# Patient Record
Sex: Female | Born: 1965 | Race: White | Hispanic: No | Smoking: Former smoker
Health system: Southern US, Community
[De-identification: ages and names within clinical notes are randomized; demographics above are authoritative.]

## PROBLEM LIST (undated history)

## (undated) DIAGNOSIS — K641 Second degree hemorrhoids: Secondary | ICD-10-CM

## (undated) DIAGNOSIS — E119 Type 2 diabetes mellitus without complications: Secondary | ICD-10-CM

## (undated) DIAGNOSIS — K589 Irritable bowel syndrome without diarrhea: Secondary | ICD-10-CM

## (undated) DIAGNOSIS — F419 Anxiety disorder, unspecified: Secondary | ICD-10-CM

## (undated) DIAGNOSIS — E559 Vitamin D deficiency, unspecified: Secondary | ICD-10-CM

## (undated) DIAGNOSIS — M25471 Effusion, right ankle: Secondary | ICD-10-CM

## (undated) DIAGNOSIS — M25475 Effusion, left foot: Secondary | ICD-10-CM

## (undated) DIAGNOSIS — E785 Hyperlipidemia, unspecified: Secondary | ICD-10-CM

## (undated) DIAGNOSIS — Z98811 Dental restoration status: Secondary | ICD-10-CM

## (undated) DIAGNOSIS — F411 Generalized anxiety disorder: Secondary | ICD-10-CM

## (undated) DIAGNOSIS — Z91018 Allergy to other foods: Secondary | ICD-10-CM

## (undated) DIAGNOSIS — M2550 Pain in unspecified joint: Secondary | ICD-10-CM

## (undated) DIAGNOSIS — M1990 Unspecified osteoarthritis, unspecified site: Secondary | ICD-10-CM

## (undated) DIAGNOSIS — K7469 Other cirrhosis of liver: Secondary | ICD-10-CM

## (undated) DIAGNOSIS — R110 Nausea: Secondary | ICD-10-CM

## (undated) DIAGNOSIS — K2970 Gastritis, unspecified, without bleeding: Secondary | ICD-10-CM

## (undated) DIAGNOSIS — G500 Trigeminal neuralgia: Secondary | ICD-10-CM

## (undated) DIAGNOSIS — E7800 Pure hypercholesterolemia, unspecified: Secondary | ICD-10-CM

## (undated) DIAGNOSIS — K219 Gastro-esophageal reflux disease without esophagitis: Secondary | ICD-10-CM

## (undated) DIAGNOSIS — R0602 Shortness of breath: Secondary | ICD-10-CM

## (undated) DIAGNOSIS — I10 Essential (primary) hypertension: Secondary | ICD-10-CM

## (undated) DIAGNOSIS — D369 Benign neoplasm, unspecified site: Secondary | ICD-10-CM

## (undated) DIAGNOSIS — R197 Diarrhea, unspecified: Secondary | ICD-10-CM

## (undated) DIAGNOSIS — K7581 Nonalcoholic steatohepatitis (NASH): Secondary | ICD-10-CM

## (undated) DIAGNOSIS — E282 Polycystic ovarian syndrome: Secondary | ICD-10-CM

## (undated) DIAGNOSIS — K5730 Diverticulosis of large intestine without perforation or abscess without bleeding: Secondary | ICD-10-CM

## (undated) DIAGNOSIS — E039 Hypothyroidism, unspecified: Secondary | ICD-10-CM

## (undated) DIAGNOSIS — Z87442 Personal history of urinary calculi: Secondary | ICD-10-CM

## (undated) DIAGNOSIS — T7840XA Allergy, unspecified, initial encounter: Secondary | ICD-10-CM

## (undated) DIAGNOSIS — Z8659 Personal history of other mental and behavioral disorders: Secondary | ICD-10-CM

## (undated) DIAGNOSIS — F603 Borderline personality disorder: Secondary | ICD-10-CM

## (undated) DIAGNOSIS — K5900 Constipation, unspecified: Secondary | ICD-10-CM

## (undated) DIAGNOSIS — K7460 Unspecified cirrhosis of liver: Secondary | ICD-10-CM

## (undated) DIAGNOSIS — F332 Major depressive disorder, recurrent severe without psychotic features: Secondary | ICD-10-CM

## (undated) DIAGNOSIS — C801 Malignant (primary) neoplasm, unspecified: Secondary | ICD-10-CM

## (undated) DIAGNOSIS — K0889 Other specified disorders of teeth and supporting structures: Secondary | ICD-10-CM

## (undated) DIAGNOSIS — R112 Nausea with vomiting, unspecified: Secondary | ICD-10-CM

## (undated) DIAGNOSIS — F319 Bipolar disorder, unspecified: Secondary | ICD-10-CM

## (undated) DIAGNOSIS — Z8669 Personal history of other diseases of the nervous system and sense organs: Secondary | ICD-10-CM

## (undated) DIAGNOSIS — N979 Female infertility, unspecified: Secondary | ICD-10-CM

## (undated) DIAGNOSIS — G4730 Sleep apnea, unspecified: Secondary | ICD-10-CM

## (undated) DIAGNOSIS — R002 Palpitations: Secondary | ICD-10-CM

## (undated) DIAGNOSIS — F329 Major depressive disorder, single episode, unspecified: Secondary | ICD-10-CM

## (undated) DIAGNOSIS — L439 Lichen planus, unspecified: Secondary | ICD-10-CM

## (undated) DIAGNOSIS — Z9889 Other specified postprocedural states: Secondary | ICD-10-CM

## (undated) DIAGNOSIS — K760 Fatty (change of) liver, not elsewhere classified: Secondary | ICD-10-CM

## (undated) DIAGNOSIS — M549 Dorsalgia, unspecified: Secondary | ICD-10-CM

## (undated) DIAGNOSIS — R5383 Other fatigue: Secondary | ICD-10-CM

## (undated) DIAGNOSIS — M25474 Effusion, right foot: Secondary | ICD-10-CM

## (undated) DIAGNOSIS — D649 Anemia, unspecified: Secondary | ICD-10-CM

## (undated) DIAGNOSIS — M25472 Effusion, left ankle: Secondary | ICD-10-CM

## (undated) DIAGNOSIS — F32A Depression, unspecified: Secondary | ICD-10-CM

## (undated) HISTORY — DX: Allergy, unspecified, initial encounter: T78.40XA

## (undated) HISTORY — DX: Female infertility, unspecified: N97.9

## (undated) HISTORY — DX: Nonalcoholic steatohepatitis (NASH): K75.81

## (undated) HISTORY — DX: Generalized anxiety disorder: F41.1

## (undated) HISTORY — DX: Diverticulosis of large intestine without perforation or abscess without bleeding: K57.30

## (undated) HISTORY — DX: Shortness of breath: R06.02

## (undated) HISTORY — DX: Gastritis, unspecified, without bleeding: K29.70

## (undated) HISTORY — DX: Lichen planus, unspecified: L43.9

## (undated) HISTORY — DX: Vitamin D deficiency, unspecified: E55.9

## (undated) HISTORY — DX: Unspecified osteoarthritis, unspecified site: M19.90

## (undated) HISTORY — DX: Hypothyroidism, unspecified: E03.9

## (undated) HISTORY — DX: Polycystic ovarian syndrome: E28.2

## (undated) HISTORY — DX: Effusion, left ankle: M25.472

## (undated) HISTORY — DX: Diarrhea, unspecified: R19.7

## (undated) HISTORY — DX: Malignant (primary) neoplasm, unspecified: C80.1

## (undated) HISTORY — DX: Other cirrhosis of liver: K74.69

## (undated) HISTORY — DX: Dorsalgia, unspecified: M54.9

## (undated) HISTORY — DX: Fatty (change of) liver, not elsewhere classified: K76.0

## (undated) HISTORY — DX: Effusion, left foot: M25.475

## (undated) HISTORY — DX: Type 2 diabetes mellitus without complications: E11.9

## (undated) HISTORY — DX: Pain in unspecified joint: M25.50

## (undated) HISTORY — DX: Major depressive disorder, recurrent severe without psychotic features: F33.2

## (undated) HISTORY — DX: Borderline personality disorder: F60.3

## (undated) HISTORY — DX: Other fatigue: R53.83

## (undated) HISTORY — DX: Effusion, right ankle: M25.471

## (undated) HISTORY — PX: APPENDECTOMY: SHX54

## (undated) HISTORY — DX: Constipation, unspecified: K59.00

## (undated) HISTORY — DX: Other specified disorders of teeth and supporting structures: K08.89

## (undated) HISTORY — DX: Gastro-esophageal reflux disease without esophagitis: K21.9

## (undated) HISTORY — DX: Anemia, unspecified: D64.9

## (undated) HISTORY — DX: Nausea: R11.0

## (undated) HISTORY — DX: Second degree hemorrhoids: K64.1

## (undated) HISTORY — DX: Effusion, right foot: M25.474

## (undated) HISTORY — DX: Benign neoplasm, unspecified site: D36.9

## (undated) HISTORY — PX: JOINT REPLACEMENT: SHX530

## (undated) HISTORY — DX: Trigeminal neuralgia: G50.0

## (undated) HISTORY — DX: Irritable bowel syndrome, unspecified: K58.9

## (undated) HISTORY — DX: Allergy to other foods: Z91.018

## (undated) HISTORY — DX: Hyperlipidemia, unspecified: E78.5

## (undated) HISTORY — PX: OTHER SURGICAL HISTORY: SHX169

## (undated) HISTORY — PX: SPINE SURGERY: SHX786

## (undated) HISTORY — PX: RIGHT OOPHORECTOMY: SHX2359

## (undated) HISTORY — PX: LUMBAR LAMINECTOMY: SHX95

## (undated) HISTORY — DX: Palpitations: R00.2

## (undated) HISTORY — DX: Unspecified cirrhosis of liver: K74.60

## (undated) HISTORY — DX: Personal history of other mental and behavioral disorders: Z86.59

---
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
## 1992-08-05 HISTORY — PX: PILONIDAL CYST EXCISION: SHX744

## 1996-08-05 HISTORY — PX: OVARIAN CYST SURGERY: SHX726

## 1997-12-28 ENCOUNTER — Ambulatory Visit (HOSPITAL_COMMUNITY): Admission: RE | Admit: 1997-12-28 | Discharge: 1997-12-28 | Payer: Self-pay | Admitting: Interventional Cardiology

## 1998-04-20 ENCOUNTER — Emergency Department (HOSPITAL_COMMUNITY): Admission: EM | Admit: 1998-04-20 | Discharge: 1998-04-20 | Payer: Self-pay | Admitting: Emergency Medicine

## 1998-04-20 ENCOUNTER — Encounter: Payer: Self-pay | Admitting: Emergency Medicine

## 1999-03-03 ENCOUNTER — Emergency Department (HOSPITAL_COMMUNITY): Admission: EM | Admit: 1999-03-03 | Discharge: 1999-03-03 | Payer: Self-pay | Admitting: Emergency Medicine

## 1999-03-03 ENCOUNTER — Encounter: Payer: Self-pay | Admitting: Emergency Medicine

## 1999-04-19 ENCOUNTER — Other Ambulatory Visit: Admission: RE | Admit: 1999-04-19 | Discharge: 1999-04-19 | Payer: Self-pay | Admitting: Obstetrics and Gynecology

## 1999-07-07 ENCOUNTER — Encounter: Payer: Self-pay | Admitting: Emergency Medicine

## 1999-07-07 ENCOUNTER — Emergency Department (HOSPITAL_COMMUNITY): Admission: EM | Admit: 1999-07-07 | Discharge: 1999-07-07 | Payer: Self-pay | Admitting: Emergency Medicine

## 1999-09-05 ENCOUNTER — Inpatient Hospital Stay (HOSPITAL_COMMUNITY): Admission: RE | Admit: 1999-09-05 | Discharge: 1999-09-07 | Payer: Self-pay | Admitting: Obstetrics and Gynecology

## 1999-09-05 ENCOUNTER — Encounter (INDEPENDENT_AMBULATORY_CARE_PROVIDER_SITE_OTHER): Payer: Self-pay | Admitting: Specialist

## 2000-10-10 ENCOUNTER — Other Ambulatory Visit: Admission: RE | Admit: 2000-10-10 | Discharge: 2000-10-10 | Payer: Self-pay | Admitting: Obstetrics and Gynecology

## 2001-07-31 ENCOUNTER — Encounter: Payer: Self-pay | Admitting: Family Medicine

## 2001-07-31 ENCOUNTER — Ambulatory Visit (HOSPITAL_COMMUNITY): Admission: RE | Admit: 2001-07-31 | Discharge: 2001-07-31 | Payer: Self-pay | Admitting: Family Medicine

## 2001-08-05 HISTORY — PX: TOTAL THYROIDECTOMY: SHX2547

## 2002-02-19 ENCOUNTER — Ambulatory Visit (HOSPITAL_COMMUNITY): Admission: RE | Admit: 2002-02-19 | Discharge: 2002-02-19 | Payer: Self-pay | Admitting: Endocrinology

## 2002-02-19 ENCOUNTER — Encounter: Payer: Self-pay | Admitting: Endocrinology

## 2002-03-03 ENCOUNTER — Encounter (INDEPENDENT_AMBULATORY_CARE_PROVIDER_SITE_OTHER): Payer: Self-pay | Admitting: *Deleted

## 2002-03-03 ENCOUNTER — Ambulatory Visit (HOSPITAL_COMMUNITY): Admission: RE | Admit: 2002-03-03 | Discharge: 2002-03-03 | Payer: Self-pay | Admitting: Endocrinology

## 2002-03-03 ENCOUNTER — Encounter: Payer: Self-pay | Admitting: Endocrinology

## 2002-04-28 ENCOUNTER — Encounter: Payer: Self-pay | Admitting: Emergency Medicine

## 2002-04-29 ENCOUNTER — Encounter: Payer: Self-pay | Admitting: Emergency Medicine

## 2002-04-29 ENCOUNTER — Inpatient Hospital Stay (HOSPITAL_COMMUNITY): Admission: EM | Admit: 2002-04-29 | Discharge: 2002-04-29 | Payer: Self-pay | Admitting: Emergency Medicine

## 2002-07-13 ENCOUNTER — Other Ambulatory Visit (HOSPITAL_COMMUNITY): Admission: RE | Admit: 2002-07-13 | Discharge: 2002-07-14 | Payer: Self-pay | Admitting: Psychiatry

## 2002-09-02 ENCOUNTER — Encounter: Payer: Self-pay | Admitting: *Deleted

## 2002-09-02 ENCOUNTER — Encounter: Admission: RE | Admit: 2002-09-02 | Discharge: 2002-09-02 | Payer: Self-pay | Admitting: *Deleted

## 2003-02-01 ENCOUNTER — Other Ambulatory Visit: Admission: RE | Admit: 2003-02-01 | Discharge: 2003-02-01 | Payer: Self-pay | Admitting: Obstetrics and Gynecology

## 2003-08-03 ENCOUNTER — Ambulatory Visit (HOSPITAL_COMMUNITY): Admission: RE | Admit: 2003-08-03 | Discharge: 2003-08-03 | Payer: Self-pay | Admitting: Obstetrics and Gynecology

## 2003-08-13 ENCOUNTER — Emergency Department (HOSPITAL_COMMUNITY): Admission: EM | Admit: 2003-08-13 | Discharge: 2003-08-13 | Payer: Self-pay | Admitting: Emergency Medicine

## 2004-02-02 ENCOUNTER — Other Ambulatory Visit: Admission: RE | Admit: 2004-02-02 | Discharge: 2004-02-02 | Payer: Self-pay | Admitting: Obstetrics and Gynecology

## 2005-05-21 ENCOUNTER — Encounter: Admission: RE | Admit: 2005-05-21 | Discharge: 2005-05-21 | Payer: Self-pay | Admitting: Obstetrics and Gynecology

## 2005-08-05 HISTORY — PX: ACHILLES TENDON SURGERY: SHX542

## 2006-02-13 ENCOUNTER — Emergency Department (HOSPITAL_COMMUNITY): Admission: EM | Admit: 2006-02-13 | Discharge: 2006-02-13 | Payer: Self-pay | Admitting: Emergency Medicine

## 2006-05-23 ENCOUNTER — Encounter: Admission: RE | Admit: 2006-05-23 | Discharge: 2006-05-23 | Payer: Self-pay | Admitting: *Deleted

## 2006-05-28 ENCOUNTER — Other Ambulatory Visit: Admission: RE | Admit: 2006-05-28 | Discharge: 2006-05-28 | Payer: Self-pay | Admitting: Obstetrics and Gynecology

## 2007-05-25 ENCOUNTER — Encounter: Admission: RE | Admit: 2007-05-25 | Discharge: 2007-05-25 | Payer: Self-pay | Admitting: Obstetrics and Gynecology

## 2007-06-25 ENCOUNTER — Other Ambulatory Visit: Admission: RE | Admit: 2007-06-25 | Discharge: 2007-06-25 | Payer: Self-pay | Admitting: Obstetrics and Gynecology

## 2007-07-25 ENCOUNTER — Emergency Department (HOSPITAL_COMMUNITY): Admission: EM | Admit: 2007-07-25 | Discharge: 2007-07-25 | Payer: Self-pay | Admitting: Family Medicine

## 2007-08-27 ENCOUNTER — Encounter: Payer: Self-pay | Admitting: Endocrinology

## 2007-09-01 ENCOUNTER — Encounter: Payer: Self-pay | Admitting: Endocrinology

## 2007-09-21 ENCOUNTER — Encounter: Admission: RE | Admit: 2007-09-21 | Discharge: 2007-12-08 | Payer: Self-pay | Admitting: Family Medicine

## 2007-10-02 ENCOUNTER — Ambulatory Visit: Payer: Self-pay | Admitting: Endocrinology

## 2007-10-02 DIAGNOSIS — G43909 Migraine, unspecified, not intractable, without status migrainosus: Secondary | ICD-10-CM | POA: Insufficient documentation

## 2007-10-02 DIAGNOSIS — Z87442 Personal history of urinary calculi: Secondary | ICD-10-CM

## 2007-10-02 DIAGNOSIS — I10 Essential (primary) hypertension: Secondary | ICD-10-CM | POA: Insufficient documentation

## 2007-10-02 DIAGNOSIS — E890 Postprocedural hypothyroidism: Secondary | ICD-10-CM | POA: Insufficient documentation

## 2007-10-02 DIAGNOSIS — R7309 Other abnormal glucose: Secondary | ICD-10-CM | POA: Insufficient documentation

## 2007-10-02 DIAGNOSIS — Z87448 Personal history of other diseases of urinary system: Secondary | ICD-10-CM | POA: Insufficient documentation

## 2007-10-02 DIAGNOSIS — E282 Polycystic ovarian syndrome: Secondary | ICD-10-CM

## 2007-10-02 DIAGNOSIS — E7800 Pure hypercholesterolemia, unspecified: Secondary | ICD-10-CM | POA: Insufficient documentation

## 2007-10-02 DIAGNOSIS — F329 Major depressive disorder, single episode, unspecified: Secondary | ICD-10-CM

## 2007-10-02 HISTORY — DX: Essential (primary) hypertension: I10

## 2007-10-02 HISTORY — DX: Personal history of urinary calculi: Z87.442

## 2007-10-02 HISTORY — DX: Postprocedural hypothyroidism: E89.0

## 2007-10-02 HISTORY — DX: Migraine, unspecified, not intractable, without status migrainosus: G43.909

## 2007-10-02 HISTORY — DX: Major depressive disorder, single episode, unspecified: F32.9

## 2007-10-02 HISTORY — DX: Polycystic ovarian syndrome: E28.2

## 2007-10-08 ENCOUNTER — Encounter: Payer: Self-pay | Admitting: Endocrinology

## 2007-10-23 ENCOUNTER — Ambulatory Visit: Payer: Self-pay | Admitting: Endocrinology

## 2007-10-26 LAB — CONVERTED CEMR LAB
Hgb A1c MFr Bld: 5.1 % (ref 4.6–6.0)
TSH: 0.27 microintl units/mL — ABNORMAL LOW (ref 0.35–5.50)

## 2007-12-13 ENCOUNTER — Emergency Department (HOSPITAL_COMMUNITY): Admission: EM | Admit: 2007-12-13 | Discharge: 2007-12-13 | Payer: Self-pay | Admitting: Emergency Medicine

## 2008-01-12 ENCOUNTER — Telehealth: Payer: Self-pay | Admitting: Endocrinology

## 2008-06-27 ENCOUNTER — Encounter: Admission: RE | Admit: 2008-06-27 | Discharge: 2008-06-27 | Payer: Self-pay | Admitting: Obstetrics and Gynecology

## 2008-07-12 ENCOUNTER — Ambulatory Visit: Payer: Self-pay | Admitting: Obstetrics and Gynecology

## 2008-07-12 ENCOUNTER — Encounter: Payer: Self-pay | Admitting: Obstetrics and Gynecology

## 2008-07-12 ENCOUNTER — Other Ambulatory Visit: Admission: RE | Admit: 2008-07-12 | Discharge: 2008-07-12 | Payer: Self-pay | Admitting: Obstetrics and Gynecology

## 2008-11-08 ENCOUNTER — Telehealth: Payer: Self-pay | Admitting: Endocrinology

## 2009-02-01 ENCOUNTER — Telehealth: Payer: Self-pay | Admitting: Endocrinology

## 2009-02-16 ENCOUNTER — Ambulatory Visit: Payer: Self-pay | Admitting: Endocrinology

## 2009-02-16 LAB — CONVERTED CEMR LAB: TSH: 0.38 microintl units/mL (ref 0.35–5.50)

## 2009-02-20 ENCOUNTER — Telehealth: Payer: Self-pay | Admitting: Endocrinology

## 2009-06-12 ENCOUNTER — Telehealth: Payer: Self-pay | Admitting: Endocrinology

## 2010-02-07 ENCOUNTER — Ambulatory Visit: Payer: Self-pay | Admitting: Endocrinology

## 2010-02-08 ENCOUNTER — Ambulatory Visit: Payer: Self-pay | Admitting: Endocrinology

## 2010-02-08 DIAGNOSIS — Z87891 Personal history of nicotine dependence: Secondary | ICD-10-CM

## 2010-02-08 HISTORY — DX: Personal history of nicotine dependence: Z87.891

## 2010-02-20 ENCOUNTER — Observation Stay (HOSPITAL_COMMUNITY): Admission: EM | Admit: 2010-02-20 | Discharge: 2010-02-21 | Payer: Self-pay | Admitting: Emergency Medicine

## 2010-02-20 ENCOUNTER — Ambulatory Visit: Payer: Self-pay | Admitting: Cardiology

## 2010-02-21 ENCOUNTER — Encounter (INDEPENDENT_AMBULATORY_CARE_PROVIDER_SITE_OTHER): Payer: Self-pay | Admitting: Emergency Medicine

## 2010-03-30 ENCOUNTER — Telehealth: Payer: Self-pay | Admitting: Endocrinology

## 2010-06-01 ENCOUNTER — Encounter: Admission: RE | Admit: 2010-06-01 | Discharge: 2010-06-01 | Payer: Self-pay | Admitting: Obstetrics and Gynecology

## 2010-07-02 ENCOUNTER — Other Ambulatory Visit: Admission: RE | Admit: 2010-07-02 | Discharge: 2010-07-02 | Payer: Self-pay | Admitting: Obstetrics and Gynecology

## 2010-07-02 ENCOUNTER — Ambulatory Visit: Payer: Self-pay | Admitting: Obstetrics and Gynecology

## 2010-08-17 ENCOUNTER — Inpatient Hospital Stay: Payer: Self-pay | Admitting: Unknown Physician Specialty

## 2010-08-26 ENCOUNTER — Encounter: Payer: Self-pay | Admitting: Obstetrics and Gynecology

## 2010-08-27 ENCOUNTER — Ambulatory Visit: Payer: Self-pay | Admitting: Unknown Physician Specialty

## 2010-09-05 ENCOUNTER — Ambulatory Visit: Payer: Self-pay | Admitting: Unknown Physician Specialty

## 2010-09-06 NOTE — Progress Notes (Signed)
Summary: metformin  Phone Note Refill Request Message from:  Fax from Pharmacy on March 30, 2010 8:29 AM  Refills Requested: Medication #1:  METFORMIN HCL 750 MG  TB24 take 1 by mouth qd   Dosage confirmed as above?Dosage Confirmed  Method Requested: Electronic Initial call taken by: Brenton Grills MA,  March 30, 2010 8:29 AM    Prescriptions: METFORMIN HCL 750 MG  TB24 (METFORMIN HCL) take 1 by mouth qd  #90 x 1   Entered by:   Brenton Grills MA   Authorized by:   Minus Breeding MD   Signed by:   Brenton Grills MA on 03/30/2010   Method used:   Electronically to        [facility]* (retail)       [address]       [address] Shipping/mailing       [place], [place]  [address]       Ph: [phone]       Fax: [phone]   RxID:   [id number]

## 2010-09-06 NOTE — Assessment & Plan Note (Signed)
Summary: follow up-lb   Vital Signs:  Patient profile:   45 year old female Height:      71 inches (180.34 cm) Weight:      325.75 pounds (148.07 kg) BMI:     45.60 O2 Sat:      97 % on Room air Temp:     98.8 degrees F (37.11 degrees C) oral Pulse rate:   73 / minute BP sitting:   94 / 62  (left arm) Cuff size:   large  Vitals Entered By: Brenton Grills MA (February 08, 2010 8:13 AM)  O2 Flow:  Room air CC: yearly f/u/hypothyroid/pt states she is no longer taking amlodipine/aj   Primary Provider:  Dr Octaviano Glow  CC:  yearly f/u/hypothyroid/pt states she is no longer taking amlodipine/aj.  History of Present Illness: the status of at least 3 ongoing medical problems is addressed today: hypothyroidism:  pt states she feels well in general, and takes the synthroid as rx'ed. htn:  since she stopped norvasc, she denies dizziness. hyperglycemia:  no nausea with metformin.  she has quit smoking  Current Medications (verified): 1)  Amlodipine Besylate 10 Mg  Tabs (Amlodipine Besylate) .... Take 1 By Mouth Qd 2)  Synthroid 137 Mcg  Tabs (Levothyroxine Sodium) .... Take 2 By Mouth Qd 3)  Metformin Hcl 750 Mg  Tb24 (Metformin Hcl) .... Take 1 By Mouth Qd 4)  Alprazolam 0.25 Mg  Tabs (Alprazolam) .... Take 1 By Mouth Three Times A Day Prn 5)  Lisinopril-Hydrochlorothiazide 20-12.5 Mg Tabs (Lisinopril-Hydrochlorothiazide) .Marland Kitchen.. 1 Tab Qd 6)  Pristiq 100 Mg Xr24h-Tab (Desvenlafaxine Succinate) .Marland Kitchen.. 1 Tab Qam 7)  Lithium Carbonate 450 Mg Cr-Tabs (Lithium Carbonate) .Marland Kitchen.. 1 Tablet By Mouth Two Times A Day 8)  Abilify 5 Mg Tabs (Aripiprazole) .Marland Kitchen.. 1 Tablet By Mouth At Bedtime 9)  Zantac 150 Mg Tabs (Ranitidine Hcl) .... Take 1 Tablet By Mouth Every Morning 10)  Aspirin 81 Mg Tabs (Aspirin) .Marland Kitchen.. 1 Tablet By Mouth Every Morning  Allergies (verified): 1)  ! Codeine 2)  ! Wellbutrin 3)  ! 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         she says lithium is driving weight gain.  Physical Exam  General:  morbidly obese.  no distress  Neck:  a healed scar is present.  i do not appreciate a nodule in the thyroid or elsewhere in the neck  Skin:  moderate hirsutism on the face Additional Exam:  FastTSH                   1.10 uIU/mL                 0.35-5.50 Hemoglobin A1C            5.4 %      Impression & Recommendations:  Problem # 1:  HYPOTHYROIDISM, POSTSURGICAL (ICD-244.0) after thyroidectomy at [facility] in 2003--benign well-replaced  Problem # 2:  HYPERTENSION (ICD-401.9) overcontrolled  Problem # 3:  HYPERGLYCEMIA (ICD-790.29) Assessment: Unchanged  Medications Added to Medication List This Visit: 1)  Lisinopril-hydrochlorothiazide 20-12.5 Mg Tabs (Lisinopril-hydrochlorothiazide) .... 1/2 tab once daily 2)  Lithium Carbonate 450 Mg Cr-tabs (Lithium carbonate) .Marland Kitchen.. 1 tablet by mouth two times a day 3)  Abilify 5 Mg Tabs (Aripiprazole) .Marland Kitchen.. 1 tablet by mouth at bedtime 4)  Zantac 150 Mg Tabs (Ranitidine hcl) .... Take 1 tablet by mouth every morning 5)  Aspirin 81 Mg Tabs (Aspirin) .Marland Kitchen.. 1 tablet by mouth every morning  Other Orders: Surgical Referral (Surgery) TLB-TSH (Thyroid Stimulating Hormone) (84443-TSH) TLB-A1C / Hgb A1C (Glycohemoglobin) (83036-A1C) Est. Patient Level IV ([address])  Patient Instructions: 1)  reduce lisinopril-hctz to 1/2 pill per day. 2)  please see dr sun in a few weeks to follow-up your blood pressure. 3)  blood tests are being ordered for you today.  please call [phone] to hear your test results. 4)  Congratulations on quitting smoking!. 5)  Please schedule a follow-up  appointment in 1 year. 6)  you should consider weight-loss surgery.  please let me know if you wish to be referred for an informational meeting. 7)  cc dr Evelene Croon 8)  (update: i left message on phone-tree:  rx as we discussed) Prescriptions: LISINOPRIL-HYDROCHLOROTHIAZIDE 20-12.5 MG TABS (LISINOPRIL-HYDROCHLOROTHIAZIDE) 1/2 tab once daily  #30 x 2   Entered and Authorized by:   Minus Breeding MD   Signed by:   Minus Breeding MD on 02/08/2010   Method used:   Electronically to        [facility]* (retail)       [address]       [address] Shipping/mailing       [place], [place]  [address]       Ph: [phone]       Fax: [phone]   RxID:   [id number]

## 2010-10-20 LAB — POCT I-STAT, CHEM 8
BUN: 11 mg/dL (ref 6–23)
Calcium, Ion: 1.12 mmol/L (ref 1.12–1.32)
Creatinine, Ser: 0.8 mg/dL (ref 0.4–1.2)
Glucose, Bld: 77 mg/dL (ref 70–99)
TCO2: 25 mmol/L (ref 0–100)

## 2010-10-20 LAB — POCT CARDIAC MARKERS
CKMB, poc: 1.8 ng/mL (ref 1.0–8.0)
Myoglobin, poc: 107 ng/mL (ref 12–200)
Myoglobin, poc: 84.9 ng/mL (ref 12–200)
Troponin i, poc: <0.05 ng/mL (ref 0.00–0.09)

## 2010-10-20 LAB — DIFFERENTIAL
Basophils Relative: 0 % (ref 0–1)
Eosinophils Absolute: 0.3 10*3/uL (ref 0.0–0.7)
Eosinophils Relative: 4 % (ref 0–5)
Monocytes Absolute: 0.5 10*3/uL (ref 0.1–1.0)
Monocytes Relative: 6 % (ref 3–12)

## 2010-10-20 LAB — CBC
HCT: 40.7 % (ref 36.0–46.0)
Hemoglobin: 13.9 g/dL (ref 12.0–15.0)
MCH: 30.7 pg (ref 26.0–34.0)
MCHC: 34.1 g/dL (ref 30.0–36.0)
RDW: 15.2 % (ref 11.5–15.5)

## 2010-11-06 ENCOUNTER — Other Ambulatory Visit: Payer: Self-pay | Admitting: Endocrinology

## 2010-11-06 DIAGNOSIS — E890 Postprocedural hypothyroidism: Secondary | ICD-10-CM

## 2010-11-06 MED ORDER — LEVOTHYROXINE SODIUM 137 MCG PO TABS: ORAL_TABLET | ORAL | Status: DC

## 2010-11-06 NOTE — Telephone Encounter (Signed)
R'cd fax from [facility] for refill for pt's Levothyroxine.  Last OV-02/08/2010  Last Filled: 07/25/2010

## 2010-12-11 ENCOUNTER — Other Ambulatory Visit: Payer: Self-pay | Admitting: *Deleted

## 2010-12-11 MED ORDER — LISINOPRIL-HYDROCHLOROTHIAZIDE 20-12.5 MG PO TABS: ORAL_TABLET | ORAL | Status: DC

## 2010-12-11 NOTE — Telephone Encounter (Signed)
R'cd fax from [facility] for refill of pt's Lisinopril-HCTZ rx.  Last Filled-09/28/2010  Last OV-02/08/2010

## 2010-12-21 NOTE — Discharge Summary (Signed)
NAME:  SELENY, Tracy [name]                       ACCOUNT NO.:  [id number]   MEDICAL RECORD NO.:  [id number]                   PATIENT TYPE:  INP   LOCATION:  0348                                 FACILITY:  [facility]   PHYSICIAN:  Sherin Quarry, MD                   DATE OF BIRTH:  [birth year]   DATE OF ADMISSION:  04/28/2002  DATE OF DISCHARGE:  04/29/2002                                 DISCHARGE SUMMARY   HISTORY OF PRESENT ILLNESS:  The patient is a 45 year old lady who presented  to the emergency room on April 28, 2002, with a several day history of  nausea, an episode of emesis and diarrhea as well as left-sided chest  discomfort.  This was not associated with dyspnea.  The patient also  complained of right upper quadrant abdominal pain and of back pain.   PAST MEDICAL HISTORY:  Her past history is remarkable for polycystic ovary  syndrome, depression and thyroidectomy.   PHYSICAL EXAMINATION:  Her physical examination at the time of admission by  Dr. Jonell Cluck revealed:  VITAL SIGNS:  Blood pressure 126/80, pulse 100, respiratory rate 20.  HEENT:  Within normal limits.  CHEST:  Clear.  CARDIOVASCULAR:  Normal S1 and S2 without murmurs, rubs, or gallops.  ABDOMEN:  Benign, normal bowel sounds without masses, tenderness or  organomegaly.  EXTREMITIES:  Normal.  NEUROLOGIC:  Normal.   LABORATORY DATA:  Relevant studies obtained included abdominal ultrasound  which was negative.  The patient also had CT scan of the chest, abdomen and  pelvis which was negative.   EKG which was normal.   Serial troponins which were negative.   HOSPITAL COURSE:  On April 29, 2002, the patient continued to have  complaints of discomfort in the chest and abdomen but said that it would be  her preference to go home.  As she had had a stress test two years ago as  well as and echocardiogram  and as I felt the risk of a cardiac problem was  low, we decided mutually that we would go ahead  and make an appointment for  her to see Dr. Katrinka Blazing back in the office for follow-up rather than having a  cardiology consult in the hospital.  On April 29, 2002, the patient was  discharged.   DISCHARGE DIAGNOSES:  1. Atypical chest pain.  2. Gastroenteritis.  3. Polycystic ovary syndrome.  4. Depression.  5. Status post thyroidectomy.   DISCHARGE MEDICATIONS:  The patient was advised to continue her usual  medications which include trazodone, Glucophage, Effexor, Yasmin , Topamax,  and Synthroid.  She was also given a prescription for Ultram 50 mg to take  p.r.n. for pain.  She was advised to continue Protonix 40 mg b.i.d.   FOLLOW UP:  The patient's will follow up with Dr. Verdis Prime on Friday,  May 07, 2002, and with Dr. Merri Brunette in 10  to 14 days.                                               Sherin Quarry, MD    SY/MEDQ  D:  04/29/2002  T:  04/29/2002  Job:  [id number]   cc:   Lyn Records III, M.D.  [address]  [address]  [place]  [place] [address]  Fax: [phone]   Dario Guardian, M.D.

## 2011-01-10 ENCOUNTER — Other Ambulatory Visit: Payer: Self-pay | Admitting: Endocrinology

## 2011-03-18 ENCOUNTER — Other Ambulatory Visit: Payer: Self-pay | Admitting: Endocrinology

## 2011-04-18 ENCOUNTER — Other Ambulatory Visit: Payer: Self-pay | Admitting: Endocrinology

## 2011-05-20 ENCOUNTER — Other Ambulatory Visit: Payer: Self-pay | Admitting: Endocrinology

## 2011-06-24 ENCOUNTER — Other Ambulatory Visit: Payer: Self-pay | Admitting: Obstetrics and Gynecology

## 2011-06-24 DIAGNOSIS — Z1231 Encounter for screening mammogram for malignant neoplasm of breast: Secondary | ICD-10-CM

## 2011-07-04 ENCOUNTER — Ambulatory Visit (INDEPENDENT_AMBULATORY_CARE_PROVIDER_SITE_OTHER): Payer: Commercial Managed Care - PPO | Admitting: Endocrinology

## 2011-07-04 ENCOUNTER — Encounter: Payer: Self-pay | Admitting: Endocrinology

## 2011-07-04 ENCOUNTER — Other Ambulatory Visit (INDEPENDENT_AMBULATORY_CARE_PROVIDER_SITE_OTHER): Payer: Commercial Managed Care - PPO

## 2011-07-04 DIAGNOSIS — R7309 Other abnormal glucose: Secondary | ICD-10-CM

## 2011-07-04 DIAGNOSIS — I10 Essential (primary) hypertension: Secondary | ICD-10-CM

## 2011-07-04 DIAGNOSIS — E890 Postprocedural hypothyroidism: Secondary | ICD-10-CM

## 2011-07-04 LAB — BASIC METABOLIC PANEL
BUN: 13 mg/dL (ref 6–23)
Chloride: 106 mEq/L (ref 96–112)
GFR: 72.4 mL/min (ref >60.00)
Glucose, Bld: 72 mg/dL (ref 70–99)
Potassium: 3.8 mEq/L (ref 3.5–5.1)
Sodium: 140 mEq/L (ref 135–145)

## 2011-07-04 MED ORDER — LEVOTHYROXINE SODIUM 150 MCG PO TABS: ORAL_TABLET | ORAL | Status: DC

## 2011-07-04 MED ORDER — METFORMIN HCL ER 750 MG PO TB24: 750.0000 mg | ORAL_TABLET | Freq: Every day | ORAL | Status: DC

## 2011-07-04 MED ORDER — LISINOPRIL-HYDROCHLOROTHIAZIDE 20-12.5 MG PO TABS: ORAL_TABLET | ORAL | Status: DC

## 2011-07-04 NOTE — Patient Instructions (Addendum)
blood tests are being requested for you today.  please call [phone] to hear your test results.  You will be prompted to enter the 9-digit "MRN" number that appears at the top left of this page, followed by #.  Then you will hear the message. pending the test results, please continue the same medications for now. Please return in 1 year. Cc dr Evelene Croon (update: i left message on phone-tree:  Increase synthroid to 2x150/d.   come back for tsh in 4-6 weeks).

## 2011-07-04 NOTE — Progress Notes (Signed)
  Subjective:    Patient ID: Tracy [name], female    DOB: Jul 19, 1966, 45 y.o.   MRN: [id number]  HPI Pt had thyroidectomy at [facility] in 2003--pathol was benign.  She takes synthroid as rx'ed.  Denies sob and weight change Past Medical History  Diagnosis Date  . DEPRESSION 10/02/2007  . HYPERGLYCEMIA 10/02/2007  . HYPERLIPIDEMIA, TYPE IV 10/02/2007  . HYPERTENSION 10/02/2007  . HYPOTHYROIDISM, POSTSURGICAL 10/02/2007  . MIGRAINE HEADACHE 10/02/2007  . POLYCYSTIC OVARIAN DISEASE 10/02/2007  . RENAL CALCULUS, HX OF 10/02/2007  . TOBACCO USE, QUIT 02/08/2010    Past Surgical History  Procedure Date  . Appendectomy   . Tubal ligation   . Total thyroidectomy   . Ovarian cyst surgery   . Lumbar laminectomy     History   Social History  . Marital Status: Married    Spouse Name: N/A    Number of Children: N/A  . Years of Education: N/A   Occupational History  . Nurse    Social History Main Topics  . Smoking status: Never Smoker   . Smokeless tobacco: Not on file  . Alcohol Use: Not on file  . Drug Use: Not on file  . Sexually Active: Not on file   Other Topics Concern  . Not on file   Social History Narrative  . No narrative on file    Current Outpatient Prescriptions on File Prior to Visit  Medication Sig Dispense Refill  . ALPRAZolam (XANAX) 0.25 MG tablet Take 0.25 mg by mouth 3 (three) times daily as needed.        . ARIPiprazole (ABILIFY) 5 MG tablet Take 5 mg by mouth at bedtime.        Marland Kitchen aspirin 81 MG tablet Take 81 mg by mouth every morning.        . ranitidine (ZANTAC) 150 MG tablet Take 150 mg by mouth as needed.         Allergies  Allergen Reactions  . Bupropion Hcl   . Codeine   . Sulfamethoxazole W/Trimethoprim     Family History  Problem Relation Age of Onset  . Thyroid disease Neg Hx    BP 118/72  Pulse 73  Temp(Src) 98.2 F (36.8 C) (Oral)  Ht 5\' 11"  (1.803 m)  Wt 334 lb 8 oz (151.728 kg)  BMI 46.65 kg/m2  SpO2 96%  LMP  06/10/2011  Review of Systems Denies cough    Objective:   Physical Exam VITAL SIGNS:  See vs page GENERAL: no distress Face; moderate hirsutism Neck: a healed scar is present.  i do not appreciate a nodule in the thyroid or elsewhere in the neck LUNGS:  Clear to auscultation HEART:  Regular rate and rhythm without murmurs noted. Normal S1,S2.    Lab Results  Component Value Date   TSH 10.25* 07/04/2011   Lab Results  Component Value Date   HGBA1C 5.7 07/04/2011      Assessment & Plan:  Postsurgical hypothyroidism, needs increased rx. Hyperglycemia, stable.

## 2011-08-07 DIAGNOSIS — N979 Female infertility, unspecified: Secondary | ICD-10-CM | POA: Insufficient documentation

## 2011-08-07 DIAGNOSIS — D369 Benign neoplasm, unspecified site: Secondary | ICD-10-CM | POA: Insufficient documentation

## 2011-08-07 DIAGNOSIS — E282 Polycystic ovarian syndrome: Secondary | ICD-10-CM | POA: Insufficient documentation

## 2011-08-14 ENCOUNTER — Ambulatory Visit: Payer: Commercial Managed Care - PPO | Admitting: Obstetrics and Gynecology

## 2011-08-14 ENCOUNTER — Ambulatory Visit (INDEPENDENT_AMBULATORY_CARE_PROVIDER_SITE_OTHER): Payer: Commercial Managed Care - PPO

## 2011-08-14 ENCOUNTER — Other Ambulatory Visit: Payer: Commercial Managed Care - PPO

## 2011-08-14 ENCOUNTER — Ambulatory Visit (INDEPENDENT_AMBULATORY_CARE_PROVIDER_SITE_OTHER): Payer: Commercial Managed Care - PPO | Admitting: Obstetrics and Gynecology

## 2011-08-14 ENCOUNTER — Other Ambulatory Visit: Payer: Self-pay | Admitting: Obstetrics and Gynecology

## 2011-08-14 DIAGNOSIS — D3910 Neoplasm of uncertain behavior of unspecified ovary: Secondary | ICD-10-CM

## 2011-08-14 DIAGNOSIS — D279 Benign neoplasm of unspecified ovary: Secondary | ICD-10-CM

## 2011-08-14 DIAGNOSIS — N831 Corpus luteum cyst of ovary, unspecified side: Secondary | ICD-10-CM

## 2011-08-14 NOTE — Progress Notes (Signed)
Patient came back to see me today for followup of bilateral ovarian masses. On ultrasound she has an anteverted uterus with a homogeneous echo pattern. Her endometrial echo is 2.4 mm. Her right ovary is now normal with the previous mass resolved. Her left ovary still shows a solid mass of 2.2 cm which is unchanged from November of 2011. There is blood flow within the mass. Her cul-de-sac is free of fluid.  Assessment: Solid left ovarian neoplasm  Plan: Once again told the patient that I could not guarantee that this is benign. However its stability mitigates against malignancy. Patient does not want to proceed with surgery and  I think is reasonable. She will make an appointment for annual exam.

## 2011-12-25 ENCOUNTER — Encounter (HOSPITAL_COMMUNITY): Payer: Self-pay | Admitting: Emergency Medicine

## 2011-12-25 ENCOUNTER — Emergency Department (HOSPITAL_COMMUNITY): Payer: PRIVATE HEALTH INSURANCE

## 2011-12-25 ENCOUNTER — Emergency Department (HOSPITAL_COMMUNITY)
Admission: EM | Admit: 2011-12-25 | Discharge: 2011-12-25 | Disposition: A | Discharge status: Home / Self Care | Payer: PRIVATE HEALTH INSURANCE | Attending: Emergency Medicine | Admitting: Emergency Medicine

## 2011-12-25 DIAGNOSIS — Y9210 Unspecified residential institution as the place of occurrence of the external cause: Secondary | ICD-10-CM | POA: Insufficient documentation

## 2011-12-25 DIAGNOSIS — I10 Essential (primary) hypertension: Secondary | ICD-10-CM | POA: Insufficient documentation

## 2011-12-25 DIAGNOSIS — S060XAA Concussion with loss of consciousness status unknown, initial encounter: Secondary | ICD-10-CM | POA: Insufficient documentation

## 2011-12-25 DIAGNOSIS — Z79899 Other long term (current) drug therapy: Secondary | ICD-10-CM | POA: Insufficient documentation

## 2011-12-25 DIAGNOSIS — S060X9A Concussion with loss of consciousness of unspecified duration, initial encounter: Secondary | ICD-10-CM | POA: Insufficient documentation

## 2011-12-25 DIAGNOSIS — E785 Hyperlipidemia, unspecified: Secondary | ICD-10-CM | POA: Insufficient documentation

## 2011-12-25 MED ORDER — HYDROCODONE-ACETAMINOPHEN 5-500 MG PO TABS: 1.0000 | ORAL_TABLET | Freq: Four times a day (QID) | ORAL | Status: AC | PRN

## 2011-12-25 MED ORDER — ACETAMINOPHEN 325 MG PO TABS
650.0000 mg | ORAL_TABLET | Freq: Once | ORAL | Status: AC
  Administered 2011-12-25: 650 mg via ORAL
  Filled 2011-12-25: qty 2

## 2011-12-25 MED ORDER — ONDANSETRON HCL 4 MG PO TABS: 4.0000 mg | ORAL_TABLET | Freq: Four times a day (QID) | ORAL | Status: AC

## 2011-12-25 MED ORDER — ONDANSETRON HCL 4 MG/2ML IJ SOLN
4.0000 mg | Freq: Once | INTRAMUSCULAR | Status: AC
  Administered 2011-12-25: 4 mg via INTRAVENOUS
  Filled 2011-12-25 (×2): qty 2
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
## 2011-12-25 NOTE — ED Notes (Signed)
Patient transported to CT 

## 2011-12-25 NOTE — ED Notes (Signed)
Patient is AOx4 and comfortable with her discharge instructions. 

## 2011-12-25 NOTE — Discharge Instructions (Signed)
Concussion and Brain Injury A blow or jolt to the head can disrupt the normal function of the brain. This type of brain injury is often called a "concussion" or a "closed head injury." Concussions are usually not life-threatening. Even so, the effects of a concussion can be serious.  CAUSES  A concussion is caused by a blunt blow to the head. The blow might be direct or indirect as described below.  Direct blow (running into another player during a soccer game, being hit in a fight, or hitting your head on a hard surface).   Indirect blow (when your head moves rapidly and violently back and forth like in a car crash).  SYMPTOMS  The brain is very complex. Every head injury is different. Some symptoms may appear right away. Other symptoms may not show up for days or weeks after the concussion. The signs of concussion can be hard to notice. Early on, problems may be missed by patients, family members, and caregivers. You may look fine even though you are acting or feeling differently.  These symptoms are usually temporary, but may last for days, weeks, or even longer. Symptoms include:  Mild headaches that will not go away.   Having more trouble than usual with:   Remembering things.   Paying attention or concentrating.   Organizing daily tasks.   Making decisions and solving problems.   Slowness in thinking, acting, speaking, or reading.   Getting lost or easily confused.   Feeling tired all the time or lacking energy (fatigue).   Feeling drowsy.   Sleep disturbances.   Sleeping more than usual.   Sleeping less than usual.   Trouble falling asleep.   Trouble sleeping (insomnia).   Loss of balance or feeling lightheaded or dizzy.   Nausea or vomiting.   Numbness or tingling.   Increased sensitivity to:   Sounds.   Lights.   Distractions.  Other symptoms might include:  Vision problems or eyes that tire easily.   Diminished sense of taste or smell.   Ringing  in the ears.   Mood changes such as feeling sad, anxious, or listless.   Becoming easily irritated or angry for little or no reason.   Lack of motivation.  DIAGNOSIS  Your caregiver can usually diagnose a concussion or mild brain injury based on your description of your injury and your symptoms.  Your evaluation might include:  A brain scan to look for signs of injury to the brain. Even if the test shows no injury, you may still have a concussion.   Blood tests to be sure other problems are not present.  TREATMENT   People with a concussion need to be examined and evaluated. Most people with concussions are treated in an emergency department, urgent care, or clinic. Some people must stay in the hospital overnight for further treatment.   Your caregiver will send you home with important instructions to follow. Be sure to carefully follow them.   Tell your caregiver if you are already taking any medicines (prescription, over-the-counter, or natural remedies), or if you are drinking alcohol or taking illegal drugs. Also, talk with your caregiver if you are taking blood thinners (anticoagulants) or aspirin. These drugs may increase your chances of complications. All of this is important information that may affect treatment.   Only take over-the-counter or prescription medicines for pain, discomfort, or fever as directed by your caregiver.  PROGNOSIS  How fast people recover from brain injury varies from person to person.   Although most people have a good recovery, how quickly they improve depends on many factors. These factors include how severe their concussion was, what part of the brain was injured, their age, and how healthy they were before the concussion.  Because all head injuries are different, so is recovery. Most people with mild injuries recover fully. Recovery can take time. In general, recovery is slower in older persons. Also, persons who have had a concussion in the past or have  other medical problems may find that it takes longer to recover from their current injury. Anxiety and depression may also make it harder to adjust to the symptoms of brain injury. HOME CARE INSTRUCTIONS  Return to your normal activities slowly, not all at once. You must give your body and brain enough time for recovery.  Get plenty of sleep at night, and rest during the day. Rest helps the brain to heal.   Avoid staying up late at night.   Keep the same bedtime hours on weekends and weekdays.   Take daytime naps or rest breaks when you feel tired.   Limit activities that require a lot of thought or concentration (brain or cognitive rest). This includes:   Homework or job-related work.   Watching TV.   Computer work.   Avoid activities that could lead to a second brain injury, such as contact or recreational sports, until your caregiver says it is okay. Even after your brain injury has healed, you should protect yourself from having another concussion.   Ask your caregiver when you can return to your normal activities such as driving, bicycling, or operating heavy equipment. Your ability to react may be slower after a brain injury.   Talk with your caregiver about when you can return to work or school.   Inform your teachers, school nurse, school counselor, coach, Product/process development scientist, or work Freight forwarder about your injury, symptoms, and restrictions. They should be instructed to report:   Increased problems with attention or concentration.   Increased problems remembering or learning new information.   Increased time needed to complete tasks or assignments.   Increased irritability or decreased ability to cope with stress.   Increased symptoms.   Take only those medicines that your caregiver has approved.   Do not drink alcohol until your caregiver says you are well enough to do so. Alcohol and certain other drugs may slow your recovery and can put you at risk of further injury.    If it is harder than usual to remember things, write them down.   If you are easily distracted, try to do one thing at a time. For example, do not try to watch TV while fixing dinner.   Talk with family members or close friends when making important decisions.   Keep all follow-up appointments. Repeated evaluation of your symptoms is recommended for your recovery.  PREVENTION  Protect your head from future injury. It is very important to avoid another head or brain injury before you have recovered. In rare cases, another injury has lead to permanent brain damage, brain swelling, or death. Avoid injuries by using:  Seatbelts when riding in a car.   Alcohol only in moderation.   A helmet when biking, skiing, skateboarding, skating, or doing similar activities.   Safety measures in your home.   Remove clutter and tripping hazards from floors and stairways.   Use grab bars in bathrooms and handrails by stairs.   Place non-slip mats on floors and in bathtubs.  Improve lighting in dim areas.  SEEK MEDICAL CARE IF:  A head injury can cause lingering symptoms. You should seek medical care if you have any of the following symptoms for more than 3 weeks after your injury or are planning to return to sports:  Chronic headaches.   Dizziness or balance problems.   Nausea.   Vision problems.   Increased sensitivity to noise or light.   Depression or mood swings.   Anxiety or irritability.   Memory problems.   Difficulty concentrating or paying attention.   Sleep problems.   Feeling tired all the time.  SEEK IMMEDIATE MEDICAL CARE IF:  You have had a blow or jolt to the head and you (or your family or friends) notice:  Severe or worsening headaches.   Weakness (even if only in one hand or one leg or one part of the face), numbness, or decreased coordination.   Repeated vomiting.   Increased sleepiness or passing out.   One black center of the eye (pupil) is larger  than the other.   Convulsions (seizures).   Slurred speech.   Increasing confusion, restlessness, agitation, or irritability.   Lack of ability to recognize people or places.   Neck pain.   Difficulty being awakened.   Unusual behavior changes.   Loss of consciousness.  Older adults with a brain injury may have a higher risk of serious complications such as a blood clot on the brain. Headaches that get worse or an increase in confusion are signs of this complication. If these signs occur, see a caregiver right away. MAKE SURE YOU:   Understand these instructions.   Will watch your condition.   Will get help right away if you are not doing well or get worse.  FOR MORE INFORMATION  Several groups help people with brain injury and their families. They provide information and put people in touch with local resources. These include support groups, rehabilitation services, and a variety of health care professionals. Among these groups, the Brain Injury Association (BIA, www.biausa.org) has a Secretary/administrator that gathers scientific and educational information and works on a national level to help people with brain injury.   Follow up with your physician regarding CT findings and to schedule outpatient MRI.

## 2012-01-01 ENCOUNTER — Encounter: Payer: Self-pay | Admitting: Endocrinology

## 2012-01-01 ENCOUNTER — Ambulatory Visit (INDEPENDENT_AMBULATORY_CARE_PROVIDER_SITE_OTHER): Payer: 59 | Admitting: Endocrinology

## 2012-01-01 VITALS — BP 102/62 | HR 78 | Temp 97.7°F | Ht 71.0 in | Wt 334.0 lb

## 2012-01-01 DIAGNOSIS — E237 Disorder of pituitary gland, unspecified: Secondary | ICD-10-CM

## 2012-01-01 HISTORY — DX: Disorder of pituitary gland, unspecified: E23.7

## 2012-01-01 MED ORDER — LISINOPRIL-HYDROCHLOROTHIAZIDE 10-12.5 MG PO TABS: ORAL_TABLET | ORAL | Status: DC

## 2012-01-01 MED ORDER — DEXAMETHASONE 1 MG PO TABS: ORAL_TABLET | ORAL | Status: DC
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
## 2012-01-01 NOTE — Patient Instructions (Addendum)
blood tests are being requested for you today.  You will receive a letter with results. you should do a "dexamethasone suppression test."  for this, you would take dexamethasone 1 mg at 10 pm, then come in for a "cortisol" blood test the next morning before 9 am.  you do not need to be fasting for this test. Reduce the lisinopril-hctz to 10/12.5, 1/2 tab daily. Please see dr sun to f/u your blood pressure.   On this blood-pressure medication, you should avoid pregnancy. Please return in 1 year, when we'll plan to do an mri of the pituitary.

## 2012-01-03 ENCOUNTER — Other Ambulatory Visit (INDEPENDENT_AMBULATORY_CARE_PROVIDER_SITE_OTHER): Payer: 59

## 2012-01-03 DIAGNOSIS — E890 Postprocedural hypothyroidism: Secondary | ICD-10-CM

## 2012-01-03 DIAGNOSIS — E237 Disorder of pituitary gland, unspecified: Secondary | ICD-10-CM

## 2012-01-03 LAB — TSH: TSH: 3.47 u[IU]/mL (ref 0.35–5.50)

## 2012-05-21 ENCOUNTER — Telehealth: Payer: Self-pay | Admitting: *Deleted

## 2012-05-21 ENCOUNTER — Inpatient Hospital Stay (HOSPITAL_COMMUNITY): Payer: 59

## 2012-05-21 ENCOUNTER — Inpatient Hospital Stay (HOSPITAL_COMMUNITY)
Admission: AD | Admit: 2012-05-21 | Discharge: 2012-05-21 | Disposition: A | Discharge status: Home / Self Care | Payer: 59 | Source: Ambulatory Visit | Attending: Obstetrics & Gynecology | Admitting: Obstetrics & Gynecology

## 2012-05-21 ENCOUNTER — Encounter (HOSPITAL_COMMUNITY): Payer: Self-pay | Admitting: *Deleted

## 2012-05-21 DIAGNOSIS — R1032 Left lower quadrant pain: Secondary | ICD-10-CM | POA: Insufficient documentation

## 2012-05-21 DIAGNOSIS — N949 Unspecified condition associated with female genital organs and menstrual cycle: Secondary | ICD-10-CM | POA: Insufficient documentation

## 2012-05-21 DIAGNOSIS — N83209 Unspecified ovarian cyst, unspecified side: Secondary | ICD-10-CM | POA: Insufficient documentation

## 2012-05-21 LAB — URINALYSIS, ROUTINE W REFLEX MICROSCOPIC
Bilirubin Urine: NEGATIVE
Glucose, UA: NEGATIVE mg/dL
Hgb urine dipstick: NEGATIVE
Protein, ur: NEGATIVE mg/dL
Urobilinogen, UA: 0.2 mg/dL (ref 0.0–1.0)

## 2012-05-21 LAB — POCT PREGNANCY, URINE: Preg Test, Ur: NEGATIVE

## 2012-05-21 MED ORDER — KETOROLAC TROMETHAMINE 60 MG/2ML IM SOLN
60.0000 mg | Freq: Once | INTRAMUSCULAR | Status: AC
  Administered 2012-05-21: 60 mg via INTRAMUSCULAR
  Filled 2012-05-21: qty 2

## 2012-05-21 NOTE — Telephone Encounter (Signed)
Pt calling c/o sharp left side pelvic pain, pain started about 1 week ago and stopped by returned this am. LMP: august 2013 pt said normal for her to skip periods. Pt has taken tylenol OTC, but very little relief. She had surgery on her leg back in summer and has some percocet that she take for pain if needed, but has not taken yet. She lived about 45-50 minutes away. I told her OV would be best unable to come due to the late phone call. She asked what do you want her to do? Please advise

## 2012-05-21 NOTE — Telephone Encounter (Signed)
If she still having pain tomorrow she should come to the office and  see one of the physicians. I will not be here but I am sure someone  will see her.

## 2012-05-21 NOTE — MAU Note (Signed)
Pt presents with complaints of left pelvic pain that started a few weeks ago and thought she was getting ready to start her cycle and she has never started and states now it is a stabbing pain in her pelvis.

## 2012-05-21 NOTE — MAU Provider Note (Signed)
History     CSN: [id number]  Arrival date and time: 05/21/12 1027   First Provider Initiated Contact with Patient 05/21/12 1916      Chief Complaint  Patient presents with  . Pelvic Pain   HPI Pt is not pregnant and is pt of [facility].  Pt called the office and said she was having pain.  Dr. Hyacinth Meeker called to alert [place] of pt's arrival.  Pt states that she started having left lower quadrant pain on and off for a couple of weeks with onset sharp stabbing  pain this morning associated with some nausea.  The pt went to the bathroom to try to have a bowel movement.  With much straining, pt had a little diarrhea with no relief of pain and probable increase in pain.  Pt has a complicated history of PCOS, cystic teratoma and also history of right ovarian torsion with right salpingectomy in 1984.  This pain feels similar to the pain she experienced with the torsion.  Pt's pain has subsided some at this time.  Pt denies fever, chills, UTI symptoms.  Past Medical History  Diagnosis Date  . DEPRESSION 10/02/2007  . HYPERGLYCEMIA 10/02/2007  . HYPERLIPIDEMIA, TYPE IV 10/02/2007  . HYPOTHYROIDISM, POSTSURGICAL 10/02/2007  . MIGRAINE HEADACHE 10/02/2007  . POLYCYSTIC OVARIAN DISEASE 10/02/2007  . RENAL CALCULUS, HX OF 10/02/2007  . TOBACCO USE, QUIT 02/08/2010  . Hypothyroidism   . HYPERTENSION 10/02/2007  . Cystic teratoma     BENIGN  . PCOS (polycystic ovarian syndrome)   . Infertility, female     Past Surgical History  Procedure Date  . Appendectomy   . Tubal ligation   . Total thyroidectomy 2003  . Ovarian cyst surgery     LEFT  . Lumbar laminectomy     X 3  . Salpingectomy 1984    RIGHT TORSION  . Achilles tendon surgery 2007    Family History  Problem Relation Age of Onset  . Breast cancer Mother   . Hypertension Mother   . Heart disease Father   . Cancer Brother     THROAT    History  Substance Use Topics  . Smoking status: Never Smoker   . Smokeless tobacco: Not on file    . Alcohol Use: No    Allergies:  Allergies  Allergen Reactions  . Bupropion Hcl Hives and Itching    WELLBUTRIN  . Codeine Itching  . Septra (Sulfamethoxazole W/Trimethoprim (Co-Trimoxazole)) Hives and Itching    Prescriptions prior to admission  Medication Sig Dispense Refill  . ARIPiprazole (ABILIFY) 5 MG tablet Take 5 mg by mouth daily.       Marland Kitchen dexamethasone (DECADRON) 1 MG tablet Take at 10 pm, the night before blood test  1 tablet  0  . levothyroxine (SYNTHROID, LEVOTHROID) 150 MCG tablet Take 300 mcg by mouth daily.      Marland Kitchen lisinopril-hydrochlorothiazide (PRINZIDE,ZESTORETIC) 10-12.5 MG per tablet 1/2 tab daily  45 tablet  3  . metFORMIN (GLUCOPHAGE-XR) 750 MG 24 hr tablet Take 1 tablet (750 mg total) by mouth daily with breakfast.  90 tablet  3  . naproxen sodium (ANAPROX) 220 MG tablet Take 220 mg by mouth 2 (two) times daily with a meal.      . ranitidine (ZANTAC) 150 MG tablet Take 150 mg by mouth daily.       . selegiline (EMSAM) 6 MG/24HR Place 1 patch onto the skin daily.          ROS Physical  Exam   Blood pressure 125/69, pulse 90, resp. rate 18, height 5\' 11"  (1.803 m), weight 147.419 kg (325 lb), last menstrual period 02/19/2012.  Physical Exam  USE: Findings:  Uterus: The uterus measures 7.9 x 3.4 x 5.1 cm. No myometrial mass  lesions. Cysts in the cervical region consistent with Nabothian  cysts.  Endometrium: Normal endometrial stripe thickness, measured at 4.5  mm. No abnormal endometrial fluid collections.  Right ovary: The right ovary measures 2.7 x 1.5 x 2.3 cm. Normal  follicular changes are demonstrated. Flow is demonstrated in the  right ovary on color flow Doppler imaging.  Left ovary: The left ovary measures 5.3 x 2.9 x 3.6 cm. There is a  complex septated cystic structure in the left ovary which measures  3.4 x 2 x 2.8 cm. There is a hypoechoic solid appearing nodule in  the left ovary which measures 2.2 x 2 x 1.9 cm. Changes are   nonspecific. This could represent cystic and solid ovarian mass,  endometrioma, or complex and hemorrhagic cysts. Flow is  demonstrated in the left ovary suggesting no evidence of torsion.  Follow-up ultrasound in 8-12 weeks is recommended to assess for any  interval change.  Other findings: Small amount of free fluid in the cul-de-sac.  IMPRESSION:  Normal appearance of the uterus and right ovary. Complex cystic  and solid appearing masses in the left ovary. Follow-up ultrasound  in 8-12 weeks is recommended to assess for interval change.  Original Report Authenticated By: Marlon Pel, M.D.   MAU Course  Procedures Pt given Toradol 60 mg IM with satisfactory response in pain Care turned over to Thressa Sheller, CNM  2215: Dr. Hyacinth Meeker notified of ultrasound results, and no evidence of torsion. Pt is OK to go home, and she will have the office call the schedule a follow up appointment with her.   Assessment and Plan   1. Ovarian cyst   pt has pain medication available at home to take PRN Will FU with Dr. Jeanella Craze to return to MAU if SX worsen or do not improve  LINEBERRY,SUSAN 05/21/2012, 7:17 PM

## 2012-05-22 NOTE — Telephone Encounter (Signed)
Tell patient there was a cyst on  her left ovary. Office visit if pain continues. If pain resolves follow up ultrasound at office in 8 weeks.

## 2012-05-22 NOTE — Telephone Encounter (Addendum)
Spoke with pt husband and pt went to [facility] hospital and they did a ultrasound(in epic). Husband said pt pain level was at a 2 after returning from hospital.

## 2012-05-22 NOTE — Telephone Encounter (Signed)
Spoke with pt husband regarding the below, he will relay to patient.

## 2012-12-08 ENCOUNTER — Other Ambulatory Visit: Payer: Self-pay | Admitting: Endocrinology

## 2012-12-23 ENCOUNTER — Other Ambulatory Visit: Payer: Self-pay | Admitting: *Deleted

## 2012-12-23 ENCOUNTER — Other Ambulatory Visit: Payer: Self-pay | Admitting: Endocrinology

## 2012-12-23 MED ORDER — LISINOPRIL-HYDROCHLOROTHIAZIDE 10-12.5 MG PO TABS: ORAL_TABLET | ORAL | Status: DC

## 2013-02-01 ENCOUNTER — Other Ambulatory Visit (HOSPITAL_COMMUNITY): Payer: Self-pay | Admitting: Orthopedic Surgery

## 2013-02-01 DIAGNOSIS — M25571 Pain in right ankle and joints of right foot: Secondary | ICD-10-CM

## 2013-02-02 ENCOUNTER — Ambulatory Visit (HOSPITAL_COMMUNITY)
Admission: RE | Admit: 2013-02-02 | Discharge: 2013-02-02 | Disposition: A | Discharge status: Home / Self Care | Payer: 59 | Source: Ambulatory Visit | Attending: Orthopedic Surgery | Admitting: Orthopedic Surgery

## 2013-02-02 DIAGNOSIS — X58XXXA Exposure to other specified factors, initial encounter: Secondary | ICD-10-CM | POA: Insufficient documentation

## 2013-02-02 DIAGNOSIS — S93499A Sprain of other ligament of unspecified ankle, initial encounter: Secondary | ICD-10-CM | POA: Insufficient documentation

## 2013-02-02 DIAGNOSIS — M25571 Pain in right ankle and joints of right foot: Secondary | ICD-10-CM

## 2013-02-18 ENCOUNTER — Encounter (HOSPITAL_BASED_OUTPATIENT_CLINIC_OR_DEPARTMENT_OTHER): Payer: Self-pay | Admitting: *Deleted

## 2013-02-18 NOTE — Pre-Procedure Instructions (Signed)
To come for BMET and EKG 

## 2013-02-18 NOTE — Progress Notes (Signed)
02/18/13 1206  OBSTRUCTIVE SLEEP APNEA  Have you ever been diagnosed with sleep apnea through a sleep study? No  Do you snore loudly (loud enough to be heard through closed doors)?  1  Do you often feel tired, fatigued, or sleepy during the daytime? 1  Has anyone observed you stop breathing during your sleep? 1  Do you have, or are you being treated for high blood pressure? 1  BMI more than 35 kg/m2? 1  Age over 45 years old? 0  Gender: 0  Obstructive Sleep Apnea Score 5  Score 4 or greater  Results sent to PCP (Dr. Deatra James)

## 2013-02-19 NOTE — Pre-Procedure Instructions (Signed)
History of emergence delirium, BMI, sleep apnea score discussed with Dr. Michelle Piper; pt. OK to come for surgery.

## 2013-02-24 ENCOUNTER — Other Ambulatory Visit: Payer: Self-pay

## 2013-02-24 ENCOUNTER — Encounter (HOSPITAL_BASED_OUTPATIENT_CLINIC_OR_DEPARTMENT_OTHER)
Admission: RE | Admit: 2013-02-24 | Discharge: 2013-02-24 | Disposition: A | Discharge status: Home / Self Care | Payer: 59 | Source: Ambulatory Visit | Attending: Orthopedic Surgery | Admitting: Orthopedic Surgery

## 2013-02-24 ENCOUNTER — Other Ambulatory Visit: Payer: Self-pay | Admitting: Orthopedic Surgery

## 2013-02-24 LAB — BASIC METABOLIC PANEL
BUN: 11 mg/dL (ref 6–23)
Creatinine, Ser: 0.71 mg/dL (ref 0.50–1.10)
GFR calc non Af Amer: >90 mL/min (ref >90)
Glucose, Bld: 148 mg/dL — ABNORMAL HIGH (ref 70–99)
Potassium: 3.6 mEq/L (ref 3.5–5.1)

## 2013-02-25 ENCOUNTER — Encounter (HOSPITAL_BASED_OUTPATIENT_CLINIC_OR_DEPARTMENT_OTHER)
Admission: RE | Disposition: A | Discharge status: Home / Self Care | Payer: Self-pay | Source: Ambulatory Visit | Attending: Orthopedic Surgery

## 2013-02-25 ENCOUNTER — Ambulatory Visit (HOSPITAL_BASED_OUTPATIENT_CLINIC_OR_DEPARTMENT_OTHER)
Admission: RE | Admit: 2013-02-25 | Discharge: 2013-02-25 | Disposition: A | Discharge status: Home / Self Care | Payer: 59 | Source: Ambulatory Visit | Attending: Orthopedic Surgery | Admitting: Orthopedic Surgery

## 2013-02-25 ENCOUNTER — Encounter (HOSPITAL_BASED_OUTPATIENT_CLINIC_OR_DEPARTMENT_OTHER): Payer: Self-pay | Admitting: Certified Registered Nurse Anesthetist

## 2013-02-25 ENCOUNTER — Ambulatory Visit (HOSPITAL_BASED_OUTPATIENT_CLINIC_OR_DEPARTMENT_OTHER): Payer: 59 | Admitting: Certified Registered Nurse Anesthetist

## 2013-02-25 DIAGNOSIS — M6240 Contracture of muscle, unspecified site: Secondary | ICD-10-CM | POA: Insufficient documentation

## 2013-02-25 DIAGNOSIS — I10 Essential (primary) hypertension: Secondary | ICD-10-CM | POA: Insufficient documentation

## 2013-02-25 DIAGNOSIS — E7800 Pure hypercholesterolemia, unspecified: Secondary | ICD-10-CM | POA: Insufficient documentation

## 2013-02-25 DIAGNOSIS — F411 Generalized anxiety disorder: Secondary | ICD-10-CM | POA: Insufficient documentation

## 2013-02-25 DIAGNOSIS — M129 Arthropathy, unspecified: Secondary | ICD-10-CM | POA: Insufficient documentation

## 2013-02-25 DIAGNOSIS — M6520 Calcific tendinitis, unspecified site: Secondary | ICD-10-CM | POA: Insufficient documentation

## 2013-02-25 DIAGNOSIS — E039 Hypothyroidism, unspecified: Secondary | ICD-10-CM | POA: Insufficient documentation

## 2013-02-25 DIAGNOSIS — M249 Joint derangement, unspecified: Secondary | ICD-10-CM | POA: Insufficient documentation

## 2013-02-25 DIAGNOSIS — M24176 Other articular cartilage disorders, unspecified foot: Secondary | ICD-10-CM | POA: Insufficient documentation

## 2013-02-25 DIAGNOSIS — F17200 Nicotine dependence, unspecified, uncomplicated: Secondary | ICD-10-CM | POA: Insufficient documentation

## 2013-02-25 DIAGNOSIS — E282 Polycystic ovarian syndrome: Secondary | ICD-10-CM | POA: Insufficient documentation

## 2013-02-25 DIAGNOSIS — M7660 Achilles tendinitis, unspecified leg: Secondary | ICD-10-CM | POA: Insufficient documentation

## 2013-02-25 DIAGNOSIS — Z882 Allergy status to sulfonamides status: Secondary | ICD-10-CM | POA: Insufficient documentation

## 2013-02-25 DIAGNOSIS — M7661 Achilles tendinitis, right leg: Secondary | ICD-10-CM

## 2013-02-25 DIAGNOSIS — M216X9 Other acquired deformities of unspecified foot: Secondary | ICD-10-CM | POA: Insufficient documentation

## 2013-02-25 DIAGNOSIS — K219 Gastro-esophageal reflux disease without esophagitis: Secondary | ICD-10-CM | POA: Insufficient documentation

## 2013-02-25 DIAGNOSIS — Z79899 Other long term (current) drug therapy: Secondary | ICD-10-CM | POA: Insufficient documentation

## 2013-02-25 DIAGNOSIS — Z885 Allergy status to narcotic agent status: Secondary | ICD-10-CM | POA: Insufficient documentation

## 2013-02-25 DIAGNOSIS — Z888 Allergy status to other drugs, medicaments and biological substances status: Secondary | ICD-10-CM | POA: Insufficient documentation

## 2013-02-25 HISTORY — DX: Nausea with vomiting, unspecified: R11.2

## 2013-02-25 HISTORY — DX: Personal history of urinary calculi: Z87.442

## 2013-02-25 HISTORY — DX: Dental restoration status: Z98.811

## 2013-02-25 HISTORY — PX: EXCISION HAGLUND'S DEFORMITY WITH ACHILLES TENDON REPAIR: SHX5627

## 2013-02-25 HISTORY — DX: Essential (primary) hypertension: I10

## 2013-02-25 HISTORY — DX: Personal history of other diseases of the nervous system and sense organs: Z86.69

## 2013-02-25 HISTORY — DX: Depression, unspecified: F32.A

## 2013-02-25 HISTORY — DX: Pure hypercholesterolemia, unspecified: E78.00

## 2013-02-25 HISTORY — DX: Nausea with vomiting, unspecified: Z98.890

## 2013-02-25 HISTORY — DX: Unspecified osteoarthritis, unspecified site: M19.90

## 2013-02-25 HISTORY — DX: Anxiety disorder, unspecified: F41.9

## 2013-02-25 HISTORY — DX: Gastro-esophageal reflux disease without esophagitis: K21.9

## 2013-02-25 HISTORY — DX: Major depressive disorder, single episode, unspecified: F32.9

## 2013-02-25 LAB — POCT HEMOGLOBIN-HEMACUE: Hemoglobin: 14.9 g/dL (ref 12.0–15.0)

## 2013-02-25 LAB — GLUCOSE, CAPILLARY: Glucose-Capillary: 134 mg/dL — ABNORMAL HIGH (ref 70–99)

## 2013-02-25 SURGERY — EXCISION HAGLUND'S DEFORMITY WITH ACHILLES TENDON REPAIR
Anesthesia: Regional | Site: Foot | Laterality: Right | Wound class: Clean

## 2013-02-25 MED ORDER — CEFAZOLIN SODIUM-DEXTROSE 2-3 GM-% IV SOLR
2.0000 g | INTRAVENOUS | Status: AC
  Administered 2013-02-25 (×2): 3 g via INTRAVENOUS

## 2013-02-25 MED ORDER — SUCCINYLCHOLINE CHLORIDE 20 MG/ML IJ SOLN
INTRAMUSCULAR | Status: DC | PRN
  Administered 2013-02-25: 100 mg via INTRAVENOUS

## 2013-02-25 MED ORDER — LIDOCAINE HCL (CARDIAC) 20 MG/ML IV SOLN
INTRAVENOUS | Status: DC | PRN
  Administered 2013-02-25: 30 mg via INTRAVENOUS

## 2013-02-25 MED ORDER — ONDANSETRON HCL 4 MG/2ML IJ SOLN
INTRAMUSCULAR | Status: DC | PRN
  Administered 2013-02-25: 4 mg via INTRAVENOUS

## 2013-02-25 MED ORDER — MIDAZOLAM HCL 5 MG/5ML IJ SOLN
INTRAMUSCULAR | Status: DC | PRN
  Administered 2013-02-25: 2 mg via INTRAVENOUS

## 2013-02-25 MED ORDER — SODIUM CHLORIDE 0.9 % IV SOLN: INTRAVENOUS | Status: DC

## 2013-02-25 MED ORDER — 0.9 % SODIUM CHLORIDE (POUR BTL) OPTIME
TOPICAL | Status: DC | PRN
  Administered 2013-02-25: 300 mL

## 2013-02-25 MED ORDER — OXYCODONE HCL 5 MG PO TABS: 5.0000 mg | ORAL_TABLET | Freq: Once | ORAL | Status: DC | PRN

## 2013-02-25 MED ORDER — ASPIRIN EC 325 MG PO TBEC: 325.0000 mg | DELAYED_RELEASE_TABLET | Freq: Every day | ORAL | Status: DC

## 2013-02-25 MED ORDER — LACTATED RINGERS IV SOLN
INTRAVENOUS | Status: DC
  Administered 2013-02-25 (×2): via INTRAVENOUS

## 2013-02-25 MED ORDER — PROPOFOL 10 MG/ML IV BOLUS
INTRAVENOUS | Status: DC | PRN
  Administered 2013-02-25: 260 mg via INTRAVENOUS

## 2013-02-25 MED ORDER — CHLORHEXIDINE GLUCONATE 4 % EX LIQD: 60.0000 mL | Freq: Once | CUTANEOUS | Status: DC

## 2013-02-25 MED ORDER — MIDAZOLAM HCL 2 MG/2ML IJ SOLN
1.0000 mg | INTRAMUSCULAR | Status: DC | PRN
  Administered 2013-02-25: 2 mg via INTRAVENOUS

## 2013-02-25 MED ORDER — OXYCODONE HCL 5 MG PO TABS: 5.0000 mg | ORAL_TABLET | ORAL | Status: DC | PRN

## 2013-02-25 MED ORDER — MIDAZOLAM HCL 2 MG/ML PO SYRP: 12.0000 mg | ORAL_SOLUTION | Freq: Once | ORAL | Status: DC | PRN

## 2013-02-25 MED ORDER — FENTANYL CITRATE 0.05 MG/ML IJ SOLN
INTRAMUSCULAR | Status: DC | PRN
  Administered 2013-02-25 (×2): 50 ug via INTRAVENOUS
  Administered 2013-02-25: 25 ug via INTRAVENOUS

## 2013-02-25 MED ORDER — SCOPOLAMINE 1 MG/3DAYS TD PT72
1.0000 | MEDICATED_PATCH | TRANSDERMAL | Status: DC
  Administered 2013-02-25: 1.5 mg via TRANSDERMAL

## 2013-02-25 MED ORDER — DEXAMETHASONE SODIUM PHOSPHATE 4 MG/ML IJ SOLN
INTRAMUSCULAR | Status: DC | PRN
  Administered 2013-02-25: 4 mg via INTRAVENOUS

## 2013-02-25 MED ORDER — HYDROMORPHONE HCL PF 1 MG/ML IJ SOLN
0.2500 mg | INTRAMUSCULAR | Status: DC | PRN
  Administered 2013-02-25: 0.5 mg via INTRAVENOUS

## 2013-02-25 MED ORDER — BACITRACIN ZINC 500 UNIT/GM EX OINT
TOPICAL_OINTMENT | CUTANEOUS | Status: DC | PRN
  Administered 2013-02-25: 1 via TOPICAL

## 2013-02-25 MED ORDER — OXYCODONE HCL 5 MG/5ML PO SOLN: 5.0000 mg | Freq: Once | ORAL | Status: DC | PRN

## 2013-02-25 MED ORDER — ONDANSETRON HCL 4 MG/2ML IJ SOLN: 4.0000 mg | Freq: Once | INTRAMUSCULAR | Status: DC | PRN

## 2013-02-25 MED ORDER — FENTANYL CITRATE 0.05 MG/ML IJ SOLN
50.0000 ug | INTRAMUSCULAR | Status: DC | PRN
  Administered 2013-02-25: 100 ug via INTRAVENOUS

## 2013-02-25 MED ORDER — BUPIVACAINE-EPINEPHRINE PF 0.5-1:200000 % IJ SOLN
INTRAMUSCULAR | Status: DC | PRN
  Administered 2013-02-25: 30 mL

## 2013-02-25 SURGICAL SUPPLY — 73 items
BANDAGE ESMARK 6X9 LF (GAUZE/BANDAGES/DRESSINGS) ×1 IMPLANT
BLADE AVERAGE 25X9 (BLADE) ×2 IMPLANT
BLADE SURG 15 STRL LF DISP TIS (BLADE) ×2 IMPLANT
BLADE SURG 15 STRL SS (BLADE) ×6
BNDG CMPR 9X6 STRL LF SNTH (GAUZE/BANDAGES/DRESSINGS) ×1
BNDG COHESIVE 4X5 TAN STRL (GAUZE/BANDAGES/DRESSINGS) ×3 IMPLANT
BNDG COHESIVE 6X5 TAN STRL LF (GAUZE/BANDAGES/DRESSINGS) ×2 IMPLANT
BNDG ESMARK 6X9 LF (GAUZE/BANDAGES/DRESSINGS) ×2
CANISTER SUCTION 1200CC (MISCELLANEOUS) ×1 IMPLANT
CHLORAPREP W/TINT 26ML (MISCELLANEOUS) ×3 IMPLANT
CLOTH BEACON ORANGE TIMEOUT ST (SAFETY) ×2 IMPLANT
COVER TABLE BACK 60X90 (DRAPES) ×2 IMPLANT
CUFF TOURNIQUET SINGLE 34IN LL (TOURNIQUET CUFF) ×2 IMPLANT
DRAPE EXTREMITY T 121X128X90 (DRAPE) ×2 IMPLANT
DRAPE OEC MINIVIEW 54X84 (DRAPES) ×1 IMPLANT
DRAPE U-SHAPE 47X51 STRL (DRAPES) ×2 IMPLANT
DRSG EMULSION OIL 3X3 NADH (GAUZE/BANDAGES/DRESSINGS) ×3 IMPLANT
DRSG PAD ABDOMINAL 8X10 ST (GAUZE/BANDAGES/DRESSINGS) ×4 IMPLANT
DURA STEPPER LG (CAST SUPPLIES) IMPLANT
DURA STEPPER MED (CAST SUPPLIES) IMPLANT
ELECT REM PT RETURN 9FT ADLT (ELECTROSURGICAL) ×2
ELECTRODE REM PT RTRN 9FT ADLT (ELECTROSURGICAL) ×1 IMPLANT
GLOVE BIO SURGEON STRL SZ8 (GLOVE) ×2 IMPLANT
GLOVE BIOGEL M STRL SZ7.5 (GLOVE) ×1 IMPLANT
GLOVE BIOGEL PI IND STRL 8 (GLOVE) ×1 IMPLANT
GLOVE BIOGEL PI INDICATOR 8 (GLOVE) ×2
GLOVE EXAM NITRILE MD LF STRL (GLOVE) ×1 IMPLANT
GOWN PREVENTION PLUS XLARGE (GOWN DISPOSABLE) ×2 IMPLANT
GOWN PREVENTION PLUS XXLARGE (GOWN DISPOSABLE) ×3 IMPLANT
KIT BIO-TENODESIS 3X8 DISP (MISCELLANEOUS) ×2
KIT INSRT BABSR STRL DISP BTN (MISCELLANEOUS) IMPLANT
NDL SAFETY ECLIPSE 18X1.5 (NEEDLE) IMPLANT
NEEDLE HYPO 18GX1.5 SHARP (NEEDLE)
NEEDLE HYPO 22GX1.5 SAFETY (NEEDLE) IMPLANT
NS IRRIG 1000ML POUR BTL (IV SOLUTION) ×2 IMPLANT
PACK ACHILLES SUTUREBRIDGE (Anchor) ×1 IMPLANT
PACK BASIN DAY SURGERY FS (CUSTOM PROCEDURE TRAY) ×2 IMPLANT
PAD CAST 4YDX4 CTTN HI CHSV (CAST SUPPLIES) ×2 IMPLANT
PADDING CAST ABS 4INX4YD NS (CAST SUPPLIES)
PADDING CAST ABS COTTON 4X4 ST (CAST SUPPLIES) IMPLANT
PADDING CAST COTTON 4X4 STRL (CAST SUPPLIES) ×4
PADDING CAST COTTON 6X4 STRL (CAST SUPPLIES) ×2 IMPLANT
PENCIL BUTTON HOLSTER BLD 10FT (ELECTRODE) ×2 IMPLANT
SANITIZER HAND PURELL 535ML FO (MISCELLANEOUS) ×2 IMPLANT
SCREW BIO TENODEIS 7MM (Screw) ×1 IMPLANT
SHEET MEDIUM DRAPE 40X70 STRL (DRAPES) ×2 IMPLANT
SLEEVE SCD COMPRESS KNEE MED (MISCELLANEOUS) ×2 IMPLANT
SPLINT FAST PLASTER 5X30 (CAST SUPPLIES) ×20
SPLINT PLASTER CAST FAST 5X30 (CAST SUPPLIES) ×20 IMPLANT
SPONGE GAUZE 4X4 12PLY (GAUZE/BANDAGES/DRESSINGS) ×2 IMPLANT
SPONGE LAP 18X18 X RAY DECT (DISPOSABLE) ×2 IMPLANT
STAPLER VISISTAT 35W (STAPLE) IMPLANT
STOCKINETTE 6  STRL (DRAPES) ×1
STOCKINETTE 6 STRL (DRAPES) ×1 IMPLANT
STRIP CLOSURE SKIN 1/2X4 (GAUZE/BANDAGES/DRESSINGS) IMPLANT
SUCTION FRAZIER TIP 10 FR DISP (SUCTIONS) ×1 IMPLANT
SUT ETHIBOND 2 OS 4 DA (SUTURE) IMPLANT
SUT ETHIBOND 3-0 V-5 (SUTURE) IMPLANT
SUT ETHILON 3 0 PS 1 (SUTURE) ×3 IMPLANT
SUT FIBERWIRE #2 38 T-5 BLUE (SUTURE)
SUT MNCRL AB 3-0 PS2 18 (SUTURE) ×4 IMPLANT
SUT MNCRL AB 4-0 PS2 18 (SUTURE) IMPLANT
SUT VIC AB 0 CT1 27 (SUTURE) ×2
SUT VIC AB 0 CT1 27XBRD ANBCTR (SUTURE) IMPLANT
SUT VIC AB 2-0 SH 27 (SUTURE)
SUT VIC AB 2-0 SH 27XBRD (SUTURE) ×1 IMPLANT
SUT VICRYL 4-0 PS2 18IN ABS (SUTURE) IMPLANT
SUTURE FIBERWR #2 38 T-5 BLUE (SUTURE) IMPLANT
SYR BULB 3OZ (MISCELLANEOUS) ×2 IMPLANT
TOWEL OR 17X24 6PK STRL BLUE (TOWEL DISPOSABLE) ×2 IMPLANT
TOWEL OR NON WOVEN STRL DISP B (DISPOSABLE) ×2 IMPLANT
TUBE CONNECTING 20X1/4 (TUBING) ×1 IMPLANT
UNDERPAD 30X30 INCONTINENT (UNDERPADS AND DIAPERS) ×2 IMPLANT

## 2013-02-25 NOTE — Anesthesia Preprocedure Evaluation (Addendum)
Anesthesia Evaluation  Patient identified by MRN, date of birth, ID band Patient awake    Reviewed: Allergy & Precautions, NPO status   History of Anesthesia Complications (+) PONV  Airway Mallampati: I TM Distance: >3 FB Neck ROM: Full    Dental  (+) Teeth Intact and Dental Advisory Given   Pulmonary  breath sounds clear to auscultation        Cardiovascular hypertension, Pt. on medications Rhythm:Regular Rate:Normal     Neuro/Psych  Headaches, PSYCHIATRIC DISORDERS Anxiety Depression    GI/Hepatic GERD-  Medicated,  Endo/Other  diabetes, Type 2, Oral Hypoglycemic AgentsHypothyroidism   Renal/GU      Musculoskeletal   Abdominal   Peds  Hematology   Anesthesia Other Findings   Reproductive/Obstetrics                          Anesthesia Physical Anesthesia Plan  ASA: III  Anesthesia Plan: General   Post-op Pain Management:    Induction: Intravenous  Airway Management Planned: Oral ETT  Additional Equipment:   Intra-op Plan:   Post-operative Plan: Extubation in OR  Informed Consent: I have reviewed the patients History and Physical, chart, labs and discussed the procedure including the risks, benefits and alternatives for the proposed anesthesia with the patient or authorized representative who has indicated his/her understanding and acceptance.   Dental advisory given  Plan Discussed with: CRNA, Anesthesiologist and Surgeon  Anesthesia Plan Comments:         Anesthesia Quick Evaluation

## 2013-02-25 NOTE — Transfer of Care (Signed)
Immediate Anesthesia Transfer of Care Note  Patient: Tracy [name]  Procedure(s) Performed: Procedure(s): RIGHT ACHILLES DEBRIDEMENT AND RECONSTRUCTION;  HAGLUND'S EXCISION; GASTROC RECESSION AND FLEXOR HALLUCIS LONGUS TRANSFER (Right)  Patient Location: PACU  Anesthesia Type:GA combined with regional for post-op pain  Level of Consciousness: awake and patient cooperative  Airway & Oxygen Therapy: Patient Spontanous Breathing and Patient connected to face mask oxygen  Post-op Assessment: Report given to PACU RN and Post -op Vital signs reviewed and stable  Post vital signs: Reviewed and stable  Complications: No apparent anesthesia complications

## 2013-02-25 NOTE — Progress Notes (Signed)
Assisted Dr. Crews with right, ultrasound guided, popliteal/saphenous block. Side rails up, monitors on throughout procedure. See vital signs in flow sheet. Tolerated Procedure well. 

## 2013-02-25 NOTE — H&P (Signed)
Tracy [name] is an 47 y.o. female.   Chief Complaint: right heel pain HPI: 47 y/o female with right heel pain for years.  She has a haglund deformity, achilles tendonitis and tight heelcord.  She has failed nonoperative treatrment including activity modification, shoewear modification, oral anti-inflammatories and PT.  She presents now for operative treatment.  Past Medical History  Diagnosis Date  . PCOS (polycystic ovarian syndrome)   . History of migraine headaches   . Hypothyroidism   . GERD (gastroesophageal reflux disease)   . History of kidney stones   . Arthritis     back, knees, right elbow  . Anxiety   . High cholesterol     no current med.  . Hypertension     under control with meds., has been on med. x 4 yr.  . Depression   . Achilles tendonitis 02/2013    right  . Acquired Haglund's deformity of right heel 02/2013  . 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 PONV (postoperative nausea and vomiting)     also hx. of emergence delirium 2007  . Insulin resistance     does not check blood sugar at home  . History of electroconvulsive therapy   . Dental crowns present     Past Surgical History  Procedure Laterality Date  . Appendectomy    . Pilonidal cyst excision  1994  . Total thyroidectomy  2003  . Ovarian cyst surgery Left   . Lumbar laminectomy      X 3  . Salpingectomy Right 1984  . Achilles tendon surgery Left 2007    Family History  Problem Relation Age of Onset  . Breast cancer Mother   . Hypertension Mother   . Heart disease Father   . Cancer Brother     THROAT   Social History:  reports that she has been smoking Cigarettes.  She has been smoking about 0.00 packs per day for the past 11 years. She has never used smokeless tobacco. She reports that  drinks alcohol. She reports that she does not use illicit drugs.  Allergies:  Allergies  Allergen Reactions  . Sulfa Antibiotics Itching, Swelling and Rash  . Bupropion Hcl Itching and Rash  .  Codeine Itching    Medications Prior to Admission  Medication Sig Dispense Refill  . ARIPiprazole (ABILIFY) 5 MG tablet Take 5 mg by mouth daily.       . DULoxetine (CYMBALTA) 60 MG capsule Take 60 mg by mouth daily.      Marland Kitchen levothyroxine (SYNTHROID, LEVOTHROID) 150 MCG tablet Take 350 mcg by mouth daily.       Marland Kitchen lisinopril-hydrochlorothiazide (PRINZIDE,ZESTORETIC) 10-12.5 MG per tablet TAKE 1/2 TABLET BY MOUTH DAILY  45 tablet  3  . metFORMIN (GLUCOPHAGE-XR) 750 MG 24 hr tablet Take 1 tablet (750 mg total) by mouth daily with breakfast.  90 tablet  3  . ranitidine (ZANTAC) 150 MG tablet Take 150 mg by mouth daily.         Results for orders placed during the hospital encounter of 02/25/13 (from the past 48 hour(s))  BASIC METABOLIC PANEL     Status: Abnormal   Collection Time    02/24/13  9:00 AM      Result Value Range   Sodium 136  135 - 145 mEq/L   Potassium 3.6  3.5 - 5.1 mEq/L   Chloride 102  96 - 112 mEq/L   CO2 23  19 - 32 mEq/L   Glucose,  Bld 148 (*) 70 - 99 mg/dL   BUN 11  6 - 23 mg/dL   Creatinine, Ser 4.09  0.50 - 1.10 mg/dL   Calcium 9.6  8.4 - 81.1 mg/dL   GFR calc non Af Amer >90  >90 mL/min   GFR calc Af Amer >90  >90 mL/min   Comment:            The eGFR has been calculated     using the CKD EPI equation.     This calculation has not been     validated in all clinical     situations.     eGFR's persistently     <90 mL/min signify     possible Chronic Kidney Disease.  GLUCOSE, CAPILLARY     Status: Abnormal   Collection Time    2013/03/15  7:06 AM      Result Value Range   Glucose-Capillary 134 (*) 70 - 99 mg/dL   No results found.  ROS  No recent f/c/n/v/wt loss  Blood pressure 128/76, pulse 82, temperature 98.2 F (36.8 C), temperature source Oral, resp. rate 20, height 5\' 11"  (1.803 m), weight 153.089 kg (337 lb 8 oz), last menstrual period 2013-03-15, SpO2 97.00%. Physical Exam  wn wd woman in nad.  A and O x 4.  Mood and affect normal.  EOMI.  Resp  unlabored.  R heel TTP.  Heelcord is tight.  Skin healthy and intact.  Sens to LT intact at the right foot.  Palpable pulses at right foot.  Assessment/Plan Right achilles insertional tendonitis, gastroc contracture and haglund deformity - to OR for operative treatment.  The risks and benefits of the alternative treatment options have been discussed in detail.  The patient wishes to proceed with surgery and specifically understands risks of bleeding, infection, nerve damage, blood clots, need for additional surgery, amputation and death.   Toni Arthurs 03-15-13, 7:20 AM

## 2013-02-25 NOTE — Anesthesia Postprocedure Evaluation (Signed)
  Anesthesia Post-op Note  Patient: Tracy [name]  Procedure(s) Performed: Procedure(s): RIGHT ACHILLES DEBRIDEMENT AND RECONSTRUCTION;  HAGLUND'S EXCISION; GASTROC RECESSION AND FLEXOR HALLUCIS LONGUS TRANSFER (Right)  Patient Location: PACU  Anesthesia Type:GA combined with regional for post-op pain  Level of Consciousness: awake, alert  and oriented  Airway and Oxygen Therapy: Patient Spontanous Breathing  Post-op Pain: none  Post-op Assessment: Post-op Vital signs reviewed  Post-op Vital Signs: Reviewed  Complications: No apparent anesthesia complications

## 2013-02-25 NOTE — Brief Op Note (Signed)
02/25/2013  9:56 AM  PATIENT:  Tracy [name]  47 y.o. female  PRE-OPERATIVE DIAGNOSIS:  Right achilles tendonitis; haglund deformity; tight heel cord   POST-OPERATIVE DIAGNOSIS:  Right achilles tendonitis and chronic rupture; haglund deformity; tight heel cord   Procedure(s): 1.  Right achilles tendon reconstruction 2.  Right gastrocnemius recession 3.  Transfer of the FHL tendon to the calcaneus 4.  Excision of right Haglund deformity  SURGEON:  Toni Arthurs, MD  ASSISTANT: n/a  ANESTHESIA:   General, regional  EBL:  minimal   TOURNIQUET:   Total Tourniquet Time Documented: Thigh (Right) - 89 minutes Total: Thigh (Right) - 89 minutes   COMPLICATIONS:  None apparent  DISPOSITION:  Extubated, awake and stable to recovery.  DICTATION ID:  [id number]

## 2013-02-25 NOTE — Anesthesia Procedure Notes (Addendum)
Procedure Name: Intubation Date/Time: 02/25/2013 7:40 AM Performed by: BLOCKER, TIMOTHY D Pre-anesthesia Checklist: Patient identified, Emergency Drugs available, Suction available and Patient being monitored Patient Re-evaluated:Patient Re-evaluated prior to inductionOxygen Delivery Method: Circle System Utilized Preoxygenation: Pre-oxygenation with 100% oxygen Intubation Type: IV induction Ventilation: Mask ventilation without difficulty Grade View: Grade I Tube type: Oral Tube size: 7.0 mm Number of attempts: 1 Airway Equipment and Method: stylet,  oral airway and Video-laryngoscopy Placement Confirmation: ETT inserted through vocal cords under direct vision,  positive ETCO2 and breath sounds checked- equal and bilateral Secured at: 22 cm Tube secured with: Tape Dental Injury: Teeth and Oropharynx as per pre-operative assessment    Anesthesia Regional Block:  Popliteal block  Pre-Anesthetic Checklist: ,, timeout performed, Correct Patient, Correct Site, Correct Laterality, Correct Procedure, Correct Position, site marked, Risks and benefits discussed,  Surgical consent,  Pre-op evaluation,  At surgeon's request and post-op pain management  Laterality: Right and Lower  Prep: chloraprep       Needles:  Injection technique: Single-shot  Needle Type: Echogenic Needle     Needle Length: 9cm  Needle Gauge: 21    Additional Needles:  Procedures: ultrasound guided (picture in chart) Popliteal block Narrative:  Start time: 02/25/2013 7:15 AM End time: 02/25/2013 7:22 AM Injection made incrementally with aspirations every 5 mL.  Performed by: Personally  Anesthesiologist: Sheldon Silvan, MD  Additional Notes: A Right saphenous block was placed after completion of the popliteal block with 10 ml of the total 30 ml of Marcaine.  Popliteal block

## 2013-02-26 ENCOUNTER — Encounter (HOSPITAL_BASED_OUTPATIENT_CLINIC_OR_DEPARTMENT_OTHER): Payer: Self-pay | Admitting: Orthopedic Surgery

## 2013-02-26 NOTE — Op Note (Signed)
Tracy [name], Tracy [name]             ACCOUNT NO.:  [id number]  MEDICAL RECORD NO.:  [id number]  LOCATION:                                 FACILITY:  PHYSICIAN:  Toni Arthurs, MD        DATE OF BIRTH:  [birth year]  DATE OF PROCEDURE:  02/25/2013 DATE OF DISCHARGE:                              OPERATIVE REPORT   PREOPERATIVE DIAGNOSES: 1. Right Achilles insertional tendinopathy. 2. Right Haglund deformity. 3. Tight right heel cord.  POSTOPERATIVE DIAGNOSES: 1. 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 2. Right Haglund deformity. 3. Tight right heel cord.  PROCEDURE: 1. Right gastrocnemius recession. 2. Transfer of the FHL tendon to the calcaneus. 3. Excision of the right Haglund deformity. 4. Right Achilles tendon reconstruction.  SURGEON:  Toni Arthurs, MD  ANESTHESIA:  General.  ESTIMATED BLOOD LOSS:  Minimal.  TOURNIQUET TIME:  One hour and 29 minutes at 250 mmHg.  COMPLICATIONS:  None apparent.  DISPOSITION:  Extubated awake and stable to recovery.  INDICATIONS FOR PROCEDURE:  The patient is a 47 year old woman with a long history of right heel pain.  When I saw her in clinic several months ago, she had signs and symptoms consistent with insertional Achilles tendinopathy with some calcifications within the tendon.  She initially wanted to defer surgical treatment for several months.  She then developed a significant increase in her pain.  She came to surgery earlier than initially anticipated.  She presents now for operative treatment of this heel pain.  She has failed nonoperative treatment thus far with activity modification, shoe wear modification, oral anti- inflammatories, activity modification, and physical therapy.  She elects surgical treatment and specifically understands risks of bleeding, infection, nerve damage, blood clots, need for additional surgery, amputation, and death.  PROCEDURE IN DETAIL:  After preoperative  consent was obtained, the correct operative site was identified.  The patient was brought to the operating room supine on the gurney.  General anesthesia was induced. Preoperative antibiotics were administered.  Surgical time-out was taken.  The patient was then turned into the prone position on the operating table.  The right lower extremity was prepped and draped in standard sterile fashion.  The right lower extremity was exsanguinated and tourniquet was inflated to 250 mmHg.  A longitudinal incision was made at the posterior aspect of the calf.  Sharp dissection was carried down through the skin and subcutaneous tissue, taking care to protect the sural nerve and lesser saphenous vein.  Superficial fascia was incised.  The gastrocnemius tendon was identified.  The tendon was incised and released from medial to lateral in its entirety.  The soleus fascia was incised as well.  The wound was irrigated copiously. Inverted simple sutures of 3-0 Monocryl were used to close the subcutaneous tissue and a running 3-0 nylon was used to close the skin incision.  Attention was then turned to the posterior aspect of the heel.  A midline incision was made and sharp dissection was carried down full thickness through the skin, subcutaneous tissue, and paratenon. Immediately evident was a large calcification within the tendon itself that was completely avulsed from its insertion on the posterior  aspect of the calcaneus.  The tendon was freed up from the paratenon.  This rupture had allowed the tendon to retract approximately 1 cm proximal. A #15 blade was then used to shell out all of the calcifications within the tendon as well as all the diseased portions of the tendon.  This left a gap of several cm.  The patient's Haglund deformity was exposed in its entirety.  Given the retracted nature of the tendon, the decision was made to proceed with FHL transfer to augment the reconstruction of the Achilles.   The superficial fascia over the FHL was incised.  The FHL tendon was identified.  The tibial nerve and posterior tibial artery were protected while the FHL was released approximately 2 cm distal from the end of its muscle belly.  The tendon was measured.  A fiber loop whipstitch was placed within the end of the tendon.  A 7 mm x 23 mm Arthrex Bio-Tenodesis Screw was selected.  Prior to proceeding with the transfer, the oscillating saw was used to resect the Haglund deformity in its entirety.  This left a footprint of healthy medullary bone for repair of the tendon.  At the junction of the cut and the superior aspect of the calcaneus, a Beath pin was driven down through the calcaneus and out the sole of the foot.  The Beath pin was then over- drilled with a 7-1/2 mm drill bit.  The whip stitch from the FHL tendon was pulled down into this socket in the calcaneus.  The FHL was appropriately tensioned and a 7 mm x 23 mm Bio-Tenodesis Screw was inserted securing the FHL in place.  At this point, the Arthrex suture bridge construct was used to repair the Achilles tendon, down to the cut surface of the calcaneus.  Getting the tendon down to bone required plantar flexion of the ankle approximately 15 degrees or so.  The ankle could be dorsiflexed neutral without significant tendon gapping.  The wound was then irrigated copiously.  Inverted simple sutures of 3-0 Monocryl were used to close the paratenon over the tendon.  Subcutaneous tissue was approximated with inverted simple sutures of 3-0 Monocryl and a running 3-0 nylon was used to close the skin incision.  The tourniquet was released at an hour and 29 minutes.  Sterile dressings were applied followed by well-padded short-leg splint with the ankle held in resting plantar flexion.  The patient was then awakened from anesthesia and transported to recovery room in stable condition.  FOLLOWUP PLAN:  The patient will be nonweightbearing in the  splint for the next 2 weeks.  She will follow up with me in the office for suture removal and conversion to a cast.  We will attempt in clinic to dorsiflex her maximally when putting the cast on.     Toni Arthurs, MD     JH/MEDQ  D:  02/25/2013  T:  02/26/2013  Job:  [id number]

## 2013-05-20 ENCOUNTER — Other Ambulatory Visit: Payer: Self-pay

## 2013-05-20 DIAGNOSIS — Z1231 Encounter for screening mammogram for malignant neoplasm of breast: Secondary | ICD-10-CM

## 2013-05-20 DIAGNOSIS — Z803 Family history of malignant neoplasm of breast: Secondary | ICD-10-CM

## 2013-06-01 ENCOUNTER — Encounter (HOSPITAL_COMMUNITY): Payer: Self-pay | Admitting: Emergency Medicine

## 2013-06-01 ENCOUNTER — Emergency Department (HOSPITAL_COMMUNITY)
Admission: EM | Admit: 2013-06-01 | Discharge: 2013-06-01 | Disposition: A | Discharge status: Home / Self Care | Payer: 59 | Attending: Emergency Medicine | Admitting: Emergency Medicine

## 2013-06-01 DIAGNOSIS — Z98811 Dental restoration status: Secondary | ICD-10-CM | POA: Insufficient documentation

## 2013-06-01 DIAGNOSIS — E119 Type 2 diabetes mellitus without complications: Secondary | ICD-10-CM | POA: Insufficient documentation

## 2013-06-01 DIAGNOSIS — F17200 Nicotine dependence, unspecified, uncomplicated: Secondary | ICD-10-CM | POA: Insufficient documentation

## 2013-06-01 DIAGNOSIS — K219 Gastro-esophageal reflux disease without esophagitis: Secondary | ICD-10-CM | POA: Insufficient documentation

## 2013-06-01 DIAGNOSIS — F329 Major depressive disorder, single episode, unspecified: Secondary | ICD-10-CM | POA: Insufficient documentation

## 2013-06-01 DIAGNOSIS — M129 Arthropathy, unspecified: Secondary | ICD-10-CM | POA: Insufficient documentation

## 2013-06-01 DIAGNOSIS — R739 Hyperglycemia, unspecified: Secondary | ICD-10-CM

## 2013-06-01 DIAGNOSIS — E039 Hypothyroidism, unspecified: Secondary | ICD-10-CM | POA: Insufficient documentation

## 2013-06-01 DIAGNOSIS — E282 Polycystic ovarian syndrome: Secondary | ICD-10-CM | POA: Insufficient documentation

## 2013-06-01 DIAGNOSIS — Z87442 Personal history of urinary calculi: Secondary | ICD-10-CM | POA: Insufficient documentation

## 2013-06-01 DIAGNOSIS — E669 Obesity, unspecified: Secondary | ICD-10-CM | POA: Insufficient documentation

## 2013-06-01 DIAGNOSIS — F3289 Other specified depressive episodes: Secondary | ICD-10-CM | POA: Insufficient documentation

## 2013-06-01 DIAGNOSIS — F411 Generalized anxiety disorder: Secondary | ICD-10-CM | POA: Insufficient documentation

## 2013-06-01 DIAGNOSIS — Z9889 Other specified postprocedural states: Secondary | ICD-10-CM | POA: Insufficient documentation

## 2013-06-01 DIAGNOSIS — I10 Essential (primary) hypertension: Secondary | ICD-10-CM | POA: Insufficient documentation

## 2013-06-01 DIAGNOSIS — Z79899 Other long term (current) drug therapy: Secondary | ICD-10-CM | POA: Insufficient documentation

## 2013-06-01 LAB — URINALYSIS, ROUTINE W REFLEX MICROSCOPIC
Glucose, UA: >1000 mg/dL — AB
Ketones, ur: 40 mg/dL — AB
Leukocytes, UA: NEGATIVE
Nitrite: NEGATIVE
Protein, ur: NEGATIVE mg/dL
pH: 5 (ref 5.0–8.0)

## 2013-06-01 LAB — URINE MICROSCOPIC-ADD ON

## 2013-06-01 LAB — CBC WITH DIFFERENTIAL/PLATELET
Basophils Absolute: 0.1 10*3/uL (ref 0.0–0.1)
Basophils Relative: 1 % (ref 0–1)
Eosinophils Absolute: 0.1 10*3/uL (ref 0.0–0.7)
Eosinophils Relative: 2 % (ref 0–5)
Lymphs Abs: 1.8 10*3/uL (ref 0.7–4.0)
MCH: 31.2 pg (ref 26.0–34.0)
MCHC: 35.1 g/dL (ref 30.0–36.0)
MCV: 89 fL (ref 78.0–100.0)
Monocytes Absolute: 0.4 10*3/uL (ref 0.1–1.0)
Platelets: 201 10*3/uL (ref 150–400)
RBC: 5.16 MIL/uL — ABNORMAL HIGH (ref 3.87–5.11)
RDW: 14.9 % (ref 11.5–15.5)

## 2013-06-01 LAB — COMPREHENSIVE METABOLIC PANEL
ALT: 75 U/L — ABNORMAL HIGH (ref 0–35)
AST: 83 U/L — ABNORMAL HIGH (ref 0–37)
Albumin: 4.2 g/dL (ref 3.5–5.2)
Alkaline Phosphatase: 188 U/L — ABNORMAL HIGH (ref 39–117)
Calcium: 10.5 mg/dL (ref 8.4–10.5)
Creatinine, Ser: 0.79 mg/dL (ref 0.50–1.10)
GFR calc Af Amer: >90 mL/min (ref >90)
GFR calc non Af Amer: >90 mL/min (ref >90)
Glucose, Bld: 577 mg/dL (ref 70–99)
Potassium: 4.2 mEq/L (ref 3.5–5.1)
Sodium: 127 mEq/L — ABNORMAL LOW (ref 135–145)
Total Protein: 8.4 g/dL — ABNORMAL HIGH (ref 6.0–8.3)

## 2013-06-01 LAB — GLUCOSE, CAPILLARY
Glucose-Capillary: 487 mg/dL — ABNORMAL HIGH (ref 70–99)
Glucose-Capillary: 373 mg/dL — ABNORMAL HIGH (ref 70–99)

## 2013-06-01 MED ORDER — SODIUM CHLORIDE 0.9 % IV BOLUS (SEPSIS)
2000.0000 mL | Freq: Once | INTRAVENOUS | Status: AC
  Administered 2013-06-01: 2000 mL via INTRAVENOUS

## 2013-06-01 MED ORDER — GLIPIZIDE 10 MG PO TABS: 5.0000 mg | ORAL_TABLET | Freq: Two times a day (BID) | ORAL | Status: DC

## 2013-06-01 MED ORDER — INSULIN ASPART 100 UNIT/ML ~~LOC~~ SOLN
10.0000 [IU] | Freq: Once | SUBCUTANEOUS | Status: AC
  Administered 2013-06-01: 10 [IU] via INTRAVENOUS
  Filled 2013-06-01: qty 1

## 2013-06-01 MED ORDER — GLUCOSE BLOOD VI STRP: ORAL_STRIP | Status: DC

## 2013-06-01 MED ORDER — FREESTYLE SYSTEM KIT: 1.0000 | PACK | Status: DC | PRN

## 2013-06-01 NOTE — ED Notes (Signed)
Pt. reports excessive thirst , blurred vision , urinary frequency and poor appetite for several weeks , hyperglycemic at triage . No pain , denies nausea or vomitting .

## 2013-06-01 NOTE — ED Provider Notes (Signed)
CSN: [id number]     Arrival date & time 06/01/13  1915 History   First MD Initiated Contact with Patient 06/01/13 2113     Chief Complaint  Patient presents with  . Hyperglycemia    The history is provided by the patient. No language interpreter was used.    Patient is a 47 y.o. Caucasian female with past medical history of obesity who comes to the emergency department today with hyperglycemia. Over the past 2-3 weeks she has had increasing thirst, polyuria, polyphagia. 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 Is concerned she may be developing diabetes. Result she took her blood sugar at home. Her blood sugar was elevated to greater than meter would read. So she came to the emergency department. She denies abdominal pain, nausea, or vomiting.  She has had generalized body aches. She does not have a diagnosis of diabetes. However she was started on metformin by her primary care physician a few weeks ago.   Past Medical History  Diagnosis Date  . PCOS (polycystic ovarian syndrome)   . History of migraine headaches   . Hypothyroidism   . GERD (gastroesophageal reflux disease)   . History of kidney stones   . Arthritis     back, knees, right elbow  . Anxiety   . High cholesterol     no current med.  . Hypertension     under control with meds., has been on med. x 4 yr.  . Depression   . Achilles tendonitis 02/2013    right  . Acquired Haglund's deformity of right heel 02/2013  . 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 PONV (postoperative nausea and vomiting)     also hx. of emergence delirium 2007  . Insulin resistance     does not check blood sugar at home  . History of electroconvulsive therapy   . Dental crowns present    Past Surgical History  Procedure Laterality Date  . Appendectomy    . Pilonidal cyst excision  1994  . Total thyroidectomy  2003  . Ovarian cyst surgery Left   . Lumbar laminectomy      X 3  . Salpingectomy Right 1984  . Achilles tendon surgery  Left 2007  . Excision haglund's deformity with achilles tendon repair Right 02/25/2013    Procedure: RIGHT ACHILLES DEBRIDEMENT AND RECONSTRUCTION;  HAGLUND'S EXCISION; GASTROC RECESSION AND FLEXOR HALLUCIS LONGUS TRANSFER;  Surgeon: Toni Arthurs, MD;  Location: [facility];  Service: Orthopedics;  Laterality: Right;   Family History  Problem Relation Age of Onset  . Breast cancer Mother   . Hypertension Mother   . Heart disease Father   . Cancer Brother     THROAT   History  Substance Use Topics  . Smoking status: Current Every Day Smoker -- 11 years    Types: Cigarettes  . Smokeless tobacco: Never Used     Comment: 2-3 cig./day  . Alcohol Use: Yes     Comment: occasionally   OB History   Grav Para Term Preterm Abortions TAB SAB Ect Mult Living   0              Review of Systems  Constitutional: Negative for fever and chills.  Respiratory: Negative for cough and shortness of breath.   Cardiovascular: Negative for chest pain.  Gastrointestinal: Negative for nausea, vomiting, abdominal pain, diarrhea and constipation.  Endocrine: Positive for polydipsia, polyphagia and polyuria.  Genitourinary: Negative for dysuria, urgency and frequency.  Musculoskeletal: Negative for back pain.  All other systems reviewed and are negative.    Allergies  Sulfa antibiotics; Bupropion hcl; and Codeine  Home Medications   Current Outpatient Rx  Name  Route  Sig  Dispense  Refill  . ARIPiprazole (ABILIFY) 5 MG tablet   Oral   Take 5 mg by mouth daily.          . DULoxetine (CYMBALTA) 60 MG capsule   Oral   Take 60 mg by mouth daily.         Marland Kitchen L-Methylfolate 15 MG TABS   Oral   Take 15 mg by mouth daily.         Marland Kitchen levothyroxine (SYNTHROID, LEVOTHROID) 300 MCG tablet   Oral   Take 300 mcg by mouth daily before breakfast. *takes with tablet for dose         . levothyroxine (SYNTHROID, LEVOTHROID) 50 MCG tablet   Oral   Take 50 mcg by mouth daily  before breakfast. *takes with tablet for dose         . lisinopril-hydrochlorothiazide (PRINZIDE,ZESTORETIC) 10-12.5 MG per tablet   Oral   Take 1 tablet by mouth daily.         . metFORMIN (GLUCOPHAGE-XR) 750 MG 24 hr tablet   Oral   Take 750 mg by mouth daily with breakfast.         . naproxen sodium (ANAPROX) 220 MG tablet   Oral   Take 440 mg by mouth 2 (two) times daily as needed (for pain).         . ranitidine (ZANTAC) 150 MG tablet   Oral   Take 150 mg by mouth 3 (three) times daily as needed for heartburn.          Marland Kitchen glipiZIDE (GLUCOTROL) 10 MG tablet   Oral   Take 0.5 tablets (5 mg total) by mouth 2 (two) times daily before a meal.   30 tablet   0   . glucose blood test strip      Use as instructed   100 each   0   . glucose monitoring kit (FREESTYLE) monitoring kit   Does not apply   1 each by Does not apply route as needed for other.   1 each   0    BP 142/84  Pulse 84  Temp(Src) 98.2 F (36.8 C) (Oral)  Resp 18  SpO2 98% Physical Exam  Nursing note and vitals reviewed. Constitutional: She is oriented to person, place, and time. She appears well-developed and well-nourished. No distress.  HENT:  Head: Normocephalic and atraumatic.  Eyes: Pupils are equal, round, and reactive to light.  Neck: Normal range of motion.  Cardiovascular: Normal rate, regular rhythm, normal heart sounds and intact distal pulses.   Pulmonary/Chest: Effort normal. No respiratory distress. She has no wheezes. She exhibits no tenderness.  Abdominal: Soft. Bowel sounds are normal. She exhibits no distension. There is no tenderness. There is no rebound and no guarding.  Neurological: She is alert and oriented to person, place, and time. She has normal strength. No cranial nerve deficit or sensory deficit. She exhibits normal muscle tone. Coordination and gait normal.  Skin: Skin is warm and dry.    ED Course  Procedures  Labs Review Labs Reviewed   GLUCOSE, CAPILLARY - Abnormal; Notable for the following:    Glucose-Capillary 487 (*)    All other components within normal limits  URINALYSIS, ROUTINE W REFLEX MICROSCOPIC -  Abnormal; Notable for the following:    Specific Gravity, Urine 1.044 (*)    Glucose, UA >1000 (*)    Ketones, ur 40 (*)    All other components within normal limits  CBC WITH DIFFERENTIAL - Abnormal; Notable for the following:    RBC 5.16 (*)    Hemoglobin 16.1 (*)    All other components within normal limits  COMPREHENSIVE METABOLIC PANEL - Abnormal; Notable for the following:    Sodium 127 (*)    Chloride 89 (*)    Glucose, Bld 577 (*)    Total Protein 8.4 (*)    AST 83 (*)    ALT 75 (*)    Alkaline Phosphatase 188 (*)    All other components within normal limits  GLUCOSE, CAPILLARY - Abnormal; Notable for the following:    Glucose-Capillary 373 (*)    All other components within normal limits  URINE MICROSCOPIC-ADD ON   Imaging Review No results found.  EKG Interpretation   None       MDM  Ms. Weston Anna is a 47 year old Caucasian female with past medical history of obesity who comes emergency department today with hyperglycemia. Physical exam as above. With no diagnosis of diabetes Ms. Weston Anna was felt to require further evaluation. Initial workup included a UA, CBC, CMP, CBG glucose. Initial glucose was 47. UA demonstrated large glucose small ketones. CBC was unremarkable. CMP had a glucose of 577, sodium of 127, chloride of 89, anion gap of 18. With overall well appearance, no abdominal pain, no nausea, no vomiting, doubt DKA. Patient was treated with 2 L of normal saline and 10 units of insulin IV. Repeat glucose was 373. 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 She has already on metformin. She is felt to require treatment with glipizide. She is provided with a prescription for  glipizide 5 mg twice a day, a glucometer, and glucometer strips. She was instructed to check her blood sugar twice daily and to followup with her primary care physician on Friday with a log of her blood sugars. She was instructed to return to the emergency department if she develops severe hyperglycemia, abdominal pain, nausea, vomiting, or any other concerns. Patient expressed understanding. She was discharged in stable condition. Labs were reviewed by myself and considered and medical decision-making. Care was discussed with my attending Dr. Denton Lank.    1. Hyperglycemia without ketosis   2. Diabetes      Bethann Berkshire, MD 06/01/13 [phone]

## 2013-06-03 NOTE — ED Provider Notes (Signed)
I saw and evaluated the patient, reviewed the resident's note and I agree with the findings and plan.  EKG Interpretation   None       Pt c/o polyuria, high blood sugar. Had been on metformin in past. Alert, content. abd soft nt. No nv. Labs. Ivf.   Suzi Roots, MD 06/03/13 [phone]

## 2013-06-05 ENCOUNTER — Encounter: Payer: 59 | Attending: Family Medicine

## 2013-06-05 VITALS — Ht 71.0 in | Wt 317.5 lb

## 2013-06-05 DIAGNOSIS — Z713 Dietary counseling and surveillance: Secondary | ICD-10-CM | POA: Insufficient documentation

## 2013-06-05 DIAGNOSIS — E119 Type 2 diabetes mellitus without complications: Secondary | ICD-10-CM | POA: Insufficient documentation

## 2013-06-05 NOTE — Progress Notes (Signed)
Patient was seen on 06/05/13 for the complete diabetes self-management series at the [facility].  Current A1c = 13.4% on 06/02/13  Handouts given during class include:  Living Well with Diabetes book  Carb Counting and Meal Planning book  Meal Plan Card  Carbohydrate guide  Meal planning worksheet  Low Sodium Flavoring Tips  The diabetes portion plate  Low Carbohydrate Snack Suggestions  A1c to eAG Conversion Chart  Diabetes Medications  Stress Management  Diabetes Recommended Care Schedule  Diabetes Success Plan  Core Class Satisfaction Survey  The following learning objectives were met by the patient during this course:  Describe diabetes  State some common risk factors for diabetes  Defines the role of glucose and insulin  Identifies type of diabetes and pathophysiology  Describe the relationship between diabetes and cardiovascular risk  State the members of the Healthcare Team  States the rationale for glucose monitoring  State when to test glucose  State their individual Target Range  State the importance of logging glucose readings  Describe how to interpret glucose readings  Identifies A1C target  Explain the correlation between A1c and eAG values  State symptoms and treatment of high blood glucose  State symptoms and treatment of low blood glucose  Explain proper technique for glucose testing  Identifies proper sharps disposal  Describe the role of different macronutrients on glucose  Explain how carbohydrates affect blood glucose  State what foods contain the most carbohydrates  Demonstrate carbohydrate counting  Demonstrate how to read Nutrition Facts food label  Describe effects of various fats on heart health  Describe the importance of good nutrition for health and healthy eating strategies  Describe techniques for managing your shopping, cooking and meal planning  List strategies to follow  meal plan when dining out  Describe the effects of alcohol on glucose and how to use it safely   State the amount of activity recommended for healthy living   Describe activities suitable for individual needs   Identify ways to regularly incorporate activity into daily life   Identify barriers to activity and ways to over come these barriers  Identify diabetes medications being personally used and their primary action for lowering glucose and possible side effects   Describe role of stress on blood glucose and develop strategies to address psychosocial issues   Identify diabetes complications and ways to prevent them  Explain how to manage diabetes during illness   Evaluate success in meeting personal goal   Establish 2-3 goals that they will plan to diligently work on until they return for the  63-month follow-up visit  Your patient has established the following 4 month goals in their individualized success plan:  Count carbohydrates at most meals and snacks  Increase activity at least 3 days a week  Take diabetes medications as scheduled  Your patient has identified these potential barriers to change:  Unsupportive family   comorbidities  Your patient has identified their diabetes self-care support plan as  [facility]

## 2013-06-10 ENCOUNTER — Encounter: Payer: Self-pay | Admitting: Internal Medicine

## 2013-06-10 ENCOUNTER — Other Ambulatory Visit: Payer: Self-pay

## 2013-06-10 ENCOUNTER — Ambulatory Visit (INDEPENDENT_AMBULATORY_CARE_PROVIDER_SITE_OTHER): Payer: 59 | Admitting: Internal Medicine

## 2013-06-10 VITALS — BP 122/82 | HR 96 | Temp 98.5°F | Resp 12 | Ht 71.0 in | Wt 320.7 lb

## 2013-06-10 DIAGNOSIS — E119 Type 2 diabetes mellitus without complications: Secondary | ICD-10-CM | POA: Insufficient documentation

## 2013-06-10 HISTORY — DX: Type 2 diabetes mellitus without complications: E11.9

## 2013-06-10 MED ORDER — METFORMIN HCL ER (MOD) 1000 MG PO TB24: 1000.0000 mg | ORAL_TABLET | Freq: Two times a day (BID) | ORAL | Status: DC

## 2013-06-10 MED ORDER — INSULIN GLARGINE 100 UNIT/ML ~~LOC~~ SOLN: SUBCUTANEOUS | Status: DC

## 2013-06-10 MED ORDER — INSULIN PEN NEEDLE 32G X 4 MM MISC: Status: DC

## 2013-06-10 MED ORDER — SITAGLIPTIN PHOSPHATE 100 MG PO TABS: 100.0000 mg | ORAL_TABLET | Freq: Every day | ORAL | Status: DC

## 2013-06-10 NOTE — Progress Notes (Signed)
Patient ID: Tracy [name], female   DOB: January 10, 1966, 47 y.o.   MRN: [id number]  HPI: Tracy [name] is a 47 y.o.-year-old female, self- referred for management of recently dx-ed DM2, uncontrolled, without complications.  Patient has been diagnosed with diabetes in 06/01/2013; at that time, she had a sugar: HI. She has been feeling poorly with fatigue and thirst x 1 mo before.   Last hemoglobin A1c was (06/04/2013) 11.4%. Lab Results  Component Value Date   HGBA1C 5.7 07/04/2011   HGBA1C 5.4 02/08/2010   HGBA1C 5.1 02/16/2009   Pt is on a regimen of: - Metformin XR 750 bid. She did not try the immediate release - Glipizide 5 mg bid >> increased to 10 mg bid 1 week ago  Pt checks her sugars 3-4x a day, sometimes more than that a day and they are: 200-300, 7 day ave: 289  No lows. Lowest sugar was 175; ? Level of hypoglycemia awareness Highest sugar was HI.  Pt's meals are: - Breakfast: scrambled eggs and cheese, piece of toast, grapes, coffee, V8, water - Lunch: salad with chicken or [place] sandwich, cheese cubes, water - Dinner: [place] meatloaf, or small piece of pizza, salad and water - Snacks: 1-2 a day She went to the diabetes class last week. She also sees [place] in [place] to [employer].  - no CKD, last BUN/creatinine:  Lab Results  Component Value Date   BUN 14 06/01/2013   CREATININE 0.79 06/01/2013  - last eye exam was in Spring 2014. No DR.  - no numbness and tingling in her feet.  I reviewed her chart and she also has a history of PCOS for which she took Metformin. Menstrual cycles irregular: approx. 4x a year.   Pt has FH of DM in older brother.  ROS: Constitutional: + weight loss, + fatigue, + subjective hyperthermia, + hot flashes Eyes:+ blurry vision, no xerophthalmia ENT: no sore throat, no nodules palpated in throat, no dysphagia/odynophagia, no hoarseness Cardiovascular: no CP/SOB/palpitations/leg swelling Respiratory: no  cough/SOB Gastrointestinal: +N/no V/+D/no C/ + heartburn/ hemorrhoids Musculoskeletal: + muscle cramping/no joint aches Skin: no rashes Neurological: no tremors/numbness/tingling/dizziness Psychiatric: + both depression/anxiety + irregular menstrual cycles, low libido  Past Medical History  Diagnosis Date  . PCOS (polycystic ovarian syndrome)   . History of migraine headaches   . Hypothyroidism   . GERD (gastroesophageal reflux disease)   . History of kidney stones   . Arthritis     back, knees, right elbow  . Anxiety   . High cholesterol     no current med.  . Hypertension     under control with meds., has been on med. x 4 yr.  . Depression   . Achilles tendonitis 02/2013    right  . Acquired Haglund's deformity of right heel 02/2013  . 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 PONV (postoperative nausea and vomiting)     also hx. of emergence delirium 2007  . Insulin resistance     does not check blood sugar at home  . History of electroconvulsive therapy   . Dental crowns present   . Diabetes mellitus without complication   Patient also mentioned papillary thyroid cancer, however, per review of the thyroid pathology after thyroidectomy, this was benign  Past Surgical History  Procedure Laterality Date  . Appendectomy    . Pilonidal cyst excision  1994  . Total thyroidectomy  2003  . Ovarian cyst surgery Left   . Lumbar laminectomy  X 3  . Salpingectomy Right 1984  . Achilles tendon surgery Left 2007  . Excision haglund's deformity with achilles tendon repair Right 02/25/2013    Procedure: RIGHT ACHILLES DEBRIDEMENT AND RECONSTRUCTION;  HAGLUND'S EXCISION; GASTROC RECESSION AND FLEXOR HALLUCIS LONGUS TRANSFER;  Surgeon: Toni Arthurs, MD;  Location: [facility];  Service: Orthopedics;  Laterality: Right;   History   Social History  . Marital Status: Married    Spouse Name: N/A    Number of Children: 0   Occupational History  . Nurse    Social  History Main Topics  . Smoking status: Former Smoker -- 11 years    Types: Cigarettes  . Smokeless tobacco: Never Used     Comment: 2-3 cig./day  . Alcohol Use: No  . Drug Use: No  . Sexual Activity: Not Currently    Partners: Male   Social History Narrative   RN - [employer]   Regular exercise: no   Caffeine use: 2 x daily   Current Outpatient Prescriptions on File Prior to Visit  Medication Sig Dispense Refill  . ARIPiprazole (ABILIFY) 5 MG tablet Take 5 mg by mouth daily.       . DULoxetine (CYMBALTA) 60 MG capsule Take 60 mg by mouth daily.      Marland Kitchen glucose blood test strip Use as instructed  100 each  0  . glucose monitoring kit (FREESTYLE) monitoring kit 1 each by Does not apply route as needed for other.  1 each  0  . L-Methylfolate 15 MG TABS Take 15 mg by mouth daily.      Marland Kitchen levothyroxine (SYNTHROID, LEVOTHROID) 300 MCG tablet Take 300 mcg by mouth daily before breakfast. *takes with tablet for dose      . levothyroxine (SYNTHROID, LEVOTHROID) 50 MCG tablet Take 50 mcg by mouth daily before breakfast. *takes with tablet for dose      . lisinopril-hydrochlorothiazide (PRINZIDE,ZESTORETIC) 10-12.5 MG per tablet Take 1 tablet by mouth daily.      . naproxen sodium (ANAPROX) 220 MG tablet Take 440 mg by mouth 2 (two) times daily as needed (for pain).      . ranitidine (ZANTAC) 150 MG tablet Take 150 mg by mouth 3 (three) times daily as needed for heartburn.        No current facility-administered medications on file prior to visit.   Allergies  Allergen Reactions  . Sulfa Antibiotics Itching, Swelling and Rash  . Bupropion Hcl Itching and Rash  . Codeine Itching   Family History  Problem Relation Age of Onset  . Breast cancer Mother   . Hypertension Mother   . Heart disease Father   . Cancer Brother     THROAT   PE: BP 122/82  Pulse 96  Temp(Src) 98.5 F (36.9 C) (Oral)  Resp 12  Ht 5\' 11"  (1.803 m)  Wt 320 lb 11.2 oz (145.469 kg)   BMI 44.75 kg/m2  SpO2 97% Wt Readings from Last 3 Encounters:  06/10/13 320 lb 11.2 oz (145.469 kg)  06/05/13 317 lb 8 oz (144.017 kg)  02/25/13 337 lb 8 oz (153.089 kg)   Constitutional: obese, in NAD Eyes: PERRLA, EOMI, no exophthalmos ENT: moist mucous membranes, no thyromegaly, no cervical lymphadenopathy Cardiovascular: RRR, No MRG Respiratory: CTA B Gastrointestinal: abdomen soft, NT, ND, BS+ Musculoskeletal: no deformities, strength intact in all 4 Skin: moist, warm, no rashes Neurological: no tremor with outstretched hands, DTR normal in all 4  ASSESSMENT: 1.  DM2, newly dx'eduncontrolled, without complications  PLAN:  1. Patient with recently dx'ed DM2, on oral antidiabetic regimen, which is insufficient. She is having both nutrition and diabetes education currently. - We discussed about options for treatment, and I suggested to:  Patient Instructions  Please return in 1 month with your sugar log.  Add Lantus 18 units at bedtime. Add Januvia 100 mg daily in am. Increase Metformin XR to 1000 mg 2x a day Stop Glipizide for now. - discussed proper injection techniques:  Preference for the abdominal sq tissue  Rotation of sites  Change needle for each injection  Keep needle in for 10 sec after last unit of insulin in - Strongly advised her to start checking sugars at different times of the day - check 3 times a day, rotating checks - given sugar log and advised how to fill it and to bring it at next appt  - given foot care handout and explained the principles  - given instructions for hypoglycemia management "15-15 rule"  - advised for yearly eye exams - had flu vaccine this season - sent Rxs for Metformin, Januvia and Lantus + pen needles to her pharmacy - Return to clinic in 1 mo with sugar log

## 2013-06-10 NOTE — Patient Instructions (Signed)
Please return in 1 month with your sugar log.  Add Lantus 18 units at bedtime. Add Januvia 100 mg daily in am. Increase Metformin to 1000 mg 2x a day.  PATIENT INSTRUCTIONS FOR TYPE 2 DIABETES:  DIET AND EXERCISE Diet and exercise is an important part of diabetic treatment.  We recommended aerobic exercise in the form of brisk walking (working between 40-60% of maximal aerobic capacity, similar to brisk walking) for 150 minutes per week (such as 30 minutes five days per week) along with 3 times per week performing 'resistance' training (using various gauge rubber tubes with handles) 5-10 exercises involving the major muscle groups (upper body, lower body and core) performing 10-15 repetitions (or near fatigue) each exercise. Start at half the above goal but build slowly to reach the above goals. If limited by weight, joint pain, or disability, we recommend daily walking in a swimming pool with water up to waist to reduce pressure from joints while allow for adequate exercise.    BLOOD GLUCOSES Monitoring your blood glucoses is important for continued management of your diabetes. Please check your blood glucoses 2-4 times a day: fasting, before meals and at bedtime (you can rotate these measurements - e.g. one day check before the 3 meals, the next day check before 2 of the meals and before bedtime, etc.   HYPOGLYCEMIA (low blood sugar) Hypoglycemia is usually a reaction to not eating, exercising, or taking too much insulin/ other diabetes drugs.  Symptoms include tremors, sweating, hunger, confusion, headache, etc. Treat IMMEDIATELY with 15 grams of Carbs:   4 glucose tablets    cup regular juice/soda   2 tablespoons raisins   4 teaspoons sugar   1 tablespoon honey Recheck blood glucose in 15 mins and repeat above if still symptomatic/blood glucose <100. Please contact our office at [phone] if you have questions about how to next handle your insulin.  RECOMMENDATIONS TO REDUCE YOUR  RISK OF DIABETIC COMPLICATIONS: * Take your prescribed MEDICATION(S). * Follow a DIABETIC diet: Complex carbs, fiber rich foods, heart healthy fish twice weekly, (monounsaturated and polyunsaturated) fats * AVOID saturated/trans fats, high fat foods, >2,300 mg salt per day. * EXERCISE at least 5 times a week for 30 minutes or preferably daily.  * DO NOT SMOKE OR DRINK more than 1 drink a day. * Check your FEET every day. Do not wear tightfitting shoes. Contact us if you develop an ulcer * See your EYE doctor once a year or more if needed * Get a FLU shot once a year * Get a PNEUMONIA vaccine once before and once after age 17 years  GOALS:  * Your Hemoglobin A1c of <7%  * fasting sugars need to be <130 * after meals sugars need to be <180 (2h after you start eating) * Your Systolic BP should be 140 or lower  * Your Diastolic BP should be 80 or lower  * Your HDL (Good Cholesterol) should be 40 or higher  * Your LDL (Bad Cholesterol) should be 100 or lower  * Your Triglycerides should be 150 or lower  * Your Urine microalbumin (kidney function) should be <30 * Your Body Mass Index should be 25 or lower   We will be glad to help you achieve these goals. Our telephone number is: [phone].

## 2013-06-13 ENCOUNTER — Encounter: Payer: Self-pay | Admitting: Internal Medicine

## 2013-06-14 ENCOUNTER — Telehealth: Payer: Self-pay | Admitting: Internal Medicine

## 2013-06-14 ENCOUNTER — Other Ambulatory Visit: Payer: Self-pay | Admitting: Internal Medicine

## 2013-06-14 ENCOUNTER — Telehealth: Payer: Self-pay | Admitting: *Deleted

## 2013-06-14 DIAGNOSIS — E890 Postprocedural hypothyroidism: Secondary | ICD-10-CM

## 2013-06-14 DIAGNOSIS — E237 Disorder of pituitary gland, unspecified: Secondary | ICD-10-CM

## 2013-06-14 MED ORDER — LEVOTHYROXINE SODIUM 50 MCG PO TABS: 50.0000 ug | ORAL_TABLET | Freq: Every day | ORAL | Status: DC

## 2013-06-14 MED ORDER — LEVOTHYROXINE SODIUM 300 MCG PO TABS: 300.0000 ug | ORAL_TABLET | Freq: Every day | ORAL | Status: DC

## 2013-06-14 NOTE — Telephone Encounter (Signed)
Pt called stating that she was here last week to discuss her onset of DM. Pt was so sidetracked about this new diagnosis that she forgot to discuss with Dr Elvera Lennox about her thyroid issues. Pt states she is on meds and has become symptomatic, fatigue, etc. Pt states she probably needs blood work to determine if she needs a change in meds dosage. Pt asked if Dr Elvera Lennox would address this and put in an order for her to come have blood work done. [phone]) Please advise.

## 2013-06-14 NOTE — Telephone Encounter (Signed)
Yes, I wrote her a message - we will check TFTs at next visit, which is <1 mo away.

## 2013-06-15 ENCOUNTER — Encounter: Payer: Self-pay | Admitting: Internal Medicine

## 2013-06-15 ENCOUNTER — Other Ambulatory Visit: Payer: Self-pay | Admitting: *Deleted

## 2013-06-15 ENCOUNTER — Other Ambulatory Visit: Payer: Self-pay | Admitting: Internal Medicine

## 2013-06-15 DIAGNOSIS — E119 Type 2 diabetes mellitus without complications: Secondary | ICD-10-CM

## 2013-06-15 MED ORDER — GLUCOSE BLOOD VI STRP: ORAL_STRIP | Status: DC

## 2013-06-17 ENCOUNTER — Ambulatory Visit: Payer: Self-pay

## 2013-07-12 ENCOUNTER — Ambulatory Visit: Payer: Self-pay | Admitting: Internal Medicine

## 2013-07-12 DIAGNOSIS — Z0289 Encounter for other administrative examinations: Secondary | ICD-10-CM

## 2013-07-15 ENCOUNTER — Encounter: Payer: Self-pay | Admitting: Internal Medicine

## 2013-07-15 NOTE — Progress Notes (Signed)
Received labs from 06/15/2013, from Dr Evelene Croon: - TSH 6.2 (0.4-4.5). - Lipids; 189/204/37/111 - CMP: normal, except Glu 127, A phos 143 (37-121), ALT 95, AST 92. BUN/creatinine 11/0.71.

## 2013-07-21 ENCOUNTER — Ambulatory Visit: Payer: Self-pay

## 2013-08-02 ENCOUNTER — Other Ambulatory Visit: Payer: Self-pay | Admitting: *Deleted

## 2013-09-16 ENCOUNTER — Ambulatory Visit (INDEPENDENT_AMBULATORY_CARE_PROVIDER_SITE_OTHER): Payer: Self-pay | Admitting: Family Medicine

## 2013-09-16 ENCOUNTER — Other Ambulatory Visit: Payer: Self-pay | Admitting: *Deleted

## 2013-09-16 ENCOUNTER — Other Ambulatory Visit: Payer: Self-pay | Admitting: Internal Medicine

## 2013-09-16 DIAGNOSIS — E1310 Other specified diabetes mellitus with ketoacidosis without coma: Secondary | ICD-10-CM

## 2013-09-16 DIAGNOSIS — E1110 Type 2 diabetes mellitus with ketoacidosis without coma: Secondary | ICD-10-CM

## 2013-09-16 MED ORDER — LEVOTHYROXINE SODIUM 300 MCG PO TABS
300.0000 ug | ORAL_TABLET | Freq: Every day | ORAL | Status: DC
Start: 2013-09-16 — End: 2014-01-20

## 2013-09-16 MED ORDER — SITAGLIPTIN PHOSPHATE 100 MG PO TABS: 100.0000 mg | ORAL_TABLET | Freq: Every day | ORAL | Status: DC

## 2013-09-16 MED ORDER — LEVOTHYROXINE SODIUM 50 MCG PO TABS: 50.0000 ug | ORAL_TABLET | Freq: Every day | ORAL | Status: DC

## 2013-09-16 NOTE — Progress Notes (Signed)
Subjective/Objective:  Patient presents today for an initial pharmacy consultation under the employer-sponsored LTW program. Patient is being followed by Peter Garter, RNCM with [facility]. Patient reports "falling off the wagon" over the past few months possibly due to her depression. She has experienced quite a few hypoglycemic episodes since her last visit with Melissa, which she attributes to skipping meals while at work. Patient seems ready to start getting better control of her diabetes now. Patient is currently using metformin ER, Januvia, and Lantus for diabetes medication management.  A1c 8.6% (07/15/13); Ht 5'11"; Wt 310 lbs; Pain 3/10; Did not bring glucometer to appointment but reports fasting blood glucose of 70-100   Assessment:  Patient has been less motivated recently with controlling her diabetes. It is possible that her depression has impacted her motivation to get better control of her diabetes. Patient states that she feels very stressed and overwhelmed when she thinks about all that it takes to control her blood sugars. She also feels a sense of guilt when she actually tries to take care of herself. However, she does have a very good understanding of the impact that uncontrolled diabetes can have on her health. She is currently taking metformin ER, Januvia, and Lantus for diabetes medication management.   Patient reports that she has not been eating regularly and will only have 1-2 meals a day, especially when she works. She admits that she feels guilty taking the time to take care of herself and eat when she needs to eat. States that her job can get very hectic at times, and she is not able to eat at a specific time. Encouraged patient to have healthy snacks available at all times and to start bringing her lunch to work. Patient is very knowledgeable about carb counting and is staying in her carbohydrate limit for meals and snacks. Patient is eating 30-60 carbohydrates per meal currently  and aiming for 15 grams of carbohydrates for snacks. Patient eats a lot of greens, yogurt, nuts, low-fat string cheese, low-fat cottage cheese, ground [place] (no red meat), and fish. Also, reports that she is drinking a lot of water, mainly while she is at work. Patient has not been exercising due to feeling depressed but wants to start swimming again. Patient knows that exercise will make her feel better but states that she has not been taking care of herself lately.   Since having more frequent hypoglycemic episodes, patient decided to decrease her Lantus dose to 15 units at bedtime. However, patient realizes that the hypoglycemia is most likely the result of skipping meals rather than from the Lantus. Patient will discuss this further with Dr. Cruzita Lederer at appointment in March.   Patient had eye exam last March and will make appointment for yearly eye exam for this March. Had dental visit in November. Checks feet daily. Patient takes ACE-inhibitor and daily low-dose ASA.   Plan:  Patient's personal goals are as follows: 1. Work on eating to avoid low blood sugars.  2. Start swimming once a week.  3. Check blood sugar at least twice a day. Post-prandial once a week.  4. Take medication regularly.  5. Call Melissa to schedule next appointment.

## 2013-10-07 ENCOUNTER — Ambulatory Visit: Payer: Self-pay | Admitting: *Deleted

## 2013-10-11 ENCOUNTER — Encounter (HOSPITAL_COMMUNITY): Payer: Self-pay | Admitting: Emergency Medicine

## 2013-10-11 ENCOUNTER — Emergency Department (HOSPITAL_COMMUNITY): Payer: 59

## 2013-10-11 ENCOUNTER — Emergency Department (HOSPITAL_COMMUNITY)
Admission: EM | Admit: 2013-10-11 | Discharge: 2013-10-12 | Disposition: A | Discharge status: Home / Self Care | Payer: 59 | Attending: Emergency Medicine | Admitting: Emergency Medicine

## 2013-10-11 DIAGNOSIS — E119 Type 2 diabetes mellitus without complications: Secondary | ICD-10-CM | POA: Insufficient documentation

## 2013-10-11 DIAGNOSIS — I10 Essential (primary) hypertension: Secondary | ICD-10-CM | POA: Insufficient documentation

## 2013-10-11 DIAGNOSIS — F329 Major depressive disorder, single episode, unspecified: Secondary | ICD-10-CM | POA: Insufficient documentation

## 2013-10-11 DIAGNOSIS — Z7982 Long term (current) use of aspirin: Secondary | ICD-10-CM | POA: Insufficient documentation

## 2013-10-11 DIAGNOSIS — F411 Generalized anxiety disorder: Secondary | ICD-10-CM | POA: Insufficient documentation

## 2013-10-11 DIAGNOSIS — M129 Arthropathy, unspecified: Secondary | ICD-10-CM | POA: Insufficient documentation

## 2013-10-11 DIAGNOSIS — F3289 Other specified depressive episodes: Secondary | ICD-10-CM | POA: Insufficient documentation

## 2013-10-11 DIAGNOSIS — Z79899 Other long term (current) drug therapy: Secondary | ICD-10-CM | POA: Insufficient documentation

## 2013-10-11 DIAGNOSIS — Z87891 Personal history of nicotine dependence: Secondary | ICD-10-CM | POA: Insufficient documentation

## 2013-10-11 DIAGNOSIS — E039 Hypothyroidism, unspecified: Secondary | ICD-10-CM | POA: Insufficient documentation

## 2013-10-11 DIAGNOSIS — Z8719 Personal history of other diseases of the digestive system: Secondary | ICD-10-CM | POA: Insufficient documentation

## 2013-10-11 DIAGNOSIS — Z794 Long term (current) use of insulin: Secondary | ICD-10-CM | POA: Insufficient documentation

## 2013-10-11 DIAGNOSIS — G43109 Migraine with aura, not intractable, without status migrainosus: Secondary | ICD-10-CM | POA: Insufficient documentation

## 2013-10-11 DIAGNOSIS — Z87442 Personal history of urinary calculi: Secondary | ICD-10-CM | POA: Insufficient documentation

## 2013-10-11 LAB — CBC
HCT: 42.7 % (ref 36.0–46.0)
Hemoglobin: 14.6 g/dL (ref 12.0–15.0)
MCH: 30.5 pg (ref 26.0–34.0)
MCHC: 34.2 g/dL (ref 30.0–36.0)
MCV: 89.3 fL (ref 78.0–100.0)
Platelets: 197 K/uL (ref 150–400)
RBC: 4.78 MIL/uL (ref 3.87–5.11)
RDW: 15.7 % — ABNORMAL HIGH (ref 11.5–15.5)
WBC: 8.4 K/uL (ref 4.0–10.5)

## 2013-10-11 LAB — BASIC METABOLIC PANEL
BUN: 11 mg/dL (ref 6–23)
CO2: 25 meq/L (ref 19–32)
Calcium: 9.4 mg/dL (ref 8.4–10.5)
Chloride: 103 mEq/L (ref 96–112)
Creatinine, Ser: 0.7 mg/dL (ref 0.50–1.10)
GFR calc Af Amer: >90 mL/min (ref >90)
GFR calc non Af Amer: >90 mL/min (ref >90)
GLUCOSE: 83 mg/dL (ref 70–99)
POTASSIUM: 3.9 meq/L (ref 3.7–5.3)
SODIUM: 140 meq/L (ref 137–147)

## 2013-10-11 LAB — CBG MONITORING, ED: Glucose-Capillary: 78 mg/dL (ref 70–99)

## 2013-10-11 MED ORDER — DIPHENHYDRAMINE HCL 50 MG/ML IJ SOLN
25.0000 mg | Freq: Once | INTRAMUSCULAR | Status: AC
  Administered 2013-10-11: 25 mg via INTRAVENOUS
  Filled 2013-10-11: qty 1

## 2013-10-11 MED ORDER — METOCLOPRAMIDE HCL 5 MG/ML IJ SOLN
10.0000 mg | Freq: Once | INTRAMUSCULAR | Status: AC
  Administered 2013-10-11: 10 mg via INTRAVENOUS
  Filled 2013-10-11: qty 2

## 2013-10-11 MED ORDER — SODIUM CHLORIDE 0.9 % IV BOLUS (SEPSIS)
1000.0000 mL | Freq: Once | INTRAVENOUS | Status: AC
  Administered 2013-10-11: 1000 mL via INTRAVENOUS

## 2013-10-11 MED ORDER — KETOROLAC TROMETHAMINE 30 MG/ML IJ SOLN
30.0000 mg | Freq: Once | INTRAMUSCULAR | Status: AC
  Administered 2013-10-11: 30 mg via INTRAVENOUS
  Filled 2013-10-11: qty 1

## 2013-10-11 NOTE — ED Notes (Signed)
Pt given PO fluids and crackers. Tolerated well. Pt denies nausea. No vomiting.

## 2013-10-11 NOTE — ED Notes (Signed)
CBG taken = 82

## 2013-10-11 NOTE — ED Provider Notes (Signed)
Medical screening examination/treatment/procedure(s) were performed by non-physician practitioner and as supervising physician I was immediately available for consultation/collaboration.   EKG Interpretation None        Saddie Benders. Dorna Mai, MD 10/11/13 [id number]

## 2013-10-11 NOTE — ED Provider Notes (Signed)
CSN: [id number]     Arrival date & time 10/11/13  1848 History   First MD Initiated Contact with Patient 10/11/13 2124     Chief Complaint  Patient presents with  . Headache     (Consider location/radiation/quality/duration/timing/severity/associated sxs/prior Treatment) Patient is a 48 y.o. female presenting with headaches. The history is provided by the patient and medical records. No language interpreter was used.  Headache Associated symptoms: nausea   Associated symptoms: no abdominal pain, no back pain, no cough, no diarrhea, no fatigue, no fever, no neck stiffness and no vomiting     Josilynn Losh is a 48 y.o. female  with a hx of PCO S., migraine headaches, hypothyroid, GERD, arthritis, anxiety, hypertension, Achilles tendinitis requiring surgery on the right, insulin-dependent diabetes presents to the Emergency Department complaining of gradual, persistent, progressively worsening generalized and throbbing headache onset this morning.  Patient reports she did not take any medications but instead went to bed and slept without relief of her headache. She reports approximately 20 minutes prior to arrival she realized she was having double vision. She also reports intermittent right hand paresthesias for several weeks, but also present today.  She reports that she has attributed her right hand paresthesias to her right shoulder pain. Associated symptoms include nausea without vomiting and decreased appetite.  Light and sound makes the headache worse and nothing makes it better. Patient denies fever, chills, neck pain, neck stiffness, chest pain, shortness of breath, abdominal pain, vomiting, diarrhea, weakness, dizziness, syncope, dysuria, hematuria.  She also reports that several years ago she was treated for chronic migraines by the headache clinic.  She reports that this headache is the same as her previous migraines except for the vision changes.  Past Medical History   Diagnosis Date  . PCOS (polycystic ovarian syndrome)   . History of migraine headaches   . Hypothyroidism   . GERD (gastroesophageal reflux disease)   . History of kidney stones   . Arthritis     back, knees, right elbow  . Anxiety   . High cholesterol     no current med.  . Hypertension     under control with meds., has been on med. x 4 yr.  . Depression   . Achilles tendonitis 02/2013    right  . Acquired Haglund's deformity of right heel 02/2013  . 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 PONV (postoperative nausea and vomiting)     also hx. of emergence delirium 2007  . Insulin resistance     does not check blood sugar at home  . History of electroconvulsive therapy   . Dental crowns present   . Diabetes mellitus without complication    Past Surgical History  Procedure Laterality Date  . Appendectomy    . Pilonidal cyst excision  1994  . Total thyroidectomy  2003  . Ovarian cyst surgery Left   . Lumbar laminectomy      X 3  . Salpingectomy Right 1984  . Achilles tendon surgery Left 2007  . Excision haglund's deformity with achilles tendon repair Right 02/25/2013    Procedure: RIGHT ACHILLES DEBRIDEMENT AND RECONSTRUCTION;  HAGLUND'S EXCISION; GASTROC RECESSION AND FLEXOR HALLUCIS LONGUS TRANSFER;  Surgeon: Wylene Simmer, MD;  Location: [facility];  Service: Orthopedics;  Laterality: Right;   Family History  Problem Relation Age of Onset  . Breast cancer Mother   . Hypertension Mother   . Heart disease Father   . Cancer Brother  THROAT   History  Substance Use Topics  . Smoking status: Former Smoker -- 11 years    Types: Cigarettes  . Smokeless tobacco: Never Used     Comment: 2-3 cig./day  . Alcohol Use: No   OB History   Grav Para Term Preterm Abortions TAB SAB Ect Mult Living   0              Review of Systems  Constitutional: Negative for fever, diaphoresis, appetite change, fatigue and unexpected weight change.  HENT: Negative for  mouth sores.   Eyes: Positive for visual disturbance (diplopia).  Respiratory: Negative for cough, chest tightness, shortness of breath and wheezing.   Cardiovascular: Negative for chest pain.  Gastrointestinal: Positive for nausea. Negative for vomiting, abdominal pain, diarrhea and constipation.  Endocrine: Negative for polydipsia, polyphagia and polyuria.  Genitourinary: Negative for dysuria, urgency, frequency and hematuria.  Musculoskeletal: Negative for back pain and neck stiffness.  Skin: Negative for rash.  Allergic/Immunologic: Negative for immunocompromised state.  Neurological: Positive for headaches. Negative for syncope and light-headedness.  Hematological: Does not bruise/bleed easily.  Psychiatric/Behavioral: Negative for sleep disturbance. The patient is not nervous/anxious.       Allergies  Sulfa antibiotics; Bupropion hcl; and Codeine  Home Medications   Current Outpatient Rx  Name  Route  Sig  Dispense  Refill  . ARIPiprazole (ABILIFY) 5 MG tablet   Oral   Take 5 mg by mouth daily.          Marland Kitchen aspirin 81 MG tablet   Oral   Take 81 mg by mouth daily.         . DULoxetine (CYMBALTA) 60 MG capsule   Oral   Take 60 mg by mouth daily.         Marland Kitchen glucose blood test strip      Test blood glucose 3 times a day - OneTouch Verio   200 each   2     ONE TOUCH VERIO TEST STRIPS   . glucose monitoring kit (FREESTYLE) monitoring kit   Does not apply   1 each by Does not apply route as needed for other.   1 each   0   . insulin glargine (LANTUS) 100 UNIT/ML injection      Inject 18 units daily at bedtime   5 pen   12   . Insulin Pen Needle 32G X 4 MM MISC      Inject once a day   100 each   11   . L-Methylfolate 15 MG TABS   Oral   Take 15 mg by mouth daily.         Marland Kitchen levothyroxine (SYNTHROID, LEVOTHROID) 300 MCG tablet   Oral   Take 1 tablet (300 mcg total) by mouth daily before breakfast. *takes with 41mg tablet for 3533m dose   30  tablet   1   . levothyroxine (SYNTHROID, LEVOTHROID) 50 MCG tablet   Oral   Take 1 tablet (50 mcg total) by mouth daily before breakfast. *takes with 30052mtablet for 350m53mose   30 tablet   1   . lisinopril-hydrochlorothiazide (PRINZIDE,ZESTORETIC) 10-12.5 MG per tablet   Oral   Take 1 tablet by mouth daily.         . metFORMIN (GLUCOPHAGE-XR) 500 MG 24 hr tablet   Oral   Take 1,000 mg by mouth 2 (two) times daily.         . naproxen sodium (ANAPROX) 220 MG tablet  Oral   Take 440 mg by mouth 2 (two) times daily as needed (for pain).         Marland Kitchen sitaGLIPtin (JANUVIA) 100 MG tablet   Oral   Take 1 tablet (100 mg total) by mouth daily.   30 tablet   1    BP 126/70  Pulse 73  Temp(Src) 97.3 F (36.3 C)  Resp 18  SpO2 100%  LMP 10/04/2013 Physical Exam  Nursing note and vitals reviewed. Constitutional: She is oriented to person, place, and time. She appears well-developed and well-nourished. No distress.  Awake, alert, nontoxic appearance  HENT:  Head: Normocephalic and atraumatic.  Mouth/Throat: Oropharynx is clear and moist. No oropharyngeal exudate.  Eyes: Conjunctivae and EOM are normal. Pupils are equal, round, and reactive to light. No scleral icterus.  Visual Acuity (corrected): R - 20/40 L - 20/40 B - 20/40  Neck: Normal range of motion and full passive range of motion without pain. Neck supple. No spinous process tenderness and no muscular tenderness present. No rigidity. Normal range of motion present.  Full range of motion without pain No midline or paraspinal tenderness No nuchal rigidity No meningeal signs  Cardiovascular: Normal rate, regular rhythm, normal heart sounds and intact distal pulses.   No murmur heard. No tachycardia  Pulmonary/Chest: Effort normal and breath sounds normal. No respiratory distress. She has no wheezes. She has no rales.  Clear and equal breath sounds  Abdominal: Soft. Bowel sounds are normal. She exhibits no mass.  There is no tenderness. There is no rebound and no guarding.  Soft and nontender abdomen  Musculoskeletal: Normal range of motion. She exhibits no edema.  Lymphadenopathy:    She has no cervical adenopathy.  Neurological: She is alert and oriented to person, place, and time. She has normal reflexes. No cranial nerve deficit. She exhibits normal muscle tone. Coordination normal.  Speech is clear and goal oriented, follows commands Cranial nerves III - XII without deficit, no facial droop Normal strength in upper and lower extremities bilaterally, strong and equal grip strength Reports altered sensation to the right hand alone and with intact 2 point discrimination and sharp and dull sensation equal bilaterally. Moves extremities without ataxia, coordination intact Normal finger to nose and rapid alternating movements Neg romberg, no pronator drift Normal gait Normal heel-shin and balance   Skin: Skin is warm and dry. No rash noted. She is not diaphoretic. No erythema.  No petechiae or purpura  Psychiatric: She has a normal mood and affect. Her behavior is normal. Judgment and thought content normal.    ED Course  Procedures (including critical care time) Labs Review Labs Reviewed  CBC - Abnormal; Notable for the following:    RDW 15.7 (*)    All other components within normal limits  BASIC METABOLIC PANEL  CBG MONITORING, ED  CBG MONITORING, ED   Imaging Review Ct Head Wo Contrast  10/11/2013   CLINICAL DATA:  Headache.  EXAM: CT HEAD WITHOUT CONTRAST  TECHNIQUE: Contiguous axial images were obtained from the base of the skull through the vertex without intravenous contrast.  COMPARISON:  CT scan of Dec 25, 2011.  FINDINGS: Bony calvarium appears intact. No mass effect or midline shift is noted. Ventricular size is within normal limits. There is no evidence of mass lesion, hemorrhage or acute infarction.  IMPRESSION: No gross intracranial abnormality seen.   Electronically Signed   By:  Sabino Dick M.D.   On: 10/11/2013 23:19     EKG Interpretation  None      MDM   Final diagnoses:  Complicated migraine    Kortney Potvin presents with headache similar to previous migraines but with reports of diplopia.  Patient with long-standing history of migraines but none for several years.  Denies history of complicated migraines. Patient without emesis.  Reports of right hand paresthesias in his right shoulder pain; not reproducible.  No other focal neurologic deficits.  Will give migraine cocktail and obtain head CT.  11:23 PM Patient reports resolved headache, nausea and diplopia.  She reports improving hand paresthesias as well.  She requests a by mouth trial. Head CT pending.  11:50 PM Pt HA treated and improved while in ED.  Pt's right hand paresthesias have resolved.  Presentation similar to typical HA and non concerning for [facility], ICH, Meningitis, or temporal arteritis. Pt is afebrile with no focal neuro deficits, nuchal rigidity, or change in vision. Tolerating by mouth without difficulty. Labs unremarkable. CT head without acute abnormality.   I personally reviewed the imaging tests through PACS system.  I reviewed available ER/hospitalization records through the EMR.  Pt is to follow up with PCP or headache clinic to discuss prophylactic medication. Pt verbalizes understanding and is agreeable with plan to dc.   It has been determined that no acute conditions requiring further emergency intervention are present at this time. The patient/guardian have been advised of the diagnosis and plan. We have discussed signs and symptoms that warrant return to the ED, such as changes or worsening in symptoms.   Vital signs are stable at discharge.   BP 126/70  Pulse 73  Temp(Src) 97.3 F (36.3 C)  Resp 18  SpO2 100%  LMP 10/04/2013  Patient/guardian has voiced understanding and agreed to follow-up with the PCP or specialist.        Abigail Butts,  PA-C 10/11/13 2354

## 2013-10-11 NOTE — Discharge Instructions (Signed)
1. Medications: usual home medications 2. Treatment: rest, drink plenty of fluids,  3. Follow Up: Please followup with your primary doctor for discussion of your diagnoses and further evaluation after today's visit; Please also follow-up with your headache clinic    Migraine Headache A migraine headache is an intense, throbbing pain on one or both sides of your head. A migraine can last for 30 minutes to several hours. CAUSES  The exact cause of a migraine headache is not always known. However, a migraine may be caused when nerves in the brain become irritated and release chemicals that cause inflammation. This causes pain. Certain things may also trigger migraines, such as:  Alcohol.  Smoking.  Stress.  Menstruation.  Aged cheeses.  Foods or drinks that contain nitrates, glutamate, aspartame, or tyramine.  Lack of sleep.  Chocolate.  Caffeine.  Hunger.  Physical exertion.  Fatigue.  Medicines used to treat chest pain (nitroglycerine), birth control pills, estrogen, and some blood pressure medicines. SIGNS AND SYMPTOMS  Pain on one or both sides of your head.  Pulsating or throbbing pain.  Severe pain that prevents daily activities.  Pain that is aggravated by any physical activity.  Nausea, vomiting, or both.  Dizziness.  Pain with exposure to bright lights, loud noises, or activity.  General sensitivity to bright lights, loud noises, or smells. Before you get a migraine, you may get warning signs that a migraine is coming (aura). An aura may include:  Seeing flashing lights.  Seeing bright spots, halos, or zig-zag lines.  Having tunnel vision or blurred vision.  Having feelings of numbness or tingling.  Having trouble talking.  Having muscle weakness. DIAGNOSIS  A migraine headache is often diagnosed based on:  Symptoms.  Physical exam.  A CT scan or MRI of your head. These imaging tests cannot diagnose migraines, but they can help rule out  other causes of headaches. TREATMENT Medicines may be given for pain and nausea. Medicines can also be given to help prevent recurrent migraines.  HOME CARE INSTRUCTIONS  Only take over-the-counter or prescription medicines for pain or discomfort as directed by your health care provider. The use of long-term narcotics is not recommended.  Lie down in a dark, quiet room when you have a migraine.  Keep a journal to find out what may trigger your migraine headaches. For example, write down:  What you eat and drink.  How much sleep you get.  Any change to your diet or medicines.  Limit alcohol consumption.  Quit smoking if you smoke.  Get 7 9 hours of sleep, or as recommended by your health care provider.  Limit stress.  Keep lights dim if bright lights bother you and make your migraines worse. SEEK IMMEDIATE MEDICAL CARE IF:   Your migraine becomes severe.  You have a fever.  You have a stiff neck.  You have vision loss.  You have muscular weakness or loss of muscle control.  You start losing your balance or have trouble walking.  You feel faint or pass out.  You have severe symptoms that are different from your first symptoms. MAKE SURE YOU:   Understand these instructions.  Will watch your condition.  Will get help right away if you are not doing well or get worse. Document Released: 07/22/2005 Document Revised: 05/12/2013 Document Reviewed: 03/29/2013 [facility] Patient Information 2014 [place].

## 2013-10-11 NOTE — ED Notes (Signed)
CBG Taken = 78

## 2013-10-11 NOTE — ED Notes (Signed)
Per pt sts HA that started today associated with nausea. sts also some double vision. sts photophobia

## 2013-10-11 NOTE — ED Notes (Signed)
Patient asked for and received a diet ginger ale.

## 2013-10-12 LAB — CBG MONITORING, ED: GLUCOSE-CAPILLARY: 82 mg/dL (ref 70–99)

## 2013-12-08 ENCOUNTER — Other Ambulatory Visit: Payer: Self-pay | Admitting: *Deleted

## 2013-12-08 MED ORDER — GLUCOSE BLOOD VI STRP: ORAL_STRIP | Status: DC

## 2014-01-18 ENCOUNTER — Emergency Department (HOSPITAL_COMMUNITY)
Admission: EM | Admit: 2014-01-18 | Discharge: 2014-01-18 | Disposition: A | Discharge status: Home / Self Care | Payer: 59 | Attending: Emergency Medicine | Admitting: Emergency Medicine

## 2014-01-18 ENCOUNTER — Encounter (HOSPITAL_COMMUNITY): Payer: Self-pay | Admitting: Emergency Medicine

## 2014-01-18 DIAGNOSIS — E119 Type 2 diabetes mellitus without complications: Secondary | ICD-10-CM | POA: Insufficient documentation

## 2014-01-18 DIAGNOSIS — E039 Hypothyroidism, unspecified: Secondary | ICD-10-CM | POA: Insufficient documentation

## 2014-01-18 DIAGNOSIS — K219 Gastro-esophageal reflux disease without esophagitis: Secondary | ICD-10-CM | POA: Insufficient documentation

## 2014-01-18 DIAGNOSIS — E7800 Pure hypercholesterolemia, unspecified: Secondary | ICD-10-CM | POA: Insufficient documentation

## 2014-01-18 DIAGNOSIS — Z9089 Acquired absence of other organs: Secondary | ICD-10-CM | POA: Insufficient documentation

## 2014-01-18 DIAGNOSIS — Z794 Long term (current) use of insulin: Secondary | ICD-10-CM | POA: Insufficient documentation

## 2014-01-18 DIAGNOSIS — Z79899 Other long term (current) drug therapy: Secondary | ICD-10-CM | POA: Insufficient documentation

## 2014-01-18 DIAGNOSIS — F3289 Other specified depressive episodes: Secondary | ICD-10-CM | POA: Insufficient documentation

## 2014-01-18 DIAGNOSIS — M549 Dorsalgia, unspecified: Secondary | ICD-10-CM | POA: Insufficient documentation

## 2014-01-18 DIAGNOSIS — M129 Arthropathy, unspecified: Secondary | ICD-10-CM | POA: Insufficient documentation

## 2014-01-18 DIAGNOSIS — F329 Major depressive disorder, single episode, unspecified: Secondary | ICD-10-CM | POA: Insufficient documentation

## 2014-01-18 DIAGNOSIS — Z87442 Personal history of urinary calculi: Secondary | ICD-10-CM | POA: Insufficient documentation

## 2014-01-18 DIAGNOSIS — R112 Nausea with vomiting, unspecified: Secondary | ICD-10-CM | POA: Insufficient documentation

## 2014-01-18 DIAGNOSIS — F411 Generalized anxiety disorder: Secondary | ICD-10-CM | POA: Insufficient documentation

## 2014-01-18 DIAGNOSIS — Z7982 Long term (current) use of aspirin: Secondary | ICD-10-CM | POA: Insufficient documentation

## 2014-01-18 DIAGNOSIS — R109 Unspecified abdominal pain: Secondary | ICD-10-CM

## 2014-01-18 DIAGNOSIS — Z87891 Personal history of nicotine dependence: Secondary | ICD-10-CM | POA: Insufficient documentation

## 2014-01-18 DIAGNOSIS — Z9889 Other specified postprocedural states: Secondary | ICD-10-CM | POA: Insufficient documentation

## 2014-01-18 LAB — URINALYSIS, ROUTINE W REFLEX MICROSCOPIC
Bilirubin Urine: NEGATIVE
GLUCOSE, UA: 250 mg/dL — AB
Hgb urine dipstick: NEGATIVE
KETONES UR: NEGATIVE mg/dL
LEUKOCYTES UA: NEGATIVE
NITRITE: NEGATIVE
PROTEIN: NEGATIVE mg/dL
Specific Gravity, Urine: 1.034 — ABNORMAL HIGH (ref 1.005–1.030)
UROBILINOGEN UA: 0.2 mg/dL (ref 0.0–1.0)
pH: 5 (ref 5.0–8.0)

## 2014-01-18 MED ORDER — HYDROMORPHONE HCL PF 1 MG/ML IJ SOLN
1.0000 mg | Freq: Once | INTRAMUSCULAR | Status: AC
  Administered 2014-01-18: 1 mg via INTRAVENOUS

## 2014-01-18 MED ORDER — SODIUM CHLORIDE 0.9 % IV BOLUS (SEPSIS)
1000.0000 mL | Freq: Once | INTRAVENOUS | Status: AC
  Administered 2014-01-18: 1000 mL via INTRAVENOUS

## 2014-01-18 MED ORDER — ONDANSETRON 4 MG PO TBDP: ORAL_TABLET | ORAL | Status: DC

## 2014-01-18 MED ORDER — ONDANSETRON HCL 4 MG/2ML IJ SOLN
4.0000 mg | Freq: Once | INTRAMUSCULAR | Status: AC
  Administered 2014-01-18: 4 mg via INTRAVENOUS
  Filled 2014-01-18: qty 2

## 2014-01-18 MED ORDER — KETOROLAC TROMETHAMINE 30 MG/ML IJ SOLN
30.0000 mg | Freq: Once | INTRAMUSCULAR | Status: AC
  Administered 2014-01-18: 30 mg via INTRAVENOUS
  Filled 2014-01-18: qty 1

## 2014-01-18 MED ORDER — HYDROMORPHONE HCL PF 1 MG/ML IJ SOLN
1.0000 mg | Freq: Once | INTRAMUSCULAR | Status: DC
  Filled 2014-01-18: qty 1

## 2014-01-18 MED ORDER — TRAMADOL HCL 50 MG PO TABS: 50.0000 mg | ORAL_TABLET | Freq: Four times a day (QID) | ORAL | Status: DC | PRN

## 2014-01-18 NOTE — ED Notes (Signed)
Pt states around 12 pm today she started have right sided flank pain. Pt states she thinks she has a kidney stones. Pt vomiting during assessment.

## 2014-01-18 NOTE — ED Provider Notes (Signed)
CSN: [id number]     Arrival date & time 01/18/14  1507 History   First MD Initiated Contact with Patient 01/18/14 1511     Chief Complaint  Patient presents with  . Flank Pain     (Consider location/radiation/quality/duration/timing/severity/associated sxs/prior Treatment) HPI Comments: 48 year old female with a past medical history of kidney stones, PCOS, hypothyroidism, anxiety, hypertension, depression and diabetes presents to the emergency department complaining of sudden onset right-sided back pain radiating around her right flank beginning around 12:00 PM today. Patient is a nurse in the hospital and came directly down to the emergency department when the pain began. States pain is described as "sharp and gnawing", constant and severe. States this feels the same as her prior kidney stone in the past. Admits to associated nausea and vomiting. When the pain began she thought she had to urinate, she went to urinate and the pain got worse. She was able to urinate without any difficulty. Denies increased urinary frequency some urgency, dysuria, hematuria, fever, chills, abdominal pain or diarrhea. No aggravating or alleviating factors. Last kidney stone was 6-7 years ago.  The history is provided by the patient.    Past Medical History  Diagnosis Date  . PCOS (polycystic ovarian syndrome)   . History of migraine headaches   . Hypothyroidism   . GERD (gastroesophageal reflux disease)   . History of kidney stones   . Arthritis     back, knees, right elbow  . Anxiety   . High cholesterol     no current med.  . Hypertension     under control with meds., has been on med. x 4 yr.  . Depression   . Achilles tendonitis 02/2013    right  . Acquired Haglund's deformity of right heel 02/2013  . 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 PONV (postoperative nausea and vomiting)     also hx. of emergence delirium 2007  . Insulin resistance     does not check blood sugar at home  . History of  electroconvulsive therapy   . Dental crowns present   . Diabetes mellitus without complication    Past Surgical History  Procedure Laterality Date  . Appendectomy    . Pilonidal cyst excision  1994  . Total thyroidectomy  2003  . Ovarian cyst surgery Left   . Lumbar laminectomy      X 3  . Salpingectomy Right 1984  . Achilles tendon surgery Left 2007  . Excision haglund's deformity with achilles tendon repair Right 02/25/2013    Procedure: RIGHT ACHILLES DEBRIDEMENT AND RECONSTRUCTION;  HAGLUND'S EXCISION; GASTROC RECESSION AND FLEXOR HALLUCIS LONGUS TRANSFER;  Surgeon: Wylene Simmer, MD;  Location: [place];  Service: Orthopedics;  Laterality: Right;   Family History  Problem Relation Age of Onset  . Breast cancer Mother   . Hypertension Mother   . Heart disease Father   . Cancer Brother     THROAT   History  Substance Use Topics  . Smoking status: Former Smoker -- 11 years    Types: Cigarettes  . Smokeless tobacco: Never Used     Comment: 2-3 cig./day  . Alcohol Use: No   OB History   Grav Para Term Preterm Abortions TAB SAB Ect Mult Living   0              Review of Systems  Gastrointestinal: Positive for nausea and vomiting.  Genitourinary: Positive for flank pain.  Musculoskeletal: Positive for back pain.  All  other systems reviewed and are negative.     Allergies  Sulfa antibiotics; Bupropion hcl; and Codeine  Home Medications   Prior to Admission medications   Medication Sig Start Date End Date Taking? Authorizing Provider  ARIPiprazole (ABILIFY) 5 MG tablet Take 5 mg by mouth daily.    Yes Historical Provider, MD  aspirin 81 MG tablet Take 81 mg by mouth daily.   Yes Historical Provider, MD  DULoxetine (CYMBALTA) 60 MG capsule Take 60 mg by mouth daily.   Yes Historical Provider, MD  insulin glargine (LANTUS) 100 UNIT/ML injection Inject 18 units daily at bedtime 06/10/13  Yes Philemon Kingdom, MD  L-Methylfolate 15 MG TABS Take 15 mg by  mouth daily.   Yes Historical Provider, MD  levothyroxine (SYNTHROID, LEVOTHROID) 300 MCG tablet Take 1 tablet (300 mcg total) by mouth daily before breakfast. *takes with 11mg tablet for 3570m dose 09/16/13  Yes CrPhilemon KingdomMD  levothyroxine (SYNTHROID, LEVOTHROID) 50 MCG tablet Take 1 tablet (50 mcg total) by mouth daily before breakfast. *takes with 30085mtablet for 350m69mose 09/16/13  Yes CrisPhilemon Kingdom  lisinopril-hydrochlorothiazide (PRINZIDE,ZESTORETIC) 10-12.5 MG per tablet Take 1 tablet by mouth daily.   Yes Historical Provider, MD  metFORMIN (GLUCOPHAGE-XR) 500 MG 24 hr tablet Take 1,000 mg by mouth 2 (two) times daily.   Yes Historical Provider, MD  ranitidine (ZANTAC) 150 MG tablet Take 150 mg by mouth 2 (two) times daily as needed for heartburn.   Yes Historical Provider, MD  sitaGLIPtin (JANUVIA) 100 MG tablet Take 1 tablet (100 mg total) by mouth daily. 09/16/13  Yes CrisPhilemon Kingdom  glucose blood (ONETOUCH VERIO) test strip Use to test blood glucose 3 times a day as instructed. 12/08/13   CrisPhilemon Kingdom  glucose blood test strip Test blood glucose 3 times a day - OneTouch Verio 06/15/13   CrisPhilemon Kingdom  glucose monitoring kit (FREESTYLE) monitoring kit 1 each by Does not apply route as needed for other. 06/01/13   AaroKatheren Shams  Insulin Pen Needle 32G X 4 MM MISC Inject once a day 06/10/13   CrisPhilemon Kingdom  ondansetron (ZOFRAN ODT) 4 MG disintegrating tablet 4mg 80m q4 hours prn nausea/vomit 01/18/14   RobynIllene LabradorC  traMADol (ULTRAM) 50 MG tablet Take 1 tablet (50 mg total) by mouth every 6 (six) hours as needed. 01/18/14   RobynIllene LabradorC   BP 126/76  Pulse 98  Temp(Src) 97.6 F (36.4 C) (Oral)  Resp 28  Wt 310 lb (140.615 kg)  SpO2 100% Physical Exam  Nursing note and vitals reviewed. Constitutional: She is oriented to person, place, and time. She appears well-developed and well-nourished.  Morbidly obese. Appears  uncomfortable, actively vomiting.  HENT:  Head: Normocephalic and atraumatic.  Mouth/Throat: Oropharynx is clear and moist.  Eyes: Conjunctivae are normal. No scleral icterus.  Neck: Normal range of motion. Neck supple.  Cardiovascular: Normal rate, regular rhythm and normal heart sounds.   Pulmonary/Chest: Effort normal and breath sounds normal.  Abdominal: Soft. Bowel sounds are normal. There is tenderness. There is CVA tenderness (right). There is no rigidity, no rebound and no guarding.    No peritoneal signs.  Musculoskeletal: Normal range of motion. She exhibits no edema.  Neurological: She is alert and oriented to person, place, and time.  Skin: Skin is warm and dry. She is not diaphoretic.  Psychiatric: She has a normal mood and affect. Her behavior is normal.    ED  Course  Procedures (including critical care time) Labs Review Labs Reviewed  URINALYSIS, ROUTINE W REFLEX MICROSCOPIC - Abnormal; Notable for the following:    Color, Urine AMBER (*)    APPearance CLOUDY (*)    Specific Gravity, Urine 1.034 (*)    Glucose, UA 250 (*)    All other components within normal limits    Imaging Review No results found.   EKG Interpretation None      MDM   Final diagnoses:  Right flank pain   Patient presenting with sudden onset right-sided back pain radiating around her right flank. History of kidney stones and symptoms are similar. Plan to check urine, control pain and nausea. Pt would prefer no CT. 6:03 PM Patient reports her pain has completely subsided. She is no longer nauseated and tolerating PO. On repeat exam, she still has some tenderness around her right flank, however improved from initial exam. UA without infection or blood. I discussed this with pt that pain may not be from kidney stone, however pt states this pain feels the same as her prior. Hx of appendectomy, ovarian cyst surgery and salpingectomy. She still has her ovaries. Possibility this may be ovarian  in nature. She is comfortable going home and f/u with her PCP. Will d/c with ultram and zofran. Pt does not want narcotic medications. Stable for d/c. Return precautions given. Patient states understanding of treatment care plan and is agreeable.  Illene Labrador, PA-C 01/18/14 1806

## 2014-01-18 NOTE — Discharge Instructions (Signed)
Take tramadol as directed as needed for pain. Use zofran as directed as needed for nausea.  Abdominal Pain, Women Abdominal (stomach, pelvic, or belly) pain can be caused by many things. It is important to tell your doctor:  The location of the pain.  Does it come and go or is it present all the time?  Are there things that start the pain (eating certain foods, exercise)?  Are there other symptoms associated with the pain (fever, nausea, vomiting, diarrhea)? All of this is helpful to know when trying to find the cause of the pain. CAUSES   Stomach: virus or bacteria infection, or ulcer.  Intestine: appendicitis (inflamed appendix), regional ileitis (Crohn's disease), ulcerative colitis (inflamed colon), irritable bowel syndrome, diverticulitis (inflamed diverticulum of the colon), or cancer of the stomach or intestine.  Gallbladder disease or stones in the gallbladder.  Kidney disease, kidney stones, or infection.  Pancreas infection or cancer.  Fibromyalgia (pain disorder).  Diseases of the female organs:  Uterus: fibroid (non-cancerous) tumors or infection.  Fallopian tubes: infection or tubal pregnancy.  Ovary: cysts or tumors.  Pelvic adhesions (scar tissue).  Endometriosis (uterus lining tissue growing in the pelvis and on the pelvic organs).  Pelvic congestion syndrome (female organs filling up with blood just before the menstrual period).  Pain with the menstrual period.  Pain with ovulation (producing an egg).  Pain with an IUD (intrauterine device, birth control) in the uterus.  Cancer of the female organs.  Functional pain (pain not caused by a disease, may improve without treatment).  Psychological pain.  Depression. DIAGNOSIS  Your doctor will decide the seriousness of your pain by doing an examination.  Blood tests.  X-rays.  Ultrasound.  CT scan (computed tomography, special type of X-ray).  MRI (magnetic resonance imaging).  Cultures,  for infection.  Barium enema (dye inserted in the large intestine, to better view it with X-rays).  Colonoscopy (looking in intestine with a lighted tube).  Laparoscopy (minor surgery, looking in abdomen with a lighted tube).  Major abdominal exploratory surgery (looking in abdomen with a large incision). TREATMENT  The treatment will depend on the cause of the pain.   Many cases can be observed and treated at home.  Over-the-counter medicines recommended by your caregiver.  Prescription medicine.  Antibiotics, for infection.  Birth control pills, for painful periods or for ovulation pain.  Hormone treatment, for endometriosis.  Nerve blocking injections.  Physical therapy.  Antidepressants.  Counseling with a psychologist or psychiatrist.  Minor or major surgery. HOME CARE INSTRUCTIONS   Do not take laxatives, unless directed by your caregiver.  Take over-the-counter pain medicine only if ordered by your caregiver. Do not take aspirin because it can cause an upset stomach or bleeding.  Try a clear liquid diet (broth or water) as ordered by your caregiver. Slowly move to a bland diet, as tolerated, if the pain is related to the stomach or intestine.  Have a thermometer and take your temperature several times a day, and record it.  Bed rest and sleep, if it helps the pain.  Avoid sexual intercourse, if it causes pain.  Avoid stressful situations.  Keep your follow-up appointments and tests, as your caregiver orders.  If the pain does not go away with medicine or surgery, you may try:  Acupuncture.  Relaxation exercises (yoga, meditation).  Group therapy.  Counseling. SEEK MEDICAL CARE IF:   You notice certain foods cause stomach pain.  Your home care treatment is not helping your pain.  You need stronger pain medicine.  You want your IUD removed.  You feel faint or lightheaded.  You develop nausea and vomiting.  You develop a rash.  You are  having side effects or an allergy to your medicine. SEEK IMMEDIATE MEDICAL CARE IF:   Your pain does not go away or gets worse.  You have a fever.  Your pain is felt only in portions of the abdomen. The right side could possibly be appendicitis. The left lower portion of the abdomen could be colitis or diverticulitis.  You are passing blood in your stools (bright red or black tarry stools, with or without vomiting).  You have blood in your urine.  You develop chills, with or without a fever.  You pass out. MAKE SURE YOU:   Understand these instructions.  Will watch your condition.  Will get help right away if you are not doing well or get worse. Document Released: 05/19/2007 Document Revised: 10/14/2011 Document Reviewed: 06/08/2009 [facility] Patient Information 2014 [place], [place].  Flank Pain Flank pain refers to pain that is located on the side of the body between the upper abdomen and the back. The pain may occur over a short period of time (acute) or may be long-term or reoccurring (chronic). It may be mild or severe. Flank pain can be caused by many things. CAUSES  Some of the more common causes of flank pain include:  Muscle strains.   Muscle spasms.   A disease of your spine (vertebral disk disease).   A lung infection (pneumonia).   Fluid around your lungs (pulmonary edema).   A kidney infection.   Kidney stones.   A very painful skin rash caused by the chickenpox virus (shingles).   Gallbladder disease.  [place] care will depend on the cause of your pain. In general,  Rest as directed by your caregiver.  Drink enough fluids to keep your urine clear or pale yellow.  Only take over-the-counter or prescription medicines as directed by your caregiver. Some medicines may help relieve the pain.  Tell your caregiver about any changes in your pain.  Follow up with your caregiver as directed. SEEK IMMEDIATE MEDICAL CARE IF:     Your pain is not controlled with medicine.   You have new or worsening symptoms.  Your pain increases.   You have abdominal pain.   You have shortness of breath.   You have persistent nausea or vomiting.   You have swelling in your abdomen.   You feel faint or pass out.   You have blood in your urine.  You have a fever or persistent symptoms for more than 2 3 days.  You have a fever and your symptoms suddenly get worse. MAKE SURE YOU:   Understand these instructions.  Will watch your condition.  Will get help right away if you are not doing well or get worse. Document Released: 09/12/2005 Document Revised: 04/15/2012 Document Reviewed: 03/05/2012 [facility] Patient Information 2014 [place].

## 2014-01-19 NOTE — ED Provider Notes (Signed)
Medical screening examination/treatment/procedure(s) were performed by non-physician practitioner and as supervising physician I was immediately available for consultation/collaboration.   EKG Interpretation None       Richarda Blade, MD 01/19/14 1109

## 2014-01-20 ENCOUNTER — Ambulatory Visit (INDEPENDENT_AMBULATORY_CARE_PROVIDER_SITE_OTHER): Payer: 59 | Admitting: Internal Medicine

## 2014-01-20 ENCOUNTER — Encounter: Payer: Self-pay | Admitting: Internal Medicine

## 2014-01-20 VITALS — BP 112/68 | HR 80 | Temp 98.6°F | Resp 12 | Wt 335.0 lb

## 2014-01-20 DIAGNOSIS — IMO0001 Reserved for inherently not codable concepts without codable children: Secondary | ICD-10-CM

## 2014-01-20 DIAGNOSIS — E119 Type 2 diabetes mellitus without complications: Secondary | ICD-10-CM

## 2014-01-20 DIAGNOSIS — E039 Hypothyroidism, unspecified: Secondary | ICD-10-CM

## 2014-01-20 DIAGNOSIS — IMO0002 Reserved for concepts with insufficient information to code with codable children: Secondary | ICD-10-CM

## 2014-01-20 DIAGNOSIS — E1165 Type 2 diabetes mellitus with hyperglycemia: Secondary | ICD-10-CM

## 2014-01-20 DIAGNOSIS — E890 Postprocedural hypothyroidism: Secondary | ICD-10-CM

## 2014-01-20 LAB — TSH: TSH: 3 u[IU]/mL (ref 0.35–4.50)

## 2014-01-20 LAB — HEMOGLOBIN A1C: Hgb A1c MFr Bld: 7.4 % — ABNORMAL HIGH (ref 4.6–6.5)

## 2014-01-20 LAB — T4, FREE: Free T4: 0.91 ng/dL (ref 0.60–1.60)

## 2014-01-20 MED ORDER — LEVOTHYROXINE SODIUM 300 MCG PO TABS: 150.0000 ug | ORAL_TABLET | Freq: Every day | ORAL | Status: DC

## 2014-01-20 MED ORDER — INSULIN GLARGINE 100 UNIT/ML ~~LOC~~ SOLN: SUBCUTANEOUS | Status: DC

## 2014-01-20 NOTE — Patient Instructions (Signed)
Please decrease the Levothyroxine dose to 200 mcg daily (150 mcg + 50 mcg). Please take the Levothyroxine always in am, fasting, EVERY DAY, with water, >30 min before b'fast, separated by >4h from anti acid medication, calcium, iron, multivitamins.  Please continue: - Metformin XR 1000 mg 2x a day - Lantus 16 units at bedtime - Januvia 100 mg daily in am Write sugars down and bring at next visit in 1 month.  Please stop at the lab.

## 2014-01-20 NOTE — Progress Notes (Signed)
Patient ID: Tracy [name], female   DOB: [birth year], 48 y.o.   MRN: [id number]  HPI: Tracy [name] is a 48 y.o.-year-old female, returning for f/u for DM2, dx in 06/01/2013, uncontrolled, without complications. She also wants me to start managing her hypothyroidism. Last visit 8 mo ago!   She was in the ED 2 days ago for R sided back pain radiating down the leg and nausea. Pain is better now. She still feels poorly now. Started Zofran and Ultram.  DM2: Last hemoglobin A1c was (06/04/2013) 11.4%. Lab Results  Component Value Date   HGBA1C 5.7 07/04/2011   HGBA1C 5.4 02/08/2010   HGBA1C 5.1 02/16/2009   Pt is on a regimen of: - Metformin XR 1000 bid. She did not try the immediate release - Lantus 16 units at bedtime. - Januvia 100 mg daily in am. We stopped Glipizide and added Januvia.  Pt checks her sugars 2x a day, not recently though (in last 1 week) as she lost her meter's changer: - am: (65) 80-114 - bedtime: 140-160 No lows. Lowest sugar was 175 >> now 65; ? Level of hypoglycemia awareness Uses a One Touch Verio IQ meter.  Pt's meals are: - Breakfast: scrambled eggs and cheese, piece of toast, grapes, coffee, V8, water - Lunch: salad with chicken or [place] sandwich, cheese cubes, water - Dinner: [place] meatloaf, or small piece of pizza, salad and water - Snacks: 1-2 a day She went to the diabetes class last week. She also sees [place] in [place] to [employer].  - no CKD, last BUN/creatinine:  Lab Results  Component Value Date   BUN 11 10/11/2013   CREATININE 0.70 10/11/2013  - last eye exam was in Spring 2014. No DR.  - no numbness and tingling in her feet.  She also has a history of PCOS for which she took Metformin. Menstrual cycles irregular: approx. 4x a year.   Hypothyroidism: - she is on Levothyroxine 350 mcg in am but sometimes at night - takes Zantac 2x a day - along with Levothyroxine  ROS: Constitutional: + weight gain, + fatigue, + subjective  hyperthermia, + hot flashes Eyes:+ blurry vision, no xerophthalmia ENT: no sore throat, no nodules palpated in throat, no dysphagia/odynophagia, no hoarseness Cardiovascular: no CP/SOB/palpitations/+ leg swelling Respiratory: no cough/SOB Gastrointestinal: no N/no V/+D/no C/ + heartburn Musculoskeletal: + muscle aches/+ joint aches Skin: no rashes Neurological: no tremors/numbness/tingling/dizziness, + HA  Past Medical History  Diagnosis Date  . PCOS (polycystic ovarian syndrome)   . History of migraine headaches   . Hypothyroidism   . GERD (gastroesophageal reflux disease)   . History of kidney stones   . Arthritis     back, knees, right elbow  . Anxiety   . High cholesterol     no current med.  . Hypertension     under control with meds., has been on med. x 4 yr.  . Depression   . Achilles tendonitis 02/2013    right  . Acquired Haglund's deformity of right heel 02/2013  . 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 PONV (postoperative nausea and vomiting)     also hx. of emergence delirium 2007  . Insulin resistance     does not check blood sugar at home  . History of electroconvulsive therapy   . Dental crowns present   . Diabetes mellitus without complication   Patient also mentioned papillary thyroid cancer, however, per review of the thyroid pathology after thyroidectomy, this was benign  I  reviewed pt's medications, allergies, PMH, social hx, family hx and no changes required, except as mentioned above.  PE: BP 112/68  Pulse 80  Temp(Src) 98.6 F (37 C) (Oral)  Resp 12  Wt 335 lb (151.955 kg)  SpO2 96% Wt Readings from Last 3 Encounters:  01/20/14 335 lb (151.955 kg)  01/18/14 310 lb (140.615 kg)  06/10/13 320 lb 11.2 oz (145.469 kg)  ?? If weight correct  Constitutional: obese, in NAD Eyes: PERRLA, EOMI, no exophthalmos ENT: moist mucous membranes, no thyromegaly, no cervical lymphadenopathy Cardiovascular: RRR, No MRG Respiratory: CTA B Gastrointestinal:  abdomen soft, NT, ND, BS+ Musculoskeletal: no deformities, strength intact in all 4 Skin: moist, warm, no rashes Neurological: no tremor with outstretched hands, DTR normal in all 4  ASSESSMENT: 1. DM2, uncontrolled, without complications  2. Hypothyroidism - post-surgical  PLAN:  1. Patient with uncontrolled DM2, on oral antidiabetic regimen, which appears sufficient. Sugars almost all at goal. - We discussed about options for treatment, and I suggested to:  Patient Instructions  Please continue: - Metformin XR 1000 mg 2x a day - Lantus 16 units at bedtime - Januvia 100 mg daily in am Write sugars down and bring at next visit in 1 month. Please stop at the lab. - Strongly advised her to start checking sugars at different times of the day - check 3 times a day, rotating checks - advised for yearly eye exams - Return to clinic in 1 mo with sugar log   2. Hypothyroidism - post-surgical - will separated the Levothyroxine from the Ranitidine and decrease the dose to 200 mcg daily - check TSH and fT4 now - will also recheck them in 1 month when she comes back  Office Visit on 01/20/2014  Component Date Value Ref Range Status  . TSH 01/20/2014 3.00  0.35 - 4.50 uIU/mL Final  . Free T4 01/20/2014 0.91  0.60 - 1.60 ng/dL Final  . Hemoglobin A1C 01/20/2014 7.4* 4.6 - 6.5 % Final   Glycemic Control Guidelines for People with Diabetes:Non Diabetic:  <6%Goal of Therapy: <7%Additional Action Suggested:  >8%    Hba1c much improved from 11.4%!  TFTs normal. See plan above for changing the Synthroid dose.

## 2014-01-21 ENCOUNTER — Ambulatory Visit (HOSPITAL_COMMUNITY)
Admission: RE | Admit: 2014-01-21 | Discharge: 2014-01-21 | Disposition: A | Discharge status: Home / Self Care | Payer: 59 | Source: Ambulatory Visit | Attending: Family Medicine | Admitting: Family Medicine

## 2014-01-21 ENCOUNTER — Encounter (HOSPITAL_COMMUNITY): Payer: Self-pay

## 2014-01-21 ENCOUNTER — Other Ambulatory Visit (HOSPITAL_COMMUNITY): Payer: Self-pay | Admitting: Family Medicine

## 2014-01-21 DIAGNOSIS — R109 Unspecified abdominal pain: Secondary | ICD-10-CM

## 2014-01-21 DIAGNOSIS — N289 Disorder of kidney and ureter, unspecified: Secondary | ICD-10-CM | POA: Insufficient documentation

## 2014-01-21 DIAGNOSIS — Z9089 Acquired absence of other organs: Secondary | ICD-10-CM | POA: Insufficient documentation

## 2014-01-21 DIAGNOSIS — K7689 Other specified diseases of liver: Secondary | ICD-10-CM | POA: Insufficient documentation

## 2014-01-21 DIAGNOSIS — R161 Splenomegaly, not elsewhere classified: Secondary | ICD-10-CM | POA: Insufficient documentation

## 2014-01-21 DIAGNOSIS — M51379 Other intervertebral disc degeneration, lumbosacral region without mention of lumbar back pain or lower extremity pain: Secondary | ICD-10-CM | POA: Insufficient documentation

## 2014-01-21 DIAGNOSIS — R112 Nausea with vomiting, unspecified: Secondary | ICD-10-CM | POA: Insufficient documentation

## 2014-01-21 DIAGNOSIS — M5137 Other intervertebral disc degeneration, lumbosacral region: Secondary | ICD-10-CM | POA: Insufficient documentation

## 2014-01-21 DIAGNOSIS — R319 Hematuria, unspecified: Secondary | ICD-10-CM | POA: Insufficient documentation

## 2014-02-17 ENCOUNTER — Other Ambulatory Visit: Payer: Self-pay | Admitting: Internal Medicine

## 2014-02-17 ENCOUNTER — Ambulatory Visit
Admission: RE | Admit: 2014-02-17 | Discharge: 2014-02-17 | Disposition: A | Discharge status: Home / Self Care | Payer: 59 | Source: Ambulatory Visit

## 2014-02-17 DIAGNOSIS — Z1231 Encounter for screening mammogram for malignant neoplasm of breast: Secondary | ICD-10-CM

## 2014-02-17 DIAGNOSIS — Z803 Family history of malignant neoplasm of breast: Secondary | ICD-10-CM

## 2014-02-17 DIAGNOSIS — E890 Postprocedural hypothyroidism: Secondary | ICD-10-CM

## 2014-02-17 MED ORDER — LEVOTHYROXINE SODIUM 200 MCG PO TABS: 200.0000 ug | ORAL_TABLET | Freq: Every day | ORAL | Status: DC

## 2014-02-18 ENCOUNTER — Ambulatory Visit: Payer: Self-pay | Admitting: Internal Medicine

## 2014-02-21 ENCOUNTER — Other Ambulatory Visit: Payer: Self-pay | Admitting: *Deleted

## 2014-02-21 ENCOUNTER — Ambulatory Visit: Payer: Self-pay | Admitting: Internal Medicine

## 2014-02-21 MED ORDER — LEVOTHYROXINE SODIUM 200 MCG PO TABS: 200.0000 ug | ORAL_TABLET | Freq: Every day | ORAL | Status: DC

## 2014-02-25 ENCOUNTER — Other Ambulatory Visit: Payer: Self-pay | Admitting: *Deleted

## 2014-02-25 ENCOUNTER — Ambulatory Visit (INDEPENDENT_AMBULATORY_CARE_PROVIDER_SITE_OTHER): Payer: 59 | Admitting: Internal Medicine

## 2014-02-25 ENCOUNTER — Encounter: Payer: Self-pay | Admitting: Internal Medicine

## 2014-02-25 VITALS — BP 162/104 | HR 76 | Temp 98.5°F | Resp 12 | Wt 338.0 lb

## 2014-02-25 DIAGNOSIS — E890 Postprocedural hypothyroidism: Secondary | ICD-10-CM

## 2014-02-25 DIAGNOSIS — E119 Type 2 diabetes mellitus without complications: Secondary | ICD-10-CM

## 2014-02-25 LAB — T4, FREE: Free T4: 0.29 ng/dL — ABNORMAL LOW (ref 0.60–1.60)

## 2014-02-25 LAB — TSH: TSH: 17.07 u[IU]/mL — ABNORMAL HIGH (ref 0.35–4.50)

## 2014-02-25 NOTE — Patient Instructions (Signed)
HANG IN THERE!  Please stop at the lab.  Please continue: - Metformin XR 1000 2x a day - Lantus 16 units at bedtime.  - Januvia 100 mg daily in am. Add Glipizide 5 mg 2x a day. Please let me know if you need a refill.  Please return in 1 month with your sugar log.

## 2014-02-25 NOTE — Progress Notes (Signed)
Patient ID: Tracy [name], female   DOB: 12/23/1965, 48 y.o.   MRN: [id number]  HPI: Tracy [name] is a 48 y.o.-year-old female, returning for f/u for DM2, dx in 06/01/2013, uncontrolled, without complications. She also wants me to start managing her hypothyroidism. Last visit 1 mo ago.  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 She is crying in the office telling me about all these.  DM2: Last hemoglobin A1c was Lab Results  Component Value Date   HGBA1C 7.4* 01/20/2014   HGBA1C 5.7 07/04/2011   HGBA1C 5.4 02/08/2010   (06/04/2013) HbA1c 11.4%.  Pt is on a regimen of: - Metformin XR 1000 bid. She did not try the immediate release. - Lantus 16 units at bedtime. - Januvia 100 mg daily in am. We stopped Glipizide and added Januvia.  Pt was checking  her sugars 2x a day, not lately. She feels her sugars are higher as she has nocturia. She checked last night 349 >> 214 >> 192, since last time. Previous sugars:  - am: (65) 80-114 - bedtime: 140-160 No lows. Lowest sugar was 175 >> now 65; ? Level of hypoglycemia awareness Uses a One Touch Verio IQ meter.  Pt's meals are: - Breakfast: scrambled eggs and cheese, piece of toast, grapes, coffee, V8, water - Lunch: salad with chicken or [place] sandwich, cheese cubes, water - Dinner: [place] meatloaf, or small piece of pizza, salad and water - Snacks: 1-2 a day She went to the diabetes class, Link to [employer].  - no CKD, last BUN/creatinine:  Lab Results  Component Value Date   BUN 11 10/11/2013   CREATININE 0.70 10/11/2013  - last eye exam was in Spring 2014. No DR. She has blurry vision on and off. - no numbness and tingling in her feet.  Hypothyroidism: - she is on Levothyroxine 200 mcg in am  - takes Zantac 2x a day - separated from Levothyroxine  ROS: Constitutional: +  weight gain, + fatigue, + subjective hyperthermia, + hot flashes, + nocturia Eyes:no blurry vision, no xerophthalmia ENT: no sore throat, no nodules palpated in throat, no dysphagia/odynophagia, no hoarseness Cardiovascular: no CP/SOB/palpitations/+ leg swelling Respiratory: no cough/SOB Gastrointestinal: no N/no V/+D/no C/heartburn Musculoskeletal: + muscle aches/no joint aches Skin: no rashes Neurological: no tremors/numbness/tingling/dizziness, + HA  Patient mentioned papillary thyroid cancer in her PMH, however, per review of the thyroid pathology after thyroidectomy, this was benign  I reviewed pt's medications, allergies, PMH, social hx, family hx and no changes required, except as mentioned above.  PE: BP 162/104  Pulse 76  Temp(Src) 98.5 F (36.9 C) (Oral)  Resp 12  Wt 338 lb (153.316 kg) Wt Readings from Last 3 Encounters:  02/25/14 338 lb (153.316 kg)  01/20/14 335 lb (151.955 kg)  01/18/14 310 lb (140.615 kg)   Constitutional: obese, crying  Eyes: PERRLA, EOMI, no exophthalmos ENT: moist mucous membranes, no thyromegaly, no cervical lymphadenopathy Cardiovascular: RRR, No MRG Respiratory: CTA B Gastrointestinal: abdomen soft, NT, ND, BS+ Musculoskeletal: no deformities, strength intact in all 4 Skin: moist, warm, no rashes Neurological: no tremor with outstretched hands, DTR normal in all 4  ASSESSMENT: 1. DM2, uncontrolled, without complications  2. Hypothyroidism - post-surgical  PLAN:  1. Patient with uncontrolled DM2, on oral antidiabetic regimen, with much increased sugars due to stress lately.  - We discussed  about options for treatment, and I suggested to:  Patient Instructions  Please continue: - Metformin XR 1000 2x a day - Lantus 16 units at bedtime.  - Januvia 100 mg daily in am. Add Glipizide 5 mg 2x a day. Please let me know if you need a refill.  Please return in 1 month with your sugar log.   - restart checking sugars at different times  of the day - check 3 times a day, rotating checks - advised for yearly eye exams >> she is due - Return to clinic in 1 mo with sugar log   2. Hypothyroidism - post-surgical - we separated the Levothyroxine from the Ranitidine and decrease the dose to 200 mcg daily >> next TFTs were normal  - will recheck TSH and fT4 today  Office Visit on 02/25/2014  Component Date Value Ref Range Status  . TSH 02/25/2014 17.07* 0.35 - 4.50 uIU/mL Final  . Free T4 02/25/2014 0.29* 0.60 - 1.60 ng/dL Final  Labs are abnormal >> Pt likely was not completely compliant with LT4 >> will advise to restart as advised and will recheck in 1 mo when she comes back.

## 2014-02-28 ENCOUNTER — Emergency Department (HOSPITAL_COMMUNITY)
Admission: EM | Admit: 2014-02-28 | Discharge: 2014-02-28 | Disposition: A | Discharge status: Home / Self Care | Payer: 59 | Attending: Emergency Medicine | Admitting: Emergency Medicine

## 2014-02-28 ENCOUNTER — Emergency Department (HOSPITAL_COMMUNITY): Payer: 59

## 2014-02-28 ENCOUNTER — Encounter (HOSPITAL_COMMUNITY): Payer: Self-pay | Admitting: Emergency Medicine

## 2014-02-28 DIAGNOSIS — Z3202 Encounter for pregnancy test, result negative: Secondary | ICD-10-CM | POA: Insufficient documentation

## 2014-02-28 DIAGNOSIS — R748 Abnormal levels of other serum enzymes: Secondary | ICD-10-CM | POA: Insufficient documentation

## 2014-02-28 DIAGNOSIS — F3289 Other specified depressive episodes: Secondary | ICD-10-CM | POA: Insufficient documentation

## 2014-02-28 DIAGNOSIS — E039 Hypothyroidism, unspecified: Secondary | ICD-10-CM | POA: Insufficient documentation

## 2014-02-28 DIAGNOSIS — K219 Gastro-esophageal reflux disease without esophagitis: Secondary | ICD-10-CM | POA: Insufficient documentation

## 2014-02-28 DIAGNOSIS — E8881 Metabolic syndrome: Secondary | ICD-10-CM | POA: Insufficient documentation

## 2014-02-28 DIAGNOSIS — Z98811 Dental restoration status: Secondary | ICD-10-CM | POA: Insufficient documentation

## 2014-02-28 DIAGNOSIS — Z87891 Personal history of nicotine dependence: Secondary | ICD-10-CM | POA: Insufficient documentation

## 2014-02-28 DIAGNOSIS — Z794 Long term (current) use of insulin: Secondary | ICD-10-CM | POA: Insufficient documentation

## 2014-02-28 DIAGNOSIS — Z87442 Personal history of urinary calculi: Secondary | ICD-10-CM | POA: Insufficient documentation

## 2014-02-28 DIAGNOSIS — E119 Type 2 diabetes mellitus without complications: Secondary | ICD-10-CM | POA: Insufficient documentation

## 2014-02-28 DIAGNOSIS — F329 Major depressive disorder, single episode, unspecified: Secondary | ICD-10-CM | POA: Insufficient documentation

## 2014-02-28 DIAGNOSIS — Z9889 Other specified postprocedural states: Secondary | ICD-10-CM | POA: Insufficient documentation

## 2014-02-28 DIAGNOSIS — M129 Arthropathy, unspecified: Secondary | ICD-10-CM | POA: Insufficient documentation

## 2014-02-28 DIAGNOSIS — Z7982 Long term (current) use of aspirin: Secondary | ICD-10-CM | POA: Insufficient documentation

## 2014-02-28 DIAGNOSIS — I10 Essential (primary) hypertension: Secondary | ICD-10-CM | POA: Insufficient documentation

## 2014-02-28 DIAGNOSIS — E282 Polycystic ovarian syndrome: Secondary | ICD-10-CM | POA: Insufficient documentation

## 2014-02-28 DIAGNOSIS — Z9089 Acquired absence of other organs: Secondary | ICD-10-CM | POA: Insufficient documentation

## 2014-02-28 DIAGNOSIS — F411 Generalized anxiety disorder: Secondary | ICD-10-CM | POA: Insufficient documentation

## 2014-02-28 DIAGNOSIS — R109 Unspecified abdominal pain: Secondary | ICD-10-CM | POA: Insufficient documentation

## 2014-02-28 LAB — COMPREHENSIVE METABOLIC PANEL
ALT: 95 U/L — ABNORMAL HIGH (ref 0–35)
AST: 113 U/L — ABNORMAL HIGH (ref 0–37)
Albumin: 4 g/dL (ref 3.5–5.2)
Alkaline Phosphatase: 97 U/L (ref 39–117)
Anion gap: 16 — ABNORMAL HIGH (ref 5–15)
BUN: 13 mg/dL (ref 6–23)
CO2: 19 mEq/L (ref 19–32)
Calcium: 9.5 mg/dL (ref 8.4–10.5)
Chloride: 101 mEq/L (ref 96–112)
Creatinine, Ser: 0.89 mg/dL (ref 0.50–1.10)
GFR calc Af Amer: 89 mL/min — ABNORMAL LOW (ref >90)
GFR calc non Af Amer: 77 mL/min — ABNORMAL LOW (ref >90)
Glucose, Bld: 120 mg/dL — ABNORMAL HIGH (ref 70–99)
Potassium: 3.7 mEq/L (ref 3.7–5.3)
Sodium: 136 mEq/L — ABNORMAL LOW (ref 137–147)
Total Bilirubin: 0.5 mg/dL (ref 0.3–1.2)
Total Protein: 7.9 g/dL (ref 6.0–8.3)

## 2014-02-28 LAB — I-STAT CHEM 8, ED
BUN: 12 mg/dL (ref 6–23)
Calcium, Ion: 1.19 mmol/L (ref 1.12–1.23)
Chloride: 103 mEq/L (ref 96–112)
Creatinine, Ser: 0.9 mg/dL (ref 0.50–1.10)
GLUCOSE: 121 mg/dL — AB (ref 70–99)
HEMATOCRIT: 50 % — AB (ref 36.0–46.0)
Hemoglobin: 17 g/dL — ABNORMAL HIGH (ref 12.0–15.0)
POTASSIUM: 3.6 meq/L — AB (ref 3.7–5.3)
Sodium: 137 mEq/L (ref 137–147)
TCO2: 21 mmol/L (ref 0–100)

## 2014-02-28 LAB — CBC WITH DIFFERENTIAL/PLATELET
Basophils Absolute: 0 10*3/uL (ref 0.0–0.1)
Basophils Relative: 0 % (ref 0–1)
Eosinophils Absolute: 0.1 10*3/uL (ref 0.0–0.7)
Eosinophils Relative: 2 % (ref 0–5)
HCT: 45.6 % (ref 36.0–46.0)
HEMOGLOBIN: 15.6 g/dL — AB (ref 12.0–15.0)
LYMPHS ABS: 2.2 10*3/uL (ref 0.7–4.0)
Lymphocytes Relative: 25 % (ref 12–46)
MCH: 31 pg (ref 26.0–34.0)
MCHC: 34.2 g/dL (ref 30.0–36.0)
MCV: 90.7 fL (ref 78.0–100.0)
MONOS PCT: 6 % (ref 3–12)
Monocytes Absolute: 0.5 10*3/uL (ref 0.1–1.0)
NEUTROS PCT: 67 % (ref 43–77)
Neutro Abs: 6 10*3/uL (ref 1.7–7.7)
Platelets: 198 10*3/uL (ref 150–400)
RBC: 5.03 MIL/uL (ref 3.87–5.11)
RDW: 15.5 % (ref 11.5–15.5)
WBC: 8.9 10*3/uL (ref 4.0–10.5)

## 2014-02-28 LAB — URINALYSIS, ROUTINE W REFLEX MICROSCOPIC
Bilirubin Urine: NEGATIVE
Glucose, UA: NEGATIVE mg/dL
Hgb urine dipstick: NEGATIVE
Ketones, ur: NEGATIVE mg/dL
Leukocytes, UA: NEGATIVE
Nitrite: NEGATIVE
Protein, ur: NEGATIVE mg/dL
Specific Gravity, Urine: 1.019 (ref 1.005–1.030)
Urobilinogen, UA: 0.2 mg/dL (ref 0.0–1.0)
pH: 5.5 (ref 5.0–8.0)

## 2014-02-28 LAB — CBG MONITORING, ED: Glucose-Capillary: 82 mg/dL (ref 70–99)

## 2014-02-28 LAB — POC URINE PREG, ED: Preg Test, Ur: NEGATIVE

## 2014-02-28 MED ORDER — SODIUM CHLORIDE 0.9 % IV BOLUS (SEPSIS)
1000.0000 mL | Freq: Once | INTRAVENOUS | Status: AC
  Administered 2014-02-28: 1000 mL via INTRAVENOUS

## 2014-02-28 MED ORDER — ONDANSETRON HCL 4 MG/2ML IJ SOLN
4.0000 mg | Freq: Once | INTRAMUSCULAR | Status: AC
  Administered 2014-02-28: 4 mg via INTRAVENOUS
  Filled 2014-02-28: qty 2

## 2014-02-28 MED ORDER — HYDROCODONE-ACETAMINOPHEN 5-325 MG PO TABS: 1.0000 | ORAL_TABLET | Freq: Four times a day (QID) | ORAL | Status: DC | PRN

## 2014-02-28 MED ORDER — HYDROMORPHONE HCL PF 1 MG/ML IJ SOLN
1.0000 mg | Freq: Once | INTRAMUSCULAR | Status: AC
  Administered 2014-02-28: 1 mg via INTRAVENOUS
  Filled 2014-02-28: qty 1

## 2014-02-28 NOTE — ED Notes (Signed)
Pt placed in room and on monitor. Janett Billow RN in room

## 2014-02-28 NOTE — ED Provider Notes (Signed)
CSN: [id number]     Arrival date & time 02/28/14  1124 History   First MD Initiated Contact with Patient 02/28/14 1136     Chief Complaint  Patient presents with  . Abdominal Pain     (Consider location/radiation/quality/duration/timing/severity/associated sxs/prior Treatment) HPI Patient presents to the emergency department with abdominal pain, that started an hour ago.  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  She states she was eating crackers and started noticing, abdominal pain in the right mid abdomen.  Patient, states, that had an appendectomy.  The patient denies chest pain, shortness of breath, weakness, dizziness, nausea, vomiting, diarrhea, headache, blurred vision, back pain, neck pain, dysuria, hematuria, rash, fever, swollen lymph nodes, bloody stool, hematemesis, or syncope.  The patient, states she did not take any medication prior to arrival.  Patient, states nothing seems to make her condition, better, other than pressing on her abdomen.  Nothing seems to make her condition worse Past Medical History  Diagnosis Date  . PCOS (polycystic ovarian syndrome)   . History of migraine headaches   . Hypothyroidism   . GERD (gastroesophageal reflux disease)   . History of kidney stones   . Arthritis     back, knees, right elbow  . Anxiety   . High cholesterol     no current med.  . Hypertension     under control with meds., has been on med. x 4 yr.  . Depression   . Achilles tendonitis 02/2013    right  . Acquired Haglund's deformity of right heel 02/2013  . 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 PONV (postoperative nausea and vomiting)     also hx. of emergence delirium 2007  . Insulin resistance     does not check blood sugar at home  . History of electroconvulsive therapy   . Dental crowns present   . Diabetes mellitus without complication    Past Surgical History  Procedure Laterality Date  . Appendectomy    . Pilonidal cyst excision  1994  . Total thyroidectomy   2003  . Ovarian cyst surgery Left   . Lumbar laminectomy      X 3  . Salpingectomy Right 1984  . Achilles tendon surgery Left 2007  . Excision haglund's deformity with achilles tendon repair Right 02/25/2013    Procedure: RIGHT ACHILLES DEBRIDEMENT AND RECONSTRUCTION;  HAGLUND'S EXCISION; GASTROC RECESSION AND FLEXOR HALLUCIS LONGUS TRANSFER;  Surgeon: Wylene Simmer, MD;  Location: [place];  Service: Orthopedics;  Laterality: Right;   Family History  Problem Relation Age of Onset  . Breast cancer Mother   . Hypertension Mother   . Heart disease Father   . Cancer Brother     THROAT   History  Substance Use Topics  . Smoking status: Former Smoker -- 11 years    Types: Cigarettes  . Smokeless tobacco: Never Used     Comment: 2-3 cig./day  . Alcohol Use: No   OB History   Grav Para Term Preterm Abortions TAB SAB Ect Mult Living   0              Review of Systems All other systems negative except as documented in the HPI. All pertinent positives and negatives as reviewed in the HPI.    Allergies  Sulfa antibiotics; Bupropion hcl; and Codeine  Home Medications   Prior to Admission medications   Medication Sig Start Date End Date Taking? Authorizing Provider  ARIPiprazole (ABILIFY) 5 MG tablet  Take 5 mg by mouth daily.    Yes Historical Provider, MD  aspirin 81 MG tablet Take 81 mg by mouth daily.   Yes Historical Provider, MD  DULoxetine (CYMBALTA) 60 MG capsule Take 60 mg by mouth daily.   Yes Historical Provider, MD  glipiZIDE (GLUCOTROL) 10 MG tablet Take 10 mg by mouth 2 (two) times daily.   Yes Historical Provider, MD  insulin glargine (LANTUS) 100 UNIT/ML injection Inject 16 Units into the skin at bedtime.   Yes Historical Provider, MD  L-Methylfolate 15 MG TABS Take 15 mg by mouth daily.   Yes Historical Provider, MD  levothyroxine (SYNTHROID) 200 MCG tablet Take 1 tablet (200 mcg total) by mouth daily before breakfast. 02/21/14  Yes Philemon Kingdom, MD   lisinopril-hydrochlorothiazide (PRINZIDE,ZESTORETIC) 10-12.5 MG per tablet Take 1 tablet by mouth daily.   Yes Historical Provider, MD  metFORMIN (GLUCOPHAGE-XR) 500 MG 24 hr tablet Take 1,000 mg by mouth 2 (two) times daily.   Yes Historical Provider, MD  ondansetron (ZOFRAN-ODT) 4 MG disintegrating tablet Take 4 mg by mouth every 8 (eight) hours as needed for nausea or vomiting.   Yes Historical Provider, MD  ranitidine (ZANTAC) 150 MG tablet Take 150 mg by mouth daily.   Yes Historical Provider, MD  sitaGLIPtin (JANUVIA) 100 MG tablet Take 100 mg by mouth daily.   Yes Historical Provider, MD  glucose blood (ONETOUCH VERIO) test strip Use to test blood glucose 3 times a day as instructed. 12/08/13   Philemon Kingdom, MD  glucose blood test strip Test blood glucose 3 times a day - OneTouch Verio 06/15/13   Philemon Kingdom, MD  glucose monitoring kit (FREESTYLE) monitoring kit 1 each by Does not apply route as needed for other. 06/01/13   Katheren Shams, MD  Insulin Pen Needle 32G X 4 MM MISC Inject once a day 06/10/13   Philemon Kingdom, MD  ondansetron (ZOFRAN-ODT) 4 MG disintegrating tablet 69m ODT q4 hours prn nausea/vomit 01/18/14 02/28/14  Robyn M Albert, PA-C   BP 100/44  Pulse 84  Temp(Src) 98.6 F (37 C) (Oral)  Resp 19  SpO2 94% Physical Exam  Nursing note and vitals reviewed. Constitutional: She is oriented to person, place, and time. She appears well-developed and well-nourished. No distress.  HENT:  Head: Normocephalic and atraumatic.  Mouth/Throat: Oropharynx is clear and moist.  Eyes: Pupils are equal, round, and reactive to light.  Neck: Normal range of motion. Neck supple.  Cardiovascular: Normal rate, regular rhythm and normal heart sounds.  Exam reveals no gallop and no friction rub.   No murmur heard. Pulmonary/Chest: Effort normal and breath sounds normal.  Abdominal: Soft. Normal appearance and bowel sounds are normal. She exhibits no distension. There is no  hepatosplenomegaly. There is tenderness. There is no rebound, no guarding and no CVA tenderness. No hernia.    Musculoskeletal: She exhibits no edema.  Neurological: She is alert and oriented to person, place, and time. She exhibits normal muscle tone. Coordination normal.  Skin: Skin is warm and dry. No rash noted. No erythema.  Psychiatric: She has a normal mood and affect.    ED Course  Procedures (including critical care time) Labs Review Labs Reviewed  CBC WITH DIFFERENTIAL - Abnormal; Notable for the following:    Hemoglobin 15.6 (*)    All other components within normal limits  COMPREHENSIVE METABOLIC PANEL - Abnormal; Notable for the following:    Sodium 136 (*)    Glucose, Bld 120 (*)  AST 113 (*)    ALT 95 (*)    GFR calc non Af Amer 77 (*)    GFR calc Af Amer 89 (*)    Anion gap 16 (*)    All other components within normal limits  I-STAT CHEM 8, ED - Abnormal; Notable for the following:    Potassium 3.6 (*)    Glucose, Bld 121 (*)    Hemoglobin 17.0 (*)    HCT 50.0 (*)    All other components within normal limits  URINALYSIS, ROUTINE W REFLEX MICROSCOPIC  POC URINE PREG, ED  CBG MONITORING, ED    Imaging Review US Abdomen Complete  02/28/2014   CLINICAL DATA:  Generalized abdominal pain. Current history of hypertension and diabetes.  EXAM: ULTRASOUND ABDOMEN COMPLETE  COMPARISON:  Unenhanced CT abdomen and pelvis 01/21/2014.  FINDINGS: Gallbladder:  No shadowing gallstones or echogenic sludge. No gallbladder wall thickening or pericholecystic fluid. Negative sonographic Murphy sign according to the ultrasound technologist.  Common bile duct:  Diameter: Approximately 6 mm.  Liver:  Diffusely increased and coarsened echotexture without focal hepatic parenchymal abnormality. Patent portal vein with hepatopetal flow.  IVC:  Patent.  Pancreas:  While difficult to visualize in its entirety, visualized portions normal in appearance.  Spleen:  Upper normal in size measuring  approximately 11.4 x 4.7 x 13.1 cm yielding a volume approximately 367 ml, not significantly changed since the CT 6 weeks ago. No focal splenic parenchymal abnormality.  Right Kidney:  Length: Approximately 13.7 cm. No hydronephrosis. Well-preserved cortex. No shadowing calculi. Normal parenchymal echotexture without focal abnormalities.  Left Kidney:  Length: Approximately 14.8 cm. No hydronephrosis. Well-preserved cortex. No shadowing calculi. Normal parenchymal echotexture without focal abnormalities.  Abdominal aorta:  Normal in caliber throughout its visualized course in the abdomen without evidence of significant atherosclerosis. Maximum diameter 2.6 cm.  Other findings:  None.  IMPRESSION: 1. Diffuse hepatic steatosis and/or hepatocellular disease. No focal hepatic parenchymal abnormality. 2. Otherwise normal examination.   Electronically Signed   By: Evangeline Dakin M.D.   On: 02/28/2014 14:57   Patient's pain is improved while here in the emergency department.  Her testing has not yet any significant abnormalities.  She does have some fatty liver/liver disease with mild elevation of liver enzymes.  No gallstones.  Patient is advised this could be an evolving process, and she'll need to return here for any worsening in her condition.  At this point its undifferentiated abdominal pain after eating    Brent General, PA-C 03/06/14 [phone]

## 2014-02-28 NOTE — ED Notes (Signed)
Pt alert x4 respirations easy non labored.  

## 2014-02-28 NOTE — ED Notes (Signed)
PT'S PAIN ON THE RIGHT SIDE.  Pt. Had grAVY BISCUIT AND dR. pEPPPER

## 2014-02-28 NOTE — Discharge Instructions (Signed)
Return here as needed.  Followup with your primary care Dr. for recheck °

## 2014-02-28 NOTE — ED Notes (Signed)
Pt. Came from peds  WORKING  20 MINUTES AGO HAD SEVER ABDOMINAL PAIN

## 2014-03-14 NOTE — ED Provider Notes (Signed)
Medical screening examination/treatment/procedure(s) were conducted as a shared visit with non-physician practitioner(s) and myself.  I personally evaluated the patient during the encounter.   EKG Interpretation None        Orpah Greek, MD 03/14/14 [phone]

## 2014-03-24 ENCOUNTER — Encounter: Payer: Self-pay | Admitting: Internal Medicine

## 2014-03-30 ENCOUNTER — Encounter: Payer: Self-pay | Admitting: *Deleted

## 2014-03-31 ENCOUNTER — Encounter (HOSPITAL_COMMUNITY): Payer: Self-pay | Admitting: Pharmacy Technician

## 2014-04-05 ENCOUNTER — Ambulatory Visit: Payer: Self-pay | Admitting: Internal Medicine

## 2014-04-07 ENCOUNTER — Encounter: Payer: Self-pay | Admitting: Family Medicine

## 2014-04-07 NOTE — Progress Notes (Signed)
Patient ID: Tracy [name], female   DOB: [birth year], 48 y.o.   MRN: [id number] Reviewed: Agree with the documentation and management of our [place].

## 2014-04-11 ENCOUNTER — Encounter: Payer: Self-pay | Admitting: Internal Medicine

## 2014-04-12 ENCOUNTER — Encounter (HOSPITAL_COMMUNITY): Payer: Self-pay | Admitting: *Deleted

## 2014-04-12 ENCOUNTER — Encounter (HOSPITAL_COMMUNITY): Payer: Self-pay | Admitting: Pharmacy Technician

## 2014-04-15 ENCOUNTER — Other Ambulatory Visit: Payer: Self-pay | Admitting: Internal Medicine

## 2014-04-15 ENCOUNTER — Other Ambulatory Visit: Payer: Self-pay | Admitting: Gastroenterology

## 2014-04-18 NOTE — Addendum Note (Signed)
Addended by: Arta Silence on: 04/18/2014 05:58 AM   Modules accepted: Orders

## 2014-04-19 ENCOUNTER — Encounter (HOSPITAL_COMMUNITY): Payer: Self-pay | Admitting: Anesthesiology

## 2014-04-19 ENCOUNTER — Ambulatory Visit: Payer: Self-pay | Admitting: Internal Medicine

## 2014-04-19 NOTE — Anesthesia Preprocedure Evaluation (Signed)
Anesthesia Evaluation  Patient identified by MRN, date of birth, ID band Patient awake    Reviewed: Allergy & Precautions, H&P , NPO status , Patient's Chart, lab work & pertinent test results  History of Anesthesia Complications (+) PONV and history of anesthetic complications  Airway Mallampati: II TM Distance: >3 FB Neck ROM: Full    Dental no notable dental hx.    Pulmonary neg pulmonary ROS, former smoker,  breath sounds clear to auscultation  Pulmonary exam normal       Cardiovascular hypertension, Pt. on medications Rhythm:Regular Rate:Normal     Neuro/Psych  Headaches, PSYCHIATRIC DISORDERS Anxiety Depression    GI/Hepatic Neg liver ROS, GERD-  ,  Endo/Other  diabetes, Type 2, Oral Hypoglycemic Agents, Insulin DependentHypothyroidism Morbid obesity  Renal/GU negative Renal ROS  negative genitourinary   Musculoskeletal  (+) Arthritis -,   Abdominal (+) + obese,   Peds negative pediatric ROS (+)  Hematology negative hematology ROS (+)   Anesthesia Other Findings   Reproductive/Obstetrics negative OB ROS                           Anesthesia Physical Anesthesia Plan  ASA: III  Anesthesia Plan: MAC   Post-op Pain Management:    Induction: Intravenous  Airway Management Planned:   Additional Equipment:   Intra-op Plan:   Post-operative Plan:   Informed Consent: I have reviewed the patients History and Physical, chart, labs and discussed the procedure including the risks, benefits and alternatives for the proposed anesthesia with the patient or authorized representative who has indicated his/her understanding and acceptance.   Dental advisory given  Plan Discussed with: CRNA  Anesthesia Plan Comments:         Anesthesia Quick Evaluation

## 2014-04-20 ENCOUNTER — Ambulatory Visit (HOSPITAL_COMMUNITY): Payer: 59 | Admitting: Anesthesiology

## 2014-04-20 ENCOUNTER — Encounter (HOSPITAL_COMMUNITY)
Admission: RE | Disposition: A | Discharge status: Home / Self Care | Payer: Self-pay | Source: Ambulatory Visit | Attending: Gastroenterology

## 2014-04-20 ENCOUNTER — Encounter (HOSPITAL_COMMUNITY): Payer: Self-pay

## 2014-04-20 ENCOUNTER — Encounter (HOSPITAL_COMMUNITY): Payer: 59 | Admitting: Anesthesiology

## 2014-04-20 ENCOUNTER — Ambulatory Visit (HOSPITAL_COMMUNITY)
Admission: RE | Admit: 2014-04-20 | Discharge: 2014-04-20 | Disposition: A | Discharge status: Home / Self Care | Payer: 59 | Source: Ambulatory Visit | Attending: Gastroenterology | Admitting: Gastroenterology

## 2014-04-20 DIAGNOSIS — R109 Unspecified abdominal pain: Secondary | ICD-10-CM | POA: Diagnosis present

## 2014-04-20 DIAGNOSIS — K644 Residual hemorrhoidal skin tags: Secondary | ICD-10-CM | POA: Insufficient documentation

## 2014-04-20 DIAGNOSIS — D126 Benign neoplasm of colon, unspecified: Secondary | ICD-10-CM | POA: Insufficient documentation

## 2014-04-20 DIAGNOSIS — R198 Other specified symptoms and signs involving the digestive system and abdomen: Secondary | ICD-10-CM | POA: Insufficient documentation

## 2014-04-20 HISTORY — PX: COLONOSCOPY WITH PROPOFOL: SHX5780

## 2014-04-20 HISTORY — PX: ESOPHAGOGASTRODUODENOSCOPY (EGD) WITH PROPOFOL: SHX5813

## 2014-04-20 LAB — GLUCOSE, CAPILLARY: Glucose-Capillary: 112 mg/dL — ABNORMAL HIGH (ref 70–99)

## 2014-04-20 SURGERY — ESOPHAGOGASTRODUODENOSCOPY (EGD) WITH PROPOFOL
Anesthesia: Monitor Anesthesia Care

## 2014-04-20 MED ORDER — PROPOFOL 10 MG/ML IV BOLUS
INTRAVENOUS | Status: AC
  Filled 2014-04-20: qty 20

## 2014-04-20 MED ORDER — LACTATED RINGERS IV SOLN
INTRAVENOUS | Status: DC
  Administered 2014-04-20: 08:00:00 via INTRAVENOUS

## 2014-04-20 MED ORDER — LACTATED RINGERS IV SOLN
INTRAVENOUS | Status: DC | PRN
  Administered 2014-04-20: 08:00:00 via INTRAVENOUS

## 2014-04-20 MED ORDER — BUTAMBEN-TETRACAINE-BENZOCAINE 2-2-14 % EX AERO
INHALATION_SPRAY | CUTANEOUS | Status: DC | PRN
  Administered 2014-04-20: 2 via TOPICAL

## 2014-04-20 MED ORDER — PROPOFOL 10 MG/ML IV BOLUS
INTRAVENOUS | Status: DC | PRN
  Administered 2014-04-20 (×5): 50 mg via INTRAVENOUS
  Administered 2014-04-20: 100 mg via INTRAVENOUS
  Administered 2014-04-20: 25 mg via INTRAVENOUS
  Administered 2014-04-20: 100 mg via INTRAVENOUS
  Administered 2014-04-20: 50 mg via INTRAVENOUS
  Administered 2014-04-20 (×2): 100 mg via INTRAVENOUS

## 2014-04-20 SURGICAL SUPPLY — 25 items

## 2014-04-20 NOTE — Discharge Instructions (Addendum)
Gastrointestinal Endoscopy, Care After Refer to this sheet in the next few weeks. These instructions provide you with information on caring for yourself after your procedure. Your caregiver may also give you more specific instructions. Your treatment has been planned according to current medical practices, but problems sometimes occur. Call your caregiver if you have any problems or questions after your procedure. HOME CARE INSTRUCTIONS  If you were given medicine to help you relax (sedative), do not drive, operate machinery, or sign important documents for 24 hours.  Avoid alcohol and hot or warm beverages for the first 24 hours after the procedure.  Only take over-the-counter or prescription medicines for pain, discomfort, or fever as directed by your caregiver. You may resume taking your normal medicines unless your caregiver tells you otherwise. Ask your caregiver when you may resume taking medicines that may cause bleeding, such as aspirin, clopidogrel, or warfarin.  You may return to your normal diet and activities on the day after your procedure, or as directed by your caregiver. Walking may help to reduce any bloated feeling in your abdomen.  Drink enough fluids to keep your urine clear or pale yellow.  You may gargle with salt water if you have a sore throat. SEEK IMMEDIATE MEDICAL CARE IF:  You have severe nausea or vomiting.  You have severe abdominal pain, abdominal cramps that last longer than 6 hours, or abdominal swelling (distention).  You have severe shoulder or back pain.  You have trouble swallowing.  You have shortness of breath, your breathing is shallow, or you are breathing faster than normal.  You have a fever or a rapid heartbeat.  You vomit blood or material that looks like coffee grounds.  You have bloody, black, or tarry stools. MAKE SURE YOU:  Understand these instructions.  Will watch your condition.  Will get help right away if you are not doing  well or get worse. Document Released: 03/05/2004 Document Revised: 12/06/2013 Document Reviewed: 10/22/2011 [facility] Patient Information 2015 [place], [place]. This information is not intended to replace advice given to you by your health care provider. Make sure you discuss any questions you have with your health care provider.  Colonoscopy, Care After These instructions give you information on caring for yourself after your procedure. Your doctor may also give you more specific instructions. Call your doctor if you have any problems or questions after your procedure. HOME CARE  Do not drive for 24 hours.  Do not sign important papers or use machinery for 24 hours.  You may shower.  You may go back to your usual activities, but go slower for the first 24 hours.  Take rest breaks often during the first 24 hours.  Walk around or use warm packs on your belly (abdomen) if you have belly cramping or gas.  Drink enough fluids to keep your pee (urine) clear or pale yellow.  Resume your normal diet. Avoid heavy or fried foods.  Avoid drinking alcohol for 24 hours or as told by your doctor.  Only take medicines as told by your doctor. If a tissue sample (biopsy) was taken during the procedure:   Do not take aspirin or blood thinners for 7 days, or as told by your doctor.  Do not drink alcohol for 7 days, or as told by your doctor.  Eat soft foods for the first 24 hours. GET HELP IF: You still have a small amount of blood in your poop (stool) 2-3 days after the procedure. GET HELP RIGHT AWAY IF:  You have more than a small amount of blood in your poop.  You see clumps of tissue (blood clots) in your poop.  Your belly is puffy (swollen).  You feel sick to your stomach (nauseous) or throw up (vomit).  You have a fever.  You have belly pain that gets worse and medicine does not help. MAKE SURE YOU:  Understand these instructions.  Will watch your condition.  Will get help  right away if you are not doing well or get worse. Document Released: 08/24/2010 Document Revised: 07/27/2013 Document Reviewed: 03/29/2013 [facility] Patient Information 2015 [place], [place]. This information is not intended to replace advice given to you by your health care provider. Make sure you discuss any questions you have with your health care provider.  Esophagogastroduodenoscopy Care After Refer to this sheet in the next few weeks. These instructions provide you with information on caring for yourself after your procedure. Your caregiver may also give you more specific instructions. Your treatment has been planned according to current medical practices, but problems sometimes occur. Call your caregiver if you have any problems or questions after your procedure.  HOME CARE INSTRUCTIONS  Do not eat or drink anything until the numbing medicine (local anesthetic) has worn off and your gag reflex has returned. You will know that the local anesthetic has worn off when you can swallow comfortably.  Do not drive for 12 hours after the procedure or as directed by your caregiver.  Only take medicines as directed by your caregiver. SEEK MEDICAL CARE IF:   You cannot stop coughing.  You are not urinating at all or less than usual. SEEK IMMEDIATE MEDICAL CARE IF:  You have difficulty swallowing.  You cannot eat or drink.  You have worsening throat or chest pain.  You have dizziness, lightheadedness, or you faint.  You have nausea or vomiting.  You have chills.  You have a fever.  You have severe abdominal pain.  You have black, tarry, or bloody stools. Document Released: 07/08/2012 Document Reviewed: 07/08/2012 [facility] Patient Information 2015 [place]. This information is not intended to replace advice given to you by your health care provider. Make sure you discuss any questions you have with your health care provider.  Colonoscopy, Care After Refer to this sheet in the  next few weeks. These instructions provide you with information on caring for yourself after your procedure. Your health care provider may also give you more specific instructions. Your treatment has been planned according to current medical practices, but problems sometimes occur. Call your health care provider if you have any problems or questions after your procedure. WHAT TO EXPECT AFTER THE PROCEDURE  After your procedure, it is typical to have the following:  A small amount of blood in your stool.  Moderate amounts of gas and mild abdominal cramping or bloating. HOME CARE INSTRUCTIONS  Do not drive, operate machinery, or sign important documents for 24 hours.  You may shower and resume your regular physical activities, but move at a slower pace for the first 24 hours.  Take frequent rest periods for the first 24 hours.  Walk around or put a warm pack on your abdomen to help reduce abdominal cramping and bloating.  Drink enough fluids to keep your urine clear or pale yellow.  You may resume your normal diet as instructed by your health care provider. Avoid heavy or fried foods that are hard to digest.  Avoid drinking alcohol for 24 hours or as instructed by your health  care provider.  Only take over-the-counter or prescription medicines as directed by your health care provider.  If a tissue sample (biopsy) was taken during your procedure:  Do not take aspirin or blood thinners for 7 days, or as instructed by your health care provider.  Do not drink alcohol for 7 days, or as instructed by your health care provider.  Eat soft foods for the first 24 hours. SEEK MEDICAL CARE IF: You have persistent spotting of blood in your stool 2-3 days after the procedure. SEEK IMMEDIATE MEDICAL CARE IF:  You have more than a small spotting of blood in your stool.  You pass large blood clots in your stool.  Your abdomen is swollen (distended).  You have nausea or vomiting.  You have a  fever.  You have increasing abdominal pain that is not relieved with medicine. Document Released: 03/05/2004 Document Revised: 05/12/2013 Document Reviewed: 03/29/2013 [facility] Patient Information 2015 [place], [place]. This information is not intended to replace advice given to you by your health care provider. Make sure you discuss any questions you have with your health care provider. Esophagogastroduodenoscopy Care After Refer to this sheet in the next few weeks. These instructions provide you with information on caring for yourself after your procedure. Your caregiver may also give you more specific instructions. Your treatment has been planned according to current medical practices, but problems sometimes occur. Call your caregiver if you have any problems or questions after your procedure.  HOME CARE INSTRUCTIONS  Do not eat or drink anything until the numbing medicine (local anesthetic) has worn off and your gag reflex has returned. You will know that the local anesthetic has worn off when you can swallow comfortably.  Do not drive for 12 hours after the procedure or as directed by your caregiver.  Only take medicines as directed by your caregiver. SEEK MEDICAL CARE IF:   You cannot stop coughing.  You are not urinating at all or less than usual. SEEK IMMEDIATE MEDICAL CARE IF:  You have difficulty swallowing.  You cannot eat or drink.  You have worsening throat or chest pain.  You have dizziness, lightheadedness, or you faint.  You have nausea or vomiting.  You have chills.  You have a fever.  You have severe abdominal pain.  You have black, tarry, or bloody stools. Document Released: 07/08/2012 Document Reviewed: 07/08/2012 [facility] Patient Information 2015 [place]. This information is not intended to replace advice given to you by your health care provider. Make sure you discuss any questions you have with your health care provider.

## 2014-04-20 NOTE — Transfer of Care (Signed)
Immediate Anesthesia Transfer of Care Note  Patient: Tracy [name]  Procedure(s) Performed: Procedure(s): ESOPHAGOGASTRODUODENOSCOPY (EGD) WITH PROPOFOL (N/A) COLONOSCOPY WITH PROPOFOL (N/A)  Patient Location: PACU  Anesthesia Type:MAC  Level of Consciousness: awake, sedated and patient cooperative  Airway & Oxygen Therapy: Patient Spontanous Breathing and Patient connected to face mask oxygen  Post-op Assessment: Report given to PACU RN and Post -op Vital signs reviewed and stable  Post vital signs: Reviewed and stable  Complications: No apparent anesthesia complications

## 2014-04-20 NOTE — Op Note (Signed)
[facility] [place], [address]   ENDOSCOPY PROCEDURE REPORT  PATIENT: Tracy [name], Tracy [name]  MR#: [id number] BIRTHDATE: October 31, 1965 , 48  yrs. old GENDER: Female ENDOSCOPIST: Arta Silence, MD REFERRED BY:  Donald Prose, M.D. PROCEDURE DATE:  04/20/2014 PROCEDURE:  EGD, diagnostic ASA CLASS:     Class III INDICATIONS:  abdominal pain. MEDICATIONS: MAC sedation, administered by CRNA TOPICAL ANESTHETIC:  DESCRIPTION OF PROCEDURE: After the risks benefits and alternatives of the procedure were thoroughly explained, informed consent was obtained.  The Pentax Gastroscope [id number] endoscope was introduced through the mouth and advanced to the second portion of the duodenum. Without limitations.  The instrument was slowly withdrawn as the mucosa was fully examined.     Findings:  Normal esophagus without inflammation, stricture, mass, varices, or Barrett's epithelium.  Mild diffuse gastritis, appearance not atypical for portal gastropathy, otherwise normal stomach and pylorus.  Normal duodenum to the second portion of the duodenum.              The scope was then withdrawn from the patient and the procedure completed.  ENDOSCOPIC IMPRESSION:     As above.  No explanation for patient's abdominal pain was identified.  RECOMMENDATIONS:     1.  Watch for potential complications of procedure. 2.  Consider fibroscan to assess non-invasively for hepatic fibrosis/cirrhosis. 3.  Proceed with colonoscopy.  eSigned:  Arta Silence, MD 04/20/2014 9:27 AM   CC:

## 2014-04-20 NOTE — Op Note (Signed)
[facility] [place], [address]   COLONOSCOPY PROCEDURE REPORT  PATIENT: Tracy, [name]  MR#: [id number] BIRTHDATE: [birth year] , 48  yrs. old GENDER: Female ENDOSCOPIST: Arta Silence, MD REFERRED KG:[id number] Nancy Fetter, M.D. PROCEDURE DATE:  04/20/2014 PROCEDURE:   Colonoscopy with snare polypectomy ASA CLASS:   Class III INDICATIONS:change in bowel habits, abdominal pain, family history colon polyps (brother). MEDICATIONS: MAC sedation, administered by CRNA  DESCRIPTION OF PROCEDURE:   After the risks benefits and alternatives of the procedure were thoroughly explained, informed consent was obtained.  A digital rectal exam revealed external hemorrhoids.   The Pentax Adult Colonscope [id number]  endoscope was introduced through the anus and advanced to the cecum, which was identified by both the appendix and ileocecal valve. No adverse events experienced.   The quality of the prep was adequate.  The instrument was then slowly withdrawn as the colon was fully examined.    Findings:  External hemorrhoids, otherwise normal digital rectal exam.  Prep quality was adequate.  No diverticula evident.  33mm cecal polyp removed with cold snare.  No other polyps, masses, vascular ectasias, or inflammatory changes were seen.  Retroflexed view of rectum normal.  Withdrawal time 11 minutes. Withdrawal time was   .  The scope was withdrawn and the procedure completed.  ENDOSCOPIC IMPRESSION:     As above.  No explanation for patient's abdomnal pain, change in bowel habits identified.  RECOMMENDATIONS:     1.  Watch for potential complications of procedure. 2.  Await polypectomy results. 3.  Proceed with management as needed for adnexal abnormality, up to and including surgery, whatever is deemed necessary by GYN. 4.  Repeat colonoscopy in 5 years. 5.  Follow-up with [facility] in 2-3 months.  eSigned:  Arta Silence, MD 04/20/2014 9:33 AM   cc:

## 2014-04-20 NOTE — H&P (Signed)
Patient interval history reviewed.  Patient examined again.  There has been no change from documented H/P dated 04/19/14 (scanned into chart from our office) except as documented above.  Assessment:  1.  Abdominal pain. 2.  Abnormal bowel habits (alternating diarrhea and constipation).  Plan:  1.  Endoscopy. 2.  Risks (bleeding, infection, bowel perforation that could require surgery, sedation-related changes in cardiopulmonary systems), benefits (identification and possible treatment of source of symptoms, exclusion of certain causes of symptoms), and alternatives (watchful waiting, radiographic imaging studies, empiric medical treatment) of upper endoscopy (EGD) were explained to patient/family in detail and patient wishes to proceed. 3.  Colonoscopy. 4.  Risks (bleeding, infection, bowel perforation that could require surgery, sedation-related changes in cardiopulmonary systems), benefits (identification and possible treatment of source of symptoms, exclusion of certain causes of symptoms), and alternatives (watchful waiting, radiographic imaging studies, empiric medical treatment) of colonoscopy were explained to patient/family in detail and patient wishes to proceed.

## 2014-04-20 NOTE — Anesthesia Postprocedure Evaluation (Signed)
  Anesthesia Post-op Note  Patient: Tracy [name]  Procedure(s) Performed: Procedure(s) (LRB): ESOPHAGOGASTRODUODENOSCOPY (EGD) WITH PROPOFOL (N/A) COLONOSCOPY WITH PROPOFOL (N/A)  Patient Location: PACU  Anesthesia Type: MAC  Level of Consciousness: awake and alert   Airway and Oxygen Therapy: Patient Spontanous Breathing  Post-op Pain: mild  Post-op Assessment: Post-op Vital signs reviewed, Patient's Cardiovascular Status Stable, Respiratory Function Stable, Patent Airway and No signs of Nausea or vomiting  Last Vitals:  Filed Vitals:   04/20/14 0950  BP: 131/59  Pulse: 73  Temp:   Resp: 17    Post-op Vital Signs: stable   Complications: No apparent anesthesia complications

## 2014-04-21 ENCOUNTER — Encounter (HOSPITAL_COMMUNITY): Payer: Self-pay | Admitting: Gastroenterology

## 2014-05-05 ENCOUNTER — Encounter: Payer: Self-pay | Admitting: Internal Medicine

## 2014-05-05 ENCOUNTER — Ambulatory Visit (INDEPENDENT_AMBULATORY_CARE_PROVIDER_SITE_OTHER): Payer: 59 | Admitting: Internal Medicine

## 2014-05-05 ENCOUNTER — Other Ambulatory Visit: Payer: Self-pay | Admitting: *Deleted

## 2014-05-05 VITALS — BP 134/82 | HR 92 | Temp 98.1°F | Resp 12 | Wt 334.0 lb

## 2014-05-05 DIAGNOSIS — E1165 Type 2 diabetes mellitus with hyperglycemia: Secondary | ICD-10-CM

## 2014-05-05 DIAGNOSIS — IMO0002 Reserved for concepts with insufficient information to code with codable children: Secondary | ICD-10-CM

## 2014-05-05 DIAGNOSIS — Z23 Encounter for immunization: Secondary | ICD-10-CM

## 2014-05-05 DIAGNOSIS — E039 Hypothyroidism, unspecified: Secondary | ICD-10-CM

## 2014-05-05 MED ORDER — GLIPIZIDE 10 MG PO TABS
10.0000 mg | ORAL_TABLET | Freq: Two times a day (BID) | ORAL | Status: DC
Start: 2014-05-05 — End: 2014-06-23

## 2014-05-05 NOTE — Patient Instructions (Signed)
Please continue:  - Metformin XR 1000 2x a day  - Lantus 16 units at bedtime.  - Januvia 100 mg daily in am.  - Glipizide 10 mg 2x a day.   Continue Levothyroxine 300 mcg daily for now.  Please stop at the lab.  Please return in 3 months with your sugar log.

## 2014-05-05 NOTE — Progress Notes (Signed)
Patient ID: Tracy [name], female   DOB: [birth year], 48 y.o.   MRN: [id number]  HPI: Tracy [name] is a 48 y.o.-year-old female, returning for f/u for DM2, dx in 06/01/2013, uncontrolled, without complications and  hypothyroidism. Last visit 2.5 mo ago.  She is very depressed. Her psychiatrist took her out of work in September.  DM2: Last hemoglobin A1c was Lab Results  Component Value Date   HGBA1C 7.4* 01/20/2014   HGBA1C 5.7 07/04/2011   HGBA1C 5.4 02/08/2010   (06/04/2013) HbA1c 11.4%.  Pt is on a regimen of: - Metformin XR 1000 bid. She did not try the immediate release. - Lantus 16 units at bedtime. - Januvia 100 mg daily in am. Glipizide 5 mg bid - added 02/2014. We stopped Glipizide and added Januvia.  Pt was checking  her sugars 2x a day, not lately. No recent checks, but they improved after starting the Glipizide. Sugars 100-200: - before meals: 100-110 - after meals: <180 usually No lows. Lowest sugar was 82; ? Level of hypoglycemia awareness Uses a One Touch Verio IQ meter.  She went to the diabetes class, 48 [facility].  - no CKD, last BUN/creatinine:  Lab Results  Component Value Date   BUN 12 02/28/2014   CREATININE 0.90 02/28/2014  - last eye exam was in 04/2014. No DR. She has blurry vision on and off. - no numbness and tingling in her feet.  Hypothyroidism: - she is on Levothyroxine 300 mcg in am (increased from 200 mcg, 1 mo ago) - She was staking Zantac 2x a day - separated from Levothyroxine - now stopped  She c/o: - fatigue >> better - dry skin - constipation  ROS: Constitutional: + weight gain, + fatigue, no subjective hyperthermia, + hot flashes Eyes:no blurry vision, no xerophthalmia ENT: no sore throat, no nodules palpated in throat, no dysphagia/odynophagia, no hoarseness Cardiovascular: no CP/SOB/palpitations/leg swelling Respiratory: no cough/SOB Gastrointestinal: no N/V/D/+C/+ heartburn Musculoskeletal: + muscle  aches/no joint aches Skin: no rashes, + itching Neurological: no tremors/numbness/tingling/dizziness Low libido.  I reviewed pt's medications, allergies, PMH, social hx, family hx and no changes required, except as mentioned above.  Patient mentioned papillary thyroid cancer in her PMH, however, per review of the thyroid pathology after thyroidectomy, this was benign  PE: BP 134/82  Pulse 92  Temp(Src) 98.1 F (36.7 C) (Oral)  Resp 12  Wt 334 lb (151.501 kg)  SpO2 97% Wt Readings from Last 3 Encounters:  05/05/14 334 lb (151.501 kg)  04/20/14 335 lb (151.955 kg)  04/20/14 335 lb (151.955 kg)   Constitutional: obese Eyes: PERRLA, EOMI, no exophthalmos ENT: moist mucous membranes, no thyromegaly, no cervical lymphadenopathy Cardiovascular: RRR, No MRG Respiratory: CTA B Gastrointestinal: abdomen soft, NT, ND, BS+ Musculoskeletal: no deformities, strength intact in all 4 Skin: moist, warm, no rashes Neurological: no tremor with outstretched hands, DTR normal in all 4  ASSESSMENT: 1. DM2, uncontrolled, without complications  2. Hypothyroidism - post-surgical  PLAN:  1. Patient with uncontrolled DM2, on oral antidiabetic regimen, with increased sugars due to stress/depression in the last months. We added Glipizide which apparently helped, but no recent sugar checks.  - I suggested to:  Patient Instructions  Please continue:  - Metformin XR 1000 2x a day  - Lantus 16 units at bedtime.  - Januvia 100 mg daily in am.  - Glipizide 10 mg 2x a day.  Please stop at the lab. Please return in 3 months with your sugar log.  - restart  checking sugars at different times of the day - check 3 times a day, rotating checks - advised for yearly eye exams >> she is up to date - will give her the PNA vaccine; will get the flu vaccine through work - Return to clinic in 1 mo with sugar log   2. Hypothyroidism - post-surgical - at previous visits, we separated the Levothyroxine from the  Ranitidine and decrease the dose to 200 mcg daily >> next TFTs were normal. However, she then restarted the Ranitidine >> TSH raised and she felt poorly >> asked me to allow her to increase the dose of LT4 back to 300 mcg >> we started this dose 1 mo ago. Since then, she stopped the Ranitidine. - will recheck TSH and fT4 today >> we discussed that we may slowly need to decrease the dose   Office Visit on 05/05/2014  Component Date Value Ref Range Status  . Hemoglobin A1C 05/05/2014 8.3* 4.6 - 6.5 % Final   Glycemic Control Guidelines for People with Diabetes:Non Diabetic:  <6%Goal of Therapy: <7%Additional Action Suggested:  >8%   . TSH 05/05/2014 8.52* 0.35 - 4.50 uIU/mL Final  . Free T4 05/05/2014 0.85  0.60 - 1.60 ng/dL Final   Msg sent: Dear Ms Tracy [name], The HbA1c is worse. The TSH is better, not very low, so continue the same dose of Levothyroxine for now (300 mcg daily)- will need another check when you come back. Sincerely, Philemon Kingdom MD

## 2014-05-06 LAB — HEMOGLOBIN A1C: HEMOGLOBIN A1C: 8.3 % — AB (ref 4.6–6.5)

## 2014-05-06 LAB — T4, FREE: Free T4: 0.85 ng/dL (ref 0.60–1.60)

## 2014-05-06 LAB — TSH: TSH: 8.52 u[IU]/mL — ABNORMAL HIGH (ref 0.35–4.50)

## 2014-05-20 ENCOUNTER — Other Ambulatory Visit: Payer: Self-pay

## 2014-05-31 ENCOUNTER — Other Ambulatory Visit (HOSPITAL_COMMUNITY): Payer: 59 | Attending: Psychiatry | Admitting: Psychiatry

## 2014-05-31 ENCOUNTER — Encounter (HOSPITAL_COMMUNITY): Payer: Self-pay

## 2014-05-31 DIAGNOSIS — G4700 Insomnia, unspecified: Secondary | ICD-10-CM | POA: Insufficient documentation

## 2014-05-31 DIAGNOSIS — E119 Type 2 diabetes mellitus without complications: Secondary | ICD-10-CM | POA: Diagnosis not present

## 2014-05-31 DIAGNOSIS — F332 Major depressive disorder, recurrent severe without psychotic features: Secondary | ICD-10-CM | POA: Insufficient documentation

## 2014-05-31 DIAGNOSIS — F4310 Post-traumatic stress disorder, unspecified: Secondary | ICD-10-CM | POA: Insufficient documentation

## 2014-05-31 DIAGNOSIS — Z609 Problem related to social environment, unspecified: Secondary | ICD-10-CM | POA: Diagnosis not present

## 2014-05-31 DIAGNOSIS — F411 Generalized anxiety disorder: Secondary | ICD-10-CM | POA: Insufficient documentation

## 2014-05-31 DIAGNOSIS — I10 Essential (primary) hypertension: Secondary | ICD-10-CM | POA: Insufficient documentation

## 2014-05-31 DIAGNOSIS — K219 Gastro-esophageal reflux disease without esophagitis: Secondary | ICD-10-CM | POA: Insufficient documentation

## 2014-05-31 DIAGNOSIS — E780 Pure hypercholesterolemia: Secondary | ICD-10-CM | POA: Diagnosis not present

## 2014-05-31 DIAGNOSIS — Z62810 Personal history of physical and sexual abuse in childhood: Secondary | ICD-10-CM | POA: Diagnosis not present

## 2014-05-31 DIAGNOSIS — E039 Hypothyroidism, unspecified: Secondary | ICD-10-CM | POA: Diagnosis not present

## 2014-05-31 DIAGNOSIS — F331 Major depressive disorder, recurrent, moderate: Secondary | ICD-10-CM

## 2014-05-31 NOTE — Progress Notes (Signed)
    Daily Group Progress Note  Program: IOP  Group Time: 9:00-10:30  Participation Level: Active  Behavioral Response: Appropriate  Type of Therapy:  Group Therapy  Summary of Progress: Pt. Met with case manager and psychiatrist.     Group Time: 10:30-12:00  Participation Level:  Active  Behavioral Response: Appropriate  Type of Therapy: Psycho-education Group  Summary of Progress: Pt. Met with case manager and psychiatrist.  Brown, Jennifer B, LPC 
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
## 2014-06-01 ENCOUNTER — Other Ambulatory Visit (HOSPITAL_COMMUNITY): Payer: 59 | Admitting: Psychiatry

## 2014-06-01 DIAGNOSIS — F331 Major depressive disorder, recurrent, moderate: Secondary | ICD-10-CM

## 2014-06-01 DIAGNOSIS — F4310 Post-traumatic stress disorder, unspecified: Secondary | ICD-10-CM

## 2014-06-01 DIAGNOSIS — F332 Major depressive disorder, recurrent severe without psychotic features: Secondary | ICD-10-CM | POA: Diagnosis not present
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
## 2014-06-02 ENCOUNTER — Other Ambulatory Visit (HOSPITAL_COMMUNITY): Payer: 59 | Admitting: Psychiatry

## 2014-06-02 DIAGNOSIS — F331 Major depressive disorder, recurrent, moderate: Secondary | ICD-10-CM

## 2014-06-02 DIAGNOSIS — F332 Major depressive disorder, recurrent severe without psychotic features: Secondary | ICD-10-CM | POA: Diagnosis not present

## 2014-06-02 NOTE — Progress Notes (Signed)
    Daily Group Progress Note  Program: IOP  Group Time: 9:00-10:30  Participation Level: Active  Behavioral Response: Appropriate  Type of Therapy:  Group Therapy  Summary of Progress: Pt. Began group anxious and tearful. Pt. Provided feedback to others regarding patterns of excessive caregiving. Pt. Was reluctant to share her personal story or how she was feeling.      Group Time: 10:30-12:00  Participation Level:  Active  Behavioral Response: Appropriate  Type of Therapy: Psycho-education Group  Summary of Progress: Pt. Participated in guided meditation and breathwork instruction.   Nancie Neas, LPC

## 2014-06-03 ENCOUNTER — Other Ambulatory Visit (HOSPITAL_COMMUNITY): Payer: 59 | Admitting: Psychiatry

## 2014-06-03 DIAGNOSIS — F331 Major depressive disorder, recurrent, moderate: Secondary | ICD-10-CM

## 2014-06-03 DIAGNOSIS — F332 Major depressive disorder, recurrent severe without psychotic features: Secondary | ICD-10-CM | POA: Diagnosis not present
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
## 2014-06-06 ENCOUNTER — Other Ambulatory Visit (HOSPITAL_COMMUNITY): Payer: 59 | Attending: Psychiatry | Admitting: Psychiatry

## 2014-06-06 ENCOUNTER — Telehealth: Payer: Self-pay | Admitting: Internal Medicine

## 2014-06-06 ENCOUNTER — Encounter (HOSPITAL_COMMUNITY): Payer: Self-pay | Admitting: Emergency Medicine

## 2014-06-06 ENCOUNTER — Emergency Department (HOSPITAL_COMMUNITY): Payer: 59

## 2014-06-06 ENCOUNTER — Emergency Department (HOSPITAL_COMMUNITY)
Admission: EM | Admit: 2014-06-06 | Discharge: 2014-06-06 | Disposition: A | Discharge status: Home / Self Care | Payer: 59 | Attending: Emergency Medicine | Admitting: Emergency Medicine

## 2014-06-06 DIAGNOSIS — E1165 Type 2 diabetes mellitus with hyperglycemia: Secondary | ICD-10-CM | POA: Diagnosis present

## 2014-06-06 DIAGNOSIS — F4310 Post-traumatic stress disorder, unspecified: Secondary | ICD-10-CM

## 2014-06-06 DIAGNOSIS — Z794 Long term (current) use of insulin: Secondary | ICD-10-CM | POA: Insufficient documentation

## 2014-06-06 DIAGNOSIS — F411 Generalized anxiety disorder: Secondary | ICD-10-CM | POA: Diagnosis not present

## 2014-06-06 DIAGNOSIS — F331 Major depressive disorder, recurrent, moderate: Secondary | ICD-10-CM

## 2014-06-06 DIAGNOSIS — K219 Gastro-esophageal reflux disease without esophagitis: Secondary | ICD-10-CM | POA: Insufficient documentation

## 2014-06-06 DIAGNOSIS — E039 Hypothyroidism, unspecified: Secondary | ICD-10-CM | POA: Insufficient documentation

## 2014-06-06 DIAGNOSIS — I10 Essential (primary) hypertension: Secondary | ICD-10-CM | POA: Insufficient documentation

## 2014-06-06 DIAGNOSIS — Z87891 Personal history of nicotine dependence: Secondary | ICD-10-CM | POA: Diagnosis not present

## 2014-06-06 DIAGNOSIS — R079 Chest pain, unspecified: Secondary | ICD-10-CM | POA: Diagnosis not present

## 2014-06-06 DIAGNOSIS — Z9851 Tubal ligation status: Secondary | ICD-10-CM | POA: Insufficient documentation

## 2014-06-06 DIAGNOSIS — Z609 Problem related to social environment, unspecified: Secondary | ICD-10-CM | POA: Insufficient documentation

## 2014-06-06 DIAGNOSIS — F329 Major depressive disorder, single episode, unspecified: Secondary | ICD-10-CM | POA: Diagnosis not present

## 2014-06-06 DIAGNOSIS — Z79899 Other long term (current) drug therapy: Secondary | ICD-10-CM | POA: Insufficient documentation

## 2014-06-06 DIAGNOSIS — F332 Major depressive disorder, recurrent severe without psychotic features: Secondary | ICD-10-CM | POA: Diagnosis not present

## 2014-06-06 DIAGNOSIS — R0602 Shortness of breath: Secondary | ICD-10-CM | POA: Insufficient documentation

## 2014-06-06 DIAGNOSIS — E119 Type 2 diabetes mellitus without complications: Secondary | ICD-10-CM | POA: Insufficient documentation

## 2014-06-06 DIAGNOSIS — Z9049 Acquired absence of other specified parts of digestive tract: Secondary | ICD-10-CM | POA: Diagnosis not present

## 2014-06-06 DIAGNOSIS — F419 Anxiety disorder, unspecified: Secondary | ICD-10-CM | POA: Insufficient documentation

## 2014-06-06 DIAGNOSIS — Z791 Long term (current) use of non-steroidal anti-inflammatories (NSAID): Secondary | ICD-10-CM | POA: Insufficient documentation

## 2014-06-06 DIAGNOSIS — R350 Frequency of micturition: Secondary | ICD-10-CM | POA: Diagnosis not present

## 2014-06-06 DIAGNOSIS — R739 Hyperglycemia, unspecified: Secondary | ICD-10-CM

## 2014-06-06 DIAGNOSIS — M1990 Unspecified osteoarthritis, unspecified site: Secondary | ICD-10-CM | POA: Diagnosis not present

## 2014-06-06 DIAGNOSIS — Z87442 Personal history of urinary calculi: Secondary | ICD-10-CM | POA: Insufficient documentation

## 2014-06-06 LAB — URINALYSIS, ROUTINE W REFLEX MICROSCOPIC
Bilirubin Urine: NEGATIVE
HGB URINE DIPSTICK: NEGATIVE
Ketones, ur: NEGATIVE mg/dL
LEUKOCYTES UA: NEGATIVE
Nitrite: NEGATIVE
PH: 5 (ref 5.0–8.0)
Protein, ur: NEGATIVE mg/dL
Specific Gravity, Urine: 1.043 — ABNORMAL HIGH (ref 1.005–1.030)
Urobilinogen, UA: 0.2 mg/dL (ref 0.0–1.0)

## 2014-06-06 LAB — I-STAT TROPONIN, ED: TROPONIN I, POC: 0 ng/mL (ref 0.00–0.08)

## 2014-06-06 LAB — CBC
HCT: 40.7 % (ref 36.0–46.0)
Hemoglobin: 13.4 g/dL (ref 12.0–15.0)
MCH: 28.5 pg (ref 26.0–34.0)
MCHC: 32.9 g/dL (ref 30.0–36.0)
MCV: 86.6 fL (ref 78.0–100.0)
PLATELETS: 163 10*3/uL (ref 150–400)
RBC: 4.7 MIL/uL (ref 3.87–5.11)
RDW: 14 % (ref 11.5–15.5)
WBC: 5.1 10*3/uL (ref 4.0–10.5)

## 2014-06-06 LAB — CBG MONITORING, ED
GLUCOSE-CAPILLARY: 466 mg/dL — AB (ref 70–99)
GLUCOSE-CAPILLARY: 331 mg/dL — AB (ref 70–99)
Glucose-Capillary: 435 mg/dL — ABNORMAL HIGH (ref 70–99)
Glucose-Capillary: 342 mg/dL — ABNORMAL HIGH (ref 70–99)

## 2014-06-06 LAB — COMPREHENSIVE METABOLIC PANEL
ALBUMIN: 3.7 g/dL (ref 3.5–5.2)
ALT: 85 U/L — AB (ref 0–35)
AST: 88 U/L — ABNORMAL HIGH (ref 0–37)
Alkaline Phosphatase: 134 U/L — ABNORMAL HIGH (ref 39–117)
Anion gap: 17 — ABNORMAL HIGH (ref 5–15)
BUN: 10 mg/dL (ref 6–23)
CHLORIDE: 95 meq/L — AB (ref 96–112)
CO2: 21 mEq/L (ref 19–32)
CREATININE: 0.73 mg/dL (ref 0.50–1.10)
Calcium: 9.3 mg/dL (ref 8.4–10.5)
GFR calc Af Amer: >90 mL/min (ref >90)
GFR calc non Af Amer: >90 mL/min (ref >90)
Glucose, Bld: 445 mg/dL — ABNORMAL HIGH (ref 70–99)
Potassium: 4.2 mEq/L (ref 3.7–5.3)
SODIUM: 133 meq/L — AB (ref 137–147)
Total Bilirubin: 0.5 mg/dL (ref 0.3–1.2)
Total Protein: 7.8 g/dL (ref 6.0–8.3)

## 2014-06-06 LAB — URINE MICROSCOPIC-ADD ON

## 2014-06-06 MED ORDER — INSULIN ASPART 100 UNIT/ML ~~LOC~~ SOLN
10.0000 [IU] | Freq: Once | SUBCUTANEOUS | Status: AC
  Administered 2014-06-06: 10 [IU] via SUBCUTANEOUS
  Filled 2014-06-06: qty 1

## 2014-06-06 MED ORDER — SODIUM CHLORIDE 0.9 % IV BOLUS (SEPSIS)
1000.0000 mL | Freq: Once | INTRAVENOUS | Status: AC
  Administered 2014-06-06: 1000 mL via INTRAVENOUS

## 2014-06-06 NOTE — Telephone Encounter (Signed)
Returned call to pt. She is concerned and is considering going to the ER. Please read note below and advise.

## 2014-06-06 NOTE — ED Provider Notes (Signed)
CSN: [id number]     Arrival date & time 06/06/14  1432 History   First MD Initiated Contact with Patient 06/06/14 1503     Chief Complaint  Patient presents with  . Hyperglycemia  . Urinary Incontinence  . Suicidal     (Consider location/radiation/quality/duration/timing/severity/associated sxs/prior Treatment) HPI Comments: Patient with DM, HTN, HL, anxiety, depression, presents to the ED with a chief complaint of hyperglycemia.  She states that today her CBG was in the 400s.  She states that she normally takes metformin, lantus, and glipizide.  She has been taking her medications as directed.  She complains of multiple other symptoms as listed below: 1. Urinary frequency:  This has been ongoing for several days.  She states that sometimes she is unable to make it to the bathroom in time.  She denies any dysuria, hematuria, or vaginal discharge. 2. Chest Pain:  This has been ongoing intermittently for several months.  She states that it worsens when she gets very anxious or depressed.  She reports some associated SOB when this happens.  She denies history of heart problems, but does have significant risk factors.  The history is provided by the patient. No language interpreter was used.    Past Medical History  Diagnosis Date  . PCOS (polycystic ovarian syndrome)   . History of migraine headaches   . Hypothyroidism   . GERD (gastroesophageal reflux disease)   . History of kidney stones   . Arthritis     back, knees, right elbow  . Anxiety   . High cholesterol     no current med.  . Hypertension     under control with meds., has been on med. x 4 yr.  . Depression   . Achilles tendonitis 02/2013    right  . Acquired Haglund's deformity of right heel 02/2013  . 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 Insulin resistance     does not check blood sugar at home  . History of electroconvulsive therapy   . Dental crowns present   . Diabetes mellitus without complication   . PONV  (postoperative nausea and vomiting)     also hx. of emergence delirium 2007   Past Surgical History  Procedure Laterality Date  . Appendectomy    . Pilonidal cyst excision  1994  . Total thyroidectomy  2003  . Ovarian cyst surgery Left 1998  . Lumbar laminectomy      X 3  . Achilles tendon surgery Left 2007  . Excision haglund's deformity with achilles tendon repair Right 02/25/2013    Procedure: RIGHT ACHILLES DEBRIDEMENT AND RECONSTRUCTION;  HAGLUND'S EXCISION; GASTROC RECESSION AND FLEXOR HALLUCIS LONGUS TRANSFER;  Surgeon: Wylene Simmer, MD;  Location: [place];  Service: Orthopedics;  Laterality: Right;  . Tubal ligation Right 1984  . Esophagogastroduodenoscopy (egd) with propofol N/A 04/20/2014    Procedure: ESOPHAGOGASTRODUODENOSCOPY (EGD) WITH PROPOFOL;  Surgeon: Arta Silence, MD;  Location: [facility];  Service: Endoscopy;  Laterality: N/A;  . Colonoscopy with propofol N/A 04/20/2014    Procedure: COLONOSCOPY WITH PROPOFOL;  Surgeon: Arta Silence, MD;  Location: [facility];  Service: Endoscopy;  Laterality: N/A;   Family History  Problem Relation Age of Onset  . Breast cancer Mother   . Hypertension Mother   . Heart disease Father   . Depression Father   . Cancer Brother     THROAT  . Depression Brother   . Anxiety disorder Brother   . Depression Sister  History  Substance Use Topics  . Smoking status: Former Smoker -- 0.50 packs/day for 11 years    Types: Cigarettes    Quit date: 08/05/2013  . Smokeless tobacco: Never Used  . Alcohol Use: No   OB History    Gravida Para Term Preterm AB TAB SAB Ectopic Multiple Living   0              Review of Systems  Constitutional: Negative for fever and chills.  Respiratory: Positive for shortness of breath.   Cardiovascular: Positive for chest pain.  Gastrointestinal: Negative for nausea, vomiting, diarrhea and constipation.  Genitourinary: Positive for frequency. Negative for dysuria.  All other  systems reviewed and are negative.     Allergies  Sulfa antibiotics; Bupropion hcl; and Codeine  Home Medications   Prior to Admission medications   Medication Sig Start Date End Date Taking? Authorizing Provider  DULoxetine (CYMBALTA) 60 MG capsule Take 60 mg by mouth every morning.    Yes Historical Provider, MD  glipiZIDE (GLUCOTROL) 10 MG tablet Take 1 tablet (10 mg total) by mouth 2 (two) times daily. 05/05/14  Yes Philemon Kingdom, MD  ibuprofen (ADVIL,MOTRIN) 200 MG tablet Take 800 mg by mouth once as needed for moderate pain (muscle aches.).   Yes Historical Provider, MD  insulin glargine (LANTUS) 100 UNIT/ML injection Inject 16 Units into the skin at bedtime.   Yes Historical Provider, MD  L-Methylfolate 15 MG TABS Take 15 mg by mouth every morning.    Yes Historical Provider, MD  levothyroxine (SYNTHROID, LEVOTHROID) 200 MCG tablet Take 300 mcg by mouth daily before breakfast.    Yes Historical Provider, MD  lisinopril-hydrochlorothiazide (PRINZIDE,ZESTORETIC) 10-12.5 MG per tablet Take 1 tablet by mouth every morning.    Yes Historical Provider, MD  metFORMIN (GLUCOPHAGE-XR) 500 MG 24 hr tablet Take 100 mg by mouth daily with breakfast.   Yes Historical Provider, MD  ranitidine (ZANTAC) 150 MG tablet Take 150 mg by mouth daily as needed for heartburn (heartburn).   Yes Historical Provider, MD  sitaGLIPtin (JANUVIA) 100 MG tablet Take 100 mg by mouth daily.   Yes Historical Provider, MD  Vortioxetine HBr (BRINTELLIX) 10 MG TABS Take 10 mg by mouth daily.   Yes Historical Provider, MD  metFORMIN (GLUCOPHAGE-XR) 500 MG 24 hr tablet TAKE 2 TABLETS BY MOUTH 2 (TWO) TIMES DAILY WITH A MEAL. 04/15/14   Philemon Kingdom, MD  Vortioxetine HBr (BRINTELLIX) 5 MG TABS Take by mouth.    Historical Provider, MD   BP 122/69 mmHg  Pulse 104  Temp(Src) 97.9 F (36.6 C) (Oral)  Resp 16  SpO2 98% Physical Exam  Constitutional: She is oriented to person, place, and time. She appears  well-developed and well-nourished.  HENT:  Head: Normocephalic and atraumatic.  Eyes: Conjunctivae and EOM are normal. Pupils are equal, round, and reactive to light.  Neck: Normal range of motion. Neck supple.  Cardiovascular: Normal rate and regular rhythm.  Exam reveals no gallop and no friction rub.   No murmur heard. Pulmonary/Chest: Effort normal and breath sounds normal. No respiratory distress. She has no wheezes. She has no rales. She exhibits no tenderness.  Abdominal: Soft. Bowel sounds are normal. She exhibits no distension and no mass. There is no tenderness. There is no rebound and no guarding.  No focal abdominal tenderness, no RLQ tenderness or pain at McBurney's point, no RUQ tenderness or Murphy's sign, no left-sided abdominal tenderness, no fluid wave, or signs of peritonitis   Musculoskeletal:  Normal range of motion. She exhibits no edema or tenderness.  Neurological: She is alert and oriented to person, place, and time.  Skin: Skin is warm and dry.  Psychiatric: She has a normal mood and affect. Her behavior is normal. Judgment and thought content normal.  Nursing note and vitals reviewed.   ED Course  Procedures (including critical care time) Results for orders placed or performed during the hospital encounter of 06/06/14  CBC  Result Value Ref Range   WBC 5.1 4.0 - 10.5 K/uL   RBC 4.70 3.87 - 5.11 MIL/uL   Hemoglobin 13.4 12.0 - 15.0 g/dL   HCT 40.7 36.0 - 46.0 %   MCV 86.6 78.0 - 100.0 fL   MCH 28.5 26.0 - 34.0 pg   MCHC 32.9 30.0 - 36.0 g/dL   RDW 14.0 11.5 - 15.5 %   Platelets 163 150 - 400 K/uL  Comprehensive metabolic panel  Result Value Ref Range   Sodium 133 (L) 137 - 147 mEq/L   Potassium 4.2 3.7 - 5.3 mEq/L   Chloride 95 (L) 96 - 112 mEq/L   CO2 21 19 - 32 mEq/L   Glucose, Bld 445 (H) 70 - 99 mg/dL   BUN 10 6 - 23 mg/dL   Creatinine, Ser 0.73 0.50 - 1.10 mg/dL   Calcium 9.3 8.4 - 10.5 mg/dL   Total Protein 7.8 6.0 - 8.3 g/dL   Albumin 3.7 3.5  - 5.2 g/dL   AST 88 (H) 0 - 37 U/L   ALT 85 (H) 0 - 35 U/L   Alkaline Phosphatase 134 (H) 39 - 117 U/L   Total Bilirubin 0.5 0.3 - 1.2 mg/dL   GFR calc non Af Amer >90 >90 mL/min   GFR calc Af Amer >90 >90 mL/min   Anion gap 17 (H) 5 - 15  Urinalysis, Routine w reflex microscopic  Result Value Ref Range   Color, Urine YELLOW YELLOW   APPearance CLEAR CLEAR   Specific Gravity, Urine 1.043 (H) 1.005 - 1.030   pH 5.0 5.0 - 8.0   Glucose, UA >1000 (A) NEGATIVE mg/dL   Hgb urine dipstick NEGATIVE NEGATIVE   Bilirubin Urine NEGATIVE NEGATIVE   Ketones, ur NEGATIVE NEGATIVE mg/dL   Protein, ur NEGATIVE NEGATIVE mg/dL   Urobilinogen, UA 0.2 0.0 - 1.0 mg/dL   Nitrite NEGATIVE NEGATIVE   Leukocytes, UA NEGATIVE NEGATIVE  Urine microscopic-add on  Result Value Ref Range   Squamous Epithelial / LPF RARE RARE   WBC, UA 0-2 <3 WBC/hpf   Bacteria, UA RARE RARE  CBG monitoring, ED  Result Value Ref Range   Glucose-Capillary 435 (H) 70 - 99 mg/dL   Comment 1 Documented in Chart    Comment 2 Notify RN   CBG monitoring, ED  Result Value Ref Range   Glucose-Capillary 466 (H) 70 - 99 mg/dL  I-Stat Troponin, ED (not at [facility])  Result Value Ref Range   Troponin i, poc 0.00 0.00 - 0.08 ng/mL   Comment 3          CBG monitoring, ED  Result Value Ref Range   Glucose-Capillary 331 (H) 70 - 99 mg/dL  CBG monitoring, ED  Result Value Ref Range   Glucose-Capillary 342 (H) 70 - 99 mg/dL   Dg Chest 2 View  06/06/2014   CLINICAL DATA:  Mid chest pain for the past year. Uncontrolled diabetes. Extreme stress. Hypertension.  EXAM: CHEST  2 VIEW  COMPARISON:  02/20/2010.  FINDINGS: Normal sized heart.  Clear lungs with normal vascularity. Mild thoracic spine degenerative changes.  IMPRESSION: No acute abnormality.   Electronically Signed   By: Enrique Sack M.D.   On: 06/06/2014 15:54      EKG Interpretation None      MDM   Final diagnoses:  Chest pain  Hyperglycemia  Anxiety  Urinary frequency     Patient with hyperglycemia, urinary frequency, and intermittent chest pain x months.  Will check labs, give fluids, and reassess.    Patient states that she does have suicidal thoughts from time to time.  She does not have a plan.  She is being treated at [facility] daily.  She states that she will not hurt herself if she goes home.  Patient states that she is feeling much better. Discharged home. Glucoses improved with fluids and insulin.    Montine Circle, PA-C 06/07/14 [phone]

## 2014-06-06 NOTE — Telephone Encounter (Signed)
Called pt and advised her per Dr Arman Filter note. Pt understood and will go to the ED.

## 2014-06-06 NOTE — Discharge Instructions (Signed)

## 2014-06-06 NOTE — ED Notes (Signed)
Pt states that her CBG 400's today and that she has been having urinary incontinence for several days as well as dizzy and generally bad "like im getting the flu or something".

## 2014-06-06 NOTE — Telephone Encounter (Signed)
Yes, going to the ED would probably be best.

## 2014-06-06 NOTE — Telephone Encounter (Signed)
pts bs is out of control today this am before breakfast it was 374 2.5 hrs after eating was 474. She took 16 u lantus and the metformin and glipizide.  She is out of work until the 11/21 due to depression and anxiety, she throws up every morning, incontinent at night and cannot sleep pt really having a lot of issues.

## 2014-06-07 ENCOUNTER — Telehealth: Payer: Self-pay | Admitting: Internal Medicine

## 2014-06-07 ENCOUNTER — Other Ambulatory Visit (HOSPITAL_COMMUNITY): Payer: 59

## 2014-06-07 MED ORDER — INSULIN PEN NEEDLE 32G X 4 MM MISC: Status: DC

## 2014-06-07 MED ORDER — INSULIN GLARGINE 100 UNIT/ML ~~LOC~~ SOLN: 30.0000 [IU] | Freq: Every day | SUBCUTANEOUS | Status: DC

## 2014-06-07 MED ORDER — INSULIN ASPART 100 UNIT/ML FLEXPEN: 8.0000 [IU] | PEN_INJECTOR | Freq: Three times a day (TID) | SUBCUTANEOUS | Status: DC
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
## 2014-06-07 NOTE — Telephone Encounter (Signed)
Please read note below and advise.  

## 2014-06-07 NOTE — Telephone Encounter (Signed)
Patient states she went to ED and blood sugar is still high  345 8:15 am 06/07/14 She would like to know what she can do    Please advise patient   Thank you

## 2014-06-07 NOTE — Telephone Encounter (Signed)
Called pt and advised her per Dr Arman Filter note. Pt understood. Sent rx for Lantus, Novolog and pen needles to pt's pharmacy.

## 2014-06-07 NOTE — Telephone Encounter (Signed)
Tracy [name], please call her to increase Lantus to 30 units. We will also need mealtime insulin. This is injected 15 min before every meal. Let's start by injecting 8 units before each main meal. Please call in NovoLog 8 units tid #5 pens with 2 refills and also 32x4 pen needles. Please have her call back in 2 days with her sugars. Stay hydrated.

## 2014-06-08 ENCOUNTER — Other Ambulatory Visit (HOSPITAL_COMMUNITY): Payer: 59 | Admitting: Psychiatry

## 2014-06-08 ENCOUNTER — Other Ambulatory Visit: Payer: Self-pay | Admitting: *Deleted

## 2014-06-08 DIAGNOSIS — F331 Major depressive disorder, recurrent, moderate: Secondary | ICD-10-CM

## 2014-06-08 DIAGNOSIS — F332 Major depressive disorder, recurrent severe without psychotic features: Secondary | ICD-10-CM | POA: Diagnosis not present

## 2014-06-08 DIAGNOSIS — F4310 Post-traumatic stress disorder, unspecified: Secondary | ICD-10-CM

## 2014-06-08 MED ORDER — "INSULIN SYRINGE 31G X 5/16"" 0.5 ML MISC": Status: DC

## 2014-06-09 ENCOUNTER — Other Ambulatory Visit (HOSPITAL_COMMUNITY): Payer: 59 | Admitting: Psychiatry

## 2014-06-09 DIAGNOSIS — F331 Major depressive disorder, recurrent, moderate: Secondary | ICD-10-CM

## 2014-06-09 DIAGNOSIS — F332 Major depressive disorder, recurrent severe without psychotic features: Secondary | ICD-10-CM | POA: Diagnosis not present
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
## 2014-06-10 ENCOUNTER — Telehealth: Payer: Self-pay | Admitting: Internal Medicine

## 2014-06-10 ENCOUNTER — Other Ambulatory Visit (HOSPITAL_COMMUNITY): Payer: 59 | Admitting: Psychiatry

## 2014-06-10 DIAGNOSIS — F332 Major depressive disorder, recurrent severe without psychotic features: Secondary | ICD-10-CM | POA: Diagnosis not present

## 2014-06-10 DIAGNOSIS — F331 Major depressive disorder, recurrent, moderate: Secondary | ICD-10-CM

## 2014-06-10 DIAGNOSIS — F4310 Post-traumatic stress disorder, unspecified: Secondary | ICD-10-CM

## 2014-06-10 NOTE — Telephone Encounter (Signed)
Called and spoke with pt and pt verified medications.  Pt is aware of the new increase in medications and will call back next week with readings.
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
## 2014-06-10 NOTE — Progress Notes (Signed)
    Daily Group Progress Note  Program: IOP  Group Time: 9:00-10:30  Participation Level: Active  Behavioral Response: Appropriate  Type of Therapy:  Group Therapy  Summary of Progress: Pt. Reported good news about her job, feeling valued by her manager and her husband. Pt. Acknowledged that yesterday was a bad day for her, but that she woke up feeling encouraged and less worried about her future.     Group Time: 10:30-12:00  Participation Level:  Active  Behavioral Response: Appropriate  Type of Therapy: Psycho-education Group  Summary of Progress: Pt. Participated in discussion about developing self-compassion. Pt. Watched and discussed Marilynn Latino video.   [facility]

## 2014-06-10 NOTE — Telephone Encounter (Signed)
Glucose readings  06/08/14-2:53 AM 315; 8:35 AM 290; 12:50 pm 296; 7:05 PM 358: 11:35 pm 367  06/09/14- 8:50 AM 286; 11:05 am 300; 2:09 PM 190; 7:21 PM 287; 10:12 pm 252;  06/10/14 - 2:20 AM 272; 8:30 am 264; 11:42 AM 224;

## 2014-06-10 NOTE — Telephone Encounter (Signed)
Please check with her if she is taking the insulin as prescribed: - Lantus 30 units at bedtime - NovoLog insulin 8 units 3 times a day If she does, then let's increase the doses as follows: - Lantus 40 units at bedtime - NovoLog 12 units 3 times a day She should continue the oral medicines as she is taking them now. Please call at the beginning of next week with her sugars.

## 2014-06-13 ENCOUNTER — Other Ambulatory Visit (HOSPITAL_COMMUNITY): Payer: 59 | Admitting: Psychiatry

## 2014-06-13 ENCOUNTER — Telehealth: Payer: Self-pay | Admitting: Internal Medicine

## 2014-06-13 DIAGNOSIS — F331 Major depressive disorder, recurrent, moderate: Secondary | ICD-10-CM

## 2014-06-13 DIAGNOSIS — F332 Major depressive disorder, recurrent severe without psychotic features: Secondary | ICD-10-CM | POA: Diagnosis not present

## 2014-06-13 DIAGNOSIS — F4310 Post-traumatic stress disorder, unspecified: Secondary | ICD-10-CM

## 2014-06-13 NOTE — Telephone Encounter (Signed)
Patient calling in her blood sugar readings  Mon 11/9 1:40 pm 165 8:57 am 212 2:36 am 287  Sun 11/8 11:27 pm 309 7:16 pm 229 12:25 pm 238 10:37 am 264 3:17 am 334 2:01 am 296  Sat 11/7 11:50 pm 308 8:02 pm 250 12:24 pm 229 did not eat in am  Fri 11/6 4:13 pm 180  Thank you

## 2014-06-13 NOTE — Telephone Encounter (Signed)
Please read note below and advise.  

## 2014-06-13 NOTE — Telephone Encounter (Signed)
Let's increase the doses as follows: - Lantus 40 >> 50 units at bedtime - NovoLog 18 units 3 times a day before meals. Try to ask her to document her sugars as before or after meals (the befores are the most important) and report them this way, rather that by the time of the day. Call back in few days.

## 2014-06-14 ENCOUNTER — Other Ambulatory Visit (HOSPITAL_COMMUNITY): Payer: 59 | Admitting: Psychiatry

## 2014-06-14 DIAGNOSIS — F332 Major depressive disorder, recurrent severe without psychotic features: Secondary | ICD-10-CM | POA: Diagnosis not present

## 2014-06-14 NOTE — Progress Notes (Signed)
    Daily Group Progress Note  Program: IOP  Group Time: 9:00-10:30  Participation Level: Active  Behavioral Response: Appropriate  Type of Therapy:  Group Therapy  Summary of Progress: Pt. Reported that today was the first day she had checked "no" to not having thoughts of suicide or self-harm. Pt. Reported understanding the importance of medication and social/peer support.      Group Time: 10:30-12:00  Participation Level:  Active  Behavioral Response: Appropriate  Type of Therapy: Psycho-education Group  Summary of Progress: Pt. Participated in grief and loss group facilitated by Jeanella Craze.   Nancie Neas, LPC

## 2014-06-14 NOTE — Progress Notes (Signed)
Patient ID: Tracy [name], female   DOB: 1966-02-18, 48 y.o.   MRN: [id number] Psychiatric Specialty Exam: Physical Exam  ROS  Last menstrual period 05/05/2014.There is no weight on file to calculate BMI.  General Appearance: Well Groomed  Engineer, water::  Good  Speech:  Clear and Coherent  Volume:  Normal  Mood:  Anxious  Affect:  Appropriate  Thought Process:  Coherent and Logical  Orientation:  Full (Time, Place, and Person)  Thought Content:  Negative  Suicidal Thoughts:  No  Homicidal Thoughts:  No  Memory:  Immediate;   Good Recent;   Good Remote;   Good  Judgement:  Intact  Insight:  Good  Psychomotor Activity:  Normal  Concentration:  Good  Recall:  Good  Akathisia:  Negative  Handed:  Right  AIMS (if indicated):     Assets:  Communication Skills Desire for Improvement Financial Resources/Insurance Housing Social Support Talents/Skills Transportation Vocational/Educational  Sleep:   adequate  Late entry due to no access to the computer.  Originally I saw this person on June 08, 2014.  She is concerned about her medications and if they are adequate, concerned that she might not get better, concerned that she will not be ready to go back to work.  I was concerned about the combination of her diabetes and thyroid condition contributing to her condition.  When she returned to the group she was very upset that I had told her that she was hopeless.  That was nowhere close to my intent as I saw her as intelligent with good help with a good heart and consequently much hope though it will be hard.  We did talk about CBT as an intervention in the future.

## 2014-06-14 NOTE — Telephone Encounter (Signed)
Called pt and advised her per Dr Bluford Kaufmann note. Pt understood and will call back in a few days to report her sugar levels.

## 2014-06-15 ENCOUNTER — Other Ambulatory Visit (HOSPITAL_COMMUNITY): Payer: 59 | Admitting: Psychiatry

## 2014-06-15 ENCOUNTER — Telehealth (HOSPITAL_COMMUNITY): Payer: Self-pay | Admitting: Psychiatry

## 2014-06-15 NOTE — Progress Notes (Signed)
    Daily Group Progress Note  Program: IOP  Group Time: 9:00-10:30  Participation Level: Active  Behavioral Response: Appropriate  Type of Therapy:  Group Therapy  Summary of Progress: Pt. Presented with brightened mood, talked appropriately, responsive to group feedback. Pt. Reported that she was coping with worsening diabetes, anxiety about job transition, and planning to self-care during the holidays by creating healthier and safer family boundaries.      Group Time: 10:30-12:00  Participation Level:  Active  Behavioral Response: Appropriate  Type of Therapy: Psycho-education Group  Summary of Progress: Pt. Participated in discussion about learning to sit with difficult situations and moving from "stuck" places.  Nancie Neas, LPC

## 2014-06-16 ENCOUNTER — Other Ambulatory Visit (HOSPITAL_COMMUNITY): Payer: 59 | Admitting: Psychiatry

## 2014-06-16 DIAGNOSIS — F332 Major depressive disorder, recurrent severe without psychotic features: Secondary | ICD-10-CM | POA: Diagnosis not present

## 2014-06-16 NOTE — Progress Notes (Signed)
  [facility] Intensive Outpatient Program Discharge Summary  Tracy [name] [id number]  Admission date: 05/31/2014 Discharge date: 06/17/14  Reason for admission: depression and anxiety  Chemical Use History: none  Family of Origin Issues: none   Progress in Program Toward Treatment Goals: Depression is significantly less . Anxiety remains but is not overwhelming. has applied for a new job that she believes will reduce stress  Progress (rationale): believes her medications have helped and says talking in groups has helped considerably.  She has been cooperative in doing what was expected of her and that has helped as well.    Clarene Reamer, MD 06/16/2014

## 2014-06-17 ENCOUNTER — Other Ambulatory Visit (HOSPITAL_COMMUNITY): Payer: 59

## 2014-06-17 NOTE — Patient Instructions (Signed)
Patient completed [facility]-IOP today.  F/U with Drs. Redmond Pulling and [place].  Encouraged support groups.

## 2014-06-17 NOTE — Progress Notes (Signed)
Tracy [name] is a 48 y.o. , married, Caucasian, employed, female, who was referred per Dr. Chucky May; treatment for worsening depressive, anxiety (mild panic attacks), and passive SI. Pt denied a plan or intent. States deterrent is "fear." Discussed safety options with pt. Pt able to contract for safety. Hx of two previous suicide attempts: Age [age] pt overdosed on Excedrin. At age 89, pt overdosed on opiates, with one pint of liquor. Three previous psychiatric hospitalizations: Five yrs ago had ECT at [facility], [place] at [place], and 2003 [facility] for depression with SI. Family Hx: Depression (deceased father) and Depression and Anxiety (Sister and several brothers). Other symptoms included: Poor sleep, increased appetite (gained 15-20 lbs within three months), tearfulness, poor concentration, anhedonia, no motivation, loss of energy, ruminating, irritability, and feelings of hopelessness, helplessness, and worthlessness. Pt reports that she has been struggling with above symptoms since Spring 2015; but worsened September 2015 when she started having thyroid problems. Multiple stressors/triggers: 1) Husband of twelve yrs marriage. States that he is unemployed "again." He was working through a [employer], but lost his job two months ago. "He wants to go into trucking now. One expense after another, but no return. I can't keep paying everything." 2) Unresolved grief/loss issues: -Elderly mother has been residing in an assisted living facility for a couple of years. She has severe dementia. "She's always putting me down." -Pt doesn't have any kids due to infertility issues. -Pt is grieving loss of one sibling that died ~ two yrs ago. 3) Ongoing conflict with siblings. 4) Chronic pain/Medical: Back pain, pain in joints, PCOS. Pt also has HTN and Hypothyroidism.  Pt was scheduled to be discharged today, but husband phoned and left vm that pt was at the ED due  to chest pains.  A:  Will go ahead and discharge pt as scheduled; per pt's request.  F/U with Drs. Toy Care and Redmond Pulling.  Encouraged support groups.  R:  Pt receptive.

## 2014-06-17 NOTE — Progress Notes (Signed)
    Daily Group Progress Note  Program: IOP  Group Time: 9:00-10:30  Participation Level: Active  Behavioral Response: Appropriate  Type of Therapy:  Group Therapy  Summary of Progress: Pt.  Reported that depression had improved. Pt. Reported that she was feeling anxious about interviewing for new job next weeks. Pt. Reported excitement about the new position, but doubt about her worth.     Group Time: 10:30-12:00  Participation Level:  Active  Behavioral Response: Appropriate  Type of Therapy: Psycho-education Group  Summary of Progress: Pt. Participated in beach guided imagery.   Nancie Neas, LPC

## 2014-06-20 ENCOUNTER — Other Ambulatory Visit (HOSPITAL_COMMUNITY): Payer: 59

## 2014-06-20 ENCOUNTER — Telehealth: Payer: Self-pay | Admitting: Internal Medicine

## 2014-06-20 NOTE — Telephone Encounter (Signed)
B/S Readings  [facility] Nov 16 8:29am 155  Sun Nov 15  5:29am  129                     9:20am   153                     12:41am 174   Sat Nov 14  8:47pm   129  Fri Nov 13  9:00am  155                    7:12am  141                    2:00pm  113  Thu Nov 12 10:13pm 133                     6:08pm  144                     1:22pm  183                     9:00am  201

## 2014-06-20 NOTE — Telephone Encounter (Signed)
Excellent! Stay on the same insulin doses!

## 2014-06-20 NOTE — Telephone Encounter (Signed)
Please read below. Pt called in with there blood sugar readings.

## 2014-06-21 ENCOUNTER — Other Ambulatory Visit (HOSPITAL_COMMUNITY): Payer: 59

## 2014-06-21 NOTE — Telephone Encounter (Signed)
Called pt and advised her per Dr Gherghe's note. Pt understood.  

## 2014-06-22 ENCOUNTER — Other Ambulatory Visit (HOSPITAL_COMMUNITY): Payer: 59

## 2014-06-23 ENCOUNTER — Ambulatory Visit (INDEPENDENT_AMBULATORY_CARE_PROVIDER_SITE_OTHER): Payer: 59 | Admitting: Internal Medicine

## 2014-06-23 ENCOUNTER — Other Ambulatory Visit (HOSPITAL_COMMUNITY): Payer: 59

## 2014-06-23 ENCOUNTER — Other Ambulatory Visit: Payer: Self-pay | Admitting: Internal Medicine

## 2014-06-23 ENCOUNTER — Encounter: Payer: Self-pay | Admitting: Internal Medicine

## 2014-06-23 VITALS — BP 120/80 | HR 76 | Temp 98.6°F | Resp 12 | Wt 323.6 lb

## 2014-06-23 DIAGNOSIS — E890 Postprocedural hypothyroidism: Secondary | ICD-10-CM

## 2014-06-23 DIAGNOSIS — E119 Type 2 diabetes mellitus without complications: Secondary | ICD-10-CM

## 2014-06-23 MED ORDER — INSULIN GLARGINE 300 UNIT/ML ~~LOC~~ SOPN: 50.0000 [IU] | PEN_INJECTOR | Freq: Every day | SUBCUTANEOUS | Status: DC

## 2014-06-23 NOTE — Progress Notes (Signed)
Patient ID: Tracy [name], female   DOB: [birth year], 48 y.o.   MRN: [id number]  HPI: Tracy [name] is a 48 y.o.-year-old female, returning for f/u for DM2, dx in 06/01/2013, uncontrolled, without complications and  hypothyroidism. Last visit 2.5 mo ago.  She called multiple times since last visit with high sugars >> we kept adjusting the insulin doses.  She was in the ED 2x since last visit >> once for hyperglycemia 06/06/2014 >> given insulin and hydrated >> sent home.  She woke up on 06/17/2014 with CP radiating to neck and head >> went to the ED >> EKGs, Echo, troponins. She was dx'ed with pericarditis >> colchicine + ibuprofen >> better sugars but still has CPs.   DM2: Last hemoglobin A1c was Lab Results  Component Value Date   HGBA1C 8.3* 05/05/2014   HGBA1C 7.4* 01/20/2014   HGBA1C 5.7 07/04/2011   (06/04/2013) HbA1c 11.4%.  Pt is on a regimen of: - Metformin XR 1000 bid. She did not try the immediate release. - Januvia 100 mg daily in am. - Glipizide 5 mg bid - added 02/2014. - Lantus 50 units at bedtime. - NovoLog 18 units tid  Pt was checking  her sugars 2x a day, not lately. No recent checks, but they improved after starting the Glipizide. Sugars: - before meals: 100-110 >> 160-170s - bedtime: 132-180, 227x1 No lows. Lowest 91 at bedtime (skipped supper).? Level of hypoglycemia awareness Uses a One Touch Verio IQ meter.  She went to the diabetes class, [facility].  - no CKD, last BUN/creatinine:  Lab Results  Component Value Date   BUN 10 06/06/2014   CREATININE 0.73 06/06/2014  - last eye exam was in 04/2014. No DR. She has blurry vision on and off. - no numbness and tingling in her feet.  Hypothyroidism: - she is on Levothyroxine 300 mcg in am  - She was staking Zantac 2x a day - separated from Levothyroxine - now stopped  Reviewed TFTs: Lab Results  Component Value Date   TSH 8.52* 05/05/2014   TSH 17.07* 02/25/2014   TSH  3.00 01/20/2014   TSH 3.47 01/03/2012   TSH 10.25* 07/04/2011   TSH 1.10 02/08/2010   TSH 0.38 02/16/2009   TSH 0.27* 10/23/2007   FREET4 0.85 05/05/2014   FREET4 0.29* 02/25/2014   FREET4 0.91 01/20/2014   She c/o: - still CP - poor sleep - dry skin - diarrhea - nocturia - itching - Low libido  ROS: see HPI+ Constitutional: + weight gain,  no subjective hyperthermia Eyes:+ blurry vision, no xerophthalmia ENT: no sore throat, no nodules palpated in throat, no dysphagia/odynophagia, no hoarseness Cardiovascular: no CP/SOB/palpitations/leg swelling Respiratory: no cough/SOB Gastrointestinal: no N/V/+D/no C/+ heartburn Musculoskeletal: no muscle aches/no joint aches Skin: no rashes, + itching Neurological: no tremors/numbness/tingling/dizziness Low libido.  I reviewed pt's medications, allergies, PMH, social hx, family hx and no changes required, except as mentioned above.  Patient mentioned papillary thyroid cancer in her PMH, however, per review of the thyroid pathology after thyroidectomy, this was benign  PE: BP 120/80 mmHg  Pulse 76  Temp(Src) 98.6 F (37 C) (Oral)  Resp 12  Wt 323 lb 9.6 oz (146.784 kg)  SpO2 96%  LMP 05/05/2014 Wt Readings from Last 3 Encounters:  06/23/14 323 lb 9.6 oz (146.784 kg)  05/05/14 334 lb (151.501 kg)  04/20/14 335 lb (151.955 kg)   Constitutional: obese Eyes: PERRLA, EOMI, no exophthalmos ENT: moist mucous membranes, no thyromegaly, no cervical lymphadenopathy  Cardiovascular: RRR, No MRG Respiratory: CTA B Gastrointestinal: abdomen soft, NT, ND, BS+ Musculoskeletal: no deformities, strength intact in all 4 Skin: moist, warm, no rashes Neurological: no tremor with outstretched hands, DTR normal in all 4  ASSESSMENT: 1. DM2, uncontrolled, without complications  2. Hypothyroidism - post-surgical  PLAN:  1. Patient with uncontrolled DM2, on oral antidiabetic regimen, with greatly increased sugars before her pericarditis  episode >> improved when adding Colchicine! And after adding basal-bolus regimen. We can stop Glipizide now as she takes mealtime insulin. - I suggested to:  Patient Instructions  Please continue: - Metformin XR 1000 2x a day. - Januvia 100 mg daily in am. - NovoLog 18 units 3x a day before meals.  Stop Lantus and start Toujeo 50 units at bedtime.  Stop Glipizide.  Please return in 1 month with your sugar log.   Please stop at [facility] lab downstairs.   - restart checking sugars at different times of the day - check 3 times a day, rotating checks - advised for yearly eye exams >> she is up to date. Has blurry vision. - had PNA and flu vaccine this season - will check a C peptide to check her pancreatic insulin reserve - per her request. - Return to clinic in 1 mo with sugar log   2. Hypothyroidism - post-surgical - at previous visits, we separated the Levothyroxine from the Ranitidine and decrease the dose to 200 mcg daily >> next TFTs were normal. However, she then restarted the Ranitidine >> TSH raised and she felt poorly >> asked me to allow her to increase the dose of LT4 back to 300 mcg >> we started this dose. Since then, she stopped the Ranitidine. - will recheck TSH and fT4 today   Patient Instructions   Continue 300 mcg of Levothyroxine for now.   Please stop at [facility] lab downstairs.   Orders Only on 06/23/2014  Component Date Value Ref Range Status  . TSH 06/23/2014 3.122  0.350 - 4.500 uIU/mL Final  . Free T4 06/23/2014 1.22  0.80 - 1.80 ng/dL Final  . C-Peptide 06/23/2014 8.13* 0.80 - 3.90 ng/mL Final   TSH normal >> continue 300 mcg daily LT4. C peptide high >> good pancreatic reserve.

## 2014-06-23 NOTE — Patient Instructions (Signed)
Please continue: - Metformin XR 1000 2x a day. - Januvia 100 mg daily in am. - NovoLog 18 units 3x a day before meals.  Stop Lantus and start Toujeo 50 units at bedtime.  Stop Glipizide.  Please return in 1 month with your sugar log.   Continue 300 mcg of Levothyroxine for now.  Please stop at [facility] lab downstairs.

## 2014-06-24 ENCOUNTER — Encounter: Payer: Self-pay | Admitting: Internal Medicine

## 2014-06-24 ENCOUNTER — Other Ambulatory Visit (HOSPITAL_COMMUNITY): Payer: 59

## 2014-06-24 ENCOUNTER — Other Ambulatory Visit: Payer: Self-pay | Admitting: *Deleted

## 2014-06-24 LAB — C-PEPTIDE: C-Peptide: 8.13 ng/mL — ABNORMAL HIGH (ref 0.80–3.90)

## 2014-06-24 LAB — T4, FREE: FREE T4: 1.22 ng/dL (ref 0.80–1.80)

## 2014-06-24 LAB — TSH: TSH: 3.122 u[IU]/mL (ref 0.350–4.500)

## 2014-06-24 MED ORDER — GLUCOSE BLOOD VI STRP: ORAL_STRIP | Status: DC

## 2014-06-27 ENCOUNTER — Encounter: Payer: Self-pay | Admitting: Cardiovascular Disease

## 2014-06-27 ENCOUNTER — Other Ambulatory Visit (HOSPITAL_COMMUNITY): Payer: 59

## 2014-06-27 ENCOUNTER — Ambulatory Visit (INDEPENDENT_AMBULATORY_CARE_PROVIDER_SITE_OTHER): Payer: 59 | Admitting: Cardiovascular Disease

## 2014-06-27 VITALS — BP 134/80 | HR 78 | Ht 71.0 in | Wt 325.8 lb

## 2014-06-27 DIAGNOSIS — R079 Chest pain, unspecified: Secondary | ICD-10-CM | POA: Insufficient documentation

## 2014-06-27 DIAGNOSIS — G4733 Obstructive sleep apnea (adult) (pediatric): Secondary | ICD-10-CM

## 2014-06-27 DIAGNOSIS — R072 Precordial pain: Secondary | ICD-10-CM

## 2014-06-27 DIAGNOSIS — E7800 Pure hypercholesterolemia, unspecified: Secondary | ICD-10-CM

## 2014-06-27 DIAGNOSIS — I10 Essential (primary) hypertension: Secondary | ICD-10-CM

## 2014-06-27 DIAGNOSIS — I309 Acute pericarditis, unspecified: Secondary | ICD-10-CM

## 2014-06-27 DIAGNOSIS — G4730 Sleep apnea, unspecified: Secondary | ICD-10-CM

## 2014-06-27 HISTORY — DX: Obstructive sleep apnea (adult) (pediatric): G47.33

## 2014-06-27 NOTE — Progress Notes (Signed)
Patient ID: Tracy [name], female   DOB: 1965/12/10, 48 y.o.   MRN: [id number]    48 yo referred by Dr Nancy Fetter for recurrent chest pain adn diagnosis 11/15 in ER of pericarditis.  She has atypical sharp non exertional right and left sided chest pain Not pleuritic Not always exertional has had a few months.  In ER  CXR normal troponin negative  Rx with colchicine and NSAI's  DM is poorly controlled A1c over 8.  She has significant anxiety and depression.  She has had both inpatient and ongoing outpatient Rx for this.  SSCP atypical for months worse last few weeks Not positional Can be pleuritic Variabel relief with ASA/Tylenol/NSAI and colchicine  Seen in [place] ER because pain intensified 11/15   Admitted Despite negative CRF, ESR, normal ECG and normal echo she was labelled as having pericarditis and Rx with motrin 800 tid and colchicine bid   Husband also concerned that she snores and has sleep apnea    ROS: Denies fever, malais, weight loss, blurry vision, decreased visual acuity, cough, sputum, SOB, hemoptysis, pleuritic pain, palpitaitons, heartburn, abdominal pain, melena, lower extremity edema, claudication, or rash.  All other systems reviewed and negative   General: Affect appropriate PCOS with some hirtsutism  Overweight white female  HEENT: normal Neck supple with no adenopathy JVP normal no bruits no thyromegaly Lungs clear with no wheezing and good diaphragmatic motion Heart:  S1/S2 no murmur,rub, gallop or click PMI normal Abdomen: benighn, BS positve, no tenderness, no AAA no bruit.  No HSM or HJR Distal pulses intact with no bruits No edema Neuro non-focal Skin warm and dry No muscular weakness  Medications Current Outpatient Prescriptions  Medication Sig Dispense Refill  . colchicine 0.6 MG tablet Take 0.6 mg by mouth 2 (two) times daily.    . DULoxetine (CYMBALTA) 60 MG capsule Take 60 mg by mouth every morning.     Marland Kitchen glucose blood (ONETOUCH VERIO)  test strip Use to test blood sugar 3 times daily as instructed. 300 each 3  . ibuprofen (ADVIL,MOTRIN) 200 MG tablet Take 800 mg by mouth once as needed for moderate pain (muscle aches.).    Marland Kitchen insulin aspart (NOVOLOG) 100 UNIT/ML FlexPen Inject 8 Units into the skin 3 (three) times daily with meals. (Patient taking differently: Inject 18 Units into the skin 3 (three) times daily with meals. ) 15 mL 2  . insulin glargine (LANTUS) 100 UNIT/ML injection Inject 16 Units into the skin daily.    . Insulin Pen Needle 32G X 4 MM MISC Use to inject insulin 3 times daily as instructed. 100 each 5  . Insulin Syringe-Needle U-100 (INSULIN SYRINGE .5CC/31GX5/16") 31G X 5/16" 0.5 ML MISC Use to inject insulin 1 time daily as instructed. 100 each 2  . L-Methylfolate-Algae (DEPLIN 15) 15-90.314 MG CAPS Take 1 capsule by mouth daily.    Marland Kitchen levothyroxine (SYNTHROID, LEVOTHROID) 200 MCG tablet Take 300 mcg by mouth daily before breakfast.     . lisinopril-hydrochlorothiazide (PRINZIDE,ZESTORETIC) 10-12.5 MG per tablet Take 1 tablet by mouth every morning.     . metFORMIN (GLUCOPHAGE-XR) 500 MG 24 hr tablet TAKE 2 TABLETS BY MOUTH 2 (TWO) TIMES DAILY WITH A MEAL. 120 tablet 3  . omeprazole (PRILOSEC) 40 MG capsule Take 40 mg by mouth daily.    . pantoprazole (PROTONIX) 20 MG tablet Take 20 mg by mouth daily as needed.    . ranitidine (ZANTAC) 150 MG tablet Take 150 mg by mouth daily as  needed for heartburn (heartburn).    . sitaGLIPtin (JANUVIA) 100 MG tablet Take 100 mg by mouth daily.    . Vortioxetine HBr (BRINTELLIX) 10 MG TABS Take 10 mg by mouth daily.     No current facility-administered medications for this visit.    Allergies Sulfa antibiotics; Bupropion; Bupropion hcl; Codeine; and Sulfamethoxazole-trimethoprim  Family History: Family History  Problem Relation Age of Onset  . Breast cancer Mother   . Hypertension Mother   . Heart disease Father   . Depression Father   . Cancer Brother     THROAT    . Depression Brother   . Anxiety disorder Brother   . Depression Sister     Social History: History   Social History  . Marital Status: Married    Spouse Name: N/A    Number of Children: N/A  . Years of Education: N/A   Occupational History  . Nurse    Social History Main Topics  . Smoking status: Former Smoker -- 0.50 packs/day for 11 years    Types: Cigarettes    Quit date: 08/05/2013  . Smokeless tobacco: Never Used  . Alcohol Use: No  . Drug Use: No  . Sexual Activity: No   Other Topics Concern  . Not on file   Social History Narrative   RN -    Regular exercise: no   Caffeine use: 2 x daily    Past Surgical History  Procedure Laterality Date  . Appendectomy    . Pilonidal cyst excision  1994  . Total thyroidectomy  2003  . Ovarian cyst surgery Left 1998  . Lumbar laminectomy      X 3  . Achilles tendon surgery Left 2007  . Excision haglund's deformity with achilles tendon repair Right 02/25/2013    Procedure: RIGHT ACHILLES DEBRIDEMENT AND RECONSTRUCTION;  HAGLUND'S EXCISION; GASTROC RECESSION AND FLEXOR HALLUCIS LONGUS TRANSFER;  Surgeon: Wylene Simmer, MD;  Location: [place];  Service: Orthopedics;  Laterality: Right;  . Tubal ligation Right 1984  . Esophagogastroduodenoscopy (egd) with propofol N/A 04/20/2014    Procedure: ESOPHAGOGASTRODUODENOSCOPY (EGD) WITH PROPOFOL;  Surgeon: Arta Silence, MD;  Location: [facility];  Service: Endoscopy;  Laterality: N/A;  . Colonoscopy with propofol N/A 04/20/2014    Procedure: COLONOSCOPY WITH PROPOFOL;  Surgeon: Arta Silence, MD;  Location: [facility];  Service: Endoscopy;  Laterality: N/A;    Past Medical History  Diagnosis Date  . PCOS (polycystic ovarian syndrome)   . History of migraine headaches   . Hypothyroidism   . GERD (gastroesophageal reflux disease)   . History of kidney stones   . Arthritis     back, knees, right elbow  . Anxiety   . High cholesterol     no  current med.  . Hypertension     under control with meds., has been on med. x 4 yr.  . Depression   . Achilles tendonitis 02/2013    right  . Acquired Haglund's deformity of right heel 02/2013  . 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 Insulin resistance     does not check blood sugar at home  . History of electroconvulsive therapy   . Dental crowns present   . Diabetes mellitus without complication   . PONV (postoperative nausea and vomiting)     also hx. of emergence delirium 2007    Electrocardiogram:  SR rate 86 no signs of pericarditis   Assessment and Plan

## 2014-06-27 NOTE — Assessment & Plan Note (Signed)
Body habitus and husbands description support diagnosis  Refer to Pulmonary for sleep study and Rx with CPAP

## 2014-06-27 NOTE — Patient Instructions (Addendum)
Your physician recommends that you schedule a follow-up appointment in: AS  NEEDED  You have been referred to [place] Your physician recommends that you continue on your current medications as directed. Please refer to the Current Medication list given to you today.  Your physician has requested that you have a cardiac MRI. Cardiac MRI uses a computer to create images of your heart as its beating, producing both still and moving pictures of your heart and major blood vessels. For further information please visit [email or web address] Please follow the instruction sheet given to you today for more information.   Your physician recommends that you return for lab work in:  TODAY   SED RATE   CRP

## 2014-06-27 NOTE — Assessment & Plan Note (Signed)
Cholesterol is at goal.  Continue current dose of statin and diet Rx.  No myalgias or side effects.  F/U  LFT's in 6 months. No results found for: LDLCALC           

## 2014-06-27 NOTE — Assessment & Plan Note (Signed)
Pain clearly related to anxiety and worse last week.  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  Cardiac MRI to assess pericardium  F/U primary pain clinic and psychiatrist

## 2014-06-27 NOTE — Assessment & Plan Note (Signed)
Well controlled.  Continue current medications and low sodium Dash type diet.    

## 2014-06-28 ENCOUNTER — Ambulatory Visit (INDEPENDENT_AMBULATORY_CARE_PROVIDER_SITE_OTHER): Payer: 59 | Admitting: Pulmonary Disease

## 2014-06-28 ENCOUNTER — Other Ambulatory Visit (HOSPITAL_COMMUNITY): Payer: 59

## 2014-06-28 ENCOUNTER — Encounter: Payer: Self-pay | Admitting: Pulmonary Disease

## 2014-06-28 VITALS — BP 118/80 | HR 93 | Ht 71.0 in | Wt 330.4 lb

## 2014-06-28 DIAGNOSIS — R0683 Snoring: Secondary | ICD-10-CM

## 2014-06-28 LAB — C-REACTIVE PROTEIN: CRP: <0.5 mg/dL (ref 0.5–20.0)

## 2014-06-28 LAB — SEDIMENTATION RATE: Sed Rate: 17 mm/hr (ref 0–22)

## 2014-06-28 NOTE — Progress Notes (Signed)
Chief Complaint  Patient presents with  . SLEEP CONSULT    Referred by Dr Johnsie Cancel. Epworth Score: 13    History of Present Illness: Tracy [name] is a 48 y.o. female for evaluation of sleep problems.  Her husband has been worried about her snoring, and breathing while asleep.  She will stop breathing and wake up with a gasp.  She will also scream and cry while asleep.  Her mouth gets dry at night, and she can't sleep on her back.    She was in hospital recently to assess for chest pain.  During her hospital stay she was noted to have intermittent oxygen desaturation while asleep.  She goes to sleep at 11 pm.  She falls asleep after 30 minutes.  She wakes up 2 to 3 times to use the bathroom.  She gets out of bed at 8 am.  She feels tired, and sleepy in the morning.  She denies morning headache.  She does not use anything to help her fall sleep.  She will drink soda several times per day to help stay awake.  She can fall asleep easily while watching TV or working on the computer.  She denies sleep walking, sleep talking, bruxism, or nightmares.  There is no history of restless legs.  She denies sleep hallucinations, sleep paralysis, or cataplexy.  The Epworth score is 13 out of 24.  Tests:  PMHx >> Migraine headaches, Hypothyroidism, GERD, HTN, HLD, Anxiety, Depression, DM, Neuropathy PCOS  Tracy [name]  has past surgical history that includes Appendectomy; Pilonidal cyst excision (1994); Total thyroidectomy (2003); Ovarian cyst surgery (Left, 1998); Lumbar laminectomy; Achilles tendon surgery (Left, 2007); Excision haglund's deformity with achilles tendon repair (Right, 02/25/2013); Tubal ligation (Right, 1984); Esophagogastroduodenoscopy (egd) with propofol (N/A, 04/20/2014); and Colonoscopy with propofol (N/A, 04/20/2014).  Prior to Admission medications   Medication Sig Start Date End Date Taking? Authorizing Provider  colchicine 0.6 MG tablet Take 0.6 mg by mouth 2  (two) times daily.   Yes Historical Provider, MD  DULoxetine (CYMBALTA) 60 MG capsule Take 60 mg by mouth every morning.    Yes Historical Provider, MD  glucose blood (ONETOUCH VERIO) test strip Use to test blood sugar 3 times daily as instructed. 06/24/14  Yes Philemon Kingdom, MD  ibuprofen (ADVIL,MOTRIN) 200 MG tablet Take 800 mg by mouth once as needed for moderate pain (muscle aches.).   Yes Historical Provider, MD  insulin aspart (NOVOLOG) 100 UNIT/ML FlexPen Inject 8 Units into the skin 3 (three) times daily with meals. Patient taking differently: Inject 18 Units into the skin 3 (three) times daily with meals.  06/07/14  Yes Philemon Kingdom, MD  Insulin Glargine (TOUJEO SOLOSTAR) 300 UNIT/ML SOPN Inject 30 Units into the skin daily at 10 pm.   Yes Historical Provider, MD  Insulin Pen Needle 32G X 4 MM MISC Use to inject insulin 3 times daily as instructed. 06/07/14  Yes Philemon Kingdom, MD  Insulin Syringe-Needle U-100 (INSULIN SYRINGE .5CC/31GX5/16") 31G X 5/16" 0.5 ML MISC Use to inject insulin 1 time daily as instructed. 06/08/14  Yes Philemon Kingdom, MD  L-Methylfolate-Algae (DEPLIN 15) 15-90.314 MG CAPS Take 1 capsule by mouth daily.   Yes Historical Provider, MD  levothyroxine (SYNTHROID, LEVOTHROID) 200 MCG tablet Take 300 mcg by mouth daily before breakfast.    Yes Historical Provider, MD  lisinopril-hydrochlorothiazide (PRINZIDE,ZESTORETIC) 10-12.5 MG per tablet Take 1 tablet by mouth every morning.    Yes Historical Provider, MD  metFORMIN (GLUCOPHAGE-XR) 500 MG  24 hr tablet TAKE 2 TABLETS BY MOUTH 2 (TWO) TIMES DAILY WITH A MEAL. 04/15/14  Yes Philemon Kingdom, MD  omeprazole (PRILOSEC) 40 MG capsule Take 40 mg by mouth daily.   Yes Historical Provider, MD  pantoprazole (PROTONIX) 20 MG tablet Take 20 mg by mouth daily as needed. 06/18/14 06/18/15 Yes Historical Provider, MD  ranitidine (ZANTAC) 150 MG tablet Take 150 mg by mouth daily as needed for heartburn (heartburn).   Yes  Historical Provider, MD  sitaGLIPtin (JANUVIA) 100 MG tablet Take 100 mg by mouth daily.   Yes Historical Provider, MD  Vortioxetine HBr (BRINTELLIX) 10 MG TABS Take 10 mg by mouth daily.   Yes Historical Provider, MD    Allergies  Allergen Reactions  . Sulfa Antibiotics Itching, Swelling and Rash  . Bupropion Rash  . Bupropion Hcl Itching and Rash  . Codeine Itching  . Sulfamethoxazole-Trimethoprim Rash    Her family history includes Anxiety disorder in her brother; Breast cancer in her mother; Cancer in her brother; Depression in her brother, father, and sister; Heart disease in her father; Hypertension in her mother.  She  reports that she quit smoking about 10 months ago. Her smoking use included Cigarettes. She has a 5.5 pack-year smoking history. She has never used smokeless tobacco. She reports that she does not drink alcohol or use illicit drugs.  Review of Systems  Constitutional: Negative for fever and unexpected weight change.  HENT: Negative for congestion, dental problem, ear pain, nosebleeds, postnasal drip, rhinorrhea, sinus pressure, sneezing, sore throat and trouble swallowing.   Eyes: Negative for redness and itching.  Respiratory: Positive for shortness of breath. Negative for cough, chest tightness and wheezing.   Cardiovascular: Positive for chest pain. Negative for palpitations and leg swelling.  Gastrointestinal: Negative for nausea and vomiting.       Heartburn//indigestion  Genitourinary: Negative for dysuria.  Musculoskeletal: Positive for joint swelling and arthralgias.  Skin: Negative for rash.  Neurological: Positive for headaches.  Hematological: Does not bruise/bleed easily.  Psychiatric/Behavioral: Positive for dysphoric mood. The patient is nervous/anxious.    Physical Exam:  General - No distress ENT - No sinus tenderness, no oral exudate, no LAN, no thyromegaly, TM clear, pupils equal/reactive, MP 3 Cardiac - s1s2 regular, no murmur, pulses  symmetric Chest - No wheeze/rales/dullness, good air entry, normal respiratory excursion Back - No focal tenderness Abd - Soft, non-tender, no organomegaly, + bowel sounds Ext - No edema Neuro - Normal strength, cranial nerves intact Skin - No rashes Psych - Normal mood, and behavior  Impression:  Snoring. She has sleep disruption, witnessed apnea, snoring, and daytime sleepiness.  She has hx of HTN, and DM.  Her BMI is > 35.  I am concerned she could have sleep apnea.  We discussed how sleep apnea can affect various health problems including risks for hypertension, cardiovascular disease, and diabetes.  We also discussed how sleep disruption can increase risks for accident, such as while driving.  Weight loss as a means of improving sleep apnea was also reviewed.  Additional treatment options discussed were CPAP therapy, oral appliance, and surgical intervention. Plan: - will arrange for home sleep study pending insurance approval  Chesley Mires, M.D. Pager [phone]

## 2014-06-28 NOTE — Progress Notes (Deleted)
   Subjective:    Patient ID: Tracy [name], female    DOB: Jun 13, 1966, 48 y.o.   MRN: [id number]  HPI    Review of Systems  Constitutional: Negative for fever and unexpected weight change.  HENT: Negative for congestion, dental problem, ear pain, nosebleeds, postnasal drip, rhinorrhea, sinus pressure, sneezing, sore throat and trouble swallowing.   Eyes: Negative for redness and itching.  Respiratory: Positive for shortness of breath. Negative for cough, chest tightness and wheezing.   Cardiovascular: Positive for chest pain. Negative for palpitations and leg swelling.  Gastrointestinal: Negative for nausea and vomiting.       Heartburn//indigestion  Genitourinary: Negative for dysuria.  Musculoskeletal: Positive for joint swelling and arthralgias.  Skin: Negative for rash.  Neurological: Positive for headaches.  Hematological: Does not bruise/bleed easily.  Psychiatric/Behavioral: Positive for dysphoric mood. The patient is nervous/anxious.        Objective:   Physical Exam        Assessment & Plan:

## 2014-06-28 NOTE — Patient Instructions (Signed)
Will arrange for home sleep study Will call to arrange for follow up after sleep study reviewed  

## 2014-06-29 ENCOUNTER — Other Ambulatory Visit (HOSPITAL_COMMUNITY): Payer: 59

## 2014-07-01 ENCOUNTER — Other Ambulatory Visit (HOSPITAL_COMMUNITY): Payer: 59

## 2014-07-04 ENCOUNTER — Other Ambulatory Visit (HOSPITAL_COMMUNITY): Payer: 59

## 2014-07-05 ENCOUNTER — Other Ambulatory Visit (HOSPITAL_COMMUNITY): Payer: 59

## 2014-07-06 ENCOUNTER — Other Ambulatory Visit (HOSPITAL_COMMUNITY): Payer: 59

## 2014-07-07 ENCOUNTER — Other Ambulatory Visit (HOSPITAL_COMMUNITY): Payer: 59

## 2014-07-07 ENCOUNTER — Other Ambulatory Visit: Payer: Self-pay | Admitting: *Deleted

## 2014-07-07 DIAGNOSIS — I319 Disease of pericardium, unspecified: Secondary | ICD-10-CM

## 2014-07-08 ENCOUNTER — Other Ambulatory Visit (HOSPITAL_COMMUNITY): Payer: 59

## 2014-07-11 ENCOUNTER — Other Ambulatory Visit (HOSPITAL_COMMUNITY): Payer: 59

## 2014-07-11 ENCOUNTER — Telehealth: Payer: Self-pay | Admitting: *Deleted

## 2014-07-11 NOTE — Telephone Encounter (Signed)
PT  AWARE  OFFICE WILL CALL  TO  SET UP  ECHO  INSTEAD  OF DOING  MRI  PT  WEIGHS  TO MUCH  TO  FIT  IN MRI EQUIPMENT./CY

## 2014-07-13 ENCOUNTER — Telehealth: Payer: Self-pay | Admitting: Cardiovascular Disease

## 2014-07-13 NOTE — Telephone Encounter (Signed)
MESSAGE  FORWARDED TO [facility]  TO  MAKE  ECHO APPT .Tracy [name]

## 2014-07-13 NOTE — Telephone Encounter (Signed)
New problem   Pt stated she is returning a call to sched procedure, stated no one left a name.

## 2014-07-14 ENCOUNTER — Other Ambulatory Visit: Payer: Self-pay | Admitting: *Deleted

## 2014-07-14 MED ORDER — INSULIN ASPART 100 UNIT/ML FLEXPEN: 18.0000 [IU] | PEN_INJECTOR | Freq: Three times a day (TID) | SUBCUTANEOUS | Status: DC

## 2014-07-19 ENCOUNTER — Ambulatory Visit (HOSPITAL_COMMUNITY): Payer: 59 | Attending: Cardiology | Admitting: Cardiology

## 2014-07-19 DIAGNOSIS — I319 Disease of pericardium, unspecified: Secondary | ICD-10-CM | POA: Insufficient documentation

## 2014-07-19 NOTE — Progress Notes (Signed)
Echo performed. 

## 2014-07-25 ENCOUNTER — Ambulatory Visit: Payer: Self-pay | Admitting: Internal Medicine

## 2014-08-01 ENCOUNTER — Ambulatory Visit: Payer: Self-pay | Admitting: Internal Medicine

## 2014-08-11 ENCOUNTER — Other Ambulatory Visit (HOSPITAL_COMMUNITY): Payer: Self-pay | Admitting: Gastroenterology

## 2014-08-11 DIAGNOSIS — K760 Fatty (change of) liver, not elsewhere classified: Secondary | ICD-10-CM

## 2014-08-18 ENCOUNTER — Emergency Department (HOSPITAL_COMMUNITY)
Admission: EM | Admit: 2014-08-18 | Discharge: 2014-08-18 | Disposition: A | Discharge status: Home / Self Care | Payer: 59 | Attending: Emergency Medicine | Admitting: Emergency Medicine

## 2014-08-18 ENCOUNTER — Encounter (HOSPITAL_COMMUNITY): Payer: Self-pay | Admitting: Emergency Medicine

## 2014-08-18 ENCOUNTER — Emergency Department (HOSPITAL_COMMUNITY): Payer: 59

## 2014-08-18 ENCOUNTER — Ambulatory Visit: Payer: Self-pay | Admitting: Internal Medicine

## 2014-08-18 DIAGNOSIS — E282 Polycystic ovarian syndrome: Secondary | ICD-10-CM | POA: Insufficient documentation

## 2014-08-18 DIAGNOSIS — F329 Major depressive disorder, single episode, unspecified: Secondary | ICD-10-CM | POA: Diagnosis not present

## 2014-08-18 DIAGNOSIS — Z794 Long term (current) use of insulin: Secondary | ICD-10-CM | POA: Insufficient documentation

## 2014-08-18 DIAGNOSIS — Z87442 Personal history of urinary calculi: Secondary | ICD-10-CM | POA: Diagnosis not present

## 2014-08-18 DIAGNOSIS — Z8679 Personal history of other diseases of the circulatory system: Secondary | ICD-10-CM | POA: Insufficient documentation

## 2014-08-18 DIAGNOSIS — R05 Cough: Secondary | ICD-10-CM | POA: Diagnosis present

## 2014-08-18 DIAGNOSIS — E039 Hypothyroidism, unspecified: Secondary | ICD-10-CM | POA: Insufficient documentation

## 2014-08-18 DIAGNOSIS — F419 Anxiety disorder, unspecified: Secondary | ICD-10-CM | POA: Insufficient documentation

## 2014-08-18 DIAGNOSIS — E860 Dehydration: Secondary | ICD-10-CM

## 2014-08-18 DIAGNOSIS — E119 Type 2 diabetes mellitus without complications: Secondary | ICD-10-CM | POA: Diagnosis not present

## 2014-08-18 DIAGNOSIS — K219 Gastro-esophageal reflux disease without esophagitis: Secondary | ICD-10-CM | POA: Diagnosis not present

## 2014-08-18 DIAGNOSIS — M1990 Unspecified osteoarthritis, unspecified site: Secondary | ICD-10-CM | POA: Diagnosis not present

## 2014-08-18 DIAGNOSIS — R059 Cough, unspecified: Secondary | ICD-10-CM

## 2014-08-18 DIAGNOSIS — R112 Nausea with vomiting, unspecified: Secondary | ICD-10-CM

## 2014-08-18 DIAGNOSIS — Z98811 Dental restoration status: Secondary | ICD-10-CM | POA: Insufficient documentation

## 2014-08-18 DIAGNOSIS — J209 Acute bronchitis, unspecified: Secondary | ICD-10-CM

## 2014-08-18 DIAGNOSIS — Z87891 Personal history of nicotine dependence: Secondary | ICD-10-CM | POA: Insufficient documentation

## 2014-08-18 DIAGNOSIS — Z79899 Other long term (current) drug therapy: Secondary | ICD-10-CM | POA: Diagnosis not present

## 2014-08-18 DIAGNOSIS — I10 Essential (primary) hypertension: Secondary | ICD-10-CM | POA: Diagnosis not present

## 2014-08-18 LAB — CBC WITH DIFFERENTIAL/PLATELET
BASOS ABS: 0 10*3/uL (ref 0.0–0.1)
Basophils Relative: 0 % (ref 0–1)
EOS ABS: 0.2 10*3/uL (ref 0.0–0.7)
Eosinophils Relative: 3 % (ref 0–5)
HCT: 40 % (ref 36.0–46.0)
Hemoglobin: 13.7 g/dL (ref 12.0–15.0)
LYMPHS ABS: 1.9 10*3/uL (ref 0.7–4.0)
Lymphocytes Relative: 30 % (ref 12–46)
MCH: 28.9 pg (ref 26.0–34.0)
MCHC: 34.3 g/dL (ref 30.0–36.0)
MCV: 84.4 fL (ref 78.0–100.0)
Monocytes Absolute: 0.5 10*3/uL (ref 0.1–1.0)
Monocytes Relative: 8 % (ref 3–12)
NEUTROS ABS: 3.6 10*3/uL (ref 1.7–7.7)
Neutrophils Relative %: 59 % (ref 43–77)
PLATELETS: 219 10*3/uL (ref 150–400)
RBC: 4.74 MIL/uL (ref 3.87–5.11)
RDW: 17.4 % — ABNORMAL HIGH (ref 11.5–15.5)
WBC: 6.2 10*3/uL (ref 4.0–10.5)

## 2014-08-18 LAB — HEPATIC FUNCTION PANEL
ALBUMIN: 3.5 g/dL (ref 3.5–5.2)
ALK PHOS: 83 U/L (ref 39–117)
ALT: 57 U/L — ABNORMAL HIGH (ref 0–35)
AST: 50 U/L — ABNORMAL HIGH (ref 0–37)
BILIRUBIN DIRECT: 0.1 mg/dL (ref 0.0–0.3)
BILIRUBIN TOTAL: 0.6 mg/dL (ref 0.3–1.2)
Indirect Bilirubin: 0.5 mg/dL (ref 0.3–0.9)
TOTAL PROTEIN: 7.5 g/dL (ref 6.0–8.3)

## 2014-08-18 LAB — BASIC METABOLIC PANEL
Anion gap: 8 (ref 5–15)
BUN: 10 mg/dL (ref 6–23)
CALCIUM: 9.2 mg/dL (ref 8.4–10.5)
CHLORIDE: 105 meq/L (ref 96–112)
CO2: 25 mmol/L (ref 19–32)
CREATININE: 0.77 mg/dL (ref 0.50–1.10)
GLUCOSE: 189 mg/dL — AB (ref 70–99)
Potassium: 3.9 mmol/L (ref 3.5–5.1)
Sodium: 138 mmol/L (ref 135–145)

## 2014-08-18 LAB — I-STAT CG4 LACTIC ACID, ED: LACTIC ACID, VENOUS: 1.53 mmol/L (ref 0.5–2.2)

## 2014-08-18 MED ORDER — PROMETHAZINE HCL 25 MG PO TABS: 25.0000 mg | ORAL_TABLET | Freq: Four times a day (QID) | ORAL | Status: DC | PRN

## 2014-08-18 MED ORDER — ALBUTEROL SULFATE (2.5 MG/3ML) 0.083% IN NEBU
5.0000 mg | INHALATION_SOLUTION | Freq: Once | RESPIRATORY_TRACT | Status: AC
  Administered 2014-08-18: 5 mg via RESPIRATORY_TRACT
  Filled 2014-08-18: qty 6

## 2014-08-18 MED ORDER — MORPHINE SULFATE 4 MG/ML IJ SOLN
4.0000 mg | Freq: Once | INTRAMUSCULAR | Status: AC
  Administered 2014-08-18: 4 mg via INTRAVENOUS
  Filled 2014-08-18: qty 1

## 2014-08-18 MED ORDER — SODIUM CHLORIDE 0.9 % IV BOLUS (SEPSIS)
1000.0000 mL | Freq: Once | INTRAVENOUS | Status: AC
  Administered 2014-08-18: 1000 mL via INTRAVENOUS

## 2014-08-18 MED ORDER — ONDANSETRON HCL 4 MG/2ML IJ SOLN
4.0000 mg | Freq: Once | INTRAMUSCULAR | Status: DC
  Filled 2014-08-18: qty 2

## 2014-08-18 NOTE — Discharge Instructions (Signed)
Continue to take your Levaquin, use your albuterol and Spiriva inhalers.  Norco will help with the pain and also suppress the cough.  Push fluids: take small frequent sips of water or Gatorade, do not drink any soda, juice or caffeinated beverages.    Slowly resume solid diet as desired. Avoid food that are spicy, contain dairy and/or have high fat content.  Aviod NSAIDs (aspirin, motrin, ibuprofen, naproxen, Aleve et Ronney Asters) for pain control because they will irritate your stomach.  Please follow with your primary care doctor in the next 2 days for a check-up. They must obtain records for further management.   Do not hesitate to return to the Emergency Department for any new, worsening or concerning symptoms.   Acute Bronchitis Bronchitis is inflammation of the airways that extend from the windpipe into the lungs (bronchi). The inflammation often causes mucus to develop. This leads to a cough, which is the most common symptom of bronchitis.  In acute bronchitis, the condition usually develops suddenly and goes away over time, usually in a couple weeks. Smoking, allergies, and asthma can make bronchitis worse. Repeated episodes of bronchitis may cause further lung problems.  CAUSES Acute bronchitis is most often caused by the same virus that causes a cold. The virus can spread from person to person (contagious) through coughing, sneezing, and touching contaminated objects. SIGNS AND SYMPTOMS   Cough.   Fever.   Coughing up mucus.   Body aches.   Chest congestion.   Chills.   Shortness of breath.   Sore throat.  DIAGNOSIS  Acute bronchitis is usually diagnosed through a physical exam. Your health care provider will also ask you questions about your medical history. Tests, such as chest X-rays, are sometimes done to rule out other conditions.  TREATMENT  Acute bronchitis usually goes away in a couple weeks. Oftentimes, no medical treatment is necessary. Medicines are  sometimes given for relief of fever or cough. Antibiotic medicines are usually not needed but may be prescribed in certain situations. In some cases, an inhaler may be recommended to help reduce shortness of breath and control the cough. A cool mist vaporizer may also be used to help thin bronchial secretions and make it easier to clear the chest.  HOME CARE INSTRUCTIONS  Get plenty of rest.   Drink enough fluids to keep your urine clear or pale yellow (unless you have a medical condition that requires fluid restriction). Increasing fluids may help thin your respiratory secretions (sputum) and reduce chest congestion, and it will prevent dehydration.   Take medicines only as directed by your health care provider.  If you were prescribed an antibiotic medicine, finish it all even if you start to feel better.  Avoid smoking and secondhand smoke. Exposure to cigarette smoke or irritating chemicals will make bronchitis worse. If you are a smoker, consider using nicotine gum or skin patches to help control withdrawal symptoms. Quitting smoking will help your lungs heal faster.   Reduce the chances of another bout of acute bronchitis by washing your hands frequently, avoiding people with cold symptoms, and trying not to touch your hands to your mouth, nose, or eyes.   Keep all follow-up visits as directed by your health care provider.  SEEK MEDICAL CARE IF: Your symptoms do not improve after 1 week of treatment.  SEEK IMMEDIATE MEDICAL CARE IF:  You develop an increased fever or chills.   You have chest pain.   You have severe shortness of breath.  You have bloody  sputum.   You develop dehydration.  You faint or repeatedly feel like you are going to pass out.  You develop repeated vomiting.  You develop a severe headache. MAKE SURE YOU:   Understand these instructions.  Will watch your condition.  Will get help right away if you are not doing well or get worse. Document  Released: 08/29/2004 Document Revised: 12/06/2013 Document Reviewed: 01/12/2013 [facility] Patient Information 2015 [place], [place]. This information is not intended to replace advice given to you by your health care provider. Make sure you discuss any questions you have with your health care provider.  Dehydration, Adult Dehydration means your body does not have as much fluid as it needs. Your kidneys, brain, and heart will not work properly without the right amount of fluids and salt.  HOME CARE  Ask your doctor how to replace body fluid losses (rehydrate).  Drink enough fluids to keep your pee (urine) clear or pale yellow.  Drink small amounts of fluids often if you feel sick to your stomach (nauseous) or throw up (vomit).  Eat like you normally do.  Avoid:  Foods or drinks high in sugar.  Bubbly (carbonated) drinks.  Juice.  Very hot or cold fluids.  Drinks with caffeine.  Fatty, greasy foods.  Alcohol.  Tobacco.  Eating too much.  Gelatin desserts.  Wash your hands to avoid spreading germs (bacteria, viruses).  Only take medicine as told by your doctor.  Keep all doctor visits as told. GET HELP RIGHT AWAY IF:   You cannot drink something without throwing up.  You get worse even with treatment.  Your vomit has blood in it or looks greenish.  Your poop (stool) has blood in it or looks black and tarry.  You have not peed in 6 to 8 hours.  You pee a small amount of very dark pee.  You have a fever.  You pass out (faint).  You have belly (abdominal) pain that gets worse or stays in one spot (localizes).  You have a rash, stiff neck, or bad headache.  You get easily annoyed, sleepy, or are hard to wake up.  You feel weak, dizzy, or very thirsty. MAKE SURE YOU:   Understand these instructions.  Will watch your condition.  Will get help right away if you are not doing well or get worse. Document Released: 05/18/2009 Document Revised: 10/14/2011  Document Reviewed: 03/11/2011 [facility] Patient Information 2015 [place], [place]. This information is not intended to replace advice given to you by your health care provider. Make sure you discuss any questions you have with your health care provider.

## 2014-08-18 NOTE — ED Notes (Signed)
Pt sent here for eval of cough, fever and body aches x several weeks

## 2014-08-18 NOTE — ED Provider Notes (Signed)
CSN: [id number]     Arrival date & time 08/18/14  1425 History   First MD Initiated Contact with Patient 08/18/14 1716     Chief Complaint  Patient presents with  . URI  . Cough     (Consider location/radiation/quality/duration/timing/severity/associated sxs/prior Treatment) HPI   Tracy [name] is a 49 y.o. female with past medical history significant for insulin-dependent diabetes, hypertension, hypothyroid, high cholesterol complaining of  productive cough starting 2 weeks ago associated with lightheaded sensation, shortness of breath, pleuritic chest pain, several episodes of nausea vomiting and Fever tmax 101, multiple episodes of nonbloody, nonbilious, non-coffee ground appearing emesis vstarting 4 days ago.Patient was seen by her primary care physician and diagnosed with a clinical pneumonia yesterday, she was given a shot of Rocephin started on Levaquin (she's had 2 doses) albuterol, Spiriva, Norco for pain and fever control. Patient has gastric varices are being worked up by Dr. Paulita Fujita. Recommends not more than 2000 mg of acetaminophen daily. Patient has been taking Zofran at home with little relief. Asian had flu and pneumococcal vaccine this year. She denies sick contacts.  PCP is Rachell Cipro  Past Medical History  Diagnosis Date  . PCOS (polycystic ovarian syndrome)   . History of migraine headaches   . Hypothyroidism   . GERD (gastroesophageal reflux disease)   . History of kidney stones   . Arthritis     back, knees, right elbow  . Anxiety   . High cholesterol     no current med.  . Hypertension     under control with meds., has been on med. x 4 yr.  . Depression   . Achilles tendonitis 02/2013    right  . Acquired Haglund's deformity of right heel 02/2013  . 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 Insulin resistance     does not check blood sugar at home  . History of electroconvulsive therapy   . Dental crowns present   . Diabetes mellitus without  complication   . PONV (postoperative nausea and vomiting)     also hx. of emergence delirium 2007   Past Surgical History  Procedure Laterality Date  . Appendectomy    . Pilonidal cyst excision  1994  . Total thyroidectomy  2003  . Ovarian cyst surgery Left 1998  . Lumbar laminectomy      X 3  . Achilles tendon surgery Left 2007  . Excision haglund's deformity with achilles tendon repair Right 02/25/2013    Procedure: RIGHT ACHILLES DEBRIDEMENT AND RECONSTRUCTION;  HAGLUND'S EXCISION; GASTROC RECESSION AND FLEXOR HALLUCIS LONGUS TRANSFER;  Surgeon: Wylene Simmer, MD;  Location: [facility];  Service: Orthopedics;  Laterality: Right;  . Tubal ligation Right 1984  . Esophagogastroduodenoscopy (egd) with propofol N/A 04/20/2014    Procedure: ESOPHAGOGASTRODUODENOSCOPY (EGD) WITH PROPOFOL;  Surgeon: Arta Silence, MD;  Location: [facility];  Service: Endoscopy;  Laterality: N/A;  . Colonoscopy with propofol N/A 04/20/2014    Procedure: COLONOSCOPY WITH PROPOFOL;  Surgeon: Arta Silence, MD;  Location: [facility];  Service: Endoscopy;  Laterality: N/A;   Family History  Problem Relation Age of Onset  . Breast cancer Mother   . Hypertension Mother   . Heart disease Father   . Depression Father   . Cancer Brother     THROAT  . Depression Brother   . Anxiety disorder Brother   . Depression Sister    History  Substance Use Topics  . Smoking status: Former Smoker -- 0.50 packs/day for  11 years    Types: Cigarettes    Quit date: 08/05/2013  . Smokeless tobacco: Never Used  . Alcohol Use: No   OB History    Gravida Para Term Preterm AB TAB SAB Ectopic Multiple Living   0              Review of Systems  10 systems reviewed and found to be negative, except as noted in the HPI.   Allergies  Sulfa antibiotics; Bupropion; Bupropion hcl; Codeine; and Sulfamethoxazole-trimethoprim  Home Medications   Prior to Admission medications   Medication Sig Start Date End  Date Taking? Authorizing Provider  colchicine 0.6 MG tablet Take 0.6 mg by mouth 2 (two) times daily.    Historical Provider, MD  DULoxetine (CYMBALTA) 60 MG capsule Take 60 mg by mouth every morning.     Historical Provider, MD  glucose blood (ONETOUCH VERIO) test strip Use to test blood sugar 3 times daily as instructed. 06/24/14   Philemon Kingdom, MD  ibuprofen (ADVIL,MOTRIN) 200 MG tablet Take 800 mg by mouth once as needed for moderate pain (muscle aches.).    Historical Provider, MD  insulin aspart (NOVOLOG) 100 UNIT/ML FlexPen Inject 18 Units into the skin 3 (three) times daily with meals. 07/14/14   Philemon Kingdom, MD  Insulin Glargine (TOUJEO SOLOSTAR) 300 UNIT/ML SOPN Inject 30 Units into the skin daily at 10 pm.    Historical Provider, MD  Insulin Pen Needle 32G X 4 MM MISC Use to inject insulin 3 times daily as instructed. 06/07/14   Philemon Kingdom, MD  Insulin Syringe-Needle U-100 (INSULIN SYRINGE .5CC/31GX5/16") 31G X 5/16" 0.5 ML MISC Use to inject insulin 1 time daily as instructed. 06/08/14   Philemon Kingdom, MD  L-Methylfolate-Algae (DEPLIN 15) 15-90.314 MG CAPS Take 1 capsule by mouth daily.    Historical Provider, MD  levothyroxine (SYNTHROID, LEVOTHROID) 200 MCG tablet Take 300 mcg by mouth daily before breakfast.     Historical Provider, MD  lisinopril-hydrochlorothiazide (PRINZIDE,ZESTORETIC) 10-12.5 MG per tablet Take 1 tablet by mouth every morning.     Historical Provider, MD  metFORMIN (GLUCOPHAGE-XR) 500 MG 24 hr tablet TAKE 2 TABLETS BY MOUTH 2 (TWO) TIMES DAILY WITH A MEAL. 04/15/14   Philemon Kingdom, MD  omeprazole (PRILOSEC) 40 MG capsule Take 40 mg by mouth daily.    Historical Provider, MD  pantoprazole (PROTONIX) 20 MG tablet Take 20 mg by mouth daily as needed. 06/18/14 06/18/15  Historical Provider, MD  promethazine (PHENERGAN) 25 MG tablet Take 1 tablet (25 mg total) by mouth every 6 (six) hours as needed for nausea or vomiting. 08/18/14   Teodoro Jeffreys,  PA-C  ranitidine (ZANTAC) 150 MG tablet Take 150 mg by mouth daily as needed for heartburn (heartburn).    Historical Provider, MD  sitaGLIPtin (JANUVIA) 100 MG tablet Take 100 mg by mouth daily.    Historical Provider, MD  Vortioxetine HBr (BRINTELLIX) 10 MG TABS Take 10 mg by mouth daily.    Historical Provider, MD   BP 136/57 mmHg  Pulse 91  Temp(Src) 98.5 F (36.9 C) (Oral)  Resp 16  Ht 5\' 11"  (1.803 m)  Wt 315 lb (142.883 kg)  BMI 43.95 kg/m2  SpO2 96%  LMP 08/01/2014 Physical Exam  Constitutional: She is oriented to person, place, and time. She appears well-developed and well-nourished. No distress.  HENT:  Head: Normocephalic.  Eyes: Conjunctivae and EOM are normal. Pupils are equal, round, and reactive to light.  Neck: Normal range of motion. Neck  supple.  FROM to C-spine. Pt can touch chin to chest without discomfort. No TTP of midline cervical spine.   Cardiovascular: Normal rate, regular rhythm and intact distal pulses.   Pulmonary/Chest: Effort normal and breath sounds normal. No stridor. No respiratory distress. She has no wheezes. She has no rales. She exhibits no tenderness.  Abdominal: Soft. Bowel sounds are normal. She exhibits no distension and no mass. There is no tenderness. There is no rebound and no guarding.  Musculoskeletal: Normal range of motion. She exhibits no edema.  Neurological: She is alert and oriented to person, place, and time.  Psychiatric: She has a normal mood and affect.  Nursing note and vitals reviewed.   ED Course  Procedures (including critical care time) Labs Review Labs Reviewed  BASIC METABOLIC PANEL - Abnormal; Notable for the following:    Glucose, Bld 189 (*)    All other components within normal limits  CBC WITH DIFFERENTIAL - Abnormal; Notable for the following:    RDW 17.4 (*)    All other components within normal limits  HEPATIC FUNCTION PANEL - Abnormal; Notable for the following:    AST 50 (*)    ALT 57 (*)    All  other components within normal limits  INFLUENZA PANEL BY PCR (TYPE A & B, H1N1)  I-STAT CG4 LACTIC ACID, ED    Imaging Review Dg Chest 2 View  08/18/2014   CLINICAL DATA:  Cough and fever and body aches  EXAM: CHEST  2 VIEW  COMPARISON:  Radiograph 06/17/2014  FINDINGS: Normal cardiac silhouette. No effusion, infiltrate, or pneumothorax. No acute osseous abnormality. Degenerative osteophytosis of the thoracic spine.  IMPRESSION: No acute cardiopulmonary process.  No evidence pneumonia.   Electronically Signed   By: Suzy Bouchard M.D.   On: 08/18/2014 18:53     EKG Interpretation None      MDM   Final diagnoses:  Acute bronchitis, unspecified organism  Dehydration  Non-intractable vomiting with nausea, vomiting of unspecified type    Filed Vitals:   08/18/14 1734 08/18/14 1800 08/18/14 1917 08/18/14 1940  BP: 111/66 105/66  136/57  Pulse: 88 89  91  Temp:      TempSrc:      Resp: 24   16  Height:      Weight:      SpO2: 99%  99% 96%    Medications  ondansetron (ZOFRAN) injection 4 mg (0 mg Intravenous Hold 08/18/14 1940)  sodium chloride 0.9 % bolus 1,000 mL (1,000 mLs Intravenous New Bag/Given 08/18/14 1940)  sodium chloride 0.9 % bolus 1,000 mL (0 mLs Intravenous Stopped 08/18/14 1937)  morphine 4 MG/ML injection 4 mg (4 mg Intravenous Given 08/18/14 1822)  albuterol (PROVENTIL) (2.5 MG/3ML) 0.083% nebulizer solution 5 mg (5 mg Nebulization Given 08/18/14 1913)    Tracy [name] is a pleasant 49 y.o. female presenting with several weeks of productive cough, fever and generalized fatigue, lightheadedness and several episodes of nausea and vomiting starting 4 days ago. Patient was seen by her primary care physician and started on antibiotics for a presumed pneumonia yesterday. Paper work shows that her saturations were 91% and her PCP felt it would be beneficial to come to the ED for admission and hydration. Patient declined at that time. Since then she's had  several episodes of vomiting and presents for evaluation. On my exam patient lung sounds are clear, she is saturating well on room air, there is no tachycardia. Patient has a normal lactic acid  and also no leukocytosis. Does not meet SIRS criteria also chest x-ray is negative.  Patient tolerated by mouth, maintaining sats of 98% while ambulating. She is amenable for discharge and reports improvement with hydration and morphine. I have encouraged her to continue with the Levaquin, albuterol and Spiriva. I will write her for Phenergan in addition to Zofran. We have had an extensive discussion of return precautions. She will follow closely with her primary care physician.  Discussed case with attending MD who agrees with plan and stability to d/c to home.   Evaluation does not show pathology that would require ongoing emergent intervention or inpatient treatment. Pt is hemodynamically stable and mentating appropriately. Discussed findings and plan with patient/guardian, who agrees with care plan. All questions answered. Return precautions discussed and outpatient follow up given.   New Prescriptions   PROMETHAZINE (PHENERGAN) 25 MG TABLET    Take 1 tablet (25 mg total) by mouth every 6 (six) hours as needed for nausea or vomiting.         Monico Blitz, PA-C 08/18/14 2030  Debby Freiberg, MD 08/19/14 [phone]

## 2014-08-18 NOTE — ED Notes (Signed)
Spo2 remains at 98% with ambulation

## 2014-08-19 LAB — INFLUENZA PANEL BY PCR (TYPE A & B)
H1N1FLUPCR: NOT DETECTED
Influenza A By PCR: NEGATIVE
Influenza B By PCR: NEGATIVE

## 2014-08-24 ENCOUNTER — Ambulatory Visit (HOSPITAL_COMMUNITY)
Admission: RE | Admit: 2014-08-24 | Discharge: 2014-08-24 | Disposition: A | Discharge status: Home / Self Care | Payer: 59 | Source: Ambulatory Visit | Attending: Gastroenterology | Admitting: Gastroenterology

## 2014-08-24 DIAGNOSIS — K760 Fatty (change of) liver, not elsewhere classified: Secondary | ICD-10-CM | POA: Diagnosis present

## 2014-08-25 ENCOUNTER — Ambulatory Visit (INDEPENDENT_AMBULATORY_CARE_PROVIDER_SITE_OTHER): Payer: 59 | Admitting: Internal Medicine

## 2014-08-25 ENCOUNTER — Encounter: Payer: Self-pay | Admitting: Internal Medicine

## 2014-08-25 VITALS — BP 118/62 | HR 89 | Temp 98.7°F | Resp 12 | Wt 316.0 lb

## 2014-08-25 DIAGNOSIS — E890 Postprocedural hypothyroidism: Secondary | ICD-10-CM

## 2014-08-25 DIAGNOSIS — E119 Type 2 diabetes mellitus without complications: Secondary | ICD-10-CM

## 2014-08-25 LAB — HEMOGLOBIN A1C: HEMOGLOBIN A1C: 7.7 % — AB (ref 4.6–6.5)

## 2014-08-25 MED ORDER — LEVOTHYROXINE SODIUM 200 MCG PO TABS: 300.0000 ug | ORAL_TABLET | Freq: Every day | ORAL | Status: DC

## 2014-08-25 NOTE — Progress Notes (Signed)
Patient ID: Tracy [name], female   DOB: 31-Oct-1965, 49 y.o.   MRN: [id number]  HPI: Tracy [name] is a 49 y.o.-year-old female, returning for f/u for DM2, dx in 06/01/2013, uncontrolled, without complications and  hypothyroidism. Last visit 2.5 mo ago.  She was in the ED since last visit, on 08/18/2014 >> acute bronchitis/PNA >> Levaquin. No steroids.  She was also dx with hepatic fibrosis - ? Gastric varices.  She lost 15 lbs in last 2 mo! Eating less.  DM2: Last hemoglobin A1c was Lab Results  Component Value Date   HGBA1C 8.3* 05/05/2014   HGBA1C 7.4* 01/20/2014   HGBA1C 5.7 07/04/2011   (06/04/2013) HbA1c 11.4%.  Pt is on a regimen of: - Metformin XR 1000 bid. She did not try the immediate release. - Januvia 100 mg daily in am. - Toujeo 50 units at bedtime. - NovoLog 18 units tid We stopped Glipizide in 06/2014.  Pt is  checking  her sugars 2x a day - better: - before meals: 100-110 >> 160-170s >> 92x1, 110-130 - bedtime: 132-180, 227x1 >> 110-120 No lows. Lowest 92.? Level of hypoglycemia awareness.  Uses a One Touch Verio IQ meter.  She started to use a fit bit. [facility] was [facility]. She started Weight Watching.  She went to the diabetes class, [facility].  - no CKD, last BUN/creatinine:  Lab Results  Component Value Date   BUN 10 08/18/2014   CREATININE 0.77 08/18/2014  - last eye exam was in 04/2014. No DR. She has blurry vision on and off. - no numbness and tingling in her feet.  Hypothyroidism: - she is on Levothyroxine 300 mcg in am  - She was staking Zantac 2x a day - separated from Levothyroxine - now off  Reviewed TFTs: Lab Results  Component Value Date   TSH 3.122 06/23/2014   TSH 8.52* 05/05/2014   TSH 17.07* 02/25/2014   TSH 3.00 01/20/2014   TSH 3.47 01/03/2012   TSH 10.25* 07/04/2011   TSH 1.10 02/08/2010   TSH 0.38 02/16/2009   TSH 0.27* 10/23/2007   FREET4 1.22 06/23/2014   FREET4 0.85 05/05/2014   FREET4 0.29* 02/25/2014   FREET4 0.91 01/20/2014   She c/o: - still poor sleep - dry skin - diarrhea - nocturia - itching - Low libido  She woke up on 06/17/2014 with CP radiating to neck and head >> went to the ED >> EKGs, Echo, troponins. She was dx'ed with pericarditis >> colchicine + ibuprofen >> now off as 2dECHO >> normal (Dr Johnsie Cancel).  ROS: see HPI+ Constitutional: + weight loss,  + subjective hyperthermia/hypothermia Eyes:+ blurry vision, no xerophthalmia ENT: + sore throat, no nodules palpated in throat, no dysphagia/odynophagia, no hoarseness Cardiovascular: no CP/SOB/palpitations/leg swelling Respiratory:+ cough/+ SOB/+ wheezing Gastrointestinal: no N/V/+D/no C/no heartburn Musculoskeletal: no muscle aches/no joint aches Skin: no rashes, + itching, + excessive hair growth Neurological: no tremors/numbness/tingling/dizziness Low libido.  I reviewed pt's medications, allergies, PMH, social hx, family hx and no changes required, except as mentioned above.  Patient mentioned papillary thyroid cancer in her PMH, however, per review of the thyroid pathology after thyroidectomy, this was benign  PE: BP 118/62 mmHg  Pulse 89  Temp(Src) 98.7 F (37.1 C) (Oral)  Resp 12  Wt 316 lb (143.337 kg)  SpO2 96%  LMP 08/01/2014 Wt Readings from Last 3 Encounters:  08/25/14 316 lb (143.337 kg)  08/18/14 315 lb (142.883 kg)  06/28/14  330 lb 6.4 oz (149.868 kg)   Constitutional: obese Eyes: PERRLA, EOMI, no exophthalmos ENT: moist mucous membranes, no thyromegaly, no cervical lymphadenopathy Cardiovascular: RRR, No MRG Respiratory: CTA B Gastrointestinal: abdomen soft, NT, ND, BS+ Musculoskeletal: no deformities, strength intact in all 4 Skin: moist, warm, no rashes Neurological: no tremor with outstretched hands, DTR normal in all 4  ASSESSMENT: 1. DM2, uncontrolled, without complications  2. Hypothyroidism - post-surgical  PLAN:  1. Patient  with improved DM2 control on oral meds + basal-bolus regimen. We stopped the Glipizide at last visit and we will also attempt to stop Januvia. I congratulated her for the weight loss! - I suggested to:  Patient Instructions  Please continue: - Metformin XR 1000 2x a day. - NovoLog 18 units 3x a day before meals. - continue Toujeo 50 units at bedtime. Stop Januvia x 1 week >> restart only if sugars higher.  Please return in  3 months with your sugar log.   Please stop at the lab.  - continue checking sugars at different times of the day - check 3 times a day, rotating checks - advised for yearly eye exams >> she is up to date.  - had PNA and flu vaccine this season - check HbA1c today - Return to clinic in 3 mo with sugar log   2. Hypothyroidism - post-surgical - labs normalized on LT4 back to 300 mcg  - will recheck TSH and fT4  At next visit  Office Visit on 08/25/2014  Component Date Value Ref Range Status  . Hgb A1c MFr Bld 08/25/2014 7.7* 4.6 - 6.5 % Final   Glycemic Control Guidelines for People with Diabetes:Non Diabetic:  <6%Goal of Therapy: <7%Additional Action Suggested:  >8%    HbA1c improving!

## 2014-08-25 NOTE — Patient Instructions (Signed)
Please continue: - Metformin XR 1000 2x a day. - NovoLog 18 units 3x a day before meals. - continue Toujeo 50 units at bedtime. Stop Januvia x 1 week >> restart only if sugars higher.  Please return in  3 months with your sugar log.   Please stop at the lab.

## 2014-08-31 DIAGNOSIS — G4730 Sleep apnea, unspecified: Secondary | ICD-10-CM

## 2014-09-13 ENCOUNTER — Telehealth: Payer: Self-pay | Admitting: Pulmonary Disease

## 2014-09-13 DIAGNOSIS — G4730 Sleep apnea, unspecified: Secondary | ICD-10-CM

## 2014-09-13 NOTE — Telephone Encounter (Signed)
HST 08/31/14 >> AHI 72.5 and SaO2 low 73%.  Will have my nurse inform pt that sleep study shows severe sleep apnea.  Options are 1) CPAP now, or 2) ROV now.  If she is okay with CPAP, then send order for auto CPAP with range 5 to 15 cm H2O with heated humidity and mask of choice.  Have download sent 1 month after starting CPAP, and ROV 2 months after starting CPAP.

## 2014-09-14 ENCOUNTER — Telehealth: Payer: Self-pay | Admitting: Internal Medicine

## 2014-09-14 NOTE — Telephone Encounter (Signed)
Patient stated that she has been having a lot of low b/s yesterday morning it was 55, 30 min later it was 82, 30 min later it was 93.  Patient stated that her b/s will not go over 100, Please advise

## 2014-09-14 NOTE — Telephone Encounter (Signed)
Please tell her to: Please continue: - Metformin XR 1000 2x a day. Decrease: - NovoLog 18 >> 14 units 3x a day before meals. - Toujeo 50 >> 40 units at bedtime.  Please call again with sugars in few days.

## 2014-09-15 ENCOUNTER — Encounter: Payer: Self-pay | Admitting: Pulmonary Disease

## 2014-09-15 NOTE — Telephone Encounter (Signed)
Results have been explained to patient, pt expressed understanding. Order placed for CPAP. Recall for 2 mo OV entered. Nothing further needed.

## 2014-09-15 NOTE — Telephone Encounter (Signed)
Called pt and lvm advising her per Dr Arman Filter note. Advised pt to call with her bs readings on Monday.

## 2014-09-20 NOTE — Telephone Encounter (Signed)
error 

## 2014-10-28 ENCOUNTER — Encounter: Payer: Self-pay | Admitting: Pulmonary Disease

## 2014-10-31 ENCOUNTER — Other Ambulatory Visit (HOSPITAL_COMMUNITY): Payer: Self-pay

## 2014-10-31 ENCOUNTER — Emergency Department (HOSPITAL_COMMUNITY): Payer: 59

## 2014-10-31 ENCOUNTER — Encounter (HOSPITAL_COMMUNITY): Payer: Self-pay | Admitting: Neurology

## 2014-10-31 ENCOUNTER — Observation Stay (HOSPITAL_COMMUNITY)
Admission: EM | Admit: 2014-10-31 | Discharge: 2014-11-01 | Disposition: A | Discharge status: Home / Self Care | Payer: 59 | Attending: Internal Medicine | Admitting: Internal Medicine

## 2014-10-31 DIAGNOSIS — I10 Essential (primary) hypertension: Secondary | ICD-10-CM | POA: Diagnosis present

## 2014-10-31 DIAGNOSIS — R0789 Other chest pain: Principal | ICD-10-CM | POA: Insufficient documentation

## 2014-10-31 DIAGNOSIS — Z87891 Personal history of nicotine dependence: Secondary | ICD-10-CM | POA: Diagnosis not present

## 2014-10-31 DIAGNOSIS — F419 Anxiety disorder, unspecified: Secondary | ICD-10-CM | POA: Diagnosis not present

## 2014-10-31 DIAGNOSIS — K219 Gastro-esophageal reflux disease without esophagitis: Secondary | ICD-10-CM | POA: Insufficient documentation

## 2014-10-31 DIAGNOSIS — E282 Polycystic ovarian syndrome: Secondary | ICD-10-CM | POA: Diagnosis present

## 2014-10-31 DIAGNOSIS — R079 Chest pain, unspecified: Secondary | ICD-10-CM | POA: Diagnosis present

## 2014-10-31 DIAGNOSIS — Z794 Long term (current) use of insulin: Secondary | ICD-10-CM | POA: Insufficient documentation

## 2014-10-31 DIAGNOSIS — K766 Portal hypertension: Secondary | ICD-10-CM | POA: Insufficient documentation

## 2014-10-31 DIAGNOSIS — F32A Depression, unspecified: Secondary | ICD-10-CM

## 2014-10-31 DIAGNOSIS — F418 Other specified anxiety disorders: Secondary | ICD-10-CM | POA: Diagnosis not present

## 2014-10-31 DIAGNOSIS — E890 Postprocedural hypothyroidism: Secondary | ICD-10-CM | POA: Diagnosis present

## 2014-10-31 DIAGNOSIS — E1165 Type 2 diabetes mellitus with hyperglycemia: Secondary | ICD-10-CM | POA: Diagnosis not present

## 2014-10-31 DIAGNOSIS — G4733 Obstructive sleep apnea (adult) (pediatric): Secondary | ICD-10-CM | POA: Diagnosis not present

## 2014-10-31 DIAGNOSIS — E785 Hyperlipidemia, unspecified: Secondary | ICD-10-CM | POA: Insufficient documentation

## 2014-10-31 DIAGNOSIS — E119 Type 2 diabetes mellitus without complications: Secondary | ICD-10-CM

## 2014-10-31 DIAGNOSIS — I864 Gastric varices: Secondary | ICD-10-CM | POA: Diagnosis not present

## 2014-10-31 DIAGNOSIS — I8500 Esophageal varices without bleeding: Secondary | ICD-10-CM | POA: Diagnosis not present

## 2014-10-31 DIAGNOSIS — F329 Major depressive disorder, single episode, unspecified: Secondary | ICD-10-CM | POA: Diagnosis not present

## 2014-10-31 DIAGNOSIS — E039 Hypothyroidism, unspecified: Secondary | ICD-10-CM | POA: Insufficient documentation

## 2014-10-31 DIAGNOSIS — G43909 Migraine, unspecified, not intractable, without status migrainosus: Secondary | ICD-10-CM | POA: Diagnosis present

## 2014-10-31 HISTORY — DX: Depression, unspecified: F32.A

## 2014-10-31 LAB — CBC
HEMATOCRIT: 43.1 % (ref 36.0–46.0)
HEMOGLOBIN: 14.5 g/dL (ref 12.0–15.0)
MCH: 28.5 pg (ref 26.0–34.0)
MCHC: 33.6 g/dL (ref 30.0–36.0)
MCV: 84.7 fL (ref 78.0–100.0)
Platelets: 237 10*3/uL (ref 150–400)
RBC: 5.09 MIL/uL (ref 3.87–5.11)
RDW: 15.8 % — AB (ref 11.5–15.5)
WBC: 7.4 10*3/uL (ref 4.0–10.5)

## 2014-10-31 LAB — BASIC METABOLIC PANEL
Anion gap: 13 (ref 5–15)
BUN: 13 mg/dL (ref 6–23)
CHLORIDE: 104 mmol/L (ref 96–112)
CO2: 19 mmol/L (ref 19–32)
CREATININE: 0.83 mg/dL (ref 0.50–1.10)
Calcium: 9.7 mg/dL (ref 8.4–10.5)
GFR calc Af Amer: >90 mL/min (ref >90)
GFR calc non Af Amer: 83 mL/min — ABNORMAL LOW (ref >90)
Glucose, Bld: 76 mg/dL (ref 70–99)
Potassium: 4.1 mmol/L (ref 3.5–5.1)
Sodium: 136 mmol/L (ref 135–145)

## 2014-10-31 LAB — GLUCOSE, CAPILLARY: Glucose-Capillary: 87 mg/dL (ref 70–99)

## 2014-10-31 LAB — I-STAT TROPONIN, ED: TROPONIN I, POC: 0.01 ng/mL (ref 0.00–0.08)

## 2014-10-31 LAB — TROPONIN I: Troponin I: <0.03 ng/mL (ref <0.031)

## 2014-10-31 MED ORDER — LISINOPRIL 10 MG PO TABS
10.0000 mg | ORAL_TABLET | Freq: Every day | ORAL | Status: DC
  Administered 2014-11-01: 10 mg via ORAL
  Filled 2014-10-31 (×2): qty 1

## 2014-10-31 MED ORDER — LEVOTHYROXINE SODIUM 150 MCG PO TABS
300.0000 ug | ORAL_TABLET | Freq: Every day | ORAL | Status: DC
  Administered 2014-11-01: 300 ug via ORAL
  Filled 2014-10-31 (×2): qty 2

## 2014-10-31 MED ORDER — HYDROCHLOROTHIAZIDE 12.5 MG PO CAPS
12.5000 mg | ORAL_CAPSULE | Freq: Every day | ORAL | Status: DC
  Administered 2014-11-01: 12.5 mg via ORAL
  Filled 2014-10-31: qty 1

## 2014-10-31 MED ORDER — SODIUM CHLORIDE 0.9 % IV BOLUS (SEPSIS): 1000.0000 mL | Freq: Once | INTRAVENOUS | Status: AC

## 2014-10-31 MED ORDER — INSULIN GLARGINE 300 UNIT/ML ~~LOC~~ SOPN: 50.0000 [IU] | PEN_INJECTOR | Freq: Every day | SUBCUTANEOUS | Status: DC

## 2014-10-31 MED ORDER — NITROGLYCERIN 0.4 MG SL SUBL
0.4000 mg | SUBLINGUAL_TABLET | SUBLINGUAL | Status: DC | PRN
  Administered 2014-10-31 (×2): 0.4 mg via SUBLINGUAL
  Filled 2014-10-31 (×2): qty 1

## 2014-10-31 MED ORDER — INSULIN ASPART 100 UNIT/ML ~~LOC~~ SOLN: 18.0000 [IU] | Freq: Three times a day (TID) | SUBCUTANEOUS | Status: DC

## 2014-10-31 MED ORDER — ASPIRIN EC 325 MG PO TBEC
325.0000 mg | DELAYED_RELEASE_TABLET | Freq: Every day | ORAL | Status: DC
  Administered 2014-11-01: 325 mg via ORAL
  Filled 2014-10-31: qty 1

## 2014-10-31 MED ORDER — PANTOPRAZOLE SODIUM 40 MG PO TBEC
40.0000 mg | DELAYED_RELEASE_TABLET | Freq: Every day | ORAL | Status: DC
  Administered 2014-11-01: 40 mg via ORAL
  Filled 2014-10-31: qty 1

## 2014-10-31 MED ORDER — INSULIN ASPART 100 UNIT/ML ~~LOC~~ SOLN: 0.0000 [IU] | Freq: Every day | SUBCUTANEOUS | Status: DC

## 2014-10-31 MED ORDER — VORTIOXETINE HBR 10 MG PO TABS: 20.0000 mg | ORAL_TABLET | Freq: Every day | ORAL | Status: DC

## 2014-10-31 MED ORDER — DEPLIN 15 15-90.314 MG PO CAPS: 1.0000 | ORAL_CAPSULE | Freq: Every day | ORAL | Status: DC

## 2014-10-31 MED ORDER — METOPROLOL TARTRATE 25 MG PO TABS
25.0000 mg | ORAL_TABLET | Freq: Two times a day (BID) | ORAL | Status: DC
  Filled 2014-10-31 (×3): qty 1

## 2014-10-31 MED ORDER — GI COCKTAIL ~~LOC~~
30.0000 mL | Freq: Once | ORAL | Status: AC
  Administered 2014-10-31: 30 mL via ORAL
  Filled 2014-10-31: qty 30

## 2014-10-31 MED ORDER — ONDANSETRON HCL 4 MG/2ML IJ SOLN
4.0000 mg | Freq: Four times a day (QID) | INTRAMUSCULAR | Status: DC | PRN
  Administered 2014-10-31: 4 mg via INTRAVENOUS
  Filled 2014-10-31: qty 2

## 2014-10-31 MED ORDER — INSULIN GLARGINE 100 UNIT/ML ~~LOC~~ SOLN
50.0000 [IU] | Freq: Every day | SUBCUTANEOUS | Status: DC
  Administered 2014-10-31: 40 [IU] via SUBCUTANEOUS
  Filled 2014-10-31 (×2): qty 0.5

## 2014-10-31 MED ORDER — LINAGLIPTIN 5 MG PO TABS
5.0000 mg | ORAL_TABLET | Freq: Every day | ORAL | Status: DC
  Administered 2014-11-01: 5 mg via ORAL
  Filled 2014-10-31 (×2): qty 1

## 2014-10-31 MED ORDER — COLCHICINE 0.6 MG PO TABS
0.6000 mg | ORAL_TABLET | Freq: Two times a day (BID) | ORAL | Status: DC
  Filled 2014-10-31 (×3): qty 1

## 2014-10-31 MED ORDER — INSULIN ASPART 100 UNIT/ML FLEXPEN: 18.0000 [IU] | PEN_INJECTOR | Freq: Three times a day (TID) | SUBCUTANEOUS | Status: DC

## 2014-10-31 MED ORDER — DULOXETINE HCL 60 MG PO CPEP
60.0000 mg | ORAL_CAPSULE | Freq: Every morning | ORAL | Status: DC
  Filled 2014-10-31: qty 1

## 2014-10-31 MED ORDER — FAMOTIDINE 20 MG PO TABS
20.0000 mg | ORAL_TABLET | Freq: Two times a day (BID) | ORAL | Status: DC
  Administered 2014-10-31 – 2014-11-01 (×2): 20 mg via ORAL
  Filled 2014-10-31 (×3): qty 1

## 2014-10-31 MED ORDER — ASPIRIN 81 MG PO CHEW
243.0000 mg | CHEWABLE_TABLET | Freq: Once | ORAL | Status: AC
  Administered 2014-10-31: 243 mg via ORAL
  Filled 2014-10-31: qty 3

## 2014-10-31 MED ORDER — NITROGLYCERIN 0.4 MG SL SUBL
0.4000 mg | SUBLINGUAL_TABLET | SUBLINGUAL | Status: DC | PRN
  Administered 2014-10-31 (×4): 0.4 mg via SUBLINGUAL
  Filled 2014-10-31 (×2): qty 1

## 2014-10-31 MED ORDER — LISINOPRIL-HYDROCHLOROTHIAZIDE 10-12.5 MG PO TABS: 1.0000 | ORAL_TABLET | Freq: Every morning | ORAL | Status: DC

## 2014-10-31 MED ORDER — ACETAMINOPHEN 325 MG PO TABS
650.0000 mg | ORAL_TABLET | ORAL | Status: DC | PRN
  Administered 2014-10-31: 650 mg via ORAL
  Filled 2014-10-31: qty 2

## 2014-10-31 MED ORDER — ENOXAPARIN SODIUM 40 MG/0.4ML ~~LOC~~ SOLN
40.0000 mg | SUBCUTANEOUS | Status: DC
  Filled 2014-10-31 (×2): qty 0.4

## 2014-10-31 MED ORDER — INSULIN ASPART 100 UNIT/ML ~~LOC~~ SOLN
0.0000 [IU] | Freq: Three times a day (TID) | SUBCUTANEOUS | Status: DC
  Administered 2014-11-01: 3 [IU] via SUBCUTANEOUS

## 2014-10-31 NOTE — ED Provider Notes (Signed)
CSN: [id number]     Arrival date & time 10/31/14  1343 History   First MD Initiated Contact with Patient 10/31/14 1411     Chief Complaint  Patient presents with  . Chest Pain     (Consider location/radiation/quality/duration/timing/severity/associated sxs/prior Treatment) HPI Comments: Patient with past medical history of hypertension, high cholesterol, diabetes, obesity, anxiety and depression, presents to the emergency department with chief complaint of chest pain. Patient states the pain started several days ago. It has been constant to the left side of her chest. She states that today the pain began to radiate down her left arm and up to her left neck. She denies any associated nausea or vomiting, but does have some associated shortness of breath. She has tried taking 81 mg of aspirin and some Aleve this morning with no relief. She reports similar episode back in December, and was seen by cardiology and had a normal echocardiogram. She also states that she has severe anxiety, and thinks that this could be contributing to her symptoms.  The history is provided by the patient. No language interpreter was used.    Past Medical History  Diagnosis Date  . PCOS (polycystic ovarian syndrome)   . History of migraine headaches   . Hypothyroidism   . GERD (gastroesophageal reflux disease)   . History of kidney stones   . Arthritis     back, knees, right elbow  . Anxiety   . High cholesterol     no current med.  . Hypertension     under control with meds., has been on med. x 4 yr.  . Depression   . Achilles tendonitis 02/2013    right  . Acquired Haglund's deformity of right heel 02/2013  . 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 Insulin resistance     does not check blood sugar at home  . History of electroconvulsive therapy   . Dental crowns present   . Diabetes mellitus without complication   . PONV (postoperative nausea and vomiting)     also hx. of emergence delirium 2007   Past  Surgical History  Procedure Laterality Date  . Appendectomy    . Pilonidal cyst excision  1994  . Total thyroidectomy  2003  . Ovarian cyst surgery Left 1998  . Lumbar laminectomy      X 3  . Achilles tendon surgery Left 2007  . Excision haglund's deformity with achilles tendon repair Right 02/25/2013    Procedure: RIGHT ACHILLES DEBRIDEMENT AND RECONSTRUCTION;  HAGLUND'S EXCISION; GASTROC RECESSION AND FLEXOR HALLUCIS LONGUS TRANSFER;  Surgeon: Wylene Simmer, MD;  Location: [place];  Service: Orthopedics;  Laterality: Right;  . Tubal ligation Right 1984  . Esophagogastroduodenoscopy (egd) with propofol N/A 04/20/2014    Procedure: ESOPHAGOGASTRODUODENOSCOPY (EGD) WITH PROPOFOL;  Surgeon: Arta Silence, MD;  Location: [facility];  Service: Endoscopy;  Laterality: N/A;  . Colonoscopy with propofol N/A 04/20/2014    Procedure: COLONOSCOPY WITH PROPOFOL;  Surgeon: Arta Silence, MD;  Location: [facility];  Service: Endoscopy;  Laterality: N/A;   Family History  Problem Relation Age of Onset  . Breast cancer Mother   . Hypertension Mother   . Heart disease Father   . Depression Father   . Cancer Brother     THROAT  . Depression Brother   . Anxiety disorder Brother   . Depression Sister    History  Substance Use Topics  . Smoking status: Former Smoker -- 0.50 packs/day for 11 years  Types: Cigarettes    Quit date: 08/05/2013  . Smokeless tobacco: Never Used  . Alcohol Use: No   OB History    Gravida Para Term Preterm AB TAB SAB Ectopic Multiple Living   0              Review of Systems  Constitutional: Negative for fever and chills.  Respiratory: Positive for shortness of breath.   Cardiovascular: Positive for chest pain.  Gastrointestinal: Negative for nausea, vomiting, diarrhea and constipation.  Genitourinary: Negative for dysuria.  All other systems reviewed and are negative.     Allergies  Sulfa antibiotics; Bupropion; Bupropion hcl; Codeine;  and Sulfamethoxazole-trimethoprim  Home Medications   Prior to Admission medications   Medication Sig Start Date End Date Taking? Authorizing Provider  colchicine 0.6 MG tablet Take 0.6 mg by mouth 2 (two) times daily.    Historical Provider, MD  DULoxetine (CYMBALTA) 60 MG capsule Take 60 mg by mouth every morning.     Historical Provider, MD  glucose blood (ONETOUCH VERIO) test strip Use to test blood sugar 3 times daily as instructed. 06/24/14   Philemon Kingdom, MD  ibuprofen (ADVIL,MOTRIN) 200 MG tablet Take 800 mg by mouth once as needed for moderate pain (muscle aches.).    Historical Provider, MD  insulin aspart (NOVOLOG) 100 UNIT/ML FlexPen Inject 18 Units into the skin 3 (three) times daily with meals. 07/14/14   Philemon Kingdom, MD  Insulin Glargine (TOUJEO SOLOSTAR) 300 UNIT/ML SOPN Inject 50 Units into the skin daily at 10 pm.     Historical Provider, MD  Insulin Pen Needle 32G X 4 MM MISC Use to inject insulin 3 times daily as instructed. 06/07/14   Philemon Kingdom, MD  Insulin Syringe-Needle U-100 (INSULIN SYRINGE .5CC/31GX5/16") 31G X 5/16" 0.5 ML MISC Use to inject insulin 1 time daily as instructed. 06/08/14   Philemon Kingdom, MD  L-Methylfolate-Algae (DEPLIN 15) 15-90.314 MG CAPS Take 1 capsule by mouth daily.    Historical Provider, MD  levothyroxine (SYNTHROID, LEVOTHROID) 200 MCG tablet Take 1.5 tablets (300 mcg total) by mouth daily before breakfast. 08/25/14   Philemon Kingdom, MD  lisinopril-hydrochlorothiazide (PRINZIDE,ZESTORETIC) 10-12.5 MG per tablet Take 1 tablet by mouth every morning.     Historical Provider, MD  metFORMIN (GLUCOPHAGE-XR) 500 MG 24 hr tablet TAKE 2 TABLETS BY MOUTH 2 (TWO) TIMES DAILY WITH A MEAL. 04/15/14   Philemon Kingdom, MD  omeprazole (PRILOSEC) 40 MG capsule Take 40 mg by mouth daily.    Historical Provider, MD  pantoprazole (PROTONIX) 20 MG tablet Take 20 mg by mouth daily as needed. 06/18/14 06/18/15  Historical Provider, MD  promethazine  (PHENERGAN) 25 MG tablet Take 1 tablet (25 mg total) by mouth every 6 (six) hours as needed for nausea or vomiting. Patient not taking: Reported on 08/25/2014 08/18/14   Elmyra Ricks Pisciotta, PA-C  ranitidine (ZANTAC) 150 MG tablet Take 150 mg by mouth daily as needed for heartburn (heartburn).    Historical Provider, MD  sitaGLIPtin (JANUVIA) 100 MG tablet Take 100 mg by mouth daily.    Historical Provider, MD  Vortioxetine HBr (BRINTELLIX) 10 MG TABS Take 20 mg by mouth daily.     Historical Provider, MD   BP 118/64 mmHg  Pulse 91  Temp(Src) 97.7 F (36.5 C)  Resp 17  Ht 5\' 11"  (1.803 m)  Wt 311 lb (141.069 kg)  BMI 43.40 kg/m2  SpO2 99%  LMP 09/18/2014 Physical Exam  Constitutional: She is oriented to person, place, and time.  She appears well-developed and well-nourished.  HENT:  Head: Normocephalic and atraumatic.  Eyes: Conjunctivae and EOM are normal. Pupils are equal, round, and reactive to light.  Neck: Normal range of motion. Neck supple.  Cardiovascular: Normal rate and regular rhythm.  Exam reveals no gallop and no friction rub.   No murmur heard. Pulmonary/Chest: Effort normal and breath sounds normal. No respiratory distress. She has no wheezes. She has no rales. She exhibits no tenderness.  Abdominal: Soft. Bowel sounds are normal. She exhibits no distension and no mass. There is no tenderness. There is no rebound and no guarding.  Musculoskeletal: Normal range of motion. She exhibits no edema or tenderness.  Neurological: She is alert and oriented to person, place, and time.  Skin: Skin is warm and dry.  Psychiatric: She has a normal mood and affect. Her behavior is normal. Judgment and thought content normal.  Nursing note and vitals reviewed.   ED Course  Procedures (including critical care time) ED ECG REPORT  I personally interpreted this EKG   Date: 10/31/2014   Rate: 93  Rhythm: normal sinus rhythm  QRS Axis: normal  Intervals: normal  ST/T Wave abnormalities:  nonspecific ST changes compared to prior  Conduction Disutrbances:none  Narrative Interpretation:   Old EKG Reviewed: changes noted   Results for orders placed or performed during the hospital encounter of 10/31/14  CBC  Result Value Ref Range   WBC 7.4 4.0 - 10.5 K/uL   RBC 5.09 3.87 - 5.11 MIL/uL   Hemoglobin 14.5 12.0 - 15.0 g/dL   HCT 43.1 36.0 - 46.0 %   MCV 84.7 78.0 - 100.0 fL   MCH 28.5 26.0 - 34.0 pg   MCHC 33.6 30.0 - 36.0 g/dL   RDW 15.8 (H) 11.5 - 15.5 %   Platelets 237 150 - 400 K/uL  Basic metabolic panel  Result Value Ref Range   Sodium 136 135 - 145 mmol/L   Potassium 4.1 3.5 - 5.1 mmol/L   Chloride 104 96 - 112 mmol/L   CO2 19 19 - 32 mmol/L   Glucose, Bld 76 70 - 99 mg/dL   BUN 13 6 - 23 mg/dL   Creatinine, Ser 0.83 0.50 - 1.10 mg/dL   Calcium 9.7 8.4 - 10.5 mg/dL   GFR calc non Af Amer 83 (L) >90 mL/min   GFR calc Af Amer >90 >90 mL/min   Anion gap 13 5 - 15  I-stat troponin, ED (not at [facility])  Result Value Ref Range   Troponin i, poc 0.01 0.00 - 0.08 ng/mL   Comment 3           Dg Chest 2 View  10/31/2014   CLINICAL DATA:  Chest pain.  EXAM: CHEST  2 VIEW  COMPARISON:  06/06/2014 02/20/2010  FINDINGS: Mediastinum hilar structures normal. Heart size normal. No focal alveolar infiltrate. No pleural effusion or pneumothorax. No acute bony abnormality.  IMPRESSION: No acute cardiopulmonary disease.  Stable chest.   Electronically Signed   By: Marcello Moores  Register   On: 10/31/2014 14:36     Imaging Review No results found.   EKG Interpretation None      MDM   Final diagnoses:  Chest pain, unspecified chest pain type    Patient with chest pain started a couple days ago. Pain has been constant. New symptoms of left arm radiating pain started today. Will check labs, chest x-ray, and will reassess.  HEART score is 5.    Patient had some improvement  with sublingual nitroglycerin. She declines morphine.  Patient reassessed, states the pain is now waxing  and waning, but never leaves completely. She still reports of some shortness of breath. Given her risk factors and changing symptoms, I believe the patient should be admitted. Patient discussed with Dr. Rogene Houston, who recommends consulting hospitalist for admission.  Appreciated Dr. Clementeen Graham for admitting the patient.      Montine Circle, PA-C 10/31/14 [place], MD 10/31/14 [phone]

## 2014-10-31 NOTE — ED Notes (Signed)
PA Browning at bedside. 

## 2014-10-31 NOTE — ED Notes (Signed)
Attempted to call report

## 2014-10-31 NOTE — ED Notes (Signed)
Patient transported to X-ray 

## 2014-10-31 NOTE — ED Notes (Signed)
X-ray aware that patient ready.

## 2014-10-31 NOTE — ED Notes (Addendum)
Pt reports left sided cp for several days; today pain is constant to left chest radiating down to left arm and neck. Denies, n/v, appears SOB. Denies cardiac hx, repeats similar episode and had echo that was normal. Reports 81 mg aspirin and ibuprofen this morning.

## 2014-10-31 NOTE — ED Notes (Signed)
Admitting doctor at bedside 

## 2014-10-31 NOTE — Progress Notes (Signed)
Placed patient on CPAP with home settings of auto 5-15cmH20.  Patient is tolerating well at this time.

## 2014-10-31 NOTE — H&P (Signed)
[facility] History and Physical  Tracy [name] AYT:[id number] DOB: 05-Apr-1966 DOA: 10/31/2014  Referring physician: ED PCP: Rachell Cipro, MD   Chief Complaint:  Chest pain since one day   HPI:  49 year old morbidly obese female with history of cystic ovarian disease, hypothyroidism, OSA on CPAP, hypertension, hyperlipidemia, uncontrolled type 2 diabetes mellitus, chronic migraine, anxiety and depression, GERD, who presented to the ED with left-sided chest pain for 4 days worsened since this morning. Patient reports having off-and-on left-sided chest pain for the past 4 days. Patient reports having long-standing chest pain of similar nature for past several months. However this morning the pain was worse on the left side, 8/10 in severity and then started radiating to the neck and her left arm.  she reports this type of pain symptom to be new.  She took some Motrin without much relief. Denies any aggravating or relieving factors. Denies any chest trauma or lifting the object. Denies any shortness of breath, outpatient dictations, orthopnea or PND. Patient denies headache, dizziness, fever, chills, nausea , vomiting,  palpitations, SOB, abdominal pain, bowel or urinary symptoms. Denies change in weight or appetite.  Course in the ED Patient was mild hypertensive with systolic blood pressure in the 90s, remaining vitals were stable. Patient was given 3 tablets of 81 mg aspirin and sublingual nitrate following which her chest pain symptoms improved. He was also given 1 L IV normal saline bolus. Blood work done showed normal CBC and chemistry. Initial troponin was negative. EKG done showed sinus rhythm with ST flattening in inferior leads. Hospitalist admission requested to telemetry.  Review of Systems:  Constitutional: Denies fever, chills, diaphoresis, appetite change and fatigue.  HEENT: DenieZoeller hearing symptoms, congestion, sore throat, trouble swallowing, neck pain or  stiffness.   Respiratory: Denies SOB, DOE, cough, chest tightness,  and wheezing.   Cardiovascular:chest pain++, denies palpitations or leg swellings  Gastrointestinal: Denies nausea, vomiting, abdominal pain, diarrhea, constipation, blood in stool and abdominal distention.  Genitourinary: Denies dysuria,  hematuria, flank pain and difficulty urinating.  Endocrine: Denies: hot or cold intolerance, , polyuria, polydipsia. Musculoskeletal: Denies myalgias, back pain,joint pain or swelling  Skin: Denies pallor, rash and wound.  Neurological: Denies dizziness, seizures, syncope, weakness, light-headedness, numbness and headaches.  Hematological: Denies adenopathy.  Psychiatric/Behavioral: Denies confusion   Past Medical History  Diagnosis Date  . PCOS (polycystic ovarian syndrome)   . History of migraine headaches   . Hypothyroidism   . GERD (gastroesophageal reflux disease)   . History of kidney stones   . Arthritis     back, knees, right elbow  . Anxiety   . High cholesterol     no current med.  . Hypertension     under control with meds., has been on med. x 4 yr.  . Depression   . Achilles tendonitis 02/2013    right  . Acquired Haglund's deformity of right heel 02/2013  . 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 Insulin resistance     does not check blood sugar at home  . History of electroconvulsive therapy   . Dental crowns present   . Diabetes mellitus without complication   . PONV (postoperative nausea and vomiting)     also hx. of emergence delirium 2007   Past Surgical History  Procedure Laterality Date  . Appendectomy    . Pilonidal cyst excision  1994  . Total thyroidectomy  2003  . Ovarian cyst surgery Left 1998  . Lumbar laminectomy  X 3  . Achilles tendon surgery Left 2007  . Excision haglund's deformity with achilles tendon repair Right 02/25/2013    Procedure: RIGHT ACHILLES DEBRIDEMENT AND RECONSTRUCTION;  HAGLUND'S EXCISION; GASTROC RECESSION AND FLEXOR  HALLUCIS LONGUS TRANSFER;  Surgeon: Wylene Simmer, MD;  Location: [place];  Service: Orthopedics;  Laterality: Right;  . Tubal ligation Right 1984  . Esophagogastroduodenoscopy (egd) with propofol N/A 04/20/2014    Procedure: ESOPHAGOGASTRODUODENOSCOPY (EGD) WITH PROPOFOL;  Surgeon: Arta Silence, MD;  Location: [facility];  Service: Endoscopy;  Laterality: N/A;  . Colonoscopy with propofol N/A 04/20/2014    Procedure: COLONOSCOPY WITH PROPOFOL;  Surgeon: Arta Silence, MD;  Location: [facility];  Service: Endoscopy;  Laterality: N/A;   Social History:  reports that she quit smoking about 14 months ago. Her smoking use included Cigarettes. She has a 5.5 pack-year smoking history. She has never used smokeless tobacco. She reports that she does not drink alcohol or use illicit drugs.  Allergies  Allergen Reactions  . Sulfa Antibiotics Itching, Swelling and Rash  . Bupropion Rash  . Bupropion Hcl Itching and Rash  . Codeine Itching  . Sulfamethoxazole-Trimethoprim Rash    Family History  Problem Relation Age of Onset  . Breast cancer Mother   . Hypertension Mother   . Heart disease Father   . Depression Father   . Cancer Brother     THROAT  . Depression Brother   . Anxiety disorder Brother   . Depression Sister    Father died of MI at the age of 70   Prior to Admission medications   Medication Sig Start Date End Date Taking? Authorizing Provider  glucose blood (ONETOUCH VERIO) test strip Use to test blood sugar 3 times daily as instructed. 06/24/14  Yes Philemon Kingdom, MD  colchicine 0.6 MG tablet Take 0.6 mg by mouth 2 (two) times daily.    Historical Provider, MD  DULoxetine (CYMBALTA) 60 MG capsule Take 60 mg by mouth every morning.     Historical Provider, MD  ibuprofen (ADVIL,MOTRIN) 200 MG tablet Take 800 mg by mouth once as needed for moderate pain (muscle aches.).    Historical Provider, MD  insulin aspart (NOVOLOG) 100 UNIT/ML FlexPen Inject 18 Units  into the skin 3 (three) times daily with meals. 07/14/14   Philemon Kingdom, MD  Insulin Glargine (TOUJEO SOLOSTAR) 300 UNIT/ML SOPN Inject 50 Units into the skin daily at 10 pm.     Historical Provider, MD  Insulin Pen Needle 32G X 4 MM MISC Use to inject insulin 3 times daily as instructed. 06/07/14   Philemon Kingdom, MD  Insulin Syringe-Needle U-100 (INSULIN SYRINGE .5CC/31GX5/16") 31G X 5/16" 0.5 ML MISC Use to inject insulin 1 time daily as instructed. 06/08/14   Philemon Kingdom, MD  L-Methylfolate-Algae (DEPLIN 15) 15-90.314 MG CAPS Take 1 capsule by mouth daily.    Historical Provider, MD  levothyroxine (SYNTHROID, LEVOTHROID) 200 MCG tablet Take 1.5 tablets (300 mcg total) by mouth daily before breakfast. 08/25/14   Philemon Kingdom, MD  lisinopril-hydrochlorothiazide (PRINZIDE,ZESTORETIC) 10-12.5 MG per tablet Take 1 tablet by mouth every morning.     Historical Provider, MD  metFORMIN (GLUCOPHAGE-XR) 500 MG 24 hr tablet TAKE 2 TABLETS BY MOUTH 2 (TWO) TIMES DAILY WITH A MEAL. 04/15/14   Philemon Kingdom, MD  omeprazole (PRILOSEC) 40 MG capsule Take 40 mg by mouth daily.    Historical Provider, MD  pantoprazole (PROTONIX) 20 MG tablet Take 20 mg by mouth daily as needed. 06/18/14 06/18/15  Historical Provider, MD  promethazine (PHENERGAN) 25 MG tablet Take 1 tablet (25 mg total) by mouth every 6 (six) hours as needed for nausea or vomiting. Patient not taking: Reported on 08/25/2014 08/18/14   Elmyra Ricks Pisciotta, PA-C  ranitidine (ZANTAC) 150 MG tablet Take 150 mg by mouth daily as needed for heartburn (heartburn).    Historical Provider, MD  sitaGLIPtin (JANUVIA) 100 MG tablet Take 100 mg by mouth daily.    Historical Provider, MD  Vortioxetine HBr (BRINTELLIX) 10 MG TABS Take 20 mg by mouth daily.     Historical Provider, MD     Physical Exam:  Filed Vitals:   10/31/14 1407 10/31/14 1447 10/31/14 1454 10/31/14 1500  BP:  108/70 102/60 90/51  Pulse:  77    Temp:      Resp:  24     Height: 5\' 11"  (1.803 m)     Weight: 141.069 kg (311 lb)     SpO2:  95%      Constitutional: Vital signs reviewed. middle aged obese female lying in bed appears anxious NT: no pallor, no icterus, moist oral mucosa, no cervical lymphadenopathy Cardiovascular: RRR, S1 normal, S2 normal, no MRG Chest: CTAB, no wheezes, rales, or crackles  GI: obese, nontender, nondistended, bowel sounds present Ext: warm, no edema Neurological: alert  and oriented, nonfocal  Labs on Admission:  Basic Metabolic Panel:  Recent Labs Lab 10/31/14 1351  NA 136  K 4.1  CL 104  CO2 19  GLUCOSE 76  BUN 13  CREATININE 0.83  CALCIUM 9.7   Liver Function Tests: No results for input(s): AST, ALT, ALKPHOS, BILITOT, PROT, ALBUMIN in the last 168 hours. No results for input(s): LIPASE, AMYLASE in the last 168 hours. No results for input(s): AMMONIA in the last 168 hours. CBC:  Recent Labs Lab 10/31/14 1351  WBC 7.4  HGB 14.5  HCT 43.1  MCV 84.7  PLT 237   Cardiac Enzymes: No results for input(s): CKTOTAL, CKMB, CKMBINDEX, TROPONINI in the last 168 hours. BNP: Invalid input(s): POCBNP CBG: No results for input(s): GLUCAP in the last 168 hours.  Radiological Exams on Admission: Dg Chest 2 View  10/31/2014   CLINICAL DATA:  Chest pain.  EXAM: CHEST  2 VIEW  COMPARISON:  06/06/2014 02/20/2010  FINDINGS: Mediastinum hilar structures normal. Heart size normal. No focal alveolar infiltrate. No pleural effusion or pneumothorax. No acute bony abnormality.  IMPRESSION: No acute cardiopulmonary disease.  Stable chest.   Electronically Signed   By: Marcello Moores  Register   On: 10/31/2014 14:36    EKG:  none sinus rhythm, ST flattening in inferior leads   Assessment/Plan   Principal Problem:   Chest pain at rest Admit under observation to telemetry. Her chest and symptoms are concerning with new pain symptoms and associated comorbidities. Heart score is 5. -Continue full dose aspirin, sublingual nitrate  when necessary for chest pain symptoms. -Cycle serial troponins, EKG a.m. Recent 2-D echo without wall motion abnormality. -Will place her on low-dose metoprolol.  -Cardiology consulted for evaluation. She will likely need inpatient stress test . -Check lipid panel in a.m.   Active Problems:  uncontrolled type 2 diabetes mellitus  on high-dose Lantus and pre-meal aspart which will be continued. Monitor on sliding scale insulin. Hold Metformin. Switch januvia to tradjenta the hospital .   essential hypertension  Blood pressure low normal in the ED improved after IV fluids.  hold HCTZ and lisinopril. Will place on low-dose metoprolol  Obstructive sleep apnea On nighttime  CPAP was really continued. Follows with Dr. showed as outpatient  GERD Continue Pepcid and protonix   Morbid obesity  Anxiety and depression Resume home medications        Diet:cardiac/ diabetic   DVT prophylaxis: sq lovenox Code Status: Full code  Family Communication: None at bedside Disposition Plan: admit under observation to telemetry  Tiandre Teall, [place] [facility] Pager [phone]  Total time spent on admission :45inutes  If 7PM-7AM, please contact night-coverage [email or web address] Password [facility] 10/31/2014, 4:21 PM

## 2014-10-31 NOTE — Consult Note (Signed)
CARDIOLOGY CONSULT NOTE   Patient ID: Tracy [name] MRN: [id number], DOB/AGE: 1965/12/20   Admit date: 10/31/2014 Date of Consult: 10/31/2014   Primary Physician: Rachell Cipro, MD Primary Cardiologist: Previously seen by Dr. Johnsie Cancel  Pt. Profile   morbidly obese 49 year old female with past medical history of severe anxiety/depression, HTN, HLD, DM, recently diagnosed OSA on CPAP, hypothyroidism and chronic chest pain present with persistent nonexertional CP for 4 days  Problem List  Past Medical History  Diagnosis Date  . PCOS (polycystic ovarian syndrome)   . History of migraine headaches   . Hypothyroidism   . GERD (gastroesophageal reflux disease)   . History of kidney stones   . Arthritis     back, knees, right elbow  . Anxiety   . High cholesterol     no current med.  . Hypertension     under control with meds., has been on med. x 4 yr.  . Depression   . Achilles tendonitis 02/2013    right  . Acquired Haglund's deformity of right heel 02/2013  . 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 Insulin resistance     does not check blood sugar at home  . History of electroconvulsive therapy   . Dental crowns present   . Diabetes mellitus without complication   . PONV (postoperative nausea and vomiting)     also hx. of emergence delirium 2007    Past Surgical History  Procedure Laterality Date  . Appendectomy    . Pilonidal cyst excision  1994  . Total thyroidectomy  2003  . Ovarian cyst surgery Left 1998  . Lumbar laminectomy      X 3  . Achilles tendon surgery Left 2007  . Excision haglund's deformity with achilles tendon repair Right 02/25/2013    Procedure: RIGHT ACHILLES DEBRIDEMENT AND RECONSTRUCTION;  HAGLUND'S EXCISION; GASTROC RECESSION AND FLEXOR HALLUCIS LONGUS TRANSFER;  Surgeon: Wylene Simmer, MD;  Location: [facility];  Service: Orthopedics;  Laterality: Right;  . Tubal ligation Right 1984  . Esophagogastroduodenoscopy (egd)  with propofol N/A 04/20/2014    Procedure: ESOPHAGOGASTRODUODENOSCOPY (EGD) WITH PROPOFOL;  Surgeon: Arta Silence, MD;  Location: [facility];  Service: Endoscopy;  Laterality: N/A;  . Colonoscopy with propofol N/A 04/20/2014    Procedure: COLONOSCOPY WITH PROPOFOL;  Surgeon: Arta Silence, MD;  Location: [facility];  Service: Endoscopy;  Laterality: N/A;     Allergies  Allergies  Allergen Reactions  . Sulfa Antibiotics Itching, Swelling and Rash  . Bupropion Rash  . Bupropion Hcl Itching and Rash  . Codeine Itching  . Sulfamethoxazole-Trimethoprim Rash    HPI   The patient is a morbidly obese 49 year old female with past medical history of severe anxiety/depression, HTN, HLD, DM, recently diagnosed OSA on CPAP, hypothyroidism and chronic chest pain. According to the patient, she had a Myoview done by Dr. Tamala Julian roughly 12 years ago and told the result was normal. She has been having intermittent chest pain for the past several years which was felt to be anxiety related. She was recently seen in [facility] ED for atypical chest pain and was diagnosed with pericarditis and placed on a short course of colchicine and NSAID. In Nov 2015, she was referred to Dr. Johnsie Cancel for further evaluation of atypical chest pain. 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 She was felt to have symptom associated with obstructive sleep apnea and was referred to  pulmonology service. She had a sleep study which showed she has significant obstructive sleep apnea and was placed on CPAP. She was also recently diagnosed with non-alcoholic fatty liver disease and chronic liver disease and currently pending further workup by hepatologist in [facility]. As part of her cardiac assessment, outpatient echocardiogram and MRI was obtained. Unfortunately patient's body habitus is too big to be fitted under MRI machine, therefore only echocardiogram was eventually  obtained. Echo on 07/19/2014 showed EF 09-47%, grade 1 diastolic dysfunction, no pericardial effusion.   Patient presented to [facility] on 10/23/2014 with four-day onset of persistent chest pain.  She works as a Marine scientist in [employer]. She denies any exertional type of chest pain, most of her chest pain has been occurring at rest. She also endorsed left-sided substernal pain radiating to the left neck and left arm. The chest discomfort also radiated to the back toward the shoulder blade. She endorses some shortness of breath. She denies any recent fever, chill, lower extremity edema, orthopnea or paroxysmal nocturnal dyspnea. She did have some mild increase in anxiety recently as her husband just found a job in another city. On arrival, her blood pressure is well-controlled at 116/79. O2 saturation 97% on room air. Chest x-ray showed no acute illness. EKG did not reveal any ischemic changes. Cardiology has been consulted for atypical chest pain.    Inpatient Medications  . [START ON 11/01/2014] aspirin EC  325 mg Oral Daily  . colchicine  0.6 mg Oral BID  . DEPLIN 15  1 capsule Oral Daily  . [START ON 11/01/2014] DULoxetine  60 mg Oral q morning - 10a  . enoxaparin (LOVENOX) injection  40 mg Subcutaneous Q24H  . famotidine  20 mg Oral BID  . [START ON 11/01/2014] hydrochlorothiazide  12.5 mg Oral Daily  . [START ON 11/01/2014] insulin aspart  0-20 Units Subcutaneous TID WC  . insulin aspart  0-5 Units Subcutaneous QHS  . insulin glargine  50 Units Subcutaneous QHS  . [START ON 11/01/2014] levothyroxine  300 mcg Oral QAC breakfast  . linagliptin  5 mg Oral Daily  . [START ON 11/01/2014] lisinopril  10 mg Oral Daily  . metoprolol tartrate  25 mg Oral BID  . [START ON 11/01/2014] pantoprazole  40 mg Oral Daily  . Vortioxetine HBr  20 mg Oral Daily    Family History Family History  Problem Relation Age of Onset  . Breast cancer Mother   . Hypertension Mother   . Heart disease Father    . Depression Father   . Cancer Brother     THROAT  . Depression Brother   . Anxiety disorder Brother   . Depression Sister      Social History History   Social History  . Marital Status: Married    Spouse Name: N/A  . Number of Children: N/A  . Years of Education: N/A   Occupational History  . RN- PICU    Social History Main Topics  . Smoking status: Former Smoker -- 0.50 packs/day for 11 years    Types: Cigarettes    Quit date: 08/05/2013  . Smokeless tobacco: Never Used  . Alcohol Use: No  . Drug Use: No  . Sexual Activity: No   Other Topics Concern  . Not on file   Social History Narrative   RN - [employer]   Regular exercise: no   Caffeine use: 2 x daily     Review of Systems  General:  No  chills, fever, night sweats or weight changes.  Cardiovascular:  No dyspnea on exertion, edema, orthopnea, palpitations, paroxysmal nocturnal dyspnea. +chest pain Dermatological: No rash, lesions/masses Respiratory: No cough +dyspnea Urologic: No hematuria, dysuria Abdominal:   No nausea, vomiting, diarrhea, bright red blood per rectum, melena, or hematemesis Neurologic:  No visual changes, wkns, changes in mental status. All other systems reviewed and are otherwise negative except as noted above.  Physical Exam  Blood pressure 110/64, pulse 72, temperature 98.1 F (36.7 C), resp. rate 20, height 5\' 11"  (1.803 m), weight 313 lb 11.4 oz (142.3 kg), last menstrual period 09/18/2014, SpO2 100 %.  General: Pleasant. Appear to be uncomfortable. Psych: Normal affect. Neuro: Alert and oriented X 3. Moves all extremities spontaneously. HEENT: Normal  Neck: Supple without bruits or JVD. Lungs:  Resp regular and unlabored, CTA. Heart: RRR no s3, s4, or murmurs. Abdomen: Soft, non-tender, non-distended, BS + x 4.  Extremities: No clubbing, cyanosis or edema. DP/PT/Radials 2+ and equal bilaterally.  Labs  No results for input(s): CKTOTAL, CKMB, TROPONINI in the last 72  hours. Lab Results  Component Value Date   WBC 7.4 10/31/2014   HGB 14.5 10/31/2014   HCT 43.1 10/31/2014   MCV 84.7 10/31/2014   PLT 237 10/31/2014    Recent Labs Lab 10/31/14 1351  NA 136  K 4.1  CL 104  CO2 19  BUN 13  CREATININE 0.83  CALCIUM 9.7  GLUCOSE 76   No results found for: CHOL, HDL, LDLCALC, TRIG No results found for: DDIMER  Radiology/Studies  Dg Chest 2 View  10/31/2014   CLINICAL DATA:  Chest pain.  EXAM: CHEST  2 VIEW  COMPARISON:  06/06/2014 02/20/2010  FINDINGS: Mediastinum hilar structures normal. Heart size normal. No focal alveolar infiltrate. No pleural effusion or pneumothorax. No acute bony abnormality.  IMPRESSION: No acute cardiopulmonary disease.  Stable chest.   Electronically Signed   By: Marcello Moores  Register   On: 10/31/2014 14:36    ECG  NSR without significant ST-T wave changes  ASSESSMENT AND PLAN  1. Atypical chest pain   - trop negative after persistent CP for 4 days, EKG shows no acute ischemic changes. Pulse equal bilaterally, BP normal, aortic dissection very unlikely. No tachycardia, making PE unlikely as well, will defer to primary team whether to pursue DVT/PE workup  - recent echo with normal EF reassuring. Weight 313 lbs really make myoview less ideal and not accurate  - discussed with MD, agree with Dr. Kyla Balzarine prior assessment, no objective finding of ischemia, would trend troponin, if negative would not pursue further ischemic testing   2. severe anxiety/depression 3. HTN 4. HLD 5. DM 6. recently diagnosed OSA on CPAP 7. Hepatic dx with gastric varices per pt: pending workup at [facility] 8. hypothyroidism    Signed, Almyra Deforest, PA-C 10/31/2014, 5:05 PM  Personally seen and examined. Agree with above. Atypical chest pain, constant with severe anxiety/depression. Exam noteworthy for tearfulness, RRR, CTAB, hirsutism.  Troponin normal in the setting of constant pain.  She notes that she has not been taking PPI - question  possible GI source. Could be pain associated with depression as well.  ECG normal. ECHO normal. If next troponin is normal, no further ischemic evaluation. Non exertional.   OK with DC from cardiac perspective if second troponin is normal.   Candee Furbish, MD

## 2014-11-01 DIAGNOSIS — G4733 Obstructive sleep apnea (adult) (pediatric): Secondary | ICD-10-CM

## 2014-11-01 DIAGNOSIS — E1165 Type 2 diabetes mellitus with hyperglycemia: Secondary | ICD-10-CM | POA: Diagnosis not present

## 2014-11-01 DIAGNOSIS — F418 Other specified anxiety disorders: Secondary | ICD-10-CM | POA: Diagnosis not present

## 2014-11-01 DIAGNOSIS — R079 Chest pain, unspecified: Secondary | ICD-10-CM | POA: Diagnosis not present

## 2014-11-01 DIAGNOSIS — I10 Essential (primary) hypertension: Secondary | ICD-10-CM | POA: Diagnosis not present

## 2014-11-01 LAB — GLUCOSE, CAPILLARY
Glucose-Capillary: 103 mg/dL — ABNORMAL HIGH (ref 70–99)
Glucose-Capillary: 125 mg/dL — ABNORMAL HIGH (ref 70–99)

## 2014-11-01 LAB — TROPONIN I: Troponin I: <0.03 ng/mL (ref <0.031)

## 2014-11-01 MED ORDER — RANITIDINE HCL 150 MG PO TABS: 150.0000 mg | ORAL_TABLET | Freq: Every day | ORAL | Status: DC

## 2014-11-01 MED ORDER — GI COCKTAIL ~~LOC~~: 30.0000 mL | Freq: Two times a day (BID) | ORAL | Status: DC | PRN

## 2014-11-01 NOTE — Progress Notes (Signed)
Patient discharged to home. Discharge instructions given, patient verbalized understanding of instructions given. Patient walked out to private vehicle, refused wheelchair transport. Afleming, RN

## 2014-11-01 NOTE — Progress Notes (Signed)
Pt takes 40 units long-acting insulin at home, [facility] has 50 units lantus scheduled, pt comfortable with taking 40 units of lantus, MD notified, pt given 40 units lantus, pt call bell within reach, bed in lowest position, RN will continue to hourly round on pt.   Fulton Mole, RN 10/31/14

## 2014-11-01 NOTE — Discharge Summary (Signed)
Physician Discharge Summary  Tracy [name] DTO:[id number] DOB: 23-Aug-1965 DOA: 10/31/2014  PCP: Rachell Cipro, MD  Admit date: 10/31/2014 Discharge date: 11/01/2014  Time spent: 35 minutes  Recommendations for Outpatient Follow-up:  Patient will be discharged home. She should follow-up with her primary care physician within one week of discharge. Patient should also follow-up with her gastroenterologist. Patient to continue her medications as prescribed. Patient should resume a heart healthy/carb modified diet. Patient may resume activity as tolerated.   Discharge Diagnoses:  Atypical chest pain  Diabetes mellitus, type II Essential hypertension Obstructive sleep apnea GERD/portal hypertension, esophageal varices Morbid obesity Depression and anxiety Hypothyroidism  Discharge Condition: Stable  Diet recommendation:  heart healthy/carb modified diet  Filed Weights   10/31/14 1407 10/31/14 1703  Weight: 141.069 kg (311 lb) 142.3 kg (313 lb 11.4 oz)    History of present illness:  On 10/31/2014 by Dr. Flonnie Overman Dhungel 49 year old morbidly obese female with history of cystic ovarian disease, hypothyroidism, OSA on CPAP, hypertension, hyperlipidemia, uncontrolled type 2 diabetes mellitus, chronic migraine, anxiety and depression, GERD, who presented to the ED with left-sided chest pain for 4 days worsened since this morning. Patient reports having off-and-on left-sided chest pain for the past 4 days. Patient reports having long-standing chest pain of similar nature for past several months. However this morning the pain was worse on the left side, 8/10 in severity and then started radiating to the neck and her left arm. she reports this type of pain symptom to be new. She took some Motrin without much relief. Denies any aggravating or relieving factors. Denies any chest trauma or lifting the object. Denies any shortness of breath, outpatient dictations, orthopnea or  PND. Patient denies headache, dizziness, fever, chills, nausea , vomiting, palpitations, SOB, abdominal pain, bowel or urinary symptoms. Denies change in weight or appetite. Course in the ED Patient was mild hypertensive with systolic blood pressure in the 90s, remaining vitals were stable. Patient was given 3 tablets of 81 mg aspirin and sublingual nitrate following which her chest pain symptoms improved. He was also given 1 L IV normal saline bolus. Blood work done showed normal CBC and chemistry. Initial troponin was negative. EKG done showed sinus rhythm with ST flattening in inferior leads. Hospitalist admission requested to telemetry.  Hospital Course:  Atypical Chest pain -patient has been experiencing chest pain for approximately 4 days, her troponins were cycled and found to be negative  -EKG showed no acute ischemic changes  -Patient denied any shortness of breath or tachycardia, making pulmonary embolism unlikely  -Recent echocardiogram  07/19/2014: EF 9833%, grade 1 diastolic dysfunction -Cardiology consulted and appreciated, that pain could be related to GI source as well as depression, no further evaluation needed as troponins have been normal   Diabetes mellitus, type II, uncontrolled -Metformin initially held but may resume on discharge. -Continue Lantus and other home medications upon discharge -Last hemoglobin A1c on 08/25/2014 7.7  Essential hypertension -Resume home medications   Obstructive sleep apnea -Continue CPAP  GERD/portal hypertension, esophageal varices -Continue Pepcid -Follow up with hepatologist  Morbid obesity -Discussed lifestyle modifications with primary care physician upon discharge  Anxiety and depression -Resemble medications  Hypothyroidism -Continue Synthroid  Procedures: None  Consultations: Cardiology   Discharge Exam: Filed Vitals:   11/01/14 1016  BP: 113/64  Pulse: 65  Temp:   Resp:      General: Well developed,  well nourished, NAD, appears stated age  HEENT: NCAT, mucous membranes moist. Hirsutism  Cardiovascular: S1 S2 auscultated,  no rubs, murmurs or gallops. Regular rate and rhythm.  Respiratory: Clear to auscultation bilaterally with equal chest rise  Abdomen: Soft, obese, nontender, nondistended, + bowel sounds  Extremities: warm dry without cyanosis clubbing or edema  Neuro: AAOx3, nonfocal  Psych: Normal affect and demeanor   Discharge Instructions      Discharge Instructions    Discharge instructions    Complete by:  As directed   Patient will be discharged home. She should follow-up with her primary care physician within one week of discharge. Patient should also follow-up with her gastroenterologist. Patient to continue her medications as prescribed. Patient should resume a heart healthy/carb modified diet. Patient may resume activity as tolerated.            Medication List    STOP taking these medications        promethazine 25 MG tablet  Commonly known as:  PHENERGAN      TAKE these medications        ALPRAZolam 0.5 MG tablet  Commonly known as:  XANAX  Take 0.5 mg by mouth at bedtime as needed for anxiety.     BRINTELLIX 10 MG Tabs  Generic drug:  Vortioxetine HBr  Take 20 mg by mouth daily.     DEPLIN 15 15-90.314 MG Caps  Take 1 capsule by mouth daily.     glucose blood test strip  Commonly known as:  ONETOUCH VERIO  Use to test blood sugar 3 times daily as instructed.     insulin aspart 100 UNIT/ML FlexPen  Commonly known as:  NOVOLOG  Inject 18 Units into the skin 3 (three) times daily with meals.     Insulin Pen Needle 32G X 4 MM Misc  Use to inject insulin 3 times daily as instructed.     INSULIN SYRINGE .5CC/31GX5/16" 31G X 5/16" 0.5 ML Misc  Use to inject insulin 1 time daily as instructed.     levothyroxine 200 MCG tablet  Commonly known as:  SYNTHROID, LEVOTHROID  Take 1.5 tablets (300 mcg total) by mouth daily before breakfast.      lisinopril-hydrochlorothiazide 10-12.5 MG per tablet  Commonly known as:  PRINZIDE,ZESTORETIC  Take 1 tablet by mouth every morning.     metFORMIN 1000 MG (MOD) 24 hr tablet  Commonly known as:  GLUMETZA  Take 2,000 mg by mouth 2 (two) times daily with a meal.     naproxen sodium 220 MG tablet  Commonly known as:  ANAPROX  Take 440 mg by mouth daily as needed (pain).     ranitidine 150 MG tablet  Commonly known as:  ZANTAC  Take 1 tablet (150 mg total) by mouth at bedtime.     sitaGLIPtin 100 MG tablet  Commonly known as:  JANUVIA  Take 100 mg by mouth daily.     TOUJEO SOLOSTAR 300 UNIT/ML Sopn  Generic drug:  Insulin Glargine  Inject 40 Units into the skin daily at 10 pm.       Allergies  Allergen Reactions  . Sulfa Antibiotics Itching, Swelling and Rash  . Bupropion Rash  . Bupropion Hcl Itching and Rash  . Codeine Itching  . Sulfamethoxazole-Trimethoprim Rash   Follow-up Information    Follow up with [facility], MD. Schedule an appointment as soon as possible for a visit in 1 week.   Specialty:  Family Medicine   Why:  Hospital follow-up   Contact information:   [address] [place] [address] [phone]  The results of significant diagnostics from this hospitalization (including imaging, microbiology, ancillary and laboratory) are listed below for reference.    Significant Diagnostic Studies: Dg Chest 2 View  10/31/2014   CLINICAL DATA:  Chest pain.  EXAM: CHEST  2 VIEW  COMPARISON:  06/06/2014 02/20/2010  FINDINGS: Mediastinum hilar structures normal. Heart size normal. No focal alveolar infiltrate. No pleural effusion or pneumothorax. No acute bony abnormality.  IMPRESSION: No acute cardiopulmonary disease.  Stable chest.   Electronically Signed   By: Marcello Moores  Register   On: 10/31/2014 14:36    Microbiology: No results found for this or any previous visit (from the past 240 hour(s)).   Labs: Basic Metabolic Panel:  Recent  Labs Lab 10/31/14 1351  NA 136  K 4.1  CL 104  CO2 19  GLUCOSE 76  BUN 13  CREATININE 0.83  CALCIUM 9.7   Liver Function Tests: No results for input(s): AST, ALT, ALKPHOS, BILITOT, PROT, ALBUMIN in the last 168 hours. No results for input(s): LIPASE, AMYLASE in the last 168 hours. No results for input(s): AMMONIA in the last 168 hours. CBC:  Recent Labs Lab 10/31/14 1351  WBC 7.4  HGB 14.5  HCT 43.1  MCV 84.7  PLT 237   Cardiac Enzymes:  Recent Labs Lab 10/31/14 1823 10/31/14 2153 11/01/14 0421  TROPONINI <0.03 <0.03 <0.03   BNP: BNP (last 3 results) No results for input(s): BNP in the last 8760 hours.  ProBNP (last 3 results) No results for input(s): PROBNP in the last 8760 hours.  CBG:  Recent Labs Lab 10/31/14 1726 10/31/14 2115 11/01/14 0624  GLUCAP 87 103* 125*       Signed:  Cristal Ford  [facility] 11/01/2014, 10:38 AM

## 2014-11-01 NOTE — Progress Notes (Signed)
UR completed 

## 2014-11-01 NOTE — Progress Notes (Signed)
Pt complains of chest pain, pt given nitroglycerin x2 with no relief, last troponin negative, pt request gi cocktail, MD notified, MD ordered gi cocktail. Gi cocktail effective, pt resting, pt bed in lowest position, call bell within reach.   Fulton Mole, RN 10/31/14

## 2014-11-01 NOTE — Discharge Instructions (Signed)

## 2014-11-09 ENCOUNTER — Telehealth: Payer: Self-pay | Admitting: Internal Medicine

## 2014-11-09 NOTE — Telephone Encounter (Signed)
What time of the day is the blood sugar getting low.  She may need to reduce her insulin depending on this information

## 2014-11-09 NOTE — Telephone Encounter (Signed)
Dont take Januvia or Metformin till am when DR G can look at this

## 2014-11-09 NOTE — Telephone Encounter (Signed)
Hasn't taken insulin in at least a week is still taking Metformin and januvia still but her bs levels are still low into the 40s please advise

## 2014-11-09 NOTE — Telephone Encounter (Signed)
Pt stated her blood sugar is dropping low between lunch and 2pm. I confirmed with pt, she has currently stopped taking her insulin for the last week. Pt is only taking metformin at this time.

## 2014-11-09 NOTE — Telephone Encounter (Signed)
Returned pt's call. Pt said that atleast 3 to 4 times in the last week her blood sugars have dropped below 60. She is doing all that she is suppose to and her blood sugars are still low for her. Pt wants to know what she can reduce her Metformin to, to help combat the lows. Please advise in Dr Arman Filter absence. Please message Megan back.

## 2014-11-10 NOTE — Telephone Encounter (Signed)
See note below from Dr. Dwyane Dee. Pt was advised of instructions yesterday at (530pm)  Please advise on further instructions. Thanks!

## 2014-11-10 NOTE — Telephone Encounter (Signed)
Called pt and was finally able to lvm. Advised pt per Dr Arman Filter message below. Advised pt to call with any sugars that are not controlled, too high or low.

## 2014-11-10 NOTE — Telephone Encounter (Signed)
Agree with stopping Januvia. Try to take 500 mg Metformin 2x a day and check sugars. Let us know about sugars on Monday or sooner if not controlled (either high or low).

## 2014-11-10 NOTE — Telephone Encounter (Signed)
Have tried calling pt multiple times this AM. Will keep trying to advise pt of Dr Arman Filter medication instructions.

## 2014-11-15 ENCOUNTER — Telehealth: Payer: Self-pay | Admitting: *Deleted

## 2014-11-15 NOTE — Telephone Encounter (Signed)
Called pt and advised her per Dr Gherghe's message. Pt voiced understanding.  

## 2014-11-15 NOTE — Telephone Encounter (Signed)
Stop Metformin. Let's have her call back with sugars in few days.

## 2014-11-15 NOTE — Telephone Encounter (Signed)
Pt called stating that her blood sugars are still running really low. Pt stated her blood sugar was 63 yesterday AM. This AM it was 130 fasting. She took her metformin this AM. At 10:55 AM her blood sugar was 54 (she had a good breakfast and drank some soda). Last night her blood sugar was 110, 2 1/2 hrs after dinner. Pt is working and concerned about her blood sugars being so low. Please advise.

## 2014-11-24 ENCOUNTER — Ambulatory Visit: Payer: Self-pay | Admitting: Internal Medicine

## 2014-12-22 ENCOUNTER — Encounter: Payer: Self-pay | Admitting: Pulmonary Disease

## 2014-12-22 ENCOUNTER — Ambulatory Visit (INDEPENDENT_AMBULATORY_CARE_PROVIDER_SITE_OTHER): Payer: 59 | Admitting: Pulmonary Disease

## 2014-12-22 VITALS — BP 118/76 | HR 86 | Ht 71.0 in | Wt 317.4 lb

## 2014-12-22 DIAGNOSIS — G4733 Obstructive sleep apnea (adult) (pediatric): Secondary | ICD-10-CM

## 2014-12-22 NOTE — Progress Notes (Signed)
Chief Complaint  Patient presents with  . Follow-up    Pt states wearing CPAP but not all the time since she sleeps in recliner most nights. Pt states that mask has a tendency of slipping off , but when she wears it she uses it all night.     History of Present Illness: Tracy [name] is a 49 y.o. female with OSA.  Since her last visit she had a home sleep study.  This showed severe sleep apnea.  She has since been started on auto CPAP.  She is sleeping much better.  She feels more alert during the day.  Her only issue with CPAP is that she will fall asleep after working in chair before putting on CPAP.  She was seen at [facility] for evaluation of her liver.  She has NASH and there is concern about cirrhosis >> she had liver bx and is waiting for results.  She has noticed more swelling in her ankles.  She is having a hard time losing weight >> she wants to look into bariatric surgery if she does not have more issues with her liver.  TESTS: HST 08/31/14 >> AHI 72.5 and SaO2 low 73%. Auto CPAP 09/24/14 to 10/23/14 >> used on 12 of 30 nights with average 6 hrs 52 min.  Average AHI 1.9 with median CPAP 9 cm H2O and 95 th percentile CPAP 13 cm H2O.  PMHx >> Migraine headaches, Hypothyroidism, GERD, NASH, HTN, HLD, Anxiety, Depression, DM, Neuropathy PCOS  Past surgical hx, Medications, Allergies, Family hx, Social hx all reviewed.   Physical Exam: Blood pressure 118/76, pulse 86, height 5\' 11"  (1.803 m), weight 317 lb 6.4 oz (143.972 kg), SpO2 96 %. Body mass index is 44.29 kg/(m^2).  General - No distress ENT - No sinus tenderness, no oral exudate, no LAN, MP 3 Cardiac - s1s2 regular, no murmur Chest - No wheeze/rales/dullness Back - No focal tenderness Abd - Soft, non-tender Ext - No edema Neuro - Normal strength Skin - No rashes Psych - normal mood, and behavior   Assessment/Plan:  Obstructive sleep apnea. Plan: - continue with auto CPAP  Obesity. Plan: - discussed  options to assist with weight loss - she will look into bariatric surgery if her liver biopsy is negative for cirrhosis   Chesley Mires, MD [facility] Pager:  [phone]

## 2014-12-22 NOTE — Patient Instructions (Signed)
Follow up in 1 year.

## 2015-01-19 HISTORY — PX: UPPER GI ENDOSCOPY: SHX6162

## 2015-01-30 ENCOUNTER — Other Ambulatory Visit: Payer: Self-pay

## 2015-02-09 ENCOUNTER — Ambulatory Visit (INDEPENDENT_AMBULATORY_CARE_PROVIDER_SITE_OTHER): Payer: 59 | Admitting: "Endocrinology

## 2015-02-09 ENCOUNTER — Encounter: Payer: Self-pay | Admitting: "Endocrinology

## 2015-02-09 DIAGNOSIS — N970 Female infertility associated with anovulation: Secondary | ICD-10-CM

## 2015-02-09 DIAGNOSIS — F418 Other specified anxiety disorders: Secondary | ICD-10-CM

## 2015-02-09 DIAGNOSIS — E8881 Metabolic syndrome: Secondary | ICD-10-CM

## 2015-02-09 DIAGNOSIS — E282 Polycystic ovarian syndrome: Secondary | ICD-10-CM

## 2015-02-09 DIAGNOSIS — K7469 Other cirrhosis of liver: Secondary | ICD-10-CM

## 2015-02-09 DIAGNOSIS — E161 Other hypoglycemia: Secondary | ICD-10-CM

## 2015-02-09 DIAGNOSIS — E890 Postprocedural hypothyroidism: Secondary | ICD-10-CM

## 2015-02-09 DIAGNOSIS — E88819 Insulin resistance, unspecified: Secondary | ICD-10-CM

## 2015-02-09 DIAGNOSIS — F32A Depression, unspecified: Secondary | ICD-10-CM

## 2015-02-09 DIAGNOSIS — L680 Hirsutism: Secondary | ICD-10-CM | POA: Insufficient documentation

## 2015-02-09 DIAGNOSIS — K7460 Unspecified cirrhosis of liver: Secondary | ICD-10-CM | POA: Insufficient documentation

## 2015-02-09 DIAGNOSIS — F419 Anxiety disorder, unspecified: Secondary | ICD-10-CM

## 2015-02-09 DIAGNOSIS — R7309 Other abnormal glucose: Secondary | ICD-10-CM

## 2015-02-09 DIAGNOSIS — L83 Acanthosis nigricans: Secondary | ICD-10-CM

## 2015-02-09 DIAGNOSIS — F329 Major depressive disorder, single episode, unspecified: Secondary | ICD-10-CM

## 2015-02-09 DIAGNOSIS — N915 Oligomenorrhea, unspecified: Secondary | ICD-10-CM

## 2015-02-09 DIAGNOSIS — K760 Fatty (change of) liver, not elsewhere classified: Secondary | ICD-10-CM

## 2015-02-09 DIAGNOSIS — R7303 Prediabetes: Secondary | ICD-10-CM

## 2015-02-09 DIAGNOSIS — I10 Essential (primary) hypertension: Secondary | ICD-10-CM

## 2015-02-09 HISTORY — DX: Hirsutism: L68.0

## 2015-02-09 HISTORY — DX: Other hypoglycemia: E16.1

## 2015-02-09 HISTORY — DX: Acanthosis nigricans: L83

## 2015-02-09 HISTORY — DX: Fatty (change of) liver, not elsewhere classified: K76.0

## 2015-02-09 HISTORY — DX: Metabolic syndrome: E88.81

## 2015-02-09 HISTORY — DX: Female infertility associated with anovulation: N97.0

## 2015-02-09 HISTORY — DX: Insulin resistance, unspecified: E88.819

## 2015-02-09 LAB — POCT GLYCOSYLATED HEMOGLOBIN (HGB A1C): HEMOGLOBIN A1C: 6.2

## 2015-02-09 LAB — GLUCOSE, POCT (MANUAL RESULT ENTRY): POC Glucose: 125 mg/dl — AB (ref 70–99)
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
## 2015-02-09 NOTE — Patient Instructions (Addendum)
Follow up visit in 2 months. Eat Right Diet and [place]. Exercise daily with a goal to gradually exercise  for up to one hour or more per day.

## 2015-03-09 ENCOUNTER — Other Ambulatory Visit: Payer: Self-pay | Admitting: Obstetrics and Gynecology

## 2015-03-09 DIAGNOSIS — N63 Unspecified lump in unspecified breast: Secondary | ICD-10-CM

## 2015-03-16 ENCOUNTER — Ambulatory Visit
Admission: RE | Admit: 2015-03-16 | Discharge: 2015-03-16 | Disposition: A | Discharge status: Home / Self Care | Payer: 59 | Source: Ambulatory Visit | Attending: Obstetrics and Gynecology | Admitting: Obstetrics and Gynecology

## 2015-03-16 DIAGNOSIS — N63 Unspecified lump in unspecified breast: Secondary | ICD-10-CM

## 2015-03-30 ENCOUNTER — Ambulatory Visit (INDEPENDENT_AMBULATORY_CARE_PROVIDER_SITE_OTHER): Payer: 59 | Admitting: Neurology

## 2015-03-30 ENCOUNTER — Encounter: Payer: Self-pay | Admitting: Neurology

## 2015-03-30 VITALS — BP 134/88 | HR 80 | Ht 71.0 in | Wt 310.0 lb

## 2015-03-30 DIAGNOSIS — R202 Paresthesia of skin: Secondary | ICD-10-CM | POA: Diagnosis not present

## 2015-03-30 DIAGNOSIS — R7309 Other abnormal glucose: Secondary | ICD-10-CM | POA: Diagnosis not present

## 2015-03-30 DIAGNOSIS — G500 Trigeminal neuralgia: Secondary | ICD-10-CM

## 2015-03-30 DIAGNOSIS — R7303 Prediabetes: Secondary | ICD-10-CM

## 2015-03-30 DIAGNOSIS — M542 Cervicalgia: Secondary | ICD-10-CM

## 2015-03-30 MED ORDER — GABAPENTIN 100 MG PO CAPS: ORAL_CAPSULE | ORAL | Status: DC

## 2015-03-30 NOTE — Progress Notes (Signed)
PATIENT: Tracy [name] DOB: [birth year]  Chief Complaint  Patient presents with  . Trigeminal Neuralgia    She has been having right sided facial and ear pain. She was initially diagnosed in 2003 and her symptoms lasted 2.5 years.  Her pain started again in April 2016 and has continued to worsen.  . Neck Pain    She has neck pain that radiates into her right shoulder. Also, having bilateral hand numbness/tingling and an increase in headaches.  . Cirrhosis    She has recently been diagnosed with non-alcoholic fatty liver disease.     HISTORICAL  Tracy [name] is a 49 year old right-handed female, seen in refer by her primary care physician Dr. Rachell Cipro for evaluation of right facial pain, right neck pain, radiating pain to right shoulder and arm.  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 She was under the care of Dr. Melton Alar then, she also had a frequent migraine at that time.  Since April 2016, she began to notice recurrent similar right facial pain, transient sharp radiating pain from right temporal region to her right nasal area, triggered by wind blow on her face, touching her face, present 25-50% time of the day, she has tried Ultram, Vicodin without helping, end of July to early August, she got 2 weeks course of prednisone package without helping her pain  She also complains of chronic right-sided neck pain, radiating pain to her right shoulder, right  deltoid region, getting worse over the past few months, difficulty sleeping on her right side, subjective weakness of her right hand, also complains of worsening bilateral hands paresthesia, right worse than left.  She denies bilateral feet paresthesia, no gait difficulty.   REVIEW OF SYSTEMS: Full 14 system review of systems performed and notable only for fatigue, swelling in legs, blurry vision, eye pain, snoring, constipation, easy bruising, feeling hot, flushing, achy muscles, allergy, headaches, numbness, weakness, dizziness, insomnia, snoring, shift work, depression, anxiety, not enough sleep, decreased energy, change in appetite, racing thoughts.  ALLERGIES: Allergies  Allergen Reactions  . Sulfa Antibiotics Itching, Swelling and Rash  . Bupropion Rash  . Bupropion Hcl Itching and Rash  . Codeine Itching  . Sulfamethoxazole-Trimethoprim Rash    HOME MEDICATIONS: Current Outpatient Prescriptions  Medication Sig Dispense Refill  . ALPRAZolam (XANAX) 0.5 MG tablet Take 0.5 mg by mouth at bedtime as needed for anxiety.    . DULoxetine (CYMBALTA) 60 MG capsule Take 120 mg by mouth daily.    Marland Kitchen levothyroxine (SYNTHROID, LEVOTHROID) 200 MCG tablet Take 1.5 tablets (300 mcg total) by mouth daily before breakfast. 135 tablet 1  . lisinopril-hydrochlorothiazide (PRINZIDE,ZESTORETIC) 10-12.5 MG per tablet Take 1 tablet by mouth every morning.     . naproxen sodium (ANAPROX) 220 MG tablet Take 440 mg by mouth daily as needed (pain).    . ranitidine (ZANTAC) 150 MG tablet Take 150 mg by mouth as needed for heartburn.     No current facility-administered medications for this visit.    PAST MEDICAL HISTORY: Past Medical History  Diagnosis Date  .  PCOS (polycystic ovarian syndrome)   . History of migraine headaches   . Hypothyroidism   . GERD (gastroesophageal reflux disease)   . History of kidney stones   . Arthritis     back, knees, right elbow  . Anxiety   . High cholesterol     no  current med.  . Hypertension     under control with meds., has been on med. x 4 yr.  . Depression   . Achilles tendonitis 02/2013    right  . Acquired Haglund's deformity of right heel 02/2013  . 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 Insulin resistance     does not check blood sugar at home  . History of electroconvulsive therapy   . Dental crowns present   . Diabetes mellitus without complication   . PONV (postoperative nausea and vomiting)     also hx. of emergence delirium 2007  . Cirrhosis of liver   . Trigeminal neuralgia   . Neck pain     PAST SURGICAL HISTORY: Past Surgical History  Procedure Laterality Date  . Appendectomy    . Pilonidal cyst excision  1994  . Total thyroidectomy  2003  . Ovarian cyst surgery Left 1998  . Lumbar laminectomy      X 3  . Achilles tendon surgery Left 2007  . Excision haglund's deformity with achilles tendon repair Right 02/25/2013    Procedure: RIGHT ACHILLES DEBRIDEMENT AND RECONSTRUCTION;  HAGLUND'S EXCISION; GASTROC RECESSION AND FLEXOR HALLUCIS LONGUS TRANSFER;  Surgeon: Wylene Simmer, MD;  Location: [facility];  Service: Orthopedics;  Laterality: Right;  . Esophagogastroduodenoscopy (egd) with propofol N/A 04/20/2014    Procedure: ESOPHAGOGASTRODUODENOSCOPY (EGD) WITH PROPOFOL;  Surgeon: Arta Silence, MD;  Location: [facility];  Service: Endoscopy;  Laterality: N/A;  . Colonoscopy with propofol N/A 04/20/2014    Procedure: COLONOSCOPY WITH PROPOFOL;  Surgeon: Arta Silence, MD;  Location: [facility];  Service: Endoscopy;  Laterality: N/A;    FAMILY HISTORY: Family History  Problem Relation Age of Onset  . Breast cancer Mother   . Hypertension Mother   . Heart disease Father   . Depression Father   . Cancer Brother     THROAT  . Depression Brother   . Anxiety disorder Brother   . Depression Sister     SOCIAL HISTORY:  Social History   Social History  . Marital Status: Married    Spouse Name: N/A  .  Number of Children: 0  . Years of Education: 16   Occupational History  . RN- PICU    Social History Main Topics  . Smoking status: Current Some Day Smoker -- 0.50 packs/day for 11 years    Types: Cigarettes    Last Attempt to Quit: 08/05/2013  . Smokeless tobacco: Never Used     Comment: Smokes cigarettes occasionally  . Alcohol Use: No     Comment: Has not had any alcohol since 07/2014 - before this date, she rarely drink.  . Drug Use: No  . Sexual Activity: No   Other Topics Concern  . Not on file   Social History Narrative   RN - [employer]   Regular exercise: no   Caffeine use: 2 x daily   Right-handed.   Lives alone.     PHYSICAL EXAM   Filed Vitals:   03/30/15 1025  BP: 134/88  Pulse: 80  Height: 5\' 11"  (1.803 m)  Weight: 310 lb (140.615 kg)    Not recorded  Body mass index is 43.26 kg/(m^2).  PHYSICAL EXAMNIATION:  Gen: NAD, conversant, well nourised, obese, well groomed                     Cardiovascular: Regular rate rhythm, no peripheral edema, warm, nontender. Eyes: Conjunctivae clear without exudates or hemorrhage Neck: Supple, no carotid bruise. Pulmonary: Clear to auscultation bilaterally   NEUROLOGICAL EXAM:  MENTAL STATUS: Speech:    Speech is normal; fluent and spontaneous with normal comprehension.  Cognition:     Orientation to time, place and person     Normal recent and remote memory     Normal Attention span and concentration     Normal Language, naming, repeating,spontaneous speech     Fund of knowledge   CRANIAL NERVES: CN II: Visual fields are full to confrontation. Fundoscopic exam is normal with sharp discs and no vascular changes. Pupils are round equal and briskly reactive to light. CN III, IV, VI: extraocular movement are normal. No ptosis. CN V: Facial sensation is intact to pinprick in all 3 divisions bilaterally. Corneal responses are intact.  CN VII: Face is symmetric with normal eye closure and smile. CN  VIII: Hearing is normal to rubbing fingers CN IX, X: Palate elevates symmetrically. Phonation is normal. CN XI: Head turning and shoulder shrug are intact CN XII: Tongue is midline with normal movements and no atrophy.  MOTOR: There is no pronator drift of out-stretched arms. Muscle bulk and tone are normal. Muscle strength is normal.  REFLEXES: Reflexes are 2+ and symmetric at the biceps, triceps, knees, and ankles. Plantar responses are flexor.  SENSORY: Intact to light touch, pinprick, position sense, and vibration sense are intact in fingers and toes.  COORDINATION: Rapid alternating movements and fine finger movements are intact. There is no dysmetria on finger-to-nose and heel-knee-shin.    GAIT/STANCE: Need to push up to get up from seated position, mild unsteady, due to knee pain, antalgic gait, also limited by her big body habitus  DIAGNOSTIC DATA (LABS, IMAGING, TESTING) - I reviewed patient records, labs, notes, testing and imaging myself where available.   ASSESSMENT AND PLAN  Tracy [name] is a 49 y.o. female with past medical history of polycystic ovarian disease, obesity, depression anxiety, type 2 diabetes A1c was 6.2 to 6.6, she presented with recurrent right facial pain, involving right V2 distribution since April 2016, also complains of chronic neck pain, radiating pain to right shoulder, right arm, bilateral hands paresthesia  Right trigeminal neuralgia   MRI of the brain with and without contrast  Neurontin 100 mg tablets, titrating to [phone] daily Right neck pain, radiating pain to right shoulder  Suggestive of right cervical radiculopathy Bilateral hands paresthesia  Possible bilateral carpal tunnel syndromes Type 2 diabetes   Tracy [name], M.D. Ph.D.  [facility] [address] [place], [place] [address] Ph: [phone] Fax: [phone]  CC: Dr. Rachell Cipro

## 2015-04-06 ENCOUNTER — Ambulatory Visit (HOSPITAL_COMMUNITY)
Admission: RE | Admit: 2015-04-06 | Discharge: 2015-04-06 | Disposition: A | Discharge status: Home / Self Care | Payer: 59 | Source: Ambulatory Visit | Attending: Neurology | Admitting: Neurology

## 2015-04-06 ENCOUNTER — Other Ambulatory Visit: Payer: Self-pay | Admitting: Neurology

## 2015-04-06 ENCOUNTER — Telehealth: Payer: Self-pay | Admitting: *Deleted

## 2015-04-06 DIAGNOSIS — R202 Paresthesia of skin: Secondary | ICD-10-CM | POA: Insufficient documentation

## 2015-04-06 DIAGNOSIS — G500 Trigeminal neuralgia: Secondary | ICD-10-CM

## 2015-04-06 DIAGNOSIS — M542 Cervicalgia: Secondary | ICD-10-CM | POA: Diagnosis not present

## 2015-04-06 DIAGNOSIS — R7303 Prediabetes: Secondary | ICD-10-CM

## 2015-04-06 DIAGNOSIS — R7309 Other abnormal glucose: Secondary | ICD-10-CM | POA: Diagnosis not present

## 2015-04-06 MED ORDER — GADOBENATE DIMEGLUMINE 529 MG/ML IV SOLN
15.0000 mL | Freq: Once | INTRAVENOUS | Status: AC | PRN
  Administered 2015-04-06: 15 mL via INTRAVENOUS

## 2015-04-06 NOTE — Telephone Encounter (Signed)
-----   Message from Marcial Pacas, MD sent at 04/06/2015  1:48 PM EDT ----- Please call pt for normal MRI brain.

## 2015-04-06 NOTE — Telephone Encounter (Signed)
Spoke Tracy [name] - she is aware of normal results.

## 2015-04-20 ENCOUNTER — Encounter: Payer: Self-pay | Admitting: "Endocrinology

## 2015-04-20 ENCOUNTER — Ambulatory Visit (INDEPENDENT_AMBULATORY_CARE_PROVIDER_SITE_OTHER): Payer: 59 | Admitting: "Endocrinology

## 2015-04-20 DIAGNOSIS — E1310 Other specified diabetes mellitus with ketoacidosis without coma: Secondary | ICD-10-CM

## 2015-04-20 DIAGNOSIS — K760 Fatty (change of) liver, not elsewhere classified: Secondary | ICD-10-CM

## 2015-04-20 DIAGNOSIS — E890 Postprocedural hypothyroidism: Secondary | ICD-10-CM

## 2015-04-20 DIAGNOSIS — E8881 Metabolic syndrome: Secondary | ICD-10-CM

## 2015-04-20 DIAGNOSIS — E1110 Type 2 diabetes mellitus with ketoacidosis without coma: Secondary | ICD-10-CM

## 2015-04-20 DIAGNOSIS — L83 Acanthosis nigricans: Secondary | ICD-10-CM

## 2015-04-20 DIAGNOSIS — K7469 Other cirrhosis of liver: Secondary | ICD-10-CM

## 2015-04-20 DIAGNOSIS — E161 Other hypoglycemia: Secondary | ICD-10-CM

## 2015-04-20 DIAGNOSIS — R1013 Epigastric pain: Secondary | ICD-10-CM

## 2015-04-20 DIAGNOSIS — E88819 Insulin resistance, unspecified: Secondary | ICD-10-CM

## 2015-04-20 DIAGNOSIS — I10 Essential (primary) hypertension: Secondary | ICD-10-CM

## 2015-04-20 DIAGNOSIS — L680 Hirsutism: Secondary | ICD-10-CM

## 2015-04-20 LAB — GLUCOSE, POCT (MANUAL RESULT ENTRY): POC GLUCOSE: 131 mg/dL — AB (ref 70–99)

## 2015-04-20 LAB — POCT GLYCOSYLATED HEMOGLOBIN (HGB A1C): HEMOGLOBIN A1C: 6.5

## 2015-04-20 NOTE — Patient Instructions (Signed)
Follow up visit in 2 months.  
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
## 2015-04-27 ENCOUNTER — Encounter: Payer: Self-pay | Admitting: Neurology

## 2015-04-27 ENCOUNTER — Ambulatory Visit (INDEPENDENT_AMBULATORY_CARE_PROVIDER_SITE_OTHER): Payer: 59 | Admitting: Neurology

## 2015-04-27 VITALS — BP 124/82 | HR 79 | Ht 71.0 in | Wt 309.0 lb

## 2015-04-27 DIAGNOSIS — M542 Cervicalgia: Secondary | ICD-10-CM

## 2015-04-27 DIAGNOSIS — G500 Trigeminal neuralgia: Secondary | ICD-10-CM | POA: Diagnosis not present

## 2015-04-27 DIAGNOSIS — R7303 Prediabetes: Secondary | ICD-10-CM

## 2015-04-27 DIAGNOSIS — R7309 Other abnormal glucose: Secondary | ICD-10-CM

## 2015-04-27 DIAGNOSIS — R202 Paresthesia of skin: Secondary | ICD-10-CM | POA: Diagnosis not present

## 2015-04-27 NOTE — Progress Notes (Signed)
PATIENT: Tracy [name] DOB: 07-13-1966  Chief Complaint  Patient presents with  . Trigeminal Neuralgia    She would like to further discuss her MRI results.  Feels gabapentin has not been helpful for her symptoms.  She feels the medication has caused excessive sleepiness and an unsteady gait.     HISTORICAL  Tracy [name] is a 49 year old right-handed female, seen in refer by her primary care physician Dr. Rachell Cipro for evaluation of right facial pain, right neck pain, radiating pain to right shoulder and arm.  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 She was under the care of Dr. Melton Alar then, she also had a frequent migraine at that time.  Since April 2016, she began to notice recurrent similar right facial pain, transient sharp radiating pain from right temporal region to her right nasal area, triggered by wind blow on her face, touching her face, present 25-50% time of the day, she has tried Ultram, Vicodin without helping, end of July to early August, she got 2 weeks course of prednisone package without helping her pain  She also complains of chronic right-sided neck pain, radiating pain to her right shoulder, right deltoid region, getting worse over the past few months, difficulty sleeping on her right side, subjective weakness of her right hand, also complains of worsening bilateral hands paresthesia, right worse than left.  She denies bilateral feet  paresthesia, no gait difficulty.  UPDATE Sep 22nd 2016: She is taking gabapentin 100mg  up to 1100mg /day, she still has significant right facial pain, radiating from right ear to her right cheek, achiness around her right eye.  If she turns her eye, she noticed sharp shooting, jolt pain to her right eye, She also complains of side effect of fatigue, clumsiness.   She also complains of worsening right hand paresthesia, getting worse while writing, radiating to right arm, right neck pain, right shoulder pain.   REVIEW OF SYSTEMS: Full 14 system review of systems performed and notable only for leg swelling, apnea, snoring, joint pain, achy muscles, neck pain, numbness, depression anxiety  ALLERGIES: Allergies  Allergen Reactions  . Sulfa Antibiotics Itching, Swelling and Rash  . Bupropion Rash  . Bupropion Hcl Itching and Rash  . Codeine Itching  . Sulfamethoxazole-Trimethoprim Rash    HOME MEDICATIONS: Current Outpatient Prescriptions  Medication Sig Dispense Refill  . ALPRAZolam (XANAX) 0.5 MG tablet Take 0.5 mg by mouth at bedtime as needed for anxiety.    . gabapentin (NEURONTIN) 100 MG capsule 3 tablets 3 times a day 270 capsule 3  . levothyroxine (SYNTHROID, LEVOTHROID) 200 MCG tablet Take 1.5 tablets (300 mcg total) by mouth daily before breakfast. 135 tablet 1  . lisinopril-hydrochlorothiazide (PRINZIDE,ZESTORETIC) 10-12.5 MG per tablet Take 1 tablet by mouth every morning.     . naproxen sodium (ANAPROX) 220 MG tablet Take 440 mg by mouth daily as needed (pain).    . ranitidine (ZANTAC) 150 MG tablet Take 150 mg by mouth as needed for heartburn.    . venlafaxine XR (EFFEXOR-XR) 150 MG 24 hr capsule Take  300 mg by mouth daily with breakfast.      No current facility-administered medications for this visit.    PAST MEDICAL HISTORY: Past Medical History  Diagnosis Date  . PCOS (polycystic ovarian syndrome)   . History of migraine headaches   . Hypothyroidism   . GERD  (gastroesophageal reflux disease)   . History of kidney stones   . Arthritis     back, knees, right elbow  . Anxiety   . High cholesterol     no current med.  . Hypertension     under control with meds., has been on med. x 4 yr.  . Depression   . Achilles tendonitis 02/2013    right  . Acquired Haglund's deformity of right heel 02/2013  . 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 Insulin resistance     does not check blood sugar at home  . History of electroconvulsive therapy   . Dental crowns present   . Diabetes mellitus without complication   . PONV (postoperative nausea and vomiting)     also hx. of emergence delirium 2007  . Cirrhosis of liver   . Trigeminal neuralgia   . Neck pain     PAST SURGICAL HISTORY: Past Surgical History  Procedure Laterality Date  . Appendectomy    . Pilonidal cyst excision  1994  . Total thyroidectomy  2003  . Ovarian cyst surgery Left 1998  . Lumbar laminectomy      X 3  . Achilles tendon surgery Left 2007  . Excision haglund's deformity with achilles tendon repair Right 02/25/2013    Procedure: RIGHT ACHILLES DEBRIDEMENT AND RECONSTRUCTION;  HAGLUND'S EXCISION; GASTROC RECESSION AND FLEXOR HALLUCIS LONGUS TRANSFER;  Surgeon: Wylene Simmer, MD;  Location: [facility];  Service: Orthopedics;  Laterality: Right;  . Esophagogastroduodenoscopy (egd) with propofol N/A 04/20/2014    Procedure: ESOPHAGOGASTRODUODENOSCOPY (EGD) WITH PROPOFOL;  Surgeon: Arta Silence, MD;  Location: [facility];  Service: Endoscopy;  Laterality: N/A;  . Colonoscopy with propofol N/A 04/20/2014    Procedure: COLONOSCOPY WITH PROPOFOL;  Surgeon: Arta Silence, MD;  Location: [facility];  Service: Endoscopy;  Laterality: N/A;    FAMILY HISTORY: Family History  Problem Relation Age of Onset  . Breast cancer Mother   . Hypertension Mother   . Heart disease Father   . Depression Father   . Cancer Brother     THROAT  . Depression Brother   . Anxiety  disorder Brother   . Depression Sister     SOCIAL HISTORY:  Social History   Social History  . Marital Status: Married    Spouse Name: N/A  . Number of Children: 0  . Years of Education: 16   Occupational History  . RN- PICU    Social History Main Topics  . Smoking status: Current Some Day Smoker -- 0.50 packs/day for 11 years    Types: Cigarettes    Last Attempt to Quit: 08/05/2013  . Smokeless tobacco: Never Used     Comment: Smokes cigarettes occasionally  . Alcohol Use: No     Comment: Has not had any alcohol since 07/2014 - before this date, she rarely drink.  . Drug Use: No  . Sexual Activity: No   Other Topics Concern  . Not on file   Social History Narrative   RN - [employer]   Regular exercise: no   Caffeine use: 2 x daily   Right-handed.   Lives alone.     PHYSICAL  EXAM   Filed Vitals:   04/27/15 1557  BP: 124/82  Pulse: 79  Height: 5\' 11"  (1.803 m)  Weight: 309 lb (140.161 kg)    Not recorded      Body mass index is 43.12 kg/(m^2).  PHYSICAL EXAMNIATION:  Gen: NAD, conversant, well nourised, obese, well groomed                     Cardiovascular: Regular rate rhythm, no peripheral edema, warm, nontender. Eyes: Conjunctivae clear without exudates or hemorrhage Neck: Supple, no carotid bruise. Pulmonary: Clear to auscultation bilaterally   NEUROLOGICAL EXAM:  MENTAL STATUS: Speech:    Speech is normal; fluent and spontaneous with normal comprehension.  Cognition:     Orientation to time, place and person     Normal recent and remote memory     Normal Attention span and concentration     Normal Language, naming, repeating,spontaneous speech     Fund of knowledge   CRANIAL NERVES: CN II: Visual fields are full to confrontation. Fundoscopic exam is normal with sharp discs and no vascular changes. Pupils are round equal and briskly reactive to light. CN III, IV, VI: extraocular movement are normal. No ptosis. CN V: Facial sensation  is intact to pinprick in all 3 divisions bilaterally. Corneal responses are intact.  CN VII: Face is symmetric with normal eye closure and smile. CN VIII: Hearing is normal to rubbing fingers CN IX, X: Palate elevates symmetrically. Phonation is normal. CN XI: Head turning and shoulder shrug are intact CN XII: Tongue is midline with normal movements and no atrophy.  MOTOR: There is no pronator drift of out-stretched arms. Muscle bulk and tone are normal. Muscle strength is normal.  REFLEXES: Reflexes are 2+ and symmetric at the biceps, triceps, knees, and ankles. Plantar responses are flexor.  SENSORY: Intact to light touch, pinprick, position sense, and vibration sense are intact in fingers and toes.  COORDINATION: Rapid alternating movements and fine finger movements are intact. There is no dysmetria on finger-to-nose and heel-knee-shin.    GAIT/STANCE: Need to push up to get up from seated position, mild unsteady, due to knee pain, antalgic gait, also limited by her big body habitus  DIAGNOSTIC DATA (LABS, IMAGING, TESTING) - I reviewed patient records, labs, notes, testing and imaging myself where available.   ASSESSMENT AND PLAN  Tracy [name] is a 49 y.o. female with past medical history of polycystic ovarian disease, obesity, depression anxiety, type 2 diabetes A1c was 6.2 to 6.6, she presented with recurrent right facial pain, involving right V2 distribution since April 2016, also complains of chronic neck pain, radiating pain to right shoulder, right arm, bilateral hands paresthesia  Right trigeminal neuralgia   MRI of the brain with and without contrast was normal  She failed to respond to Neurontin up to 1100 mg daily   Today I gave her Lyrica sample, we titrating up to 100 mg 3 times a day Right neck pain, radiating pain to right shoulder, bilateral hands paresthesia  Suggestive of right cervical radiculopathy versus bilateral carpal tunnel syndromes  EMG  nerve conduction study  Type 2 diabetes   Marcial Pacas, M.D. Ph.D.  [facility] [address] [place], [place] [address] Ph: [phone] Fax: [phone]  CC: Dr. Rachell Cipro

## 2015-05-12 ENCOUNTER — Other Ambulatory Visit: Payer: Self-pay | Admitting: Internal Medicine

## 2015-05-15 ENCOUNTER — Other Ambulatory Visit: Payer: Self-pay | Admitting: *Deleted

## 2015-05-15 DIAGNOSIS — E034 Atrophy of thyroid (acquired): Secondary | ICD-10-CM

## 2015-05-15 MED ORDER — LEVOTHYROXINE SODIUM 200 MCG PO TABS: 300.0000 ug | ORAL_TABLET | Freq: Every day | ORAL | Status: DC

## 2015-05-25 ENCOUNTER — Encounter: Payer: Self-pay | Admitting: "Endocrinology

## 2015-05-26 ENCOUNTER — Other Ambulatory Visit: Payer: Self-pay | Admitting: "Endocrinology

## 2015-05-26 ENCOUNTER — Other Ambulatory Visit: Payer: Self-pay | Admitting: *Deleted

## 2015-05-26 DIAGNOSIS — E034 Atrophy of thyroid (acquired): Secondary | ICD-10-CM

## 2015-05-26 MED ORDER — LEVOTHYROXINE SODIUM 200 MCG PO TABS: 300.0000 ug | ORAL_TABLET | Freq: Every day | ORAL | Status: DC

## 2015-05-27 LAB — T4, FREE: Free T4: 0.42 ng/dL — ABNORMAL LOW (ref 0.80–1.80)

## 2015-05-27 LAB — TSH: TSH: 26.665 u[IU]/mL — ABNORMAL HIGH (ref 0.350–4.500)

## 2015-05-27 LAB — LIPID PANEL
CHOL/HDL RATIO: 5.5 ratio — AB (ref <=5.0)
CHOLESTEROL: 192 mg/dL (ref 125–200)
HDL: 35 mg/dL — AB (ref >=46)
LDL Cholesterol: 129 mg/dL (ref <130)
TRIGLYCERIDES: 140 mg/dL (ref <150)
VLDL: 28 mg/dL (ref <30)

## 2015-05-27 LAB — MICROALBUMIN / CREATININE URINE RATIO
Creatinine, Urine: 174 mg/dL (ref 20–320)
Microalb Creat Ratio: 5 mcg/mg creat (ref <30)
Microalb, Ur: 0.8 mg/dL

## 2015-05-27 LAB — T3, FREE: T3, Free: 1.9 pg/mL — ABNORMAL LOW (ref 2.3–4.2)

## 2015-05-27 LAB — DHEA-SULFATE: DHEA-SO4: 36 ug/dL (ref 19–231)

## 2015-05-27 LAB — C-PEPTIDE: C-Peptide: 7.61 ng/mL — ABNORMAL HIGH (ref 0.80–3.90)

## 2015-05-29 LAB — TESTOSTERONE, FREE, TOTAL, SHBG
Sex Hormone Binding: 35 nmol/L (ref 17–124)
TESTOSTERONE: 35 ng/dL (ref 10–70)
Testosterone, Free: 6.1 pg/mL (ref 0.6–6.8)
Testosterone-% Free: 1.7 % (ref 0.4–2.4)

## 2015-06-01 ENCOUNTER — Encounter: Payer: 59 | Admitting: Neurology

## 2015-06-01 LAB — ANDROSTENEDIONE: ANDROSTENEDIONE: 77 ng/dL

## 2015-06-02 ENCOUNTER — Telehealth: Payer: Self-pay | Admitting: "Endocrinology

## 2015-06-02 DIAGNOSIS — E890 Postprocedural hypothyroidism: Secondary | ICD-10-CM

## 2015-06-02 MED ORDER — SYNTHROID 200 MCG PO TABS: ORAL_TABLET | ORAL | Status: DC

## 2015-06-02 NOTE — Telephone Encounter (Signed)
1. I called the patient to discuss her recent lab results. 2. Subjective: She has been taking generic levothyroxine, 1.5 of the 200 mg tablets every day, but had run out a week before her recent blood tests. 3. Objective: Her testosterone, DHEAS, and androstenedione were quite normal, surprisingly normal for her level of obesity. Her C-peptide is elevated, c/w the degree of insulin resistance associated with her obesity. Her urine microalbumin/creatinine ratio was normal. Her cholesterol and LDL were mildly elevated. Her TSH of 26.665 was quite high and both her free T4 and free T3 were low.  4. Assessment: Her hypothyroidism is worse, in part due to missing thyroid hormone for a week, but also in part due to taking generic levothyroxine. 5. Plan: I sent in an e-scrip for brand Synthroid, 1.5 of the 200 mcg tablets daily, #135, with 6 refills. Until she can pick up the Synthroid next Tuesday, I asked her to increase the generic LT4 to 400 mg/day for the next 4 days.  I also sent in an order to repeat her TFTs in 6-8 weeks.  [name]

## 2015-06-05 ENCOUNTER — Observation Stay (HOSPITAL_COMMUNITY)
Admission: AD | Admit: 2015-06-05 | Discharge: 2015-06-06 | Disposition: A | Discharge status: Home / Self Care | Payer: 59 | Source: Intra-hospital | Attending: Psychiatry | Admitting: Psychiatry

## 2015-06-05 ENCOUNTER — Emergency Department (HOSPITAL_COMMUNITY)
Admission: EM | Admit: 2015-06-05 | Discharge: 2015-06-05 | Disposition: A | Discharge status: Home / Self Care | Payer: 59 | Attending: Emergency Medicine | Admitting: Emergency Medicine

## 2015-06-05 ENCOUNTER — Encounter (HOSPITAL_COMMUNITY): Payer: Self-pay | Admitting: *Deleted

## 2015-06-05 ENCOUNTER — Encounter (HOSPITAL_COMMUNITY): Payer: Self-pay | Admitting: Emergency Medicine

## 2015-06-05 DIAGNOSIS — F329 Major depressive disorder, single episode, unspecified: Secondary | ICD-10-CM | POA: Insufficient documentation

## 2015-06-05 DIAGNOSIS — E782 Mixed hyperlipidemia: Secondary | ICD-10-CM | POA: Insufficient documentation

## 2015-06-05 DIAGNOSIS — F411 Generalized anxiety disorder: Secondary | ICD-10-CM | POA: Insufficient documentation

## 2015-06-05 DIAGNOSIS — Z79899 Other long term (current) drug therapy: Secondary | ICD-10-CM | POA: Insufficient documentation

## 2015-06-05 DIAGNOSIS — G43909 Migraine, unspecified, not intractable, without status migrainosus: Secondary | ICD-10-CM | POA: Insufficient documentation

## 2015-06-05 DIAGNOSIS — G4733 Obstructive sleep apnea (adult) (pediatric): Secondary | ICD-10-CM | POA: Insufficient documentation

## 2015-06-05 DIAGNOSIS — K760 Fatty (change of) liver, not elsewhere classified: Secondary | ICD-10-CM | POA: Insufficient documentation

## 2015-06-05 DIAGNOSIS — F4310 Post-traumatic stress disorder, unspecified: Secondary | ICD-10-CM | POA: Insufficient documentation

## 2015-06-05 DIAGNOSIS — E119 Type 2 diabetes mellitus without complications: Secondary | ICD-10-CM | POA: Insufficient documentation

## 2015-06-05 DIAGNOSIS — F4010 Social phobia, unspecified: Secondary | ICD-10-CM | POA: Diagnosis not present

## 2015-06-05 DIAGNOSIS — E039 Hypothyroidism, unspecified: Secondary | ICD-10-CM | POA: Diagnosis not present

## 2015-06-05 DIAGNOSIS — R45851 Suicidal ideations: Secondary | ICD-10-CM

## 2015-06-05 DIAGNOSIS — F17210 Nicotine dependence, cigarettes, uncomplicated: Secondary | ICD-10-CM | POA: Insufficient documentation

## 2015-06-05 DIAGNOSIS — I10 Essential (primary) hypertension: Secondary | ICD-10-CM | POA: Diagnosis not present

## 2015-06-05 DIAGNOSIS — Z87442 Personal history of urinary calculi: Secondary | ICD-10-CM | POA: Diagnosis not present

## 2015-06-05 DIAGNOSIS — F332 Major depressive disorder, recurrent severe without psychotic features: Principal | ICD-10-CM | POA: Insufficient documentation

## 2015-06-05 DIAGNOSIS — Z6841 Body Mass Index (BMI) 40.0 and over, adult: Secondary | ICD-10-CM | POA: Insufficient documentation

## 2015-06-05 DIAGNOSIS — E7800 Pure hypercholesterolemia, unspecified: Secondary | ICD-10-CM | POA: Diagnosis not present

## 2015-06-05 DIAGNOSIS — Z8659 Personal history of other mental and behavioral disorders: Secondary | ICD-10-CM | POA: Insufficient documentation

## 2015-06-05 DIAGNOSIS — K219 Gastro-esophageal reflux disease without esophagitis: Secondary | ICD-10-CM | POA: Diagnosis not present

## 2015-06-05 DIAGNOSIS — F32A Depression, unspecified: Secondary | ICD-10-CM

## 2015-06-05 DIAGNOSIS — K7460 Unspecified cirrhosis of liver: Secondary | ICD-10-CM | POA: Diagnosis not present

## 2015-06-05 DIAGNOSIS — M1990 Unspecified osteoarthritis, unspecified site: Secondary | ICD-10-CM | POA: Diagnosis not present

## 2015-06-05 DIAGNOSIS — L83 Acanthosis nigricans: Secondary | ICD-10-CM | POA: Diagnosis not present

## 2015-06-05 DIAGNOSIS — E890 Postprocedural hypothyroidism: Secondary | ICD-10-CM | POA: Insufficient documentation

## 2015-06-05 DIAGNOSIS — N970 Female infertility associated with anovulation: Secondary | ICD-10-CM | POA: Diagnosis not present

## 2015-06-05 DIAGNOSIS — E1165 Type 2 diabetes mellitus with hyperglycemia: Secondary | ICD-10-CM | POA: Diagnosis not present

## 2015-06-05 DIAGNOSIS — Z8619 Personal history of other infectious and parasitic diseases: Secondary | ICD-10-CM | POA: Diagnosis not present

## 2015-06-05 LAB — COMPREHENSIVE METABOLIC PANEL
ALT: 23 U/L (ref 14–54)
AST: 26 U/L (ref 15–41)
Albumin: 3.4 g/dL — ABNORMAL LOW (ref 3.5–5.0)
Alkaline Phosphatase: 86 U/L (ref 38–126)
Anion gap: 13 (ref 5–15)
BUN: 13 mg/dL (ref 6–20)
CHLORIDE: 103 mmol/L (ref 101–111)
CO2: 25 mmol/L (ref 22–32)
CREATININE: 0.8 mg/dL (ref 0.44–1.00)
Calcium: 9.5 mg/dL (ref 8.9–10.3)
GFR calc Af Amer: >60 mL/min (ref >60)
GFR calc non Af Amer: >60 mL/min (ref >60)
Glucose, Bld: 105 mg/dL — ABNORMAL HIGH (ref 65–99)
Potassium: 3.9 mmol/L (ref 3.5–5.1)
SODIUM: 141 mmol/L (ref 135–145)
Total Bilirubin: 0.3 mg/dL (ref 0.3–1.2)
Total Protein: 6.7 g/dL (ref 6.5–8.1)

## 2015-06-05 LAB — CBC WITH DIFFERENTIAL/PLATELET
Basophils Absolute: 0 10*3/uL (ref 0.0–0.1)
Basophils Relative: 0 %
EOS PCT: 3 %
Eosinophils Absolute: 0.2 10*3/uL (ref 0.0–0.7)
HEMATOCRIT: 38.5 % (ref 36.0–46.0)
Hemoglobin: 12.6 g/dL (ref 12.0–15.0)
LYMPHS ABS: 2.1 10*3/uL (ref 0.7–4.0)
LYMPHS PCT: 30 %
MCH: 27.2 pg (ref 26.0–34.0)
MCHC: 32.7 g/dL (ref 30.0–36.0)
MCV: 83.2 fL (ref 78.0–100.0)
Monocytes Absolute: 0.5 10*3/uL (ref 0.1–1.0)
Monocytes Relative: 6 %
Neutro Abs: 4.3 10*3/uL (ref 1.7–7.7)
Neutrophils Relative %: 61 %
Platelets: 241 10*3/uL (ref 150–400)
RBC: 4.63 MIL/uL (ref 3.87–5.11)
RDW: 16 % — AB (ref 11.5–15.5)
WBC: 7.1 10*3/uL (ref 4.0–10.5)

## 2015-06-05 LAB — ETHANOL: Alcohol, Ethyl (B): <5 mg/dL (ref <5)

## 2015-06-05 MED ORDER — HYDROXYZINE HCL 25 MG PO TABS
25.0000 mg | ORAL_TABLET | Freq: Four times a day (QID) | ORAL | Status: DC | PRN
  Administered 2015-06-05: 25 mg via ORAL
  Filled 2015-06-05: qty 1

## 2015-06-05 MED ORDER — ALUM & MAG HYDROXIDE-SIMETH 200-200-20 MG/5ML PO SUSP: 30.0000 mL | ORAL | Status: DC | PRN

## 2015-06-05 MED ORDER — VENLAFAXINE HCL ER 150 MG PO CP24: 300.0000 mg | ORAL_CAPSULE | Freq: Every day | ORAL | Status: DC

## 2015-06-05 MED ORDER — ALPRAZOLAM 0.25 MG PO TABS: 0.2500 mg | ORAL_TABLET | Freq: Three times a day (TID) | ORAL | Status: DC | PRN

## 2015-06-05 MED ORDER — NAPROXEN 500 MG PO TABS
500.0000 mg | ORAL_TABLET | Freq: Every day | ORAL | Status: DC | PRN
  Administered 2015-06-06: 500 mg via ORAL
  Filled 2015-06-05: qty 1

## 2015-06-05 MED ORDER — IBUPROFEN 200 MG PO TABS
600.0000 mg | ORAL_TABLET | Freq: Once | ORAL | Status: AC
  Administered 2015-06-05: 600 mg via ORAL

## 2015-06-05 MED ORDER — IBUPROFEN 400 MG PO TABS
600.0000 mg | ORAL_TABLET | Freq: Once | ORAL | Status: AC
  Filled 2015-06-05 (×2): qty 1

## 2015-06-05 MED ORDER — TRAZODONE HCL 50 MG PO TABS: 50.0000 mg | ORAL_TABLET | Freq: Every evening | ORAL | Status: DC | PRN

## 2015-06-05 MED ORDER — MAGNESIUM HYDROXIDE 400 MG/5ML PO SUSP: 30.0000 mL | Freq: Every day | ORAL | Status: DC | PRN

## 2015-06-05 MED ORDER — LISINOPRIL 10 MG PO TABS
10.0000 mg | ORAL_TABLET | Freq: Every day | ORAL | Status: DC
  Administered 2015-06-05: 10 mg via ORAL
  Filled 2015-06-05: qty 1

## 2015-06-05 MED ORDER — PREGABALIN 50 MG PO CAPS
100.0000 mg | ORAL_CAPSULE | Freq: Three times a day (TID) | ORAL | Status: DC
  Administered 2015-06-05 – 2015-06-06 (×3): 100 mg via ORAL
  Filled 2015-06-05 (×3): qty 2

## 2015-06-05 MED ORDER — TRAZODONE HCL 50 MG PO TABS
50.0000 mg | ORAL_TABLET | Freq: Every evening | ORAL | Status: DC | PRN
  Administered 2015-06-05: 50 mg via ORAL
  Filled 2015-06-05: qty 1

## 2015-06-05 MED ORDER — LEVOTHYROXINE SODIUM 150 MCG PO TABS: 300.0000 ug | ORAL_TABLET | Freq: Every day | ORAL | Status: DC

## 2015-06-05 MED ORDER — PREGABALIN 50 MG PO CAPS
100.0000 mg | ORAL_CAPSULE | Freq: Three times a day (TID) | ORAL | Status: DC
  Administered 2015-06-05: 100 mg via ORAL
  Filled 2015-06-05: qty 2

## 2015-06-05 MED ORDER — VENLAFAXINE HCL ER 150 MG PO CP24
300.0000 mg | ORAL_CAPSULE | Freq: Every day | ORAL | Status: DC
  Administered 2015-06-06: 300 mg via ORAL
  Filled 2015-06-05: qty 2

## 2015-06-05 MED ORDER — LEVOTHYROXINE SODIUM 100 MCG PO TABS
200.0000 ug | ORAL_TABLET | Freq: Every day | ORAL | Status: DC
  Administered 2015-06-05: 200 ug via ORAL
  Filled 2015-06-05: qty 2

## 2015-06-05 MED ORDER — NAPROXEN SODIUM 275 MG PO TABS: 440.0000 mg | ORAL_TABLET | Freq: Every day | ORAL | Status: DC | PRN

## 2015-06-05 MED ORDER — ACETAMINOPHEN 325 MG PO TABS
650.0000 mg | ORAL_TABLET | Freq: Four times a day (QID) | ORAL | Status: DC | PRN
  Administered 2015-06-05: 650 mg via ORAL
  Filled 2015-06-05: qty 2

## 2015-06-05 MED ORDER — LEVOTHYROXINE SODIUM 150 MCG PO TABS
300.0000 ug | ORAL_TABLET | Freq: Every day | ORAL | Status: DC
  Administered 2015-06-06: 300 ug via ORAL
  Filled 2015-06-05: qty 2

## 2015-06-05 MED ORDER — HYDROCHLOROTHIAZIDE 12.5 MG PO CAPS
12.5000 mg | ORAL_CAPSULE | Freq: Every day | ORAL | Status: DC
  Administered 2015-06-05: 12.5 mg via ORAL
  Filled 2015-06-05: qty 1

## 2015-06-05 MED ORDER — LISINOPRIL-HYDROCHLOROTHIAZIDE 10-12.5 MG PO TABS: 1.0000 | ORAL_TABLET | Freq: Every morning | ORAL | Status: DC

## 2015-06-05 NOTE — ED Notes (Signed)
Pt belongings removed from pt and placed in labeled patient belonging bag behind POD E nurses station.

## 2015-06-05 NOTE — BH Assessment (Signed)
[facility] Assessment Progress Note  Patient was admitted to Observation Unit and will be re-evaluated in the a.m. Patient will follow up with her out patient provider upon discharge.

## 2015-06-05 NOTE — ED Notes (Signed)
Pt to ED with complaint of depression with suicidal ideations. Pt has hx of hypothyroidism and reports a phone call from PCP yesterday stating TSH level 26, pt reports feeling very depressed and wanting to "just end it all." pt reports plan to shoot self in the head with gun that she has at home. Pt has extensive hx of depression. Hx of suicide attempt with excedrin overdose. Denies alcohol or drug use at this time. A/o x4. VSS

## 2015-06-05 NOTE — BH Assessment (Addendum)
Tele Assessment Note   Tracy [name] is an 49 y.o. female. 49 year old female presenting this date with reported increased depression currently rating her depression at a 8 with thoughts of harming herself. Patient stated when they arrived to work this date  that they were speaking to their supervisor and got upset stating "she wished she never came to work." Patient stated that she had made a statement earlier this date that she was going home and "shoot herself " patient stated they had access to firearms. Although at the time of this assessment patient stated that after thinking about what she said that she would not harm herself because of her "puppies." Patient has contacted her sister-in-law to go to the patient's residence and remove all the firearms. Patient states she has been seeing a psychiatrist [facility], who has been assisting with medication management but patient stated she felt she may need some medication changes and some individual therapy. Patient states that her husband stays gone for extended periods of time due to his employment as a Administrator. Patient sated that she was seen in 2015 by [facility] for similar issues associated with depression and received outpatient treatment at that time. Patient denies any history of SA use but does have multiple health issues that patient is currently addressing. Patient reports decreased sleep patterns stating she only sleeps approximately 3 hours a night. Patient also reports increased anxiety due to her health issues rating her anxiety at a 8 at the time of this assessment. Patient stated they felt they did not meet the criteria for inpatient treatment and "just needed someone to talk to." Patient did state they continued to have thoughts of self harm but did not intend to act on them. Case was staffed with Dianna Rossetti NP and patient will be admitted to the observational unit for monitoring of behaviors. Patient was contacted by this Probation officer and agreed  to outpatient admission.         Diagnosis: Axis I: 296.23 Depression, 300.02 GAD                   Axis II: Deferred                   Axis III: Hypertension, hypothyroid, PCOS                     Axis IV: Financial issues                   Axis V: 52                         Past Medical History:  Past Medical History  Diagnosis Date  . PCOS (polycystic ovarian syndrome)   . History of migraine headaches   . Hypothyroidism   . GERD (gastroesophageal reflux disease)   . History of kidney stones   . Arthritis     back, knees, right elbow  . Anxiety   . High cholesterol     no current med.  . Hypertension     under control with meds., has been on med. x 4 yr.  . Depression   . Achilles tendonitis 02/2013    right  . Acquired Haglund's deformity of right heel 02/2013  . 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 Insulin resistance     does not check blood sugar at home  . History of electroconvulsive therapy   .  Dental crowns present   . Diabetes mellitus without complication ([place])   . PONV (postoperative nausea and vomiting)     also hx. of emergence delirium 2007  . Cirrhosis of liver ([place])   . Hepatitis A   . Trigeminal neuralgia   . Neck pain     Past Surgical History  Procedure Laterality Date  . Appendectomy    . Pilonidal cyst excision  1994  . Total thyroidectomy  2003  . Ovarian cyst surgery Left 1998  . Lumbar laminectomy      X 3  . Achilles tendon surgery Left 2007  . Excision haglund's deformity with achilles tendon repair Right 02/25/2013    Procedure: RIGHT ACHILLES DEBRIDEMENT AND RECONSTRUCTION;  HAGLUND'S EXCISION; GASTROC RECESSION AND FLEXOR HALLUCIS LONGUS TRANSFER;  Surgeon: Wylene Simmer, MD;  Location: [facility];  Service: Orthopedics;  Laterality: Right;  . Tubal ligation Right 1984  . Esophagogastroduodenoscopy (egd) with propofol N/A 04/20/2014    Procedure: ESOPHAGOGASTRODUODENOSCOPY (EGD) WITH PROPOFOL;  Surgeon: Arta Silence,  MD;  [facility];  Service: Endoscopy;  Laterality: N/A;  . Colonoscopy with propofol N/A 04/20/2014    Procedure: COLONOSCOPY WITH PROPOFOL;  Surgeon: Arta Silence, MD;  [facility];  Service: Endoscopy;  Laterality: N/A;    Family History:  Family History  Problem Relation Age of Onset  . Breast cancer Mother   . Hypertension Mother   . Heart disease Father   . Depression Father   . Cancer Brother     THROAT  . Depression Brother   . Anxiety disorder Brother   . Depression Sister     Social History:  reports that she has been smoking Cigarettes.  She has a 5.5 pack-year smoking history. She has never used smokeless tobacco. She reports that she does not drink alcohol or use illicit drugs.  Additional Social History:  Alcohol / Drug Use Pain Medications: See MAR Prescriptions: See MAR Over the Counter: See MAR History of alcohol / drug use?: No history of alcohol / drug abuse Longest period of sobriety (when/how long): NA  CIWA: CIWA-Ar BP: 132/81 mmHg Pulse Rate: 88 COWS:    PATIENT STRENGTHS: (choose at least two) Ability for insight Average or above average intelligence General fund of knowledge  Allergies:  Allergies  Allergen Reactions  . Sulfa Antibiotics Itching, Swelling and Rash  . Bupropion Rash  . Bupropion Hcl Itching and Rash  . Codeine Itching  . Sulfamethoxazole-Trimethoprim Rash    Home Medications:  (Not in a hospital admission)  OB/GYN Status:  Patient's last menstrual period was 05/29/2015.  General Assessment Data Location of Assessment: [facility] ED TTS Assessment: In system Is this a Tele or Face-to-Face Assessment?: Tele Assessment Is this an Initial Assessment or a Re-assessment for this encounter?: Initial Assessment Marital status: Married [place] name: NA Is patient pregnant?: No Pregnancy Status: No Living Arrangements: Spouse/significant other Can pt return to current living arrangement?: Yes Admission  Status: Voluntary Is patient capable of signing voluntary admission?: Yes Referral Source: Other Insurance type: [employer]  Medical Screening Exam ([place]) Medical Exam completed: Yes  Crisis Care Plan Living Arrangements: Spouse/significant other Name of Psychiatrist: [facility] Name of Therapist: [facility]  Education Status Is patient currently in school?: No Current Grade: NA Highest grade of school patient has completed: BSN Name of school: Facilities manager person: NA  Risk to self with the past 6 months Suicidal Ideation: Yes-Currently Present Has patient been a risk to self within the  past 6 months prior to admission? : Yes Suicidal Intent: Yes-Currently Present Has patient had any suicidal intent within the past 6 months prior to admission? : No Is patient at risk for suicide?: Yes Suicidal Plan?: Yes-Currently Present Has patient had any suicidal plan within the past 6 months prior to admission? : Yes Specify Current Suicidal Plan: Shoot herself Access to Means: Yes Specify Access to Suicidal Means: Pt. has assess to wwapons although sister is removing them.  What has been your use of drugs/alcohol within the last 12 months?: None Previous Attempts/Gestures: No How many times?: 0 Other Self Harm Risks: None Triggers for Past Attempts: None known Intentional Self Injurious Behavior: None Family Suicide History: No Recent stressful life event(s): Recent negative physical changes Persecutory voices/beliefs?: No Depression: Yes Depression Symptoms: Despondent, Insomnia Substance abuse history and/or treatment for substance abuse?: No Suicide prevention information given to non-admitted patients: Not applicable  Risk to Others within the past 6 months Homicidal Ideation: No Does patient have any lifetime risk of violence toward others beyond the six months prior to admission? : No Thoughts of Harm to Others: No Current Homicidal Intent: No Current Homicidal Plan:  No Access to Homicidal Means: No Identified Victim: NA History of harm to others?: No Assessment of Violence: None Noted Violent Behavior Description: NA Does patient have access to weapons?: Yes (Comment) Criminal Charges Pending?: No Does patient have a court date: No Is patient on probation?: No  Psychosis Hallucinations: None noted Delusions: None noted  Mental Status Report Appearance/Hygiene: In scrubs Eye Contact: Good Motor Activity: Unremarkable Speech: Unremarkable Level of Consciousness: Alert Mood: Depressed Affect: Depressed Anxiety Level: Minimal Thought Processes: Relevant Judgement: Unimpaired Orientation: Person, Situation, Time, Place Obsessive Compulsive Thoughts/Behaviors: None  Cognitive Functioning Concentration: Normal Memory: Recent Intact, Remote Intact IQ: Average Insight: Good Impulse Control: Fair Appetite: Fair Weight Loss: 0 Weight Gain: 0 Sleep: Decreased Total Hours of Sleep: 5 Vegetative Symptoms: None  ADLScreening [facility] Assessment Services) Patient's cognitive ability adequate to safely complete daily activities?: Yes Patient able to express need for assistance with ADLs?: Yes Independently performs ADLs?: Yes (appropriate for developmental age)  Prior Inpatient Therapy Prior Inpatient Therapy: Yes Prior Therapy Dates: 2015 Prior Therapy Facilty/Provider(s): [facility] Reason for Treatment: Depression  Prior Outpatient Therapy Prior Outpatient Therapy: Yes Prior Therapy Dates: 2015 Prior Therapy Facilty/Provider(s): [facility] Reason for Treatment: Depression Does patient have an ACCT team?: No Does patient have Intensive In-House Services?  : No Does patient have [facility] services? : No Does patient have P4CC services?: No  ADL Screening (condition at time of admission) Patient's cognitive ability adequate to safely complete daily activities?: Yes Is the patient deaf or have difficulty hearing?: No Does the patient have difficulty  seeing, even when wearing glasses/contacts?: No Does the patient have difficulty concentrating, remembering, or making decisions?: No Patient able to express need for assistance with ADLs?: Yes Does the patient have difficulty dressing or bathing?: No Independently performs ADLs?: Yes (appropriate for developmental age) Does the patient have difficulty walking or climbing stairs?: No Weakness of Legs: None Weakness of Arms/Hands: None     Therapy Consults (therapy consults require a physician order) PT Evaluation Needed: No OT Evalulation Needed: No SLP Evaluation Needed: No Abuse/Neglect Assessment (Assessment to be complete while patient is alone) Physical Abuse: Denies Verbal Abuse: Denies Sexual Abuse: Denies Exploitation of patient/patient's resources: Denies Self-Neglect: Denies Values / Beliefs Cultural Requests During Hospitalization: None Spiritual Requests During Hospitalization: None Consults Spiritual Care Consult Needed: No Social Work  Consult Needed: No Advance Directives (For Healthcare) Does patient have an advance directive?: No Would patient like information on creating an advanced directive?: No - patient declined information    Additional Information 1:1 In Past 12 Months?: No CIRT Risk: No Elopement Risk: No Does patient have medical clearance?: Yes     Disposition: Case was staffed with Dianna Rossetti NP and patient will be admitted to the observational unit for monitoring of behaviors. Patient was contacted by this Probation officer and agreed to outpatient admission.          Disposition Initial Assessment Completed for this Encounter: Yes Disposition of Patient: Outpatient treatment Type of outpatient treatment: Adult  Mamie Nick 06/05/2015 11:54 AM

## 2015-06-05 NOTE — ED Notes (Signed)
PT did not want any medication for pain at this time.

## 2015-06-05 NOTE — ED Provider Notes (Signed)
The patient accepted to [facility] for transfer by Dr. Dwyane Dee. Vital signs and blood work has been reviewed, and otherwise unremarkable. She is well-appearing and has no complaints at this time. Felt medically cleared and appropriate for transfer.  Forde Dandy, MD 06/05/15 [phone]

## 2015-06-05 NOTE — Progress Notes (Signed)
Pt is a 49 y/o female admitted to [facility] (Observation Unit) for stabilization of symptoms. Per report and chart pt has a h/o depression and anxiety d/o. Pt reported increased depression over the the last week related to elevated TSH level 26 and was having SI (I just want to end it all) with plan to shoot herself. Per report pt also attempted suicide in the past by taking lots of Excedrin pills then spat it out. On initial contact pt presented with depressed affect and mood with intermittent crying spells. Pt denied SI, HI and AVH, reported headache 5/10 (h/o Migraine). Pt verbally contracts for safety. Pt reported poor sleep and decreased appetite over the last week at time of assessment. Skin search done, skin is intact, old scar noted on sacral area. Pt had no belongings at time of admission. Scheduled medication administered as ordered including PRN Vistaril 25 mg PO for anxiety and PRN Tylenol 650 mg PO for c/o headache and was tolerated well. Supper and fluids offered. Continued support, availability and encouragement offered to pt. Safety maintained on Q 15 minutes checks as ordered without self injurious behavior to note at this time.

## 2015-06-05 NOTE — ED Notes (Addendum)
PT is very concerned about going to [facility].  She is not worried about staff, but she is worried about other patients acting out.  She states she has had bad experiences in in patient facilities in the past.  Pt feels she was pressured by the TTS interviewer to be voluntarily committed.  I told her that we would not make her go anywhere against her will, but she stated she needed to comply to the "system".

## 2015-06-05 NOTE — ED Provider Notes (Signed)
CSN: [id number]     Arrival date & time 06/05/15  6789 History   First MD Initiated Contact with Patient 06/05/15 1016     Chief Complaint  Patient presents with  . Depression  . Suicidal     (Consider location/radiation/quality/duration/timing/severity/associated sxs/prior Treatment) Patient is a 49 y.o. female presenting with depression. The history is provided by the patient. No language interpreter was used.  Depression This is a new problem. The current episode started today. The problem occurs constantly. The problem has been gradually worsening. Pertinent negatives include no abdominal pain or coughing. Nothing aggravates the symptoms. She has tried nothing for the symptoms. The treatment provided no relief.    Past Medical History  Diagnosis Date  . PCOS (polycystic ovarian syndrome)   . History of migraine headaches   . Hypothyroidism   . GERD (gastroesophageal reflux disease)   . History of kidney stones   . Arthritis     back, knees, right elbow  . Anxiety   . High cholesterol     no current med.  . Hypertension     under control with meds., has been on med. x 4 yr.  . Depression   . Achilles tendonitis 02/2013    right  . Acquired Haglund's deformity of right heel 02/2013  . 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 Insulin resistance     does not check blood sugar at home  . History of electroconvulsive therapy   . Dental crowns present   . Diabetes mellitus without complication ([place])   . PONV (postoperative nausea and vomiting)     also hx. of emergence delirium 2007  . Cirrhosis of liver ([place])   . Hepatitis A   . Trigeminal neuralgia   . Neck pain    Past Surgical History  Procedure Laterality Date  . Appendectomy    . Pilonidal cyst excision  1994  . Total thyroidectomy  2003  . Ovarian cyst surgery Left 1998  . Lumbar laminectomy      X 3  . Achilles tendon surgery Left 2007  . Excision haglund's deformity with achilles tendon repair Right  02/25/2013    Procedure: RIGHT ACHILLES DEBRIDEMENT AND RECONSTRUCTION;  HAGLUND'S EXCISION; GASTROC RECESSION AND FLEXOR HALLUCIS LONGUS TRANSFER;  Surgeon: Wylene Simmer, MD;  Location: [place];  Service: Orthopedics;  Laterality: Right;  . Tubal ligation Right 1984  . Esophagogastroduodenoscopy (egd) with propofol N/A 04/20/2014    Procedure: ESOPHAGOGASTRODUODENOSCOPY (EGD) WITH PROPOFOL;  Surgeon: Arta Silence, MD;  Location: [facility];  Service: Endoscopy;  Laterality: N/A;  . Colonoscopy with propofol N/A 04/20/2014    Procedure: COLONOSCOPY WITH PROPOFOL;  Surgeon: Arta Silence, MD;  Location: [facility];  Service: Endoscopy;  Laterality: N/A;   Family History  Problem Relation Age of Onset  . Breast cancer Mother   . Hypertension Mother   . Heart disease Father   . Depression Father   . Cancer Brother     THROAT  . Depression Brother   . Anxiety disorder Brother   . Depression Sister    Social History  Substance Use Topics  . Smoking status: Current Some Day Smoker -- 0.50 packs/day for 11 years    Types: Cigarettes    Last Attempt to Quit: 08/05/2013  . Smokeless tobacco: Never Used     Comment: Smokes cigarettes occasionally  . Alcohol Use: No     Comment: Has not had any alcohol since 07/2014 - before this date, she rarely  drink.   OB History    Gravida Para Term Preterm AB TAB SAB Ectopic Multiple Living   0              Review of Systems  Respiratory: Negative for cough.   Gastrointestinal: Negative for abdominal pain.  Psychiatric/Behavioral: Positive for depression.  All other systems reviewed and are negative.     Allergies  Sulfa antibiotics; Bupropion; Bupropion hcl; Codeine; and Sulfamethoxazole-trimethoprim  Home Medications   Prior to Admission medications   Medication Sig Start Date End Date Taking? Authorizing Provider  ALPRAZolam Duanne Moron) 0.5 MG tablet Take 0.25 mg by mouth 3 (three) times daily as needed for anxiety.     Yes Historical Provider, MD  levothyroxine (SYNTHROID, LEVOTHROID) 200 MCG tablet Take 400 mcg by mouth daily before breakfast.   Yes Historical Provider, MD  lisinopril-hydrochlorothiazide (PRINZIDE,ZESTORETIC) 10-12.5 MG per tablet Take 1 tablet by mouth every morning.    Yes Historical Provider, MD  naproxen sodium (ANAPROX) 220 MG tablet Take 440 mg by mouth daily as needed (pain).   Yes Historical Provider, MD  pregabalin (LYRICA) 100 MG capsule Take 100 mg by mouth 3 (three) times daily.   Yes Historical Provider, MD  ranitidine (ZANTAC) 150 MG tablet Take 150 mg by mouth as needed for heartburn.   Yes Historical Provider, MD  venlafaxine XR (EFFEXOR-XR) 150 MG 24 hr capsule Take 300 mg by mouth daily with breakfast.    Yes Historical Provider, MD  gabapentin (NEURONTIN) 100 MG capsule 3 tablets 3 times a day Patient not taking: Reported on 06/05/2015 03/30/15   Marcial Pacas, MD  SYNTHROID 200 MCG tablet Take 1.5 pills each morning. Patient taking differently: Take 300 mcg by mouth daily before breakfast. Take 1.5 pills each morning. 06/02/15 06/01/16  [name], MD   BP 132/81 mmHg  Pulse 88  Temp(Src) 97.9 F (36.6 C) (Oral)  Resp 18  SpO2 98%  LMP 05/29/2015 Physical Exam  Constitutional: She is oriented to person, place, and time. She appears well-developed and well-nourished.  HENT:  Head: Normocephalic.  Eyes: EOM are normal.  Neck: Normal range of motion.  Cardiovascular: Normal rate and normal heart sounds.   Pulmonary/Chest: Effort normal.  Abdominal: She exhibits no distension.  Musculoskeletal: Normal range of motion.  Neurological: She is alert and oriented to person, place, and time.  Psychiatric: She has a normal mood and affect.  Nursing note and vitals reviewed. Pt reports she considered shooting herself today.   Pt has a gun.   Pt changed her mind because of her dogs.  Pt reports increasing depression for the past year.   Pt also reports thyroid is  Messed  up ED Course  Procedures (including critical care time) Labs Review Labs Reviewed  CBC WITH DIFFERENTIAL/PLATELET - Abnormal; Notable for the following:    RDW 16.0 (*)    All other components within normal limits  COMPREHENSIVE METABOLIC PANEL  ETHANOL  URINE RAPID DRUG SCREEN, HOSP PERFORMED    Imaging Review No results found. I have personally reviewed and evaluated these images and lab results as part of my medical decision-making.   EKG Interpretation None      MDM  TTS consult.    Final diagnoses:  Suicidal thoughts  Depression    Pt will be observed by Psch.    See [facility] notes   Fransico Meadow, PA-C 06/05/15 1252  Gareth Morgan, MD 06/05/15 [phone]

## 2015-06-05 NOTE — ED Notes (Signed)
Security at the bedside wanding patient.

## 2015-06-05 NOTE — ED Notes (Addendum)
Security has been paged to wand pt. Staffing has been notified for need of sitter. Pt moved to room directly in front of nurses station with door open.

## 2015-06-05 NOTE — ED Notes (Signed)
2nd ED transfer consent written in incorrect chart.

## 2015-06-05 NOTE — BH Assessment (Signed)
[facility] Assessment Progress Note This Probation officer contacted Schoosman M.D. at [facility] to inform of patient's disposition. Patient will be admitted to Observational Unit with behaviors being monitored.Staff was informed that [facility] will contact them to inform them of time that patient can be admitted.

## 2015-06-05 NOTE — ED Notes (Signed)
Pt requested sister in law to take all valuables home including clothing.

## 2015-06-06 DIAGNOSIS — F329 Major depressive disorder, single episode, unspecified: Secondary | ICD-10-CM | POA: Diagnosis not present

## 2015-06-06 DIAGNOSIS — F4310 Post-traumatic stress disorder, unspecified: Secondary | ICD-10-CM | POA: Diagnosis not present

## 2015-06-06 DIAGNOSIS — R45851 Suicidal ideations: Secondary | ICD-10-CM

## 2015-06-06 DIAGNOSIS — Z8659 Personal history of other mental and behavioral disorders: Secondary | ICD-10-CM | POA: Insufficient documentation

## 2015-06-06 DIAGNOSIS — F4010 Social phobia, unspecified: Secondary | ICD-10-CM | POA: Diagnosis not present

## 2015-06-06 DIAGNOSIS — F332 Major depressive disorder, recurrent severe without psychotic features: Secondary | ICD-10-CM | POA: Insufficient documentation

## 2015-06-06 DIAGNOSIS — F411 Generalized anxiety disorder: Secondary | ICD-10-CM | POA: Insufficient documentation

## 2015-06-06 HISTORY — DX: Major depressive disorder, single episode, unspecified: F32.9

## 2015-06-06 MED ORDER — SYNTHROID 200 MCG PO TABS: 300.0000 ug | ORAL_TABLET | Freq: Every day | ORAL | Status: DC

## 2015-06-06 MED ORDER — TRAZODONE HCL 50 MG PO TABS: 50.0000 mg | ORAL_TABLET | Freq: Every evening | ORAL | Status: DC | PRN

## 2015-06-06 MED ORDER — VENLAFAXINE HCL ER 150 MG PO CP24: 300.0000 mg | ORAL_CAPSULE | Freq: Every day | ORAL | Status: DC

## 2015-06-06 MED ORDER — PREGABALIN 100 MG PO CAPS: 100.0000 mg | ORAL_CAPSULE | Freq: Three times a day (TID) | ORAL | Status: DC

## 2015-06-06 MED ORDER — LISINOPRIL-HYDROCHLOROTHIAZIDE 10-12.5 MG PO TABS: 1.0000 | ORAL_TABLET | Freq: Every morning | ORAL | Status: DC

## 2015-06-06 NOTE — Discharge Summary (Signed)
[facility] Discharge Summary Note  Patient:  Tracy [name] is an 49 y.o., female MRN:  [id number] DOB:  [birth year] Patient phone:  [phone] (home)  Patient address:   [address] [place] [address],  Total Time spent with patient: 30 minutes  Date of Admission:  06/05/2015 Date of Discharge: 11/1/12016  Reason for Admission:  Suicidal ideation  Principal Problem: Suicidal ideation Discharge Diagnoses: Patient Active Problem List   Diagnosis Date Noted  . MDD (major depressive disorder) ([place]) [F32.9] 06/06/2015  . Severe episode of recurrent major depressive disorder, without psychotic features ([place]) [F33.2]   . History of posttraumatic stress disorder (PTSD) [Z86.59]   . GAD (generalized anxiety disorder) [F41.1]   . Suicidal ideation [R45.851] 06/05/2015  . Prediabetes [R73.03] 02/09/2015  . Insulin resistance [E88.81] 02/09/2015  . 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 Acanthosis nigricans, acquired [L83] 02/09/2015  . Nonalcoholic fatty liver disease [K76.0] 02/09/2015  . Hepatic cirrhosis ([place]) [K74.60] 02/09/2015  . Female hirsutism [L68.0] 02/09/2015  . Oligomenorrhea [N91.5] 02/09/2015  . Infertility associated with anovulation [N97.0] 02/09/2015  . Chest pain at rest [R07.9] 10/31/2014  . Morbid obesity ([place]) [E66.01] 10/31/2014  . Anxiety and depression [F41.8] 10/31/2014  . Chest pain [R07.9] 06/27/2014  . Obstructive sleep apnea [G47.33] 06/27/2014  . Diabetes mellitus, type 2 ([place]) [E11.9] 06/10/2013  . 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[place]) [E23.7] 01/01/2012  . 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 PCOS (polycystic ovarian syndrome) [E28.2]   . Infertility, female [N97.9]   . TOBACCO USE, QUIT [Z87.891] 02/08/2010  . HYPOTHYROIDISM, POSTSURGICAL [E89.0] 10/02/2007  . POLYCYSTIC OVARIAN DISEASE [E28.2] 10/02/2007  . Elevated cholesterol [E78.00] 10/02/2007  . DEPRESSION [F32.9] 10/02/2007  . Migraine [G43.909] 10/02/2007  . Essential  hypertension [I10] 10/02/2007  . HYPERGLYCEMIA [R73.09] 10/02/2007  . UTI'S, HX OF [Z87.448] 10/02/2007  . RENAL CALCULUS, HX OF [Z87.442] 10/02/2007    Musculoskeletal: Strength & Muscle Tone: within normal limits Gait & Station: normal Patient leans: N/A  Psychiatric Specialty Exam: Physical Exam  Review of Systems  Constitutional: Negative.   HENT: Negative.   Eyes: Negative.   Respiratory: Negative.   Cardiovascular: Negative.   Gastrointestinal: Negative.   Genitourinary: Negative.   Musculoskeletal: Negative.   Skin: Negative.   Neurological: Negative.   Endo/Heme/Allergies: Negative.   Psychiatric/Behavioral: Positive for depression (Stable ). Negative for suicidal ideas, hallucinations, memory loss and substance abuse. The patient is not nervous/anxious and does not have insomnia.     Blood pressure 134/86, pulse 81, temperature 98.8 F (37.1 C), temperature source Oral, resp. rate 81, height 5' 11"  (1.803 m), weight 136.079 kg (300 lb), last menstrual period 05/29/2015, SpO2 99 %.Body mass index is 41.86 kg/(m^2).  General Appearance: Casual  Eye Contact::  Good  Speech:  Clear and Coherent  Volume:  Normal  Mood:  Depressed  Affect:  Full Range  Thought Process:  Coherent, Goal Directed and Intact  Orientation:  Full (Time, Place, and Person)  Thought Content:  Rumination  Suicidal Thoughts:  No  Homicidal Thoughts:  No  Memory:  Immediate;   Good Recent;   Good Remote;   Good  Judgement:  Fair  Insight:  Present  Psychomotor Activity:  Normal  Concentration:  Good  Recall:  Good  Fund of Knowledge:Good  Language: Good  Akathisia:  No  Handed:  Right  AIMS (if indicated):     Assets:  Communication Skills Desire for Improvement Financial Resources/Insurance Leisure Time Resilience Social Support Talents/Skills Vocational/Educational  ADL's:  Intact  Cognition: WNL  Sleep:  Number of Hours: 6   Have you used any form of tobacco in the last 30  days? (Cigarettes, Smokeless Tobacco, Cigars, and/or Pipes): Yes  Has this patient used any form of tobacco in the last 30 days? (Cigarettes, Smokeless Tobacco, Cigars, and/or Pipes) Yes, A prescription for an FDA-approved tobacco cessation medication was offered at discharge and the patient refused  Past Medical History:  Past Medical History  Diagnosis Date  . PCOS (polycystic ovarian syndrome)   . History of migraine headaches   . Hypothyroidism   . GERD (gastroesophageal reflux disease)   . History of kidney stones   . Arthritis     back, knees, right elbow  . Anxiety   . High cholesterol     no current med.  . Hypertension     under control with meds., has been on med. x 4 yr.  . Depression   . Achilles tendonitis 02/2013    right  . Acquired Haglund's deformity of right heel 02/2013  . 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 Insulin resistance     does not check blood sugar at home  . History of electroconvulsive therapy   . Dental crowns present   . Diabetes mellitus without complication ([place])   . PONV (postoperative nausea and vomiting)     also hx. of emergence delirium 2007  . Cirrhosis of liver ([place])   . Hepatitis A   . Trigeminal neuralgia   . Neck pain     Past Surgical History  Procedure Laterality Date  . Appendectomy    . Pilonidal cyst excision  1994  . Total thyroidectomy  2003  . Ovarian cyst surgery Left 1998  . Lumbar laminectomy      X 3  . Achilles tendon surgery Left 2007  . Excision haglund's deformity with achilles tendon repair Right 02/25/2013    Procedure: RIGHT ACHILLES DEBRIDEMENT AND RECONSTRUCTION;  HAGLUND'S EXCISION; GASTROC RECESSION AND FLEXOR HALLUCIS LONGUS TRANSFER;  Surgeon: Wylene Simmer, MD;  Location: [facility];  Service: Orthopedics;  Laterality: Right;  . Tubal ligation Right 1984  . Esophagogastroduodenoscopy (egd) with propofol N/A 04/20/2014    Procedure: ESOPHAGOGASTRODUODENOSCOPY (EGD) WITH PROPOFOL;  Surgeon:  Arta Silence, MD;  Location: [facility];  Service: Endoscopy;  Laterality: N/A;  . Colonoscopy with propofol N/A 04/20/2014    Procedure: COLONOSCOPY WITH PROPOFOL;  Surgeon: Arta Silence, MD;  Location: [facility];  Service: Endoscopy;  Laterality: N/A;   Family History:  Family History  Problem Relation Age of Onset  . Breast cancer Mother   . Hypertension Mother   . Heart disease Father   . Depression Father   . Cancer Brother     THROAT  . Depression Brother   . Anxiety disorder Brother   . Depression Sister    Social History:  History  Alcohol Use No    Comment: Has not had any alcohol since 07/2014 - before this date, she rarely drink.     History  Drug Use No    Social History   Social History  . Marital Status: Married    Spouse Name: N/A  . Number of Children: 0  . Years of Education: 16   Occupational History  . RN- PICU    Social History Main Topics  . Smoking status: Current Some Day Smoker -- 0.50 packs/day for 11 years    Types: Cigarettes    Last Attempt to Quit: 08/05/2013  .  Smokeless tobacco: Never Used     Comment: Smokes cigarettes occasionally  . Alcohol Use: No     Comment: Has not had any alcohol since 07/2014 - before this date, she rarely drink.  . Drug Use: No  . Sexual Activity: No   Other Topics Concern  . None   Social History Narrative   RN - [employer]   Regular exercise: no   Caffeine use: 2 x daily   Right-handed.   Lives alone.   Risk to Self: Is patient at risk for suicide?: No Risk to Others:   Prior Inpatient Therapy:   Prior Outpatient Therapy:    Level of Care:  OP  Hospital Course:    Tracy [name] is a 49 y/o WF with long standing history of depression with remote history of associated AH. 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 The patient is treated on an  out-patient basis per Dr Toy Care and is currently on Effexor XR. The patient has been compliant and tolerating the out-patient management but without compensation of worsening depressive symptoms. She is endorsing continued racing thoughts, ruminating, intrusive thoughts, anhedonia and insomnia, sleeping 3 hours daily. The patient is denying any HI, AVH, paranoia or delusions. The patient has a hx of prior ECT one year ago, but stopped treatment due to memory loss. She is denying any history of illicit drug use. Tracy [name] has a history of past SA by drug O/D.  Patient reports improvement in symptoms after receiving Trazodone for insomnia last night. Today during follow up assessment she is denying any suicidal ideation. Patient states "I feel better. I would rate my depression at a three. I never made any attempt. I did look at the gun box but then my puppy jumped on my leg. I have been depressed for a long time. I have never been happy. There are many issues from my childhood that I have not dealt with. I want to work through those issues. I really want to enjoy my life."  Her sister-in-law was contacted by the counselor who reported that all guns were secure and that she felt safe for the patient to discharge home. Tracy [name] denied any active suicidal ideation today and appeared to feel embarrassed about making suicidal comments yesterday. Patient became tearful stating "I would never harm myself." Her thought processes were very future oriented and patient talked about wanting to become more active socially. Patient hopeful that she will feel better in a few weeks when her thyroid is under better control. She reported that her TSH had recently been 41 and that her Endocrine MD was adjusting her Synthroid dosage. Patient felt that later her outpatient Provider may want to make a medication adjustment if her depression continues to worsen. The patient was found stable for discharge due to her improved range of  affect, her denial of suicidal ideation, and from the collateral information that was obtained. Patient was provided with a prescription for Trazodone as she reported it was effective in improving her quality of sleep stating "Normally I just take xanax but then I feel drugged for a long time after." Patient left [facility] with all belongings returned to her and was in stable condition at time of discharge.   Consults:  None  Significant Diagnostic Studies:  Chemistry panel, CBC, Thyroid panel from 05/26/2015 indicating hypothyroidism  Discharge Vitals:   Blood  pressure 134/86, pulse 81, temperature 98.8 F (37.1 C), temperature source Oral, resp. rate 81, height 5' 11"  (1.803 m), weight 136.079 kg (300 lb), last menstrual period 05/29/2015, SpO2 99 %. Body mass index is 41.86 kg/(m^2). Lab Results:   Results for orders placed or performed during the hospital encounter of 06/05/15 (from the past 72 hour(s))  Comprehensive metabolic panel     Status: Abnormal   Collection Time: 06/05/15 11:58 AM  Result Value Ref Range   Sodium 141 135 - 145 mmol/L   Potassium 3.9 3.5 - 5.1 mmol/L   Chloride 103 101 - 111 mmol/L   CO2 25 22 - 32 mmol/L   Glucose, Bld 105 (H) 65 - 99 mg/dL   BUN 13 6 - 20 mg/dL   Creatinine, Ser 0.80 0.44 - 1.00 mg/dL   Calcium 9.5 8.9 - 10.3 mg/dL   Total Protein 6.7 6.5 - 8.1 g/dL   Albumin 3.4 (L) 3.5 - 5.0 g/dL   AST 26 15 - 41 U/L   ALT 23 14 - 54 U/L   Alkaline Phosphatase 86 38 - 126 U/L   Total Bilirubin 0.3 0.3 - 1.2 mg/dL   GFR calc non Af Amer >60 >60 mL/min   GFR calc Af Amer >60 >60 mL/min    Comment: (NOTE) The eGFR has been calculated using the CKD EPI equation. This calculation has not been validated in all clinical situations. eGFR's persistently <60 mL/min signify possible Chronic Kidney Disease.    Anion gap 13 5 - 15  Ethanol     Status: None   Collection Time: 06/05/15 11:58 AM  Result Value Ref Range   Alcohol, Ethyl (B) <5 <5 mg/dL    Comment:         LOWEST DETECTABLE LIMIT FOR SERUM ALCOHOL IS 5 mg/dL FOR MEDICAL PURPOSES ONLY   CBC with Diff     Status: Abnormal   Collection Time: 06/05/15 11:58 AM  Result Value Ref Range   WBC 7.1 4.0 - 10.5 K/uL   RBC 4.63 3.87 - 5.11 MIL/uL   Hemoglobin 12.6 12.0 - 15.0 g/dL   HCT 38.5 36.0 - 46.0 %   MCV 83.2 78.0 - 100.0 fL   MCH 27.2 26.0 - 34.0 pg   MCHC 32.7 30.0 - 36.0 g/dL   RDW 16.0 (H) 11.5 - 15.5 %   Platelets 241 150 - 400 K/uL   Neutrophils Relative % 61 %   Neutro Abs 4.3 1.7 - 7.7 K/uL   Lymphocytes Relative 30 %   Lymphs Abs 2.1 0.7 - 4.0 K/uL   Monocytes Relative 6 %   Monocytes Absolute 0.5 0.1 - 1.0 K/uL   Eosinophils Relative 3 %   Eosinophils Absolute 0.2 0.0 - 0.7 K/uL   Basophils Relative 0 %   Basophils Absolute 0.0 0.0 - 0.1 K/uL    Physical Findings: AIMS: Facial and Oral Movements Muscles of Facial Expression: None, normal Lips and Perioral Area: None, normal Jaw: None, normal Tongue: None, normal,Extremity Movements Upper (arms, wrists, hands, fingers): None, normal Lower (legs, knees, ankles, toes): None, normal, Trunk Movements Neck, shoulders, hips: None, normal, Overall Severity Severity of abnormal movements (highest score from questions above): None, normal Incapacitation due to abnormal movements: None, normal Patient's awareness of abnormal movements (rate only patient's report): No Awareness, Dental Status Current problems with teeth and/or dentures?: No Does patient usually wear dentures?: No  CIWA:    COWS:      See Psychiatric Specialty Exam and Suicide  Risk Assessment completed by Attending Physician prior to discharge.  Discharge destination:  Home  Is patient on multiple antipsychotic therapies at discharge:  No   Has Patient had three or more failed trials of antipsychotic monotherapy by history:  No    Recommended Plan for Multiple Antipsychotic Therapies: NA     Medication List    STOP taking these medications         gabapentin 100 MG capsule  Commonly known as:  NEURONTIN      TAKE these medications      Indication   ALPRAZolam 0.5 MG tablet  Commonly known as:  XANAX  Take 0.25 mg by mouth 3 (three) times daily as needed for anxiety.      lisinopril-hydrochlorothiazide 10-12.5 MG tablet  Commonly known as:  PRINZIDE,ZESTORETIC  Take 1 tablet by mouth every morning.   Indication:  High Blood Pressure     naproxen sodium 220 MG tablet  Commonly known as:  ANAPROX  Take 440 mg by mouth daily as needed (pain).      pregabalin 100 MG capsule  Commonly known as:  LYRICA  Take 1 capsule (100 mg total) by mouth 3 (three) times daily.   Indication:  Neuropathic Pain     SYNTHROID 200 MCG tablet  Generic drug:  levothyroxine  Take 1.5 tablets (300 mcg total) by mouth daily before breakfast. Take 1.5 pills each morning.   Indication:  Underactive Thyroid     traZODone 50 MG tablet  Commonly known as:  DESYREL  Take 1 tablet (50 mg total) by mouth at bedtime and may repeat dose one time if needed.   Indication:  Trouble Sleeping     venlafaxine XR 150 MG 24 hr capsule  Commonly known as:  EFFEXOR-XR  Take 2 capsules (300 mg total) by mouth daily with breakfast.   Indication:  Generalized Anxiety Disorder, Major Depressive Disorder       Follow-up Information    Follow up with Derrel Nip, MD.   Specialty:  Psychiatry   Why:  FOLLOW UP MEDICATION MANAGEMENT   Contact information:   [place] [address] [place] [address] [phone]       Follow up with Doroteo Glassman, MD On 06/20/2015.   Specialty:  Neurology   Why:  THERAPY APPOINTMENT @ 10AM   Contact information:   [address] [place] [address] [phone]       Follow-up recommendations:   As above   Comments:   Take all your medications as prescribed by your mental healthcare provider.  Report any adverse effects and or reactions from your medicines to your  outpatient provider promptly.  Patient is instructed and cautioned to not engage in alcohol and or illegal drug use while on prescription medicines.  In the event of worsening symptoms, patient is instructed to call the crisis hotline, 911 and or go to the nearest ED for appropriate evaluation and treatment of symptoms.  Follow-up with your primary care provider for your other medical issues, concerns and or health care needs.   Total Discharge Time: Greater than 30 minutes  Signed: Elmarie Shiley, NP-C 06/06/2015, 4:14 PM  I personally assessed the patient and formulated the plan Geralyn Flash A. Sabra Heck, M.D.

## 2015-06-06 NOTE — Progress Notes (Signed)
Nursing Discharge Notes:  Patient continues to be cooperative, pleasant, medication compliant, also continues to deny SI/HI/AVH. Patient has no belongings to return; receiving copy of discharge instructions with f/u appointments listed, and also a prescription.  Staff member escorting patient to [place] to meet her sister-in-law picking her up.

## 2015-06-06 NOTE — BHH Counselor (Signed)
[place] Assessment Progress Note  Pt's sister-in-law, Harmon Pier, called back and verified with counselor that she took all of pt's guns out of the house (2 guns, 1 play gun, and 1 be-be gun). She also indicated that she feels that pt will be able to be d/c safely and she will come to pick pt up.   Kenna Gilbert. Lovena Le, [place], [place], LPCA Counselor

## 2015-06-06 NOTE — Progress Notes (Signed)
Nursing Shift Assessment Note:  Patient is cooperative and appropriate in speech, states that depression and anxiety and greatly reduced since her admission and that she no longer has any thoughts of harming herself. Had her sister-in-law remove her two firearms in her house she keeps for protection though she denies any SI; had admitted to [facility] with plan to shoot self at time of admission. Patient expresses worries mostly about her job.Patient denies any SI/HI/AVH, states improvement in depression and anxiety as as well as pain currently rated at "four to five" with a goal of her pain at "three to four". Patient states at her baseline she is always in some degree of pain. Nurse providing therapeutic conversation and approachable and sincere support for patient; patient opening up and talking more and more about her major issues/causes for anxiety and depression. Nurse ensuring q15 minute checks for safety. Patient remains appropriate in conversation and behavior and remains safe on unit.

## 2015-06-06 NOTE — BHH Counselor (Addendum)
[place] Assessment Progress Note  Counselor discussed d/c planning w/ pt. Pt has been generally upbeat, open, and talkative this morning. Pt indicates that she goes to [facility] and wishes to continue to go there. Pt has a hx of seeing Dr. Doroteo Glassman for therapy and indicated that she would like to make an appt. Pt called Dr. Redmond Pulling [phone]) and secured an appt for 11/15th at 10am. Pt also called Dr. Starleen Arms office about a f/u appt. They are to call her back in [place].   Kenna Gilbert. Lovena Le, [place], [place], LPCA Counselor

## 2015-06-06 NOTE — Progress Notes (Signed)
Pt had been up and spoke of events leading up to hospitalization. Pt was tearful at times and has appeared sad and depressed and has also endorsed anxiety. Pt spoke about her need to be here to help with her on-going issues. Pt was cooperative throughout the evening and did receive all medications without incident.

## 2015-06-06 NOTE — B[facility] Counselor (Signed)
Tracy [name]  Tracy Shiley, NP, evaluated pt and consulted w/ Dr. Sabra Heck and it was determined that pt can be d/c upon verification that the guns at pt's home has been secured and collateral information supports pt being d/c safely. Pt gave counselor her sister-in-law's info Tracy [name] [phone]), as she was the one pt asked to secure her guns. Pt indicated that Tracy [name] was in classes until about 12 or 1pm so counselor was able to leave Tracy [name] a message, requesting a call back.   Kenna Gilbert. Lovena Le, [place], [place], LPCA Counselor

## 2015-06-06 NOTE — H&P (Signed)
Psychiatric Admission Assessment Adult  Patient Identification: Tracy [name] MRN:  [id number] Date of Evaluation:  06/06/2015 Chief Complaint:  DEPRESSION Principal Diagnosis: MDD recurrent severe Diagnosis:  GAD,PTSD,Social phobia Patient Active Problem List   Diagnosis Date Noted  . Suicidal ideation [R45.851] 06/05/2015  . Prediabetes [R73.03] 02/09/2015  . Insulin resistance [E88.81] 02/09/2015  . 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 Acanthosis nigricans, acquired [L83] 02/09/2015  . Nonalcoholic fatty liver disease [K76.0] 02/09/2015  . Hepatic cirrhosis ([place]) [K74.60] 02/09/2015  . Female hirsutism [L68.0] 02/09/2015  . Oligomenorrhea [N91.5] 02/09/2015  . Infertility associated with anovulation [N97.0] 02/09/2015  . Chest pain at rest [R07.9] 10/31/2014  . Morbid obesity ([place]) [E66.01] 10/31/2014  . Anxiety and depression [F41.8] 10/31/2014  . Chest pain [R07.9] 06/27/2014  . Obstructive sleep apnea [G47.33] 06/27/2014  . Diabetes mellitus, type 2 ([place]) [E11.9] 06/10/2013  . 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[place]) [E23.7] 01/01/2012  . 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 PCOS (polycystic ovarian syndrome) [E28.2]   . Infertility, female [N97.9]   . TOBACCO USE, QUIT [Z87.891] 02/08/2010  . HYPOTHYROIDISM, POSTSURGICAL [E89.0] 10/02/2007  . POLYCYSTIC OVARIAN DISEASE [E28.2] 10/02/2007  . Elevated cholesterol [E78.00] 10/02/2007  . DEPRESSION [F32.9] 10/02/2007  . Migraine [G43.909] 10/02/2007  . Essential hypertension [I10] 10/02/2007  . HYPERGLYCEMIA [R73.09] 10/02/2007  . UTI'S, HX OF [Z87.448] 10/02/2007  . RENAL CALCULUS, HX OF [Z87.442] 10/02/2007   History of Present Illness: Patient is a 49 y/o WF with long standing history of depression with remote history of associated AH. 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 The patient is  treated on an out-patient basis per Dr Wylene Simmer and is currently on Effexor. The patient has been compliant and tolerating the out-patient management but without compensation of worsening depressive symptoms. She is endorsing continued racing thoughts, ruminating, intrusive thoughts, anhedonia and insomnia, sleeping 3 hours daily. The patient is denying any HI, AVH, paranoia or delusions. The patient has a hx of prior ECT one year ago, but stopped treatment due to memory loss. She is denying any history of illicit drug use. Mrs. Buford Dresser does have a history of past SA by drug O/D. Associated Signs/Symptoms: Depression Symptoms:  anhedonia, insomnia, hopelessness, suicidal thoughts with specific plan, (Hypo) Manic Symptoms:  denies lack of need for sleep, grandiosity, rapid cycling Anxiety Symptoms:  Excessive Worry, Panic Symptoms, Specific Phobias, Psychotic Symptoms:  denies AVH,paranoia or delusional thoughts PTSD Symptoms: Had a traumatic exposure:  unknown Total Time spent with patient: 30 minutes  Past Psychiatric History: (see HPI)  Risk to Self: Is patient at risk for suicide?: No Risk to Others:   Prior Inpatient Therapy:   Prior Outpatient Therapy:    Alcohol Screening: Patient refused Alcohol Screening Tool: Yes 1. How often do you have a drink containing alcohol?: Never 9. Have you or someone else been injured as a result of your drinking?: No 10. Has a relative or friend or a doctor or another health worker been concerned about your drinking or suggested you cut down?: No Alcohol Use Disorder Identification Test Final Score (AUDIT): 0 Brief Intervention: Patient declined brief intervention Substance Abuse History in the last 12 months:  No. Consequences of Substance Abuse: NA Previous Psychotropic Medications: Yes  Psychological Evaluations: Yes  Past Medical History:  Past Medical History  Diagnosis Date  . PCOS (polycystic ovarian syndrome)   . History of migraine  headaches   .  Hypothyroidism   . GERD (gastroesophageal reflux disease)   . History of kidney stones   . Arthritis     back, knees, right elbow  . Anxiety   . High cholesterol     no current med.  . Hypertension     under control with meds., has been on med. x 4 yr.  . Depression   . Achilles tendonitis 02/2013    right  . Acquired Haglund's deformity of right heel 02/2013  . 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 Insulin resistance     does not check blood sugar at home  . History of electroconvulsive therapy   . Dental crowns present   . Diabetes mellitus without complication ([place])   . PONV (postoperative nausea and vomiting)     also hx. of emergence delirium 2007  . Cirrhosis of liver ([place])   . Hepatitis A   . Trigeminal neuralgia   . Neck pain     Past Surgical History  Procedure Laterality Date  . Appendectomy    . Pilonidal cyst excision  1994  . Total thyroidectomy  2003  . Ovarian cyst surgery Left 1998  . Lumbar laminectomy      X 3  . Achilles tendon surgery Left 2007  . Excision haglund's deformity with achilles tendon repair Right 02/25/2013    Procedure: RIGHT ACHILLES DEBRIDEMENT AND RECONSTRUCTION;  HAGLUND'S EXCISION; GASTROC RECESSION AND FLEXOR HALLUCIS LONGUS TRANSFER;  Surgeon: Wylene Simmer, MD;  Location: [facility];  Service: Orthopedics;  Laterality: Right;  . Tubal ligation Right 1984  . Esophagogastroduodenoscopy (egd) with propofol N/A 04/20/2014    Procedure: ESOPHAGOGASTRODUODENOSCOPY (EGD) WITH PROPOFOL;  Surgeon: Arta Silence, MD;  Location: [facility];  Service: Endoscopy;  Laterality: N/A;  . Colonoscopy with propofol N/A 04/20/2014    Procedure: COLONOSCOPY WITH PROPOFOL;  Surgeon: Arta Silence, MD;  Location: [facility];  Service: Endoscopy;  Laterality: N/A;   Family History:  Family History  Problem Relation Age of Onset  . Breast cancer Mother   . Hypertension Mother   . Heart disease Father   . Depression  Father   . Cancer Brother     THROAT  . Depression Brother   . Anxiety disorder Brother   . Depression Sister    Family Psychiatric  History: Father MDD with psychotic features Social History: married , with no children, husband long distance trucker History  Alcohol Use No    Comment: Has not had any alcohol since 07/2014 - before this date, she rarely drink.     History  Drug Use No    Social History   Social History  . Marital Status: Married    Spouse Name: N/A  . Number of Children: 0  . Years of Education: 16   Occupational History  . RN- PICU    Social History Main Topics  . Smoking status: Current Some Day Smoker -- 0.50 packs/day for 11 years    Types: Cigarettes    Last Attempt to Quit: 08/05/2013  . Smokeless tobacco: Never Used     Comment: Smokes cigarettes occasionally  . Alcohol Use: No     Comment: Has not had any alcohol since 07/2014 - before this date, she rarely drink.  . Drug Use: No  . Sexual Activity: No   Other Topics Concern  . None   Social History Narrative   RN - [employer]   Regular exercise: no   Caffeine use: 2 x daily  Right-handed.   Lives alone.   Additional Social History:                         Allergies:   Allergies  Allergen Reactions  . Sulfa Antibiotics Itching, Swelling and Rash  . Bupropion Rash  . Bupropion Hcl Itching and Rash  . Codeine Itching  . Sulfamethoxazole-Trimethoprim Rash   Lab Results:  Results for orders placed or performed during the hospital encounter of 06/05/15 (from the past 48 hour(s))  Comprehensive metabolic panel     Status: Abnormal   Collection Time: 06/05/15 11:58 AM  Result Value Ref Range   Sodium 141 135 - 145 mmol/L   Potassium 3.9 3.5 - 5.1 mmol/L   Chloride 103 101 - 111 mmol/L   CO2 25 22 - 32 mmol/L   Glucose, Bld 105 (H) 65 - 99 mg/dL   BUN 13 6 - 20 mg/dL   Creatinine, Ser 0.80 0.44 - 1.00 mg/dL   Calcium 9.5 8.9 - 10.3 mg/dL   Total Protein 6.7 6.5 -  8.1 g/dL   Albumin 3.4 (L) 3.5 - 5.0 g/dL   AST 26 15 - 41 U/L   ALT 23 14 - 54 U/L   Alkaline Phosphatase 86 38 - 126 U/L   Total Bilirubin 0.3 0.3 - 1.2 mg/dL   GFR calc non Af Amer >60 >60 mL/min   GFR calc Af Amer >60 >60 mL/min    Comment: (NOTE) The eGFR has been calculated using the CKD EPI equation. This calculation has not been validated in all clinical situations. eGFR's persistently <60 mL/min signify possible Chronic Kidney Disease.    Anion gap 13 5 - 15  Ethanol     Status: None   Collection Time: 06/05/15 11:58 AM  Result Value Ref Range   Alcohol, Ethyl (B) <5 <5 mg/dL    Comment:        LOWEST DETECTABLE LIMIT FOR SERUM ALCOHOL IS 5 mg/dL FOR MEDICAL PURPOSES ONLY   CBC with Diff     Status: Abnormal   Collection Time: 06/05/15 11:58 AM  Result Value Ref Range   WBC 7.1 4.0 - 10.5 K/uL   RBC 4.63 3.87 - 5.11 MIL/uL   Hemoglobin 12.6 12.0 - 15.0 g/dL   HCT 38.5 36.0 - 46.0 %   MCV 83.2 78.0 - 100.0 fL   MCH 27.2 26.0 - 34.0 pg   MCHC 32.7 30.0 - 36.0 g/dL   RDW 16.0 (H) 11.5 - 15.5 %   Platelets 241 150 - 400 K/uL   Neutrophils Relative % 61 %   Neutro Abs 4.3 1.7 - 7.7 K/uL   Lymphocytes Relative 30 %   Lymphs Abs 2.1 0.7 - 4.0 K/uL   Monocytes Relative 6 %   Monocytes Absolute 0.5 0.1 - 1.0 K/uL   Eosinophils Relative 3 %   Eosinophils Absolute 0.2 0.0 - 0.7 K/uL   Basophils Relative 0 %   Basophils Absolute 0.0 0.0 - 0.1 K/uL    Metabolic Disorder Labs:  Lab Results  Component Value Date   HGBA1C 6.5 04/20/2015   Lab Results  Component Value Date   PROLACTIN 6.2 01/03/2012   Lab Results  Component Value Date   CHOL 192 05/26/2015   TRIG 140 05/26/2015   HDL 35* 05/26/2015   CHOLHDL 5.5* 05/26/2015   VLDL 28 05/26/2015   LDLCALC 129 05/26/2015    Current Medications: Current Facility-Administered Medications  Medication Dose Route Frequency  Provider Last Rate Last Dose  . acetaminophen (TYLENOL) tablet 650 mg  650 mg Oral Q6H PRN  Niel Hummer, NP   650 mg at 06/05/15 1816  . ALPRAZolam Duanne Moron) tablet 0.25 mg  0.25 mg Oral TID PRN Laverle Hobby, PA-C      . alum & mag hydroxide-simeth (MAALOX/MYLANTA) 200-200-20 MG/5ML suspension 30 mL  30 mL Oral Q4H PRN Niel Hummer, NP      . hydrOXYzine (ATARAX/VISTARIL) tablet 25 mg  25 mg Oral Q6H PRN Niel Hummer, NP   25 mg at 06/05/15 1816  . levothyroxine (SYNTHROID, LEVOTHROID) tablet 300 mcg  300 mcg Oral QAC breakfast Laverle Hobby, PA-C      . magnesium hydroxide (MILK OF MAGNESIA) suspension 30 mL  30 mL Oral Daily PRN Niel Hummer, NP      . naproxen (NAPROSYN) tablet 500 mg  500 mg Oral Daily PRN [name] Abbot, MD      . pregabalin (LYRICA) capsule 100 mg  100 mg Oral TID Laverle Hobby, PA-C   100 mg at 06/05/15 2121  . traZODone (DESYREL) tablet 50 mg  50 mg Oral QHS,MR X 1 Laverle Hobby, PA-C   50 mg at 06/05/15 2121  . venlafaxine XR (EFFEXOR-XR) 24 hr capsule 300 mg  300 mg Oral Q breakfast Laverle Hobby, PA-C       PTA Medications: Prescriptions prior to admission  Medication Sig Dispense Refill Last Dose  . ALPRAZolam (XANAX) 0.5 MG tablet Take 0.25 mg by mouth 3 (three) times daily as needed for anxiety.    06/04/2015 at Unknown time  . gabapentin (NEURONTIN) 100 MG capsule 3 tablets 3 times a day (Patient not taking: Reported on 06/05/2015) 270 capsule 3 Taking  . lisinopril-hydrochlorothiazide (PRINZIDE,ZESTORETIC) 10-12.5 MG per tablet Take 1 tablet by mouth every morning.    06/04/2015 at Unknown time  . naproxen sodium (ANAPROX) 220 MG tablet Take 440 mg by mouth daily as needed (pain).   06/04/2015 at Unknown time  . pregabalin (LYRICA) 100 MG capsule Take 100 mg by mouth 3 (three) times daily.   06/04/2015 at Unknown time  . SYNTHROID 200 MCG tablet Take 1.5 pills each morning. (Patient taking differently: Take 300 mcg by mouth daily before breakfast. Take 1.5 pills each morning.) 135 tablet 3   . venlafaxine XR (EFFEXOR-XR) 150 MG 24 hr capsule  Take 300 mg by mouth daily with breakfast.    06/04/2015 at Unknown time    Musculoskeletal: Strength & Muscle Tone: within normal limits Gait & Station: normal Patient leans: N/A  Psychiatric Specialty Exam: Physical Exam  Nursing note and vitals reviewed. Constitutional: She is oriented to person, place, and time. She appears well-developed and well-nourished.  HENT:  Head: Normocephalic.  Eyes: Pupils are equal, round, and reactive to light.  Neurological: She is alert and oriented to person, place, and time. No cranial nerve deficit.  Skin: Skin is warm and dry.    Review of Systems  Constitutional: Negative.   Eyes: Negative.   Respiratory: Negative.   Cardiovascular: Negative.   Musculoskeletal: Positive for myalgias and back pain.  Skin: Negative.   Neurological: Positive for headaches. Negative for tremors, focal weakness and seizures.  Psychiatric/Behavioral: Positive for depression and suicidal ideas. The patient is nervous/anxious.     Blood pressure 120/76, pulse 82, temperature 98.6 F (37 C), temperature source Oral, resp. rate 20, height 5' 11"  (1.803 m), weight 136.079 kg (300 lb), last menstrual  period 05/29/2015, SpO2 99 %.Body mass index is 41.86 kg/(m^2).  General Appearance: Casual  Eye Contact::  Good  Speech:  Clear and Coherent  Volume:  Normal  Mood:  Depressed  Affect:  Congruent  Thought Process:  Circumstantial  Orientation:  Full (Time, Place, and Person)  Thought Content:  Negative  Suicidal Thoughts:  Yes.  with intent/plan  Homicidal Thoughts:  No  Memory:  Immediate;   Good  Judgement:  Fair  Insight:  Fair  Psychomotor Activity:  Normal  Concentration:  Good  Recall:  Good  Fund of Knowledge:Good  Language: Good  Akathisia:  Negative  Handed:  Right  AIMS (if indicated):     Assets:  Desire for Improvement  ADL's:  Intact  Cognition: WNL  Sleep:        Treatment Plan Summary: Plan admitted to [facility] observation unit for  continuos observation, preserve safety, crises intervention and stabilization. Further disposition per TTS Staff in am.  Observation Level/Precautions:  Continuous Observation  Laboratory:  N/A  Psychotherapy:    Medications:  Restart home medications  Consultations:    Discharge Concerns:    Estimated LOS: 24 - 48 hours  Other:     I certify that inpatient services furnished can reasonably be expected to improve the patient's condition.   Shireen Rayburn E 11/1/201612:18 AM

## 2015-06-09 ENCOUNTER — Telehealth: Payer: Self-pay | Admitting: "Endocrinology

## 2015-06-09 DIAGNOSIS — E890 Postprocedural hypothyroidism: Secondary | ICD-10-CM

## 2015-06-09 MED ORDER — SYNTHROID 200 MCG PO TABS: ORAL_TABLET | ORAL | Status: DC

## 2015-06-09 NOTE — Telephone Encounter (Signed)
1. Patient called to state that when she tried to have her prescription for Synthroid filled at the hospital pharmacy, they wanted to give her levothyroxine.  2. I checked on the prescription written on 06/06/15. The prescription was written correctly, I re-wrote the prescription to state "brand Synthroid" in the sig. [name]

## 2015-06-20 ENCOUNTER — Telehealth: Payer: Self-pay | Admitting: Neurology

## 2015-06-20 NOTE — Telephone Encounter (Signed)
Called and LVM asking pt to call back to r/s NCV/EMG.-SLB

## 2015-07-21 ENCOUNTER — Telehealth: Payer: Self-pay | Admitting: Neurology

## 2015-07-21 NOTE — Telephone Encounter (Signed)
Patient called to advise she has been having facial twitching both sides of face (Left eye, right side of mouth and right cheek), just wanted to make Dr. Krista Blue aware so that they could discuss at her next visit.

## 2015-07-21 NOTE — Telephone Encounter (Signed)
Reviewed, will see patient had follow-up visit February first 2017

## 2015-07-25 NOTE — Telephone Encounter (Signed)
Called Tracy [name] back - left message letting her know Dr. Krista Blue is aware of her concerns and will address at next appointment.

## 2015-08-06 ENCOUNTER — Emergency Department (HOSPITAL_COMMUNITY)
Admission: EM | Admit: 2015-08-06 | Discharge: 2015-08-07 | Disposition: A | Discharge status: Home / Self Care | Payer: 59 | Attending: Emergency Medicine | Admitting: Emergency Medicine

## 2015-08-06 ENCOUNTER — Emergency Department (HOSPITAL_COMMUNITY): Payer: 59

## 2015-08-06 ENCOUNTER — Encounter (HOSPITAL_COMMUNITY): Payer: Self-pay | Admitting: Emergency Medicine

## 2015-08-06 DIAGNOSIS — M19021 Primary osteoarthritis, right elbow: Secondary | ICD-10-CM | POA: Insufficient documentation

## 2015-08-06 DIAGNOSIS — R112 Nausea with vomiting, unspecified: Secondary | ICD-10-CM | POA: Insufficient documentation

## 2015-08-06 DIAGNOSIS — Z87442 Personal history of urinary calculi: Secondary | ICD-10-CM | POA: Insufficient documentation

## 2015-08-06 DIAGNOSIS — F419 Anxiety disorder, unspecified: Secondary | ICD-10-CM | POA: Diagnosis not present

## 2015-08-06 DIAGNOSIS — Z79899 Other long term (current) drug therapy: Secondary | ICD-10-CM | POA: Insufficient documentation

## 2015-08-06 DIAGNOSIS — R197 Diarrhea, unspecified: Secondary | ICD-10-CM | POA: Insufficient documentation

## 2015-08-06 DIAGNOSIS — K219 Gastro-esophageal reflux disease without esophagitis: Secondary | ICD-10-CM | POA: Insufficient documentation

## 2015-08-06 DIAGNOSIS — G43909 Migraine, unspecified, not intractable, without status migrainosus: Secondary | ICD-10-CM | POA: Insufficient documentation

## 2015-08-06 DIAGNOSIS — M47819 Spondylosis without myelopathy or radiculopathy, site unspecified: Secondary | ICD-10-CM | POA: Insufficient documentation

## 2015-08-06 DIAGNOSIS — R079 Chest pain, unspecified: Secondary | ICD-10-CM | POA: Insufficient documentation

## 2015-08-06 DIAGNOSIS — E119 Type 2 diabetes mellitus without complications: Secondary | ICD-10-CM | POA: Insufficient documentation

## 2015-08-06 DIAGNOSIS — Z8619 Personal history of other infectious and parasitic diseases: Secondary | ICD-10-CM | POA: Diagnosis not present

## 2015-08-06 DIAGNOSIS — M170 Bilateral primary osteoarthritis of knee: Secondary | ICD-10-CM | POA: Insufficient documentation

## 2015-08-06 DIAGNOSIS — F17210 Nicotine dependence, cigarettes, uncomplicated: Secondary | ICD-10-CM | POA: Insufficient documentation

## 2015-08-06 DIAGNOSIS — R1011 Right upper quadrant pain: Secondary | ICD-10-CM | POA: Insufficient documentation

## 2015-08-06 DIAGNOSIS — I10 Essential (primary) hypertension: Secondary | ICD-10-CM | POA: Diagnosis not present

## 2015-08-06 DIAGNOSIS — Z8669 Personal history of other diseases of the nervous system and sense organs: Secondary | ICD-10-CM | POA: Insufficient documentation

## 2015-08-06 DIAGNOSIS — E039 Hypothyroidism, unspecified: Secondary | ICD-10-CM | POA: Insufficient documentation

## 2015-08-06 DIAGNOSIS — R0789 Other chest pain: Secondary | ICD-10-CM | POA: Diagnosis not present

## 2015-08-06 LAB — COMPREHENSIVE METABOLIC PANEL
ALBUMIN: 3.5 g/dL (ref 3.5–5.0)
ALT: 31 U/L (ref 14–54)
AST: 35 U/L (ref 15–41)
Alkaline Phosphatase: 72 U/L (ref 38–126)
Anion gap: 9 (ref 5–15)
BILIRUBIN TOTAL: 0.7 mg/dL (ref 0.3–1.2)
BUN: 14 mg/dL (ref 6–20)
CHLORIDE: 106 mmol/L (ref 101–111)
CO2: 21 mmol/L — ABNORMAL LOW (ref 22–32)
Calcium: 8.8 mg/dL — ABNORMAL LOW (ref 8.9–10.3)
Creatinine, Ser: 0.9 mg/dL (ref 0.44–1.00)
GFR calc Af Amer: >60 mL/min (ref >60)
GFR calc non Af Amer: >60 mL/min (ref >60)
GLUCOSE: 93 mg/dL (ref 65–99)
POTASSIUM: 3.8 mmol/L (ref 3.5–5.1)
Sodium: 136 mmol/L (ref 135–145)
Total Protein: 7 g/dL (ref 6.5–8.1)

## 2015-08-06 LAB — CBC
HEMATOCRIT: 36.8 % (ref 36.0–46.0)
Hemoglobin: 11.8 g/dL — ABNORMAL LOW (ref 12.0–15.0)
MCH: 26 pg (ref 26.0–34.0)
MCHC: 32.1 g/dL (ref 30.0–36.0)
MCV: 81.1 fL (ref 78.0–100.0)
Platelets: 184 10*3/uL (ref 150–400)
RBC: 4.54 MIL/uL (ref 3.87–5.11)
RDW: 17.1 % — AB (ref 11.5–15.5)
WBC: 6 10*3/uL (ref 4.0–10.5)

## 2015-08-06 LAB — URINALYSIS, ROUTINE W REFLEX MICROSCOPIC
GLUCOSE, UA: NEGATIVE mg/dL
Hgb urine dipstick: NEGATIVE
KETONES UR: NEGATIVE mg/dL
Leukocytes, UA: NEGATIVE
Nitrite: NEGATIVE
PH: 5 (ref 5.0–8.0)
Protein, ur: NEGATIVE mg/dL
Specific Gravity, Urine: 1.03 (ref 1.005–1.030)

## 2015-08-06 LAB — I-STAT TROPONIN, ED: Troponin i, poc: 0 ng/mL (ref 0.00–0.08)

## 2015-08-06 LAB — LIPASE, BLOOD: LIPASE: 50 U/L (ref 11–51)

## 2015-08-06 MED ORDER — MORPHINE SULFATE (PF) 4 MG/ML IV SOLN
4.0000 mg | Freq: Once | INTRAVENOUS | Status: AC
  Administered 2015-08-06: 4 mg via INTRAVENOUS
  Filled 2015-08-06: qty 1

## 2015-08-06 MED ORDER — GI COCKTAIL ~~LOC~~
30.0000 mL | Freq: Once | ORAL | Status: AC
  Administered 2015-08-06: 30 mL via ORAL
  Filled 2015-08-06: qty 30

## 2015-08-06 MED ORDER — ONDANSETRON HCL 4 MG/2ML IJ SOLN
4.0000 mg | Freq: Once | INTRAMUSCULAR | Status: AC
  Administered 2015-08-06: 4 mg via INTRAVENOUS
  Filled 2015-08-06: qty 2

## 2015-08-06 MED ORDER — SODIUM CHLORIDE 0.9 % IV BOLUS (SEPSIS)
1000.0000 mL | Freq: Once | INTRAVENOUS | Status: AC
  Administered 2015-08-06: 1000 mL via INTRAVENOUS

## 2015-08-06 NOTE — ED Notes (Signed)
C/o constant dull RUQ pain that radiates to R flank since waking up this morning.  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  Took Zofran with some relief of nausea.

## 2015-08-06 NOTE — ED Provider Notes (Signed)
CSN: [id number]     Arrival date & time 08/06/15  2020 History   By signing my name below, I, Forrestine Him, attest that this documentation has been prepared under the direction and in the presence of Deno Etienne, DO.  Electronically Signed: Forrestine Him, ED Scribe. 08/06/2015. 11:39 PM.   Chief Complaint  Patient presents with  . Abdominal Pain  . Chest Pain   The history is provided by the patient. No language interpreter was used.    HPI Comments: Cecile Macadangdang is a 50 y.o. female with a PMHx of hyperlipidemia, HTN, kidney stones, and DM who presents to the Emergency Department complaining of waxing and waning, ongoing RUQ abdominal pain that radiates to the R flank after waking form sleep this morning; but more so constant in the last few hours. No recent injury or trauma. Pain is described as dull. Abdominal pain is exacerbated with deep palpation without any alleviating factors. She also reports L sided chest discomfort, nausea, vomiting, and diarrhea. Chest pain is intermittent lasting 10 seconds at a time. She is unable to described the chest pain at this time. Prescribed Zofran and OTC Naproxen attempted prior to arrival without any improvement. No recent fever or chills.  PCP: Rachell Cipro, MD    Past Medical History  Diagnosis Date  . PCOS (polycystic ovarian syndrome)   . History of migraine headaches   . Hypothyroidism   . GERD (gastroesophageal reflux disease)   . History of kidney stones   . Arthritis     back, knees, right elbow  . Anxiety   . High cholesterol     no current med.  . Hypertension     under control with meds., has been on med. x 4 yr.  . Depression   . Achilles tendonitis 02/2013    right  . Acquired Haglund's deformity of right heel 02/2013  . 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 Insulin resistance     does not check blood sugar at home  . History of electroconvulsive therapy   . Dental crowns present   . Diabetes mellitus without  complication ([place])   . PONV (postoperative nausea and vomiting)     also hx. of emergence delirium 2007  . Cirrhosis of liver ([place])   . Hepatitis A   . Trigeminal neuralgia   . Neck pain    Past Surgical History  Procedure Laterality Date  . Appendectomy    . Pilonidal cyst excision  1994  . Total thyroidectomy  2003  . Ovarian cyst surgery Left 1998  . Lumbar laminectomy      X 3  . Achilles tendon surgery Left 2007  . Excision haglund's deformity with achilles tendon repair Right 02/25/2013    Procedure: RIGHT ACHILLES DEBRIDEMENT AND RECONSTRUCTION;  HAGLUND'S EXCISION; GASTROC RECESSION AND FLEXOR HALLUCIS LONGUS TRANSFER;  Surgeon: Wylene Simmer, MD;  Location: [facility];  Service: Orthopedics;  Laterality: Right;  . Tubal ligation Right 1984  . Esophagogastroduodenoscopy (egd) with propofol N/A 04/20/2014    Procedure: ESOPHAGOGASTRODUODENOSCOPY (EGD) WITH PROPOFOL;  Surgeon: Arta Silence, MD;  Location: [facility];  Service: Endoscopy;  Laterality: N/A;  . Colonoscopy with propofol N/A 04/20/2014    Procedure: COLONOSCOPY WITH PROPOFOL;  Surgeon: Arta Silence, MD;  Location: [facility];  Service: Endoscopy;  Laterality: N/A;   Family History  Problem Relation Age of Onset  . Breast cancer Mother   . Hypertension Mother   . Heart disease Father   .  Depression Father   . Cancer Brother     THROAT  . Depression Brother   . Anxiety disorder Brother   . Depression Sister    Social History  Substance Use Topics  . Smoking status: Current Some Day Smoker -- 0.50 packs/day for 11 years    Types: Cigarettes    Last Attempt to Quit: 08/05/2013  . Smokeless tobacco: Never Used     Comment: Smokes cigarettes occasionally  . Alcohol Use: No     Comment: Has not had any alcohol since 07/2014 - before this date, she rarely drink.   OB History    Gravida Para Term Preterm AB TAB SAB Ectopic Multiple Living   0              Review of Systems   Constitutional: Negative for fever and chills.  HENT: Negative for congestion and rhinorrhea.   Eyes: Negative for redness and visual disturbance.  Respiratory: Negative for shortness of breath and wheezing.   Cardiovascular: Positive for chest pain. Negative for palpitations.  Gastrointestinal: Positive for nausea, vomiting and abdominal pain.  Genitourinary: Positive for flank pain. Negative for dysuria and urgency.  Musculoskeletal: Negative for myalgias and arthralgias.  Skin: Negative for pallor and wound.  Neurological: Negative for dizziness and headaches.      Allergies  Sulfa antibiotics; Bupropion; Bupropion hcl; Codeine; and Sulfamethoxazole-trimethoprim  Home Medications   Prior to Admission medications   Medication Sig Start Date End Date Taking? Authorizing Provider  lisinopril-hydrochlorothiazide (PRINZIDE,ZESTORETIC) 10-12.5 MG tablet Take 1 tablet by mouth every morning. 06/06/15  Yes Niel Hummer, NP  naproxen sodium (ANAPROX) 220 MG tablet Take 440 mg by mouth 2 (two) times daily as needed (pain).    Yes Historical Provider, MD  ranitidine (ZANTAC) 150 MG tablet Take 150 mg by mouth 2 (two) times daily.   Yes Historical Provider, MD  SYNTHROID 200 MCG tablet Take 1.5 of brand Synthroid, 200 mcg tablets each morning. Patient taking differently: Take 200 mcg by mouth daily before breakfast. Take 1.5 of brand Synthroid, 200 mcg tablets each morning.-Total of 345mcg/day 06/09/15 06/08/16 Yes [name], MD  venlafaxine XR (EFFEXOR-XR) 150 MG 24 hr capsule Take 2 capsules (300 mg total) by mouth daily with breakfast. 06/06/15  Yes Niel Hummer, NP  ondansetron (ZOFRAN ODT) 4 MG disintegrating tablet Take 1 tablet (4 mg total) by mouth every 8 (eight) hours as needed for nausea or vomiting. 08/07/15   Deno Etienne, DO  oxyCODONE (ROXICODONE) 5 MG immediate release tablet Take 0.5 tablets (2.5 mg total) by mouth every 4 (four) hours as needed for severe pain. 08/07/15   Deno Etienne, DO  pregabalin (LYRICA) 100 MG capsule Take 1 capsule (100 mg total) by mouth 3 (three) times daily. Patient not taking: Reported on 08/07/2015 06/06/15   Niel Hummer, NP  traZODone (DESYREL) 50 MG tablet Take 1 tablet (50 mg total) by mouth at bedtime and may repeat dose one time if needed. Patient not taking: Reported on 08/07/2015 06/06/15   Kerrie Buffalo, NP   Triage Vitals: BP 127/82 mmHg  Pulse 78  Temp(Src) 98.6 F (37 C) (Oral)  Resp 16  Ht 5\' 11"  (1.803 m)  Wt 321 lb (145.605 kg)  BMI 44.79 kg/m2  SpO2 98%  LMP 07/31/2015   Physical Exam  Constitutional: She is oriented to person, place, and time. She appears well-developed and well-nourished. No distress.  HENT:  Head: Normocephalic and atraumatic.  Eyes: EOM are  normal. Pupils are equal, round, and reactive to light.  Neck: Normal range of motion. Neck supple.  Cardiovascular: Normal rate and regular rhythm.  Exam reveals no gallop and no friction rub.   No murmur heard. Pulmonary/Chest: Effort normal. She has no wheezes. She has no rales.  Abdominal: Soft. She exhibits no distension. There is tenderness.  Tenderness to palpation; worse in RUQ Difficult to determine Murphy's sign based on pt compliance   After listening this tenderness patient states that it hurts there all the time. When palpated along the lateral aspect of the ribs also had significant tenderness.  Difficult to localize  Musculoskeletal: She exhibits no edema or tenderness.  Neurological: She is alert and oriented to person, place, and time.  Skin: Skin is warm and dry. She is not diaphoretic.  Psychiatric: She has a normal mood and affect. Her behavior is normal.  Nursing note and vitals reviewed.   ED Course  Procedures (including critical care time)  DIAGNOSTIC STUDIES: Oxygen Saturation is 96% on RA, adequate by my interpretation.    COORDINATION OF CARE: 11:25 PM- Will give GI cocktail, Morphine, Zofran, and fluids. Will order  Lipase, CMP, CBC, Urinalysis, I-stat troponin, and EKG. Discussed treatment plan with pt at bedside and pt agreed to plan.     2:54 AM- Updated pt on labs and imaging results. Will recommend pt to follow with general surgeon.  Labs Review Labs Reviewed  COMPREHENSIVE METABOLIC PANEL - Abnormal; Notable for the following:    CO2 21 (*)    Calcium 8.8 (*)    All other components within normal limits  CBC - Abnormal; Notable for the following:    Hemoglobin 11.8 (*)    RDW 17.1 (*)    All other components within normal limits  URINALYSIS, ROUTINE W REFLEX MICROSCOPIC (NOT AT [facility]) - Abnormal; Notable for the following:    Color, Urine AMBER (*)    Bilirubin Urine SMALL (*)    All other components within normal limits  LIPASE, BLOOD  I-STAT TROPOININ, ED    Imaging Review Dg Chest 2 View  08/06/2015  CLINICAL DATA:  Initial evaluation for acute right upper abdominal pain with mid chest pain. EXAM: CHEST  2 VIEW COMPARISON:  Prior study from 10/31/2014. FINDINGS: The cardiac and mediastinal silhouettes are stable in size and contour, and remain within normal limits. The lungs are normally inflated. Mild interstitial prominence, stable. No airspace consolidation, pleural effusion, or pulmonary edema is identified. There is no pneumothorax. No acute osseous abnormality identified. IMPRESSION: No active cardiopulmonary disease. Electronically Signed   By: Jeannine Boga M.D.   On: 08/06/2015 21:31   I have personally reviewed and evaluated these images and lab results as part of my medical decision-making.   EKG Interpretation   Date/Time:  Sunday August 06 2015 20:37:00 EST Ventricular Rate:  93 PR Interval:  156 QRS Duration: 82 QT Interval:  372 QTC Calculation: 462 R Axis:   74 Text Interpretation:  Normal sinus rhythm Cannot rule out Anterior infarct  , age undetermined Abnormal ECG No significant change since last tracing  Confirmed by Naseem Varden MD, DANIEL [phone]) on 08/06/2015  11:23:29 PM      MDM   Final diagnoses:  Right upper quadrant pain    50 yo F with a chief complaints of right upper quadrant pain. This pain radiates to her back. States it feels like a prior kidney stone before. Just is in a different location. Has had some nausea and vomiting  with it. Colicky  pain. Patient also has pain in her chest that is crampy comes and goes last less than 10 seconds at a time. Feel this is significantly atypical of coronary chest. Patient had a initial troponin that was negative as well as an EKG is unchanged. Will obtain a right upper quadrant ultrasound to rule out cholecystitis, will also obtain a full abdominal ultrasound to evaluate for renal dilation. PERC negative.   [place] with distended gallbladder without signs of acute chole.  Patient with no leukocytosis feeling much better on reassessment.  Offered outpatient surgical referral,patient elects to follow up with her GI at [facility] and see who they recommend.   I personally performed the services described in this documentation, which was scribed in my presence. The recorded information has been reviewed and is accurate.   I have discussed the diagnosis/risks/treatment options with the patient and caregiver and believe the pt to be eligible for discharge home to follow-up with GI. We also discussed returning to the ED immediately if new or worsening sx occur. We discussed the sx which are most concerning (e.g., sudden worsening pain, fever, inability to tolerate by mouth) that necessitate immediate return. Medications administered to the patient during their visit and any new prescriptions provided to the patient are listed below.  Medications given during this visit Medications  gi cocktail (Maalox,Lidocaine,Donnatal) (30 mLs Oral Given 08/06/15 2350)  morphine 4 MG/ML injection 4 mg (4 mg Intravenous Given 08/06/15 2350)  ondansetron (ZOFRAN) injection 4 mg (4 mg Intravenous Given 08/06/15 2350)  sodium chloride 0.9 %  bolus 1,000 mL (0 mLs Intravenous Stopped 08/07/15 0051)    Discharge Medication List as of 08/07/2015  3:00 AM    START taking these medications   Details  oxyCODONE (ROXICODONE) 5 MG immediate release tablet Take 0.5 tablets (2.5 mg total) by mouth every 4 (four) hours as needed for severe pain., Starting 08/07/2015, Until Discontinued, Print        The patient appears reasonably screen and/or stabilized for discharge and I doubt any other medical condition or other [facility] requiring further screening, evaluation, or treatment in the ED at this time prior to discharge.       Deno Etienne, DO 08/07/15 1756

## 2015-08-07 ENCOUNTER — Emergency Department (HOSPITAL_COMMUNITY): Payer: 59

## 2015-08-07 DIAGNOSIS — Z8669 Personal history of other diseases of the nervous system and sense organs: Secondary | ICD-10-CM | POA: Diagnosis not present

## 2015-08-07 DIAGNOSIS — I10 Essential (primary) hypertension: Secondary | ICD-10-CM | POA: Diagnosis not present

## 2015-08-07 DIAGNOSIS — R1011 Right upper quadrant pain: Secondary | ICD-10-CM | POA: Diagnosis not present

## 2015-08-07 DIAGNOSIS — Z8619 Personal history of other infectious and parasitic diseases: Secondary | ICD-10-CM | POA: Diagnosis not present

## 2015-08-07 DIAGNOSIS — E119 Type 2 diabetes mellitus without complications: Secondary | ICD-10-CM | POA: Diagnosis not present

## 2015-08-07 DIAGNOSIS — K219 Gastro-esophageal reflux disease without esophagitis: Secondary | ICD-10-CM | POA: Diagnosis not present

## 2015-08-07 DIAGNOSIS — Z79899 Other long term (current) drug therapy: Secondary | ICD-10-CM | POA: Diagnosis not present

## 2015-08-07 DIAGNOSIS — Z87442 Personal history of urinary calculi: Secondary | ICD-10-CM | POA: Diagnosis not present

## 2015-08-07 DIAGNOSIS — F17210 Nicotine dependence, cigarettes, uncomplicated: Secondary | ICD-10-CM | POA: Diagnosis not present

## 2015-08-07 MED ORDER — ONDANSETRON 4 MG PO TBDP: 4.0000 mg | ORAL_TABLET | Freq: Three times a day (TID) | ORAL | Status: DC | PRN

## 2015-08-07 MED ORDER — OXYCODONE HCL 5 MG PO TABS: 2.5000 mg | ORAL_TABLET | ORAL | Status: DC | PRN

## 2015-08-07 NOTE — ED Notes (Signed)
Patient still not back from [place].

## 2015-08-07 NOTE — ED Notes (Signed)
In [place] at this time.

## 2015-08-07 NOTE — Discharge Instructions (Signed)
Take 4 over the counter ibuprofen tablets 3 times a day or 2 over-the-counter naproxen tablets twice a day for pain.  Abdominal Pain, Adult Many things can cause abdominal pain. Usually, abdominal pain is not caused by a disease and will improve without treatment. It can often be observed and treated at home. Your health care provider will do a physical exam and possibly order blood tests and X-rays to help determine the seriousness of your pain. However, in many cases, more time must pass before a clear cause of the pain can be found. Before that point, your health care provider may not know if you need more testing or further treatment. HOME CARE INSTRUCTIONS Monitor your abdominal pain for any changes. The following actions may help to alleviate any discomfort you are experiencing:  Only take over-the-counter or prescription medicines as directed by your health care provider.  Do not take laxatives unless directed to do so by your health care provider.  Try a clear liquid diet (broth, tea, or water) as directed by your health care provider. Slowly move to a bland diet as tolerated. SEEK MEDICAL CARE IF:  You have unexplained abdominal pain.  You have abdominal pain associated with nausea or diarrhea.  You have pain when you urinate or have a bowel movement.  You experience abdominal pain that wakes you in the night.  You have abdominal pain that is worsened or improved by eating food.  You have abdominal pain that is worsened with eating fatty foods.  You have a fever. SEEK IMMEDIATE MEDICAL CARE IF:  Your pain does not go away within 2 hours.  You keep throwing up (vomiting).  Your pain is felt only in portions of the abdomen, such as the right side or the left lower portion of the abdomen.  You pass bloody or black tarry stools. MAKE SURE YOU:  Understand these instructions.  Will watch your condition.  Will get help right away if you are not doing well or get worse.   This information is not intended to replace advice given to you by your health care provider. Make sure you discuss any questions you have with your health care provider.   Document Released: 05/01/2005 Document Revised: 04/12/2015 Document Reviewed: 03/31/2013 Elsevier Interactive Patient Education [employer].

## 2015-08-09 ENCOUNTER — Encounter: Payer: Self-pay | Admitting: "Endocrinology

## 2015-08-09 ENCOUNTER — Ambulatory Visit (INDEPENDENT_AMBULATORY_CARE_PROVIDER_SITE_OTHER): Payer: 59 | Admitting: "Endocrinology

## 2015-08-09 DIAGNOSIS — F418 Other specified anxiety disorders: Secondary | ICD-10-CM | POA: Diagnosis not present

## 2015-08-09 DIAGNOSIS — E1165 Type 2 diabetes mellitus with hyperglycemia: Secondary | ICD-10-CM | POA: Diagnosis not present

## 2015-08-09 DIAGNOSIS — R1013 Epigastric pain: Secondary | ICD-10-CM

## 2015-08-09 DIAGNOSIS — R1011 Right upper quadrant pain: Secondary | ICD-10-CM

## 2015-08-09 DIAGNOSIS — E890 Postprocedural hypothyroidism: Secondary | ICD-10-CM | POA: Diagnosis not present

## 2015-08-09 DIAGNOSIS — I10 Essential (primary) hypertension: Secondary | ICD-10-CM

## 2015-08-09 DIAGNOSIS — Z794 Long term (current) use of insulin: Secondary | ICD-10-CM

## 2015-08-09 DIAGNOSIS — R600 Localized edema: Secondary | ICD-10-CM

## 2015-08-09 DIAGNOSIS — F329 Major depressive disorder, single episode, unspecified: Secondary | ICD-10-CM

## 2015-08-09 DIAGNOSIS — E282 Polycystic ovarian syndrome: Secondary | ICD-10-CM

## 2015-08-09 DIAGNOSIS — IMO0001 Reserved for inherently not codable concepts without codable children: Secondary | ICD-10-CM

## 2015-08-09 DIAGNOSIS — R5383 Other fatigue: Secondary | ICD-10-CM

## 2015-08-09 DIAGNOSIS — F419 Anxiety disorder, unspecified: Secondary | ICD-10-CM

## 2015-08-09 DIAGNOSIS — G4733 Obstructive sleep apnea (adult) (pediatric): Secondary | ICD-10-CM

## 2015-08-09 DIAGNOSIS — R1010 Upper abdominal pain, unspecified: Secondary | ICD-10-CM

## 2015-08-09 DIAGNOSIS — G8929 Other chronic pain: Secondary | ICD-10-CM

## 2015-08-09 LAB — POCT GLYCOSYLATED HEMOGLOBIN (HGB A1C): Hemoglobin A1C: 6.5

## 2015-08-09 LAB — TSH: TSH: 12.351 u[IU]/mL — ABNORMAL HIGH (ref 0.350–4.500)

## 2015-08-09 LAB — T3, FREE: T3, Free: 2.1 pg/mL — ABNORMAL LOW (ref 2.3–4.2)

## 2015-08-09 LAB — GLUCOSE, POCT (MANUAL RESULT ENTRY): POC Glucose: 142 mg/dl — AB (ref 70–99)

## 2015-08-09 LAB — T4, FREE: FREE T4: 0.9 ng/dL (ref 0.80–1.80)

## 2015-08-09 NOTE — Patient Instructions (Signed)
Follow up visit on one month.
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
## 2015-08-11 ENCOUNTER — Ambulatory Visit: Payer: Self-pay | Admitting: Adult Health

## 2015-08-15 ENCOUNTER — Ambulatory Visit: Payer: Self-pay | Admitting: Adult Health

## 2015-08-21 ENCOUNTER — Telehealth: Payer: 59 | Admitting: Family

## 2015-08-21 DIAGNOSIS — R197 Diarrhea, unspecified: Secondary | ICD-10-CM | POA: Diagnosis not present

## 2015-08-21 NOTE — Progress Notes (Signed)
We are sorry that you are not feeling well.  Here is how we plan to help!  Based on what you have shared with me it looks like you have Acute Infectious Diarrhea.  Most cases of acute diarrhea are due to infections with virus and bacteria and are self-limited conditions lasting less than 14 days.  For your symptoms you may take Imodium 2 mg tablets that are over the counter at your local pharmacy. Take two tablet now and then one after each loose stool up to 6 a day.  Antibiotics are not needed for most people with diarrhea.  Please be seen face-to-face for this if the diarrhea continues. Also, regarding the bleeding and the anemia, you really need to see someone face-to-face within the next week.   HOME CARE  We recommend changing your diet to help with your symptoms for the next few days.  Drink plenty of fluids that contain water salt and sugar. Sports drinks such as Gatorade may help.   You may try broths, soups, bananas, applesauce, soft breads, mashed potatoes or crackers.   You are considered infectious for as long as the diarrhea continues. Hand washing or use of alcohol based hand sanitizers is recommend.  It is best to stay out of work or school until your symptoms stop.   GET HELP RIGHT AWAY  If you have dark yellow colored urine or do not pass urine frequently you should drink more fluids.    If your symptoms worsen   If you feel like you are going to pass out (faint)  You have a new problem  MAKE SURE YOU   Understand these instructions.  Will watch your condition.  Will get help right away if you are not doing well or get worse.  Your e-visit answers were reviewed by a board certified advanced clinical practitioner to complete your personal care plan.  Depending on the condition, your plan could have included both over the counter or prescription medications.  If there is a problem please reply  once you have received a response from your provider.  Your  safety is important to [place].  If you have drug allergies check your prescription carefully.    You can use MyChart to ask questions about today's visit, request a non-urgent call back, or ask for a work or school excuse for 24 hours related to this e-Visit. If it has been greater than 24 hours you will need to follow up with your provider, or enter a new e-Visit to address those concerns.   You will get an e-mail in the next two days asking about your experience.  I hope that your e-visit has been valuable and will speed your recovery. Thank you for using e-visits.

## 2015-08-24 DIAGNOSIS — K5900 Constipation, unspecified: Secondary | ICD-10-CM | POA: Diagnosis not present

## 2015-08-24 DIAGNOSIS — K648 Other hemorrhoids: Secondary | ICD-10-CM | POA: Diagnosis not present

## 2015-08-24 DIAGNOSIS — I10 Essential (primary) hypertension: Secondary | ICD-10-CM | POA: Diagnosis not present

## 2015-08-29 ENCOUNTER — Ambulatory Visit: Payer: 59 | Admitting: Adult Health

## 2015-09-06 ENCOUNTER — Ambulatory Visit (INDEPENDENT_AMBULATORY_CARE_PROVIDER_SITE_OTHER): Payer: Self-pay | Admitting: Neurology

## 2015-09-06 ENCOUNTER — Ambulatory Visit (INDEPENDENT_AMBULATORY_CARE_PROVIDER_SITE_OTHER): Payer: 59 | Admitting: Neurology

## 2015-09-06 DIAGNOSIS — G5600 Carpal tunnel syndrome, unspecified upper limb: Secondary | ICD-10-CM | POA: Insufficient documentation

## 2015-09-06 DIAGNOSIS — G5602 Carpal tunnel syndrome, left upper limb: Secondary | ICD-10-CM | POA: Diagnosis not present

## 2015-09-06 DIAGNOSIS — G5601 Carpal tunnel syndrome, right upper limb: Secondary | ICD-10-CM

## 2015-09-06 DIAGNOSIS — M542 Cervicalgia: Secondary | ICD-10-CM

## 2015-09-06 DIAGNOSIS — G5603 Carpal tunnel syndrome, bilateral upper limbs: Secondary | ICD-10-CM

## 2015-09-06 DIAGNOSIS — R7303 Prediabetes: Secondary | ICD-10-CM

## 2015-09-06 DIAGNOSIS — R202 Paresthesia of skin: Secondary | ICD-10-CM

## 2015-09-06 DIAGNOSIS — Z0289 Encounter for other administrative examinations: Secondary | ICD-10-CM

## 2015-09-06 DIAGNOSIS — G500 Trigeminal neuralgia: Secondary | ICD-10-CM

## 2015-09-06 HISTORY — DX: Carpal tunnel syndrome, unspecified upper limb: G56.00

## 2015-09-06 MED ORDER — MELOXICAM 7.5 MG PO TABS: 7.5000 mg | ORAL_TABLET | Freq: Two times a day (BID) | ORAL | Status: DC

## 2015-09-06 NOTE — Progress Notes (Signed)
Electrodiagnostic study confirmed bilateral carpal tunnel syndromes, demyelinating nature, moderate to severe, right worse than left, there was no evidence of bilateral abductor pollicis brevis denervation, there was no evidence of right cervical radiculopathy  After discuss with patient, she has tried and failed Lyrica, gabapentin, I have written Mobic 7.5 as needed, refer her to Dr. Fredna Dow, hand surgeon for potential decompression

## 2015-09-06 NOTE — Procedures (Signed)
   NCS (NERVE CONDUCTION STUDY) WITH EMG (ELECTROMYOGRAPHY) REPORT   STUDY DATE: September 06 2015 PATIENT NAME: Tracy [name] DOB: 1966-02-27 MRN: [id number]    TECHNOLOGIST: Laretta Alstrom ELECTROMYOGRAPHER: Marcial Pacas M.D.  CLINICAL INFORMATION:  50 years old right-handed female, presented with bilateral hands paresthesia for few months, right worse than left, woke her up frequently at night time  FINDINGS: NERVE CONDUCTION STUDY: Bilateral ulnar sensory and motor responses were normal. Bilateral median sensory responses showed mildly prolonged peak latency, with mildly decreased snap amplitude, right worse than left.  Bilateral median motor responses showed prolonged distal latency, right side is moderate, left side is mild, with preserved snap amplitude, conduction velocity.  NEEDLE ELECTROMYOGRAPHY: Selected needle examinations were performed at bilateral abductor pollicis brevis, right cervical paraspinal muscles.  Bilateral abductor pollicis brevis: Normal insertion activity, no spontaneous activity, normal morphology motor unit potential, with mildly decreased recruitment patterns.  Needle examination of right pronator teres, biceps, triceps, extensor digitorum communis was normal  There was no spontaneous activity at right cervical paraspinal muscles, right C5-6 and 7.  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  There is no evidence of right cervical radiculopathy.  INTERPRETING PHYSICIAN:   Marcial Pacas M.D. Ph.D. [facility] [address], [place], [place] [address] [phone]

## 2015-09-07 ENCOUNTER — Telehealth: Payer: Self-pay | Admitting: Neurology

## 2015-09-07 NOTE — Telephone Encounter (Signed)
Spoke to patient and we will send her Dr. Dr. Amedeo Plenty. Patient was fine with this and she understood.

## 2015-09-07 NOTE — Telephone Encounter (Signed)
Patient is calling. She was to be referred to Dr. Fredna Dow but the patient wants to be referred to Dr. Roseanne Kaufman instead.

## 2015-09-08 DIAGNOSIS — K3189 Other diseases of stomach and duodenum: Secondary | ICD-10-CM | POA: Diagnosis not present

## 2015-09-08 DIAGNOSIS — R932 Abnormal findings on diagnostic imaging of liver and biliary tract: Secondary | ICD-10-CM | POA: Diagnosis not present

## 2015-09-08 DIAGNOSIS — K7469 Other cirrhosis of liver: Secondary | ICD-10-CM | POA: Diagnosis not present

## 2015-09-08 DIAGNOSIS — Z87891 Personal history of nicotine dependence: Secondary | ICD-10-CM | POA: Diagnosis not present

## 2015-09-08 DIAGNOSIS — F329 Major depressive disorder, single episode, unspecified: Secondary | ICD-10-CM | POA: Diagnosis not present

## 2015-09-08 DIAGNOSIS — E890 Postprocedural hypothyroidism: Secondary | ICD-10-CM | POA: Diagnosis not present

## 2015-09-08 DIAGNOSIS — K7581 Nonalcoholic steatohepatitis (NASH): Secondary | ICD-10-CM | POA: Diagnosis not present

## 2015-09-08 DIAGNOSIS — K649 Unspecified hemorrhoids: Secondary | ICD-10-CM | POA: Diagnosis not present

## 2015-09-08 DIAGNOSIS — E8881 Metabolic syndrome: Secondary | ICD-10-CM | POA: Diagnosis not present

## 2015-09-08 DIAGNOSIS — K766 Portal hypertension: Secondary | ICD-10-CM | POA: Diagnosis not present

## 2015-09-20 ENCOUNTER — Ambulatory Visit (INDEPENDENT_AMBULATORY_CARE_PROVIDER_SITE_OTHER): Payer: 59 | Admitting: "Endocrinology

## 2015-09-20 ENCOUNTER — Encounter: Payer: Self-pay | Admitting: "Endocrinology

## 2015-09-20 VITALS — BP 118/81 | HR 87 | Wt 318.0 lb

## 2015-09-20 DIAGNOSIS — E890 Postprocedural hypothyroidism: Secondary | ICD-10-CM | POA: Diagnosis not present

## 2015-09-20 DIAGNOSIS — E282 Polycystic ovarian syndrome: Secondary | ICD-10-CM

## 2015-09-20 DIAGNOSIS — L680 Hirsutism: Secondary | ICD-10-CM

## 2015-09-20 DIAGNOSIS — R5382 Chronic fatigue, unspecified: Secondary | ICD-10-CM

## 2015-09-20 DIAGNOSIS — E1165 Type 2 diabetes mellitus with hyperglycemia: Secondary | ICD-10-CM

## 2015-09-20 DIAGNOSIS — IMO0001 Reserved for inherently not codable concepts without codable children: Secondary | ICD-10-CM

## 2015-09-20 DIAGNOSIS — L83 Acanthosis nigricans: Secondary | ICD-10-CM

## 2015-09-20 DIAGNOSIS — R1013 Epigastric pain: Secondary | ICD-10-CM

## 2015-09-20 DIAGNOSIS — I10 Essential (primary) hypertension: Secondary | ICD-10-CM
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
## 2015-09-20 NOTE — Patient Instructions (Signed)
Follow up visit in one month. Please repeat lab tests in late March.

## 2015-09-21 DIAGNOSIS — N390 Urinary tract infection, site not specified: Secondary | ICD-10-CM | POA: Diagnosis not present

## 2015-09-24 DIAGNOSIS — M25561 Pain in right knee: Secondary | ICD-10-CM | POA: Diagnosis not present

## 2015-09-25 DIAGNOSIS — G4733 Obstructive sleep apnea (adult) (pediatric): Secondary | ICD-10-CM | POA: Diagnosis not present

## 2015-10-02 DIAGNOSIS — F331 Major depressive disorder, recurrent, moderate: Secondary | ICD-10-CM | POA: Diagnosis not present

## 2015-10-18 ENCOUNTER — Encounter: Payer: Self-pay | Admitting: "Endocrinology

## 2015-10-18 ENCOUNTER — Ambulatory Visit (INDEPENDENT_AMBULATORY_CARE_PROVIDER_SITE_OTHER): Payer: 59 | Admitting: "Endocrinology

## 2015-10-18 VITALS — BP 115/68 | HR 86 | Wt 317.4 lb

## 2015-10-18 DIAGNOSIS — E161 Other hypoglycemia: Secondary | ICD-10-CM

## 2015-10-18 DIAGNOSIS — E1165 Type 2 diabetes mellitus with hyperglycemia: Secondary | ICD-10-CM

## 2015-10-18 DIAGNOSIS — E669 Obesity, unspecified: Secondary | ICD-10-CM

## 2015-10-18 DIAGNOSIS — R1013 Epigastric pain: Secondary | ICD-10-CM

## 2015-10-18 DIAGNOSIS — F419 Anxiety disorder, unspecified: Secondary | ICD-10-CM

## 2015-10-18 DIAGNOSIS — IMO0001 Reserved for inherently not codable concepts without codable children: Secondary | ICD-10-CM

## 2015-10-18 DIAGNOSIS — R609 Edema, unspecified: Secondary | ICD-10-CM

## 2015-10-18 DIAGNOSIS — L680 Hirsutism: Secondary | ICD-10-CM

## 2015-10-18 DIAGNOSIS — F329 Major depressive disorder, single episode, unspecified: Secondary | ICD-10-CM

## 2015-10-18 DIAGNOSIS — E282 Polycystic ovarian syndrome: Secondary | ICD-10-CM | POA: Diagnosis not present

## 2015-10-18 DIAGNOSIS — E8881 Metabolic syndrome: Secondary | ICD-10-CM

## 2015-10-18 DIAGNOSIS — E890 Postprocedural hypothyroidism: Secondary | ICD-10-CM

## 2015-10-18 DIAGNOSIS — I10 Essential (primary) hypertension: Secondary | ICD-10-CM

## 2015-10-18 DIAGNOSIS — F418 Other specified anxiety disorders: Secondary | ICD-10-CM

## 2015-10-18 DIAGNOSIS — L83 Acanthosis nigricans: Secondary | ICD-10-CM

## 2015-10-18 DIAGNOSIS — R5383 Other fatigue: Secondary | ICD-10-CM

## 2015-10-18 LAB — GLUCOSE, POCT (MANUAL RESULT ENTRY): POC GLUCOSE: 263 mg/dL — AB (ref 70–99)

## 2015-10-18 LAB — POCT GLYCOSYLATED HEMOGLOBIN (HGB A1C): Hemoglobin A1C: 6.6

## 2015-10-18 MED ORDER — ONETOUCH VERIO VI STRP: ORAL_STRIP | Status: DC

## 2015-10-18 MED ORDER — ONETOUCH DELICA LANCETS 33G MISC: Status: AC

## 2015-10-18 MED ORDER — INSULIN PEN NEEDLE 32G X 4 MM MISC: Status: AC

## 2015-10-18 NOTE — Patient Instructions (Signed)
Follow up visit in one month. Please take two Rolaids (with simethicone) at bedtime. Start Victoza at 0.6 mg/day. Increase the dose by one click every two days until you reach a maximum dose of 1.8 mg or you develop significant reflux. If you develop reflux, reduce the dose of Victoza by 1-2 clicks. Wait one week, then try to advance the dose again. If reflux develops again, resume the highest dose that did not cause reflux.
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
## 2015-10-19 LAB — T3, FREE: T3, Free: 3.3 pg/mL (ref 2.3–4.2)

## 2015-10-19 LAB — T4, FREE: FREE T4: 1.3 ng/dL (ref 0.8–1.8)

## 2015-10-19 LAB — TSH: TSH: 0.52 m[IU]/L

## 2015-10-20 DIAGNOSIS — F331 Major depressive disorder, recurrent, moderate: Secondary | ICD-10-CM | POA: Diagnosis not present

## 2015-10-30 DIAGNOSIS — F331 Major depressive disorder, recurrent, moderate: Secondary | ICD-10-CM | POA: Diagnosis not present

## 2015-11-02 DIAGNOSIS — E119 Type 2 diabetes mellitus without complications: Secondary | ICD-10-CM | POA: Diagnosis not present

## 2015-11-02 DIAGNOSIS — I10 Essential (primary) hypertension: Secondary | ICD-10-CM | POA: Diagnosis not present

## 2015-11-02 DIAGNOSIS — F331 Major depressive disorder, recurrent, moderate: Secondary | ICD-10-CM | POA: Diagnosis not present

## 2015-11-02 DIAGNOSIS — G894 Chronic pain syndrome: Secondary | ICD-10-CM | POA: Diagnosis not present

## 2015-11-02 DIAGNOSIS — G4700 Insomnia, unspecified: Secondary | ICD-10-CM | POA: Diagnosis not present

## 2015-11-02 DIAGNOSIS — R45851 Suicidal ideations: Secondary | ICD-10-CM | POA: Diagnosis not present

## 2015-11-02 DIAGNOSIS — F603 Borderline personality disorder: Secondary | ICD-10-CM | POA: Diagnosis not present

## 2015-11-02 DIAGNOSIS — E282 Polycystic ovarian syndrome: Secondary | ICD-10-CM | POA: Diagnosis not present

## 2015-11-02 DIAGNOSIS — F3130 Bipolar disorder, current episode depressed, mild or moderate severity, unspecified: Secondary | ICD-10-CM | POA: Diagnosis not present

## 2015-11-02 DIAGNOSIS — G500 Trigeminal neuralgia: Secondary | ICD-10-CM | POA: Diagnosis not present

## 2015-11-10 DIAGNOSIS — F331 Major depressive disorder, recurrent, moderate: Secondary | ICD-10-CM | POA: Diagnosis not present

## 2015-11-10 DIAGNOSIS — F3130 Bipolar disorder, current episode depressed, mild or moderate severity, unspecified: Secondary | ICD-10-CM | POA: Diagnosis not present

## 2015-11-10 DIAGNOSIS — F603 Borderline personality disorder: Secondary | ICD-10-CM | POA: Diagnosis not present

## 2015-11-13 DIAGNOSIS — F603 Borderline personality disorder: Secondary | ICD-10-CM | POA: Diagnosis not present

## 2015-11-13 DIAGNOSIS — F3130 Bipolar disorder, current episode depressed, mild or moderate severity, unspecified: Secondary | ICD-10-CM | POA: Diagnosis not present

## 2015-11-14 DIAGNOSIS — F3130 Bipolar disorder, current episode depressed, mild or moderate severity, unspecified: Secondary | ICD-10-CM | POA: Diagnosis not present

## 2015-11-14 DIAGNOSIS — F603 Borderline personality disorder: Secondary | ICD-10-CM | POA: Diagnosis not present

## 2015-11-15 ENCOUNTER — Ambulatory Visit: Payer: Self-pay | Admitting: "Endocrinology

## 2015-11-15 DIAGNOSIS — F603 Borderline personality disorder: Secondary | ICD-10-CM | POA: Diagnosis not present

## 2015-11-15 DIAGNOSIS — F3130 Bipolar disorder, current episode depressed, mild or moderate severity, unspecified: Secondary | ICD-10-CM | POA: Diagnosis not present

## 2015-11-16 DIAGNOSIS — F3130 Bipolar disorder, current episode depressed, mild or moderate severity, unspecified: Secondary | ICD-10-CM | POA: Diagnosis not present

## 2015-11-16 DIAGNOSIS — F603 Borderline personality disorder: Secondary | ICD-10-CM | POA: Diagnosis not present

## 2015-11-17 ENCOUNTER — Ambulatory Visit (HOSPITAL_COMMUNITY)
Admission: EM | Admit: 2015-11-17 | Discharge: 2015-11-17 | Disposition: A | Discharge status: Home / Self Care | Payer: 59 | Attending: Family Medicine | Admitting: Family Medicine

## 2015-11-17 ENCOUNTER — Encounter (HOSPITAL_COMMUNITY): Payer: Self-pay | Admitting: Emergency Medicine

## 2015-11-17 DIAGNOSIS — F3130 Bipolar disorder, current episode depressed, mild or moderate severity, unspecified: Secondary | ICD-10-CM | POA: Diagnosis not present

## 2015-11-17 DIAGNOSIS — K7581 Nonalcoholic steatohepatitis (NASH): Secondary | ICD-10-CM

## 2015-11-17 DIAGNOSIS — F603 Borderline personality disorder: Secondary | ICD-10-CM | POA: Diagnosis not present

## 2015-11-17 DIAGNOSIS — K7460 Unspecified cirrhosis of liver: Secondary | ICD-10-CM

## 2015-11-17 DIAGNOSIS — R600 Localized edema: Secondary | ICD-10-CM | POA: Diagnosis not present

## 2015-11-17 LAB — POCT I-STAT, CHEM 8
BUN: 11 mg/dL (ref 6–20)
CALCIUM ION: 1.2 mmol/L (ref 1.12–1.23)
CHLORIDE: 105 mmol/L (ref 101–111)
Creatinine, Ser: 0.7 mg/dL (ref 0.44–1.00)
Glucose, Bld: 138 mg/dL — ABNORMAL HIGH (ref 65–99)
HCT: 39 % (ref 36.0–46.0)
HEMOGLOBIN: 13.3 g/dL (ref 12.0–15.0)
Potassium: 3.7 mmol/L (ref 3.5–5.1)
SODIUM: 139 mmol/L (ref 135–145)
TCO2: 23 mmol/L (ref 0–100)

## 2015-11-17 NOTE — ED Notes (Signed)
Left knee and left shoulder pain started today.  No known injury.   Bilateral lower extremity edema for 7 days.  No history of this before

## 2015-11-17 NOTE — Discharge Instructions (Signed)
Edema Recommend using compression stockings over the next few days. Keep your lower extremities elevated. Call your primary care doctor next Monday for a follow-up appointment. If, in the meantime he develop shortness of breath, chest pain, increased swelling as well as swelling in other areas go to the emergency department. Edema is an abnormal buildup of fluids in your bodytissues. Edema is somewhatdependent on gravity to pull the fluid to the lowest place in your body. That makes the condition more common in the legs and thighs (lower extremities). Painless swelling of the feet and ankles is common and becomes more likely as you get older. It is also common in looser tissues, like around your eyes.  When the affected area is squeezed, the fluid may move out of that spot and leave a dent for a few moments. This dent is called pitting.  CAUSES  There are many possible causes of edema. Eating too much salt and being on your feet or sitting for a long time can cause edema in your legs and ankles. Hot weather may make edema worse. Common medical causes of edema include:  Heart failure.  Liver disease.  Kidney disease.  Weak blood vessels in your legs.  Cancer.  An injury.  Pregnancy.  Some medications.  Obesity. SYMPTOMS  Edema is usually painless.Your skin may look swollen or shiny.  DIAGNOSIS  Your health care provider may be able to diagnose edema by asking about your medical history and doing a physical exam. You may need to have tests such as X-rays, an electrocardiogram, or blood tests to check for medical conditions that may cause edema.  TREATMENT  Edema treatment depends on the cause. If you have heart, liver, or kidney disease, you need the treatment appropriate for these conditions. General treatment may include:  Elevation of the affected body part above the level of your heart.  Compression of the affected body part. Pressure from elastic bandages or support stockings  squeezes the tissues and forces fluid back into the blood vessels. This keeps fluid from entering the tissues.  Restriction of fluid and salt intake.  Use of a water pill (diuretic). These medications are appropriate only for some types of edema. They pull fluid out of your body and make you urinate more often. This gets rid of fluid and reduces swelling, but diuretics can have side effects. Only use diuretics as directed by your health care provider. HOME CARE INSTRUCTIONS   Keep the affected body part above the level of your heart when you are lying down.   Do not sit still or stand for prolonged periods.   Do not put anything directly under your knees when lying down.  Do not wear constricting clothing or garters on your upper legs.   Exercise your legs to work the fluid back into your blood vessels. This may help the swelling go down.   Wear elastic bandages or support stockings to reduce ankle swelling as directed by your health care provider.   Eat a low-salt diet to reduce fluid if your health care provider recommends it.   Only take medicines as directed by your health care provider. SEEK MEDICAL CARE IF:   Your edema is not responding to treatment.  You have heart, liver, or kidney disease and notice symptoms of edema.  You have edema in your legs that does not improve after elevating them.   You have sudden and unexplained weight gain. SEEK IMMEDIATE MEDICAL CARE IF:   You develop shortness of breath  or chest pain.   You cannot breathe when you lie down.  You develop pain, redness, or warmth in the swollen areas.   You have heart, liver, or kidney disease and suddenly get edema.  You have a fever and your symptoms suddenly get worse. MAKE SURE YOU:   Understand these instructions.  Will watch your condition.  Will get help right away if you are not doing well or get worse.   This information is not intended to replace advice given to you by your  health care provider. Make sure you discuss any questions you have with your health care provider.   Document Released: 07/22/2005 Document Revised: 08/12/2014 Document Reviewed: 05/14/2013 Elsevier Interactive Patient Education [employer].

## 2015-11-17 NOTE — ED Provider Notes (Signed)
CSN: [id number]     Arrival date & time 11/17/15  1620 History   First MD Initiated Contact with Patient 11/17/15 1838     Chief Complaint  Patient presents with  . Leg Swelling  . Shoulder Pain   (Consider location/radiation/quality/duration/timing/severity/associated sxs/prior Treatment) HPI Comments: 50 year old morbidly obese female presents to the urgent care today with concern of swelling in the lower extremities for one week. She is also having some minor stinging and burning. This first time this is ever happened to her. Denies any known trauma. Her past medical history and current problem list is extensive and includes cirrhosis of the liver, NASH, PCOS, hypothyroidism, hypertension, depression in which she was recently hospitalized, type 2 diabetes mellitus treated with insulin, try gentle neuralgia and GERD. She currently denies chest pain, shortness of breath, orthopnea, abdominal pain, nausea or vomiting, fever, chills or other systemic symptoms.   Past Medical History  Diagnosis Date  . PCOS (polycystic ovarian syndrome)   . History of migraine headaches   . Hypothyroidism   . GERD (gastroesophageal reflux disease)   . History of kidney stones   . Arthritis     back, knees, right elbow  . Anxiety   . High cholesterol     no current med.  . Hypertension     under control with meds., has been on med. x 4 yr.  . Depression   . Achilles tendonitis 02/2013    right  . Acquired Haglund's deformity of right heel 02/2013  . 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 Insulin resistance     does not check blood sugar at home  . History of electroconvulsive therapy   . Dental crowns present   . Diabetes mellitus without complication ([place])   . PONV (postoperative nausea and vomiting)     also hx. of emergence delirium 2007  . Cirrhosis of liver ([place])   . Hepatitis A   . Trigeminal neuralgia   . Neck pain    Past Surgical History  Procedure Laterality Date  . Appendectomy     . Pilonidal cyst excision  1994  . Total thyroidectomy  2003  . Ovarian cyst surgery Left 1998  . Lumbar laminectomy      X 3  . Achilles tendon surgery Left 2007  . Excision haglund's deformity with achilles tendon repair Right 02/25/2013    Procedure: RIGHT ACHILLES DEBRIDEMENT AND RECONSTRUCTION;  HAGLUND'S EXCISION; GASTROC RECESSION AND FLEXOR HALLUCIS LONGUS TRANSFER;  Surgeon: Wylene Simmer, MD;  Location: [facility];  Service: Orthopedics;  Laterality: Right;  . Tubal ligation Right 1984  . Esophagogastroduodenoscopy (egd) with propofol N/A 04/20/2014    Procedure: ESOPHAGOGASTRODUODENOSCOPY (EGD) WITH PROPOFOL;  Surgeon: Arta Silence, MD;  Location: [facility];  Service: Endoscopy;  Laterality: N/A;  . Colonoscopy with propofol N/A 04/20/2014    Procedure: COLONOSCOPY WITH PROPOFOL;  Surgeon: Arta Silence, MD;  Location: [facility];  Service: Endoscopy;  Laterality: N/A;   Family History  Problem Relation Age of Onset  . Breast cancer Mother   . Hypertension Mother   . Heart disease Father   . Depression Father   . Cancer Brother     THROAT  . Depression Brother   . Anxiety disorder Brother   . Depression Sister    Social History  Substance Use Topics  . Smoking status: Current Some Day Smoker -- 0.50 packs/day for 11 years    Types: Cigarettes    Last Attempt to Quit: 08/05/2013  .  Smokeless tobacco: Never Used     Comment: Smokes cigarettes occasionally  . Alcohol Use: No     Comment: Has not had any alcohol since 07/2014 - before this date, she rarely drink.   OB History    Gravida Para Term Preterm AB TAB SAB Ectopic Multiple Living   0              Review of Systems  Constitutional: Negative.  Negative for fever.  HENT: Negative.   Respiratory: Negative.  Negative for cough, choking and shortness of breath.   Cardiovascular: Positive for leg swelling. Negative for chest pain and palpitations.  Gastrointestinal: Negative.    Genitourinary: Negative.   Skin: Negative.   Neurological: Negative for dizziness, syncope, facial asymmetry and speech difficulty.    Allergies  Sulfa antibiotics; Bupropion; Bupropion hcl; Codeine; and Sulfamethoxazole-trimethoprim  Home Medications   Prior to Admission medications   Medication Sig Start Date End Date Taking? Authorizing Provider  Insulin Pen Needle (INSUPEN PEN NEEDLES) 32G X 4 MM MISC BD Pen Needles- brand specific. Inject insulin via insulin pen 7 x daily 10/18/15 10/17/16  [name], MD  lisinopril-hydrochlorothiazide (PRINZIDE,ZESTORETIC) 10-12.5 MG tablet Take 1 tablet by mouth every morning. 06/06/15   Niel Hummer, NP  meloxicam (MOBIC) 7.5 MG tablet Take 1 tablet (7.5 mg total) by mouth 2 (two) times daily. Patient not taking: Reported on 10/18/2015 09/06/15   Marcial Pacas, MD  naproxen sodium (ANAPROX) 220 MG tablet Take 440 mg by mouth 2 (two) times daily as needed (pain). Reported on 10/18/2015    Historical Provider, MD  ondansetron (ZOFRAN ODT) 4 MG disintegrating tablet Take 1 tablet (4 mg total) by mouth every 8 (eight) hours as needed for nausea or vomiting. Patient not taking: Reported on 08/09/2015 08/07/15   Deno Etienne, DO  [facility] DELICA LANCETS [id number] MISC Use three times daily. 10/18/15 10/17/16  [name], MD  [facility] VERIO test strip Check blood sugar 3 x daily 10/18/15 10/17/16  [name], MD  oxyCODONE (ROXICODONE) 5 MG immediate release tablet Take 0.5 tablets (2.5 mg total) by mouth every 4 (four) hours as needed for severe pain. Patient not taking: Reported on 08/09/2015 08/07/15   Deno Etienne, DO  pregabalin (LYRICA) 100 MG capsule Take 1 capsule (100 mg total) by mouth 3 (three) times daily. Patient not taking: Reported on 08/07/2015 06/06/15   Niel Hummer, NP  ranitidine (ZANTAC) 150 MG tablet Take 150 mg by mouth 2 (two) times daily.    Historical Provider, MD  SYNTHROID 200 MCG tablet Take 1.5 of brand Synthroid, 200 mcg tablets each  morning. Patient taking differently: Take 200 mcg by mouth daily before breakfast. Take 1.5 of brand Synthroid, 200 mcg tablets each morning.-Total of 371mcg/day 06/09/15 06/08/16  [name], MD  traZODone (DESYREL) 50 MG tablet Take 1 tablet (50 mg total) by mouth at bedtime and may repeat dose one time if needed. Patient not taking: Reported on 08/07/2015 06/06/15   Kerrie Buffalo, NP  venlafaxine XR (EFFEXOR-XR) 150 MG 24 hr capsule Take 2 capsules (300 mg total) by mouth daily with breakfast. 06/06/15   Niel Hummer, NP   Meds Ordered and Administered this Visit  Medications - No data to display  BP 126/84 mmHg  Pulse 91  Temp(Src) 98.3 F (36.8 C) (Oral)  SpO2 98%  LMP 09/19/2015 No data found.   Physical Exam  Constitutional: She is oriented to person, place, and time. She appears  well-developed and well-nourished. No distress.  Eyes: Conjunctivae and EOM are normal.  Neck: Normal range of motion. Neck supple.  Cardiovascular: Normal rate, regular rhythm and normal heart sounds.   Pulmonary/Chest: Effort normal and breath sounds normal. No respiratory distress. She has no wheezes. She has no rales.  Musculoskeletal:  Lower extremity with 2+ pitting edema to include the feet. The edema starts approximately one third of the length of the lower leg below the knee. No erythema. No other discoloration, no increased warmth. No calf tenderness. Distal neurovascular and motor sensory is grossly intact.  Lymphadenopathy:    She has no cervical adenopathy.  Neurological: She is alert and oriented to person, place, and time. She exhibits normal muscle tone.  Skin: Skin is warm and dry. No rash noted. No erythema.  Psychiatric: She has a normal mood and affect.  Nursing note and vitals reviewed.   ED Course  Procedures (including critical care time)  Labs Review Labs Reviewed  POCT I-STAT, CHEM 8 - Abnormal; Notable for the following:    Glucose, Bld 138 (*)    All other  components within normal limits    Imaging Review No results found.   Visual Acuity Review  Right Eye Distance:   Left Eye Distance:   Bilateral Distance:    Right Eye Near:   Left Eye Near:    Bilateral Near:         MDM   1. Bilateral lower extremity edema   2. NASH (nonalcoholic steatohepatitis)   3. Liver cirrhosis secondary to NASH   4. Morbid obesity due to excess calories (HCC)    Recommend using compression stockings over the next few days. Keep your lower extremities elevated. Call your primary care doctor next Monday for a follow-up appointment. If, in the meantime he develop shortness of breath, chest pain, increased swelling as well as swelling in other areas go to the emergency department.     Janne Napoleon, NP 11/17/15 1931

## 2015-11-19 ENCOUNTER — Telehealth: Payer: Self-pay | Admitting: "Endocrinology

## 2015-11-19 DIAGNOSIS — IMO0002 Reserved for concepts with insufficient information to code with codable children: Secondary | ICD-10-CM

## 2015-11-19 DIAGNOSIS — E1165 Type 2 diabetes mellitus with hyperglycemia: Secondary | ICD-10-CM

## 2015-11-19 DIAGNOSIS — E118 Type 2 diabetes mellitus with unspecified complications: Principal | ICD-10-CM

## 2015-11-19 MED ORDER — VICTOZA 18 MG/3ML ~~LOC~~ SOPN
PEN_INJECTOR | SUBCUTANEOUS | Status: DC
Start: 2015-11-19 — End: 2016-12-23

## 2015-11-19 NOTE — Telephone Encounter (Signed)
1. [name] sent me a text that she is almost out of Victoza. She has gradually increased her dose to 1.2 mg/day. She has an appointment with me on 11/28/15 for follow up. 2. I texted her in return that I will put in the order for Victoza now, with a potential dose of 1.8 mg/day. [name]

## 2015-11-20 DIAGNOSIS — F603 Borderline personality disorder: Secondary | ICD-10-CM | POA: Diagnosis not present

## 2015-11-20 DIAGNOSIS — M7989 Other specified soft tissue disorders: Secondary | ICD-10-CM | POA: Diagnosis not present

## 2015-11-20 DIAGNOSIS — J309 Allergic rhinitis, unspecified: Secondary | ICD-10-CM | POA: Diagnosis not present

## 2015-11-20 DIAGNOSIS — F3130 Bipolar disorder, current episode depressed, mild or moderate severity, unspecified: Secondary | ICD-10-CM | POA: Diagnosis not present

## 2015-11-20 DIAGNOSIS — F329 Major depressive disorder, single episode, unspecified: Secondary | ICD-10-CM | POA: Diagnosis not present

## 2015-11-21 DIAGNOSIS — F603 Borderline personality disorder: Secondary | ICD-10-CM | POA: Diagnosis not present

## 2015-11-21 DIAGNOSIS — F3130 Bipolar disorder, current episode depressed, mild or moderate severity, unspecified: Secondary | ICD-10-CM | POA: Diagnosis not present

## 2015-11-22 DIAGNOSIS — F3130 Bipolar disorder, current episode depressed, mild or moderate severity, unspecified: Secondary | ICD-10-CM | POA: Diagnosis not present

## 2015-11-22 DIAGNOSIS — F603 Borderline personality disorder: Secondary | ICD-10-CM | POA: Diagnosis not present

## 2015-11-23 DIAGNOSIS — F3130 Bipolar disorder, current episode depressed, mild or moderate severity, unspecified: Secondary | ICD-10-CM | POA: Diagnosis not present

## 2015-11-23 DIAGNOSIS — F603 Borderline personality disorder: Secondary | ICD-10-CM | POA: Diagnosis not present

## 2015-11-28 ENCOUNTER — Ambulatory Visit (INDEPENDENT_AMBULATORY_CARE_PROVIDER_SITE_OTHER): Payer: 59 | Admitting: "Endocrinology

## 2015-11-28 ENCOUNTER — Encounter: Payer: Self-pay | Admitting: "Endocrinology

## 2015-11-28 ENCOUNTER — Telehealth: Payer: Self-pay | Admitting: Neurology

## 2015-11-28 VITALS — BP 120/77 | HR 100 | Wt 323.2 lb

## 2015-11-28 DIAGNOSIS — E890 Postprocedural hypothyroidism: Secondary | ICD-10-CM

## 2015-11-28 DIAGNOSIS — E11649 Type 2 diabetes mellitus with hypoglycemia without coma: Secondary | ICD-10-CM | POA: Diagnosis not present

## 2015-11-28 DIAGNOSIS — R1013 Epigastric pain: Secondary | ICD-10-CM

## 2015-11-28 DIAGNOSIS — F603 Borderline personality disorder: Secondary | ICD-10-CM | POA: Insufficient documentation

## 2015-11-28 DIAGNOSIS — E119 Type 2 diabetes mellitus without complications: Secondary | ICD-10-CM | POA: Diagnosis not present

## 2015-11-28 DIAGNOSIS — F419 Anxiety disorder, unspecified: Secondary | ICD-10-CM

## 2015-11-28 DIAGNOSIS — F3160 Bipolar disorder, current episode mixed, unspecified: Secondary | ICD-10-CM

## 2015-11-28 DIAGNOSIS — F418 Other specified anxiety disorders: Secondary | ICD-10-CM

## 2015-11-28 DIAGNOSIS — F319 Bipolar disorder, unspecified: Secondary | ICD-10-CM | POA: Insufficient documentation

## 2015-11-28 DIAGNOSIS — I10 Essential (primary) hypertension: Secondary | ICD-10-CM

## 2015-11-28 DIAGNOSIS — L83 Acanthosis nigricans: Secondary | ICD-10-CM

## 2015-11-28 DIAGNOSIS — F329 Major depressive disorder, single episode, unspecified: Secondary | ICD-10-CM

## 2015-11-28 NOTE — Patient Instructions (Signed)
Follow up visit in one month.  

## 2015-11-28 NOTE — Telephone Encounter (Signed)
Pt called and requests that her ncs/emg results be sent to Dr. Amedeo Plenty. She has a referral with them and wants to make sure those results were sent when the referral was first sent. Please call pt to update on status. [phone]
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
## 2015-11-29 DIAGNOSIS — G5603 Carpal tunnel syndrome, bilateral upper limbs: Secondary | ICD-10-CM | POA: Diagnosis not present

## 2015-11-29 DIAGNOSIS — G5602 Carpal tunnel syndrome, left upper limb: Secondary | ICD-10-CM | POA: Diagnosis not present

## 2015-11-29 DIAGNOSIS — G5601 Carpal tunnel syndrome, right upper limb: Secondary | ICD-10-CM | POA: Diagnosis not present

## 2015-11-29 NOTE — Telephone Encounter (Signed)
Noted NCV/ EMG was sent with referral. Thanks Hinton Dyer

## 2015-11-30 DIAGNOSIS — F3181 Bipolar II disorder: Secondary | ICD-10-CM | POA: Diagnosis not present

## 2015-11-30 DIAGNOSIS — F603 Borderline personality disorder: Secondary | ICD-10-CM | POA: Diagnosis not present

## 2015-12-14 DIAGNOSIS — F3181 Bipolar II disorder: Secondary | ICD-10-CM | POA: Diagnosis not present

## 2015-12-15 DIAGNOSIS — F603 Borderline personality disorder: Secondary | ICD-10-CM | POA: Diagnosis not present

## 2015-12-15 DIAGNOSIS — F331 Major depressive disorder, recurrent, moderate: Secondary | ICD-10-CM | POA: Diagnosis not present

## 2015-12-15 DIAGNOSIS — Z79899 Other long term (current) drug therapy: Secondary | ICD-10-CM | POA: Diagnosis not present

## 2015-12-15 DIAGNOSIS — F3181 Bipolar II disorder: Secondary | ICD-10-CM | POA: Diagnosis not present

## 2015-12-25 DIAGNOSIS — J069 Acute upper respiratory infection, unspecified: Secondary | ICD-10-CM | POA: Diagnosis not present

## 2015-12-25 DIAGNOSIS — R062 Wheezing: Secondary | ICD-10-CM | POA: Diagnosis not present

## 2015-12-27 DIAGNOSIS — F603 Borderline personality disorder: Secondary | ICD-10-CM | POA: Diagnosis not present

## 2015-12-27 DIAGNOSIS — F3181 Bipolar II disorder: Secondary | ICD-10-CM | POA: Diagnosis not present

## 2016-01-05 ENCOUNTER — Encounter (HOSPITAL_BASED_OUTPATIENT_CLINIC_OR_DEPARTMENT_OTHER): Payer: Self-pay | Admitting: *Deleted

## 2016-01-08 ENCOUNTER — Encounter (HOSPITAL_BASED_OUTPATIENT_CLINIC_OR_DEPARTMENT_OTHER): Payer: Self-pay | Admitting: *Deleted

## 2016-01-08 NOTE — Progress Notes (Signed)
Patient has Hx HTN, IDDM, NASH, OSA-uses CPAP nightly. She will come in for labs and anesthesia consult for BMI 45.1.

## 2016-01-09 ENCOUNTER — Encounter (HOSPITAL_BASED_OUTPATIENT_CLINIC_OR_DEPARTMENT_OTHER)
Admission: RE | Admit: 2016-01-09 | Discharge: 2016-01-09 | Disposition: A | Discharge status: Home / Self Care | Payer: 59 | Source: Ambulatory Visit | Attending: Orthopedic Surgery | Admitting: Orthopedic Surgery

## 2016-01-09 DIAGNOSIS — E039 Hypothyroidism, unspecified: Secondary | ICD-10-CM | POA: Diagnosis not present

## 2016-01-09 DIAGNOSIS — Z79899 Other long term (current) drug therapy: Secondary | ICD-10-CM | POA: Diagnosis not present

## 2016-01-09 DIAGNOSIS — F17210 Nicotine dependence, cigarettes, uncomplicated: Secondary | ICD-10-CM | POA: Diagnosis not present

## 2016-01-09 DIAGNOSIS — E119 Type 2 diabetes mellitus without complications: Secondary | ICD-10-CM | POA: Diagnosis not present

## 2016-01-09 DIAGNOSIS — G5603 Carpal tunnel syndrome, bilateral upper limbs: Secondary | ICD-10-CM | POA: Diagnosis not present

## 2016-01-09 DIAGNOSIS — K219 Gastro-esophageal reflux disease without esophagitis: Secondary | ICD-10-CM | POA: Diagnosis not present

## 2016-01-09 DIAGNOSIS — I10 Essential (primary) hypertension: Secondary | ICD-10-CM | POA: Diagnosis not present

## 2016-01-09 DIAGNOSIS — F319 Bipolar disorder, unspecified: Secondary | ICD-10-CM | POA: Diagnosis not present

## 2016-01-09 DIAGNOSIS — G4730 Sleep apnea, unspecified: Secondary | ICD-10-CM | POA: Diagnosis not present

## 2016-01-09 DIAGNOSIS — Z794 Long term (current) use of insulin: Secondary | ICD-10-CM | POA: Diagnosis not present

## 2016-01-09 DIAGNOSIS — F419 Anxiety disorder, unspecified: Secondary | ICD-10-CM | POA: Diagnosis not present

## 2016-01-09 LAB — COMPREHENSIVE METABOLIC PANEL
ALBUMIN: 3.3 g/dL — AB (ref 3.5–5.0)
ALK PHOS: 76 U/L (ref 38–126)
ALT: 47 U/L (ref 14–54)
AST: 46 U/L — AB (ref 15–41)
Anion gap: 6 (ref 5–15)
BILIRUBIN TOTAL: 0.7 mg/dL (ref 0.3–1.2)
BUN: 12 mg/dL (ref 6–20)
CO2: 25 mmol/L (ref 22–32)
CREATININE: 0.73 mg/dL (ref 0.44–1.00)
Calcium: 9.2 mg/dL (ref 8.9–10.3)
Chloride: 107 mmol/L (ref 101–111)
GFR calc Af Amer: >60 mL/min (ref >60)
GLUCOSE: 122 mg/dL — AB (ref 65–99)
POTASSIUM: 4.2 mmol/L (ref 3.5–5.1)
Sodium: 138 mmol/L (ref 135–145)
TOTAL PROTEIN: 6.5 g/dL (ref 6.5–8.1)

## 2016-01-09 NOTE — Progress Notes (Signed)
Anesthesia consult by Dr Kalman Shan, clear for surgery at surgery center

## 2016-01-10 ENCOUNTER — Other Ambulatory Visit: Payer: Self-pay | Admitting: Orthopedic Surgery

## 2016-01-12 ENCOUNTER — Ambulatory Visit (HOSPITAL_BASED_OUTPATIENT_CLINIC_OR_DEPARTMENT_OTHER): Payer: 59 | Admitting: Anesthesiology

## 2016-01-12 ENCOUNTER — Encounter (HOSPITAL_BASED_OUTPATIENT_CLINIC_OR_DEPARTMENT_OTHER): Payer: Self-pay | Admitting: Anesthesiology

## 2016-01-12 ENCOUNTER — Ambulatory Visit (HOSPITAL_BASED_OUTPATIENT_CLINIC_OR_DEPARTMENT_OTHER)
Admission: RE | Admit: 2016-01-12 | Discharge: 2016-01-12 | Disposition: A | Discharge status: Home / Self Care | Payer: 59 | Source: Ambulatory Visit | Attending: Orthopedic Surgery | Admitting: Orthopedic Surgery

## 2016-01-12 ENCOUNTER — Encounter (HOSPITAL_BASED_OUTPATIENT_CLINIC_OR_DEPARTMENT_OTHER)
Admission: RE | Disposition: A | Discharge status: Home / Self Care | Payer: Self-pay | Source: Ambulatory Visit | Attending: Orthopedic Surgery

## 2016-01-12 DIAGNOSIS — I10 Essential (primary) hypertension: Secondary | ICD-10-CM | POA: Insufficient documentation

## 2016-01-12 DIAGNOSIS — F319 Bipolar disorder, unspecified: Secondary | ICD-10-CM | POA: Diagnosis not present

## 2016-01-12 DIAGNOSIS — F17210 Nicotine dependence, cigarettes, uncomplicated: Secondary | ICD-10-CM | POA: Diagnosis not present

## 2016-01-12 DIAGNOSIS — G4730 Sleep apnea, unspecified: Secondary | ICD-10-CM | POA: Diagnosis not present

## 2016-01-12 DIAGNOSIS — E039 Hypothyroidism, unspecified: Secondary | ICD-10-CM | POA: Diagnosis not present

## 2016-01-12 DIAGNOSIS — G5603 Carpal tunnel syndrome, bilateral upper limbs: Secondary | ICD-10-CM | POA: Insufficient documentation

## 2016-01-12 DIAGNOSIS — K219 Gastro-esophageal reflux disease without esophagitis: Secondary | ICD-10-CM | POA: Diagnosis not present

## 2016-01-12 DIAGNOSIS — Z79899 Other long term (current) drug therapy: Secondary | ICD-10-CM | POA: Insufficient documentation

## 2016-01-12 DIAGNOSIS — G5601 Carpal tunnel syndrome, right upper limb: Secondary | ICD-10-CM | POA: Diagnosis not present

## 2016-01-12 DIAGNOSIS — F419 Anxiety disorder, unspecified: Secondary | ICD-10-CM | POA: Insufficient documentation

## 2016-01-12 DIAGNOSIS — E119 Type 2 diabetes mellitus without complications: Secondary | ICD-10-CM | POA: Diagnosis not present

## 2016-01-12 DIAGNOSIS — Z794 Long term (current) use of insulin: Secondary | ICD-10-CM | POA: Insufficient documentation

## 2016-01-12 DIAGNOSIS — G5602 Carpal tunnel syndrome, left upper limb: Secondary | ICD-10-CM | POA: Diagnosis not present

## 2016-01-12 HISTORY — DX: Sleep apnea, unspecified: G47.30

## 2016-01-12 HISTORY — DX: Bipolar disorder, unspecified: F31.9

## 2016-01-12 HISTORY — PX: CARPAL TUNNEL RELEASE: SHX101

## 2016-01-12 HISTORY — PX: STERIOD INJECTION: SHX5046

## 2016-01-12 HISTORY — DX: Nonalcoholic steatohepatitis (NASH): K75.81

## 2016-01-12 LAB — GLUCOSE, CAPILLARY
GLUCOSE-CAPILLARY: 143 mg/dL — AB (ref 65–99)
Glucose-Capillary: 146 mg/dL — ABNORMAL HIGH (ref 65–99)

## 2016-01-12 SURGERY — CARPAL TUNNEL RELEASE
Anesthesia: Monitor Anesthesia Care | Site: Wrist | Laterality: Right

## 2016-01-12 MED ORDER — CEFAZOLIN SODIUM-DEXTROSE 2-4 GM/100ML-% IV SOLN
INTRAVENOUS | Status: AC
  Filled 2016-01-12: qty 100

## 2016-01-12 MED ORDER — MIDAZOLAM HCL 2 MG/2ML IJ SOLN
1.0000 mg | INTRAMUSCULAR | Status: DC | PRN
  Administered 2016-01-12 (×2): 1 mg via INTRAVENOUS

## 2016-01-12 MED ORDER — BUPIVACAINE HCL (PF) 0.25 % IJ SOLN
INTRAMUSCULAR | Status: AC
  Filled 2016-01-12: qty 60

## 2016-01-12 MED ORDER — LACTATED RINGERS IV SOLN
INTRAVENOUS | Status: DC
  Administered 2016-01-12: 07:00:00 via INTRAVENOUS

## 2016-01-12 MED ORDER — ONDANSETRON HCL 4 MG/2ML IJ SOLN
INTRAMUSCULAR | Status: DC | PRN
  Administered 2016-01-12: 4 mg via INTRAVENOUS

## 2016-01-12 MED ORDER — CEFAZOLIN SODIUM 1-5 GM-% IV SOLN
INTRAVENOUS | Status: AC
  Filled 2016-01-12: qty 50

## 2016-01-12 MED ORDER — HYDROMORPHONE HCL 1 MG/ML IJ SOLN
0.2500 mg | INTRAMUSCULAR | Status: DC | PRN
Start: 2016-01-12 — End: 2016-01-12
  Administered 2016-01-12: 0.5 mg via INTRAVENOUS

## 2016-01-12 MED ORDER — BUPIVACAINE HCL (PF) 0.25 % IJ SOLN
INTRAMUSCULAR | Status: DC | PRN
  Administered 2016-01-12: 1 mL

## 2016-01-12 MED ORDER — LIDOCAINE HCL (PF) 1 % IJ SOLN
INTRAMUSCULAR | Status: AC
  Filled 2016-01-12: qty 30

## 2016-01-12 MED ORDER — BETAMETHASONE SOD PHOS & ACET 6 (3-3) MG/ML IJ SUSP
INTRAMUSCULAR | Status: AC
  Filled 2016-01-12: qty 1

## 2016-01-12 MED ORDER — CHLORHEXIDINE GLUCONATE 4 % EX LIQD: 60.0000 mL | Freq: Once | CUTANEOUS | Status: DC

## 2016-01-12 MED ORDER — HYDROMORPHONE HCL 1 MG/ML IJ SOLN
INTRAMUSCULAR | Status: AC
  Filled 2016-01-12: qty 1

## 2016-01-12 MED ORDER — SCOPOLAMINE 1 MG/3DAYS TD PT72: 1.0000 | MEDICATED_PATCH | Freq: Once | TRANSDERMAL | Status: DC | PRN

## 2016-01-12 MED ORDER — GLYCOPYRROLATE 0.2 MG/ML IJ SOLN: 0.2000 mg | Freq: Once | INTRAMUSCULAR | Status: DC | PRN

## 2016-01-12 MED ORDER — FENTANYL CITRATE (PF) 100 MCG/2ML IJ SOLN
50.0000 ug | INTRAMUSCULAR | Status: DC | PRN
  Administered 2016-01-12 (×2): 50 ug via INTRAVENOUS

## 2016-01-12 MED ORDER — OXYCODONE HCL 5 MG PO TABS: 5.0000 mg | ORAL_TABLET | ORAL | Status: DC | PRN

## 2016-01-12 MED ORDER — ONDANSETRON HCL 4 MG/2ML IJ SOLN: 4.0000 mg | Freq: Once | INTRAMUSCULAR | Status: DC | PRN

## 2016-01-12 MED ORDER — OXYCODONE HCL 5 MG PO TABS
5.0000 mg | ORAL_TABLET | Freq: Once | ORAL | Status: AC
  Administered 2016-01-12: 5 mg via ORAL

## 2016-01-12 MED ORDER — METHYLPREDNISOLONE ACETATE 80 MG/ML IJ SUSP
INTRAMUSCULAR | Status: AC
  Filled 2016-01-12: qty 1

## 2016-01-12 MED ORDER — OXYCODONE HCL 5 MG PO TABS
ORAL_TABLET | ORAL | Status: AC
  Filled 2016-01-12: qty 1

## 2016-01-12 MED ORDER — SODIUM BICARBONATE 4 % IV SOLN
INTRAVENOUS | Status: AC
  Filled 2016-01-12: qty 5

## 2016-01-12 MED ORDER — MEPERIDINE HCL 25 MG/ML IJ SOLN: 6.2500 mg | INTRAMUSCULAR | Status: DC | PRN

## 2016-01-12 MED ORDER — LIDOCAINE HCL 1 % IJ SOLN
INTRAMUSCULAR | Status: DC | PRN
Start: 2016-01-12 — End: 2016-01-12
  Administered 2016-01-12: 1 mL

## 2016-01-12 MED ORDER — SODIUM BICARBONATE 4 % IV SOLN
INTRAVENOUS | Status: DC | PRN
  Administered 2016-01-12: 20 mL via INTRAMUSCULAR

## 2016-01-12 MED ORDER — MIDAZOLAM HCL 2 MG/2ML IJ SOLN
INTRAMUSCULAR | Status: AC
  Filled 2016-01-12: qty 2

## 2016-01-12 MED ORDER — ONDANSETRON HCL 4 MG/2ML IJ SOLN
INTRAMUSCULAR | Status: AC
  Filled 2016-01-12: qty 2

## 2016-01-12 MED ORDER — FENTANYL CITRATE (PF) 100 MCG/2ML IJ SOLN
INTRAMUSCULAR | Status: AC
  Filled 2016-01-12: qty 2

## 2016-01-12 MED ORDER — PROPOFOL 500 MG/50ML IV EMUL
INTRAVENOUS | Status: AC
  Filled 2016-01-12: qty 50

## 2016-01-12 MED ORDER — DEXTROSE 5 % IV SOLN
3.0000 g | INTRAVENOUS | Status: AC
  Administered 2016-01-12: 3 g via INTRAVENOUS

## 2016-01-12 MED ORDER — BETAMETHASONE SOD PHOS & ACET 6 (3-3) MG/ML IJ SUSP
INTRAMUSCULAR | Status: DC | PRN
  Administered 2016-01-12: 1 mL via INTRA_ARTICULAR

## 2016-01-12 SURGICAL SUPPLY — 51 items
BANDAGE ACE 3X5.8 VEL STRL LF (GAUZE/BANDAGES/DRESSINGS) ×3 IMPLANT
BANDAGE ADH SHEER 1  50/CT (GAUZE/BANDAGES/DRESSINGS) IMPLANT
BLADE CARPAL TUNNEL SNGL USE (BLADE) ×3 IMPLANT
BLADE SURG 15 STRL LF DISP TIS (BLADE) ×4 IMPLANT
BLADE SURG 15 STRL SS (BLADE) ×6
BNDG CONFORM 3 STRL LF (GAUZE/BANDAGES/DRESSINGS) ×3 IMPLANT
BRUSH SCRUB EZ PLAIN DRY (MISCELLANEOUS) ×3 IMPLANT
CORDS BIPOLAR (ELECTRODE) ×3 IMPLANT
COVER BACK TABLE 60X90IN (DRAPES) ×3 IMPLANT
CUFF TOURNIQUET SINGLE 18IN (TOURNIQUET CUFF) IMPLANT
CUFF TOURNIQUET SINGLE 24IN (TOURNIQUET CUFF) ×1 IMPLANT
DRAIN PENROSE 1/4X12 LTX STRL (WOUND CARE) IMPLANT
DRAPE EXTREMITY T 121X128X90 (DRAPE) ×3 IMPLANT
DRAPE SURG 17X23 STRL (DRAPES) ×3 IMPLANT
DRSG EMULSION OIL 3X3 NADH (GAUZE/BANDAGES/DRESSINGS) ×3 IMPLANT
GAUZE SPONGE 4X4 12PLY STRL (GAUZE/BANDAGES/DRESSINGS) IMPLANT
GAUZE SPONGE 4X4 16PLY XRAY LF (GAUZE/BANDAGES/DRESSINGS) IMPLANT
GAUZE XEROFORM 1X8 LF (GAUZE/BANDAGES/DRESSINGS) ×3 IMPLANT
GLOVE BIO SURGEON STRL SZ 6.5 (GLOVE) ×1 IMPLANT
GLOVE BIOGEL M STRL SZ7.5 (GLOVE) IMPLANT
GLOVE BIOGEL PI IND STRL 7.0 (GLOVE) IMPLANT
GLOVE BIOGEL PI INDICATOR 7.0 (GLOVE) ×2
GLOVE SS BIOGEL STRL SZ 8 (GLOVE) ×2 IMPLANT
GLOVE SUPERSENSE BIOGEL SZ 8 (GLOVE) ×1
GOWN STRL REUS W/ TWL LRG LVL3 (GOWN DISPOSABLE) ×2 IMPLANT
GOWN STRL REUS W/ TWL XL LVL3 (GOWN DISPOSABLE) ×2 IMPLANT
GOWN STRL REUS W/TWL LRG LVL3 (GOWN DISPOSABLE) ×3
GOWN STRL REUS W/TWL XL LVL3 (GOWN DISPOSABLE) ×3
LOOP VESSEL MAXI BLUE (MISCELLANEOUS) IMPLANT
NDL HYPO 25X1 1.5 SAFETY (NEEDLE) ×4 IMPLANT
NDL SAFETY ECLIPSE 18X1.5 (NEEDLE) ×2 IMPLANT
NEEDLE HYPO 18GX1.5 SHARP (NEEDLE) ×6
NEEDLE HYPO 22GX1.5 SAFETY (NEEDLE) IMPLANT
NEEDLE HYPO 25X1 1.5 SAFETY (NEEDLE) ×9 IMPLANT
NS IRRIG 1000ML POUR BTL (IV SOLUTION) ×3 IMPLANT
PACK BASIN DAY SURGERY FS (CUSTOM PROCEDURE TRAY) ×3 IMPLANT
PAD ALCOHOL SWAB (MISCELLANEOUS) ×24 IMPLANT
PAD CAST 3X4 CTTN HI CHSV (CAST SUPPLIES) ×4 IMPLANT
PADDING CAST ABS 4INX4YD NS (CAST SUPPLIES) ×1
PADDING CAST ABS COTTON 4X4 ST (CAST SUPPLIES) ×2 IMPLANT
PADDING CAST COTTON 3X4 STRL (CAST SUPPLIES) ×6
SHEET MEDIUM DRAPE 40X70 STRL (DRAPES) ×3 IMPLANT
STOCKINETTE 4X48 STRL (DRAPES) ×3 IMPLANT
SUT PROLENE 4 0 PS 2 18 (SUTURE) ×3 IMPLANT
SWABSTICK POVIDONE IODINE SNGL (MISCELLANEOUS) IMPLANT
SYR BULB 3OZ (MISCELLANEOUS) ×3 IMPLANT
SYR CONTROL 10ML LL (SYRINGE) ×7 IMPLANT
TOWEL OR 17X24 6PK STRL BLUE (TOWEL DISPOSABLE) ×3 IMPLANT
TOWEL OR NON WOVEN STRL DISP B (DISPOSABLE) ×3 IMPLANT
TRAY DSU PREP LF (CUSTOM PROCEDURE TRAY) ×3 IMPLANT
UNDERPAD 30X30 (UNDERPADS AND DIAPERS) ×3 IMPLANT

## 2016-01-12 NOTE — H&P (Signed)
Tracy [name] is an 50 y.o. female.   Chief Complaint:Bilateral  R > L CTS HPI: Patient presents for evaluation and treatment of the of their upper extremity predicament. The patient denies neck, back, chest or  abdominal pain. The patient notes that they have no lower extremity problems. The patients primary complaint is noted. We are planning surgical care pathway for the upper extremity.  Past Medical History  Diagnosis Date  . PCOS (polycystic ovarian syndrome)   . History of migraine headaches   . Hypothyroidism   . GERD (gastroesophageal reflux disease)   . History of kidney stones   . Arthritis     back, knees, right elbow  . Anxiety   . High cholesterol     no current med.  . Hypertension     under control with meds., has been on med. x 4 yr.  . Depression   . Dental crowns present   . Diabetes mellitus without complication ([place])   . PONV (postoperative nausea and vomiting)     also hx. of emergence delirium 2007  . Trigeminal neuralgia   . NASH (nonalcoholic steatohepatitis)   . Bipolar disorder ([place])   . Sleep apnea     uses CPAP nightly    Past Surgical History  Procedure Laterality Date  . Appendectomy    . Pilonidal cyst excision  1994  . Total thyroidectomy  2003  . Ovarian cyst surgery Left 1998  . Lumbar laminectomy      X 3  . Achilles tendon surgery Left 2007  . Excision haglund's deformity with achilles tendon repair Right 02/25/2013    Procedure: RIGHT ACHILLES DEBRIDEMENT AND RECONSTRUCTION;  HAGLUND'S EXCISION; GASTROC RECESSION AND FLEXOR HALLUCIS LONGUS TRANSFER;  Surgeon: Wylene Simmer, MD;  Location: [place];  Service: Orthopedics;  Laterality: Right;  . Tubal ligation Right 1984  . Esophagogastroduodenoscopy (egd) with propofol N/A 04/20/2014    Procedure: ESOPHAGOGASTRODUODENOSCOPY (EGD) WITH PROPOFOL;  Surgeon: Arta Silence, MD;  Location: [facility];  Service: Endoscopy;  Laterality: N/A;  . Colonoscopy with  propofol N/A 04/20/2014    Procedure: COLONOSCOPY WITH PROPOFOL;  Surgeon: Arta Silence, MD;  Location: [facility];  Service: Endoscopy;  Laterality: N/A;  . Upper gi endoscopy  01/19/2015    Family History  Problem Relation Age of Onset  . Breast cancer Mother   . Hypertension Mother   . Heart disease Father   . Depression Father   . Cancer Brother     THROAT  . Depression Brother   . Anxiety disorder Brother   . Depression Sister    Social History:  reports that she has been smoking Cigarettes.  She has a 5.5 pack-year smoking history. She has never used smokeless tobacco. She reports that she does not drink alcohol or use illicit drugs.  Allergies:  Allergies  Allergen Reactions  . Sulfa Antibiotics Itching, Swelling and Rash  . Bupropion Rash  . Bupropion Hcl Itching and Rash  . Codeine Itching  . Sulfamethoxazole-Trimethoprim Rash    Medications Prior to Admission  Medication Sig Dispense Refill  . busPIRone (BUSPAR) 15 MG tablet Take 15 mg by mouth 3 (three) times daily.    Marland Kitchen lamoTRIgine (LAMICTAL) 150 MG tablet Take 150 mg by mouth daily.    Marland Kitchen lisinopril-hydrochlorothiazide (PRINZIDE,ZESTORETIC) 10-12.5 MG tablet Take 1 tablet by mouth every morning.    . lurasidone (LATUDA) 80 MG TABS tablet Take 80 mg by mouth daily with breakfast.    . ranitidine (ZANTAC)  150 MG tablet Take 150 mg by mouth 2 (two) times daily.    Marland Kitchen SYNTHROID 200 MCG tablet Take 1.5 of brand Synthroid, 200 mcg tablets each morning. (Patient taking differently: Take 200 mcg by mouth daily before breakfast. Take 1.5 of brand Synthroid, 200 mcg tablets each morning.-Total of 358mcg/day) 135 tablet 3  . traZODone (DESYREL) 50 MG tablet Take 1 tablet (50 mg total) by mouth at bedtime and may repeat dose one time if needed. 30 tablet 0  . VICTOZA 18 MG/3ML SOPN Inject 1.8 mg/day. 9 mL 6  . Insulin Pen Needle (INSUPEN PEN NEEDLES) 32G X 4 MM MISC BD Pen Needles- brand specific. Inject insulin via insulin  pen 7 x daily 100 each 3  . ONETOUCH DELICA LANCETS 99991111 MISC Use three times daily. 100 each 6  . ONETOUCH VERIO test strip Check blood sugar 3 x daily 100 each 6    Results for orders placed or performed during the hospital encounter of 01/12/16 (from the past 48 hour(s))  Glucose, capillary     Status: Abnormal   Collection Time: 01/12/16  7:19 AM  Result Value Ref Range   Glucose-Capillary 146 (H) 65 - 99 mg/dL   No results found.  Review of Systems  Respiratory: Negative.   Gastrointestinal: Negative.   Genitourinary: Negative.   Endo/Heme/Allergies: Negative.     Blood pressure 131/71, pulse 72, temperature 97.8 F (36.6 C), temperature source Oral, resp. rate 20, height 5\' 11"  (1.803 m), weight 146.228 kg (322 lb 6 oz), last menstrual period 11/29/2015, SpO2 98 %. Physical Exam Bilateral CTS The patient is alert and oriented in no acute distress. The patient complains of pain in the affected upper extremity.  The patient is noted to have a normal HEENT exam. Lung fields show equal chest expansion and no shortness of breath. Abdomen exam is nontender without distention. Lower extremity examination does not show any fracture dislocation or blood clot symptoms. Pelvis is stable and the neck and back are stable and nontender.  Assessment/Plan Plan for right CTR and left CT injection  We are planning surgery for your upper extremity. The risk and benefits of surgery to include risk of bleeding, infection, anesthesia,  damage to normal structures and failure of the surgery to accomplish its intended goals of relieving symptoms and restoring function have been discussed in detail. With this in mind we plan to proceed. I have specifically discussed with the patient the pre-and postoperative regime and the dos and don'ts and risk and benefits in great detail. Risk and benefits of surgery also include risk of dystrophy(CRPS), chronic nerve pain, failure of the healing process to go onto  completion and other inherent risks of surgery The relavent the pathophysiology of the disease/injury process, as well as the alternatives for treatment and postoperative course of action has been discussed in great detail with the patient who desires to proceed.  We will do everything in our power to help you (the patient) restore function to the upper extremity. It is a pleasure to see this patient today.     Paulene Floor, MD 01/12/2016, 7:35 AM

## 2016-01-12 NOTE — Discharge Instructions (Signed)
Keep bandage clean and dry.  Call for any problems.  No smoking.  Criteria for driving a car: you should be off your pain medicine for 7-8 hours, able to drive one handed(confident), thinking clearly and feeling able in your judgement to drive. °Continue elevation as it will decrease swelling.  If instructed by MD move your fingers within the confines of the bandage/splint.  Use ice if instructed by your MD. Call immediately for any sudden loss of feeling in your hand/arm or change in functional abilities of the extremity.We recommend that you to take vitamin C 1000 mg a day to promote healing. °We also recommend that if you require  pain medicine that you take a stool softener to prevent constipation as most pain medicines will have constipation side effects. We recommend either Peri-Colace or Senokot and recommend that you also consider adding MiraLAX as well to prevent the constipation affects from pain medicine if you are required to use them. These medicines are over the counter and may be purchased at a local pharmacy. A cup of yogurt and a probiotic can also be helpful during the recovery process as the medicines can disrupt your intestinal environment. ° ° ° °Post Anesthesia Home Care Instructions ° °Activity: °Get plenty of rest for the remainder of the day. A responsible adult should stay with you for 24 hours following the procedure.  °For the next 24 hours, DO NOT: °-Drive a car °-Operate machinery °-Drink alcoholic beverages °-Take any medication unless instructed by your physician °-Make any legal decisions or sign important papers. ° °Meals: °Start with liquid foods such as gelatin or soup. Progress to regular foods as tolerated. Avoid greasy, spicy, heavy foods. If nausea and/or vomiting occur, drink only clear liquids until the nausea and/or vomiting subsides. Call your physician if vomiting continues. ° °Special Instructions/Symptoms: °Your throat may feel dry or sore from the anesthesia or the  breathing tube placed in your throat during surgery. If this causes discomfort, gargle with warm salt water. The discomfort should disappear within 24 hours. ° °If you had a scopolamine patch placed behind your ear for the management of post- operative nausea and/or vomiting: ° °1. The medication in the patch is effective for 72 hours, after which it should be removed.  Wrap patch in a tissue and discard in the trash. Wash hands thoroughly with soap and water. °2. You may remove the patch earlier than 72 hours if you experience unpleasant side effects which may include dry mouth, dizziness or visual disturbances. °3. Avoid touching the patch. Wash your hands with soap and water after contact with the patch. °  ° °

## 2016-01-12 NOTE — Transfer of Care (Signed)
Immediate Anesthesia Transfer of Care Note  Patient: Tracy [name]  Procedure(s) Performed: Procedure(s): RIGHT CARPAL TUNNEL RELEASE (Right) STEROID INJECTION LEFT WRIST (Left)  Patient Location: PACU  Anesthesia Type:MAC  Level of Consciousness: awake, alert  and oriented  Airway & Oxygen Therapy: Patient Spontanous Breathing and Patient connected to face mask oxygen  Post-op Assessment: Report given to RN and Post -op Vital signs reviewed and stable  Post vital signs: Reviewed and stable  Last Vitals:  Filed Vitals:   01/12/16 0656 01/12/16 0828  BP: 131/71   Pulse: 72 72  Temp: 36.6 C   Resp: 20     Last Pain:  Filed Vitals:   01/12/16 0829  PainSc: 3       Patients Stated Pain Goal: 3 ([id number])  Complications: No apparent anesthesia complications

## 2016-01-12 NOTE — Anesthesia Preprocedure Evaluation (Addendum)
Anesthesia Evaluation  Patient identified by MRN, date of birth, ID band Patient awake    Reviewed: Allergy & Precautions, NPO status , Patient's Chart, lab work & pertinent test results  History of Anesthesia Complications (+) PONV  Airway Mallampati: I  TM Distance: >3 FB Neck ROM: Full    Dental   Pulmonary sleep apnea and Continuous Positive Airway Pressure Ventilation , Current Smoker,    Pulmonary exam normal        Cardiovascular hypertension, Pt. on medications Normal cardiovascular exam     Neuro/Psych  Headaches, Anxiety Depression Bipolar Disorder  Neuromuscular disease    GI/Hepatic   Endo/Other  diabetes  Renal/GU      Musculoskeletal   Abdominal   Peds  Hematology   Anesthesia Other Findings   Reproductive/Obstetrics                           Anesthesia Physical Anesthesia Plan  ASA: II  Anesthesia Plan: MAC   Post-op Pain Management:    Induction: Intravenous  Airway Management Planned: Simple Face Mask  Additional Equipment:   Intra-op Plan:   Post-operative Plan:   Informed Consent: I have reviewed the patients History and Physical, chart, labs and discussed the procedure including the risks, benefits and alternatives for the proposed anesthesia with the patient or authorized representative who has indicated his/her understanding and acceptance.     Plan Discussed with: CRNA and Surgeon  Anesthesia Plan Comments:         Anesthesia Quick Evaluation

## 2016-01-12 NOTE — Anesthesia Postprocedure Evaluation (Signed)
Anesthesia Post Note  Patient: Tracy [name]  Procedure(s) Performed: Procedure(s) (LRB): RIGHT CARPAL TUNNEL RELEASE (Right) STEROID INJECTION LEFT WRIST (Left)  Patient location during evaluation: PACU Anesthesia Type: MAC Level of consciousness: awake and alert Pain management: pain level controlled Vital Signs Assessment: post-procedure vital signs reviewed and stable Respiratory status: spontaneous breathing, nonlabored ventilation, respiratory function stable and patient connected to nasal cannula oxygen Cardiovascular status: stable and blood pressure returned to baseline Anesthetic complications: no    Last Vitals:  Filed Vitals:   01/12/16 0900 01/12/16 0930  BP: 128/70 120/70  Pulse: 66 66  Temp:  36.6 C  Resp: 9 16    Last Pain:  Filed Vitals:   01/12/16 0931  PainSc: 2                  Sofie Schendel DAVID

## 2016-01-12 NOTE — Op Note (Signed)
See [id number] Amedeo Plenty MD

## 2016-01-15 ENCOUNTER — Encounter (HOSPITAL_BASED_OUTPATIENT_CLINIC_OR_DEPARTMENT_OTHER): Payer: Self-pay | Admitting: Orthopedic Surgery

## 2016-01-15 ENCOUNTER — Ambulatory Visit (INDEPENDENT_AMBULATORY_CARE_PROVIDER_SITE_OTHER): Payer: 59 | Admitting: "Endocrinology

## 2016-01-15 VITALS — BP 115/68 | HR 95 | Wt 324.6 lb

## 2016-01-15 DIAGNOSIS — E063 Autoimmune thyroiditis: Secondary | ICD-10-CM

## 2016-01-15 DIAGNOSIS — R7303 Prediabetes: Secondary | ICD-10-CM

## 2016-01-15 DIAGNOSIS — E038 Other specified hypothyroidism: Secondary | ICD-10-CM

## 2016-01-15 DIAGNOSIS — R7401 Elevation of levels of liver transaminase levels: Secondary | ICD-10-CM

## 2016-01-15 DIAGNOSIS — E11649 Type 2 diabetes mellitus with hypoglycemia without coma: Secondary | ICD-10-CM

## 2016-01-15 DIAGNOSIS — E119 Type 2 diabetes mellitus without complications: Secondary | ICD-10-CM

## 2016-01-15 DIAGNOSIS — R5383 Other fatigue: Secondary | ICD-10-CM

## 2016-01-15 DIAGNOSIS — E282 Polycystic ovarian syndrome: Secondary | ICD-10-CM

## 2016-01-15 DIAGNOSIS — R600 Localized edema: Secondary | ICD-10-CM

## 2016-01-15 DIAGNOSIS — R740 Nonspecific elevation of levels of transaminase and lactic acid dehydrogenase [LDH]: Secondary | ICD-10-CM

## 2016-01-15 DIAGNOSIS — I10 Essential (primary) hypertension: Secondary | ICD-10-CM

## 2016-01-15 DIAGNOSIS — R1013 Epigastric pain: Secondary | ICD-10-CM

## 2016-01-15 LAB — POCT GLYCOSYLATED HEMOGLOBIN (HGB A1C): Hemoglobin A1C: 7.3

## 2016-01-15 LAB — GLUCOSE, POCT (MANUAL RESULT ENTRY): POC Glucose: 195 mg/dl — AB (ref 70–99)

## 2016-01-15 NOTE — Patient Instructions (Signed)
Follow up visit in two months. Please take all meds. Re-start Victoza at 1.2 mg/day. Increase Victoza by one click every 4 days until you reach the max dose of 1.8 mg/day. Call friendly Dr. Tobe Sos if having problems.
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
## 2016-01-15 NOTE — Op Note (Signed)
Tracy [name], Tracy [name]             ACCOUNT NO.:  [id number]  MEDICAL RECORD NO.:  [id number]  LOCATION:                                 FACILITY:  PHYSICIAN:  Satira Anis. Abryana Lykens, M.D.DATE OF BIRTH:  [birth year]  DATE OF PROCEDURE:  01/12/2016 DATE OF DISCHARGE:                              OPERATIVE REPORT   PREOPERATIVE DIAGNOSIS:  Bilateral right greater than left carpal tunnel syndrome.  POSTOPERATIVE DIAGNOSIS:  Bilateral right greater than left carpal tunnel syndrome.  PROCEDURE: 1. Right median nerve/peripheral nerve block at the wrist-forearm     level for anesthetic purposes for carpal tunnel release. 2. Right limited open carpal tunnel release. 3. Left carpal tunnel injection with 1 mL of Celestone and 1 mL of     lidocaine and 1 mL of Sensorcaine with epinephrine.  SURGEON:  Satira Anis. Amedeo Plenty, M.D.  ASSISTANT:  None.  COMPLICATIONS:  None.  ANESTHESIA:  Peripheral nerve block with IV sedation keeping the patient awake, alert, and oriented the entire case.  TOURNIQUET TIME:  Less than 10 minutes.  INDICATIONS:  The patient is a pleasant female who presents for admission diagnosis.  I have counseled regarding risks and benefits of surgery and she is asked to proceed.  OPERATIVE PROCEDURE:  The patient was seen by myself and Anesthesia, taken to the operative suite, underwent smooth induction of peripheral nerve/median nerve block with lidocaine and Sensorcaine with epinephrine about the right upper extremity.  She was prepped and draped before and after this course.  The injection went without difficulty.  Following this, she was secured with sterile field.  Time-out was called. Preoperative antibiotics were given and the patient then underwent an incision 1 cm in nature with 250 mm of tourniquet control applied. Following this, dissection was carried down to the palmar fascia which was incised.  We then very carefully and cautiously identified the distal edge  of the transverse carpal ligament which was released.  This was released without difficulty confirming complete distal release of the transverse carpal ligament.  I then dissected in a distal to proximal direction __________ proximal leading leaflet of the transverse carpal ligament.  The patient tolerated this well.  Once this was complete, we then performed placement of the security clip, obturator disengaged, security knife was placed and security clip effectively releasing the proximal leaflet of transverse carpal ligament.  The patient tolerated this well.  She was awake, alert and oriented during all passes and procedural measures.  She had complete release confirmed visually with 4.5 loupe magnification, and there were no complicating issues.  She was awake, alert and oriented.  We deflated the tourniquet, secured hemostasis with bipolar electrocautery and closed the wound after irrigation with Prolene.  Sterile dressing was applied.  Once this was complete, we then turned attention toward the left wrist and left wrist underwent a sterile injection without difficulty.  Sterile prep and drape with alcohol and Betadine was accomplished and injection of 1 mL of Celestone, Sensorcaine, and lidocaine were placed in the carpal/ulnar bursa region.  She tolerated this well.  There were no complicating features and no injection aftermath.  She was taken to the recovery room stable.  She will  be discharged home on Oxy IR 5 mg p.r.n. pain.  No antibiotics needed.  See me in a week. Therapy in 12 days.  Notify us should any problems occur.  It has been a pleasure to see her and treat her.  Look forward to participate in her postop aftercare.     Satira Anis. Amedeo Plenty, M.D.   ______________________________ Satira Anis. Amedeo Plenty, M.D.    [facility]  D:  01/12/2016  T:  01/12/2016  Job:  [id number]

## 2016-01-16 DIAGNOSIS — F603 Borderline personality disorder: Secondary | ICD-10-CM | POA: Diagnosis not present

## 2016-01-16 DIAGNOSIS — F3181 Bipolar II disorder: Secondary | ICD-10-CM | POA: Diagnosis not present

## 2016-01-17 ENCOUNTER — Telehealth: Payer: Self-pay | Admitting: Neurology

## 2016-01-17 DIAGNOSIS — G500 Trigeminal neuralgia: Secondary | ICD-10-CM

## 2016-01-17 NOTE — Telephone Encounter (Signed)
Patient has a follow up appointment on 01-22-16 for trigeminal neuralgia and would like a Rx called in for this until she can be seen. She is in a lot of pain. Please call to [facility] in [place] and patient would like a call back.

## 2016-01-18 DIAGNOSIS — G500 Trigeminal neuralgia: Secondary | ICD-10-CM | POA: Insufficient documentation

## 2016-01-18 DIAGNOSIS — F3181 Bipolar II disorder: Secondary | ICD-10-CM | POA: Diagnosis not present

## 2016-01-18 MED ORDER — GABAPENTIN 300 MG PO CAPS: 900.0000 mg | ORAL_CAPSULE | Freq: Three times a day (TID) | ORAL | Status: DC

## 2016-01-18 NOTE — Telephone Encounter (Signed)
Patient is calling back to follow up on the message that was sent yesterday. Please call the patient and discuss.

## 2016-01-18 NOTE — Telephone Encounter (Signed)
Spoke with patient who stated she has been having pain in the right side of her head, occasionally sharp pains x 3 days. She stated she has taken Ibuprofen which "takes the edge off, but it doesn't make the pain go away". She stated she has cirrhosis of the liver so is limited to how much Tylenol and Ibuprofen she can take. She was prescribed Oxycodone following carpal tunnel surgery on 01/12/16, but she stated "It doesn't help at all. It wasn't prescribed for that anyway. This is really getting me down"  She has follow up Monday, but stated she would like some medication to help relieve her pain until she sees Dr Krista Blue.  She stated "I don't know if she's going to put me back on Gabapentin or not, but I need something."  She requested [facility] be [facility] in [place] where she lives.  Informed her would route her request to Dr Krista Blue. She verbalized understanding, appreciation.

## 2016-01-18 NOTE — Telephone Encounter (Signed)
Have talked with patient, she had right carpal tunnel release surgery a few days ago, began to have worsening recurrent right facial pain since beginning of this week,  Previously she was taking gabapentin up to 1200 mg a day with good result, I have restarted gabapentin, she may quickly increase the dose to 300 mg 3 tablets 3 times a day, she understands the potential side effect of excessive drowsiness, sleepiness,

## 2016-01-19 DIAGNOSIS — Z4789 Encounter for other orthopedic aftercare: Secondary | ICD-10-CM | POA: Diagnosis not present

## 2016-01-22 ENCOUNTER — Encounter: Payer: Self-pay | Admitting: Neurology

## 2016-01-22 ENCOUNTER — Ambulatory Visit (INDEPENDENT_AMBULATORY_CARE_PROVIDER_SITE_OTHER): Payer: 59 | Admitting: Neurology

## 2016-01-22 VITALS — BP 120/76 | HR 83 | Ht 71.0 in | Wt 327.0 lb

## 2016-01-22 DIAGNOSIS — G43009 Migraine without aura, not intractable, without status migrainosus: Secondary | ICD-10-CM | POA: Diagnosis not present

## 2016-01-22 DIAGNOSIS — E119 Type 2 diabetes mellitus without complications: Secondary | ICD-10-CM

## 2016-01-22 DIAGNOSIS — G500 Trigeminal neuralgia: Secondary | ICD-10-CM | POA: Diagnosis not present

## 2016-01-22 MED ORDER — RIZATRIPTAN BENZOATE 10 MG PO TBDP: 10.0000 mg | ORAL_TABLET | ORAL | Status: DC | PRN

## 2016-01-22 MED ORDER — TOPIRAMATE 25 MG PO TABS: 100.0000 mg | ORAL_TABLET | Freq: Two times a day (BID) | ORAL | Status: DC

## 2016-01-22 NOTE — Progress Notes (Signed)
Chief Complaint  Patient presents with  . Trigeminal Neuralgia    She has restarted gabapentin 300mg , 3 capsules TID.  Says her facial pain has not improved.  She is also reporting a constant headache.  . Carpal Tunnel    She had surgery on her right hand with Dr. Amedeo Plenty on 01/12/16.  She had a steroid injection in her left hand for now.      PATIENT: Tracy [name] DOB: 04-23-1966  Chief Complaint  Patient presents with  . Trigeminal Neuralgia    She has restarted gabapentin 300mg , 3 capsules TID.  Says her facial pain has not improved.  She is also reporting a constant headache.  . Carpal Tunnel    She had surgery on her right hand with Dr. Amedeo Plenty on 01/12/16.  She had a steroid injection in her left hand for now.     HISTORICAL  Tracy [name] is a 50 year old right-handed female, seen in refer by her primary care physician Dr. Rachell Cipro for evaluation of right facial pain, right neck pain, radiating pain to right shoulder and arm.  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 She was under the care of Dr. Melton Alar then, she also had a frequent migraine at that time.  Since April 2016, she began to notice recurrent similar right facial pain, transient sharp radiating pain from right temporal region to her right nasal area, triggered by wind blow on her face, touching her face, present 25-50% time of the day, she has tried Ultram, Vicodin without helping, end of July to  early August, she got 2 weeks course of prednisone package without helping her pain  She also complains of chronic right-sided neck pain, radiating pain to her right shoulder, right deltoid region, getting worse over the past few months, difficulty sleeping on her right side, subjective weakness of her right hand, also complains of worsening bilateral hands paresthesia, right worse than left.  She denies bilateral feet paresthesia, no gait difficulty.  UPDATE Sep 22nd 2016: She is taking gabapentin 100mg  up to 1100mg /day, she still has significant right facial pain, radiating from right ear to her right cheek, achiness around her right eye.  If she turns her eye, she noticed sharp shooting, jolt pain to her right eye, She also complains of side effect of fatigue, clumsiness.   She also complains of worsening right hand paresthesia, getting worse while writing, radiating to right arm, right neck pain, right shoulder pain.  UPDATE January 22 2016: She had right carpal tunnel release surgery by Dr. Amedeo Plenty in January 12 2016. She came in with persistent headaches since January 15 2016, right retrorbital area, right parietal region, transient sharp stabbing pain at different spot, light noise sensitivity.  She continues to have right facial shooting pain from right cheek to her right skull, it has been going on over last month,   I have personally reviewed MRI of the brain with without contrast was normal in September 2016, I reviewed laboratory evaluation, A1c was 7.3 in June 2017, she is now treated with Victoza since early  June,   She did have a history of migraine headache in the past, not a candidate for Topamax due to history of kidney stone, she is not taking gabapentin 300 mg 3 tablets 3 times a day for her right trigeminal neuralgia, which has been helpful.  REVIEW OF SYSTEMS: Full 14 system review of systems performed and notable only for depression anxiety, headaches, muscle cramps, warm, apnea, leg  swelling, eye pain  ALLERGIES: Allergies  Allergen Reactions  . Sulfa Antibiotics Itching, Swelling and Rash  . Bupropion Rash  . Bupropion Hcl Itching and Rash  . Codeine Itching  . Sulfamethoxazole-Trimethoprim Rash    HOME MEDICATIONS: Current Outpatient Prescriptions  Medication Sig Dispense Refill  . busPIRone (BUSPAR) 15 MG tablet Take 15 mg by mouth 3 (three) times daily.    Marland Kitchen gabapentin (NEURONTIN) 300 MG capsule Take 3 capsules (900 mg total) by mouth 3 (three) times daily. 270 capsule 6  . Insulin Pen Needle (INSUPEN PEN NEEDLES) 32G X 4 MM MISC BD Pen Needles- brand specific. Inject insulin via insulin pen 7 x daily 100 each 3  . lamoTRIgine (LAMICTAL) 150 MG tablet Take 150 mg by mouth daily.    Marland Kitchen lisinopril-hydrochlorothiazide (PRINZIDE,ZESTORETIC) 10-12.5 MG tablet Take 1 tablet by mouth every morning.    . lurasidone (LATUDA) 80 MG TABS tablet Take 80 mg by mouth daily with breakfast.    . ONETOUCH DELICA LANCETS 99991111 MISC Use three times daily. 100 each 6  . ONETOUCH VERIO test strip Check blood sugar 3 x daily 100 each 6  . oxyCODONE (OXY IR/ROXICODONE) 5 MG immediate release tablet Take 1 tablet (5 mg total) by mouth every 4 (four) hours as needed for moderate pain or severe pain. 30 tablet 0  . ranitidine (ZANTAC) 150 MG tablet Take 150 mg by mouth 2 (two) times daily.    Marland Kitchen SYNTHROID 200 MCG tablet Take 1.5 of brand Synthroid, 200 mcg tablets each morning. (Patient taking differently: Take 200 mcg by mouth daily before breakfast. Take 1.5 of brand Synthroid, 200 mcg tablets each morning.-Total of 328mcg/day) 135 tablet 3  . traZODone (DESYREL) 50 MG tablet Take 1 tablet (50 mg total) by mouth at bedtime and may repeat dose one time if needed. (Patient taking differently: Take 100 mg by mouth at bedtime and may repeat dose one time if needed. ) 30 tablet 0  . VICTOZA 18 MG/3ML SOPN Inject 1.8 mg/day. 9 mL 6   No current facility-administered medications for this visit.      PAST MEDICAL HISTORY: Past Medical History  Diagnosis Date  . PCOS (polycystic ovarian syndrome)   . History of migraine headaches   . Hypothyroidism   . GERD (gastroesophageal reflux disease)   . History of kidney stones   . Arthritis     back, knees, right elbow  . Anxiety   . High cholesterol     no current med.  . Hypertension     under control with meds., has been on med. x 4 yr.  . Depression   . Achilles tendonitis 02/2013    right  . Acquired Haglund's deformity of right heel 02/2013  . 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 Insulin resistance     does not check blood sugar at home  . History of electroconvulsive therapy   . Dental crowns present   . Diabetes mellitus without complication   . PONV (postoperative nausea and vomiting)     also hx. of emergence delirium 2007  .  Cirrhosis of liver   . Trigeminal neuralgia   . Neck pain     PAST SURGICAL HISTORY: Past Surgical History  Procedure Laterality Date  . Appendectomy    . Pilonidal cyst excision  1994  . Total thyroidectomy  2003  . Ovarian cyst surgery Left 1998  . Lumbar laminectomy      X 3  . Achilles tendon surgery Left 2007  . Excision haglund's deformity with achilles tendon repair Right 02/25/2013    Procedure: RIGHT ACHILLES DEBRIDEMENT AND RECONSTRUCTION;  HAGLUND'S EXCISION; GASTROC RECESSION AND FLEXOR HALLUCIS LONGUS TRANSFER;  Surgeon: Wylene Simmer, MD;  Location: [facility];  Service: Orthopedics;  Laterality: Right;  . Esophagogastroduodenoscopy (egd) with propofol N/A 04/20/2014    Procedure: ESOPHAGOGASTRODUODENOSCOPY (EGD) WITH PROPOFOL;  Surgeon: Arta Silence, MD;  Location: [facility];  Service: Endoscopy;  Laterality: N/A;  . Colonoscopy with propofol N/A 04/20/2014    Procedure: COLONOSCOPY WITH PROPOFOL;  Surgeon: Arta Silence, MD;  Location: [facility];  Service: Endoscopy;  Laterality: N/A;    FAMILY HISTORY: Family History  Problem Relation Age of Onset   . Breast cancer Mother   . Hypertension Mother   . Heart disease Father   . Depression Father   . Cancer Brother     THROAT  . Depression Brother   . Anxiety disorder Brother   . Depression Sister     SOCIAL HISTORY:  Social History   Social History  . Marital Status: Married    Spouse Name: N/A  . Number of Children: 0  . Years of Education: 16   Occupational History  . RN- PICU    Social History Main Topics  . Smoking status: Current Some Day Smoker -- 0.50 packs/day for 11 years    Types: Cigarettes    Last Attempt to Quit: 08/05/2013  . Smokeless tobacco: Never Used     Comment: Smokes cigarettes occasionally  . Alcohol Use: No     Comment: Has not had any alcohol since 07/2014 - before this date, she rarely drink.  . Drug Use: No  . Sexual Activity: No   Other Topics Concern  . Not on file   Social History Narrative   RN - [employer]   Regular exercise: no   Caffeine use: 2 x daily   Right-handed.   Lives alone.     PHYSICAL EXAM   Filed Vitals:   01/22/16 0912  BP: 120/76  Pulse: 83  Height: 5\' 11"  (1.803 m)  Weight: 327 lb (148.326 kg)    Not recorded      Body mass index is 45.63 kg/(m^2).  PHYSICAL EXAMNIATION:  Gen: NAD, conversant, well nourised, obese, well groomed                     Cardiovascular: Regular rate rhythm, no peripheral edema, warm, nontender. Eyes: Conjunctivae clear without exudates or hemorrhage Neck: Supple, no carotid bruise. Pulmonary: Clear to auscultation bilaterally   NEUROLOGICAL EXAM:  MENTAL STATUS: Speech:    Speech is normal; fluent and spontaneous with normal comprehension.  Cognition:     Orientation to time, place and person     Normal recent and remote memory     Normal Attention span and concentration     Normal Language, naming, repeating,spontaneous speech     Fund of knowledge   CRANIAL NERVES: CN II: Visual fields are full to confrontation. Fundoscopic exam is normal with sharp  discs and no vascular changes. Pupils  are round equal and briskly reactive to light. CN III, IV, VI: extraocular movement are normal. No ptosis. CN V: Facial sensation is intact to pinprick in all 3 divisions bilaterally. Corneal responses are intact.  CN VII: Face is symmetric with normal eye closure and smile. CN VIII: Hearing is normal to rubbing fingers CN IX, X: Palate elevates symmetrically. Phonation is normal. CN XI: Head turning and shoulder shrug are intact CN XII: Tongue is midline with normal movements and no atrophy.  MOTOR: There is no pronator drift of out-stretched arms. Muscle bulk and tone are normal. Muscle strength is normal.  REFLEXES: Reflexes are 2+ and symmetric at the biceps, triceps, knees, and ankles. Plantar responses are flexor.  SENSORY: Intact to light touch, pinprick, position sense, and vibration sense are intact in fingers and toes.  COORDINATION: Rapid alternating movements and fine finger movements are intact. There is no dysmetria on finger-to-nose and heel-knee-shin.    GAIT/STANCE: Need to push up to get up from seated position, mild unsteady, due to knee pain, antalgic gait, also limited by her big body habitus  DIAGNOSTIC DATA (LABS, IMAGING, TESTING) - I reviewed patient records, labs, notes, testing and imaging myself where available.   ASSESSMENT AND PLAN  [name] Elsayed is a 50 y.o. female with past medical history of polycystic ovarian disease, obesity, depression anxiety, type 2 diabetes A1c was 6.2 to 6.6, she presented with recurrent right facial pain, involving right V2 distribution since April 2016, also complains of chronic neck pain, radiating pain to right shoulder, right arm, bilateral hands paresthesia  Right trigeminal neuralgia   MRI of the brain with and without contrast was normal in September 2016  She now back to Neurontin 300 mg 3 tablets 3 times a day, which has helped her some, but she complains of  drowsiness.  I have suggested her to taper down gabapentin once her trigeminal neuralgia is under better control  Migraine headaches:  Not a candidate for Topamax due to history of kidney stone,  Maxalt as needed  Right carpal tunnel syndrome  Status post right carpal tunnel release surgery in June 2017, recovering well  Marcial Pacas, M.D. Ph.D.  [facility] [address] [place], [place] [address] Ph: [phone] Fax: [phone]  CC: Dr. Rachell Cipro

## 2016-01-26 DIAGNOSIS — G5601 Carpal tunnel syndrome, right upper limb: Secondary | ICD-10-CM | POA: Diagnosis not present

## 2016-01-30 DIAGNOSIS — F3181 Bipolar II disorder: Secondary | ICD-10-CM | POA: Diagnosis not present

## 2016-01-30 DIAGNOSIS — F603 Borderline personality disorder: Secondary | ICD-10-CM | POA: Diagnosis not present

## 2016-02-08 DIAGNOSIS — F3181 Bipolar II disorder: Secondary | ICD-10-CM | POA: Diagnosis not present

## 2016-02-08 DIAGNOSIS — F603 Borderline personality disorder: Secondary | ICD-10-CM | POA: Diagnosis not present

## 2016-02-09 DIAGNOSIS — G5601 Carpal tunnel syndrome, right upper limb: Secondary | ICD-10-CM | POA: Diagnosis not present

## 2016-02-19 DIAGNOSIS — F3181 Bipolar II disorder: Secondary | ICD-10-CM | POA: Diagnosis not present

## 2016-02-19 DIAGNOSIS — F603 Borderline personality disorder: Secondary | ICD-10-CM | POA: Diagnosis not present

## 2016-03-14 DIAGNOSIS — F603 Borderline personality disorder: Secondary | ICD-10-CM | POA: Diagnosis not present

## 2016-03-14 DIAGNOSIS — F3181 Bipolar II disorder: Secondary | ICD-10-CM | POA: Diagnosis not present

## 2016-03-22 DIAGNOSIS — F3181 Bipolar II disorder: Secondary | ICD-10-CM | POA: Diagnosis not present

## 2016-03-28 ENCOUNTER — Ambulatory Visit (INDEPENDENT_AMBULATORY_CARE_PROVIDER_SITE_OTHER): Payer: 59 | Admitting: "Endocrinology

## 2016-03-28 VITALS — BP 114/72 | HR 80 | Wt 319.2 lb

## 2016-03-28 DIAGNOSIS — J810 Acute pulmonary edema: Secondary | ICD-10-CM

## 2016-03-28 DIAGNOSIS — L680 Hirsutism: Secondary | ICD-10-CM

## 2016-03-28 DIAGNOSIS — E1142 Type 2 diabetes mellitus with diabetic polyneuropathy: Secondary | ICD-10-CM

## 2016-03-28 DIAGNOSIS — E11649 Type 2 diabetes mellitus with hypoglycemia without coma: Secondary | ICD-10-CM | POA: Diagnosis not present

## 2016-03-28 DIAGNOSIS — IMO0001 Reserved for inherently not codable concepts without codable children: Secondary | ICD-10-CM

## 2016-03-28 DIAGNOSIS — I10 Essential (primary) hypertension: Secondary | ICD-10-CM

## 2016-03-28 DIAGNOSIS — E282 Polycystic ovarian syndrome: Secondary | ICD-10-CM

## 2016-03-28 DIAGNOSIS — L83 Acanthosis nigricans: Secondary | ICD-10-CM

## 2016-03-28 DIAGNOSIS — N915 Oligomenorrhea, unspecified: Secondary | ICD-10-CM

## 2016-03-28 DIAGNOSIS — E1165 Type 2 diabetes mellitus with hyperglycemia: Secondary | ICD-10-CM | POA: Diagnosis not present

## 2016-03-28 DIAGNOSIS — K760 Fatty (change of) liver, not elsewhere classified: Secondary | ICD-10-CM

## 2016-03-28 DIAGNOSIS — E890 Postprocedural hypothyroidism: Secondary | ICD-10-CM

## 2016-03-28 DIAGNOSIS — R5383 Other fatigue: Secondary | ICD-10-CM

## 2016-03-28 DIAGNOSIS — F3181 Bipolar II disorder: Secondary | ICD-10-CM | POA: Diagnosis not present

## 2016-03-28 DIAGNOSIS — R1013 Epigastric pain: Secondary | ICD-10-CM

## 2016-03-28 LAB — POCT GLYCOSYLATED HEMOGLOBIN (HGB A1C): Hemoglobin A1C: 6.9

## 2016-03-28 LAB — GLUCOSE, POCT (MANUAL RESULT ENTRY): POC GLUCOSE: 175 mg/dL — AB (ref 70–99)
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
## 2016-03-28 NOTE — Patient Instructions (Signed)
Follow up visit in 2 months. Pease bring in your BG meter for download. Please call if having more problems.

## 2016-03-30 ENCOUNTER — Encounter: Payer: Self-pay | Admitting: "Endocrinology

## 2016-04-03 ENCOUNTER — Telehealth: Payer: Self-pay | Admitting: Neurology

## 2016-04-03 NOTE — Telephone Encounter (Signed)
Returned call to patient - says she has been in a headache cycle for four days.  Pain is only minimally responding to naproxen and rizatriptan.  Reports having relief with trigger point injections in the past.  She has been worked into Dr. Rhea Belton schedule to discuss this treatment.

## 2016-04-03 NOTE — Telephone Encounter (Signed)
Patient reports worsening of "migraine that started Sunday August 27th, rizatriptan (MAXALT-MLT) 10 MG disintegrating tablet not helping, is there anything else we can do to help".

## 2016-04-04 ENCOUNTER — Ambulatory Visit: Payer: Self-pay | Admitting: Neurology
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
## 2016-04-09 DIAGNOSIS — F3181 Bipolar II disorder: Secondary | ICD-10-CM | POA: Diagnosis not present

## 2016-04-19 DIAGNOSIS — E8881 Metabolic syndrome: Secondary | ICD-10-CM | POA: Diagnosis not present

## 2016-04-19 DIAGNOSIS — K7460 Unspecified cirrhosis of liver: Secondary | ICD-10-CM | POA: Diagnosis not present

## 2016-04-19 DIAGNOSIS — I10 Essential (primary) hypertension: Secondary | ICD-10-CM | POA: Diagnosis not present

## 2016-04-19 DIAGNOSIS — E890 Postprocedural hypothyroidism: Secondary | ICD-10-CM | POA: Diagnosis not present

## 2016-04-19 DIAGNOSIS — R160 Hepatomegaly, not elsewhere classified: Secondary | ICD-10-CM | POA: Diagnosis not present

## 2016-04-19 DIAGNOSIS — K740 Hepatic fibrosis: Secondary | ICD-10-CM | POA: Diagnosis not present

## 2016-04-19 DIAGNOSIS — K7581 Nonalcoholic steatohepatitis (NASH): Secondary | ICD-10-CM | POA: Diagnosis not present

## 2016-04-19 DIAGNOSIS — L299 Pruritus, unspecified: Secondary | ICD-10-CM | POA: Diagnosis not present

## 2016-04-19 DIAGNOSIS — K766 Portal hypertension: Secondary | ICD-10-CM | POA: Diagnosis not present

## 2016-04-24 DIAGNOSIS — F3181 Bipolar II disorder: Secondary | ICD-10-CM | POA: Diagnosis not present

## 2016-04-25 ENCOUNTER — Ambulatory Visit: Payer: 59 | Admitting: Neurology

## 2016-04-25 DIAGNOSIS — F3181 Bipolar II disorder: Secondary | ICD-10-CM | POA: Diagnosis not present

## 2016-05-09 ENCOUNTER — Telehealth (INDEPENDENT_AMBULATORY_CARE_PROVIDER_SITE_OTHER): Payer: Self-pay | Admitting: "Endocrinology

## 2016-05-09 DIAGNOSIS — F3181 Bipolar II disorder: Secondary | ICD-10-CM | POA: Diagnosis not present

## 2016-05-09 NOTE — Telephone Encounter (Signed)
1. [name] called me today at 1:42 PM. She was in the middle of an episode of hypoglycemia. She was in the process of treating herself with oral glucose. I asked her to call back when she felt better. 2. When I did not hear from her by 2:21 PM I called her. She was feeling back to normal. Because I was in the middle of seeing patients I told her [place] I would call her when I finished ward rounds. 3. When I called her at 7:01 PM we talked. She has been having several low BGs recently, but she is not aware of the cause. I asked her to reduce her Victoza dose from 1.8 mg/day to 1.2 mg/day. I also asked her to call me on Sunday evening so that we can review her BGs on the lower Victoza dose and  adjust her Victoza dose further. [name], MD, CDE

## 2016-05-12 DIAGNOSIS — R45850 Homicidal ideations: Secondary | ICD-10-CM | POA: Diagnosis not present

## 2016-05-12 DIAGNOSIS — K760 Fatty (change of) liver, not elsewhere classified: Secondary | ICD-10-CM | POA: Diagnosis not present

## 2016-05-12 DIAGNOSIS — F3130 Bipolar disorder, current episode depressed, mild or moderate severity, unspecified: Secondary | ICD-10-CM | POA: Diagnosis not present

## 2016-05-12 DIAGNOSIS — R45851 Suicidal ideations: Secondary | ICD-10-CM | POA: Diagnosis not present

## 2016-05-12 DIAGNOSIS — F603 Borderline personality disorder: Secondary | ICD-10-CM | POA: Diagnosis not present

## 2016-05-12 DIAGNOSIS — I10 Essential (primary) hypertension: Secondary | ICD-10-CM | POA: Diagnosis not present

## 2016-05-12 DIAGNOSIS — E039 Hypothyroidism, unspecified: Secondary | ICD-10-CM | POA: Diagnosis not present

## 2016-05-12 DIAGNOSIS — M549 Dorsalgia, unspecified: Secondary | ICD-10-CM | POA: Diagnosis not present

## 2016-05-12 DIAGNOSIS — E119 Type 2 diabetes mellitus without complications: Secondary | ICD-10-CM | POA: Diagnosis not present

## 2016-05-12 DIAGNOSIS — F3181 Bipolar II disorder: Secondary | ICD-10-CM | POA: Diagnosis not present

## 2016-05-16 DIAGNOSIS — F3181 Bipolar II disorder: Secondary | ICD-10-CM | POA: Diagnosis not present

## 2016-05-20 DIAGNOSIS — F3181 Bipolar II disorder: Secondary | ICD-10-CM | POA: Diagnosis not present

## 2016-05-23 DIAGNOSIS — F3181 Bipolar II disorder: Secondary | ICD-10-CM | POA: Diagnosis not present

## 2016-06-03 ENCOUNTER — Other Ambulatory Visit (INDEPENDENT_AMBULATORY_CARE_PROVIDER_SITE_OTHER): Payer: Self-pay | Admitting: *Deleted

## 2016-06-03 DIAGNOSIS — E890 Postprocedural hypothyroidism: Secondary | ICD-10-CM

## 2016-06-03 DIAGNOSIS — M62830 Muscle spasm of back: Secondary | ICD-10-CM | POA: Diagnosis not present

## 2016-06-03 MED ORDER — SYNTHROID 200 MCG PO TABS: ORAL_TABLET | ORAL | 4 refills | Status: DC

## 2016-06-04 ENCOUNTER — Ambulatory Visit (INDEPENDENT_AMBULATORY_CARE_PROVIDER_SITE_OTHER): Payer: Self-pay | Admitting: "Endocrinology

## 2016-06-06 ENCOUNTER — Ambulatory Visit (INDEPENDENT_AMBULATORY_CARE_PROVIDER_SITE_OTHER): Payer: 59 | Admitting: "Endocrinology

## 2016-06-06 DIAGNOSIS — F3181 Bipolar II disorder: Secondary | ICD-10-CM | POA: Diagnosis not present

## 2016-06-10 ENCOUNTER — Other Ambulatory Visit (INDEPENDENT_AMBULATORY_CARE_PROVIDER_SITE_OTHER): Payer: Self-pay | Admitting: *Deleted

## 2016-06-10 ENCOUNTER — Telehealth (INDEPENDENT_AMBULATORY_CARE_PROVIDER_SITE_OTHER): Payer: Self-pay | Admitting: "Endocrinology

## 2016-06-10 DIAGNOSIS — E890 Postprocedural hypothyroidism: Secondary | ICD-10-CM

## 2016-06-10 MED ORDER — SYNTHROID 200 MCG PO TABS: ORAL_TABLET | ORAL | 4 refills | Status: DC

## 2016-06-10 NOTE — Telephone Encounter (Signed)
°  Who's calling (name and relationship to patient) : Self  Best contact number: [phone]  Provider they see: Tobe Sos  Reason for call: Needs refill for Synthroid     PRESCRIPTION REFILL ONLY  Name of prescription: Synthroid  [facility]

## 2016-06-10 NOTE — Telephone Encounter (Signed)
Script sent  

## 2016-06-14 DIAGNOSIS — Z6841 Body Mass Index (BMI) 40.0 and over, adult: Secondary | ICD-10-CM | POA: Diagnosis not present

## 2016-06-14 DIAGNOSIS — M62830 Muscle spasm of back: Secondary | ICD-10-CM | POA: Diagnosis not present

## 2016-06-14 DIAGNOSIS — K219 Gastro-esophageal reflux disease without esophagitis: Secondary | ICD-10-CM | POA: Diagnosis not present

## 2016-06-17 ENCOUNTER — Encounter (HOSPITAL_COMMUNITY): Payer: Self-pay

## 2016-06-17 ENCOUNTER — Emergency Department (HOSPITAL_COMMUNITY)
Admission: EM | Admit: 2016-06-17 | Discharge: 2016-06-17 | Disposition: A | Discharge status: Home / Self Care | Payer: 59 | Attending: Emergency Medicine | Admitting: Emergency Medicine

## 2016-06-17 DIAGNOSIS — E119 Type 2 diabetes mellitus without complications: Secondary | ICD-10-CM | POA: Diagnosis not present

## 2016-06-17 DIAGNOSIS — Z794 Long term (current) use of insulin: Secondary | ICD-10-CM | POA: Insufficient documentation

## 2016-06-17 DIAGNOSIS — M546 Pain in thoracic spine: Secondary | ICD-10-CM | POA: Diagnosis not present

## 2016-06-17 DIAGNOSIS — G8929 Other chronic pain: Secondary | ICD-10-CM | POA: Diagnosis not present

## 2016-06-17 DIAGNOSIS — Z79899 Other long term (current) drug therapy: Secondary | ICD-10-CM | POA: Insufficient documentation

## 2016-06-17 DIAGNOSIS — F17210 Nicotine dependence, cigarettes, uncomplicated: Secondary | ICD-10-CM | POA: Insufficient documentation

## 2016-06-17 DIAGNOSIS — E039 Hypothyroidism, unspecified: Secondary | ICD-10-CM | POA: Insufficient documentation

## 2016-06-17 DIAGNOSIS — I10 Essential (primary) hypertension: Secondary | ICD-10-CM | POA: Insufficient documentation

## 2016-06-17 DIAGNOSIS — M62830 Muscle spasm of back: Secondary | ICD-10-CM | POA: Diagnosis present

## 2016-06-17 MED ORDER — KETOROLAC TROMETHAMINE 60 MG/2ML IM SOLN
60.0000 mg | Freq: Once | INTRAMUSCULAR | Status: AC
  Administered 2016-06-17: 60 mg via INTRAMUSCULAR
  Filled 2016-06-17: qty 2

## 2016-06-17 MED ORDER — PREDNISONE 10 MG (21) PO TBPK: 10.0000 mg | ORAL_TABLET | Freq: Every day | ORAL | 0 refills | Status: DC

## 2016-06-17 NOTE — ED Triage Notes (Signed)
Pt states she has had a month of tx back spasms and today pain got so bad she could barely walk; pt states she has been to PCP x 2 and used hot/cold tx along with Motrin; pt states nothing has helped pain and it has increased; pt has hx of back surgery with rupture disk and believe she has ruptured another disk; pt very tearful at triage.

## 2016-06-17 NOTE — Discharge Instructions (Signed)
Take prednisone taper as instructed Continue taking tylenol/ibuprofen for pain until you are able to see your primary care doctor Follow up with your primary care doctor for re-evaluation and further work up Return to ED if you develop bladder incontinence or retention, extremity weakness, numbness

## 2016-06-17 NOTE — ED Notes (Signed)
Pt is in stable condition upon d/c and is escorted from ED via wheelchair. 

## 2016-06-17 NOTE — ED Provider Notes (Signed)
[name] DEPT Provider Note   CSN: [id number] Arrival date & time: 06/17/16  1942   By signing my name below, I, Delton Prairie, attest that this documentation has been prepared under the direction and in the presence of [employer]. Electronically Signed: Delton Prairie, ED Scribe. 06/17/16. 8:19 PM.   History   Chief Complaint Chief Complaint  Patient presents with  . Spasms    The history is provided by the patient. No language interpreter was used.   HPI Comments:  [name] is a 50 y.o. female, with a hx of degenerative disk disease and a PSHx of 3 back surgeries including one L4-L5-S1 fusion, who presents to the Emergency Department complaining of sudden onset, worsening, "10+/10" mid-lower back pain with associated spasms x 1 month. Her pain is exacerbated with any movement and ambulation. Her pain is better with rest. Pt denies any injury, trauma, heavy lifting and performing any over exerting activities. She has seen her PCP twice for this back pain and states she was prescribed 2 muscle relaxers which she has taken with no relief. She has also applied hot/cold tx and taken ibuprofen q4 hr with no relief. So far today she has taken 1600 mg of ibuprofen and 2 muscle relaxers and reports pain is 10+.  She forced herself to stay at work but could not bare the pain anymore, which is why she came to ED. Pt denies weakness/numbness to her bilateral lower and bilateral upper extremities, tingling, bladder incontinence, groin numbness, severe sudden headache, visual changes, abdominal pain, nausea, vomiting, diarrhea, constipation, fevers, cough, blurry vision, chest pain, SOB, rash.  Pt has had chickenpox and denies hx of shingles.   Past Medical History:  Diagnosis Date  . Anxiety   . Arthritis    back, knees, right elbow  . Bipolar disorder ([place])   . Dental crowns present   . Depression   . Diabetes mellitus without complication ([place])   . GERD  (gastroesophageal reflux disease)   . High cholesterol    no current med.  Marland Kitchen History of kidney stones   . History of migraine headaches   . Hypertension    under control with meds., has been on med. x 4 yr.  . Hypothyroidism   . NASH (nonalcoholic steatohepatitis)   . PCOS (polycystic ovarian syndrome)   . PONV (postoperative nausea and vomiting)    also hx. of emergence delirium 2007  . Sleep apnea    uses CPAP nightly  . Trigeminal neuralgia     Patient Active Problem List   Diagnosis Date Noted  . Trigeminal neuralgia 01/18/2016  . Bipolar disorder ([place]) 11/28/2015  . Borderline personality disorder 11/28/2015  . Carpal tunnel syndrome 09/06/2015  . MDD (major depressive disorder) 06/06/2015  . Severe episode of recurrent major depressive disorder, without psychotic features ([place])   . History of posttraumatic stress disorder (PTSD)   . GAD (generalized anxiety disorder)   . Suicidal ideation 06/05/2015  . Prediabetes 02/09/2015  . Insulin resistance 02/09/2015  . 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 Acanthosis nigricans, acquired 02/09/2015  . Nonalcoholic fatty liver disease 02/09/2015  . Hepatic cirrhosis ([place]) 02/09/2015  . Female hirsutism 02/09/2015  . Oligomenorrhea 02/09/2015  . Infertility associated with anovulation 02/09/2015  . Chest pain at rest 10/31/2014  . Morbid obesity ([place]) 10/31/2014  . Anxiety and depression 10/31/2014  . Chest pain 06/27/2014  . Obstructive sleep apnea 06/27/2014  . Diabetes mellitus, type 2 ([place]) 06/10/2013  . 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[place]) 01/01/2012  .  Cystic teratoma   . PCOS (polycystic ovarian syndrome)   . Infertility, female   . TOBACCO USE, QUIT 02/08/2010  . HYPOTHYROIDISM, POSTSURGICAL 10/02/2007  . POLYCYSTIC OVARIAN DISEASE 10/02/2007  . Elevated cholesterol 10/02/2007  . DEPRESSION 10/02/2007  . Migraine 10/02/2007  . Essential hypertension 10/02/2007  . HYPERGLYCEMIA 10/02/2007  . UTI'S, HX OF 10/02/2007  . RENAL  CALCULUS, HX OF 10/02/2007    Past Surgical History:  Procedure Laterality Date  . ACHILLES TENDON SURGERY Left 2007  . APPENDECTOMY    . CARPAL TUNNEL RELEASE Right 01/12/2016   Procedure: RIGHT CARPAL TUNNEL RELEASE;  Surgeon: Roseanne Kaufman, MD;  Location: [facility];  Service: Orthopedics;  Laterality: Right;  . COLONOSCOPY WITH PROPOFOL N/A 04/20/2014   Procedure: COLONOSCOPY WITH PROPOFOL;  Surgeon: Arta Silence, MD;  Location: [facility];  Service: Endoscopy;  Laterality: N/A;  . ESOPHAGOGASTRODUODENOSCOPY (EGD) WITH PROPOFOL N/A 04/20/2014   Procedure: ESOPHAGOGASTRODUODENOSCOPY (EGD) WITH PROPOFOL;  Surgeon: Arta Silence, MD;  Location: [facility];  Service: Endoscopy;  Laterality: N/A;  . EXCISION HAGLUND'S DEFORMITY WITH ACHILLES TENDON REPAIR Right 02/25/2013   Procedure: RIGHT ACHILLES DEBRIDEMENT AND RECONSTRUCTION;  HAGLUND'S EXCISION; GASTROC RECESSION AND FLEXOR HALLUCIS LONGUS TRANSFER;  Surgeon: Wylene Simmer, MD;  Location: [facility];  Service: Orthopedics;  Laterality: Right;  . LUMBAR LAMINECTOMY     X 3  . OVARIAN CYST SURGERY Left 1998  . PILONIDAL CYST EXCISION  1994  . STERIOD INJECTION Left 01/12/2016   Procedure: STEROID INJECTION LEFT WRIST;  Surgeon: Roseanne Kaufman, MD;  Location: [facility];  Service: Orthopedics;  Laterality: Left;  . TOTAL THYROIDECTOMY  2003  . TUBAL LIGATION Right 1984  . UPPER GI ENDOSCOPY  01/19/2015    OB History    Gravida Para Term Preterm AB Living   0             SAB TAB Ectopic Multiple Live Births                   Home Medications    Prior to Admission medications   Medication Sig Start Date End Date Taking? Authorizing Provider  busPIRone (BUSPAR) 15 MG tablet Take 15 mg by mouth 3 (three) times daily.    Historical Provider, MD  gabapentin (NEURONTIN) 300 MG capsule Take 3 capsules (900 mg total) by mouth 3 (three) times daily. Patient not taking: Reported on 03/28/2016  01/18/16   Marcial Pacas, MD  Insulin Pen Needle (INSUPEN PEN NEEDLES) 32G X 4 MM MISC BD Pen Needles- brand specific. Inject insulin via insulin pen 7 x daily 10/18/15 10/17/16  [name], MD  lamoTRIgine (LAMICTAL) 150 MG tablet Take 150 mg by mouth daily.    Historical Provider, MD  lisinopril-hydrochlorothiazide (PRINZIDE,ZESTORETIC) 10-12.5 MG tablet Take 1 tablet by mouth every morning. 06/06/15   Niel Hummer, NP  lurasidone (LATUDA) 80 MG TABS tablet Take 80 mg by mouth daily with breakfast.    Historical Provider, MD  [facility] DELICA LANCETS [id number] MISC Use three times daily. 10/18/15 10/17/16  [name], MD  [facility] VERIO test strip Check blood sugar 3 x daily 10/18/15 10/17/16  [name], MD  oxyCODONE (OXY IR/ROXICODONE) 5 MG immediate release tablet Take 1 tablet (5 mg total) by mouth every 4 (four) hours as needed for moderate pain or severe pain. Patient not taking: Reported on 03/28/2016 01/12/16   Roseanne Kaufman, MD  predniSONE (STERAPRED UNI-PAK 21 TAB) 10 MG (21) TBPK  tablet Take 1 tablet (10 mg total) by mouth daily. Take 6 tabs by mouth daily  for 2 days, then 5 tabs for 2 days, then 4 tabs for 2 days, then 3 tabs for 2 days, 2 tabs for 2 days, then 1 tab by mouth daily for 2 days 06/17/16   Kinnie Feil, PA-C  ranitidine (ZANTAC) 150 MG tablet Take 150 mg by mouth 2 (two) times daily.    Historical Provider, MD  rizatriptan (MAXALT-MLT) 10 MG disintegrating tablet Take 1 tablet (10 mg total) by mouth as needed. May repeat in 2 hours if needed Patient not taking: Reported on 03/28/2016 01/22/16   Marcial Pacas, MD  SYNTHROID 200 MCG tablet Take 1.5 of brand Synthroid, 200 mcg tablets each morning. 06/10/16 06/10/17  [name], MD  traZODone (DESYREL) 50 MG tablet Take 1 tablet (50 mg total) by mouth at bedtime and may repeat dose one time if needed. Patient taking differently: Take 100 mg by mouth at bedtime and may repeat dose one time if needed.  06/06/15   Kerrie Buffalo, NP  VICTOZA 18 MG/3ML SOPN Inject 1.8 mg/day. 11/19/15 11/18/16  [name], MD    Family History Family History  Problem Relation Age of Onset  . Heart disease Father   . Depression Father   . Cancer Brother     THROAT  . Depression Brother   . Anxiety disorder Brother   . Depression Sister   . Breast cancer Mother   . Hypertension Mother     Social History Social History  Substance Use Topics  . Smoking status: Current Some Day Smoker    Packs/day: 0.50    Years: 11.00    Types: Cigarettes    Last attempt to quit: 08/05/2013  . Smokeless tobacco: Never Used     Comment: Smokes cigarettes occasionally  . Alcohol use No     Comment: Has not had any alcohol since 07/2014 - before this date, she rarely drink.     Allergies   Sulfa antibiotics; Bupropion; Bupropion hcl; Codeine; and Sulfamethoxazole-trimethoprim   Review of Systems Review of Systems  Constitutional: Negative for fever.  HENT: Negative for congestion and sore throat.   Eyes: Negative for visual disturbance.  Respiratory: Negative for cough and shortness of breath.   Cardiovascular: Negative for chest pain.  Gastrointestinal: Negative for abdominal pain, constipation, diarrhea, nausea and vomiting.  Genitourinary:       No bladder incontinence or retention  Musculoskeletal: Positive for back pain and myalgias. Negative for gait problem, neck pain and neck stiffness.  Skin: Negative for rash.  Neurological: Negative for weakness and numbness.     Physical Exam Updated Vital Signs BP 127/72 (BP Location: Right Arm)   Pulse 79   Temp 97.9 F (36.6 C) (Oral)   Resp 18   Ht 5\' 11"  (1.803 m)   Wt (!) 145.2 kg   LMP 03/17/2016 (Approximate)   SpO2 99%   BMI 44.63 kg/m   Physical Exam  Constitutional: She is oriented to person, place, and time. She appears well-developed and well-nourished. No distress.  Pt teary eyed as she was being rolled into room on wheelchair.  HENT:  Head:  Normocephalic and atraumatic.  Right Ear: External ear normal.  Left Ear: External ear normal.  Nose: Nose normal.  Mouth/Throat: No oropharyngeal exudate.  Eyes: Conjunctivae are normal. Pupils are equal, round, and reactive to light.  Neck: Neck supple.  Cardiovascular: Normal rate, regular rhythm, normal  heart sounds and intact distal pulses.   Pulmonary/Chest: Effort normal and breath sounds normal.  Abdominal: Soft. She exhibits no distension. There is no tenderness.  Musculoskeletal:  Pt unable to ambulate due to "10/10" pain.  Full active ROM at neck, upper and lower extremities.  Shoulder abduction against resistance increased back pain.  Tenderness over T8-T10 spinous processes and paraspinal muscles over this area.    Lymphadenopathy:    She has no cervical adenopathy.  Neurological: She is alert and oriented to person, place, and time.  Sensation to light touch and strength intact in upper and lower extremities.  No groin numbness.  Skin: Skin is warm and dry.  No skin lesions, vesicles noted over T8-T10 dermatomal distribution.   Psychiatric: She has a normal mood and affect.  Teary eyed.  Nursing note and vitals reviewed.   ED Treatments / Results  DIAGNOSTIC STUDIES:  Oxygen Saturation is 100% on RA, normal by my interpretation.    COORDINATION OF CARE:  8:18 PM Discussed treatment plan with pt at bedside and pt agreed to plan.  Labs (all labs ordered are listed, but only abnormal results are displayed) Labs Reviewed - No data to display  EKG  EKG Interpretation None       Radiology No results found.  Procedures Procedures (including critical care time)  Medications Ordered in ED Medications  ketorolac (TORADOL) injection 60 mg (60 mg Intramuscular Given 06/17/16 2033)     Initial Impression / Assessment and Plan / ED Course  I have reviewed the triage vital signs and the nursing notes.  Pertinent labs & imaging results that were available  during my care of the patient were reviewed by me and considered in my medical decision making (see chart for details).  Clinical Course     50 yo obese female with multiple back surgeries and previous disc herniation presents with 10/10 back pain and spasms.  Pt has been seen by PCP twice and tried muscle relaxers, ibuprofen and other conservative tx with no relief.  Initial ddx include degenerative OA, spasms, sprain, disc herniation and less likely spinous process fx.  No neurological deficits and normal neuro exam. Full ROM in upper and lower extremities.  No loss of bowel or bladder control.  No concern for cauda equina.  No fever, night sweats, weight loss, h/o cancer, IVDA, no recent procedure to back. No urinary symptoms suggestive of UTI.  Pt will likely benefit from ortho referral and possible MRI.  Will txt with toradol x 1 in ED. Pt will be dischargd with prednisone taper and close follow up with PCP for further imaging/eval.  Supportive care and return precaution discussed. Appears safe for discharge at this time. Follow up as indicated in discharge paperwork.    Final Clinical Impressions(s) / ED Diagnoses   Final diagnoses:  Chronic bilateral thoracic back pain    New Prescriptions Discharge Medication List as of 06/17/2016  8:58 PM    START taking these medications   Details  predniSONE (STERAPRED UNI-PAK 21 TAB) 10 MG (21) TBPK tablet Take 1 tablet (10 mg total) by mouth daily. Take 6 tabs by mouth daily  for 2 days, then 5 tabs for 2 days, then 4 tabs for 2 days, then 3 tabs for 2 days, 2 tabs for 2 days, then 1 tab by mouth daily for 2 days, Starting Mon 06/17/2016, Print       I personally performed the services described in this documentation, which was scribed in my  presence. The recorded information has been reviewed and is accurate.     Kinnie Feil, PA-C 06/17/16 2121    Isla Pence, MD 06/18/16 [phone]

## 2016-06-19 ENCOUNTER — Other Ambulatory Visit: Payer: Self-pay | Admitting: Orthopedic Surgery

## 2016-06-19 DIAGNOSIS — M546 Pain in thoracic spine: Secondary | ICD-10-CM | POA: Diagnosis not present

## 2016-06-19 DIAGNOSIS — G8929 Other chronic pain: Secondary | ICD-10-CM

## 2016-06-19 DIAGNOSIS — M545 Low back pain: Secondary | ICD-10-CM | POA: Diagnosis not present

## 2016-06-29 ENCOUNTER — Other Ambulatory Visit: Payer: Self-pay

## 2016-06-30 ENCOUNTER — Ambulatory Visit
Admission: RE | Admit: 2016-06-30 | Discharge: 2016-06-30 | Disposition: A | Discharge status: Home / Self Care | Payer: 59 | Source: Ambulatory Visit | Attending: Orthopedic Surgery | Admitting: Orthopedic Surgery

## 2016-06-30 DIAGNOSIS — M546 Pain in thoracic spine: Principal | ICD-10-CM

## 2016-06-30 DIAGNOSIS — M5124 Other intervertebral disc displacement, thoracic region: Secondary | ICD-10-CM | POA: Diagnosis not present

## 2016-06-30 DIAGNOSIS — G8929 Other chronic pain: Secondary | ICD-10-CM

## 2016-07-02 ENCOUNTER — Other Ambulatory Visit: Payer: Self-pay | Admitting: Obstetrics and Gynecology

## 2016-07-02 DIAGNOSIS — Z1231 Encounter for screening mammogram for malignant neoplasm of breast: Secondary | ICD-10-CM

## 2016-07-04 ENCOUNTER — Ambulatory Visit: Payer: Self-pay

## 2016-07-04 DIAGNOSIS — F3181 Bipolar II disorder: Secondary | ICD-10-CM | POA: Diagnosis not present

## 2016-07-10 ENCOUNTER — Ambulatory Visit (INDEPENDENT_AMBULATORY_CARE_PROVIDER_SITE_OTHER): Payer: 59 | Admitting: "Endocrinology

## 2016-07-11 DIAGNOSIS — M545 Low back pain: Secondary | ICD-10-CM | POA: Diagnosis not present

## 2016-07-11 DIAGNOSIS — Z6841 Body Mass Index (BMI) 40.0 and over, adult: Secondary | ICD-10-CM | POA: Diagnosis not present

## 2016-07-11 DIAGNOSIS — I10 Essential (primary) hypertension: Secondary | ICD-10-CM | POA: Diagnosis not present

## 2016-07-18 DIAGNOSIS — M546 Pain in thoracic spine: Secondary | ICD-10-CM | POA: Diagnosis not present

## 2016-07-18 DIAGNOSIS — M47894 Other spondylosis, thoracic region: Secondary | ICD-10-CM | POA: Diagnosis not present

## 2016-07-18 DIAGNOSIS — M542 Cervicalgia: Secondary | ICD-10-CM | POA: Diagnosis not present

## 2016-09-04 DIAGNOSIS — F3181 Bipolar II disorder: Secondary | ICD-10-CM | POA: Diagnosis not present

## 2016-09-19 DIAGNOSIS — M546 Pain in thoracic spine: Secondary | ICD-10-CM | POA: Diagnosis not present

## 2016-09-19 DIAGNOSIS — Z6841 Body Mass Index (BMI) 40.0 and over, adult: Secondary | ICD-10-CM | POA: Diagnosis not present

## 2016-09-19 DIAGNOSIS — F3181 Bipolar II disorder: Secondary | ICD-10-CM | POA: Diagnosis not present

## 2016-09-19 DIAGNOSIS — M5124 Other intervertebral disc displacement, thoracic region: Secondary | ICD-10-CM | POA: Diagnosis not present

## 2016-09-19 DIAGNOSIS — M47814 Spondylosis without myelopathy or radiculopathy, thoracic region: Secondary | ICD-10-CM | POA: Diagnosis not present

## 2016-09-24 ENCOUNTER — Other Ambulatory Visit: Payer: Self-pay | Admitting: Rehabilitation

## 2016-09-24 DIAGNOSIS — M50223 Other cervical disc displacement at C6-C7 level: Secondary | ICD-10-CM

## 2016-10-02 DIAGNOSIS — F3181 Bipolar II disorder: Secondary | ICD-10-CM | POA: Diagnosis not present

## 2016-10-09 ENCOUNTER — Ambulatory Visit
Admission: RE | Admit: 2016-10-09 | Discharge: 2016-10-09 | Disposition: A | Discharge status: Home / Self Care | Payer: 59 | Source: Ambulatory Visit | Attending: Rehabilitation | Admitting: Rehabilitation

## 2016-10-09 DIAGNOSIS — M50223 Other cervical disc displacement at C6-C7 level: Secondary | ICD-10-CM

## 2016-10-09 DIAGNOSIS — M4802 Spinal stenosis, cervical region: Secondary | ICD-10-CM | POA: Diagnosis not present

## 2016-10-17 DIAGNOSIS — M5124 Other intervertebral disc displacement, thoracic region: Secondary | ICD-10-CM | POA: Diagnosis not present

## 2016-10-17 DIAGNOSIS — F3181 Bipolar II disorder: Secondary | ICD-10-CM | POA: Diagnosis not present

## 2016-10-17 DIAGNOSIS — M546 Pain in thoracic spine: Secondary | ICD-10-CM | POA: Diagnosis not present

## 2016-10-17 DIAGNOSIS — M50223 Other cervical disc displacement at C6-C7 level: Secondary | ICD-10-CM | POA: Diagnosis not present

## 2016-10-30 DIAGNOSIS — F3181 Bipolar II disorder: Secondary | ICD-10-CM | POA: Diagnosis not present

## 2016-11-08 DIAGNOSIS — M50121 Cervical disc disorder at C4-C5 level with radiculopathy: Secondary | ICD-10-CM | POA: Diagnosis not present

## 2016-11-08 DIAGNOSIS — M50222 Other cervical disc displacement at C5-C6 level: Secondary | ICD-10-CM | POA: Diagnosis not present

## 2016-11-08 DIAGNOSIS — M4712 Other spondylosis with myelopathy, cervical region: Secondary | ICD-10-CM | POA: Diagnosis not present

## 2016-11-08 DIAGNOSIS — M4802 Spinal stenosis, cervical region: Secondary | ICD-10-CM | POA: Diagnosis not present

## 2016-11-13 DIAGNOSIS — F3181 Bipolar II disorder: Secondary | ICD-10-CM | POA: Diagnosis not present

## 2016-11-27 DIAGNOSIS — Z1231 Encounter for screening mammogram for malignant neoplasm of breast: Secondary | ICD-10-CM | POA: Diagnosis not present

## 2016-11-27 DIAGNOSIS — Z124 Encounter for screening for malignant neoplasm of cervix: Secondary | ICD-10-CM | POA: Diagnosis not present

## 2016-11-27 DIAGNOSIS — F319 Bipolar disorder, unspecified: Secondary | ICD-10-CM | POA: Diagnosis not present

## 2016-11-27 DIAGNOSIS — F3181 Bipolar II disorder: Secondary | ICD-10-CM | POA: Diagnosis not present

## 2016-11-27 DIAGNOSIS — Z01411 Encounter for gynecological examination (general) (routine) with abnormal findings: Secondary | ICD-10-CM | POA: Diagnosis not present

## 2016-11-27 DIAGNOSIS — L680 Hirsutism: Secondary | ICD-10-CM | POA: Diagnosis not present

## 2016-11-27 DIAGNOSIS — N926 Irregular menstruation, unspecified: Secondary | ICD-10-CM | POA: Diagnosis not present

## 2016-11-27 DIAGNOSIS — E282 Polycystic ovarian syndrome: Secondary | ICD-10-CM | POA: Diagnosis not present

## 2016-11-27 DIAGNOSIS — Z6841 Body Mass Index (BMI) 40.0 and over, adult: Secondary | ICD-10-CM | POA: Diagnosis not present

## 2016-11-29 DIAGNOSIS — E039 Hypothyroidism, unspecified: Secondary | ICD-10-CM | POA: Diagnosis not present

## 2016-11-29 DIAGNOSIS — M50121 Cervical disc disorder at C4-C5 level with radiculopathy: Secondary | ICD-10-CM | POA: Diagnosis not present

## 2016-11-29 DIAGNOSIS — M4802 Spinal stenosis, cervical region: Secondary | ICD-10-CM | POA: Diagnosis not present

## 2016-11-29 DIAGNOSIS — Z6841 Body Mass Index (BMI) 40.0 and over, adult: Secondary | ICD-10-CM | POA: Diagnosis not present

## 2016-11-29 DIAGNOSIS — E119 Type 2 diabetes mellitus without complications: Secondary | ICD-10-CM | POA: Diagnosis not present

## 2016-11-29 DIAGNOSIS — I10 Essential (primary) hypertension: Secondary | ICD-10-CM | POA: Diagnosis not present

## 2016-11-29 DIAGNOSIS — M542 Cervicalgia: Secondary | ICD-10-CM | POA: Diagnosis not present

## 2016-11-29 DIAGNOSIS — M50222 Other cervical disc displacement at C5-C6 level: Secondary | ICD-10-CM | POA: Diagnosis not present

## 2016-11-29 DIAGNOSIS — M4712 Other spondylosis with myelopathy, cervical region: Secondary | ICD-10-CM | POA: Diagnosis not present

## 2016-12-04 DIAGNOSIS — Z01818 Encounter for other preprocedural examination: Secondary | ICD-10-CM | POA: Diagnosis not present

## 2016-12-04 DIAGNOSIS — F3181 Bipolar II disorder: Secondary | ICD-10-CM | POA: Diagnosis not present

## 2016-12-04 DIAGNOSIS — M4802 Spinal stenosis, cervical region: Secondary | ICD-10-CM | POA: Diagnosis not present

## 2016-12-06 DIAGNOSIS — F3181 Bipolar II disorder: Secondary | ICD-10-CM | POA: Diagnosis not present

## 2016-12-09 DIAGNOSIS — M50122 Cervical disc disorder at C5-C6 level with radiculopathy: Secondary | ICD-10-CM | POA: Diagnosis not present

## 2016-12-09 DIAGNOSIS — I10 Essential (primary) hypertension: Secondary | ICD-10-CM | POA: Diagnosis not present

## 2016-12-09 DIAGNOSIS — Z981 Arthrodesis status: Secondary | ICD-10-CM | POA: Diagnosis not present

## 2016-12-09 DIAGNOSIS — M50023 Cervical disc disorder at C6-C7 level with myelopathy: Secondary | ICD-10-CM | POA: Diagnosis not present

## 2016-12-09 DIAGNOSIS — M50121 Cervical disc disorder at C4-C5 level with radiculopathy: Secondary | ICD-10-CM | POA: Diagnosis not present

## 2016-12-09 DIAGNOSIS — M50223 Other cervical disc displacement at C6-C7 level: Secondary | ICD-10-CM | POA: Diagnosis not present

## 2016-12-09 DIAGNOSIS — M4712 Other spondylosis with myelopathy, cervical region: Secondary | ICD-10-CM | POA: Diagnosis not present

## 2016-12-09 DIAGNOSIS — M4802 Spinal stenosis, cervical region: Secondary | ICD-10-CM | POA: Diagnosis not present

## 2016-12-09 DIAGNOSIS — E039 Hypothyroidism, unspecified: Secondary | ICD-10-CM | POA: Diagnosis not present

## 2016-12-09 DIAGNOSIS — G43909 Migraine, unspecified, not intractable, without status migrainosus: Secondary | ICD-10-CM | POA: Diagnosis not present

## 2016-12-09 DIAGNOSIS — N200 Calculus of kidney: Secondary | ICD-10-CM | POA: Diagnosis not present

## 2016-12-09 DIAGNOSIS — M50222 Other cervical disc displacement at C5-C6 level: Secondary | ICD-10-CM | POA: Diagnosis not present

## 2016-12-09 DIAGNOSIS — E1141 Type 2 diabetes mellitus with diabetic mononeuropathy: Secondary | ICD-10-CM | POA: Diagnosis not present

## 2016-12-10 DIAGNOSIS — Z981 Arthrodesis status: Secondary | ICD-10-CM | POA: Diagnosis not present

## 2016-12-10 DIAGNOSIS — M50023 Cervical disc disorder at C6-C7 level with myelopathy: Secondary | ICD-10-CM | POA: Diagnosis not present

## 2016-12-10 DIAGNOSIS — M50122 Cervical disc disorder at C5-C6 level with radiculopathy: Secondary | ICD-10-CM | POA: Diagnosis not present

## 2016-12-10 DIAGNOSIS — G4733 Obstructive sleep apnea (adult) (pediatric): Secondary | ICD-10-CM | POA: Diagnosis not present

## 2016-12-10 DIAGNOSIS — M4322 Fusion of spine, cervical region: Secondary | ICD-10-CM | POA: Diagnosis not present

## 2016-12-10 DIAGNOSIS — M4712 Other spondylosis with myelopathy, cervical region: Secondary | ICD-10-CM | POA: Diagnosis not present

## 2016-12-10 DIAGNOSIS — I10 Essential (primary) hypertension: Secondary | ICD-10-CM | POA: Diagnosis not present

## 2016-12-10 DIAGNOSIS — N200 Calculus of kidney: Secondary | ICD-10-CM | POA: Diagnosis not present

## 2016-12-10 DIAGNOSIS — G43909 Migraine, unspecified, not intractable, without status migrainosus: Secondary | ICD-10-CM | POA: Diagnosis not present

## 2016-12-10 DIAGNOSIS — M7660 Achilles tendinitis, unspecified leg: Secondary | ICD-10-CM | POA: Diagnosis not present

## 2016-12-10 DIAGNOSIS — E1141 Type 2 diabetes mellitus with diabetic mononeuropathy: Secondary | ICD-10-CM | POA: Diagnosis not present

## 2016-12-10 DIAGNOSIS — E039 Hypothyroidism, unspecified: Secondary | ICD-10-CM | POA: Diagnosis not present

## 2016-12-17 ENCOUNTER — Telehealth (INDEPENDENT_AMBULATORY_CARE_PROVIDER_SITE_OTHER): Payer: Self-pay | Admitting: "Endocrinology

## 2016-12-17 NOTE — Telephone Encounter (Signed)
Spoke to patient, she is under the impression that she doesn't have enough synthroid in her script. I called the pharmacy and they advise the script is fine, the cost is $90. I called Maleiah back and advised her of this. She states she will decide if she can afford it or will need generic.

## 2016-12-17 NOTE — Telephone Encounter (Signed)
  Who's calling (name and relationship to patient) : Ashlon, self  Best contact number: [phone]  Provider they see: Tobe Sos  Reason for call: Ishika called in to make a follow up appt and to also speak with someone regarding her Synthroid medication.  She states that she runs out of her medication before she is allowed to refill it due to the way the prescription is written.  Please give her a call back at [phone] to discuss fixing this issue.       PRESCRIPTION REFILL ONLY  Name of prescription:  Pharmacy:

## 2016-12-23 ENCOUNTER — Other Ambulatory Visit: Payer: Self-pay | Admitting: "Endocrinology

## 2016-12-23 DIAGNOSIS — IMO0002 Reserved for concepts with insufficient information to code with codable children: Secondary | ICD-10-CM

## 2016-12-23 DIAGNOSIS — IMO0001 Reserved for inherently not codable concepts without codable children: Secondary | ICD-10-CM

## 2016-12-23 DIAGNOSIS — E1165 Type 2 diabetes mellitus with hyperglycemia: Secondary | ICD-10-CM

## 2016-12-23 DIAGNOSIS — E118 Type 2 diabetes mellitus with unspecified complications: Secondary | ICD-10-CM

## 2016-12-24 ENCOUNTER — Other Ambulatory Visit (INDEPENDENT_AMBULATORY_CARE_PROVIDER_SITE_OTHER): Payer: Self-pay | Admitting: *Deleted

## 2016-12-24 DIAGNOSIS — Z794 Long term (current) use of insulin: Principal | ICD-10-CM

## 2016-12-24 DIAGNOSIS — E1110 Type 2 diabetes mellitus with ketoacidosis without coma: Secondary | ICD-10-CM

## 2016-12-24 MED ORDER — GLUCOSE BLOOD VI STRP: ORAL_STRIP | 4 refills | Status: DC

## 2016-12-24 MED ORDER — FREESTYLE LITE DEVI: 4 refills | Status: DC

## 2016-12-25 DIAGNOSIS — F3181 Bipolar II disorder: Secondary | ICD-10-CM | POA: Diagnosis not present

## 2017-01-07 DIAGNOSIS — M50222 Other cervical disc displacement at C5-C6 level: Secondary | ICD-10-CM | POA: Diagnosis not present

## 2017-01-29 DIAGNOSIS — F3181 Bipolar II disorder: Secondary | ICD-10-CM | POA: Diagnosis not present

## 2017-01-31 ENCOUNTER — Ambulatory Visit (INDEPENDENT_AMBULATORY_CARE_PROVIDER_SITE_OTHER): Payer: 59 | Admitting: "Endocrinology

## 2017-01-31 VITALS — BP 110/64 | HR 100 | Wt 324.6 lb

## 2017-01-31 DIAGNOSIS — F3130 Bipolar disorder, current episode depressed, mild or moderate severity, unspecified: Secondary | ICD-10-CM | POA: Diagnosis not present

## 2017-01-31 DIAGNOSIS — R1013 Epigastric pain: Secondary | ICD-10-CM | POA: Diagnosis not present

## 2017-01-31 DIAGNOSIS — E161 Other hypoglycemia: Secondary | ICD-10-CM | POA: Diagnosis not present

## 2017-01-31 DIAGNOSIS — I10 Essential (primary) hypertension: Secondary | ICD-10-CM

## 2017-01-31 DIAGNOSIS — R5383 Other fatigue: Secondary | ICD-10-CM | POA: Diagnosis not present

## 2017-01-31 DIAGNOSIS — E282 Polycystic ovarian syndrome: Secondary | ICD-10-CM | POA: Diagnosis not present

## 2017-01-31 DIAGNOSIS — L680 Hirsutism: Secondary | ICD-10-CM

## 2017-01-31 DIAGNOSIS — E8881 Metabolic syndrome: Secondary | ICD-10-CM

## 2017-01-31 DIAGNOSIS — L83 Acanthosis nigricans: Secondary | ICD-10-CM

## 2017-01-31 DIAGNOSIS — E1110 Type 2 diabetes mellitus with ketoacidosis without coma: Secondary | ICD-10-CM

## 2017-01-31 DIAGNOSIS — Z794 Long term (current) use of insulin: Secondary | ICD-10-CM

## 2017-01-31 DIAGNOSIS — N914 Secondary oligomenorrhea: Secondary | ICD-10-CM

## 2017-01-31 DIAGNOSIS — E039 Hypothyroidism, unspecified: Secondary | ICD-10-CM

## 2017-01-31 LAB — POCT GLUCOSE (DEVICE FOR HOME USE): POC Glucose: 226 mg/dl — AB (ref 70–99)

## 2017-01-31 LAB — POCT GLYCOSYLATED HEMOGLOBIN (HGB A1C): Hemoglobin A1C: 8.4

## 2017-01-31 MED ORDER — LANSOPRAZOLE 30 MG PO CPDR
30.0000 mg | DELAYED_RELEASE_CAPSULE | Freq: Every day | ORAL | 6 refills | Status: DC
Start: 2017-01-31 — End: 2017-04-02

## 2017-01-31 NOTE — Progress Notes (Signed)
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 hypothyroidism was probably also a factor in her fatigue. It also appears that her depression has contributed to her fatigue.  15. Edema: This problem is variable, but is not evident today.   16. Tachycardia: I suspect that she has a recurrence of autonomic neuropathy and inappropriate sinus tachycardia, but the tachycardia today may just been due to her being upset.   PLAN:  1. Diagnostic: HbA1c today. TFTs, CMP, urine microalbumin/creatinine ratio, C-peptide. Call Dr. Tobe Sos in two weeks on a Wednesday or Sunday evening to discuss BGs.  2. Therapeutic: Take current medications, to include Victoza. Increase the Victoza dose by one click every 3 days until you reach a level of 1.8 mg or you have severe GI symptoms, at which point you need to reduce the dose back to where you were asymptomatic. Start lansoprazole, 30 mg/day. Eat Right Diet. Exercise daily, with a goal of gradually increasing her duration of exercise to at least one hour per day. Consider adding spironolactone. Do a daily schedule. 3. Patient education: We discussed all of the above at great length. We discussed her post-surgical hypothyroidism, T2DM, hypertension, and dyspepsia, to include the need for her to take her medications  for these problems consistently and reliably. I again cautioned her about having unprotected intercourse. I think that it would be unwise to refer her for bariatric surgery at this time. She is still too emotionally fragile.   4. Follow-up: 2 months  Level of Service: This visit lasted in excess of 50 minutes. More than 50% of the visit was devoted to counseling.  Tillman Sers, MD, CDE Adult and Pediatric Endocrinology

## 2017-01-31 NOTE — Patient Instructions (Addendum)
Follow up visit in two months. Check Bgs twice daily. Increase Victoza by one click every 3 days until you reach a max dose of 1.8 mg/day or you develop nausea or abdominal pain, then back off to the last dose that did not cause GI symptoms. Call Dr. Tobe Sos in two weeks on a Wednesday or Sunday evening to discuss BGs. WHEN YOU NED TO CALL, CALL.

## 2017-02-01 ENCOUNTER — Encounter (INDEPENDENT_AMBULATORY_CARE_PROVIDER_SITE_OTHER): Payer: Self-pay | Admitting: "Endocrinology

## 2017-02-01 LAB — C-PEPTIDE: C-Peptide: 10.42 ng/mL — ABNORMAL HIGH (ref 0.80–3.85)

## 2017-02-01 LAB — COMPREHENSIVE METABOLIC PANEL
ALBUMIN: 3.9 g/dL (ref 3.6–5.1)
ALT: 112 U/L — ABNORMAL HIGH (ref 6–29)
AST: 99 U/L — AB (ref 10–35)
Alkaline Phosphatase: 117 U/L — ABNORMAL HIGH (ref 33–115)
BILIRUBIN TOTAL: 0.4 mg/dL (ref 0.2–1.2)
BUN: 11 mg/dL (ref 7–25)
CALCIUM: 9.2 mg/dL (ref 8.6–10.2)
CHLORIDE: 103 mmol/L (ref 98–110)
CO2: 21 mmol/L (ref 20–31)
Creat: 0.8 mg/dL (ref 0.50–1.10)
GLUCOSE: 171 mg/dL — AB (ref 70–99)
Potassium: 4.1 mmol/L (ref 3.5–5.3)
Sodium: 135 mmol/L (ref 135–146)
Total Protein: 6.9 g/dL (ref 6.1–8.1)

## 2017-02-01 LAB — TSH: TSH: 4.13 mIU/L

## 2017-02-01 LAB — MICROALBUMIN / CREATININE URINE RATIO
CREATININE, URINE: 123 mg/dL (ref 20–320)
MICROALB UR: 0.6 mg/dL
MICROALB/CREAT RATIO: 5 ug/mg{creat} (ref <30)

## 2017-02-01 LAB — T3, FREE: T3 FREE: 2.5 pg/mL (ref 2.3–4.2)

## 2017-02-01 LAB — T4, FREE: FREE T4: 1.3 ng/dL (ref 0.8–1.8)

## 2017-02-07 ENCOUNTER — Emergency Department (HOSPITAL_COMMUNITY): Payer: 59

## 2017-02-07 ENCOUNTER — Emergency Department (HOSPITAL_COMMUNITY)
Admission: EM | Admit: 2017-02-07 | Discharge: 2017-02-07 | Disposition: A | Discharge status: Home / Self Care | Payer: 59 | Attending: Emergency Medicine | Admitting: Emergency Medicine

## 2017-02-07 ENCOUNTER — Telehealth (INDEPENDENT_AMBULATORY_CARE_PROVIDER_SITE_OTHER): Payer: Self-pay | Admitting: "Endocrinology

## 2017-02-07 ENCOUNTER — Encounter (HOSPITAL_COMMUNITY): Payer: Self-pay

## 2017-02-07 DIAGNOSIS — R1011 Right upper quadrant pain: Secondary | ICD-10-CM | POA: Diagnosis not present

## 2017-02-07 DIAGNOSIS — E039 Hypothyroidism, unspecified: Secondary | ICD-10-CM | POA: Diagnosis not present

## 2017-02-07 DIAGNOSIS — Z79899 Other long term (current) drug therapy: Secondary | ICD-10-CM | POA: Insufficient documentation

## 2017-02-07 DIAGNOSIS — E119 Type 2 diabetes mellitus without complications: Secondary | ICD-10-CM | POA: Insufficient documentation

## 2017-02-07 DIAGNOSIS — R52 Pain, unspecified: Secondary | ICD-10-CM

## 2017-02-07 DIAGNOSIS — I10 Essential (primary) hypertension: Secondary | ICD-10-CM | POA: Diagnosis not present

## 2017-02-07 DIAGNOSIS — R1013 Epigastric pain: Secondary | ICD-10-CM | POA: Insufficient documentation

## 2017-02-07 DIAGNOSIS — Z87891 Personal history of nicotine dependence: Secondary | ICD-10-CM | POA: Insufficient documentation

## 2017-02-07 HISTORY — DX: Unspecified cirrhosis of liver: K74.60

## 2017-02-07 LAB — COMPREHENSIVE METABOLIC PANEL
ALBUMIN: 3.9 g/dL (ref 3.5–5.0)
ALT: 86 U/L — ABNORMAL HIGH (ref 14–54)
ANION GAP: 8 (ref 5–15)
AST: 87 U/L — ABNORMAL HIGH (ref 15–41)
Alkaline Phosphatase: 101 U/L (ref 38–126)
BILIRUBIN TOTAL: 0.5 mg/dL (ref 0.3–1.2)
BUN: 7 mg/dL (ref 6–20)
CO2: 23 mmol/L (ref 22–32)
Calcium: 9.4 mg/dL (ref 8.9–10.3)
Chloride: 105 mmol/L (ref 101–111)
Creatinine, Ser: 0.71 mg/dL (ref 0.44–1.00)
GFR calc Af Amer: >60 mL/min (ref >60)
GFR calc non Af Amer: >60 mL/min (ref >60)
Glucose, Bld: 129 mg/dL — ABNORMAL HIGH (ref 65–99)
POTASSIUM: 3.9 mmol/L (ref 3.5–5.1)
Sodium: 136 mmol/L (ref 135–145)
TOTAL PROTEIN: 7.4 g/dL (ref 6.5–8.1)

## 2017-02-07 LAB — URINALYSIS, ROUTINE W REFLEX MICROSCOPIC
Bilirubin Urine: NEGATIVE
Glucose, UA: NEGATIVE mg/dL
Hgb urine dipstick: NEGATIVE
Ketones, ur: NEGATIVE mg/dL
LEUKOCYTES UA: NEGATIVE
NITRITE: NEGATIVE
PH: 7 (ref 5.0–8.0)
Protein, ur: NEGATIVE mg/dL
SPECIFIC GRAVITY, URINE: 1.006 (ref 1.005–1.030)

## 2017-02-07 LAB — CBC
HCT: 39.8 % (ref 36.0–46.0)
HEMOGLOBIN: 13.3 g/dL (ref 12.0–15.0)
MCH: 27.1 pg (ref 26.0–34.0)
MCHC: 33.4 g/dL (ref 30.0–36.0)
MCV: 81.1 fL (ref 78.0–100.0)
Platelets: 187 10*3/uL (ref 150–400)
RBC: 4.91 MIL/uL (ref 3.87–5.11)
RDW: 17.2 % — ABNORMAL HIGH (ref 11.5–15.5)
WBC: 4.4 10*3/uL (ref 4.0–10.5)

## 2017-02-07 LAB — LIPASE, BLOOD: LIPASE: 39 U/L (ref 11–51)

## 2017-02-07 MED ORDER — SODIUM CHLORIDE 0.9 % IV BOLUS (SEPSIS)
1000.0000 mL | Freq: Once | INTRAVENOUS | Status: AC
  Administered 2017-02-07: 1000 mL via INTRAVENOUS

## 2017-02-07 MED ORDER — SUCRALFATE 1 G PO TABS: 1.0000 g | ORAL_TABLET | Freq: Three times a day (TID) | ORAL | 0 refills | Status: DC

## 2017-02-07 MED ORDER — PANTOPRAZOLE SODIUM 20 MG PO TBEC: 20.0000 mg | DELAYED_RELEASE_TABLET | Freq: Every day | ORAL | 0 refills | Status: DC

## 2017-02-07 MED ORDER — SUCRALFATE 1 G PO TABS
1.0000 g | ORAL_TABLET | Freq: Once | ORAL | Status: AC
  Administered 2017-02-07: 1 g via ORAL
  Filled 2017-02-07: qty 1

## 2017-02-07 MED ORDER — GI COCKTAIL ~~LOC~~: 30.0000 mL | Freq: Two times a day (BID) | ORAL | 0 refills | Status: DC | PRN

## 2017-02-07 MED ORDER — GI COCKTAIL ~~LOC~~
30.0000 mL | Freq: Once | ORAL | Status: AC
  Administered 2017-02-07: 30 mL via ORAL
  Filled 2017-02-07: qty 30

## 2017-02-07 MED ORDER — STERILE WATER FOR INJECTION IJ SOLN
INTRAMUSCULAR | Status: AC
  Administered 2017-02-07: 10 mL
  Filled 2017-02-07: qty 10

## 2017-02-07 MED ORDER — PANTOPRAZOLE SODIUM 40 MG IV SOLR
40.0000 mg | Freq: Once | INTRAVENOUS | Status: AC
  Administered 2017-02-07: 40 mg via INTRAVENOUS
  Filled 2017-02-07: qty 40

## 2017-02-07 NOTE — Discharge Instructions (Signed)
Read the information below.  Use the prescribed medication as directed.  Please discuss all new medications with your pharmacist.  You may return to the Emergency Department at any time for worsening condition or any new symptoms that concern you.   If you develop high fevers, worsening abdominal pain, uncontrolled vomiting, or are unable to tolerate fluids by mouth, return to the ER for a recheck.  ° °

## 2017-02-07 NOTE — ED Triage Notes (Signed)
Pt c/o constant burning abdominal pain for a few weeks that has gotten worse the last 2-3 days. Nausea and  diarrhea, no vomiting.

## 2017-02-07 NOTE — ED Provider Notes (Signed)
[facility] DEPT Provider Note   CSN: [id number] Arrival date & time: 02/07/17  1050     History   Chief Complaint Chief Complaint  Patient presents with  . Abdominal Pain    HPI Iowa Kappes is a 51 y.o. female.  HPI   Pt with hx cirrhosis (NASH), DM, GERD, kidney stones, PCOS, morbid obesity p/w upper abdominal pain x 1.5-2 weeks that is constant and gnawing/burning, worse with eating.  Associated belching and loose green stools.  Not improved with zantac or mylanta.  Denies fevers, urinary symptoms, chest pain.  Past abdominal surgeries appendectomy, right oophorectomy, left ovarian cystectomy.    In conversation with Dr Tobe Sos (endocrinology) who suspected pt might be reacting to previous steroid taper vs Victoza vs GERD-related vs pancreatitis.    Past Medical History:  Diagnosis Date  . Anxiety   . Arthritis    back, knees, right elbow  . Bipolar disorder ([place])   . Cirrhosis ([place])   . Dental crowns present   . Depression   . Diabetes mellitus without complication ([place])   . GERD (gastroesophageal reflux disease)   . High cholesterol    no current med.  Marland Kitchen History of kidney stones   . History of migraine headaches   . Hypertension    under control with meds., has been on med. x 4 yr.  . Hypothyroidism   . NASH (nonalcoholic steatohepatitis)   . PCOS (polycystic ovarian syndrome)   . PONV (postoperative nausea and vomiting)    also hx. of emergence delirium 2007  . Sleep apnea    uses CPAP nightly  . Trigeminal neuralgia     Patient Active Problem List   Diagnosis Date Noted  . Trigeminal neuralgia 01/18/2016  . Bipolar disorder ([place]) 11/28/2015  . Borderline personality disorder 11/28/2015  . Carpal tunnel syndrome 09/06/2015  . MDD (major depressive disorder) 06/06/2015  . Severe episode of recurrent major depressive disorder, without psychotic features ([place])   . History of posttraumatic stress disorder (PTSD)   . GAD (generalized  anxiety disorder)   . Suicidal ideation 06/05/2015  . Prediabetes 02/09/2015  . Insulin resistance 02/09/2015  . 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 Acanthosis nigricans, acquired 02/09/2015  . Nonalcoholic fatty liver disease 02/09/2015  . Hepatic cirrhosis ([place]) 02/09/2015  . Female hirsutism 02/09/2015  . Oligomenorrhea 02/09/2015  . Infertility associated with anovulation 02/09/2015  . Chest pain at rest 10/31/2014  . Morbid obesity ([place]) 10/31/2014  . Anxiety and depression 10/31/2014  . Chest pain 06/27/2014  . Obstructive sleep apnea 06/27/2014  . Diabetes mellitus, type 2 ([place]) 06/10/2013  . 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 Cystic teratoma   . PCOS (polycystic ovarian syndrome)   . Infertility, female   . TOBACCO USE, QUIT 02/08/2010  . HYPOTHYROIDISM, POSTSURGICAL 10/02/2007  . POLYCYSTIC OVARIAN DISEASE 10/02/2007  . Elevated cholesterol 10/02/2007  . DEPRESSION 10/02/2007  . Migraine 10/02/2007  . Essential hypertension 10/02/2007  . HYPERGLYCEMIA 10/02/2007  . UTI'S, HX OF 10/02/2007  . RENAL CALCULUS, HX OF 10/02/2007    Past Surgical History:  Procedure Laterality Date  . ACHILLES TENDON SURGERY Left 2007  . APPENDECTOMY    . CARPAL TUNNEL RELEASE Right 01/12/2016   Procedure: RIGHT CARPAL TUNNEL RELEASE;  Surgeon: Roseanne Kaufman, MD;  Location: [facility];  Service: Orthopedics;  Laterality: Right;  . COLONOSCOPY WITH PROPOFOL N/A 04/20/2014   Procedure: COLONOSCOPY WITH PROPOFOL;  Surgeon: Arta Silence, MD;  Location: [facility];  Service: Endoscopy;  Laterality:  N/A;  . ESOPHAGOGASTRODUODENOSCOPY (EGD) WITH PROPOFOL N/A 04/20/2014   Procedure: ESOPHAGOGASTRODUODENOSCOPY (EGD) WITH PROPOFOL;  Surgeon: Arta Silence, MD;  Location: [facility];  Service: Endoscopy;  Laterality: N/A;  . EXCISION HAGLUND'S DEFORMITY WITH ACHILLES TENDON REPAIR Right 02/25/2013   Procedure: RIGHT ACHILLES DEBRIDEMENT AND RECONSTRUCTION;  HAGLUND'S EXCISION;  GASTROC RECESSION AND FLEXOR HALLUCIS LONGUS TRANSFER;  Surgeon: Wylene Simmer, MD;  Location: [facility];  Service: Orthopedics;  Laterality: Right;  . LUMBAR LAMINECTOMY     X 3  . OVARIAN CYST SURGERY Left 1998  . PILONIDAL CYST EXCISION  1994  . STERIOD INJECTION Left 01/12/2016   Procedure: STEROID INJECTION LEFT WRIST;  Surgeon: Roseanne Kaufman, MD;  Location: [facility];  Service: Orthopedics;  Laterality: Left;  . TOTAL THYROIDECTOMY  2003  . TUBAL LIGATION Right 1984  . UPPER GI ENDOSCOPY  01/19/2015    OB History    Gravida Para Term Preterm AB Living   0             SAB TAB Ectopic Multiple Live Births                   Home Medications    Prior to Admission medications   Medication Sig Start Date End Date Taking? Authorizing Provider  Brexpiprazole (REXULTI) 2 MG TABS Take 2 mg by mouth daily.    Yes [provider]  clonazePAM (KLONOPIN) 1 MG tablet Take 0.5-1 mg by mouth 3 (three) times daily. Take 0.5mg  twice daily and then take 1 mg at bedtime 02/03/17  Yes [provider]  cyclobenzaprine (FLEXERIL) 10 MG tablet Take 10 mg by mouth daily as needed.   Yes [provider]  lamoTRIgine (LAMICTAL) 150 MG tablet Take 300 mg by mouth daily.    Yes [provider]  lansoprazole (PREVACID) 30 MG capsule Take 1 capsule (30 mg total) by mouth daily at 12 noon. 01/31/17 01/31/18 Yes [name], MD  lisinopril-hydrochlorothiazide (PRINZIDE,ZESTORETIC) 10-12.5 MG tablet Take 1 tablet by mouth every morning. 06/06/15  Yes Niel Hummer, NP  ranitidine (ZANTAC) 150 MG tablet Take 150 mg by mouth 2 (two) times daily.   Yes [provider]  SYNTHROID 200 MCG tablet Take 1.5 of brand Synthroid, 200 mcg tablets each morning. Patient taking differently: Take 200-300 mcg by mouth daily before breakfast. Take 1.5 of brand Synthroid, 200 mcg tablets each morning. 06/10/16 06/10/17 Yes [name], MD  traZODone  (DESYREL) 50 MG tablet Take 1 tablet (50 mg total) by mouth at bedtime and may repeat dose one time if needed. Patient taking differently: Take 200 mg by mouth at bedtime and may repeat dose one time if needed.  06/06/15  Yes Kerrie [place], NP  Alum & Mag Hydroxide-Simeth (GI COCKTAIL) SUSP suspension Take 30 mLs by mouth 2 (two) times daily as needed for indigestion. Shake well. 02/07/17   [place] Bibles, PA-C  Blood Glucose Monitoring Suppl (FREESTYLE LITE) DEVI Use to check glucose 12/24/16   [name], MD  glucose blood (FREESTYLE LITE) test strip Use to check glucose 3x daily 12/24/16   [name], MD  pantoprazole (PROTONIX) 20 MG tablet Take 1 tablet (20 mg total) by mouth daily. 02/07/17   [place] Bibles, PA-C  sucralfate (CARAFATE) 1 g tablet Take 1 tablet (1 g total) by mouth 4 (four) times daily -  with meals and at bedtime. 02/07/17   [place] Bibles, PA-C    Family History Family  History  Problem Relation Age of Onset  . Heart disease Father   . Depression Father   . Cancer Brother        THROAT  . Depression Brother   . Anxiety disorder Brother   . Depression Sister   . Breast cancer Mother   . Hypertension Mother     Social History Social History  Substance Use Topics  . Smoking status: Former Smoker    Packs/day: 0.50    Years: 15.00    Types: Cigarettes    Quit date: 12/08/2016  . Smokeless tobacco: Never Used     Comment: stopped   . Alcohol use No     Comment: Has not had any alcohol since 07/2014 - before this date, she rarely drink.     Allergies   Sulfa antibiotics; Bupropion; Bupropion hcl; Codeine; and Sulfamethoxazole-trimethoprim   Review of Systems Review of Systems  All other systems reviewed and are negative.    Physical Exam Updated Vital Signs BP 112/81 (BP Location: Left Arm)   Pulse 84   Temp 98.3 F (36.8 C) (Oral)   Resp 16   Ht 5\' 11"  (1.803 m)   Wt (!) 147 kg (324 lb)   SpO2 100%   BMI 45.19 kg/m   Physical Exam    Constitutional: She appears well-developed and well-nourished. No distress.  HENT:  Head: Normocephalic and atraumatic.  Neck: Neck supple.  Cardiovascular: Normal rate and regular rhythm.   Pulmonary/Chest: Effort normal and breath sounds normal. No respiratory distress. She has no wheezes. She has no rales.  Abdominal: Soft. She exhibits no distension. There is tenderness in the right upper quadrant and epigastric area. There is no rebound and no guarding.  obese  Neurological: She is alert.  Skin: She is not diaphoretic.  Nursing note and vitals reviewed.    ED Treatments / Results  Labs (all labs ordered are listed, but only abnormal results are displayed) Labs Reviewed  COMPREHENSIVE METABOLIC PANEL - Abnormal; Notable for the following:       Result Value   Glucose, Bld 129 (*)    AST 87 (*)    ALT 86 (*)    All other components within normal limits  CBC - Abnormal; Notable for the following:    RDW 17.2 (*)    All other components within normal limits  URINALYSIS, ROUTINE W REFLEX MICROSCOPIC - Abnormal; Notable for the following:    Color, Urine STRAW (*)    All other components within normal limits  LIPASE, BLOOD    EKG  EKG Interpretation None       Radiology US Abdomen Limited Ruq  Result Date: 02/07/2017 CLINICAL DATA:  Right upper quadrant pain for 2 weeks EXAM: ULTRASOUND ABDOMEN LIMITED RIGHT UPPER QUADRANT COMPARISON:  08/07/2015 FINDINGS: Gallbladder: No gallstones or wall thickening visualized. No sonographic Murphy sign noted by sonographer. Common bile duct: Diameter: 7 mm Liver: Increased in echogenicity consistent with fatty infiltration. IMPRESSION: Fatty liver. Mildly prominent common bile duct at 7 mm although no intrahepatic ductal dilatation is noted. Electronically Signed   By: Inez Catalina M.D.   On: 02/07/2017 14:54    Procedures Procedures (including critical care time)  Medications Ordered in ED Medications  pantoprazole (PROTONIX)  injection 40 mg (40 mg Intravenous Given 02/07/17 1231)  gi cocktail (Maalox,Lidocaine,Donnatal) (30 mLs Oral Given 02/07/17 1231)  sodium chloride 0.9 % bolus 1,000 mL (0 mLs Intravenous Stopped 02/07/17 1400)  sterile water (preservative free) injection (10 mLs  Given 02/07/17 1231)  sucralfate (CARAFATE) tablet 1 g (1 g Oral Given 02/07/17 1529)     Initial Impression / Assessment and Plan / ED Course  I have reviewed the triage vital signs and the nursing notes.  Pertinent labs & imaging results that were available during my care of the patient were reviewed by me and considered in my medical decision making (see chart for details).  Clinical Course as of Feb 07 1534  Fri Feb 07, 2017  1529 Pt reports improvement of symptoms.  Declines further pain or nausea medication.    [EW]    Clinical Course User Index [EW] [place], [place] Delgadillo, [place]    Afebrile, nontoxic patient with epigastric pain, improved with IVF, IV PPI, antiemetic.  Pt requested additional medication - carafate given.  Labs significant for chronically abnormal LFTs, improved from prior.  [place] without cholecystitis or cholelithiasis.  Doubt choledocholithiasis or cholangitis.  Lipase is normal. Doubt pancreatitis.  Pt tolerating PO.  Abdominal exam is nonsurgical. Likely GERD vs PUD.   D/C home with protonix, carafate, GI cocktail, PCP, GI follow up.  Pt has already changed Victoza as directed by endocrinology.   Discussed result, findings, treatment, and follow up  with patient.  Pt given return precautions.  Pt verbalizes understanding and agrees with plan.       Final Clinical Impressions(s) / ED Diagnoses   Final diagnoses:  Pain  Epigastric pain    New Prescriptions New Prescriptions   ALUM & MAG HYDROXIDE-SIMETH (GI COCKTAIL) SUSP SUSPENSION    Take 30 mLs by mouth 2 (two) times daily as needed for indigestion. Shake well.   PANTOPRAZOLE (PROTONIX) 20 MG TABLET    Take 1 tablet (20 mg total) by mouth daily.   SUCRALFATE (CARAFATE)  1 G TABLET    Take 1 tablet (1 g total) by mouth 4 (four) times daily -  with meals and at bedtime.     [place] Bibles, Hershal Coria 02/07/17 [place], Nathan, MD 02/08/17 [id number]

## 2017-02-07 NOTE — Telephone Encounter (Signed)
1. Patient sent me a text yesterday that she was having constant burning pain in her stomach and was belching a lot. She felt awful. She was taking Victoza at a dose of 0.6 mg plus 8 clicks/day. She had not reduced the Victoza dose, as I had asked her to do at our last visit on 01/31/17, if she developed any GI symptoms.  2. When I tried to contact her by phone I was not able to do so. I was able to send her a text to stop the Victoza and to take 30 mL of Mylanta liquid very two hours for 4 doses. I asked her to contact me in the morning if she was still having problems. She agreed. 3. She sent me another text at about 9 AM this morning that she was still having burning and was miserable. She asked if she should call [facility] where she had previously had an EGD. I suggested that she do so. 4. A few minutes later she texted me again that she had tried to contact [facility] but could not access a GI doctor on this holiday weekend. 5. I called her and we talked. She is having epigastric burning and feels bad. I told her that she could have a recurrence of her previous acid-related disease or could have pancreatitis. It was also possible that she was having a reaction to the sterid dose packs that she had taken previously or to a combinations of several of the factors listed above.  I told her to go to the ED at either [facility] or [facility]. She said that she would go to [facility]. I asked her to let me know the results of this evaluation.  Tillman Sers, MD, CDE Adult and Pediatric Endocrinology

## 2017-02-07 NOTE — ED Notes (Signed)
Pt ambulatory to bathroom

## 2017-02-14 DIAGNOSIS — F3181 Bipolar II disorder: Secondary | ICD-10-CM | POA: Diagnosis not present

## 2017-02-18 ENCOUNTER — Telehealth (INDEPENDENT_AMBULATORY_CARE_PROVIDER_SITE_OTHER): Payer: Self-pay | Admitting: "Endocrinology

## 2017-02-18 DIAGNOSIS — IMO0001 Reserved for inherently not codable concepts without codable children: Secondary | ICD-10-CM

## 2017-02-18 DIAGNOSIS — E1165 Type 2 diabetes mellitus with hyperglycemia: Principal | ICD-10-CM

## 2017-02-18 MED ORDER — INSULIN GLARGINE 100 UNIT/ML SOLOSTAR PEN: PEN_INJECTOR | SUBCUTANEOUS | 12 refills | Status: DC

## 2017-02-18 NOTE — Telephone Encounter (Signed)
Received telephone call from  [place]. 1. Overall status: BGs have been higher since discontinuing Victoza. She continues to have eoigastric pain and burning that is a bit better since discontinuing Victoza.on 02/07/17. When she went to the ED on 02/07/17 her lipase was normal at 39 (ref 11-51). Her AST was elevated at 87 and her ALT was elevated at 86.  2. New problems: None. She has previously been intolerant to metformin.  3. Medications: Pantoprazole, carafate, Synthroid, Clonopin, Lamictal, Prinizide, trazodone 4. BG log: She usually checks her BGs before breakfast.  02/14/17: 233 02/15/17: 228 02/16/17: 197 02/17/17: 242 02/18/17: 288 6. Assessment:   A. BGs are definitely too high.   B. I doubt that the Victoza is the cause of her GI symptoms, in part due to the fact that her Lipase level was normal on 02/07/17 and in part on the fact that her symptoms have not significantly improved in the 11 days that she has not been taking Victoza.   C. Since she is allergic to sulfa drugs, the use of sulfonylurea medications is relatively contraindicated. If she did have pancreatitis as an adverse effect of the Victoza, the use of DPP-4 inhibitors is also relatively contraindicated.  7. Plan: Start Lantus insulin, 6 units daily in the evening. Check BGs at breakfast and at dinner.  8. FU call: Contact GI clinic at [facility]. Thursday evening with BG report.  Tillman Sers, MD, CDE

## 2017-02-21 ENCOUNTER — Telehealth (INDEPENDENT_AMBULATORY_CARE_PROVIDER_SITE_OTHER): Payer: Self-pay | Admitting: "Endocrinology

## 2017-02-21 NOTE — Telephone Encounter (Signed)
Received telephone call from  1. Overall status: Things are OK. Her stomach feels better. 2. New problems: None 3. Lantus dose: She started  6 units the evening of 02/19/17. 4. Rapid-acting insulin: Non 5. BG log: 2 AM, Breakfast, Lunch, Supper, Bedtime 02/19/17" xxx, 237, xxx, 258, xxx 02/20/18: xxx, 180, xxx, 300, xxx 02/21/17: xxx, 199, xxx, 248 6. Assessment: BGs are better, but still too high.  7. Plan: Increase the Lantus dose to 8 units now.  8. FU call: Sunday evening Tillman Sers, MD, CDE

## 2017-02-23 ENCOUNTER — Telehealth (INDEPENDENT_AMBULATORY_CARE_PROVIDER_SITE_OTHER): Payer: Self-pay | Admitting: "Endocrinology

## 2017-02-23 NOTE — Telephone Encounter (Signed)
Received telephone call from [place] 1. Overall status: She is still having some abdominal pian, cramping, and watery diarrhea. She has an appointment at [facility] later this week.  2. New problems: None 3. Lantus dose: 8 units 4. BG log: 2 AM, Breakfast, Lunch, Supper, Bedtime 02/22/17: xxx, 233, xxx, 258, xxx 02/23/17: xxx, 190, xxx, 190, xxx 6. Assessment: BGs are somewhat better. She is not having any hypoglycemic symptoms. 7. Plan: Increase the Lantus dose to 10 units. 8. FU call: Wednesday evening Tillman Sers, MD, CDE

## 2017-02-26 ENCOUNTER — Telehealth (INDEPENDENT_AMBULATORY_CARE_PROVIDER_SITE_OTHER): Payer: Self-pay | Admitting: "Endocrinology

## 2017-02-26 NOTE — Telephone Encounter (Addendum)
Received telephone call from [place] 1. Overall status: "I'm OK, but very stressed". 2. 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 3. Lantus dose: 10 units 4. BG log: 2 AM, Breakfast, Lunch, Supper, Bedtime 02/22/17: xxx, 262, xxx, 221, xxx 02/25/17: xxx, 232, xxx, 268, xxx 02/26/17: xxx, 221, xxx, stress eating/362, xxx 5. Assessment: She needs more Lantus 6. Plan: Increase the Lantus dose to 14 units. 7. FU call: Friday evening Tillman Sers, MD, CDE

## 2017-02-27 DIAGNOSIS — F3181 Bipolar II disorder: Secondary | ICD-10-CM | POA: Diagnosis not present

## 2017-02-28 ENCOUNTER — Telehealth (INDEPENDENT_AMBULATORY_CARE_PROVIDER_SITE_OTHER): Payer: Self-pay | Admitting: "Endocrinology

## 2017-02-28 NOTE — Telephone Encounter (Signed)
Received telephone call from [place] 1. Overall status: She is very stressed. 2. New problems: none 3. Lantus dose: 14 units 4. BG log: 2 AM, Breakfast, Lunch, Supper, Bedtime 02/27/17: xxx, 224, xxx, 261, xxx 02/28/17: xxx, 256, xxx, 298, xxx 5. Assessment: Due to her job hunting stress the BGs are even higher, despite increasing the Lantus. She is likely doing more comfort eating. 6. Plan: Increase the Lantus dose to 18 units 7. FU call: Monday evening Tillman Sers, MD, CDE

## 2017-03-03 ENCOUNTER — Telehealth (INDEPENDENT_AMBULATORY_CARE_PROVIDER_SITE_OTHER): Payer: Self-pay | Admitting: "Endocrinology

## 2017-03-03 NOTE — Telephone Encounter (Signed)
Received telephone call from [place] 1. Overall status: Things are a little bit better. Her abdomen is doing much better. She missed her appointment with GI last Friday.  2. New problems: No news on the job front 3. Lantus dose: 18 units 4. BG log: 2 AM, Breakfast, Lunch, Supper, Bedtime 03/01/17: xxx, 220, xxx,230, xxxx 03/02/17: xxx, 212 xxx, 276, xxx 03/03/17: xxx, 206 xxx, 136, xxx - She did not eat much and was more active today.  6. Assessment:   A. The Lantus dose is about right for her based upon her meals and activity.  B. Since she missed her GI appointment and is now asymptomatic, we will probably never know whether she had an ulcer/gastritis or pancreatitis. It is more difficult, therefore, to know what to treat her with instead of insulin. 7. Plan: Continue the current plan.  8. FU call: Friday evening Tillman Sers, MD, CDE

## 2017-03-04 DIAGNOSIS — M50222 Other cervical disc displacement at C5-C6 level: Secondary | ICD-10-CM | POA: Diagnosis not present

## 2017-03-13 ENCOUNTER — Telehealth (INDEPENDENT_AMBULATORY_CARE_PROVIDER_SITE_OTHER): Payer: Self-pay | Admitting: "Endocrinology

## 2017-03-13 NOTE — Telephone Encounter (Signed)
1. I called Tracy [name] to check up on her BGs. Unfortunately, she was not available. 2. I left a voicemail message asking her to call me tomorrow evening if possible.  Tillman Sers, MD, CDE

## 2017-03-14 ENCOUNTER — Telehealth (INDEPENDENT_AMBULATORY_CARE_PROVIDER_SITE_OTHER): Payer: Self-pay | Admitting: "Endocrinology

## 2017-03-14 NOTE — Telephone Encounter (Signed)
Received telephone call from Tracy [name] 1. Overall status: She is doing fine. Stomach is fine. She cancelled her GI clinic appointment at [facility] because she was feeling so well. 2. New problems: None 3. Lantus dose: 18 units 4. BG log: 2 AM, Breakfast, Lunch, Supper, Bedtime 03/12/17: xxx, 168, xxx, xxx,xxx 03/13/17: xxx, 198, xxx, 280, xxx 03/14/17: xxx, 249, xxx, 284 6. Assessment: She needs much more insulin. 7. Plan: Increase the Lantus to 21 units. 8. FU call: Wednesday evening Tillman Sers, MD, CDE

## 2017-03-19 ENCOUNTER — Telehealth (INDEPENDENT_AMBULATORY_CARE_PROVIDER_SITE_OTHER): Payer: Self-pay | Admitting: "Endocrinology

## 2017-03-19 NOTE — Telephone Encounter (Signed)
Received telephone call from [place] 1. Overall status: She is doing OK. Her husband just lost his job today,  2. New problems: She is frustrated that her BGs are still high despite having almost cut out carbs.  3. Lantus dose: 21 units. 4. BG log: 2 AM, Breakfast, Lunch, Supper, Bedtime 03/17/17: xxx, 211, xxx, 242, xxx 03/18/17: xxx, 232, xxx, 205, xxx 03/19/17: xxx, 219, xxx 194, xxx 5. Assessment: BGs are a bit better, but she still needs more insulin.  6. Plan: Increase the Lantus dose to 25 units 7. FU call: Sunday evening, but earlier if BGs are <80 Tillman Sers, MD, CDE

## 2017-03-21 DIAGNOSIS — F3181 Bipolar II disorder: Secondary | ICD-10-CM | POA: Diagnosis not present

## 2017-03-23 ENCOUNTER — Telehealth (INDEPENDENT_AMBULATORY_CARE_PROVIDER_SITE_OTHER): Payer: Self-pay | Admitting: "Endocrinology

## 2017-03-23 NOTE — Telephone Encounter (Signed)
Received telephone call from [place] 1. Overall status: BGs are not much better 2. New problems: None 3. Lantus dose: 25 units 4. BG log: 2 AM, Breakfast, Lunch, Supper, Bedtime 03/21/17: xxx, 215, xxx, 177, xxx 03/22/17: xxx, 210, xxx, xxx, xxx 03/23/17: xxx, 214, xxx, 260, xxx 5. Assessment:She needs more insulin.  6. Plan: Increase the Lantus to 30 units. Repeat C-peptide and CMP at her next visit. Consider starting an SGLT-2 inhibitor.  7. FU call: Wednesday evening Tillman Sers, MD, CDE

## 2017-03-26 ENCOUNTER — Telehealth (INDEPENDENT_AMBULATORY_CARE_PROVIDER_SITE_OTHER): Payer: Self-pay | Admitting: "Endocrinology

## 2017-03-26 NOTE — Telephone Encounter (Signed)
Received telephone call from [place] 1. Overall status: Things are OK. 2. New problems: None 3. Lantus dose: 30 units 4. BG log: 2 AM, Breakfast, Lunch, Supper, Bedtime 03/2017: xxx, 208, xxx, 254, xxx 03/25/17: xxx, 182, xxx, 245, xxx 03/26/17: xxx, 208, xxx, 240, xxx 5. Assessment: She needs more insulin.  6. Plan: Increase the Lantus dose to 36 units. 7. FU call: Monday evening Tillman Sers, MD, CDE

## 2017-03-31 ENCOUNTER — Telehealth (INDEPENDENT_AMBULATORY_CARE_PROVIDER_SITE_OTHER): Payer: Self-pay | Admitting: "Endocrinology

## 2017-03-31 NOTE — Telephone Encounter (Signed)
Received telephone call from [place] 1. Overall status: BGs are better.  2. New problems: Nothing 3. Lantus dose: 36 units 4. BG log: 2 AM, Breakfast, Lunch, Supper, Bedtime 03/29/17: xxx, 171, xxx, 234, xxx 03/30/17: xxx, 170, xxx, xxx, xxx 03/31/17: xxx, 170, xxx, 164, xxx 5. Assessment: BGs are better.  6. Plan: Increase the Lantus dose to 38 units. 7. FU call: Friday evening, or earlier if BG is <80 Tillman Sers, MD, CDE

## 2017-04-02 ENCOUNTER — Encounter (INDEPENDENT_AMBULATORY_CARE_PROVIDER_SITE_OTHER): Payer: Self-pay | Admitting: "Endocrinology

## 2017-04-02 ENCOUNTER — Ambulatory Visit (INDEPENDENT_AMBULATORY_CARE_PROVIDER_SITE_OTHER): Payer: 59 | Admitting: "Endocrinology

## 2017-04-02 VITALS — BP 122/68 | HR 80 | Ht 71.26 in | Wt 324.0 lb

## 2017-04-02 DIAGNOSIS — R600 Localized edema: Secondary | ICD-10-CM

## 2017-04-02 DIAGNOSIS — E890 Postprocedural hypothyroidism: Secondary | ICD-10-CM | POA: Diagnosis not present

## 2017-04-02 DIAGNOSIS — R1013 Epigastric pain: Secondary | ICD-10-CM | POA: Diagnosis not present

## 2017-04-02 DIAGNOSIS — F3132 Bipolar disorder, current episode depressed, moderate: Secondary | ICD-10-CM

## 2017-04-02 DIAGNOSIS — E1110 Type 2 diabetes mellitus with ketoacidosis without coma: Secondary | ICD-10-CM | POA: Diagnosis not present

## 2017-04-02 DIAGNOSIS — B353 Tinea pedis: Secondary | ICD-10-CM | POA: Diagnosis not present

## 2017-04-02 DIAGNOSIS — Z794 Long term (current) use of insulin: Secondary | ICD-10-CM

## 2017-04-02 DIAGNOSIS — L83 Acanthosis nigricans: Secondary | ICD-10-CM | POA: Diagnosis not present

## 2017-04-02 DIAGNOSIS — I10 Essential (primary) hypertension: Secondary | ICD-10-CM | POA: Diagnosis not present

## 2017-04-02 DIAGNOSIS — R5383 Other fatigue: Secondary | ICD-10-CM

## 2017-04-02 LAB — POCT GLUCOSE (DEVICE FOR HOME USE): POC Glucose: 214 mg/dl — AB (ref 70–99)

## 2017-04-02 MED ORDER — KETOCONAZOLE 2 % EX CREA: TOPICAL_CREAM | Freq: Two times a day (BID) | CUTANEOUS | Status: AC

## 2017-04-02 MED ORDER — RANITIDINE HCL 150 MG PO TABS: 150.0000 mg | ORAL_TABLET | Freq: Two times a day (BID) | ORAL | 11 refills | Status: DC

## 2017-04-02 MED ORDER — LANSOPRAZOLE 30 MG PO CPDR: DELAYED_RELEASE_CAPSULE | ORAL | 6 refills | Status: DC

## 2017-04-02 NOTE — Progress Notes (Signed)
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 should have seen severe hyperpigmentation, which has not been present. If she had had severe, chronic Cushing's syndrome due to autonomous adrenal nodules, I would not expect a sudden reversal. If she had had a cortisol-producing mass lesion, it should have shown up on CT of her abdomen, but did not. If she had had a severe prolactinoma as the cause of her oligomenorrhea, her prolactin should have been elevated and the tumor should have been a macroprolactinoma and should have been visible on prior images of the brain. A macroprolactinoma, however, would not usually have contributed to T2DM.   C. She had been having menses almost regularly for the past year, but skipped in May and then skipped again in July.  As she's gained weight, the oligomenorrhea has worsened.   D. 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 USE PROTECTION.  14. Fatigue: She does much better in terms of sleeping and fatigue when she uses her C-pap machine. Her past hypothyroidism was probably also a factor in her fatigue. It also appears that her depression has contributed to her fatigue.  15. Edema: This problem is variable, but is not  evident today.   16. Tachycardia: She is not tachycardic today.  17. Tinea pedis: her tinea pedis is worse and she is now having some cracking of the skin of the callus over her right toe MTP joint.    PLAN:  1. Diagnostic: CBG today. We reviewed the results of her lab tests in June and July. Call Dr. Tobe Sos on Friday evening.   2. Therapeutic: Start ketoconazole 2% cream, twice daily on her feet. Increase the Synthroid to 300 mcg/day on 4 days each week and 200 mcg/day on three days each week. Continue Lantus each evening. Continue ranitidine and lansoprazole, 30 mg/day. Eat Right Diet. Exercise daily, with a goal of gradually increasing her duration of exercise to at least one hour per day. Consider adding Jardiance once daily and reducing her Lantus dose in two weeks. Consider adding spironolactone. Do a daily schedule. 3. Patient education: We discussed all of the above at great length. We discussed her post-surgical hypothyroidism, T2DM, hypertension, and dyspepsia, to include the need for her to take her medications for these problems consistently and reliably. I again cautioned her about having unprotected intercourse. I think that it would be unwise to refer her for bariatric surgery at this time. She is still too emotionally fragile.   4. Follow-up: 2 months  Level of Service: This visit lasted in excess of 50 minutes. More than 50% of the visit was devoted to counseling.  Tillman Sers, MD, CDE Adult [facility] Pediatric Endocrinology

## 2017-04-02 NOTE — Patient Instructions (Signed)
Follow up visit in one month. Change Synthroid to 300 mcg/day for four days each week and 200 mcg/day on three days each week.

## 2017-04-03 DIAGNOSIS — F3181 Bipolar II disorder: Secondary | ICD-10-CM | POA: Diagnosis not present

## 2017-04-04 ENCOUNTER — Telehealth (INDEPENDENT_AMBULATORY_CARE_PROVIDER_SITE_OTHER): Payer: Self-pay | Admitting: "Endocrinology

## 2017-04-04 NOTE — Telephone Encounter (Signed)
Received telephone call from [place] 1. Overall status: She is doing good. Her BGs are looking better. She has two job interviews next week. 2. New problems: None 3. Lantus dose: 38 units 4. BG log: 2 AM, Breakfast, Lunch, Supper, Bedtime 04/03/17: xxx, 159, xxx, xxx,xxx 04/04/17: xxx, 144, xxx, 142, xxx 6. Assessment: BGs are doing pretty well. 7. Plan: Continue the current Lantus dose. 8. FU call: Monday evening Tillman Sers, MD, CDE

## 2017-04-07 ENCOUNTER — Telehealth (INDEPENDENT_AMBULATORY_CARE_PROVIDER_SITE_OTHER): Payer: Self-pay | Admitting: "Endocrinology

## 2017-04-07 NOTE — Telephone Encounter (Signed)
Received telephone call from [place] 1. Overall status: She has another job interview Thursday. 2. New problems: None 3. Lantus dose: 38 units 4. BG log: 2 AM, Breakfast, Lunch, Supper, Bedtime 04/05/17: xxx, 170, xxx, 132, xxx 04/06/17: xxx, 149, xxx, 201, xxx 04/07/17: xxx, 164, xxx, 159 5. Assessment: BGs have been a bit higher this week.  6. Plan: Increase the Lantus dose to 40 units. 7. FU call: next Monday evening Tillman Sers, MD, CDE

## 2017-04-14 ENCOUNTER — Telehealth (INDEPENDENT_AMBULATORY_CARE_PROVIDER_SITE_OTHER): Payer: Self-pay | Admitting: "Endocrinology

## 2017-04-14 NOTE — Telephone Encounter (Signed)
Received telephone call from [place] 1. Overall status: She is doing OK. BGs are much better. She will be re-hired soon at [employer]. 2. New problems: None 3. Lantus dose: 40 units 4. BG log: 2 AM, Breakfast, Lunch, Supper, Bedtime 04/12/17: xxx, 124, xxx, 125, xxx 04/13/17: xxx, 137, xxx, post-prandial 211, xxx 04/14/17: xxx, 180, xxx, 152, xxx 5. Assessment: BGs are better, but she still needs more insulin and lower carb intake. 6. Plan: Increase the Lantus dose to 42 units. 7. FU call: next Monday evening Tillman Sers, MD, CDE

## 2017-04-21 ENCOUNTER — Telehealth (INDEPENDENT_AMBULATORY_CARE_PROVIDER_SITE_OTHER): Payer: Self-pay | Admitting: "Endocrinology
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
## 2017-05-01 ENCOUNTER — Encounter (INDEPENDENT_AMBULATORY_CARE_PROVIDER_SITE_OTHER): Payer: Self-pay | Admitting: "Endocrinology

## 2017-05-01 ENCOUNTER — Ambulatory Visit (INDEPENDENT_AMBULATORY_CARE_PROVIDER_SITE_OTHER): Payer: 59 | Admitting: "Endocrinology

## 2017-05-01 VITALS — BP 110/64 | HR 84 | Ht 71.26 in | Wt 320.2 lb

## 2017-05-01 DIAGNOSIS — R600 Localized edema: Secondary | ICD-10-CM

## 2017-05-01 DIAGNOSIS — IMO0001 Reserved for inherently not codable concepts without codable children: Secondary | ICD-10-CM

## 2017-05-01 DIAGNOSIS — L680 Hirsutism: Secondary | ICD-10-CM | POA: Diagnosis not present

## 2017-05-01 DIAGNOSIS — E1065 Type 1 diabetes mellitus with hyperglycemia: Secondary | ICD-10-CM | POA: Diagnosis not present

## 2017-05-01 DIAGNOSIS — K760 Fatty (change of) liver, not elsewhere classified: Secondary | ICD-10-CM

## 2017-05-01 DIAGNOSIS — E890 Postprocedural hypothyroidism: Secondary | ICD-10-CM

## 2017-05-01 DIAGNOSIS — R5383 Other fatigue: Secondary | ICD-10-CM | POA: Diagnosis not present

## 2017-05-01 DIAGNOSIS — R1013 Epigastric pain: Secondary | ICD-10-CM

## 2017-05-01 DIAGNOSIS — E282 Polycystic ovarian syndrome: Secondary | ICD-10-CM

## 2017-05-01 DIAGNOSIS — B353 Tinea pedis: Secondary | ICD-10-CM

## 2017-05-01 DIAGNOSIS — I10 Essential (primary) hypertension: Secondary | ICD-10-CM | POA: Diagnosis not present

## 2017-05-01 DIAGNOSIS — E11649 Type 2 diabetes mellitus with hypoglycemia without coma: Secondary | ICD-10-CM

## 2017-05-01 DIAGNOSIS — L83 Acanthosis nigricans: Secondary | ICD-10-CM

## 2017-05-01 LAB — POCT GLUCOSE (DEVICE FOR HOME USE): POC Glucose: 101 mg/dl — AB (ref 70–99)

## 2017-05-01 LAB — POCT GLYCOSYLATED HEMOGLOBIN (HGB A1C): Hemoglobin A1C: 8.3

## 2017-05-01 MED ORDER — KETOCONAZOLE 2 % EX CREA: TOPICAL_CREAM | Freq: Two times a day (BID) | CUTANEOUS | Status: AC

## 2017-05-01 NOTE — Progress Notes (Signed)
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 This degree of oligomenorrhea and infertility is common in women with severe PCOS/SLS.   B. What I don't understand, however, is why she suddenly began having regular menses in January 2016, during the same time period that her BGs suddenly came under control. While I doubt that starting C-pap treatment produced these dramatic improvements, I don't have another explanation. I wonder if she had been hypercortisolemic in the past. If she had had severe, chronic Cushing's disease, we should have seen severe hyperpigmentation, which has not been present. If she had had severe, chronic Cushing's syndrome due to autonomous adrenal nodules, I would not expect a sudden reversal. If she had had a cortisol-producing mass lesion, it should have shown up on CT of her abdomen, but did not. If she had had a severe prolactinoma as the cause of her oligomenorrhea, her prolactin should have been elevated and the tumor should have been a macroprolactinoma and should have been visible on prior images of the brain. A macroprolactinoma, however, would not usually have contributed to T2DM.   C. She had been having  menses almost regularly for the past year, but skipped in May and then skipped again in July.  As she's gained weight, the oligomenorrhea has worsened.   D. 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 USE PROTECTION.  14. Fatigue: She has more energy now.  15. Edema: This problem is variable, but is not evident today.   16. Tachycardia: She is not tachycardic today.  17. Tinea pedis: Her tinea pedis is worse and she is now having some cracking of the skin of the callus over her right toe MTP joint.  She needs to start ketoconazole.  PLAN:  1. Diagnostic: CBG and HbA1c today. C-peptide, CMP, testosterone, androstenedione, Order TFTs in 2 months. Call Dr. Tobe Sos in two weeks 2. Therapeutic: Start ketoconazole 2% cream, twice daily on her feet. Change Synthroid to 300 mcg/day on 4 days each week and 200 mcg/day on three days each week. Continue Lantus each evening. Continue ranitidine and lansoprazole. Eat Right Diet. Exercise daily, with a goal of gradually increasing her duration of exercise to at least one hour per day. Consider adding Jardiance once daily and reducing her Lantus dose in two weeks. Consider adding spironolactone. 3. Patient education: We discussed all of the above at great length. We discussed her post-surgical hypothyroidism, T2DM, hypertension, and dyspepsia, to include the need for her to take her medications for these problems consistently and reliably. I again cautioned her about having unprotected intercourse. I think that it would be unwise to refer her for bariatric surgery at this time. She is still too emotionally fragile and needs to get back to work.   4. Follow-up: 2 months  Level of Service: This visit lasted in excess of 55 minutes. More than 50% of the visit was devoted to counseling.  Tillman Sers, MD, CDE Adult and  Pediatric Endocrinology

## 2017-05-01 NOTE — Patient Instructions (Addendum)
Follow up visit in 4 weeks. Change Synthroid to 300 mcg/day on 4 days each week and 200 mcg/day on three days each week.

## 2017-05-06 ENCOUNTER — Other Ambulatory Visit (INDEPENDENT_AMBULATORY_CARE_PROVIDER_SITE_OTHER): Payer: Self-pay | Admitting: "Endocrinology

## 2017-05-06 ENCOUNTER — Encounter (INDEPENDENT_AMBULATORY_CARE_PROVIDER_SITE_OTHER): Payer: Self-pay | Admitting: *Deleted

## 2017-05-06 DIAGNOSIS — E1165 Type 2 diabetes mellitus with hyperglycemia: Principal | ICD-10-CM

## 2017-05-06 DIAGNOSIS — IMO0001 Reserved for inherently not codable concepts without codable children: Secondary | ICD-10-CM

## 2017-05-06 LAB — COMPREHENSIVE METABOLIC PANEL
AG RATIO: 1.4 (calc) (ref 1.0–2.5)
ALT: 52 U/L — AB (ref 6–29)
AST: 51 U/L — ABNORMAL HIGH (ref 10–35)
Albumin: 3.9 g/dL (ref 3.6–5.1)
Alkaline phosphatase (APISO): 89 U/L (ref 33–115)
BUN: 13 mg/dL (ref 7–25)
CHLORIDE: 104 mmol/L (ref 98–110)
CO2: 25 mmol/L (ref 20–32)
Calcium: 9.4 mg/dL (ref 8.6–10.2)
Creat: 0.84 mg/dL (ref 0.50–1.10)
GLOBULIN: 2.8 g/dL (ref 1.9–3.7)
GLUCOSE: 88 mg/dL (ref 65–99)
Potassium: 4.1 mmol/L (ref 3.5–5.3)
SODIUM: 138 mmol/L (ref 135–146)
TOTAL PROTEIN: 6.7 g/dL (ref 6.1–8.1)
Total Bilirubin: 0.4 mg/dL (ref 0.2–1.2)

## 2017-05-06 LAB — TESTOS,TOTAL,FREE AND SHBG (FEMALE)
FREE TESTOSTERONE: 3.7 pg/mL (ref 0.1–6.4)
SEX HORMONE BINDING: 52 nmol/L (ref 17–124)
TESTOSTERONE, TOTAL, LC-MS-MS: 40 ng/dL (ref 2–45)

## 2017-05-06 LAB — C-PEPTIDE: C PEPTIDE: 5.61 ng/mL — AB (ref 0.80–3.85)

## 2017-05-06 LAB — ANDROSTENEDIONE: ANDROSTENEDIONE: 37 ng/dL

## 2017-05-06 LAB — DHEA-SULFATE: DHEA SO4: 42 ug/dL (ref 19–231)

## 2017-05-06 MED ORDER — INSULIN GLARGINE 100 UNIT/ML SOLOSTAR PEN: PEN_INJECTOR | SUBCUTANEOUS | 12 refills | Status: DC

## 2017-05-06 NOTE — Progress Notes (Signed)
1. I saw the patient at the outpatient pharmacy. She said that she needs to have her Lantus insulin prescription updated because she is taking more Lantus insulin than the original prescription indicated. 2. I submitted an e-scrip for one 5-pack of Lantus Solostar pens, take 43 units per day, with 12 refills. I also called the pharmacy to ensure that they had the information. They did. Tillman Sers, MD, CDE

## 2017-05-17 ENCOUNTER — Encounter (INDEPENDENT_AMBULATORY_CARE_PROVIDER_SITE_OTHER): Payer: Self-pay | Admitting: "Endocrinology

## 2017-05-19 ENCOUNTER — Telehealth (INDEPENDENT_AMBULATORY_CARE_PROVIDER_SITE_OTHER): Payer: Self-pay | Admitting: "Endocrinology

## 2017-05-19 NOTE — Telephone Encounter (Signed)
1. Patient had a question and asked that I call her.  2. She wanted me to explain her C-peptide and her testosterone level.  3. She is still producing excessive amounts of insulin but 50% lower than when she was taking Victoza. Her fatty tissue is causing significant insulin resistance.  4. Her testosterone of 40 is high for a woman. Her DHEAS and androstenedione are quite normal.  5. Her LFTs are still elevated, but lower than 3 months ago. The increased fatty tissue is also causing this problem.  6. All of these issues are remediable if she loses more fatty tissue.  Tillman Sers, MD, CDE

## 2017-05-22 ENCOUNTER — Ambulatory Visit (INDEPENDENT_AMBULATORY_CARE_PROVIDER_SITE_OTHER): Payer: 59 | Admitting: "Endocrinology

## 2017-06-12 ENCOUNTER — Ambulatory Visit (INDEPENDENT_AMBULATORY_CARE_PROVIDER_SITE_OTHER): Payer: 59 | Admitting: "Endocrinology

## 2017-06-12 ENCOUNTER — Encounter (INDEPENDENT_AMBULATORY_CARE_PROVIDER_SITE_OTHER): Payer: Self-pay | Admitting: "Endocrinology

## 2017-06-12 VITALS — BP 120/66 | HR 80 | Ht 71.5 in | Wt 317.4 lb

## 2017-06-12 DIAGNOSIS — L83 Acanthosis nigricans: Secondary | ICD-10-CM

## 2017-06-12 DIAGNOSIS — K760 Fatty (change of) liver, not elsewhere classified: Secondary | ICD-10-CM | POA: Diagnosis not present

## 2017-06-12 DIAGNOSIS — R1013 Epigastric pain: Secondary | ICD-10-CM

## 2017-06-12 DIAGNOSIS — R000 Tachycardia, unspecified: Secondary | ICD-10-CM

## 2017-06-12 DIAGNOSIS — E890 Postprocedural hypothyroidism: Secondary | ICD-10-CM

## 2017-06-12 DIAGNOSIS — E282 Polycystic ovarian syndrome: Secondary | ICD-10-CM | POA: Diagnosis not present

## 2017-06-12 DIAGNOSIS — R600 Localized edema: Secondary | ICD-10-CM

## 2017-06-12 DIAGNOSIS — E119 Type 2 diabetes mellitus without complications: Secondary | ICD-10-CM

## 2017-06-12 DIAGNOSIS — B353 Tinea pedis: Secondary | ICD-10-CM

## 2017-06-12 DIAGNOSIS — I10 Essential (primary) hypertension: Secondary | ICD-10-CM | POA: Diagnosis not present

## 2017-06-12 DIAGNOSIS — Z794 Long term (current) use of insulin: Secondary | ICD-10-CM

## 2017-06-12 DIAGNOSIS — R5383 Other fatigue: Secondary | ICD-10-CM

## 2017-06-12 DIAGNOSIS — L680 Hirsutism: Secondary | ICD-10-CM | POA: Diagnosis not present

## 2017-06-12 DIAGNOSIS — I4711 Inappropriate sinus tachycardia, so stated: Secondary | ICD-10-CM

## 2017-06-12 LAB — POCT GLUCOSE (DEVICE FOR HOME USE): POC GLUCOSE: 86 mg/dL (ref 70–99)

## 2017-06-12 MED ORDER — KETOCONAZOLE 2 % EX CREA: TOPICAL_CREAM | Freq: Two times a day (BID) | CUTANEOUS | Status: AC

## 2017-06-12 NOTE — Progress Notes (Signed)
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   B. What I don't understand, however, is why she suddenly began having regular menses in January 2016, during the same time period that her BGs suddenly came under control. While I doubt that starting C-pap treatment produced these dramatic improvements, I don't have another explanation. I wonder if she had been hypercortisolemic in the past. If she had had severe, chronic Cushing's disease, we should have seen severe hyperpigmentation, which has not been present. If she had had severe, chronic Cushing's syndrome due to autonomous adrenal nodules, I would not expect a sudden reversal. If she had had a cortisol-producing mass lesion, it should have shown up on CT of her abdomen, but did not. If she had had a severe prolactinoma as the cause of her oligomenorrhea, her prolactin should have been elevated and the tumor should have been a macroprolactinoma and should have been visible on prior images of the brain. A macroprolactinoma, however, would not usually have contributed to T2DM.   C. She had been having menses almost regularly for the past year, but skipped in May and then skipped again in July.  As she gained weight, the oligomenorrhea has worsened. As she  lost weight, however, menses became more regular.  D. 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 USE PROTECTION.  14. Fatigue: She has more energy now.  15. Edema: This problem is not evident today.   16. Tachycardia: She is not tachycardic today.  17. Tinea pedis: Her tinea pedis is worse and she is now having some cracking of the skin of the callus over her right toe MTP joint.  She needs to start ketoconazole.  PLAN:  1. Diagnostic: CBG today. Order TFTs in 2 months. Call Dr. Tobe Sos on the first Sunday in December.  2. Therapeutic: Start ketoconazole 2% cream, twice daily on her feet. Continue Synthroid doses of 300 mcg/day on 4 days each week and 200 mcg/day on three days each week. Reduce Lantus to 40 units each evening. Continue ranitidine and lansoprazole. Eat Right Diet. Exercise daily, with a goal of gradually increasing her duration of exercise to at least one hour per day. Consider adding Jardiance once daily and reducing her Lantus dose in two weeks. Consider adding spironolactone. 3. Patient education: We discussed all of the above at great length. We discussed her post-surgical hypothyroidism, T2DM, hypertension, and dyspepsia, to include the need for her to take her medications for these problems consistently and reliably. I again cautioned her about having unprotected intercourse. I think that it would be unwise to refer her for bariatric surgery at this time. She is still too emotionally fragile and needs to get back to work.   4. Follow-up: 2 months  Level of Service: This visit lasted in excess of 50 minutes. More than 50% of the visit was devoted to counseling.  Tillman Sers, MD, CDE Adult and Pediatric Endocrinology

## 2017-06-12 NOTE — Patient Instructions (Addendum)
Follow up visit in 2 months. Reduce the Lantus dose to 40 units each evening. Call Dr. Tobe Sos on the first Sunday evening in December to discuss BGs. Call earlier if having BGs <80.

## 2017-07-03 ENCOUNTER — Encounter: Payer: Self-pay | Admitting: Nurse Practitioner

## 2017-07-03 ENCOUNTER — Ambulatory Visit (INDEPENDENT_AMBULATORY_CARE_PROVIDER_SITE_OTHER): Payer: 59 | Admitting: Nurse Practitioner

## 2017-07-03 ENCOUNTER — Other Ambulatory Visit (INDEPENDENT_AMBULATORY_CARE_PROVIDER_SITE_OTHER): Payer: Self-pay | Admitting: "Endocrinology

## 2017-07-03 VITALS — BP 128/78 | HR 79 | Temp 98.4°F | Ht 70.0 in | Wt 323.0 lb

## 2017-07-03 DIAGNOSIS — H938X2 Other specified disorders of left ear: Secondary | ICD-10-CM | POA: Diagnosis not present

## 2017-07-03 DIAGNOSIS — H6122 Impacted cerumen, left ear: Secondary | ICD-10-CM | POA: Diagnosis not present

## 2017-07-03 DIAGNOSIS — I10 Essential (primary) hypertension: Secondary | ICD-10-CM

## 2017-07-03 DIAGNOSIS — E890 Postprocedural hypothyroidism: Secondary | ICD-10-CM

## 2017-07-03 NOTE — Assessment & Plan Note (Signed)
Well controlled. Continue current medications and low sodium diet. CMET 05/01/17 with normal renal function, electrolytes

## 2017-07-03 NOTE — Progress Notes (Signed)
90

## 2017-07-03 NOTE — Patient Instructions (Addendum)
For your ear dryness: You may try vaseline, just a small dab to your fingertip and rub to your ear canal daily- to see if this helps with the dryness. Please don't stick anything in your ears (like qtips).   Id like to see you back in 6 months for a follow up of your blood pressure, or for your annual physical, whichever you need first. Of course, I am always here for other visits if needed.  It was nice to meet you. Welcome to [facility]!

## 2017-07-03 NOTE — Progress Notes (Signed)
Subjective:    Patient ID: Tracy [name], female    DOB: Jun 30, 1966, 51 y.o.   MRN: [id number]  HPI Tracy [name] is a 51yo female who presents today to establish care. She presents today with a chief complaint of left ear fullness  Left ear fullness - This began several weeks ago. This not painful. She reports sounds are "muffle." She says that she can hear a "rustling noise" in her ear. Her ears art itchy. She has a history of fungal ear infections related to her diabetes. She was following up with an ENT for regular ear cleanings and treatment in the past but she does not want to go back to ENT at this time. She began using flonase daily to help with allergy symptoms.  Hypertension- managed by her past PCP. Maintained on lisinopril-hctz 10-12.5mg  1 tablet daily. She denies adverse reactions- dizziness, cough, low blood pressure. She denies headaches, vision changes, chest pain, shortness of breath.  Readings at home 110s-120s/60s-70s daily. She follows a strict low sodium diet.  BP Readings from Last 3 Encounters:  07/03/17 128/78  06/12/17 120/66  05/01/17 110/64    Review of Systems  Constitutional: Negative.  Negative for activity change and appetite change.  HENT: Positive for congestion. Negative for dental problem, ear discharge, facial swelling and voice change.   Eyes: Negative for visual disturbance.  Respiratory: Negative for cough and shortness of breath.   Cardiovascular: Negative for chest pain and palpitations.  Gastrointestinal: Negative for constipation and diarrhea.  Endocrine: Negative for cold intolerance and heat intolerance.  Genitourinary: Negative for difficulty urinating and hematuria.  Musculoskeletal: Positive for arthralgias and back pain. Negative for myalgias.  Skin: Negative for rash.  Allergic/Immunologic: Negative for environmental allergies and food allergies.  Neurological: Negative for speech difficulty and headaches.  Hematological:  Does not bruise/bleed easily.  Psychiatric/Behavioral:       Negative for depression or anxiety.    Past Medical History:  Diagnosis Date  . Anxiety   . Arthritis    back, knees, right elbow  . Bipolar disorder ([place])   . Cirrhosis ([place])   . Dental crowns present   . Depression   . Diabetes mellitus without complication ([place])   . GERD (gastroesophageal reflux disease)   . High cholesterol    no current med.  Marland Kitchen History of kidney stones   . History of migraine headaches   . Hypertension    under control with meds., has been on med. x 4 yr.  . Hypothyroidism   . NASH (nonalcoholic steatohepatitis)   . PCOS (polycystic ovarian syndrome)   . PONV (postoperative nausea and vomiting)    also hx. of emergence delirium 2007  . Sleep apnea    uses CPAP nightly  . Trigeminal neuralgia      Social History   Socioeconomic History  . Marital status: Married    Spouse name: Not on file  . Number of children: 0  . Years of education: 37  . Highest education level: Not on file  Social Needs  . Financial resource strain: Not on file  . Food insecurity - worry: Not on file  . Food insecurity - inability: Not on file  . Transportation needs - medical: Not on file  . Transportation needs - non-medical: Not on file  Occupational History  . Occupation: RN- PICU  Tobacco Use  . Smoking status: Former Smoker    Packs/day: 0.50    Years: 15.00    Pack years: 7.50  Types: Cigarettes    Last attempt to quit: 12/08/2016    Years since quitting: 0.5  . Smokeless tobacco: Never Used  . Tobacco comment: stopped   Substance and Sexual Activity  . Alcohol use: No    Alcohol/week: 0.0 oz    Comment: Has not had any alcohol since 07/2014 - before this date, she rarely drink.  . Drug use: No  . Sexual activity: No    Partners: Male  Other Topics Concern  . Not on file  Social History Narrative   RN - [employer]   Regular exercise: no   Caffeine use: 2 x daily   Right-handed.   Lives  alone.    Past Surgical History:  Procedure Laterality Date  . ACHILLES TENDON SURGERY Left 2007  . APPENDECTOMY    . CARPAL TUNNEL RELEASE Right 01/12/2016   Procedure: RIGHT CARPAL TUNNEL RELEASE;  Surgeon: Roseanne Kaufman, MD;  Location: [facility];  Service: Orthopedics;  Laterality: Right;  . COLONOSCOPY WITH PROPOFOL N/A 04/20/2014   Procedure: COLONOSCOPY WITH PROPOFOL;  Surgeon: Arta Silence, MD;  Location: [facility];  Service: Endoscopy;  Laterality: N/A;  . ESOPHAGOGASTRODUODENOSCOPY (EGD) WITH PROPOFOL N/A 04/20/2014   Procedure: ESOPHAGOGASTRODUODENOSCOPY (EGD) WITH PROPOFOL;  Surgeon: Arta Silence, MD;  Location: [facility];  Service: Endoscopy;  Laterality: N/A;  . EXCISION HAGLUND'S DEFORMITY WITH ACHILLES TENDON REPAIR Right 02/25/2013   Procedure: RIGHT ACHILLES DEBRIDEMENT AND RECONSTRUCTION;  HAGLUND'S EXCISION; GASTROC RECESSION AND FLEXOR HALLUCIS LONGUS TRANSFER;  Surgeon: Wylene Simmer, MD;  Location: [facility];  Service: Orthopedics;  Laterality: Right;  . LUMBAR LAMINECTOMY     X 3  . neck fusion    . OVARIAN CYST SURGERY Left 1998  . PILONIDAL CYST EXCISION  1994  . RIGHT OOPHORECTOMY    . STERIOD INJECTION Left 01/12/2016   Procedure: STEROID INJECTION LEFT WRIST;  Surgeon: Roseanne Kaufman, MD;  Location: [facility];  Service: Orthopedics;  Laterality: Left;  . TOTAL THYROIDECTOMY  2003  . UPPER GI ENDOSCOPY  01/19/2015    Family History  Problem Relation Age of Onset  . Heart disease Father   . Depression Father   . Cancer Brother        THROAT  . Depression Brother   . Anxiety disorder Brother   . Depression Sister   . Breast cancer Mother   . Hypertension Mother     Allergies  Allergen Reactions  . Sulfa Antibiotics Itching, Swelling and Rash  . Bupropion Rash  . Bupropion Hcl Itching and Rash  . Codeine Itching  . Sulfamethoxazole-Trimethoprim Rash    Current Outpatient Medications on File Prior  to Visit  Medication Sig Dispense Refill  . Alum & Mag Hydroxide-Simeth (GI COCKTAIL) SUSP suspension Take 30 mLs by mouth 2 (two) times daily as needed for indigestion. Shake well. 300 mL 0  . Blood Glucose Monitoring Suppl (FREESTYLE LITE) DEVI Use to check glucose 2 each 4  . Brexpiprazole (REXULTI) 2 MG TABS Take 2 mg by mouth daily.     . clonazePAM (KLONOPIN) 1 MG tablet Take 0.5-1 mg by mouth 3 (three) times daily. Take 0.5mg  twice daily and then take 1 mg at bedtime  5  . cyclobenzaprine (FLEXERIL) 10 MG tablet Take 10 mg by mouth daily as needed.    Marland Kitchen glucose blood (FREESTYLE LITE) test strip Use to check glucose 3x daily 300 each 4  . Insulin Glargine (LANTUS SOLOSTAR) 100 UNIT/ML Solostar Pen Take 43  units once daily. Dispense one 5-pack of lantus solostar pens. 15 mL 12  . lamoTRIgine (LAMICTAL) 150 MG tablet Take 300 mg by mouth daily.     . lansoprazole (PREVACID) 30 MG capsule Take once daily. 30 capsule 6  . lisinopril-hydrochlorothiazide (PRINZIDE,ZESTORETIC) 10-12.5 MG tablet Take 1 tablet by mouth every morning.    . ranitidine (ZANTAC) 150 MG tablet Take 1 tablet (150 mg total) by mouth 2 (two) times daily. 60 tablet 11  . traZODone (DESYREL) 50 MG tablet Take 1 tablet (50 mg total) by mouth at bedtime and may repeat dose one time if needed. 30 tablet 0  . SYNTHROID 200 MCG tablet Take 1.5 of brand Synthroid, 200 mcg tablets each morning. (Patient taking differently: Take 200-300 mcg by mouth daily before breakfast. Take 1.5 of brand Synthroid, 200 mcg tablets each morning.) 135 tablet 4   Current Facility-Administered Medications on File Prior to Visit  Medication Dose Route Frequency Provider Last Rate Last Dose  . ketoconazole (NIZORAL) 2 % cream   Topical BID [name], MD        BP 128/78   Pulse 79   Temp 98.4 F (36.9 C) (Oral)   Ht 5\' 10"  (1.778 m)   Wt (!) 323 lb (146.5 kg)   LMP 05/05/2017   SpO2 96%   BMI 46.35 kg/m        Objective:    Physical Exam  Constitutional: She is oriented to person, place, and time. She appears well-developed and well-nourished. No distress.  Obese BMI  HENT:  Head: Normocephalic and atraumatic.  Right Ear: External ear normal. No drainage, swelling or tenderness. No mastoid tenderness. Tympanic membrane is not injected.  Left Ear: External ear normal. No drainage, swelling or tenderness. No mastoid tenderness. Tympanic membrane is not injected.  Bilateral ear canals with large flakes of skin obstruction view of TM prior to irrigation. Bilateral ear canals with whitened patches of dry scaly skin remaining to canal wall after irrigation.   Cardiovascular: Normal rate, regular rhythm, normal heart sounds and intact distal pulses.  Pulmonary/Chest: Effort normal and breath sounds normal.  Neurological: She is alert and oriented to person, place, and time. Coordination normal.  Skin: Skin is warm and dry.  Psychiatric: She has a normal mood and affect. Judgment and thought content normal.        Assessment & Plan:  Left ear fullness- Flaking, dry skin in bilateral ears, large flakes of skin obstructing view of left TM prior to irrigation. There are patches of white scaly skin questionable for dry skin vs fungus of external ear canal after irrigation. No external/internal ear swelling, redness, pain. No drainage. She will avoid q-tips and try rubbing vaseline to canal entry daily. She will try OTC claritin, allegra or zyrtec to help with ear itching. She will follow up If no improvement after irrigation today and with conservative methods at home. We may need to reconsider ENT referral for persistent symptoms.

## 2017-07-07 ENCOUNTER — Encounter: Payer: Self-pay | Admitting: Nurse Practitioner

## 2017-07-07 MED ORDER — SYNTHROID 200 MCG PO TABS: ORAL_TABLET | ORAL | 4 refills | Status: DC

## 2017-07-10 ENCOUNTER — Telehealth: Payer: Self-pay | Admitting: Family Medicine

## 2017-07-10 NOTE — Telephone Encounter (Signed)
Faxed roi to Dr.Elizabeth Ernie Hew to release records to [employer]. 12/6/18bPWR

## 2017-07-18 ENCOUNTER — Encounter: Payer: Self-pay | Admitting: Nurse Practitioner

## 2017-07-18 DIAGNOSIS — M545 Low back pain, unspecified: Secondary | ICD-10-CM

## 2017-07-18 DIAGNOSIS — K648 Other hemorrhoids: Secondary | ICD-10-CM | POA: Insufficient documentation

## 2017-07-18 HISTORY — DX: Other hemorrhoids: K64.8

## 2017-07-18 HISTORY — DX: Low back pain, unspecified: M54.50

## 2017-07-21 DIAGNOSIS — M4802 Spinal stenosis, cervical region: Secondary | ICD-10-CM | POA: Diagnosis not present

## 2017-07-21 DIAGNOSIS — M50222 Other cervical disc displacement at C5-C6 level: Secondary | ICD-10-CM | POA: Diagnosis not present

## 2017-07-21 DIAGNOSIS — M4712 Other spondylosis with myelopathy, cervical region: Secondary | ICD-10-CM | POA: Diagnosis not present

## 2017-07-21 DIAGNOSIS — M50121 Cervical disc disorder at C4-C5 level with radiculopathy: Secondary | ICD-10-CM | POA: Diagnosis not present

## 2017-07-25 DIAGNOSIS — F3181 Bipolar II disorder: Secondary | ICD-10-CM | POA: Diagnosis not present

## 2017-08-12 ENCOUNTER — Ambulatory Visit (INDEPENDENT_AMBULATORY_CARE_PROVIDER_SITE_OTHER): Payer: 59 | Admitting: "Endocrinology

## 2017-08-12 ENCOUNTER — Other Ambulatory Visit (INDEPENDENT_AMBULATORY_CARE_PROVIDER_SITE_OTHER): Payer: Self-pay | Admitting: *Deleted

## 2017-08-12 ENCOUNTER — Encounter (INDEPENDENT_AMBULATORY_CARE_PROVIDER_SITE_OTHER): Payer: Self-pay | Admitting: "Endocrinology

## 2017-08-12 VITALS — BP 112/68 | HR 90 | Wt 321.0 lb

## 2017-08-12 DIAGNOSIS — R600 Localized edema: Secondary | ICD-10-CM | POA: Diagnosis not present

## 2017-08-12 DIAGNOSIS — F419 Anxiety disorder, unspecified: Secondary | ICD-10-CM

## 2017-08-12 DIAGNOSIS — E119 Type 2 diabetes mellitus without complications: Secondary | ICD-10-CM | POA: Diagnosis not present

## 2017-08-12 DIAGNOSIS — E890 Postprocedural hypothyroidism: Secondary | ICD-10-CM | POA: Diagnosis not present

## 2017-08-12 DIAGNOSIS — L83 Acanthosis nigricans: Secondary | ICD-10-CM | POA: Diagnosis not present

## 2017-08-12 DIAGNOSIS — E11649 Type 2 diabetes mellitus with hypoglycemia without coma: Secondary | ICD-10-CM | POA: Diagnosis not present

## 2017-08-12 DIAGNOSIS — E282 Polycystic ovarian syndrome: Secondary | ICD-10-CM | POA: Diagnosis not present

## 2017-08-12 DIAGNOSIS — F329 Major depressive disorder, single episode, unspecified: Secondary | ICD-10-CM

## 2017-08-12 DIAGNOSIS — B353 Tinea pedis: Secondary | ICD-10-CM

## 2017-08-12 DIAGNOSIS — R1013 Epigastric pain: Secondary | ICD-10-CM | POA: Diagnosis not present

## 2017-08-12 DIAGNOSIS — I10 Essential (primary) hypertension: Secondary | ICD-10-CM

## 2017-08-12 DIAGNOSIS — K760 Fatty (change of) liver, not elsewhere classified: Secondary | ICD-10-CM

## 2017-08-12 LAB — POCT GLUCOSE (DEVICE FOR HOME USE): POC GLUCOSE: 237 mg/dL — AB (ref 70–99)

## 2017-08-12 LAB — POCT GLYCOSYLATED HEMOGLOBIN (HGB A1C): HEMOGLOBIN A1C: 7.2

## 2017-08-12 MED ORDER — FREESTYLE LIBRE READER DEVI: 1.0000 | 1 refills | Status: DC

## 2017-08-12 MED ORDER — FREESTYLE LIBRE SENSOR SYSTEM MISC: 1 refills | Status: DC

## 2017-08-12 NOTE — Progress Notes (Signed)
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   C. What I don't understand, however, is why she suddenly began having regular menses in January 2016, during the same time period that her BGs suddenly came under control. While I doubt that starting C-pap treatment produced these dramatic improvements, I don't have another explanation. I wonder if she had been hypercortisolemic in the past. If she had had severe, chronic Cushing's disease, we should have seen severe hyperpigmentation, which has not been present. If she had had severe, chronic Cushing's [name] due to autonomous adrenal nodules, I would not expect a sudden reversal. If she had had a cortisol-producing mass lesion, it should have shown up on CT of her abdomen, but did not. If she had had a severe prolactinoma as the cause of her oligomenorrhea, her prolactin should have been elevated and the tumor should have been a macroprolactinoma and should have been visible on prior images of the brain. A macroprolactinoma, however, would not usually have contributed to Tracy [name].   D. When she lost weight, her oligomenorrhea improved. She had been having menses almost regularly for the past year. However, as she regained weight, the oligomenorrhea worsened and she skipped periods in May, in July, and in November.  E. 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 USE PROTECTION.  14. Fatigue: She has more energy now.  15. Edema: This problem is more evident today.   16. Tachycardia: She is not tachycardic today. She has been taking in too much salt recently. 17. Tinea pedis: Her tinea pedis is better today. She needs to continue ketoconazole.  PLAN:  1. Diagnostic: CBG and HbA1c today. Order TFTs today. Bring in your BG meter for download in two weeks.   2. Therapeutic: Continue ketoconazole 2% cream, twice daily on her feet. Continue Synthroid doses of 300 mcg/day on 4 days each week and 200 mcg/day on three days each week. Continue the Lantus dose of 38 units each evening. Continue ranitidine. Eat Right Diet. Exercise daily, with a goal of gradually increasing her duration of exercise to at least one hour per day. Consider adding Jardiance once daily and reducing her Lantus dose in two weeks. Consider adding spironolactone. 3. Patient education: See Ms Matilde Sprang this week. We discussed all of the above at great length. 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 I again cautioned her about having unprotected intercourse. I think that it would be unwise to refer her for bariatric surgery at this time. She is still too emotionally fragile and needs to get back to work.   4. Follow-up: 2 months  Level of Service: This visit lasted in excess of 50 minutes. More than 50% of the visit was devoted to counseling.  Tillman Sers, MD, CDE [facility]in[facility]

## 2017-08-12 NOTE — Patient Instructions (Signed)
Follow up visit in two months. Please bring in your BG meter in about two weeks.

## 2017-08-18 ENCOUNTER — Encounter (INDEPENDENT_AMBULATORY_CARE_PROVIDER_SITE_OTHER): Payer: Self-pay | Admitting: "Endocrinology

## 2017-08-18 DIAGNOSIS — F4323 Adjustment disorder with mixed anxiety and depressed mood: Secondary | ICD-10-CM | POA: Diagnosis not present

## 2017-08-18 DIAGNOSIS — E890 Postprocedural hypothyroidism: Secondary | ICD-10-CM | POA: Diagnosis not present

## 2017-08-18 DIAGNOSIS — F3181 Bipolar II disorder: Secondary | ICD-10-CM | POA: Diagnosis not present

## 2017-08-18 DIAGNOSIS — Z79899 Other long term (current) drug therapy: Secondary | ICD-10-CM | POA: Diagnosis not present

## 2017-08-18 LAB — TSH: TSH: 1.18 m[IU]/L

## 2017-08-18 LAB — T3, FREE: T3, Free: 2.5 pg/mL (ref 2.3–4.2)

## 2017-08-18 LAB — T4, FREE: Free T4: 1.3 ng/dL (ref 0.8–1.8)

## 2017-08-19 LAB — CBC AND DIFFERENTIAL
HCT: 38 (ref 36–46)
Hemoglobin: 12.7 (ref 12.0–16.0)
NEUTROS ABS: 3875
PLATELETS: 194 (ref 150–399)
WBC: 6.2

## 2017-08-19 LAB — HEPATIC FUNCTION PANEL
ALT: 48 — AB (ref 7–35)
AST: 38 — AB (ref 13–35)
Alkaline Phosphatase: 105 (ref 25–125)
Bilirubin, Total: 0.3

## 2017-08-19 LAB — VITAMIN B12: Vitamin B-12: 372

## 2017-08-19 LAB — BASIC METABOLIC PANEL
BUN: 13 (ref 4–21)
Creatinine: 0.9 (ref <=1.1)
GLUCOSE: 267
Potassium: 4 (ref 3.4–5.3)
SODIUM: 135 — AB (ref 137–147)

## 2017-08-19 LAB — LIPID PANEL
Cholesterol: 198 (ref 0–200)
HDL: 38 (ref 35–70)
LDL CALC: 124
Triglycerides: 218 — AB (ref 40–160)

## 2017-08-19 LAB — HEMOGLOBIN A1C: Hemoglobin A1C: 7.8

## 2017-08-19 LAB — VITAMIN D 25 HYDROXY (VIT D DEFICIENCY, FRACTURES): Vit D, 25-Hydroxy: 11

## 2017-08-20 ENCOUNTER — Encounter (INDEPENDENT_AMBULATORY_CARE_PROVIDER_SITE_OTHER): Payer: Self-pay | Admitting: *Deleted

## 2017-08-21 ENCOUNTER — Encounter (INDEPENDENT_AMBULATORY_CARE_PROVIDER_SITE_OTHER): Payer: Self-pay | Admitting: *Deleted

## 2017-09-11 DIAGNOSIS — F3181 Bipolar II disorder: Secondary | ICD-10-CM | POA: Diagnosis not present

## 2017-09-16 ENCOUNTER — Encounter: Payer: Self-pay | Admitting: Nurse Practitioner

## 2017-09-17 DIAGNOSIS — F3181 Bipolar II disorder: Secondary | ICD-10-CM | POA: Diagnosis not present

## 2017-10-02 DIAGNOSIS — F3181 Bipolar II disorder: Secondary | ICD-10-CM | POA: Diagnosis not present

## 2017-10-07 DIAGNOSIS — F3181 Bipolar II disorder: Secondary | ICD-10-CM | POA: Diagnosis not present

## 2017-10-10 ENCOUNTER — Telehealth: Payer: Self-pay | Admitting: Nurse Practitioner

## 2017-10-10 NOTE — Telephone Encounter (Signed)
I got labs from her endocrinologist forwarded for my review. I just want to verify with the patient that endocrinology is following all of these labs?

## 2017-10-14 NOTE — Telephone Encounter (Signed)
It looks like these labs were ordered by you but drawn by the endo office. The labs need to be resulted from what I can tell.

## 2017-10-14 NOTE — Telephone Encounter (Signed)
Disregard note below. Labs were abstracted under you. LVM for pt to call back in regards.

## 2017-10-15 ENCOUNTER — Telehealth (INDEPENDENT_AMBULATORY_CARE_PROVIDER_SITE_OTHER): Payer: Self-pay | Admitting: "Endocrinology

## 2017-10-15 ENCOUNTER — Encounter (INDEPENDENT_AMBULATORY_CARE_PROVIDER_SITE_OTHER): Payer: Self-pay | Admitting: "Endocrinology

## 2017-10-15 ENCOUNTER — Ambulatory Visit (INDEPENDENT_AMBULATORY_CARE_PROVIDER_SITE_OTHER): Payer: 59 | Admitting: "Endocrinology

## 2017-10-15 VITALS — BP 110/60 | HR 65 | Ht 70.47 in | Wt 323.6 lb

## 2017-10-15 DIAGNOSIS — F3181 Bipolar II disorder: Secondary | ICD-10-CM | POA: Diagnosis not present

## 2017-10-15 DIAGNOSIS — E1165 Type 2 diabetes mellitus with hyperglycemia: Secondary | ICD-10-CM | POA: Diagnosis not present

## 2017-10-15 DIAGNOSIS — E8881 Metabolic syndrome: Secondary | ICD-10-CM | POA: Diagnosis not present

## 2017-10-15 DIAGNOSIS — IMO0001 Reserved for inherently not codable concepts without codable children: Secondary | ICD-10-CM

## 2017-10-15 LAB — POCT GLUCOSE (DEVICE FOR HOME USE): POC GLUCOSE: 195 mg/dL — AB (ref 70–99)

## 2017-10-15 NOTE — Progress Notes (Signed)
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 status. Her serum testosterone was elevated in September, but at the lower end of the range that we usually see in PCOS. Her androstenedione and DHEAS were well within the normal ranges.   B. 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 This degree of oligomenorrhea and infertility is common in women with severe PCOS/SLS.   C. What I don't understand, however, is why she suddenly began having regular menses in January 2016, during the same time period that her BGs suddenly came under control. While I doubt that starting C-pap treatment produced these dramatic improvements, I don't have another explanation. I wonder if she had been hypercortisolemic in the past. If she had had severe, chronic Cushing's disease, we should have seen severe hyperpigmentation, which has not been present. If she had had severe, chronic Cushing's syndrome due to autonomous adrenal nodules, I would not expect a sudden reversal. If she had had a cortisol-producing mass lesion, it should have shown up on CT of her abdomen, but did not. If she had had a severe prolactinoma as the cause of her oligomenorrhea, her prolactin should have been elevated and  the tumor should have been a macroprolactinoma and should have been visible on prior images of the brain. A macroprolactinoma, however, would not usually have contributed to T2DM.   D. When she lost weight, her oligomenorrhea improved. She had been having menses almost regularly for the past year. However, as she regained weight, the oligomenorrhea worsened and she skipped periods in May, in July, and in November 2018. Then when she gained more weight this Winter, her periods resumed.   E. 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 USE PROTECTION.  14. Fatigue: She has more fatigue now.  15. Edema: This problem is less evident today.   16. Tachycardia: She is not tachycardic today. 17. Tinea pedis: Her tinea pedis is worse today. She needs to continue ketoconazole.  PLAN:  1. Diagnostic: Check BGs twice daily at breakfast and at dinner.   2. Therapeutic: Continue ketoconazole 2% cream, twice daily on her feet. Continue Synthroid doses of 300 mcg/day on 4 days each week and 200 mcg/day on three days each week. Continue the Lantus dose of 38 units each evening. Continue ranitidine. Eat Right Diet. Exercise daily, with a goal of gradually increasing her duration of exercise to at least one hour per day. Consider adding Jardiance once daily and reducing her Lantus dose. Consider adding spironolactone. 3. Patient education: See Ms Matilde Sprang regularly. We discussed all of the above at great length. We discussed her post-surgical hypothyroidism, T2DM, hypertension, and dyspepsia, to include the need for her to take her medications for these problems consistently and reliably. I think that it would be unwise to refer her for bariatric surgery at this time. She is still too emotionally fragile and needs to get back to work.   4. Follow-up: 2 months  Level of Service: This visit lasted in excess of 50 minutes. More than  50% of the visit was devoted to counseling.  Tillman Sers, MD, CDE Adult and Pediatric Endocrinology

## 2017-10-15 NOTE — Telephone Encounter (Signed)
Called patient to schedule 2 month follow up with Dr. Tobe Sos.

## 2017-10-20 DIAGNOSIS — F3181 Bipolar II disorder: Secondary | ICD-10-CM | POA: Diagnosis not present

## 2017-10-23 NOTE — Telephone Encounter (Signed)
Spoke with patient about labs that were sent over by Endo and she stated that the vitamin D, A1c, and liver enzymes were addressed. She states that she has cirrosis of the liver and her liver enzymes have been off for years. Cholesterol and sodium have not been addressed. Please advise with any advice to give her. She states its really hard for her to get off work and she has a CPE in may with you.

## 2017-10-23 NOTE — Telephone Encounter (Signed)
We will redraw these labs at her physical in May. The cholesterol panel was not a full panel- we will get a full panel. The sodium was only slightly low.

## 2017-10-24 NOTE — Telephone Encounter (Signed)
LVM letting pt know response below.  

## 2017-10-29 DIAGNOSIS — F3181 Bipolar II disorder: Secondary | ICD-10-CM | POA: Diagnosis not present

## 2017-11-06 ENCOUNTER — Encounter (INDEPENDENT_AMBULATORY_CARE_PROVIDER_SITE_OTHER): Payer: Self-pay | Admitting: "Endocrinology

## 2017-11-07 DIAGNOSIS — F3181 Bipolar II disorder: Secondary | ICD-10-CM | POA: Diagnosis not present

## 2017-11-11 ENCOUNTER — Other Ambulatory Visit (INDEPENDENT_AMBULATORY_CARE_PROVIDER_SITE_OTHER): Payer: Self-pay | Admitting: "Endocrinology

## 2017-11-11 MED ORDER — FUROSEMIDE 20 MG PO TABS
20.0000 mg | ORAL_TABLET | Freq: Every day | ORAL | 6 refills | Status: DC
Start: 2017-11-11 — End: 2018-06-22

## 2017-11-11 NOTE — Progress Notes (Signed)
Patient sent me an e-mail requesting additional medication to control her leg edema. I sent in an order for Lasix, 20 mg, one tablet daily. I also asked her to contact me in one week with an update on her condition.  Tillman Sers, MD, CDE

## 2017-11-12 DIAGNOSIS — F3181 Bipolar II disorder: Secondary | ICD-10-CM | POA: Diagnosis not present

## 2017-12-05 ENCOUNTER — Encounter: Payer: Self-pay | Admitting: Nurse Practitioner

## 2017-12-09 ENCOUNTER — Encounter: Payer: Self-pay | Admitting: Nurse Practitioner

## 2017-12-18 ENCOUNTER — Encounter: Payer: Self-pay | Admitting: Nurse Practitioner

## 2017-12-23 ENCOUNTER — Ambulatory Visit: Payer: 59 | Admitting: Internal Medicine

## 2017-12-23 ENCOUNTER — Encounter: Payer: Self-pay | Admitting: Internal Medicine

## 2017-12-23 VITALS — BP 110/70 | HR 85 | Temp 98.6°F | Resp 16 | Wt 319.0 lb

## 2017-12-23 DIAGNOSIS — G43019 Migraine without aura, intractable, without status migrainosus: Secondary | ICD-10-CM

## 2017-12-23 MED ORDER — METHYLPREDNISOLONE ACETATE 80 MG/ML IJ SUSP
80.0000 mg | Freq: Once | INTRAMUSCULAR | Status: AC
  Administered 2017-12-23: 80 mg via INTRAMUSCULAR

## 2017-12-23 MED ORDER — RIZATRIPTAN BENZOATE 10 MG PO TABS: 10.0000 mg | ORAL_TABLET | ORAL | 0 refills | Status: DC | PRN

## 2017-12-23 MED ORDER — PROMETHAZINE HCL 25 MG PO TABS: 25.0000 mg | ORAL_TABLET | Freq: Three times a day (TID) | ORAL | 5 refills | Status: DC | PRN

## 2017-12-23 NOTE — Patient Instructions (Addendum)
You received a steroid injection today.    Maxalt and an anti-nausea medication was sent to your pharmacy.    Call Dr Rhea Belton office and schedule an appointment.

## 2017-12-23 NOTE — Assessment & Plan Note (Signed)
Severe intractable migraine-no obvious cause, but overall increased headaches and migraines over the past 3-4 months No concerning symptoms or exam findings Depo-Medrol 80 mg IM x1 now We will hold off on Phenergan since she has to drive home, but will send a prescription to her pharmacy for Phenergan Maxalt refilled Advised her to call Dr. Rhea Belton office today or tomorrow to set up a follow-up-hopefully she can be seen soon

## 2017-12-23 NOTE — Progress Notes (Signed)
Subjective:    Patient ID: Tracy [name], female    DOB: January 28, 1966, 52 y.o.   MRN: [id number]  HPI The patient is here for an acute visit.   Migraine headaches:  She has trigeminal neuralgia and migraines.  They have typically occurred together.  She was following with neurology, Dr. Krista Blue, in the past but has not seen her for a few years.  She has done injection therapy and acupuncture in the past.  They did get better, but for the past 3-4 months they have come back and she is unsure why.  She does work at a computer all day and that may be one cause.  Today she has a severe migraine.  She has associated nausea, vomiting and severe photophobia.  She also states she is not able to think straight.  This is typical of her migraines in the past.  She did take one Maxalt and typically that helps, but it does not seem to be helping.  She is also concerned that the trigeminal neuralgia will return.  For the past few months she has been taking Tylenol and Advil more regularly, which she should not be doing with her other medical problems.  She knows she should get back to see neurology.  She wants to make this migraine go away.  The pain is currently severe and she has severe photophobia.  She still has some nausea and did vomit earlier today.  She denies any numbness, tingling or weakness in her extremities.       Medications and allergies reviewed with patient and updated if appropriate.  Patient Active Problem List   Diagnosis Date Noted  . Lower back pain 07/18/2017  . Hemorrhoids, internal 07/18/2017  . Trigeminal neuralgia 01/18/2016  . Bipolar disorder ([place]) 11/28/2015  . Borderline personality disorder ([place]) 11/28/2015  . Carpal tunnel syndrome 09/06/2015  . MDD (major depressive disorder) 06/06/2015  . Severe episode of recurrent major depressive disorder, without psychotic features ([place])   . History of posttraumatic stress disorder (PTSD)   . GAD (generalized  anxiety disorder)   . Suicidal ideation 06/05/2015  . Insulin resistance 02/09/2015  . 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 Acanthosis nigricans, acquired 02/09/2015  . Nonalcoholic fatty liver disease 02/09/2015  . Hepatic cirrhosis ([place]) 02/09/2015  . Female hirsutism 02/09/2015  . Oligomenorrhea 02/09/2015  . Infertility associated with anovulation 02/09/2015  . Morbid obesity ([place]) 10/31/2014  . Anxiety and depression 10/31/2014  . Obstructive sleep apnea 06/27/2014  . Diabetes mellitus, type 2 ([place]) 06/10/2013  . 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 Cystic teratoma   . PCOS (polycystic ovarian syndrome)   . Infertility, female   . TOBACCO USE, QUIT 02/08/2010  . HYPOTHYROIDISM, POSTSURGICAL 10/02/2007  . POLYCYSTIC OVARIAN DISEASE 10/02/2007  . Elevated cholesterol 10/02/2007  . DEPRESSION 10/02/2007  . Migraine 10/02/2007  . Essential hypertension 10/02/2007  . RENAL CALCULUS, HX OF 10/02/2007    Current Outpatient Medications on File Prior to Visit  Medication Sig Dispense Refill  . Alum & Mag Hydroxide-Simeth (GI COCKTAIL) SUSP suspension Take 30 mLs by mouth 2 (two) times daily as needed for indigestion. Shake well. 300 mL 0  . Blood Glucose Monitoring Suppl (FREESTYLE LITE) DEVI Use to check glucose 2 each 4  . Brexpiprazole (REXULTI) 2 MG TABS Take 2 mg by mouth daily.     . clonazePAM (KLONOPIN) 1 MG tablet Take 0.5-1 mg by mouth 3 (three) times daily. Take 0.37m twice daily and then take 1 mg  at bedtime  5  . Continuous Blood Gluc Receiver (FREESTYLE LIBRE READER) DEVI 1 kit by Does not apply route every 3 (three) months. 1 Device 1  . Continuous Blood Gluc Sensor (FREESTYLE LIBRE SENSOR SYSTEM) MISC Use 1 sensor every 14 days 9 each 1  . cyclobenzaprine (FLEXERIL) 10 MG tablet Take 10 mg by mouth daily as needed.    . furosemide (LASIX) 20 MG tablet Take 1 tablet (20 mg total) by mouth daily. 30 tablet 6  . glucose blood (FREESTYLE LITE) test strip Use to check  glucose 3x daily 300 each 4  . Insulin Glargine (LANTUS SOLOSTAR) 100 UNIT/ML Solostar Pen Take 43 units once daily. Dispense one 5-pack of lantus solostar pens. 15 mL 12  . lamoTRIgine (LAMICTAL) 150 MG tablet Take 300 mg by mouth daily.     . lansoprazole (PREVACID) 30 MG capsule Take once daily. 30 capsule 6  . lisinopril-hydrochlorothiazide (PRINZIDE,ZESTORETIC) 10-12.5 MG tablet Take 1 tablet by mouth every morning.    . ranitidine (ZANTAC) 150 MG tablet Take 1 tablet (150 mg total) by mouth 2 (two) times daily. 60 tablet 11  . SYNTHROID 200 MCG tablet Take 1.5 of brand Synthroid, 200 mcg tablets each morning. 135 tablet 4  . traZODone (DESYREL) 50 MG tablet Take 1 tablet (50 mg total) by mouth at bedtime and may repeat dose one time if needed. 30 tablet 0   No current facility-administered medications on file prior to visit.     Past Medical History:  Diagnosis Date  . Anxiety   . Arthritis    back, knees, right elbow  . Bipolar disorder ([place])   . Cirrhosis ([place])   . Dental crowns present   . Depression   . Diabetes mellitus without complication ([place])   . GERD (gastroesophageal reflux disease)   . High cholesterol    no current med.  Marland Kitchen History of kidney stones   . History of migraine headaches   . Hypertension    under control with meds., has been on med. x 4 yr.  . Hypothyroidism   . NASH (nonalcoholic steatohepatitis)   . PCOS (polycystic ovarian syndrome)   . PONV (postoperative nausea and vomiting)    also hx. of emergence delirium 2007  . Sleep apnea    uses CPAP nightly  . Trigeminal neuralgia     Past Surgical History:  Procedure Laterality Date  . ACHILLES TENDON SURGERY Left 2007  . APPENDECTOMY    . CARPAL TUNNEL RELEASE Right 01/12/2016   Procedure: RIGHT CARPAL TUNNEL RELEASE;  Surgeon: Roseanne Kaufman, MD;  Location: [facility];  Service: Orthopedics;  Laterality: Right;  . COLONOSCOPY WITH PROPOFOL N/A 04/20/2014   Procedure: COLONOSCOPY  WITH PROPOFOL;  Surgeon: Arta Silence, MD;  Location: [facility];  Service: Endoscopy;  Laterality: N/A;  . ESOPHAGOGASTRODUODENOSCOPY (EGD) WITH PROPOFOL N/A 04/20/2014   Procedure: ESOPHAGOGASTRODUODENOSCOPY (EGD) WITH PROPOFOL;  Surgeon: Arta Silence, MD;  Location: [facility];  Service: Endoscopy;  Laterality: N/A;  . EXCISION HAGLUND'S DEFORMITY WITH ACHILLES TENDON REPAIR Right 02/25/2013   Procedure: RIGHT ACHILLES DEBRIDEMENT AND RECONSTRUCTION;  HAGLUND'S EXCISION; GASTROC RECESSION AND FLEXOR HALLUCIS LONGUS TRANSFER;  Surgeon: Wylene Simmer, MD;  Location: [facility];  Service: Orthopedics;  Laterality: Right;  . LUMBAR LAMINECTOMY     X 3  . neck fusion    . OVARIAN CYST SURGERY Left 1998  . PILONIDAL CYST EXCISION  1994  . RIGHT OOPHORECTOMY    . STERIOD INJECTION Left  01/12/2016   Procedure: STEROID INJECTION LEFT WRIST;  Surgeon: Roseanne Kaufman, MD;  Location: [facility];  Service: Orthopedics;  Laterality: Left;  . TOTAL THYROIDECTOMY  2003  . UPPER GI ENDOSCOPY  01/19/2015    Social History   Socioeconomic History  . Marital status: Married    Spouse name: Not on file  . Number of children: 0  . Years of education: 61  . Highest education level: Not on file  Occupational History  . 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 Financial resource strain: Not on file  . Food insecurity:    Worry: Not on file    Inability: Not on file  . Transportation needs:    Medical: Not on file    Non-medical: Not on file  Tobacco Use  . Smoking status: Former Smoker    Packs/day: 0.50    Years: 15.00    Pack years: 7.50    Types: Cigarettes    Last attempt to quit: 12/08/2016    Years since quitting: 1.0  . Smokeless tobacco: Never Used  . Tobacco comment: stopped   Substance and Sexual Activity  . Alcohol use: No    Alcohol/week: 0.0 oz    Comment: Has not had any alcohol since 07/2014 - before this date, she rarely drink.  . Drug use: No  .  Sexual activity: Never    Partners: Male  Lifestyle  . Physical activity:    Days per week: Not on file    Minutes per session: Not on file  . Stress: Not on file  Relationships  . Social connections:    Talks on phone: Not on file    Gets together: Not on file    Attends religious service: Not on file    Active member of club or organization: Not on file    Attends meetings of clubs or organizations: Not on file    Relationship status: Not on file  Other Topics Concern  . Not on file  Social History Narrative   RN - [employer]   Regular exercise: no   Caffeine use: 2 x daily   Right-handed.   Lives alone.    Family History  Problem Relation Age of Onset  . Heart disease Father   . Depression Father   . Cancer Brother        THROAT  . Depression Brother   . Anxiety disorder Brother   . Depression Sister   . Breast cancer Mother   . Hypertension Mother     Review of Systems  Eyes: Positive for photophobia.  Gastrointestinal: Positive for nausea and vomiting.  Neurological: Positive for dizziness, light-headedness and headaches. Negative for weakness and numbness.       Objective:   Vitals:   12/23/17 1534  BP: 110/70  Pulse: 85  Resp: 16  Temp: 98.6 F (37 C)  SpO2: 96%   BP Readings from Last 3 Encounters:  12/23/17 110/70  10/15/17 110/60  08/12/17 112/68   Wt Readings from Last 3 Encounters:  12/23/17 (!) 319 lb (144.7 kg)  10/15/17 (!) 323 lb 9.6 oz (146.8 kg)  08/12/17 (!) 321 lb (145.6 kg)   Body mass index is 45.16 kg/m.   Physical Exam  Constitutional: She is oriented to person, place, and time. She appears well-developed and well-nourished. She appears distressed (Mild distress related to severe migraine).  HENT:  Head: Normocephalic and atraumatic.  Neurological: She is alert and oriented to person, place, and  time. No sensory deficit. She exhibits normal muscle tone. Coordination normal.  Normal strength all extremities  Skin: Skin  is warm and dry. She is not diaphoretic.  Psychiatric: She has a normal mood and affect. Her behavior is normal.           Assessment & Plan:    See Problem List for Assessment and Plan of chronic medical problems.

## 2017-12-31 ENCOUNTER — Encounter: Payer: Self-pay | Admitting: Nurse Practitioner

## 2018-01-02 ENCOUNTER — Encounter: Payer: Self-pay | Admitting: Nurse Practitioner

## 2018-01-02 ENCOUNTER — Encounter (INDEPENDENT_AMBULATORY_CARE_PROVIDER_SITE_OTHER): Payer: Self-pay | Admitting: "Endocrinology

## 2018-01-02 ENCOUNTER — Ambulatory Visit (INDEPENDENT_AMBULATORY_CARE_PROVIDER_SITE_OTHER): Payer: 59 | Admitting: Nurse Practitioner

## 2018-01-02 VITALS — BP 118/72 | HR 84 | Temp 98.5°F | Resp 16 | Ht 70.47 in | Wt 315.0 lb

## 2018-01-02 DIAGNOSIS — I10 Essential (primary) hypertension: Secondary | ICD-10-CM

## 2018-01-02 DIAGNOSIS — Z0001 Encounter for general adult medical examination with abnormal findings: Secondary | ICD-10-CM

## 2018-01-02 DIAGNOSIS — E559 Vitamin D deficiency, unspecified: Secondary | ICD-10-CM | POA: Diagnosis not present

## 2018-01-02 DIAGNOSIS — G8929 Other chronic pain: Secondary | ICD-10-CM

## 2018-01-02 DIAGNOSIS — E7800 Pure hypercholesterolemia, unspecified: Secondary | ICD-10-CM | POA: Diagnosis not present

## 2018-01-02 DIAGNOSIS — Z23 Encounter for immunization: Secondary | ICD-10-CM

## 2018-01-02 DIAGNOSIS — F3181 Bipolar II disorder: Secondary | ICD-10-CM | POA: Diagnosis not present

## 2018-01-02 DIAGNOSIS — M545 Low back pain: Secondary | ICD-10-CM | POA: Diagnosis not present

## 2018-01-02 MED ORDER — LISINOPRIL-HYDROCHLOROTHIAZIDE 10-12.5 MG PO TABS: 1.0000 | ORAL_TABLET | Freq: Every morning | ORAL | 0 refills | Status: DC

## 2018-01-02 NOTE — Progress Notes (Signed)
Name: Tracy [name]   MRN: [id number]    DOB: 08/30/1965   Date:01/02/2018       Progress Note  Subjective  Chief Complaint  Chief Complaint  Patient presents with  . CPE    HPI  Tracy [name] is here today for her annual physical. She is followed routinely by endocrinology for hypertension, hypothyroidism, type 2 diabetes; [facility] for PCOS and routine womens [facility] care; Psychiatry for anxiety and depression; and Gastroenterology at [facility] for NASH, abnormal past colonscopy. 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   Hypertension: maintained on lisinopril-HCTZ 10-12.5 Reports daily medication compliance with no noted adverse effects BP Readings from Last 3 Encounters:  01/02/18 118/72  12/23/17 110/70  10/15/17 110/60   Obesity: Wt Readings from Last 3 Encounters:  01/02/18 (!) 315 lb (142.9 kg)  12/23/17 (!) 319 lb (144.7 kg)  10/15/17 (!) 323 lb 9.6 oz (146.8 kg)   BMI Readings from Last 3 Encounters:  01/02/18 44.60 kg/m  12/23/17 45.16 kg/m  10/15/17 45.81 kg/m    Alcohol: no Tobacco use: 1/4 pack a day, thinking of quitting, is looking into smoking cessation options offered by her employer  HIV: declines screening STD testing and prevention (chl/gon/syphilis): no concerns, declines treatment  Intimate partner violence: denies, feels safe  Vaccinations: TDAP today   Advanced Care Planning: A voluntary discussion about advance care planning including the explanation and discussion of advance directives.  Discussed  health care proxy and Living will, and the patient DOES NOT have a living will at present time. If patient does have living will, I have requested they bring this to the clinic to be scanned in to their chart.  Breast cancer: follows with GYN for routine mammograms, overdue Cervical cancer screening:  Follows with GYN for routine PAP, overdue  Lipids: hx HLD, maintained off medications due to NASH, was prescribed fenofibrate in the past but she was afraid to take it due to her liver condition,  Lab Results  Component Value Date   CHOL 198 08/19/2017   CHOL 192 05/26/2015   Lab Results  Component Value Date   HDL 38 08/19/2017   HDL 35 (L) 05/26/2015   Lab Results  Component Value Date   LDLCALC 124 08/19/2017   LDLCALC 129 05/26/2015   Lab Results  Component Value Date   TRIG 218 (A) 08/19/2017   TRIG 140 05/26/2015   Lab Results  Component Value Date   CHOLHDL 5.5 (H) 05/26/2015   No results found for: LDLDIRECT  Glucose:  Glucose, Bld  Date Value Ref Range Status  05/01/2017 88 65 - 99 mg/dL Final    Comment:    .            Fasting reference interval .   02/07/2017 129 (H) 65 - 99 mg/dL Final  01/31/2017 171 (H) 70 - 99 mg/dL Final   Glucose-Capillary  Date Value Ref Range Status  01/12/2016 143 (H) 65 - 99 mg/dL Final  01/12/2016 146 (H) 65 - 99 mg/dL Final  11/01/2014 125 (  H) 70 - 99 mg/dL Final    Skin cancer: no concerning lesions or moles, wears sunscreen  Colorectal cancer: overdue for colonoscopy    Aspirin: not taking ECG: not indicated   Patient Active Problem List   Diagnosis Date Noted  . Lower back pain 07/18/2017  . Hemorrhoids, internal 07/18/2017  . Trigeminal neuralgia 01/18/2016  . Bipolar disorder ([place]) 11/28/2015  . Borderline personality disorder ([place]) 11/28/2015  . Carpal tunnel syndrome 09/06/2015  . MDD (major depressive disorder) 06/06/2015  . Severe episode of recurrent major depressive disorder, without psychotic  features ([place])   . History of posttraumatic stress disorder (PTSD)   . GAD (generalized anxiety disorder)   . Suicidal ideation 06/05/2015  . Insulin resistance 02/09/2015  . 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 Acanthosis nigricans, acquired 02/09/2015  . Nonalcoholic fatty liver disease 02/09/2015  . Hepatic cirrhosis ([place]) 02/09/2015  . Female hirsutism 02/09/2015  . Oligomenorrhea 02/09/2015  . Infertility associated with anovulation 02/09/2015  . Morbid obesity ([place]) 10/31/2014  . Anxiety and depression 10/31/2014  . Obstructive sleep apnea 06/27/2014  . Diabetes mellitus, type 2 ([place]) 06/10/2013  . 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 Cystic teratoma   . PCOS (polycystic ovarian syndrome)   . Infertility, female   . TOBACCO USE, QUIT 02/08/2010  . HYPOTHYROIDISM, POSTSURGICAL 10/02/2007  . POLYCYSTIC OVARIAN DISEASE 10/02/2007  . Elevated cholesterol 10/02/2007  . DEPRESSION 10/02/2007  . Migraine 10/02/2007  . Essential hypertension 10/02/2007  . RENAL CALCULUS, HX OF 10/02/2007    Past Surgical History:  Procedure Laterality Date  . ACHILLES TENDON SURGERY Left 2007  . APPENDECTOMY    . CARPAL TUNNEL RELEASE Right 01/12/2016   Procedure: RIGHT CARPAL TUNNEL RELEASE;  Surgeon: Roseanne Kaufman, MD;  Location: [place];  Service: Orthopedics;  Laterality: Right;  . COLONOSCOPY WITH PROPOFOL N/A 04/20/2014   Procedure: COLONOSCOPY WITH PROPOFOL;  Surgeon: Arta Silence, MD;  Location: [facility];  Service: Endoscopy;  Laterality: N/A;  . ESOPHAGOGASTRODUODENOSCOPY (EGD) WITH PROPOFOL N/A 04/20/2014   Procedure: ESOPHAGOGASTRODUODENOSCOPY (EGD) WITH PROPOFOL;  Surgeon: Arta Silence, MD;  Location: [facility];  Service: Endoscopy;  Laterality: N/A;  . EXCISION HAGLUND'S DEFORMITY WITH ACHILLES TENDON REPAIR Right 02/25/2013   Procedure: RIGHT ACHILLES DEBRIDEMENT AND RECONSTRUCTION;  HAGLUND'S EXCISION; GASTROC RECESSION AND FLEXOR HALLUCIS LONGUS TRANSFER;   Surgeon: Wylene Simmer, MD;  Location: [place];  Service: Orthopedics;  Laterality: Right;  . LUMBAR LAMINECTOMY     X 3  . neck fusion    . OVARIAN CYST SURGERY Left 1998  . PILONIDAL CYST EXCISION  1994  . RIGHT OOPHORECTOMY    . STERIOD INJECTION Left 01/12/2016   Procedure: STEROID INJECTION LEFT WRIST;  Surgeon: Roseanne Kaufman, MD;  Location: [facility];  Service: Orthopedics;  Laterality: Left;  . TOTAL THYROIDECTOMY  2003  . UPPER GI ENDOSCOPY  01/19/2015    Family History  Problem Relation Age of Onset  . Heart disease Father   . Depression Father   . Cancer Brother        THROAT  . Depression Brother   . Anxiety disorder Brother   . Depression Sister   . Breast cancer Mother   . Hypertension Mother     Social History   Socioeconomic History  . Marital status: Married    Spouse name: Not on file  . Number of children: 0  . Years of education: 81  . Highest education level: Not on file  Occupational History  .  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 Financial resource strain: Not on file  . Food insecurity:    Worry: Not on file    Inability: Not on file  . Transportation needs:    Medical: Not on file    Non-medical: Not on file  Tobacco Use  . Smoking status: Former Smoker    Packs/day: 0.50    Years: 15.00    Pack years: 7.50    Types: Cigarettes    Last attempt to quit: 12/08/2016    Years since quitting: 1.0  . Smokeless tobacco: Never Used  . Tobacco comment: stopped   Substance and Sexual Activity  . Alcohol use: No    Alcohol/week: 0.0 oz    Comment: Has not had any alcohol since 07/2014 - before this date, she rarely drink.  . Drug use: No  . Sexual activity: Never    Partners: Male  Lifestyle  . Physical activity:    Days per week: Not on file    Minutes per session: Not on file  . Stress: Not on file  Relationships  . Social connections:    Talks on phone: Not on file    Gets together: Not on file     Attends religious service: Not on file    Active member of club or organization: Not on file    Attends meetings of clubs or organizations: Not on file    Relationship status: Not on file  . Intimate partner violence:    Fear of current or ex partner: Not on file    Emotionally abused: Not on file    Physically abused: Not on file    Forced sexual activity: Not on file  Other Topics Concern  . Not on file  Social History Narrative   RN - [employer]   Regular exercise: no   Caffeine use: 2 x daily   Right-handed.   Lives alone.     Current Outpatient Medications:  .  Alum & Mag Hydroxide-Simeth (GI COCKTAIL) SUSP suspension, Take 30 mLs by mouth 2 (two) times daily as needed for indigestion. Shake well., Disp: 300 mL, Rfl: 0 .  Blood Glucose Monitoring Suppl (FREESTYLE LITE) DEVI, Use to check glucose, Disp: 2 each, Rfl: 4 .  Brexpiprazole (REXULTI) 2 MG TABS, Take 2 mg by mouth daily. , Disp: , Rfl:  .  clonazePAM (KLONOPIN) 1 MG tablet, Take 0.5-1 mg by mouth 3 (three) times daily. Take 0.24m twice daily and then take 1 mg at bedtime, Disp: , Rfl: 5 .  Continuous Blood Gluc Receiver (FREESTYLE LIBRE READER) DEVI, 1 kit by Does not apply route every 3 (three) months., Disp: 1 Device, Rfl: 1 .  Continuous Blood Gluc Sensor (FREESTYLE LIBRE SENSOR SYSTEM) MISC, Use 1 sensor every 14 days, Disp: 9 each, Rfl: 1 .  cyclobenzaprine (FLEXERIL) 10 MG tablet, Take 10 mg by mouth daily as needed., Disp: , Rfl:  .  furosemide (LASIX) 20 MG tablet, Take 1 tablet (20 mg total) by mouth daily., Disp: 30 tablet, Rfl: 6 .  glucose blood (FREESTYLE LITE) test strip, Use to check glucose 3x daily, Disp: 300 each, Rfl: 4 .  Insulin Glargine (LANTUS SOLOSTAR) 100 UNIT/ML Solostar Pen, Take 43 units once daily. Dispense one 5-pack of lantus solostar pens., Disp: 15 mL, Rfl: 12 .  lamoTRIgine (LAMICTAL) 150 MG tablet, Take 300 mg by mouth daily. , Disp: , Rfl:  .  lansoprazole (PREVACID) 30 MG capsule,  Take once daily., Disp:  30 capsule, Rfl: 6 .  lisinopril-hydrochlorothiazide (PRINZIDE,ZESTORETIC) 10-12.5 MG tablet, Take 1 tablet by mouth every morning., Disp: 90 tablet, Rfl: 0 .  promethazine (PHENERGAN) 25 MG tablet, Take 1 tablet (25 mg total) by mouth every 8 (eight) hours as needed for nausea or vomiting., Disp: 20 tablet, Rfl: 5 .  ranitidine (ZANTAC) 150 MG tablet, Take 1 tablet (150 mg total) by mouth 2 (two) times daily., Disp: 60 tablet, Rfl: 11 .  rizatriptan (MAXALT) 10 MG tablet, Take 1 tablet (10 mg total) by mouth as needed for migraine. May repeat in 2 hours if needed, Disp: 10 tablet, Rfl: 0 .  SYNTHROID 200 MCG tablet, Take 1.5 of brand Synthroid, 200 mcg tablets each morning., Disp: 135 tablet, Rfl: 4 .  traZODone (DESYREL) 50 MG tablet, Take 1 tablet (50 mg total) by mouth at bedtime and may repeat dose one time if needed., Disp: 30 tablet, Rfl: 0  Allergies  Allergen Reactions  . Sulfa Antibiotics Itching, Swelling and Rash  . Bupropion Rash  . Bupropion Hcl Itching and Rash  . Codeine Itching  . Sulfamethoxazole-Trimethoprim Rash     ROS  Constitutional: Negative for fever or weight change.  Respiratory: Negative for cough and shortness of breath.   Cardiovascular: Negative for chest pain or palpitations.  Gastrointestinal: Negative for abdominal pain, no bowel changes.  Musculoskeletal: Negative for gait problem. Positive for back pain, joint pain. Skin: Negative for rash.  Neurological: Negative for dizziness or headache.  No other specific complaints in a complete review of systems (except as listed in HPI above).  Joint pain- This is a not new problem She has chronic left hip and lower back pain, which she attributes to her weight and sedentary lifestyle She wants to be more active but it is hard to find time with work and also because of the pain She denies weakness, recent injury  Objective  Vitals:   01/02/18 1603  BP: 118/72  Pulse: 84  Resp:  16  Temp: 98.5 F (36.9 C)  TempSrc: Oral  SpO2: 97%  Weight: (!) 315 lb (142.9 kg)  Height: 5' 10.47" (1.79 m)    Body mass index is 44.6 kg/m.  Physical Exam Vital signs reviewed Constitutional: Patient appears well-developed and well-nourished. No distress.  HENT: Head: Normocephalic and atraumatic. Ears: B TMs ok, no erythema or effusion; Nose: Nose normal. Mouth/Throat: Oropharynx is clear and moist. No oropharyngeal exudate.  Eyes: Conjunctivae and EOM are normal. Pupils are equal, round, and reactive to light. No scleral icterus.  Neck: Normal range of motion. Neck supple. No cervical adenopathy. No thyromegaly present.  Cardiovascular: Normal rate, regular rhythm and normal heart sounds.  No murmur heard. No BLE edema. distal pulses intact Pulmonary/Chest: Effort normal and breath sounds normal. No respiratory distress. Abdominal: Soft, rotund. Bowel sounds are normal, no distension. There is no tenderness. no masses Breast: defd to GYN FEMALE GENITALIA:  defd to GYN Musculoskeletal: Normal range of motion, No gross deformities Neurological: She is alert and oriented to person, place, and time. No cranial nerve deficit. Coordination, balance, strength, speech and gait are normal.  Skin: Skin is warm and dry. No rash noted. No erythema.  Psychiatric: Patient has a normal mood and affect. behavior is normal. Judgment and thought content normal.   Assessment & Plan RTC in 1 year for CPE Labwork done by endocrinology provider on 08/19/17: CBC w/diff, vitamin D, BMET, lipid panel, hepatic function panel, vitamin b12 with the following findings: low vitamin D  level, elevated triglycerides and LDL cholesterol, elevated LFTS

## 2018-01-02 NOTE — Patient Instructions (Addendum)
Please schedule your follow up visits with your liver doctor, gynecologist, and gastroenterologist.  Please schedule a follow up appointment for further evaluation here with Dr Tamala Julian or Dr Raeford Razor, our sports medicine providers for your hip pain.  I will plan to see you back in 1 year, or sooner if you need me.  Health Maintenance, Female Adopting a healthy lifestyle and getting preventive care can go a long way to promote health and wellness. Talk with your health care provider about what schedule of regular examinations is right for you. This is a good chance for you to check in with your provider about disease prevention and staying healthy. In between checkups, there are plenty of things you can do on your own. Experts have done a lot of research about which lifestyle changes and preventive measures are most likely to keep you healthy. Ask your health care provider for more information. Weight and diet Eat a healthy diet  Be sure to include plenty of vegetables, fruits, low-fat dairy products, and lean protein.  Do not eat a lot of foods high in solid fats, added sugars, or salt.  Get regular exercise. This is one of the most important things you can do for your health. ? Most adults should exercise for at least 150 minutes each week. The exercise should increase your heart rate and make you sweat (moderate-intensity exercise). ? Most adults should also do strengthening exercises at least twice a week. This is in addition to the moderate-intensity exercise.  Maintain a healthy weight  Body mass index (BMI) is a measurement that can be used to identify possible weight problems. It estimates body fat based on height and weight. Your health care provider can help determine your BMI and help you achieve or maintain a healthy weight.  For females 51 years of age and older: ? A BMI below 18.5 is considered underweight. ? A BMI of 18.5 to 24.9 is normal. ? A BMI of 25 to 29.9 is considered  overweight. ? A BMI of 30 and above is considered obese.  Watch levels of cholesterol and blood lipids  You should start having your blood tested for lipids and cholesterol at 50 years of age, then have this test every 5 years.  You may need to have your cholesterol levels checked more often if: ? Your lipid or cholesterol levels are high. ? You are older than 50 years of age. ? You are at high risk for heart disease.  Cancer screening Lung Cancer  Lung cancer screening is recommended for adults 2-76 years old who are at high risk for lung cancer because of a history of smoking.  A yearly low-dose CT scan of the lungs is recommended for people who: ? Currently smoke. ? Have quit within the past 15 years. ? Have at least a 30-pack-year history of smoking. A pack year is smoking an average of one pack of cigarettes a day for 1 year.  Yearly screening should continue until it has been 15 years since you quit.  Yearly screening should stop if you develop a health problem that would prevent you from having lung cancer treatment.  Breast Cancer  Practice breast self-awareness. This means understanding how your breasts normally appear and feel.  It also means doing regular breast self-exams. Let your health care provider know about any changes, no matter how small.  If you are in your 20s or 30s, you should have a clinical breast exam (CBE) by a health care provider every  1-3 years as part of a regular health exam.  If you are 23 or older, have a CBE every year. Also consider having a breast X-ray (mammogram) every year.  If you have a family history of breast cancer, talk to your health care provider about genetic screening.  If you are at high risk for breast cancer, talk to your health care provider about having an MRI and a mammogram every year.  Breast cancer gene (BRCA) assessment is recommended for women who have family members with BRCA-related cancers. BRCA-related cancers  include: ? Breast. ? Ovarian. ? Tubal. ? Peritoneal cancers.  Results of the assessment will determine the need for genetic counseling and BRCA1 and BRCA2 testing.  Cervical Cancer Your health care provider may recommend that you be screened regularly for cancer of the pelvic organs (ovaries, uterus, and vagina). This screening involves a pelvic examination, including checking for microscopic changes to the surface of your cervix (Pap test). You may be encouraged to have this screening done every 3 years, beginning at age 71.  For women ages 51-65, health care providers may recommend pelvic exams and Pap testing every 3 years, or they may recommend the Pap and pelvic exam, combined with testing for human papilloma virus (HPV), every 5 years. Some types of HPV increase your risk of cervical cancer. Testing for HPV may also be done on women of any age with unclear Pap test results.  Other health care providers may not recommend any screening for nonpregnant women who are considered low risk for pelvic cancer and who do not have symptoms. Ask your health care provider if a screening pelvic exam is right for you.  If you have had past treatment for cervical cancer or a condition that could lead to cancer, you need Pap tests and screening for cancer for at least 20 years after your treatment. If Pap tests have been discontinued, your risk factors (such as having a new sexual partner) need to be reassessed to determine if screening should resume. Some women have medical problems that increase the chance of getting cervical cancer. In these cases, your health care provider may recommend more frequent screening and Pap tests.  Colorectal Cancer  This type of cancer can be detected and often prevented.  Routine colorectal cancer screening usually begins at 50 years of age and continues through 50 years of age.  Your health care provider may recommend screening at an earlier age if you have risk factors  for colon cancer.  Your health care provider may also recommend using home test kits to check for hidden blood in the stool.  A small camera at the end of a tube can be used to examine your colon directly (sigmoidoscopy or colonoscopy). This is done to check for the earliest forms of colorectal cancer.  Routine screening usually begins at age 30.  Direct examination of the colon should be repeated every 5-10 years through 50 years of age. However, you may need to be screened more often if early forms of precancerous polyps or small growths are found.  Skin Cancer  Check your skin from head to toe regularly.  Tell your health care provider about any new moles or changes in moles, especially if there is a change in a mole's shape or color.  Also tell your health care provider if you have a mole that is larger than the size of a pencil eraser.  Always use sunscreen. Apply sunscreen liberally and repeatedly throughout the day.  Protect  yourself by wearing long sleeves, pants, a wide-brimmed hat, and sunglasses whenever you are outside.  Heart disease, diabetes, and high blood pressure  High blood pressure causes heart disease and increases the risk of stroke. High blood pressure is more likely to develop in: ? People who have blood pressure in the high end of the normal range (130-139/85-89 mm Hg). ? People who are overweight or obese. ? People who are African American.  If you are 76-29 years of age, have your blood pressure checked every 3-5 years. If you are 55 years of age or older, have your blood pressure checked every year. You should have your blood pressure measured twice-once when you are at a hospital or clinic, and once when you are not at a hospital or clinic. Record the average of the two measurements. To check your blood pressure when you are not at a hospital or clinic, you can use: ? An automated blood pressure machine at a pharmacy. ? A home blood pressure monitor.  If  you are between 64 years and 74 years old, ask your health care provider if you should take aspirin to prevent strokes.  Have regular diabetes screenings. This involves taking a blood sample to check your fasting blood sugar level. ? If you are at a normal weight and have a low risk for diabetes, have this test once every three years after 50 years of age. ? If you are overweight and have a high risk for diabetes, consider being tested at a younger age or more often. Preventing infection Hepatitis B  If you have a higher risk for hepatitis B, you should be screened for this virus. You are considered at high risk for hepatitis B if: ? You were born in a country where hepatitis B is common. Ask your health care provider which countries are considered high risk. ? Your parents were born in a high-risk country, and you have not been immunized against hepatitis B (hepatitis B vaccine). ? You have HIV or AIDS. ? You use needles to inject street drugs. ? You live with someone who has hepatitis B. ? You have had sex with someone who has hepatitis B. ? You get hemodialysis treatment. ? You take certain medicines for conditions, including cancer, organ transplantation, and autoimmune conditions.  Hepatitis C  Blood testing is recommended for: ? Everyone born from [birth year] through 1963. ? Anyone with known risk factors for hepatitis C.  Sexually transmitted infections (STIs)  You should be screened for sexually transmitted infections (STIs) including gonorrhea and chlamydia if: ? You are sexually active and are younger than 50 years of age. ? You are older than 50 years of age and your health care provider tells you that you are at risk for this type of infection. ? Your sexual activity has changed since you were last screened and you are at an increased risk for chlamydia or gonorrhea. Ask your health care provider if you are at risk.  If you do not have HIV, but are at risk, it may be recommended  that you take a prescription medicine daily to prevent HIV infection. This is called pre-exposure prophylaxis (PrEP). You are considered at risk if: ? You are sexually active and do not regularly use condoms or know the HIV status of your partner(s). ? You take drugs by injection. ? You are sexually active with a partner who has HIV.  Talk with your health care provider about whether you are at high risk of being  infected with HIV. If you choose to begin PrEP, you should first be tested for HIV. You should then be tested every 3 months for as long as you are taking PrEP. Pregnancy  If you are premenopausal and you may become pregnant, ask your health care provider about preconception counseling.  If you may become pregnant, take 400 to 800 micrograms (mcg) of folic acid every day.  If you want to prevent pregnancy, talk to your health care provider about birth control (contraception). Osteoporosis and menopause  Osteoporosis is a disease in which the bones lose minerals and strength with aging. This can result in serious bone fractures. Your risk for osteoporosis can be identified using a bone density scan.  If you are 68 years of age or older, or if you are at risk for osteoporosis and fractures, ask your health care provider if you should be screened.  Ask your health care provider whether you should take a calcium or vitamin D supplement to lower your risk for osteoporosis.  Menopause may have certain physical symptoms and risks.  Hormone replacement therapy may reduce some of these symptoms and risks. Talk to your health care provider about whether hormone replacement therapy is right for you. Follow these instructions at home:  Schedule regular health, dental, and eye exams.  Stay current with your immunizations.  Do not use any tobacco products including cigarettes, chewing tobacco, or electronic cigarettes.  If you are pregnant, do not drink alcohol.  If you are  breastfeeding, limit how much and how often you drink alcohol.  Limit alcohol intake to no more than 1 drink per day for nonpregnant women. One drink equals 12 ounces of beer, 5 ounces of wine, or 1 ounces of hard liquor.  Do not use street drugs.  Do not share needles.  Ask your health care provider for help if you need support or information about quitting drugs.  Tell your health care provider if you often feel depressed.  Tell your health care provider if you have ever been abused or do not feel safe at home. This information is not intended to replace advice given to you by your health care provider. Make sure you discuss any questions you have with your health care provider. Document Released: 02/04/2011 Document Revised: 12/28/2015 Document Reviewed: 04/25/2015 Elsevier Interactive Patient Education  [employer].

## 2018-01-04 ENCOUNTER — Encounter: Payer: Self-pay | Admitting: Nurse Practitioner

## 2018-01-04 DIAGNOSIS — Z0001 Encounter for general adult medical examination with abnormal findings: Secondary | ICD-10-CM | POA: Insufficient documentation

## 2018-01-04 DIAGNOSIS — E559 Vitamin D deficiency, unspecified: Secondary | ICD-10-CM | POA: Insufficient documentation

## 2018-01-04 NOTE — Assessment & Plan Note (Signed)
Being followed by endocrinology per patient Continue routine F/U with endocrinology

## 2018-01-04 NOTE — Assessment & Plan Note (Signed)
Due to Hx NASH and patient hesitancy to initiate medications we will hold on lipid lowering agents for now Discussed the role of healthy diet, exercise, and smoking cessation in the management of HLD and overall risk reduction Encouraged to schedule routine follow up with GI provider for NASH F/U

## 2018-01-04 NOTE — Assessment & Plan Note (Addendum)
-  USPSTF grade A and B recommendations reviewed with patient; age-appropriate recommendations, preventive care, screening tests, etc discussed and encouraged; healthy living, sunscreen use and smoking cessation encouraged; see AVS for patient education given to patient. Advanced directives packet given to patient. -Discussed importance of 150 minutes of physical activity weekly, eat 6 servings of fruit/vegetables daily and drink plenty of water and avoid sweet beverages.  -Follow up and care instructions discussed and provided in AVS.  -Reviewed Health Maintenance:  Continue routine endocrinology F/U for DM maintenance Encouraged to schedule routine GYN F/U for mammo, PAP Encouraged to schedule GI F/U for colonoscopy Declines HIV screening Need for Tdap vaccination- Tdap vaccine greater than or equal to 7yo IM

## 2018-01-04 NOTE — Assessment & Plan Note (Signed)
Referral to sports medicine for further evaluation and management

## 2018-01-04 NOTE — Assessment & Plan Note (Signed)
Stable, continue current meds 

## 2018-01-11 ENCOUNTER — Other Ambulatory Visit (INDEPENDENT_AMBULATORY_CARE_PROVIDER_SITE_OTHER): Payer: Self-pay | Admitting: "Endocrinology

## 2018-01-11 DIAGNOSIS — Z794 Long term (current) use of insulin: Principal | ICD-10-CM

## 2018-01-11 DIAGNOSIS — E1110 Type 2 diabetes mellitus with ketoacidosis without coma: Secondary | ICD-10-CM

## 2018-01-15 DIAGNOSIS — F3181 Bipolar II disorder: Secondary | ICD-10-CM | POA: Diagnosis not present

## 2018-01-19 ENCOUNTER — Ambulatory Visit: Payer: Self-pay | Admitting: Family Medicine

## 2018-01-19 ENCOUNTER — Emergency Department (HOSPITAL_BASED_OUTPATIENT_CLINIC_OR_DEPARTMENT_OTHER): Payer: 59

## 2018-01-19 ENCOUNTER — Emergency Department (HOSPITAL_COMMUNITY): Payer: 59

## 2018-01-19 ENCOUNTER — Emergency Department (HOSPITAL_COMMUNITY)
Admission: EM | Admit: 2018-01-19 | Discharge: 2018-01-19 | Disposition: A | Discharge status: Home / Self Care | Payer: 59 | Attending: Emergency Medicine | Admitting: Emergency Medicine

## 2018-01-19 DIAGNOSIS — M25562 Pain in left knee: Secondary | ICD-10-CM | POA: Diagnosis not present

## 2018-01-19 DIAGNOSIS — M79609 Pain in unspecified limb: Secondary | ICD-10-CM | POA: Diagnosis not present

## 2018-01-19 DIAGNOSIS — I10 Essential (primary) hypertension: Secondary | ICD-10-CM | POA: Diagnosis not present

## 2018-01-19 DIAGNOSIS — M2550 Pain in unspecified joint: Secondary | ICD-10-CM | POA: Insufficient documentation

## 2018-01-19 DIAGNOSIS — Z87891 Personal history of nicotine dependence: Secondary | ICD-10-CM | POA: Diagnosis not present

## 2018-01-19 DIAGNOSIS — E119 Type 2 diabetes mellitus without complications: Secondary | ICD-10-CM | POA: Insufficient documentation

## 2018-01-19 DIAGNOSIS — Z794 Long term (current) use of insulin: Secondary | ICD-10-CM | POA: Insufficient documentation

## 2018-01-19 DIAGNOSIS — Z79899 Other long term (current) drug therapy: Secondary | ICD-10-CM | POA: Insufficient documentation

## 2018-01-19 DIAGNOSIS — E039 Hypothyroidism, unspecified: Secondary | ICD-10-CM | POA: Diagnosis not present

## 2018-01-19 DIAGNOSIS — S8992XA Unspecified injury of left lower leg, initial encounter: Secondary | ICD-10-CM | POA: Diagnosis not present

## 2018-01-19 MED ORDER — MELOXICAM 15 MG PO TABS: 15.0000 mg | ORAL_TABLET | Freq: Every day | ORAL | 0 refills | Status: DC

## 2018-01-19 NOTE — ED Notes (Signed)
Pt assisted into gown by this RN so ready when vascular comes.

## 2018-01-19 NOTE — ED Notes (Signed)
Bed: WTR9 Expected date:  Expected time:  Means of arrival:  Comments: environmental

## 2018-01-19 NOTE — Discharge Instructions (Signed)
Please read and follow all provided instructions.  You have been seen today for left knee pain  Tests performed today include: An x-ray of the affected area - does NOT show any broken bones or dislocations. This did show degenerative changes.  Ultrasound of your lower leg - the preliminary reading did not show evidence of a blood clot in your leg.  Vital signs. See below for your results today.   Home care instructions: -- *PRICE in the first 24-48 hours after injury: Protect (with brace, splint, sling), if given by your provider Rest Ice- Do not apply ice pack directly to your skin, place towel or similar between your skin and ice/ice pack. Apply ice for 20 min, then remove for 40 min while awake Compression- Wear brace, elastic bandage, splint as directed by your provider Elevate affected extremity above the level of your heart when not walking around for the first 24-48 hours   Take Mobic with food as prescribed.   Follow-up instructions: Please follow-up with your primary care provider and the provided orthopedic physician (bone specialist).   Return instructions:  Please return if your toes or feet are numb or tingling, appear gray or blue, or you have severe pain (also elevate the leg and loosen splint or wrap if you were given one) Please return to the Emergency Department if you experience worsening symptoms.  Please return if you have any other emergent concerns. Additional Information:  Your vital signs today were: BP 120/72 (BP Location: Left Arm)    Pulse 87    Temp 98.1 F (36.7 C) (Oral)    Resp 16    SpO2 95%  If your blood pressure (BP) was elevated above 135/85 this visit, please have this repeated by your doctor within one month. ---------------

## 2018-01-19 NOTE — ED Triage Notes (Signed)
Pt states her left knee popped when stepping down from a stool a week ago. Pt states the pain became worse while at work today. Pt states pain is worse behind left knee.

## 2018-01-19 NOTE — Progress Notes (Signed)
VASCULAR LAB PRELIMINARY  PRELIMINARY  PRELIMINARY  PRELIMINARY  Left lower extremity venous duplex completed.    Preliminary report:  There is no obvious evidence of DVT or SVT noted in the visualized veins of the left lower extremity.   Gianfranco Araki, RVT 01/19/2018, 7:39 PM

## 2018-01-19 NOTE — ED Provider Notes (Signed)
[facility] DEPT Provider Note   CSN: [id number] Arrival date & time: 01/19/18  1321     History   Chief Complaint Chief Complaint  Patient presents with  . Knee Pain    left    HPI Tracy [name] is a 52 y.o. female with a history of arthritis, diabetes as well as others as listed below who presents emergency department today for left knee pain.  Patient states that last Wednesday he was on a ottoman attempting to change a light bulb when she stepped down with her left leg and felt a pop in her left knee.  She reports that she had no pain initially but the next day when waking up she had significant pain anytime she ambulated.  She reports the pain is a sharp sensation that is primarily in the posterior knee, more on the left side.  She reports she has been taking ibuprofen with the last dose at 3 PM today with mild to moderate relief.  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  The patient notes she is also tried compression and elevation with mild relief.  She denies prior history of knee injury or surgery.  She denies any fever, joint swelling, overlying erythema, difficulty with range of motion, numbness/tingling/weakness of the extremity, or other trauma.  HPI  Past Medical History:  Diagnosis Date  . Anxiety   . Arthritis    back, knees, right elbow  . Bipolar disorder ([place])   . Cirrhosis ([place])   . Dental crowns present   . Depression   . Diabetes mellitus without complication ([place])   . GERD (gastroesophageal reflux disease)   . High cholesterol    no current med.  Marland Kitchen History of kidney stones   . History of migraine headaches   . Hypertension    under control with meds., has been on med. x 4 yr.  . Hypothyroidism   . NASH (nonalcoholic steatohepatitis)   . PCOS (polycystic ovarian syndrome)   . PONV (postoperative nausea and vomiting)    also hx. of emergence delirium 2007  .  Sleep apnea    uses CPAP nightly  . Trigeminal neuralgia     Patient Active Problem List   Diagnosis Date Noted  . Encounter for general adult medical examination with abnormal findings 01/04/2018  . Vitamin D deficiency 01/04/2018  . Lower back pain 07/18/2017  . Hemorrhoids, internal 07/18/2017  . Trigeminal neuralgia 01/18/2016  . Bipolar disorder ([place]) 11/28/2015  . Borderline personality disorder ([place]) 11/28/2015  . Carpal tunnel syndrome 09/06/2015  . MDD (major depressive disorder) 06/06/2015  . Severe episode of recurrent major depressive disorder, without psychotic features ([place])   . History of posttraumatic stress disorder (PTSD)   . GAD (generalized anxiety disorder)   . Insulin resistance 02/09/2015  . 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 Acanthosis nigricans, acquired 02/09/2015  . Nonalcoholic fatty liver disease 02/09/2015  . Hepatic cirrhosis ([place]) 02/09/2015  . Female hirsutism 02/09/2015  . Oligomenorrhea 02/09/2015  . Infertility associated with anovulation 02/09/2015  . Morbid obesity ([place]) 10/31/2014  . Anxiety and depression 10/31/2014  . Obstructive sleep apnea 06/27/2014  . Diabetes mellitus, type 2 ([place]) 06/10/2013  . 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[place]) 01/01/2012  . Cystic teratoma   . PCOS (polycystic ovarian syndrome)   . Infertility, female   . TOBACCO USE, QUIT 02/08/2010  . HYPOTHYROIDISM, POSTSURGICAL 10/02/2007  . POLYCYSTIC OVARIAN DISEASE 10/02/2007  . Elevated cholesterol  10/02/2007  . DEPRESSION 10/02/2007  . Migraine 10/02/2007  . Essential hypertension 10/02/2007  . RENAL CALCULUS, HX OF 10/02/2007    Past Surgical History:  Procedure Laterality Date  . ACHILLES TENDON SURGERY Left 2007  . APPENDECTOMY    . CARPAL TUNNEL RELEASE Right 01/12/2016   Procedure: RIGHT CARPAL TUNNEL RELEASE;  Surgeon: Roseanne Kaufman, MD;  Location: [facility];  Service: Orthopedics;  Laterality: Right;  . COLONOSCOPY WITH PROPOFOL N/A 04/20/2014    Procedure: COLONOSCOPY WITH PROPOFOL;  Surgeon: Arta Silence, MD;  [facility];  Service: Endoscopy;  Laterality: N/A;  . ESOPHAGOGASTRODUODENOSCOPY (EGD) WITH PROPOFOL N/A 04/20/2014   Procedure: ESOPHAGOGASTRODUODENOSCOPY (EGD) WITH PROPOFOL;  Surgeon: Arta Silence, MD;  [facility];  Service: Endoscopy;  Laterality: N/A;  . EXCISION HAGLUND'S DEFORMITY WITH ACHILLES TENDON REPAIR Right 02/25/2013   Procedure: RIGHT ACHILLES DEBRIDEMENT AND RECONSTRUCTION;  HAGLUND'S EXCISION; GASTROC RECESSION AND FLEXOR HALLUCIS LONGUS TRANSFER;  Surgeon: Wylene Simmer, MD;  Location: [facility];  Service: Orthopedics;  Laterality: Right;  . LUMBAR LAMINECTOMY     X 3  . neck fusion    . OVARIAN CYST SURGERY Left 1998  . PILONIDAL CYST EXCISION  1994  . RIGHT OOPHORECTOMY    . STERIOD INJECTION Left 01/12/2016   Procedure: STEROID INJECTION LEFT WRIST;  Surgeon: Roseanne Kaufman, MD;  Location: [facility];  Service: Orthopedics;  Laterality: Left;  . TOTAL THYROIDECTOMY  2003  . UPPER GI ENDOSCOPY  01/19/2015     OB History    Gravida  0   Para      Term      Preterm      AB      Living        SAB      TAB      Ectopic      Multiple      Live Births               Home Medications    Prior to Admission medications   Medication Sig Start Date End Date Taking? Authorizing Provider  Alum & Mag Hydroxide-Simeth (GI COCKTAIL) SUSP suspension Take 30 mLs by mouth 2 (two) times daily as needed for indigestion. Shake well. 02/07/17   Clayton Bibles, PA-C  Blood Glucose Monitoring Suppl (FREESTYLE LITE) DEVI Use to check glucose 12/24/16   [name], MD  Brexpiprazole (REXULTI) 2 MG TABS Take 2 mg by mouth daily.     [provider]  clonazePAM (KLONOPIN) 1 MG tablet Take 0.5-1 mg by mouth 3 (three) times daily. Take 0.25m twice daily and then take 1 mg at bedtime 02/03/17   [provider]  Continuous Blood Gluc  Receiver (FREESTYLE LIBRE READER) DEVI 1 kit by Does not apply route every 3 (three) months. 08/12/17   BSherrlyn Hock MD  Continuous Blood Gluc Sensor (FMcGrath MISC Use 1 sensor every 14 days 08/12/17   BSherrlyn Hock MD  cyclobenzaprine (FLEXERIL) 10 MG tablet Take 10 mg by mouth daily as needed.    [provider]  furosemide (LASIX) 20 MG tablet Take 1 tablet (20 mg total) by mouth daily. 11/11/17   BSherrlyn Hock MD  glucose blood (FREESTYLE LITE) test strip USE TO CHECK GLUCOSE 3X DAILY 01/12/18   BSherrlyn Hock MD  Insulin Glargine (LANTUS SOLOSTAR) 100 UNIT/ML Solostar Pen Take 43 units once daily. Dispense one 5-pack of lantus solostar pens. 05/06/17 05/06/18  [name], MD  lamoTRIgine (LAMICTAL) 150 MG tablet Take 300 mg by mouth daily.     [provider]  lansoprazole (PREVACID) 30 MG capsule Take once daily. 04/02/17 04/02/18  [name], MD  lisinopril-hydrochlorothiazide (PRINZIDE,ZESTORETIC) 10-12.5 MG tablet Take 1 tablet by mouth every morning. 01/02/18   Lance Sell, NP  promethazine (PHENERGAN) 25 MG tablet Take 1 tablet (25 mg total) by mouth every 8 (eight) hours as needed for nausea or vomiting. 12/23/17   Binnie Rail, MD  ranitidine (ZANTAC) 150 MG tablet Take 1 tablet (150 mg total) by mouth 2 (two) times daily. 04/02/17 04/02/18  [name], MD  rizatriptan (MAXALT) 10 MG tablet Take 1 tablet (10 mg total) by mouth as needed for migraine. May repeat in 2 hours if needed 12/23/17   Binnie Rail, MD  SYNTHROID 200 MCG tablet Take 1.5 of brand Synthroid, 200 mcg tablets each morning. 07/07/17 07/03/18  [name], MD  traZODone (DESYREL) 50 MG tablet Take 1 tablet (50 mg total) by mouth at bedtime and may repeat dose one time if needed. 06/06/15   Kerrie Buffalo, NP    Family History Family History  Problem Relation Age of Onset  . Heart disease Father   . Depression Father   .  Cancer Brother        THROAT  . Depression Brother   . Anxiety disorder Brother   . Depression Sister   . Breast cancer Mother   . Hypertension Mother     Social History Social History   Tobacco Use  . Smoking status: Former Smoker    Packs/day: 0.50    Years: 15.00    Pack years: 7.50    Types: Cigarettes    Last attempt to quit: 12/08/2016    Years since quitting: 1.1  . Smokeless tobacco: Never Used  . Tobacco comment: stopped   Substance Use Topics  . Alcohol use: No    Alcohol/week: 0.0 oz    Comment: Has not had any alcohol since 07/2014 - before this date, she rarely drink.  . Drug use: No     Allergies   Sulfa antibiotics; Bupropion; Bupropion hcl; Codeine; and Sulfamethoxazole-trimethoprim   Review of Systems Review of Systems  Constitutional: Negative for fever.  Musculoskeletal: Positive for arthralgias. Negative for joint swelling.  Skin: Negative for color change and wound.  Neurological: Negative for weakness and numbness.  All other systems reviewed and are negative.    Physical Exam Updated Vital Signs BP 120/72 (BP Location: Left Arm)   Pulse 87   Temp 98.1 F (36.7 C) (Oral)   Resp 16   SpO2 95%   Physical Exam  Constitutional: She appears well-developed and well-nourished.  HENT:  Head: Normocephalic and atraumatic.  Right Ear: External ear normal.  Left Ear: External ear normal.  Eyes: Conjunctivae are normal. Right eye exhibits no discharge. Left eye exhibits no discharge. No scleral icterus.  Cardiovascular:  Pulses:      Dorsalis pedis pulses are 2+ on the right side, and 2+ on the left side.       Posterior tibial pulses are 2+ on the right side, and 2+ on the left side.  No lower extremity edema. Calves symmetric in size b/l.   Pulmonary/Chest: Effort normal. No respiratory distress.  Musculoskeletal:       Left hip: Normal.       Left ankle: Normal.       Left lower leg:  Normal.       Left foot: Normal.  Left knee:  Appearance normal. No obvious deformity. No skin swelling, erythema, heat, fluctuance or break of the skin. TTP over posterior knee/proximal calf.  Active and passive flexion and extension intact. Noted crepitus. Negative Lachman's test. Negative anterior/poster drawer bilaterally. Equivocal McMurray's test for left posterior. Negative ballottement test. No varus or valgus laxity or locking. No TTP of hips or ankles. Compartments soft. Neurovascularly intact distally to site of injury.  Neurological: She is alert.  Skin: Skin is warm and dry. Capillary refill takes less than 2 seconds. No erythema. No pallor.  Psychiatric: She has a normal mood and affect.  Nursing note and vitals reviewed.    ED Treatments / Results  Labs (all labs ordered are listed, but only abnormal results are displayed) Labs Reviewed - No data to display  EKG None  Radiology Dg Knee Complete 4 Views Left  Result Date: 01/19/2018 CLINICAL DATA:  Left knee injury stepping down from stool 1 week ago with persistent pain, initial encounter EXAM: LEFT KNEE - COMPLETE 4+ VIEW COMPARISON:  None. FINDINGS: Tricompartmental degenerative changes are noted worst in the patellofemoral space. No acute fracture or dislocation is noted. No significant joint effusion is seen. IMPRESSION: Degenerative change without acute abnormality. Electronically Signed   By: Inez Catalina M.D.   On: 01/19/2018 16:05    Procedures Procedures (including critical care time)  Medications Ordered in ED Medications - No data to display   Initial Impression / Assessment and Plan / ED Course  I have reviewed the triage vital signs and the nursing notes.  Pertinent labs & imaging results that were available during my care of the patient were reviewed by me and considered in my medical decision making (see chart for details).     52 year old female who presents with left knee pain after stepping off an ottoman last Wednesday and feeling a pop in  her left knee.  She has been having pain in the posterior aspect of her knee with ambulation since that time.  Patient's vital signs are reassuring on presentation.  Patient is without joint line tenderness palpation.  She does have posterior knee tenderness palpation.  Equivocal McMurray's test.  She is neurovascular intact.  Compartments are soft for the upper and lower extremities.  Knee x-rays with degenerative changes without any acute abnormalities.  Patient is without any fever, joint swelling, overlying erythema has normal range of motion.  Do not suspect septic joint.  Ultrasound is without evidence of traumatic DVT.  Will place patient in a knee immobilizer and prescribed Mobic.  Patient's last creatinine within normal limits.  No history of kidney disease or GI bleed.  Will give referral to orthopedics for follow-up.  Patient given knee immobilizer in the department.  She states she already has crutches at home.  Recommended rice therapy.  I advised the patient to follow-up with PCP otherwise this week. Specific return precautions discussed. Time was given for all questions to be answered. The patient verbalized understanding and agreement with plan. The patient appears safe for discharge home. Patient did not wish for pain medication while she was in the department.   Final Clinical Impressions(s) / ED Diagnoses   Final diagnoses:  Acute pain of left knee    ED Discharge Orders        Ordered    meloxicam (MOBIC) 15 MG tablet  Daily     01/19/18 2007       Maicol Bowland,  Barth Kirks, PA-C 01/19/18 2009    Tegeler, Gwenyth Allegra, MD 01/19/18 [phone]

## 2018-01-19 NOTE — ED Notes (Signed)
Patient verbalized understanding of discharge instructions, no questions. Knee immobilizer applied. Patient out of ED via wheelchair in no distress.

## 2018-01-21 DIAGNOSIS — S83207A Unspecified tear of unspecified meniscus, current injury, left knee, initial encounter: Secondary | ICD-10-CM | POA: Insufficient documentation

## 2018-01-21 DIAGNOSIS — M25562 Pain in left knee: Secondary | ICD-10-CM | POA: Diagnosis not present

## 2018-01-21 DIAGNOSIS — M1712 Unilateral primary osteoarthritis, left knee: Secondary | ICD-10-CM | POA: Insufficient documentation

## 2018-01-21 HISTORY — DX: Unilateral primary osteoarthritis, left knee: M17.12

## 2018-02-03 DIAGNOSIS — F3181 Bipolar II disorder: Secondary | ICD-10-CM | POA: Diagnosis not present

## 2018-02-07 ENCOUNTER — Encounter (INDEPENDENT_AMBULATORY_CARE_PROVIDER_SITE_OTHER): Payer: Self-pay | Admitting: "Endocrinology

## 2018-02-13 ENCOUNTER — Encounter (INDEPENDENT_AMBULATORY_CARE_PROVIDER_SITE_OTHER): Payer: Self-pay | Admitting: "Endocrinology

## 2018-02-13 ENCOUNTER — Other Ambulatory Visit (INDEPENDENT_AMBULATORY_CARE_PROVIDER_SITE_OTHER): Payer: Self-pay | Admitting: *Deleted

## 2018-02-13 DIAGNOSIS — E1110 Type 2 diabetes mellitus with ketoacidosis without coma: Secondary | ICD-10-CM

## 2018-02-13 DIAGNOSIS — Z794 Long term (current) use of insulin: Principal | ICD-10-CM

## 2018-02-13 MED ORDER — FREESTYLE LITE DEVI: 4 refills | Status: DC

## 2018-02-26 DIAGNOSIS — F3181 Bipolar II disorder: Secondary | ICD-10-CM | POA: Diagnosis not present

## 2018-02-26 DIAGNOSIS — F419 Anxiety disorder, unspecified: Secondary | ICD-10-CM | POA: Diagnosis not present

## 2018-03-23 ENCOUNTER — Telehealth: Payer: Self-pay | Admitting: Nurse Practitioner

## 2018-03-23 DIAGNOSIS — Z79899 Other long term (current) drug therapy: Secondary | ICD-10-CM

## 2018-03-23 NOTE — Telephone Encounter (Signed)
Yes it is okay to order these labs for her thanks

## 2018-03-23 NOTE — Telephone Encounter (Signed)
Left detailed mess informing pt labs are ordered.

## 2018-03-23 NOTE — Telephone Encounter (Signed)
Lab request is for fasting  A1c, Lipid panel, cholesterol panel, CMP, CBC w/diff, Vit D, B12, TSH.  Dx: D39.584.  Per Lattie Haw, the labs are required due to pt taking Geodon.  Ok to order labs? Fax Results back to [phone]. Ph #: [phone]

## 2018-03-23 NOTE — Telephone Encounter (Signed)
Tracy [name] with [facility] has faxed over a list of labs she would like the patient to have done.  Patient would like to have these labs done at our lab.  Patient does understand that this has to be approved because we do not have a public lab.  Patient would like a call back if this fax has been received or not.

## 2018-03-25 ENCOUNTER — Other Ambulatory Visit (INDEPENDENT_AMBULATORY_CARE_PROVIDER_SITE_OTHER): Payer: 59

## 2018-03-25 DIAGNOSIS — Z79899 Other long term (current) drug therapy: Secondary | ICD-10-CM

## 2018-03-25 DIAGNOSIS — F3181 Bipolar II disorder: Secondary | ICD-10-CM | POA: Diagnosis not present

## 2018-03-25 LAB — CBC WITH DIFFERENTIAL/PLATELET
BASOS ABS: 0 10*3/uL (ref 0.0–0.1)
Basophils Relative: 0.6 % (ref 0.0–3.0)
Eosinophils Absolute: 0.1 10*3/uL (ref 0.0–0.7)
Eosinophils Relative: 2.4 % (ref 0.0–5.0)
HEMATOCRIT: 42.4 % (ref 36.0–46.0)
Hemoglobin: 13.8 g/dL (ref 12.0–15.0)
LYMPHS PCT: 33.8 % (ref 12.0–46.0)
Lymphs Abs: 1.8 10*3/uL (ref 0.7–4.0)
MCHC: 32.6 g/dL (ref 30.0–36.0)
MCV: 82 fl (ref 78.0–100.0)
MONOS PCT: 7.7 % (ref 3.0–12.0)
Monocytes Absolute: 0.4 10*3/uL (ref 0.1–1.0)
NEUTROS PCT: 55.5 % (ref 43.0–77.0)
Neutro Abs: 3 10*3/uL (ref 1.4–7.7)
Platelets: 224 10*3/uL (ref 150.0–400.0)
RBC: 5.17 Mil/uL — AB (ref 3.87–5.11)
RDW: 18.3 % — ABNORMAL HIGH (ref 11.5–15.5)
WBC: 5.3 10*3/uL (ref 4.0–10.5)

## 2018-03-25 LAB — LIPID PANEL
Cholesterol: 184 mg/dL (ref 0–200)
HDL: 39.9 mg/dL (ref >39.00)
LDL Cholesterol: 115 mg/dL — ABNORMAL HIGH (ref 0–99)
NONHDL: 144.31
Total CHOL/HDL Ratio: 5
Triglycerides: 148 mg/dL (ref 0.0–149.0)
VLDL: 29.6 mg/dL (ref 0.0–40.0)

## 2018-03-25 LAB — COMPREHENSIVE METABOLIC PANEL
ALT: 24 U/L (ref 0–35)
AST: 20 U/L (ref 0–37)
Albumin: 4 g/dL (ref 3.5–5.2)
Alkaline Phosphatase: 90 U/L (ref 39–117)
BILIRUBIN TOTAL: 0.4 mg/dL (ref 0.2–1.2)
BUN: 11 mg/dL (ref 6–23)
CO2: 25 mEq/L (ref 19–32)
CREATININE: 0.82 mg/dL (ref 0.40–1.20)
Calcium: 10 mg/dL (ref 8.4–10.5)
Chloride: 104 mEq/L (ref 96–112)
GFR: 78.3 mL/min (ref >60.00)
GLUCOSE: 107 mg/dL — AB (ref 70–99)
Potassium: 4.1 mEq/L (ref 3.5–5.1)
Sodium: 138 mEq/L (ref 135–145)
Total Protein: 7.4 g/dL (ref 6.0–8.3)

## 2018-03-25 LAB — VITAMIN B12: VITAMIN B 12: 240 pg/mL (ref 211–911)

## 2018-03-25 LAB — VITAMIN D 25 HYDROXY (VIT D DEFICIENCY, FRACTURES): VITD: 22.91 ng/mL — ABNORMAL LOW (ref 30.00–100.00)

## 2018-03-25 LAB — HEMOGLOBIN A1C: Hgb A1c MFr Bld: 6 % (ref 4.6–6.5)

## 2018-03-25 LAB — TSH: TSH: 0.34 u[IU]/mL — AB (ref 0.35–4.50)

## 2018-03-26 ENCOUNTER — Encounter: Payer: Self-pay | Admitting: Nurse Practitioner

## 2018-03-26 DIAGNOSIS — K7581 Nonalcoholic steatohepatitis (NASH): Secondary | ICD-10-CM | POA: Diagnosis not present

## 2018-03-26 DIAGNOSIS — R161 Splenomegaly, not elsewhere classified: Secondary | ICD-10-CM | POA: Diagnosis not present

## 2018-03-26 DIAGNOSIS — N200 Calculus of kidney: Secondary | ICD-10-CM | POA: Diagnosis not present

## 2018-04-01 ENCOUNTER — Encounter: Payer: Self-pay | Admitting: *Deleted

## 2018-04-02 DIAGNOSIS — F3181 Bipolar II disorder: Secondary | ICD-10-CM | POA: Diagnosis not present

## 2018-04-08 ENCOUNTER — Other Ambulatory Visit (INDEPENDENT_AMBULATORY_CARE_PROVIDER_SITE_OTHER): Payer: Self-pay | Admitting: "Endocrinology

## 2018-04-08 DIAGNOSIS — R1013 Epigastric pain: Secondary | ICD-10-CM

## 2018-04-11 ENCOUNTER — Encounter: Payer: Self-pay | Admitting: Nurse Practitioner

## 2018-04-13 ENCOUNTER — Ambulatory Visit: Payer: 59 | Admitting: Neurology

## 2018-04-13 ENCOUNTER — Encounter

## 2018-04-13 ENCOUNTER — Other Ambulatory Visit: Payer: Self-pay | Admitting: Nurse Practitioner

## 2018-04-13 DIAGNOSIS — E559 Vitamin D deficiency, unspecified: Secondary | ICD-10-CM

## 2018-04-13 MED ORDER — VITAMIN D (ERGOCALCIFEROL) 1.25 MG (50000 UNIT) PO CAPS: 50000.0000 [IU] | ORAL_CAPSULE | ORAL | 0 refills | Status: DC

## 2018-04-13 NOTE — Progress Notes (Signed)
orders

## 2018-04-14 ENCOUNTER — Ambulatory Visit: Payer: 59 | Admitting: Family

## 2018-04-14 ENCOUNTER — Ambulatory Visit: Payer: Self-pay | Admitting: Family

## 2018-04-14 ENCOUNTER — Encounter: Payer: Self-pay | Admitting: Family

## 2018-04-14 ENCOUNTER — Other Ambulatory Visit: Payer: 59

## 2018-04-14 VITALS — BP 104/70 | HR 77 | Temp 98.4°F | Ht 70.47 in | Wt 294.1 lb

## 2018-04-14 DIAGNOSIS — R300 Dysuria: Secondary | ICD-10-CM

## 2018-04-14 DIAGNOSIS — R1031 Right lower quadrant pain: Secondary | ICD-10-CM

## 2018-04-14 LAB — POC URINALSYSI DIPSTICK (AUTOMATED)
Bilirubin, UA: NEGATIVE
Blood, UA: NEGATIVE
Glucose, UA: NEGATIVE
Ketones, UA: NEGATIVE
Leukocytes, UA: NEGATIVE
Nitrite, UA: NEGATIVE
Protein, UA: NEGATIVE
Spec Grav, UA: 1.02 (ref 1.010–1.025)
Urobilinogen, UA: 0.2 E.U./dL
pH, UA: 6 (ref 5.0–8.0)

## 2018-04-14 NOTE — Progress Notes (Signed)
Tracy [name] is a 52 y.o. female with the following history as recorded in EpicCare:  Patient Active Problem List   Diagnosis Date Noted  . Encounter for general adult medical examination with abnormal findings 01/04/2018  . Vitamin D deficiency 01/04/2018  . Lower back pain 07/18/2017  . Hemorrhoids, internal 07/18/2017  . Trigeminal neuralgia 01/18/2016  . Bipolar disorder ([place]) 11/28/2015  . Borderline personality disorder ([place]) 11/28/2015  . Carpal tunnel syndrome 09/06/2015  . MDD (major depressive disorder) 06/06/2015  . Severe episode of recurrent major depressive disorder, without psychotic features ([place])   . History of posttraumatic stress disorder (PTSD)   . GAD (generalized anxiety disorder)   . Insulin resistance 02/09/2015  . 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 Acanthosis nigricans, acquired 02/09/2015  . Nonalcoholic fatty liver disease 02/09/2015  . Hepatic cirrhosis ([place]) 02/09/2015  . Female hirsutism 02/09/2015  . Oligomenorrhea 02/09/2015  . Infertility associated with anovulation 02/09/2015  . Morbid obesity ([place]) 10/31/2014  . Anxiety and depression 10/31/2014  . Obstructive sleep apnea 06/27/2014  . Diabetes mellitus, type 2 ([place]) 06/10/2013  . 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 Cystic teratoma   . PCOS (polycystic ovarian syndrome)   . Infertility, female   . TOBACCO USE, QUIT 02/08/2010  . HYPOTHYROIDISM, POSTSURGICAL 10/02/2007  . POLYCYSTIC OVARIAN DISEASE 10/02/2007  . Elevated cholesterol 10/02/2007  . DEPRESSION 10/02/2007  . Migraine 10/02/2007  . Essential hypertension 10/02/2007  . RENAL CALCULUS, HX OF 10/02/2007    Current Outpatient Medications  Medication Sig Dispense Refill  . Blood Glucose Monitoring Suppl (FREESTYLE FREEDOM LITE) w/Device KIT USE TO CHECK GLUCOSE  4  . cephALEXin (KEFLEX) 500 MG capsule     . clonazePAM (KLONOPIN) 1 MG tablet Take 0.5-1 mg by mouth 3 (three) times daily. Take 0.16m twice daily and  then take 1 mg at bedtime  5  . cyclobenzaprine (FLEXERIL) 10 MG tablet Take 10 mg by mouth daily as needed.    . furosemide (LASIX) 20 MG tablet Take 1 tablet (20 mg total) by mouth daily. 30 tablet 6  . ibuprofen (ADVIL,MOTRIN) 600 MG tablet ibuprofen 600 mg tablet  TAKE 1 TABLET (600 MG TOTAL) BY MOUTH THREE (3) TIMES A DAY.    .Marland KitchenlamoTRIgine (LAMICTAL) 150 MG tablet Take 300 mg by mouth daily.     .Marland KitchenlamoTRIgine (LAMICTAL) 200 MG tablet   1  . levothyroxine (SYNTHROID, LEVOTHROID) 300 MCG tablet Take by mouth.    .Marland Kitchenlisinopril-hydrochlorothiazide (PRINZIDE,ZESTORETIC) 10-12.5 MG tablet Take 1 tablet by mouth every morning. 90 tablet 0  . ondansetron (ZOFRAN-ODT) 4 MG disintegrating tablet ondansetron 4 mg disintegrating tablet  DIS ONE T PO  Q 4 H PRF NAUSEA OR VOMITING    . Oxcarbazepine (TRILEPTAL) 300 MG tablet     . phenazopyridine (PYRIDIUM) 100 MG tablet Take 100 mg by mouth 3 (three) times daily.  0  . ranitidine (ZANTAC) 150 MG tablet Take 1 tablet by mouth 2 times daily 180 tablet 1  . rizatriptan (MAXALT) 10 MG tablet Take 1 tablet (10 mg total) by mouth as needed for migraine. May repeat in 2 hours if needed 10 tablet 0  . SYNTHROID 200 MCG tablet Take 1.5 of brand Synthroid, 200 mcg tablets each morning. 135 tablet 4  . Vitamin D, Ergocalciferol, (DRISDOL) 50000 units CAPS capsule Take 1 capsule (50,000 Units total) by mouth every 7 (seven) days. 12 capsule 0  . ziprasidone (GEODON) 60 MG capsule      No current facility-administered  medications for this visit.     Allergies: Sulfa antibiotics; Bupropion; Bupropion hcl; Codeine; and Sulfamethoxazole-trimethoprim  Past Medical History:  Diagnosis Date  . Anxiety   . Arthritis    back, knees, right elbow  . Bipolar disorder ([place])   . Cirrhosis ([place])   . Dental crowns present   . Depression   . Diabetes mellitus without complication ([place])   . GERD (gastroesophageal reflux disease)   . High cholesterol    no current med.   Marland Kitchen History of kidney stones   . History of migraine headaches   . Hypertension    under control with meds., has been on med. x 4 yr.  . Hypothyroidism   . NASH (nonalcoholic steatohepatitis)   . PCOS (polycystic ovarian syndrome)   . PONV (postoperative nausea and vomiting)    also hx. of emergence delirium 2007  . Sleep apnea    uses CPAP nightly  . Trigeminal neuralgia     Past Surgical History:  Procedure Laterality Date  . ACHILLES TENDON SURGERY Left 2007  . APPENDECTOMY    . CARPAL TUNNEL RELEASE Right 01/12/2016   Procedure: RIGHT CARPAL TUNNEL RELEASE;  Surgeon: Roseanne Kaufman, MD;  Location: [facility];  Service: Orthopedics;  Laterality: Right;  . COLONOSCOPY WITH PROPOFOL N/A 04/20/2014   Procedure: COLONOSCOPY WITH PROPOFOL;  Surgeon: Arta Silence, MD;  [facility];  Service: Endoscopy;  Laterality: N/A;  . ESOPHAGOGASTRODUODENOSCOPY (EGD) WITH PROPOFOL N/A 04/20/2014   Procedure: ESOPHAGOGASTRODUODENOSCOPY (EGD) WITH PROPOFOL;  Surgeon: Arta Silence, MD;  [facility];  Service: Endoscopy;  Laterality: N/A;  . EXCISION HAGLUND'S DEFORMITY WITH ACHILLES TENDON REPAIR Right 02/25/2013   Procedure: RIGHT ACHILLES DEBRIDEMENT AND RECONSTRUCTION;  HAGLUND'S EXCISION; GASTROC RECESSION AND FLEXOR HALLUCIS LONGUS TRANSFER;  Surgeon: Wylene Simmer, MD;  Location: [facility];  Service: Orthopedics;  Laterality: Right;  . LUMBAR LAMINECTOMY     X 3  . neck fusion    . OVARIAN CYST SURGERY Left 1998  . PILONIDAL CYST EXCISION  1994  . RIGHT OOPHORECTOMY    . STERIOD INJECTION Left 01/12/2016   Procedure: STEROID INJECTION LEFT WRIST;  Surgeon: Roseanne Kaufman, MD;  Location: [facility];  Service: Orthopedics;  Laterality: Left;  . TOTAL THYROIDECTOMY  2003  . UPPER GI ENDOSCOPY  01/19/2015    Family History  Problem Relation Age of Onset  . Heart disease Father   . Depression Father   . Cancer Brother        THROAT   . Depression Brother   . Anxiety disorder Brother   . Depression Sister   . Breast cancer Mother   . Hypertension Mother     Social History   Tobacco Use  . Smoking status: Former Smoker    Packs/day: 0.50    Years: 15.00    Pack years: 7.50    Types: Cigarettes    Last attempt to quit: 12/08/2016    Years since quitting: 1.3  . Smokeless tobacco: Never Used  . Tobacco comment: stopped   Substance Use Topics  . Alcohol use: No    Alcohol/week: 0.0 standard drinks    Comment: Has not had any alcohol since 07/2014 - before this date, she rarely drink.    Subjective:  Patient was initially treated for UTI approximately 1 week ago; treated with 5 days of Macrobid; symptoms did improve on Macrobid; within 2-3 days of stopping the Macrobid, symptoms returned; on Saturday, did another e-visit and  was started on Keflex was told that if symptoms persisted, she needed to be seen in the office for follow-up; Patient is now concerned that she may be passing a kidney stone; "felt like I was peeing a razor blades." No blood in urine; no fever; pain is localized over right side; does have prior history of kidney stones;      Objective:  Vitals:   04/14/18 1622  BP: 104/70  Pulse: 77  Temp: 98.4 F (36.9 C)  TempSrc: Oral  SpO2: 96%  Weight: 294 lb 1.3 oz (133.4 kg)  Height: 5' 10.47" (1.79 m)    General: Well developed, well nourished, in no acute distress  Skin : Warm and dry.  Head: Normocephalic and atraumatic  Lungs: Respirations unlabored; clear to auscultation bilaterally without wheeze, rales, rhonchi  CVS exam: normal rate and regular rhythm.  Musculoskeletal: No deformities; no active joint inflammation; right sided CVA tenderness Extremities: No edema, cyanosis, clubbing  Vessels: Symmetric bilaterally  Neurologic: Alert and oriented; speech intact; face symmetrical; moves all extremities well; CNII-XII intact without focal deficit   Assessment:  1. Dysuria   2. Right  lower quadrant abdominal pain     Plan:  Concern for kidney stone; update urine culture; continue Keflex that has been previously prescribed; will check renal stone CT- patient to call back tomorrow after speaking with her boss about times that she can get the test done; follow-up to be determined.   No follow-ups on file.  Orders Placed This Encounter  Procedures  . Urine Culture    Standing Status:   Future    Standing Expiration Date:   04/14/2019  . CT RENAL STONE STUDY    Standing Status:   Future    Standing Expiration Date:   07/15/2019    Order Specific Question:   Is patient pregnant?    Answer:   No    Order Specific Question:   Preferred imaging location?    Answer:   [place]    Order Specific Question:   Radiology Contrast Protocol - do NOT remove file path    Answer:   [email or web address]  . POCT Urinalysis Dipstick (Automated)    Requested Prescriptions    No prescriptions requested or ordered in this encounter

## 2018-04-15 ENCOUNTER — Encounter: Payer: Self-pay | Admitting: Family

## 2018-04-15 DIAGNOSIS — G4733 Obstructive sleep apnea (adult) (pediatric): Secondary | ICD-10-CM | POA: Diagnosis not present

## 2018-04-15 DIAGNOSIS — F333 Major depressive disorder, recurrent, severe with psychotic symptoms: Secondary | ICD-10-CM | POA: Diagnosis not present

## 2018-04-16 LAB — URINE CULTURE
MICRO NUMBER: 91081759
SPECIMEN QUALITY: ADEQUATE

## 2018-04-20 ENCOUNTER — Inpatient Hospital Stay: Admission: RE | Admit: 2018-04-20 | Payer: Self-pay | Source: Ambulatory Visit

## 2018-04-21 ENCOUNTER — Encounter: Payer: Self-pay | Admitting: Nurse Practitioner

## 2018-04-21 DIAGNOSIS — F3181 Bipolar II disorder: Secondary | ICD-10-CM | POA: Diagnosis not present

## 2018-04-28 ENCOUNTER — Encounter: Payer: Self-pay | Admitting: Family

## 2018-04-28 ENCOUNTER — Encounter: Payer: Self-pay | Admitting: Nurse Practitioner

## 2018-04-29 ENCOUNTER — Encounter (INDEPENDENT_AMBULATORY_CARE_PROVIDER_SITE_OTHER): Payer: Self-pay

## 2018-04-30 DIAGNOSIS — F331 Major depressive disorder, recurrent, moderate: Secondary | ICD-10-CM | POA: Diagnosis not present

## 2018-04-30 DIAGNOSIS — F419 Anxiety disorder, unspecified: Secondary | ICD-10-CM | POA: Diagnosis not present

## 2018-05-06 DIAGNOSIS — F332 Major depressive disorder, recurrent severe without psychotic features: Secondary | ICD-10-CM | POA: Diagnosis not present

## 2018-05-06 DIAGNOSIS — F3181 Bipolar II disorder: Secondary | ICD-10-CM | POA: Diagnosis not present

## 2018-05-06 DIAGNOSIS — F4323 Adjustment disorder with mixed anxiety and depressed mood: Secondary | ICD-10-CM | POA: Diagnosis not present

## 2018-05-07 DIAGNOSIS — F3181 Bipolar II disorder: Secondary | ICD-10-CM | POA: Diagnosis not present

## 2018-05-08 ENCOUNTER — Ambulatory Visit: Payer: Self-pay

## 2018-05-08 NOTE — Telephone Encounter (Signed)
Pt. c/o onset of change in vision of right eye since 3:15 PM.  Reported she started seeing bright lights, and c/o a partial loss of vision of right eye, in the right field of vision.  Reported the change in vision is constant.  Denied any difficulty with speech, facial droop, numbness or weakness of extremities, or balance issues.  Able to move all extremities without difficulty.  C/o mild headache on top of right side of head.  Denied any other symptoms.  Per protocol, advised to go to ER for further evaluation.  Pt. stated she is not sure she will go to the ER.  Reported she will go to ER if her symptoms worsen.        Reason for Disposition . [1] Blurred vision or visual changes AND [2] present now AND [3] sudden onset or new (e.g., minutes, hours, days)  (Exception: seeing floaters / black specks OR previously diagnosed migraine headaches with same symptoms)  Answer Assessment - Initial Assessment Questions 1. SYMPTOM: "What is the main symptom you are concerned about?" (e.g., weakness, numbness)     Seeing bright lights and partial loss of vision of the right eye 2. ONSET: "When did this start?" (minutes, hours, days; while sleeping)     45 min. ago 3. LAST NORMAL: "When was the last time you were normal (no symptoms)?"     3:00 PM 4. PATTERN "Does this come and go, or has it been constant since it started?"  "Is it present now?"    Steady symptoms 5. CARDIAC SYMPTOMS: "Have you had any of the following symptoms: chest pain, difficulty breathing, palpitations?"     Denied the above  6. NEUROLOGIC SYMPTOMS: "Have you had any of the following symptoms: headache, dizziness, vision loss, double vision, changes in speech, unsteady on your feet?"     C/o headache across right top of head, denied any dizziness, denied unilateral weakness of extremities, denied any numbness in right side of face; grasp is equal bilateral; speech clear. C/o bright lights in right field of vision right eye.     7.  OTHER SYMPTOMS: "Do you have any other symptoms?"    Denied  8. PREGNANCY: "Is there any chance you are pregnant?" "When was your last menstrual period?"     No; LMP 05/05/18  Answer Assessment - Initial Assessment Questions 1. DESCRIPTION: "What is the vision loss like? Describe it for me." (e.g., complete vision loss, blurred vision, double vision, floaters, etc.)     Seeing bright lights and has partial loss of field of vision in right eye 2. LOCATION: "One or both eyes?" If one, ask: "Which eye?"     Right eye 3. SEVERITY: "Can you see anything?" If so, ask: "What can you see?" (e.g., fine print)     Only able to see very bright sparkling lights in right field of vision 4. ONSET: "When did this begin?" "Did it start suddenly or has this been gradual?"     About 3:15 PM today 5. PATTERN: "Does this come and go, or has it been constant since it started?"     steady 6. PAIN: "Is there any pain in your eye(s)?"  (Scale 1-10; or mild, moderate, severe)     denied 7. CONTACTS-GLASSES: "Do you wear contacts or glasses?"     glasses 8. CAUSE: "What do you think is causing this visual problem?"     unknown 9. OTHER SYMPTOMS: "Do you have any other symptoms?" (e.g., confusion, headache, arm or  leg weakness, speech problems)    mild headache; no speech prob., extremity weakness, or balance issues.  10. PREGNANCY: "Is there any chance you are pregnant?" "When was your last menstrual period?"       No; LMP 05/05/18  Protocols used: VISION LOSS OR CHANGE-A-AH, NEUROLOGIC DEFICIT-A-AH

## 2018-05-19 DIAGNOSIS — F3181 Bipolar II disorder: Secondary | ICD-10-CM | POA: Diagnosis not present

## 2018-05-20 DIAGNOSIS — F332 Major depressive disorder, recurrent severe without psychotic features: Secondary | ICD-10-CM | POA: Diagnosis not present

## 2018-05-21 DIAGNOSIS — F332 Major depressive disorder, recurrent severe without psychotic features: Secondary | ICD-10-CM | POA: Diagnosis not present

## 2018-05-22 DIAGNOSIS — F332 Major depressive disorder, recurrent severe without psychotic features: Secondary | ICD-10-CM | POA: Diagnosis not present

## 2018-05-25 DIAGNOSIS — F332 Major depressive disorder, recurrent severe without psychotic features: Secondary | ICD-10-CM | POA: Diagnosis not present

## 2018-05-26 DIAGNOSIS — F332 Major depressive disorder, recurrent severe without psychotic features: Secondary | ICD-10-CM | POA: Diagnosis not present

## 2018-05-27 DIAGNOSIS — F332 Major depressive disorder, recurrent severe without psychotic features: Secondary | ICD-10-CM | POA: Diagnosis not present

## 2018-05-28 DIAGNOSIS — F332 Major depressive disorder, recurrent severe without psychotic features: Secondary | ICD-10-CM | POA: Diagnosis not present

## 2018-05-29 DIAGNOSIS — F332 Major depressive disorder, recurrent severe without psychotic features: Secondary | ICD-10-CM | POA: Diagnosis not present

## 2018-06-01 DIAGNOSIS — F332 Major depressive disorder, recurrent severe without psychotic features: Secondary | ICD-10-CM | POA: Diagnosis not present

## 2018-06-02 DIAGNOSIS — F332 Major depressive disorder, recurrent severe without psychotic features: Secondary | ICD-10-CM | POA: Diagnosis not present

## 2018-06-02 DIAGNOSIS — F3181 Bipolar II disorder: Secondary | ICD-10-CM | POA: Diagnosis not present

## 2018-06-03 DIAGNOSIS — F332 Major depressive disorder, recurrent severe without psychotic features: Secondary | ICD-10-CM | POA: Diagnosis not present

## 2018-06-04 DIAGNOSIS — F332 Major depressive disorder, recurrent severe without psychotic features: Secondary | ICD-10-CM | POA: Diagnosis not present

## 2018-06-05 DIAGNOSIS — F332 Major depressive disorder, recurrent severe without psychotic features: Secondary | ICD-10-CM | POA: Diagnosis not present

## 2018-06-08 DIAGNOSIS — F332 Major depressive disorder, recurrent severe without psychotic features: Secondary | ICD-10-CM | POA: Diagnosis not present

## 2018-06-09 DIAGNOSIS — F332 Major depressive disorder, recurrent severe without psychotic features: Secondary | ICD-10-CM | POA: Diagnosis not present

## 2018-06-10 DIAGNOSIS — F332 Major depressive disorder, recurrent severe without psychotic features: Secondary | ICD-10-CM | POA: Diagnosis not present

## 2018-06-11 DIAGNOSIS — F332 Major depressive disorder, recurrent severe without psychotic features: Secondary | ICD-10-CM | POA: Diagnosis not present

## 2018-06-12 DIAGNOSIS — F332 Major depressive disorder, recurrent severe without psychotic features: Secondary | ICD-10-CM | POA: Diagnosis not present

## 2018-06-15 ENCOUNTER — Encounter: Payer: Self-pay | Admitting: Nurse Practitioner

## 2018-06-15 DIAGNOSIS — F332 Major depressive disorder, recurrent severe without psychotic features: Secondary | ICD-10-CM | POA: Diagnosis not present

## 2018-06-16 DIAGNOSIS — F332 Major depressive disorder, recurrent severe without psychotic features: Secondary | ICD-10-CM | POA: Diagnosis not present

## 2018-06-17 DIAGNOSIS — F332 Major depressive disorder, recurrent severe without psychotic features: Secondary | ICD-10-CM | POA: Diagnosis not present

## 2018-06-18 DIAGNOSIS — F332 Major depressive disorder, recurrent severe without psychotic features: Secondary | ICD-10-CM | POA: Diagnosis not present

## 2018-06-19 DIAGNOSIS — F332 Major depressive disorder, recurrent severe without psychotic features: Secondary | ICD-10-CM | POA: Diagnosis not present

## 2018-06-22 ENCOUNTER — Emergency Department (HOSPITAL_COMMUNITY): Payer: 59

## 2018-06-22 ENCOUNTER — Encounter (HOSPITAL_COMMUNITY): Payer: Self-pay

## 2018-06-22 ENCOUNTER — Ambulatory Visit: Payer: Self-pay | Admitting: *Deleted

## 2018-06-22 ENCOUNTER — Emergency Department (HOSPITAL_COMMUNITY)
Admission: EM | Admit: 2018-06-22 | Discharge: 2018-06-22 | Disposition: A | Discharge status: Home / Self Care | Payer: 59 | Attending: Emergency Medicine | Admitting: Emergency Medicine

## 2018-06-22 ENCOUNTER — Other Ambulatory Visit: Payer: Self-pay

## 2018-06-22 DIAGNOSIS — G8929 Other chronic pain: Secondary | ICD-10-CM | POA: Diagnosis not present

## 2018-06-22 DIAGNOSIS — R110 Nausea: Secondary | ICD-10-CM | POA: Diagnosis not present

## 2018-06-22 DIAGNOSIS — K920 Hematemesis: Secondary | ICD-10-CM | POA: Diagnosis not present

## 2018-06-22 DIAGNOSIS — I10 Essential (primary) hypertension: Secondary | ICD-10-CM | POA: Diagnosis not present

## 2018-06-22 DIAGNOSIS — F17210 Nicotine dependence, cigarettes, uncomplicated: Secondary | ICD-10-CM | POA: Diagnosis not present

## 2018-06-22 DIAGNOSIS — E039 Hypothyroidism, unspecified: Secondary | ICD-10-CM | POA: Diagnosis not present

## 2018-06-22 DIAGNOSIS — R112 Nausea with vomiting, unspecified: Secondary | ICD-10-CM | POA: Insufficient documentation

## 2018-06-22 DIAGNOSIS — E119 Type 2 diabetes mellitus without complications: Secondary | ICD-10-CM | POA: Insufficient documentation

## 2018-06-22 DIAGNOSIS — R109 Unspecified abdominal pain: Secondary | ICD-10-CM | POA: Diagnosis not present

## 2018-06-22 DIAGNOSIS — Z79899 Other long term (current) drug therapy: Secondary | ICD-10-CM | POA: Insufficient documentation

## 2018-06-22 LAB — COMPREHENSIVE METABOLIC PANEL
ALT: 30 U/L (ref 0–44)
AST: 25 U/L (ref 15–41)
Albumin: 4.5 g/dL (ref 3.5–5.0)
Alkaline Phosphatase: 82 U/L (ref 38–126)
Anion gap: 10 (ref 5–15)
BILIRUBIN TOTAL: 0.6 mg/dL (ref 0.3–1.2)
BUN: 13 mg/dL (ref 6–20)
CALCIUM: 9.6 mg/dL (ref 8.9–10.3)
CHLORIDE: 102 mmol/L (ref 98–111)
CO2: 26 mmol/L (ref 22–32)
CREATININE: 0.86 mg/dL (ref 0.44–1.00)
GFR calc Af Amer: >60 mL/min (ref >60)
Glucose, Bld: 95 mg/dL (ref 70–99)
POTASSIUM: 3.7 mmol/L (ref 3.5–5.1)
Sodium: 138 mmol/L (ref 135–145)
Total Protein: 8 g/dL (ref 6.5–8.1)

## 2018-06-22 LAB — CBC WITH DIFFERENTIAL/PLATELET
Abs Immature Granulocytes: 0.02 10*3/uL (ref 0.00–0.07)
BASOS PCT: 0 %
Basophils Absolute: 0 10*3/uL (ref 0.0–0.1)
EOS ABS: 0.1 10*3/uL (ref 0.0–0.5)
EOS PCT: 1 %
HEMATOCRIT: 45.9 % (ref 36.0–46.0)
Hemoglobin: 14.3 g/dL (ref 12.0–15.0)
IMMATURE GRANULOCYTES: 0 %
LYMPHS ABS: 1.4 10*3/uL (ref 0.7–4.0)
Lymphocytes Relative: 19 %
MCH: 26.6 pg (ref 26.0–34.0)
MCHC: 31.2 g/dL (ref 30.0–36.0)
MCV: 85.3 fL (ref 80.0–100.0)
MONOS PCT: 5 %
Monocytes Absolute: 0.4 10*3/uL (ref 0.1–1.0)
NEUTROS PCT: 75 %
NRBC: 0 % (ref 0.0–0.2)
Neutro Abs: 5.3 10*3/uL (ref 1.7–7.7)
PLATELETS: 243 10*3/uL (ref 150–400)
RBC: 5.38 MIL/uL — ABNORMAL HIGH (ref 3.87–5.11)
RDW: 16.8 % — AB (ref 11.5–15.5)
WBC: 7.1 10*3/uL (ref 4.0–10.5)

## 2018-06-22 LAB — PROTIME-INR
INR: 0.99
Prothrombin Time: 13 seconds (ref 11.4–15.2)

## 2018-06-22 LAB — LIPASE, BLOOD: LIPASE: 34 U/L (ref 11–51)

## 2018-06-22 MED ORDER — OMEPRAZOLE 20 MG PO CPDR: 20.0000 mg | DELAYED_RELEASE_CAPSULE | Freq: Every day | ORAL | 0 refills | Status: DC

## 2018-06-22 NOTE — ED Provider Notes (Signed)
[facility] DEPT Provider Note   CSN: [id number] Arrival date & time: 06/22/18  0841     History   Chief Complaint Chief Complaint  Patient presents with  . Abdominal Pain  . Hematemesis    HPI Tracy [name] is a 52 y.o. female.  Patient with history of Karlene Lineman cirrhosis presents to the emergency department today with complaint of hematemesis.  Patient states that she has chronic nausea and vomits about 2-3 times per week on average.  She also has chronic abdominal pain.  She takes Zofran at home but it does not seem to help much.  Patient has had recent hematemesis.  She has noted an increase amount of blood, up to a half a cup, with recent episodes.  Episodes occur 2-3 times a week.  Patient is followed at [facility] for her liver disease.  She states that she is overdue for an EGD but has not had esophageal varices in the past.  She denies any new or changing abdominal pain from her baseline.  She denies blood in her stool, blood in the urine, or other areas of bleeding.  No chest pain, shortness of breath, or difficulty breathing.  No recent fever or cough.  States that she came to the hospital today because her husband noted the amount of blood in was worried.  Patient is a Marine scientist. The onset of this condition was acute. The course is intermittent. Aggravating factors: none. Alleviating factors: none.       Past Medical History:  Diagnosis Date  . Anxiety   . Arthritis    back, knees, right elbow  . Bipolar disorder ([place])   . Cirrhosis ([place])   . Dental crowns present   . Depression   . Diabetes mellitus without complication ([place])   . GERD (gastroesophageal reflux disease)   . High cholesterol    no current med.  Marland Kitchen History of kidney stones   . History of migraine headaches   . Hypertension    under control with meds., has been on med. x 4 yr.  . Hypothyroidism   . NASH (nonalcoholic steatohepatitis)   . PCOS (polycystic ovarian syndrome)    . PONV (postoperative nausea and vomiting)    also hx. of emergence delirium 2007  . Sleep apnea    uses CPAP nightly  . Trigeminal neuralgia     Patient Active Problem List   Diagnosis Date Noted  . Encounter for general adult medical examination with abnormal findings 01/04/2018  . Vitamin D deficiency 01/04/2018  . Lower back pain 07/18/2017  . Hemorrhoids, internal 07/18/2017  . Trigeminal neuralgia 01/18/2016  . Bipolar disorder ([place]) 11/28/2015  . Borderline personality disorder ([place]) 11/28/2015  . Carpal tunnel syndrome 09/06/2015  . MDD (major depressive disorder) 06/06/2015  . Severe episode of recurrent major depressive disorder, without psychotic features ([place])   . History of posttraumatic stress disorder (PTSD)   . GAD (generalized anxiety disorder)   . Insulin resistance 02/09/2015  . 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 Acanthosis nigricans, acquired 02/09/2015  . Nonalcoholic fatty liver disease 02/09/2015  . Hepatic cirrhosis ([place]) 02/09/2015  . Female hirsutism 02/09/2015  . Oligomenorrhea 02/09/2015  . Infertility associated with anovulation 02/09/2015  . Morbid obesity ([place]) 10/31/2014  . Anxiety and depression 10/31/2014  . Obstructive sleep apnea 06/27/2014  . Diabetes mellitus, type 2 ([place]) 06/10/2013  . 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[place]) 01/01/2012  . Cystic teratoma   . PCOS (polycystic ovarian syndrome)   . Infertility, female   .  TOBACCO USE, QUIT 02/08/2010  . HYPOTHYROIDISM, POSTSURGICAL 10/02/2007  . POLYCYSTIC OVARIAN DISEASE 10/02/2007  . Elevated cholesterol 10/02/2007  . DEPRESSION 10/02/2007  . Migraine 10/02/2007  . Essential hypertension 10/02/2007  . RENAL CALCULUS, HX OF 10/02/2007    Past Surgical History:  Procedure Laterality Date  . ACHILLES TENDON SURGERY Left 2007  . APPENDECTOMY    . CARPAL TUNNEL RELEASE Right 01/12/2016   Procedure: RIGHT CARPAL TUNNEL RELEASE;  Surgeon: Roseanne Kaufman, MD;  Location: [facility];   Service: Orthopedics;  Laterality: Right;  . COLONOSCOPY WITH PROPOFOL N/A 04/20/2014   Procedure: COLONOSCOPY WITH PROPOFOL;  Surgeon: Arta Silence, MD;  Location: [facility];  Service: Endoscopy;  Laterality: N/A;  . ESOPHAGOGASTRODUODENOSCOPY (EGD) WITH PROPOFOL N/A 04/20/2014   Procedure: ESOPHAGOGASTRODUODENOSCOPY (EGD) WITH PROPOFOL;  Surgeon: Arta Silence, MD;  Location: [facility];  Service: Endoscopy;  Laterality: N/A;  . EXCISION HAGLUND'S DEFORMITY WITH ACHILLES TENDON REPAIR Right 02/25/2013   Procedure: RIGHT ACHILLES DEBRIDEMENT AND RECONSTRUCTION;  HAGLUND'S EXCISION; GASTROC RECESSION AND FLEXOR HALLUCIS LONGUS TRANSFER;  Surgeon: Wylene Simmer, MD;  Location: [place];  Service: Orthopedics;  Laterality: Right;  . LUMBAR LAMINECTOMY     X 3  . neck fusion    . OVARIAN CYST SURGERY Left 1998  . PILONIDAL CYST EXCISION  1994  . RIGHT OOPHORECTOMY    . STERIOD INJECTION Left 01/12/2016   Procedure: STEROID INJECTION LEFT WRIST;  Surgeon: Roseanne Kaufman, MD;  Location: [place];  Service: Orthopedics;  Laterality: Left;  . TOTAL THYROIDECTOMY  2003  . UPPER GI ENDOSCOPY  01/19/2015     OB History    Gravida  0   Para      Term      Preterm      AB      Living        SAB      TAB      Ectopic      Multiple      Live Births               Home Medications    Prior to Admission medications   Medication Sig Start Date End Date Taking? Authorizing Provider  Blood Glucose Monitoring Suppl (FREESTYLE FREEDOM LITE) w/Device KIT USE TO CHECK GLUCOSE 02/13/18   [provider]  cephALEXin (KEFLEX) 500 MG capsule  04/12/18   [provider]  clonazePAM (KLONOPIN) 1 MG tablet Take 0.5-1 mg by mouth 3 (three) times daily. Take 0.55m twice daily and then take 1 mg at bedtime 02/03/17   [provider]  cyclobenzaprine (FLEXERIL) 10 MG tablet Take 10 mg by mouth daily as needed.    [provider]    furosemide (LASIX) 20 MG tablet Take 1 tablet (20 mg total) by mouth daily. 11/11/17   BSherrlyn Hock MD  ibuprofen (ADVIL,MOTRIN) 600 MG tablet ibuprofen 600 mg tablet  TAKE 1 TABLET (600 MG TOTAL) BY MOUTH THREE (3) TIMES A DAY.    [provider]  lamoTRIgine (LAMICTAL) 150 MG tablet Take 300 mg by mouth daily.     [provider]  lamoTRIgine (LAMICTAL) 200 MG tablet  02/26/18   [provider]  levothyroxine (SYNTHROID, LEVOTHROID) 300 MCG tablet Take by mouth.    [provider]  lisinopril-hydrochlorothiazide (PRINZIDE,ZESTORETIC) 10-12.5 MG tablet Take 1 tablet by mouth every morning. 01/02/18   SLance Sell NP  ondansetron (ZOFRAN-ODT) 4 MG disintegrating tablet ondansetron 4 mg  disintegrating tablet  DIS ONE T PO  Q 4 H PRF NAUSEA OR VOMITING    [provider]  Oxcarbazepine (TRILEPTAL) 300 MG tablet  04/08/18   [provider]  phenazopyridine (PYRIDIUM) 100 MG tablet Take 100 mg by mouth 3 (three) times daily. 04/04/18   [provider]  ranitidine (ZANTAC) 150 MG tablet Take 1 tablet by mouth 2 times daily 04/08/18   [name], MD  rizatriptan (MAXALT) 10 MG tablet Take 1 tablet (10 mg total) by mouth as needed for migraine. May repeat in 2 hours if needed 12/23/17   Binnie Rail, MD  SYNTHROID 200 MCG tablet Take 1.5 of brand Synthroid, 200 mcg tablets each morning. 07/07/17 07/03/18  [name], MD  Vitamin D, Ergocalciferol, (DRISDOL) 50000 units CAPS capsule Take 1 capsule (50,000 Units total) by mouth every 7 (seven) days. 04/13/18   Lance Sell, NP  ziprasidone (GEODON) 60 MG capsule  04/08/18   [provider]    Family History Family History  Problem Relation Age of Onset  . Heart disease Father   . Depression Father   . Cancer Brother        THROAT  . Depression Brother   . Anxiety disorder Brother   . Depression Sister   . Breast cancer Mother   . Hypertension  Mother     Social History Social History   Tobacco Use  . Smoking status: Current Every Day Smoker    Packs/day: 0.50    Years: 15.00    Pack years: 7.50    Types: Cigarettes  . Smokeless tobacco: Never Used  Substance Use Topics  . Alcohol use: No    Alcohol/week: 0.0 standard drinks    Comment: Has not had any alcohol since 07/2014 - before this date, she rarely drink.  . Drug use: No     Allergies   Sulfa antibiotics; Bupropion; Bupropion hcl; Codeine; and Sulfamethoxazole-trimethoprim   Review of Systems Review of Systems  Constitutional: Negative for fever.  HENT: Negative for rhinorrhea and sore throat.   Eyes: Negative for redness.  Respiratory: Negative for cough.   Cardiovascular: Negative for chest pain.  Gastrointestinal: Positive for abdominal pain, nausea and vomiting. Negative for blood in stool and diarrhea.  Genitourinary: Negative for dysuria, hematuria and vaginal bleeding.  Musculoskeletal: Negative for myalgias.  Skin: Negative for rash.  Neurological: Negative for headaches.     Physical Exam Updated Vital Signs BP 110/70 (BP Location: Right Arm)   Pulse 71   Temp 98.8 F (37.1 C) (Oral)   Resp 18   Ht _0  (1.803 m)   Wt 122.5 kg   LMP 06/05/2018   SpO2 99%   BMI 37.66 kg/m   Physical Exam  Constitutional: She appears well-developed and well-nourished.  HENT:  Head: Normocephalic and atraumatic.  Eyes: Conjunctivae are normal. Right eye exhibits no discharge. Left eye exhibits no discharge.  No icterus.  Neck: Normal range of motion. Neck supple.  Cardiovascular: Normal rate, regular rhythm and normal heart sounds.  Pulmonary/Chest: Effort normal and breath sounds normal.  Abdominal: Soft. There is generalized tenderness (mild). There is no rebound, no guarding, no tenderness at McBurney's point and negative Murphy's sign.  Neurological: She is alert.  Skin: Skin is warm and dry. No pallor.  No jaundice.  Psychiatric: She has  a normal mood and affect.  Nursing note and vitals reviewed.    ED Treatments / Results  Labs (all labs ordered  are listed, but only abnormal results are displayed) Labs Reviewed - No data to display  EKG None  Radiology No results found.  Procedures Procedures (including critical care time)  Medications Ordered in ED Medications - No data to display   Initial Impression / Assessment and Plan / ED Course  I have reviewed the triage vital signs and the nursing notes.  Pertinent labs & imaging results that were available during my care of the patient were reviewed by me and considered in my medical decision making (see chart for details).     Patient seen and examined. Work-up initiated.   Vital signs reviewed and are as follows: BP 110/70 (BP Location: Right Arm)   Pulse 71   Temp 98.8 F (37.1 C) (Oral)   Resp 18   Ht _0  (1.803 m)   Wt 122.5 kg   LMP 06/05/2018   SpO2 99%   BMI 37.66 kg/m   Lab work is reassuring.  Patient discussed with Dr. Regenia Skeeter who has seen.  I discussed the case with patient's nurse practitioner Sharyon Medicus at [facility] liver clinic.  We discussed lab results.  They will arrange for upper endoscopy and follow-up.  Request that patient be placed on a PPI if not already.  Patient updated on plan and results.  She is comfortable with this.  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  This includes worsening or more persistent hematemesis, melena, worsening or changing pain.  Patient is comfortable with discharged home at this time.  She is not taking omeprazole currently.  She says that she took it in the past and did not really feel like it was helping her.  I discussed the benefits of this medication in her current situation.   Final Clinical Impressions(s) / ED Diagnoses   Final diagnoses:  Hematemesis with nausea   Patient with recent hematemesis, worsening in amount.  Patient with normal hemoglobin.  She is  asymptomatic.  Liver enzymes are reassuring today.  Normal INR.  She appears well.  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  I have discussed her case with her GI provider.  They will arrange for follow-up and upper endoscopy as she has not had one since 2016.  Patient stable for discharge to home at this time.  ED Discharge Orders         Ordered    omeprazole (PRILOSEC) 20 MG capsule  Daily     06/22/18 1209           Carlisle Cater, PA-C 06/22/18 1216    Sherwood Gambler, MD 06/22/18 1545    Sherwood Gambler, MD 06/23/18 1126

## 2018-06-22 NOTE — Telephone Encounter (Signed)
Pt c/o vomiting blood this morning X's3. She stated that the blood was bright red. She has some nausea and abd cramping. She has taken Zofran for the nausea. Still having abd cramping which is constant. This started about a month ago. Denies dizziness. Not on blood thinners. Would like to speak to a provider,. so requested a call back today please.  Per protocol pt should be seen at the ED or pcp alternate.  Reason for Disposition . High-risk adult (e.g., diabetes mellitus, brain tumor, V-P shunt, inguinal hernia, chemotherapy)  Answer Assessment - Initial Assessment Questions 1. APPEARANCE of BLOOD: "What does the blood look like?" (e.g., color, coffee-grounds)     Bright red 2. AMOUNT: "How much blood was lost?"     About a half of cup 3. VOMITING BLOOD: "How many times did it happen?" or "How many times in the past 24 hours?"     3 times 4. VOMITING WITHOUT BLOOD: "How many times in the past 24 hours?"      3 times 5. ONSET: "When did vomiting of blood begin?"     This past month 6. CAUSE: "What do you think is causing the vomiting of blood?"     Not sure 7. BLOOD THINNERS: "Do you take any blood thinners?" (e.g., Coumadin/warfarin, Pradaxa/dabigatran, aspirin)     no 8. DEHYDRATION: "Are there any signs of dehydration?" "When was the last time you urinated?" "Do you feel dizzy?"     No sign of dehydration 9. ABDOMINAL PAIN: "Are you having any abdominal pain?" If yes: "What does it feel like? " (e.g., crampy, dull, intermittent, constant)      crampy and nauseated 10. DIARRHEA: "Is there any diarrhea?" If so, ask: "How many times today?"        no 11. OTHER SYMPTOMS: "Do you have any other symptoms?" (e.g., fever, blood in stool)       Nausea 12. PREGNANCY: "Is there any chance you are pregnant?" "When was your last menstrual period?"       No LMP 11/01  Protocols used: VOMITING BLOOD-A-AH

## 2018-06-22 NOTE — Telephone Encounter (Signed)
Left detailed message informing pt of below.  

## 2018-06-22 NOTE — Telephone Encounter (Signed)
It looks like she was seen in ED for this, I agree with their recommendations to F/U with her GI provider for further evaluation.

## 2018-06-22 NOTE — Discharge Instructions (Signed)
Please read and follow all provided instructions.  Your diagnoses today include:  1. Hematemesis with nausea     Tests performed today include:  Blood counts and electrolytes  Blood tests to check liver and kidney function  Blood tests to check pancreas function  Urine test to look for infection  Vital signs. See below for your results today.   Medications prescribed:   Omeprazole (Prilosec) - stomach acid reducer  This medication can be found over-the-counter  Take any prescribed medications only as directed.  Home care instructions:   Follow any educational materials contained in this packet.  Follow-up instructions: We spoke with your gastroenterology provider today.  They will be scheduling you a follow-up appointment as well as an upper endoscopy.  Please call them if you have not heard from them in the next day or 2.  Return instructions:  SEEK IMMEDIATE MEDICAL ATTENTION IF:  You have larger amount of bleeding vomiting or persistent vomiting  If you develop black stools  The pain does not go away or becomes severe   A temperature above 101F develops   Repeated vomiting occurs (multiple episodes)   The pain becomes localized to portions of the abdomen. The right side could possibly be appendicitis. In an adult, the left lower portion of the abdomen could be colitis or diverticulitis.   Blood is being passed in stools or vomit (bright red or black tarry stools)   You develop chest pain, difficulty breathing, dizziness or fainting, or become confused, poorly responsive, or inconsolable (young children)  If you have any other emergent concerns regarding your health  Your vital signs today were: BP 110/70 (BP Location: Right Arm)    Pulse 71    Temp 98.8 F (37.1 C) (Oral)    Resp 18    Ht 5\' 11"  (1.803 m)    Wt 122.5 kg    LMP 06/05/2018    SpO2 99%    BMI 37.66 kg/m  If your blood pressure (bp) was elevated above 135/85 this visit, please have this  repeated by your doctor within one month. --------------

## 2018-06-22 NOTE — Telephone Encounter (Signed)
Sam, (flow) at [facility] at [facility] notified of patient's c/o. Advised that patient be seen in ED. Pt request a call back from her provider when notified of advice.

## 2018-06-22 NOTE — ED Triage Notes (Signed)
Patient with history of NASH cirrhosis of the liver presents with hematemesis intermittently x3 months. Patient reports emesis primarily in the morning, when she wakes up. Patient reports taking antiacid PO BID. Patient reports abdominal pain 5/10, burning pain, and nausea. Patient reports taking zofran 8mg  tab this morning for nausea relief.Patient denies blood thinners. VSS.

## 2018-06-23 DIAGNOSIS — F332 Major depressive disorder, recurrent severe without psychotic features: Secondary | ICD-10-CM | POA: Diagnosis not present

## 2018-06-24 DIAGNOSIS — F332 Major depressive disorder, recurrent severe without psychotic features: Secondary | ICD-10-CM | POA: Diagnosis not present

## 2018-06-24 NOTE — Progress Notes (Signed)
[name] [address], [place] [address] Phone: [phone] Subjective:   Fontaine No, am serving as a scribe for Dr. Hulan Saas.  I'm seeing this patient by the request  of:  Lance Sell, NP  CC: left hip pain   [id number]  Tracy [name] is a 52 y.o. female coming in with complaint of hip pain. She has stiffness and pain with walking. Did have an achilles rupture on right foot 6 years ago where she had to wear a boot for a long period of time. Hip pain is preventing her from being able to walk and she is trying to lose weight. Patient is unable to FABER with her hip. Pain is sharp and dull at times. Has not tried any therapies for hip. She did have an injection years ago but that did not help alleviate her pain.      Past Medical History:  Diagnosis Date  . Anxiety   . Arthritis    back, knees, right elbow  . Bipolar disorder ([place])   . Cirrhosis ([place])   . Dental crowns present   . Depression   . Diabetes mellitus without complication ([place])   . GERD (gastroesophageal reflux disease)   . High cholesterol    no current med.  Marland Kitchen History of kidney stones   . History of migraine headaches   . Hypertension    under control with meds., has been on med. x 4 yr.  . Hypothyroidism   . NASH (nonalcoholic steatohepatitis)   . PCOS (polycystic ovarian syndrome)   . PONV (postoperative nausea and vomiting)    also hx. of emergence delirium 2007  . Sleep apnea    uses CPAP nightly  . Trigeminal neuralgia    Past Surgical History:  Procedure Laterality Date  . ACHILLES TENDON SURGERY Left 2007  . APPENDECTOMY    . CARPAL TUNNEL RELEASE Right 01/12/2016   Procedure: RIGHT CARPAL TUNNEL RELEASE;  Surgeon: Roseanne Kaufman, MD;  Location: [facility];  Service: Orthopedics;  Laterality: Right;  . COLONOSCOPY WITH PROPOFOL N/A 04/20/2014   Procedure: COLONOSCOPY WITH PROPOFOL;  Surgeon: Arta Silence, MD;   Location: [facility];  Service: Endoscopy;  Laterality: N/A;  . ESOPHAGOGASTRODUODENOSCOPY (EGD) WITH PROPOFOL N/A 04/20/2014   Procedure: ESOPHAGOGASTRODUODENOSCOPY (EGD) WITH PROPOFOL;  Surgeon: Arta Silence, MD;  Location: [facility];  Service: Endoscopy;  Laterality: N/A;  . EXCISION HAGLUND'S DEFORMITY WITH ACHILLES TENDON REPAIR Right 02/25/2013   Procedure: RIGHT ACHILLES DEBRIDEMENT AND RECONSTRUCTION;  HAGLUND'S EXCISION; GASTROC RECESSION AND FLEXOR HALLUCIS LONGUS TRANSFER;  Surgeon: Wylene Simmer, MD;  Location: [facility];  Service: Orthopedics;  Laterality: Right;  . LUMBAR LAMINECTOMY     X 3  . neck fusion    . OVARIAN CYST SURGERY Left 1998  . PILONIDAL CYST EXCISION  1994  . RIGHT OOPHORECTOMY    . STERIOD INJECTION Left 01/12/2016   Procedure: STEROID INJECTION LEFT WRIST;  Surgeon: Roseanne Kaufman, MD;  Location: [facility];  Service: Orthopedics;  Laterality: Left;  . TOTAL THYROIDECTOMY  2003  . UPPER GI ENDOSCOPY  01/19/2015   Social History   Socioeconomic History  . Marital status: Married    Spouse name: Not on file  . Number of children: 0  . Years of education: 73  . Highest education level: Not on file  Occupational History  . 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 Financial resource strain: Not on  file  . Food insecurity:    Worry: Not on file    Inability: Not on file  . Transportation needs:    Medical: Not on file    Non-medical: Not on file  Tobacco Use  . Smoking status: Current Every Day Smoker    Packs/day: 0.50    Years: 15.00    Pack years: 7.50    Types: Cigarettes  . Smokeless tobacco: Never Used  Substance and Sexual Activity  . Alcohol use: No    Alcohol/week: 0.0 standard drinks    Comment: Has not had any alcohol since 07/2014 - before this date, she rarely drink.  . Drug use: No  . Sexual activity: Never    Partners: Male  Lifestyle  . Physical activity:    Days per week: Not on file    Minutes  per session: Not on file  . Stress: Not on file  Relationships  . Social connections:    Talks on phone: Not on file    Gets together: Not on file    Attends religious service: Not on file    Active member of club or organization: Not on file    Attends meetings of clubs or organizations: Not on file    Relationship status: Not on file  Other Topics Concern  . Not on file  Social History Narrative   RN - [employer]   Regular exercise: no   Caffeine use: 2 x daily   Right-handed.   Lives alone.   Allergies  Allergen Reactions  . Sulfa Antibiotics Itching, Swelling and Rash  . Bupropion Hcl Itching and Rash  . Codeine Itching  . Sulfamethoxazole-Trimethoprim Rash   Family History  Problem Relation Age of Onset  . Heart disease Father   . Depression Father   . Cancer Brother        THROAT  . Depression Brother   . Anxiety disorder Brother   . Depression Sister   . Breast cancer Mother   . Hypertension Mother     Current Outpatient Medications (Endocrine & Metabolic):  .  levothyroxine (SYNTHROID, LEVOTHROID) 200 MCG tablet, Take 200 mcg by mouth See admin instructions. Takes 273m on Even Days of the month .  levothyroxine (SYNTHROID, LEVOTHROID) 300 MCG tablet, Take 300 mcg by mouth See admin instructions. Takes 30468mon Odd Days of the month  Current Outpatient Medications (Cardiovascular):  .  lisinopril-hydrochlorothiazide (PRINZIDE,ZESTORETIC) 10-12.5 MG tablet, Take 1 tablet by mouth every morning.   Current Outpatient Medications (Analgesics):  .  ibuprofen (ADVIL,MOTRIN) 600 MG tablet, Take 600 mg by mouth 3 (three) times daily.  .  rizatriptan (MAXALT) 10 MG tablet, Take 1 tablet (10 mg total) by mouth as needed for migraine. May repeat in 2 hours if needed   Current Outpatient Medications (Other):  .  Blood Glucose Monitoring Suppl (FREESTYLE FREEDOM LITE) w/Device KIT, USE TO CHECK GLUCOSE .  clonazePAM (KLONOPIN) 1 MG tablet, Take 1 mg by mouth 3  (three) times daily. Take 0.68m104mwice daily and then take 1 mg at bedtime .  FLUoxetine (PROZAC) 20 MG tablet, Take 20 mg by mouth every morning. .  lamoTRIgine (LAMICTAL) 200 MG tablet, Take 400 mg by mouth daily.  .  Marland Kitchenmeprazole (PRILOSEC) 20 MG capsule, Take 1 capsule (20 mg total) by mouth daily. .  ondansetron (ZOFRAN-ODT) 4 MG disintegrating tablet, Take 4 mg by mouth every 4 (four) hours as needed for nausea or vomiting.  .  ranitidine (ZANTAC) 150 MG tablet,  Take 1 tablet by mouth 2 times daily .  Vitamin D, Ergocalciferol, (DRISDOL) 50000 units CAPS capsule, Take 1 capsule (50,000 Units total) by mouth every 7 (seven) days. .  Diclofenac Sodium 2 % SOLN, Place 2 g onto the skin 2 (two) times daily. Marland Kitchen  gabapentin (NEURONTIN) 100 MG capsule, Take 2 capsules (200 mg total) by mouth at bedtime.    Past medical history, social, surgical and family history all reviewed in electronic medical record.  No pertanent information unless stated regarding to the chief complaint.   Review of Systems:  No headache, visual changes, nausea, vomiting, diarrhea, constipation, dizziness, abdominal pain, skin rash, fevers, chills, night sweats, weight loss, swollen lymph nodes, body aches, joint swelling, chest pain, shortness of breath, mood changes.  Positive muscle aches  Objective  Blood pressure (!) 98/48, pulse 81, height 5' 11" (1.803 m), weight 273 lb (123.8 kg), last menstrual period 06/05/2018, SpO2 97 %.    General: No apparent distress alert and oriented x3 mood and affect normal, dressed appropriately.  HEENT: Pupils equal, extraocular movements intact  Respiratory: Patient's speak in full sentences and does not appear short of breath  Cardiovascular: No lower extremity edema, non tender, no erythema  Skin: Warm dry intact with no signs of infection or rash on extremities or on axial skeleton.  Abdomen: Soft nontender  Neuro: Cranial nerves II through XII are intact, neurovascularly intact  in all extremities with 2+ DTRs and 2+ pulses.  Lymph: No lymphadenopathy of posterior or anterior cervical chain or axillae bilaterally.  Gait using a more antalgic MSK:  tender with full range of motion and good stability and symmetric strength and tone of shoulders, elbows, wrist, knee and ankles bilaterally.  Left hip exam does have decreased range of motion in all planes.  Patient has less than 5 degrees of internal range of motion.  Patient has flexion to 100 degrees.  Extension less than 5 degrees  [id number]; 15 additional minutes spent for Therapeutic exercises as stated in above notes.  This included exercises focusing on stretching, strengthening, with significant focus on eccentric aspects.   Long term goals include an improvement in range of motion, strength, endurance as well as avoiding reinjury. Patient's frequency would include in 1-2 times a day, 3-5 times a week for a duration of 6-12 weeks. Hip strengthening exercises which included:  Pelvic tilt/bracing to help with proper recruitment of the lower abs and pelvic floor muscles  Glute strengthening to properly contract glutes without over-engaging low back and hamstrings - prone hip extension and glute bridge exercises Proper stretching techniques to increase effectiveness for the hip flexors, groin, quads, piriformic and low back when appropriate   Proper technique shown and discussed handout in great detail with ATC.  All questions were discussed and answered.      Impression and Recommendations:      The above documentation has been reviewed and is accurate and complete Lyndal Pulley, DO       Note: This dictation was prepared with Dragon dictation along with smaller phrase technology. Any transcriptional errors that result from this process are unintentional.

## 2018-06-25 ENCOUNTER — Ambulatory Visit: Payer: Self-pay | Admitting: Family Medicine

## 2018-06-25 ENCOUNTER — Ambulatory Visit (INDEPENDENT_AMBULATORY_CARE_PROVIDER_SITE_OTHER): Payer: 59 | Admitting: "Endocrinology

## 2018-06-25 ENCOUNTER — Ambulatory Visit: Payer: 59 | Admitting: Family Medicine

## 2018-06-25 ENCOUNTER — Encounter (INDEPENDENT_AMBULATORY_CARE_PROVIDER_SITE_OTHER): Payer: Self-pay | Admitting: "Endocrinology

## 2018-06-25 ENCOUNTER — Encounter: Payer: Self-pay | Admitting: Family Medicine

## 2018-06-25 ENCOUNTER — Ambulatory Visit (INDEPENDENT_AMBULATORY_CARE_PROVIDER_SITE_OTHER)
Admission: RE | Admit: 2018-06-25 | Discharge: 2018-06-25 | Disposition: A | Discharge status: Home / Self Care | Payer: 59 | Source: Ambulatory Visit | Attending: Family Medicine | Admitting: Family Medicine

## 2018-06-25 VITALS — BP 118/72 | HR 72 | Ht 71.0 in | Wt 269.8 lb

## 2018-06-25 VITALS — BP 98/48 | HR 81 | Ht 71.0 in | Wt 273.0 lb

## 2018-06-25 DIAGNOSIS — E1165 Type 2 diabetes mellitus with hyperglycemia: Secondary | ICD-10-CM | POA: Diagnosis not present

## 2018-06-25 DIAGNOSIS — M25552 Pain in left hip: Secondary | ICD-10-CM | POA: Diagnosis not present

## 2018-06-25 DIAGNOSIS — E890 Postprocedural hypothyroidism: Secondary | ICD-10-CM

## 2018-06-25 DIAGNOSIS — I10 Essential (primary) hypertension: Secondary | ICD-10-CM

## 2018-06-25 DIAGNOSIS — E11649 Type 2 diabetes mellitus with hypoglycemia without coma: Secondary | ICD-10-CM | POA: Diagnosis not present

## 2018-06-25 DIAGNOSIS — R600 Localized edema: Secondary | ICD-10-CM

## 2018-06-25 DIAGNOSIS — F329 Major depressive disorder, single episode, unspecified: Secondary | ICD-10-CM

## 2018-06-25 DIAGNOSIS — M1612 Unilateral primary osteoarthritis, left hip: Secondary | ICD-10-CM | POA: Diagnosis not present

## 2018-06-25 DIAGNOSIS — IMO0001 Reserved for inherently not codable concepts without codable children: Secondary | ICD-10-CM

## 2018-06-25 DIAGNOSIS — F419 Anxiety disorder, unspecified: Secondary | ICD-10-CM | POA: Diagnosis not present

## 2018-06-25 DIAGNOSIS — E559 Vitamin D deficiency, unspecified: Secondary | ICD-10-CM | POA: Diagnosis not present

## 2018-06-25 DIAGNOSIS — K760 Fatty (change of) liver, not elsewhere classified: Secondary | ICD-10-CM

## 2018-06-25 DIAGNOSIS — R5383 Other fatigue: Secondary | ICD-10-CM

## 2018-06-25 DIAGNOSIS — F332 Major depressive disorder, recurrent severe without psychotic features: Secondary | ICD-10-CM | POA: Diagnosis not present

## 2018-06-25 DIAGNOSIS — E1042 Type 1 diabetes mellitus with diabetic polyneuropathy: Secondary | ICD-10-CM

## 2018-06-25 DIAGNOSIS — R1013 Epigastric pain: Secondary | ICD-10-CM

## 2018-06-25 HISTORY — DX: Unilateral primary osteoarthritis, left hip: M16.12

## 2018-06-25 LAB — POCT GLYCOSYLATED HEMOGLOBIN (HGB A1C): Hemoglobin A1C: 5 % (ref 4.0–5.6)

## 2018-06-25 LAB — POCT GLUCOSE (DEVICE FOR HOME USE): POC GLUCOSE: 84 mg/dL (ref 70–99)

## 2018-06-25 MED ORDER — DICLOFENAC SODIUM 2 % TD SOLN: 2.0000 g | Freq: Two times a day (BID) | TRANSDERMAL | 3 refills | Status: DC

## 2018-06-25 MED ORDER — GABAPENTIN 100 MG PO CAPS: 200.0000 mg | ORAL_CAPSULE | Freq: Every day | ORAL | 3 refills | Status: DC

## 2018-06-25 NOTE — Patient Instructions (Signed)
Great to see you  Have a great [place] day  Ice 20 minutes 2 times daily. Usually after activity and before bed. Exercises 3 times a week.  pennsaid pinkie amount topically 2 times daily as needed.  Gabapentin 200mg  at night to help with sleep and can even go down to 100mg  at night  Xrays downstairs Keep working on the weight- you are doing great  Consider pool therapy or swimming for low impact exercises  See me again in 4 weeks

## 2018-06-25 NOTE — Patient Instructions (Signed)
Follow up visit in 3 months. 

## 2018-06-25 NOTE — Progress Notes (Signed)
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 generic LT4 had been a major contributor to these fluctuations, so I converted her to brand Synthroid.    B. Her TFTs in March 2017 were much better on her higher doses of Synthroid. Unfortunately, she had inadvertently decreased her dose since then.   C. Her TFTs in June 2018 were somewhat low. Unfortunately she had decreased her Synthroid dose more than I wanted her to do. Her TFTS in January 2019 were mid-euthyroid.   D. Since losing weight she may not need as much Synthroid now.  9. Anxiety and depression, chronic/BPD: These problems are partly familial and partly situational. She is doing much better in terms of depression today, but is still dealing with anxiety.  10. Hirsutism: Her hirsutism is likely due to a combination of both hyperandrogenism and increased skin sensitivity to androgens. She has previously been interested in trying spironolactone, but I do not want to start any new medications until she is doing better psychologically and medically.   11-13. PCOS/Oligomenorrhea/Infertility:  A. As above. Her oligomenorrhea comes and goes. Her periods had become irregular again, which could have been due to  worsening PCOS, but could also be due to her age and perimenopausal status. Her serum testosterone was elevated in September, but at the lower end of the range that we usually see in PCOS. Her androstenedione and DHEAS were well within the normal ranges.   B. 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 This degree of oligomenorrhea and infertility is common in women with severe PCOS/SLS.   C. What I don't understand, however, is why she suddenly began having regular menses in January 2016, during the same time period that her BGs suddenly came under control. While I doubt that starting C-pap treatment produced these dramatic improvements, I don't have another explanation. I wonder if she had been hypercortisolemic in the past. If she had had severe, chronic Cushing's disease, we should have seen severe hyperpigmentation, which has not been present. If she had had severe, chronic Cushing's syndrome due to autonomous adrenal nodules, I would not expect a sudden reversal. If she had had a cortisol-producing mass lesion, it should have shown up on CT of her abdomen, but did not. If she had had a severe prolactinoma as the cause of  her oligomenorrhea, her prolactin should have been elevated and the tumor should have been a macroprolactinoma and should have been visible on prior images of the brain. A macroprolactinoma, however, would not usually have contributed to T2DM.   D. When she lost weight, her oligomenorrhea improved. She had been having menses almost regularly for the past year prior to her last visit. However, when she regained weight, the oligomenorrhea worsened and she skipped periods in May, in July, and in November 2018. Then when she lost more weight this year, her periods resumed.   E. 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 USE PROTECTION.  14. Fatigue: She has less fatigue now.  15. Edema: This problem is not evident today.   16. Tachycardia: She is not tachycardic today. 17. Tinea pedis: Her tinea pedis is worse today. She needs to resume using ketoconazole.  PLAN:  1. Diagnostic: Check BGs several times per week, sometimes at breakfast and sometimes at dinner. Check TFTs, Vitamin D now. 2. Therapeutic: Continue ketoconazole 2% cream, twice daily on her feet. Continue Synthroid doses of 300 mcg/day on 4 days each week and 200 mcg/day on three days each week. Discontinue the Lantus dose of 38 units each evening. Continue ranitidine, but stop if on the Sandoz brand. Continue Prilosec. . Eat Right Diet. Exercise daily, with a goal of gradually increasing her duration of exercise to at least one hour per day. Consider adding spironolactone. 3. Patient education: See Tracy Matilde Sprang and Tracy Dorethea Clan regularly. We discussed all of the above at great length. We discussed her post-surgical hypothyroidism, T2DM, hypertension, and dyspepsia, to include the need for her to take her medications for these problems consistently and reliably. 4. Follow-up: 3 months  Level of Service: This visit lasted in excess of 70 minutes. More than 50% of the visit was devoted to counseling.  Tillman Sers, MD, CDE Adult and Pediatric Endocrinology

## 2018-06-25 NOTE — Assessment & Plan Note (Signed)
Concerned due to the severe lack of range of motion of the hip that this could be contributing to the early arthritis.  My guess is that patient does have moderate to severe arthritis.  X-rays are pending.  Home exercise given, topical anti-inflammatories, exercises to avoid.  Discussed lower impact cardio type exercises that could be beneficial, but encouraged continued weight loss.  Follow-up again with me 4 to 6 weeks.  Could be a candidate for injections.

## 2018-06-26 DIAGNOSIS — F332 Major depressive disorder, recurrent severe without psychotic features: Secondary | ICD-10-CM | POA: Diagnosis not present

## 2018-06-26 LAB — C-PEPTIDE: C PEPTIDE: 4.06 ng/mL — AB (ref 0.80–3.85)

## 2018-06-26 LAB — T3, FREE: T3, Free: 2.8 pg/mL (ref 2.3–4.2)

## 2018-06-26 LAB — VITAMIN D 25 HYDROXY (VIT D DEFICIENCY, FRACTURES): Vit D, 25-Hydroxy: 36 ng/mL (ref 30–100)

## 2018-06-26 LAB — TSH: TSH: 0.6 m[IU]/L

## 2018-06-26 LAB — T4, FREE: Free T4: 1.4 ng/dL (ref 0.8–1.8)

## 2018-06-29 DIAGNOSIS — F332 Major depressive disorder, recurrent severe without psychotic features: Secondary | ICD-10-CM | POA: Diagnosis not present

## 2018-06-30 DIAGNOSIS — F332 Major depressive disorder, recurrent severe without psychotic features: Secondary | ICD-10-CM | POA: Diagnosis not present

## 2018-06-30 DIAGNOSIS — F3181 Bipolar II disorder: Secondary | ICD-10-CM | POA: Diagnosis not present

## 2018-07-01 ENCOUNTER — Encounter (INDEPENDENT_AMBULATORY_CARE_PROVIDER_SITE_OTHER): Payer: Self-pay | Admitting: *Deleted

## 2018-07-03 DIAGNOSIS — F332 Major depressive disorder, recurrent severe without psychotic features: Secondary | ICD-10-CM | POA: Diagnosis not present

## 2018-07-06 DIAGNOSIS — F332 Major depressive disorder, recurrent severe without psychotic features: Secondary | ICD-10-CM | POA: Diagnosis not present

## 2018-07-07 DIAGNOSIS — F332 Major depressive disorder, recurrent severe without psychotic features: Secondary | ICD-10-CM | POA: Diagnosis not present

## 2018-07-08 DIAGNOSIS — F332 Major depressive disorder, recurrent severe without psychotic features: Secondary | ICD-10-CM | POA: Diagnosis not present

## 2018-07-09 DIAGNOSIS — F332 Major depressive disorder, recurrent severe without psychotic features: Secondary | ICD-10-CM | POA: Diagnosis not present

## 2018-07-10 DIAGNOSIS — F332 Major depressive disorder, recurrent severe without psychotic features: Secondary | ICD-10-CM | POA: Diagnosis not present

## 2018-07-13 DIAGNOSIS — F332 Major depressive disorder, recurrent severe without psychotic features: Secondary | ICD-10-CM | POA: Diagnosis not present

## 2018-07-14 DIAGNOSIS — F3181 Bipolar II disorder: Secondary | ICD-10-CM | POA: Diagnosis not present

## 2018-07-23 DIAGNOSIS — G4733 Obstructive sleep apnea (adult) (pediatric): Secondary | ICD-10-CM | POA: Diagnosis not present

## 2018-07-23 NOTE — Progress Notes (Signed)
[name] [address] [place], [place] [address] Phone: [phone] Subjective:     CC: Left hip pain follow-up  TSV:[id number]  Tracy [name] is a 52 y.o. female coming in with complaint of left hip pain. Patient feels like her pain is worse than last visit. Patient was having home PT and she couldn't perform all of the exercises due to pain. She has been using pennsaid Deep knee bending increases her pain as well as trunk flexion when putting her shoes on. She does ice at night.  X-rays that were taken left arm were independently visualized by me showing moderate osteoarthritic changes    Past Medical History:  Diagnosis Date  . Anxiety   . Arthritis    back, knees, right elbow  . Bipolar disorder ([place])   . Cirrhosis ([place])   . Dental crowns present   . Depression   . Diabetes mellitus without complication ([place])   . GERD (gastroesophageal reflux disease)   . High cholesterol    no current med.  Marland Kitchen History of kidney stones   . History of migraine headaches   . Hypertension    under control with meds., has been on med. x 4 yr.  . Hypothyroidism   . NASH (nonalcoholic steatohepatitis)   . PCOS (polycystic ovarian syndrome)   . PONV (postoperative nausea and vomiting)    also hx. of emergence delirium 2007  . Sleep apnea    uses CPAP nightly  . Trigeminal neuralgia    Past Surgical History:  Procedure Laterality Date  . ACHILLES TENDON SURGERY Left 2007  . APPENDECTOMY    . CARPAL TUNNEL RELEASE Right 01/12/2016   Procedure: RIGHT CARPAL TUNNEL RELEASE;  Surgeon: Roseanne Kaufman, MD;  Location: [facility];  Service: Orthopedics;  Laterality: Right;  . COLONOSCOPY WITH PROPOFOL N/A 04/20/2014   Procedure: COLONOSCOPY WITH PROPOFOL;  Surgeon: Arta Silence, MD;  Location: [facility];  Service: Endoscopy;  Laterality: N/A;  . ESOPHAGOGASTRODUODENOSCOPY (EGD) WITH PROPOFOL N/A 04/20/2014   Procedure:  ESOPHAGOGASTRODUODENOSCOPY (EGD) WITH PROPOFOL;  Surgeon: Arta Silence, MD;  Location: [facility];  Service: Endoscopy;  Laterality: N/A;  . EXCISION HAGLUND'S DEFORMITY WITH ACHILLES TENDON REPAIR Right 02/25/2013   Procedure: RIGHT ACHILLES DEBRIDEMENT AND RECONSTRUCTION;  HAGLUND'S EXCISION; GASTROC RECESSION AND FLEXOR HALLUCIS LONGUS TRANSFER;  Surgeon: Wylene Simmer, MD;  Location: [facility];  Service: Orthopedics;  Laterality: Right;  . LUMBAR LAMINECTOMY     X 3  . neck fusion    . OVARIAN CYST SURGERY Left 1998  . PILONIDAL CYST EXCISION  1994  . RIGHT OOPHORECTOMY    . STERIOD INJECTION Left 01/12/2016   Procedure: STEROID INJECTION LEFT WRIST;  Surgeon: Roseanne Kaufman, MD;  Location: [facility];  Service: Orthopedics;  Laterality: Left;  . TOTAL THYROIDECTOMY  2003  . UPPER GI ENDOSCOPY  01/19/2015   Social History   Socioeconomic History  . Marital status: Married    Spouse name: Not on file  . Number of children: 0  . Years of education: 81  . Highest education level: Not on file  Occupational History  . 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 Financial resource strain: Not on file  . Food insecurity:    Worry: Not on file    Inability: Not on file  . Transportation needs:    Medical: Not on file    Non-medical: Not on file  Tobacco Use  . Smoking status:  Current Every Day Smoker    Packs/day: 0.50    Years: 15.00    Pack years: 7.50    Types: Cigarettes  . Smokeless tobacco: Never Used  Substance and Sexual Activity  . Alcohol use: No    Alcohol/week: 0.0 standard drinks    Comment: Has not had any alcohol since 07/2014 - before this date, she rarely drink.  . Drug use: No  . Sexual activity: Never    Partners: Male  Lifestyle  . Physical activity:    Days per week: Not on file    Minutes per session: Not on file  . Stress: Not on file  Relationships  . Social connections:    Talks on phone: Not on file    Gets together:  Not on file    Attends religious service: Not on file    Active member of club or organization: Not on file    Attends meetings of clubs or organizations: Not on file    Relationship status: Not on file  Other Topics Concern  . Not on file  Social History Narrative   RN - [employer]   Regular exercise: no   Caffeine use: 2 x daily   Right-handed.   Lives alone.   Allergies  Allergen Reactions  . Sulfa Antibiotics Itching, Swelling and Rash  . Bupropion Hcl Itching and Rash  . Codeine Itching  . Sulfamethoxazole-Trimethoprim Rash   Family History  Problem Relation Age of Onset  . Heart disease Father   . Depression Father   . Cancer Brother        THROAT  . Depression Brother   . Anxiety disorder Brother   . Depression Sister   . Breast cancer Mother   . Hypertension Mother     Current Outpatient Medications (Endocrine & Metabolic):  .  levothyroxine (SYNTHROID, LEVOTHROID) 200 MCG tablet, Take 200 mcg by mouth See admin instructions. Takes 253m on Even Days of the month .  levothyroxine (SYNTHROID, LEVOTHROID) 300 MCG tablet, Take 300 mcg by mouth See admin instructions. Takes 3034mon Odd Days of the month  Current Outpatient Medications (Cardiovascular):  .  lisinopril-hydrochlorothiazide (PRINZIDE,ZESTORETIC) 10-12.5 MG tablet, Take 1 tablet by mouth every morning.   Current Outpatient Medications (Analgesics):  .  ibuprofen (ADVIL,MOTRIN) 600 MG tablet, Take 600 mg by mouth 3 (three) times daily.  .  rizatriptan (MAXALT) 10 MG tablet, Take 1 tablet (10 mg total) by mouth as needed for migraine. May repeat in 2 hours if needed   Current Outpatient Medications (Other):  .  Blood Glucose Monitoring Suppl (FREESTYLE FREEDOM LITE) w/Device KIT, USE TO CHECK GLUCOSE .  clonazePAM (KLONOPIN) 1 MG tablet, Take 1 mg by mouth 3 (three) times daily. Take 0.13m313mwice daily and then take 1 mg at bedtime .  Diclofenac Sodium 2 % SOLN, Place 2 g onto the skin 2 (two) times  daily. .  Marland KitchenLUoxetine (PROZAC) 20 MG tablet, Take 20 mg by mouth every morning. .  gabapentin (NEURONTIN) 100 MG capsule, Take 2 capsules (200 mg total) by mouth at bedtime. .  lamoTRIgine (LAMICTAL) 200 MG tablet, Take 400 mg by mouth daily.  .  Marland Kitchenmeprazole (PRILOSEC) 20 MG capsule, Take 1 capsule (20 mg total) by mouth daily. .  ondansetron (ZOFRAN-ODT) 4 MG disintegrating tablet, Take 4 mg by mouth every 4 (four) hours as needed for nausea or vomiting.  .  ranitidine (ZANTAC) 150 MG tablet, Take 1 tablet by mouth 2 times daily .  Vitamin D, Ergocalciferol, (DRISDOL) 50000 units CAPS capsule, Take 1 capsule (50,000 Units total) by mouth every 7 (seven) days.    Past medical history, social, surgical and family history all reviewed in electronic medical record.  No pertanent information unless stated regarding to the chief complaint.   Review of Systems:  No headache, visual changes, nausea, vomiting, diarrhea, constipation, dizziness, abdominal pain, skin rash, fevers, chills, night sweats, weight loss, swollen lymph nodes, body aches, joint swelling, chest pain, shortness of breath, mood changes.  Positive muscle aches  Objective  Blood pressure 122/80, pulse 77, height 5' 11"  (1.803 m), weight 263 lb (119.3 kg), SpO2 98 %.    General: No apparent distress alert and oriented x3 mood and affect normal, dressed appropriately.  HEENT: Pupils equal, extraocular movements intact  Respiratory: Patient's speak in full sentences and does not appear short of breath  Cardiovascular: No lower extremity edema, non tender, no erythema  Skin: Warm dry intact with no signs of infection or rash on extremities or on axial skeleton.  Abdomen: Soft nontender  Neuro: Cranial nerves II through XII are intact, neurovascularly intact in all extremities with 2+ DTRs and 2+ pulses.  Lymph: No lymphadenopathy of posterior or anterior cervical chain or axillae bilaterally.  Gait antalgic gait MSK:  Non tender  with full range of motion and good stability and symmetric strength and tone of shoulders, elbows, wrist, , knee and ankles bilaterally.  Patient is a left hip does have limited range of motion in all planes.  Patient treatment does have some limited range of motion as well.  More tenderness over the lateral aspect of the hip compared to contralateral side.  Pain with internal range of motion as well.   Procedure: Real-time Ultrasound Guided Injection of left  greater trochanteric bursitis secondary to patient's body habitus Device: GE Logiq Q7  Ultrasound guided injection is preferred based studies that show increased duration, increased effect, greater accuracy, decreased procedural pain, increased response rate, and decreased cost with ultrasound guided versus blind injection.  Verbal informed consent obtained.  Time-out conducted.  Noted no overlying erythema, induration, or other signs of local infection.  Skin prepped in a sterile fashion.  Local anesthesia: Topical Ethyl chloride.  With sterile technique and under real time ultrasound guidance:  Greater trochanteric area was visualized and patient's bursa was noted. A 22-gauge 3 inch needle was inserted and 4 cc of 0.5% Marcaine and 1 cc of Kenalog 40 mg/dL was injected. Pictures taken Completed without difficulty  Pain immediately resolved suggesting accurate placement of the medication.  Advised to call if fevers/chills, erythema, induration, drainage, or persistent bleeding.  Images permanently stored and available for review in the ultrasound unit.  Impression: Technically successful ultrasound guided injection.   Impression and Recommendations:      The above documentation has been reviewed and is accurate and complete Tracy Pulley, DO       Note: This dictation was prepared with Dragon dictation along with smaller phrase technology. Any transcriptional errors that result from this process are unintentional.

## 2018-07-24 ENCOUNTER — Ambulatory Visit: Payer: Self-pay

## 2018-07-24 ENCOUNTER — Encounter: Payer: Self-pay | Admitting: Family Medicine

## 2018-07-24 ENCOUNTER — Ambulatory Visit: Payer: 59 | Admitting: Family Medicine

## 2018-07-24 VITALS — BP 122/80 | HR 77 | Ht 71.0 in | Wt 263.0 lb

## 2018-07-24 DIAGNOSIS — M25552 Pain in left hip: Secondary | ICD-10-CM

## 2018-07-24 DIAGNOSIS — M7062 Trochanteric bursitis, left hip: Secondary | ICD-10-CM | POA: Insufficient documentation

## 2018-07-24 DIAGNOSIS — M160 Bilateral primary osteoarthritis of hip: Secondary | ICD-10-CM

## 2018-07-24 DIAGNOSIS — M1612 Unilateral primary osteoarthritis, left hip: Secondary | ICD-10-CM

## 2018-07-24 HISTORY — DX: Trochanteric bursitis, left hip: M70.62

## 2018-07-24 NOTE — Patient Instructions (Signed)
Good to see you  Keep up everything else See me again in 4ish weeks PT will be calling you

## 2018-07-24 NOTE — Assessment & Plan Note (Signed)
Left greater trochanteric bursitis.  Discussed icing regimen and home exercises.  Given injection today.  Concern for more over the possibility of a hip arthritis as well.  We will discuss the possibility of a intra-articular injection if necessary.  Follow-up with me again in 3 to 4 weeks.

## 2018-07-24 NOTE — Assessment & Plan Note (Signed)
Arthritis noted.  Patient's pain seems to be more over the lateral aspect of the hip.  Attempted injection.  May need to consider the possibility of a injection as well.  Patient does not want any surgical intervention.  Follow-up again in 4 weeks after continued conservative therapy

## 2018-08-06 DIAGNOSIS — F3181 Bipolar II disorder: Secondary | ICD-10-CM | POA: Diagnosis not present

## 2018-08-19 DIAGNOSIS — F332 Major depressive disorder, recurrent severe without psychotic features: Secondary | ICD-10-CM | POA: Diagnosis not present

## 2018-08-21 ENCOUNTER — Ambulatory Visit: Payer: Self-pay | Admitting: Family Medicine

## 2018-08-28 ENCOUNTER — Other Ambulatory Visit (INDEPENDENT_AMBULATORY_CARE_PROVIDER_SITE_OTHER): Payer: Self-pay | Admitting: "Endocrinology

## 2018-09-01 ENCOUNTER — Other Ambulatory Visit: Payer: Self-pay

## 2018-09-01 ENCOUNTER — Encounter: Payer: Self-pay | Admitting: Physical Therapy

## 2018-09-01 ENCOUNTER — Ambulatory Visit: Payer: 59 | Attending: Family Medicine | Admitting: Physical Therapy

## 2018-09-01 DIAGNOSIS — M25551 Pain in right hip: Secondary | ICD-10-CM | POA: Diagnosis not present

## 2018-09-01 DIAGNOSIS — M25652 Stiffness of left hip, not elsewhere classified: Secondary | ICD-10-CM | POA: Insufficient documentation

## 2018-09-01 DIAGNOSIS — R262 Difficulty in walking, not elsewhere classified: Secondary | ICD-10-CM | POA: Diagnosis not present

## 2018-09-01 DIAGNOSIS — M25552 Pain in left hip: Secondary | ICD-10-CM | POA: Diagnosis not present

## 2018-09-01 DIAGNOSIS — M6281 Muscle weakness (generalized): Secondary | ICD-10-CM | POA: Insufficient documentation

## 2018-09-01 NOTE — Therapy (Signed)
[place], [place], [address] Phone: [phone]   Fax:  [phone]  Physical Therapy Evaluation  Patient Details  Name: Tracy [name] MRN: [id number] Date of Birth: 07-14-1966 Referring Provider (PT): Hulan Saas, DO   Encounter Date: 09/01/2018  PT End of Session - 09/01/18 2115    Visit Number  1    Number of Visits  12    Date for PT Re-Evaluation  10/13/18    Authorization Type  [facility] [employer]    PT Start Time  1632    PT Stop Time  1717    PT Time Calculation (min)  45 min    Activity Tolerance  Patient limited by pain    Behavior During Therapy  [facility] for tasks assessed/performed       Past Medical History:  Diagnosis Date  . Anxiety   . Arthritis    back, knees, right elbow  . Bipolar disorder ([place])   . Cirrhosis ([place])   . Dental crowns present   . Depression   . Diabetes mellitus without complication ([place])   . GERD (gastroesophageal reflux disease)   . High cholesterol    no current med.  Marland Kitchen History of kidney stones   . History of migraine headaches   . Hypertension    under control with meds., has been on med. x 4 yr.  . Hypothyroidism   . NASH (nonalcoholic steatohepatitis)   . PCOS (polycystic ovarian syndrome)   . PONV (postoperative nausea and vomiting)    also hx. of emergence delirium 2007  . Sleep apnea    uses CPAP nightly  . Trigeminal neuralgia     Past Surgical History:  Procedure Laterality Date  . ACHILLES TENDON SURGERY Left 2007  . APPENDECTOMY    . CARPAL TUNNEL RELEASE Right 01/12/2016   Procedure: RIGHT CARPAL TUNNEL RELEASE;  Surgeon: Roseanne Kaufman, MD;  Location: [facility];  Service: Orthopedics;  Laterality: Right;  . COLONOSCOPY WITH PROPOFOL N/A 04/20/2014   Procedure: COLONOSCOPY WITH PROPOFOL;  Surgeon: Arta Silence, MD;  Location: [facility];  Service: Endoscopy;  Laterality: N/A;  . ESOPHAGOGASTRODUODENOSCOPY (EGD) WITH  PROPOFOL N/A 04/20/2014   Procedure: ESOPHAGOGASTRODUODENOSCOPY (EGD) WITH PROPOFOL;  Surgeon: Arta Silence, MD;  Location: [facility];  Service: Endoscopy;  Laterality: N/A;  . EXCISION HAGLUND'S DEFORMITY WITH ACHILLES TENDON REPAIR Right 02/25/2013   Procedure: RIGHT ACHILLES DEBRIDEMENT AND RECONSTRUCTION;  HAGLUND'S EXCISION; GASTROC RECESSION AND FLEXOR HALLUCIS LONGUS TRANSFER;  Surgeon: Wylene Simmer, MD;  Location: [facility];  Service: Orthopedics;  Laterality: Right;  . LUMBAR LAMINECTOMY     X 3  . neck fusion    . OVARIAN CYST SURGERY Left 1998  . PILONIDAL CYST EXCISION  1994  . RIGHT OOPHORECTOMY    . STERIOD INJECTION Left 01/12/2016   Procedure: STEROID INJECTION LEFT WRIST;  Surgeon: Roseanne Kaufman, MD;  Location: [facility];  Service: Orthopedics;  Laterality: Left;  . TOTAL THYROIDECTOMY  2003  . UPPER GI ENDOSCOPY  01/19/2015    There were no vitals filed for this visit.   Subjective Assessment - 09/01/18 1742    Subjective  Pt. is a 53 y/o female referred to PT with primary complaint of left hip pain (PT prescription includes bilat. hips but left is most symptomatic). She reports multi-year history of symptoms dating back approximately 8 years. Pain was exacerbated years ago after wearing boot s/p Achilles surgery. X-rays revealed moderate OA bilat.  Pt. would like to avoid surgical intervention if possible. She has had 2 US guided injections but reports did not help with pain. Regions of pain include left anterior hip and groin region as well as left posterolateral hip and proximal IT band region. Pt. reports significant limitation of mobility and positonal tolerance due to hip pain and limitations associated with hip pain and stiffness, weakness.    Pertinent History  multi-year history symptoms, no significant improvement after injections, obesity, medical comorbidities    Limitations  Sitting;Lifting;Standing;Walking;House hold activities     How long can you sit comfortably?  40 minutes    How long can you stand comfortably?  unable comfortably    How long can you walk comfortably?  5 minutes    Diagnostic tests  X-rays    Patient Stated Goals  Walk without pain    Currently in Pain?  Yes    Pain Score  4     Pain Location  Hip    Pain Orientation  Left    Pain Descriptors / Indicators  Sharp;Burning    Pain Type  Chronic pain    Pain Radiating Towards  anterior hip/groin and posterolateral hip, proximal IT band region    Pain Onset  More than a month ago    Pain Frequency  Constant    Aggravating Factors   standing and walking    Pain Relieving Factors  medication, sitting is better than standing/walking but has pain also with prolonged sitting    Effect of Pain on Daily Activities  Limits tolerance for standing/walking for IADLs and community mobility, limited positional tolerance for sitting at work         [facility] PT Assessment - 09/01/18 0001      Assessment   Medical Diagnosis  Bilateral hip arthritis    Referring Provider (PT)  Hulan Saas, DO    Onset Date/Surgical Date  --   Referral 07/24/18, 6-8 year history symptoms   Hand Dominance  Right    Prior Therapy  none      Precautions   Precautions  None      Restrictions   Weight Bearing Restrictions  No      Balance Screen   Has the patient fallen in the past 6 months  No      [place] residence    Living Arrangements  Spouse/significant other    Type of [place] to enter    Entrance Stairs-Number of Steps  6    Home Layout  One level    Additional Comments  Has RW, SPC, bedside commode      Prior Function   Level of Independence  Independent      Cognition   Overall Cognitive Status  Within Functional Limits for tasks assessed      Observation/Other Assessments   Focus on Therapeutic Outcomes (FOTO)   73% limited      Sensation   Additional Comments  Pt. reports chronic  parasthesias right lateral thigh with history lumnbar radiculopathy/surgeries      Posture/Postural Control   Posture Comments  forward head, increased thoracic kyphosis      ROM / Strength   AROM / PROM / Strength  AROM;PROM;Strength      AROM   AROM Assessment Site  Hip    Right/Left Hip  Right;Left    Right Hip Extension  --   not tested-unable Lt  sidelying position, prone not attempted   Right Hip Flexion  120    Right Hip External Rotation   45    Right Hip Internal Rotation   12    Right Hip ABduction  40    Right Hip ADduction  --   WFL   Left Hip Extension  --   unable due to pain   Left Hip Flexion  90    Left Hip External Rotation   40    Left Hip Internal Rotation   0    Left Hip ABduction  35    Left Hip ADduction  --   [facility]   Right/Left Knee  Right;Left    Right Knee Extension  5    Right Knee Flexion  125    Left Knee Extension  10    Left Knee Flexion  130      PROM   Overall PROM Comments  Left hip PROM limited grossly consistent with AROM as noted-IR limited with capsular restriction, flexion limited due to pain, unable FABER position due to pain      Strength   Strength Assessment Site  Hip;Knee    Right/Left Hip  Right;Left    Right Hip Flexion  5/5    Right Hip External Rotation   4+/5    Right Hip Internal Rotation  5/5    Right Hip ABduction  --   not tested, unable to tolerate left sidelying   Left Hip Flexion  4-/5    Left Hip Extension  --   unable to test, prone not attempted   Left Hip External Rotation  4/5    Left Hip Internal Rotation  4/5    Left Hip ABduction  4-/5    Right/Left Knee  Right;Left    Right Knee Flexion  5/5    Right Knee Extension  5/5    Left Knee Flexion  4/5    Left Knee Extension  4+/5      Flexibility   Soft Tissue Assessment /Muscle Length  --   Lt. hamstring, IT band, and posterolateral hip tightness     Palpation   Palpation comment  No tenderness at left greater trochanter, tightness but minimal  tenderness to palpation left piriformis, gluteus medius, lateral glut max      Special Tests   Other special tests  unable FABER and quadrant tests due to pain, limited ROM                Objective measurements completed on examination: See above findings.      [facility] PT Treatment/Exercise - 09/01/18 0001      Exercises   Exercises  Knee/Hip      Knee/Hip Exercises: Stretches   Active Hamstring Stretch  --   instructed HEP seated stretch   Piriformis Stretch  --   HEP instruction   Other Knee/Hip Stretches  unable to tolerate SKTC stretch for hip flexion ROM due to pain      Knee/Hip Exercises: Seated   Long Arc Quad  Left;1 set;10 reps      Knee/Hip Exercises: Supine   Hip Adduction Isometric  Both;1 set;10 reps    Bridges  Both;10 reps    Other Supine Knee/Hip Exercises  clamshell red 1x10      Manual Therapy   Manual Therapy  Joint mobilization;Soft tissue mobilization    Joint Mobilization  Left hip long axis distraction grade I-III    Soft tissue mobilization  STM left piriformis,  gluteus medius region             PT Education - 09/01/18 2115    Education Details  HEP, eval findings, POC    Person(s) Educated  Patient    Methods  Explanation;Demonstration;Verbal cues;Handout    Comprehension  Verbal cues required;Verbalized understanding;Returned demonstration       PT Short Term Goals - 09/01/18 2127      PT SHORT TERM GOAL #1   Title  Independent with HEP    Baseline  no HEP    Time  3    Period  Weeks    Status  New    Target Date  09/22/18      PT SHORT TERM GOAL #2   Title  Increase left hip flexion AROM 10 deg or greater to improve ability to donn shoes and perform transfers from low seating    Baseline  90 deg    Time  3    Period  Weeks    Status  New    Target Date  09/22/18        PT Long Term Goals - 09/01/18 2129      PT LONG TERM GOAL #1   Title  Improve FOTO score to 54% or less impairment    Baseline  73%  limited    Time  6    Period  Weeks    Status  New    Target Date  10/13/18      PT LONG TERM GOAL #2   Title  Increase left hip and knee strength grossly 1/2 MMT or greater to improve ability for stair navigation, transfers from low chairs    Baseline  see flowsheet-4-/5 to 4/5    Time  6    Period  Weeks    Status  New    Target Date  10/13/18      PT LONG TERM GOAL #3   Title  Tolerate standing for 10-15 min for activities such as doing dishes, showering, IADLs    Baseline  difficulty/limited due to pain, limited <5 min    Time  6    Period  Weeks    Status  New    Target Date  10/13/18      PT LONG TERM GOAL #4   Title  Tolerate ambulation and periods 20-30 min for grocery shopping, IADLs    Baseline  difficulty walking >5 min due to pain    Time  6    Period  Weeks    Status  New    Target Date  10/13/18             Plan - 09/01/18 2116    Clinical Impression Statement  Pt. presents with left>right hip pain, decreased ROM with combination muscle tightness and capsular restriction of ROM, and muscle weakness with underlying OA. Moderate OA per imaging-pt. presents with significant functional + objective and subjective limitations. Anterior hip/groin pain consistent with OA. For posterolateral hip pain differential diagnosis could include bursitis, no tenderness at greater trochanter today-suspect portion of pain in this region could be associated with (atypical) pain referral pattern from OA. Plan trial PT to see if improvement can be obtained with conservative measures to address current functional limitations.    History and Personal Factors relevant to plan of care:  chronic duration of symptoms, no relief with injections, comorbidities, obesity    Clinical Presentation  Stable    Clinical Decision Making  Low  Rehab Potential  Fair    Clinical Impairments Affecting Rehab Potential  underlying OA, duration of symptoms, level of weakness and ROM limitation    PT  Frequency  2x / week    PT Duration  6 weeks    PT Treatment/Interventions  ADLs/Self Care Home Management;Cryotherapy;Ultrasound;Moist Heat;Iontophoresis 4mg /ml Dexamethasone;Electrical Stimulation;Therapeutic activities;Therapeutic exercise;Balance training;Gait training;Neuromuscular re-education;Functional mobility training;Manual techniques;Dry needling;Taping;Patient/family education;Passive range of motion    PT Next Visit Plan  NUSTEP warm-up, review HEP as needed, hip ROM, stretches, initial open-chain focused strengthening, joint mobs, stretches, modalities prn    PT Home Exercise Plan  hip bridge, hip add. isometric, clamshell, LAQ, hamstring and piriformis stretches    Consulted and Agree with Plan of Care  Patient       Patient will benefit from skilled therapeutic intervention in order to improve the following deficits and impairments:  Abnormal gait, Pain, Decreased activity tolerance, Decreased range of motion, Decreased strength, Hypomobility, Decreased endurance, Obesity, Impaired flexibility, Difficulty walking  Visit Diagnosis: Pain in left hip  Stiffness of left hip, not elsewhere classified  Pain in right hip  Muscle weakness (generalized)  Difficulty in walking, not elsewhere classified     Problem List Patient Active Problem List   Diagnosis Date Noted  . Greater trochanteric bursitis of left hip 07/24/2018  . Arthritis of left hip 06/25/2018  . Encounter for general adult medical examination with abnormal findings 01/04/2018  . Vitamin D deficiency 01/04/2018  . Lower back pain 07/18/2017  . Hemorrhoids, internal 07/18/2017  . Trigeminal neuralgia 01/18/2016  . Bipolar disorder ([place]) 11/28/2015  . Borderline personality disorder ([place]) 11/28/2015  . Carpal tunnel syndrome 09/06/2015  . MDD (major depressive disorder) 06/06/2015  . Severe episode of recurrent major depressive disorder, without psychotic features ([place])   . History of posttraumatic stress  disorder (PTSD)   . GAD (generalized anxiety disorder)   . Insulin resistance 02/09/2015  . 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 Acanthosis nigricans, acquired 02/09/2015  . Nonalcoholic fatty liver disease 02/09/2015  . Hepatic cirrhosis ([place]) 02/09/2015  . Female hirsutism 02/09/2015  . Oligomenorrhea 02/09/2015  . Infertility associated with anovulation 02/09/2015  . Morbid obesity ([place]) 10/31/2014  . Anxiety and depression 10/31/2014  . Obstructive sleep apnea 06/27/2014  . Diabetes mellitus, type 2 ([place]) 06/10/2013  . 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[place]) 01/01/2012  . Cystic teratoma   . PCOS (polycystic ovarian syndrome)   . Infertility, female   . TOBACCO USE, QUIT 02/08/2010  . HYPOTHYROIDISM, POSTSURGICAL 10/02/2007  . POLYCYSTIC OVARIAN DISEASE 10/02/2007  . Elevated cholesterol 10/02/2007  . DEPRESSION 10/02/2007  . Migraine 10/02/2007  . Essential hypertension 10/02/2007  . RENAL CALCULUS, HX OF 10/02/2007    Beaulah Dinning, PT, DPT 09/01/18 9:38 PM  [facility] [address] [place], [place], [address] Phone: [phone]   Fax:  [phone]  Name: Fumiye Lubben MRN: [id number] Date of Birth: [birth year]

## 2018-09-03 ENCOUNTER — Encounter: Payer: Self-pay | Admitting: Family Medicine

## 2018-09-03 ENCOUNTER — Other Ambulatory Visit: Payer: Self-pay | Admitting: Nurse Practitioner

## 2018-09-03 DIAGNOSIS — E559 Vitamin D deficiency, unspecified: Secondary | ICD-10-CM

## 2018-09-03 DIAGNOSIS — F419 Anxiety disorder, unspecified: Secondary | ICD-10-CM | POA: Diagnosis not present

## 2018-09-03 DIAGNOSIS — F331 Major depressive disorder, recurrent, moderate: Secondary | ICD-10-CM | POA: Diagnosis not present

## 2018-09-03 MED ORDER — MELOXICAM 15 MG PO TABS: 15.0000 mg | ORAL_TABLET | Freq: Every day | ORAL | 0 refills | Status: DC

## 2018-09-03 NOTE — Telephone Encounter (Signed)
pls advise on refill.../lmb 

## 2018-09-04 DIAGNOSIS — F3181 Bipolar II disorder: Secondary | ICD-10-CM | POA: Diagnosis not present

## 2018-09-04 NOTE — Telephone Encounter (Signed)
Left mess for patient to call back. CRM created.  

## 2018-09-04 NOTE — Telephone Encounter (Signed)
On her November lab work, it looks like her Vitamin D level was back to normal We will stop the vit D 50000 IU weekly and have her start an OTC vitamin D 2000 IU supplement once daily please

## 2018-09-08 ENCOUNTER — Ambulatory Visit: Payer: 59 | Attending: Family Medicine | Admitting: Physical Therapy

## 2018-09-08 ENCOUNTER — Encounter: Payer: Self-pay | Admitting: Physical Therapy

## 2018-09-08 DIAGNOSIS — M25552 Pain in left hip: Secondary | ICD-10-CM | POA: Diagnosis not present

## 2018-09-08 DIAGNOSIS — M25652 Stiffness of left hip, not elsewhere classified: Secondary | ICD-10-CM | POA: Insufficient documentation

## 2018-09-08 DIAGNOSIS — R262 Difficulty in walking, not elsewhere classified: Secondary | ICD-10-CM | POA: Diagnosis not present

## 2018-09-08 DIAGNOSIS — M25551 Pain in right hip: Secondary | ICD-10-CM | POA: Diagnosis not present

## 2018-09-08 DIAGNOSIS — M6281 Muscle weakness (generalized): Secondary | ICD-10-CM | POA: Insufficient documentation

## 2018-09-08 NOTE — Patient Instructions (Signed)

## 2018-09-08 NOTE — Therapy (Signed)
[place], [place], [address] Phone: [phone]   Fax:  [phone]  Physical Therapy Treatment  Patient Details  Name: Tracy [name] MRN: [id number] Date of Birth: [birth year] Referring Provider (PT): Hulan Saas, DO   Encounter Date: 09/08/2018  PT End of Session - 09/08/18 2013    Visit Number  2    Number of Visits  12    Date for PT Re-Evaluation  10/13/18    Authorization Type  [facility]    PT Start Time  1630    PT Stop Time  3570    PT Time Calculation (min)  47 min    Activity Tolerance  Patient limited by pain    Behavior During Therapy  [facility] [place] for tasks assessed/performed       Past Medical History:  Diagnosis Date  . Anxiety   . Arthritis    back, knees, right elbow  . Bipolar disorder ([place])   . Cirrhosis ([place])   . Dental crowns present   . Depression   . Diabetes mellitus without complication ([place])   . GERD (gastroesophageal reflux disease)   . High cholesterol    no current med.  Marland Kitchen History of kidney stones   . History of migraine headaches   . Hypertension    under control with meds., has been on med. x 4 yr.  . Hypothyroidism   . NASH (nonalcoholic steatohepatitis)   . PCOS (polycystic ovarian syndrome)   . PONV (postoperative nausea and vomiting)    also hx. of emergence delirium 2007  . Sleep apnea    uses CPAP nightly  . Trigeminal neuralgia     Past Surgical History:  Procedure Laterality Date  . ACHILLES TENDON SURGERY Left 2007  . APPENDECTOMY    . CARPAL TUNNEL RELEASE Right 01/12/2016   Procedure: RIGHT CARPAL TUNNEL RELEASE;  Surgeon: Roseanne Kaufman, MD;  Location: [place];  Service: Orthopedics;  Laterality: Right;  . COLONOSCOPY WITH PROPOFOL N/A 04/20/2014   Procedure: COLONOSCOPY WITH PROPOFOL;  Surgeon: Arta Silence, MD;  [facility];  Service: Endoscopy;  Laterality: N/A;  . ESOPHAGOGASTRODUODENOSCOPY (EGD) WITH  PROPOFOL N/A 04/20/2014   Procedure: ESOPHAGOGASTRODUODENOSCOPY (EGD) WITH PROPOFOL;  Surgeon: Arta Silence, MD;  [facility];  Service: Endoscopy;  Laterality: N/A;  . EXCISION HAGLUND'S DEFORMITY WITH ACHILLES TENDON REPAIR Right 02/25/2013   Procedure: RIGHT ACHILLES DEBRIDEMENT AND RECONSTRUCTION;  HAGLUND'S EXCISION; GASTROC RECESSION AND FLEXOR HALLUCIS LONGUS TRANSFER;  Surgeon: Wylene Simmer, MD;  Location: [place];  Service: Orthopedics;  Laterality: Right;  . LUMBAR LAMINECTOMY     X 3  . neck fusion    . OVARIAN CYST SURGERY Left 1998  . PILONIDAL CYST EXCISION  1994  . RIGHT OOPHORECTOMY    . STERIOD INJECTION Left 01/12/2016   Procedure: STEROID INJECTION LEFT WRIST;  Surgeon: Roseanne Kaufman, MD;  Location: [place];  Service: Orthopedics;  Laterality: Left;  . TOTAL THYROIDECTOMY  2003  . UPPER GI ENDOSCOPY  01/19/2015    There were no vitals filed for this visit.  Subjective Assessment - 09/08/18 2006    Subjective  Left hip pain 6/10 pre-tx. Pt. noting pain has been exacerbated with exercises-no specific exacerbating movement just having increased soreness. Per MD injection may be considered.    Pertinent History  multi-year history symptoms, no significant improvement after injections, obesity, medical comorbidities    Currently in Pain?  Yes  Pain Score  6     Pain Location  Hip    Pain Orientation  Left    Pain Descriptors / Indicators  Dull    Pain Type  Chronic pain    Pain Onset  More than a month ago    Pain Frequency  Constant    Aggravating Factors   standing and walking, activity    Pain Relieving Factors  rest and medication    Effect of Pain on Daily Activities  limits tolerance for standing and walking, activity tolerance for ADLs                       [facility] Adult PT Treatment/Exercise - 09/08/18 0001      Knee/Hip Exercises: Stretches   Hip Flexor Stretch  Left;3 reps;30 seconds    Hip  Flexor Stretch Limitations  supine with left LE off edge of mat in modified Thomas test position    Piriformis Stretch  Left;3 reps;30 seconds      Knee/Hip Exercises: Aerobic   Nustep  L1 x 5 min UE/LE      Knee/Hip Exercises: Standing   Hip Flexion  Left;1 set;10 reps    Hip Flexion Limitations  short arc/"march"    Hip Abduction  AROM;Left;10 reps      Knee/Hip Exercises: Supine   Bridges  Both;15 reps    Bridges Limitations  legs on bolster    Other Supine Knee/Hip Exercises  clamshell red band 2x10      Manual Therapy   Joint Mobilization  Left hip mobilization long axis distraction grade I-III, ER and caudal glide mobilizations grade I-III    Soft tissue mobilization  STM left lateral hip, piriformis, proximal IT band       Trigger Point Dry Needling - 09/08/18 2011    Consent Given?  Yes    Education Handout Provided  Yes    Muscles Treated Lower Body  Gluteus minimus;Piriformis   incl. gluteus medius   Gluteus Minimus Response  Twitch response elicited    Piriformis Response  Twitch response elicited           PT Education - 09/08/18 2012    Education Details  dry needling, symptoms etiology with OA, posterolateral hip pain associated with bursitis vs. muscular contribution or atypical OA referral pattern    Person(s) Educated  Patient    Methods  Explanation;Demonstration    Comprehension  Verbalized understanding;Returned demonstration       PT Short Term Goals - 09/01/18 2127      PT SHORT TERM GOAL #1   Title  Independent with HEP    Baseline  no HEP    Time  3    Period  Weeks    Status  New    Target Date  09/22/18      PT SHORT TERM GOAL #2   Title  Increase left hip flexion AROM 10 deg or greater to improve ability to donn shoes and perform transfers from low seating    Baseline  90 deg    Time  3    Period  Weeks    Status  New    Target Date  09/22/18        PT Long Term Goals - 09/01/18 2129      PT LONG TERM GOAL #1   Title   Improve FOTO score to 54% or less impairment    Baseline  73% limited    Time  6    Period  Weeks    Status  New    Target Date  10/13/18      PT LONG TERM GOAL #2   Title  Increase left hip and knee strength grossly 1/2 MMT or greater to improve ability for stair navigation, transfers from low chairs    Baseline  see flowsheet-4-/5 to 4/5    Time  6    Period  Weeks    Status  New    Target Date  10/13/18      PT LONG TERM GOAL #3   Title  Tolerate standing for 10-15 min for activities such as doing dishes, showering, IADLs    Baseline  difficulty/limited due to pain, limited <5 min    Time  6    Period  Weeks    Status  New    Target Date  10/13/18      PT LONG TERM GOAL #4   Title  Tolerate ambulation and periods 20-30 min for grocery shopping, IADLs    Baseline  difficulty walking >5 min due to pain    Time  6    Period  Weeks    Status  New    Target Date  10/13/18            Plan - 09/08/18 2014    Clinical Impression Statement  Left hip AROM and activity tolerance still limited by pain and soreness but generally able to perform/modify exercises and manual portion of treatment for improved tolerance. Focus exercises and manual therapy to address hip pain and tightness but included trial dry needling to address posterolateral hip muscular discomfort with good tolerance. Will await further tx. response and continue to progress as tolerated to try and improve functional status for mobility.    Clinical Presentation  Stable    Clinical Decision Making  Low    Rehab Potential  Fair    Clinical Impairments Affecting Rehab Potential  underlying OA, duration of symptoms, level of weakness and ROM limitation    PT Frequency  2x / week    PT Duration  6 weeks    PT Treatment/Interventions  ADLs/Self Care Home Management;Cryotherapy;Ultrasound;Moist Heat;Iontophoresis 4mg /ml Dexamethasone;Electrical Stimulation;Therapeutic activities;Therapeutic exercise;Balance training;Gait  training;Neuromuscular re-education;Functional mobility training;Manual techniques;Dry needling;Taping;Patient/family education;Passive range of motion    PT Next Visit Plan  Check response dry needling and further include as found beneficial, continue exercises (open chain focus), stretches and STM, joint mobs + modalities as needed    PT Home Exercise Plan  hip bridge, hip add. isometric, clamshell, LAQ, hamstring and piriformis stretches    Consulted and Agree with Plan of Care  Patient       Patient will benefit from skilled therapeutic intervention in order to improve the following deficits and impairments:  Abnormal gait, Pain, Decreased activity tolerance, Decreased range of motion, Decreased strength, Hypomobility, Decreased endurance, Obesity, Impaired flexibility, Difficulty walking  Visit Diagnosis: Pain in left hip  Stiffness of left hip, not elsewhere classified  Pain in right hip  Muscle weakness (generalized)  Difficulty in walking, not elsewhere classified     Problem List Patient Active Problem List   Diagnosis Date Noted  . Greater trochanteric bursitis of left hip 07/24/2018  . Arthritis of left hip 06/25/2018  . Encounter for general adult medical examination with abnormal findings 01/04/2018  . Vitamin D deficiency 01/04/2018  . Lower back pain 07/18/2017  . Hemorrhoids, internal 07/18/2017  . Trigeminal neuralgia 01/18/2016  . Bipolar disorder ([place])  11/28/2015  . Borderline personality disorder ([place]) 11/28/2015  . Carpal tunnel syndrome 09/06/2015  . MDD (major depressive disorder) 06/06/2015  . Severe episode of recurrent major depressive disorder, without psychotic features ([place])   . History of posttraumatic stress disorder (PTSD)   . GAD (generalized anxiety disorder)   . Insulin resistance 02/09/2015  . 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 Acanthosis nigricans, acquired 02/09/2015  . Nonalcoholic fatty liver disease 02/09/2015  . Hepatic cirrhosis ([place])  02/09/2015  . Female hirsutism 02/09/2015  . Oligomenorrhea 02/09/2015  . Infertility associated with anovulation 02/09/2015  . Morbid obesity ([place]) 10/31/2014  . Anxiety and depression 10/31/2014  . Obstructive sleep apnea 06/27/2014  . Diabetes mellitus, type 2 ([place]) 06/10/2013  . 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[place]) 01/01/2012  . Cystic teratoma   . PCOS (polycystic ovarian syndrome)   . Infertility, female   . TOBACCO USE, QUIT 02/08/2010  . HYPOTHYROIDISM, POSTSURGICAL 10/02/2007  . POLYCYSTIC OVARIAN DISEASE 10/02/2007  . Elevated cholesterol 10/02/2007  . DEPRESSION 10/02/2007  . Migraine 10/02/2007  . Essential hypertension 10/02/2007  . RENAL CALCULUS, HX OF 10/02/2007    Beaulah Dinning, PT, DPT 09/08/18 8:19 PM  [facility] [address] [place], [place], [address] Phone: [phone]   Fax:  [phone]  Name: Tracy [name] MRN: [id number] Date of Birth: 01/13/1966

## 2018-09-10 ENCOUNTER — Encounter: Payer: Self-pay | Admitting: Nurse Practitioner

## 2018-09-15 ENCOUNTER — Ambulatory Visit: Payer: Self-pay | Admitting: Family Medicine

## 2018-09-15 ENCOUNTER — Ambulatory Visit: Payer: 59 | Admitting: Physical Therapy

## 2018-09-15 NOTE — Progress Notes (Deleted)
[name] [place], [place] [address] Phone: [phone] Subjective:     CC: Hip pain.  [id number]  Tracy [name] is a 53 y.o. female coming in with complaint of hip pain follow-up.  Patient was seen previously and does have arthritic changes of the knee joint.  Currently with physical therapy with the left hip.  Had more pain.  Started meloxicam.     Past Medical History:  Diagnosis Date  . Anxiety   . Arthritis    back, knees, right elbow  . Bipolar disorder ([place])   . Cirrhosis ([place])   . Dental crowns present   . Depression   . Diabetes mellitus without complication ([place])   . GERD (gastroesophageal reflux disease)   . High cholesterol    no current med.  Marland Kitchen History of kidney stones   . History of migraine headaches   . Hypertension    under control with meds., has been on med. x 4 yr.  . Hypothyroidism   . NASH (nonalcoholic steatohepatitis)   . PCOS (polycystic ovarian syndrome)   . PONV (postoperative nausea and vomiting)    also hx. of emergence delirium 2007  . Sleep apnea    uses CPAP nightly  . Trigeminal neuralgia    Past Surgical History:  Procedure Laterality Date  . ACHILLES TENDON SURGERY Left 2007  . APPENDECTOMY    . CARPAL TUNNEL RELEASE Right 01/12/2016   Procedure: RIGHT CARPAL TUNNEL RELEASE;  Surgeon: Roseanne Kaufman, MD;  Location: [place];  Service: Orthopedics;  Laterality: Right;  . COLONOSCOPY WITH PROPOFOL N/A 04/20/2014   Procedure: COLONOSCOPY WITH PROPOFOL;  Surgeon: Arta Silence, MD;  [facility];  Service: Endoscopy;  Laterality: N/A;  . ESOPHAGOGASTRODUODENOSCOPY (EGD) WITH PROPOFOL N/A 04/20/2014   Procedure: ESOPHAGOGASTRODUODENOSCOPY (EGD) WITH PROPOFOL;  Surgeon: Arta Silence, MD;  [facility];  Service: Endoscopy;  Laterality: N/A;  . EXCISION HAGLUND'S DEFORMITY WITH ACHILLES TENDON REPAIR Right 02/25/2013   Procedure: RIGHT ACHILLES  DEBRIDEMENT AND RECONSTRUCTION;  HAGLUND'S EXCISION; GASTROC RECESSION AND FLEXOR HALLUCIS LONGUS TRANSFER;  Surgeon: Wylene Simmer, MD;  Location: [place];  Service: Orthopedics;  Laterality: Right;  . LUMBAR LAMINECTOMY     X 3  . neck fusion    . OVARIAN CYST SURGERY Left 1998  . PILONIDAL CYST EXCISION  1994  . RIGHT OOPHORECTOMY    . STERIOD INJECTION Left 01/12/2016   Procedure: STEROID INJECTION LEFT WRIST;  Surgeon: Roseanne Kaufman, MD;  Location: [place];  Service: Orthopedics;  Laterality: Left;  . TOTAL THYROIDECTOMY  2003  . UPPER GI ENDOSCOPY  01/19/2015   Social History   Socioeconomic History  . Marital status: Married    Spouse name: Not on file  . Number of children: 0  . Years of education: 72  . Highest education level: Not on file  Occupational History  . 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 Financial resource strain: Not on file  . Food insecurity:    Worry: Not on file    Inability: Not on file  . Transportation needs:    Medical: Not on file    Non-medical: Not on file  Tobacco Use  . Smoking status: Current Every Day Smoker    Packs/day: 0.50    Years: 15.00    Pack years: 7.50    Types: Cigarettes  . Smokeless tobacco: Never Used  Substance and Sexual Activity  . Alcohol  use: No    Alcohol/week: 0.0 standard drinks    Comment: Has not had any alcohol since 07/2014 - before this date, she rarely drink.  . Drug use: No  . Sexual activity: Never    Partners: Male  Lifestyle  . Physical activity:    Days per week: Not on file    Minutes per session: Not on file  . Stress: Not on file  Relationships  . Social connections:    Talks on phone: Not on file    Gets together: Not on file    Attends religious service: Not on file    Active member of club or organization: Not on file    Attends meetings of clubs or organizations: Not on file    Relationship status: Not on file  Other Topics Concern  . Not on file    Social History Narrative   RN - [employer]   Regular exercise: no   Caffeine use: 2 x daily   Right-handed.   Lives alone.   Allergies  Allergen Reactions  . Sulfa Antibiotics Itching, Swelling and Rash  . Bupropion Hcl Itching and Rash  . Codeine Itching  . Sulfamethoxazole-Trimethoprim Rash   Family History  Problem Relation Age of Onset  . Heart disease Father   . Depression Father   . Cancer Brother        THROAT  . Depression Brother   . Anxiety disorder Brother   . Depression Sister   . Breast cancer Mother   . Hypertension Mother     Current Outpatient Medications (Endocrine & Metabolic):  .  levothyroxine (SYNTHROID, LEVOTHROID) 300 MCG tablet, Take 300 mcg by mouth See admin instructions. Takes 394m on Odd Days of the month .  SYNTHROID 200 MCG tablet, Take 300 mcg 3 days a week and 200 mcg 4 days a week.  Current Outpatient Medications (Cardiovascular):  .  lisinopril-hydrochlorothiazide (PRINZIDE,ZESTORETIC) 10-12.5 MG tablet, Take 1 tablet by mouth every morning.   Current Outpatient Medications (Analgesics):  .  ibuprofen (ADVIL,MOTRIN) 600 MG tablet, Take 600 mg by mouth 3 (three) times daily.  .  meloxicam (MOBIC) 15 MG tablet, Take 1 tablet (15 mg total) by mouth daily. .  rizatriptan (MAXALT) 10 MG tablet, Take 1 tablet (10 mg total) by mouth as needed for migraine. May repeat in 2 hours if needed   Current Outpatient Medications (Other):  .  Blood Glucose Monitoring Suppl (FREESTYLE FREEDOM LITE) w/Device KIT, USE TO CHECK GLUCOSE .  clonazePAM (KLONOPIN) 1 MG tablet, Take 1 mg by mouth 3 (three) times daily. Take 0.561mtwice daily and then take 1 mg at bedtime .  Diclofenac Sodium 2 % SOLN, Place 2 g onto the skin 2 (two) times daily. . Marland KitchenFLUoxetine (PROZAC) 20 MG tablet, Take 20 mg by mouth every morning. .  gabapentin (NEURONTIN) 100 MG capsule, Take 2 capsules (200 mg total) by mouth at bedtime. (Patient not taking: Reported on 09/01/2018) .   lamoTRIgine (LAMICTAL) 200 MG tablet, Take 400 mg by mouth daily.  . Marland Kitchenomeprazole (PRILOSEC) 20 MG capsule, Take 1 capsule (20 mg total) by mouth daily. .  ondansetron (ZOFRAN-ODT) 4 MG disintegrating tablet, Take 4 mg by mouth every 4 (four) hours as needed for nausea or vomiting.  .  ranitidine (ZANTAC) 150 MG tablet, Take 1 tablet by mouth 2 times daily    Past medical history, social, surgical and family history all reviewed in electronic medical record.  No pertanent information unless  stated regarding to the chief complaint.   Review of Systems:  No headache, visual changes, nausea, vomiting, diarrhea, constipation, dizziness, abdominal pain, skin rash, fevers, chills, night sweats, weight loss, swollen lymph nodes, body aches, joint swelling, muscle aches, chest pain, shortness of breath, mood changes.   Objective  There were no vitals taken for this visit. Systems examined below as of    General: No apparent distress alert and oriented x3 mood and affect normal, dressed appropriately.  HEENT: Pupils equal, extraocular movements intact  Respiratory: Patient's speak in full sentences and does not appear short of breath  Cardiovascular: No lower extremity edema, non tender, no erythema  Skin: Warm dry intact with no signs of infection or rash on extremities or on axial skeleton.  Abdomen: Soft nontender  Neuro: Cranial nerves II through XII are intact, neurovascularly intact in all extremities with 2+ DTRs and 2+ pulses.  Lymph: No lymphadenopathy of posterior or anterior cervical chain or axillae bilaterally.  Gait normal with good balance and coordination.  MSK:  Non tender with full range of motion and good stability and symmetric strength and tone of shoulders, elbows, wrist, hip, knee and ankles bilaterally.     Impression and Recommendations:     This case required medical decision making of moderate complexity. The above documentation has been reviewed and is accurate and  complete Lyndal Pulley, DO       Note: This dictation was prepared with Dragon dictation along with smaller phrase technology. Any transcriptional errors that result from this process are unintentional.

## 2018-09-17 ENCOUNTER — Ambulatory Visit: Payer: 59 | Admitting: Physical Therapy

## 2018-09-17 ENCOUNTER — Encounter: Payer: Self-pay | Admitting: Physical Therapy

## 2018-09-17 DIAGNOSIS — M25551 Pain in right hip: Secondary | ICD-10-CM | POA: Diagnosis not present

## 2018-09-17 DIAGNOSIS — M6281 Muscle weakness (generalized): Secondary | ICD-10-CM

## 2018-09-17 DIAGNOSIS — R262 Difficulty in walking, not elsewhere classified: Secondary | ICD-10-CM

## 2018-09-17 DIAGNOSIS — M25552 Pain in left hip: Secondary | ICD-10-CM

## 2018-09-17 DIAGNOSIS — M25652 Stiffness of left hip, not elsewhere classified: Secondary | ICD-10-CM

## 2018-09-17 NOTE — Therapy (Signed)
[place], [place], [address] Phone: [phone]   Fax:  [phone]  Physical Therapy Treatment  Patient Details  Name: Tracy [name] MRN: [id number] Date of Birth: [birth year] Referring Provider (PT): Hulan Saas, DO   Encounter Date: 09/17/2018  PT End of Session - 09/17/18 2102    Visit Number  3    Number of Visits  12    Date for PT Re-Evaluation  10/13/18    Authorization Type  [employer]    PT Start Time  1245    PT Stop Time  8099   direct timed minutes limited to 34 (dry needling not included in direct timed minutes)   PT Time Calculation (min)  44 min    Activity Tolerance  Patient limited by pain    Behavior During Therapy  [facility] for tasks assessed/performed       Past Medical History:  Diagnosis Date  . Anxiety   . Arthritis    back, knees, right elbow  . Bipolar disorder ([place])   . Cirrhosis ([place])   . Dental crowns present   . Depression   . Diabetes mellitus without complication ([place])   . GERD (gastroesophageal reflux disease)   . High cholesterol    no current med.  Marland Kitchen History of kidney stones   . History of migraine headaches   . Hypertension    under control with meds., has been on med. x 4 yr.  . Hypothyroidism   . NASH (nonalcoholic steatohepatitis)   . PCOS (polycystic ovarian syndrome)   . PONV (postoperative nausea and vomiting)    also hx. of emergence delirium 2007  . Sleep apnea    uses CPAP nightly  . Trigeminal neuralgia     Past Surgical History:  Procedure Laterality Date  . ACHILLES TENDON SURGERY Left 2007  . APPENDECTOMY    . CARPAL TUNNEL RELEASE Right 01/12/2016   Procedure: RIGHT CARPAL TUNNEL RELEASE;  Surgeon: Roseanne Kaufman, MD;  Location: [place];  Service: Orthopedics;  Laterality: Right;  . COLONOSCOPY WITH PROPOFOL N/A 04/20/2014   Procedure: COLONOSCOPY WITH PROPOFOL;  Surgeon: Arta Silence, MD;  [facility];  Service: Endoscopy;  Laterality: N/A;  . ESOPHAGOGASTRODUODENOSCOPY (EGD) WITH PROPOFOL N/A 04/20/2014   Procedure: ESOPHAGOGASTRODUODENOSCOPY (EGD) WITH PROPOFOL;  Surgeon: Arta Silence, MD;  [facility];  Service: Endoscopy;  Laterality: N/A;  . EXCISION HAGLUND'S DEFORMITY WITH ACHILLES TENDON REPAIR Right 02/25/2013   Procedure: RIGHT ACHILLES DEBRIDEMENT AND RECONSTRUCTION;  HAGLUND'S EXCISION; GASTROC RECESSION AND FLEXOR HALLUCIS LONGUS TRANSFER;  Surgeon: Wylene Simmer, MD;  Location: [facility];  Service: Orthopedics;  Laterality: Right;  . LUMBAR LAMINECTOMY     X 3  . neck fusion    . OVARIAN CYST SURGERY Left 1998  . PILONIDAL CYST EXCISION  1994  . RIGHT OOPHORECTOMY    . STERIOD INJECTION Left 01/12/2016   Procedure: STEROID INJECTION LEFT WRIST;  Surgeon: Roseanne Kaufman, MD;  Location: [place];  Service: Orthopedics;  Laterality: Left;  . TOTAL THYROIDECTOMY  2003  . UPPER GI ENDOSCOPY  01/19/2015    There were no vitals filed for this visit.  Subjective Assessment - 09/17/18 2054    Subjective  Mild relief after dry needling to posterolateral hip region last session. 3/10 pain today left posterolateral hip region. Very painful with recent standing/ambulation for activities such as shopping.    Currently in Pain?  Yes  Pain Score  3     Pain Location  Hip    Pain Orientation  Left;Posterior;Lateral    Pain Descriptors / Indicators  Aching;Dull    Pain Type  Chronic pain    Pain Onset  More than a month ago    Pain Frequency  Constant    Aggravating Factors   standing and walking    Pain Relieving Factors  rest, medication    Effect of Pain on Daily Activities  limits tolerance for standing and walking for IADLs and community mobility         [facility] PT Assessment - 09/17/18 0001      AROM   Left Hip Flexion  95                   [facility] Adult PT Treatment/Exercise - 09/17/18 0001      Knee/Hip  Exercises: Stretches   Piriformis Stretch  Left;3 reps;30 seconds    Other Knee/Hip Stretches  attempted 3 variations of IT band + lateral hip stretches but held due to c/o anterior hip and groin pain      Knee/Hip Exercises: Supine   Other Supine Knee/Hip Exercises  Hip "fall outs" x 15, hip abd 2x10 left side      Manual Therapy   Joint Mobilization  Left hip long axis distaction grade I-IV    Soft tissue mobilization  STM left piriformis, gluteus medius/minimus, distal lateral glut max       Trigger Point Dry Needling - 09/17/18 2100    Consent Given?  Yes    Muscles Treated Lower Body  Gluteus minimus;Gluteus maximus;Piriformis    Gluteus Maximus Response  Twitch response elicited    Gluteus Minimus Response  Twitch response elicited    Piriformis Response  Twitch response elicited           PT Education - 09/17/18 2101    Education Details  muscular vs. OA pain, potential for posterolateral hip pain as less common referral pattern for OA pain vs. could be muscular    Person(s) Educated  Patient    Methods  Explanation    Comprehension  Verbalized understanding       PT Short Term Goals - 09/17/18 2108      PT SHORT TERM GOAL #1   Title  Independent with HEP    Baseline  met with initial HEP, will update prn    Time  3    Period  Weeks    Status  Achieved      PT SHORT TERM GOAL #2   Title  Increase left hip flexion AROM 10 deg or greater to improve ability to donn shoes and perform transfers from low seating    Baseline  95 deg (5 deg improvement)    Time  3    Period  Weeks    Status  On-going        PT Long Term Goals - 09/17/18 2107      PT LONG TERM GOAL #1   Title  Improve FOTO score to 54% or less impairment    Baseline  73% limited at eval, not retested today    Time  6    Period  Weeks    Status  On-going      PT LONG TERM GOAL #2   Title  Increase left hip and knee strength grossly 1/2 MMT or greater to improve ability for stair navigation,  transfers from low chairs  Baseline  4-/5 to 4/5 at eval, not retested today    Time  6    Period  Weeks    Status  On-going      PT LONG TERM GOAL #3   Title  Tolerate standing for 10-15 min for activities such as doing dishes, showering, IADLs    Baseline  difficulty/limited due to pain, limited <5 min    Time  6    Period  Weeks    Status  On-going      PT LONG TERM GOAL #4   Title  Tolerate ambulation and periods 20-30 min for grocery shopping, IADLs    Baseline  difficulty walking >5 min due to pain    Time  6    Period  Weeks    Status  On-going            Plan - 09/17/18 2104    Clinical Impression Statement  More focus manual therapy today as well as more extensive inclusion dry needling to try and address muscular component of posterolateral hip pain. Mild improvement in hip flexion ROM from baseline and mild benefit from last tx. session but overall contines with high pain level and limited exercise + activity tolerance.    Rehab Potential  Fair    Clinical Impairments Affecting Rehab Potential  underlying OA, duration of symptoms, level of weakness and ROM limitation    PT Frequency  2x / week    PT Duration  6 weeks    PT Treatment/Interventions  ADLs/Self Care Home Management;Cryotherapy;Ultrasound;Moist Heat;Iontophoresis 77m/ml Dexamethasone;Electrical Stimulation;Therapeutic activities;Therapeutic exercise;Balance training;Gait training;Neuromuscular re-education;Functional mobility training;Manual techniques;Dry needling;Taping;Patient/family education;Passive range of motion    PT Next Visit Plan  Further dry needling and STM as tolerated, potential use estim next session, continue hip exercises/stretches, strengthening as tolerated pending pain    PT Home Exercise Plan  hip bridge, hip add. isometric, clamshell, LAQ, hamstring and piriformis stretches    Consulted and Agree with Plan of Care  Patient       Patient will benefit from skilled therapeutic  intervention in order to improve the following deficits and impairments:  Abnormal gait, Pain, Decreased activity tolerance, Decreased range of motion, Decreased strength, Hypomobility, Decreased endurance, Obesity, Impaired flexibility, Difficulty walking  Visit Diagnosis: Pain in left hip  Stiffness of left hip, not elsewhere classified  Pain in right hip  Muscle weakness (generalized)  Difficulty in walking, not elsewhere classified     Problem List Patient Active Problem List   Diagnosis Date Noted  . Greater trochanteric bursitis of left hip 07/24/2018  . Arthritis of left hip 06/25/2018  . Encounter for general adult medical examination with abnormal findings 01/04/2018  . Vitamin D deficiency 01/04/2018  . Lower back pain 07/18/2017  . Hemorrhoids, internal 07/18/2017  . Trigeminal neuralgia 01/18/2016  . Bipolar disorder ([place] 11/28/2015  . Borderline personality disorder ([place] 11/28/2015  . Carpal tunnel syndrome 09/06/2015  . MDD (major depressive disorder) 06/06/2015  . Severe episode of recurrent major depressive disorder, without psychotic features ([place]   . History of posttraumatic stress disorder (PTSD)   . GAD (generalized anxiety disorder)   . Insulin resistance 02/09/2015  . 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 Acanthosis nigricans, acquired 02/09/2015  . Nonalcoholic fatty liver disease 02/09/2015  . Hepatic cirrhosis ([place] 02/09/2015  . Female hirsutism 02/09/2015  . Oligomenorrhea 02/09/2015  . Infertility associated with anovulation 02/09/2015  . Morbid obesity ([place] 10/31/2014  . Anxiety and depression 10/31/2014  . Obstructive sleep apnea 06/27/2014  .  Diabetes mellitus, type 2 ([place]) 06/10/2013  . 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[place]) 01/01/2012  . Cystic teratoma   . PCOS (polycystic ovarian syndrome)   . Infertility, female   . TOBACCO USE, QUIT 02/08/2010  . HYPOTHYROIDISM, POSTSURGICAL 10/02/2007  . POLYCYSTIC OVARIAN DISEASE 10/02/2007  . Elevated  cholesterol 10/02/2007  . DEPRESSION 10/02/2007  . Migraine 10/02/2007  . Essential hypertension 10/02/2007  . RENAL CALCULUS, HX OF 10/02/2007   Beaulah Dinning, PT, DPT 09/17/18 9:10 PM  [place] [facility] [address] [place], [place], [address] Phone: [phone]   Fax:  [phone]  Name: Tracy [name] MRN: [id number] Date of Birth: 1965/08/18

## 2018-09-18 ENCOUNTER — Ambulatory Visit (INDEPENDENT_AMBULATORY_CARE_PROVIDER_SITE_OTHER): Payer: 59 | Admitting: "Endocrinology

## 2018-09-18 DIAGNOSIS — F3181 Bipolar II disorder: Secondary | ICD-10-CM | POA: Diagnosis not present

## 2018-09-22 ENCOUNTER — Encounter: Payer: Self-pay | Admitting: Physical Therapy

## 2018-09-22 ENCOUNTER — Ambulatory Visit: Payer: 59 | Admitting: Physical Therapy

## 2018-09-22 DIAGNOSIS — M25652 Stiffness of left hip, not elsewhere classified: Secondary | ICD-10-CM

## 2018-09-22 DIAGNOSIS — M25552 Pain in left hip: Secondary | ICD-10-CM | POA: Diagnosis not present

## 2018-09-22 DIAGNOSIS — M6281 Muscle weakness (generalized): Secondary | ICD-10-CM

## 2018-09-22 DIAGNOSIS — M25551 Pain in right hip: Secondary | ICD-10-CM

## 2018-09-22 DIAGNOSIS — R262 Difficulty in walking, not elsewhere classified: Secondary | ICD-10-CM | POA: Diagnosis not present

## 2018-09-22 NOTE — Therapy (Signed)
[place], [place], [address] Phone: [phone]   Fax:  [phone]  Physical Therapy Treatment  Patient Details  Name: Nishika [place] MRN: [id number] Date of Birth: [birth year] Referring Provider (PT): Hulan Saas, DO   Encounter Date: 09/22/2018  PT End of Session - 09/22/18 1728    Visit Number  4    Number of Visits  12    Date for PT Re-Evaluation  10/13/18    Authorization Type  [facility] [employer]    PT Start Time  1625    PT Stop Time  1720    PT Time Calculation (min)  55 min    Activity Tolerance  Patient tolerated treatment well    Behavior During Therapy  [facility] for tasks assessed/performed       Past Medical History:  Diagnosis Date  . Anxiety   . Arthritis    back, knees, right elbow  . Bipolar disorder ([place])   . Cirrhosis ([place])   . Dental crowns present   . Depression   . Diabetes mellitus without complication ([place])   . GERD (gastroesophageal reflux disease)   . High cholesterol    no current med.  Marland Kitchen History of kidney stones   . History of migraine headaches   . Hypertension    under control with meds., has been on med. x 4 yr.  . Hypothyroidism   . NASH (nonalcoholic steatohepatitis)   . PCOS (polycystic ovarian syndrome)   . PONV (postoperative nausea and vomiting)    also hx. of emergence delirium 2007  . Sleep apnea    uses CPAP nightly  . Trigeminal neuralgia     Past Surgical History:  Procedure Laterality Date  . ACHILLES TENDON SURGERY Left 2007  . APPENDECTOMY    . CARPAL TUNNEL RELEASE Right 01/12/2016   Procedure: RIGHT CARPAL TUNNEL RELEASE;  Surgeon: Roseanne Kaufman, MD;  Location: [facility];  Service: Orthopedics;  Laterality: Right;  . COLONOSCOPY WITH PROPOFOL N/A 04/20/2014   Procedure: COLONOSCOPY WITH PROPOFOL;  Surgeon: Arta Silence, MD;  [facility];  Service: Endoscopy;  Laterality: N/A;  . ESOPHAGOGASTRODUODENOSCOPY (EGD)  WITH PROPOFOL N/A 04/20/2014   Procedure: ESOPHAGOGASTRODUODENOSCOPY (EGD) WITH PROPOFOL;  Surgeon: Arta Silence, MD;  [facility];  Service: Endoscopy;  Laterality: N/A;  . EXCISION HAGLUND'S DEFORMITY WITH ACHILLES TENDON REPAIR Right 02/25/2013   Procedure: RIGHT ACHILLES DEBRIDEMENT AND RECONSTRUCTION;  HAGLUND'S EXCISION; GASTROC RECESSION AND FLEXOR HALLUCIS LONGUS TRANSFER;  Surgeon: Wylene Simmer, MD;  Location: [facility];  Service: Orthopedics;  Laterality: Right;  . LUMBAR LAMINECTOMY     X 3  . neck fusion    . OVARIAN CYST SURGERY Left 1998  . PILONIDAL CYST EXCISION  1994  . RIGHT OOPHORECTOMY    . STERIOD INJECTION Left 01/12/2016   Procedure: STEROID INJECTION LEFT WRIST;  Surgeon: Roseanne Kaufman, MD;  Location: [facility];  Service: Orthopedics;  Laterality: Left;  . TOTAL THYROIDECTOMY  2003  . UPPER GI ENDOSCOPY  01/19/2015    There were no vitals filed for this visit.  Subjective Assessment - 09/22/18 1628    Subjective  I really liked the ry needle and the massage.  (Pended)     Currently in Pain?  Yes  (Pended)     Pain Score  0-No pain  (Pended)     Yesterday to 4/10   Pain Location  Hip  (Pended)     Pain Orientation  Left;Anterior;Posterior  (Pended)                        [facility] Adult PT Treatment/Exercise - 09/22/18 0001      Self-Care   Self-Care  --   scoot hips to side prior to standing to decrease spasm.      Knee/Hip Exercises: Stretches   Other Knee/Hip Stretches  quadratus lumborum stretches standing at wall,  side bend stretching right only and on side arm overhead over rolled towel.      Manual Therapy   Manual therapy comments  tennis ball issued for soft tissue    Soft tissue mobilization  STM left piriformis, gluteus medius/minimus, distal lateral glut max  proximal rectus femoris, quadratus lumborum left  instrument assist at times  gentle strumming  less pain noted              PT Education - 09/22/18 1745    Education Details  HEP,  anatomy  how to use tennis ball for soft tissue    Person(s) Educated  Patient    Methods  Explanation;Demonstration;Tactile cues;Verbal cues;Handout    Comprehension  Returned demonstration;Verbalized understanding       PT Short Term Goals - 09/17/18 2108      PT SHORT TERM GOAL #1   Title  Independent with HEP    Baseline  met with initial HEP, will update prn    Time  3    Period  Weeks    Status  Achieved      PT SHORT TERM GOAL #2   Title  Increase left hip flexion AROM 10 deg or greater to improve ability to donn shoes and perform transfers from low seating    Baseline  95 deg (5 deg improvement)    Time  3    Period  Weeks    Status  On-going        PT Long Term Goals - 09/17/18 2107      PT LONG TERM GOAL #1   Title  Improve FOTO score to 54% or less impairment    Baseline  73% limited at eval, not retested today    Time  6    Period  Weeks    Status  On-going      PT LONG TERM GOAL #2   Title  Increase left hip and knee strength grossly 1/2 MMT or greater to improve ability for stair navigation, transfers from low chairs    Baseline  4-/5 to 4/5 at eval, not retested today    Time  6    Period  Weeks    Status  On-going      PT LONG TERM GOAL #3   Title  Tolerate standing for 10-15 min for activities such as doing dishes, showering, IADLs    Baseline  difficulty/limited due to pain, limited <5 min    Time  6    Period  Weeks    Status  On-going      PT LONG TERM GOAL #4   Title  Tolerate ambulation and periods 20-30 min for grocery shopping, IADLs    Baseline  difficulty walking >5 min due to pain    Time  6    Period  Weeks    Status  On-going            Plan - 09/22/18 1729    Clinical Impression Statement  Focus on manual and gentle stretching today  which  helped a lot last visit.    Quad tight left limited around 50 %. Quadratus lumborum  left tight and in spasm  initially.      PT Next Visit Plan  Further dry needling and STM as tolerated, potential use estim next session, continue hip exercises/stretches, strengthening as tolerated pending pain    PT Home Exercise Plan  hip bridge, hip add. isometric, clamshell, LAQ, hamstring and piriformis stretchesSupine quad stretch off edge of bed,  Quadratus lumborum stretch,  standing and on side.    Consulted and Agree with Plan of Care  Patient       Patient will benefit from skilled therapeutic intervention in order to improve the following deficits and impairments:     Visit Diagnosis: Pain in left hip  Stiffness of left hip, not elsewhere classified  Pain in right hip  Muscle weakness (generalized)  Difficulty in walking, not elsewhere classified     Problem List Patient Active Problem List   Diagnosis Date Noted  . Greater trochanteric bursitis of left hip 07/24/2018  . Arthritis of left hip 06/25/2018  . Encounter for general adult medical examination with abnormal findings 01/04/2018  . Vitamin D deficiency 01/04/2018  . Lower back pain 07/18/2017  . Hemorrhoids, internal 07/18/2017  . Trigeminal neuralgia 01/18/2016  . Bipolar disorder ([place]) 11/28/2015  . Borderline personality disorder ([place]) 11/28/2015  . Carpal tunnel syndrome 09/06/2015  . MDD (major depressive disorder) 06/06/2015  . Severe episode of recurrent major depressive disorder, without psychotic features ([place])   . History of posttraumatic stress disorder (PTSD)   . GAD (generalized anxiety disorder)   . Insulin resistance 02/09/2015  . 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 Acanthosis nigricans, acquired 02/09/2015  . Nonalcoholic fatty liver disease 02/09/2015  . Hepatic cirrhosis ([place]) 02/09/2015  . Female hirsutism 02/09/2015  . Oligomenorrhea 02/09/2015  . Infertility associated with anovulation 02/09/2015  . Morbid obesity ([place]) 10/31/2014  . Anxiety and depression 10/31/2014  . Obstructive sleep apnea 06/27/2014   . Diabetes mellitus, type 2 ([place]) 06/10/2013  . 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[place]) 01/01/2012  . Cystic teratoma   . PCOS (polycystic ovarian syndrome)   . Infertility, female   . TOBACCO USE, QUIT 02/08/2010  . HYPOTHYROIDISM, POSTSURGICAL 10/02/2007  . POLYCYSTIC OVARIAN DISEASE 10/02/2007  . Elevated cholesterol 10/02/2007  . DEPRESSION 10/02/2007  . Migraine 10/02/2007  . Essential hypertension 10/02/2007  . RENAL CALCULUS, HX OF 10/02/2007    Brewer Hitchman  PTA 09/22/2018, 5:50 PM  [facility] [address] [place], [place], [address] Phone: [phone]   Fax:  [phone]  Name: Wynonia Medero MRN: [id number] Date of Birth: 02-21-1966

## 2018-09-22 NOTE — Patient Instructions (Addendum)
Hip Flexor Stretch    Lying on back near edge of bed, bend one leg, foot flat. Hang other leg over edge, relaxed, thigh resting entirely on bed for __30__ seconds. Repeat _3_ times. Do _1___ sessions per day. Advanced Exercise: Bend knee back keeping thigh in contact with bed.  http://gt2.exer.us/347   Copyright  VHI. All rights reserved.  Also issued from exercise drawer:   Standing side bend stretching quadratus lumborum,  Standing at wall moving hip toward wall, and on side over rolled towel at waist.   PRN for pain 3 to 5 reps Hold 30 seconds 3 to 5 X each

## 2018-09-24 ENCOUNTER — Encounter: Payer: Self-pay | Admitting: Family Medicine

## 2018-09-24 ENCOUNTER — Ambulatory Visit: Payer: 59 | Admitting: Physical Therapy

## 2018-09-24 ENCOUNTER — Encounter: Payer: Self-pay | Admitting: Physical Therapy

## 2018-09-24 DIAGNOSIS — M25652 Stiffness of left hip, not elsewhere classified: Secondary | ICD-10-CM | POA: Diagnosis not present

## 2018-09-24 DIAGNOSIS — M25551 Pain in right hip: Secondary | ICD-10-CM | POA: Diagnosis not present

## 2018-09-24 DIAGNOSIS — M25552 Pain in left hip: Secondary | ICD-10-CM

## 2018-09-24 DIAGNOSIS — M6281 Muscle weakness (generalized): Secondary | ICD-10-CM

## 2018-09-24 DIAGNOSIS — R262 Difficulty in walking, not elsewhere classified: Secondary | ICD-10-CM

## 2018-09-24 MED ORDER — PREDNISONE 50 MG PO TABS: 50.0000 mg | ORAL_TABLET | Freq: Every day | ORAL | 0 refills | Status: DC

## 2018-09-24 NOTE — Therapy (Signed)
[place], [place], [address] Phone: [phone]   Fax:  [phone]  Physical Therapy Treatment  Patient Details  Name: Kamiya Acord MRN: [id number] Date of Birth: May 05, 1966 Referring Provider (PT): Hulan Saas, DO   Encounter Date: 09/24/2018  PT End of Session - 09/24/18 1725    Visit Number  5    Number of Visits  12    Date for PT Re-Evaluation  10/13/18    Authorization Type  [facility] [employer]    PT Start Time  1625    PT Stop Time  1727    PT Time Calculation (min)  62 min    Activity Tolerance  Patient tolerated treatment well;Patient limited by pain    Behavior During Therapy  [facility] for tasks assessed/performed       Past Medical History:  Diagnosis Date  . Anxiety   . Arthritis    back, knees, right elbow  . Bipolar disorder ([place])   . Cirrhosis ([place])   . Dental crowns present   . Depression   . Diabetes mellitus without complication ([place])   . GERD (gastroesophageal reflux disease)   . High cholesterol    no current med.  Marland Kitchen History of kidney stones   . History of migraine headaches   . Hypertension    under control with meds., has been on med. x 4 yr.  . Hypothyroidism   . NASH (nonalcoholic steatohepatitis)   . PCOS (polycystic ovarian syndrome)   . PONV (postoperative nausea and vomiting)    also hx. of emergence delirium 2007  . Sleep apnea    uses CPAP nightly  . Trigeminal neuralgia     Past Surgical History:  Procedure Laterality Date  . ACHILLES TENDON SURGERY Left 2007  . APPENDECTOMY    . CARPAL TUNNEL RELEASE Right 01/12/2016   Procedure: RIGHT CARPAL TUNNEL RELEASE;  Surgeon: Roseanne Kaufman, MD;  Location: [place];  Service: Orthopedics;  Laterality: Right;  . COLONOSCOPY WITH PROPOFOL N/A 04/20/2014   Procedure: COLONOSCOPY WITH PROPOFOL;  Surgeon: Arta Silence, MD;  [facility];  Service: Endoscopy;  Laterality: N/A;  .  ESOPHAGOGASTRODUODENOSCOPY (EGD) WITH PROPOFOL N/A 04/20/2014   Procedure: ESOPHAGOGASTRODUODENOSCOPY (EGD) WITH PROPOFOL;  Surgeon: Arta Silence, MD;  [facility];  Service: Endoscopy;  Laterality: N/A;  . EXCISION HAGLUND'S DEFORMITY WITH ACHILLES TENDON REPAIR Right 02/25/2013   Procedure: RIGHT ACHILLES DEBRIDEMENT AND RECONSTRUCTION;  HAGLUND'S EXCISION; GASTROC RECESSION AND FLEXOR HALLUCIS LONGUS TRANSFER;  Surgeon: Wylene Simmer, MD;  Location: [place];  Service: Orthopedics;  Laterality: Right;  . LUMBAR LAMINECTOMY     X 3  . neck fusion    . OVARIAN CYST SURGERY Left 1998  . PILONIDAL CYST EXCISION  1994  . RIGHT OOPHORECTOMY    . STERIOD INJECTION Left 01/12/2016   Procedure: STEROID INJECTION LEFT WRIST;  Surgeon: Roseanne Kaufman, MD;  Location: [place];  Service: Orthopedics;  Laterality: Left;  . TOTAL THYROIDECTOMY  2003  . UPPER GI ENDOSCOPY  01/19/2015    There were no vitals filed for this visit.  Subjective Assessment - 09/24/18 1628    Subjective  Called MD and he suggested Steriods  for pain.  Exercises are causing pain.  The tennis ball helped a little at work. This is a bad day.    Currently in Pain?  Yes    Pain Score  6     Pain Location  Hip  Pain Orientation  Left;Lateral;Anterior    Pain Descriptors / Indicators  Aching;Dull    Pain Type  Chronic pain    Pain Radiating Towards  lateral hip left    Pain Frequency  Constant    Aggravating Factors   standing walking,  Hip stretching exercises ( Hip flexer/  piriformis/  hamstring stretch supine)    Pain Relieving Factors  tennis ball for soft tissue    Effect of Pain on Daily Activities  limits ADL's                       [facility] Adult PT Treatment/Exercise - 09/24/18 0001      Self-Care   Self-Care  Other Self-Care Comments   Exercise guidelines  If hurts and feels bad,  stop,be gentle     Knee/Hip Exercises: Stretches   Passive Hamstring  Stretch  2 reps;20 seconds    Passive Hamstring Stretch Limitations  changed to sitting    Hip Flexor Stretch Limitations  Hold  due to pain    Piriformis Stretch Limitations  too painful to do tried several ways    Other Knee/Hip Stretches  quadratus lumborum side lying  stretches      Knee/Hip Exercises: Supine   Other Supine Knee/Hip Exercises  ball squeeze   with abdominal bracing.      Other Supine Knee/Hip Exercises  clamshells too painful to do today      Knee/Hip Exercises: Sidelying   Other Sidelying Knee/Hip Exercises  heel press 5 X 5 seconds      Modalities   Modalities  Moist Heat;Electrical Stimulation      Moist Heat Therapy   Number Minutes Moist Heat  15 Minutes    Moist Heat Location  Hip   concurrent with IFC     Electrical Stimulation   Electrical Stimulation Location  hip left/ gluteal     Electrical Stimulation Action  IFC    Electrical Stimulation Parameters  33 strong but comfortable,  usinc protocol for chronic hip pain parameters    Electrical Stimulation Goals  Pain;Tone      Manual Therapy   Manual therapy comments  she is using tennis ball at work.     Soft tissue mobilization  left hip,  low back and gluteals today  instrument assist  at times  some areas along illiac crest toos sensitive to touch             PT Education - 09/24/18 1714    Education Details  HEP,  Exercise form,  hold some exercises,  Exercise gently.    Person(s) Educated  Patient    Methods  Explanation;Demonstration;Verbal cues;Handout    Comprehension  Returned demonstration;Verbalized understanding       PT Short Term Goals - 09/17/18 2108      PT SHORT TERM GOAL #1   Title  Independent with HEP    Baseline  met with initial HEP, will update prn    Time  3    Period  Weeks    Status  Achieved      PT SHORT TERM GOAL #2   Title  Increase left hip flexion AROM 10 deg or greater to improve ability to donn shoes and perform transfers from low seating     Baseline  95 deg (5 deg improvement)    Time  3    Period  Weeks    Status  On-going        PT  Long Term Goals - 09/17/18 2107      PT LONG TERM GOAL #1   Title  Improve FOTO score to 54% or less impairment    Baseline  73% limited at eval, not retested today    Time  6    Period  Weeks    Status  On-going      PT LONG TERM GOAL #2   Title  Increase left hip and knee strength grossly 1/2 MMT or greater to improve ability for stair navigation, transfers from low chairs    Baseline  4-/5 to 4/5 at eval, not retested today    Time  6    Period  Weeks    Status  On-going      PT LONG TERM GOAL #3   Title  Tolerate standing for 10-15 min for activities such as doing dishes, showering, IADLs    Baseline  difficulty/limited due to pain, limited <5 min    Time  6    Period  Weeks    Status  On-going      PT LONG TERM GOAL #4   Title  Tolerate ambulation and periods 20-30 min for grocery shopping, IADLs    Baseline  difficulty walking >5 min due to pain    Time  6    Period  Weeks    Status  On-going            Plan - 09/24/18 1725    Clinical Impression Statement  Pain flared with working through and increasing pain with HEP.  Modification and placing some exercises on hold .  IFC helpful .  3/10 pain at end of session    PT Next Visit Plan  Further dry needling and STM as tolerated, potential use estim next session, continue hip exercises/stretches, strengthening as tolerated pending pain Check leg length equal??   PT Home Exercise Plan  hip bridge, hip add. isometric, clamshell, LAQ, hamstring and piriformis stretchesSupine quad stretch off edge of bed,  Quadratus lumborum stretch,  standing and on side.    Consulted and Agree with Plan of Care  Patient       Patient will benefit from skilled therapeutic intervention in order to improve the following deficits and impairments:     Visit Diagnosis: Pain in left hip  Stiffness of left hip, not elsewhere  classified  Pain in right hip  Muscle weakness (generalized)  Difficulty in walking, not elsewhere classified     Problem List Patient Active Problem List   Diagnosis Date Noted  . Greater trochanteric bursitis of left hip 07/24/2018  . Arthritis of left hip 06/25/2018  . Encounter for general adult medical examination with abnormal findings 01/04/2018  . Vitamin D deficiency 01/04/2018  . Lower back pain 07/18/2017  . Hemorrhoids, internal 07/18/2017  . Trigeminal neuralgia 01/18/2016  . Bipolar disorder ([place]) 11/28/2015  . Borderline personality disorder ([place]) 11/28/2015  . Carpal tunnel syndrome 09/06/2015  . MDD (major depressive disorder) 06/06/2015  . Severe episode of recurrent major depressive disorder, without psychotic features ([place])   . History of posttraumatic stress disorder (PTSD)   . GAD (generalized anxiety disorder)   . Insulin resistance 02/09/2015  . 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 Acanthosis nigricans, acquired 02/09/2015  . Nonalcoholic fatty liver disease 02/09/2015  . Hepatic cirrhosis ([place]) 02/09/2015  . Female hirsutism 02/09/2015  . Oligomenorrhea 02/09/2015  . Infertility associated with anovulation 02/09/2015  . Morbid obesity ([place]) 10/31/2014  . Anxiety and depression 10/31/2014  .  Obstructive sleep apnea 06/27/2014  . Diabetes mellitus, type 2 ([place]) 06/10/2013  . 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[place]) 01/01/2012  . Cystic teratoma   . PCOS (polycystic ovarian syndrome)   . Infertility, female   . TOBACCO USE, QUIT 02/08/2010  . HYPOTHYROIDISM, POSTSURGICAL 10/02/2007  . POLYCYSTIC OVARIAN DISEASE 10/02/2007  . Elevated cholesterol 10/02/2007  . DEPRESSION 10/02/2007  . Migraine 10/02/2007  . Essential hypertension 10/02/2007  . RENAL CALCULUS, HX OF 10/02/2007    Anajah Sterbenz  PTA 09/24/2018, 5:33 PM  [facility] [address] [place], [place], [address] Phone: [phone]   Fax:   [phone]  Name: Kylynn Street MRN: [id number] Date of Birth: 1966/03/22

## 2018-09-24 NOTE — Patient Instructions (Addendum)
Leg Extension (Hamstring)    Sit toward front edge of chair, with leg out straight, heel on floor, toes pointing toward body. Keeping back straight, bend forward at hip, breathing out through pursed lips. Return, breathing in. Repeat _3__ times. Repeat with other leg. Do _1__ sessions per day.  Hold 10 to 30 seconds.    Copyright  VHI. All rights reserved.  Hold  Piriformis stretch,  Hold anterior hip stretch

## 2018-09-28 ENCOUNTER — Encounter: Payer: Self-pay | Admitting: Physical Therapy

## 2018-09-28 ENCOUNTER — Ambulatory Visit: Payer: 59 | Admitting: Physical Therapy

## 2018-09-28 DIAGNOSIS — M6281 Muscle weakness (generalized): Secondary | ICD-10-CM

## 2018-09-28 DIAGNOSIS — R262 Difficulty in walking, not elsewhere classified: Secondary | ICD-10-CM | POA: Diagnosis not present

## 2018-09-28 DIAGNOSIS — M25552 Pain in left hip: Secondary | ICD-10-CM | POA: Diagnosis not present

## 2018-09-28 DIAGNOSIS — M25551 Pain in right hip: Secondary | ICD-10-CM | POA: Diagnosis not present

## 2018-09-28 DIAGNOSIS — M25652 Stiffness of left hip, not elsewhere classified: Secondary | ICD-10-CM | POA: Diagnosis not present

## 2018-09-28 NOTE — Therapy (Signed)
[place], [place], [address] Phone: [phone]   Fax:  [phone]  Physical Therapy Treatment  Patient Details  Name: Tracy [name] MRN: [id number] Date of Birth: [birth year] Referring Provider (PT): Hulan Saas, DO   Encounter Date: 09/28/2018  PT End of Session - 09/28/18 1622    Visit Number  6    Number of Visits  12    Date for PT Re-Evaluation  10/13/18    Authorization Type  [facility] UMR    PT Start Time  1623    PT Stop Time  1717    PT Time Calculation (min)  54 min    Activity Tolerance  Patient tolerated treatment well    Behavior During Therapy  [facility] for tasks assessed/performed       Past Medical History:  Diagnosis Date  . Anxiety   . Arthritis    back, knees, right elbow  . Bipolar disorder ([place])   . Cirrhosis ([place])   . Dental crowns present   . Depression   . Diabetes mellitus without complication ([place])   . GERD (gastroesophageal reflux disease)   . High cholesterol    no current med.  Marland Kitchen History of kidney stones   . History of migraine headaches   . Hypertension    under control with meds., has been on med. x 4 yr.  . Hypothyroidism   . NASH (nonalcoholic steatohepatitis)   . PCOS (polycystic ovarian syndrome)   . PONV (postoperative nausea and vomiting)    also hx. of emergence delirium 2007  . Sleep apnea    uses CPAP nightly  . Trigeminal neuralgia     Past Surgical History:  Procedure Laterality Date  . ACHILLES TENDON SURGERY Left 2007  . APPENDECTOMY    . CARPAL TUNNEL RELEASE Right 01/12/2016   Procedure: RIGHT CARPAL TUNNEL RELEASE;  Surgeon: Roseanne Kaufman, MD;  Location: [place];  Service: Orthopedics;  Laterality: Right;  . COLONOSCOPY WITH PROPOFOL N/A 04/20/2014   Procedure: COLONOSCOPY WITH PROPOFOL;  Surgeon: Arta Silence, MD;  [facility];  Service: Endoscopy;  Laterality: N/A;  . ESOPHAGOGASTRODUODENOSCOPY (EGD)  WITH PROPOFOL N/A 04/20/2014   Procedure: ESOPHAGOGASTRODUODENOSCOPY (EGD) WITH PROPOFOL;  Surgeon: Arta Silence, MD;  [facility];  Service: Endoscopy;  Laterality: N/A;  . EXCISION HAGLUND'S DEFORMITY WITH ACHILLES TENDON REPAIR Right 02/25/2013   Procedure: RIGHT ACHILLES DEBRIDEMENT AND RECONSTRUCTION;  HAGLUND'S EXCISION; GASTROC RECESSION AND FLEXOR HALLUCIS LONGUS TRANSFER;  Surgeon: Wylene Simmer, MD;  Location: [place];  Service: Orthopedics;  Laterality: Right;  . LUMBAR LAMINECTOMY     X 3  . neck fusion    . OVARIAN CYST SURGERY Left 1998  . PILONIDAL CYST EXCISION  1994  . RIGHT OOPHORECTOMY    . STERIOD INJECTION Left 01/12/2016   Procedure: STEROID INJECTION LEFT WRIST;  Surgeon: Roseanne Kaufman, MD;  Location: [place];  Service: Orthopedics;  Laterality: Left;  . TOTAL THYROIDECTOMY  2003  . UPPER GI ENDOSCOPY  01/19/2015    There were no vitals filed for this visit.  Subjective Assessment - 09/28/18 1623    Subjective  Pt. has been taking prednisone and reports this has helped-her last dose of this is tonight. Pain primarily in left anterior hip region. Posterolateral hip pain improving-still limited with activity tolerance but reports therapy seems to be helping.    Currently in Pain?  Yes    Pain Score  2  Pain Location  Hip    Pain Orientation  Left;Anterior;Proximal    Pain Descriptors / Indicators  Sharp    Pain Type  Chronic pain    Pain Onset  More than a month ago    Pain Frequency  Intermittent    Aggravating Factors   walking, sitting up, car transfers    Pain Relieving Factors  rest, medication    Effect of Pain on Daily Activities  Limits tolerance standing and walking for ADLs, positional tolerance.         [facility] PT Assessment - 09/28/18 0001      AROM   Left Hip Flexion  100    Left Hip External Rotation   60    Left Hip Internal Rotation   10    Left Hip ABduction  46    Left Hip ADduction  --   [facility]                   [facility] Adult PT Treatment/Exercise - 09/28/18 0001      Knee/Hip Exercises: Supine   Short Arc Quad Sets  Strengthening;Left;2 sets;10 reps    Short Arc Quad Sets Limitations  2 lbs.    Bridges  AROM;Strengthening;Both;2 sets;10 reps    Other Supine Knee/Hip Exercises  pelvic tilts x 10, hooklying hip "fall outs" x 15 reps    Other Supine Knee/Hip Exercises  hip abd left 2x10, heel slides x 20      Electrical Stimulation   Electrical Stimulation Location  left posterolateral hip/glutea region    Electrical Stimulation Action  IFC 80-150 HZ    Electrical Stimulation Parameters  to tolerance    Electrical Stimulation Goals  Tone;Pain      Manual Therapy   Joint Mobilization  left hip long axis distraction grade I-IV, hip caudal glides and ER mobiization with belt use grade I-IV    Soft tissue mobilization  STM left posterolateral hip in right sidelying               PT Short Term Goals - 09/28/18 1711      PT SHORT TERM GOAL #1   Title  Independent with HEP    Baseline  met with initial HEP, will update prn    Time  3    Period  Weeks    Status  Achieved      PT SHORT TERM GOAL #2   Title  Increase left hip flexion AROM 10 deg or greater to improve ability to donn shoes and perform transfers from low seating    Baseline  100 deg    Time  3    Period  Weeks    Status  Achieved        PT Long Term Goals - 09/28/18 1712      PT LONG TERM GOAL #1   Title  Improve FOTO score to 54% or less impairment    Baseline  73% limited at eval, not retested today    Time  6    Period  Weeks    Status  On-going      PT LONG TERM GOAL #2   Title  Increase left hip and knee strength grossly 1/2 MMT or greater to improve ability for stair navigation, transfers from low chairs    Baseline  4-/5 to 4/5 at eval, not retested today    Time  6    Period  Weeks    Status  On-going  PT LONG TERM GOAL #3   Title  Tolerate standing for 10-15 min  for activities such as doing dishes, showering, IADLs    Baseline  recent improvement but limitations ongoing    Time  6    Period  Weeks    Status  On-going      PT LONG TERM GOAL #4   Title  Tolerate ambulation and periods 20-30 min for grocery shopping, IADLs    Baseline  recent improvement but limitations ongoing    Time  6    Period  Weeks    Status  On-going            Plan - 09/28/18 1709    Clinical Impression Statement  Continue manual tx, to left posterolateral hip region due to previous benefit noted. Included joint mobs to increase left hip ROM with flexion and ER emphasis. Per objective measures left hip ROM improving from baseline. MIld subjective progress likely also assisted by medication so will await further status later this week.    Clinical Decision Making  Low    Rehab Potential  Fair    Clinical Impairments Affecting Rehab Potential  underlying OA, duration of symptoms, level of weakness and ROM limitation    PT Frequency  2x / week    PT Duration  6 weeks    PT Treatment/Interventions  ADLs/Self Care Home Management;Cryotherapy;Ultrasound;Moist Heat;Iontophoresis 65m/ml Dexamethasone;Electrical Stimulation;Therapeutic activities;Therapeutic exercise;Balance training;Gait training;Neuromuscular re-education;Functional mobility training;Manual techniques;Dry needling;Taping;Patient/family education;Passive range of motion    PT Next Visit Plan  Exercises as tolerated, joint mobs and STM as needed, dry needling prn, modalities prn    PT Home Exercise Plan  hip bridge, hip add. isometric, clamshell, LAQ, hamstring and piriformis stretchesSupine quad stretch off edge of bed,  Quadratus lumborum stretch,  standing and on side.    Consulted and Agree with Plan of Care  Patient       Patient will benefit from skilled therapeutic intervention in order to improve the following deficits and impairments:  Abnormal gait, Pain, Decreased activity tolerance, Decreased range  of motion, Decreased strength, Hypomobility, Decreased endurance, Obesity, Impaired flexibility, Difficulty walking  Visit Diagnosis: Pain in left hip  Stiffness of left hip, not elsewhere classified  Pain in right hip  Muscle weakness (generalized)  Difficulty in walking, not elsewhere classified     Problem List Patient Active Problem List   Diagnosis Date Noted  . Greater trochanteric bursitis of left hip 07/24/2018  . Arthritis of left hip 06/25/2018  . Encounter for general adult medical examination with abnormal findings 01/04/2018  . Vitamin D deficiency 01/04/2018  . Lower back pain 07/18/2017  . Hemorrhoids, internal 07/18/2017  . Trigeminal neuralgia 01/18/2016  . Bipolar disorder ([place] 11/28/2015  . Borderline personality disorder ([place] 11/28/2015  . Carpal tunnel syndrome 09/06/2015  . MDD (major depressive disorder) 06/06/2015  . Severe episode of recurrent major depressive disorder, without psychotic features ([place]   . History of posttraumatic stress disorder (PTSD)   . GAD (generalized anxiety disorder)   . Insulin resistance 02/09/2015  . 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 Acanthosis nigricans, acquired 02/09/2015  . Nonalcoholic fatty liver disease 02/09/2015  . Hepatic cirrhosis ([place] 02/09/2015  . Female hirsutism 02/09/2015  . Oligomenorrhea 02/09/2015  . Infertility associated with anovulation 02/09/2015  . Morbid obesity ([place] 10/31/2014  . Anxiety and depression 10/31/2014  . Obstructive sleep apnea 06/27/2014  . Diabetes mellitus, type 2 ([place] 06/10/2013  . 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[place] 01/01/2012  . Cystic teratoma   .  PCOS (polycystic ovarian syndrome)   . Infertility, female   . TOBACCO USE, QUIT 02/08/2010  . HYPOTHYROIDISM, POSTSURGICAL 10/02/2007  . POLYCYSTIC OVARIAN DISEASE 10/02/2007  . Elevated cholesterol 10/02/2007  . DEPRESSION 10/02/2007  . Migraine 10/02/2007  . Essential hypertension 10/02/2007  . RENAL CALCULUS, HX OF  10/02/2007   Beaulah Dinning, PT, DPT 09/28/18 5:14 PM  [facility] [address] [place], [place], [address] Phone: [phone]   Fax:  [phone]  Name: Tracy [name] MRN: [id number] Date of Birth: [birth year]

## 2018-09-29 ENCOUNTER — Ambulatory Visit: Payer: 59 | Admitting: Physical Therapy

## 2018-09-29 DIAGNOSIS — R262 Difficulty in walking, not elsewhere classified: Secondary | ICD-10-CM | POA: Diagnosis not present

## 2018-09-29 DIAGNOSIS — M6281 Muscle weakness (generalized): Secondary | ICD-10-CM | POA: Diagnosis not present

## 2018-09-29 DIAGNOSIS — M25551 Pain in right hip: Secondary | ICD-10-CM | POA: Diagnosis not present

## 2018-09-29 DIAGNOSIS — M25552 Pain in left hip: Secondary | ICD-10-CM | POA: Diagnosis not present

## 2018-09-29 DIAGNOSIS — M25652 Stiffness of left hip, not elsewhere classified: Secondary | ICD-10-CM | POA: Diagnosis not present

## 2018-09-29 NOTE — Therapy (Signed)
[place], [place], [address] Phone: [phone]   Fax:  [phone]  Physical Therapy Treatment  Patient Details  Name: Tracy [name] MRN: [id number] Date of Birth: May 16, 1966 Referring Provider (PT): Hulan Saas, DO   Encounter Date: 09/29/2018  PT End of Session - 09/29/18 1724    Visit Number  7    Number of Visits  12    Date for PT Re-Evaluation  10/13/18    Authorization Type  [facility] [employer]    PT Start Time  1629    PT Stop Time  1714    PT Time Calculation (min)  45 min    Activity Tolerance  Patient tolerated treatment well    Behavior During Therapy  [facility] for tasks assessed/performed       Past Medical History:  Diagnosis Date  . Anxiety   . Arthritis    back, knees, right elbow  . Bipolar disorder ([place])   . Cirrhosis ([place])   . Dental crowns present   . Depression   . Diabetes mellitus without complication ([place])   . GERD (gastroesophageal reflux disease)   . High cholesterol    no current med.  Marland Kitchen History of kidney stones   . History of migraine headaches   . Hypertension    under control with meds., has been on med. x 4 yr.  . Hypothyroidism   . NASH (nonalcoholic steatohepatitis)   . PCOS (polycystic ovarian syndrome)   . PONV (postoperative nausea and vomiting)    also hx. of emergence delirium 2007  . Sleep apnea    uses CPAP nightly  . Trigeminal neuralgia     Past Surgical History:  Procedure Laterality Date  . ACHILLES TENDON SURGERY Left 2007  . APPENDECTOMY    . CARPAL TUNNEL RELEASE Right 01/12/2016   Procedure: RIGHT CARPAL TUNNEL RELEASE;  Surgeon: Roseanne Kaufman, MD;  Location: [facility];  Service: Orthopedics;  Laterality: Right;  . COLONOSCOPY WITH PROPOFOL N/A 04/20/2014   Procedure: COLONOSCOPY WITH PROPOFOL;  Surgeon: Arta Silence, MD;  [facility];  Service: Endoscopy;  Laterality: N/A;  . ESOPHAGOGASTRODUODENOSCOPY (EGD)  WITH PROPOFOL N/A 04/20/2014   Procedure: ESOPHAGOGASTRODUODENOSCOPY (EGD) WITH PROPOFOL;  Surgeon: Arta Silence, MD;  [facility];  Service: Endoscopy;  Laterality: N/A;  . EXCISION HAGLUND'S DEFORMITY WITH ACHILLES TENDON REPAIR Right 02/25/2013   Procedure: RIGHT ACHILLES DEBRIDEMENT AND RECONSTRUCTION;  HAGLUND'S EXCISION; GASTROC RECESSION AND FLEXOR HALLUCIS LONGUS TRANSFER;  Surgeon: Wylene Simmer, MD;  Location: [place] [facility];  Service: Orthopedics;  Laterality: Right;  . LUMBAR LAMINECTOMY     X 3  . neck fusion    . OVARIAN CYST SURGERY Left 1998  . PILONIDAL CYST EXCISION  1994  . RIGHT OOPHORECTOMY    . STERIOD INJECTION Left 01/12/2016   Procedure: STEROID INJECTION LEFT WRIST;  Surgeon: Roseanne Kaufman, MD;  Location: [facility];  Service: Orthopedics;  Laterality: Left;  . TOTAL THYROIDECTOMY  2003  . UPPER GI ENDOSCOPY  01/19/2015    There were no vitals filed for this visit.  Subjective Assessment - 09/29/18 1718    Subjective  Pt. now done with prednisone medication. No major increase in symptoms since yesterday/minimal post-tx. soreness. Primary symptoms in left posterolateral hip region > anterior hip/groin.                       [place] Adult PT Treatment/Exercise - 09/29/18 0001  Knee/Hip Exercises: Stretches   Passive Hamstring Stretch  Left;3 reps;30 seconds    ITB Stretch  --   attempted but c/o groin pain   Piriformis Stretch  Left;3 reps;30 seconds      Knee/Hip Exercises: Supine   Bridges  Both;10 reps    Other Supine Knee/Hip Exercises  clamshell red band x 15 reps      Electrical Stimulation   Electrical Stimulation Location  Left posterolateral hip    Electrical Stimulation Action  TENS   applied via needles   Electrical Stimulation Parameters  10 pps to tolerance/visible muscle contraction    Electrical Stimulation Goals  Tone;Pain      Manual Therapy   Joint Mobilization  left hip long axis  distraction grade I-IV    Soft tissue mobilization  STM leftposterolateral hip region       Trigger Point Dry Needling - 09/29/18 1718    Consent Given?  Yes    Muscles Treated Lower Body  Gluteus minimus;Piriformis   incl. gluteus medius   Gluteus Minimus Response  Palpable increased muscle length    Piriformis Response  Palpable increased muscle length           PT Education - 09/29/18 1722    Education Details  POC, estim parameters    Person(s) Educated  Patient    Methods  Explanation    Comprehension  Verbalized understanding;Returned demonstration       PT Short Term Goals - 09/28/18 1711      PT SHORT TERM GOAL #1   Title  Independent with HEP    Baseline  met with initial HEP, will update prn    Time  3    Period  Weeks    Status  Achieved      PT SHORT TERM GOAL #2   Title  Increase left hip flexion AROM 10 deg or greater to improve ability to donn shoes and perform transfers from low seating    Baseline  100 deg    Time  3    Period  Weeks    Status  Achieved        PT Long Term Goals - 09/28/18 1712      PT LONG TERM GOAL #1   Title  Improve FOTO score to 54% or less impairment    Baseline  73% limited at eval, not retested today    Time  6    Period  Weeks    Status  On-going      PT LONG TERM GOAL #2   Title  Increase left hip and knee strength grossly 1/2 MMT or greater to improve ability for stair navigation, transfers from low chairs    Baseline  4-/5 to 4/5 at eval, not retested today    Time  6    Period  Weeks    Status  On-going      PT LONG TERM GOAL #3   Title  Tolerate standing for 10-15 min for activities such as doing dishes, showering, IADLs    Baseline  recent improvement but limitations ongoing    Time  6    Period  Weeks    Status  On-going      PT LONG TERM GOAL #4   Title  Tolerate ambulation and periods 20-30 min for grocery shopping, IADLs    Baseline  recent improvement but limitations ongoing    Time  6     Period  Weeks    Status  On-going            Plan - 09/29/18 1759    Clinical Impression Statement  Session emphasis on manual therapy and stretches to address posterolateral hip discomfort. Included dry needling to address gluteus medius/minimus and piriformis trigger points with estim for decreased pain/tone. Significant decrease in muscle tension noted post-tx. Fair status overall with hip pain-expect it may take a few more days to assess mobility status/overall tx. impact given pt. has finished prednisone prescription.    Rehab Potential  Fair    Clinical Impairments Affecting Rehab Potential  underlying OA, duration of symptoms, level of weakness and ROM limitation    PT Frequency  2x / week    PT Duration  6 weeks    PT Treatment/Interventions  ADLs/Self Care Home Management;Cryotherapy;Ultrasound;Moist Heat;Iontophoresis 64m/ml Dexamethasone;Electrical Stimulation;Therapeutic activities;Therapeutic exercise;Balance training;Gait training;Neuromuscular re-education;Functional mobility training;Manual techniques;Dry needling;Taping;Patient/family education;Passive range of motion    PT Next Visit Plan  Reassess FOTO next week, continue exercises as tolerated, joint mobs and STM as needed, dry needling prn, estim/modalities prn    PT Home Exercise Plan  hip bridge, hip add. isometric, clamshell, LAQ, hamstring and piriformis stretchesSupine quad stretch off edge of bed,  Quadratus lumborum stretch,  standing and on side.    Consulted and Agree with Plan of Care  Patient       Patient will benefit from skilled therapeutic intervention in order to improve the following deficits and impairments:  Abnormal gait, Pain, Decreased activity tolerance, Decreased range of motion, Decreased strength, Hypomobility, Decreased endurance, Obesity, Impaired flexibility, Difficulty walking  Visit Diagnosis: Pain in left hip  Stiffness of left hip, not elsewhere classified  Pain in right  hip  Muscle weakness (generalized)  Difficulty in walking, not elsewhere classified     Problem List Patient Active Problem List   Diagnosis Date Noted  . Greater trochanteric bursitis of left hip 07/24/2018  . Arthritis of left hip 06/25/2018  . Encounter for general adult medical examination with abnormal findings 01/04/2018  . Vitamin D deficiency 01/04/2018  . Lower back pain 07/18/2017  . Hemorrhoids, internal 07/18/2017  . Trigeminal neuralgia 01/18/2016  . Bipolar disorder ([place] 11/28/2015  . Borderline personality disorder ([place] 11/28/2015  . Carpal tunnel syndrome 09/06/2015  . MDD (major depressive disorder) 06/06/2015  . Severe episode of recurrent major depressive disorder, without psychotic features ([place]   . History of posttraumatic stress disorder (PTSD)   . GAD (generalized anxiety disorder)   . Insulin resistance 02/09/2015  . 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 Acanthosis nigricans, acquired 02/09/2015  . Nonalcoholic fatty liver disease 02/09/2015  . Hepatic cirrhosis ([place] 02/09/2015  . Female hirsutism 02/09/2015  . Oligomenorrhea 02/09/2015  . Infertility associated with anovulation 02/09/2015  . Morbid obesity ([place] 10/31/2014  . Anxiety and depression 10/31/2014  . Obstructive sleep apnea 06/27/2014  . Diabetes mellitus, type 2 ([place] 06/10/2013  . 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[place] 01/01/2012  . Cystic teratoma   . PCOS (polycystic ovarian syndrome)   . Infertility, female   . TOBACCO USE, QUIT 02/08/2010  . HYPOTHYROIDISM, POSTSURGICAL 10/02/2007  . POLYCYSTIC OVARIAN DISEASE 10/02/2007  . Elevated cholesterol 10/02/2007  . DEPRESSION 10/02/2007  . Migraine 10/02/2007  . Essential hypertension 10/02/2007  . RENAL CALCULUS, HX OF 10/02/2007    CBeaulah Dinning PT, DPT 09/29/18 6:03 PM  [facility] [address]Phone: [phone]  Fax:  [phone] Name: Tracy AtkinMRN: [id number] of Birth:  1966/01/23

## 2018-10-01 DIAGNOSIS — F3181 Bipolar II disorder: Secondary | ICD-10-CM | POA: Diagnosis not present

## 2018-10-02 ENCOUNTER — Encounter: Payer: Self-pay | Admitting: Family Medicine

## 2018-10-02 MED ORDER — PREDNISONE 50 MG PO TABS: 50.0000 mg | ORAL_TABLET | Freq: Every day | ORAL | 0 refills | Status: DC

## 2018-10-05 ENCOUNTER — Encounter: Payer: Self-pay | Admitting: Physical Therapy

## 2018-10-05 ENCOUNTER — Ambulatory Visit: Payer: 59 | Attending: Family Medicine | Admitting: Physical Therapy

## 2018-10-05 DIAGNOSIS — R262 Difficulty in walking, not elsewhere classified: Secondary | ICD-10-CM | POA: Diagnosis not present

## 2018-10-05 DIAGNOSIS — M25552 Pain in left hip: Secondary | ICD-10-CM | POA: Insufficient documentation

## 2018-10-05 DIAGNOSIS — M6281 Muscle weakness (generalized): Secondary | ICD-10-CM | POA: Diagnosis not present

## 2018-10-05 DIAGNOSIS — M25652 Stiffness of left hip, not elsewhere classified: Secondary | ICD-10-CM | POA: Diagnosis not present

## 2018-10-05 DIAGNOSIS — M25551 Pain in right hip: Secondary | ICD-10-CM | POA: Insufficient documentation

## 2018-10-05 NOTE — Therapy (Signed)
[place], [place], [address] Phone: [phone]   Fax:  [phone]  Physical Therapy Treatment  Patient Details  Name: Tracy [name] MRN: [id number] Date of Birth: 1965-11-07 Referring Provider (PT): Hulan Saas, DO   Encounter Date: 10/05/2018  PT End of Session - 10/05/18 1739    Visit Number  8    Number of Visits  12    Authorization Type  [facility]    PT Start Time  1550    PT Stop Time  9509    PT Time Calculation (min)  55 min    Activity Tolerance  Patient tolerated treatment well;Patient limited by pain    Behavior During Therapy  [facility] for tasks assessed/performed       Past Medical History:  Diagnosis Date  . Anxiety   . Arthritis    back, knees, right elbow  . Bipolar disorder ([place])   . Cirrhosis ([place])   . Dental crowns present   . Depression   . Diabetes mellitus without complication ([place])   . GERD (gastroesophageal reflux disease)   . High cholesterol    no current med.  Marland Kitchen History of kidney stones   . History of migraine headaches   . Hypertension    under control with meds., has been on med. x 4 yr.  . Hypothyroidism   . NASH (nonalcoholic steatohepatitis)   . PCOS (polycystic ovarian syndrome)   . PONV (postoperative nausea and vomiting)    also hx. of emergence delirium 2007  . Sleep apnea    uses CPAP nightly  . Trigeminal neuralgia     Past Surgical History:  Procedure Laterality Date  . ACHILLES TENDON SURGERY Left 2007  . APPENDECTOMY    . CARPAL TUNNEL RELEASE Right 01/12/2016   Procedure: RIGHT CARPAL TUNNEL RELEASE;  Surgeon: Roseanne Kaufman, MD;  Location: [facility];  Service: Orthopedics;  Laterality: Right;  . COLONOSCOPY WITH PROPOFOL N/A 04/20/2014   Procedure: COLONOSCOPY WITH PROPOFOL;  Surgeon: Arta Silence, MD;  [facility];  Service: Endoscopy;  Laterality: N/A;  . ESOPHAGOGASTRODUODENOSCOPY (EGD) WITH PROPOFOL N/A  04/20/2014   Procedure: ESOPHAGOGASTRODUODENOSCOPY (EGD) WITH PROPOFOL;  Surgeon: Arta Silence, MD;  [facility];  Service: Endoscopy;  Laterality: N/A;  . EXCISION HAGLUND'S DEFORMITY WITH ACHILLES TENDON REPAIR Right 02/25/2013   Procedure: RIGHT ACHILLES DEBRIDEMENT AND RECONSTRUCTION;  HAGLUND'S EXCISION; GASTROC RECESSION AND FLEXOR HALLUCIS LONGUS TRANSFER;  Surgeon: Wylene Simmer, MD;  Location: [facility];  Service: Orthopedics;  Laterality: Right;  . LUMBAR LAMINECTOMY     X 3  . neck fusion    . OVARIAN CYST SURGERY Left 1998  . PILONIDAL CYST EXCISION  1994  . RIGHT OOPHORECTOMY    . STERIOD INJECTION Left 01/12/2016   Procedure: STEROID INJECTION LEFT WRIST;  Surgeon: Roseanne Kaufman, MD;  Location: [facility];  Service: Orthopedics;  Laterality: Left;  . TOTAL THYROIDECTOMY  2003  . UPPER GI ENDOSCOPY  01/19/2015    There were no vitals filed for this visit.  Subjective Assessment - 10/05/18 1555    Subjective  Prednisone to start again today.   Pain has returned and patient is disappointed. She is doing the HEP with pain.   She Liked the IFC slightly better than Dry needle with TENS.     Currently in Pain?  Yes    Pain Score  4     to 8/10   Pain Location  Hip    Pain Orientation  Left;Anterior;Posterior    Pain Descriptors / Indicators  Aching;Shooting;Sharp    Pain Type  Chronic pain    Pain Radiating Towards  knee to hip    Aggravating Factors   kneeling on left knee  walking,  shopping ,  sitting up,  sitting down    Pain Relieving Factors  medication    Effect of Pain on Daily Activities  extra time getting up  from chair,  bed   Cannot clean her house.     Multiple Pain Sites  No                       [facility] Adult PT Treatment/Exercise - 10/05/18 0001      Self-Care   Self-Care  Other Self-Care Comments   anatomy muscle chart and  skeleton used   Other Self-Care Comments   suggested using cane for pain  control.   Do not over do things while on medication.       Knee/Hip Exercises: Stretches   Active Hamstring Stretch Limitations  sitting 3 X 30 seconds.    sitting   Piriformis Stretch  3 reps     10 to 30 seconds.     Piriformis Stretch Limitations  sitting foot on opposite knee  letting leg relax toward floor    Other Knee/Hip Stretches  knee semi to chest ( hip is in ER)  3 X 10 seconds.     painful initially  then eased.      Knee/Hip Exercises: Standing   Other Standing Knee Exercises  --      Knee/Hip Exercises: Seated   Knee/Hip Flexion  5 reps    Other Seated Knee/Hip Exercises  knee flexed sittinh with hip IR/  ER 5 X each with 1-2 second hold.    Other Seated Knee/Hip Exercises  long leg ER   /  5 X IR  limited  ROM.      Moist Heat Therapy   Number Minutes Moist Heat  15 Minutes    Moist Heat Location  Hip   concurrent with IFC ( Old moist heat pad)     Electrical Stimulation   Electrical Stimulation Location  Left posterolateral hip    Electrical Stimulation Action  IFC    Electrical Stimulation Parameters  22   strong tickle   Electrical Stimulation Goals  Tone;Pain      Manual Therapy   Soft tissue mobilization  lateral hip,  Tried to massage quadratus lumborum too pain ful. LT.  tissue softened less sensitive.  instrument assist at times.               PT Education - 10/05/18 1739    Education Details  self care    Person(s) Educated  Patient    Methods  Explanation;Demonstration;Tactile cues;Verbal cues    Comprehension  Returned demonstration;Verbalized understanding       PT Short Term Goals - 10/05/18 1746      PT SHORT TERM GOAL #1   Title  Independent with HEP    Time  3    Period  Weeks    Status  Achieved      PT SHORT TERM GOAL #2   Title  Increase left hip flexion AROM 10 deg or greater to improve ability to donn shoes and perform transfers from low seating    Time  3    Period  Weeks    Status  Achieved        PT Long Term  Goals - 09/28/18 1712      PT LONG TERM GOAL #1   Title  Improve FOTO score to 54% or less impairment    Baseline  73% limited at eval, not retested today    Time  6    Period  Weeks    Status  On-going      PT LONG TERM GOAL #2   Title  Increase left hip and knee strength grossly 1/2 MMT or greater to improve ability for stair navigation, transfers from low chairs    Baseline  4-/5 to 4/5 at eval, not retested today    Time  6    Period  Weeks    Status  On-going      PT LONG TERM GOAL #3   Title  Tolerate standing for 10-15 min for activities such as doing dishes, showering, IADLs    Baseline  recent improvement but limitations ongoing    Time  6    Period  Weeks    Status  On-going      PT LONG TERM GOAL #4   Title  Tolerate ambulation and periods 20-30 min for grocery shopping, IADLs    Baseline  recent improvement but limitations ongoing    Time  6    Period  Weeks    Status  On-going            Plan - 10/05/18 1740    Clinical Impression Statement  Pain continues.  She is to start another round of Steriods today. ( last time they helped but did not last)  Gentle ROM exercises to tolerance.  She continues to need cues for current HEP for gentle stretches vs how much she can tolerate.      PT Home Exercise Plan  hip bridge, hip add. isometric, clamshell, LAQ, hamstring and piriformis stretchesSupine quad stretch off edge of bed,  Quadratus lumborum stretch,  standing and on side.    Consulted and Agree with Plan of Care  Patient       Patient will benefit from skilled therapeutic intervention in order to improve the following deficits and impairments:     Visit Diagnosis: Pain in left hip  Stiffness of left hip, not elsewhere classified  Pain in right hip  Muscle weakness (generalized)  Difficulty in walking, not elsewhere classified     Problem List Patient Active Problem List   Diagnosis Date Noted  . Greater trochanteric bursitis of left hip  07/24/2018  . Arthritis of left hip 06/25/2018  . Encounter for general adult medical examination with abnormal findings 01/04/2018  . Vitamin D deficiency 01/04/2018  . Lower back pain 07/18/2017  . Hemorrhoids, internal 07/18/2017  . Trigeminal neuralgia 01/18/2016  . Bipolar disorder ([place]) 11/28/2015  . Borderline personality disorder ([place]) 11/28/2015  . Carpal tunnel syndrome 09/06/2015  . MDD (major depressive disorder) 06/06/2015  . Severe episode of recurrent major depressive disorder, without psychotic features ([place])   . History of posttraumatic stress disorder (PTSD)   . GAD (generalized anxiety disorder)   . Insulin resistance 02/09/2015  . 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 Acanthosis nigricans, acquired 02/09/2015  . Nonalcoholic fatty liver disease 02/09/2015  . Hepatic cirrhosis ([place]) 02/09/2015  . Female hirsutism 02/09/2015  . Oligomenorrhea 02/09/2015  . Infertility associated with anovulation 02/09/2015  . Morbid obesity ([place]) 10/31/2014  . Anxiety and depression 10/31/2014  . Obstructive sleep apnea 06/27/2014  . Diabetes mellitus,  type 2 ([place]) 06/10/2013  . 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[place]) 01/01/2012  . Cystic teratoma   . PCOS (polycystic ovarian syndrome)   . Infertility, female   . TOBACCO USE, QUIT 02/08/2010  . HYPOTHYROIDISM, POSTSURGICAL 10/02/2007  . POLYCYSTIC OVARIAN DISEASE 10/02/2007  . Elevated cholesterol 10/02/2007  . DEPRESSION 10/02/2007  . Migraine 10/02/2007  . Essential hypertension 10/02/2007  . RENAL CALCULUS, HX OF 10/02/2007    , PTA 10/05/2018, 5:48 PM  [facility] [address] [place], [place], [address] Phone: [phone]   Fax:  [phone]  Name: Tracy [name] MRN: [id number] Date of Birth: 1965-12-26

## 2018-10-07 ENCOUNTER — Ambulatory Visit: Payer: 59 | Admitting: Physical Therapy

## 2018-10-07 ENCOUNTER — Encounter: Payer: Self-pay | Admitting: Physical Therapy

## 2018-10-07 DIAGNOSIS — M25652 Stiffness of left hip, not elsewhere classified: Secondary | ICD-10-CM

## 2018-10-07 DIAGNOSIS — M6281 Muscle weakness (generalized): Secondary | ICD-10-CM

## 2018-10-07 DIAGNOSIS — M25551 Pain in right hip: Secondary | ICD-10-CM

## 2018-10-07 DIAGNOSIS — R262 Difficulty in walking, not elsewhere classified: Secondary | ICD-10-CM

## 2018-10-07 DIAGNOSIS — M25552 Pain in left hip: Secondary | ICD-10-CM

## 2018-10-07 NOTE — Therapy (Signed)
[place], [place], [address] Phone: [phone]   Fax:  [phone]  Physical Therapy Treatment  Patient Details  Name: Tracy [name] MRN: [id number] Date of Birth: 12-21-1965 Referring Provider (PT): Hulan Saas, DO   Encounter Date: 10/07/2018  PT End of Session - 10/07/18 1637    Visit Number  9    Number of Visits  12    Date for PT Re-Evaluation  10/13/18    Authorization Type  [facility] [employer]    PT Start Time  1545    PT Stop Time  1628    PT Time Calculation (min)  43 min    Activity Tolerance  Patient tolerated treatment well    Behavior During Therapy  [facility] for tasks assessed/performed       Past Medical History:  Diagnosis Date  . Anxiety   . Arthritis    back, knees, right elbow  . Bipolar disorder ([place])   . Cirrhosis ([place])   . Dental crowns present   . Depression   . Diabetes mellitus without complication ([place])   . GERD (gastroesophageal reflux disease)   . High cholesterol    no current med.  Marland Kitchen History of kidney stones   . History of migraine headaches   . Hypertension    under control with meds., has been on med. x 4 yr.  . Hypothyroidism   . NASH (nonalcoholic steatohepatitis)   . PCOS (polycystic ovarian syndrome)   . PONV (postoperative nausea and vomiting)    also hx. of emergence delirium 2007  . Sleep apnea    uses CPAP nightly  . Trigeminal neuralgia     Past Surgical History:  Procedure Laterality Date  . ACHILLES TENDON SURGERY Left 2007  . APPENDECTOMY    . CARPAL TUNNEL RELEASE Right 01/12/2016   Procedure: RIGHT CARPAL TUNNEL RELEASE;  Surgeon: Roseanne Kaufman, MD;  Location: [facility];  Service: Orthopedics;  Laterality: Right;  . COLONOSCOPY WITH PROPOFOL N/A 04/20/2014   Procedure: COLONOSCOPY WITH PROPOFOL;  Surgeon: Arta Silence, MD;  Location: [facility];  Service: Endoscopy;  Laterality: N/A;  . ESOPHAGOGASTRODUODENOSCOPY (EGD)  WITH PROPOFOL N/A 04/20/2014   Procedure: ESOPHAGOGASTRODUODENOSCOPY (EGD) WITH PROPOFOL;  Surgeon: Arta Silence, MD;  Location: [facility];  Service: Endoscopy;  Laterality: N/A;  . EXCISION HAGLUND'S DEFORMITY WITH ACHILLES TENDON REPAIR Right 02/25/2013   Procedure: RIGHT ACHILLES DEBRIDEMENT AND RECONSTRUCTION;  HAGLUND'S EXCISION; GASTROC RECESSION AND FLEXOR HALLUCIS LONGUS TRANSFER;  Surgeon: Wylene Simmer, MD;  Location: [facility];  Service: Orthopedics;  Laterality: Right;  . LUMBAR LAMINECTOMY     X 3  . neck fusion    . OVARIAN CYST SURGERY Left 1998  . PILONIDAL CYST EXCISION  1994  . RIGHT OOPHORECTOMY    . STERIOD INJECTION Left 01/12/2016   Procedure: STEROID INJECTION LEFT WRIST;  Surgeon: Roseanne Kaufman, MD;  Location: [facility];  Service: Orthopedics;  Laterality: Left;  . TOTAL THYROIDECTOMY  2003  . UPPER GI ENDOSCOPY  01/19/2015    There were no vitals filed for this visit.  Subjective Assessment - 10/07/18 1554    Subjective  Meds started steriods and pain is 1/10.                        [facility] Adult PT Treatment/Exercise - 10/07/18 0001      Self-Care   Other Self-Care Comments   heel lift in right  shoe trial  .  gait smoother .  trial      Lumbar Exercises: Stretches   Double Knee to Chest Stretch  5 reps    Double Knee to Chest Stretch Limitations  feet on red ball  painful in groin.Marland Kitchen  able to t      Lumbar Exercises: Seated   Other Seated Lumbar Exercises  isometric abdominals with both and single hand presses into knees,  also tried isometric hip flexion sitting single 5 X each able to do without pain.  Posture improved.       Knee/Hip Exercises: Aerobic   Nustep  L4  UE/LE  5 minutes   95steps per minute.      Knee/Hip Exercises: Supine   Bridges  10 reps    Other Supine Knee/Hip Exercises  Isometric hip abduction 10 X 5 seconds manually resisted.  10 X   ball squeeze hooklying 10 x 5 seconds   Other  Supine Knee/Hip Exercises  abdominal bracing with 3 inch bent knee lift and hold .  and small stepping walk outs  X5 cued small range     supine legs straight Hip IR/ER  cued painfree,  very limited     Manual Therapy   Joint Mobilization  5 X Gr 1 long axis distraction   left            PT Education - 10/07/18 1637    Education Details  heel lift use/ trial.    Person(s) Educated  Patient    Methods  Explanation    Comprehension  Verbalized understanding       PT Short Term Goals - 10/05/18 1746      PT SHORT TERM GOAL #1   Title  Independent with HEP    Time  3    Period  Weeks    Status  Achieved      PT SHORT TERM GOAL #2   Title  Increase left hip flexion AROM 10 deg or greater to improve ability to donn shoes and perform transfers from low seating    Time  3    Period  Weeks    Status  Achieved        PT Long Term Goals - 09/28/18 1712      PT LONG TERM GOAL #1   Title  Improve FOTO score to 54% or less impairment    Baseline  73% limited at eval, not retested today    Time  6    Period  Weeks    Status  On-going      PT LONG TERM GOAL #2   Title  Increase left hip and knee strength grossly 1/2 MMT or greater to improve ability for stair navigation, transfers from low chairs    Baseline  4-/5 to 4/5 at eval, not retested today    Time  6    Period  Weeks    Status  On-going      PT LONG TERM GOAL #3   Title  Tolerate standing for 10-15 min for activities such as doing dishes, showering, IADLs    Baseline  recent improvement but limitations ongoing    Time  6    Period  Weeks    Status  On-going      PT LONG TERM GOAL #4   Title  Tolerate ambulation and periods 20-30 min for grocery shopping, IADLs    Baseline  recent improvement but limitations ongoing    Time  6    Period  Weeks    Status  On-going            Plan - 10/07/18 1638    Clinical Impression Statement  Mild pain increased briefly during exercises.  Pain adressed with  change of position or technique.   Trial of 2 ply heel lift in right shoe.   With smother gait.  Pain greatly improve while she is on steriods.   PROM to about 93 flexion.     PT Next Visit Plan  Reassess (I did not do) FOTO next visit,  ask about heel lift.  continue exercises as tolerated, joint mobs and STM as needed, dry needling prn, estim/modalities prn    PT Home Exercise Plan  hip bridge, hip add. isometric, clamshell, LAQ, hamstring and piriformis stretchesSupine quad stretch off edge of bed,  Quadratus lumborum stretch,  standing and on side.    Consulted and Agree with Plan of Care  Patient       Patient will benefit from skilled therapeutic intervention in order to improve the following deficits and impairments:     Visit Diagnosis: Pain in left hip  Stiffness of left hip, not elsewhere classified  Pain in right hip  Muscle weakness (generalized)  Difficulty in walking, not elsewhere classified     Problem List Patient Active Problem List   Diagnosis Date Noted  . Greater trochanteric bursitis of left hip 07/24/2018  . Arthritis of left hip 06/25/2018  . Encounter for general adult medical examination with abnormal findings 01/04/2018  . Vitamin D deficiency 01/04/2018  . Lower back pain 07/18/2017  . Hemorrhoids, internal 07/18/2017  . Trigeminal neuralgia 01/18/2016  . Bipolar disorder ([place]) 11/28/2015  . Borderline personality disorder ([place]) 11/28/2015  . Carpal tunnel syndrome 09/06/2015  . MDD (major depressive disorder) 06/06/2015  . Severe episode of recurrent major depressive disorder, without psychotic features ([place])   . History of posttraumatic stress disorder (PTSD)   . GAD (generalized anxiety disorder)   . Insulin resistance 02/09/2015  . 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 Acanthosis nigricans, acquired 02/09/2015  . Nonalcoholic fatty liver disease 02/09/2015  . Hepatic cirrhosis ([place]) 02/09/2015  . Female hirsutism 02/09/2015  . Oligomenorrhea  02/09/2015  . Infertility associated with anovulation 02/09/2015  . Morbid obesity ([place]) 10/31/2014  . Anxiety and depression 10/31/2014  . Obstructive sleep apnea 06/27/2014  . Diabetes mellitus, type 2 ([place]) 06/10/2013  . 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[place]) 01/01/2012  . Cystic teratoma   . PCOS (polycystic ovarian syndrome)   . Infertility, female   . TOBACCO USE, QUIT 02/08/2010  . HYPOTHYROIDISM, POSTSURGICAL 10/02/2007  . POLYCYSTIC OVARIAN DISEASE 10/02/2007  . Elevated cholesterol 10/02/2007  . DEPRESSION 10/02/2007  . Migraine 10/02/2007  . Essential hypertension 10/02/2007  . RENAL CALCULUS, HX OF 10/02/2007    ,  PTA 10/07/2018, 4:43 PM  [facility] [address] [place], [place], [address] Phone: [phone]   Fax:  [phone]  Name: Tracy [name] MRN: [id number] Date of Birth: 01-15-1966

## 2018-10-07 NOTE — Patient Instructions (Signed)
Remove heel lift if irritating

## 2018-10-08 ENCOUNTER — Encounter (INDEPENDENT_AMBULATORY_CARE_PROVIDER_SITE_OTHER): Payer: Self-pay | Admitting: "Endocrinology

## 2018-10-08 ENCOUNTER — Ambulatory Visit (INDEPENDENT_AMBULATORY_CARE_PROVIDER_SITE_OTHER): Payer: 59 | Admitting: "Endocrinology

## 2018-10-08 VITALS — BP 116/70 | HR 88 | Ht 70.08 in | Wt 257.0 lb

## 2018-10-08 DIAGNOSIS — L83 Acanthosis nigricans: Secondary | ICD-10-CM | POA: Diagnosis not present

## 2018-10-08 DIAGNOSIS — R600 Localized edema: Secondary | ICD-10-CM

## 2018-10-08 DIAGNOSIS — R5383 Other fatigue: Secondary | ICD-10-CM

## 2018-10-08 DIAGNOSIS — E890 Postprocedural hypothyroidism: Secondary | ICD-10-CM

## 2018-10-08 DIAGNOSIS — E119 Type 2 diabetes mellitus without complications: Secondary | ICD-10-CM | POA: Diagnosis not present

## 2018-10-08 DIAGNOSIS — I10 Essential (primary) hypertension: Secondary | ICD-10-CM | POA: Diagnosis not present

## 2018-10-08 DIAGNOSIS — K760 Fatty (change of) liver, not elsewhere classified: Secondary | ICD-10-CM | POA: Diagnosis not present

## 2018-10-08 DIAGNOSIS — R1013 Epigastric pain: Secondary | ICD-10-CM

## 2018-10-08 DIAGNOSIS — R112 Nausea with vomiting, unspecified: Secondary | ICD-10-CM

## 2018-10-08 DIAGNOSIS — N914 Secondary oligomenorrhea: Secondary | ICD-10-CM

## 2018-10-08 DIAGNOSIS — F329 Major depressive disorder, single episode, unspecified: Secondary | ICD-10-CM

## 2018-10-08 DIAGNOSIS — E282 Polycystic ovarian syndrome: Secondary | ICD-10-CM

## 2018-10-08 DIAGNOSIS — B353 Tinea pedis: Secondary | ICD-10-CM

## 2018-10-08 DIAGNOSIS — E11649 Type 2 diabetes mellitus with hypoglycemia without coma: Secondary | ICD-10-CM | POA: Diagnosis not present

## 2018-10-08 DIAGNOSIS — F419 Anxiety disorder, unspecified: Secondary | ICD-10-CM

## 2018-10-08 DIAGNOSIS — R000 Tachycardia, unspecified: Secondary | ICD-10-CM

## 2018-10-08 DIAGNOSIS — I4711 Inappropriate sinus tachycardia, so stated: Secondary | ICD-10-CM

## 2018-10-08 LAB — POCT GLYCOSYLATED HEMOGLOBIN (HGB A1C): Hemoglobin A1C: 5.1 % (ref 4.0–5.6)

## 2018-10-08 LAB — POCT GLUCOSE (DEVICE FOR HOME USE): POC Glucose: 147 mg/dl — AB (ref 70–99)

## 2018-10-08 MED ORDER — RABEPRAZOLE SODIUM 20 MG PO TBEC: DELAYED_RELEASE_TABLET | ORAL | 3 refills | Status: DC

## 2018-10-08 NOTE — Patient Instructions (Signed)
Follow up visit in 3 months. Take rabeprazole twice daily for 2-4 weeks, then reduce to once a day.

## 2018-10-08 NOTE — Progress Notes (Signed)
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 been due to worsening PCOS, but could also be due to her age and perimenopausal status. Her serum testosterone was elevated in September 2019, but at the lower end of the range that we usually see in PCOS. Her androstenedione and DHEAS were well within the normal ranges.   B. 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 This degree of oligomenorrhea and infertility is common in women with severe PCOS/SLS.   C. What I don't understand, however, is why she suddenly began having regular menses in January 2016, during the same time period that her BGs suddenly came under control. While I doubt that starting C-pap treatment produced these dramatic improvements, I don't have another explanation. I wonder if she had been hypercortisolemic in the past. If she had had severe, chronic Cushing's disease, we should have seen severe hyperpigmentation, which has not been present. If she had had severe, chronic Cushing's syndrome due to autonomous adrenal nodules, I would not expect a sudden reversal. If she had had a cortisol-producing mass lesion, it should have shown up on CT of her  abdomen, but did not. If she had had a severe prolactinoma as the cause of her oligomenorrhea, her prolactin should have been elevated and the tumor should have been a macroprolactinoma and should have been visible on prior images of the brain. A macroprolactinoma, however, would not usually have contributed to T2DM.   D. When she lost weight, her oligomenorrhea improved. She had been having menses almost regularly for the past year prior to her last visit. However, when she regained weight, the oligomenorrhea worsened and she skipped periods in May, in July, and in November 2018. Then when she lost more weight this year, her periods resumed.   E. Her last period was in January. We will see if she is now in menopause. 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 USE PROTECTION.  14. Fatigue: She has much less fatigue now.  15. Edema: This problem is not evident today.   16. Tachycardia: She is not tachycardic today. 17. Tinea pedis: Her tinea pedis is much better today.   PLAN:  1. Diagnostic: Check TFTs now. 2. Therapeutic: Continue ketoconazole 2% cream, twice daily on her feet. As needed. Continue Synthroid doses of 300 mcg/day on 3 days each week and 200 mcg/day on 4 days each week. Continue to hold Lantus. Stop ranitidine and Prilosec. Start rabeprazole, 20 mg, twice daily, but taper to 20 mg once daily as tolerated.   Eat Right Diet. Exercise daily as tolerated, with a goal of gradually increasing her duration of exercise to at least one hour per day. Consider adding spironolactone. 3. Patient education: See Ms Matilde Sprang and  Ms Dorethea Clan regularly. We discussed all of the above at great length. We discussed her post-surgical hypothyroidism, T2DM, hypertension, and dyspepsia, to include the need for her to take her medications for these problems consistently and reliably. 4. Follow-up: 3 months  Level of Service:  This visit lasted in excess of 70 minutes. More than 50% of the visit was devoted to counseling.  Tillman Sers, MD, CDE Adult and Pediatric Endocrinology

## 2018-10-09 LAB — T3, FREE: T3 FREE: 2.7 pg/mL (ref 2.3–4.2)

## 2018-10-09 LAB — T4, FREE: FREE T4: 1.5 ng/dL (ref 0.8–1.8)

## 2018-10-09 LAB — TSH: TSH: 0.24 mIU/L — ABNORMAL LOW

## 2018-10-13 ENCOUNTER — Encounter (INDEPENDENT_AMBULATORY_CARE_PROVIDER_SITE_OTHER): Payer: Self-pay | Admitting: *Deleted

## 2018-10-14 ENCOUNTER — Ambulatory Visit: Payer: 59 | Admitting: Physical Therapy

## 2018-10-14 ENCOUNTER — Other Ambulatory Visit: Payer: Self-pay | Admitting: Family Medicine

## 2018-10-14 ENCOUNTER — Encounter: Payer: Self-pay | Admitting: Physical Therapy

## 2018-10-14 ENCOUNTER — Other Ambulatory Visit: Payer: Self-pay

## 2018-10-14 DIAGNOSIS — M6281 Muscle weakness (generalized): Secondary | ICD-10-CM | POA: Diagnosis not present

## 2018-10-14 DIAGNOSIS — M25552 Pain in left hip: Secondary | ICD-10-CM

## 2018-10-14 DIAGNOSIS — M25652 Stiffness of left hip, not elsewhere classified: Secondary | ICD-10-CM | POA: Diagnosis not present

## 2018-10-14 DIAGNOSIS — M25551 Pain in right hip: Secondary | ICD-10-CM | POA: Diagnosis not present

## 2018-10-14 DIAGNOSIS — R262 Difficulty in walking, not elsewhere classified: Secondary | ICD-10-CM

## 2018-10-14 NOTE — Therapy (Signed)
[place], [place], [address] Phone: [phone]   Fax:  [phone]  Physical Therapy Treatment/Discharge Summary  Patient Details  Name: Tracy [name] MRN: [id number] Date of Birth: 1966/06/08 Referring Provider (PT): Hulan Saas, DO   Encounter Date: 10/14/2018  PT End of Session - 10/14/18 1757    Visit Number  10    Number of Visits  12    Date for PT Re-Evaluation  10/13/18    Authorization Type  [facility] [employer]    PT Start Time  9021    PT Stop Time  1746    PT Time Calculation (min)  48 min    Activity Tolerance  --   exercises as performed per flowsheet well-tolerated but overall activity tolerance still limited by pain   Behavior During Therapy  [facility] for tasks assessed/performed       Past Medical History:  Diagnosis Date  . Anxiety   . Arthritis    back, knees, right elbow  . Bipolar disorder ([place])   . Cirrhosis ([place])   . Dental crowns present   . Depression   . Diabetes mellitus without complication ([place])   . GERD (gastroesophageal reflux disease)   . High cholesterol    no current med.  Marland Kitchen History of kidney stones   . History of migraine headaches   . Hypertension    under control with meds., has been on med. x 4 yr.  . Hypothyroidism   . NASH (nonalcoholic steatohepatitis)   . PCOS (polycystic ovarian syndrome)   . PONV (postoperative nausea and vomiting)    also hx. of emergence delirium 2007  . Sleep apnea    uses CPAP nightly  . Trigeminal neuralgia     Past Surgical History:  Procedure Laterality Date  . ACHILLES TENDON SURGERY Left 2007  . APPENDECTOMY    . CARPAL TUNNEL RELEASE Right 01/12/2016   Procedure: RIGHT CARPAL TUNNEL RELEASE;  Surgeon: Roseanne Kaufman, MD;  Location: [facility];  Service: Orthopedics;  Laterality: Right;  . COLONOSCOPY WITH PROPOFOL N/A 04/20/2014   Procedure: COLONOSCOPY WITH PROPOFOL;  Surgeon: Arta Silence, MD;   [facility];  Service: Endoscopy;  Laterality: N/A;  . ESOPHAGOGASTRODUODENOSCOPY (EGD) WITH PROPOFOL N/A 04/20/2014   Procedure: ESOPHAGOGASTRODUODENOSCOPY (EGD) WITH PROPOFOL;  Surgeon: Arta Silence, MD;  [facility];  Service: Endoscopy;  Laterality: N/A;  . EXCISION HAGLUND'S DEFORMITY WITH ACHILLES TENDON REPAIR Right 02/25/2013   Procedure: RIGHT ACHILLES DEBRIDEMENT AND RECONSTRUCTION;  HAGLUND'S EXCISION; GASTROC RECESSION AND FLEXOR HALLUCIS LONGUS TRANSFER;  Surgeon: Wylene Simmer, MD;  Location: [facility];  Service: Orthopedics;  Laterality: Right;  . LUMBAR LAMINECTOMY     X 3  . neck fusion    . OVARIAN CYST SURGERY Left 1998  . PILONIDAL CYST EXCISION  1994  . RIGHT OOPHORECTOMY    . STERIOD INJECTION Left 01/12/2016   Procedure: STEROID INJECTION LEFT WRIST;  Surgeon: Roseanne Kaufman, MD;  Location: [facility];  Service: Orthopedics;  Laterality: Left;  . TOTAL THYROIDECTOMY  2003  . UPPER GI ENDOSCOPY  01/19/2015    There were no vitals filed for this visit.  Subjective Assessment - 10/14/18 1750    Subjective  Pt. finished last round of steroid medication last Friday and reports pain has subsequently increased with moderate 4-5/10 pain this PM in anterior hip and groin region. See plan-given continued pain and functional limitations despite therapy efforts to date recommend follow  up with Dr. Tamala Julian for further assessment/tx. options.    Pertinent History  multi-year history symptoms, no significant improvement after injections, obesity, medical comorbidities    Limitations  Sitting;Lifting;Standing;Walking;House hold activities    Patient Stated Goals  Walk without pain    Currently in Pain?  Yes    Pain Score  5     Pain Location  Hip    Pain Orientation  Left;Anterior    Pain Descriptors / Indicators  Aching;Sharp;Shooting    Pain Type  Chronic pain    Pain Radiating Towards  left anterior hip and groin region into left  thigh, intermittent posterolateral hip pain    Pain Onset  More than a month ago    Pain Frequency  Intermittent    Aggravating Factors   weightbearing, walking, standing, hip ROM into flexion and ER/IR    Pain Relieving Factors  medication and rest    Effect of Pain on Daily Activities  Limits functional abilities for standing, ambulation, and ADLs requiring hip ROM such as donning shoes         [facility] PT Assessment - 10/14/18 0001      Observation/Other Assessments   Focus on Therapeutic Outcomes (FOTO)   60% limited      AROM   Left Hip Flexion  104    Left Hip External Rotation   55    Left Hip Internal Rotation   10    Left Hip ABduction  45      Strength   Right Hip Flexion  5/5    Right Hip External Rotation   5/5    Right Hip Internal Rotation  5/5    Left Hip Flexion  4+/5    Left Hip External Rotation  4/5    Left Hip Internal Rotation  4/5    Left Hip ABduction  4/5    Right Knee Flexion  5/5    Right Knee Extension  5/5    Left Knee Flexion  5/5    Left Knee Extension  4+/5                   [facility] Adult PT Treatment/Exercise - 10/14/18 0001      Knee/Hip Exercises: Supine   Bridges  Both;15 reps    Other Supine Knee/Hip Exercises   hooklying posterior pelvic tilt x 10 in hooklying and x 10 with assisted long axis distraction     Other Supine Knee/Hip Exercises  clamshell green band 2x10, abd. bracing with alt. LE marches x 10      Manual Therapy   Joint Mobilization  Long axis left hip distraction occilations grade I-IV, left hip mobilization with belt including caudal glides grade I-IV and ER mobilization grade III, IR mobilization grade I-II             PT Education - 10/14/18 1756    Education Details  POC, HEP-use as helpful/needed but hold off if exacerbating symptoms    Person(s) Educated  Patient    Methods  Explanation;Verbal cues;Handout    Comprehension  Verbalized understanding;Returned demonstration       PT Short Term  Goals - 10/14/18 1802      PT SHORT TERM GOAL #1   Title  Independent with HEP    Baseline  met    Time  3    Period  Weeks    Status  Achieved      PT SHORT TERM GOAL #2   Title  Increase  left hip flexion AROM 10 deg or greater to improve ability to donn shoes and perform transfers from low seating    Baseline  104 deg    Time  3    Period  Weeks    Status  Achieved        PT Long Term Goals - 10/14/18 1803      PT LONG TERM GOAL #1   Title  Improve FOTO score to 54% or less impairment    Baseline  60% limited    Time  6    Period  Weeks    Status  Not Met      PT LONG TERM GOAL #2   Title  Increase left hip and knee strength grossly 1/2 MMT or greater to improve ability for stair navigation, transfers from low chairs    Baseline  see flowsheet    Time  6    Period  Weeks    Status  Partially Met      PT LONG TERM GOAL #3   Title  Tolerate standing for 10-15 min for activities such as doing dishes, showering, IADLs    Baseline  continued limitations-status improved while on steroids but symptoms persist    Time  6    Period  Weeks    Status  Not Met      PT LONG TERM GOAL #4   Title  Tolerate ambulation and periods 20-30 min for grocery shopping, IADLs    Baseline  continued limitations    Time  6    Period  Weeks    Status  Not Met            Plan - 10/14/18 1758    Clinical Impression Statement  Mild improvements from baseline with left hip ROM and strength as well as FOTO score but pt. continues with significant hip pain and associated functional limitations for mobility. Capular pattern of ROM restriction and associated pain consistent witih intrinsic joint issue. Given continued limitations recommend pt. to follow up with Dr. Tamala Julian for further assessment/tx. options.    Stability/Clinical Decision Making  Evolving/Moderate complexity    Clinical Decision Making  Moderate    Rehab Potential  Fair    Clinical Impairments Affecting Rehab Potential   underlying OA, duration of symptoms, level of weakness and ROM limitation    PT Frequency  --   NA-d/c today   PT Duration  --   NA-d/c today   PT Treatment/Interventions  ADLs/Self Care Home Management;Cryotherapy;Ultrasound;Moist Heat;Iontophoresis 76m/ml Dexamethasone;Electrical Stimulation;Therapeutic activities;Therapeutic exercise;Balance training;Gait training;Neuromuscular re-education;Functional mobility training;Manual techniques;Dry needling;Taping;Patient/family education;Passive range of motion    PT Next Visit Plan  NA-d/c    PT Home Exercise Plan  see handout    Consulted and Agree with Plan of Care  Patient       Patient will benefit from skilled therapeutic intervention in order to improve the following deficits and impairments:  Abnormal gait, Pain, Decreased activity tolerance, Decreased range of motion, Decreased strength, Hypomobility, Decreased endurance, Obesity, Impaired flexibility, Difficulty walking  Visit Diagnosis: Pain in left hip  Stiffness of left hip, not elsewhere classified  Pain in right hip  Muscle weakness (generalized)  Difficulty in walking, not elsewhere classified     Problem List Patient Active Problem List   Diagnosis Date Noted  . Greater trochanteric bursitis of left hip 07/24/2018  . Arthritis of left hip 06/25/2018  . Encounter for general adult medical examination with abnormal findings 01/04/2018  . Vitamin D  deficiency 01/04/2018  . Lower back pain 07/18/2017  . Hemorrhoids, internal 07/18/2017  . Trigeminal neuralgia 01/18/2016  . Bipolar disorder ([place]) 11/28/2015  . Borderline personality disorder ([place]) 11/28/2015  . Carpal tunnel syndrome 09/06/2015  . MDD (major depressive disorder) 06/06/2015  . Severe episode of recurrent major depressive disorder, without psychotic features ([place])   . History of posttraumatic stress disorder (PTSD)   . GAD (generalized anxiety disorder)   . Insulin resistance 02/09/2015  .  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 Acanthosis nigricans, acquired 02/09/2015  . Nonalcoholic fatty liver disease 02/09/2015  . Hepatic cirrhosis ([place]) 02/09/2015  . Female hirsutism 02/09/2015  . Oligomenorrhea 02/09/2015  . Infertility associated with anovulation 02/09/2015  . Morbid obesity ([place]) 10/31/2014  . Anxiety and depression 10/31/2014  . Obstructive sleep apnea 06/27/2014  . Diabetes mellitus, type 2 ([place]) 06/10/2013  . 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 Cystic teratoma   . PCOS (polycystic ovarian syndrome)   . Infertility, female   . TOBACCO USE, QUIT 02/08/2010  . HYPOTHYROIDISM, POSTSURGICAL 10/02/2007  . POLYCYSTIC OVARIAN DISEASE 10/02/2007  . Elevated cholesterol 10/02/2007  . DEPRESSION 10/02/2007  . Migraine 10/02/2007  . Essential hypertension 10/02/2007  . RENAL CALCULUS, HX OF 10/02/2007      PHYSICAL THERAPY DISCHARGE SUMMARY  Visits from Start of Care: 10  Current functional level related to goals / functional outcomes: Continues with left hip pain and associated functional limitations for mobility   Remaining deficits: Left hip pain, decreased ROM/stiffness, muscle weakness   Education / Equipment: HEP, POC  Plan: Patient agrees to discharge.  Patient goals were partially met. Patient is being discharged due to lack of progress.  ?????          Beaulah Dinning, PT, DPT 10/14/18 6:07 PM    [facility] [address] [place], [place], [address] Phone: [phone]   Fax:  [phone]  Name: Tracy [name] MRN: [id number] Date of Birth: Oct 21, 1965

## 2018-10-15 ENCOUNTER — Encounter: Payer: Self-pay | Admitting: Physical Therapy

## 2018-10-15 DIAGNOSIS — F3181 Bipolar II disorder: Secondary | ICD-10-CM | POA: Diagnosis not present

## 2018-10-15 MED ORDER — MELOXICAM 15 MG PO TABS: 15.0000 mg | ORAL_TABLET | Freq: Every day | ORAL | 0 refills | Status: DC

## 2018-10-16 ENCOUNTER — Encounter: Payer: Self-pay | Admitting: Family Medicine

## 2018-10-16 ENCOUNTER — Encounter (INDEPENDENT_AMBULATORY_CARE_PROVIDER_SITE_OTHER): Payer: Self-pay

## 2018-10-28 ENCOUNTER — Other Ambulatory Visit (INDEPENDENT_AMBULATORY_CARE_PROVIDER_SITE_OTHER): Payer: Self-pay | Admitting: *Deleted

## 2018-10-28 DIAGNOSIS — R1013 Epigastric pain: Secondary | ICD-10-CM

## 2018-10-28 DIAGNOSIS — R112 Nausea with vomiting, unspecified: Secondary | ICD-10-CM

## 2018-10-28 MED ORDER — RABEPRAZOLE SODIUM 20 MG PO TBEC: DELAYED_RELEASE_TABLET | ORAL | 1 refills | Status: DC

## 2018-10-29 DIAGNOSIS — F3181 Bipolar II disorder: Secondary | ICD-10-CM | POA: Diagnosis not present

## 2018-11-04 ENCOUNTER — Other Ambulatory Visit: Payer: Self-pay | Admitting: *Deleted

## 2018-11-04 MED ORDER — LISINOPRIL-HYDROCHLOROTHIAZIDE 10-12.5 MG PO TABS: 1.0000 | ORAL_TABLET | Freq: Every morning | ORAL | 0 refills | Status: DC

## 2018-11-09 ENCOUNTER — Encounter (INDEPENDENT_AMBULATORY_CARE_PROVIDER_SITE_OTHER): Payer: Self-pay

## 2018-11-10 ENCOUNTER — Other Ambulatory Visit (INDEPENDENT_AMBULATORY_CARE_PROVIDER_SITE_OTHER): Payer: Self-pay | Admitting: *Deleted

## 2018-11-10 ENCOUNTER — Encounter: Payer: Self-pay | Admitting: Family

## 2018-11-11 ENCOUNTER — Other Ambulatory Visit: Payer: Self-pay

## 2018-11-24 DIAGNOSIS — F3181 Bipolar II disorder: Secondary | ICD-10-CM | POA: Diagnosis not present

## 2018-12-02 ENCOUNTER — Encounter: Payer: Self-pay | Admitting: Family Medicine

## 2018-12-02 MED ORDER — MELOXICAM 15 MG PO TABS: 15.0000 mg | ORAL_TABLET | Freq: Every day | ORAL | 0 refills | Status: DC

## 2018-12-08 DIAGNOSIS — F3181 Bipolar II disorder: Secondary | ICD-10-CM | POA: Diagnosis not present

## 2018-12-21 DIAGNOSIS — F3181 Bipolar II disorder: Secondary | ICD-10-CM | POA: Diagnosis not present

## 2019-01-06 ENCOUNTER — Other Ambulatory Visit: Payer: Self-pay | Admitting: Family Medicine

## 2019-01-08 DIAGNOSIS — F3181 Bipolar II disorder: Secondary | ICD-10-CM | POA: Diagnosis not present

## 2019-01-14 ENCOUNTER — Ambulatory Visit (INDEPENDENT_AMBULATORY_CARE_PROVIDER_SITE_OTHER): Payer: 59 | Admitting: "Endocrinology

## 2019-01-20 DIAGNOSIS — F3181 Bipolar II disorder: Secondary | ICD-10-CM | POA: Diagnosis not present

## 2019-01-30 ENCOUNTER — Other Ambulatory Visit: Payer: Self-pay | Admitting: Family Medicine

## 2019-02-01 ENCOUNTER — Other Ambulatory Visit: Payer: Self-pay

## 2019-02-01 DIAGNOSIS — M1612 Unilateral primary osteoarthritis, left hip: Secondary | ICD-10-CM

## 2019-02-02 ENCOUNTER — Other Ambulatory Visit: Payer: Self-pay | Admitting: Family Medicine

## 2019-02-10 ENCOUNTER — Ambulatory Visit (INDEPENDENT_AMBULATORY_CARE_PROVIDER_SITE_OTHER): Payer: 59 | Admitting: Orthopaedic Surgery

## 2019-02-10 ENCOUNTER — Encounter: Payer: Self-pay | Admitting: Orthopaedic Surgery

## 2019-02-10 ENCOUNTER — Other Ambulatory Visit: Payer: Self-pay | Admitting: Internal Medicine

## 2019-02-10 ENCOUNTER — Other Ambulatory Visit: Payer: Self-pay

## 2019-02-10 ENCOUNTER — Ambulatory Visit (INDEPENDENT_AMBULATORY_CARE_PROVIDER_SITE_OTHER): Payer: 59

## 2019-02-10 VITALS — Ht 71.0 in | Wt 260.0 lb

## 2019-02-10 DIAGNOSIS — M1612 Unilateral primary osteoarthritis, left hip: Secondary | ICD-10-CM | POA: Diagnosis not present

## 2019-02-10 DIAGNOSIS — M25552 Pain in left hip: Secondary | ICD-10-CM | POA: Diagnosis not present

## 2019-02-10 HISTORY — DX: Unilateral primary osteoarthritis, left hip: M16.12

## 2019-02-10 NOTE — Progress Notes (Signed)
Office Visit Note   Patient: Tracy [name]           Date of Birth: 1966-02-13           MRN: [id number] Visit Date: 02/10/2019              Requested by: Lyndal Pulley, DO [place],  [place] [id number] PCP: Lance Sell, NP   Assessment & Plan: Visit Diagnoses:  1. Pain in left hip   2. Unilateral primary osteoarthritis, left hip     Plan: Given the failure of conservative treatment over multiple years now combined with the severity of her left hip pain in the osteoarthritis that is seen on x-rays, I do feel it is warranted to proceed with a total hip arthroplasty.  Her clinical exam supports this as well.  We had a long and thorough discussion about the surgery.  We talked about the risk and benefits of surgery.  We talked about non-surgical options.  We did discuss her interoperative and postoperative course and what is involved with the surgery even discussing her postoperative recovery.  All questions concerns were answered and addressed.  We will work on getting this scheduled.  Follow-Up Instructions: Return for 2 weeks post-op.   Orders:  Orders Placed This Encounter  Procedures  . XR HIP UNILAT W OR W/O PELVIS 1V LEFT   No orders of the defined types were placed in this encounter.     Procedures: No procedures performed   Clinical Data: No additional findings.   Subjective: Chief Complaint  Patient presents with  . Left Hip - Pain  Patient is a very pleasant 53 year old female who comes in for evaluation treatment of years of left hip pain and known end-stage arthritis.  She has tried activity modification, steroid injections, anti-inflammatories, walking with assistive ice and significant weight loss.  She has lost 75 pounds.  Her BMI is only 36.  Her hip pain on the left side is daily.  It is 10 out of 10.  Her left hip pain is definitely affecting her mobility, her quality of life and her actives of daily living.  She  is failed conservative treatment for multiple years now.  At this point she wishes to be considered for a total hip replacement.  She is seen other orthopedic doctors in the past as well.  She is not a diabetic anymore having lost significant amount of weight.  Hemoglobin A1c is below 6.  HPI  Review of Systems She currently denies any headache, chest pain, shortness of breath, fever, chills, nausea, vomiting  Objective: Vital Signs: Ht 5\' 11"  (1.803 m)   Wt 260 lb (117.9 kg)   BMI 36.26 kg/m   Physical Exam She is alert and orient x3 and in no acute distress Ortho Exam Examination of her right hip is normal examination of her left hip shows severe pain with attempts of internal and external rotation.  There is significant stiffness in her left hip as well.  I did have her lay in a supine position and get easily mobilized her soft tissue in terms of her abdomen and pannus in order to get to her hip.  Her thigh is not big. Specialty Comments:  No specialty comments available.  Imaging: Xr Hip Unilat W Or W/o Pelvis 1v Left  Result Date: 02/10/2019 An AP pelvis and a lateral of the left hip shows severe joint space narrowing of the superior lateral aspect of  left hip.  There is flattening of the femoral head and signs of previous femoral acetabular impingement.  There is some cystic changes in the femoral head and osteophytes around the joint.  Her right hip appears normal.    PMFS History: Patient Active Problem List   Diagnosis Date Noted  . Unilateral primary osteoarthritis, left hip 02/10/2019  . Greater trochanteric bursitis of left hip 07/24/2018  . Arthritis of left hip 06/25/2018  . Encounter for general adult medical examination with abnormal findings 01/04/2018  . Vitamin D deficiency 01/04/2018  . Lower back pain 07/18/2017  . Hemorrhoids, internal 07/18/2017  . Trigeminal neuralgia 01/18/2016  . Bipolar disorder ([place]) 11/28/2015  . Borderline personality disorder ([place])  11/28/2015  . Carpal tunnel syndrome 09/06/2015  . MDD (major depressive disorder) 06/06/2015  . Severe episode of recurrent major depressive disorder, without psychotic features ([place])   . History of posttraumatic stress disorder (PTSD)   . GAD (generalized anxiety disorder)   . Insulin resistance 02/09/2015  . 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 Acanthosis nigricans, acquired 02/09/2015  . Nonalcoholic fatty liver disease 02/09/2015  . Hepatic cirrhosis ([place]) 02/09/2015  . Female hirsutism 02/09/2015  . Oligomenorrhea 02/09/2015  . Infertility associated with anovulation 02/09/2015  . Morbid obesity ([place]) 10/31/2014  . Anxiety and depression 10/31/2014  . Obstructive sleep apnea 06/27/2014  . Diabetes mellitus, type 2 ([place]) 06/10/2013  . 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 Cystic teratoma   . PCOS (polycystic ovarian syndrome)   . Infertility, female   . TOBACCO USE, QUIT 02/08/2010  . HYPOTHYROIDISM, POSTSURGICAL 10/02/2007  . POLYCYSTIC OVARIAN DISEASE 10/02/2007  . Elevated cholesterol 10/02/2007  . DEPRESSION 10/02/2007  . Migraine 10/02/2007  . Essential hypertension 10/02/2007  . RENAL CALCULUS, HX OF 10/02/2007   Past Medical History:  Diagnosis Date  . Anxiety   . Arthritis    back, knees, right elbow  . Bipolar disorder ([place])   . Cirrhosis ([place])   . Dental crowns present   . Depression   . Diabetes mellitus without complication ([place])   . GERD (gastroesophageal reflux disease)   . High cholesterol    no current med.  Marland Kitchen History of kidney stones   . History of migraine headaches   . Hypertension    under control with meds., has been on med. x 4 yr.  . Hypothyroidism   . NASH (nonalcoholic steatohepatitis)   . PCOS (polycystic ovarian syndrome)   . PONV (postoperative nausea and vomiting)    also hx. of emergence delirium 2007  . Sleep apnea    uses CPAP nightly  . Trigeminal neuralgia     Family History  Problem Relation Age of Onset  . Heart disease  Father   . Depression Father   . Cancer Brother        THROAT  . Depression Brother   . Anxiety disorder Brother   . Depression Sister   . Breast cancer Mother   . Hypertension Mother     Past Surgical History:  Procedure Laterality Date  . ACHILLES TENDON SURGERY Left 2007  . APPENDECTOMY    . CARPAL TUNNEL RELEASE Right 01/12/2016   Procedure: RIGHT CARPAL TUNNEL RELEASE;  Surgeon: Roseanne Kaufman, MD;  Location: [facility];  Service: Orthopedics;  Laterality: Right;  . COLONOSCOPY WITH PROPOFOL N/A 04/20/2014   Procedure: COLONOSCOPY WITH PROPOFOL;  Surgeon: Arta Silence, MD;  [facility];  Service: Endoscopy;  Laterality: N/A;  . ESOPHAGOGASTRODUODENOSCOPY (EGD) WITH PROPOFOL N/A 04/20/2014  Procedure: ESOPHAGOGASTRODUODENOSCOPY (EGD) WITH PROPOFOL;  Surgeon: Arta Silence, MD;  [facility];  Service: Endoscopy;  Laterality: N/A;  . EXCISION HAGLUND'S DEFORMITY WITH ACHILLES TENDON REPAIR Right 02/25/2013   Procedure: RIGHT ACHILLES DEBRIDEMENT AND RECONSTRUCTION;  HAGLUND'S EXCISION; GASTROC RECESSION AND FLEXOR HALLUCIS LONGUS TRANSFER;  Surgeon: Wylene Simmer, MD;  Location: [facility];  Service: Orthopedics;  Laterality: Right;  . LUMBAR LAMINECTOMY     X 3  . neck fusion    . OVARIAN CYST SURGERY Left 1998  . PILONIDAL CYST EXCISION  1994  . RIGHT OOPHORECTOMY    . STERIOD INJECTION Left 01/12/2016   Procedure: STEROID INJECTION LEFT WRIST;  Surgeon: Roseanne Kaufman, MD;  Location: [facility];  Service: Orthopedics;  Laterality: Left;  . TOTAL THYROIDECTOMY  2003  . UPPER GI ENDOSCOPY  01/19/2015   Social History   Occupational History  . Occupation: RN- PICU  Tobacco Use  . Smoking status: Current Every Day Smoker    Packs/day: 0.50    Years: 15.00    Pack years: 7.50    Types: Cigarettes  . Smokeless tobacco: Never Used  Substance and Sexual Activity  . Alcohol use: No    Alcohol/week: 0.0 standard  drinks    Comment: Has not had any alcohol since 07/2014 - before this date, she rarely drink.  . Drug use: No  . Sexual activity: Never    Partners: Male

## 2019-02-11 ENCOUNTER — Encounter: Payer: Self-pay | Admitting: Orthopaedic Surgery

## 2019-02-12 ENCOUNTER — Encounter: Payer: Self-pay | Admitting: Orthopaedic Surgery

## 2019-02-17 DIAGNOSIS — F3181 Bipolar II disorder: Secondary | ICD-10-CM | POA: Diagnosis not present

## 2019-02-18 ENCOUNTER — Other Ambulatory Visit: Payer: Self-pay

## 2019-02-25 ENCOUNTER — Other Ambulatory Visit: Payer: Self-pay | Admitting: Family Medicine

## 2019-03-03 DIAGNOSIS — F3181 Bipolar II disorder: Secondary | ICD-10-CM | POA: Diagnosis not present

## 2019-03-04 ENCOUNTER — Encounter (INDEPENDENT_AMBULATORY_CARE_PROVIDER_SITE_OTHER): Payer: Self-pay | Admitting: "Endocrinology

## 2019-03-04 ENCOUNTER — Ambulatory Visit (INDEPENDENT_AMBULATORY_CARE_PROVIDER_SITE_OTHER): Payer: 59 | Admitting: "Endocrinology

## 2019-03-04 ENCOUNTER — Other Ambulatory Visit: Payer: Self-pay

## 2019-03-04 VITALS — BP 124/70 | HR 74 | Wt 263.8 lb

## 2019-03-04 DIAGNOSIS — R1013 Epigastric pain: Secondary | ICD-10-CM | POA: Diagnosis not present

## 2019-03-04 DIAGNOSIS — I10 Essential (primary) hypertension: Secondary | ICD-10-CM

## 2019-03-04 DIAGNOSIS — N914 Secondary oligomenorrhea: Secondary | ICD-10-CM | POA: Diagnosis not present

## 2019-03-04 DIAGNOSIS — E890 Postprocedural hypothyroidism: Secondary | ICD-10-CM | POA: Diagnosis not present

## 2019-03-04 DIAGNOSIS — L83 Acanthosis nigricans: Secondary | ICD-10-CM

## 2019-03-04 DIAGNOSIS — E119 Type 2 diabetes mellitus without complications: Secondary | ICD-10-CM

## 2019-03-04 DIAGNOSIS — F419 Anxiety disorder, unspecified: Secondary | ICD-10-CM

## 2019-03-04 DIAGNOSIS — F329 Major depressive disorder, single episode, unspecified: Secondary | ICD-10-CM | POA: Diagnosis not present

## 2019-03-04 LAB — POCT GLYCOSYLATED HEMOGLOBIN (HGB A1C): Hemoglobin A1C: 5.1 % (ref 4.0–5.6)

## 2019-03-04 LAB — POCT GLUCOSE (DEVICE FOR HOME USE): POC Glucose: 89 mg/dl (ref 70–99)

## 2019-03-04 NOTE — Progress Notes (Signed)
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 comes and goes. Her periods had become irregular again, which could have been due to worsening PCOS, but could also be due to her age and perimenopausal status. Her serum testosterone was elevated in September 2019, but at the lower end of the range that we usually see in PCOS. Her androstenedione and DHEAS were well within the normal ranges.   B. 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 This degree of oligomenorrhea and infertility is common in women with severe PCOS/SLS.   C. What I don't understand, however, is why she suddenly began having regular menses in January 2016, during the same time period that her BGs suddenly came under control. While I doubt that starting C-pap treatment produced these dramatic improvements, I don't have another explanation. I wonder if she had been hypercortisolemia in the past. If she had had severe, chronic Cushing's disease, we should have seen severe hyperpigmentation, which has not been present. If she had had severe, chronic Cushing's syndrome due to autonomous adrenal nodules, I would not expect a sudden reversal.  If she had had a cortisol-producing mass lesion, it should have shown up on CT of her abdomen, but did not. If she had had a severe prolactinoma as the cause of her oligomenorrhea, her prolactin should have been elevated and the tumor should have been a macroprolactinoma and should have been visible on prior images of the brain. A macroprolactinoma, however, would not usually have contributed to T2DM.   D. When she lost weight, her oligomenorrhea improved. She had been having menses almost regularly for the past year prior to her last visit. However, when she regained weight, the oligomenorrhea worsened and she skipped periods in May, in July, and in November 2018. Then when she lost more weight last year, her periods resumed.   E. Her last period was in January 2020. We will see if she is now in menopause. 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 USE PROTECTION.  14. Fatigue: She has much less fatigue now.  15. Edema: This problem is not evident today.   16. Tachycardia: She is not tachycardic today. 17. Tinea pedis: Her tinea pedis is worse today. She has not been using ketoconazole.   PLAN:  1. Diagnostic: Check TFTs, LH, FSH, estradiol, and testosterone now. 2. Therapeutic: Resume ketoconazole 2% cream, twice daily on her feet as needed. Continue Synthroid doses of 300 mcg/day on 1 day each week and 200 mcg/day on 6 days each week. Continue to hold Lantus. Continue rabeprazole, 20 mg, once daily as tolerated.  Eat Right Diet. After her hip surgery, resume daily exercise as tolerated, with a goal of gradually increasing her duration of exercise to at least one hour per day. Consider adding spironolactone if really needed. 3. Patient education: See Ms Matilde Sprang and Ms  Poulos regularly. We discussed all of the above at great length. We discussed her post-surgical hypothyroidism, morbid obesity, hirsutism, T2DM,  hypertension, and dyspepsia, to include the need for her to take her medications for these problems consistently and reliably. 4. Follow-up: 3 months  Level of Service: This visit lasted in excess of 65 minutes. More than 50% of the visit was devoted to counseling.  Tillman Sers, MD, CDE Adult and Pediatric Endocrinology

## 2019-03-04 NOTE — Patient Instructions (Signed)
Follow up visit in 3 months. 

## 2019-03-06 ENCOUNTER — Other Ambulatory Visit: Payer: Self-pay | Admitting: Internal Medicine

## 2019-03-08 ENCOUNTER — Encounter: Payer: Self-pay | Admitting: Orthopaedic Surgery

## 2019-03-08 LAB — T4, FREE: Free T4: 1 ng/dL (ref 0.8–1.8)

## 2019-03-08 LAB — T3, FREE: T3, Free: 2.2 pg/mL — ABNORMAL LOW (ref 2.3–4.2)

## 2019-03-08 LAB — LUTEINIZING HORMONE: LH: 16.5 m[IU]/mL

## 2019-03-08 LAB — TSH: TSH: 6.97 mIU/L — ABNORMAL HIGH

## 2019-03-08 LAB — FOLLICLE STIMULATING HORMONE: FSH: 20.5 m[IU]/mL

## 2019-03-08 LAB — ESTRADIOL, ULTRA SENS: Estradiol, Ultra Sensitive: 14 pg/mL

## 2019-03-10 ENCOUNTER — Other Ambulatory Visit: Payer: Self-pay | Admitting: Physician Assistant

## 2019-03-11 ENCOUNTER — Other Ambulatory Visit: Payer: Self-pay | Admitting: Physician Assistant

## 2019-03-13 ENCOUNTER — Encounter (INDEPENDENT_AMBULATORY_CARE_PROVIDER_SITE_OTHER): Payer: Self-pay

## 2019-03-15 NOTE — Patient Instructions (Addendum)
YOU NEED TO HAVE A COVID 19 TEST ON  03-16-19  @9 :00 AM, THIS TEST MUST BE DONE BEFORE SURGERY, COME  [address] , [address]. ONCE YOUR COVID TEST IS COMPLETED, PLEASE BEGIN THE QUARANTINE INSTRUCTIONS AS OUTLINED IN YOUR HANDOUT.                Tracy [name]  03/15/2019   Your procedure is scheduled on: 03-19-19    Report to [facility] Main  Entrance    Report to Admitting at 9:45 AM   1 VISITOR IS ALLOWED TO WAIT IN WAITING ROOM  ONLY DAY OF YOUR SURGERY.    Call this number if you have problems the morning of surgery [phone]    Remember: NO SOLID FOOD AFTER MIDNIGHT. NOTHING BY MOUTH EXCEPT CLEAR LIQUIDS UNTIL 3 HOURS PRIOR TO [facility] SURGERY. PLEASE FINISH ENSURE DRINK AT 9:15 AM  CLEAR LIQUID DIET   Foods Allowed                                                                     Foods Excluded  Coffee and tea, regular and decaf                             liquids that you cannot  Plain Jell-O any favor except red or purple                                           see through such as: Fruit ices (not with fruit pulp)                                     milk, soups, orange juice  Iced Popsicles                                    All solid food Carbonated beverages, regular and diet                                    Cranberry, grape and apple juices Sports drinks like Gatorade Lightly seasoned clear broth or consume(fat free) Sugar, honey syrup   _____________________________________________________________________     Take these medicines the morning of surgery with A SIP OF WATER: Clonazepam, Prozac (Fluoxetine), Rapeprazole, Synthroid and Loratadine, prn. You may also use and bring your nasal spray.  BRUSH YOUR TEETH MORNING OF SURGERY AND RINSE YOUR MOUTH OUT, NO CHEWING GUM CANDY OR MINTS.                                You may not have any metal on your body including hair pins and              piercings     Do  not wear jewelry, makeup  lotions, powders or  deodorant              Do not wear nail polish.  Do not shave  48 hours prior to surgery.                Do not bring valuables to the hospital. [place].  Contacts, dentures or bridgework may not be worn into surgery.  Leave suitcase in the car. After surgery it may be brought to your room.     Special Instructions: Please bring your mask and tubing for your CPAP machine              Please read over the following fact sheets you were given: _____________________________________________________________________             [facility] - Preparing for Surgery Before surgery, you can play an important role.  Because skin is not sterile, your skin needs to be as free of germs as possible.  You can reduce the number of germs on your skin by washing with CHG (chlorahexidine gluconate) soap before surgery.  CHG is an antiseptic cleaner which kills germs and bonds with the skin to continue killing germs even after washing. Please DO NOT use if you have an allergy to CHG or antibacterial soaps.  If your skin becomes reddened/irritated stop using the CHG and inform your nurse when you arrive at Short Stay. Do not shave (including legs and underarms) for at least 48 hours prior to the first CHG shower.  You may shave your face/neck. Please follow these instructions carefully:  1.  Shower with CHG Soap the night before surgery and the  morning of Surgery.  2.  If you choose to wash your hair, wash your hair first as usual with your  normal  shampoo.  3.  After you shampoo, rinse your hair and body thoroughly to remove the  shampoo.                           4.  Use CHG as you would any other liquid soap.  You can apply chg directly  to the skin and wash                       Gently with a scrungie or clean washcloth.  5.  Apply the CHG Soap to your body ONLY FROM THE NECK DOWN.   Do not use on face/ open                            Wound or open sores. Avoid contact with eyes, ears mouth and genitals (private parts).                       Wash face,  Genitals (private parts) with your normal soap.             6.  Wash thoroughly, paying special attention to the area where your surgery  will be performed.  7.  Thoroughly rinse your body with warm water from the neck down.  8.  DO NOT shower/wash with your normal soap after using and rinsing off  the CHG Soap.                9.  Pat yourself dry with a clean  towel.            10.  Wear clean pajamas.            11.  Place clean sheets on your bed the night of your first shower and do not  sleep with pets. Day of Surgery : Do not apply any lotions/deodorants the morning of surgery.  Please wear clean clothes to the hospital/surgery center.  FAILURE TO FOLLOW THESE INSTRUCTIONS MAY RESULT IN THE CANCELLATION OF YOUR SURGERY PATIENT SIGNATURE_________________________________  NURSE SIGNATURE__________________________________  ________________________________________________________________________   Tracy [name]  An incentive spirometer is a tool that can help keep your lungs clear and active. This tool measures how well you are filling your lungs with each breath. Taking long deep breaths may help reverse or decrease the chance of developing breathing (pulmonary) problems (especially infection) following:  A long period of time when you are unable to move or be active. BEFORE THE PROCEDURE   If the spirometer includes an indicator to show your best effort, your nurse or respiratory therapist will set it to a desired goal.  If possible, sit up straight or lean slightly forward. Try not to slouch.  Hold the incentive spirometer in an upright position. INSTRUCTIONS FOR USE  1. Sit on the edge of your bed if possible, or sit up as far as you can in bed or on a chair. 2. Hold the incentive spirometer in an upright position. 3. Breathe  out normally. 4. Place the mouthpiece in your mouth and seal your lips tightly around it. 5. Breathe in slowly and as deeply as possible, raising the piston or the ball toward the top of the column. 6. Hold your breath for 3-5 seconds or for as long as possible. Allow the piston or ball to fall to the bottom of the column. 7. Remove the mouthpiece from your mouth and breathe out normally. 8. Rest for a few seconds and repeat Steps 1 through 7 at least 10 times every 1-2 hours when you are awake. Take your time and take a few normal breaths between deep breaths. 9. The spirometer may include an indicator to show your best effort. Use the indicator as a goal to work toward during each repetition. 10. After each set of 10 deep breaths, practice coughing to be sure your lungs are clear. If you have an incision (the cut made at the time of surgery), support your incision when coughing by placing a pillow or rolled up towels firmly against it. Once you are able to get out of bed, walk around indoors and cough well. You may stop using the incentive spirometer when instructed by your caregiver.  RISKS AND COMPLICATIONS  Take your time so you do not get dizzy or light-headed.  If you are in pain, you may need to take or ask for pain medication before doing incentive spirometry. It is harder to take a deep breath if you are having pain. AFTER USE  Rest and breathe slowly and easily.  It can be helpful to keep track of a log of your progress. Your caregiver can provide you with a simple table to help with this. If you are using the spirometer at home, follow these instructions: [place] IF:   You are having difficultly using the spirometer.  You have trouble using the spirometer as often as instructed.  Your pain medication is not giving enough relief while using the spirometer.  You develop fever of 100.5 F (38.1 C) or higher.  SEEK IMMEDIATE MEDICAL CARE IF:   You cough up bloody  sputum that had not been present before.  You develop fever of 102 F (38.9 C) or greater.  You develop worsening pain at or near the incision site. MAKE SURE YOU:   Understand these instructions.  Will watch your condition.  Will get help right away if you are not doing well or get worse. Document Released: 12/02/2006 Document Revised: 10/14/2011 Document Reviewed: 02/02/2007 [facility] Patient Information 2014 [place], [place].   ________________________________________________________________________

## 2019-03-16 ENCOUNTER — Encounter (HOSPITAL_COMMUNITY)
Admission: RE | Admit: 2019-03-16 | Discharge: 2019-03-16 | Disposition: A | Discharge status: Home / Self Care | Payer: 59 | Source: Ambulatory Visit | Attending: Orthopaedic Surgery | Admitting: Orthopaedic Surgery

## 2019-03-16 ENCOUNTER — Encounter (HOSPITAL_COMMUNITY): Payer: Self-pay

## 2019-03-16 ENCOUNTER — Other Ambulatory Visit: Payer: Self-pay

## 2019-03-16 DIAGNOSIS — M1612 Unilateral primary osteoarthritis, left hip: Secondary | ICD-10-CM | POA: Diagnosis not present

## 2019-03-16 DIAGNOSIS — F603 Borderline personality disorder: Secondary | ICD-10-CM | POA: Diagnosis not present

## 2019-03-16 DIAGNOSIS — E282 Polycystic ovarian syndrome: Secondary | ICD-10-CM | POA: Diagnosis not present

## 2019-03-16 DIAGNOSIS — E119 Type 2 diabetes mellitus without complications: Secondary | ICD-10-CM | POA: Diagnosis not present

## 2019-03-16 DIAGNOSIS — F319 Bipolar disorder, unspecified: Secondary | ICD-10-CM | POA: Diagnosis not present

## 2019-03-16 DIAGNOSIS — F4310 Post-traumatic stress disorder, unspecified: Secondary | ICD-10-CM | POA: Diagnosis not present

## 2019-03-16 DIAGNOSIS — E559 Vitamin D deficiency, unspecified: Secondary | ICD-10-CM | POA: Diagnosis not present

## 2019-03-16 DIAGNOSIS — Z20828 Contact with and (suspected) exposure to other viral communicable diseases: Secondary | ICD-10-CM | POA: Diagnosis not present

## 2019-03-16 DIAGNOSIS — F411 Generalized anxiety disorder: Secondary | ICD-10-CM | POA: Diagnosis not present

## 2019-03-16 LAB — BASIC METABOLIC PANEL
Anion gap: 8 (ref 5–15)
BUN: 18 mg/dL (ref 6–20)
CO2: 27 mmol/L (ref 22–32)
Calcium: 9.2 mg/dL (ref 8.9–10.3)
Chloride: 103 mmol/L (ref 98–111)
Creatinine, Ser: 0.92 mg/dL (ref 0.44–1.00)
GFR calc Af Amer: >60 mL/min (ref >60)
GFR calc non Af Amer: >60 mL/min (ref >60)
Glucose, Bld: 72 mg/dL (ref 70–99)
Potassium: 3.7 mmol/L (ref 3.5–5.1)
Sodium: 138 mmol/L (ref 135–145)

## 2019-03-16 LAB — CBC
HCT: 44.2 % (ref 36.0–46.0)
Hemoglobin: 14 g/dL (ref 12.0–15.0)
MCH: 27.7 pg (ref 26.0–34.0)
MCHC: 31.7 g/dL (ref 30.0–36.0)
MCV: 87.5 fL (ref 80.0–100.0)
Platelets: 208 10*3/uL (ref 150–400)
RBC: 5.05 MIL/uL (ref 3.87–5.11)
RDW: 15.4 % (ref 11.5–15.5)
WBC: 5.1 10*3/uL (ref 4.0–10.5)
nRBC: 0 % (ref 0.0–0.2)

## 2019-03-16 LAB — SURGICAL PCR SCREEN
MRSA, PCR: NEGATIVE
Staphylococcus aureus: NEGATIVE

## 2019-03-16 LAB — HEMOGLOBIN A1C
Hgb A1c MFr Bld: 4.9 % (ref 4.8–5.6)
Mean Plasma Glucose: 93.93 mg/dL

## 2019-03-16 NOTE — Progress Notes (Signed)
03-04-19 (Epic) HGA1C

## 2019-03-16 NOTE — Progress Notes (Signed)
Pt was unaware that she needed to have her COVID-19 test done on 03-16-19. Pt is scheduled for surgery on 03-19-19. Per Audria Nine,  okay for patient to have her COVID test on 03-17-19.

## 2019-03-17 ENCOUNTER — Encounter (INDEPENDENT_AMBULATORY_CARE_PROVIDER_SITE_OTHER): Payer: Self-pay

## 2019-03-17 ENCOUNTER — Other Ambulatory Visit (HOSPITAL_COMMUNITY)
Admission: RE | Admit: 2019-03-17 | Discharge: 2019-03-17 | Disposition: A | Discharge status: Home / Self Care | Payer: 59 | Source: Ambulatory Visit | Attending: Orthopaedic Surgery | Admitting: Orthopaedic Surgery

## 2019-03-17 LAB — SARS CORONAVIRUS 2 (TAT 6-24 HRS): SARS Coronavirus 2: NEGATIVE

## 2019-03-18 ENCOUNTER — Other Ambulatory Visit: Payer: Self-pay | Admitting: *Deleted

## 2019-03-18 NOTE — Patient Outreach (Addendum)
[facility]) Care Management  03/18/2019  Elania Crowl [birth year] [id number]  Preoperative Screening Call Referral received: 03/17/19 Surgery/Procedure date: 03/19/19 Initial outreach: 03/18/19 Insurance: [place]  Initial unsuccessful telephone call to patient's preferred (mobile) number in order to complete pre-operative screening; no answer, left HIPAA compliant voicemail message requesting return call.   Objective: Per the electronic medical record, Delta Pichon  is scheduled for left total hip arthroplasty on 03/19/19 at [facility]. She completed her pre-operative admission testing visit on 03/16/19.  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  Barrington Ellison RN,CCM,CDE [place] Management Coordinator Office Phone [phone] Office Fax [phone]

## 2019-03-19 ENCOUNTER — Inpatient Hospital Stay (HOSPITAL_COMMUNITY): Payer: 59

## 2019-03-19 ENCOUNTER — Encounter (HOSPITAL_COMMUNITY): Payer: Self-pay

## 2019-03-19 ENCOUNTER — Other Ambulatory Visit: Payer: Self-pay

## 2019-03-19 ENCOUNTER — Inpatient Hospital Stay (HOSPITAL_COMMUNITY): Payer: 59 | Admitting: Physician Assistant

## 2019-03-19 ENCOUNTER — Encounter (HOSPITAL_COMMUNITY)
Admission: RE | Disposition: A | Discharge status: Home / Self Care | Payer: Self-pay | Source: Home / Self Care | Attending: Orthopaedic Surgery

## 2019-03-19 ENCOUNTER — Inpatient Hospital Stay (HOSPITAL_COMMUNITY): Payer: 59 | Admitting: Anesthesiology

## 2019-03-19 ENCOUNTER — Inpatient Hospital Stay (HOSPITAL_COMMUNITY)
Admission: RE | Admit: 2019-03-19 | Discharge: 2019-03-22 | DRG: 470 | Disposition: A | Discharge status: Home Health | Payer: 59 | Attending: Orthopaedic Surgery | Admitting: Orthopaedic Surgery

## 2019-03-19 DIAGNOSIS — Z882 Allergy status to sulfonamides status: Secondary | ICD-10-CM | POA: Diagnosis not present

## 2019-03-19 DIAGNOSIS — Z79890 Hormone replacement therapy (postmenopausal): Secondary | ICD-10-CM | POA: Diagnosis not present

## 2019-03-19 DIAGNOSIS — K219 Gastro-esophageal reflux disease without esophagitis: Secondary | ICD-10-CM | POA: Diagnosis present

## 2019-03-19 DIAGNOSIS — Z96642 Presence of left artificial hip joint: Secondary | ICD-10-CM

## 2019-03-19 DIAGNOSIS — I10 Essential (primary) hypertension: Secondary | ICD-10-CM | POA: Diagnosis present

## 2019-03-19 DIAGNOSIS — F603 Borderline personality disorder: Secondary | ICD-10-CM | POA: Diagnosis present

## 2019-03-19 DIAGNOSIS — Z885 Allergy status to narcotic agent status: Secondary | ICD-10-CM | POA: Diagnosis not present

## 2019-03-19 DIAGNOSIS — K7581 Nonalcoholic steatohepatitis (NASH): Secondary | ICD-10-CM | POA: Diagnosis present

## 2019-03-19 DIAGNOSIS — E890 Postprocedural hypothyroidism: Secondary | ICD-10-CM | POA: Diagnosis not present

## 2019-03-19 DIAGNOSIS — Z800 Family history of malignant neoplasm of digestive organs: Secondary | ICD-10-CM

## 2019-03-19 DIAGNOSIS — G4730 Sleep apnea, unspecified: Secondary | ICD-10-CM | POA: Diagnosis present

## 2019-03-19 DIAGNOSIS — F411 Generalized anxiety disorder: Secondary | ICD-10-CM | POA: Diagnosis present

## 2019-03-19 DIAGNOSIS — Z471 Aftercare following joint replacement surgery: Secondary | ICD-10-CM | POA: Diagnosis not present

## 2019-03-19 DIAGNOSIS — E559 Vitamin D deficiency, unspecified: Secondary | ICD-10-CM | POA: Diagnosis present

## 2019-03-19 DIAGNOSIS — F4310 Post-traumatic stress disorder, unspecified: Secondary | ICD-10-CM | POA: Diagnosis present

## 2019-03-19 DIAGNOSIS — Z87891 Personal history of nicotine dependence: Secondary | ICD-10-CM | POA: Diagnosis not present

## 2019-03-19 DIAGNOSIS — E039 Hypothyroidism, unspecified: Secondary | ICD-10-CM | POA: Diagnosis present

## 2019-03-19 DIAGNOSIS — E7800 Pure hypercholesterolemia, unspecified: Secondary | ICD-10-CM | POA: Diagnosis present

## 2019-03-19 DIAGNOSIS — E119 Type 2 diabetes mellitus without complications: Secondary | ICD-10-CM | POA: Diagnosis not present

## 2019-03-19 DIAGNOSIS — Z888 Allergy status to other drugs, medicaments and biological substances status: Secondary | ICD-10-CM | POA: Diagnosis not present

## 2019-03-19 DIAGNOSIS — Z803 Family history of malignant neoplasm of breast: Secondary | ICD-10-CM

## 2019-03-19 DIAGNOSIS — E282 Polycystic ovarian syndrome: Secondary | ICD-10-CM | POA: Diagnosis present

## 2019-03-19 DIAGNOSIS — Z6836 Body mass index (BMI) 36.0-36.9, adult: Secondary | ICD-10-CM

## 2019-03-19 DIAGNOSIS — Z419 Encounter for procedure for purposes other than remedying health state, unspecified: Secondary | ICD-10-CM

## 2019-03-19 DIAGNOSIS — Z7951 Long term (current) use of inhaled steroids: Secondary | ICD-10-CM | POA: Diagnosis not present

## 2019-03-19 DIAGNOSIS — Z818 Family history of other mental and behavioral disorders: Secondary | ICD-10-CM

## 2019-03-19 DIAGNOSIS — Z8249 Family history of ischemic heart disease and other diseases of the circulatory system: Secondary | ICD-10-CM

## 2019-03-19 DIAGNOSIS — Z791 Long term (current) use of non-steroidal anti-inflammatories (NSAID): Secondary | ICD-10-CM | POA: Diagnosis not present

## 2019-03-19 DIAGNOSIS — Z79899 Other long term (current) drug therapy: Secondary | ICD-10-CM

## 2019-03-19 DIAGNOSIS — F319 Bipolar disorder, unspecified: Secondary | ICD-10-CM | POA: Diagnosis present

## 2019-03-19 DIAGNOSIS — Z20828 Contact with and (suspected) exposure to other viral communicable diseases: Secondary | ICD-10-CM | POA: Diagnosis present

## 2019-03-19 DIAGNOSIS — M1612 Unilateral primary osteoarthritis, left hip: Principal | ICD-10-CM | POA: Diagnosis present

## 2019-03-19 HISTORY — PX: TOTAL HIP ARTHROPLASTY: SHX124

## 2019-03-19 HISTORY — DX: Presence of left artificial hip joint: Z96.642

## 2019-03-19 LAB — GLUCOSE, CAPILLARY: Glucose-Capillary: 79 mg/dL (ref 70–99)

## 2019-03-19 LAB — PREGNANCY, URINE: Preg Test, Ur: NEGATIVE

## 2019-03-19 SURGERY — ARTHROPLASTY, HIP, TOTAL, ANTERIOR APPROACH
Anesthesia: Spinal | Site: Hip | Laterality: Left

## 2019-03-19 MED ORDER — MENTHOL 3 MG MT LOZG: 1.0000 | LOZENGE | OROMUCOSAL | Status: DC | PRN

## 2019-03-19 MED ORDER — MIDAZOLAM HCL 5 MG/5ML IJ SOLN
INTRAMUSCULAR | Status: DC | PRN
  Administered 2019-03-19: 2 mg via INTRAVENOUS

## 2019-03-19 MED ORDER — DIPHENHYDRAMINE HCL 12.5 MG/5ML PO ELIX: 12.5000 mg | ORAL_SOLUTION | ORAL | Status: DC | PRN

## 2019-03-19 MED ORDER — CHLORHEXIDINE GLUCONATE 4 % EX LIQD: 60.0000 mL | Freq: Once | CUTANEOUS | Status: DC

## 2019-03-19 MED ORDER — PROPOFOL 10 MG/ML IV BOLUS
INTRAVENOUS | Status: AC
  Filled 2019-03-19: qty 40

## 2019-03-19 MED ORDER — MIDAZOLAM HCL 2 MG/2ML IJ SOLN
INTRAMUSCULAR | Status: AC
  Filled 2019-03-19: qty 2

## 2019-03-19 MED ORDER — FLUOXETINE HCL 20 MG PO CAPS
40.0000 mg | ORAL_CAPSULE | Freq: Every day | ORAL | Status: DC
  Administered 2019-03-20 – 2019-03-22 (×3): 40 mg via ORAL
  Filled 2019-03-19 (×3): qty 2

## 2019-03-19 MED ORDER — HYDROCHLOROTHIAZIDE 12.5 MG PO CAPS
12.5000 mg | ORAL_CAPSULE | Freq: Every day | ORAL | Status: DC
  Administered 2019-03-20 – 2019-03-22 (×3): 12.5 mg via ORAL
  Filled 2019-03-19 (×3): qty 1

## 2019-03-19 MED ORDER — LACTATED RINGERS IV SOLN
INTRAVENOUS | Status: DC
  Administered 2019-03-19 (×3): via INTRAVENOUS

## 2019-03-19 MED ORDER — PROMETHAZINE HCL 25 MG/ML IJ SOLN: 6.2500 mg | INTRAMUSCULAR | Status: DC | PRN

## 2019-03-19 MED ORDER — CLONAZEPAM 1 MG PO TABS
1.0000 mg | ORAL_TABLET | Freq: Three times a day (TID) | ORAL | Status: DC
  Administered 2019-03-19 – 2019-03-22 (×8): 1 mg via ORAL
  Filled 2019-03-19 (×8): qty 1

## 2019-03-19 MED ORDER — CEFAZOLIN SODIUM-DEXTROSE 1-4 GM/50ML-% IV SOLN
1.0000 g | Freq: Four times a day (QID) | INTRAVENOUS | Status: AC
  Administered 2019-03-19 – 2019-03-20 (×2): 1 g via INTRAVENOUS
  Filled 2019-03-19 (×2): qty 50

## 2019-03-19 MED ORDER — ONDANSETRON HCL 4 MG PO TABS: 4.0000 mg | ORAL_TABLET | Freq: Four times a day (QID) | ORAL | Status: DC | PRN

## 2019-03-19 MED ORDER — LORATADINE 10 MG PO TABS: 10.0000 mg | ORAL_TABLET | Freq: Every day | ORAL | Status: DC | PRN

## 2019-03-19 MED ORDER — 0.9 % SODIUM CHLORIDE (POUR BTL) OPTIME
TOPICAL | Status: DC | PRN
  Administered 2019-03-19: 1000 mL

## 2019-03-19 MED ORDER — ONDANSETRON HCL 4 MG/2ML IJ SOLN
INTRAMUSCULAR | Status: DC | PRN
  Administered 2019-03-19: 4 mg via INTRAVENOUS

## 2019-03-19 MED ORDER — LEVOTHYROXINE SODIUM 100 MCG PO TABS
300.0000 ug | ORAL_TABLET | ORAL | Status: DC
  Administered 2019-03-22: 300 ug via ORAL
  Filled 2019-03-19: qty 3

## 2019-03-19 MED ORDER — FENTANYL CITRATE (PF) 100 MCG/2ML IJ SOLN
25.0000 ug | INTRAMUSCULAR | Status: DC | PRN
  Administered 2019-03-19: 50 ug via INTRAVENOUS
  Administered 2019-03-19 (×4): 25 ug via INTRAVENOUS

## 2019-03-19 MED ORDER — OXYCODONE HCL 5 MG PO TABS
5.0000 mg | ORAL_TABLET | ORAL | Status: DC | PRN
  Administered 2019-03-21 – 2019-03-22 (×3): 5 mg via ORAL
  Filled 2019-03-19: qty 1
  Filled 2019-03-19 (×2): qty 2
  Filled 2019-03-19: qty 1

## 2019-03-19 MED ORDER — ZOLPIDEM TARTRATE 5 MG PO TABS: 5.0000 mg | ORAL_TABLET | Freq: Every evening | ORAL | Status: DC | PRN

## 2019-03-19 MED ORDER — ASPIRIN 81 MG PO CHEW
81.0000 mg | CHEWABLE_TABLET | Freq: Two times a day (BID) | ORAL | Status: DC
  Administered 2019-03-19 – 2019-03-22 (×6): 81 mg via ORAL
  Filled 2019-03-19 (×6): qty 1

## 2019-03-19 MED ORDER — SODIUM CHLORIDE 0.9 % IR SOLN
Status: DC | PRN
  Administered 2019-03-19: 1000 mL

## 2019-03-19 MED ORDER — PHENOL 1.4 % MT LIQD: 1.0000 | OROMUCOSAL | Status: DC | PRN

## 2019-03-19 MED ORDER — METOCLOPRAMIDE HCL 5 MG PO TABS: 5.0000 mg | ORAL_TABLET | Freq: Three times a day (TID) | ORAL | Status: DC | PRN

## 2019-03-19 MED ORDER — ACETAMINOPHEN 325 MG PO TABS
325.0000 mg | ORAL_TABLET | Freq: Four times a day (QID) | ORAL | Status: DC | PRN
  Administered 2019-03-19 – 2019-03-21 (×4): 650 mg via ORAL
  Administered 2019-03-21 – 2019-03-22 (×2): 325 mg via ORAL
  Filled 2019-03-19: qty 1
  Filled 2019-03-19 (×3): qty 2
  Filled 2019-03-19: qty 1

## 2019-03-19 MED ORDER — METHOCARBAMOL 500 MG IVPB - SIMPLE MED
500.0000 mg | Freq: Four times a day (QID) | INTRAVENOUS | Status: DC | PRN
  Administered 2019-03-19: 500 mg via INTRAVENOUS
  Filled 2019-03-19: qty 50

## 2019-03-19 MED ORDER — METHOCARBAMOL 500 MG PO TABS
500.0000 mg | ORAL_TABLET | Freq: Four times a day (QID) | ORAL | Status: DC | PRN
  Administered 2019-03-19 – 2019-03-20 (×2): 500 mg via ORAL
  Filled 2019-03-19 (×2): qty 1

## 2019-03-19 MED ORDER — PANTOPRAZOLE SODIUM 40 MG PO TBEC
40.0000 mg | DELAYED_RELEASE_TABLET | Freq: Every day | ORAL | Status: DC
  Administered 2019-03-19 – 2019-03-22 (×4): 40 mg via ORAL
  Filled 2019-03-19 (×4): qty 1

## 2019-03-19 MED ORDER — FLUTICASONE PROPIONATE 50 MCG/ACT NA SUSP: 1.0000 | Freq: Every day | NASAL | Status: DC | PRN

## 2019-03-19 MED ORDER — DEXAMETHASONE SODIUM PHOSPHATE 10 MG/ML IJ SOLN
INTRAMUSCULAR | Status: AC
  Filled 2019-03-19: qty 1

## 2019-03-19 MED ORDER — DEXAMETHASONE SODIUM PHOSPHATE 10 MG/ML IJ SOLN
INTRAMUSCULAR | Status: DC | PRN
  Administered 2019-03-19: 10 mg via INTRAVENOUS

## 2019-03-19 MED ORDER — POLYETHYLENE GLYCOL 3350 17 G PO PACK: 17.0000 g | PACK | Freq: Every day | ORAL | Status: DC | PRN

## 2019-03-19 MED ORDER — SODIUM CHLORIDE 0.9 % IV SOLN
INTRAVENOUS | Status: DC
  Administered 2019-03-19: 18:00:00 via INTRAVENOUS

## 2019-03-19 MED ORDER — FENTANYL CITRATE (PF) 100 MCG/2ML IJ SOLN
INTRAMUSCULAR | Status: DC | PRN
  Administered 2019-03-19: 50 ug via INTRAVENOUS

## 2019-03-19 MED ORDER — ONDANSETRON HCL 4 MG/2ML IJ SOLN
INTRAMUSCULAR | Status: AC
  Filled 2019-03-19: qty 2

## 2019-03-19 MED ORDER — ALUM & MAG HYDROXIDE-SIMETH 200-200-20 MG/5ML PO SUSP
30.0000 mL | ORAL | Status: DC | PRN
  Administered 2019-03-20: 30 mL via ORAL
  Filled 2019-03-19: qty 30

## 2019-03-19 MED ORDER — FENTANYL CITRATE (PF) 100 MCG/2ML IJ SOLN
INTRAMUSCULAR | Status: AC
  Filled 2019-03-19: qty 2

## 2019-03-19 MED ORDER — CEFAZOLIN SODIUM-DEXTROSE 2-4 GM/100ML-% IV SOLN
2.0000 g | INTRAVENOUS | Status: AC
  Administered 2019-03-19: 14:00:00 2 g via INTRAVENOUS
  Filled 2019-03-19: qty 100

## 2019-03-19 MED ORDER — DOCUSATE SODIUM 100 MG PO CAPS
100.0000 mg | ORAL_CAPSULE | Freq: Two times a day (BID) | ORAL | Status: DC
  Administered 2019-03-19 – 2019-03-22 (×6): 100 mg via ORAL
  Filled 2019-03-19 (×6): qty 1

## 2019-03-19 MED ORDER — LEVOTHYROXINE SODIUM 100 MCG PO TABS
200.0000 ug | ORAL_TABLET | ORAL | Status: DC
  Administered 2019-03-20 – 2019-03-21 (×2): 200 ug via ORAL
  Filled 2019-03-19 (×2): qty 2

## 2019-03-19 MED ORDER — POVIDONE-IODINE 10 % EX SWAB
2.0000 "application " | Freq: Once | CUTANEOUS | Status: AC
  Administered 2019-03-19: 2 via TOPICAL

## 2019-03-19 MED ORDER — METOCLOPRAMIDE HCL 5 MG/ML IJ SOLN: 5.0000 mg | Freq: Three times a day (TID) | INTRAMUSCULAR | Status: DC | PRN

## 2019-03-19 MED ORDER — OXYCODONE HCL 5 MG PO TABS
10.0000 mg | ORAL_TABLET | ORAL | Status: DC | PRN
  Administered 2019-03-19 – 2019-03-20 (×4): 10 mg via ORAL
  Filled 2019-03-19 (×2): qty 2

## 2019-03-19 MED ORDER — EPHEDRINE 5 MG/ML INJ
INTRAVENOUS | Status: AC
  Filled 2019-03-19: qty 10

## 2019-03-19 MED ORDER — PROPOFOL 10 MG/ML IV BOLUS
INTRAVENOUS | Status: AC
  Filled 2019-03-19: qty 20

## 2019-03-19 MED ORDER — LISINOPRIL 10 MG PO TABS
10.0000 mg | ORAL_TABLET | Freq: Every day | ORAL | Status: DC
  Administered 2019-03-20 – 2019-03-22 (×3): 10 mg via ORAL
  Filled 2019-03-19 (×3): qty 1

## 2019-03-19 MED ORDER — LAMOTRIGINE 150 MG PO TABS
400.0000 mg | ORAL_TABLET | Freq: Every evening | ORAL | Status: DC
  Administered 2019-03-19 – 2019-03-21 (×3): 400 mg via ORAL
  Filled 2019-03-19 (×4): qty 1

## 2019-03-19 MED ORDER — LISINOPRIL-HYDROCHLOROTHIAZIDE 10-12.5 MG PO TABS: 1.0000 | ORAL_TABLET | Freq: Every day | ORAL | Status: DC

## 2019-03-19 MED ORDER — EPHEDRINE SULFATE-NACL 50-0.9 MG/10ML-% IV SOSY
PREFILLED_SYRINGE | INTRAVENOUS | Status: DC | PRN
  Administered 2019-03-19: 5 mg via INTRAVENOUS
  Administered 2019-03-19 (×2): 10 mg via INTRAVENOUS

## 2019-03-19 MED ORDER — TRANEXAMIC ACID-NACL 1000-0.7 MG/100ML-% IV SOLN
1000.0000 mg | INTRAVENOUS | Status: AC
  Administered 2019-03-19: 14:00:00 1000 mg via INTRAVENOUS
  Filled 2019-03-19: qty 100

## 2019-03-19 MED ORDER — PROPOFOL 10 MG/ML IV BOLUS
INTRAVENOUS | Status: DC | PRN
  Administered 2019-03-19 (×2): 10 mg via INTRAVENOUS
  Administered 2019-03-19 (×3): 20 mg via INTRAVENOUS
  Administered 2019-03-19 (×2): 10 mg via INTRAVENOUS

## 2019-03-19 MED ORDER — PROPOFOL 500 MG/50ML IV EMUL
INTRAVENOUS | Status: DC | PRN
  Administered 2019-03-19: 100 ug/kg/min via INTRAVENOUS

## 2019-03-19 MED ORDER — BUPIVACAINE IN DEXTROSE 0.75-8.25 % IT SOLN
INTRATHECAL | Status: DC | PRN
  Administered 2019-03-19: 1.8 mL via INTRATHECAL

## 2019-03-19 MED ORDER — METHOCARBAMOL 500 MG IVPB - SIMPLE MED
INTRAVENOUS | Status: AC
  Filled 2019-03-19: qty 50

## 2019-03-19 MED ORDER — HYDROMORPHONE HCL 1 MG/ML IJ SOLN
0.5000 mg | INTRAMUSCULAR | Status: DC | PRN
  Administered 2019-03-20 (×2): 1 mg via INTRAVENOUS
  Filled 2019-03-19 (×2): qty 1

## 2019-03-19 MED ORDER — STERILE WATER FOR IRRIGATION IR SOLN
Status: DC | PRN
  Administered 2019-03-19: 2000 mL

## 2019-03-19 MED ORDER — LEVOTHYROXINE SODIUM 200 MCG PO TABS: 200.0000 ug | ORAL_TABLET | ORAL | Status: DC

## 2019-03-19 MED ORDER — ONDANSETRON HCL 4 MG/2ML IJ SOLN: 4.0000 mg | Freq: Four times a day (QID) | INTRAMUSCULAR | Status: DC | PRN

## 2019-03-19 SURGICAL SUPPLY — 44 items
APL SKNCLS STERI-STRIP NONHPOA (GAUZE/BANDAGES/DRESSINGS)
BAG SPEC THK2 15X12 ZIP CLS (MISCELLANEOUS)
BAG ZIPLOCK 12X15 (MISCELLANEOUS) IMPLANT
BALL HIP ARTICU EZE 36 8.5 (Hips) IMPLANT
BENZOIN TINCTURE PRP APPL 2/3 (GAUZE/BANDAGES/DRESSINGS) IMPLANT
BLADE SAW SGTL 18X1.27X75 (BLADE) ×2 IMPLANT
BLADE SURG SZ10 CARB STEEL (BLADE) ×4 IMPLANT
CLSR STERI-STRIP ANTIMIC 1/2X4 (GAUZE/BANDAGES/DRESSINGS) IMPLANT
COVER PERINEAL POST (MISCELLANEOUS) ×2 IMPLANT
COVER SURGICAL LIGHT HANDLE (MISCELLANEOUS) ×2 IMPLANT
COVER WAND RF STERILE (DRAPES) IMPLANT
DRAPE STERI IOBAN 125X83 (DRAPES) ×2 IMPLANT
DRAPE U-SHAPE 47X51 STRL (DRAPES) ×4 IMPLANT
DRSG AQUACEL AG ADV 3.5X10 (GAUZE/BANDAGES/DRESSINGS) ×2 IMPLANT
DURAPREP 26ML APPLICATOR (WOUND CARE) ×2 IMPLANT
ELECT REM PT RETURN 15FT ADLT (MISCELLANEOUS) ×2 IMPLANT
GAUZE XEROFORM 1X8 LF (GAUZE/BANDAGES/DRESSINGS) ×2 IMPLANT
GLOVE BIO SURGEON STRL SZ7.5 (GLOVE) ×2 IMPLANT
GLOVE BIOGEL PI IND STRL 8 (GLOVE) ×2 IMPLANT
GLOVE BIOGEL PI INDICATOR 8 (GLOVE) ×2
GLOVE ECLIPSE 8.0 STRL XLNG CF (GLOVE) ×2 IMPLANT
GOWN STRL REUS W/TWL XL LVL3 (GOWN DISPOSABLE) ×4 IMPLANT
HANDPIECE INTERPULSE COAX TIP (DISPOSABLE) ×2
HIP BALL ARTICU EZE 36 8.5 (Hips) ×2 IMPLANT
HOLDER FOLEY CATH W/STRAP (MISCELLANEOUS) ×2 IMPLANT
KIT TURNOVER KIT A (KITS) IMPLANT
LINER ACETAB NEUTRAL 36ID 520D (Liner) ×1 IMPLANT
PACK ANTERIOR HIP CUSTOM (KITS) ×2 IMPLANT
PADDING CAST COTTON 6X4 STRL (CAST SUPPLIES) ×1 IMPLANT
PIN SECTOR W/GRIP ACE CUP 52MM (Hips) ×1 IMPLANT
SET HNDPC FAN SPRY TIP SCT (DISPOSABLE) ×1 IMPLANT
STAPLER VISISTAT (STAPLE) ×1 IMPLANT
STAPLER VISISTAT 35W (STAPLE) IMPLANT
STEM FEMORAL SZ5 HIGH ACTIS (Stem) ×1 IMPLANT
STRIP CLOSURE SKIN 1/2X4 (GAUZE/BANDAGES/DRESSINGS) IMPLANT
SUT ETHIBOND NAB CT1 #1 30IN (SUTURE) ×2 IMPLANT
SUT ETHILON 2 0 PS N (SUTURE) IMPLANT
SUT MNCRL AB 4-0 PS2 18 (SUTURE) IMPLANT
SUT VIC AB 0 CT1 36 (SUTURE) ×2 IMPLANT
SUT VIC AB 1 CT1 36 (SUTURE) ×2 IMPLANT
SUT VIC AB 2-0 CT1 27 (SUTURE) ×4
SUT VIC AB 2-0 CT1 TAPERPNT 27 (SUTURE) ×2 IMPLANT
TRAY FOLEY MTR SLVR 14FR STAT (SET/KITS/TRAYS/PACK) ×1 IMPLANT
YANKAUER SUCT BULB TIP 10FT TU (MISCELLANEOUS) ×2 IMPLANT

## 2019-03-19 NOTE — Anesthesia Postprocedure Evaluation (Signed)
Anesthesia Post Note  Patient: Tracy [name]  Procedure(s) Performed: LEFT TOTAL HIP ARTHROPLASTY ANTERIOR APPROACH (Left Hip)     Patient location during evaluation: PACU Anesthesia Type: Spinal Level of consciousness: oriented and awake and alert Pain management: pain level controlled Vital Signs Assessment: post-procedure vital signs reviewed and stable Respiratory status: spontaneous breathing, respiratory function stable and patient connected to nasal cannula oxygen Cardiovascular status: blood pressure returned to baseline and stable Postop Assessment: no headache, no backache and no apparent nausea or vomiting Anesthetic complications: no    Last Vitals:  Vitals:   03/19/19 1530 03/19/19 1545  BP: 114/68 110/75  Pulse: 65   Resp: 16 10  Temp:    SpO2: 100%     Last Pain:  Vitals:   03/19/19 1615  TempSrc:   PainSc: 0-No pain      LLE Sensation: Tingling;No pain (03/19/19 1615)   RLE Sensation: Tingling (03/19/19 1615) L Sensory Level: S1-Sole of foot, small toes (03/19/19 1615) R Sensory Level: S1-Sole of foot, small toes (03/19/19 1615)  [place]

## 2019-03-19 NOTE — Transfer of Care (Signed)
Immediate Anesthesia Transfer of Care Note  Patient: Tracy [name]  Procedure(s) Performed: LEFT TOTAL HIP ARTHROPLASTY ANTERIOR APPROACH (Left Hip)  Patient Location: PACU  Anesthesia Type:Spinal  Level of Consciousness: awake, alert  and oriented  Airway & Oxygen Therapy: Patient Spontanous Breathing and Patient connected to face mask oxygen  Post-op Assessment: Report given to RN and Post -op Vital signs reviewed and stable  Post vital signs: Reviewed and stable  Last Vitals:  Vitals Value Taken Time  BP    Temp    Pulse 67 03/19/19 1525  Resp 15 03/19/19 1525  SpO2 100 % 03/19/19 1525  Vitals shown include unvalidated device data.  Last Pain:  Vitals:   03/19/19 1049  TempSrc: Oral  PainSc:       Patients Stated Pain Goal: 3 (95/32/02 3343)  Complications: No apparent anesthesia complications

## 2019-03-19 NOTE — Op Note (Signed)
NAME: Tracy [name], Tracy [name] [facility] MEDICAL RECORD [id number] ACCOUNT [id number] DATE OF BIRTH:[birth year] FACILITY: [facility] LOCATION: [facility] PHYSICIAN:CHRISTOPHER Kerry Fort, MD  OPERATIVE REPORT  DATE OF PROCEDURE:  03/19/2019  PREOPERATIVE DIAGNOSIS:  Primary osteoarthritis and degenerative joint disease, left hip.  POSTOPERATIVE DIAGNOSIS:  Primary osteoarthritis and degenerative joint disease, left hip.  PROCEDURE:  Left total hip arthroplasty, direct anterior approach.  IMPLANTS:  DePuy Sector Gription acetabular component size 52, size 36+0 polyethylene liner, size 5 Actis femoral component with high offset, size 36+8.5 metal hip ball.  SURGEON:  Lind Guest. Ninfa Linden, MD  ASSISTANT:  Erskine Emery, PA-C  ANESTHESIA:  Spinal.  ANTIBIOTICS:  Two grams IV Ancef.  ESTIMATED BLOOD LOSS:  462 mL  COMPLICATIONS:  None.  INDICATIONS:  The patient is a 53 year old morbidly obese female with polycystic disease and also has severe debilitating arthritis involving her left hip.  She has x-rays showing complete loss of joint space.  Her pain is daily and is detrimentally  affecting her mobility, her quality of life and her activities of daily living to the point she does wish to proceed with total hip arthroplasty.  We talked about the risk of acute blood loss anemia, nerve or vessel injury, fracture, infection,  dislocation, DVT, implant failure and soft tissue issues which are all heightened given her morbid obesity.  She understands our goals are to decrease pain, improve mobility and overall improve quality of life.  DESCRIPTION OF PROCEDURE:  After informed consent was obtained and appropriate left hip was marked, she was brought to the operating room and sat up on a stretcher where spinal anesthesia was then obtained.  She was then laid in supine position on a  stretcher.  Foley catheter was placed.  Both feet had traction boots applied to them.  Next, she was placed supine on  the Hana fracture table, the perineal post in place and both legs in line skeletal traction device and no traction applied.  Her left  operative hip was prepped and draped with DuraPrep and sterile drapes.  A time-out was called to identify correct patient and correct left hip.  We then made an incision just inferior and posterior to the anterior superior iliac spine and carried this  obliquely down the leg.  We dissected down tensor fascia lata muscle.  Tensor fascia was then divided longitudinally to proceed with direct anterior approach to the hip.  We then identified the hip capsule, opened the hip capsule in an L-type format.  We  also identified and cauterized circumflex vessels.  We then opened up the hip capsule finding large joint effusion and significant arthritis around the femoral head and neck.  We placed a Cobra retractor around the medial and lateral femoral neck and we  made our femoral neck cut with an oscillating saw and completed this with an osteotome.  We placed a corkscrew guide in the femoral head and removed the femoral head in its entirety and found a wide area devoid of cartilage.  The case had gotten  certainly difficult from here given her morbid obesity and her soft tissue issues.  I was able to place a bent Hohmann over the medial acetabular rim and remove remnants of the acetabular labrum and other debris from the hip.  We then began reaming under  direct visualization from a size 42 reamer in stepwise increments going all the way to a size 51 with all reamers under direct visualization, the last reamer under direct fluoroscopy, so we could obtain our  depth of reaming, our inclination and  anteversion.  I then placed the real DePuy Sector Gription acetabular component size 52 and a 36+0 neutral polyethylene liner.  Attention was then turned to the femur.  With the leg externally rotated to 120 degrees, extended and adducted, we were  placing Mueller retractor medially and  Hohman retractor behind the greater trochanter.  We had to release significantly lateral joint tissue over the lateral capsule as well and then began broaching using the Actis broaching system from a size 0 up to a  size 5.  With the size 5 in place, we trialed a high offset femoral neck and a 36+1.5 hip ball, reduced this in the acetabulum and was definitely needing more leg length and offset due to instability.  We dislocated the hip and removed the trial  components.  We placed the real Actis femoral component size 5 with high offset.  We went with a 36+8.5 metal hip ball, reduced this in the acetabulum.  We were pleased with the leg length, offset, range of motion and stability.  We then irrigated the  soft tissue with normal saline solution using pulsatile lavage.  We closed the tensor fascia with interrupted #1 Vicryl suture followed by closing the deep tissue with 0 Vicryl, 2-0 Vicryl was used to close the subcutaneous tissue and interrupted staples  as well as nylon suture was used to close the skin.  Xeroform and Aquacel dressing was applied.  She was then taken off the Hana table and taken to recovery room in stable condition.  All final counts were correct.  There were no complications noted.   Of note, Benita Stabile, PA-C, assisted in the entire case.  His assistance was crucial for facilitating all aspects of this case.  TN/NUANCE  D:03/19/2019 T:03/19/2019 JOB:[id number]

## 2019-03-19 NOTE — Anesthesia Procedure Notes (Signed)
Spinal  Patient location during procedure: OR Start time: 03/19/2019 1:23 PM End time: 03/19/2019 1:28 PM Staffing Anesthesiologist: Suzette Battiest, MD Performed: anesthesiologist  Preanesthetic Checklist Completed: patient identified, site marked, surgical consent, pre-op evaluation, timeout performed, IV checked, risks and benefits discussed and monitors and equipment checked Spinal Block Patient position: sitting Prep: DuraPrep Patient monitoring: heart rate, cardiac monitor, continuous pulse ox and blood pressure Approach: midline Location: L3-4 Injection technique: single-shot Needle Needle type: Pencan  Needle gauge: 24 G Needle length: 9 cm Assessment Sensory level: T4

## 2019-03-19 NOTE — H&P (Signed)
TOTAL HIP ADMISSION H&P  Patient is admitted for left total hip arthroplasty.  Subjective:  Chief Complaint: left hip pain  HPI: Tracy [name], 53 y.o. female, has a history of pain and functional disability in the left hip(s) due to arthritis and patient has failed non-surgical conservative treatments for greater than 12 weeks to include NSAID's and/or analgesics, corticosteriod injections, flexibility and strengthening excercises, supervised PT with diminished ADL's post treatment, use of assistive devices, weight reduction as appropriate and activity modification.  Onset of symptoms was gradual starting 3 years ago with gradually worsening course since that time.The patient noted no past surgery on the left hip(s).  Patient currently rates pain in the left hip at 10 out of 10 with activity. Patient has night pain, worsening of pain with activity and weight bearing, trendelenberg gait, pain that interfers with activities of daily living, pain with passive range of motion and crepitus. Patient has evidence of subchondral cysts, subchondral sclerosis, periarticular osteophytes and joint space narrowing by imaging studies. This condition presents safety issues increasing the risk of falls.  There is no current active infection.  Patient Active Problem List   Diagnosis Date Noted  . Unilateral primary osteoarthritis, left hip 02/10/2019  . Greater trochanteric bursitis of left hip 07/24/2018  . Arthritis of left hip 06/25/2018  . Encounter for general adult medical examination with abnormal findings 01/04/2018  . Vitamin D deficiency 01/04/2018  . Lower back pain 07/18/2017  . Hemorrhoids, internal 07/18/2017  . Trigeminal neuralgia 01/18/2016  . Bipolar disorder ([place]) 11/28/2015  . Borderline personality disorder ([place]) 11/28/2015  . Carpal tunnel syndrome 09/06/2015  . MDD (major depressive disorder) 06/06/2015  . Severe episode of recurrent major depressive disorder, without  psychotic features ([place])   . History of posttraumatic stress disorder (PTSD)   . GAD (generalized anxiety disorder)   . Insulin resistance 02/09/2015  . 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 Acanthosis nigricans, acquired 02/09/2015  . Nonalcoholic fatty liver disease 02/09/2015  . Hepatic cirrhosis ([place]) 02/09/2015  . Female hirsutism 02/09/2015  . Oligomenorrhea 02/09/2015  . Infertility associated with anovulation 02/09/2015  . Morbid obesity ([place]) 10/31/2014  . Anxiety and depression 10/31/2014  . Obstructive sleep apnea 06/27/2014  . Diabetes mellitus, type 2 ([place]) 06/10/2013  . 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 Cystic teratoma   . PCOS (polycystic ovarian syndrome)   . Infertility, female   . TOBACCO USE, QUIT 02/08/2010  . HYPOTHYROIDISM, POSTSURGICAL 10/02/2007  . POLYCYSTIC OVARIAN DISEASE 10/02/2007  . Elevated cholesterol 10/02/2007  . DEPRESSION 10/02/2007  . Migraine 10/02/2007  . Essential hypertension 10/02/2007  . RENAL CALCULUS, HX OF 10/02/2007   Past Medical History:  Diagnosis Date  . Anxiety   . Arthritis    back, knees, right elbow  . Bipolar disorder ([place])   . Cirrhosis ([place])   . Dental crowns present   . Depression   . Diabetes mellitus without complication ([place])   . GERD (gastroesophageal reflux disease)   . High cholesterol    no current med.  Marland Kitchen History of kidney stones   . History of migraine headaches   . Hypertension    under control with meds., has been on med. x 4 yr.  . Hypothyroidism   . NASH (nonalcoholic steatohepatitis)   . PCOS (polycystic ovarian syndrome)   . PONV (postoperative nausea and vomiting)    also hx. of emergence delirium 2007  . Sleep apnea    uses CPAP nightly  . Trigeminal neuralgia  Past Surgical History:  Procedure Laterality Date  . ACHILLES TENDON SURGERY Left 2007  . APPENDECTOMY    . CARPAL TUNNEL RELEASE Right 01/12/2016   Procedure: RIGHT CARPAL TUNNEL RELEASE;  Surgeon: Roseanne Kaufman, MD;   Location: [facility];  Service: Orthopedics;  Laterality: Right;  . COLONOSCOPY WITH PROPOFOL N/A 04/20/2014   Procedure: COLONOSCOPY WITH PROPOFOL;  Surgeon: Arta Silence, MD;  Location: [facility];  Service: Endoscopy;  Laterality: N/A;  . ESOPHAGOGASTRODUODENOSCOPY (EGD) WITH PROPOFOL N/A 04/20/2014   Procedure: ESOPHAGOGASTRODUODENOSCOPY (EGD) WITH PROPOFOL;  Surgeon: Arta Silence, MD;  Location: [facility];  Service: Endoscopy;  Laterality: N/A;  . EXCISION HAGLUND'S DEFORMITY WITH ACHILLES TENDON REPAIR Right 02/25/2013   Procedure: RIGHT ACHILLES DEBRIDEMENT AND RECONSTRUCTION;  HAGLUND'S EXCISION; GASTROC RECESSION AND FLEXOR HALLUCIS LONGUS TRANSFER;  Surgeon: Wylene Simmer, MD;  Location: [facility];  Service: Orthopedics;  Laterality: Right;  . LUMBAR LAMINECTOMY     X 3  . neck fusion    . OVARIAN CYST SURGERY Left 1998  . PILONIDAL CYST EXCISION  1994  . RIGHT OOPHORECTOMY    . STERIOD INJECTION Left 01/12/2016   Procedure: STEROID INJECTION LEFT WRIST;  Surgeon: Roseanne Kaufman, MD;  Location: [facility];  Service: Orthopedics;  Laterality: Left;  . TOTAL THYROIDECTOMY  2003  . UPPER GI ENDOSCOPY  01/19/2015    No current facility-administered medications for this encounter.    Current Outpatient Medications  Medication Sig Dispense Refill Last Dose  . clonazePAM (KLONOPIN) 1 MG tablet Take 1 mg by mouth 3 (three) times daily.   5   . FLUoxetine (PROZAC) 40 MG capsule Take 40 mg by mouth every morning.   0   . fluticasone (FLONASE) 50 MCG/ACT nasal spray Place 1 spray into both nostrils daily as needed for allergies or rhinitis.     Marland Kitchen lamoTRIgine (LAMICTAL) 200 MG tablet Take 400 mg by mouth every evening.   1   . lisinopril-hydrochlorothiazide (ZESTORETIC) 10-12.5 MG tablet TAKE 1 TABLET BY MOUTH DAILY. **NEED VISIT** **NO FURTHER REFILLS UNTIL SEEN** (Patient taking differently: Take 1 tablet by mouth daily. ) 90 tablet 1   .  loratadine (CLARITIN) 10 MG tablet Take 10 mg by mouth daily as needed for allergies.     . meloxicam (MOBIC) 15 MG tablet TAKE 1 TABLET BY MOUTH ONCE DAILY 30 tablet 0   . ondansetron (ZOFRAN-ODT) 4 MG disintegrating tablet Take 4 mg by mouth every 4 (four) hours as needed for nausea or vomiting.      . RABEprazole (ACIPHEX) 20 MG tablet Take one tablet, daily. (Patient taking differently: Take 20 mg by mouth 2 (two) times daily. Take one tablet, daily) 90 tablet 1   . rizatriptan (MAXALT) 10 MG tablet Take 1 tablet (10 mg total) by mouth as needed for migraine. May repeat in 2 hours if needed 10 tablet 0   . SYNTHROID 200 MCG tablet Take 300 mcg 3 days a week and 200 mcg 4 days a week. (Patient taking differently: Take 200-300 mcg by mouth See admin instructions. Monday and Tues 300 mcg, and 200 mcg Wednesday - Sunday) 135 tablet 1    Allergies  Allergen Reactions  . Sulfa Antibiotics Itching, Swelling and Rash  . Bupropion Hcl Itching and Rash  . Codeine Itching  . Sulfamethoxazole-Trimethoprim Rash    Social History   Tobacco Use  . Smoking status: Former Smoker    Packs/day: 0.50    Years: 15.00  Pack years: 7.50    Types: Cigarettes    Quit date: 02/2019    Years since quitting: 0.1  . Smokeless tobacco: Never Used  Substance Use Topics  . Alcohol use: No    Alcohol/week: 0.0 standard drinks    Comment: Has not had any alcohol since 07/2014 - before this date, she rarely drink.    Family History  Problem Relation Age of Onset  . Heart disease Father   . Depression Father   . Cancer Brother        THROAT  . Depression Brother   . Anxiety disorder Brother   . Depression Sister   . Breast cancer Mother   . Hypertension Mother      Review of Systems  Musculoskeletal: Positive for joint pain.  All other systems reviewed and are negative.   Objective:  Physical Exam  Constitutional: She is oriented to person, place, and time. She appears well-developed and  well-nourished.  HENT:  Head: Normocephalic and atraumatic.  Eyes: Pupils are equal, round, and reactive to light. EOM are normal.  Neck: Normal range of motion. Neck supple.  Cardiovascular: Normal rate.  Respiratory: Effort normal.  GI: Soft.  Musculoskeletal:     Left hip: She exhibits decreased range of motion, decreased strength, tenderness and bony tenderness.  Neurological: She is alert and oriented to person, place, and time.  Skin: Skin is warm and dry.  Psychiatric: She has a normal mood and affect.    Vital signs in last 24 hours:    Labs:   Estimated body mass index is 35.98 kg/m as calculated from the following:   Height as of 03/16/19: 5\' 11"  (1.803 m).   Weight as of 03/16/19: 117 kg.   Imaging Review Plain radiographs demonstrate severe degenerative joint disease of the left hip(s). The bone quality appears to be good for age and reported activity level.      Assessment/Plan:  End stage arthritis, left hip(s)  The patient history, physical examination, clinical judgement of the provider and imaging studies are consistent with end stage degenerative joint disease of the left hip(s) and total hip arthroplasty is deemed medically necessary. The treatment options including medical management, injection therapy, arthroscopy and arthroplasty were discussed at length. The risks and benefits of total hip arthroplasty were presented and reviewed. The risks due to aseptic loosening, infection, stiffness, dislocation/subluxation,  thromboembolic complications and other imponderables were discussed.  The patient acknowledged the explanation, agreed to proceed with the plan and consent was signed. Patient is being admitted for inpatient treatment for surgery, pain control, PT, OT, prophylactic antibiotics, VTE prophylaxis, progressive ambulation and ADL's and discharge planning.The patient is planning to be discharged home with home health services    Patient's anticipated  LOS is less than 2 midnights, meeting these requirements: - Younger than 33 - Lives within 1 hour of care - Has a competent adult at home to recover with post-op recover - NO history of  - Chronic pain requiring opiods  - Diabetes  - Coronary Artery Disease  - Heart failure  - Heart attack  - Stroke  - DVT/VTE  - Cardiac arrhythmia  - Respiratory Failure/COPD  - Renal failure  - Anemia  - Advanced Liver disease

## 2019-03-19 NOTE — Anesthesia Preprocedure Evaluation (Addendum)
Anesthesia Evaluation  Patient identified by MRN, date of birth, ID band Patient awake    Reviewed: Allergy & Precautions, NPO status , Patient's Chart, lab work & pertinent test results  History of Anesthesia Complications (+) PONV and Emergence Delirium  Airway Mallampati: II  TM Distance: >3 FB Neck ROM: Full    Dental  (+) Dental Advisory Given   Pulmonary sleep apnea (BiPAP) , former smoker,    breath sounds clear to auscultation       Cardiovascular hypertension, negative cardio ROS   Rhythm:Regular Rate:Normal     Neuro/Psych  Headaches, S/p lumbar and cervical fusion   Neuromuscular disease    GI/Hepatic GERD  ,(+) Hepatitis -  Endo/Other  diabetesHypothyroidism   Renal/GU negative Renal ROS     Musculoskeletal  (+) Arthritis ,   Abdominal   Peds  Hematology negative hematology ROS (+)   Anesthesia Other Findings   Reproductive/Obstetrics                            Lab Results  Component Value Date   WBC 5.1 03/16/2019   HGB 14.0 03/16/2019   HCT 44.2 03/16/2019   MCV 87.5 03/16/2019   PLT 208 03/16/2019   Lab Results  Component Value Date   CREATININE 0.92 03/16/2019   BUN 18 03/16/2019   NA 138 03/16/2019   K 3.7 03/16/2019   CL 103 03/16/2019   CO2 27 03/16/2019    Anesthesia Physical Anesthesia Plan  ASA: II  Anesthesia Plan: Spinal   Post-op Pain Management:    Induction:   PONV Risk Score and Plan: 2 and Propofol infusion, Ondansetron and Treatment may vary due to age or medical condition  Airway Management Planned: Natural Airway and Simple Face Mask  Additional Equipment:   Intra-op Plan:   Post-operative Plan:   Informed Consent: I have reviewed the patients History and Physical, chart, labs and discussed the procedure including the risks, benefits and alternatives for the proposed anesthesia with the patient or authorized representative who  has indicated his/her understanding and acceptance.       Plan Discussed with: CRNA  Anesthesia Plan Comments:         Anesthesia Quick Evaluation

## 2019-03-19 NOTE — Brief Op Note (Signed)
03/19/2019  2:55 PM  PATIENT:  Tracy [name]  53 y.o. female  PRE-OPERATIVE DIAGNOSIS:  OSTEOARTHRITIS LEFT HIP  POST-OPERATIVE DIAGNOSIS:  OSTEOARTHRITIS LEFT HIP  PROCEDURE:  Procedure(s): LEFT TOTAL HIP ARTHROPLASTY ANTERIOR APPROACH (Left)  SURGEON:  Surgeon(s) and Role:    Mcarthur Rossetti, MD - Primary  PHYSICIAN ASSISTANT:  Benita Stabile, PA-C  ANESTHESIA:   spinal  EBL:  600 mL   COUNTS:  YES  DICTATION: .Other Dictation: Dictation Number [id number]  PLAN OF CARE: Admit to inpatient   PATIENT DISPOSITION:  PACU - hemodynamically stable.   Delay start of Pharmacological VTE agent (>24hrs) due to surgical blood loss or risk of bleeding: no

## 2019-03-20 LAB — CBC
HCT: 35.9 % — ABNORMAL LOW (ref 36.0–46.0)
Hemoglobin: 11.3 g/dL — ABNORMAL LOW (ref 12.0–15.0)
MCH: 28.1 pg (ref 26.0–34.0)
MCHC: 31.5 g/dL (ref 30.0–36.0)
MCV: 89.3 fL (ref 80.0–100.0)
Platelets: 169 10*3/uL (ref 150–400)
RBC: 4.02 MIL/uL (ref 3.87–5.11)
RDW: 15.3 % (ref 11.5–15.5)
WBC: 7.3 10*3/uL (ref 4.0–10.5)
nRBC: 0 % (ref 0.0–0.2)

## 2019-03-20 LAB — BASIC METABOLIC PANEL
Anion gap: 4 — ABNORMAL LOW (ref 5–15)
BUN: 8 mg/dL (ref 6–20)
CO2: 27 mmol/L (ref 22–32)
Calcium: 8.7 mg/dL — ABNORMAL LOW (ref 8.9–10.3)
Chloride: 108 mmol/L (ref 98–111)
Creatinine, Ser: 0.69 mg/dL (ref 0.44–1.00)
GFR calc Af Amer: >60 mL/min (ref >60)
GFR calc non Af Amer: >60 mL/min (ref >60)
Glucose, Bld: 111 mg/dL — ABNORMAL HIGH (ref 70–99)
Potassium: 4.2 mmol/L (ref 3.5–5.1)
Sodium: 139 mmol/L (ref 135–145)

## 2019-03-20 MED ORDER — ASPIRIN 81 MG PO CHEW: 81.0000 mg | CHEWABLE_TABLET | Freq: Two times a day (BID) | ORAL | 0 refills | Status: DC

## 2019-03-20 MED ORDER — CYCLOBENZAPRINE HCL 10 MG PO TABS: 10.0000 mg | ORAL_TABLET | Freq: Three times a day (TID) | ORAL | 1 refills | Status: DC | PRN

## 2019-03-20 MED ORDER — KETOROLAC TROMETHAMINE 15 MG/ML IJ SOLN
15.0000 mg | Freq: Four times a day (QID) | INTRAMUSCULAR | Status: AC
  Administered 2019-03-20 – 2019-03-21 (×2): 15 mg via INTRAVENOUS
  Filled 2019-03-20 (×3): qty 1

## 2019-03-20 MED ORDER — DOXYCYCLINE HYCLATE 100 MG PO TABS: 100.0000 mg | ORAL_TABLET | Freq: Two times a day (BID) | ORAL | 0 refills | Status: DC

## 2019-03-20 MED ORDER — CYCLOBENZAPRINE HCL 10 MG PO TABS
10.0000 mg | ORAL_TABLET | Freq: Three times a day (TID) | ORAL | Status: DC | PRN
  Administered 2019-03-21: 10 mg via ORAL
  Filled 2019-03-20: qty 1

## 2019-03-20 MED ORDER — OXYCODONE HCL 5 MG PO TABS: 5.0000 mg | ORAL_TABLET | ORAL | 0 refills | Status: DC | PRN

## 2019-03-20 MED ORDER — DOXYCYCLINE HYCLATE 100 MG PO TABS
100.0000 mg | ORAL_TABLET | Freq: Two times a day (BID) | ORAL | Status: DC
  Administered 2019-03-20 – 2019-03-22 (×5): 100 mg via ORAL
  Filled 2019-03-20 (×5): qty 1

## 2019-03-20 NOTE — Progress Notes (Signed)
Pt. Stated that she did not want to wear CPAP this shift.  She stated that it was just "to much".  Reminded patient that I am available if she changes her mind.

## 2019-03-20 NOTE — Evaluation (Signed)
Physical Therapy Evaluation Patient Details Name: Tracy [name] MRN: [id number] DOB: Oct 15, 1965 Today's Date: 03/20/2019   History of Present Illness  Pt s/p L THR and with hx of bipolar, lumbar fusion, and trigeminal neuralgia  Clinical Impression  Pt s/p L THR and presents with decreased L LE strength/ROM and post op pain limiting functional mobility.  Pt should progress to dc home with family assist.    Follow Up Recommendations Home health PT;Follow surgeon's recommendation for DC plan and follow-up therapies    Equipment Recommendations  None recommended by PT    Recommendations for Other Services       Precautions / Restrictions Precautions Precautions: Fall Restrictions Weight Bearing Restrictions: No LLE Weight Bearing: Weight bearing as tolerated      Mobility  Bed Mobility Overal bed mobility: Needs Assistance Bed Mobility: Supine to Sit     Supine to sit: Min assist     General bed mobility comments: cues for sequence and use of R LE to self assist.  Pt utilizing bedrail and requiring assist to manage L LE  Transfers Overall transfer level: Needs assistance Equipment used: Rolling walker (2 wheeled) Transfers: Sit to/from Stand Sit to Stand: Min assist;From elevated surface         General transfer comment: cues for LE management and use of UEs to self assist  Ambulation/Gait Ambulation/Gait assistance: Min assist;+2 safety/equipment Gait Distance (Feet): 45 Feet Assistive device: Rolling walker (2 wheeled) Gait Pattern/deviations: Step-to pattern;Decreased step length - right;Decreased step length - left;Shuffle;Trunk flexed Gait velocity: decr   General Gait Details: cues for sequence, posture, position from RW and stride length  Stairs            Wheelchair Mobility    Modified Rankin (Stroke Patients Only)       Balance Overall balance assessment: Mild deficits observed, not formally tested                                            Pertinent Vitals/Pain Pain Assessment: 0-10 Pain Score: 8  Pain Location: L hip Pain Descriptors / Indicators: Aching;Burning;Grimacing;Guarding;Tightness Pain Intervention(s): Limited activity within patient's tolerance;Monitored during session;Premedicated before session;Ice applied    Home Living Family/patient expects to be discharged to:: Private residence Living Arrangements: Spouse/significant other Available Help at Discharge: Family Type of Home: House Home Access: Stairs to enter;Ramped entrance(2 steps vs ramp) Entrance Stairs-Rails: None Entrance Stairs-Number of Steps: 2 Home Layout: One level Home Equipment: Walker - 2 wheels;Cane - single point;Crutches(comode riser)      Prior Function Level of [place]: Independent               Hand Dominance        Extremity/Trunk Assessment   Upper Extremity Assessment Upper Extremity Assessment: Overall WFL for tasks assessed    Lower Extremity Assessment Lower Extremity Assessment: LLE deficits/detail LLE: Unable to fully assess due to pain       Communication   Communication: No difficulties  Cognition Arousal/Alertness: Awake/alert Behavior During Therapy: WFL for tasks assessed/performed Overall Cognitive Status: Within Functional Limits for tasks assessed                                        General Comments      Exercises  Assessment/Plan    PT Assessment Patient needs continued PT services  PT Problem List Decreased strength;Decreased range of motion;Decreased activity tolerance;Decreased mobility;Decreased knowledge of use of DME;Obesity;Pain       PT Treatment Interventions DME instruction;Gait training;Stair training;Functional mobility training;Therapeutic activities;Therapeutic exercise;Patient/family education    PT Goals (Current goals can be found in the Care Plan section)  Acute Rehab PT Goals Patient Stated Goal:  Regain IND PT Goal Formulation: With patient Time For Goal Achievement: 03/27/19 Potential to Achieve Goals: Good    Frequency 7X/week   Barriers to discharge        Co-evaluation               AM-PAC PT "6 Clicks" Mobility  Outcome Measure Help needed turning from your back to your side while in a flat bed without using bedrails?: A Lot Help needed moving from lying on your back to sitting on the side of a flat bed without using bedrails?: A Little Help needed moving to and from a bed to a chair (including a wheelchair)?: A Little Help needed standing up from a chair using your arms (e.g., wheelchair or bedside chair)?: A Little Help needed to walk in hospital room?: A Little Help needed climbing 3-5 steps with a railing? : A Lot 6 Click Score: 16    End of Session Equipment Utilized During Treatment: Gait belt Activity Tolerance: Patient tolerated treatment well Patient left: in chair;with call bell/phone within reach;with chair alarm set;with family/visitor present Nurse Communication: Mobility status PT Visit Diagnosis: Difficulty in walking, not elsewhere classified (R26.2);Pain Pain - Right/Left: Left Pain - part of body: Hip    Time: 6378-5885 PT Time Calculation (min) (ACUTE ONLY): 20 min   Charges:   PT Evaluation $PT Eval Low Complexity: 1 Low          [place] Pager [phone] Office [phone]   [facility] 03/20/2019, 12:39 PM

## 2019-03-20 NOTE — Progress Notes (Signed)
0740 Patient used call button to report finding a tick in her hair.  Tick removed intact and placed in a labeled specimen cup.  Patients Nurse informed and requested to inform Dr Ninfa Linden.

## 2019-03-20 NOTE — Progress Notes (Signed)
Subjective: 1 Day Post-Op Procedure(s) (LRB): LEFT TOTAL HIP ARTHROPLASTY ANTERIOR APPROACH (Left) Patient reports pain as severe.  Didn't sleep well.  Also, a tick was found on her head.  Objective: Vital signs in last 24 hours: Temp:  [97.8 F (36.6 C)-98.3 F (36.8 C)] 97.9 F (36.6 C) (08/15 1044) Pulse Rate:  [57-87] 87 (08/15 1044) Resp:  [10-17] 15 (08/15 1044) BP: (106-146)/(65-87) 119/68 (08/15 1044) SpO2:  [98 %-100 %] 99 % (08/15 1044)  Intake/Output from previous day: 08/14 0701 - 08/15 0700 In: 4410 [P.O.:1310; I.V.:2850; IV Piggyback:250] Out: 4100 [Urine:3500; Blood:600] Intake/Output this shift: No intake/output data recorded.  Recent Labs    03/20/19 0300  HGB 11.3*   Recent Labs    03/20/19 0300  WBC 7.3  RBC 4.02  HCT 35.9*  PLT 169   Recent Labs    03/20/19 0300  NA 139  K 4.2  CL 108  CO2 27  BUN 8  CREATININE 0.69  GLUCOSE 111*  CALCIUM 8.7*   No results for input(s): LABPT, INR in the last 72 hours.  Sensation intact distally Intact pulses distally Dorsiflexion/Plantar flexion intact Incision: scant drainage   Assessment/Plan: 1 Day Post-Op Procedure(s) (LRB): LEFT TOTAL HIP ARTHROPLASTY ANTERIOR APPROACH (Left) Up with therapy Plan for discharge tomorrow Discharge home with home health      Mcarthur Rossetti 03/20/2019, 10:54 AM

## 2019-03-20 NOTE — Plan of Care (Signed)
Plan of care discussed.   

## 2019-03-20 NOTE — Progress Notes (Signed)
    Home health agencies that serve [phone].        Home Health Agencies Search Results  Results List Table  Home Health Agency Information Quality of Patient Care Rating Patient Survey Summary Rating  [facility] [phone] 4 out of 5 stars 4 out of 5 stars  [facility] [phone] 4 out of 5 stars 4 out of 5 stars  [facility] [phone] 4 out of 5 stars 4 out of 5 stars  [facility] [phone] 4 out of 5 stars 4 out of 5 stars  [facility] [phone] 3 out of 5 stars 4 out of 5 stars  [facility] [phone] 3 out of 5 stars 4 out of 5 stars  [facility] [phone] 4 out of 5 stars 5 out of 5 stars  [facility] [phone] 2  out of 5 stars 4 out of 5 stars  [facility] [phone] 5 out of 5 stars 4 out of 5 stars  [facility] [phone] 4  out of 5 stars 3 out of 5 stars   Home Health Footnotes  Footnote number Footnote as displayed on Home Health Compare  1 This agency provides services under a federal waiver program to non-traditional, chronic long term population.  2 This agency provides services to a special needs population.  3 Not Available.  4 The number of patient episodes for this measure is too small to report.  5 This measure currently does not have data or provider has been certified/recertified for less than 6 months.  6 The national average for this measure is not provided because of state-to-state differences in data collection.  7 Medicare is not displaying rates for this measure for any home health agency, because of an issue with the data.  8 There were problems with the data and they are being corrected.  9 Zero, or very few, patients met the survey's rules for inclusion. The scores shown, if any, reflect a very small number of surveys and may not accurately tell how an agency is  doing.  10 Survey results are based on less than 12 months of data.  11 Fewer than 70 patients completed the survey. Use the scores shown, if any, with caution as the number of surveys may be too low to accurately tell how an agency is doing.  12 No survey results are available for this period.  13 Data suppressed by CMS for one or more quarters.    

## 2019-03-20 NOTE — TOC Initial Note (Signed)
Transition of Care [facility]) - Initial/Assessment Note    Patient Details  Name: Tracy [name] MRN: [id number] Date of Birth: 18-Jan-1966  Transition of Care (TOC) CM/SW Contact:    Joaquin Courts, RN Phone Number: 03/20/2019, 11:41 AM  Clinical Narrative:          CM spoke with patient at bedside. Patient [facility] up with [facility] for [place]. Patient reports she has rolling walker and 3-in-1 at home.       Expected Discharge Plan: [place] Barriers to Discharge: Continued Medical Work up   Patient Goals and CMS Choice Patient states their goals for this hospitalization and ongoing recovery are:: to go home CMS Medicare.gov Compare Post Acute Care list provided to:: Patient Choice offered to / list presented to : Patient  Expected Discharge Plan and Services Expected Discharge Plan: [place]   Discharge Planning Services: CM Consult Post Acute Care Choice: [place] arrangements for the past 2 months: Single Family Home Expected Discharge Date: 03/20/19               DME Arranged: N/A DME Agency: NA       HH Arranged: PT HH Agency: [facility] ([facility] [employer]) Date [place]: 03/20/19 Time [place]: [place] Representative spoke with at [place]: Alwyn Ren  Prior Living Arrangements/Services Living arrangements for the past 2 months: [place] Lives with:: Spouse Patient language and need for interpreter reviewed:: Yes Do you feel safe going back to the place where you live?: Yes      Need for Family Participation in Patient Care: Yes (Comment) Care giver support system in place?: Yes (comment)   Criminal Activity/Legal Involvement Pertinent to Current Situation/Hospitalization: No - Comment as needed  Activities of Daily Living Home Assistive Devices/Equipment: Walker (specify type), Cane (specify quad or straight), Crutches, Blood pressure cuff, Eyeglasses,  CPAP, Shower chair without back, Hand-held shower hose, Raised toilet seat with rails ADL Screening (condition at time of admission) Patient's cognitive ability adequate to safely complete daily activities?: Yes Is the patient deaf or have difficulty hearing?: No Does the patient have difficulty seeing, even when wearing glasses/contacts?: No Does the patient have difficulty concentrating, remembering, or making decisions?: No Patient able to express need for assistance with ADLs?: Yes Does the patient have difficulty dressing or bathing?: No Independently performs ADLs?: Yes (appropriate for developmental age) Does the patient have difficulty walking or climbing stairs?: Yes Weakness of Legs: Left Weakness of Arms/Hands: None  Permission Sought/Granted                  Emotional Assessment Appearance:: Appears stated age Attitude/Demeanor/Rapport: Engaged Affect (typically observed): Accepting Orientation: : Oriented to Self, Oriented to Place, Oriented to  Time, Oriented to Situation   Psych Involvement: No (comment)  Admission diagnosis:  OSTEOARTHRITIS LEFT HIP Patient Active Problem List   Diagnosis Date Noted  . 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 Unilateral primary osteoarthritis, left hip 02/10/2019  . Greater trochanteric bursitis of left hip 07/24/2018  . Arthritis of left hip 06/25/2018  . Encounter for general adult medical examination with abnormal findings 01/04/2018  . Vitamin D deficiency 01/04/2018  . Lower back pain 07/18/2017  . Hemorrhoids, internal 07/18/2017  . Trigeminal neuralgia 01/18/2016  . Bipolar disorder ([place]) 11/28/2015  . Borderline personality disorder ([place]) 11/28/2015  . Carpal tunnel syndrome 09/06/2015  . MDD (major depressive disorder)  06/06/2015  . Severe episode of recurrent major depressive disorder, without psychotic features ([place])   . History of posttraumatic stress disorder (PTSD)   . GAD (generalized  anxiety disorder)   . Insulin resistance 02/09/2015  . 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 Acanthosis nigricans, acquired 02/09/2015  . Nonalcoholic fatty liver disease 02/09/2015  . Hepatic cirrhosis ([place]) 02/09/2015  . Female hirsutism 02/09/2015  . Oligomenorrhea 02/09/2015  . Infertility associated with anovulation 02/09/2015  . Morbid obesity ([place]) 10/31/2014  . Anxiety and depression 10/31/2014  . Obstructive sleep apnea 06/27/2014  . Diabetes mellitus, type 2 ([place]) 06/10/2013  . 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 Cystic teratoma   . PCOS (polycystic ovarian syndrome)   . Infertility, female   . TOBACCO USE, QUIT 02/08/2010  . HYPOTHYROIDISM, POSTSURGICAL 10/02/2007  . POLYCYSTIC OVARIAN DISEASE 10/02/2007  . Elevated cholesterol 10/02/2007  . DEPRESSION 10/02/2007  . Migraine 10/02/2007  . Essential hypertension 10/02/2007  . RENAL CALCULUS, HX OF 10/02/2007   PCP:  Patient, No Pcp Per Pharmacy:   [place] [place] [address] [place] [address] Phone: [phone] Fax: [phone]  [id number] - [place], [address] [place] [address] Phone: [phone] Fax: [phone]  [facility] [place], [place] [address] Phone: [phone] Fax: [phone]     Social Determinants of Health (SDOH) Interventions    Readmission Risk Interventions No flowsheet data found.

## 2019-03-20 NOTE — Progress Notes (Signed)
Physical Therapy Treatment Patient Details Name: Tracy [name] MRN: [id number] DOB: [birth year] Today's Date: 03/20/2019    History of Present Illness Pt s/p L THR and with hx of bipolar, lumbar fusion, and trigeminal neuralgia    PT Comments    Pt with marked improvement in pain control since change in MEDS.  Follow Up Recommendations  Home health PT;Follow surgeon's recommendation for DC plan and follow-up therapies     Equipment Recommendations  None recommended by PT    Recommendations for Other Services       Precautions / Restrictions Precautions Precautions: Fall Restrictions Weight Bearing Restrictions: No LLE Weight Bearing: Weight bearing as tolerated    Mobility  Bed Mobility Overal bed mobility: Needs Assistance Bed Mobility: Supine to Sit;Sit to Supine     Supine to sit: Min assist Sit to supine: Min assist   General bed mobility comments: cues for sequence and use of R LE to self assist.  Pt utilizing bedrail and requiring assist to manage L LE  Transfers Overall transfer level: Needs assistance Equipment used: Rolling walker (2 wheeled) Transfers: Sit to/from Stand Sit to Stand: Min guard;From elevated surface         General transfer comment: cues for LE management and use of UEs to self assist  Ambulation/Gait Ambulation/Gait assistance: Min assist;Min guard Gait Distance (Feet): 32 Feet Assistive device: Rolling walker (2 wheeled) Gait Pattern/deviations: Step-to pattern;Decreased step length - right;Decreased step length - left;Shuffle;Trunk flexed Gait velocity: decr   General Gait Details: cues for sequence, posture, position from RW and stride length   Stairs             Wheelchair Mobility    Modified Rankin (Stroke Patients Only)       Balance Overall balance assessment: Mild deficits observed, not formally tested                                          Cognition Arousal/Alertness:  Awake/alert Behavior During Therapy: WFL for tasks assessed/performed Overall Cognitive Status: Within Functional Limits for tasks assessed                                        Exercises Total Joint Exercises Ankle Circles/Pumps: AROM;Both;20 reps;Supine Quad Sets: AROM;Both;10 reps;Supine Heel Slides: AAROM;Left;15 reps;Supine Hip ABduction/ADduction: AAROM;Left;15 reps;Supine    General Comments        Pertinent Vitals/Pain Pain Assessment: 0-10 Pain Score: 5  Pain Location: L hip Pain Descriptors / Indicators: Aching;Burning;Tightness Pain Intervention(s): Limited activity within patient's tolerance;Monitored during session;Premedicated before session;Ice applied    Home Living                      Prior Function            PT Goals (current goals can now be found in the care plan section) Acute Rehab PT Goals Patient Stated Goal: Regain IND PT Goal Formulation: With patient Time For Goal Achievement: 03/27/19 Potential to Achieve Goals: Good Progress towards PT goals: Progressing toward goals    Frequency    7X/week      PT Plan Current plan remains appropriate    Co-evaluation              AM-PAC PT "6 Clicks" Mobility   Outcome  Measure  Help needed turning from your back to your side while in a flat bed without using bedrails?: A Lot Help needed moving from lying on your back to sitting on the side of a flat bed without using bedrails?: A Little Help needed moving to and from a bed to a chair (including a wheelchair)?: A Little Help needed standing up from a chair using your arms (e.g., wheelchair or bedside chair)?: A Little Help needed to walk in hospital room?: A Little Help needed climbing 3-5 steps with a railing? : A Lot 6 Click Score: 16    End of Session Equipment Utilized During Treatment: Gait belt Activity Tolerance: Patient tolerated treatment well Patient left: in bed;with call bell/phone within  reach;with family/visitor present Nurse Communication: Mobility status PT Visit Diagnosis: Difficulty in walking, not elsewhere classified (R26.2);Pain Pain - Right/Left: Left Pain - part of body: Hip     Time: 2836-6294 PT Time Calculation (min) (ACUTE ONLY): 29 min  Charges:  $Gait Training: 8-22 mins $Therapeutic Exercise: 8-22 mins                     [place] Pager [phone] Office [phone]    [facility] 03/20/2019, 5:36 PM

## 2019-03-20 NOTE — Discharge Instructions (Signed)

## 2019-03-21 MED ORDER — KETOROLAC TROMETHAMINE 15 MG/ML IJ SOLN
15.0000 mg | Freq: Once | INTRAMUSCULAR | Status: AC
  Administered 2019-03-21: 15 mg via INTRAVENOUS
  Filled 2019-03-21: qty 1

## 2019-03-21 NOTE — Progress Notes (Signed)
Physical Therapy Treatment Patient Details Name: Tracy [name] MRN: [id number] DOB: 05/03/1966 Today's Date: 03/21/2019    History of Present Illness Pt s/p L THR and with hx of bipolar, lumbar fusion, and trigeminal neuralgia    PT Comments    Pt continues to progress well with mobility and eager for dc home tomorrow.  Follow Up Recommendations  Home health PT;Follow surgeon's recommendation for DC plan and follow-up therapies     Equipment Recommendations  3in1 (PT)(Pt is large)    Recommendations for Other Services       Precautions / Restrictions Precautions Precautions: Fall Restrictions Weight Bearing Restrictions: No LLE Weight Bearing: Weight bearing as tolerated    Mobility  Bed Mobility               General bed mobility comments: pt up in chair and requests back to same  Transfers Overall transfer level: Needs assistance Equipment used: Rolling walker (2 wheeled) Transfers: Sit to/from Stand Sit to Stand: Min guard         General transfer comment: pt self-cues for LE management and use of UEs to self assist  Ambulation/Gait Ambulation/Gait assistance: Min guard Gait Distance (Feet): 300 Feet Assistive device: Rolling walker (2 wheeled) Gait Pattern/deviations: Decreased step length - right;Decreased step length - left;Shuffle;Trunk flexed;Step-to pattern;Step-through pattern Gait velocity: decr   General Gait Details: cues for sequence, posture, position from RW and stride length   Stairs Stairs: Yes Stairs assistance: Min assist Stair Management: One rail Right;Step to pattern;Forwards;With crutches Number of Stairs: 5 General stair comments: cues for sequence and foot/crutch placement   Wheelchair Mobility    Modified Rankin (Stroke Patients Only)       Balance Overall balance assessment: Mild deficits observed, not formally tested                                          Cognition  Arousal/Alertness: Awake/alert Behavior During Therapy: WFL for tasks assessed/performed Overall Cognitive Status: Within Functional Limits for tasks assessed                                        Exercises Total Joint Exercises Ankle Circles/Pumps: AROM;Both;20 reps;Supine Quad Sets: AROM;Both;10 reps;Supine Heel Slides: AAROM;Left;Supine;20 reps Hip ABduction/ADduction: AAROM;Left;Supine;20 reps    General Comments        Pertinent Vitals/Pain Pain Assessment: 0-10 Pain Score: 5  Pain Location: L hip/thigh Pain Descriptors / Indicators: Aching;Burning;Tightness Pain Intervention(s): Limited activity within patient's tolerance;Monitored during session;Premedicated before session;Ice applied    Home Living                      Prior Function            PT Goals (current goals can now be found in the care plan section) Acute Rehab PT Goals Patient Stated Goal: Regain IND PT Goal Formulation: With patient Time For Goal Achievement: 03/27/19 Potential to Achieve Goals: Good Progress towards PT goals: Progressing toward goals    Frequency    7X/week      PT Plan Current plan remains appropriate    Co-evaluation              AM-PAC PT "6 Clicks" Mobility   Outcome Measure  Help needed turning from your back to  your side while in a flat bed without using bedrails?: A Lot Help needed moving from lying on your back to sitting on the side of a flat bed without using bedrails?: A Little Help needed moving to and from a bed to a chair (including a wheelchair)?: A Little Help needed standing up from a chair using your arms (e.g., wheelchair or bedside chair)?: A Little Help needed to walk in hospital room?: A Little Help needed climbing 3-5 steps with a railing? : A Little 6 Click Score: 17    End of Session Equipment Utilized During Treatment: Gait belt Activity Tolerance: Patient tolerated treatment well;Patient limited by  pain Patient left: in chair;with call bell/phone within reach;with chair alarm set Nurse Communication: Mobility status PT Visit Diagnosis: Difficulty in walking, not elsewhere classified (R26.2);Pain Pain - Right/Left: Left Pain - part of body: Hip     Time: 1440-1520 PT Time Calculation (min) (ACUTE ONLY): 40 min  Charges:  $Gait Training: 8-22 mins $Therapeutic Exercise: 8-22 mins $Therapeutic Activity: 8-22 mins                     Debe Coder PT Acute Rehabilitation Services Pager [phone] Office [phone]    Romaine Neville 03/21/2019, 5:03 PM

## 2019-03-21 NOTE — Plan of Care (Signed)
  Problem: Clinical Measurements: Goal: Respiratory complications will improve Outcome: Progressing   Problem: Clinical Measurements: Goal: Cardiovascular complication will be avoided Outcome: Progressing   Problem: Skin Integrity: Goal: Risk for impaired skin integrity will decrease Outcome: Progressing   Problem: Safety: Goal: Ability to remain free from injury will improve Outcome: Progressing   

## 2019-03-21 NOTE — TOC Progression Note (Signed)
Transition of Care [facility]) - Progression Note    Patient Details  Name: Natascha Edmonds MRN: [id number] Date of Birth: 05-Jun-1966  [facility] of [facility] CM/SW Contact  Joaquin Courts, RN Phone Number: 03/21/2019, 1:27 PM  Clinical Narrative:   [employer] arranged to deliver 3-in-1 to bedside.     Expected Discharge Plan: [place] Barriers to Discharge: Continued Medical Work up  Expected Discharge Plan and Services Expected Discharge Plan: [place]   Discharge Planning Services: CM Consult Post Acute Care Choice: [place] arrangements for the past 2 months: Single Family Home Expected Discharge Date: 03/20/19               DME Arranged: 3-N-1 DME Agency: [employer] Date DME Agency Contacted: 03/21/19 Time DME Agency Contacted: [phone] Representative spoke with at DME Agency: [place]: PT [place]: [facility] [employer] Date [place]: 03/20/19 Time [place]: [place] Representative spoke with at [place]: [place] ([place]) Interventions    Readmission Risk Interventions No flowsheet data found.

## 2019-03-21 NOTE — Plan of Care (Signed)
  Problem: Education: Goal: Knowledge of General Education information will improve Description: Including pain rating scale, medication(s)/side effects and non-pharmacologic comfort measures Outcome: Progressing   Problem: Health Behavior/Discharge Planning: Goal: Ability to manage health-related needs will improve Outcome: Progressing   Problem: Clinical Measurements: Goal: Ability to maintain clinical measurements within normal limits will improve Outcome: Progressing   Problem: Elimination: Goal: Will not experience complications related to bowel motility Outcome: Progressing   Problem: Elimination: Goal: Will not experience complications related to urinary retention Outcome: Progressing

## 2019-03-21 NOTE — Progress Notes (Signed)
Physical Therapy Treatment Patient Details Name: Tracy [name] MRN: [id number] DOB: May 01, 1966 Today's Date: 03/21/2019    History of Present Illness Pt s/p L THR and with hx of bipolar, lumbar fusion, and trigeminal neuralgia    PT Comments    Pt with increased pain from yesterday but with encouragement willing to attempt ambulation.   Follow Up Recommendations  Home health PT;Follow surgeon's recommendation for DC plan and follow-up therapies     Equipment Recommendations  None recommended by PT    Recommendations for Other Services       Precautions / Restrictions Precautions Precautions: Fall Restrictions Weight Bearing Restrictions: No LLE Weight Bearing: Weight bearing as tolerated    Mobility  Bed Mobility Overal bed mobility: Needs Assistance Bed Mobility: Supine to Sit     Supine to sit: Min assist     General bed mobility comments: cues for sequence and use of R LE to self assist.  Pt utilizing bedrail and requiring assist to manage L LE  Transfers Overall transfer level: Needs assistance Equipment used: Rolling walker (2 wheeled) Transfers: Sit to/from Stand Sit to Stand: Min guard;From elevated surface         General transfer comment: cues for LE management and use of UEs to self assist  Ambulation/Gait Ambulation/Gait assistance: Min assist;Min guard Gait Distance (Feet): 200 Feet(and 15' into bathroom) Assistive device: Rolling walker (2 wheeled) Gait Pattern/deviations: Decreased step length - right;Decreased step length - left;Shuffle;Trunk flexed;Step-to pattern;Step-through pattern Gait velocity: decr   General Gait Details: cues for sequence, posture, position from RW and stride length   Stairs             Wheelchair Mobility    Modified Rankin (Stroke Patients Only)       Balance Overall balance assessment: Mild deficits observed, not formally tested                                           Cognition Arousal/Alertness: Awake/alert Behavior During Therapy: WFL for tasks assessed/performed Overall Cognitive Status: Within Functional Limits for tasks assessed                                        Exercises      General Comments        Pertinent Vitals/Pain Pain Assessment: 0-10 Pain Score: 7  Pain Location: L hip/thigh Pain Descriptors / Indicators: Aching;Burning;Tightness Pain Intervention(s): Limited activity within patient's tolerance;Monitored during session;Premedicated before session;Ice applied    Home Living                      Prior Function            PT Goals (current goals can now be found in the care plan section) Acute Rehab PT Goals Patient Stated Goal: Regain IND PT Goal Formulation: With patient Time For Goal Achievement: 03/27/19 Potential to Achieve Goals: Good Progress towards PT goals: Progressing toward goals    Frequency    7X/week      PT Plan Current plan remains appropriate    Co-evaluation              AM-PAC PT "6 Clicks" Mobility   Outcome Measure  Help needed turning from your back to your side while in a flat  bed without using bedrails?: A Lot Help needed moving from lying on your back to sitting on the side of a flat bed without using bedrails?: A Little Help needed moving to and from a bed to a chair (including a wheelchair)?: A Little Help needed standing up from a chair using your arms (e.g., wheelchair or bedside chair)?: A Little Help needed to walk in hospital room?: A Little Help needed climbing 3-5 steps with a railing? : A Lot 6 Click Score: 16    End of Session Equipment Utilized During Treatment: Gait belt Activity Tolerance: Patient tolerated treatment well;Patient limited by pain Patient left: in chair;with call bell/phone within reach;with chair alarm set Nurse Communication: Mobility status PT Visit Diagnosis: Difficulty in walking, not elsewhere classified  (R26.2);Pain Pain - Right/Left: Left Pain - part of body: Hip     Time: 1200-1223 PT Time Calculation (min) (ACUTE ONLY): 23 min  Charges:  $Gait Training: 8-22 mins $Therapeutic Activity: 8-22 mins                     [place] Pager [phone] Office [phone]    Arno Cullers 03/21/2019, 1:36 PM

## 2019-03-21 NOTE — Progress Notes (Signed)
Patient ID: Tracy [name], female   DOB: October 02, 1965, 53 y.o.   MRN: [id number] Patient is still having significant pain requiring IV pain medication.  She was resting comfortably this morning.  She states the Flexeril makes her sleepy and discussed that she could try using this for oral pain medication.  Plan for discharge Monday.

## 2019-03-22 ENCOUNTER — Encounter (HOSPITAL_COMMUNITY): Payer: Self-pay | Admitting: Orthopaedic Surgery

## 2019-03-22 NOTE — Plan of Care (Signed)
Patient ready for discharge. 

## 2019-03-22 NOTE — Discharge Summary (Signed)
Patient ID: Tracy [name] MRN: [id number] DOB/AGE: 09/19/1965 53 y.o.  Admit date: 03/19/2019 Discharge date: 03/22/2019  Admission Diagnoses:  Principal Problem:   Unilateral primary osteoarthritis, left hip Active Problems:   Status post total replacement of left hip   Discharge Diagnoses:  Same  Past Medical History:  Diagnosis Date  . Anxiety   . Arthritis    back, knees, right elbow  . Bipolar disorder ([place])   . Cirrhosis ([place])   . Dental crowns present   . Depression   . Diabetes mellitus without complication ([place])   . GERD (gastroesophageal reflux disease)   . High cholesterol    no current med.  Marland Kitchen History of kidney stones   . History of migraine headaches   . Hypertension    under control with meds., has been on med. x 4 yr.  . Hypothyroidism   . NASH (nonalcoholic steatohepatitis)   . PCOS (polycystic ovarian syndrome)   . PONV (postoperative nausea and vomiting)    also hx. of emergence delirium 2007  . Sleep apnea    uses CPAP nightly  . Trigeminal neuralgia     Surgeries: Procedure(s): LEFT TOTAL HIP ARTHROPLASTY ANTERIOR APPROACH on 03/19/2019   Consultants:   Discharged Condition: Improved  Hospital Course: Lallie Strahm is an 53 y.o. female who was admitted 03/19/2019 for operative treatment ofUnilateral primary osteoarthritis, left hip. Patient has severe unremitting pain that affects sleep, daily activities, and work/hobbies. After pre-op clearance the patient was taken to the operating room on 03/19/2019 and underwent  Procedure(s): LEFT TOTAL HIP ARTHROPLASTY ANTERIOR APPROACH.    Patient was given perioperative antibiotics:  Anti-infectives (From admission, onward)   Start     Dose/Rate Route Frequency Ordered Stop   03/20/19 1200  doxycycline (VIBRA-TABS) tablet 100 mg     100 mg Oral Every 12 hours 03/20/19 1055     03/20/19 0000  doxycycline (VIBRA-TABS) 100 MG tablet     100 mg Oral Every 12 hours 03/20/19 1059      03/19/19 2000  ceFAZolin (ANCEF) IVPB 1 g/50 mL premix     1 g 100 mL/hr over 30 Minutes Intravenous Every 6 hours 03/19/19 1908 03/20/19 0155   03/19/19 1015  ceFAZolin (ANCEF) IVPB 2g/100 mL premix     2 g 200 mL/hr over 30 Minutes Intravenous On call to O.R. 03/19/19 1013 03/19/19 1341       Patient was given sequential compression devices, early ambulation, and chemoprophylaxis to prevent DVT.  Patient benefited maximally from hospital stay and there were no complications.    Recent vital signs:  Patient Vitals for the past 24 hrs:  BP Temp Temp src Pulse Resp SpO2  03/22/19 0545 108/65 98 F (36.7 C) Oral 75 14 100 %  03/21/19 2125 116/60 99.6 F (37.6 C) Oral 84 18 99 %     Recent laboratory studies:  Recent Labs    03/20/19 0300  WBC 7.3  HGB 11.3*  HCT 35.9*  PLT 169  NA 139  K 4.2  CL 108  CO2 27  BUN 8  CREATININE 0.69  GLUCOSE 111*  CALCIUM 8.7*     Discharge Medications:   Allergies as of 03/22/2019      Reactions   Sulfa Antibiotics Itching, Swelling, Rash   Bupropion Hcl Itching, Rash   Codeine Itching   Sulfamethoxazole-trimethoprim Rash      Medication List    TAKE these medications   aspirin 81 MG chewable tablet Chew 1  tablet (81 mg total) by mouth 2 (two) times daily.   clonazePAM 1 MG tablet Commonly known as: KLONOPIN Take 1 mg by mouth 3 (three) times daily.   cyclobenzaprine 10 MG tablet Commonly known as: FLEXERIL Take 1 tablet (10 mg total) by mouth 3 (three) times daily as needed for muscle spasms.   doxycycline 100 MG tablet Commonly known as: VIBRA-TABS Take 1 tablet (100 mg total) by mouth every 12 (twelve) hours.   FLUoxetine 40 MG capsule Commonly known as: PROZAC Take 40 mg by mouth every morning.   fluticasone 50 MCG/ACT nasal spray Commonly known as: FLONASE Place 1 spray into both nostrils daily as needed for allergies or rhinitis.   lamoTRIgine 200 MG tablet Commonly known as: LAMICTAL Take 400 mg by  mouth every evening.   lisinopril-hydrochlorothiazide 10-12.5 MG tablet Commonly known as: ZESTORETIC TAKE 1 TABLET BY MOUTH DAILY. **NEED VISIT** **NO FURTHER REFILLS UNTIL SEEN** What changed: See the new instructions.   loratadine 10 MG tablet Commonly known as: CLARITIN Take 10 mg by mouth daily as needed for allergies.   meloxicam 15 MG tablet Commonly known as: MOBIC TAKE 1 TABLET BY MOUTH ONCE DAILY   ondansetron 4 MG disintegrating tablet Commonly known as: ZOFRAN-ODT Take 4 mg by mouth every 4 (four) hours as needed for nausea or vomiting.   oxyCODONE 5 MG immediate release tablet Commonly known as: Oxy IR/ROXICODONE Take 1-2 tablets (5-10 mg total) by mouth every 4 (four) hours as needed for moderate pain (pain score 4-6).   RABEprazole 20 MG tablet Commonly known as: Aciphex Take one tablet, daily. What changed:   how much to take  how to take this  when to take this  additional instructions   rizatriptan 10 MG tablet Commonly known as: Maxalt Take 1 tablet (10 mg total) by mouth as needed for migraine. May repeat in 2 hours if needed   Synthroid 200 MCG tablet Generic drug: levothyroxine Take 300 mcg 3 days a week and 200 mcg 4 days a week. What changed:   how much to take  how to take this  when to take this  additional instructions   Tylenol 325 MG tablet Generic drug: acetaminophen Take 650 mg by mouth every 6 (six) hours as needed.   TYLENOL 500 MG tablet Generic drug: acetaminophen Take 500 mg by mouth every 6 (six) hours as needed. Took yesterday for pain 2 tabs            Durable Medical Equipment  (From admission, onward)         Start     Ordered   03/19/19 1908  DME 3 n 1  Once     03/19/19 1908   03/19/19 1908  DME Walker rolling  Once    Question:  Patient needs a walker to treat with the following condition  Answer:  Status post total replacement of left hip   03/19/19 1908          Diagnostic Studies: Dg  Pelvis Portable  Result Date: 03/19/2019 CLINICAL DATA:  LEFT hip replacement. EXAM: PORTABLE PELVIS 1-2 VIEWS COMPARISON:  None. FINDINGS: Status post LEFT hip arthroplasty. Hardware appears intact and appropriately positioned. Osseous alignment is anatomic. Expected postsurgical changes within the overlying soft tissues. IMPRESSION: Status post LEFT hip arthroplasty. Hardware appears intact and appropriately positioned. No evidence of surgical complicating feature. Electronically Signed   By: Franki Cabot M.D.   On: 03/19/2019 16:29   Dg C-arm 1-60 Min-no Report  Result Date: 03/19/2019 Fluoroscopy was utilized by the requesting physician.  No radiographic interpretation.   Dg Hip Operative Unilat W Or W/o Pelvis Left  Result Date: 03/19/2019 CLINICAL DATA:  Intra op right side anterior approach total hip replacement. Hx of OA. EXAM: OPERATIVE LEFT HIP (WITH PELVIS IF PERFORMED) 2 VIEWS TECHNIQUE: Fluoroscopic spot image(s) were submitted for interpretation post-operatively. COMPARISON:  None. FINDINGS: Intraoperative fluoroscopic images demonstrate a LEFT hip arthroplasty. Hardware appears intact and appropriately positioned. Osseous alignment is anatomic. Fluoroscopy provided for 30 seconds. IMPRESSION: Intraoperative fluoroscopic images demonstrating a LEFT hip arthroplasty. Hardware appears intact and appropriately positioned. No evidence of surgical complicating feature. Electronically Signed   By: Franki Cabot M.D.   On: 03/19/2019 15:11    Disposition: Discharge disposition: 01-Home or Self Care         Follow-up Information    Mcarthur Rossetti, MD. Schedule an appointment as soon as possible for a visit in 2 week(s).   Specialty: Orthopedic Surgery Contact information: [place] [address] [phone]        Home, [facility] up.   Specialty: Home Health Services Why: agency will provide home health physical therapy, agency will  call you to schedule first visit. Contact information: [address] [place] [address] [phone]            Signed: Mcarthur Rossetti 03/22/2019, 7:26 AM

## 2019-03-22 NOTE — Progress Notes (Signed)
Patient ID: Tracy [name], female   DOB: 05-Aug-1966, 53 y.o.   MRN: [id number] No acute changes.  Mobility improving.  Left hip stable. Vitals stable.  Can be discharged to home today.

## 2019-03-22 NOTE — Progress Notes (Signed)
Physical Therapy Treatment Patient Details Name: Tracy [name] MRN: [id number] DOB: [birth year] Today's Date: 03/22/2019    History of Present Illness Pt s/p L THR and with hx of bipolar, lumbar fusion, and trigeminal neuralgia    PT Comments    POD # 3 am session Assisted with amb and HEP.    Follow Up Recommendations  Home health PT;Follow surgeon's recommendation for DC plan and follow-up therapies     Equipment Recommendations  3in1 (PT)    Recommendations for Other Services       Precautions / Restrictions Precautions Precautions: Fall Restrictions Weight Bearing Restrictions: No LLE Weight Bearing: Weight bearing as tolerated    Mobility  Bed Mobility Overal bed mobility: Needs Assistance Bed Mobility: Supine to Sit     Supine to sit: Supervision;Min guard     General bed mobility comments: demonstarted and instructed how to use a belt loop to self assist  Transfers Overall transfer level: Needs assistance Equipment used: Rolling walker (2 wheeled) Transfers: Sit to/from Stand Sit to Stand: Supervision         General transfer comment: good safety cognition and use of hands  Ambulation/Gait Ambulation/Gait assistance: Supervision;Min guard Gait Distance (Feet): 45 Feet Assistive device: Rolling walker (2 wheeled) Gait Pattern/deviations: Decreased step length - right;Decreased step length - left;Shuffle;Trunk flexed;Step-to pattern;Step-through pattern     General Gait Details: <25% cues for sequence, posture, position from RW and stride length   Stairs             Wheelchair Mobility    Modified Rankin (Stroke Patients Only)       Balance                                            Cognition Arousal/Alertness: Awake/alert Behavior During Therapy: WFL for tasks assessed/performed Overall Cognitive Status: Within Functional Limits for tasks assessed                                  General Comments: eager to get home      Exercises   Total Hip Replacement TE's 10 reps ankle pumps 10 reps knee presses 10 reps heel slides 10 reps SAQ's 10 reps ABD Followed by ICE     General Comments        Pertinent Vitals/Pain Pain Assessment: 0-10 Pain Score: 5  Pain Location: L hip/thigh Pain Descriptors / Indicators: Aching;Burning;Tightness Pain Intervention(s): Monitored during session;Ice applied    Home Living                      Prior Function            PT Goals (current goals can now be found in the care plan section) Progress towards PT goals: Progressing toward goals    Frequency           PT Plan Current plan remains appropriate    Co-evaluation              AM-PAC PT "6 Clicks" Mobility   Outcome Measure                   End of Session Equipment Utilized During Treatment: Gait belt Activity Tolerance: Patient tolerated treatment well;Patient limited by pain Patient left: in chair;with call bell/phone within reach;with chair  alarm set Nurse Communication: Mobility status PT Visit Diagnosis: Difficulty in walking, not elsewhere classified (R26.2);Pain Pain - Right/Left: Left Pain - part of body: Hip     Time: 9373-4287 PT Time Calculation (min) (ACUTE ONLY): 33 min  Charges:  $Gait Training: 8-22 mins $Therapeutic Exercise: 8-22 mins                     Rica Koyanagi  PTA Acute  Rehabilitation Services Pager      [phone] Office      [phone]

## 2019-03-22 NOTE — Progress Notes (Signed)
Patient has been given discharge packet and this RN reviewed discharge information with the patient. Patient waiting for her husband to pick her up.

## 2019-03-23 DIAGNOSIS — I10 Essential (primary) hypertension: Secondary | ICD-10-CM | POA: Diagnosis not present

## 2019-03-23 DIAGNOSIS — M170 Bilateral primary osteoarthritis of knee: Secondary | ICD-10-CM | POA: Diagnosis not present

## 2019-03-23 DIAGNOSIS — E119 Type 2 diabetes mellitus without complications: Secondary | ICD-10-CM | POA: Diagnosis not present

## 2019-03-23 DIAGNOSIS — F319 Bipolar disorder, unspecified: Secondary | ICD-10-CM | POA: Diagnosis not present

## 2019-03-23 DIAGNOSIS — K7581 Nonalcoholic steatohepatitis (NASH): Secondary | ICD-10-CM | POA: Diagnosis not present

## 2019-03-23 DIAGNOSIS — K7460 Unspecified cirrhosis of liver: Secondary | ICD-10-CM | POA: Diagnosis not present

## 2019-03-23 DIAGNOSIS — M479 Spondylosis, unspecified: Secondary | ICD-10-CM | POA: Diagnosis not present

## 2019-03-23 DIAGNOSIS — F3181 Bipolar II disorder: Secondary | ICD-10-CM | POA: Diagnosis not present

## 2019-03-23 DIAGNOSIS — M19021 Primary osteoarthritis, right elbow: Secondary | ICD-10-CM | POA: Diagnosis not present

## 2019-03-23 DIAGNOSIS — Z471 Aftercare following joint replacement surgery: Secondary | ICD-10-CM | POA: Diagnosis not present

## 2019-03-24 ENCOUNTER — Other Ambulatory Visit: Payer: Self-pay | Admitting: *Deleted

## 2019-03-24 ENCOUNTER — Encounter: Payer: Self-pay | Admitting: *Deleted

## 2019-03-24 NOTE — Patient Outreach (Addendum)
[facility]) Care Management  03/24/2019  [name] [birth year] [id number]  Transition of care telephone call/case closure  Referral received: 03/17/19 Initial outreach: 03/18/19 for pre-op call, 8/19 for post op call Insurance: [facility] Choice Plan  Subjective: Initial successful telephone call to patient's preferred (mobile) number in order to complete transition of care assessment; 2 HIPAA identifiers verified. Explained purpose of call and completed transition of care assessment.  Tracy [name] states she is doing well, denies post-operative problems, says surgical incisions are unremarkable, states surgical pain well managed with prescribed medications and she is not requiring the Flexeril, tolerating diet, denies bowel or bladder problems.   Spouse is assisting with his/her recovery.  She says she does have the hospital indemnity and knows how to file the claim. She says she uses a [facility] outpatient pharmacy.  She denies educational needs related to staying safe during the COVID 19 pandemic.    Objective: Per the electronic medical record, Tracy [name] had left total hip arthroplasty on 03/19/19 at [facility]. 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  Plan: Reviewed hospital discharge diagnosis of left total hip arthroplasty and treatment plan using hospital discharge instructions, assessing medication adherence, reviewing problems requiring provider notification, and discussing the importance of  follow up with surgeon as directed. Reviewed [place]'s announcements that all [place] members will receive the Healthy Lifestyle Premium rate in 2021 and that the Health Rewards Physical Activity Program has been temporarily suspended. No ongoing care management needs identified so will close case to [facility] Management services and route successful outreach letter with [place] [facility] Management pamphlet and 24 Hour Nurse Line Magnet to [place] Management clinical pool to be mailed to patient's home address.   Barrington Ellison RN,CCM,CDE [place] Management Coordinator Office Phone [phone] Office Fax [phone]

## 2019-03-25 ENCOUNTER — Telehealth (INDEPENDENT_AMBULATORY_CARE_PROVIDER_SITE_OTHER): Payer: Self-pay | Admitting: "Endocrinology

## 2019-03-25 DIAGNOSIS — K7581 Nonalcoholic steatohepatitis (NASH): Secondary | ICD-10-CM | POA: Diagnosis not present

## 2019-03-25 DIAGNOSIS — F319 Bipolar disorder, unspecified: Secondary | ICD-10-CM | POA: Diagnosis not present

## 2019-03-25 DIAGNOSIS — K7460 Unspecified cirrhosis of liver: Secondary | ICD-10-CM | POA: Diagnosis not present

## 2019-03-25 DIAGNOSIS — M170 Bilateral primary osteoarthritis of knee: Secondary | ICD-10-CM | POA: Diagnosis not present

## 2019-03-25 DIAGNOSIS — M19021 Primary osteoarthritis, right elbow: Secondary | ICD-10-CM | POA: Diagnosis not present

## 2019-03-25 DIAGNOSIS — Z471 Aftercare following joint replacement surgery: Secondary | ICD-10-CM | POA: Diagnosis not present

## 2019-03-25 DIAGNOSIS — M479 Spondylosis, unspecified: Secondary | ICD-10-CM | POA: Diagnosis not present

## 2019-03-25 DIAGNOSIS — I10 Essential (primary) hypertension: Secondary | ICD-10-CM | POA: Diagnosis not present

## 2019-03-25 DIAGNOSIS — E119 Type 2 diabetes mellitus without complications: Secondary | ICD-10-CM | POA: Diagnosis not present

## 2019-03-25 NOTE — Telephone Encounter (Signed)
°  Who's calling (name and relationship to patient) : Roderic Palau ([place])  Best contact number: [phone] Provider they see: Dr. Tobe Sos Reason for call: Roderic Palau will be faxing over a supply order for CPAP for pt.  (F) [phone]

## 2019-03-26 ENCOUNTER — Telehealth: Payer: Self-pay | Admitting: Orthopaedic Surgery

## 2019-03-26 DIAGNOSIS — M170 Bilateral primary osteoarthritis of knee: Secondary | ICD-10-CM | POA: Diagnosis not present

## 2019-03-26 DIAGNOSIS — F319 Bipolar disorder, unspecified: Secondary | ICD-10-CM | POA: Diagnosis not present

## 2019-03-26 DIAGNOSIS — M479 Spondylosis, unspecified: Secondary | ICD-10-CM | POA: Diagnosis not present

## 2019-03-26 DIAGNOSIS — K7581 Nonalcoholic steatohepatitis (NASH): Secondary | ICD-10-CM | POA: Diagnosis not present

## 2019-03-26 DIAGNOSIS — E119 Type 2 diabetes mellitus without complications: Secondary | ICD-10-CM | POA: Diagnosis not present

## 2019-03-26 DIAGNOSIS — Z471 Aftercare following joint replacement surgery: Secondary | ICD-10-CM | POA: Diagnosis not present

## 2019-03-26 DIAGNOSIS — M19021 Primary osteoarthritis, right elbow: Secondary | ICD-10-CM | POA: Diagnosis not present

## 2019-03-26 DIAGNOSIS — I10 Essential (primary) hypertension: Secondary | ICD-10-CM | POA: Diagnosis not present

## 2019-03-26 DIAGNOSIS — K7460 Unspecified cirrhosis of liver: Secondary | ICD-10-CM | POA: Diagnosis not present

## 2019-03-26 MED ORDER — TRAMADOL HCL 50 MG PO TABS: 50.0000 mg | ORAL_TABLET | Freq: Four times a day (QID) | ORAL | 0 refills | Status: DC | PRN

## 2019-03-26 NOTE — Telephone Encounter (Signed)
Patient called stating that the Oxycodone makes her feel like a zombie and wants to know if Dr. Ninfa Linden would prescribe her something lighter like Ultram or Tramadol.  Please send the RX to the [facility] in [place].  CB#[phone].  Thank you.

## 2019-03-26 NOTE — Telephone Encounter (Signed)
Please advise 

## 2019-03-29 ENCOUNTER — Ambulatory Visit: Payer: Self-pay | Admitting: *Deleted

## 2019-03-29 ENCOUNTER — Telehealth (INDEPENDENT_AMBULATORY_CARE_PROVIDER_SITE_OTHER): Payer: Self-pay | Admitting: "Endocrinology

## 2019-03-29 ENCOUNTER — Other Ambulatory Visit: Payer: Self-pay | Admitting: Orthopaedic Surgery

## 2019-03-29 ENCOUNTER — Encounter (INDEPENDENT_AMBULATORY_CARE_PROVIDER_SITE_OTHER): Payer: Self-pay | Admitting: *Deleted

## 2019-03-29 DIAGNOSIS — M19021 Primary osteoarthritis, right elbow: Secondary | ICD-10-CM | POA: Diagnosis not present

## 2019-03-29 DIAGNOSIS — Z471 Aftercare following joint replacement surgery: Secondary | ICD-10-CM | POA: Diagnosis not present

## 2019-03-29 DIAGNOSIS — M170 Bilateral primary osteoarthritis of knee: Secondary | ICD-10-CM | POA: Diagnosis not present

## 2019-03-29 DIAGNOSIS — K7581 Nonalcoholic steatohepatitis (NASH): Secondary | ICD-10-CM | POA: Diagnosis not present

## 2019-03-29 DIAGNOSIS — K7460 Unspecified cirrhosis of liver: Secondary | ICD-10-CM | POA: Diagnosis not present

## 2019-03-29 DIAGNOSIS — E119 Type 2 diabetes mellitus without complications: Secondary | ICD-10-CM | POA: Diagnosis not present

## 2019-03-29 DIAGNOSIS — H524 Presbyopia: Secondary | ICD-10-CM | POA: Diagnosis not present

## 2019-03-29 DIAGNOSIS — I10 Essential (primary) hypertension: Secondary | ICD-10-CM | POA: Diagnosis not present

## 2019-03-29 DIAGNOSIS — M479 Spondylosis, unspecified: Secondary | ICD-10-CM | POA: Diagnosis not present

## 2019-03-29 DIAGNOSIS — F319 Bipolar disorder, unspecified: Secondary | ICD-10-CM | POA: Diagnosis not present

## 2019-03-29 LAB — HM DIABETES EYE EXAM

## 2019-03-29 NOTE — Telephone Encounter (Signed)
Attempted to call, wait time was recorded to be 30 mins. Mychart message sent to patient explaining she needed to get her CPAP supplies from whomever initially prescribed them. Dr. Tobe Sos does not do those.

## 2019-03-29 NOTE — Telephone Encounter (Signed)
Attempted to call numerous times with wait times over 30 minutes, I have reached out to patient who advises she has made an appt with Dr. Halford Chessman for this.  If this company calls back they need to call patient as Dr. Tobe Sos does not do CPAP supplies.  FYI.

## 2019-03-29 NOTE — Telephone Encounter (Signed)
Refill

## 2019-03-29 NOTE — Telephone Encounter (Signed)
°  Who's calling (name and relationship to patient) : [place] contact number: [phone] Provider they see: Dr Tobe Sos  Reason for call: Rx for Cpap supplies.  It was fax 03/25/19.     PRESCRIPTION REFILL ONLY  Name of prescription:  Pharmacy:

## 2019-03-31 DIAGNOSIS — M170 Bilateral primary osteoarthritis of knee: Secondary | ICD-10-CM | POA: Diagnosis not present

## 2019-03-31 DIAGNOSIS — E119 Type 2 diabetes mellitus without complications: Secondary | ICD-10-CM | POA: Diagnosis not present

## 2019-03-31 DIAGNOSIS — M19021 Primary osteoarthritis, right elbow: Secondary | ICD-10-CM | POA: Diagnosis not present

## 2019-03-31 DIAGNOSIS — K7460 Unspecified cirrhosis of liver: Secondary | ICD-10-CM | POA: Diagnosis not present

## 2019-03-31 DIAGNOSIS — F319 Bipolar disorder, unspecified: Secondary | ICD-10-CM | POA: Diagnosis not present

## 2019-03-31 DIAGNOSIS — Z471 Aftercare following joint replacement surgery: Secondary | ICD-10-CM | POA: Diagnosis not present

## 2019-03-31 DIAGNOSIS — M479 Spondylosis, unspecified: Secondary | ICD-10-CM | POA: Diagnosis not present

## 2019-03-31 DIAGNOSIS — K7581 Nonalcoholic steatohepatitis (NASH): Secondary | ICD-10-CM | POA: Diagnosis not present

## 2019-03-31 DIAGNOSIS — I10 Essential (primary) hypertension: Secondary | ICD-10-CM | POA: Diagnosis not present

## 2019-04-01 ENCOUNTER — Ambulatory Visit (INDEPENDENT_AMBULATORY_CARE_PROVIDER_SITE_OTHER): Payer: 59 | Admitting: Orthopaedic Surgery

## 2019-04-01 ENCOUNTER — Encounter: Payer: Self-pay | Admitting: Orthopaedic Surgery

## 2019-04-01 DIAGNOSIS — Z96642 Presence of left artificial hip joint: Secondary | ICD-10-CM

## 2019-04-01 NOTE — Progress Notes (Signed)
The patient comes in today 2 weeks status post a left total hip arthroplasty.  She is someone who has some significant obesity and has lost a lot of weight over the years.  She still has a pannus that hangs over her incision.  She is very pleased with her surgery.  She is just ambulating with a cane and is all been raised by a home therapy.  She is been on aspirin twice a day as well as Flexeril and tramadol.  She says she does not need any refills.  On examination her leg lengths are equal.  I did remove all the sutures ends there at the top of the incision as well as the staples.  There is a little bit of weeping of the top incision but this is more of her body habitus area which can have difficulty healing up in the groin crease from being wet and macerated.  There is no significant seroma to drain though.  I did place new Steri-Strips.  I counseled her about keeping this area in her groin dry and washing it every day.  She should then dried off for a while and keep a gauze in her body fold right there.  She can always put some triple antibiotic ointment or even Vaseline on a small area today.  We will see her back in a month to see how she is doing overall.  She will go down to once a day aspirin for a week and then can stop her aspirin.  If there is any issues between now 4 weeks she will let us know.

## 2019-04-06 DIAGNOSIS — F3181 Bipolar II disorder: Secondary | ICD-10-CM | POA: Diagnosis not present

## 2019-04-07 DIAGNOSIS — F330 Major depressive disorder, recurrent, mild: Secondary | ICD-10-CM | POA: Diagnosis not present

## 2019-04-13 ENCOUNTER — Other Ambulatory Visit: Payer: Self-pay | Admitting: Orthopaedic Surgery

## 2019-04-13 ENCOUNTER — Encounter: Payer: Self-pay | Admitting: Orthopaedic Surgery

## 2019-04-13 MED ORDER — TRAMADOL HCL 50 MG PO TABS: 50.0000 mg | ORAL_TABLET | Freq: Four times a day (QID) | ORAL | 0 refills | Status: DC | PRN

## 2019-04-16 ENCOUNTER — Encounter: Payer: Self-pay | Admitting: Physician Assistant

## 2019-04-16 ENCOUNTER — Telehealth: Payer: 59 | Admitting: Physician Assistant

## 2019-04-16 ENCOUNTER — Encounter: Payer: Self-pay | Admitting: Orthopaedic Surgery

## 2019-04-16 DIAGNOSIS — L03115 Cellulitis of right lower limb: Secondary | ICD-10-CM

## 2019-04-16 MED ORDER — CEPHALEXIN 500 MG PO CAPS: 500.0000 mg | ORAL_CAPSULE | Freq: Four times a day (QID) | ORAL | 0 refills | Status: DC

## 2019-04-16 NOTE — Progress Notes (Signed)
E Visit for Cellulitis  We are sorry that you are not feeling well. Here is how we plan to help!  Based on what you shared with me it looks like you have cellulitis.  Cellulitis looks like areas of skin redness, swelling, and warmth; it develops as a result of bacteria entering under the skin. Little red spots and/or bleeding can be seen in skin, and tiny surface sacs containing fluid can occur. Fever can be present. Cellulitis is almost always on one side of a body, and the lower limbs are the most common site of involvement.   I have prescribed:  Keflex 500mg orally, 4 times a day for 5 days    HOME CARE:  . Take your medications as ordered and take all of them, even if the skin irritation appears to be healing.   GET HELP RIGHT AWAY IF:  . Symptoms that don't begin to go away within 48 hours. . Severe redness persists or worsens . If the area turns color, spreads or swells. . If it blisters and opens, develops yellow-brown crust or bleeds. . You develop a fever or chills. . If the pain increases or becomes unbearable.  . Are unable to keep fluids and food down.  MAKE SURE YOU    Understand these instructions.  Will watch your condition.  Will get help right away if you are not doing well or get worse.  Thank you for choosing an e-visit. Your e-visit answers were reviewed by a board certified advanced clinical practitioner to complete your personal care plan. Depending upon the condition, your plan could have included both over the counter or prescription medications. Please review your pharmacy choice. Make sure the pharmacy is open so you can pick up prescription now. If there is a problem, you may contact your provider through MyChart messaging and have the prescription routed to another pharmacy. Your safety is important to us. If you have drug allergies check your prescription carefully.  For the next 24 hours you can use MyChart to ask questions about today's visit,  request a non-urgent call back, or ask for a work or school excuse. You will get an email in the next two days asking about your experience. I hope that your e-visit has been valuable and will speed your recovery. I spent 5-10 minutes on review and completion of this note- Artha Chiasson PAC  
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
## 2019-04-22 ENCOUNTER — Other Ambulatory Visit: Payer: Self-pay

## 2019-04-22 ENCOUNTER — Encounter: Payer: Self-pay | Admitting: Adult Health

## 2019-04-22 ENCOUNTER — Ambulatory Visit (INDEPENDENT_AMBULATORY_CARE_PROVIDER_SITE_OTHER): Payer: 59 | Admitting: Adult Health

## 2019-04-22 VITALS — BP 114/74 | HR 69 | Temp 98.6°F | Ht 71.0 in | Wt 271.3 lb

## 2019-04-22 DIAGNOSIS — R8290 Unspecified abnormal findings in urine: Secondary | ICD-10-CM

## 2019-04-22 DIAGNOSIS — I10 Essential (primary) hypertension: Secondary | ICD-10-CM | POA: Diagnosis not present

## 2019-04-22 DIAGNOSIS — Z87891 Personal history of nicotine dependence: Secondary | ICD-10-CM

## 2019-04-22 DIAGNOSIS — F411 Generalized anxiety disorder: Secondary | ICD-10-CM | POA: Diagnosis not present

## 2019-04-22 DIAGNOSIS — K760 Fatty (change of) liver, not elsewhere classified: Secondary | ICD-10-CM | POA: Diagnosis not present

## 2019-04-22 DIAGNOSIS — N3000 Acute cystitis without hematuria: Secondary | ICD-10-CM

## 2019-04-22 DIAGNOSIS — Z0000 Encounter for general adult medical examination without abnormal findings: Secondary | ICD-10-CM | POA: Insufficient documentation

## 2019-04-22 DIAGNOSIS — R319 Hematuria, unspecified: Secondary | ICD-10-CM | POA: Diagnosis not present

## 2019-04-22 DIAGNOSIS — E282 Polycystic ovarian syndrome: Secondary | ICD-10-CM

## 2019-04-22 DIAGNOSIS — N390 Urinary tract infection, site not specified: Secondary | ICD-10-CM | POA: Insufficient documentation

## 2019-04-22 LAB — POCT URINALYSIS DIPSTICK
Bilirubin, UA: NEGATIVE
Glucose, UA: NEGATIVE
Ketones, UA: NEGATIVE
Nitrite, UA: POSITIVE
Protein, UA: NEGATIVE
Spec Grav, UA: 1.015 (ref 1.010–1.025)
Urobilinogen, UA: 0.2 E.U./dL
pH, UA: 6.5 (ref 5.0–8.0)

## 2019-04-22 MED ORDER — CIPROFLOXACIN HCL 500 MG PO TABS: 500.0000 mg | ORAL_TABLET | Freq: Two times a day (BID) | ORAL | 0 refills | Status: DC

## 2019-04-22 NOTE — Assessment & Plan Note (Signed)
Last use July 2020

## 2019-04-22 NOTE — Assessment & Plan Note (Signed)
  Continue all medications as directed. Remain well hydrated, follow Mediterranean diet. Continue home PT and follow-up with Orthopedic Surgeon as directed. Continue with your various specialists as directed. Please schedule complete physical in 2 months, fasting labs the week prior. Will call you with UA results this afternoon. Continue to social distance and wear a mask when in public.

## 2019-04-22 NOTE — Patient Instructions (Addendum)
Mediterranean Diet A Mediterranean diet refers to food and lifestyle choices that are based on the traditions of countries located on the [employer]. This way of eating has been shown to help prevent certain conditions and improve outcomes for people who have chronic diseases, like kidney disease and heart disease. What are tips for following this plan? Lifestyle  Cook and eat meals together with your family, when possible.  Drink enough fluid to keep your urine clear or pale yellow.  Be physically active every day. This includes: ? Aerobic exercise like running or swimming. ? Leisure activities like gardening, walking, or housework.  Get 7-8 hours of sleep each night.  Reading food labels   Check the serving size of packaged foods. For foods such as rice and pasta, the serving size refers to the amount of cooked product, not dry.  Check the total fat in packaged foods. Avoid foods that have saturated fat or trans fats.  Check the ingredients list for added sugars, such as corn syrup. Shopping  At the grocery store, buy most of your food from the areas near the walls of the store. This includes: ? Fresh fruits and vegetables (produce). ? Grains, beans, nuts, and seeds. Some of these may be available in unpackaged forms or large amounts (in bulk). ? Fresh seafood. ? Poultry and eggs. ? Low-fat dairy products.  Buy whole ingredients instead of prepackaged foods.  Buy fresh fruits and vegetables in-season from local farmers markets.  Buy frozen fruits and vegetables in resealable bags.  If you do not have access to quality fresh seafood, buy precooked frozen shrimp or canned fish, such as tuna, salmon, or sardines.  Buy small amounts of raw or cooked vegetables, salads, or olives from the deli or salad bar at your store.  Stock your pantry so you always have certain foods on hand, such as olive oil, canned tuna, canned tomatoes, rice, pasta, and beans. Cooking  Cook  foods with extra-virgin olive oil instead of using butter or other vegetable oils.  Have meat as a side dish, and have vegetables or grains as your main dish. This means having meat in small portions or adding small amounts of meat to foods like pasta or stew.  Use beans or vegetables instead of meat in common dishes like chili or lasagna.  Experiment with different cooking methods. Try roasting or broiling vegetables instead of steaming or sauteing them.  Add frozen vegetables to soups, stews, pasta, or rice.  Add nuts or seeds for added healthy fat at each meal. You can add these to yogurt, salads, or vegetable dishes.  Marinate fish or vegetables using olive oil, lemon juice, garlic, and fresh herbs. Meal planning   Plan to eat 1 vegetarian meal one day each week. Try to work up to 2 vegetarian meals, if possible.  Eat seafood 2 or more times a week.  Have healthy snacks readily available, such as: ? Vegetable sticks with hummus. ? [place] yogurt. ? Fruit and nut trail mix.  Eat balanced meals throughout the week. This includes: ? Fruit: 2-3 servings a day ? Vegetables: 4-5 servings a day ? Low-fat dairy: 2 servings a day ? Fish, poultry, or lean meat: 1 serving a day ? Beans and legumes: 2 or more servings a week ? Nuts and seeds: 1-2 servings a day ? Whole grains: 6-8 servings a day ? Extra-virgin olive oil: 3-4 servings a day  Limit red meat and sweets to only a few servings a month What  are my food choices?  Mediterranean diet ? Recommended  Grains: Whole-grain pasta. Brown rice. Bulgar wheat. Polenta. Couscous. Whole-wheat bread. [place].  Vegetables: Artichokes. Beets. Broccoli. Cabbage. Carrots. Eggplant. Green beans. Chard. Kale. Spinach. Onions. Leeks. Peas. Squash. Tomatoes. Peppers. Radishes.  Fruits: Apples. Apricots. Avocado. Berries. Bananas. Cherries. Dates. Figs. Grapes. Lemons. Melon. Oranges. Peaches. Plums. Pomegranate.  Meats and other  protein foods: Beans. Almonds. Sunflower seeds. Pine nuts. Peanuts. [place]. Salmon. Scallops. Shrimp. [place]. Tilapia. Clams. Oysters. Eggs.  Dairy: Low-fat milk. Cheese. Greek yogurt.  Beverages: Water. Red wine. Herbal tea.  Fats and oils: Extra virgin olive oil. Avocado oil. Grape seed oil.  Sweets and desserts: [place] yogurt with honey. Baked apples. Poached pears. Trail mix.  Seasoning and other foods: Basil. Cilantro. Coriander. Cumin. Mint. Parsley. Sage. Rosemary. Tarragon. Garlic. Oregano. Thyme. Pepper. Balsalmic vinegar. Tahini. Hummus. Tomato sauce. Olives. Mushrooms. ? Limit these  Grains: Prepackaged pasta or rice dishes. Prepackaged cereal with added sugar.  Vegetables: Deep fried potatoes (french fries).  Fruits: Fruit canned in syrup.  Meats and other protein foods: Beef. Pork. Lamb. Poultry with skin. Hot dogs. Berniece Salines.  Dairy: Ice cream. Sour cream. Whole milk.  Beverages: Juice. Sugar-sweetened soft drinks. Beer. Liquor and spirits.  Fats and oils: Butter. Canola oil. Vegetable oil. Beef fat (tallow). Lard.  Sweets and desserts: Cookies. Cakes. Pies. Candy.  Seasoning and other foods: Mayonnaise. Premade sauces and marinades. The items listed may not be a complete list. Talk with your dietitian about what dietary choices are right for you. Summary  The Mediterranean diet includes both food and lifestyle choices.  Eat a variety of fresh fruits and vegetables, beans, nuts, seeds, and whole grains.  Limit the amount of red meat and sweets that you eat.  This information is not intended to replace advice given to you by your health care provider. Make sure you discuss any questions you have with your health care provider. Document Released: 03/14/2016 Document Revised: 03/21/2016 Document Reviewed: 03/14/2016 Elsevier Patient Education  2020 [place] all medications as directed. Remain well hydrated, follow Mediterranean diet. Continue home PT and  follow-up with Orthopedic Surgeon as directed. Continue with your various specialists as directed. Please schedule complete physical in 2 months, fasting labs the week prior. Will call you with UA results this afternoon. Continue to social distance and wear a mask when in public. WELCOME TO THE PUBLIC.

## 2019-04-22 NOTE — Assessment & Plan Note (Signed)
BP  114/74, HR 69 Currently on Lisinopril/HCTX 10/12.5mg  QD

## 2019-04-22 NOTE — Assessment & Plan Note (Signed)
Failed 5 day course of Macrobid UA: Blood- large Nitrite- + Leu- Small C/S sent Wt 270 CMP 03/2019 GFR >60 Cipro 500mg  BID x 5 days

## 2019-04-22 NOTE — Assessment & Plan Note (Signed)
Followed by Endo 

## 2019-04-22 NOTE — Assessment & Plan Note (Signed)
Followed by Tracy [name]

## 2019-04-22 NOTE — Assessment & Plan Note (Signed)
PTSD GAD Bipolar II Followed by psychiatry/psycology

## 2019-04-23 ENCOUNTER — Encounter: Payer: Self-pay | Admitting: Adult Health

## 2019-04-26 DIAGNOSIS — Z760 Encounter for issue of repeat prescription: Secondary | ICD-10-CM | POA: Diagnosis not present

## 2019-04-26 LAB — CULTURE, URINE COMPREHENSIVE

## 2019-04-27 ENCOUNTER — Encounter: Payer: Self-pay | Admitting: Adult Health

## 2019-04-28 ENCOUNTER — Other Ambulatory Visit: Payer: Self-pay

## 2019-04-28 ENCOUNTER — Telehealth: Payer: Self-pay | Admitting: Acute Care

## 2019-04-28 ENCOUNTER — Encounter: Payer: Self-pay | Admitting: Pulmonary Disease

## 2019-04-28 ENCOUNTER — Ambulatory Visit: Payer: 59 | Admitting: Acute Care

## 2019-04-28 DIAGNOSIS — G4733 Obstructive sleep apnea (adult) (pediatric): Secondary | ICD-10-CM | POA: Diagnosis not present

## 2019-04-28 DIAGNOSIS — Z23 Encounter for immunization: Secondary | ICD-10-CM | POA: Diagnosis not present

## 2019-04-28 NOTE — Assessment & Plan Note (Signed)
[facility] on CPAP therapy Had broken nasal pillows and is requesting hybrid face mask. Non-compliance on down load is due to the fact nasal pillows were broken Plan We will place an order for supplies and equipment We will give you a sample face mask to try to make sure you like the full face mask.  Continue on CPAP at bedtime. You appear to be benefiting from the treatment  Goal is to wear for at least 6 hours each night for maximal clinical benefit. Continue to work on weight loss, as the link between excess weight  and sleep apnea is well established.   Remember to establish a good bedtime routine, and work on sleep hygiene.  Limit daytime naps , avoid stimulants such as caffeine and nicotine close to bedtime, exercise daily to promote sleep quality, avoid heavy spicy, fried , or rich foods before bed. Ensure adequate exposure to natural light during the day,establish a relaxing bedtime routine with a pleasant sleep environment ( Bedroom between 60 and 67 degrees, turn off bright lights , TV or device screens screens , consider black out curtains or white noise machines) Do not drive if sleepy. Remember to clean mask, tubing, filter, and reservoir once weekly with soapy water.  Follow up with Dr. Halford Chessman  or [name] NP  In 6 months  or before as needed.  Flu shot today Please contact office for sooner follow up if symptoms do not improve or worsen or seek emergency care

## 2019-04-28 NOTE — Patient Instructions (Addendum)
It is great to see you today. We will place an order for supplies and equipment We will give you a sample face mask to try to make sure you like the full face mask.  Continue on CPAP at bedtime. You appear to be benefiting from the treatment  Goal is to wear for at least 6 hours each night for maximal clinical benefit. Continue to work on weight loss, as the link between excess weight  and sleep apnea is well established.   Remember to establish a good bedtime routine, and work on sleep hygiene.  Limit daytime naps , avoid stimulants such as caffeine and nicotine close to bedtime, exercise daily to promote sleep quality, avoid heavy spicy, fried , or rich foods before bed. Ensure adequate exposure to natural light during the day,establish a relaxing bedtime routine with a pleasant sleep environment ( Bedroom between 60 and 67 degrees, turn off bright lights , TV or device screens screens , consider black out curtains or white noise machines) Do not drive if sleepy. Remember to clean mask, tubing, filter, and reservoir once weekly with soapy water.  Follow up with Dr. Halford Chessman  or [name] NP  In 6 months  or before as needed.  Flu shot today Please contact office for sooner follow up if symptoms do not improve or worsen or seek emergency care

## 2019-04-28 NOTE — Telephone Encounter (Signed)
Called spoke with pt to verify her address on file and to let her know we would send her immunization record, including the flu vaccine she received today, in the mail. Pt expressed understanding with no additional questions.   Immunization record has been printed from pt's chart and put to outgoing mail per pt request. Nothing further needed at this time.

## 2019-04-28 NOTE — Progress Notes (Addendum)
History of Present Illness Tracy [name] is a 53 y.o. female with Severe OSA on CPAP. She is followed by Dr. Halford [name]. PMHx >> Migraine headaches, Hypothyroidism, GERD, NASH, HTN, HLD, Anxiety, Depression, DM, Neuropathy PCOS  04/28/2019 Follow up for CPAP. Pt presents for follow up. She states she has been doing well. She states  her nasal pillow has broken and she needs new supplies. She has not been in for an OV since 12/2014, so she is here today for follow up. She has been unable to use her CPAP as her nasal pillows have broken. She states she feels much better when using CPAP. She has no daytime sleepiness, no am headaches.  She is requesting a full face mask when she gets her new supplies, as she feels the nasal pillows are not working as she has allergies and nasal stuffiness. She states she had lost 16 pounds  and has recently had hip replacement surgery and gained a bit of that weight back. She is motivated to get back on track for additional weight loss. She states this has been made more difficult with COVID restrictions. With  weight loss she has been able to come off her DM medications and has been able to decrease her HTN medication.She doers work remotely for Tracy [name].She denies fever, chest pain, orthopnea or hemoptysis. She is motivated to maintain compliance with her CPAP therapy.  Down Load 04/28/2019>> of note nasal pillow has been broken and she is unable to wear  Auto Set 5-15 cm Pressure Usage days 6/30 > 4 hours >> 6 Less than 4 Hrs>> 0  Median pressure 7.1 cm H2O Max Pressure 12.4 cm H2O AHI 3.4 Central>> 1.3, Obstructive 1.3, Unknown 0.5  Test Results: HST 08/31/14 >> AHI 72.5 and SaO2 low 73%. Auto CPAP 09/24/14 to 10/23/14 >> used on 12 of 30 nights with average 6 hrs 52 min.  Average AHI 1.9 with median CPAP 9 cm H2O and 95 th percentile CPAP 13 cm H2O.    CBC Latest Ref Rng & Units 03/20/2019 03/16/2019 06/22/2018  WBC 4.0 - 10.5 K/uL 7.3 5.1 7.1   Hemoglobin 12.0 - 15.0 g/dL 11.3(L) 14.0 14.3  Hematocrit 36.0 - 46.0 % 35.9(L) 44.2 45.9  Platelets 150 - 400 K/uL 169 208 243    BMP Latest Ref Rng & Units 03/20/2019 03/16/2019 06/22/2018  Glucose 70 - 99 mg/dL 111(H) 72 95  BUN 6 - 20 mg/dL 8 18 13   Creatinine 0.44 - 1.00 mg/dL 0.69 0.92 0.86  BUN/Creat Ratio 6 - 22 (calc) - - -  Sodium 135 - 145 mmol/L 139 138 138  Potassium 3.5 - 5.1 mmol/L 4.2 3.7 3.7  Chloride 98 - 111 mmol/L 108 103 102  CO2 22 - 32 mmol/L 27 27 26   Calcium 8.9 - 10.3 mg/dL 8.7(L) 9.2 9.6    BNP No results found for: BNP  ProBNP No results found for: PROBNP  PFT No results found for: FEV1PRE, FEV1POST, FVCPRE, FVCPOST, TLC, DLCOUNC, PREFEV1FVCRT, PSTFEV1FVCRT  No results found.   Past medical hx Past Medical History:  Diagnosis Date  . Anxiety   . Arthritis    back, knees, right elbow  . Bipolar disorder (Tracy [name])   . Cirrhosis (Tracy [name])   . Dental crowns present   . Depression   . Diabetes mellitus without complication (Tracy [name])   . GERD (gastroesophageal reflux disease)   . High cholesterol    no current med.  Marland Kitchen History of kidney stones   .  History of migraine headaches   . Hypertension    under control with meds., has been on med. x 4 yr.  . Hypothyroidism   . NASH (nonalcoholic steatohepatitis)   . PCOS (polycystic ovarian syndrome)   . PONV (postoperative nausea and vomiting)    also hx. of emergence delirium 2007  . Sleep apnea    uses CPAP nightly  . Trigeminal neuralgia      Social History   Tobacco Use  . Smoking status: Former Smoker    Packs/day: 0.50    Years: 15.00    Pack years: 7.50    Types: Cigarettes    Quit date: 02/2019    Years since quitting: 0.2  . Smokeless tobacco: Never Used  Substance Use Topics  . Alcohol use: No    Alcohol/week: 0.0 standard drinks    Comment: Has not had any alcohol since 07/2014 - before this date, she rarely drink.  . Drug use: No    Ms.Tracy [name] reports that she quit smoking about  2 months ago. Her smoking use included cigarettes. She has a 7.50 pack-year smoking history. She has never used smokeless tobacco. She reports that she does not drink alcohol or use drugs.  Tobacco Cessation: Former smoker, quit 02/2019 Smoked half pack per day x 15 years. Total Pack year history = 7.5  Past surgical hx, Family hx, Social hx all reviewed.  Current Outpatient Medications on File Prior to Visit  Medication Sig  . acetaminophen (TYLENOL) 500 MG tablet Take 500 mg by mouth every 6 (six) hours as needed. Took yesterday for pain 2 tabs  . clonazePAM (KLONOPIN) 1 MG tablet Take 1 mg by mouth 3 (three) times daily.   Marland Kitchen FLUoxetine (PROZAC) 40 MG capsule Take 40 mg by mouth every morning.   . fluticasone (FLONASE) 50 MCG/ACT nasal spray Place 1 spray into both nostrils daily as needed for allergies or rhinitis.  Marland Kitchen lamoTRIgine (LAMICTAL) 200 MG tablet Take 400 mg by mouth every evening.   Marland Kitchen lisinopril-hydrochlorothiazide (ZESTORETIC) 10-12.5 MG tablet TAKE 1 TABLET BY MOUTH DAILY. **NEED VISIT** **NO FURTHER REFILLS UNTIL SEEN** (Patient taking differently: Take 1 tablet by mouth daily. )  . loratadine (CLARITIN) 10 MG tablet Take 10 mg by mouth daily as needed for allergies.  Marland Kitchen ondansetron (ZOFRAN-ODT) 4 MG disintegrating tablet Take 4 mg by mouth every 4 (four) hours as needed for nausea or vomiting.   . RABEprazole (ACIPHEX) 20 MG tablet Take one tablet, daily. (Patient taking differently: Take 20 mg by mouth 2 (two) times daily. Take one tablet, daily)  . rizatriptan (MAXALT) 10 MG tablet Take 1 tablet (10 mg total) by mouth as needed for migraine. May repeat in 2 hours if needed  . SYNTHROID 200 MCG tablet Take 300 mcg 3 days a week and 200 mcg 4 days a week. (Patient taking differently: Take 200-300 mcg by mouth See admin instructions. 300 mcg five days a week, 200 mcg twice weekly)  . traMADol (ULTRAM) 50 MG tablet Take 1-2 tablets (50-100 mg total) by mouth every 6 (six) hours as  needed.   No current facility-administered medications on file prior to visit.      Allergies  Allergen Reactions  . Sulfa Antibiotics Itching, Swelling and Rash  . Bupropion Hcl Itching and Rash  . Codeine Itching  . Sulfamethoxazole-Trimethoprim Rash    Review Of Systems:  Constitutional:   No  weight loss, night sweats,  Fevers, chills, fatigue, or  lassitude.  HEENT:  No headaches,  Difficulty swallowing,  Tooth/dental problems, or  Sore throat,                No sneezing, itching, ear ache, nasal congestion, post nasal drip,   CV:  No chest pain,  Orthopnea, PND, swelling in lower extremities, anasarca, dizziness, palpitations, syncope.   GI  No heartburn, indigestion, abdominal pain, nausea, vomiting, diarrhea, change in bowel habits, loss of appetite, bloody stools.   Resp: No shortness of breath with exertion or at rest.  No excess mucus, no productive cough,  No non-productive cough,  No coughing up of blood.  No change in color of mucus.  No wheezing.  No chest wall deformity  Skin: no rash or lesions.  GU: no dysuria, change in color of urine, no urgency or frequency.  No flank pain, no hematuria   MS:  No joint pain or swelling.  No decreased range of motion.  No back pain.  Psych:  No change in mood or affect. No depression or anxiety.  No memory loss.   Vital Signs BP 126/72 (BP Location: Right Arm, Patient Position: Sitting, Cuff Size: Normal)   Pulse 75   Temp 97.8 F (36.6 C)   Ht 5\' 11"  (1.803 m)   Wt 273 lb 12.8 oz (124.2 kg)   SpO2 98%   BMI 38.19 kg/m    Physical Exam:  General- No distress,  A&Ox3, pleasant ENT: No sinus tenderness, TM clear, pale nasal mucosa, no oral exudate,no post nasal drip, no LAN Cardiac: S1, S2, regular rate and rhythm, no murmur Chest: No wheeze/ rales/ dullness; no accessory muscle use, no nasal flaring, no sternal retractions Abd.: Soft Non-tender, ND, BS + Ext: No clubbing cyanosis, edema Neuro:  normal  strength, MAE x 4, A&O x 3, approrpiate Skin: No rashes, No lesions warm and dry Psych: normal mood and behavior   Assessment/Plan  Obstructive sleep apnea OSA on CPAP therapy Had broken nasal pillows and is requesting hybrid face mask. Non-compliance on down load is due to the fact nasal pillows were broken Plan We will place an order for supplies and equipment We will give you a sample face mask to try to make sure you like the full face mask.  Continue on CPAP at bedtime. You appear to be benefiting from the treatment  Goal is to wear for at least 6 hours each night for maximal clinical benefit. Continue to work on weight loss, as the link between excess weight  and sleep apnea is well established.   Remember to establish a good bedtime routine, and work on sleep hygiene.  Limit daytime naps , avoid stimulants such as caffeine and nicotine close to bedtime, exercise daily to promote sleep quality, avoid heavy spicy, fried , or rich foods before bed. Ensure adequate exposure to natural light during the day,establish a relaxing bedtime routine with a pleasant sleep environment ( Bedroom between 60 and 67 degrees, turn off bright lights , TV or device screens screens , consider black out curtains or white noise machines) Do not drive if sleepy. Remember to clean mask, tubing, filter, and reservoir once weekly with soapy water.  Follow up with Dr. Halford [name]  or [name] NP  In 6 months  or before as needed.  Flu shot today Please contact office for sooner follow up if symptoms do not improve or worsen or seek emergency care     This appointment was 30 min long with over 50% of the time in direct  face-to-face patient care, assessment, plan of care, and follow-up.   Magdalen Spatz, NP 04/28/2019  1:02 PM

## 2019-04-29 ENCOUNTER — Other Ambulatory Visit (INDEPENDENT_AMBULATORY_CARE_PROVIDER_SITE_OTHER): Payer: Self-pay

## 2019-04-29 ENCOUNTER — Ambulatory Visit (INDEPENDENT_AMBULATORY_CARE_PROVIDER_SITE_OTHER): Payer: 59 | Admitting: Orthopaedic Surgery

## 2019-04-29 ENCOUNTER — Encounter: Payer: Self-pay | Admitting: Adult Health

## 2019-04-29 ENCOUNTER — Encounter: Payer: Self-pay | Admitting: Orthopaedic Surgery

## 2019-04-29 DIAGNOSIS — Z96642 Presence of left artificial hip joint: Secondary | ICD-10-CM

## 2019-04-29 MED ORDER — SYNTHROID 200 MCG PO TABS: ORAL_TABLET | ORAL | 1 refills | Status: DC

## 2019-04-29 NOTE — Progress Notes (Signed)
The patient is now 6 weeks status post a left total hip arthroplasty.  She says she is doing great.  She has a cane with her today but she says is really just for decoration.  She has no issues at all.  She says her range of motion and strength are improving daily.  On examination she looks me easily put her left operative hip the range of motion with no difficulty at all.  Her leg lengths are equal.  Her hip feels stable.  Her incisions healed nicely.  This point we do not need to see her back for 6 months.  At that visit I would like a standing low AP pelvis and lateral of her left operative hip.  She knows that if there are any issues prior to seeing [place] in 6 months from now that she will let us know and can come see [place] and reschedule if needed.

## 2019-04-29 NOTE — Progress Notes (Signed)
Reviewed and agree with assessment/plan.   Jarman Litton, MD [facility] 07/31/2016, 12:24 PM Pager:  [phone]  

## 2019-04-30 DIAGNOSIS — G4733 Obstructive sleep apnea (adult) (pediatric): Secondary | ICD-10-CM | POA: Diagnosis not present

## 2019-05-03 ENCOUNTER — Encounter: Payer: Self-pay | Admitting: Orthopaedic Surgery

## 2019-05-04 DIAGNOSIS — F3181 Bipolar II disorder: Secondary | ICD-10-CM | POA: Diagnosis not present

## 2019-05-05 ENCOUNTER — Encounter: Payer: Self-pay | Admitting: Adult Health

## 2019-05-05 ENCOUNTER — Other Ambulatory Visit: Payer: Self-pay

## 2019-05-05 ENCOUNTER — Encounter (HOSPITAL_COMMUNITY): Payer: Self-pay | Admitting: Emergency Medicine

## 2019-05-05 ENCOUNTER — Telehealth: Payer: Self-pay | Admitting: Adult Health

## 2019-05-05 ENCOUNTER — Emergency Department (HOSPITAL_COMMUNITY)
Admission: EM | Admit: 2019-05-05 | Discharge: 2019-05-05 | Disposition: A | Discharge status: Home / Self Care | Payer: 59 | Attending: Emergency Medicine | Admitting: Emergency Medicine

## 2019-05-05 ENCOUNTER — Emergency Department (HOSPITAL_COMMUNITY): Payer: 59

## 2019-05-05 DIAGNOSIS — R079 Chest pain, unspecified: Secondary | ICD-10-CM

## 2019-05-05 DIAGNOSIS — R7989 Other specified abnormal findings of blood chemistry: Secondary | ICD-10-CM | POA: Diagnosis not present

## 2019-05-05 DIAGNOSIS — Z79899 Other long term (current) drug therapy: Secondary | ICD-10-CM | POA: Diagnosis not present

## 2019-05-05 DIAGNOSIS — F319 Bipolar disorder, unspecified: Secondary | ICD-10-CM | POA: Diagnosis not present

## 2019-05-05 DIAGNOSIS — Z87891 Personal history of nicotine dependence: Secondary | ICD-10-CM | POA: Diagnosis not present

## 2019-05-05 DIAGNOSIS — E039 Hypothyroidism, unspecified: Secondary | ICD-10-CM | POA: Insufficient documentation

## 2019-05-05 DIAGNOSIS — M542 Cervicalgia: Secondary | ICD-10-CM | POA: Insufficient documentation

## 2019-05-05 DIAGNOSIS — Z9889 Other specified postprocedural states: Secondary | ICD-10-CM | POA: Diagnosis not present

## 2019-05-05 DIAGNOSIS — R0789 Other chest pain: Secondary | ICD-10-CM | POA: Diagnosis not present

## 2019-05-05 DIAGNOSIS — I10 Essential (primary) hypertension: Secondary | ICD-10-CM | POA: Diagnosis not present

## 2019-05-05 LAB — BASIC METABOLIC PANEL
Anion gap: 8 (ref 5–15)
BUN: 14 mg/dL (ref 6–20)
CO2: 25 mmol/L (ref 22–32)
Calcium: 8.8 mg/dL — ABNORMAL LOW (ref 8.9–10.3)
Chloride: 105 mmol/L (ref 98–111)
Creatinine, Ser: 0.76 mg/dL (ref 0.44–1.00)
GFR calc Af Amer: >60 mL/min (ref >60)
GFR calc non Af Amer: >60 mL/min (ref >60)
Glucose, Bld: 73 mg/dL (ref 70–99)
Potassium: 4.1 mmol/L (ref 3.5–5.1)
Sodium: 138 mmol/L (ref 135–145)

## 2019-05-05 LAB — CBC
HCT: 36.2 % (ref 36.0–46.0)
Hemoglobin: 11.2 g/dL — ABNORMAL LOW (ref 12.0–15.0)
MCH: 27.1 pg (ref 26.0–34.0)
MCHC: 30.9 g/dL (ref 30.0–36.0)
MCV: 87.7 fL (ref 80.0–100.0)
Platelets: 226 10*3/uL (ref 150–400)
RBC: 4.13 MIL/uL (ref 3.87–5.11)
RDW: 14.7 % (ref 11.5–15.5)
WBC: 3.9 10*3/uL — ABNORMAL LOW (ref 4.0–10.5)
nRBC: 0 % (ref 0.0–0.2)

## 2019-05-05 LAB — D-DIMER, QUANTITATIVE: D-Dimer, Quant: 0.97 ug/mL-FEU — ABNORMAL HIGH (ref 0.00–0.50)

## 2019-05-05 LAB — TROPONIN I (HIGH SENSITIVITY)
Troponin I (High Sensitivity): 4 ng/L (ref <18)
Troponin I (High Sensitivity): 5 ng/L (ref <18)

## 2019-05-05 MED ORDER — IOHEXOL 350 MG/ML SOLN
75.0000 mL | Freq: Once | INTRAVENOUS | Status: AC | PRN
  Administered 2019-05-05: 75 mL via INTRAVENOUS

## 2019-05-05 MED ORDER — SODIUM CHLORIDE 0.9% FLUSH
3.0000 mL | Freq: Once | INTRAVENOUS | Status: AC
  Administered 2019-05-05: 3 mL via INTRAVENOUS

## 2019-05-05 NOTE — ED Provider Notes (Signed)
Care assumed from previous provider PA [place]. Please see note for further details. Case discussed, plan agreed upon. Will follow up on pending CT angio to evaluate for PE. Per previous provider, anticipate discharge home with close follow up if negative.   CT angio reviewed with no acute findings.  Patient evaluated and updated on results.  Normal cardiopulmonary examination.  Appears in no acute distress.  Discussed close outpatient follow-up and return precautions.  All questions were answered. Patient discharged home in satisfactory condition.   Ward, Ozella Almond, PA-C 05/05/19 [id number]    Carmin Muskrat, MD 05/05/19 [id number]

## 2019-05-05 NOTE — Telephone Encounter (Signed)
Pt is going to be seen at ED.  Will await results of ED visit and see they deem a cardiology referral is necessary.  Charyl Bigger, CMA

## 2019-05-05 NOTE — ED Triage Notes (Signed)
Chest pain x 2 weeks-- pressure- no nausea-- had recent total hip replacement 7 weeks, denies shortness of breath. Never has had this pain before.

## 2019-05-05 NOTE — Telephone Encounter (Signed)
Patient called back says she attempted to get Appt w/ Cardiologist but was told a referral is required.  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

## 2019-05-05 NOTE — Telephone Encounter (Signed)
Pt called states has been dealing w/ Chest pressure & Elevated BP for 2 weeks ( states she sent message thru MyChart for advice.  --Forwarded call to medical asst to triage, was told to advise Patient to go to via EMS to ED for  Immediate medical treatment.  --FYI to clinical staff --glh

## 2019-05-05 NOTE — Discharge Instructions (Signed)
It was my pleasure taking care of you today!   Fortunately, your work up today was very reassuring. With that being said, we would still like you to call your doctor in the morning to schedule a follow up appointment.   Return to ER for new or worsening symptoms, any additional concerns.

## 2019-05-05 NOTE — ED Provider Notes (Signed)
[facility] EMERGENCY DEPARTMENT Provider Note   CSN: [id number] Arrival date & time: 05/05/19  1149     History   Chief Complaint Chief Complaint  Patient presents with  . Chest Pain    HPI Tracy [name] is a 53 y.o. female.     HPI Patient presents to the emergency department with constant chest pain over the last 2 weeks.  Patient states it is a pressure.  Patient states that she had a hip replacement several weeks ago.  She states she is had no other symptoms.  She states earlier today that pain did seem to go into her lower neck.  The patient denies shortness of breath, headache,blurred vision, neck pain, fever, cough, weakness, numbness, dizziness, anorexia, edema, abdominal pain, nausea, vomiting, diarrhea, rash, back pain, dysuria, hematemesis, bloody stool, near syncope, or syncope. Past Medical History:  Diagnosis Date  . Anxiety   . Arthritis    back, knees, right elbow  . Bipolar disorder ([place])   . Cirrhosis ([place])   . Dental crowns present   . Depression   . Diabetes mellitus without complication ([place])   . GERD (gastroesophageal reflux disease)   . High cholesterol    no current med.  Marland Kitchen History of kidney stones   . History of migraine headaches   . Hypertension    under control with meds., has been on med. x 4 yr.  . Hypothyroidism   . NASH (nonalcoholic steatohepatitis)   . PCOS (polycystic ovarian syndrome)   . PONV (postoperative nausea and vomiting)    also hx. of emergence delirium 2007  . Sleep apnea    uses CPAP nightly  . Trigeminal neuralgia     Patient Active Problem List   Diagnosis Date Noted  . Healthcare maintenance 04/22/2019  . UTI (urinary tract infection) 04/22/2019  . 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 Unilateral primary osteoarthritis, left hip 02/10/2019  . Greater trochanteric bursitis of left hip 07/24/2018  . Arthritis of left hip 06/25/2018  . Pain in left knee 01/21/2018   . Encounter for general adult medical examination with abnormal findings 01/04/2018  . Vitamin D deficiency 01/04/2018  . Lower back pain 07/18/2017  . Hemorrhoids, internal 07/18/2017  . Trigeminal neuralgia 01/18/2016  . Bipolar disorder ([place]) 11/28/2015  . Borderline personality disorder ([place]) 11/28/2015  . Carpal tunnel syndrome 09/06/2015  . MDD (major depressive disorder) 06/06/2015  . Severe episode of recurrent major depressive disorder, without psychotic features ([place])   . History of posttraumatic stress disorder (PTSD)   . GAD (generalized anxiety disorder)   . Insulin resistance 02/09/2015  . 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 Acanthosis nigricans, acquired 02/09/2015  . Nonalcoholic fatty liver disease 02/09/2015  . Hepatic cirrhosis ([place]) 02/09/2015  . Female hirsutism 02/09/2015  . Oligomenorrhea 02/09/2015  . Infertility associated with anovulation 02/09/2015  . Morbid obesity ([place]) 10/31/2014  . Anxiety and depression 10/31/2014  . Obstructive sleep apnea 06/27/2014  . Diabetes mellitus, type 2 ([place]) 06/10/2013  . 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[place]) 01/01/2012  . Cystic teratoma   . PCOS (polycystic ovarian syndrome)   . Infertility, female   . TOBACCO USE, QUIT 02/08/2010  . HYPOTHYROIDISM, POSTSURGICAL 10/02/2007  . POLYCYSTIC OVARIAN DISEASE 10/02/2007  . Elevated cholesterol 10/02/2007  . DEPRESSION 10/02/2007  . Migraine 10/02/2007  . Essential hypertension 10/02/2007  . RENAL CALCULUS, HX OF 10/02/2007    Past Surgical History:  Procedure Laterality Date  . ACHILLES TENDON SURGERY Left 2007  .  APPENDECTOMY    . CARPAL TUNNEL RELEASE Right 01/12/2016   Procedure: RIGHT CARPAL TUNNEL RELEASE;  Surgeon: Roseanne Kaufman, MD;  Location: [facility];  Service: Orthopedics;  Laterality: Right;  . COLONOSCOPY WITH PROPOFOL N/A 04/20/2014   Procedure: COLONOSCOPY WITH PROPOFOL;  Surgeon: Arta Silence, MD;  Location: [facility];  Service: Endoscopy;   Laterality: N/A;  . ESOPHAGOGASTRODUODENOSCOPY (EGD) WITH PROPOFOL N/A 04/20/2014   Procedure: ESOPHAGOGASTRODUODENOSCOPY (EGD) WITH PROPOFOL;  Surgeon: Arta Silence, MD;  Location: [facility];  Service: Endoscopy;  Laterality: N/A;  . EXCISION HAGLUND'S DEFORMITY WITH ACHILLES TENDON REPAIR Right 02/25/2013   Procedure: RIGHT ACHILLES DEBRIDEMENT AND RECONSTRUCTION;  HAGLUND'S EXCISION; GASTROC RECESSION AND FLEXOR HALLUCIS LONGUS TRANSFER;  Surgeon: Wylene Simmer, MD;  Location: [facility];  Service: Orthopedics;  Laterality: Right;  . LUMBAR LAMINECTOMY     X 3  . neck fusion    . OVARIAN CYST SURGERY Left 1998  . PILONIDAL CYST EXCISION  1994  . RIGHT OOPHORECTOMY    . STERIOD INJECTION Left 01/12/2016   Procedure: STEROID INJECTION LEFT WRIST;  Surgeon: Roseanne Kaufman, MD;  Location: [facility];  Service: Orthopedics;  Laterality: Left;  . TOTAL HIP ARTHROPLASTY Left 03/19/2019   Procedure: LEFT TOTAL HIP ARTHROPLASTY ANTERIOR APPROACH;  Surgeon: Mcarthur Rossetti, MD;  Location: [facility];  Service: Orthopedics;  Laterality: Left;  . TOTAL THYROIDECTOMY  2003  . UPPER GI ENDOSCOPY  01/19/2015     OB History    Gravida  0   Para      Term      Preterm      AB      Living        SAB      TAB      Ectopic      Multiple      Live Births               Home Medications    Prior to Admission medications   Medication Sig Start Date End Date Taking? Authorizing Provider  acetaminophen (TYLENOL) 500 MG tablet Take 500 mg by mouth every 6 (six) hours as needed. Took yesterday for pain 2 tabs   Yes [provider]  clonazePAM (KLONOPIN) 1 MG tablet Take 1 mg by mouth 3 (three) times daily.  02/03/17  Yes [provider]  FLUoxetine (PROZAC) 40 MG capsule Take 40 mg by mouth every morning.  06/15/18  Yes [provider]  fluticasone (FLONASE) 50 MCG/ACT nasal spray Place 1 spray into both nostrils daily as needed  for allergies or rhinitis.   Yes [provider]  lamoTRIgine (LAMICTAL) 200 MG tablet Take 400 mg by mouth every evening.  02/26/18  Yes [provider]  lisinopril-hydrochlorothiazide (ZESTORETIC) 10-12.5 MG tablet TAKE 1 TABLET BY MOUTH DAILY. **NEED VISIT** **NO FURTHER REFILLS UNTIL SEEN** Patient taking differently: Take 1 tablet by mouth daily.  03/07/19  Yes Plotnikov, Evie Lacks, MD  loratadine (CLARITIN) 10 MG tablet Take 10 mg by mouth daily as needed for allergies.   Yes [provider]  ondansetron (ZOFRAN-ODT) 4 MG disintegrating tablet Take 4 mg by mouth every 4 (four) hours as needed for nausea or vomiting.    Yes [provider]  RABEprazole (ACIPHEX) 20 MG tablet Take one tablet, daily. Patient taking differently: Take 20 mg by mouth 2 (two) times daily.  10/28/18 10/28/19 Yes [name], MD  rizatriptan (MAXALT) 10 MG tablet Take 1 tablet (  10 mg total) by mouth as needed for migraine. May repeat in 2 hours if needed 12/23/17  Yes Burns, Claudina Lick, MD  SYNTHROID 200 MCG tablet Take 300 mcg 3 days a week and 200 mcg 4 days a week. Patient taking differently: Take 200-300 mcg by mouth See admin instructions. Take 1 1/2 tablet (355mcg) on Sunday and Monday. Take 1 tablet (249mcg) all other days 04/29/19  Yes [name], MD  traMADol (ULTRAM) 50 MG tablet Take 1-2 tablets (50-100 mg total) by mouth every 6 (six) hours as needed. 04/13/19  Yes Mcarthur Rossetti, MD    Family History Family History  Problem Relation Age of Onset  . Heart disease Father   . Depression Father   . Heart attack Father   . Cancer Brother        THROAT colon  . Depression Brother   . Anxiety disorder Brother   . Depression Sister   . Breast cancer Mother   . Hypertension Mother   . Depression Maternal Grandmother   . Stroke Maternal Grandfather     Social History Social History   Tobacco Use  . Smoking status: Former Smoker    Packs/day: 0.50     Years: 15.00    Pack years: 7.50    Types: Cigarettes    Quit date: 02/2019    Years since quitting: 0.2  . Smokeless tobacco: Never Used  Substance Use Topics  . Alcohol use: No    Alcohol/week: 0.0 standard drinks    Comment: Has not had any alcohol since 07/2014 - before this date, she rarely drink.  . Drug use: No     Allergies   Sulfa antibiotics, Bupropion hcl, Codeine, and Sulfamethoxazole-trimethoprim   Review of Systems Review of Systems All other systems negative except as documented in the HPI. All pertinent positives and negatives as reviewed in the HPI.  Physical Exam Updated Vital Signs BP (!) 172/92   Pulse 67   Temp 97.8 F (36.6 C) (Oral)   Resp 16   Ht 5\' 11"  (1.803 m)   Wt 123.8 kg   SpO2 100%   BMI 38.08 kg/m   Physical Exam Vitals signs and nursing note reviewed.  Constitutional:      General: She is not in acute distress.    Appearance: She is well-developed.  HENT:     Head: Normocephalic and atraumatic.  Eyes:     Pupils: Pupils are equal, round, and reactive to light.  Neck:     Musculoskeletal: Normal range of motion and neck supple.  Cardiovascular:     Rate and Rhythm: Normal rate and regular rhythm.     Heart sounds: Normal heart sounds. No murmur. No friction rub. No gallop.   Pulmonary:     Effort: Pulmonary effort is normal. No respiratory distress.     Breath sounds: Normal breath sounds. No wheezing.  Abdominal:     General: Bowel sounds are normal. There is no distension.     Palpations: Abdomen is soft.     Tenderness: There is no abdominal tenderness.  Skin:    General: Skin is warm and dry.     Capillary Refill: Capillary refill takes less than 2 seconds.     Findings: No erythema or rash.  Neurological:     Mental Status: She is alert and oriented to person, place, and time.     Motor: No abnormal muscle tone.     Coordination: Coordination normal.  Psychiatric:  Behavior: Behavior normal.      ED  Treatments / Results  Labs (all labs ordered are listed, but only abnormal results are displayed) Labs Reviewed  BASIC METABOLIC PANEL - Abnormal; Notable for the following components:      Result Value   Calcium 8.8 (*)    All other components within normal limits  CBC - Abnormal; Notable for the following components:   WBC 3.9 (*)    Hemoglobin 11.2 (*)    All other components within normal limits  D-DIMER, QUANTITATIVE (NOT AT [facility]) - Abnormal; Notable for the following components:   D-Dimer, Quant 0.97 (*)    All other components within normal limits  TROPONIN I (HIGH SENSITIVITY)  TROPONIN I (HIGH SENSITIVITY)    EKG EKG Interpretation  Date/Time:  Wednesday May 05 2019 11:59:05 EDT Ventricular Rate:  70 PR Interval:  168 QRS Duration: 84 QT Interval:  432 QTC Calculation: 466 R Axis:   73 Text Interpretation:  Normal sinus rhythm Cannot rule out Anterior infarct , age undetermined Abnormal ECG similar to prior 8/20 Confirmed by Aletta Edouard [phone]) on 05/05/2019 1:23:40 PM   Radiology Dg Chest 2 View  Result Date: 05/05/2019 CLINICAL DATA:  Chest pain and pressure for 2 weeks EXAM: CHEST - 2 VIEW COMPARISON:  06/16/2018 FINDINGS: The heart size and mediastinal contours are within normal limits. Both lungs are clear. The visualized skeletal structures are unremarkable. IMPRESSION: No active cardiopulmonary disease. Electronically Signed   By: Kerby Moors M.D.   On: 05/05/2019 12:42    Procedures Procedures (including critical care time)  Medications Ordered in ED Medications  sodium chloride flush (NS) 0.9 % injection 3 mL (3 mLs Intravenous Given 05/05/19 1342)     Initial Impression / Assessment and Plan / ED Course  I have reviewed the triage vital signs and the nursing notes.  Pertinent labs & imaging results that were available during my care of the patient were reviewed by me and considered in my medical decision making (see chart for details).         Patient will need CTA of her chest due to the fact she does have an elevated d-dimer.  The patient has 2 sets of negative troponins.  I feel like with the constant chest pain is less likely to be cardiac.  Patient will be reassessed after the CT scan.  Final Clinical Impressions(s) / ED Diagnoses   Final diagnoses:  None    ED Discharge Orders    None       Dalia Heading, PA-C 05/05/19 1616    Hayden Rasmussen, MD 05/05/19 [phone]

## 2019-05-19 DIAGNOSIS — F3181 Bipolar II disorder: Secondary | ICD-10-CM | POA: Diagnosis not present

## 2019-05-21 ENCOUNTER — Encounter: Payer: Self-pay | Admitting: Acute Care

## 2019-05-28 DIAGNOSIS — F3181 Bipolar II disorder: Secondary | ICD-10-CM | POA: Diagnosis not present

## 2019-06-03 ENCOUNTER — Encounter: Payer: Self-pay | Admitting: Orthopaedic Surgery

## 2019-06-03 ENCOUNTER — Encounter (INDEPENDENT_AMBULATORY_CARE_PROVIDER_SITE_OTHER): Payer: Self-pay

## 2019-06-03 ENCOUNTER — Other Ambulatory Visit: Payer: Self-pay | Admitting: Orthopaedic Surgery

## 2019-06-03 DIAGNOSIS — K1329 Other disturbances of oral epithelium, including tongue: Secondary | ICD-10-CM | POA: Diagnosis not present

## 2019-06-03 MED ORDER — FLUCONAZOLE 150 MG PO TABS: 150.0000 mg | ORAL_TABLET | Freq: Once | ORAL | 0 refills | Status: AC

## 2019-06-03 MED ORDER — AMOXICILLIN 500 MG PO TABS: 1000.0000 mg | ORAL_TABLET | Freq: Two times a day (BID) | ORAL | 0 refills | Status: DC

## 2019-06-04 ENCOUNTER — Ambulatory Visit (INDEPENDENT_AMBULATORY_CARE_PROVIDER_SITE_OTHER): Payer: 59 | Admitting: "Endocrinology

## 2019-06-09 ENCOUNTER — Other Ambulatory Visit: Payer: 59

## 2019-06-10 ENCOUNTER — Encounter (INDEPENDENT_AMBULATORY_CARE_PROVIDER_SITE_OTHER): Payer: Self-pay

## 2019-06-10 DIAGNOSIS — F3181 Bipolar II disorder: Secondary | ICD-10-CM | POA: Diagnosis not present

## 2019-06-14 ENCOUNTER — Encounter: Payer: Self-pay | Admitting: Adult Health

## 2019-06-15 ENCOUNTER — Encounter: Payer: Self-pay | Admitting: Adult Health

## 2019-06-15 ENCOUNTER — Ambulatory Visit (INDEPENDENT_AMBULATORY_CARE_PROVIDER_SITE_OTHER)
Admission: RE | Admit: 2019-06-15 | Discharge: 2019-06-15 | Disposition: A | Discharge status: Home / Self Care | Payer: 59 | Source: Ambulatory Visit

## 2019-06-15 ENCOUNTER — Ambulatory Visit: Payer: Self-pay | Admitting: *Deleted

## 2019-06-15 DIAGNOSIS — R21 Rash and other nonspecific skin eruption: Secondary | ICD-10-CM

## 2019-06-15 DIAGNOSIS — L299 Pruritus, unspecified: Secondary | ICD-10-CM

## 2019-06-15 MED ORDER — PREDNISONE 20 MG PO TABS: 20.0000 mg | ORAL_TABLET | Freq: Two times a day (BID) | ORAL | 0 refills | Status: AC

## 2019-06-15 NOTE — Progress Notes (Signed)
Subjective:    Patient ID: Tracy [name], female    DOB: 05/25/1966, 53 y.o.   MRN: [id number]  HPI: [name] presents with several complaints 1) Facial warmth and rash that developed >48 hrs ago. Rash located on bridge of nose and on cheeks. She feels like she has super sensitive skin She was seen by UC via TeleMedicine last night- treated with  Prednisone 20mg  BID with meals x 5 days She has taken two doses thus far and states "I feel like I am going to jump out of my skin". She feels that the rash is r/t to stress- awaiting path results on poss oral cancer.  She denies any new Rx's, new creams, change in laundry detergent, recent travel.  2) Dog bite to L buttock-that occurred 6 days ago. Bite broke the skin. She immediately cleansed area, patted dry, dressed with neosporin and Tegaderm. Dog who bite her is well known to her, is UTD on vaccinations. Pt's tetanus UTD- last given 01/02/2018. She denies fever/malaise/change in appetite. She denies drainage from site. She reports tenderness with area is touched- rated 2/10, described as a dull ache.   3)Oral biopsy on 06/02/2019 for an sore that was present >1 year. Is awaiting biopsy results- has f/u on/around 06/24/2019   4) Strained relationship with her husband- he is "over the road trucker", infrequently home. She continues close f/u with her therapist: bi-weekly sessions. She denies SI/HI  Patient Care Team    Relationship Specialty Notifications Start End  Mina Marble D, NP PCP - General Family Medicine  04/22/19   Bennetta Laos, MD (Inactive)  Obstetrics and Gynecology  07/04/11   Chucky May, MD Attending Physician Psychiatry  07/04/11   Noemi Chapel, NP Nurse Practitioner   11/29/15   Lyndal Pulley, DO Consulting Physician Family Medicine  04/22/19     Patient Active Problem List   Diagnosis Date Noted  . Oral mucosal lesion 06/17/2019  . Facial rash 06/16/2019  . Dog bite of buttock,  initial encounter 06/16/2019  . Healthcare maintenance 04/22/2019  . UTI (urinary tract infection) 04/22/2019  . 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 Unilateral primary osteoarthritis, left hip 02/10/2019  . Greater trochanteric bursitis of left hip 07/24/2018  . Arthritis of left hip 06/25/2018  . Pain in left knee 01/21/2018  . Encounter for general adult medical examination with abnormal findings 01/04/2018  . Vitamin D deficiency 01/04/2018  . Lower back pain 07/18/2017  . Hemorrhoids, internal 07/18/2017  . Trigeminal neuralgia 01/18/2016  . Bipolar disorder ([place]) 11/28/2015  . Borderline personality disorder ([place]) 11/28/2015  . Carpal tunnel syndrome 09/06/2015  . MDD (major depressive disorder) 06/06/2015  . Severe episode of recurrent major depressive disorder, without psychotic features ([place])   . History of posttraumatic stress disorder (PTSD)   . GAD (generalized anxiety disorder)   . Insulin resistance 02/09/2015  . 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 Acanthosis nigricans, acquired 02/09/2015  . Nonalcoholic fatty liver disease 02/09/2015  . Hepatic cirrhosis ([place]) 02/09/2015  . Female hirsutism 02/09/2015  . Oligomenorrhea 02/09/2015  . Infertility associated with anovulation 02/09/2015  . Morbid obesity ([place]) 10/31/2014  . Anxiety and depression 10/31/2014  . Obstructive sleep apnea 06/27/2014  . Diabetes mellitus, type 2 ([place]) 06/10/2013  . 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[place]) 01/01/2012  . Cystic teratoma   . PCOS (polycystic ovarian syndrome)   . Infertility, female   . TOBACCO USE, QUIT 02/08/2010  . HYPOTHYROIDISM, POSTSURGICAL 10/02/2007  . POLYCYSTIC  OVARIAN DISEASE 10/02/2007  . Elevated cholesterol 10/02/2007  . DEPRESSION 10/02/2007  . Migraine 10/02/2007  . Essential hypertension 10/02/2007  . RENAL CALCULUS, HX OF 10/02/2007     Past Medical History:  Diagnosis Date  . Anxiety   . Arthritis    back, knees, right elbow  . Bipolar  disorder ([place])   . 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 Cirrhosis ([place])   . Dental crowns present   . Depression   . Diabetes mellitus without complication ([place])   . GERD (gastroesophageal reflux disease)   . High cholesterol    no current med.  Marland Kitchen History of kidney stones   . History of migraine headaches   . Hypertension    under control with meds., has been on med. x 4 yr.  . Hypothyroidism   . NASH (nonalcoholic steatohepatitis)   . PCOS (polycystic ovarian syndrome)   . PONV (postoperative nausea and vomiting)    also hx. of emergence delirium 2007  . Sleep apnea    uses CPAP nightly  . Trigeminal neuralgia      Past Surgical History:  Procedure Laterality Date  . ACHILLES TENDON SURGERY Left 2007  . APPENDECTOMY    . CARPAL TUNNEL RELEASE Right 01/12/2016   Procedure: RIGHT CARPAL TUNNEL RELEASE;  Surgeon: Roseanne Kaufman, MD;  Location: [facility];  Service: Orthopedics;  Laterality: Right;  . COLONOSCOPY WITH PROPOFOL N/A 04/20/2014   Procedure: COLONOSCOPY WITH PROPOFOL;  Surgeon: Arta Silence, MD;  Location: [facility];  Service: Endoscopy;  Laterality: N/A;  . ESOPHAGOGASTRODUODENOSCOPY (EGD) WITH PROPOFOL N/A 04/20/2014   Procedure: ESOPHAGOGASTRODUODENOSCOPY (EGD) WITH PROPOFOL;  Surgeon: Arta Silence, MD;  Location: [facility];  Service: Endoscopy;  Laterality: N/A;  . EXCISION HAGLUND'S DEFORMITY WITH ACHILLES TENDON REPAIR Right 02/25/2013   Procedure: RIGHT ACHILLES DEBRIDEMENT AND RECONSTRUCTION;  HAGLUND'S EXCISION; GASTROC RECESSION AND FLEXOR HALLUCIS LONGUS TRANSFER;  Surgeon: Wylene Simmer, MD;  Location: [name];  Service: Orthopedics;  Laterality: Right;  . JOINT REPLACEMENT     hip  . LUMBAR LAMINECTOMY     X 3  . neck fusion    . OVARIAN CYST SURGERY Left 1998  . PILONIDAL CYST EXCISION  1994  . RIGHT OOPHORECTOMY    . STERIOD INJECTION Left 01/12/2016   Procedure: STEROID INJECTION LEFT WRIST;  Surgeon: Roseanne Kaufman, MD;   Location: [facility];  Service: Orthopedics;  Laterality: Left;  . TOTAL HIP ARTHROPLASTY Left 03/19/2019   Procedure: LEFT TOTAL HIP ARTHROPLASTY ANTERIOR APPROACH;  Surgeon: Mcarthur Rossetti, MD;  Location: [facility];  Service: Orthopedics;  Laterality: Left;  . TOTAL THYROIDECTOMY  2003  . UPPER GI ENDOSCOPY  01/19/2015     Family History  Problem Relation Age of Onset  . Heart disease Father   . Depression Father   . Heart attack Father   . Cancer Brother        THROAT colon  . Depression Brother   . Anxiety disorder Brother   . Depression Sister   . Breast cancer Mother   . Hypertension Mother   . Depression Maternal Grandmother   . Stroke Maternal Grandfather      Social History   Substance and Sexual Activity  Drug Use No     Social History   Substance and Sexual Activity  Alcohol Use No  . Alcohol/week: 0.0 standard drinks   Comment: Has not had any alcohol since 07/2014 - before this date, she rarely  drink.     Social History   Tobacco Use  Smoking Status Former Smoker  . Packs/day: 0.50  . Years: 15.00  . Pack years: 7.50  . Types: Cigarettes  . Quit date: 02/2019  . Years since quitting: 0.3  Smokeless Tobacco Never Used     Outpatient Encounter Medications as of 06/16/2019  Medication Sig  . cetirizine (ZYRTEC) 10 MG tablet Take 10 mg by mouth daily.  . clonazePAM (KLONOPIN) 1 MG tablet Take 1 mg by mouth 3 (three) times daily.   Marland Kitchen FLUoxetine (PROZAC) 40 MG capsule Take 40 mg by mouth every morning.   . fluticasone (FLONASE) 50 MCG/ACT nasal spray Place 1 spray into both nostrils daily as needed for allergies or rhinitis.  Marland Kitchen lamoTRIgine (LAMICTAL) 200 MG tablet Take 400 mg by mouth every evening.   Marland Kitchen lisinopril-hydrochlorothiazide (ZESTORETIC) 10-12.5 MG tablet TAKE 1 TABLET BY MOUTH DAILY. **NEED VISIT** **NO FURTHER REFILLS UNTIL SEEN** (Patient taking differently: Take 1 tablet by mouth daily. )  . predniSONE (DELTASONE)  20 MG tablet Take 1 tablet (20 mg total) by mouth 2 (two) times daily with a meal for 5 days.  . RABEprazole (ACIPHEX) 20 MG tablet Take one tablet, daily. (Patient taking differently: Take 20 mg by mouth 2 (two) times daily. )  . rizatriptan (MAXALT) 10 MG tablet Take 1 tablet (10 mg total) by mouth as needed for migraine. May repeat in 2 hours if needed  . SYNTHROID 200 MCG tablet Take 300 mcg 3 days a week and 200 mcg 4 days a week. (Patient taking differently: Take 200-300 mcg by mouth See admin instructions. Take 1 1/2 tablet (339mcg) on Sunday and Monday. Take 1 tablet (234mcg) all other days)  . [DISCONTINUED] acetaminophen (TYLENOL) 500 MG tablet Take 500 mg by mouth every 6 (six) hours as needed. Took yesterday for pain 2 tabs  . [DISCONTINUED] amoxicillin (AMOXIL) 500 MG tablet Take 2 tablets (1,000 mg total) by mouth 2 (two) times daily. Take one hour before your procedure and then repeat in 6 hours after your procedure  . [DISCONTINUED] loratadine (CLARITIN) 10 MG tablet Take 10 mg by mouth daily as needed for allergies.  . [DISCONTINUED] ondansetron (ZOFRAN-ODT) 4 MG disintegrating tablet Take 4 mg by mouth every 4 (four) hours as needed for nausea or vomiting.   . [DISCONTINUED] traMADol (ULTRAM) 50 MG tablet Take 1-2 tablets (50-100 mg total) by mouth every 6 (six) hours as needed.   No facility-administered encounter medications on file as of 06/16/2019.     Allergies: Sulfa antibiotics, Bupropion hcl, Codeine, and Sulfamethoxazole-trimethoprim  Body mass index is 37.71 kg/m.  Blood pressure 108/67, pulse 90, temperature 99.1 F (37.3 C), temperature source Oral, height 5\' 11"  (1.803 m), weight 270 lb 6.4 oz (122.7 kg), SpO2 97 %.   Review of Systems  Constitutional: Positive for fatigue. Negative for activity change, appetite change, chills, diaphoresis, fever and unexpected weight change.  HENT: Positive for dental problem.   Eyes: Negative for visual disturbance.   Respiratory: Negative for cough, chest tightness, shortness of breath and wheezing.   Cardiovascular: Negative for chest pain, palpitations and leg swelling.  Endocrine: Negative for polydipsia, polyphagia and polyuria.  Skin: Positive for color change, rash and wound.  Hematological: Negative for adenopathy. Does not bruise/bleed easily.  Psychiatric/Behavioral: Positive for dysphoric mood. Negative for agitation, behavioral problems, confusion, decreased concentration, hallucinations, self-injury, sleep disturbance and suicidal ideas. The patient is nervous/anxious. The patient is not hyperactive.  Objective:   Physical Exam Constitutional:      General: She is not in acute distress.    Appearance: She is obese. She is not ill-appearing, toxic-appearing or diaphoretic.  HENT:     Head: Normocephalic and atraumatic.     Mouth/Throat:     Mouth: Oral lesions present.      Comments: Bottom right mouth- areas of white/lighter pink tissue  Eyes:     Extraocular Movements: Extraocular movements intact.     Conjunctiva/sclera: Conjunctivae normal.     Pupils: Pupils are equal, round, and reactive to light.  Cardiovascular:     Rate and Rhythm: Normal rate and regular rhythm.     Pulses: Normal pulses.     Heart sounds: Normal heart sounds.  Pulmonary:     Effort: Pulmonary effort is normal. No respiratory distress.     Breath sounds: Normal breath sounds. No stridor. No wheezing, rhonchi or rales.  Chest:     Chest wall: No tenderness.  Musculoskeletal:        General: Tenderness and signs of injury present.     Comments: L laterl buttock- Superficial abrasion with healing ecchymosis. No drainage or streaking noted.  Skin:    General: Skin is warm and dry.     Capillary Refill: Capillary refill takes less than 2 seconds.     Findings: Rash present.  Neurological:     Mental Status: She is alert and oriented to person, place, and time.  Psychiatric:        Mood and  Affect: Mood normal.        Behavior: Behavior normal.        Thought Content: Thought content normal.        Judgment: Judgment normal.           Assessment & Plan:   1. Facial rash   2. Dog bite of buttock, initial encounter   3. Anxiety and depression   4. Oral mucosal lesion   5. Essential hypertension     Anxiety and depression Continue close f/u with [facility] team She denies SI/HI  Facial rash Remain off Prednisone. To help reduce itching and rash- recommend morning anti-sedating oral histamine (ie. Claritin, Zyrtec), and if needed OTC Benadryl in the evening. Please keep your follow-up with your Oral Surgeon next week on 06/24/2019. Increase water intake, follow heart healthy diet. Continue close follow-up with mental health care team. Please call clinic with any questions/concerns.  Dog bite of buttock, initial encounter Dog bite that occurred 6 days ago. Cleansed area, patted dry, dressed with neosporin and Tegaderm. Dog who bite her is UTD on vaccination Tetanus UTD- last given 01/02/2018  Oral mucosal lesion 3)Oral biopsy on 06/02/2019 for an sore that was present >1 year. Is awaiting biopsy results- has f/u on/around 06/24/2019   Essential hypertension BP at goal 108/67, HR 90 She denies acute cardiac sx's    FOLLOW-UP:  Return if symptoms worsen or fail to improve.

## 2019-06-15 NOTE — Telephone Encounter (Signed)
Patient calling with complaints of a rash that developed on yesterday. Rash is red in color and is present on the face, lips, cheeks, forehead and goes down the neck to the top of chest and arms. No swelling just hot to touch. Complaints of itching after taking 50 mg of Benadryl and Zyrtec. Pt also has complaints of a mild headache and burning eyes. Pt was on penicillin within the past 2 weeks but the course of antibiotics has been completed.Pt contacted PCP and has an appt scheduled for tomorrow for a dog bite. Pt advised to be seen at Urgent Care or to have a virtual visit. Understanding verbalized.   Reason for Disposition . SEVERE itching (i.e., interferes with sleep, normal activities or school)  Answer Assessment - Initial Assessment Questions 1. APPEARANCE of RASH: "Describe the rash." (e.g., spots, blisters, raised areas, skin peeling, scaly)     Red rash to fact that started on yesterday 2. SIZE: "How big are the spots?" (e.g., tip of pen, eraser, coin; inches, centimeters)     Face,  3. LOCATION: "Where is the rash located?"     Face , lips , cheeks, forehead, down neck and top of chest and arms 4. COLOR: "What color is the rash?" (Note: It is difficult to assess rash color in people with darker-colored skin. When this situation occurs, simply ask the caller to describe what they see.)     red 5. ONSET: "When did the rash begin?"     yesterday 6. FEVER: "Do you have a fever?" If so, ask: "What is your temperature, how was it measured, and when did it start?"     No 7. ITCHING: "Does the rash itch?" If so, ask: "How bad is the itch?" (Scale 1-10; or mild, moderate, severe)     Yes  8. CAUSE: "What do you think is causing the rash?"     unknown 9. MEDICATION FACTORS: "Have you started any new medications within the last 2 weeks?" (e.g., antibiotics)      Recently on Penicillin 10. OTHER SYMPTOMS: "Do you have any other symptoms?" (e.g., dizziness, headache, sore throat, joint pain)      Mild headache 11. PREGNANCY: "Is there any chance you are pregnant?" "When was your last menstrual period?"       n/a  Protocols used: RASH OR REDNESS - [facility]

## 2019-06-15 NOTE — Discharge Instructions (Signed)
Unable to rule out life-threatening rash such as erythema multiforme, steven johnsons via video visit.  Recommended further evaluation and management in the ED.  Patient declines at this time.  Would like to try outpatient therapy first.  Aware of risk associated with this decision including missed diagnosis, organ damage/ failure, and/or death.  Patient aware and would like to proceed with outpatient therapy Prednisone prescribed.  Take as directed and to completion Continue with benadryl as needed for symptomatic relief Follow up with PCP tomorrow for recheck and to ensure your symptoms Follow up in person or go to the ED if you have any new or worsening symptoms such as worsening rash, difficulty breathing, SOB, chest pain, nausea, vomiting, throat tightness or swelling, tongue/lip swelling or tingling, abdominal pain, changes in bowel or bladder habits, oral or genital sores, etc..Marland Kitchen

## 2019-06-15 NOTE — ED Provider Notes (Signed)
[place]     Virtual Visit via Video Note:  Tracy [name]  initiated request for Telemedicine visit with [facility] team. I connected with Tracy [name]  on 06/15/2019 at 1:26 PM  for a synchronized telemedicine visit using a video enabled HIPPA compliant telemedicine application. I verified that I am speaking with Tracy [name]  using two identifiers. Lestine Box, PA-C  was physically located in a [facility] Urgent care site and Kristian Mixer was located at a different location.   The limitations of evaluation and management by telemedicine as well as the availability of in-person appointments were discussed. Patient was informed that she  may incur a bill ( including co-pay) for this virtual visit encounter. Tracy [name]  expressed understanding and gave verbal consent to proceed with virtual visit.   ID:[id number] 06/15/19 Arrival Time: [id number]  CC: Rash and itching  SUBJECTIVE:  Tracy [name] is a 53 y.o. female who presents with rash and itching x 1 day.  Denies precipitating event or trauma.  Denies changes in soaps, detergents, close contacts with similar rash, known trigger or allergy. Denies medication change or starting a new medication recently.  States she was taking penicillin last week following a dog bite, but discontinued that approximately 5 days ago.  Does take lamictal daily for the past several years.  Localizes the rash to face and neck.  Describes it as red and itchy.  Has tried OTC benadryl and zyrtec without relief.  Denies aggravating factors.  Denies similar symptoms in the past.  Complains of body aches, eyes burning, and scratchy throat.  Denies fever, chills, rhinorrhea, congestion, nausea, vomiting, swelling, discharge, oral or genital sores/ lesions, SOB, chest pain, abdominal pain, changes in bowel or bladder function.    Has oral biopsy for sore in mouth x 1 year,  was told it was concerning for oral cancer.  Is awaiting biopsy results  Patient is a triage nurse.    ROS: As per HPI.  All other pertinent ROS negative.     Past Medical History:  Diagnosis Date  . Anxiety   . Arthritis    back, knees, right elbow  . Bipolar disorder ([place])   . Cirrhosis ([place])   . Dental crowns present   . Depression   . Diabetes mellitus without complication ([place])   . GERD (gastroesophageal reflux disease)   . High cholesterol    no current med.  Marland Kitchen History of kidney stones   . History of migraine headaches   . Hypertension    under control with meds., has been on med. x 4 yr.  . Hypothyroidism   . NASH (nonalcoholic steatohepatitis)   . PCOS (polycystic ovarian syndrome)   . PONV (postoperative nausea and vomiting)    also hx. of emergence delirium 2007  . Sleep apnea    uses CPAP nightly  . Trigeminal neuralgia    Past Surgical History:  Procedure Laterality Date  . ACHILLES TENDON SURGERY Left 2007  . APPENDECTOMY    . CARPAL TUNNEL RELEASE Right 01/12/2016   Procedure: RIGHT CARPAL TUNNEL RELEASE;  Surgeon: Roseanne Kaufman, MD;  Location: [place];  Service: Orthopedics;  Laterality: Right;  . COLONOSCOPY WITH PROPOFOL N/A 04/20/2014   Procedure: COLONOSCOPY WITH PROPOFOL;  Surgeon: Arta Silence, MD;  [facility];  Service: Endoscopy;  Laterality: N/A;  . ESOPHAGOGASTRODUODENOSCOPY (EGD) WITH PROPOFOL N/A 04/20/2014   Procedure: ESOPHAGOGASTRODUODENOSCOPY (EGD) WITH PROPOFOL;  Surgeon: Gwyndolyn Saxon  Paulita Fujita, MD;  [facility];  Service: Endoscopy;  Laterality: N/A;  . EXCISION HAGLUND'S DEFORMITY WITH ACHILLES TENDON REPAIR Right 02/25/2013   Procedure: RIGHT ACHILLES DEBRIDEMENT AND RECONSTRUCTION;  HAGLUND'S EXCISION; GASTROC RECESSION AND FLEXOR HALLUCIS LONGUS TRANSFER;  Surgeon: Wylene Simmer, MD;  Location: [place];  Service: Orthopedics;  Laterality: Right;  . LUMBAR LAMINECTOMY     X 3  . neck  fusion    . OVARIAN CYST SURGERY Left 1998  . PILONIDAL CYST EXCISION  1994  . RIGHT OOPHORECTOMY    . STERIOD INJECTION Left 01/12/2016   Procedure: STEROID INJECTION LEFT WRIST;  Surgeon: Roseanne Kaufman, MD;  Location: [place];  Service: Orthopedics;  Laterality: Left;  . TOTAL HIP ARTHROPLASTY Left 03/19/2019   Procedure: LEFT TOTAL HIP ARTHROPLASTY ANTERIOR APPROACH;  Surgeon: Mcarthur Rossetti, MD;  [facility];  Service: Orthopedics;  Laterality: Left;  . TOTAL THYROIDECTOMY  2003  . UPPER GI ENDOSCOPY  01/19/2015   Allergies  Allergen Reactions  . Sulfa Antibiotics Itching, Swelling and Rash  . Bupropion Hcl Itching and Rash  . Codeine Itching  . Sulfamethoxazole-Trimethoprim Rash   No current facility-administered medications on file prior to encounter.    Current Outpatient Medications on File Prior to Encounter  Medication Sig Dispense Refill  . acetaminophen (TYLENOL) 500 MG tablet Take 500 mg by mouth every 6 (six) hours as needed. Took yesterday for pain 2 tabs    . amoxicillin (AMOXIL) 500 MG tablet Take 2 tablets (1,000 mg total) by mouth 2 (two) times daily. Take one hour before your procedure and then repeat in 6 hours after your procedure 4 tablet 0  . clonazePAM (KLONOPIN) 1 MG tablet Take 1 mg by mouth 3 (three) times daily.   5  . FLUoxetine (PROZAC) 40 MG capsule Take 40 mg by mouth every morning.   0  . fluticasone (FLONASE) 50 MCG/ACT nasal spray Place 1 spray into both nostrils daily as needed for allergies or rhinitis.    Marland Kitchen lamoTRIgine (LAMICTAL) 200 MG tablet Take 400 mg by mouth every evening.   1  . lisinopril-hydrochlorothiazide (ZESTORETIC) 10-12.5 MG tablet TAKE 1 TABLET BY MOUTH DAILY. **NEED VISIT** **NO FURTHER REFILLS UNTIL SEEN** (Patient taking differently: Take 1 tablet by mouth daily. ) 90 tablet 1  . loratadine (CLARITIN) 10 MG tablet Take 10 mg by mouth daily as needed for allergies.    Marland Kitchen ondansetron (ZOFRAN-ODT) 4 MG  disintegrating tablet Take 4 mg by mouth every 4 (four) hours as needed for nausea or vomiting.     . RABEprazole (ACIPHEX) 20 MG tablet Take one tablet, daily. (Patient taking differently: Take 20 mg by mouth 2 (two) times daily. ) 90 tablet 1  . rizatriptan (MAXALT) 10 MG tablet Take 1 tablet (10 mg total) by mouth as needed for migraine. May repeat in 2 hours if needed 10 tablet 0  . SYNTHROID 200 MCG tablet Take 300 mcg 3 days a week and 200 mcg 4 days a week. (Patient taking differently: Take 200-300 mcg by mouth See admin instructions. Take 1 1/2 tablet (354mcg) on Sunday and Monday. Take 1 tablet (244mcg) all other days) 135 tablet 1  . traMADol (ULTRAM) 50 MG tablet Take 1-2 tablets (50-100 mg total) by mouth every 6 (six) hours as needed. 40 tablet 0    OBJECTIVE: There were no vitals filed for this visit.  General appearance: alert; no distress Eyes: EOMI grossly HENT: normocephalic; atraumatic Neck: supple  with FROM Lungs: normal respiratory effort; speaking in full sentences without difficulty Extremities: moves extremities without difficulty Skin: erythematous macular rash localized to face and neck, blanches with pressure, no obvious open sores or lesions (see picture below) Neurologic: normal facial expressions Psychological: alert and cooperative; normal mood and affect    ASSESSMENT & PLAN:  1. Rash and nonspecific skin eruption   2. Itching     Meds ordered this encounter  Medications  . predniSONE (DELTASONE) 20 MG tablet    Sig: Take 1 tablet (20 mg total) by mouth 2 (two) times daily with a meal for 5 days.    Dispense:  10 tablet    Refill:  0    Order Specific Question:   Supervising Provider    Answer:   Raylene Everts [id number]   Unable to rule out life-threatening rash such as erythema multiforme, steven johnsons via video visit.  Recommended further evaluation and management in the ED.  Patient declines at this time.  Would like to try outpatient  therapy first.  Aware of risk associated with this decision including missed diagnosis, organ damage/ failure, and/or death.  Patient aware and would like to proceed with outpatient therapy Prednisone prescribed.  Take as directed and to completion Continue with benadryl as needed for symptomatic relief Follow up with PCP tomorrow for recheck and to ensure your symptoms Follow up in person or go to the ED if you have any new or worsening symptoms such as worsening rash, difficulty breathing, SOB, chest pain, nausea, vomiting, throat tightness or swelling, tongue/lip swelling or tingling, abdominal pain, changes in bowel or bladder habits, oral or genital sores, etc...   I discussed the assessment and treatment plan with the patient. The patient was provided an opportunity to ask questions and all were answered. The patient agreed with the plan and demonstrated an understanding of the instructions.   The patient was advised to call back or seek an in-person evaluation if the symptoms worsen or if the condition fails to improve as anticipated.  I provided 15 minutes of non-face-to-face time during this encounter.  Lestine Box, PA-C  06/15/2019 1:26 PM    Lestine Box, PA-C 06/15/19 1327

## 2019-06-16 ENCOUNTER — Encounter: Payer: Self-pay | Admitting: Adult Health

## 2019-06-16 ENCOUNTER — Ambulatory Visit (INDEPENDENT_AMBULATORY_CARE_PROVIDER_SITE_OTHER): Payer: 59 | Admitting: Adult Health

## 2019-06-16 ENCOUNTER — Other Ambulatory Visit: Payer: Self-pay

## 2019-06-16 ENCOUNTER — Encounter: Payer: 59 | Admitting: Adult Health

## 2019-06-16 DIAGNOSIS — K1370 Unspecified lesions of oral mucosa: Secondary | ICD-10-CM

## 2019-06-16 DIAGNOSIS — F419 Anxiety disorder, unspecified: Secondary | ICD-10-CM

## 2019-06-16 DIAGNOSIS — I10 Essential (primary) hypertension: Secondary | ICD-10-CM

## 2019-06-16 DIAGNOSIS — R21 Rash and other nonspecific skin eruption: Secondary | ICD-10-CM | POA: Insufficient documentation

## 2019-06-16 DIAGNOSIS — F32A Depression, unspecified: Secondary | ICD-10-CM

## 2019-06-16 DIAGNOSIS — S31805A Open bite of unspecified buttock, initial encounter: Secondary | ICD-10-CM | POA: Insufficient documentation

## 2019-06-16 DIAGNOSIS — F329 Major depressive disorder, single episode, unspecified: Secondary | ICD-10-CM | POA: Diagnosis not present

## 2019-06-16 DIAGNOSIS — W540XXA Bitten by dog, initial encounter: Secondary | ICD-10-CM | POA: Insufficient documentation

## 2019-06-16 HISTORY — DX: Rash and other nonspecific skin eruption: R21

## 2019-06-16 NOTE — Patient Instructions (Signed)

## 2019-06-17 DIAGNOSIS — K1370 Unspecified lesions of oral mucosa: Secondary | ICD-10-CM | POA: Insufficient documentation

## 2019-06-17 HISTORY — DX: Unspecified lesions of oral mucosa: K13.70

## 2019-06-17 NOTE — Assessment & Plan Note (Signed)
Dog bite that occurred 6 days ago. Cleansed area, patted dry, dressed with neosporin and Tegaderm. Dog who bite her is UTD on vaccination Tetanus UTD- last given 01/02/2018

## 2019-06-17 NOTE — Assessment & Plan Note (Signed)
Continue close f/u with [facility] team She denies SI/HI

## 2019-06-17 NOTE — Assessment & Plan Note (Signed)
Remain off Prednisone. To help reduce itching and rash- recommend morning anti-sedating oral histamine (ie. Claritin, Zyrtec), and if needed OTC Benadryl in the evening. Please keep your follow-up with your Oral Surgeon next week on 06/24/2019. Increase water intake, follow heart healthy diet. Continue close follow-up with mental health care team. Please call clinic with any questions/concerns.

## 2019-06-17 NOTE — Assessment & Plan Note (Signed)
3)Oral biopsy on 06/02/2019 for an sore that was present >1 year. Is awaiting biopsy results- has f/u on/around 06/24/2019

## 2019-06-17 NOTE — Assessment & Plan Note (Signed)
BP at goal 108/67, HR 90 She denies acute cardiac sx's

## 2019-06-18 DIAGNOSIS — F3181 Bipolar II disorder: Secondary | ICD-10-CM | POA: Diagnosis not present

## 2019-07-06 DIAGNOSIS — K121 Other forms of stomatitis: Secondary | ICD-10-CM | POA: Diagnosis not present

## 2019-07-06 DIAGNOSIS — R220 Localized swelling, mass and lump, head: Secondary | ICD-10-CM | POA: Diagnosis not present

## 2019-07-13 DIAGNOSIS — R220 Localized swelling, mass and lump, head: Secondary | ICD-10-CM | POA: Diagnosis not present

## 2019-07-15 DIAGNOSIS — K121 Other forms of stomatitis: Secondary | ICD-10-CM | POA: Diagnosis not present

## 2019-07-22 DIAGNOSIS — F3181 Bipolar II disorder: Secondary | ICD-10-CM | POA: Diagnosis not present

## 2019-07-23 DIAGNOSIS — G4733 Obstructive sleep apnea (adult) (pediatric): Secondary | ICD-10-CM | POA: Diagnosis not present

## 2019-08-03 DIAGNOSIS — R220 Localized swelling, mass and lump, head: Secondary | ICD-10-CM | POA: Diagnosis not present

## 2019-08-03 DIAGNOSIS — K1379 Other lesions of oral mucosa: Secondary | ICD-10-CM | POA: Diagnosis not present

## 2019-08-18 DIAGNOSIS — F3181 Bipolar II disorder: Secondary | ICD-10-CM | POA: Diagnosis not present

## 2019-08-25 ENCOUNTER — Encounter: Payer: Self-pay | Admitting: Emergency Medicine

## 2019-08-25 ENCOUNTER — Ambulatory Visit: Payer: 59

## 2019-08-25 ENCOUNTER — Ambulatory Visit
Admission: EM | Admit: 2019-08-25 | Discharge: 2019-08-25 | Disposition: A | Discharge status: Home / Self Care | Payer: 59

## 2019-08-25 ENCOUNTER — Telehealth: Payer: 59

## 2019-08-25 DIAGNOSIS — M25512 Pain in left shoulder: Secondary | ICD-10-CM

## 2019-08-25 DIAGNOSIS — W228XXA Striking against or struck by other objects, initial encounter: Secondary | ICD-10-CM | POA: Diagnosis not present

## 2019-08-25 DIAGNOSIS — M62838 Other muscle spasm: Secondary | ICD-10-CM | POA: Diagnosis not present

## 2019-08-25 DIAGNOSIS — W06XXXA Fall from bed, initial encounter: Secondary | ICD-10-CM

## 2019-08-25 NOTE — ED Triage Notes (Signed)
Pt states rolled out of bed on Sunday and landed on lt side. Pt c/o lt shoulder pain that has worsen in pain with limited use and having muscle spasms to lt upper arm.

## 2019-08-25 NOTE — Discharge Instructions (Addendum)
Recommend icing more frequently throughout the day for 10 minutes at a time.   May continue using ibuprofen, Tylenol, muscle relaxer as needed: May take up to 800 mg of ibuprofen 3 times daily. Important to remember not to drink/drive and using muscle relaxer. For persistent, worsening symptoms, recommend you follow-up with your orthopedist as imaging may be indicated at that time.

## 2019-08-25 NOTE — ED Provider Notes (Signed)
[name]    CSN: [id number] Arrival date & time: 08/25/19  1037      History   Chief Complaint Chief Complaint  Patient presents with  . Shoulder Injury    HPI Tracy [name] is a 54 y.o. female with history of obesity, diabetes, arthritis presenting for acute left shoulder pain.  Patient states that she fell from her bed on Sunday, landed on her left side hitting her hip, shoulder.  States her shoulder hit up against her nightstand, has a small bruise which is resolving.  Patient states that pain has worsened over the past few days, she is pain lifting her arm.  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  Patient has taken ibuprofen, Tylenol, muscle relaxer with moderate control of symptoms.  Patient does have history of carpal tunnel on affected side: No change from her baseline.  Patient underwent left hip replacement in August: Has done well since.  No hip, neck pain or decreased range of motion in affected joints today.  Patient largely seeking evaluation of left shoulder.  Bed height approximately 3 feet from ground.   Past Medical History:  Diagnosis Date  . Anxiety   . Arthritis    back, knees, right elbow  . Bipolar disorder ([place])   . Cancer ([place])    oral  . Cirrhosis ([place])   . Dental crowns present   . Depression   . Diabetes mellitus without complication ([place])   . GERD (gastroesophageal reflux disease)   . High cholesterol    no current med.  Marland Kitchen History of kidney stones   . History of migraine headaches   . Hypertension    under control with meds., has been on med. x 4 yr.  . Hypothyroidism   . NASH (nonalcoholic steatohepatitis)   . PCOS (polycystic ovarian syndrome)   . PONV (postoperative nausea and vomiting)    also hx. of emergence delirium 2007  . Sleep apnea    uses CPAP nightly  . Trigeminal neuralgia     Patient Active Problem List   Diagnosis Date Noted  .  Oral mucosal lesion 06/17/2019  . Facial rash 06/16/2019  . Dog bite of buttock, initial encounter 06/16/2019  . Healthcare maintenance 04/22/2019  . UTI (urinary tract infection) 04/22/2019  . 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 Unilateral primary osteoarthritis, left hip 02/10/2019  . Greater trochanteric bursitis of left hip 07/24/2018  . Arthritis of left hip 06/25/2018  . Pain in left knee 01/21/2018  . Encounter for general adult medical examination with abnormal findings 01/04/2018  . Vitamin D deficiency 01/04/2018  . Lower back pain 07/18/2017  . Hemorrhoids, internal 07/18/2017  . Trigeminal neuralgia 01/18/2016  . Bipolar disorder ([place]) 11/28/2015  . Borderline personality disorder ([place]) 11/28/2015  . Carpal tunnel syndrome 09/06/2015  . MDD (major depressive disorder) 06/06/2015  . Severe episode of recurrent major depressive disorder, without psychotic features ([place])   . History of posttraumatic stress disorder (PTSD)   . GAD (generalized anxiety disorder)   . Insulin resistance 02/09/2015  . 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 Acanthosis nigricans, acquired 02/09/2015  . Nonalcoholic fatty liver disease 02/09/2015  . Hepatic cirrhosis ([place]) 02/09/2015  . Female hirsutism 02/09/2015  . Oligomenorrhea 02/09/2015  . Infertility associated with anovulation 02/09/2015  . Morbid obesity ([place]) 10/31/2014  . Anxiety and depression 10/31/2014  . Obstructive sleep apnea 06/27/2014  .  Diabetes mellitus, type 2 ([place]) 06/10/2013  . 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 Cystic teratoma   . PCOS (polycystic ovarian syndrome)   . Infertility, female   . TOBACCO USE, QUIT 02/08/2010  . HYPOTHYROIDISM, POSTSURGICAL 10/02/2007  . POLYCYSTIC OVARIAN DISEASE 10/02/2007  . Elevated cholesterol 10/02/2007  . DEPRESSION 10/02/2007  . Migraine 10/02/2007  . Essential hypertension 10/02/2007  . RENAL CALCULUS, HX OF 10/02/2007    Past Surgical History:   Procedure Laterality Date  . ACHILLES TENDON SURGERY Left 2007  . APPENDECTOMY    . CARPAL TUNNEL RELEASE Right 01/12/2016   Procedure: RIGHT CARPAL TUNNEL RELEASE;  Surgeon: Roseanne Kaufman, MD;  Location: [place];  Service: Orthopedics;  Laterality: Right;  . COLONOSCOPY WITH PROPOFOL N/A 04/20/2014   Procedure: COLONOSCOPY WITH PROPOFOL;  Surgeon: Arta Silence, MD;  [facility];  Service: Endoscopy;  Laterality: N/A;  . ESOPHAGOGASTRODUODENOSCOPY (EGD) WITH PROPOFOL N/A 04/20/2014   Procedure: ESOPHAGOGASTRODUODENOSCOPY (EGD) WITH PROPOFOL;  Surgeon: Arta Silence, MD;  [facility];  Service: Endoscopy;  Laterality: N/A;  . EXCISION HAGLUND'S DEFORMITY WITH ACHILLES TENDON REPAIR Right 02/25/2013   Procedure: RIGHT ACHILLES DEBRIDEMENT AND RECONSTRUCTION;  HAGLUND'S EXCISION; GASTROC RECESSION AND FLEXOR HALLUCIS LONGUS TRANSFER;  Surgeon: Wylene Simmer, MD;  Location: [place];  Service: Orthopedics;  Laterality: Right;  . JOINT REPLACEMENT     hip  . LUMBAR LAMINECTOMY     X 3  . neck fusion    . OVARIAN CYST SURGERY Left 1998  . PILONIDAL CYST EXCISION  1994  . RIGHT OOPHORECTOMY    . STERIOD INJECTION Left 01/12/2016   Procedure: STEROID INJECTION LEFT WRIST;  Surgeon: Roseanne Kaufman, MD;  Location: [place];  Service: Orthopedics;  Laterality: Left;  . TOTAL HIP ARTHROPLASTY Left 03/19/2019   Procedure: LEFT TOTAL HIP ARTHROPLASTY ANTERIOR APPROACH;  Surgeon: Mcarthur Rossetti, MD;  [facility];  Service: Orthopedics;  Laterality: Left;  . TOTAL THYROIDECTOMY  2003  . UPPER GI ENDOSCOPY  01/19/2015    OB History    Gravida  0   Para      Term      Preterm      AB      Living        SAB      TAB      Ectopic      Multiple      Live Births               Home Medications    Prior to Admission medications   Medication Sig Start Date End Date Taking? Authorizing Provider   cetirizine (ZYRTEC) 10 MG tablet Take 10 mg by mouth daily.    [provider]  clonazePAM (KLONOPIN) 1 MG tablet Take 1 mg by mouth 3 (three) times daily.  02/03/17   [provider]  FLUoxetine (PROZAC) 40 MG capsule Take 40 mg by mouth every morning.  06/15/18   [provider]  fluticasone (FLONASE) 50 MCG/ACT nasal spray Place 1 spray into both nostrils daily as needed for allergies or rhinitis.    [provider]  lamoTRIgine (LAMICTAL) 200 MG tablet Take 400 mg by mouth every evening.  02/26/18   [provider]  lisinopril-hydrochlorothiazide (ZESTORETIC) 10-12.5 MG tablet TAKE 1 TABLET BY MOUTH DAILY. **NEED VISIT** **NO FURTHER REFILLS UNTIL SEEN** Patient taking differently: Take 1 tablet by mouth daily.  03/07/19   Plotnikov, Evie Lacks, MD  RABEprazole (ACIPHEX)  20 MG tablet Take one tablet, daily. Patient taking differently: Take 20 mg by mouth 2 (two) times daily.  10/28/18 10/28/19  [name], MD  rizatriptan (MAXALT) 10 MG tablet Take 1 tablet (10 mg total) by mouth as needed for migraine. May repeat in 2 hours if needed 12/23/17   Binnie Rail, MD  SYNTHROID 200 MCG tablet Take 300 mcg 3 days a week and 200 mcg 4 days a week. Patient taking differently: Take 200-300 mcg by mouth See admin instructions. Take 1 1/2 tablet (36mcg) on Sunday and Monday. Take 1 tablet (266mcg) all other days 04/29/19   [name], MD    Family History Family History  Problem Relation Age of Onset  . Heart disease Father   . Depression Father   . Heart attack Father   . Cancer Brother        THROAT colon  . Depression Brother   . Anxiety disorder Brother   . Depression Sister   . Breast cancer Mother   . Hypertension Mother   . Depression Maternal Grandmother   . Stroke Maternal Grandfather     Social History Social History   Tobacco Use  . Smoking status: Former Smoker    Packs/day: 0.50    Years: 15.00    Pack years: 7.50     Types: Cigarettes    Quit date: 02/2019    Years since quitting: 0.5  . Smokeless tobacco: Never Used  Substance Use Topics  . Alcohol use: No    Alcohol/week: 0.0 standard drinks    Comment: Has not had any alcohol since 07/2014 - before this date, she rarely drink.  . Drug use: No     Allergies   Sulfa antibiotics, Bupropion hcl, Codeine, and Sulfamethoxazole-trimethoprim   Review of Systems As per HPI   Physical Exam Triage Vital Signs ED Triage Vitals  Enc Vitals Group     BP      Pulse      Resp      Temp      Temp src      SpO2      Weight      Height      Head Circumference      Peak Flow      Pain Score      Pain Loc      Pain Edu?      Excl. in [place]?    No data found.  Updated Vital Signs BP 128/81 (BP Location: Left Arm)   Pulse 77   Temp 98.2 F (36.8 C) (Oral)   Resp 18   SpO2 96%   Visual Acuity Right Eye Distance:   Left Eye Distance:   Bilateral Distance:    Right Eye Near:   Left Eye Near:    Bilateral Near:     Physical Exam Constitutional:      General: She is not in acute distress.    Appearance: She is obese. She is not ill-appearing.  HENT:     Head: Normocephalic and atraumatic.  Eyes:     General: No scleral icterus.    Pupils: Pupils are equal, round, and reactive to light.  Cardiovascular:     Rate and Rhythm: Normal rate.  Pulmonary:     Effort: Pulmonary effort is normal.  Musculoskeletal:     Left shoulder: Tenderness present. No deformity, effusion, laceration, bony tenderness or crepitus. Decreased range of motion. Decreased strength.     Cervical back:  Normal range of motion. No tenderness.     Comments: Patient has painful abduction, though full active ROM.  Neurovascularly intact.  Mild edema to deltoid without fluctuance, warmth, wound.  Patient has scant yellowish bruise that is resolving inferior to area of pain.  Decreased abduction strength, otherwise 5/5 as compared to right.  Upper extremities  neurovascularly intact bilaterally.  Skin:    Coloration: Skin is not jaundiced or pale.  Neurological:     Mental Status: She is alert and oriented to person, place, and time.      UC Treatments / Results  Labs (all labs ordered are listed, but only abnormal results are displayed) Labs Reviewed - No data to display  EKG   Radiology No results found.  Procedures Procedures (including critical care time)  Medications Ordered in UC Medications - No data to display  Initial Impression / Assessment and Plan / UC Course  I have reviewed the triage vital signs and the nursing notes.  Pertinent labs & imaging results that were available during my care of the patient were reviewed by me and considered in my medical decision making (see chart for details).     Patient appears well: No obvious deformity, bony tenderness.  Low concern for frozen shoulder, dislocation, fracture at this time.  Shared decision making with patient resulting in deferral of radiography.  Will add rice to current therapy, increase frequency of muscle relaxer use for associated spasms, and increase ibuprofen dosing to 3 times daily as opposed to twice daily.  Patient has orthopedic provider: We will follow up with them in 1 week for persistent, worsening symptoms.  We will also do exercises once daily is provided.  Return precautions discussed, patient verbalized understanding and is agreeable to plan. Final Clinical Impressions(s) / UC Diagnoses   Final diagnoses:  Acute pain of left shoulder  Muscle spasm of left shoulder area     Discharge Instructions     Recommend icing more frequently throughout the day for 10 minutes at a time.   May continue using ibuprofen, Tylenol, muscle relaxer as needed: May take up to 800 mg of ibuprofen 3 times daily. Important to remember not to drink/drive and using muscle relaxer. For persistent, worsening symptoms, recommend you follow-up with your orthopedist as  imaging may be indicated at that time.    ED Prescriptions    None     PDMP not reviewed this encounter.   Hall-Potvin, [place], [place] 08/25/19 1138

## 2019-08-30 ENCOUNTER — Other Ambulatory Visit: Payer: Self-pay | Admitting: Adult Health

## 2019-08-30 ENCOUNTER — Other Ambulatory Visit: Payer: Self-pay | Admitting: "Endocrinology

## 2019-08-30 ENCOUNTER — Other Ambulatory Visit: Payer: Self-pay | Admitting: Internal Medicine

## 2019-09-01 DIAGNOSIS — F3181 Bipolar II disorder: Secondary | ICD-10-CM | POA: Diagnosis not present

## 2019-09-03 ENCOUNTER — Ambulatory Visit: Payer: 59 | Attending: Internal Medicine

## 2019-09-03 DIAGNOSIS — Z20822 Contact with and (suspected) exposure to covid-19: Secondary | ICD-10-CM

## 2019-09-04 ENCOUNTER — Ambulatory Visit (INDEPENDENT_AMBULATORY_CARE_PROVIDER_SITE_OTHER)
Admission: RE | Admit: 2019-09-04 | Discharge: 2019-09-04 | Disposition: A | Discharge status: Home / Self Care | Payer: 59 | Source: Ambulatory Visit

## 2019-09-04 ENCOUNTER — Encounter: Payer: Self-pay | Admitting: Adult Health

## 2019-09-04 DIAGNOSIS — G43809 Other migraine, not intractable, without status migrainosus: Secondary | ICD-10-CM | POA: Diagnosis not present

## 2019-09-04 LAB — NOVEL CORONAVIRUS, NAA: SARS-CoV-2, NAA: NOT DETECTED

## 2019-09-04 MED ORDER — BUTALBITAL-APAP-CAFFEINE 50-325-40 MG PO TABS: 1.0000 | ORAL_TABLET | Freq: Four times a day (QID) | ORAL | 0 refills | Status: DC | PRN

## 2019-09-04 NOTE — ED Provider Notes (Signed)
Virtual Visit via Video Note:  [name]  initiated request for Telemedicine visit with [facility] team. I connected with [name]  on 09/04/2019 at 11:03 AM  for a synchronized telemedicine visit using a video enabled HIPPA compliant telemedicine application. I verified that I am speaking with [name]  using two identifiers. Zigmund Gottron, NP  was physically located in a [facility]gent care site and Naje Pulkrabek was located at a different location.   The limitations of evaluation and management by telemedicine as well as the availability of in-person appointments were discussed. Patient was informed that she  may incur a bill ( including co-pay) for this virtual visit encounter. [name]  expressed understanding and gave verbal consent to proceed with virtual visit.     History of Present Illness:Tracy [name]  is a 54 y.o. female presents with complaints of headache.  First noted approximately 1/24.  Worsening. Pressure to sinuses but no congestion or drainage. Tested for covid yesterday and still pending. Has tried ibuprofen 800mg , tylenol, and maxalt. Has headache's in past, feels similar but typically doesn't last this long and typically maxalt does help. Occasional cough. No fever. No sore throat. No gi symptoms. Fatigue. Light and sound sensitivity. No dizziness. No vision changes. No nausea or vomiting. No known ill contacts.   Past Medical History:  Diagnosis Date  . Anxiety   . Arthritis    back, knees, right elbow  . Bipolar disorder ([place])   . 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 Cirrhosis ([place])   . Dental crowns present   . Depression   . Diabetes mellitus without complication ([place])   . GERD (gastroesophageal reflux disease)   . High cholesterol    no current med.  Marland Kitchen History of kidney stones   . History of migraine headaches   . Hypertension    under control with meds., has been  on med. x 4 yr.  . Hypothyroidism   . NASH (nonalcoholic steatohepatitis)   . PCOS (polycystic ovarian syndrome)   . PONV (postoperative nausea and vomiting)    also hx. of emergence delirium 2007  . Sleep apnea    uses CPAP nightly  . Trigeminal neuralgia     Allergies  Allergen Reactions  . Sulfa Antibiotics Itching, Swelling and Rash  . Bupropion Hcl Itching and Rash  . Codeine Itching  . Sulfamethoxazole-Trimethoprim Rash        Observations/Objective: Alert, oriented, non toxic in appearance. Clear coherent speech without difficulty. No increased work of breathing visualized.  No confusion or disorientation. Eyes tracking appropriately  Assessment and Plan: Discussed migraine headache treatment plans. Patient would like to try to treat at home. She has zofran at home already, she would prefer to not yet go to the pharmacy- will try combo of ibuprofen, tylenol, zofran, benadryl and rest. fioricet provided if needed. Encouraged to be seen in person if no improvement. Patient verbalized understanding and agreeable to plan.     Follow Up Instructions:    I discussed the assessment and treatment plan with the patient. The patient was provided an opportunity to ask questions and all were answered. The patient agreed with the plan and demonstrated an understanding of the instructions.   The patient was advised to call back or seek an in-person evaluation if the symptoms worsen or if the condition fails to improve as anticipated.  I provided 15 minutes of non-face-to-face time during this  encounter.    Zigmund Gottron, NP  09/04/2019 11:03 AM         Zigmund Gottron, NP 09/04/19 [id number]

## 2019-09-04 NOTE — Discharge Instructions (Addendum)
You may take 800mg  of ibuprofen, 1000mg  of tylenol, 4mg  of zofran and 50mg  of benadryl.  Rest in quiet dark room. Limit screen time.  Drink plenty of water to ensure adequate hydration.   I have also sent Fioricet to the pharmacy which you may try as needed for headache, just note that it also carries tylenol so do not take additional tylenol if using.  If no improvement or if worsening please seek further evaluation and treatment in one of our urgent cares.

## 2019-09-05 ENCOUNTER — Telehealth: Payer: 59

## 2019-09-05 ENCOUNTER — Telehealth (HOSPITAL_COMMUNITY): Payer: Self-pay

## 2019-09-05 ENCOUNTER — Telehealth (HOSPITAL_COMMUNITY): Payer: Self-pay | Admitting: Emergency Medicine

## 2019-09-05 MED ORDER — BUTALBITAL-APAP-CAFFEINE 50-325-40 MG PO TABS: 1.0000 | ORAL_TABLET | Freq: Four times a day (QID) | ORAL | 0 refills | Status: AC | PRN

## 2019-09-05 NOTE — Telephone Encounter (Signed)
fioricet discontinued at [facility] and resent to [facility] in [place], per patient request.     Zigmund Gottron, NP 09/05/2019 11:08 AM

## 2019-09-05 NOTE — Telephone Encounter (Signed)
Pt called in needing prescription changed to [facility] in [place].  Staff reached out to the pharmacy to see what we can do to fix the problem and pt was able to use a coupon  Instead of insurance to pick up medication at [facility].

## 2019-09-06 ENCOUNTER — Encounter: Payer: Self-pay | Admitting: Adult Health

## 2019-09-06 ENCOUNTER — Ambulatory Visit (INDEPENDENT_AMBULATORY_CARE_PROVIDER_SITE_OTHER): Payer: 59 | Admitting: Adult Health

## 2019-09-06 ENCOUNTER — Emergency Department (HOSPITAL_COMMUNITY)
Admission: EM | Admit: 2019-09-06 | Discharge: 2019-09-06 | Disposition: A | Discharge status: Home / Self Care | Payer: 59 | Attending: Emergency Medicine | Admitting: Emergency Medicine

## 2019-09-06 ENCOUNTER — Other Ambulatory Visit: Payer: Self-pay

## 2019-09-06 ENCOUNTER — Encounter (HOSPITAL_COMMUNITY): Payer: Self-pay | Admitting: *Deleted

## 2019-09-06 DIAGNOSIS — G43911 Migraine, unspecified, intractable, with status migrainosus: Secondary | ICD-10-CM | POA: Diagnosis not present

## 2019-09-06 DIAGNOSIS — Z5321 Procedure and treatment not carried out due to patient leaving prior to being seen by health care provider: Secondary | ICD-10-CM | POA: Diagnosis not present

## 2019-09-06 DIAGNOSIS — R519 Headache, unspecified: Secondary | ICD-10-CM | POA: Insufficient documentation

## 2019-09-06 NOTE — ED Triage Notes (Signed)
Pt has had a migraine for a week, she has been seen and has taken multiple types of meds without relief. Virtual appt with PCP today who told her to come to the ED to make sure she does not have a bleed, Fell 2 weeks ago and hit head

## 2019-09-06 NOTE — Assessment & Plan Note (Signed)
Assessment and Plan: Due to duration/severity of HA with the amount of OTC/RX medications- recommend that she proceed to nearest ED for immediate imaging- she verbalized understanding/agreement. Her husband can take her to nearest ED.   Follow Up Instructions: If BP remains elevated f/u with primary care   I discussed the assessment and treatment plan with the patient. The patient was provided an opportunity to ask questions and all were answered. The patient agreed with the plan and demonstrated an understanding of the instructions.   The patient was advised to call back or seek an in-person evaluation if the symptoms worsen or if the condition fails to improve as anticipated.

## 2019-09-06 NOTE — Progress Notes (Signed)
Virtual Visit via Telephone Note  I connected with Tracy [name] on 09/06/19 at  4:15 PM EST by telephone and verified that I am speaking with the correct person using two identifiers.  Location: Patient:Home Provider: In Clinic   I discussed the limitations, risks, security and privacy concerns of performing an evaluation and management service by telephone and the availability of in person appointments. I also discussed with the patient that there may be a patient responsible charge related to this service. The patient expressed understanding and agreed to proceed.   History of Present Illness: Tracy [name] calls in with continued with HA sx's for 7 days. Constant HA pain is "on top of my head, a throb, pressure behind my eyes", rated 8/10. She denies nasal drainage/cough/fever/body aches. She reports slight dizziness 48 hrs ago, none at present. She denies change in vision. She denies N/V- currently. She reports "feeling just off" and states "this could be the worse HA in my life". Hx of chronic migraine for decades.  In the last 24 hrs she has taken Maxalt- re-dosed 2 hrs later Fioricet 50/325/40mg  x 2 doses 800mg  Ibuprofen x3 doses 1000mg  Acetaminophen x 3 doses 50mg  Benadryl x 2 doses 4mg  Zofran x 2 doses  09/03/2019- SARS-CoV-2- not detected   Ambulatory BP readings- SBP 140-160 DBP 60-80 She denies acute cardiac sx's Currently on Lisinopril/HCTZ 10/12.5mg  QD  She had TeleMedicine with UC 09/04/2019 Assessment and Plan: Discussed migraine headache treatment plans. Patient would like to try to treat at home. She has zofran at home already, she would prefer to not yet go to the pharmacy- will try combo of ibuprofen, tylenol, zofran, benadryl and rest. fioricet provided if needed. Encouraged to be seen in person if no improvement. Patient verbalized understanding and agreeable to plan.    Patient Care Team    Relationship Specialty Notifications Start End   Mina Marble D, NP PCP - General Family Medicine  04/22/19   Bennetta Laos, MD (Inactive)  Obstetrics and Gynecology  07/04/11   Chucky May, MD Attending Physician Psychiatry  07/04/11   Noemi Chapel, NP Nurse Practitioner   11/29/15   Lyndal Pulley, DO Consulting Physician Family Medicine  04/22/19     Patient Active Problem List   Diagnosis Date Noted  . Oral mucosal lesion 06/17/2019  . Facial rash 06/16/2019  . Dog bite of buttock, initial encounter 06/16/2019  . Healthcare maintenance 04/22/2019  . UTI (urinary tract infection) 04/22/2019  . 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 Unilateral primary osteoarthritis, left hip 02/10/2019  . Greater trochanteric bursitis of left hip 07/24/2018  . Arthritis of left hip 06/25/2018  . Pain in left knee 01/21/2018  . Encounter for general adult medical examination with abnormal findings 01/04/2018  . Vitamin D deficiency 01/04/2018  . Lower back pain 07/18/2017  . Hemorrhoids, internal 07/18/2017  . Trigeminal neuralgia 01/18/2016  . Bipolar disorder ([place]) 11/28/2015  . Borderline personality disorder ([place]) 11/28/2015  . Carpal tunnel syndrome 09/06/2015  . MDD (major depressive disorder) 06/06/2015  . Severe episode of recurrent major depressive disorder, without psychotic features ([place])   . History of posttraumatic stress disorder (PTSD)   . GAD (generalized anxiety disorder)   . Insulin resistance 02/09/2015  . 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 Acanthosis nigricans, acquired 02/09/2015  . Nonalcoholic fatty liver disease 02/09/2015  . Hepatic cirrhosis ([place]) 02/09/2015  . Female hirsutism 02/09/2015  . Oligomenorrhea 02/09/2015  . Infertility associated with anovulation 02/09/2015  .  Morbid obesity ([place]) 10/31/2014  . Anxiety and depression 10/31/2014  . Obstructive sleep apnea 06/27/2014  . Diabetes mellitus, type 2 ([place]) 06/10/2013  . 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 Cystic teratoma    . PCOS (polycystic ovarian syndrome)   . Infertility, female   . TOBACCO USE, QUIT 02/08/2010  . HYPOTHYROIDISM, POSTSURGICAL 10/02/2007  . POLYCYSTIC OVARIAN DISEASE 10/02/2007  . Elevated cholesterol 10/02/2007  . DEPRESSION 10/02/2007  . Migraine 10/02/2007  . Essential hypertension 10/02/2007  . RENAL CALCULUS, HX OF 10/02/2007     Past Medical History:  Diagnosis Date  . Anxiety   . Arthritis    back, knees, right elbow  . Bipolar disorder ([place])   . 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 Cirrhosis ([place])   . Dental crowns present   . Depression   . Diabetes mellitus without complication ([place])   . GERD (gastroesophageal reflux disease)   . High cholesterol    no current med.  Marland Kitchen History of kidney stones   . History of migraine headaches   . Hypertension    under control with meds., has been on med. x 4 yr.  . Hypothyroidism   . NASH (nonalcoholic steatohepatitis)   . PCOS (polycystic ovarian syndrome)   . PONV (postoperative nausea and vomiting)    also hx. of emergence delirium 2007  . Sleep apnea    uses CPAP nightly  . Trigeminal neuralgia      Past Surgical History:  Procedure Laterality Date  . ACHILLES TENDON SURGERY Left 2007  . APPENDECTOMY    . CARPAL TUNNEL RELEASE Right 01/12/2016   Procedure: RIGHT CARPAL TUNNEL RELEASE;  Surgeon: Roseanne Kaufman, MD;  Location: [place];  Service: Orthopedics;  Laterality: Right;  . COLONOSCOPY WITH PROPOFOL N/A 04/20/2014   Procedure: COLONOSCOPY WITH PROPOFOL;  Surgeon: Arta Silence, MD;  Location: [facility];  Service: Endoscopy;  Laterality: N/A;  . ESOPHAGOGASTRODUODENOSCOPY (EGD) WITH PROPOFOL N/A 04/20/2014   Procedure: ESOPHAGOGASTRODUODENOSCOPY (EGD) WITH PROPOFOL;  Surgeon: Arta Silence, MD;  Location: [facility];  Service: Endoscopy;  Laterality: N/A;  . EXCISION HAGLUND'S DEFORMITY WITH ACHILLES TENDON REPAIR Right 02/25/2013   Procedure: RIGHT ACHILLES DEBRIDEMENT AND RECONSTRUCTION;  HAGLUND'S  EXCISION; GASTROC RECESSION AND FLEXOR HALLUCIS LONGUS TRANSFER;  Surgeon: Wylene Simmer, MD;  Location: [place];  Service: Orthopedics;  Laterality: Right;  . JOINT REPLACEMENT     hip  . LUMBAR LAMINECTOMY     X 3  . neck fusion    . OVARIAN CYST SURGERY Left 1998  . PILONIDAL CYST EXCISION  1994  . RIGHT OOPHORECTOMY    . STERIOD INJECTION Left 01/12/2016   Procedure: STEROID INJECTION LEFT WRIST;  Surgeon: Roseanne Kaufman, MD;  Location: [place];  Service: Orthopedics;  Laterality: Left;  . TOTAL HIP ARTHROPLASTY Left 03/19/2019   Procedure: LEFT TOTAL HIP ARTHROPLASTY ANTERIOR APPROACH;  Surgeon: Mcarthur Rossetti, MD;  Location: [facility];  Service: Orthopedics;  Laterality: Left;  . TOTAL THYROIDECTOMY  2003  . UPPER GI ENDOSCOPY  01/19/2015     Family History  Problem Relation Age of Onset  . Heart disease Father   . Depression Father   . Heart attack Father   . Cancer Brother        THROAT colon  . Depression Brother   . Anxiety disorder Brother   . Depression Sister   . Breast cancer Mother   . Hypertension Mother   . Depression Maternal Grandmother   .  Stroke Maternal Grandfather      Social History   Substance and Sexual Activity  Drug Use No     Social History   Substance and Sexual Activity  Alcohol Use No  . Alcohol/week: 0.0 standard drinks   Comment: Has not had any alcohol since 07/2014 - before this date, she rarely drink.     Social History   Tobacco Use  Smoking Status Former Smoker  . Packs/day: 0.50  . Years: 15.00  . Pack years: 7.50  . Types: Cigarettes  . Quit date: 02/2019  . Years since quitting: 0.5  Smokeless Tobacco Never Used     Outpatient Encounter Medications as of 09/06/2019  Medication Sig  . butalbital-acetaminophen-caffeine (FIORICET) 50-325-40 MG tablet Take 1 tablet by mouth every 6 (six) hours as needed for headache.  . cetirizine (ZYRTEC) 10 MG tablet Take 10 mg by mouth daily.   . clonazePAM (KLONOPIN) 1 MG tablet Take 1 mg by mouth 3 (three) times daily.   Marland Kitchen FLUoxetine (PROZAC) 40 MG capsule Take 40 mg by mouth every morning.   . fluticasone (FLONASE) 50 MCG/ACT nasal spray Place 1 spray into both nostrils daily as needed for allergies or rhinitis.  Marland Kitchen lamoTRIgine (LAMICTAL) 200 MG tablet Take 400 mg by mouth every evening.   Marland Kitchen lisinopril-hydrochlorothiazide (ZESTORETIC) 10-12.5 MG tablet TAKE 1 TABLET BY MOUTH DAILY. **NEED VISIT** **NO FURTHER REFILLS UNTIL SEEN** (Patient taking differently: Take 1 tablet by mouth daily. )  . RABEprazole (ACIPHEX) 20 MG tablet Take one tablet, daily. (Patient taking differently: Take 20 mg by mouth 2 (two) times daily. )  . rizatriptan (MAXALT) 10 MG tablet Take 1 tablet (10 mg total) by mouth as needed for migraine. May repeat in 2 hours if needed  . SYNTHROID 200 MCG tablet Take 300 mcg 3 days a week and 200 mcg 4 days a week. (Patient taking differently: Take 200-300 mcg by mouth See admin instructions. Take 1 1/2 tablet (358mcg) on Sunday and Monday. Take 1 tablet (288mcg) all other days)   No facility-administered encounter medications on file as of 09/06/2019.    Allergies: Sulfa antibiotics, Bupropion hcl, Codeine, and Sulfamethoxazole-trimethoprim  There is no height or weight on file to calculate BMI.  Blood pressure 138/89, pulse 60, temperature 97.7 F (36.5 C), temperature source Oral. Review of Systems: General:   Denies fever, chills, unexplained weight loss.  Optho/Auditory:   Denies visual changes, blurred vision/LOV Respiratory:   Denies SOB, DOE more than baseline levels.  Cardiovascular:   Denies chest pain, palpitations, new onset peripheral edema  Gastrointestinal:   Denies nausea, vomiting, diarrhea.  Genitourinary: Denies dysuria, freq/ urgency, flank pain or discharge from genitals.  Endocrine:     Denies hot or cold intolerance, polyuria, polydipsia. Musculoskeletal:   Denies unexplained myalgias,  joint swelling, unexplained arthralgias, gait problems.  Skin:  Denies rash, suspicious lesions Neurological: Denies dizziness, unexplained weakness, numbness  HA + Psychiatric/Behavioral:   Denies mood changes, suicidal or homicidal ideations, hallucinations  Observations/Objective: No acute distress noted during telephone conversation.  Assessment and Plan: Due to duration/severity of HA with the amount of OTC/RX medications- recommend that she proceed to nearest ED for immediate imaging- she verbalized understanding/agreement. Her husband can take her to nearest ED.   Follow Up Instructions: If BP remains elevated f/u with primary care   I discussed the assessment and treatment plan with the patient. The patient was provided an opportunity to ask questions and all were answered. The patient agreed with  the plan and demonstrated an understanding of the instructions.   The patient was advised to call back or seek an in-person evaluation if the symptoms worsen or if the condition fails to improve as anticipated.  I provided 22 minutes of non-face-to-face time during this encounter.   Esaw Grandchild, NP

## 2019-09-07 ENCOUNTER — Encounter: Payer: Self-pay | Admitting: Adult Health

## 2019-09-08 ENCOUNTER — Encounter (HOSPITAL_BASED_OUTPATIENT_CLINIC_OR_DEPARTMENT_OTHER): Payer: Self-pay | Admitting: Emergency Medicine

## 2019-09-08 ENCOUNTER — Emergency Department (HOSPITAL_BASED_OUTPATIENT_CLINIC_OR_DEPARTMENT_OTHER): Payer: 59

## 2019-09-08 ENCOUNTER — Emergency Department (HOSPITAL_BASED_OUTPATIENT_CLINIC_OR_DEPARTMENT_OTHER)
Admission: EM | Admit: 2019-09-08 | Discharge: 2019-09-08 | Disposition: A | Discharge status: Home / Self Care | Payer: 59 | Attending: Emergency Medicine | Admitting: Emergency Medicine

## 2019-09-08 ENCOUNTER — Telehealth: Payer: Self-pay

## 2019-09-08 ENCOUNTER — Other Ambulatory Visit: Payer: Self-pay

## 2019-09-08 DIAGNOSIS — I10 Essential (primary) hypertension: Secondary | ICD-10-CM | POA: Insufficient documentation

## 2019-09-08 DIAGNOSIS — E782 Mixed hyperlipidemia: Secondary | ICD-10-CM | POA: Insufficient documentation

## 2019-09-08 DIAGNOSIS — Z87891 Personal history of nicotine dependence: Secondary | ICD-10-CM | POA: Insufficient documentation

## 2019-09-08 DIAGNOSIS — R519 Headache, unspecified: Secondary | ICD-10-CM | POA: Insufficient documentation

## 2019-09-08 DIAGNOSIS — Z7984 Long term (current) use of oral hypoglycemic drugs: Secondary | ICD-10-CM | POA: Insufficient documentation

## 2019-09-08 DIAGNOSIS — M542 Cervicalgia: Secondary | ICD-10-CM | POA: Insufficient documentation

## 2019-09-08 DIAGNOSIS — Z885 Allergy status to narcotic agent status: Secondary | ICD-10-CM | POA: Insufficient documentation

## 2019-09-08 DIAGNOSIS — Z882 Allergy status to sulfonamides status: Secondary | ICD-10-CM | POA: Insufficient documentation

## 2019-09-08 DIAGNOSIS — Z79899 Other long term (current) drug therapy: Secondary | ICD-10-CM | POA: Diagnosis not present

## 2019-09-08 DIAGNOSIS — Z888 Allergy status to other drugs, medicaments and biological substances status: Secondary | ICD-10-CM | POA: Insufficient documentation

## 2019-09-08 DIAGNOSIS — R0789 Other chest pain: Secondary | ICD-10-CM | POA: Diagnosis not present

## 2019-09-08 DIAGNOSIS — E119 Type 2 diabetes mellitus without complications: Secondary | ICD-10-CM | POA: Diagnosis not present

## 2019-09-08 DIAGNOSIS — R079 Chest pain, unspecified: Secondary | ICD-10-CM | POA: Diagnosis not present

## 2019-09-08 LAB — COMPREHENSIVE METABOLIC PANEL
ALT: 21 U/L (ref 0–44)
AST: 24 U/L (ref 15–41)
Albumin: 3.4 g/dL — ABNORMAL LOW (ref 3.5–5.0)
Alkaline Phosphatase: 71 U/L (ref 38–126)
Anion gap: 7 (ref 5–15)
BUN: 18 mg/dL (ref 6–20)
CO2: 24 mmol/L (ref 22–32)
Calcium: 9.2 mg/dL (ref 8.9–10.3)
Chloride: 105 mmol/L (ref 98–111)
Creatinine, Ser: 0.73 mg/dL (ref 0.44–1.00)
GFR calc Af Amer: >60 mL/min (ref >60)
GFR calc non Af Amer: >60 mL/min (ref >60)
Glucose, Bld: 131 mg/dL — ABNORMAL HIGH (ref 70–99)
Potassium: 4.2 mmol/L (ref 3.5–5.1)
Sodium: 136 mmol/L (ref 135–145)
Total Bilirubin: 0.6 mg/dL (ref 0.3–1.2)
Total Protein: 6.7 g/dL (ref 6.5–8.1)

## 2019-09-08 LAB — CBC WITH DIFFERENTIAL/PLATELET
Abs Immature Granulocytes: 0.01 10*3/uL (ref 0.00–0.07)
Basophils Absolute: 0 10*3/uL (ref 0.0–0.1)
Basophils Relative: 1 %
Eosinophils Absolute: 0.1 10*3/uL (ref 0.0–0.5)
Eosinophils Relative: 3 %
HCT: 37.8 % (ref 36.0–46.0)
Hemoglobin: 12.1 g/dL (ref 12.0–15.0)
Immature Granulocytes: 0 %
Lymphocytes Relative: 38 %
Lymphs Abs: 1.5 10*3/uL (ref 0.7–4.0)
MCH: 25.7 pg — ABNORMAL LOW (ref 26.0–34.0)
MCHC: 32 g/dL (ref 30.0–36.0)
MCV: 80.3 fL (ref 80.0–100.0)
Monocytes Absolute: 0.3 10*3/uL (ref 0.1–1.0)
Monocytes Relative: 7 %
Neutro Abs: 2.1 10*3/uL (ref 1.7–7.7)
Neutrophils Relative %: 51 %
Platelets: 231 10*3/uL (ref 150–400)
RBC: 4.71 MIL/uL (ref 3.87–5.11)
RDW: 17.5 % — ABNORMAL HIGH (ref 11.5–15.5)
WBC: 4 10*3/uL (ref 4.0–10.5)
nRBC: 0 % (ref 0.0–0.2)

## 2019-09-08 LAB — TROPONIN I (HIGH SENSITIVITY): Troponin I (High Sensitivity): <2 ng/L (ref <18)

## 2019-09-08 MED ORDER — DIPHENHYDRAMINE HCL 50 MG/ML IJ SOLN
12.5000 mg | Freq: Once | INTRAMUSCULAR | Status: AC
  Administered 2019-09-08: 10:00:00 12.5 mg via INTRAVENOUS
  Filled 2019-09-08: qty 1

## 2019-09-08 MED ORDER — METOCLOPRAMIDE HCL 5 MG/ML IJ SOLN
10.0000 mg | Freq: Once | INTRAMUSCULAR | Status: AC
  Administered 2019-09-08: 10 mg via INTRAVENOUS
  Filled 2019-09-08: qty 2

## 2019-09-08 NOTE — Discharge Instructions (Signed)
It was my pleasure taking care of you today!   Call your primary care doctor today or tomorrow to schedule a follow up appointment for recheck.   Return to ER for new or worsening symptoms, any additional concerns.

## 2019-09-08 NOTE — ED Triage Notes (Signed)
Pt sts she was referred here by PCP d/t home reading of BP 173/103; pt reports HA x 10days, neck pain

## 2019-09-08 NOTE — ED Notes (Signed)
Patient transported to CT 

## 2019-09-08 NOTE — Telephone Encounter (Signed)
Pt calls stating that her blood pressure this morning is 173/103 and she has a terrible headache.  Pt denies any dizziness or red flag symptoms.  Advised pt, per Mina Marble NP, that pt needs to be seen at ED for possible medications to lower BP more urgently and be monitored.  Advised pt that since she previously was unable to wait at [facility] ED d/t the wait time, to proceed to [place] for evaluation and treatment.  Advised pt that if she experiences any dizziness, since she has no one to drive her, she is to pull over and call 911.  Pt expressed understanding and is agreeable.  Charyl Bigger, CMA

## 2019-09-08 NOTE — ED Provider Notes (Signed)
[facility] EMERGENCY DEPARTMENT Provider Note   CSN: [id number] Arrival date & time: 09/08/19  [id number]     History Chief Complaint  Patient presents with  . Headache    Tracy [name] is a 54 y.o. female.  The history is provided by the patient and medical records. No language interpreter was used.     Tracy [name] is a 54 y.o. female  with a PMH as listed below who presents to the Emergency Department complaining of headache x 10 days.  Pain has been waxing and waning in intensity, but today, was much more severe.  Feels similar to her previous migraines, however much more severe than she has experienced in the past.  She has taken multiple home medications including her Fioricet and Maxalt with no improvement.  She called her doctor 2 days ago who encouraged her to go to the emergency department for such.  She waited for several hours and decided to go home instead.  Today, her blood pressure was 173/103 and headache had worsened, therefore she called her primary care doctor who recommended she come to the emergency department for eval today.  She endorses having central chest discomfort throughout this timeframe as well.  It is not exertional and will occasionally occur while at rest.  She does endorse both anterior and posterior neck pain.  She attributes this to sleeping on a new pillow which she got from [place] recently and feels as if it is musculoskeletal.  She endorses a chronic daily morning cough with no acute worsening in her symptoms.  Not productive.  She does report shortness of breath over this timeframe.  She was seen at [facility] on Saturday where she had a Covid test which was reportedly negative. No abdominal pain, n/v/d. No fevers.   Past Medical History:  Diagnosis Date  . Anxiety   . Arthritis    back, knees, right elbow  . Bipolar disorder ([place])   . 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 Cirrhosis ([place])   . Dental crowns present   .  Depression   . Diabetes mellitus without complication ([place])   . GERD (gastroesophageal reflux disease)   . High cholesterol    no current med.  Marland Kitchen History of kidney stones   . History of migraine headaches   . Hypertension    under control with meds., has been on med. x 4 yr.  . Hypothyroidism   . NASH (nonalcoholic steatohepatitis)   . PCOS (polycystic ovarian syndrome)   . PONV (postoperative nausea and vomiting)    also hx. of emergence delirium 2007  . Sleep apnea    uses CPAP nightly  . Trigeminal neuralgia     Patient Active Problem List   Diagnosis Date Noted  . Oral mucosal lesion 06/17/2019  . Facial rash 06/16/2019  . Dog bite of buttock, initial encounter 06/16/2019  . Healthcare maintenance 04/22/2019  . UTI (urinary tract infection) 04/22/2019  . 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 Unilateral primary osteoarthritis, left hip 02/10/2019  . Greater trochanteric bursitis of left hip 07/24/2018  . Arthritis of left hip 06/25/2018  . Pain in left knee 01/21/2018  . Encounter for general adult medical examination with abnormal findings 01/04/2018  . Vitamin D deficiency 01/04/2018  . Lower back pain 07/18/2017  . Hemorrhoids, internal 07/18/2017  . Trigeminal neuralgia 01/18/2016  . Bipolar disorder ([place]) 11/28/2015  . Borderline personality disorder ([place]) 11/28/2015  . Carpal tunnel  syndrome 09/06/2015  . MDD (major depressive disorder) 06/06/2015  . Severe episode of recurrent major depressive disorder, without psychotic features ([place])   . History of posttraumatic stress disorder (PTSD)   . GAD (generalized anxiety disorder)   . Insulin resistance 02/09/2015  . 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 Acanthosis nigricans, acquired 02/09/2015  . Nonalcoholic fatty liver disease 02/09/2015  . Hepatic cirrhosis ([place]) 02/09/2015  . Female hirsutism 02/09/2015  . Oligomenorrhea 02/09/2015  . Infertility associated with anovulation 02/09/2015  . Morbid  obesity ([place]) 10/31/2014  . Anxiety and depression 10/31/2014  . Obstructive sleep apnea 06/27/2014  . Diabetes mellitus, type 2 ([place]) 06/10/2013  . 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[place]) 01/01/2012  . Cystic teratoma   . PCOS (polycystic ovarian syndrome)   . Infertility, female   . TOBACCO USE, QUIT 02/08/2010  . HYPOTHYROIDISM, POSTSURGICAL 10/02/2007  . POLYCYSTIC OVARIAN DISEASE 10/02/2007  . Elevated cholesterol 10/02/2007  . DEPRESSION 10/02/2007  . Migraine 10/02/2007  . Essential hypertension 10/02/2007  . RENAL CALCULUS, HX OF 10/02/2007    Past Surgical History:  Procedure Laterality Date  . ACHILLES TENDON SURGERY Left 2007  . APPENDECTOMY    . CARPAL TUNNEL RELEASE Right 01/12/2016   Procedure: RIGHT CARPAL TUNNEL RELEASE;  Surgeon: Roseanne Kaufman, MD;  Location: [facility];  Service: Orthopedics;  Laterality: Right;  . COLONOSCOPY WITH PROPOFOL N/A 04/20/2014   Procedure: COLONOSCOPY WITH PROPOFOL;  Surgeon: Arta Silence, MD;  Location: [facility];  Service: Endoscopy;  Laterality: N/A;  . ESOPHAGOGASTRODUODENOSCOPY (EGD) WITH PROPOFOL N/A 04/20/2014   Procedure: ESOPHAGOGASTRODUODENOSCOPY (EGD) WITH PROPOFOL;  Surgeon: Arta Silence, MD;  Location: [facility];  Service: Endoscopy;  Laterality: N/A;  . EXCISION HAGLUND'S DEFORMITY WITH ACHILLES TENDON REPAIR Right 02/25/2013   Procedure: RIGHT ACHILLES DEBRIDEMENT AND RECONSTRUCTION;  HAGLUND'S EXCISION; GASTROC RECESSION AND FLEXOR HALLUCIS LONGUS TRANSFER;  Surgeon: Wylene Simmer, MD;  Location: [facility];  Service: Orthopedics;  Laterality: Right;  . JOINT REPLACEMENT     hip  . LUMBAR LAMINECTOMY     X 3  . neck fusion    . OVARIAN CYST SURGERY Left 1998  . PILONIDAL CYST EXCISION  1994  . RIGHT OOPHORECTOMY    . STERIOD INJECTION Left 01/12/2016   Procedure: STEROID INJECTION LEFT WRIST;  Surgeon: Roseanne Kaufman, MD;  Location: [facility];  Service: Orthopedics;  Laterality:  Left;  . TOTAL HIP ARTHROPLASTY Left 03/19/2019   Procedure: LEFT TOTAL HIP ARTHROPLASTY ANTERIOR APPROACH;  Surgeon: Mcarthur Rossetti, MD;  Location: [facility];  Service: Orthopedics;  Laterality: Left;  . TOTAL THYROIDECTOMY  2003  . UPPER GI ENDOSCOPY  01/19/2015     OB History    Gravida  0   Para      Term      Preterm      AB      Living        SAB      TAB      Ectopic      Multiple      Live Births              Family History  Problem Relation Age of Onset  . Heart disease Father   . Depression Father   . Heart attack Father   . Cancer Brother        THROAT colon  . Depression Brother   . Anxiety disorder Brother   . Depression Sister   . Breast cancer Mother   .  Hypertension Mother   . Depression Maternal Grandmother   . Stroke Maternal Grandfather     Social History   Tobacco Use  . Smoking status: Former Smoker    Packs/day: 0.50    Years: 15.00    Pack years: 7.50    Types: Cigarettes    Quit date: 02/2019    Years since quitting: 0.5  . Smokeless tobacco: Never Used  Substance Use Topics  . Alcohol use: No    Alcohol/week: 0.0 standard drinks    Comment: Has not had any alcohol since 07/2014 - before this date, she rarely drink.  . Drug use: No    Home Medications Prior to Admission medications   Medication Sig Start Date End Date Taking? Authorizing Provider  butalbital-acetaminophen-caffeine (FIORICET) 50-325-40 MG tablet Take 1 tablet by mouth every 6 (six) hours as needed for headache. 09/05/19 09/04/20  Zigmund Gottron, NP  cetirizine (ZYRTEC) 10 MG tablet Take 10 mg by mouth daily.    [provider]  clonazePAM (KLONOPIN) 1 MG tablet Take 1 mg by mouth 3 (three) times daily.  02/03/17   [provider]  FLUoxetine (PROZAC) 40 MG capsule Take 40 mg by mouth every morning.  06/15/18   [provider]  fluticasone (FLONASE) 50 MCG/ACT nasal spray Place 1 spray into both nostrils daily as needed for  allergies or rhinitis.    [provider]  lamoTRIgine (LAMICTAL) 200 MG tablet Take 400 mg by mouth every evening.  02/26/18   [provider]  lisinopril-hydrochlorothiazide (ZESTORETIC) 10-12.5 MG tablet TAKE 1 TABLET BY MOUTH DAILY. **NEED VISIT** **NO FURTHER REFILLS UNTIL SEEN** Patient taking differently: Take 1 tablet by mouth daily.  03/07/19   Plotnikov, Evie Lacks, MD  RABEprazole (ACIPHEX) 20 MG tablet Take one tablet, daily. Patient taking differently: Take 20 mg by mouth 2 (two) times daily.  10/28/18 10/28/19  [name], MD  rizatriptan (MAXALT) 10 MG tablet Take 1 tablet (10 mg total) by mouth as needed for migraine. May repeat in 2 hours if needed 12/23/17   Binnie Rail, MD  SYNTHROID 200 MCG tablet Take 300 mcg 3 days a week and 200 mcg 4 days a week. Patient taking differently: Take 200-300 mcg by mouth See admin instructions. Take 1 1/2 tablet (326mcg) on Sunday and Monday. Take 1 tablet (285mcg) all other days 04/29/19   [name], MD    Allergies    Sulfa antibiotics, Bupropion hcl, Codeine, and Sulfamethoxazole-trimethoprim  Review of Systems   Review of Systems  Respiratory: Positive for cough (Chronic, daily, in the morning with no acute changes.) and shortness of breath.   Cardiovascular: Positive for chest pain. Negative for palpitations and leg swelling.  Neurological: Positive for headaches. Negative for dizziness, syncope, weakness, light-headedness and numbness.  All other systems reviewed and are negative.   Physical Exam Updated Vital Signs BP 126/84   Pulse 69   Temp 98 F (36.7 C) (Oral)   Resp 20   Ht 5\' 11"  (1.803 m)   Wt 127 kg   LMP 08/30/2018 (Approximate)   SpO2 99%   BMI 39.05 kg/m   Physical Exam Vitals and nursing note reviewed.  Constitutional:      General: She is not in acute distress.    Appearance: She is well-developed.  HENT:     Head: Normocephalic and atraumatic.  Neck:     Comments: No  meningeal signs.  Full range of motion.  No midline cervical bony  tenderness. Cardiovascular:     Rate and Rhythm: Normal rate and regular rhythm.     Heart sounds: Normal heart sounds. No murmur.  Pulmonary:     Effort: Pulmonary effort is normal. No respiratory distress.     Breath sounds: Normal breath sounds. No wheezing.  Abdominal:     General: There is no distension.     Palpations: Abdomen is soft.     Tenderness: There is no abdominal tenderness.  Musculoskeletal:     Cervical back: Neck supple.  Skin:    General: Skin is warm and dry.  Neurological:     Mental Status: She is alert and oriented to person, place, and time.     Comments: Speech clear and goal oriented. CN 2-12 grossly intact. Strength and sensation intact.     ED Results / Procedures / Treatments   Labs (all labs ordered are listed, but only abnormal results are displayed) Labs Reviewed  CBC WITH DIFFERENTIAL/PLATELET - Abnormal; Notable for the following components:      Result Value   MCH 25.7 (*)    RDW 17.5 (*)    All other components within normal limits  COMPREHENSIVE METABOLIC PANEL - Abnormal; Notable for the following components:   Glucose, Bld 131 (*)    Albumin 3.4 (*)    All other components within normal limits  TROPONIN I (HIGH SENSITIVITY)    EKG EKG Interpretation  Date/Time:  Wednesday September 08 2019 09:58:09 EST Ventricular Rate:  66 PR Interval:    QRS Duration: 92 QT Interval:  434 QTC Calculation: 455 R Axis:   33 Text Interpretation: Sinus rhythm Low voltage, precordial leads No STEMI Confirmed by Nanda Quinton [phone]) on 09/08/2019 10:01:54 AM   Radiology DG Chest 2 View  Result Date: 09/08/2019 CLINICAL DATA:  Chest pain EXAM: CHEST - 2 VIEW COMPARISON:  05/05/2019 FINDINGS: The heart size and mediastinal contours are within normal limits. Both lungs are clear. The visualized skeletal structures are unremarkable. IMPRESSION: No acute abnormality of the lungs.  Electronically Signed   By: Eddie Candle M.D.   On: 09/08/2019 10:49   CT Head Wo Contrast  Result Date: 09/08/2019 CLINICAL DATA:  Headache. EXAM: CT HEAD WITHOUT CONTRAST TECHNIQUE: Contiguous axial images were obtained from the base of the skull through the vertex without intravenous contrast. COMPARISON:  October 11, 2013. FINDINGS: Brain: No evidence of acute infarction, hemorrhage, hydrocephalus, extra-axial collection or mass lesion/mass effect. Vascular: No hyperdense vessel or unexpected calcification. Skull: Normal. Negative for fracture or focal lesion. Sinuses/Orbits: No acute finding. Other: None. IMPRESSION: Normal head CT. Electronically Signed   By: Marijo Conception M.D.   On: 09/08/2019 10:49    Procedures Procedures (including critical care time)  Medications Ordered in ED Medications  metoCLOPramide (REGLAN) injection 10 mg (10 mg Intravenous Given 09/08/19 1027)  diphenhydrAMINE (BENADRYL) injection 12.5 mg (12.5 mg Intravenous Given 09/08/19 1024)    ED Course  I have reviewed the triage vital signs and the nursing notes.  Pertinent labs & imaging results that were available during my care of the patient were reviewed by me and considered in my medical decision making (see chart for details).    MDM Rules/Calculators/A&P                     Tracy [name] is a 54 y.o. female who presents to ED for headache c/w her usual migraines however much more severe in nature and lasting much longer than usual.  PCP was concerned as she reported fall a few days before onset and recommended ED visit. No focal neuro deficits on exam. CT head without acute findings. Feels much improved after Reglan and benadryl provided in ED.   Patient additionally reported chest discomfort over this same time frame. Afebrile, VSS with normal cardiopulmonary exam. EKG reviewed with attending without acute ischemic changes. Labs reviewed and reassuring including negative troponin. PERC negative.  Heart score 2. Patient's symptoms unlikely to be of cardiac etiology.   Evaluation does not show pathology that would require ongoing emergent intervention or inpatient treatment. PCP follow up encouraged. Reasons to return to ER were discussed and all questions answered.   Final Clinical Impression(s) / ED Diagnoses Final diagnoses:  Bad headache    Rx / DC Orders ED Discharge Orders    None       Zarya Lasseigne, Ozella Almond, PA-C 09/08/19 1128    Margette Fast, MD 09/08/19 [phone]

## 2019-09-15 DIAGNOSIS — F3181 Bipolar II disorder: Secondary | ICD-10-CM | POA: Diagnosis not present

## 2019-09-17 DIAGNOSIS — R220 Localized swelling, mass and lump, head: Secondary | ICD-10-CM | POA: Diagnosis not present

## 2019-09-22 DIAGNOSIS — Z20822 Contact with and (suspected) exposure to covid-19: Secondary | ICD-10-CM | POA: Diagnosis not present

## 2019-09-22 DIAGNOSIS — R220 Localized swelling, mass and lump, head: Secondary | ICD-10-CM | POA: Diagnosis not present

## 2019-09-23 DIAGNOSIS — F3181 Bipolar II disorder: Secondary | ICD-10-CM | POA: Diagnosis not present

## 2019-09-24 DIAGNOSIS — G43909 Migraine, unspecified, not intractable, without status migrainosus: Secondary | ICD-10-CM | POA: Diagnosis not present

## 2019-09-24 DIAGNOSIS — I10 Essential (primary) hypertension: Secondary | ICD-10-CM | POA: Diagnosis not present

## 2019-09-24 DIAGNOSIS — E669 Obesity, unspecified: Secondary | ICD-10-CM | POA: Diagnosis not present

## 2019-09-24 DIAGNOSIS — G4730 Sleep apnea, unspecified: Secondary | ICD-10-CM | POA: Diagnosis not present

## 2019-09-24 DIAGNOSIS — Z87891 Personal history of nicotine dependence: Secondary | ICD-10-CM | POA: Diagnosis not present

## 2019-09-24 DIAGNOSIS — K1379 Other lesions of oral mucosa: Secondary | ICD-10-CM | POA: Diagnosis not present

## 2019-09-24 DIAGNOSIS — F329 Major depressive disorder, single episode, unspecified: Secondary | ICD-10-CM | POA: Diagnosis not present

## 2019-09-24 DIAGNOSIS — K219 Gastro-esophageal reflux disease without esophagitis: Secondary | ICD-10-CM | POA: Diagnosis not present

## 2019-09-24 DIAGNOSIS — E039 Hypothyroidism, unspecified: Secondary | ICD-10-CM | POA: Diagnosis not present

## 2019-09-24 DIAGNOSIS — F419 Anxiety disorder, unspecified: Secondary | ICD-10-CM | POA: Diagnosis not present

## 2019-09-29 DIAGNOSIS — F3181 Bipolar II disorder: Secondary | ICD-10-CM | POA: Diagnosis not present

## 2019-10-14 ENCOUNTER — Ambulatory Visit (INDEPENDENT_AMBULATORY_CARE_PROVIDER_SITE_OTHER): Payer: 59 | Admitting: Orthopaedic Surgery

## 2019-10-14 ENCOUNTER — Other Ambulatory Visit: Payer: Self-pay

## 2019-10-14 ENCOUNTER — Ambulatory Visit (INDEPENDENT_AMBULATORY_CARE_PROVIDER_SITE_OTHER): Payer: 59

## 2019-10-14 ENCOUNTER — Encounter: Payer: Self-pay | Admitting: Orthopaedic Surgery

## 2019-10-14 DIAGNOSIS — F3181 Bipolar II disorder: Secondary | ICD-10-CM | POA: Diagnosis not present

## 2019-10-14 DIAGNOSIS — Z96642 Presence of left artificial hip joint: Secondary | ICD-10-CM

## 2019-10-14 NOTE — Progress Notes (Signed)
The patient is a very active and pleasant 54 year old female who is now 7 months out from a left total hip arthroplasty through direct anterior approach.  She states that she is doing wonderful and has no issues at all.  On exam her leg lengths are equal.  I put her left operative hip through full motion and causes no problems.  Her incisions healed nicely.  There is no numbness and tingling.  Her right hip exam is normal.  A standing AP pelvis and lateral of the left operative hip shows a well-seated total hip arthroplasty with no complicating features.  There is no evidence of loosening.  At this point follow-up can be as needed for that left hip.  She understands that if she develops any problems at all she should not hesitate to come see [place].  All questions and concerns were answered and addressed.

## 2019-10-19 ENCOUNTER — Other Ambulatory Visit: Payer: Self-pay | Admitting: Adult Health

## 2019-10-19 MED ORDER — LISINOPRIL-HYDROCHLOROTHIAZIDE 10-12.5 MG PO TABS: ORAL_TABLET | ORAL | 1 refills | Status: DC

## 2019-10-19 NOTE — Telephone Encounter (Signed)
Patient called while office closed for lunch-left message requesting refill on BP meds ( ask for emergency supply till date of Appt)--   ---Forwarding request to med asst that if Rx approved for  : --------lisinopril-hydrochlorothiazide (ZESTORETIC) 10-12.5 MG tablet [id number]   Order Details Dose, Route, Frequency: As Directed  Dispense Quantity: 90 tablet Refills: 1       Sig: TAKE 1 TABLET BY MOUTH DAILY. **NEED VISIT** **NO FURTHER REFILLS UNTIL SEEN**  Patient taking differently: Take 1 tablet by mouth daily.        pls send refill order to:   [id number] [name], [place] - [place] [phone] (Phone) [phone] (Fax)   --glh

## 2019-10-21 DIAGNOSIS — G4733 Obstructive sleep apnea (adult) (pediatric): Secondary | ICD-10-CM | POA: Diagnosis not present

## 2019-10-27 DIAGNOSIS — F3181 Bipolar II disorder: Secondary | ICD-10-CM | POA: Diagnosis not present

## 2019-10-29 DIAGNOSIS — K1370 Unspecified lesions of oral mucosa: Secondary | ICD-10-CM | POA: Diagnosis not present

## 2019-11-10 ENCOUNTER — Ambulatory Visit: Payer: Self-pay

## 2019-11-10 ENCOUNTER — Ambulatory Visit: Payer: 59 | Attending: Internal Medicine

## 2019-11-10 DIAGNOSIS — Z23 Encounter for immunization: Secondary | ICD-10-CM

## 2019-11-10 NOTE — Progress Notes (Signed)
   Covid-19 Vaccination Clinic  Name:  Dkayla Mcninch    MRN: [id number] DOB: [birth year]  11/10/2019  [name] was observed post Covid-19 immunization for 15 minutes without incident. She was provided with Vaccine Information Sheet and instruction to access the V-Safe system.   [name] was instructed to call 911 with any severe reactions post vaccine: Marland Kitchen Difficulty breathing  . Swelling of face and throat  . A fast heartbeat  . A bad rash all over body  . Dizziness and weakness   Immunizations Administered    Name Date Dose VIS Date Route   Pfizer COVID-19 Vaccine 11/10/2019  8:27 AM 0.3 mL 07/16/2019 Intramuscular   Manufacturer: [place]   Lot: [id number]   [place]: [id number]

## 2019-11-13 ENCOUNTER — Other Ambulatory Visit (INDEPENDENT_AMBULATORY_CARE_PROVIDER_SITE_OTHER): Payer: Self-pay | Admitting: "Endocrinology

## 2019-11-13 DIAGNOSIS — R1013 Epigastric pain: Secondary | ICD-10-CM

## 2019-11-13 DIAGNOSIS — R112 Nausea with vomiting, unspecified: Secondary | ICD-10-CM

## 2019-11-17 ENCOUNTER — Ambulatory Visit: Payer: 59 | Admitting: Family Medicine

## 2019-11-25 DIAGNOSIS — F3181 Bipolar II disorder: Secondary | ICD-10-CM | POA: Diagnosis not present

## 2019-12-02 ENCOUNTER — Ambulatory Visit: Payer: 59 | Admitting: Physician Assistant

## 2019-12-09 DIAGNOSIS — F3181 Bipolar II disorder: Secondary | ICD-10-CM | POA: Diagnosis not present

## 2019-12-28 ENCOUNTER — Ambulatory Visit: Payer: 59

## 2019-12-29 DIAGNOSIS — F419 Anxiety disorder, unspecified: Secondary | ICD-10-CM | POA: Diagnosis not present

## 2019-12-29 DIAGNOSIS — F333 Major depressive disorder, recurrent, severe with psychotic symptoms: Secondary | ICD-10-CM | POA: Diagnosis not present

## 2020-01-13 DIAGNOSIS — G4733 Obstructive sleep apnea (adult) (pediatric): Secondary | ICD-10-CM | POA: Diagnosis not present

## 2020-01-18 DIAGNOSIS — F3181 Bipolar II disorder: Secondary | ICD-10-CM | POA: Diagnosis not present

## 2020-01-21 DIAGNOSIS — F332 Major depressive disorder, recurrent severe without psychotic features: Secondary | ICD-10-CM | POA: Diagnosis not present

## 2020-01-24 DIAGNOSIS — F332 Major depressive disorder, recurrent severe without psychotic features: Secondary | ICD-10-CM | POA: Diagnosis not present

## 2020-01-25 DIAGNOSIS — F332 Major depressive disorder, recurrent severe without psychotic features: Secondary | ICD-10-CM | POA: Diagnosis not present

## 2020-01-26 DIAGNOSIS — F332 Major depressive disorder, recurrent severe without psychotic features: Secondary | ICD-10-CM | POA: Diagnosis not present

## 2020-01-27 DIAGNOSIS — F332 Major depressive disorder, recurrent severe without psychotic features: Secondary | ICD-10-CM | POA: Diagnosis not present

## 2020-01-27 DIAGNOSIS — F3181 Bipolar II disorder: Secondary | ICD-10-CM | POA: Diagnosis not present

## 2020-01-28 DIAGNOSIS — F332 Major depressive disorder, recurrent severe without psychotic features: Secondary | ICD-10-CM | POA: Diagnosis not present

## 2020-01-31 DIAGNOSIS — F332 Major depressive disorder, recurrent severe without psychotic features: Secondary | ICD-10-CM | POA: Diagnosis not present

## 2020-02-01 DIAGNOSIS — F332 Major depressive disorder, recurrent severe without psychotic features: Secondary | ICD-10-CM | POA: Diagnosis not present

## 2020-02-02 DIAGNOSIS — F332 Major depressive disorder, recurrent severe without psychotic features: Secondary | ICD-10-CM | POA: Diagnosis not present

## 2020-02-03 DIAGNOSIS — F332 Major depressive disorder, recurrent severe without psychotic features: Secondary | ICD-10-CM | POA: Diagnosis not present

## 2020-02-04 DIAGNOSIS — F332 Major depressive disorder, recurrent severe without psychotic features: Secondary | ICD-10-CM | POA: Diagnosis not present

## 2020-02-07 ENCOUNTER — Other Ambulatory Visit (INDEPENDENT_AMBULATORY_CARE_PROVIDER_SITE_OTHER): Payer: Self-pay | Admitting: "Endocrinology

## 2020-02-08 DIAGNOSIS — F332 Major depressive disorder, recurrent severe without psychotic features: Secondary | ICD-10-CM | POA: Diagnosis not present

## 2020-02-09 ENCOUNTER — Other Ambulatory Visit (INDEPENDENT_AMBULATORY_CARE_PROVIDER_SITE_OTHER): Payer: Self-pay | Admitting: "Endocrinology

## 2020-02-09 DIAGNOSIS — F332 Major depressive disorder, recurrent severe without psychotic features: Secondary | ICD-10-CM | POA: Diagnosis not present

## 2020-02-10 DIAGNOSIS — F332 Major depressive disorder, recurrent severe without psychotic features: Secondary | ICD-10-CM | POA: Diagnosis not present

## 2020-02-10 DIAGNOSIS — F3181 Bipolar II disorder: Secondary | ICD-10-CM | POA: Diagnosis not present

## 2020-02-11 DIAGNOSIS — F332 Major depressive disorder, recurrent severe without psychotic features: Secondary | ICD-10-CM | POA: Diagnosis not present

## 2020-02-14 DIAGNOSIS — F332 Major depressive disorder, recurrent severe without psychotic features: Secondary | ICD-10-CM | POA: Diagnosis not present

## 2020-02-15 DIAGNOSIS — F332 Major depressive disorder, recurrent severe without psychotic features: Secondary | ICD-10-CM | POA: Diagnosis not present

## 2020-02-16 DIAGNOSIS — F332 Major depressive disorder, recurrent severe without psychotic features: Secondary | ICD-10-CM | POA: Diagnosis not present

## 2020-02-17 DIAGNOSIS — F332 Major depressive disorder, recurrent severe without psychotic features: Secondary | ICD-10-CM | POA: Diagnosis not present

## 2020-02-18 DIAGNOSIS — F332 Major depressive disorder, recurrent severe without psychotic features: Secondary | ICD-10-CM | POA: Diagnosis not present

## 2020-02-21 DIAGNOSIS — F332 Major depressive disorder, recurrent severe without psychotic features: Secondary | ICD-10-CM | POA: Diagnosis not present

## 2020-02-22 DIAGNOSIS — F332 Major depressive disorder, recurrent severe without psychotic features: Secondary | ICD-10-CM | POA: Diagnosis not present

## 2020-02-23 DIAGNOSIS — K121 Other forms of stomatitis: Secondary | ICD-10-CM | POA: Diagnosis not present

## 2020-02-23 DIAGNOSIS — F3181 Bipolar II disorder: Secondary | ICD-10-CM | POA: Diagnosis not present

## 2020-02-23 DIAGNOSIS — E119 Type 2 diabetes mellitus without complications: Secondary | ICD-10-CM | POA: Diagnosis not present

## 2020-02-23 DIAGNOSIS — K1370 Unspecified lesions of oral mucosa: Secondary | ICD-10-CM | POA: Diagnosis not present

## 2020-02-28 DIAGNOSIS — F332 Major depressive disorder, recurrent severe without psychotic features: Secondary | ICD-10-CM | POA: Diagnosis not present

## 2020-02-29 DIAGNOSIS — F332 Major depressive disorder, recurrent severe without psychotic features: Secondary | ICD-10-CM | POA: Diagnosis not present

## 2020-03-01 ENCOUNTER — Encounter: Payer: Self-pay | Admitting: Family Medicine

## 2020-03-01 ENCOUNTER — Other Ambulatory Visit: Payer: Self-pay | Admitting: Family Medicine

## 2020-03-01 ENCOUNTER — Ambulatory Visit (INDEPENDENT_AMBULATORY_CARE_PROVIDER_SITE_OTHER): Payer: 59 | Admitting: Family Medicine

## 2020-03-01 VITALS — BP 133/86 | HR 83 | Temp 97.8°F | Ht 70.87 in | Wt 302.0 lb

## 2020-03-01 DIAGNOSIS — B372 Candidiasis of skin and nail: Secondary | ICD-10-CM

## 2020-03-01 DIAGNOSIS — F332 Major depressive disorder, recurrent severe without psychotic features: Secondary | ICD-10-CM | POA: Diagnosis not present

## 2020-03-01 DIAGNOSIS — R112 Nausea with vomiting, unspecified: Secondary | ICD-10-CM

## 2020-03-01 DIAGNOSIS — E1169 Type 2 diabetes mellitus with other specified complication: Secondary | ICD-10-CM

## 2020-03-01 DIAGNOSIS — F3132 Bipolar disorder, current episode depressed, moderate: Secondary | ICD-10-CM

## 2020-03-01 DIAGNOSIS — I10 Essential (primary) hypertension: Secondary | ICD-10-CM

## 2020-03-01 DIAGNOSIS — R1013 Epigastric pain: Secondary | ICD-10-CM

## 2020-03-01 DIAGNOSIS — E890 Postprocedural hypothyroidism: Secondary | ICD-10-CM

## 2020-03-01 MED ORDER — RABEPRAZOLE SODIUM 20 MG PO TBEC: DELAYED_RELEASE_TABLET | ORAL | 1 refills | Status: DC

## 2020-03-01 MED ORDER — NYSTATIN 100000 UNIT/GM EX POWD: 1.0000 "application " | Freq: Three times a day (TID) | CUTANEOUS | 0 refills | Status: DC

## 2020-03-01 MED ORDER — LISINOPRIL-HYDROCHLOROTHIAZIDE 10-12.5 MG PO TABS: ORAL_TABLET | ORAL | 1 refills | Status: DC

## 2020-03-01 MED ORDER — SYNTHROID 200 MCG PO TABS: 200.0000 ug | ORAL_TABLET | ORAL | 1 refills | Status: DC

## 2020-03-01 MED ORDER — NYSTATIN 100000 UNIT/GM EX POWD: 1.0000 "application " | Freq: Three times a day (TID) | CUTANEOUS | 3 refills | Status: DC

## 2020-03-01 NOTE — Assessment & Plan Note (Signed)
She is followed by psychiatry and reports stable symptoms.  Has upcoming [place] session.

## 2020-03-01 NOTE — Assessment & Plan Note (Signed)
Blood pressure is at goal at for age and co-morbidities.  I recommend she continue lisinopril/hctz at current dose.  In addition they were instructed to follow a low sodium diet with regular exercise to help to maintain adequate control of blood pressure.

## 2020-03-01 NOTE — Patient Instructions (Signed)
Nice to meet you today! Please have labs completed.  We'll be in touch with recommendations.  Please continue current medications.

## 2020-03-01 NOTE — Assessment & Plan Note (Signed)
Blood sugars have been well controlled without medications.  Update a1c today.

## 2020-03-01 NOTE — Assessment & Plan Note (Signed)
Update TSH today. 

## 2020-03-01 NOTE — Assessment & Plan Note (Signed)
Rx for nystatin powder sent in.

## 2020-03-01 NOTE — Progress Notes (Signed)
Tracy [name] - 54 y.o. female MRN [id number]  Date of birth: 1965-11-17  Subjective Chief Complaint  Patient presents with  . Establish Care    HPI Tracy [name] is a 54 y.o. female with history of bipolar disorder with depression and anxiety, PCOS, T2DM, post surgical hypothyroidism, HTN and GERD here today for initial visit.    She is followed by outside Dr. Dorethea Clan for management of bipolar d/o and depression.  Some increased stress related to recent separation from her husband.  She is doing Arispe and has second treatment later this year.  Symptoms are stable at this time.   She was seeing Dr. Tobe Sos for her thyroid and diabetes.  She has not had labs in a while and would like for me to take over management of this due to convenience of our location.  She is not taking any medications for diabetes at this time.  Last a1c 4.9% in 03/2019.    Reports stable symptoms related to hypothyroidism.  She is on mixed dosing of levothyroxine with 325mcg on Sunday and Monday and 236mcg on all other days.   She does reports recurrent yeast dermatitis under pannus.  She has tried OTC jock itch cream but nystatin powder has worked better for her.    ROS:  A comprehensive ROS was completed and negative except as noted per HPI  Allergies  Allergen Reactions  . Sulfa Antibiotics Itching, Swelling and Rash  . Bupropion Hcl Itching and Rash  . Codeine Itching  . Sulfamethoxazole-Trimethoprim Rash    Past Medical History:  Diagnosis Date  . Anxiety   . Arthritis    back, knees, right elbow  . Bipolar disorder ([place])   . 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 Cirrhosis ([place])   . Dental crowns present   . Depression   . Diabetes mellitus without complication ([place])   . GERD (gastroesophageal reflux disease)   . High cholesterol    no current med.  Marland Kitchen History of kidney stones   . History of migraine headaches   . Hypertension    under control with meds., has been on med. x 4 yr.  .  Hypothyroidism   . NASH (nonalcoholic steatohepatitis)   . PCOS (polycystic ovarian syndrome)   . PONV (postoperative nausea and vomiting)    also hx. of emergence delirium 2007  . Sleep apnea    uses CPAP nightly  . Trigeminal neuralgia     Past Surgical History:  Procedure Laterality Date  . ACHILLES TENDON SURGERY Left 2007  . APPENDECTOMY    . CARPAL TUNNEL RELEASE Right 01/12/2016   Procedure: RIGHT CARPAL TUNNEL RELEASE;  Surgeon: Roseanne Kaufman, MD;  Location: [facility];  Service: Orthopedics;  Laterality: Right;  . COLONOSCOPY WITH PROPOFOL N/A 04/20/2014   Procedure: COLONOSCOPY WITH PROPOFOL;  Surgeon: Arta Silence, MD;  [facility];  Service: Endoscopy;  Laterality: N/A;  . ESOPHAGOGASTRODUODENOSCOPY (EGD) WITH PROPOFOL N/A 04/20/2014   Procedure: ESOPHAGOGASTRODUODENOSCOPY (EGD) WITH PROPOFOL;  Surgeon: Arta Silence, MD;  [facility];  Service: Endoscopy;  Laterality: N/A;  . EXCISION HAGLUND'S DEFORMITY WITH ACHILLES TENDON REPAIR Right 02/25/2013   Procedure: RIGHT ACHILLES DEBRIDEMENT AND RECONSTRUCTION;  HAGLUND'S EXCISION; GASTROC RECESSION AND FLEXOR HALLUCIS LONGUS TRANSFER;  Surgeon: Wylene Simmer, MD;  Location: [facility];  Service: Orthopedics;  Laterality: Right;  . JOINT REPLACEMENT     hip  . LUMBAR LAMINECTOMY     X 3  . neck fusion    .  OVARIAN CYST SURGERY Left 1998  . PILONIDAL CYST EXCISION  1994  . RIGHT OOPHORECTOMY    . STERIOD INJECTION Left 01/12/2016   Procedure: STEROID INJECTION LEFT WRIST;  Surgeon: Roseanne Kaufman, MD;  Location: [facility];  Service: Orthopedics;  Laterality: Left;  . TOTAL HIP ARTHROPLASTY Left 03/19/2019   Procedure: LEFT TOTAL HIP ARTHROPLASTY ANTERIOR APPROACH;  Surgeon: Mcarthur Rossetti, MD;  [facility];  Service: Orthopedics;  Laterality: Left;  . TOTAL THYROIDECTOMY  2003  . UPPER GI ENDOSCOPY  01/19/2015    Social History   Socioeconomic  History  . Marital status: Married    Spouse name: Not on file  . Number of children: 0  . Years of education: 51  . Highest education level: Not on file  Occupational History  . Occupation: RN- PICU  Tobacco Use  . Smoking status: Former Smoker    Packs/day: 0.50    Years: 15.00    Pack years: 7.50    Types: Cigarettes    Quit date: 02/2019    Years since quitting: 1.0  . Smokeless tobacco: Never Used  Vaping Use  . Vaping Use: Never used  Substance and Sexual Activity  . Alcohol use: No    Alcohol/week: 0.0 standard drinks    Comment: Has not had any alcohol since 07/2014 - before this date, she rarely drink.  . Drug use: No  . Sexual activity: Yes    Partners: Male    Birth control/protection: Condom  Other Topics Concern  . Not on file  Social History Narrative   RN -    Regular exercise: no   Caffeine use: 2 x daily   Right-handed.   Lives alone.   Social Determinants of Health   Financial Resource Strain:   . Difficulty of Paying Living Expenses:   Food Insecurity:   . Worried About Charity fundraiser in the Last Year:   . Arboriculturist in the Last Year:   Transportation Needs:   . Film/video editor (Medical):   Marland Kitchen Lack of Transportation (Non-Medical):   Physical Activity:   . Days of Exercise per Week:   . Minutes of Exercise per Session:   Stress:   . Feeling of Stress :   Social Connections:   . Frequency of Communication with Friends and Family:   . Frequency of Social Gatherings with Friends and Family:   . Attends Religious Services:   . Active Member of Clubs or Organizations:   . Attends Archivist Meetings:   Marland Kitchen Marital Status:     Family History  Problem Relation Age of Onset  . Heart disease Father   . Depression Father   . Heart attack Father   . Cancer Brother        THROAT colon  . Depression Brother   . Anxiety disorder Brother   . Depression Sister   . Breast cancer Mother   . Hypertension Mother    . Depression Maternal Grandmother   . Stroke Maternal Grandfather     Health Maintenance  Topic Date Due  . Hepatitis C Screening  Never done  . FOOT EXAM  Never done  . HIV Screening  Never done  . PAP SMEAR-Modifier  Never done  . MAMMOGRAM  10/28/2017  . HEMOGLOBIN A1C  09/16/2019  . COVID-19 Vaccine (2 - Pfizer 2-dose series) 12/01/2019  . INFLUENZA VACCINE  03/05/2020  . OPHTHALMOLOGY EXAM  03/28/2020  . COLONOSCOPY  04/20/2024  . TETANUS/TDAP  01/03/2028  . PNEUMOCOCCAL POLYSACCHARIDE VACCINE AGE 51-64 HIGH RISK  Completed     ----------------------------------------------------------------------------------------------------------------------------------------------------------------------------------------------------------------- Physical Exam BP (!) 133/86 (BP Location: Left Arm, Patient Position: Sitting, Cuff Size: Normal)   Pulse 83   Temp 97.8 F (36.6 C) (Temporal)   Ht 5' 10.87" (1.8 m)   Wt (!) 302 lb (137 kg)   LMP 08/30/2018 (Approximate)   SpO2 98%   BMI 42.28 kg/m   Physical Exam Constitutional:      Appearance: Normal appearance.  HENT:     Head: Normocephalic and atraumatic.  Eyes:     General: No scleral icterus. Cardiovascular:     Rate and Rhythm: Normal rate and regular rhythm.  Pulmonary:     Effort: Pulmonary effort is normal.     Breath sounds: Normal breath sounds.  Musculoskeletal:     Cervical back: Neck supple.  Neurological:     General: No focal deficit present.     Mental Status: She is alert.  Psychiatric:        Mood and Affect: Mood normal.        Behavior: Behavior normal.     ------------------------------------------------------------------------------------------------------------------------------------------------------------------------------------------------------------------- Assessment and Plan  Diabetes mellitus, type 2 Blood sugars have been well controlled without medications.  Update a1c today.    HYPOTHYROIDISM, POSTSURGICAL Update TSH today.   Bipolar disorder ([place]) She is followed by psychiatry and reports stable symptoms.  Has upcoming [place] session.    Essential hypertension Blood pressure is at goal at for age and co-morbidities.  I recommend she continue lisinopril/hctz at current dose.  In addition they were instructed to follow a low sodium diet with regular exercise to help to maintain adequate control of blood pressure.    Yeast dermatitis Rx for nystatin powder sent in.    Meds ordered this encounter  Medications  . RABEprazole (ACIPHEX) 20 MG tablet    Sig: Take one tablet, daily.    Dispense:  90 tablet    Refill:  1  . SYNTHROID 200 MCG tablet    Sig: Take 1-1.5 tablets (200-300 mcg total) by mouth See admin instructions. Take 1 1/2 tablet (336mcg) on Sunday and Monday. Take 1 tablet (214mcg) all other days    Dispense:  135 tablet    Refill:  1  . lisinopril-hydrochlorothiazide (ZESTORETIC) 10-12.5 MG tablet    Sig: TAKE 1 TABLET BY MOUTH DAILY. **NEED VISIT** **NO FURTHER REFILLS UNTIL SEEN**    Dispense:  90 tablet    Refill:  1  . DISCONTD: nystatin (MYCOSTATIN/NYSTOP) powder    Sig: Apply 1 application topically 3 (three) times daily.    Dispense:  15 g    Refill:  0  . nystatin (MYCOSTATIN/NYSTOP) powder    Sig: Apply 1 application topically 3 (three) times daily.    Dispense:  60 g    Refill:  3    No follow-ups on file.    This visit occurred during the SARS-CoV-2 public health emergency.  Safety protocols were in place, including screening questions prior to the visit, additional usage of staff PPE, and extensive cleaning of exam room while observing appropriate contact time as indicated for disinfecting solutions.

## 2020-03-02 DIAGNOSIS — F3181 Bipolar II disorder: Secondary | ICD-10-CM | POA: Diagnosis not present

## 2020-03-02 DIAGNOSIS — F332 Major depressive disorder, recurrent severe without psychotic features: Secondary | ICD-10-CM | POA: Diagnosis not present

## 2020-03-02 LAB — COMPLETE METABOLIC PANEL WITH GFR
AG Ratio: 1.5 (calc) (ref 1.0–2.5)
ALT: 23 U/L (ref 6–29)
AST: 22 U/L (ref 10–35)
Albumin: 4.3 g/dL (ref 3.6–5.1)
Alkaline phosphatase (APISO): 88 U/L (ref 37–153)
BUN: 14 mg/dL (ref 7–25)
CO2: 25 mmol/L (ref 20–32)
Calcium: 9.5 mg/dL (ref 8.6–10.4)
Chloride: 103 mmol/L (ref 98–110)
Creat: 0.87 mg/dL (ref 0.50–1.05)
GFR, Est African American: 89 mL/min/{1.73_m2} (ref >=60)
GFR, Est Non African American: 77 mL/min/{1.73_m2} (ref >=60)
Globulin: 2.8 g/dL (calc) (ref 1.9–3.7)
Glucose, Bld: 79 mg/dL (ref 65–99)
Potassium: 4.4 mmol/L (ref 3.5–5.3)
Sodium: 137 mmol/L (ref 135–146)
Total Bilirubin: 0.4 mg/dL (ref 0.2–1.2)
Total Protein: 7.1 g/dL (ref 6.1–8.1)

## 2020-03-02 LAB — CBC
HCT: 41 % (ref 35.0–45.0)
Hemoglobin: 12.7 g/dL (ref 11.7–15.5)
MCH: 24.1 pg — ABNORMAL LOW (ref 27.0–33.0)
MCHC: 31 g/dL — ABNORMAL LOW (ref 32.0–36.0)
MCV: 77.8 fL — ABNORMAL LOW (ref 80.0–100.0)
MPV: 10.4 fL (ref 7.5–12.5)
Platelets: 285 10*3/uL (ref 140–400)
RBC: 5.27 10*6/uL — ABNORMAL HIGH (ref 3.80–5.10)
RDW: 16.7 % — ABNORMAL HIGH (ref 11.0–15.0)
WBC: 6.5 10*3/uL (ref 3.8–10.8)

## 2020-03-02 LAB — LIPID PANEL
Cholesterol: 240 mg/dL — ABNORMAL HIGH (ref <200)
HDL: 40 mg/dL — ABNORMAL LOW (ref >=50)
LDL Cholesterol (Calc): 169 mg/dL (calc) — ABNORMAL HIGH
Non-HDL Cholesterol (Calc): 200 mg/dL (calc) — ABNORMAL HIGH (ref <130)
Total CHOL/HDL Ratio: 6 (calc) — ABNORMAL HIGH (ref <5.0)
Triglycerides: 166 mg/dL — ABNORMAL HIGH (ref <150)

## 2020-03-02 LAB — TSH: TSH: 2.62 mIU/L

## 2020-03-02 LAB — HEMOGLOBIN A1C
Hgb A1c MFr Bld: 5.8 % of total Hgb — ABNORMAL HIGH (ref <5.7)
Mean Plasma Glucose: 120 (calc)
eAG (mmol/L): 6.6 (calc)

## 2020-03-03 DIAGNOSIS — F332 Major depressive disorder, recurrent severe without psychotic features: Secondary | ICD-10-CM | POA: Diagnosis not present

## 2020-03-06 DIAGNOSIS — F332 Major depressive disorder, recurrent severe without psychotic features: Secondary | ICD-10-CM | POA: Diagnosis not present

## 2020-03-07 DIAGNOSIS — F332 Major depressive disorder, recurrent severe without psychotic features: Secondary | ICD-10-CM | POA: Diagnosis not present

## 2020-03-08 DIAGNOSIS — K1370 Unspecified lesions of oral mucosa: Secondary | ICD-10-CM | POA: Diagnosis not present

## 2020-03-08 DIAGNOSIS — L438 Other lichen planus: Secondary | ICD-10-CM | POA: Diagnosis not present

## 2020-03-08 DIAGNOSIS — F332 Major depressive disorder, recurrent severe without psychotic features: Secondary | ICD-10-CM | POA: Diagnosis not present

## 2020-03-08 DIAGNOSIS — K1379 Other lesions of oral mucosa: Secondary | ICD-10-CM | POA: Diagnosis not present

## 2020-03-09 DIAGNOSIS — F332 Major depressive disorder, recurrent severe without psychotic features: Secondary | ICD-10-CM | POA: Diagnosis not present

## 2020-03-10 ENCOUNTER — Other Ambulatory Visit: Payer: Self-pay | Admitting: Family Medicine

## 2020-03-10 DIAGNOSIS — F332 Major depressive disorder, recurrent severe without psychotic features: Secondary | ICD-10-CM | POA: Diagnosis not present

## 2020-03-10 MED ORDER — ROSUVASTATIN CALCIUM 20 MG PO TABS: 20.0000 mg | ORAL_TABLET | Freq: Every day | ORAL | 3 refills | Status: DC

## 2020-03-13 DIAGNOSIS — F332 Major depressive disorder, recurrent severe without psychotic features: Secondary | ICD-10-CM | POA: Diagnosis not present

## 2020-03-14 DIAGNOSIS — F332 Major depressive disorder, recurrent severe without psychotic features: Secondary | ICD-10-CM | POA: Diagnosis not present

## 2020-03-15 DIAGNOSIS — F332 Major depressive disorder, recurrent severe without psychotic features: Secondary | ICD-10-CM | POA: Diagnosis not present

## 2020-03-16 DIAGNOSIS — F332 Major depressive disorder, recurrent severe without psychotic features: Secondary | ICD-10-CM | POA: Diagnosis not present

## 2020-03-30 DIAGNOSIS — Z20822 Contact with and (suspected) exposure to covid-19: Secondary | ICD-10-CM | POA: Diagnosis not present

## 2020-04-05 DIAGNOSIS — K0262 Dental caries on smooth surface penetrating into dentin: Secondary | ICD-10-CM | POA: Diagnosis not present

## 2020-04-05 DIAGNOSIS — K029 Dental caries, unspecified: Secondary | ICD-10-CM | POA: Diagnosis not present

## 2020-04-05 DIAGNOSIS — K047 Periapical abscess without sinus: Secondary | ICD-10-CM | POA: Diagnosis not present

## 2020-04-05 DIAGNOSIS — K1370 Unspecified lesions of oral mucosa: Secondary | ICD-10-CM | POA: Diagnosis not present

## 2020-04-13 DIAGNOSIS — G4733 Obstructive sleep apnea (adult) (pediatric): Secondary | ICD-10-CM | POA: Diagnosis not present

## 2020-04-19 DIAGNOSIS — F332 Major depressive disorder, recurrent severe without psychotic features: Secondary | ICD-10-CM | POA: Diagnosis not present

## 2020-04-26 DIAGNOSIS — F3181 Bipolar II disorder: Secondary | ICD-10-CM | POA: Diagnosis not present

## 2020-05-10 DIAGNOSIS — F3181 Bipolar II disorder: Secondary | ICD-10-CM | POA: Diagnosis not present

## 2020-05-12 ENCOUNTER — Other Ambulatory Visit (HOSPITAL_COMMUNITY): Payer: Self-pay | Admitting: *Deleted

## 2020-05-16 ENCOUNTER — Other Ambulatory Visit (HOSPITAL_COMMUNITY): Payer: Self-pay | Admitting: *Deleted

## 2020-05-17 ENCOUNTER — Other Ambulatory Visit: Payer: Self-pay | Admitting: Sports Medicine

## 2020-05-17 ENCOUNTER — Encounter: Payer: Self-pay | Admitting: Sports Medicine

## 2020-05-17 ENCOUNTER — Other Ambulatory Visit: Payer: Self-pay

## 2020-05-17 ENCOUNTER — Ambulatory Visit (INDEPENDENT_AMBULATORY_CARE_PROVIDER_SITE_OTHER): Payer: 59 | Admitting: Sports Medicine

## 2020-05-17 ENCOUNTER — Ambulatory Visit (INDEPENDENT_AMBULATORY_CARE_PROVIDER_SITE_OTHER): Payer: 59

## 2020-05-17 DIAGNOSIS — M19012 Primary osteoarthritis, left shoulder: Secondary | ICD-10-CM | POA: Diagnosis not present

## 2020-05-17 DIAGNOSIS — G8929 Other chronic pain: Secondary | ICD-10-CM

## 2020-05-17 DIAGNOSIS — M25512 Pain in left shoulder: Secondary | ICD-10-CM

## 2020-05-17 HISTORY — DX: Other chronic pain: G89.29

## 2020-05-17 MED ORDER — LISINOPRIL-HYDROCHLOROTHIAZIDE 10-12.5 MG PO TABS: ORAL_TABLET | ORAL | 1 refills | Status: DC

## 2020-05-17 NOTE — Progress Notes (Signed)
    Procedures performed today:    Procedure: Real-time Ultrasound Guided injection of the left subacromial bursa Device: Samsung HS60  Verbal informed consent obtained.  Time-out conducted.  Noted no overlying erythema, induration, or other signs of local infection.  Skin prepped in a sterile fashion.  Local anesthesia: Topical Ethyl chloride.  With sterile technique and under real time ultrasound guidance:  I advanced a 25-gauge needle into the subacromial bursa, I did note an intact rotator cuff with partial-thickness tearing, there is also calcific tendinopathy in the supraspinatus, once into the bursa I injected 1 cc Kenalog 40, 1 cc lidocaine, 1 cc bupivacaine injected easily Completed without difficulty  Pain immediately resolved suggesting accurate placement of the medication.  Advised to call if fevers/chills, erythema, induration, drainage, or persistent bleeding.  Images permanently stored and available for review in PACS.  Impression: Technically successful ultrasound guided injection.  Independent interpretation of notes and tests performed by another provider:   None.  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 On exam she has a positive speeds test, she also has rotator cuff signs, pain is waking her from sleep. We will go ahead and inject her subacromial bursa, add x-rays, home rehab exercises, return to see me in 4 to 6 weeks, MRI if no better.    ___________________________________________ [name], M.D., ABFM., CAQSM. Primary Care and [place] Instructor of [employer] [facility] [employer]

## 2020-05-17 NOTE — Assessment & Plan Note (Signed)
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 On exam she has a positive speeds test, she also has rotator cuff signs, pain is waking her from sleep. We will go ahead and inject her subacromial bursa, add x-rays, home rehab exercises, return to see me in 4 to 6 weeks, MRI if no better.

## 2020-05-17 NOTE — Addendum Note (Signed)
Addended by: Silverio Decamp on: 05/17/2020 04:55 PM   Modules accepted: Orders

## 2020-05-24 DIAGNOSIS — F3181 Bipolar II disorder: Secondary | ICD-10-CM | POA: Diagnosis not present

## 2020-05-29 DIAGNOSIS — F331 Major depressive disorder, recurrent, moderate: Secondary | ICD-10-CM | POA: Diagnosis not present

## 2020-05-29 DIAGNOSIS — F419 Anxiety disorder, unspecified: Secondary | ICD-10-CM | POA: Diagnosis not present

## 2020-05-31 ENCOUNTER — Other Ambulatory Visit (HOSPITAL_COMMUNITY): Payer: Self-pay

## 2020-05-31 DIAGNOSIS — K1370 Unspecified lesions of oral mucosa: Secondary | ICD-10-CM | POA: Diagnosis not present

## 2020-05-31 DIAGNOSIS — K117 Disturbances of salivary secretion: Secondary | ICD-10-CM | POA: Diagnosis not present

## 2020-05-31 DIAGNOSIS — B370 Candidal stomatitis: Secondary | ICD-10-CM | POA: Diagnosis not present

## 2020-05-31 DIAGNOSIS — F3181 Bipolar II disorder: Secondary | ICD-10-CM | POA: Diagnosis not present

## 2020-05-31 DIAGNOSIS — L438 Other lichen planus: Secondary | ICD-10-CM | POA: Diagnosis not present

## 2020-06-07 DIAGNOSIS — F3181 Bipolar II disorder: Secondary | ICD-10-CM | POA: Diagnosis not present

## 2020-06-21 ENCOUNTER — Encounter: Payer: Self-pay | Admitting: Family Medicine

## 2020-06-21 ENCOUNTER — Ambulatory Visit (INDEPENDENT_AMBULATORY_CARE_PROVIDER_SITE_OTHER): Payer: 59 | Admitting: Family Medicine

## 2020-06-21 DIAGNOSIS — F3181 Bipolar II disorder: Secondary | ICD-10-CM | POA: Diagnosis not present

## 2020-06-21 DIAGNOSIS — R221 Localized swelling, mass and lump, neck: Secondary | ICD-10-CM | POA: Diagnosis not present

## 2020-06-21 NOTE — Progress Notes (Signed)
Tracy [name] - 54 y.o. female MRN [id number]  Date of birth: October 08, 1965  Subjective Chief Complaint  Patient presents with  . Facial Swelling    HPI  Tracy [name] is a 54 y.o. female here today with complaint of swelling along L neck and jaw area. She first noticed this a few days ago.  She did have some mild tenderness over the area.  Typically isn't too bad first thing in the mornig but becomes larger throughout the day.  She has not had any fever, chills, sore throat, ear/jaw pain.  She is having a tooth extraction and excision of lichen planus but this on the R side of her mouth but hasn't had any tooth problems on the L side.   ROS:  A comprehensive ROS was completed and negative except as noted per HPI  Allergies  Allergen Reactions  . Sulfa Antibiotics Itching, Swelling and Rash  . Bupropion Hcl Itching and Rash  . Codeine Itching  . Sulfamethoxazole-Trimethoprim Rash    Past Medical History:  Diagnosis Date  . Anxiety   . Arthritis    back, knees, right elbow  . Bipolar disorder ([place])   . 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 Cirrhosis ([place])   . Dental crowns present   . Depression   . Diabetes mellitus without complication ([place])   . GERD (gastroesophageal reflux disease)   . High cholesterol    no current med.  Marland Kitchen History of kidney stones   . History of migraine headaches   . Hypertension    under control with meds., has been on med. x 4 yr.  . Hypothyroidism   . NASH (nonalcoholic steatohepatitis)   . PCOS (polycystic ovarian syndrome)   . PONV (postoperative nausea and vomiting)    also hx. of emergence delirium 2007  . Sleep apnea    uses CPAP nightly  . Trigeminal neuralgia     Past Surgical History:  Procedure Laterality Date  . ACHILLES TENDON SURGERY Left 2007  . APPENDECTOMY    . CARPAL TUNNEL RELEASE Right 01/12/2016   Procedure: RIGHT CARPAL TUNNEL RELEASE;  Surgeon: Roseanne Kaufman, MD;  Location: [facility];  Service:  Orthopedics;  Laterality: Right;  . COLONOSCOPY WITH PROPOFOL N/A 04/20/2014   Procedure: COLONOSCOPY WITH PROPOFOL;  Surgeon: Arta Silence, MD;  [facility];  Service: Endoscopy;  Laterality: N/A;  . ESOPHAGOGASTRODUODENOSCOPY (EGD) WITH PROPOFOL N/A 04/20/2014   Procedure: ESOPHAGOGASTRODUODENOSCOPY (EGD) WITH PROPOFOL;  Surgeon: Arta Silence, MD;  [facility];  Service: Endoscopy;  Laterality: N/A;  . EXCISION HAGLUND'S DEFORMITY WITH ACHILLES TENDON REPAIR Right 02/25/2013   Procedure: RIGHT ACHILLES DEBRIDEMENT AND RECONSTRUCTION;  HAGLUND'S EXCISION; GASTROC RECESSION AND FLEXOR HALLUCIS LONGUS TRANSFER;  Surgeon: Wylene Simmer, MD;  Location: [facility];  Service: Orthopedics;  Laterality: Right;  . JOINT REPLACEMENT     hip  . LUMBAR LAMINECTOMY     X 3  . neck fusion    . OVARIAN CYST SURGERY Left 1998  . PILONIDAL CYST EXCISION  1994  . RIGHT OOPHORECTOMY    . STERIOD INJECTION Left 01/12/2016   Procedure: STEROID INJECTION LEFT WRIST;  Surgeon: Roseanne Kaufman, MD;  Location: [facility];  Service: Orthopedics;  Laterality: Left;  . TOTAL HIP ARTHROPLASTY Left 03/19/2019   Procedure: LEFT TOTAL HIP ARTHROPLASTY ANTERIOR APPROACH;  Surgeon: Mcarthur Rossetti, MD;  [facility];  Service: Orthopedics;  Laterality: Left;  . TOTAL THYROIDECTOMY  2003  .  UPPER GI ENDOSCOPY  01/19/2015    Social History   Socioeconomic History  . Marital status: Legally Separated    Spouse name: Not on file  . Number of children: 0  . Years of education: 36  . Highest education level: Not on file  Occupational History  . Occupation: RN- PICU  Tobacco Use  . Smoking status: Former Smoker    Packs/day: 0.50    Years: 15.00    Pack years: 7.50    Types: Cigarettes    Quit date: 02/2019    Years since quitting: 1.3  . Smokeless tobacco: Never Used  Vaping Use  . Vaping Use: Never used  Substance and Sexual Activity  . Alcohol use: No     Alcohol/week: 0.0 standard drinks    Comment: Has not had any alcohol since 07/2014 - before this date, she rarely drink.  . Drug use: No  . Sexual activity: Yes    Partners: Male    Birth control/protection: Condom  Other Topics Concern  . Not on file  Social History Narrative   RN - [employer]   Regular exercise: no   Caffeine use: 2 x daily   Right-handed.   Lives alone.   Social Determinants of Health   Financial Resource Strain:   . Difficulty of Paying Living Expenses: Not on file  Food Insecurity:   . Worried About Charity fundraiser in the Last Year: Not on file  . Ran Out of Food in the Last Year: Not on file  Transportation Needs:   . Lack of Transportation (Medical): Not on file  . Lack of Transportation (Non-Medical): Not on file  Physical Activity:   . Days of Exercise per Week: Not on file  . Minutes of Exercise per Session: Not on file  Stress:   . Feeling of Stress : Not on file  Social Connections:   . Frequency of Communication with Friends and Family: Not on file  . Frequency of Social Gatherings with Friends and Family: Not on file  . Attends Religious Services: Not on file  . Active Member of Clubs or Organizations: Not on file  . Attends Archivist Meetings: Not on file  . Marital Status: Not on file    Family History  Problem Relation Age of Onset  . Heart disease Father   . Depression Father   . Heart attack Father   . Cancer Brother        THROAT colon  . Depression Brother   . Anxiety disorder Brother   . Depression Sister   . Breast cancer Mother   . Hypertension Mother   . Depression Maternal Grandmother   . Stroke Maternal Grandfather     Health Maintenance  Topic Date Due  . Hepatitis C Screening  Never done  . FOOT EXAM  Never done  . HIV Screening  Never done  . PAP SMEAR-Modifier  Never done  . MAMMOGRAM  10/28/2017  . COVID-19 Vaccine (2 - Pfizer 2-dose series) 12/01/2019  . INFLUENZA VACCINE  03/05/2020   . OPHTHALMOLOGY EXAM  03/28/2020  . HEMOGLOBIN A1C  09/01/2020  . COLONOSCOPY  04/20/2024  . TETANUS/TDAP  01/03/2028  . PNEUMOCOCCAL POLYSACCHARIDE VACCINE AGE 15-64 HIGH RISK  Completed     ----------------------------------------------------------------------------------------------------------------------------------------------------------------------------------------------------------------- Physical Exam BP 136/83 (BP Location: Left Arm, Patient Position: Sitting, Cuff Size: Normal)   Pulse 70   Wt (!) 318 lb 4.8 oz (144.4 kg)   LMP 08/30/2018 (Approximate)   SpO2  94%   BMI 44.56 kg/m   Physical Exam Constitutional:      Appearance: Normal appearance.  HENT:     Head: Normocephalic and atraumatic.     Right Ear: Tympanic membrane normal.     Left Ear: Tympanic membrane normal.  Neck:     Comments: Slight fullness along parotid area on L compared to R.  This extends to the upper neck as well.  This is non tender and non-pulsatile.  Cardiovascular:     Rate and Rhythm: Normal rate and regular rhythm.  Pulmonary:     Effort: Pulmonary effort is normal.     Breath sounds: Normal breath sounds.  Neurological:     Mental Status: She is alert.     ------------------------------------------------------------------------------------------------------------------------------------------------------------------------------------------------------------------- Assessment and Plan  Neck swelling She is having some mild fullness around the L jaw and neck area.  Possible sialoadenitis. Will try to manage conservatively.  Discussed if not improving we'll try course of antibiotics and consider imaging of area.   She is aware to contact the clinic if having new or worsening symptoms.     No orders of the defined types were placed in this encounter.   No follow-ups on file.    This visit occurred during the SARS-CoV-2 public health emergency.  Safety protocols were in  place, including screening questions prior to the visit, additional usage of staff PPE, and extensive cleaning of exam room while observing appropriate contact time as indicated for disinfecting solutions.

## 2020-06-21 NOTE — Assessment & Plan Note (Addendum)
She is having some mild fullness around the L jaw and neck area.  Possible sialoadenitis. Will try to manage conservatively.  Discussed if not improving we'll try course of antibiotics and consider imaging of area.   She is aware to contact the clinic if having new or worsening symptoms.

## 2020-06-21 NOTE — Patient Instructions (Signed)
1. Follow these instructions every few hours: ? Suck on a lemon candy to stimulate the flow of saliva. ? Put a warm compress over the gland. ? Gently massage the gland. 2. Rinse your mouth with a salt-water mixture 3-4 times a day or as needed. To make a salt-water mixture, completely dissolve -1 tsp of salt in 1 cup of warm water.  If not improving let me know and we'll try a course of antibiotics.  If this continues to not improve or if symptoms worsen we'll need to get imaging of this area.

## 2020-06-23 ENCOUNTER — Other Ambulatory Visit (HOSPITAL_COMMUNITY): Payer: Self-pay | Admitting: *Deleted

## 2020-06-23 DIAGNOSIS — I10 Essential (primary) hypertension: Secondary | ICD-10-CM | POA: Diagnosis not present

## 2020-06-23 DIAGNOSIS — K047 Periapical abscess without sinus: Secondary | ICD-10-CM | POA: Diagnosis not present

## 2020-06-23 DIAGNOSIS — Z6841 Body Mass Index (BMI) 40.0 and over, adult: Secondary | ICD-10-CM

## 2020-06-23 DIAGNOSIS — K1370 Unspecified lesions of oral mucosa: Secondary | ICD-10-CM | POA: Diagnosis not present

## 2020-06-23 DIAGNOSIS — L439 Lichen planus, unspecified: Secondary | ICD-10-CM | POA: Diagnosis not present

## 2020-06-23 DIAGNOSIS — M1990 Unspecified osteoarthritis, unspecified site: Secondary | ICD-10-CM | POA: Diagnosis not present

## 2020-06-23 DIAGNOSIS — K029 Dental caries, unspecified: Secondary | ICD-10-CM | POA: Diagnosis not present

## 2020-06-23 DIAGNOSIS — K1379 Other lesions of oral mucosa: Secondary | ICD-10-CM | POA: Diagnosis not present

## 2020-06-23 DIAGNOSIS — K121 Other forms of stomatitis: Secondary | ICD-10-CM | POA: Diagnosis not present

## 2020-06-23 DIAGNOSIS — F3181 Bipolar II disorder: Secondary | ICD-10-CM | POA: Diagnosis not present

## 2020-06-23 DIAGNOSIS — K219 Gastro-esophageal reflux disease without esophagitis: Secondary | ICD-10-CM | POA: Diagnosis not present

## 2020-06-23 DIAGNOSIS — E119 Type 2 diabetes mellitus without complications: Secondary | ICD-10-CM | POA: Diagnosis not present

## 2020-06-23 HISTORY — DX: Body Mass Index (BMI) 40.0 and over, adult: Z684

## 2020-06-23 HISTORY — DX: Morbid (severe) obesity due to excess calories: E66.01

## 2020-06-28 ENCOUNTER — Ambulatory Visit: Payer: 59 | Admitting: Sports Medicine

## 2020-07-05 DIAGNOSIS — F3181 Bipolar II disorder: Secondary | ICD-10-CM | POA: Diagnosis not present

## 2020-07-06 DIAGNOSIS — G4733 Obstructive sleep apnea (adult) (pediatric): Secondary | ICD-10-CM | POA: Diagnosis not present

## 2020-07-12 DIAGNOSIS — K1370 Unspecified lesions of oral mucosa: Secondary | ICD-10-CM | POA: Diagnosis not present

## 2020-07-19 DIAGNOSIS — F3181 Bipolar II disorder: Secondary | ICD-10-CM | POA: Diagnosis not present

## 2020-07-31 ENCOUNTER — Other Ambulatory Visit (HOSPITAL_COMMUNITY): Payer: Self-pay | Admitting: *Deleted

## 2020-08-07 ENCOUNTER — Encounter: Payer: Self-pay | Admitting: Family Medicine

## 2020-08-09 DIAGNOSIS — F3181 Bipolar II disorder: Secondary | ICD-10-CM | POA: Diagnosis not present

## 2020-08-10 ENCOUNTER — Other Ambulatory Visit (HOSPITAL_COMMUNITY): Payer: Self-pay

## 2020-08-10 ENCOUNTER — Other Ambulatory Visit: Payer: Self-pay

## 2020-08-10 DIAGNOSIS — L438 Other lichen planus: Secondary | ICD-10-CM | POA: Diagnosis not present

## 2020-08-10 MED ORDER — SYNTHROID 200 MCG PO TABS: 200.0000 ug | ORAL_TABLET | ORAL | 1 refills | Status: DC

## 2020-08-18 ENCOUNTER — Other Ambulatory Visit (HOSPITAL_COMMUNITY): Payer: Self-pay | Admitting: *Deleted

## 2020-08-22 ENCOUNTER — Other Ambulatory Visit: Payer: Self-pay | Admitting: Osteopathic Medicine

## 2020-08-22 ENCOUNTER — Other Ambulatory Visit: Payer: Self-pay | Admitting: Family Medicine

## 2020-08-22 DIAGNOSIS — R1013 Epigastric pain: Secondary | ICD-10-CM

## 2020-08-22 DIAGNOSIS — R112 Nausea with vomiting, unspecified: Secondary | ICD-10-CM

## 2020-08-22 IMAGING — CT CT ANGIO CHEST
2 of 7 series · 17 of 46 positions shown · IV contrast (APPLIED)
Comparison: None.

CLINICAL DATA: Chest pain. Several weeks postop from hip joint
replacement. Positive D-dimer. Suspected pulmonary embolism.

EXAM:
CT ANGIOGRAPHY CHEST WITH CONTRAST
TECHNIQUE: Multidetector CT imaging of the chest was performed using the
standard protocol during bolus administration of intravenous
contrast. Multiplanar CT image reconstructions and MIPs were
obtained to evaluate the vascular anatomy.
CONTRAST:  75mL OMNIPAQUE IOHEXOL 350 MG/ML SOLN

[Series 7: thins · axial · 0.87mm/px · z∈[-284,-56]mm · 14 of 368 slices shown]
[im 21/368  lung]
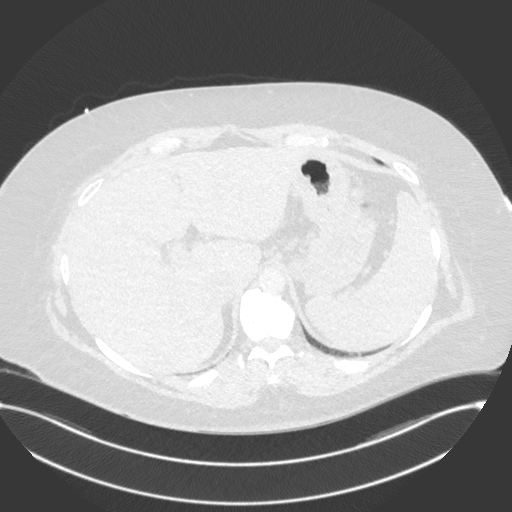
[im 41/368  soft-tissue]
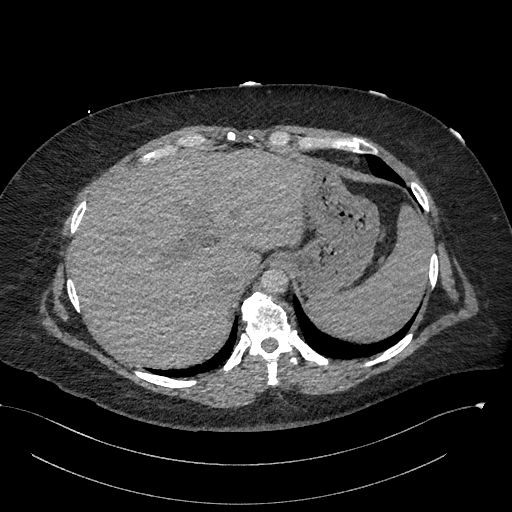
[im 82/368  lung]
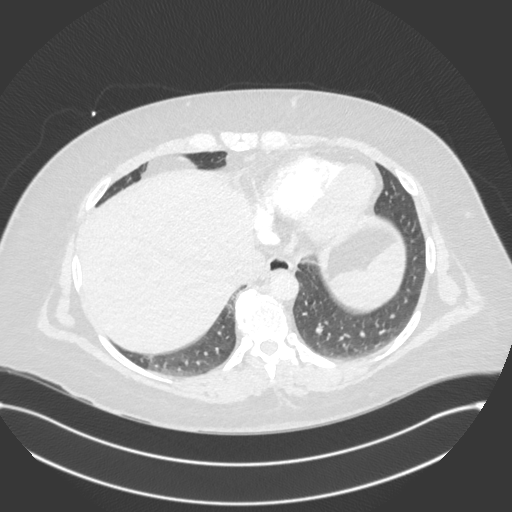
[im 102/368  soft-tissue]
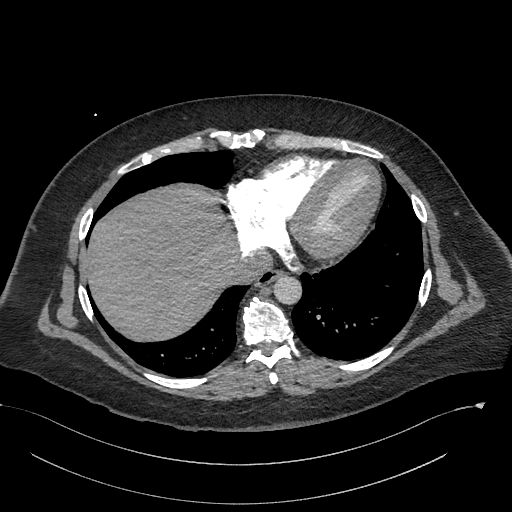
[im 123/368  lung]
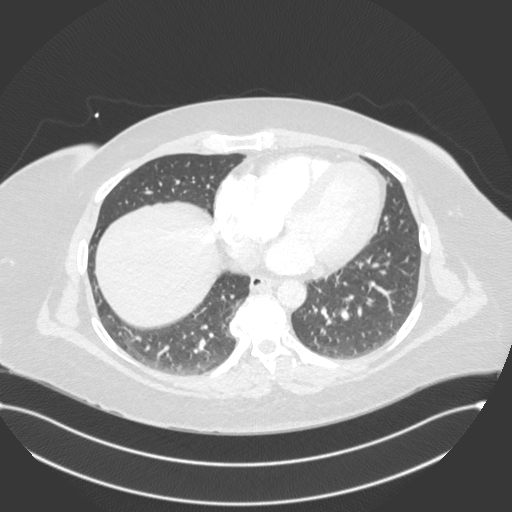
[im 143/368  soft-tissue]
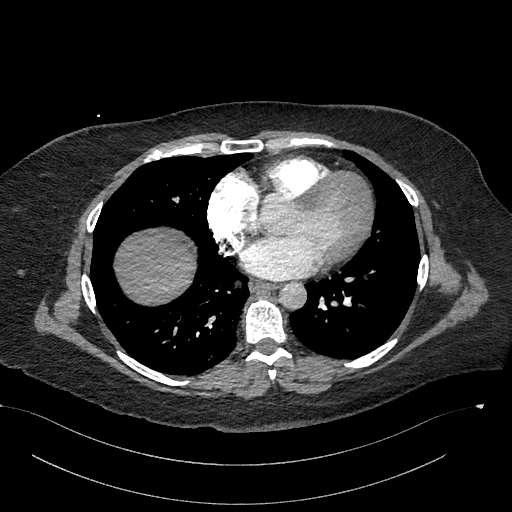
[im 164/368  lung]
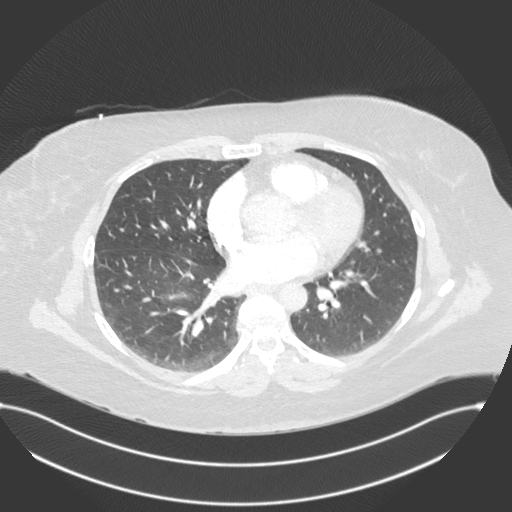
[im 204/368  soft-tissue]
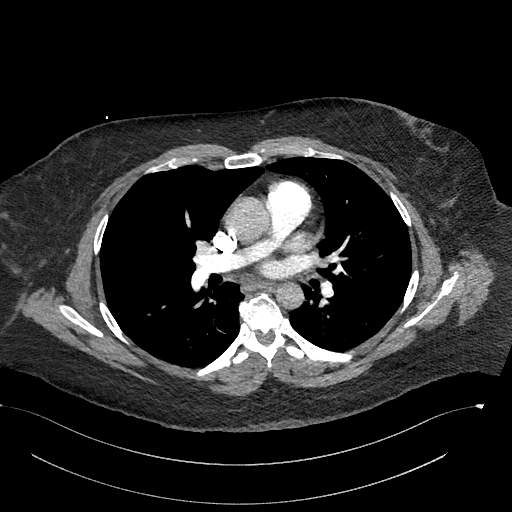
[im 225/368  lung]
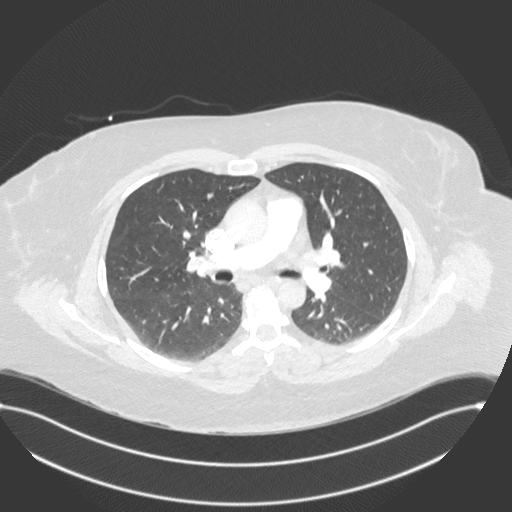
[im 245/368  soft-tissue]
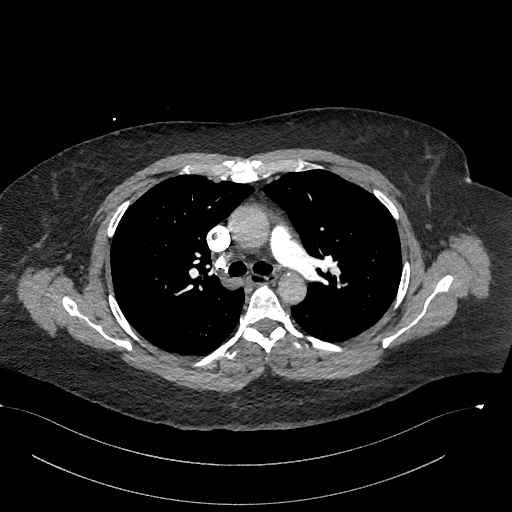
[im 266/368  lung]
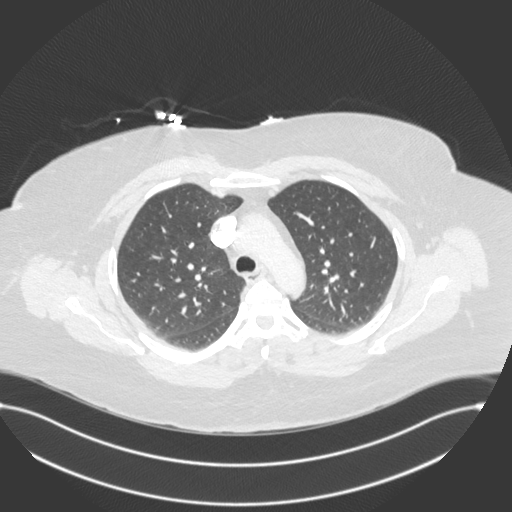
[im 286/368  soft-tissue]
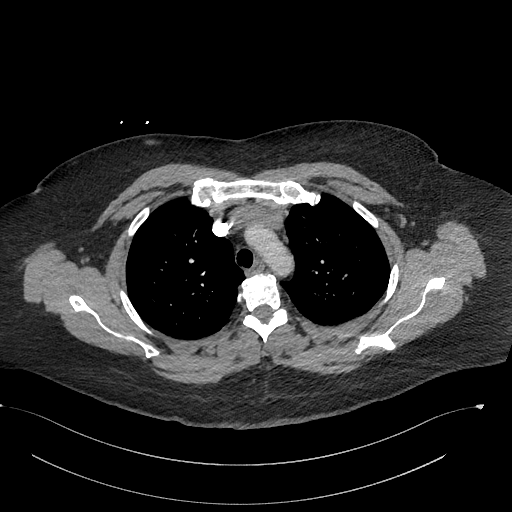
[im 327/368  lung]
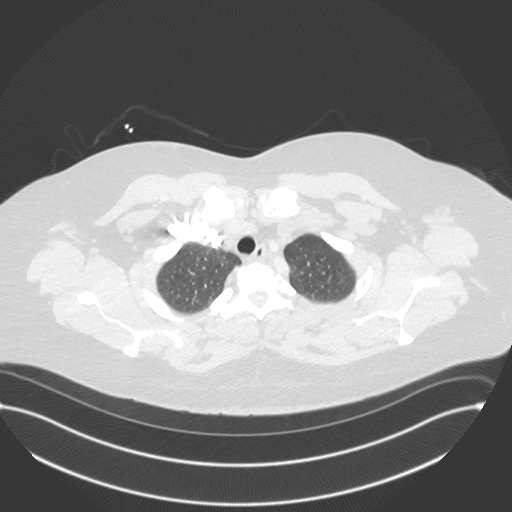
[im 347/368  soft-tissue]
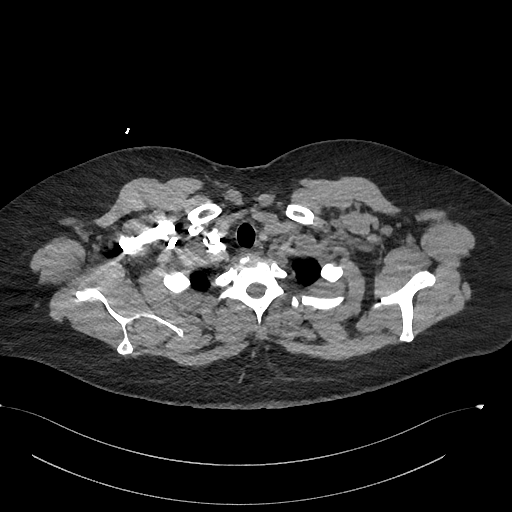

[Series 8: cor · coronal · 0.54mm/px · 3 of 164 slices shown]
[im 41/164  soft-tissue]
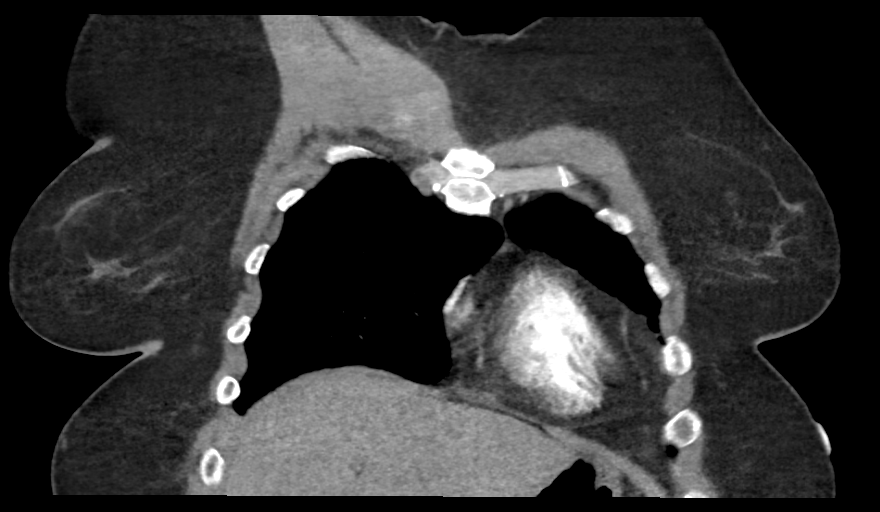
[im 82/164  soft-tissue]
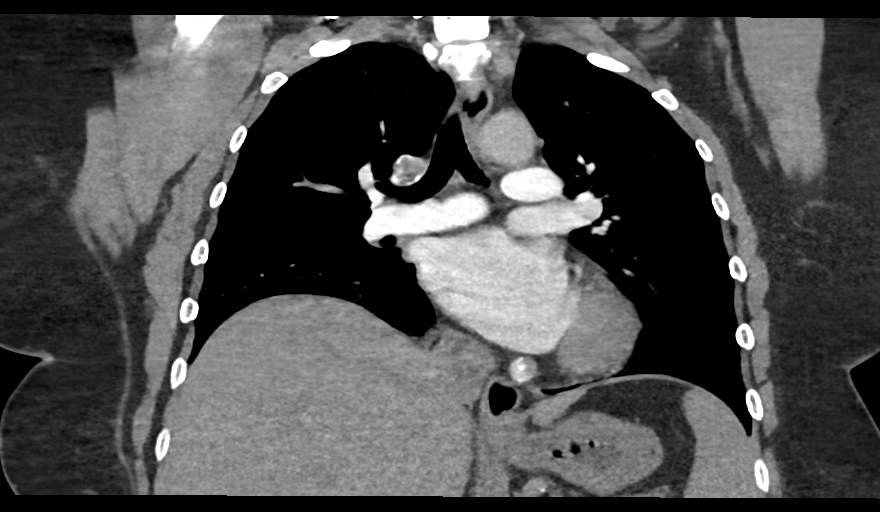
[im 123/164  soft-tissue]
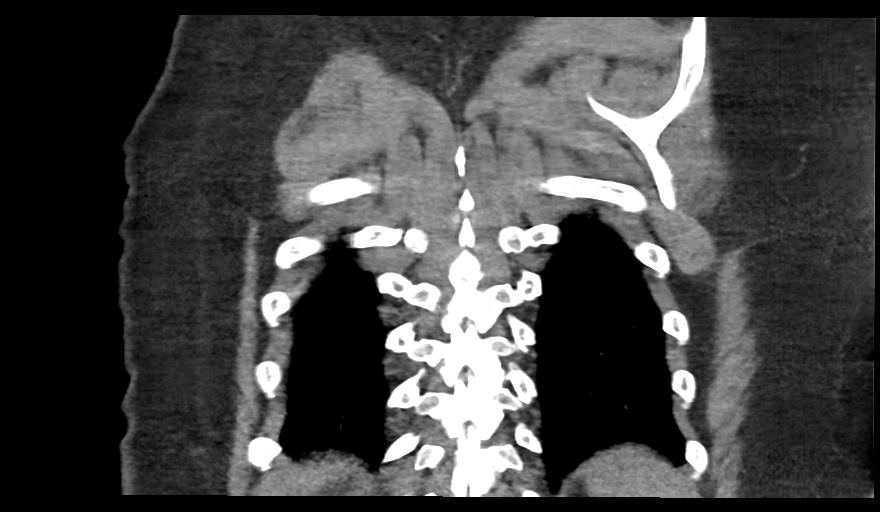

[17 of 46 positions shown; findings below may reference images not displayed]

FINDINGS: Cardiovascular: Satisfactory opacification of pulmonary arteries
noted, and no pulmonary emboli identified. No evidence of thoracic
aortic dissection or aneurysm.

Mediastinum/Nodes: No masses or pathologically enlarged lymph nodes
identified.

Lungs/Pleura: No pulmonary mass, infiltrate, or effusion.

Upper abdomen: No acute findings.

Musculoskeletal: No suspicious bone lesions identified.

Review of the MIP images confirms the above findings.
IMPRESSION: Negative. No evidence of pulmonary embolism or other active disease.

## 2020-08-23 ENCOUNTER — Encounter: Payer: Self-pay | Admitting: Physician Assistant

## 2020-08-23 ENCOUNTER — Telehealth (INDEPENDENT_AMBULATORY_CARE_PROVIDER_SITE_OTHER): Payer: 59 | Admitting: Physician Assistant

## 2020-08-23 DIAGNOSIS — Z87440 Personal history of urinary (tract) infections: Secondary | ICD-10-CM

## 2020-08-23 DIAGNOSIS — R339 Retention of urine, unspecified: Secondary | ICD-10-CM

## 2020-08-23 DIAGNOSIS — R300 Dysuria: Secondary | ICD-10-CM | POA: Diagnosis not present

## 2020-08-23 DIAGNOSIS — L438 Other lichen planus: Secondary | ICD-10-CM | POA: Diagnosis not present

## 2020-08-23 MED ORDER — CIPROFLOXACIN HCL 500 MG PO TABS: 500.0000 mg | ORAL_TABLET | Freq: Two times a day (BID) | ORAL | 0 refills | Status: DC

## 2020-08-23 MED ORDER — PHENAZOPYRIDINE HCL 200 MG PO TABS: 200.0000 mg | ORAL_TABLET | Freq: Three times a day (TID) | ORAL | 0 refills | Status: AC

## 2020-08-23 NOTE — Progress Notes (Signed)
Pt. Stated she has bladder pressure before and  after urination having cramps in her legs

## 2020-08-23 NOTE — Progress Notes (Signed)
Patient ID: Tracy [name], female   DOB: 12/14/1965, 55 y.o.   MRN: [id number] .Marland KitchenVirtual Visit via Video Note  I connected with Tracy [name] on 08/23/20 at  7:30 AM EST by a video enabled telemedicine application and verified that I am speaking with the correct person using two identifiers.  Location: Patient: home Provider: clinic  .Marland KitchenParticipating in visit:  Patient: Tracy [name]  Provider: Iran Planas PA-C   I discussed the limitations of evaluation and management by telemedicine and the availability of in person appointments. The patient expressed understanding and agreed to proceed.  History of Present Illness: Patient is a 55 year old obese female who calls into the clinic with urinary symptoms for 4 days.  She has a history of UTI but has not had one in over a year.  Her symptoms started with dysuria but that has resolved and now she is left with urinary pressure and retention.  She feels like she has to go but cannot go.  She feels a lot of bladder pressure.  She denies any gross blood in urine.  She denies any fever, chills, nausea, vomiting, flank pain.  She has not tried anything to make it better other than drinking more water.  .. Active Ambulatory Problems    Diagnosis Date Noted  . HYPOTHYROIDISM, POSTSURGICAL 10/02/2007  . POLYCYSTIC OVARIAN DISEASE 10/02/2007  . Elevated cholesterol 10/02/2007  . DEPRESSION 10/02/2007  . Migraine 10/02/2007  . Essential hypertension 10/02/2007  . RENAL CALCULUS, HX OF 10/02/2007  . TOBACCO USE, QUIT 02/08/2010  . Cystic teratoma   . PCOS (polycystic ovarian syndrome)   . 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 Diabetes mellitus, type 2 ([place]) 06/10/2013  . Obstructive sleep apnea 06/27/2014  . Morbid obesity ([place]) 10/31/2014  . Anxiety and depression 10/31/2014  . Insulin resistance 02/09/2015  . 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 Acanthosis nigricans, acquired 02/09/2015  . Nonalcoholic fatty liver  disease 02/09/2015  . Female hirsutism 02/09/2015  . Oligomenorrhea 02/09/2015  . Infertility associated with anovulation 02/09/2015  . Severe episode of recurrent major depressive disorder, without psychotic features ([place])   . History of posttraumatic stress disorder (PTSD)   . GAD (generalized anxiety disorder)   . MDD (major depressive disorder) 06/06/2015  . Carpal tunnel syndrome 09/06/2015  . Bipolar disorder ([place]) 11/28/2015  . Borderline personality disorder ([place]) 11/28/2015  . Trigeminal neuralgia 01/18/2016  . Lower back pain 07/18/2017  . Hemorrhoids, internal 07/18/2017  . Encounter for general adult medical examination with abnormal findings 01/04/2018  . Vitamin D deficiency 01/04/2018  . Arthritis of left hip 06/25/2018  . Greater trochanteric bursitis of left hip 07/24/2018  . Unilateral primary osteoarthritis, left hip 02/10/2019  . 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 Pain in left knee 01/21/2018  . Healthcare maintenance 04/22/2019  . UTI (urinary tract infection) 04/22/2019  . Facial rash 06/16/2019  . Oral mucosal lesion 06/17/2019  . Yeast dermatitis 03/01/2020  . Chronic left shoulder pain 05/17/2020  . Neck swelling 06/21/2020   Resolved Ambulatory Problems    Diagnosis Date Noted  . HYPERGLYCEMIA 10/02/2007  . UTI'S, HX OF 10/02/2007  . Infertility, female   . Chest pain 06/27/2014  . Chest pain at rest 10/31/2014  . Prediabetes 02/09/2015  . Hepatic cirrhosis ([place]) 02/09/2015  . Suicidal ideation 06/05/2015  . Dog bite of buttock, initial encounter 06/16/2019   Past Medical History:  Diagnosis Date  . Anxiety   . Arthritis   . Cancer ([place])   .  Cirrhosis ([place])   . Dental crowns present   . Depression   . Diabetes mellitus without complication ([place])   . GERD (gastroesophageal reflux disease)   . High cholesterol   . History of kidney stones   . History of migraine headaches   . Hypertension   . Hypothyroidism   . NASH  (nonalcoholic steatohepatitis)   . PONV (postoperative nausea and vomiting)   . Sleep apnea    Reviewed med, allergy, problem list.     Observations/Objective: No acute distress Normal mood and appearance.  Normal breathing.   Marland Kitchen.There were no vitals filed for this visit. There is no height or weight on file to calculate BMI.     Assessment and Plan: Marland KitchenMarland KitchenDiagnoses and all orders for this visit:  Urinary retention -     ciprofloxacin (CIPRO) 500 MG tablet; Take 1 tablet (500 mg total) by mouth 2 (two) times daily. -     phenazopyridine (PYRIDIUM) 200 MG tablet; Take 1 tablet (200 mg total) by mouth 3 (three) times daily for 2 days.  Dysuria -     ciprofloxacin (CIPRO) 500 MG tablet; Take 1 tablet (500 mg total) by mouth 2 (two) times daily. -     phenazopyridine (PYRIDIUM) 200 MG tablet; Take 1 tablet (200 mg total) by mouth 3 (three) times daily for 2 days.  History of UTI -     ciprofloxacin (CIPRO) 500 MG tablet; Take 1 tablet (500 mg total) by mouth 2 (two) times daily. -     phenazopyridine (PYRIDIUM) 200 MG tablet; Take 1 tablet (200 mg total) by mouth 3 (three) times daily for 2 days.   Patient was treated empirically for UTI.  I did look back at her last urinary culture which was positive for Proteus bacteria.  It was resistant to Macrobid so sent Cipro for 3 days.  Her symptoms do seem uncomplicated.  Instructed patient to drink more water.  She can also add Pyridium for some bladder inflammation.  This could turn her urine orange.  If she still having any urinary pressure or symptoms please return to clinic to provide a urine sample for culture.    Follow Up Instructions:    I discussed the assessment and treatment plan with the patient. The patient was provided an opportunity to ask questions and all were answered. The patient agreed with the plan and demonstrated an understanding of the instructions.   The patient was advised to call back or seek an in-person  evaluation if the symptoms worsen or if the condition fails to improve as anticipated.    Iran Planas, PA-C

## 2020-08-24 DIAGNOSIS — F3181 Bipolar II disorder: Secondary | ICD-10-CM | POA: Diagnosis not present

## 2020-08-31 ENCOUNTER — Ambulatory Visit: Payer: 59 | Admitting: Family Medicine

## 2020-09-06 DIAGNOSIS — F3181 Bipolar II disorder: Secondary | ICD-10-CM | POA: Diagnosis not present

## 2020-09-08 ENCOUNTER — Other Ambulatory Visit (HOSPITAL_COMMUNITY): Payer: Self-pay | Admitting: *Deleted

## 2020-09-11 ENCOUNTER — Other Ambulatory Visit (HOSPITAL_COMMUNITY): Payer: Self-pay | Admitting: *Deleted

## 2020-09-12 DIAGNOSIS — F3181 Bipolar II disorder: Secondary | ICD-10-CM | POA: Diagnosis not present

## 2020-09-14 ENCOUNTER — Other Ambulatory Visit (HOSPITAL_COMMUNITY): Payer: Self-pay

## 2020-09-14 DIAGNOSIS — L438 Other lichen planus: Secondary | ICD-10-CM | POA: Diagnosis not present

## 2020-09-14 DIAGNOSIS — K117 Disturbances of salivary secretion: Secondary | ICD-10-CM | POA: Diagnosis not present

## 2020-09-15 ENCOUNTER — Other Ambulatory Visit (HOSPITAL_COMMUNITY): Payer: Self-pay | Admitting: *Deleted

## 2020-09-15 DIAGNOSIS — F419 Anxiety disorder, unspecified: Secondary | ICD-10-CM | POA: Diagnosis not present

## 2020-09-15 DIAGNOSIS — F333 Major depressive disorder, recurrent, severe with psychotic symptoms: Secondary | ICD-10-CM | POA: Diagnosis not present

## 2020-09-20 DIAGNOSIS — F3181 Bipolar II disorder: Secondary | ICD-10-CM | POA: Diagnosis not present

## 2020-09-21 ENCOUNTER — Ambulatory Visit (INDEPENDENT_AMBULATORY_CARE_PROVIDER_SITE_OTHER): Payer: 59 | Admitting: Family Medicine

## 2020-09-21 ENCOUNTER — Other Ambulatory Visit: Payer: Self-pay

## 2020-09-21 ENCOUNTER — Encounter: Payer: Self-pay | Admitting: Family Medicine

## 2020-09-21 VITALS — BP 119/76 | HR 76 | Wt 341.1 lb

## 2020-09-21 DIAGNOSIS — R0600 Dyspnea, unspecified: Secondary | ICD-10-CM

## 2020-09-21 DIAGNOSIS — E119 Type 2 diabetes mellitus without complications: Secondary | ICD-10-CM | POA: Diagnosis not present

## 2020-09-21 DIAGNOSIS — M7989 Other specified soft tissue disorders: Secondary | ICD-10-CM | POA: Diagnosis not present

## 2020-09-21 HISTORY — DX: Other specified soft tissue disorders: M79.89

## 2020-09-21 MED ORDER — FUROSEMIDE 20 MG PO TABS: 20.0000 mg | ORAL_TABLET | Freq: Every day | ORAL | 3 refills | Status: DC | PRN

## 2020-09-21 MED ORDER — PHENTERMINE HCL 37.5 MG PO CAPS: 37.5000 mg | ORAL_CAPSULE | ORAL | 1 refills | Status: DC

## 2020-09-21 NOTE — Assessment & Plan Note (Addendum)
Discussed trial of weight loss medication and will start phentermine.  Side effects reviewed.  She'll check back in with me in about 6 weeks.

## 2020-09-21 NOTE — Assessment & Plan Note (Signed)
Admits to poor diet and weight gain.  Update a1c.

## 2020-09-21 NOTE — Assessment & Plan Note (Signed)
Likely due to sedentary lifestyle and weight gain.  Will check D-dimer as she is having some dyspnea but I think this is also related to her weight gain.  Add lasix back on and recommend adding compression stockings.

## 2020-09-21 NOTE — Progress Notes (Signed)
Nealie Mchatton - 55 y.o. female MRN [id number]  Date of birth: 1966-03-20  Subjective Chief Complaint  Patient presents with  . Edema    HPI Wylodean Shimmel is a 55 y.o. female here today with complaint of lower extremity edema.  Having increased swelling of the LLE over the past month or two.  Denies pain with swelling.  Has gained weight and feels like this may be contributing.  Her current job is fairly sedentary and she admits to stress eating over the past several months.  She has used lasix as needed for swelling in the past   She has had some shortness of breath over the past couple of weeks, mainly with exertion.  Thinks this may be related to deconditioning. Denies cough or chest pain.   She is overdue to check of A1c.  She expects this to be elevated due to her recent eating habits.    ROS:  A comprehensive ROS was completed and negative except as noted per HPI  Allergies  Allergen Reactions  . Sulfa Antibiotics Itching, Swelling and Rash  . Bupropion Hcl Itching and Rash  . Codeine Itching  . Sulfamethoxazole-Trimethoprim Rash    Past Medical History:  Diagnosis Date  . Anxiety   . Arthritis    back, knees, right elbow  . Bipolar disorder ([place])   . 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 Cirrhosis ([place])   . Dental crowns present   . Depression   . Diabetes mellitus without complication ([place])   . GERD (gastroesophageal reflux disease)   . High cholesterol    no current med.  Marland Kitchen History of kidney stones   . History of migraine headaches   . Hypertension    under control with meds., has been on med. x 4 yr.  . Hypothyroidism   . NASH (nonalcoholic steatohepatitis)   . PCOS (polycystic ovarian syndrome)   . PONV (postoperative nausea and vomiting)    also hx. of emergence delirium 2007  . Sleep apnea    uses CPAP nightly  . Trigeminal neuralgia     Past Surgical History:  Procedure Laterality Date  . ACHILLES TENDON SURGERY Left 2007  . APPENDECTOMY     . CARPAL TUNNEL RELEASE Right 01/12/2016   Procedure: RIGHT CARPAL TUNNEL RELEASE;  Surgeon: Roseanne Kaufman, MD;  Location: [facility];  Service: Orthopedics;  Laterality: Right;  . COLONOSCOPY WITH PROPOFOL N/A 04/20/2014   Procedure: COLONOSCOPY WITH PROPOFOL;  Surgeon: Arta Silence, MD;  Location: [facility];  Service: Endoscopy;  Laterality: N/A;  . ESOPHAGOGASTRODUODENOSCOPY (EGD) WITH PROPOFOL N/A 04/20/2014   Procedure: ESOPHAGOGASTRODUODENOSCOPY (EGD) WITH PROPOFOL;  Surgeon: Arta Silence, MD;  Location: [facility];  Service: Endoscopy;  Laterality: N/A;  . EXCISION HAGLUND'S DEFORMITY WITH ACHILLES TENDON REPAIR Right 02/25/2013   Procedure: RIGHT ACHILLES DEBRIDEMENT AND RECONSTRUCTION;  HAGLUND'S EXCISION; GASTROC RECESSION AND FLEXOR HALLUCIS LONGUS TRANSFER;  Surgeon: Wylene Simmer, MD;  Location: [facility];  Service: Orthopedics;  Laterality: Right;  . JOINT REPLACEMENT     hip  . LUMBAR LAMINECTOMY     X 3  . neck fusion    . OVARIAN CYST SURGERY Left 1998  . PILONIDAL CYST EXCISION  1994  . RIGHT OOPHORECTOMY    . STERIOD INJECTION Left 01/12/2016   Procedure: STEROID INJECTION LEFT WRIST;  Surgeon: Roseanne Kaufman, MD;  Location: [facility];  Service: Orthopedics;  Laterality: Left;  . TOTAL HIP ARTHROPLASTY Left 03/19/2019  Procedure: LEFT TOTAL HIP ARTHROPLASTY ANTERIOR APPROACH;  Surgeon: Mcarthur Rossetti, MD;  Location: [facility];  Service: Orthopedics;  Laterality: Left;  . TOTAL THYROIDECTOMY  2003  . UPPER GI ENDOSCOPY  01/19/2015    Social History   Socioeconomic History  . Marital status: Married    Spouse name: Not on file  . Number of children: 0  . Years of education: 59  . Highest education level: Not on file  Occupational History  . Occupation: RN- PICU  Tobacco Use  . Smoking status: Former Smoker    Packs/day: 0.50    Years: 15.00    Pack years: 7.50    Types: Cigarettes    Quit date: 02/2019     Years since quitting: 1.6  . Smokeless tobacco: Never Used  Vaping Use  . Vaping Use: Never used  Substance and Sexual Activity  . Alcohol use: No    Alcohol/week: 0.0 standard drinks    Comment: Has not had any alcohol since 07/2014 - before this date, she rarely drink.  . Drug use: No  . Sexual activity: Yes    Partners: Male    Birth control/protection: Condom  Other Topics Concern  . Not on file  Social History Narrative   RN - [employer]   Regular exercise: no   Caffeine use: 2 x daily   Right-handed.   Lives alone.   Social Determinants of Health   Financial Resource Strain: Not on file  Food Insecurity: Not on file  Transportation Needs: Not on file  Physical Activity: Not on file  Stress: Not on file  Social Connections: Not on file    Family History  Problem Relation Age of Onset  . Heart disease Father   . Depression Father   . Heart attack Father   . Cancer Brother        THROAT colon  . Depression Brother   . Anxiety disorder Brother   . Depression Sister   . Breast cancer Mother   . Hypertension Mother   . Depression Maternal Grandmother   . Stroke Maternal Grandfather     Health Maintenance  Topic Date Due  . Hepatitis C Screening  Never done  . FOOT EXAM  Never done  . HIV Screening  Never done  . PAP SMEAR-Modifier  Never done  . MAMMOGRAM  10/28/2017  . INFLUENZA VACCINE  03/05/2020  . OPHTHALMOLOGY EXAM  03/28/2020  . HEMOGLOBIN A1C  09/01/2020  . COLONOSCOPY (Pts 45-61yrs Insurance coverage will need to be confirmed)  04/20/2024  . TETANUS/TDAP  01/03/2028  . PNEUMOCOCCAL POLYSACCHARIDE VACCINE AGE 71-64 HIGH RISK  Completed  . COVID-19 Vaccine  Completed     ----------------------------------------------------------------------------------------------------------------------------------------------------------------------------------------------------------------- Physical Exam BP 119/76 (BP Location: Left Arm, Patient  Position: Sitting, Cuff Size: Large)   Pulse 76   Wt (!) 341 lb 1.3 oz (154.7 kg)   LMP 08/30/2018 (Approximate)   SpO2 94%   BMI 47.75 kg/m   Physical Exam Constitutional:      Appearance: Normal appearance.  HENT:     Head: Normocephalic and atraumatic.  Eyes:     General: No scleral icterus. Cardiovascular:     Rate and Rhythm: Normal rate and regular rhythm.  Pulmonary:     Effort: Pulmonary effort is normal.     Breath sounds: Normal breath sounds.  Musculoskeletal:     Cervical back: Neck supple.     Right lower leg: No edema.     Left lower leg: Edema (  LLE edema.  Negative homan) present.  Neurological:     General: No focal deficit present.     Mental Status: She is alert.  Psychiatric:        Mood and Affect: Mood normal.        Behavior: Behavior normal.     ------------------------------------------------------------------------------------------------------------------------------------------------------------------------------------------------------------------- Assessment and Plan  Diabetes mellitus, type 2 Admits to poor diet and weight gain.  Update a1c.   Leg swelling Likely due to sedentary lifestyle and weight gain.  Will check D-dimer as she is having some dyspnea but I think this is also related to her weight gain.  Add lasix back on and recommend adding compression stockings.    Morbid obesity Discussed trial of weight loss medication and will start phentermine.  Side effects reviewed.  She'll check back in with me in about 6 weeks.    Meds ordered this encounter  Medications  . phentermine 37.5 MG capsule    Sig: Take 1 capsule (37.5 mg total) by mouth every morning.    Dispense:  30 capsule    Refill:  1  . furosemide (LASIX) 20 MG tablet    Sig: Take 1 tablet (20 mg total) by mouth daily as needed for edema.    Dispense:  30 tablet    Refill:  3    Return in about 6 weeks (around 11/02/2020) for Weight mgmt.    This visit  occurred during the SARS-CoV-2 public health emergency.  Safety protocols were in place, including screening questions prior to the visit, additional usage of staff PPE, and extensive cleaning of exam room while observing appropriate contact time as indicated for disinfecting solutions.

## 2020-09-21 NOTE — Patient Instructions (Signed)
Great to see you today! Have labs completed. Try compression stockings.  See me again in about 6 weeks.

## 2020-09-22 ENCOUNTER — Other Ambulatory Visit: Payer: Self-pay | Admitting: Family Medicine

## 2020-09-22 ENCOUNTER — Encounter: Payer: Self-pay | Admitting: Family Medicine

## 2020-09-22 ENCOUNTER — Ambulatory Visit (INDEPENDENT_AMBULATORY_CARE_PROVIDER_SITE_OTHER): Payer: 59

## 2020-09-22 DIAGNOSIS — R0602 Shortness of breath: Secondary | ICD-10-CM | POA: Diagnosis not present

## 2020-09-22 DIAGNOSIS — R071 Chest pain on breathing: Secondary | ICD-10-CM | POA: Diagnosis not present

## 2020-09-22 DIAGNOSIS — R791 Abnormal coagulation profile: Secondary | ICD-10-CM

## 2020-09-22 DIAGNOSIS — M7989 Other specified soft tissue disorders: Secondary | ICD-10-CM

## 2020-09-22 DIAGNOSIS — R0600 Dyspnea, unspecified: Secondary | ICD-10-CM | POA: Diagnosis not present

## 2020-09-22 DIAGNOSIS — R7989 Other specified abnormal findings of blood chemistry: Secondary | ICD-10-CM | POA: Diagnosis not present

## 2020-09-22 LAB — HEMOGLOBIN A1C
Hgb A1c MFr Bld: 8 % of total Hgb — ABNORMAL HIGH (ref <5.7)
Mean Plasma Glucose: 183 mg/dL
eAG (mmol/L): 10.1 mmol/L

## 2020-09-22 LAB — BASIC METABOLIC PANEL
BUN: 13 mg/dL (ref 7–25)
CO2: 27 mmol/L (ref 20–32)
Calcium: 8.9 mg/dL (ref 8.6–10.4)
Chloride: 103 mmol/L (ref 98–110)
Creat: 0.78 mg/dL (ref 0.50–1.05)
Glucose, Bld: 238 mg/dL — ABNORMAL HIGH (ref 65–99)
Potassium: 3.9 mmol/L (ref 3.5–5.3)
Sodium: 136 mmol/L (ref 135–146)

## 2020-09-22 LAB — D-DIMER, QUANTITATIVE: D-Dimer, Quant: 1.28 mcg/mL FEU — ABNORMAL HIGH (ref <0.50)

## 2020-09-22 MED ORDER — IOHEXOL 350 MG/ML SOLN
100.0000 mL | Freq: Once | INTRAVENOUS | Status: AC | PRN
  Administered 2020-09-22: 100 mL via INTRAVENOUS

## 2020-09-22 MED ORDER — FREESTYLE FREEDOM KIT: PACK | 0 refills | Status: DC

## 2020-09-22 MED ORDER — FREESTYLE LITE TEST VI STRP: ORAL_STRIP | 12 refills | Status: DC

## 2020-09-22 MED ORDER — METFORMIN HCL ER 500 MG PO TB24: 1000.0000 mg | ORAL_TABLET | Freq: Every day | ORAL | 1 refills | Status: DC

## 2020-09-25 DIAGNOSIS — F3181 Bipolar II disorder: Secondary | ICD-10-CM | POA: Diagnosis not present

## 2020-09-29 ENCOUNTER — Encounter: Payer: Self-pay | Admitting: Family Medicine

## 2020-10-04 DIAGNOSIS — G4733 Obstructive sleep apnea (adult) (pediatric): Secondary | ICD-10-CM | POA: Diagnosis not present

## 2020-10-04 DIAGNOSIS — F3181 Bipolar II disorder: Secondary | ICD-10-CM | POA: Diagnosis not present

## 2020-10-05 ENCOUNTER — Encounter: Payer: Self-pay | Admitting: Family Medicine

## 2020-10-06 ENCOUNTER — Ambulatory Visit (INDEPENDENT_AMBULATORY_CARE_PROVIDER_SITE_OTHER): Payer: 59

## 2020-10-06 ENCOUNTER — Other Ambulatory Visit: Payer: Self-pay

## 2020-10-06 ENCOUNTER — Ambulatory Visit: Payer: 59 | Admitting: Family Medicine

## 2020-10-06 ENCOUNTER — Encounter: Payer: Self-pay | Admitting: Family Medicine

## 2020-10-06 VITALS — BP 127/83 | HR 88 | Wt 338.0 lb

## 2020-10-06 DIAGNOSIS — M7989 Other specified soft tissue disorders: Secondary | ICD-10-CM

## 2020-10-06 DIAGNOSIS — I10 Essential (primary) hypertension: Secondary | ICD-10-CM | POA: Diagnosis not present

## 2020-10-06 DIAGNOSIS — R2243 Localized swelling, mass and lump, lower limb, bilateral: Secondary | ICD-10-CM

## 2020-10-06 NOTE — Progress Notes (Signed)
MyChart message sent - Good news! No signs of a clot in either of your legs. I'm still waiting for the lab results to come back, but for now, keep doing everything we discussed today to try to get that fluid off. I hope you feel better soon!

## 2020-10-06 NOTE — Patient Instructions (Signed)
Ultrasound here at 3pm today. Labs today Keep with 40 mg lasix for now.  Daily weights, sodium restriction

## 2020-10-06 NOTE — Progress Notes (Signed)
Acute Office Visit  Subjective:    Patient ID: Tracy [name], female    DOB: 02-08-1966, 55 y.o.   MRN: [id number]  No chief complaint on file.   HPI Patient is in today for ongoing leg swelling with discomfort. Patient states she has had episodes of lower extremity edema in the past, but it typically will last for 2-3 days and then resolves spontaneously.  Patient saw Dr. Zigmund Daniel on 09/21/20 for leg swelling that would not resolve on its own. He checked a d-dimer which was elevated and also did a chest CT to rule out PE as she was reporting some mild shortness of breath (negative for PE). He also started her on 20 mg lasix daily and encouraged compression socks, elevation, and sodium restriction. She has been doing everything he told her, but was not noticing any difference. She sent him a MyChart message yesterday and increased her lasix to 40 mg per day - she noticed a little bit better urine output since increasing the dose. She has been weighing daily for the past 2 weeks, but has only lost about 3 pounds.   Today she reports ongoing swelling and discomfort - worse on anterior-lateral left leg (5/10 pain and tightness) and right leg swelling with 2/10 discomfort. 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 No rashes, erythema, lesions, weeping, injury, loss of function/ ROM.  She does report the same occasional shortness of breath and tightness described to Dr. Zigmund Daniel, but she said it seems to be more related to her anxiety/panic disorder.     Past Medical History:  Diagnosis Date  . Anxiety   . Arthritis    back, knees, right elbow  . Bipolar disorder ([place])   . 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 Cirrhosis ([place])   . Dental crowns present   .  Depression   . Diabetes mellitus without complication ([place])   . GERD (gastroesophageal reflux disease)   . High cholesterol    no current med.  [place] Kitchen History of kidney stones   . History of migraine headaches   . Hypertension    under control with meds., has been on med. x 4 yr.  . Hypothyroidism   . NASH (nonalcoholic steatohepatitis)   . PCOS (polycystic ovarian syndrome)   . PONV (postoperative nausea and vomiting)    also hx. of emergence delirium 2007  . Sleep apnea    uses CPAP nightly  . Trigeminal neuralgia     Past Surgical History:  Procedure Laterality Date  . ACHILLES TENDON SURGERY Left 2007  . APPENDECTOMY    . CARPAL TUNNEL RELEASE Right 01/12/2016   Procedure: RIGHT CARPAL TUNNEL RELEASE;  Surgeon: Roseanne Kaufman, MD;  Location: [facility];  Service: Orthopedics;  Laterality: Right;  . COLONOSCOPY WITH PROPOFOL N/A 04/20/2014   Procedure: COLONOSCOPY WITH PROPOFOL;  Surgeon: Arta Silence, MD;  Location: [facility];  Service: Endoscopy;  Laterality: N/A;  . ESOPHAGOGASTRODUODENOSCOPY (EGD) WITH PROPOFOL N/A 04/20/2014   Procedure: ESOPHAGOGASTRODUODENOSCOPY (EGD) WITH PROPOFOL;  Surgeon: Arta Silence, MD;  Location: [facility];  Service: Endoscopy;  Laterality: N/A;  . EXCISION HAGLUND'S DEFORMITY WITH ACHILLES TENDON REPAIR Right 02/25/2013   Procedure: RIGHT ACHILLES DEBRIDEMENT AND RECONSTRUCTION;  HAGLUND'S EXCISION; GASTROC RECESSION AND FLEXOR HALLUCIS LONGUS TRANSFER;  Surgeon: Wylene Simmer, MD;  Location: [facility];  Service: Orthopedics;  Laterality: Right;  . JOINT REPLACEMENT     hip  . LUMBAR LAMINECTOMY     X 3  . neck fusion    . OVARIAN CYST SURGERY Left 1998  . PILONIDAL CYST EXCISION  1994  . RIGHT OOPHORECTOMY    . STERIOD INJECTION Left 01/12/2016   Procedure: STEROID INJECTION LEFT WRIST;  Surgeon: Roseanne Kaufman, MD;  Location: [facility];  Service: Orthopedics;  Laterality: Left;  . TOTAL HIP  ARTHROPLASTY Left 03/19/2019   Procedure: LEFT TOTAL HIP ARTHROPLASTY ANTERIOR APPROACH;  Surgeon: Mcarthur Rossetti, MD;  Location: [facility];  Service: Orthopedics;  Laterality: Left;  . TOTAL THYROIDECTOMY  2003  . UPPER GI ENDOSCOPY  01/19/2015    Family History  Problem Relation Age of Onset  . Heart disease Father   . Depression Father   . Heart attack Father   . Cancer Brother        THROAT colon  . Depression Brother   . Anxiety disorder Brother   . Depression Sister   . Breast cancer Mother   . Hypertension Mother   . Depression Maternal Grandmother   . Stroke Maternal Grandfather     Social History   Socioeconomic History  . Marital status: Married    Spouse name: Not on file  . Number of children: 0  . Years of education: 5  . Highest education level: Not on file  Occupational History  . Occupation: RN- PICU  Tobacco Use  . Smoking status: Former Smoker    Packs/day: 0.50    Years: 15.00    Pack years: 7.50    Types: Cigarettes    Quit date: 02/2019    Years since quitting: 1.6  . Smokeless tobacco: Never Used  Vaping Use  . Vaping Use: Never used  Substance and Sexual Activity  . Alcohol use: No    Alcohol/week: 0.0 standard drinks    Comment: Has not had any alcohol since 07/2014 - before this date, she rarely drink.  . Drug use: No  . Sexual activity: Yes    Partners: Male    Birth control/protection: Condom  Other Topics Concern  . Not on file  Social History Narrative   RN - [employer]   Regular exercise: no   Caffeine use: 2 x daily   Right-handed.   Lives alone.   Social Determinants of Health   Financial Resource Strain: Not on file  Food Insecurity: Not on file  Transportation Needs: Not on file  Physical Activity: Not on file  Stress: Not on file  Social Connections: Not on file  Intimate Partner Violence: Not on file    Outpatient Medications Prior to Visit  Medication Sig Dispense Refill  . ALPRAZOLAM XR 1 MG 24 hr  tablet Take 1 mg by mouth every morning.    . ARIPiprazole (ABILIFY) 10 MG tablet Take 10 mg by mouth at bedtime.    . Blood Glucose Monitoring Suppl (FREESTYLE FREEDOM) KIT Please dispense meter and supplies (strips/lancets) to check glucose daily.  May substitute brand based on insurance preference. 1 kit 0  . Carbamazepine (EQUETRO) 300 MG CP12 TAKE 1 CAPSULE BY MOUTH EVERY MORNING AND 1 CAPSULE AT BEDTIME    . cetirizine (ZYRTEC) 10 MG tablet Take 10 mg by mouth daily.    . clonazePAM (KLONOPIN) 1 MG tablet  Take 1 mg by mouth 3 (three) times daily.   5  . fluticasone (FLONASE) 50 MCG/ACT nasal spray Place 1 spray into both nostrils daily as needed for allergies or rhinitis.    . furosemide (LASIX) 20 MG tablet Take 1 tablet (20 mg total) by mouth daily as needed for edema. 30 tablet 3  . glucose blood (FREESTYLE LITE) test strip Use to check glucose daily. 100 each 12  . lamoTRIgine (LAMICTAL) 200 MG tablet Take 400 mg by mouth every evening.   1  . lisinopril-hydrochlorothiazide (ZESTORETIC) 10-12.5 MG tablet TAKE 1 TABLET BY MOUTH DAILY. 90 tablet 1  . metFORMIN (GLUCOPHAGE-XR) 500 MG 24 hr tablet Take 2 tablets (1,000 mg total) by mouth daily with breakfast. 180 tablet 1  . NUVIGIL 200 MG TABS     . nystatin (MYCOSTATIN/NYSTOP) powder Apply 1 application topically 3 (three) times daily. 60 g 3  . ondansetron (ZOFRAN-ODT) 4 MG disintegrating tablet 4 mg 2 (two) times daily as needed.    . phentermine 37.5 MG capsule Take 1 capsule (37.5 mg total) by mouth every morning. 30 capsule 1  . RABEprazole (ACIPHEX) 20 MG tablet TAKE 1 TABLET BY MOUTH DAILY 90 tablet 1  . rizatriptan (MAXALT) 10 MG tablet Take 1 tablet (10 mg total) by mouth as needed for migraine. May repeat in 2 hours if needed 10 tablet 0  . rosuvastatin (CRESTOR) 20 MG tablet Take 1 tablet (20 mg total) by mouth daily. 90 tablet 3  . sertraline (ZOLOFT) 100 MG tablet Take 200 mg by mouth every morning. Take 2 tablets (232m) at  breakfast    . SYNTHROID 200 MCG tablet Take 1-1.5 tablets (200-300 mcg total) by mouth See admin instructions. Take 1 1/2 tablet (3055m) on Sunday and Monday. Take 1 tablet (20083m all other days 135 tablet 1   No facility-administered medications prior to visit.    Allergies  Allergen Reactions  . Sulfa Antibiotics Itching, Swelling and Rash  . Bupropion Hcl Itching and Rash  . Codeine Itching  . Sulfamethoxazole-Trimethoprim Rash    Review of Systems All review of systems negative except what is listed in the HPI     Objective:    Physical Exam Vitals reviewed.  Constitutional:      Appearance: Normal appearance.  HENT:     Head: Normocephalic and atraumatic.  Cardiovascular:     Rate and Rhythm: Normal rate and regular rhythm.     Pulses: Normal pulses.     Heart sounds: Normal heart sounds.  Pulmonary:     Effort: Pulmonary effort is normal.     Breath sounds: Normal breath sounds. No rales.  Abdominal:     General: There is no distension.     Palpations: Abdomen is soft.  Musculoskeletal:        General: Normal range of motion.     Right lower leg: Edema present.     Left lower leg: Edema present.     Comments: 2-3+ pitting edema to BLE  Skin:    General: Skin is warm and dry.     Capillary Refill: Capillary refill takes less than 2 seconds.     Findings: Rash present. No erythema or lesion.  Neurological:     General: No focal deficit present.     Mental Status: She is oriented to person, place, and time. Mental status is at baseline.  Psychiatric:        Mood and Affect: Mood normal.  Behavior: Behavior normal.        Thought Content: Thought content normal.        Judgment: Judgment normal.     LMP 08/30/2018 (Approximate)  Wt Readings from Last 3 Encounters:  09/21/20 (!) 341 lb 1.3 oz (154.7 kg)  06/21/20 (!) 318 lb 4.8 oz (144.4 kg)  03/01/20 (!) 302 lb (137 kg)    Health Maintenance Due  Topic Date Due  . Hepatitis C Screening   Never done  . FOOT EXAM  Never done  . HIV Screening  Never done  . PAP SMEAR-Modifier  Never done  . MAMMOGRAM  10/28/2017  . INFLUENZA VACCINE  03/05/2020  . OPHTHALMOLOGY EXAM  03/28/2020    There are no preventive care reminders to display for this patient.   Lab Results  Component Value Date   TSH 2.62 03/01/2020   Lab Results  Component Value Date   WBC 6.5 03/01/2020   HGB 12.7 03/01/2020   HCT 41.0 03/01/2020   MCV 77.8 (L) 03/01/2020   PLT 285 03/01/2020   Lab Results  Component Value Date   NA 136 09/21/2020   K 3.9 09/21/2020   CO2 27 09/21/2020   GLUCOSE 238 (H) 09/21/2020   BUN 13 09/21/2020   CREATININE 0.78 09/21/2020   BILITOT 0.4 03/01/2020   ALKPHOS 71 09/08/2019   AST 22 03/01/2020   ALT 23 03/01/2020   PROT 7.1 03/01/2020   ALBUMIN 3.4 (L) 09/08/2019   CALCIUM 8.9 09/21/2020   ANIONGAP 7 09/08/2019   GFR 78.30 03/25/2018   Lab Results  Component Value Date   CHOL 240 (H) 03/01/2020   Lab Results  Component Value Date   HDL 40 (L) 03/01/2020   Lab Results  Component Value Date   LDLCALC 169 (H) 03/01/2020   Lab Results  Component Value Date   TRIG 166 (H) 03/01/2020   Lab Results  Component Value Date   CHOLHDL 6.0 (H) 03/01/2020   Lab Results  Component Value Date   HGBA1C 8.0 (H) 09/21/2020       Assessment & Plan:   1. Leg swelling 2. Essential hypertension  Patient with over 2 weeks of BLE edema and pain not responding to low dose lasix and supportive measures. Patient is anxious about possibility of clot given her elevated d-dimer at last visit. Will go ahead and order BLE [place] to rule out DVT. Also would like to check BMP for kidney function after being on lasix for the past 2 weeks - if normal, recommend she continue with the 40 mg daily for the next week to see if we can get more fluid off. Would also like to check a BNP for cardiac causes of edema. Venous insufficiency remains a differential as well. Continue with  sodium restriction, elevation, compression socks, and daily weights.   - Brain natriuretic peptide - BASIC METABOLIC PANEL WITH GFR - US Venous Img Lower Bilateral (DVT); Future   Follow-up in the next 1-2 weeks pending test results or sooner if symptoms worsen or fail to improve.     Terrilyn Saver, NP

## 2020-10-07 LAB — BASIC METABOLIC PANEL WITH GFR
BUN: 12 mg/dL (ref 7–25)
CO2: 26 mmol/L (ref 20–32)
Calcium: 9.2 mg/dL (ref 8.6–10.4)
Chloride: 100 mmol/L (ref 98–110)
Creat: 0.79 mg/dL (ref 0.50–1.05)
GFR, Est African American: 100 mL/min/{1.73_m2} (ref >=60)
GFR, Est Non African American: 86 mL/min/{1.73_m2} (ref >=60)
Glucose, Bld: 132 mg/dL (ref 65–139)
Potassium: 3.7 mmol/L (ref 3.5–5.3)
Sodium: 136 mmol/L (ref 135–146)

## 2020-10-07 LAB — BRAIN NATRIURETIC PEPTIDE: Brain Natriuretic Peptide: 5 pg/mL (ref <100)

## 2020-10-07 NOTE — Progress Notes (Signed)
MyChart message sent - Your labs look good! Keep up with the 40 mg of lasix, daily weights, compression socks, sodium restriction, etc. Schedule a follow-up for next week so we can check your swelling.

## 2020-10-10 ENCOUNTER — Other Ambulatory Visit: Payer: Self-pay | Admitting: Family Medicine

## 2020-10-10 ENCOUNTER — Other Ambulatory Visit: Payer: Self-pay

## 2020-10-10 MED ORDER — FUROSEMIDE 20 MG PO TABS: 20.0000 mg | ORAL_TABLET | Freq: Every day | ORAL | 3 refills | Status: DC | PRN

## 2020-10-11 ENCOUNTER — Encounter: Payer: Self-pay | Admitting: Family Medicine

## 2020-10-11 ENCOUNTER — Other Ambulatory Visit: Payer: Self-pay

## 2020-10-11 ENCOUNTER — Ambulatory Visit (INDEPENDENT_AMBULATORY_CARE_PROVIDER_SITE_OTHER): Payer: 59 | Admitting: Family Medicine

## 2020-10-11 ENCOUNTER — Encounter (INDEPENDENT_AMBULATORY_CARE_PROVIDER_SITE_OTHER): Payer: Self-pay | Admitting: Family Medicine

## 2020-10-11 ENCOUNTER — Other Ambulatory Visit: Payer: Self-pay | Admitting: Family Medicine

## 2020-10-11 VITALS — BP 111/72 | HR 78 | Temp 97.9°F | Ht 70.0 in | Wt 336.0 lb

## 2020-10-11 DIAGNOSIS — E1169 Type 2 diabetes mellitus with other specified complication: Secondary | ICD-10-CM

## 2020-10-11 DIAGNOSIS — E782 Mixed hyperlipidemia: Secondary | ICD-10-CM

## 2020-10-11 DIAGNOSIS — E890 Postprocedural hypothyroidism: Secondary | ICD-10-CM

## 2020-10-11 DIAGNOSIS — Z1331 Encounter for screening for depression: Secondary | ICD-10-CM | POA: Diagnosis not present

## 2020-10-11 DIAGNOSIS — E559 Vitamin D deficiency, unspecified: Secondary | ICD-10-CM

## 2020-10-11 DIAGNOSIS — R5383 Other fatigue: Secondary | ICD-10-CM

## 2020-10-11 DIAGNOSIS — Z9189 Other specified personal risk factors, not elsewhere classified: Secondary | ICD-10-CM

## 2020-10-11 DIAGNOSIS — R0602 Shortness of breath: Secondary | ICD-10-CM | POA: Diagnosis not present

## 2020-10-11 DIAGNOSIS — I10 Essential (primary) hypertension: Secondary | ICD-10-CM

## 2020-10-11 DIAGNOSIS — Z0289 Encounter for other administrative examinations: Secondary | ICD-10-CM

## 2020-10-11 DIAGNOSIS — Z6841 Body Mass Index (BMI) 40.0 and over, adult: Secondary | ICD-10-CM

## 2020-10-11 DIAGNOSIS — F3132 Bipolar disorder, current episode depressed, moderate: Secondary | ICD-10-CM

## 2020-10-11 MED ORDER — FUROSEMIDE 40 MG PO TABS: 40.0000 mg | ORAL_TABLET | Freq: Every day | ORAL | 3 refills | Status: DC
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
## 2020-10-12 ENCOUNTER — Encounter: Payer: Self-pay | Admitting: Family Medicine

## 2020-10-12 ENCOUNTER — Emergency Department
Admission: RE | Admit: 2020-10-12 | Discharge: 2020-10-12 | Disposition: A | Discharge status: Home / Self Care | Payer: 59 | Source: Ambulatory Visit

## 2020-10-12 ENCOUNTER — Emergency Department (INDEPENDENT_AMBULATORY_CARE_PROVIDER_SITE_OTHER): Payer: 59

## 2020-10-12 ENCOUNTER — Other Ambulatory Visit: Payer: Self-pay

## 2020-10-12 ENCOUNTER — Other Ambulatory Visit: Payer: Self-pay | Admitting: Family Medicine

## 2020-10-12 ENCOUNTER — Ambulatory Visit: Payer: 59 | Admitting: Family Medicine

## 2020-10-12 VITALS — BP 117/77 | HR 78 | Temp 98.7°F | Resp 15

## 2020-10-12 DIAGNOSIS — M79672 Pain in left foot: Secondary | ICD-10-CM

## 2020-10-12 DIAGNOSIS — M25572 Pain in left ankle and joints of left foot: Secondary | ICD-10-CM

## 2020-10-12 DIAGNOSIS — M62831 Muscle spasm of calf: Secondary | ICD-10-CM

## 2020-10-12 DIAGNOSIS — S86012A Strain of left Achilles tendon, initial encounter: Secondary | ICD-10-CM | POA: Diagnosis not present

## 2020-10-12 DIAGNOSIS — M25472 Effusion, left ankle: Secondary | ICD-10-CM

## 2020-10-12 DIAGNOSIS — M7989 Other specified soft tissue disorders: Secondary | ICD-10-CM | POA: Diagnosis not present

## 2020-10-12 LAB — CBC WITH DIFFERENTIAL/PLATELET
Basophils Absolute: 0 10*3/uL (ref 0.0–0.2)
Basos: 1 %
EOS (ABSOLUTE): 0.2 10*3/uL (ref 0.0–0.4)
Eos: 4 %
Hematocrit: 35.6 % (ref 34.0–46.6)
Hemoglobin: 10.9 g/dL — ABNORMAL LOW (ref 11.1–15.9)
Immature Grans (Abs): 0 10*3/uL (ref 0.0–0.1)
Immature Granulocytes: 0 %
Lymphocytes Absolute: 1.7 10*3/uL (ref 0.7–3.1)
Lymphs: 35 %
MCH: 24 pg — ABNORMAL LOW (ref 26.6–33.0)
MCHC: 30.6 g/dL — ABNORMAL LOW (ref 31.5–35.7)
MCV: 78 fL — ABNORMAL LOW (ref 79–97)
Monocytes Absolute: 0.4 10*3/uL (ref 0.1–0.9)
Monocytes: 8 %
Neutrophils Absolute: 2.6 10*3/uL (ref 1.4–7.0)
Neutrophils: 52 %
Platelets: 224 10*3/uL (ref 150–450)
RBC: 4.55 x10E6/uL (ref 3.77–5.28)
RDW: 17.5 % — ABNORMAL HIGH (ref 11.7–15.4)
WBC: 4.8 10*3/uL (ref 3.4–10.8)

## 2020-10-12 LAB — TSH: TSH: 10.6 u[IU]/mL — ABNORMAL HIGH (ref 0.450–4.500)

## 2020-10-12 LAB — COMPREHENSIVE METABOLIC PANEL
ALT: 31 IU/L (ref 0–32)
AST: 32 IU/L (ref 0–40)
Albumin/Globulin Ratio: 1.4 (ref 1.2–2.2)
Albumin: 4.2 g/dL (ref 3.8–4.9)
Alkaline Phosphatase: 126 IU/L — ABNORMAL HIGH (ref 44–121)
BUN/Creatinine Ratio: 18 (ref 9–23)
BUN: 14 mg/dL (ref 6–24)
Bilirubin Total: <0.2 mg/dL (ref 0.0–1.2)
CO2: 23 mmol/L (ref 20–29)
Calcium: 9.2 mg/dL (ref 8.7–10.2)
Chloride: 102 mmol/L (ref 96–106)
Creatinine, Ser: 0.79 mg/dL (ref 0.57–1.00)
Globulin, Total: 2.9 g/dL (ref 1.5–4.5)
Glucose: 166 mg/dL — ABNORMAL HIGH (ref 65–99)
Potassium: 4.2 mmol/L (ref 3.5–5.2)
Sodium: 140 mmol/L (ref 134–144)
Total Protein: 7.1 g/dL (ref 6.0–8.5)
eGFR: 90 mL/min/{1.73_m2} (ref >59)

## 2020-10-12 LAB — T4: T4, Total: 7.3 ug/dL (ref 4.5–12.0)

## 2020-10-12 LAB — VITAMIN B12: Vitamin B-12: 479 pg/mL (ref 232–1245)

## 2020-10-12 LAB — LIPID PANEL WITH LDL/HDL RATIO
Cholesterol, Total: 155 mg/dL (ref 100–199)
HDL: 44 mg/dL (ref >39)
LDL Chol Calc (NIH): 85 mg/dL (ref 0–99)
LDL/HDL Ratio: 1.9 ratio (ref 0.0–3.2)
Triglycerides: 152 mg/dL — ABNORMAL HIGH (ref 0–149)
VLDL Cholesterol Cal: 26 mg/dL (ref 5–40)

## 2020-10-12 LAB — INSULIN, RANDOM: INSULIN: 77.2 u[IU]/mL — ABNORMAL HIGH (ref 2.6–24.9)

## 2020-10-12 LAB — T3: T3, Total: 94 ng/dL (ref 71–180)

## 2020-10-12 LAB — VITAMIN D 25 HYDROXY (VIT D DEFICIENCY, FRACTURES): Vit D, 25-Hydroxy: 14 ng/mL — ABNORMAL LOW (ref 30.0–100.0)

## 2020-10-12 LAB — FOLATE: Folate: 3.9 ng/mL (ref >3.0)

## 2020-10-12 MED ORDER — CYCLOBENZAPRINE HCL 10 MG PO TABS
10.0000 mg | ORAL_TABLET | Freq: Two times a day (BID) | ORAL | 0 refills | Status: DC | PRN
Start: 2020-10-12 — End: 2020-10-12

## 2020-10-12 MED ORDER — LEVOTHYROXINE SODIUM 300 MCG PO TABS: 300.0000 ug | ORAL_TABLET | Freq: Every day | ORAL | 1 refills | Status: DC

## 2020-10-12 MED ORDER — KETOROLAC TROMETHAMINE 10 MG PO TABS: 10.0000 mg | ORAL_TABLET | Freq: Four times a day (QID) | ORAL | 0 refills | Status: DC | PRN

## 2020-10-12 MED ORDER — KETOROLAC TROMETHAMINE 30 MG/ML IJ SOLN
30.0000 mg | Freq: Once | INTRAMUSCULAR | Status: AC
  Administered 2020-10-12: 30 mg via INTRAMUSCULAR

## 2020-10-12 NOTE — Discharge Instructions (Addendum)
Xray today shows prior trauma with remodeling but no acute changes.   You have received a toradol injection in the office today  We have placed you in a CAM walker boot today as well  I have sent in toradol for you to take one tablet every 6 hours as needed for pain. Do not take ibuprofen with this medication. You may take tylenol with this.  I have sent in flexeril for you to take twice a day as needed for muscle spasms. This medication can make you sleepy. Do not drive or operate heavy machinery with this medication.  Follow up with orthopedics if symptoms are persist

## 2020-10-12 NOTE — Patient Instructions (Signed)
Increase levothyroxine to 335mcg daily.  Continue current medications.  See me again in about 2 weeks.

## 2020-10-12 NOTE — ED Triage Notes (Signed)
Patient presents to Urgent Care with complaints of sharp and sudden left heel pain since yesterfday. Patient reports she thinks her achilles tendon is injured again (rupture repair approx 2006). Has had left lower leg swelling for a few weeks, has been taking 40mg  lasix and thinks maybe it is because the swelling is due to a tendon rupture.

## 2020-10-12 NOTE — ED Provider Notes (Signed)
[place]   [id number] 10/12/20 Arrival Time: 0827  [id number] PAIN  SUBJECTIVE: History from: patient. Tracy [name] is a 55 y.o. female complains of left ankle and foot pain that began yesterday. Reports left calf muscle spasms as well. Denies a precipitating event or specific injury. Reports that she has a previous achilles tendon rupture on this leg. Reports increased LLE swelling over the last 2-3 weeks as well. She is taking Lasix 85m daily for this with some relief. Localizes the pain to the posterior L ankle. Describes the pain as constant and achy in character with intermittent very sharp pain. Has tried OTC medications without relief. Symptoms are made worse with activity. Denies fever, chills, erythema, ecchymosis, effusion, weakness, numbness and tingling, saddle paresthesias, loss of bowel or bladder function.      ROS: As per HPI.  All other pertinent ROS negative.     Past Medical History:  Diagnosis Date  . Anxiety   . Arthritis    back, knees, right elbow  . Back pain   . Bilateral swelling of feet and ankles   . Bipolar disorder ([place]   . Borderline personality disorder ([place]   . Cancer (HCC)    oral  . Chewing difficulty   . Cirrhosis ([place]   . Constipation   . Dental crowns present   . Depression   . Diabetes mellitus without complication ([place]   . Fatty liver   . Food allergy    Walnuts  . GAD (generalized anxiety disorder)   . GERD (gastroesophageal reflux disease)   . High cholesterol    no current med.  .Marland KitchenHistory of kidney stones   . History of migraine headaches   . Hypertension    under control with meds., has been on med. x 4 yr.  . Hypothyroidism   . IBS (irritable bowel syndrome)   . Infertility, female   . Joint pain   . Lichen planus   . Liver cirrhosis secondary to NASH (nonalcoholic steatohepatitis) ([place]   . NASH (nonalcoholic steatohepatitis)   . Osteoarthritis   . Other fatigue   . Palpitations   . PCOS  (polycystic ovarian syndrome)   . PONV (postoperative nausea and vomiting)    also hx. of emergence delirium 2007  . Shortness of breath on exertion   . Sleep apnea    uses CPAP nightly  . Trigeminal neuralgia   . Type 2 diabetes mellitus ([place]   . Vitamin D deficiency    Past Surgical History:  Procedure Laterality Date  . ACHILLES TENDON SURGERY Left 2007  . APPENDECTOMY    . CARPAL TUNNEL RELEASE Right 01/12/2016   Procedure: RIGHT CARPAL TUNNEL RELEASE;  Surgeon: WRoseanne Kaufman MD;  [facility]  Service: Orthopedics;  Laterality: Right;  . COLONOSCOPY WITH PROPOFOL N/A 04/20/2014   Procedure: COLONOSCOPY WITH PROPOFOL;  Surgeon: WArta Silence MD;  [facility];  Service: Endoscopy;  Laterality: N/A;  . ESOPHAGOGASTRODUODENOSCOPY (EGD) WITH PROPOFOL N/A 04/20/2014   Procedure: ESOPHAGOGASTRODUODENOSCOPY (EGD) WITH PROPOFOL;  Surgeon: WArta Silence MD;  [facility];  Service: Endoscopy;  Laterality: N/A;  . EXCISION HAGLUND'S DEFORMITY WITH ACHILLES TENDON REPAIR Right 02/25/2013   Procedure: RIGHT ACHILLES DEBRIDEMENT AND RECONSTRUCTION;  HAGLUND'S EXCISION; GASTROC RECESSION AND FLEXOR HALLUCIS LONGUS TRANSFER;  Surgeon: JWylene Simmer MD;  [facility]  Service: Orthopedics;  Laterality: Right;  . JOINT REPLACEMENT     hip  . LUMBAR LAMINECTOMY  X 3  . neck fusion    . OVARIAN CYST SURGERY Left 1998  . PILONIDAL CYST EXCISION  1994  . RIGHT OOPHORECTOMY    . STERIOD INJECTION Left 01/12/2016   Procedure: STEROID INJECTION LEFT WRIST;  Surgeon: Roseanne Kaufman, MD;  Location: [facility];  Service: Orthopedics;  Laterality: Left;  . TOTAL HIP ARTHROPLASTY Left 03/19/2019   Procedure: LEFT TOTAL HIP ARTHROPLASTY ANTERIOR APPROACH;  Surgeon: Mcarthur Rossetti, MD;  [facility];  Service: Orthopedics;  Laterality: Left;  . TOTAL THYROIDECTOMY  2003  . UPPER GI ENDOSCOPY  01/19/2015    Allergies  Allergen Reactions  . Sulfa Antibiotics Itching, Swelling and Rash  . Bupropion Hcl Itching and Rash  . Codeine Itching  . Sulfamethoxazole-Trimethoprim Rash   No current facility-administered medications on file prior to encounter.   Current Outpatient Medications on File Prior to Encounter  Medication Sig Dispense Refill  . furosemide (LASIX) 40 MG tablet Take 1 tablet (40 mg total) by mouth daily. 30 tablet 3  . ALPRAZOLAM XR 1 MG 24 hr tablet Take 0.5 mg by mouth in the morning and at bedtime.    . ARIPiprazole (ABILIFY) 10 MG tablet Take 10 mg by mouth at bedtime.    . Blood Glucose Monitoring Suppl (FREESTYLE FREEDOM) KIT Please dispense meter and supplies (strips/lancets) to check glucose daily.  May substitute brand based on insurance preference. 1 kit 0  . Carbamazepine (EQUETRO) 300 MG CP12 Take 1 capsule by mouth in the morning and at bedtime. Take 1 capsule by mouth in the morning and 2 at bedtime.    . clonazePAM (KLONOPIN) 1 MG tablet Take 1 mg by mouth 3 (three) times daily.   5  . glucose blood (FREESTYLE LITE) test strip Use to check glucose daily. 100 each 12  . lamoTRIgine (LAMICTAL) 200 MG tablet Take 400 mg by mouth every evening.   1  . levothyroxine (SYNTHROID) 200 MCG tablet Take 200 mcg by mouth daily before breakfast. Take 1 tablet by mouth Monday- Friday    . levothyroxine (SYNTHROID) 300 MCG tablet Take 300 mcg by mouth daily before breakfast. Take 1 tablet by mouth daily on Saturday and Sunday ONLY.    Marland Kitchen lisinopril-hydrochlorothiazide (ZESTORETIC) 10-12.5 MG tablet TAKE 1 TABLET BY MOUTH DAILY. 90 tablet 1  . metFORMIN (GLUCOPHAGE-XR) 500 MG 24 hr tablet Take 2 tablets (1,000 mg total) by mouth daily with breakfast. 180 tablet 1  . NUVIGIL 200 MG TABS     . nystatin (MYCOSTATIN/NYSTOP) powder Apply 1 application topically 3 (three) times daily. 60 g 3  . ondansetron (ZOFRAN-ODT) 4 MG disintegrating tablet 4 mg 2 (two) times daily as needed.    .  RABEprazole (ACIPHEX) 20 MG tablet TAKE 1 TABLET BY MOUTH DAILY 90 tablet 1  . rizatriptan (MAXALT) 10 MG tablet Take 1 tablet (10 mg total) by mouth as needed for migraine. May repeat in 2 hours if needed 10 tablet 0  . rosuvastatin (CRESTOR) 20 MG tablet Take 1 tablet (20 mg total) by mouth daily. 90 tablet 3  . sertraline (ZOLOFT) 100 MG tablet Take 200 mg by mouth every morning. Take 2 tablets (247m) at breakfast     Social History   Socioeconomic History  . Marital status: Legally Separated    Spouse name: Not on file  . Number of children: 0  . Years of education: 128 . Highest education level: Not on file  Occupational History  . 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 Smoking status: Former Smoker    Packs/day: 0.50    Years: 15.00    Pack years: 7.50    Types: Cigarettes    Quit date: 02/2019    Years since quitting: 1.6  . Smokeless tobacco: Never Used  Vaping Use  . Vaping Use: Never used  Substance and Sexual Activity  . Alcohol use: No    Alcohol/week: 0.0 standard drinks    Comment: Has not had any alcohol since 07/2014 - before this date, she rarely drink.  . Drug use: No  . Sexual activity: Not Currently    Partners: Male    Birth control/protection: Condom  Other Topics Concern  . Not on file  Social History Narrative   RN - [employer]   Regular exercise: no   Caffeine use: 2 x daily   Right-handed.   Lives alone.   Social Determinants of Health   Financial Resource Strain: Not on file  Food Insecurity: Not on file  Transportation Needs: Not on file  Physical Activity: Not on file  Stress: Not on file  Social Connections: Not on file  Intimate Partner Violence: Not on file   Family History  Problem Relation Age of Onset  . Heart disease Father   . Depression Father   . Heart attack Father   . Sudden death Father   . Anxiety disorder Father   . Bipolar  disorder Father   . Obesity Father   . Cancer Brother        THROAT colon  . Depression Brother   . Anxiety disorder Brother   . Depression Sister   . Breast cancer Mother   . Hypertension Mother   . Cancer Mother   . Depression Mother   . Sleep apnea Mother   . Obesity Mother   . Depression Maternal Grandmother   . Stroke Maternal Grandfather     OBJECTIVE:  Vitals:   10/12/20 0841  BP: 117/77  Pulse: 78  Resp: 15  Temp: 98.7 F (37.1 C)  TempSrc: Oral  SpO2: 99%    General appearance: ALERT; in no acute distress.  Head: NCAT Lungs: Normal respiratory effort CV: pulses 2+ bilaterally. Cap refill < 2 seconds Musculoskeletal:  Inspection: Skin warm, dry, clear and intact No erythema, effusion noted Palpation: posterior L ankle tender to palpation ROM: Limited ROM active and passive to L ankle, holding the foot in flexion Skin: warm and dry Neurologic: Ambulates without difficulty; Sensation intact about the upper/ lower extremities Psychological: alert and cooperative; normal mood and affect  DIAGNOSTIC STUDIES:  DG Ankle Complete Left  Result Date: 10/12/2020 CLINICAL DATA:  Pain and swelling with limited range of motion EXAM: LEFT ANKLE COMPLETE - 3+ VIEW COMPARISON:  None. FINDINGS: Frontal, oblique, and lateral views were obtained. There is mild soft tissue swelling. Calcification in the medial malleolar region is likely due to previous trauma with remodeling in this region. No acute fracture or joint effusion. There is mild joint space narrowing laterally. There are posterior and inferior calcaneal spurs. There are multiple calcifications posterior to the Achilles tendon which may have remote posttraumatic etiology. Postoperative change noted in the calcaneus. IMPRESSION: Findings felt to be indicative of prior trauma with remodeling in the medial malleolar region as well as posterior to the distal Achilles tendon. No acute fracture or joint effusion. Mild  osteoarthritic change. There are calcaneal  spurs. Postoperative change in the calcaneus. Electronically Signed   By: Lowella Grip III M.D.   On: 10/12/2020 09:37     ASSESSMENT & PLAN:  1. Left foot pain   2. Acute left ankle pain   3. Muscle spasm of left calf     Meds ordered this encounter  Medications  . ketorolac (TORADOL) 30 MG/ML injection 30 mg  . ketorolac (TORADOL) 10 MG tablet    Sig: Take 1 tablet (10 mg total) by mouth every 6 (six) hours as needed.    Dispense:  20 tablet    Refill:  0    Order Specific Question:   Supervising Provider    Answer:   Chase Picket [id number]  . cyclobenzaprine (FLEXERIL) 10 MG tablet    Sig: Take 1 tablet (10 mg total) by mouth 2 (two) times daily as needed for muscle spasms.    Dispense:  20 tablet    Refill:  0    Order Specific Question:   Supervising Provider    Answer:   Chase Picket [[id number]]    Toradol 58m IM in office today Prescribed toradol po q6h for pain Prescribed flexeril for spasms Xray today shows prior trauma with remodeling, no acute changes Placed in CAM walker for stability when ambulating Continue conservative management of rest, ice, and gentle stretches Take tylenol as needed for pain relief Take cyclobenzaprine at nighttime for symptomatic relief. Avoid driving or operating heavy machinery while using medication. Follow up with orthopedics if symptoms persist Return or go to the ER if you have any new or worsening symptoms (fever, chills, chest pain, abdominal pain, changes in bowel or bladder habits, pain radiating into lower legs)   Reviewed expectations re: course of current medical issues. Questions answered. Outlined signs and symptoms indicating need for more acute intervention. Patient verbalized understanding. After Visit Summary given.       MFaustino Congress NP 10/12/20 1035

## 2020-10-12 NOTE — Progress Notes (Signed)
Tracy [name] - 55 y.o. female MRN [id number]  Date of birth: May 12, 1966  Subjective Chief Complaint  Patient presents with  . Follow-up    HPI Tracy [name] is a 55 y.o. female here today for follow up of bilateral leg edema.  She continues to have swelling of bilateral extremities L>R.  She has been taking lasix 40mg  bid without much improvement.  She did have venous doppler last week as well which was negative.  She had visit with healthy weight [facility] wellness earlier this week and labs were drawn showing that TSH is elevated.  T4 levels are normal.  Currently on 279mcg of levothyroxine M-F and 336mcg on Sat-Sun.  She has had some weight gain as well.    To further complicate this she did injure her ankle as well.  She thinks that she may have torn her achilles again as this feels similar.  Seen at urgent care this morning and placed in cam walker.  She has f/u with orthopedics.    ROS:  A comprehensive ROS was completed and negative except as noted per HPI  Allergies  Allergen Reactions  . Sulfa Antibiotics Itching, Swelling and Rash  . Bupropion Hcl Itching and Rash  . Codeine Itching  . Sulfamethoxazole-Trimethoprim Rash    Past Medical History:  Diagnosis Date  . Anxiety   . Arthritis    back, knees, right elbow  . Back pain   . Bilateral swelling of feet and ankles   . Bipolar disorder ([place])   . Borderline personality disorder ([place])   . Cancer (HCC)    oral  . Chewing difficulty   . Cirrhosis ([place])   . Constipation   . Dental crowns present   . Depression   . Diabetes mellitus without complication ([place])   . Fatty liver   . Food allergy    Walnuts  . GAD (generalized anxiety disorder)   . GERD (gastroesophageal reflux disease)   . High cholesterol    no current med.  Marland Kitchen History of kidney stones   . History of migraine headaches   . Hypertension    under control with meds., has been on med. x 4 yr.  . Hypothyroidism   . IBS (irritable  bowel syndrome)   . Infertility, female   . Joint pain   . Lichen planus   . Liver cirrhosis secondary to NASH (nonalcoholic steatohepatitis) ([name])   . NASH (nonalcoholic steatohepatitis)   . Osteoarthritis   . Other fatigue   . Palpitations   . PCOS (polycystic ovarian syndrome)   . PONV (postoperative nausea and vomiting)    also hx. of emergence delirium 2007  . Shortness of breath on exertion   . Sleep apnea    uses CPAP nightly  . Trigeminal neuralgia   . Type 2 diabetes mellitus ([place])   . Vitamin D deficiency     Past Surgical History:  Procedure Laterality Date  . ACHILLES TENDON SURGERY Left 2007  . APPENDECTOMY    . CARPAL TUNNEL RELEASE Right 01/12/2016   Procedure: RIGHT CARPAL TUNNEL RELEASE;  Surgeon: Roseanne Kaufman, MD;  Location: [facility];  Service: Orthopedics;  Laterality: Right;  . COLONOSCOPY WITH PROPOFOL N/A 04/20/2014   Procedure: COLONOSCOPY WITH PROPOFOL;  Surgeon: Arta Silence, MD;  [facility];  Service: Endoscopy;  Laterality: N/A;  . ESOPHAGOGASTRODUODENOSCOPY (EGD) WITH PROPOFOL N/A 04/20/2014   Procedure: ESOPHAGOGASTRODUODENOSCOPY (EGD) WITH PROPOFOL;  Surgeon: Arta Silence, MD;  [facility];  Service:  Endoscopy;  Laterality: N/A;  . EXCISION HAGLUND'S DEFORMITY WITH ACHILLES TENDON REPAIR Right 02/25/2013   Procedure: RIGHT ACHILLES DEBRIDEMENT AND RECONSTRUCTION;  HAGLUND'S EXCISION; GASTROC RECESSION AND FLEXOR HALLUCIS LONGUS TRANSFER;  Surgeon: Wylene Simmer, MD;  Location: [facility];  Service: Orthopedics;  Laterality: Right;  . JOINT REPLACEMENT     hip  . LUMBAR LAMINECTOMY     X 3  . neck fusion    . OVARIAN CYST SURGERY Left 1998  . PILONIDAL CYST EXCISION  1994  . RIGHT OOPHORECTOMY    . STERIOD INJECTION Left 01/12/2016   Procedure: STEROID INJECTION LEFT WRIST;  Surgeon: Roseanne Kaufman, MD;  Location: [facility];  Service: Orthopedics;  Laterality: Left;  . TOTAL HIP  ARTHROPLASTY Left 03/19/2019   Procedure: LEFT TOTAL HIP ARTHROPLASTY ANTERIOR APPROACH;  Surgeon: Mcarthur Rossetti, MD;  [facility];  Service: Orthopedics;  Laterality: Left;  . TOTAL THYROIDECTOMY  2003  . UPPER GI ENDOSCOPY  01/19/2015    Social History   Socioeconomic History  . Marital status: Legally Separated    Spouse name: Not on file  . Number of children: 0  . Years of education: 26  . Highest education level: Not on file  Occupational History  . 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 Smoking status: Former Smoker    Packs/day: 0.50    Years: 15.00    Pack years: 7.50    Types: Cigarettes    Quit date: 02/2019    Years since quitting: 1.6  . Smokeless tobacco: Never Used  Vaping Use  . Vaping Use: Never used  Substance and Sexual Activity  . Alcohol use: No    Alcohol/week: 0.0 standard drinks    Comment: Has not had any alcohol since 07/2014 - before this date, she rarely drink.  . Drug use: No  . Sexual activity: Not Currently    Partners: Male    Birth control/protection: Condom  Other Topics Concern  . 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   Lives alone.   Social Determinants of Health   Financial Resource Strain: Not on file  Food Insecurity: Not on file  Transportation Needs: Not on file  Physical Activity: Not on file  Stress: Not on file  Social Connections: Not on file    Family History  Problem Relation Age of Onset  . Heart disease Father   . Depression Father   . Heart attack Father   . Sudden death Father   . Anxiety disorder Father   . Bipolar disorder Father   . Obesity Father   . Cancer Brother        THROAT colon  . Depression Brother   . Anxiety disorder Brother   . Depression Sister   . Breast cancer Mother   . Hypertension Mother   .  Cancer Mother   . Depression Mother   . Sleep apnea Mother   . Obesity Mother   . Depression Maternal Grandmother   . Stroke Maternal Grandfather     Health Maintenance  Topic Date Due  . Hepatitis C Screening  Never done  . FOOT EXAM  Never done  . HIV Screening  Never done  . PAP SMEAR-Modifier  Never done  . MAMMOGRAM  10/28/2017  . INFLUENZA VACCINE  03/05/2020  . OPHTHALMOLOGY EXAM  03/28/2020  . HEMOGLOBIN A1C  03/21/2021  . COLONOSCOPY (Pts 45-63yrs Insurance coverage will need to be confirmed)  04/20/2024  . TETANUS/TDAP  01/03/2028  . PNEUMOCOCCAL POLYSACCHARIDE VACCINE AGE 35-64 HIGH RISK  Completed  . COVID-19 Vaccine  Completed  . HPV VACCINES  Aged Out     ----------------------------------------------------------------------------------------------------------------------------------------------------------------------------------------------------------------- Physical Exam BP 103/67 (BP Location: Left Arm, Patient Position: Sitting, Cuff Size: Large)   Pulse 76   SpO2 98%   Physical Exam Constitutional:      Appearance: Normal appearance.  HENT:     Head: Normocephalic and atraumatic.  Musculoskeletal:     Cervical back: Neck supple.     Right lower leg: Edema present.     Left lower leg: Edema present.  Neurological:     General: No focal deficit present.     Mental Status: She is alert.     ------------------------------------------------------------------------------------------------------------------------------------------------------------------------------------------------------------------- Assessment and Plan  Leg swelling Continues to have leg edema.  I think this is multifactorial with venous insufficiency and hypothyroidism contributing.  Recommend compression stockings, continuation of lasix at current strength and increase in levothyroxine to 32mcg daily.  We'll need to recheck levels in 6-8 weeks.     Meds ordered this encounter   Medications  . levothyroxine (SYNTHROID) 300 MCG tablet    Sig: Take 1 tablet (300 mcg total) by mouth daily before breakfast.    Dispense:  90 tablet    Refill:  1    Return in about 2 weeks (around 10/26/2020) for Leg swelling. .    This visit occurred during the SARS-CoV-2 public health emergency.  Safety protocols were in place, including screening questions prior to the visit, additional usage of staff PPE, and extensive cleaning of exam room while observing appropriate contact time as indicated for disinfecting solutions.

## 2020-10-12 NOTE — Assessment & Plan Note (Signed)
Continues to have leg edema.  I think this is multifactorial with venous insufficiency and hypothyroidism contributing.  Recommend compression stockings, continuation of lasix at current strength and increase in levothyroxine to 323mcg daily.  We'll need to recheck levels in 6-8 weeks.

## 2020-10-16 ENCOUNTER — Other Ambulatory Visit: Payer: Self-pay | Admitting: Family Medicine

## 2020-10-17 NOTE — Progress Notes (Signed)
Chief Complaint:   OBESITY Tracy [name] Tracy [name] [name] (MR# [id number]) is a 55 y.o. female who presents for evaluation Tracy [name] treatment of obesity Tracy [name] related comorbidities. Current BMI is Body mass index is 48.21 kg/m. Tracy [name] Tracy [name] [name] has been struggling with her weight for many years Tracy [name] has been unsuccessful in either losing weight, maintaining weight Tracy [name] [name], or reaching her healthy weight goal.  Tracy [name] Tracy [name] [name] is currently in the action stage of change Tracy [name] ready to dedicate time achieving Tracy [name] maintaining a healthier weight. Tracy [name] Tracy [name] [name] is interested in becoming our patient Tracy [name] working on intensive lifestyle modifications including (but not limited to) diet Tracy [name] exercise for weight Tracy [name] [name].  Tracy [name] Tracy [name] [name]'s habits were reviewed today Tracy [name] are as follows: she thinks her family will eat healthier with her, her desired weight Tracy [name] [name] is 136 lbs, she has been heavy most of her life, she started gaining weight at age 67, her heaviest weight ever was 353 pounds in 2017, she has significant food cravings issues, she snacks frequently in the evenings, she skips meals frequently, she frequently makes poor food choices, she has problems with excessive hunger, she frequently eats larger portions than normal, she has binge eating behaviors Tracy [name] she struggles with emotional eating.  Depression Screen Tracy [name] Tracy [name] [name]'s Food Tracy [name] Mood (modified PHQ-9) score was 19.  Depression screen PHQ 2/9 10/11/2020  Decreased Interest 3  Down, Depressed, Hopeless 3  PHQ - 2 Score 6  Altered sleeping 1  Tired, decreased energy 3  Change in appetite 3  Feeling bad or failure about yourself  3  Trouble concentrating 2  Moving slowly or fidgety/restless 0  Suicidal thoughts 1  PHQ-9 Score 19  Difficult doing work/chores Very difficult  Some recent data might be hidden   Subjective:   1. Other fatigue Tracy [name] Tracy [name] [name] admits to daytime somnolence Tracy [name] admits to waking up still tired. Tracy [name] [name] has a history of symptoms of daytime fatigue. Tracy [name] [name] generally gets 5 or 6 hours of sleep  per night, Tracy [name] states that she has nightime awakenings. Snoring is present. Apneic episodes are present. Epworth Sleepiness Score is 5.  2. Shortness of breath on exertion Tracy [name] Tracy [name] [name] notes increasing shortness of breath with exercising Tracy [name] seems to be worsening over time with weight gain. She notes getting out of breath sooner with activity than she used to. This has not gotten worse recently. Tracy [name] Tracy [name] [name] denies shortness of breath at rest or orthopnea.  3. Type 2 diabetes mellitus with other specified complication, without long-term current use of insulin (HCC) Tracy [name] Tracy [name] [name]'s recent A1c was elevated at 8.0. her Tracy [name] [name] BGs ranges between 150 Tracy [name] 400, mostly 200's. She is on metformin.  4. Vitamin D deficiency Tracy [name] Tracy [name] [name] is not on Vit D, Tracy [name] she notes fatigue.  5. 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 She is on Synthroid Tracy [name] last year's TSH was within normal limits.  6. Essential hypertension Tracy [name] Tracy [name] [name]'s blood pressure is controlled on her medications. She denies chest pain, Tracy [name] she is ready to work on weight Tracy [name] [name].  7. Mixed hyperlipidemia Tracy [name] Tracy [name] [name] is on statin, Tracy [name] she is ready to work on diet Tracy [name] weight Tracy [name] [name]. She is due for labs.  8. Bipolar affective disorder, currently depressed, moderate (Tracy [name] [name]) Tracy [name] Tracy [name] [name] appears to be stable on her medications, but she notes some emotional eating behaviors.  9. At risk for heart Tracy [name] [name] Tracy [name] Tracy [name] [name] is at a higher than average risk for cardiovascular Tracy [name] [name] due to obesity.   Assessment/Plan:   1. Other fatigue Tracy [name] Tracy [name] [name] does feel that her weight is causing her energy to be lower than it should be.  Fatigue may be related to obesity, depression or many other causes. Labs will be ordered, Tracy [name] in the meanwhile, Tracy [name] will focus on self care including making healthy food choices, increasing physical activity Tracy [name] focusing on stress reduction.  - Vitamin B12 - CBC with Differential/Platelet - EKG 12-Lead - Folate  2. Shortness of breath on exertion Tracy [name] Tracy [name] [name] does feel  that she gets out of breath more easily that she used to when she exercises. Tracy [name] [name]'s shortness of breath appears to be obesity related Tracy [name] exercise induced. She has agreed to work on weight Tracy [name] [name] Tracy [name] gradually increase exercise to treat her exercise induced shortness of breath. Will continue to monitor closely.  3. Type 2 diabetes mellitus with other specified complication, without long-term current use of insulin (HCC) Tracy [name] Tracy [name] [name], Tracy [name] Tracy [name] [name], Tracy [name] Tracy [name] [name], Tracy [name] Tracy [name] [name], Tracy [name] Tracy [name] [name], Tracy [name] Tracy [name] [name]. Intensive lifestyle modification including diet, exercise Tracy [name] weight Tracy [name] [name] are the first line of treatment for diabetes. Tracy [name] Tracy [name] [name] insulin today, Tracy [name] [name] will start her Category 3 plan.  - Insulin, random  4. Vitamin D deficiency Low Vitamin D level contributes to fatigue Tracy [name] are associated with obesity, breast, Tracy [name] colon cancer. Tracy [name] will check labs today, Tracy [name] Tracy [name] [name] will follow-up for routine testing of Vitamin D, at least 2-3 times per year to avoid over-replacement.  - VITAMIN D 25 Hydroxy (Vit-D Deficiency, Fractures)  5. Hypothyroidism, postsurgical Tracy [name] will check labs today. Tracy [name] Tracy [name] [name] will continue to follow up as directed. Orders Tracy [name] follow up as documented in patient record.  - T3 - T4 - TSH  6. Essential hypertension Tracy [name] will check labs today. Tracy [name] [name] will start her Category 3 plan, Tracy [name] will work on healthy weight Tracy [name] [name] Tracy [name] exercise to improve blood pressure control. Tracy [name] will watch for signs of hypotension as she continues her lifestyle modifications.  - Comprehensive metabolic panel  7. Mixed hyperlipidemia Cardiovascular risk Tracy [name] specific lipid/LDL goals reviewed. Tracy [name] discussed several lifestyle modifications today. Tracy [name] will check labs today. [name] will start her Category 3 plan, Tracy [name] will wwork on diet, exercise Tracy [name] weight Tracy [name] [name] efforts. Orders Tracy [name] follow up as documented in patient  record.   - Lipid Panel With LDL/HDL Ratio  8. Bipolar affective disorder, currently depressed, moderate ([place]) Tracy [name] will refer Narda to Dr. Mallie Mussel, our Bariatric Psychologist for evaluation.  9. Screening for depression Venus had a positive depression screening. Depression is commonly associated with obesity Tracy [name] often results in emotional eating behaviors. Tracy [name] will monitor this closely Tracy [name] work on CBT to help improve the non-hunger eating patterns. Referral to Psychology may be required if no improvement is seen as she continues in our clinic.  10. At risk for heart Tracy [name] [name] Sheniah was given approximately 30 minutes of Tracy [name] Tracy [name] [name] prevention counseling today. She is 55 y.o. female Tracy [name] has risk factors for heart Tracy [name] [name] including obesity. Tracy [name] discussed intensive lifestyle modifications today with an emphasis on specific weight Tracy [name] [name] instructions Tracy [name] strategies.   Repetitive spaced learning was employed today to elicit superior memory formation Tracy [name] behavioral change.  11. Class 3 severe obesity with serious comorbidity Tracy [name] body mass index (BMI) of 45.0 to 49.9 in adult, unspecified obesity type [facility]) Aariana is currently in the action stage of change Tracy [name] her goal is to continue with weight Tracy [name] [name] efforts. I recommend Dajon begin the structured treatment plan as follows:  She has agreed to the Category 3 Plan.  Exercise goals: No exercise has been prescribed at this time.  Behavioral modification strategies: increasing lean protein intake Tracy [name] meal planning Tracy [name] cooking strategies.  She was informed of the importance of frequent follow-up visits to maximize her success with intensive lifestyle modifications for her multiple health conditions. She was informed Tracy [name] would discuss her lab results at her next visit unless there is a critical issue that needs to be addressed sooner. Sydelle agreed to keep her next visit at the agreed upon time to discuss these results.  Objective:   Blood pressure  111/72, pulse 78, temperature 97.9 F (36.6 C), height 5\' 10"  (1.778 m), weight (!) 336 lb (152.4 kg), last menstrual period 08/24/2020, SpO2 98 %. Body mass index is 48.21 kg/m.  EKG: Normal sinus rhythm, rate 79 BPM.  Indirect Calorimeter completed today shows a VO2 of 422 Tracy [name] a REE of 2938.  Her calculated basal metabolic rate is 7048 thus her basal metabolic rate is better than expected.  General: Cooperative, alert, well developed, in no acute distress. HEENT: Conjunctivae Tracy [name] lids unremarkable. Cardiovascular: Regular rhythm.  Lungs: Normal work of breathing. Neurologic: No focal deficits.   Lab Results  Component Value Date   CREATININE 0.79 10/11/2020   BUN 14 10/11/2020   NA 140 10/11/2020   K 4.2 10/11/2020   CL 102 10/11/2020   CO2 23 10/11/2020   Lab Results  Component Value Date   ALT 31 10/11/2020   AST 32 10/11/2020   ALKPHOS 126 (H) 10/11/2020   BILITOT <0.2 10/11/2020   Lab Results  Component Value Date   HGBA1C 8.0 (H) 09/21/2020   HGBA1C 5.8 (H) 03/01/2020   HGBA1C 4.9 03/16/2019   HGBA1C 5.1 03/04/2019   HGBA1C 5.1 10/08/2018   Lab Results  Component Value Date   INSULIN 77.2 (H) 10/11/2020   Lab Results  Component Value Date   TSH 10.600 (H) 10/11/2020   Lab Results  Component Value Date   CHOL 155 10/11/2020   HDL 44 10/11/2020   LDLCALC 85 10/11/2020   TRIG 152 (H) 10/11/2020   CHOLHDL 6.0 (H) 03/01/2020   Lab Results  Component Value Date   WBC 4.8 10/11/2020   HGB 10.9 (L) 10/11/2020   HCT 35.6 10/11/2020   MCV 78 (L) 10/11/2020   PLT 224 10/11/2020   No results found for: IRON, TIBC, FERRITIN  Attestation Statements:   Reviewed by clinician on day of visit: allergies, medications, problem list, medical history, surgical history, family history, social history, Tracy [name] previous encounter notes.   I, Trixie Dredge, am acting as transcriptionist for Dennard Nip, MD.  I have reviewed the above documentation for accuracy Tracy [name]  completeness, Tracy [name] I agree with the above. - Dennard Nip, MD

## 2020-10-18 DIAGNOSIS — F3181 Bipolar II disorder: Secondary | ICD-10-CM | POA: Diagnosis not present

## 2020-10-19 ENCOUNTER — Telehealth (INDEPENDENT_AMBULATORY_CARE_PROVIDER_SITE_OTHER): Payer: 59 | Admitting: Psychology

## 2020-10-19 DIAGNOSIS — F3189 Other bipolar disorder: Secondary | ICD-10-CM | POA: Diagnosis not present

## 2020-10-19 NOTE — Progress Notes (Unsigned)
Office: [phone]  /  Fax: [phone]    Date: November 02, 2020   Appointment Start Time: *** Duration: *** minutes Provider: Glennie Isle, Psy.D. Type of Session: Individual Therapy  Location of Patient: {gbptloc:23249} Location of Provider: Provider's Home (private office) Type of Contact: Telepsychological Visit via MyChart Video Visit  Session Content: Tracy [name] is a 55 y.o. female presenting for a follow-up appointment to address the previously established treatment goal of increasing coping skills. Today's appointment was a telepsychological visit due to COVID-19. Tracy [name] provided verbal consent for today's telepsychological appointment and she is aware she is responsible for securing confidentiality on her end of the session. Prior to proceeding with today's appointment, Tracy [name]'s physical location at the time of this appointment was obtained as well a phone number she could be reached at in the event of technical difficulties. Tracy [name] and this provider participated in today's telepsychological service.   This provider conducted a brief check-in and verbally administered the PHQ-9 and GAD-7. ***Psychoeducation regarding triggers for emotional eating was provided. Tracy [name] was provided a handout, and encouraged to utilize the handout between now and the next appointment to increase awareness of triggers and frequency. Tracy [name] agreed. This provider also discussed behavioral strategies for specific triggers, such as placing the utensil down when conversing to avoid mindless eating. Tracy [name] provided verbal consent during today's appointment for this provider to send *** via e-mail.  Tracy [name] was receptive to today's appointment as evidenced by openness to sharing, responsiveness to feedback, and {gbreceptiveness:23401}.  Mental Status Examination:  Appearance: {Appearance:22431} Behavior: {Behavior:22445} Mood: {gbmood:21757} Affect: {Affect:22436} Speech: {Speech:22432} Eye Contact: {Eye  Contact:22433} Psychomotor Activity: {Motor Activity:22434} Gait: {gbgait:23404} Thought Process: {thought process:22448}  Thought Content/Perception: {disturbances:22451} Orientation: {Orientation:22437} Memory/Concentration: {gbcognition:22449} Insight/Judgment: {Insight:22446}  Structured Assessments Results: The Patient Health Questionnaire-9 (PHQ-9) is a self-report measure that assesses symptoms and severity of depression over the course of the last two weeks. Tracy [name] obtained a score of *** suggesting {GBPHQ9SEVERITY:21752}. Tracy [name] finds the endorsed symptoms to be {gbphq9difficulty:21754}. [0= Not at all; 1= Several days; 2= More than half the days; 3= Nearly every day] Little interest or pleasure in doing things ***  Feeling down, depressed, or hopeless ***  Trouble falling or staying asleep, or sleeping too much ***  Feeling tired or having little energy ***  Poor appetite or overeating ***  Feeling bad about yourself --- or that you are a failure or have let yourself or your family down ***  Trouble concentrating on things, such as reading the newspaper or watching television ***  Moving or speaking so slowly that other people could have noticed? Or the opposite --- being so fidgety or restless that you have been moving around a lot more than usual ***  Thoughts that you would be better off dead or hurting yourself in some way ***  PHQ-9 Score ***    The Generalized Anxiety Disorder-7 (GAD-7) is a brief self-report measure that assesses symptoms of anxiety over the course of the last two weeks. Tracy [name] obtained a score of *** suggesting {gbgad7severity:21753}. Tracy [name] finds the endorsed symptoms to be {gbphq9difficulty:21754}. 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 Tracy [name] has demonstrated progress in her goal as evidenced by {gbtxprogress:22839}. Tracy [name] also {gbtxprogress2:22951}.  Plan: The next appointment will be scheduled in {gbweeks:21758}, which will be {gbtxmodality:23402}. The next session will focus on {Plan for Next Appointment:23400}.

## 2020-10-25 ENCOUNTER — Ambulatory Visit (INDEPENDENT_AMBULATORY_CARE_PROVIDER_SITE_OTHER): Payer: 59 | Admitting: Family Medicine

## 2020-10-25 ENCOUNTER — Other Ambulatory Visit: Payer: Self-pay

## 2020-10-25 ENCOUNTER — Ambulatory Visit: Payer: 59 | Admitting: Family Medicine

## 2020-10-25 ENCOUNTER — Other Ambulatory Visit (INDEPENDENT_AMBULATORY_CARE_PROVIDER_SITE_OTHER): Payer: Self-pay | Admitting: Family Medicine

## 2020-10-25 ENCOUNTER — Encounter (INDEPENDENT_AMBULATORY_CARE_PROVIDER_SITE_OTHER): Payer: Self-pay | Admitting: Family Medicine

## 2020-10-25 VITALS — BP 118/75 | HR 97 | Temp 98.2°F | Ht 70.0 in | Wt 331.0 lb

## 2020-10-25 DIAGNOSIS — E559 Vitamin D deficiency, unspecified: Secondary | ICD-10-CM

## 2020-10-25 DIAGNOSIS — E1169 Type 2 diabetes mellitus with other specified complication: Secondary | ICD-10-CM | POA: Diagnosis not present

## 2020-10-25 DIAGNOSIS — Z9189 Other specified personal risk factors, not elsewhere classified: Secondary | ICD-10-CM | POA: Diagnosis not present

## 2020-10-25 DIAGNOSIS — Z6841 Body Mass Index (BMI) 40.0 and over, adult: Secondary | ICD-10-CM

## 2020-10-25 DIAGNOSIS — E890 Postprocedural hypothyroidism: Secondary | ICD-10-CM

## 2020-10-25 MED ORDER — VITAMIN D (ERGOCALCIFEROL) 1.25 MG (50000 UNIT) PO CAPS: 50000.0000 [IU] | ORAL_CAPSULE | ORAL | 0 refills | Status: DC

## 2020-10-25 MED ORDER — OZEMPIC (0.25 OR 0.5 MG/DOSE) 2 MG/1.5ML ~~LOC~~ SOPN: 0.2500 mg | PEN_INJECTOR | SUBCUTANEOUS | 0 refills | Status: DC

## 2020-10-27 ENCOUNTER — Other Ambulatory Visit (HOSPITAL_BASED_OUTPATIENT_CLINIC_OR_DEPARTMENT_OTHER): Payer: Self-pay

## 2020-10-31 NOTE — Progress Notes (Signed)
Chief Complaint:   OBESITY Tracy [name] is here to discuss her progress with her obesity treatment plan along with follow-up of her obesity related diagnoses. Tracy [name] is on the Category 3 Plan and states she is following her eating plan approximately 75% of the time. Tracy [name] states she is doing 0 minutes 0 times per week.  Today's visit was #: 2 Starting weight: 336 lbs Starting date: 10/11/2020 Today's weight: 331 lbs Today's date: 10/25/2020 Total lbs lost to date: 5 Total lbs lost since last in-office visit: 5  Interim History: Tracy [name] has done very well with weight loss on her eating plan. She ate everything, but still had some excessive hunger issues at times.  Subjective:   1. Hypothyroidism, postsurgical Tracy [name] is on Synthroid and she has struggled to get good control. Her TSH is elevated but her T3 and T4 are within normal limits. She works hard to take her medications appropriately. I discussed labs with the patient today.  2. Vitamin D deficiency Tracy [name]'s Vit D level is low, and she notes significant fatigue. I discussed labs with the patient today.  3. Type 2 diabetes mellitus with other specified complication, without long-term current use of insulin (Tracy [name]) Fabiola is on metformin, and she is working on diet but her A1c, insulin, and glucose are all elevated. She notes significant polyphagia. I discussed labs with the patient today.  4. At risk for heart disease Tracy [name] is at a higher than average risk for cardiovascular disease due to obesity.   Assessment/Plan:   1. Hypothyroidism, postsurgical Tracy [name] will continue to work on diet and weight loss, and will continue to monitor. Her doses will likely change with weight loss as well. Orders and follow up as documented in patient record.  2. Vitamin D deficiency Low Vitamin D level contributes to fatigue and are associated with obesity, breast, and colon cancer. Tracy [name] agreed to start prescription Vitamin D 50,000 IU every week with no  refills. We will recheck labs in 3 month. She will follow-up for routine testing of Vitamin D, at least 2-3 times per year to avoid over-replacement.  - Vitamin D, Ergocalciferol, (DRISDOL) 1.25 MG (50000 UNIT) CAPS capsule; Take 1 capsule (50,000 Units total) by mouth every 7 (seven) days.  Dispense: 4 capsule; Refill: 0  3. Type 2 diabetes mellitus with other specified complication, without long-term current use of insulin (HCC) Good blood sugar control is important to decrease the likelihood of diabetic complications such as nephropathy, neuropathy, limb loss, blindness, coronary artery disease, and death. Intensive lifestyle modification including diet, exercise and weight loss are the first line of treatment for diabetes. Tracy [name] agreed to start Ozempic at 0.25 mg q weekly with no refills, and she will continue metformin as is.  - Semaglutide,0.25 or 0.5MG /DOS, (OZEMPIC, 0.25 OR 0.5 MG/DOSE,) 2 MG/1.5ML SOPN; Inject 0.25 mg into the skin every 7 (seven) days.  Dispense: 1.5 mL; Refill: 0  4. At risk for heart disease Tracy [name] was given approximately 30 minutes of coronary artery disease prevention counseling today. She is 55 y.o. female and has risk factors for heart disease including obesity. We discussed intensive lifestyle modifications today with an emphasis on specific weight loss instructions and strategies.   Repetitive spaced learning was employed today to elicit superior memory formation and behavioral change.  5. Obesity with current BMI 47.6 Tracy [name] is currently in the action stage of change. As such, her goal is to continue with weight loss efforts. She has agreed to the Category 3 Plan  with lean meat equivalents.   Behavioral modification strategies: increasing lean protein intake and meal planning and cooking strategies.  Tracy [name] has agreed to follow-up with our clinic in 2 weeks. She was informed of the importance of frequent follow-up visits to maximize her success with intensive  lifestyle modifications for her multiple health conditions.   Objective:   Blood pressure 118/75, pulse 97, temperature 98.2 F (36.8 C), height 5\' 10"  (1.778 m), weight (!) 331 lb (150.1 kg), SpO2 96 %. Body mass index is 47.49 kg/m.  General: Cooperative, alert, well developed, in no acute distress. HEENT: Conjunctivae and lids unremarkable. Cardiovascular: Regular rhythm.  Lungs: Normal work of breathing. Neurologic: No focal deficits.   Lab Results  Component Value Date   CREATININE 0.79 10/11/2020   BUN 14 10/11/2020   NA 140 10/11/2020   K 4.2 10/11/2020   CL 102 10/11/2020   CO2 23 10/11/2020   Lab Results  Component Value Date   ALT 31 10/11/2020   AST 32 10/11/2020   ALKPHOS 126 (H) 10/11/2020   BILITOT <0.2 10/11/2020   Lab Results  Component Value Date   HGBA1C 8.0 (H) 09/21/2020   HGBA1C 5.8 (H) 03/01/2020   HGBA1C 4.9 03/16/2019   HGBA1C 5.1 03/04/2019   HGBA1C 5.1 10/08/2018   Lab Results  Component Value Date   INSULIN 77.2 (H) 10/11/2020   Lab Results  Component Value Date   TSH 10.600 (H) 10/11/2020   Lab Results  Component Value Date   CHOL 155 10/11/2020   HDL 44 10/11/2020   LDLCALC 85 10/11/2020   TRIG 152 (H) 10/11/2020   CHOLHDL 6.0 (H) 03/01/2020   Lab Results  Component Value Date   WBC 4.8 10/11/2020   HGB 10.9 (L) 10/11/2020   HCT 35.6 10/11/2020   MCV 78 (L) 10/11/2020   PLT 224 10/11/2020   No results found for: IRON, TIBC, FERRITIN  Attestation Statements:   Reviewed by clinician on day of visit: allergies, medications, problem list, medical history, surgical history, family history, social history, and previous encounter notes.   I, Trixie Dredge, am acting as transcriptionist for Dennard Nip, MD.  I have reviewed the above documentation for accuracy and completeness, and I agree with the above. -  Dennard Nip, MD

## 2020-11-01 ENCOUNTER — Telehealth (INDEPENDENT_AMBULATORY_CARE_PROVIDER_SITE_OTHER): Payer: 59 | Admitting: Psychology

## 2020-11-01 DIAGNOSIS — F3189 Other bipolar disorder: Secondary | ICD-10-CM

## 2020-11-01 NOTE — Progress Notes (Unsigned)
Office: [phone]  /  Fax: [phone]    Date: November 15, 2020   Appointment Start Time: *** Duration: *** minutes Provider: Glennie Isle, Psy.D. Type of Session: Individual Therapy  Location of Patient: {gbptloc:23249} Location of Provider: Provider's Home (private office) Type of Contact: Telepsychological Visit via MyChart Video Visit  Session Content: Tracy [name] is a 55 y.o. female presenting for a follow-up appointment to address the previously established treatment goal of increasing coping skills. Today's appointment was a telepsychological visit due to COVID-19. Akira provided verbal consent for today's telepsychological appointment and she is aware she is responsible for securing confidentiality on her end of the session. Prior to proceeding with today's appointment, Saori's physical location at the time of this appointment was obtained as well a phone number she could be reached at in the event of technical difficulties. Wynelle and this provider participated in today's telepsychological service.   This provider conducted a brief check-in and verbally administered the PHQ-9 and GAD-7. *** Kashmere was receptive to today's appointment as evidenced by openness to sharing, responsiveness to feedback, and {gbreceptiveness:23401}.  Mental Status Examination:  Appearance: {Appearance:22431} Behavior: {Behavior:22445} Mood: {gbmood:21757} Affect: {Affect:22436} Speech: {Speech:22432} Eye Contact: {Eye Contact:22433} Psychomotor Activity: {Motor Activity:22434} Gait: {gbgait:23404} Thought Process: {thought process:22448}  Thought Content/Perception: {disturbances:22451} Orientation: {Orientation:22437} Memory/Concentration: {gbcognition:22449} Insight/Judgment: {Insight:22446}  Structured Assessments Results: The Patient Health Questionnaire-9 (PHQ-9) is a self-report measure that assesses symptoms and severity of depression over the course of the last two weeks. Joselyn obtained a  score of *** suggesting {GBPHQ9SEVERITY:21752}. Jeronda finds the endorsed symptoms to be {gbphq9difficulty:21754}. [0= Not at all; 1= Several days; 2= More than half the days; 3= Nearly every day] Little interest or pleasure in doing things ***  Feeling down, depressed, or hopeless ***  Trouble falling or staying asleep, or sleeping too much ***  Feeling tired or having little energy ***  Poor appetite or overeating ***  Feeling bad about yourself --- or that you are a failure or have let yourself or your family down ***  Trouble concentrating on things, such as reading the newspaper or watching television ***  Moving or speaking so slowly that other people could have noticed? Or the opposite --- being so fidgety or restless that you have been moving around a lot more than usual ***  Thoughts that you would be better off dead or hurting yourself in some way ***  PHQ-9 Score ***    The Generalized Anxiety Disorder-7 (GAD-7) is a brief self-report measure that assesses symptoms of anxiety over the course of the last two weeks. Yani obtained a score of *** suggesting {gbgad7severity:21753}. Virgilia finds the endorsed symptoms to be {gbphq9difficulty:21754}. 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 Alisson has demonstrated progress  in her goal as evidenced by {gbtxprogress:22839}. Rexanne also {gbtxprogress2:22951}.  Plan: The next appointment will be scheduled  in {gbweeks:21758}, which will be {gbtxmodality:23402}. The next session will focus on {Plan for Next Appointment:23400}.

## 2020-11-01 NOTE — Progress Notes (Signed)
Office: [phone]  /  Fax: [phone]    Date: November 01, 2020    Appointment Start Time: 2:05pm Duration: 29 minutes Provider: Glennie Isle, Psy.D. Type of Session: Individual Therapy  Location of Patient: Parked in car in safe location Location of Provider: Provider's Home (private office) Type of Contact: Telepsychological Visit via MyChart Video Visit  Session Content: This provider called Tracy [name] at 2:02pm as she did not present for the telepsychological appointment. She noted she was driving but would pull over for the appointment. As such, today's appointment was initiated 5 minutes late. Tracy [name] is a 55 y.o. female presenting for a follow-up appointment to address the previously established treatment goal of increasing coping skills. Today's appointment was a telepsychological visit due to COVID-19. Tracy [name] provided verbal consent for today's telepsychological appointment and she is aware she is responsible for securing confidentiality on her end of the session. Prior to proceeding with today's appointment, Tracy [name]'s physical location at the time of this appointment was obtained as well a phone number she could be reached at in the event of technical difficulties. Tracy [name] and this provider participated in today's telepsychological service.   This provider conducted a brief check-in. Tracy [name] reported her appointment with her other therapist is tomorrow; therefore, she rescheduled her appointment with this provider from tomorrow to today. She shared about recent events, including celebrating her birthday and moving ahead with her separation. Tracy [name] acknowledged she is following her meal plan for breakfast and lunch, but reported deviations during dinner and after.This was further explored. It was identified she is going long periods without eating. She was encouraged to factor in snacks throughout the day; she agreed. Additionally, psychoeducation regarding the consequences of not eating regularly was  provided. Moreover, psychoeducation regarding triggers for emotional eating was provided. Tracy [name] was provided a handout, and encouraged to utilize the handout between now and the next appointment to increase awareness of triggers and frequency. Tracy [name] agreed. This provider also discussed behavioral strategies for specific triggers, such as placing the utensil down when conversing to avoid mindless eating. Tracy [name] provided verbal consent during today's appointment for this provider to send a handout about triggers via e-mail. Tracy [name] was receptive to today's appointment as evidenced by openness to sharing, responsiveness to feedback, and willingness to implement discussed strategies .  Mental Status Examination:  Appearance: well groomed and appropriate hygiene  Behavior: appropriate to circumstances Mood: euthymic Affect: mood congruent Speech: normal in rate, volume, and tone Eye Contact: appropriate Psychomotor Activity: unable to assess  Gait: unable to assess Thought Process: linear, logical, and goal directed  Thought Content/Perception: denies suicidal and homicidal ideation, plan, and intent and no hallucinations, delusions, bizarre thinking or behavior reported or observed Orientation: time, person, place, and purpose of appointment Memory/Concentration: memory, attention, language, and fund of knowledge intact  Insight/Judgment: good  Interventions:  Conducted a brief chart review Conducted a risk assessment Provided empathic reflections and validation Employed supportive psychotherapy interventions to facilitate reduced distress and to improve coping skills with identified stressors Psychoeducation provided regarding triggers for emotional eating Psychoeducation provided regarding not eating regularly  DSM-5 Diagnosis(es): 296.89 (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 Tracy [name] has some demonstrated progress in her goal as evidenced by increased awareness of hunger patterns.   Plan: The next appointment will be scheduled in two weeks, which will be via MyChart Video Visit.  The next session will focus on working towards the established treatment goal.

## 2020-11-02 ENCOUNTER — Telehealth (INDEPENDENT_AMBULATORY_CARE_PROVIDER_SITE_OTHER): Payer: Self-pay | Admitting: Psychology

## 2020-11-02 ENCOUNTER — Ambulatory Visit: Payer: 59 | Admitting: Family Medicine

## 2020-11-02 DIAGNOSIS — F3181 Bipolar II disorder: Secondary | ICD-10-CM | POA: Diagnosis not present

## 2020-11-06 ENCOUNTER — Other Ambulatory Visit (HOSPITAL_COMMUNITY): Payer: Self-pay

## 2020-11-08 ENCOUNTER — Other Ambulatory Visit: Payer: Self-pay

## 2020-11-08 ENCOUNTER — Encounter: Payer: Self-pay | Admitting: Adult Health

## 2020-11-08 ENCOUNTER — Telehealth (INDEPENDENT_AMBULATORY_CARE_PROVIDER_SITE_OTHER): Payer: 59 | Admitting: Family Medicine

## 2020-11-08 ENCOUNTER — Ambulatory Visit (INDEPENDENT_AMBULATORY_CARE_PROVIDER_SITE_OTHER): Payer: 59 | Admitting: Adult Health

## 2020-11-08 ENCOUNTER — Other Ambulatory Visit (HOSPITAL_COMMUNITY): Payer: Self-pay

## 2020-11-08 ENCOUNTER — Encounter (INDEPENDENT_AMBULATORY_CARE_PROVIDER_SITE_OTHER): Payer: Self-pay | Admitting: Family Medicine

## 2020-11-08 VITALS — BP 147/83 | HR 83 | Ht 70.0 in | Wt 345.0 lb

## 2020-11-08 DIAGNOSIS — F411 Generalized anxiety disorder: Secondary | ICD-10-CM

## 2020-11-08 DIAGNOSIS — G4733 Obstructive sleep apnea (adult) (pediatric): Secondary | ICD-10-CM

## 2020-11-08 DIAGNOSIS — F3181 Bipolar II disorder: Secondary | ICD-10-CM | POA: Diagnosis not present

## 2020-11-08 DIAGNOSIS — E559 Vitamin D deficiency, unspecified: Secondary | ICD-10-CM | POA: Diagnosis not present

## 2020-11-08 DIAGNOSIS — E1169 Type 2 diabetes mellitus with other specified complication: Secondary | ICD-10-CM

## 2020-11-08 DIAGNOSIS — F410 Panic disorder [episodic paroxysmal anxiety] without agoraphobia: Secondary | ICD-10-CM | POA: Diagnosis not present

## 2020-11-08 DIAGNOSIS — Z6841 Body Mass Index (BMI) 40.0 and over, adult: Secondary | ICD-10-CM

## 2020-11-08 DIAGNOSIS — Z9189 Other specified personal risk factors, not elsewhere classified: Secondary | ICD-10-CM

## 2020-11-08 MED ORDER — VITAMIN D (ERGOCALCIFEROL) 1.25 MG (50000 UNIT) PO CAPS
ORAL_CAPSULE | ORAL | 0 refills | Status: DC
  Filled 2020-11-08: qty 4, 28d supply, fill #0

## 2020-11-08 MED ORDER — HYDROXYZINE HCL 25 MG PO TABS
25.0000 mg | ORAL_TABLET | Freq: Three times a day (TID) | ORAL | 2 refills | Status: DC | PRN
  Filled 2020-11-08: qty 90, 30d supply, fill #0
  Filled 2021-05-21: qty 180, 60d supply, fill #1

## 2020-11-08 NOTE — Progress Notes (Signed)
[facility] MD/PA/NP Initial Note  11/08/2020 2:36 PM Tracy [name]  MRN:  [id number]  Chief Complaint:   HPI:   Referred by Celso Amy presents to the office today for follow-up of MDD, GAD, PTSD, and Panic attacks.  Describes mood today as "ok". Pleasant. Mood symptoms - depression - "rates 5 out of a 10", anxiety - "always there at different levels", and irritability - "denies". Currently on medications and feels they are helpful. Most recently seen for medication management with Lattie Haw [place]. Reports history of TMS and ECT. Treatment for ADHD previously with Dr Toy Care. Does feel like there is room for improvement. Will have records sent from previous providers for review.  Stable interest and motivation. Taking medications as prescribed.  Energy levels Active, does not have a regular exercise routine. Enjoys some usual interests and activities. Married - plans to divore husband of 16 to 20 years. Siblings locally. Spending time with family. Appetite adequate. Weight loss - 7 pounds. Working with [employer].  Sleeps well most nights. Averages 3 hours. Fearful of going to sleep - "I feel anxious". Sleep apnea - Nuvigil in the morning.  Focus and concentration difficulties - unable to read. Completing tasks. Managing aspects of household. 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 Denies SI or HI.  Denies AH or VH. History of hearing mother calling her name - seeing a black object.  Previous medication trials: Delton [place], Pristiq, Wellbutrin-allergy, Effexor, Cymbalta,  Depakote, Lithium  Previous treatment: ECT and [place]  Visit Diagnosis:    ICD-10-CM   1. Bipolar II disorder ([place])  F31.81   2. GAD (generalized anxiety disorder)  F41.1 hydrOXYzine (ATARAX/VISTARIL) 25 MG tablet  3. Panic attacks  F41.0   4. OSA (obstructive sleep apnea)  G47.33     Past Psychiatric History:  Hospitalized for mood  instability. Has been to [facility].   Past Medical History:  Past Medical History:  Diagnosis Date  . Anxiety   . Arthritis    back, knees, right elbow  . Back pain   . Bilateral swelling of feet and ankles   . Bipolar disorder ([place])   . Borderline personality disorder ([place])   . Cancer (HCC)    oral  . Chewing difficulty   . Cirrhosis ([place])   . Constipation   . Dental crowns present   . Depression   . Diabetes mellitus without complication ([place])   . Fatty liver   . Food allergy    Walnuts  . GAD (generalized anxiety disorder)   . GERD (gastroesophageal reflux disease)   . High cholesterol    no current med.  Marland Kitchen History of kidney stones   . History of migraine headaches   . Hypertension    under control with meds., has been on med. x 4 yr.  . Hypothyroidism   . IBS (irritable bowel syndrome)   . Infertility, female   . Joint pain   . Lichen planus   . Liver cirrhosis secondary to NASH (nonalcoholic steatohepatitis) ([place])   . NASH (nonalcoholic steatohepatitis)   . Osteoarthritis   . Other fatigue   . Palpitations   . PCOS (polycystic ovarian syndrome)   . PONV (postoperative nausea and vomiting)    also hx. of emergence delirium 2007  . Shortness of breath on exertion   . Sleep apnea    uses CPAP nightly  . Trigeminal neuralgia   . Type 2 diabetes  mellitus ([place])   . Vitamin D deficiency     Past Surgical History:  Procedure Laterality Date  . ACHILLES TENDON SURGERY Left 2007  . APPENDECTOMY    . CARPAL TUNNEL RELEASE Right 01/12/2016   Procedure: RIGHT CARPAL TUNNEL RELEASE;  Surgeon: Roseanne Kaufman, MD;  Location: [facility];  Service: Orthopedics;  Laterality: Right;  . COLONOSCOPY WITH PROPOFOL N/A 04/20/2014   Procedure: COLONOSCOPY WITH PROPOFOL;  Surgeon: Arta Silence, MD;  Location: [facility];  Service: Endoscopy;  Laterality: N/A;  . ESOPHAGOGASTRODUODENOSCOPY (EGD) WITH PROPOFOL N/A 04/20/2014   Procedure:  ESOPHAGOGASTRODUODENOSCOPY (EGD) WITH PROPOFOL;  Surgeon: Arta Silence, MD;  [facility];  Service: Endoscopy;  Laterality: N/A;  . EXCISION HAGLUND'S DEFORMITY WITH ACHILLES TENDON REPAIR Right 02/25/2013   Procedure: RIGHT ACHILLES DEBRIDEMENT AND RECONSTRUCTION;  HAGLUND'S EXCISION; GASTROC RECESSION AND FLEXOR HALLUCIS LONGUS TRANSFER;  Surgeon: Wylene Simmer, MD;  Location: [facility];  Service: Orthopedics;  Laterality: Right;  . JOINT REPLACEMENT     hip  . LUMBAR LAMINECTOMY     X 3  . neck fusion    . OVARIAN CYST SURGERY Left 1998  . PILONIDAL CYST EXCISION  1994  . RIGHT OOPHORECTOMY    . STERIOD INJECTION Left 01/12/2016   Procedure: STEROID INJECTION LEFT WRIST;  Surgeon: Roseanne Kaufman, MD;  Location: [facility];  Service: Orthopedics;  Laterality: Left;  . TOTAL HIP ARTHROPLASTY Left 03/19/2019   Procedure: LEFT TOTAL HIP ARTHROPLASTY ANTERIOR APPROACH;  Surgeon: Mcarthur Rossetti, MD;  [facility];  Service: Orthopedics;  Laterality: Left;  . TOTAL THYROIDECTOMY  2003  . UPPER GI ENDOSCOPY  01/19/2015    Family Psychiatric History:  Family history of mental illness.  Family History:  Family History  Problem Relation Age of Onset  . Heart disease Father   . Depression Father   . Heart attack Father   . Sudden death Father   . Anxiety disorder Father   . Bipolar disorder Father   . Obesity Father   . Cancer Brother        THROAT colon  . Depression Brother   . Anxiety disorder Brother   . Depression Sister   . Breast cancer Mother   . Hypertension Mother   . Cancer Mother   . Depression Mother   . Sleep apnea Mother   . Obesity Mother   . Depression Maternal Grandmother   . Stroke Maternal Grandfather     Social History:  Social History   Socioeconomic History  . Marital status: Legally Separated    Spouse name: Not on file  . Number of children: 0  . Years of education: 64  . Highest education level:  Not on file  Occupational History  . Occupation: RN-Telephone Triage Nurse    Employer: [employer]: [employer] center triage  Tobacco Use  . Smoking status: Former Smoker    Packs/day: 0.50    Years: 15.00    Pack years: 7.50    Types: Cigarettes    Quit date: 02/2019    Years since quitting: 1.7  . Smokeless tobacco: Never Used  Vaping Use  . Vaping Use: Never used  Substance and Sexual Activity  . Alcohol use: No    Alcohol/week: 0.0 standard drinks    Comment: Has not had any alcohol since 07/2014 - before this date, she rarely drink.  . Drug use: No  . Sexual activity: Not Currently  Partners: Male    Birth control/protection: Condom  Other Topics Concern  . Not on file  Social History Narrative   RN - [employer]   Regular exercise: no   Caffeine use: 2 x daily   Right-handed.   Lives alone.   Social Determinants of Health   Financial Resource Strain: Not on file  Food Insecurity: Not on file  Transportation Needs: Not on file  Physical Activity: Not on file  Stress: Not on file  Social Connections: Not on file    Allergies:  Allergies  Allergen Reactions  . Sulfa Antibiotics Itching, Swelling and Rash  . Bupropion Hcl Itching and Rash  . Codeine Itching  . Sulfamethoxazole-Trimethoprim Rash    Metabolic Disorder Labs: Lab Results  Component Value Date   HGBA1C 8.0 (H) 09/21/2020   MPG 183 09/21/2020   MPG 120 03/01/2020   Lab Results  Component Value Date   PROLACTIN 6.2 01/03/2012   Lab Results  Component Value Date   CHOL 155 10/11/2020   TRIG 152 (H) 10/11/2020   HDL 44 10/11/2020   CHOLHDL 6.0 (H) 03/01/2020   VLDL 29.6 03/25/2018   LDLCALC 85 10/11/2020   LDLCALC 169 (H) 03/01/2020   Lab Results  Component Value Date   TSH 10.600 (H) 10/11/2020   TSH 2.62 03/01/2020    Therapeutic Level Labs: No results found for: LITHIUM No results found for: VALPROATE No components found for:  CBMZ  Current  Medications: Current Outpatient Medications  Medication Sig Dispense Refill  . hydrOXYzine (ATARAX/VISTARIL) 25 MG tablet Take 1 tablet (25 mg total) by mouth 3 (three) times daily as needed. 90 tablet 2  . sertraline (ZOLOFT) 100 MG tablet Take 100 mg by mouth daily. Take two tablets daily.    Marland Kitchen ALPRAZolam (XANAX XR) 0.5 MG 24 hr tablet TAKE 1 TABLET BY MOUTH 2 TIMES DAILY 180 tablet 0  . ARIPiprazole (ABILIFY) 10 MG tablet Take 10 mg by mouth at bedtime.    . Blood Glucose Monitoring Suppl (FREESTYLE LITE) w/Device KIT USE TO CHECK BLOOD SUGAR ONCE A DAY 1 kit 0  . Blood Glucose Monitoring Suppl (FREESTYLE LITE) w/Device KIT USE TO CHECK BLOOD SUGAR ONCE A DAY 1 kit 0  . Carbamazepine (EQUETRO) 300 MG CP12 Take 2 capsules by mouth at bedtime. Take 2 capsules by mouth daily at bedtime.    . clobetasol (TEMOVATE) 0.05 % GEL APPLY TOPICALLY 2 (TWO) TIMES DAILY DRY AREAS FIRST AND THEN APPLY IN THIN LAYER. BE STILL (NO DRINKING, EATING, OR SPEAKING FOR 20 MINUTES 60 g 1  . clonazePAM (KLONOPIN) 1 MG tablet Take 1 mg by mouth 3 (three) times daily.   5  . cyclobenzaprine (FLEXERIL) 10 MG tablet TAKE 1 TABLET BY MOUTH 2 TIMES DAILY AS NEEDED FOR MUSCLE SPASMS 20 tablet 0  . furosemide (LASIX) 40 MG tablet TAKE 1 TABLET BY MOUTH DAILY. 30 tablet 3  . glucose blood test strip USE TO CHECK BLOOD SUGAR ONCE A DAY (Patient taking differently: USE TO CHECK BLOOD SUGAR ONCE A DAY) 100 strip 12  . ketorolac (TORADOL) 10 MG tablet TAKE 1 TABLET BY MOUTH EVERY 6 HOURS AS NEEDED 20 tablet 0  . Lancets (FREESTYLE) lancets USE TO TEST BLOOD SUGAR ONCE A DAY (Patient taking differently: USE TO TEST BLOOD SUGAR ONCE A DAY) 100 each 0  . lidocaine (XYLOCAINE) 2 % solution SWISH AND SPIT 5 MLS 4 TIMES DAILY AS NEEDED FOR PAIN FOR UP TO 10 DAYS 100  mL 0  . lidocaine (XYLOCAINE) 5 % ointment APPLY 1 G TOPICALLY 3 (THREE) TIMES DAILY BEFORE MEALS (DOESNT COME IN GEL, FAXED DR) 1063.2 g 2  . lisinopril-hydrochlorothiazide  (ZESTORETIC) 10-12.5 MG tablet TAKE 1 TABLET BY MOUTH ONCE DAILY 90 tablet 1  . metFORMIN (GLUCOPHAGE-XR) 500 MG 24 hr tablet TAKE 2 TABLETS BY MOUTH ONCE A DAY WITH BREAKFAST 180 tablet 1  . NUVIGIL 200 MG TABS Take 1 tablet by mouth in the morning.    . nystatin (MYCOSTATIN/NYSTOP) powder Apply 1 application topically 3 (three) times daily. 60 g 3  . ondansetron (ZOFRAN-ODT) 4 MG disintegrating tablet 4 mg 2 (two) times daily as needed.    . pimecrolimus (ELIDEL) 1 % cream APPLY TOPICALLY 2 TIMES DAILY TO COMPLETELY DRY AREA PLACE A THIN LAYER ONTO THE MOST SORE AREAS PRIOR TO BED. DO NOT RINSE 60 g 0  . RABEprazole (ACIPHEX) 20 MG tablet TAKE 1 TABLET BY MOUTH DAILY 90 tablet 1  . rizatriptan (MAXALT) 10 MG tablet Take 1 tablet (10 mg total) by mouth as needed for migraine. May repeat in 2 hours if needed 10 tablet 0  . rosuvastatin (CRESTOR) 20 MG tablet TAKE 1 TABLET BY MOUTH DAILY. 90 tablet 3  . Semaglutide,0.25 or 0.5MG/DOS, 2 MG/1.5ML SOPN INJECT 0.25 MG INTO THE SKIN EVERY 7 DAYS. 1.5 mL 0  . SYNTHROID 300 MCG tablet TAKE 1 TABLET BY MOUTH ONCE A DAY BEFORE BREAKFAST 90 tablet 1  . Vitamin D, Ergocalciferol, (DRISDOL) 1.25 MG (50000 UNIT) CAPS capsule TAKE 1 CAPSULE BY MOUTH EVERY 7 DAYS. 4 capsule 0   No current facility-administered medications for this visit.    Medication Side Effects: none  Orders placed this visit:  No orders of the defined types were placed in this encounter.   Psychiatric Specialty Exam:  Review of Systems  Musculoskeletal: Negative for gait problem.  Neurological: Negative for tremors.  Psychiatric/Behavioral:       Please refer to HPI    Blood pressure (!) 147/83, pulse 83, height _0  (1.778 m), weight (!) 345 lb (156.5 kg).Body mass index is 49.5 kg/m.  General Appearance: Casual and Neat  Eye Contact:  Good  Speech:  Clear and Coherent and Normal Rate  Volume:  Normal  Mood:  Anxious, Depressed and Irritable  Affect:  Appropriate and  Congruent  Thought Process:  Coherent and Descriptions of Associations: Intact  Orientation:  Full (Time, Place, and Person)  Thought Content: Logical   Suicidal Thoughts:  No  Homicidal Thoughts:  No  Memory:  WNL  Judgement:  Good  Insight:  Good  Psychomotor Activity:  Normal  Concentration:  Concentration: Good  Recall:  Good  Fund of Knowledge: Good  Language: Good  Assets:  Communication Skills Desire for Improvement Financial Resources/Insurance Housing Intimacy Leisure Time Physical Health Resilience Social Support Talents/Skills Transportation Vocational/Educational  ADL's:  Intact  Cognition: WNL  Prognosis:  Good   Screenings:  GAD-7   Flowsheet Row Office Visit from 03/01/2020 in [place]  Total GAD-7 Score 11    PHQ2-9   [place] Office Visit from 10/11/2020 in [place] Office Visit from 03/01/2020 in [facility] Visit from 06/16/2019 in [place] at [facility] Visit from 04/22/2019 in [place] at [facility] Total Score _1 0  PHQ-9 Total Score _2 2  [facility] 10/12/2020 in [facility] No Risk      Receiving Psychotherapy: Yes [facility]  Treatment Plan/Recommendations:   Plan:  PDMP reviewed  1. Lamictal 151m daily 2. Zoloft 1031m- 2 daily 3. Nuvigil 20047maily 4. Abilify 77m70mily 5. Clonazepam 1mg 70mXanax XR 0.5mg  74mEquetro 300mg B17mNo scripts needed today  RTC 4 weeks  Patient advised to contact office with any questions, adverse effects, or acute worsening in signs and symptoms.  Counseled patient regarding potential benefits, risks, and side effects of Lamictal to include potential risk of Stevens-Johnson syndrome. Advised patient to stop taking Lamictal and contact office immediately if rash develops  and to seek urgent medical attention if rash is severe and/or spreading quickly.   Discussed potential benefits, risk, and side effects of benzodiazepines to include potential risk of tolerance and dependence, as well as possible drowsiness.  Advised patient not to drive if experiencing drowsiness and to take lowest possible effective dose to minimize risk of dependence and tolerance.  Discussed potential metabolic side effects associated with atypical antipsychotics, as well as potential risk for movement side effects. Advised pt to contact office if movement side effects occur.      Tracy [name]

## 2020-11-09 DIAGNOSIS — K0500 Acute gingivitis, plaque induced: Secondary | ICD-10-CM | POA: Diagnosis not present

## 2020-11-09 DIAGNOSIS — K117 Disturbances of salivary secretion: Secondary | ICD-10-CM | POA: Diagnosis not present

## 2020-11-09 DIAGNOSIS — L438 Other lichen planus: Secondary | ICD-10-CM | POA: Diagnosis not present

## 2020-11-09 DIAGNOSIS — Z0000 Encounter for general adult medical examination without abnormal findings: Secondary | ICD-10-CM | POA: Diagnosis not present

## 2020-11-14 NOTE — Progress Notes (Signed)
TeleHealth Visit:  Due to the COVID-19 pandemic, this visit was completed with telemedicine (audio/video) technology to reduce patient and provider exposure as well as to preserve personal protective equipment.   Tracy [name] has verbally consented to this TeleHealth visit. The patient is located at home, the provider is located at the [facility] office. The participants in this visit include the listed provider and patient and. The visit was conducted today via [place].  OBESITY Tracy [name] is here to discuss her progress with her obesity treatment plan along with follow-up of her obesity related diagnoses.   Today's visit was #: 3 Starting weight: 336 lbs Starting date: 10/11/2020 Today's date: 11/08/2020  Interim History:  Tracy [name] is here for a follow up office visit and she is following the meal plan without concerns or issues.  Patient's meal and food recall appears to be accurate and consistent with what is on the plan.  When on plan, her hunger and cravings are well controlled.    Tracy [name] had some URI symptoms of unknown etiology.  Current Meal Plan: the Category 3 Plan for 80-90% of the time.  Current Exercise Plan: None. Current Anti-Obesity Medications: Ozempic 0.25 mg subcutaneously weekly. Side effects: None.  Assessment/Plan:   1. Type 2 diabetes mellitus with other specified complication, without long-term current use of insulin (HCC) Diabetes Mellitus: Not at goal. Medication: Ozempic 0.25 mg subcutaneously weekly, metformin 1,000 mg daily. Issues reviewed: blood sugar goals, complications of diabetes mellitus, hypoglycemia prevention and treatment, exercise, and nutrition.  Dr. Leafy Ro started her on Ozempic at last office visit.  No longer with "hunger pangs".  Hunger and cravings controlled.  Blood sugars 120s-200.  No lows.  Plan: The importance of regular follow up with PCP and all other specialists as scheduled was stressed to patient today.   Since she had a little loose stool and not certain if it was food she ate or what, we will continue the same dose for now.  We will consider increasing her dose at upcoming office visits.  Continue home blood sugar monitoring.   Lab Results  Component Value Date   HGBA1C 8.0 (H) 09/21/2020   HGBA1C 5.8 (H) 03/01/2020   HGBA1C 4.9 03/16/2019   Lab Results  Component Value Date   MICROALBUR 0.6 01/31/2017   LDLCALC 85 10/11/2020   CREATININE 0.79 10/11/2020   2. Vitamin D deficiency Not at goal. Current vitamin D is 14.0, tested on 10/11/2020. Optimal goal > 50 ng/dL.  She is taking vitamin D 50,000 IU weekly.  Tolerating vitamin D well without issues.  Started at last office visit, per patient.   Plan: Continue to take prescription Vitamin D @50 ,000 IU every week as prescribed.  Follow-up for routine testing of Vitamin D, at least 2-3 times per year to avoid over-replacement.  - Refill Vitamin D, Ergocalciferol, (DRISDOL) 1.25 MG (50000 UNIT) CAPS capsule; TAKE 1 CAPSULE BY MOUTH EVERY 7 DAYS.  Dispense: 4 capsule; Refill: 0  3. Obesity with current BMI 47.49  Course: Tracy [name] is currently in the action stage of change. As such, her goal is to continue with weight loss efforts.   Nutrition goals: She has agreed to the Category 3 Plan.   Exercise goals: All adults should avoid inactivity. Some physical activity is better than none, and adults who participate in any amount of physical activity gain some health benefits. Walk to the end of the driveway and back.  Behavioral modification strategies: increasing lean protein intake,  decreasing sodium intake, no skipping meals, better snacking choices and planning for success.  Tracy [name] has agreed to follow-up with our clinic in 2-3 weeks with Dr. Leafy Ro. She was informed of the importance of frequent follow-up visits to maximize her success with intensive lifestyle modifications for her multiple health conditions.   Objective:   General:  Cooperative, alert, well developed, in no acute distress. HEENT: Conjunctivae and lids unremarkable. Cardiovascular: Regular rhythm.  Lungs: Normal work of breathing. Neurologic: No focal deficits.   Lab Results  Component Value Date   CREATININE 0.79 10/11/2020   BUN 14 10/11/2020   NA 140 10/11/2020   K 4.2 10/11/2020   CL 102 10/11/2020   CO2 23 10/11/2020   Lab Results  Component Value Date   ALT 31 10/11/2020   AST 32 10/11/2020   ALKPHOS 126 (H) 10/11/2020   BILITOT <0.2 10/11/2020   Lab Results  Component Value Date   HGBA1C 8.0 (H) 09/21/2020   HGBA1C 5.8 (H) 03/01/2020   HGBA1C 4.9 03/16/2019   HGBA1C 5.1 03/04/2019   HGBA1C 5.1 10/08/2018   Lab Results  Component Value Date   INSULIN 77.2 (H) 10/11/2020   Lab Results  Component Value Date   TSH 10.600 (H) 10/11/2020   Lab Results  Component Value Date   CHOL 155 10/11/2020   HDL 44 10/11/2020   LDLCALC 85 10/11/2020   TRIG 152 (H) 10/11/2020   CHOLHDL 6.0 (H) 03/01/2020   Lab Results  Component Value Date   WBC 4.8 10/11/2020   HGB 10.9 (L) 10/11/2020   HCT 35.6 10/11/2020   MCV 78 (L) 10/11/2020   PLT 224 10/11/2020   Attestation Statements:   Reviewed by clinician on day of visit: allergies, medications, problem list, medical history, surgical history, family history, social history, and previous encounter notes.  I, Water quality scientist, CMA, am acting as Location manager for [employer], DO.  I have reviewed the above documentation for accuracy and completeness, and I agree with the above. Marjory Sneddon, D.O.  The [place] was signed into law in 2016 which includes the topic of electronic health records.  This provides immediate access to information in MyChart.  This includes consultation notes, operative notes, office notes, lab results and pathology reports.  If you have any questions about what you read please let us know at your next visit so we can discuss your concerns  and take corrective action if need be.  We are right here with you.

## 2020-11-15 ENCOUNTER — Telehealth (INDEPENDENT_AMBULATORY_CARE_PROVIDER_SITE_OTHER): Payer: Self-pay | Admitting: Psychology

## 2020-11-15 ENCOUNTER — Telehealth: Payer: Self-pay | Admitting: Adult Health

## 2020-11-15 DIAGNOSIS — F489 Nonpsychotic mental disorder, unspecified: Secondary | ICD-10-CM

## 2020-11-15 DIAGNOSIS — Z0389 Encounter for observation for other suspected diseases and conditions ruled out: Secondary | ICD-10-CM

## 2020-11-15 DIAGNOSIS — F3181 Bipolar II disorder: Secondary | ICD-10-CM | POA: Diagnosis not present

## 2020-11-15 NOTE — Telephone Encounter (Signed)
Phone call

## 2020-11-16 ENCOUNTER — Encounter (INDEPENDENT_AMBULATORY_CARE_PROVIDER_SITE_OTHER): Payer: Self-pay

## 2020-11-16 ENCOUNTER — Encounter (INDEPENDENT_AMBULATORY_CARE_PROVIDER_SITE_OTHER): Payer: 59 | Admitting: Psychology

## 2020-11-16 ENCOUNTER — Telehealth (INDEPENDENT_AMBULATORY_CARE_PROVIDER_SITE_OTHER): Payer: Self-pay | Admitting: Psychology

## 2020-11-16 NOTE — Telephone Encounter (Signed)
  Office: [phone]  /  Fax: [phone]  Date of Encounter: November 16, 2020  Time of Encounter: 2:28pm Duration of Encounter: ~4 minutes Provider: Glennie Isle, PsyD  CONTENT: Tracy [name] presented for today's follow-up appointment via [place] Visit; however, disclosed she was not feeling well. As such, she was receptive to rescheduling today's appointment. A brief check in and risk assessment was conducted. Tracy [name] reported the separation process has been "stressful," adding she was able to discuss the aforementioned with her primary therapist yesterday. Their next appointment is on November 29, 2020. She denied any other concerns. Tracy [name] also denied experiencing suicidal and homicidal ideation, plan, and intent since the last appointment with this provider.   PLAN: Tracy [name] is scheduled for an appointment on April 21, 20221 at 3pm via [place] Visit.

## 2020-11-16 NOTE — Progress Notes (Unsigned)
Office: [phone]  /  Fax: [phone]    Date: November 23, 2020   Appointment Start Time: *** Duration: *** minutes Provider: Glennie Isle, Psy.D. Type of Session: Individual Therapy  Location of Patient: {gbptloc:23249} Location of Provider: Provider's Home (private office) Type of Contact: Telepsychological Visit via MyChart Video Visit  Session Content: Tracy [name] is a 55 y.o. female presenting for a follow-up appointment to address the previously established treatment goal of increasing coping skills. Today's appointment was a telepsychological visit due to COVID-19. Tracy [name] provided verbal consent for today's telepsychological appointment and she is aware she is responsible for securing confidentiality on her end of the session. Prior to proceeding with today's appointment, Tracy [name]'s physical location at the time of this appointment was obtained as well a phone number she could be reached at in the event of technical difficulties. Tracy [name] and this provider participated in today's telepsychological service.   This provider conducted a brief check-in and verbally administered the PHQ-9 and GAD-7. *** Tracy [name] was receptive to today's appointment as evidenced by openness to sharing, responsiveness to feedback, and {gbreceptiveness:23401}.  Mental Status Examination:  Appearance: {Appearance:22431} Behavior: {Behavior:22445} Mood: {gbmood:21757} Affect: {Affect:22436} Speech: {Speech:22432} Eye Contact: {Eye Contact:22433} Psychomotor Activity: {Motor Activity:22434} Gait: {gbgait:23404} Thought Process: {thought process:22448}  Thought Content/Perception: {disturbances:22451} Orientation: {Orientation:22437} Memory/Concentration: {gbcognition:22449} Insight/Judgment: {Insight:22446}  Structured Assessments Results: The Patient Health Questionnaire-9 (PHQ-9) is a self-report measure that assesses symptoms and severity of depression over the course of the last two weeks. Tracy [name] obtained a  score of *** suggesting {GBPHQ9SEVERITY:21752}. Tracy [name] finds the endorsed symptoms to be {gbphq9difficulty:21754}. [0= Not at all; 1= Several days; 2= More than half the days; 3= Nearly every day] Little interest or pleasure in doing things ***  Feeling down, depressed, or hopeless ***  Trouble falling or staying asleep, or sleeping too much ***  Feeling tired or having little energy ***  Poor appetite or overeating ***  Feeling bad about yourself --- or that you are a failure or have let yourself or your family down ***  Trouble concentrating on things, such as reading the newspaper or watching television ***  Moving or speaking so slowly that other people could have noticed? Or the opposite --- being so fidgety or restless that you have been moving around a lot more than usual ***  Thoughts that you would be better off dead or hurting yourself in some way ***  PHQ-9 Score ***    The Generalized Anxiety Disorder-7 (GAD-7) is a brief self-report measure that assesses symptoms of anxiety over the course of the last two weeks. Tracy [name] obtained a score of *** suggesting {gbgad7severity:21753}. Tracy [name] finds the endorsed symptoms to be {gbphq9difficulty:21754}. 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 Tracy [name] has demonstrated progress  in her goal as evidenced by {gbtxprogress:22839}. Tracy [name] also {gbtxprogress2:22951}.  Plan: The next appointment will be scheduled  in {gbweeks:21758}, which will be {gbtxmodality:23402}. The next session will focus on {Plan for Next Appointment:23400}.

## 2020-11-16 NOTE — Progress Notes (Signed)
Entered in error

## 2020-11-20 NOTE — Telephone Encounter (Signed)
Pt informed

## 2020-11-20 NOTE — Telephone Encounter (Signed)
Noted  

## 2020-11-22 ENCOUNTER — Ambulatory Visit (INDEPENDENT_AMBULATORY_CARE_PROVIDER_SITE_OTHER): Payer: 59 | Admitting: Family Medicine

## 2020-11-22 ENCOUNTER — Encounter (INDEPENDENT_AMBULATORY_CARE_PROVIDER_SITE_OTHER): Payer: Self-pay | Admitting: Family Medicine

## 2020-11-22 ENCOUNTER — Encounter (INDEPENDENT_AMBULATORY_CARE_PROVIDER_SITE_OTHER): Payer: Self-pay

## 2020-11-23 ENCOUNTER — Telehealth (INDEPENDENT_AMBULATORY_CARE_PROVIDER_SITE_OTHER): Payer: 59 | Admitting: Psychology

## 2020-11-29 ENCOUNTER — Other Ambulatory Visit: Payer: Self-pay | Admitting: Adult Health

## 2020-11-29 ENCOUNTER — Other Ambulatory Visit (HOSPITAL_COMMUNITY): Payer: Self-pay

## 2020-11-29 DIAGNOSIS — F3181 Bipolar II disorder: Secondary | ICD-10-CM | POA: Diagnosis not present

## 2020-11-29 NOTE — Telephone Encounter (Signed)
Not sure why this was discontinued?

## 2020-11-30 ENCOUNTER — Telehealth: Payer: Self-pay | Admitting: Adult Health

## 2020-11-30 ENCOUNTER — Other Ambulatory Visit (HOSPITAL_COMMUNITY): Payer: Self-pay

## 2020-11-30 MED ORDER — SERTRALINE HCL 100 MG PO TABS
ORAL_TABLET | Freq: Every morning | ORAL | 0 refills | Status: DC
  Filled 2020-11-30: qty 60, 30d supply, fill #0

## 2020-11-30 NOTE — Telephone Encounter (Signed)
Tracy [name] is out until next week but please review

## 2020-11-30 NOTE — Telephone Encounter (Signed)
Patient Tracy [name] stating she is in between providers and is in need of her medication. She stated she was advised on yesterday of the policies and felt the person she spoke with had little compassion for her needs. Patient stated she has been without for 2 days and is now going through withdrawals. She would  like a return call from the office manager, and a call when her medication will be submitted. # [phone] [id number]

## 2020-11-30 NOTE — Telephone Encounter (Signed)
Called and spoke with patient.

## 2020-12-01 ENCOUNTER — Ambulatory Visit: Payer: 59 | Admitting: Family Medicine

## 2020-12-01 ENCOUNTER — Encounter: Payer: Self-pay | Admitting: Family Medicine

## 2020-12-01 ENCOUNTER — Other Ambulatory Visit: Payer: Self-pay

## 2020-12-01 ENCOUNTER — Other Ambulatory Visit (HOSPITAL_COMMUNITY): Payer: Self-pay

## 2020-12-01 VITALS — BP 128/80 | HR 81 | Ht 70.0 in | Wt 340.0 lb

## 2020-12-01 DIAGNOSIS — E1169 Type 2 diabetes mellitus with other specified complication: Secondary | ICD-10-CM

## 2020-12-01 DIAGNOSIS — E559 Vitamin D deficiency, unspecified: Secondary | ICD-10-CM | POA: Diagnosis not present

## 2020-12-01 DIAGNOSIS — E890 Postprocedural hypothyroidism: Secondary | ICD-10-CM | POA: Diagnosis not present

## 2020-12-01 MED ORDER — NYSTATIN 100000 UNIT/GM EX POWD
1.0000 "application " | Freq: Three times a day (TID) | CUTANEOUS | 3 refills | Status: DC
  Filled 2020-12-01: qty 60, 20d supply, fill #0
  Filled 2021-01-23: qty 60, 20d supply, fill #1
  Filled 2021-01-31 – 2021-05-02 (×2): qty 60, 20d supply, fill #2
  Filled 2021-08-29: qty 60, 20d supply, fill #3

## 2020-12-01 MED ORDER — VITAMIN D (ERGOCALCIFEROL) 1.25 MG (50000 UNIT) PO CAPS
ORAL_CAPSULE | ORAL | 1 refills | Status: DC
Start: 2020-12-01 — End: 2021-05-23
  Filled 2020-12-01: qty 12, 84d supply, fill #0
  Filled 2021-02-26: qty 12, 84d supply, fill #1

## 2020-12-01 MED ORDER — OZEMPIC (1 MG/DOSE) 4 MG/3ML ~~LOC~~ SOPN
1.0000 mg | PEN_INJECTOR | SUBCUTANEOUS | 3 refills | Status: DC
  Filled 2020-12-01: qty 3, 28d supply, fill #0

## 2020-12-01 NOTE — Patient Instructions (Signed)
Great to see you! Increase ozempic to 1mg  weekly.  Have labs completed.  See me again in 3-4 weeks

## 2020-12-02 ENCOUNTER — Other Ambulatory Visit (HOSPITAL_COMMUNITY): Payer: Self-pay

## 2020-12-02 LAB — T3: T3, Total: 108 ng/dL (ref 76–181)

## 2020-12-02 LAB — TSH: TSH: 5.89 mIU/L — ABNORMAL HIGH

## 2020-12-02 LAB — T4, FREE: Free T4: 1.1 ng/dL (ref 0.8–1.8)

## 2020-12-03 ENCOUNTER — Encounter: Payer: Self-pay | Admitting: Family Medicine

## 2020-12-03 NOTE — Assessment & Plan Note (Signed)
Lab Results  Component Value Date   HGBA1C 8.0 (H) 09/21/2020  Diabetes is not well controlled.  Continue to titrate Ozempic to 1mg  weekly.  Continue metformin.

## 2020-12-03 NOTE — Progress Notes (Signed)
Deandrea Vanpelt - 55 y.o. female MRN [id number]  Date of birth: Mar 17, 1966  Subjective Chief Complaint  Patient presents with  . Diabetes    HPI Tracy [name] is a 55 y.o. female here today for follow up of diabetes.  Reports that she has been under more stress recently.  Signed papers for separation from her husband yesterday which has been a major source of stress for her.  She has been eating more and continues to have difficulty losing weight.  She feels like her depression is worsening.  She continues to work with her psychiatrist and therapist regarding this but has been diagnosed with treatement resistant depression.  She has had ECT and TMS in the past.   Blood sugars have been more elevated recently.  She is taking metformin and ozempic at 0.5mg  weekly.  ROS:  A comprehensive ROS was completed and negative except as noted per HPI  Allergies  Allergen Reactions  . Sulfa Antibiotics Itching, Swelling and Rash  . Bupropion Hcl Itching and Rash  . Codeine Itching  . Sulfamethoxazole-Trimethoprim Rash    Past Medical History:  Diagnosis Date  . Anxiety   . Arthritis    back, knees, right elbow  . Back pain   . Bilateral swelling of feet and ankles   . Bipolar disorder ([place])   . Borderline personality disorder ([place])   . Cancer (HCC)    oral  . Chewing difficulty   . Cirrhosis ([place])   . Constipation   . Dental crowns present   . Depression   . Diabetes mellitus without complication ([place])   . Fatty liver   . Food allergy    Walnuts  . GAD (generalized anxiety disorder)   . GERD (gastroesophageal reflux disease)   . High cholesterol    no current med.  Marland Kitchen History of kidney stones   . History of migraine headaches   . Hypertension    under control with meds., has been on med. x 4 yr.  . Hypothyroidism   . IBS (irritable bowel syndrome)   . Infertility, female   . Joint pain   . Lichen planus   . Liver cirrhosis secondary to NASH (nonalcoholic  steatohepatitis) ([place])   . NASH (nonalcoholic steatohepatitis)   . Osteoarthritis   . Other fatigue   . Palpitations   . PCOS (polycystic ovarian syndrome)   . PONV (postoperative nausea and vomiting)    also hx. of emergence delirium 2007  . Shortness of breath on exertion   . Sleep apnea    uses CPAP nightly  . Trigeminal neuralgia   . Type 2 diabetes mellitus ([place])   . Vitamin D deficiency     Past Surgical History:  Procedure Laterality Date  . ACHILLES TENDON SURGERY Left 2007  . APPENDECTOMY    . CARPAL TUNNEL RELEASE Right 01/12/2016   Procedure: RIGHT CARPAL TUNNEL RELEASE;  Surgeon: Roseanne Kaufman, MD;  Location: [facility];  Service: Orthopedics;  Laterality: Right;  . COLONOSCOPY WITH PROPOFOL N/A 04/20/2014   Procedure: COLONOSCOPY WITH PROPOFOL;  Surgeon: Arta Silence, MD;  Location: [facility];  Service: Endoscopy;  Laterality: N/A;  . ESOPHAGOGASTRODUODENOSCOPY (EGD) WITH PROPOFOL N/A 04/20/2014   Procedure: ESOPHAGOGASTRODUODENOSCOPY (EGD) WITH PROPOFOL;  Surgeon: Arta Silence, MD;  Location: [facility];  Service: Endoscopy;  Laterality: N/A;  . EXCISION HAGLUND'S DEFORMITY WITH ACHILLES TENDON REPAIR Right 02/25/2013   Procedure: RIGHT ACHILLES DEBRIDEMENT AND RECONSTRUCTION;  HAGLUND'S EXCISION; GASTROC RECESSION AND FLEXOR HALLUCIS LONGUS  TRANSFER;  Surgeon: Wylene Simmer, MD;  Location: [facility];  Service: Orthopedics;  Laterality: Right;  . JOINT REPLACEMENT     hip  . LUMBAR LAMINECTOMY     X 3  . neck fusion    . OVARIAN CYST SURGERY Left 1998  . PILONIDAL CYST EXCISION  1994  . RIGHT OOPHORECTOMY    . STERIOD INJECTION Left 01/12/2016   Procedure: STEROID INJECTION LEFT WRIST;  Surgeon: Roseanne Kaufman, MD;  Location: [facility];  Service: Orthopedics;  Laterality: Left;  . TOTAL HIP ARTHROPLASTY Left 03/19/2019   Procedure: LEFT TOTAL HIP ARTHROPLASTY ANTERIOR APPROACH;  Surgeon: Mcarthur Rossetti, MD;   Location: [facility];  Service: Orthopedics;  Laterality: Left;  . TOTAL THYROIDECTOMY  2003  . UPPER GI ENDOSCOPY  01/19/2015    Social History   Socioeconomic History  . Marital status: Legally Separated    Spouse name: Not on file  . Number of children: 0  . Years of education: 63  . Highest education level: Not on file  Occupational History  . 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 Smoking status: Former Smoker    Packs/day: 0.50    Years: 15.00    Pack years: 7.50    Types: Cigarettes    Quit date: 02/2019    Years since quitting: 1.8  . Smokeless tobacco: Never Used  Vaping Use  . Vaping Use: Never used  Substance and Sexual Activity  . Alcohol use: No    Alcohol/week: 0.0 standard drinks    Comment: Has not had any alcohol since 07/2014 - before this date, she rarely drink.  . Drug use: No  . Sexual activity: Not Currently    Partners: Male    Birth control/protection: Condom  Other Topics Concern  . Not on file  Social History Narrative   RN - [employer]   Regular exercise: no   Caffeine use: 2 x daily   Right-handed.   Lives alone.   Social Determinants of Health   Financial Resource Strain: Not on file  Food Insecurity: Not on file  Transportation Needs: Not on file  Physical Activity: Not on file  Stress: Not on file  Social Connections: Not on file    Family History  Problem Relation Age of Onset  . Heart disease Father   . Depression Father   . Heart attack Father   . Sudden death Father   . Anxiety disorder Father   . Bipolar disorder Father   . Obesity Father   . Cancer Brother        THROAT colon  . Depression Brother   . Anxiety disorder Brother   . Depression Sister   . Breast cancer Mother   . Hypertension Mother   . Cancer Mother   . Depression Mother   . Sleep apnea Mother   . Obesity Mother   . Depression Maternal Grandmother   . Stroke  Maternal Grandfather     Health Maintenance  Topic Date Due  . Hepatitis C Screening  Never done  . FOOT EXAM  Never done  . HIV Screening  Never done  . PAP SMEAR-Modifier  Never done  . MAMMOGRAM  10/28/2017  . OPHTHALMOLOGY EXAM  03/28/2020  . INFLUENZA VACCINE  03/05/2021  . HEMOGLOBIN A1C  03/21/2021  . COLONOSCOPY (Pts 45-61yrs Insurance coverage will need to be confirmed)  04/20/2024  . TETANUS/TDAP  01/03/2028  . PNEUMOCOCCAL POLYSACCHARIDE VACCINE AGE 45-64 HIGH RISK  Completed  . COVID-19 Vaccine  Completed  . HPV VACCINES  Aged Out     ----------------------------------------------------------------------------------------------------------------------------------------------------------------------------------------------------------------- Physical Exam BP 128/80 (BP Location: Left Arm, Patient Position: Sitting, Cuff Size: Large)   Pulse 81   Ht 5\' 10"  (1.778 m)   Wt (!) 340 lb (154.2 kg)   SpO2 95%   BMI 48.78 kg/m   Physical Exam Constitutional:      Appearance: Normal appearance.  Neurological:     Mental Status: She is alert.  Psychiatric:     Comments: Tearful today.      ------------------------------------------------------------------------------------------------------------------------------------------------------------------------------------------------------------------- Assessment and Plan  HYPOTHYROIDISM, POSTSURGICAL Continues to have edema and difficult to treat depression.  Update TSH, T4 and T3.  Can consider addition of low dose liothyronine to help with treatment resistant depression.   Diabetes mellitus, type 2 Lab Results  Component Value Date   HGBA1C 8.0 (H) 09/21/2020  Diabetes is not well controlled.  Continue to titrate Ozempic to 1mg  weekly.  Continue metformin.   Vitamin D deficiency Vitamin D rx renewed.     Meds ordered this encounter  Medications  . Vitamin D, Ergocalciferol, (DRISDOL) 1.25 MG (50000 UNIT)  CAPS capsule    Sig: TAKE 1 CAPSULE BY MOUTH EVERY 7 DAYS.    Dispense:  12 capsule    Refill:  1  . nystatin (MYCOSTATIN/NYSTOP) powder    Sig: Apply 3 times daily as directed.    Dispense:  60 g    Refill:  3  . Semaglutide, 1 MG/DOSE, (OZEMPIC, 1 MG/DOSE,) 4 MG/3ML SOPN    Sig: Inject 1 mg into the skin once a week.    Dispense:  3 mL    Refill:  3    Return in about 4 weeks (around 12/29/2020) for DM.    This visit occurred during the SARS-CoV-2 public health emergency.  Safety protocols were in place, including screening questions prior to the visit, additional usage of staff PPE, and extensive cleaning of exam room while observing appropriate contact time as indicated for disinfecting solutions.

## 2020-12-03 NOTE — Assessment & Plan Note (Signed)
Vitamin D rx renewed.

## 2020-12-03 NOTE — Assessment & Plan Note (Signed)
Continues to have edema and difficult to treat depression.  Update TSH, T4 and T3.  Can consider addition of low dose liothyronine to help with treatment resistant depression.

## 2020-12-04 ENCOUNTER — Other Ambulatory Visit: Payer: Self-pay | Admitting: Family Medicine

## 2020-12-04 ENCOUNTER — Other Ambulatory Visit (HOSPITAL_COMMUNITY): Payer: Self-pay

## 2020-12-04 DIAGNOSIS — E890 Postprocedural hypothyroidism: Secondary | ICD-10-CM

## 2020-12-04 MED ORDER — LIOTHYRONINE SODIUM 25 MCG PO TABS
25.0000 ug | ORAL_TABLET | Freq: Every day | ORAL | 1 refills | Status: DC
  Filled 2020-12-04: qty 30, 30d supply, fill #0

## 2020-12-05 ENCOUNTER — Other Ambulatory Visit (HOSPITAL_COMMUNITY): Payer: Self-pay

## 2020-12-05 ENCOUNTER — Encounter: Payer: Self-pay | Admitting: Family Medicine

## 2020-12-06 ENCOUNTER — Encounter: Payer: Self-pay | Admitting: Adult Health

## 2020-12-06 ENCOUNTER — Ambulatory Visit (INDEPENDENT_AMBULATORY_CARE_PROVIDER_SITE_OTHER): Payer: 59 | Admitting: Adult Health

## 2020-12-06 ENCOUNTER — Other Ambulatory Visit: Payer: Self-pay

## 2020-12-06 DIAGNOSIS — F4310 Post-traumatic stress disorder, unspecified: Secondary | ICD-10-CM | POA: Diagnosis not present

## 2020-12-06 DIAGNOSIS — G4733 Obstructive sleep apnea (adult) (pediatric): Secondary | ICD-10-CM | POA: Diagnosis not present

## 2020-12-06 DIAGNOSIS — F410 Panic disorder [episodic paroxysmal anxiety] without agoraphobia: Secondary | ICD-10-CM

## 2020-12-06 DIAGNOSIS — F411 Generalized anxiety disorder: Secondary | ICD-10-CM | POA: Diagnosis not present

## 2020-12-06 DIAGNOSIS — F3181 Bipolar II disorder: Secondary | ICD-10-CM

## 2020-12-06 NOTE — Progress Notes (Signed)
Tracy [name] [id number] 03/03/1966 55 y.o.  Subjective:   Patient ID:  Tracy [name] is a 55 y.o. (DOB [birth year]) female.  Chief Complaint: No chief complaint on file.   HPI   Referred by [facility]  Tracy [name] presents to the office today for follow-up of MDD, GAD, PTSD, and Panic attacks.  Describes mood today as "about the sane". Pleasant. Tearful throughout interview. Mood symptoms - reports ongoing depression without any relief. Feels anxious - always there with varying intensity. Denies irritabilty. Has tried multiple medications without any long term relief of symptoms. Has also participated in [place] x 2 and ECT. Felt like [place] was helpful initially and tried it a second time without further success. Does not feel current medications are offering symptom relief. Stating "I need help". Also stating "I have tried everything I know to do, and it hasn't helped. Is willing to consider Spravato. Decreased interest and motivation. Taking medications as prescribed.  Energy levels lower. Active, does not have a regular exercise routine.  Enjoys some usual interests and activities. Married - plans to divore husband of 65 to 20 years. Siblings locally. Spending time with family. Appetite adequate. Weight loss. Working with [employer].  Sleeps well most nights. Averages 3 hours. Sleep apnea - Nuvigil in the morning.  Focus and concentration difficulties. Completing tasks. Managing aspects of household. 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 Denies SI or HI.  Denies AH or VH.   Previous medication trials:  Antipsychotics: Vraylar-TD, Latuda, Abilify, Geodon, Rexulti  Mood Stabilizers - Depakote, Lithium, Lamictal, Equetro, Topamax, Trileptal, Gabapentin  SSRI - Zoloft, Lexapro, Celexa, Prozac, Viibryd, Trintellix  SNRI - Pristiq, Effexor, Cymbalta  Wellbutrin - allergic  Anti-anxiety - Buspar, Xanax,  Clonazepam, Ativan, Valium  Sleep agents - Trazadone, Ambien, Restoril   Other - Deplin,  Emsam, Serzone, Doxepin, Nuvigil, Vistaril  Previous treatment: ECT and [place] Office Visit from 03/01/2020 in [place]  Total GAD-7 Score 11    PHQ2-9   Many Office Visit from 10/11/2020 in [place] Office Visit from 03/01/2020 in [place] Office Visit from 06/16/2019 in [employer] at [facility] Visit from 04/22/2019 in [place] at [facility] Total Score _0 0  PHQ-9 Total Score _1 Flowsheet Row ED from 10/12/2020 in [facility] [place] No Risk       Review of Systems:  Review of Systems  Musculoskeletal: Negative for gait problem.  Neurological: Negative for tremors.  Psychiatric/Behavioral:       Please refer to HPI    Medications: I have reviewed the patient's current medications.  Current Outpatient Medications  Medication Sig Dispense Refill  . ALPRAZolam (XANAX XR) 0.5 MG 24 hr tablet TAKE 1 TABLET BY MOUTH 2 TIMES DAILY 180 tablet 0  . ARIPiprazole (ABILIFY) 10 MG tablet Take 10 mg by mouth at bedtime.    . Blood Glucose Monitoring Suppl (FREESTYLE LITE) w/Device KIT USE TO CHECK BLOOD SUGAR ONCE A DAY 1 kit 0  . Blood Glucose Monitoring Suppl (FREESTYLE LITE) w/Device KIT USE TO CHECK BLOOD SUGAR ONCE A DAY 1 kit 0  . Carbamazepine (EQUETRO) 300 MG CP12 Take 2 capsules by mouth at bedtime. Take 2 capsules  by mouth daily at bedtime.    . clobetasol (TEMOVATE) 0.05 % GEL APPLY TOPICALLY 2 (TWO) TIMES DAILY DRY AREAS FIRST AND THEN APPLY IN THIN LAYER. BE STILL (NO DRINKING, EATING, OR SPEAKING FOR 20 MINUTES 60 g 1  . clonazePAM (KLONOPIN) 1 MG tablet Take 1 mg by mouth 3 (three) times daily.   5  . cyclobenzaprine (FLEXERIL) 10 MG tablet TAKE 1 TABLET BY  MOUTH 2 TIMES DAILY AS NEEDED FOR MUSCLE SPASMS 20 tablet 0  . furosemide (LASIX) 40 MG tablet TAKE 1 TABLET BY MOUTH DAILY. 30 tablet 3  . glucose blood test strip USE TO CHECK BLOOD SUGAR ONCE A DAY (Patient taking differently: USE TO CHECK BLOOD SUGAR ONCE A DAY) 100 strip 12  . hydrOXYzine (ATARAX/VISTARIL) 25 MG tablet Take 1 tablet (25 mg total) by mouth 3 (three) times daily as needed. 90 tablet 2  . ketorolac (TORADOL) 10 MG tablet TAKE 1 TABLET BY MOUTH EVERY 6 HOURS AS NEEDED 20 tablet 0  . Lancets (FREESTYLE) lancets USE TO TEST BLOOD SUGAR ONCE A DAY (Patient taking differently: USE TO TEST BLOOD SUGAR ONCE A DAY) 100 each 0  . lidocaine (XYLOCAINE) 5 % ointment APPLY 1 G TOPICALLY 3 (THREE) TIMES DAILY BEFORE MEALS (DOESNT COME IN GEL, FAXED DR) 1063.2 g 2  . liothyronine (CYTOMEL) 25 MCG tablet Take 1 tablet (25 mcg total) by mouth daily. 30 tablet 1  . lisinopril-hydrochlorothiazide (ZESTORETIC) 10-12.5 MG tablet TAKE 1 TABLET BY MOUTH ONCE DAILY 90 tablet 1  . metFORMIN (GLUCOPHAGE-XR) 500 MG 24 hr tablet TAKE 2 TABLETS BY MOUTH ONCE A DAY WITH BREAKFAST 180 tablet 1  . NUVIGIL 200 MG TABS Take 1 tablet by mouth in the morning.    . nystatin (MYCOSTATIN/NYSTOP) powder Apply 3 times daily as directed. 60 g 3  . ondansetron (ZOFRAN-ODT) 4 MG disintegrating tablet 4 mg 2 (two) times daily as needed.    . pimecrolimus (ELIDEL) 1 % cream APPLY TOPICALLY 2 TIMES DAILY TO COMPLETELY DRY AREA PLACE A THIN LAYER ONTO THE MOST SORE AREAS PRIOR TO BED. DO NOT RINSE 60 g 0  . RABEprazole (ACIPHEX) 20 MG tablet TAKE 1 TABLET BY MOUTH DAILY 90 tablet 1  . rizatriptan (MAXALT) 10 MG tablet Take 1 tablet (10 mg total) by mouth as needed for migraine. May repeat in 2 hours if needed 10 tablet 0  . rosuvastatin (CRESTOR) 20 MG tablet TAKE 1 TABLET BY MOUTH DAILY. 90 tablet 3  . Semaglutide, 1 MG/DOSE, (OZEMPIC, 1 MG/DOSE,) 4 MG/3ML SOPN Inject 1 mg into the skin once a week. 3 mL 3  . sertraline  (ZOLOFT) 100 MG tablet TAKE 2 TABLETS BY MOUTH EVERY MORNING 60 tablet 0  . SYNTHROID 300 MCG tablet TAKE 1 TABLET BY MOUTH ONCE A DAY BEFORE BREAKFAST 90 tablet 1  . Vitamin D, Ergocalciferol, (DRISDOL) 1.25 MG (50000 UNIT) CAPS capsule TAKE 1 CAPSULE BY MOUTH EVERY 7 DAYS. 12 capsule 1   No current facility-administered medications for this visit.    Medication Side Effects: None  Allergies:  Allergies  Allergen Reactions  . Sulfa Antibiotics Itching, Swelling and Rash  . Bupropion Hcl Itching and Rash  . Codeine Itching  . Sulfamethoxazole-Trimethoprim Rash    Past Medical History:  Diagnosis Date  . Anxiety   . Arthritis    back, knees, right elbow  . Back pain   . Bilateral swelling of feet and ankles   . Bipolar disorder ([place])   .  Borderline personality disorder ([employer])   . Cancer (HCC)    oral  . Chewing difficulty   . Cirrhosis ([place])   . Constipation   . Dental crowns present   . Depression   . Diabetes mellitus without complication ([place])   . Fatty liver   . Food allergy    Walnuts  . GAD (generalized anxiety disorder)   . GERD (gastroesophageal reflux disease)   . High cholesterol    no current med.  Marland Kitchen History of kidney stones   . History of migraine headaches   . Hypertension    under control with meds., has been on med. x 4 yr.  . Hypothyroidism   . IBS (irritable bowel syndrome)   . Infertility, female   . Joint pain   . Lichen planus   . Liver cirrhosis secondary to NASH (nonalcoholic steatohepatitis) ([place])   . NASH (nonalcoholic steatohepatitis)   . Osteoarthritis   . Other fatigue   . Palpitations   . PCOS (polycystic ovarian syndrome)   . PONV (postoperative nausea and vomiting)    also hx. of emergence delirium 2007  . Shortness of breath on exertion   . Sleep apnea    uses CPAP nightly  . Trigeminal neuralgia   . Type 2 diabetes mellitus ([place])   . Vitamin D deficiency     Past Medical History, Surgical history, Social history, and  Family history were reviewed and updated as appropriate.   Please see review of systems for further details on the patient's review from today.   Objective:   Physical Exam:  There were no vitals taken for this visit.  Physical Exam Constitutional:      General: She is not in acute distress. Musculoskeletal:        General: No deformity.  Neurological:     Mental Status: She is alert and oriented to person, place, and time.     Coordination: Coordination normal.  Psychiatric:        Attention and Perception: Attention and perception normal. She does not perceive auditory or visual hallucinations.        Mood and Affect: Mood is anxious and depressed. Affect is flat and tearful. Affect is not labile, blunt, angry or inappropriate.        Speech: Speech normal.        Behavior: Behavior is withdrawn.        Thought Content: Thought content normal. Thought content is not paranoid or delusional. Thought content does not include homicidal or suicidal ideation. Thought content does not include homicidal or suicidal plan.        Cognition and Memory: Cognition and memory normal.        Judgment: Judgment normal.     Comments: Insight intact     Lab Review:     Component Value Date/Time   NA 140 10/11/2020 1041   K 4.2 10/11/2020 1041   CL 102 10/11/2020 1041   CO2 23 10/11/2020 1041   GLUCOSE 166 (H) 10/11/2020 1041   GLUCOSE 132 10/06/2020 1344   BUN 14 10/11/2020 1041   CREATININE 0.79 10/11/2020 1041   CREATININE 0.79 10/06/2020 1344   CALCIUM 9.2 10/11/2020 1041   PROT 7.1 10/11/2020 1041   ALBUMIN 4.2 10/11/2020 1041   AST 32 10/11/2020 1041   ALT 31 10/11/2020 1041   ALKPHOS 126 (H) 10/11/2020 1041   BILITOT <0.2 10/11/2020 1041   GFRNONAA 86 10/06/2020 1344   GFRAA 100 10/06/2020 1344  Component Value Date/Time   WBC 4.8 10/11/2020 1041   WBC 6.5 03/01/2020 1153   RBC 4.55 10/11/2020 1041   RBC 5.27 (H) 03/01/2020 1153   HGB 10.9 (L) 10/11/2020 1041    HCT 35.6 10/11/2020 1041   PLT 224 10/11/2020 1041   MCV 78 (L) 10/11/2020 1041   MCH 24.0 (L) 10/11/2020 1041   MCH 24.1 (L) 03/01/2020 1153   MCHC 30.6 (L) 10/11/2020 1041   MCHC 31.0 (L) 03/01/2020 1153   RDW 17.5 (H) 10/11/2020 1041   LYMPHSABS 1.7 10/11/2020 1041   MONOABS 0.3 09/08/2019 1032   EOSABS 0.2 10/11/2020 1041   BASOSABS 0.0 10/11/2020 1041    No results found for: POCLITH, LITHIUM   No results found for: PHENYTOIN, PHENOBARB, VALPROATE, CBMZ   .res Assessment: Plan:    Plan:  PDMP reviewed  1. Lamictal 231m daily 2. Zoloft 1021m- 2 daily 3. Nuvigil 20057maily 4. Abilify 8m38mily 5. Clonazepam 1mg 11mXanax XR 0.5mg  22mEquetro 300mg B24m. Added Hydroxyzine 25mg TI56musig 1 tablet at bedtime.   No scripts needed today  Cathy Showfety  RTC 4 weeks  Discussed Spravato as a treatment option - patient given literature and will review. Met with nurse to discuss. Patient willing to met to discuss further and is open to all options.  Patient advised to contact office with any questions, adverse effects, or acute worsening in signs and symptoms.  Counseled patient regarding potential benefits, risks, and side effects of Lamictal to include potential risk of Stevens-Johnson syndrome. Advised patient to stop taking Lamictal and contact office immediately if rash develops and to seek urgent medical attention if rash is severe and/or spreading quickly.   Discussed potential benefits, risk, and side effects of benzodiazepines to include potential risk of tolerance and dependence, as well as possible drowsiness.  Advised patient not to drive if experiencing drowsiness and to take lowest possible effective dose to minimize risk of dependence and tolerance.  Discussed potential metabolic side effects associated with atypical antipsychotics, as well as potential risk for movement side effects. Advised pt to contact office if movement side effects occur.        Diagnoses and all orders for this visit:  OSA (obstructive sleep apnea)  GAD (generalized anxiety disorder)  Panic attacks  Bipolar II disorder (HCC)  PTSD (post-traumatic stress disorder)     Please see After Visit Summary for patient specific instructions.  Future Appointments  Date Time Provider [email or web address]  3:20 PM Kekai Geter NBerdie OgrenCP None  12/29/2020 11:10 AM MatthewsLuetta [facility] None    No orders of the defined types were placed in this encounter.   -------------------------------

## 2020-12-12 ENCOUNTER — Other Ambulatory Visit: Payer: Self-pay | Admitting: *Deleted

## 2020-12-14 ENCOUNTER — Ambulatory Visit: Payer: 59 | Admitting: Family Medicine

## 2020-12-14 ENCOUNTER — Encounter: Payer: Self-pay | Admitting: Family Medicine

## 2020-12-14 ENCOUNTER — Other Ambulatory Visit (HOSPITAL_COMMUNITY): Payer: Self-pay

## 2020-12-14 ENCOUNTER — Other Ambulatory Visit: Payer: Self-pay

## 2020-12-14 VITALS — BP 147/83 | HR 94 | Temp 97.6°F | Ht 70.0 in | Wt 331.1 lb

## 2020-12-14 DIAGNOSIS — Z1211 Encounter for screening for malignant neoplasm of colon: Secondary | ICD-10-CM | POA: Diagnosis not present

## 2020-12-14 DIAGNOSIS — K219 Gastro-esophageal reflux disease without esophagitis: Secondary | ICD-10-CM

## 2020-12-14 DIAGNOSIS — K7581 Nonalcoholic steatohepatitis (NASH): Secondary | ICD-10-CM

## 2020-12-14 DIAGNOSIS — R1013 Epigastric pain: Secondary | ICD-10-CM

## 2020-12-14 HISTORY — DX: Epigastric pain: R10.13

## 2020-12-14 MED ORDER — RABEPRAZOLE SODIUM 20 MG PO TBEC
20.0000 mg | DELAYED_RELEASE_TABLET | Freq: Two times a day (BID) | ORAL | 1 refills | Status: DC
  Filled 2020-12-14: qty 180, 90d supply, fill #0

## 2020-12-14 MED ORDER — SUCRALFATE 1 G PO TABS
1.0000 g | ORAL_TABLET | Freq: Three times a day (TID) | ORAL | 1 refills | Status: DC
  Filled 2020-12-14 – 2020-12-22 (×2): qty 60, 15d supply, fill #0

## 2020-12-14 NOTE — Assessment & Plan Note (Signed)
Increase aciphex to bid dosing.  Can add carafate if not improving over the next few days.  Referral to GI for consideration of endoscopy and updated colonoscopy.

## 2020-12-14 NOTE — Patient Instructions (Signed)
Increase aciphex to twice daily. If not improving after a few days add carafate.  Referral placed to GI Don't forget thyroid labs in a few weeks.

## 2020-12-14 NOTE — Progress Notes (Signed)
Tracy [name] - 55 y.o. female MRN [id number]  Date of birth: 02-16-1966  Subjective Chief Complaint  Patient presents with  . Abdominal Pain    HPI Tracy [name] is a 55 y.o. female here today with complaint of epigastric pain.  She has had burning sensation in her chest and scratchy throat.  She has had this with GERD in the past.  She is currently taking aciphex.  She has tried other PPI's in the past including pantoprazole, omeprazole and esomeprazole. She has had some loose bowel movement as well which she thinks may be from [place]. She denies nausea or vomiting.  She is due for updated colonoscopy and would like to have endoscopy as well.   ROS:  A comprehensive ROS was completed and negative except as noted per HPI  Allergies  Allergen Reactions  . Sulfa Antibiotics Itching, Swelling and Rash  . Bupropion Hcl Itching and Rash  . Codeine Itching  . Sulfamethoxazole-Trimethoprim Rash    Past Medical History:  Diagnosis Date  . Anxiety   . Arthritis    back, knees, right elbow  . Back pain   . Bilateral swelling of feet and ankles   . Bipolar disorder ([place])   . Borderline personality disorder ([place])   . Cancer (HCC)    oral  . Chewing difficulty   . Cirrhosis ([place])   . Constipation   . Dental crowns present   . Depression   . Diabetes mellitus without complication ([place])   . Fatty liver   . Food allergy    Walnuts  . GAD (generalized anxiety disorder)   . GERD (gastroesophageal reflux disease)   . High cholesterol    no current med.  Marland Kitchen History of kidney stones   . History of migraine headaches   . Hypertension    under control with meds., has been on med. x 4 yr.  . Hypothyroidism   . IBS (irritable bowel syndrome)   . Infertility, female   . Joint pain   . Lichen planus   . Liver cirrhosis secondary to NASH (nonalcoholic steatohepatitis) ([place])   . NASH (nonalcoholic steatohepatitis)   . Osteoarthritis   . Other fatigue   .  Palpitations   . PCOS (polycystic ovarian syndrome)   . PONV (postoperative nausea and vomiting)    also hx. of emergence delirium 2007  . Shortness of breath on exertion   . Sleep apnea    uses CPAP nightly  . Trigeminal neuralgia   . Type 2 diabetes mellitus ([place])   . Vitamin D deficiency     Past Surgical History:  Procedure Laterality Date  . ACHILLES TENDON SURGERY Left 2007  . APPENDECTOMY    . CARPAL TUNNEL RELEASE Right 01/12/2016   Procedure: RIGHT CARPAL TUNNEL RELEASE;  Surgeon: Roseanne Kaufman, MD;  Location: [facility];  Service: Orthopedics;  Laterality: Right;  . COLONOSCOPY WITH PROPOFOL N/A 04/20/2014   Procedure: COLONOSCOPY WITH PROPOFOL;  Surgeon: Arta Silence, MD;  Location: [facility];  Service: Endoscopy;  Laterality: N/A;  . ESOPHAGOGASTRODUODENOSCOPY (EGD) WITH PROPOFOL N/A 04/20/2014   Procedure: ESOPHAGOGASTRODUODENOSCOPY (EGD) WITH PROPOFOL;  Surgeon: Arta Silence, MD;  [facility];  Service: Endoscopy;  Laterality: N/A;  . EXCISION HAGLUND'S DEFORMITY WITH ACHILLES TENDON REPAIR Right 02/25/2013   Procedure: RIGHT ACHILLES DEBRIDEMENT AND RECONSTRUCTION;  HAGLUND'S EXCISION; GASTROC RECESSION AND FLEXOR HALLUCIS LONGUS TRANSFER;  Surgeon: Wylene Simmer, MD;  Location: [facility];  Service: Orthopedics;  Laterality: Right;  .  JOINT REPLACEMENT     hip  . LUMBAR LAMINECTOMY     X 3  . neck fusion    . OVARIAN CYST SURGERY Left 1998  . PILONIDAL CYST EXCISION  1994  . RIGHT OOPHORECTOMY    . STERIOD INJECTION Left 01/12/2016   Procedure: STEROID INJECTION LEFT WRIST;  Surgeon: Roseanne Kaufman, MD;  Location: [facility];  Service: Orthopedics;  Laterality: Left;  . TOTAL HIP ARTHROPLASTY Left 03/19/2019   Procedure: LEFT TOTAL HIP ARTHROPLASTY ANTERIOR APPROACH;  Surgeon: Mcarthur Rossetti, MD;  [facility];  Service: Orthopedics;  Laterality: Left;  . TOTAL THYROIDECTOMY  2003  . UPPER GI  ENDOSCOPY  01/19/2015    Social History   Socioeconomic History  . Marital status: Legally Separated    Spouse name: Not on file  . Number of children: 0  . Years of education: 61  . Highest education level: Not on file  Occupational History  . 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 Smoking status: Former Smoker    Packs/day: 0.50    Years: 15.00    Pack years: 7.50    Types: Cigarettes    Quit date: 02/2019    Years since quitting: 1.8  . Smokeless tobacco: Never Used  Vaping Use  . Vaping Use: Never used  Substance and Sexual Activity  . Alcohol use: No    Alcohol/week: 0.0 standard drinks    Comment: Has not had any alcohol since 07/2014 - before this date, she rarely drink.  . Drug use: No  . Sexual activity: Not Currently    Partners: Male    Birth control/protection: Condom  Other Topics Concern  . Not on file  Social History Narrative   RN - [employer]   Regular exercise: no   Caffeine use: 2 x daily   Right-handed.   Lives alone.   Social Determinants of Health   Financial Resource Strain: Not on file  Food Insecurity: Not on file  Transportation Needs: Not on file  Physical Activity: Not on file  Stress: Not on file  Social Connections: Not on file    Family History  Problem Relation Age of Onset  . Heart disease Father   . Depression Father   . Heart attack Father   . Sudden death Father   . Anxiety disorder Father   . Bipolar disorder Father   . Obesity Father   . Cancer Brother        THROAT colon  . Depression Brother   . Anxiety disorder Brother   . Depression Sister   . Breast cancer Mother   . Hypertension Mother   . Cancer Mother   . Depression Mother   . Sleep apnea Mother   . Obesity Mother   . Depression Maternal Grandmother   . Stroke Maternal Grandfather     Health Maintenance  Topic Date Due  . FOOT EXAM  Never done  . HIV  Screening  Never done  . Hepatitis C Screening  Never done  . PAP SMEAR-Modifier  Never done  . MAMMOGRAM  10/28/2017  . OPHTHALMOLOGY EXAM  03/28/2020  . INFLUENZA VACCINE  03/05/2021  . HEMOGLOBIN A1C  03/21/2021  . COLONOSCOPY (Pts 45-57yrs Insurance coverage will need to be confirmed)  04/20/2024  . TETANUS/TDAP  01/03/2028  . PNEUMOCOCCAL POLYSACCHARIDE VACCINE AGE 78-64 HIGH RISK  Completed  . COVID-19  Vaccine  Completed  . HPV VACCINES  Aged Out     ----------------------------------------------------------------------------------------------------------------------------------------------------------------------------------------------------------------- Physical Exam BP (!) 147/83 (BP Location: Left Arm, Patient Position: Sitting, Cuff Size: Large)   Pulse 94   Temp 97.6 F (36.4 C)   Ht 5\' 10"  (1.778 m)   Wt (!) 331 lb 1.6 oz (150.2 kg)   SpO2 95%   BMI 47.51 kg/m   Physical Exam Constitutional:      Appearance: She is well-developed.  HENT:     Head: Normocephalic and atraumatic.  Eyes:     General: No scleral icterus. Cardiovascular:     Rate and Rhythm: Normal rate and regular rhythm.  Pulmonary:     [place]ort: Pulmonary effort is normal.     Breath sounds: Normal breath sounds.  Musculoskeletal:     Cervical back: Neck supple.  Neurological:     General: No focal deficit present.     Mental Status: She is alert.  Psychiatric:        Mood and Affect: Mood normal.        Behavior: Behavior normal.     ------------------------------------------------------------------------------------------------------------------------------------------------------------------------------------------------------------------- Assessment and Plan  Dyspepsia Increase aciphex to bid dosing.  Can add carafate if not improving over the next few days.  Referral to GI for consideration of endoscopy and updated colonoscopy.    Meds ordered this encounter  Medications  .  RABEprazole (ACIPHEX) 20 MG tablet    Sig: Take 1 tablet (20 mg total) by mouth in the morning and at bedtime.    Dispense:  180 tablet    Refill:  1    Patient will pick up rx.  . sucralfate (CARAFATE) 1 g tablet    Sig: Take 1 tablet (1 g total) by mouth 4 (four) times daily -  with meals and at bedtime for 15 days.    Dispense:  60 tablet    Refill:  1    Patient will pick up rx    No follow-ups on file.    This visit occurred during the SARS-CoV-2 public health emergency.  Safety protocols were in place, including screening questions prior to the visit, additional usage of staff PPE, and extensive cleaning of exam room while observing appropriate contact time as indicated for disinfecting solutions.

## 2020-12-15 ENCOUNTER — Telehealth: Payer: Self-pay

## 2020-12-15 ENCOUNTER — Other Ambulatory Visit (HOSPITAL_COMMUNITY): Payer: Self-pay

## 2020-12-15 NOTE — Telephone Encounter (Signed)
Pt states insurance will not cover new dosage of Aciphex.   Requesting alternative or additional medication to address GERD.  Please advise.

## 2020-12-15 NOTE — Telephone Encounter (Signed)
Need to do PA. She has tried pantoprazole, omeprazole and esomeprazole previously.  Symptoms not well controlled with current dose of aciphex.  If denied we can try to get dexilant approved.

## 2020-12-19 ENCOUNTER — Other Ambulatory Visit (HOSPITAL_COMMUNITY): Payer: Self-pay

## 2020-12-19 ENCOUNTER — Telehealth: Payer: Self-pay | Admitting: Adult Health

## 2020-12-19 NOTE — Telephone Encounter (Signed)
Received fax from [employer] re: Corky Sox. FMLA  form needs completed. Placed on [name]'s desk.

## 2020-12-20 ENCOUNTER — Encounter: Payer: Self-pay | Admitting: Family Medicine

## 2020-12-20 ENCOUNTER — Other Ambulatory Visit (HOSPITAL_COMMUNITY): Payer: Self-pay

## 2020-12-20 DIAGNOSIS — R1013 Epigastric pain: Secondary | ICD-10-CM

## 2020-12-20 DIAGNOSIS — F3181 Bipolar II disorder: Secondary | ICD-10-CM | POA: Diagnosis not present

## 2020-12-21 DIAGNOSIS — F3181 Bipolar II disorder: Secondary | ICD-10-CM | POA: Diagnosis not present

## 2020-12-22 ENCOUNTER — Telehealth: Payer: Self-pay | Admitting: Adult Health

## 2020-12-22 ENCOUNTER — Other Ambulatory Visit (HOSPITAL_COMMUNITY): Payer: Self-pay

## 2020-12-22 MED ORDER — RABEPRAZOLE SODIUM 20 MG PO TBEC: 20.0000 mg | DELAYED_RELEASE_TABLET | Freq: Two times a day (BID) | ORAL | 1 refills | Status: DC

## 2020-12-22 NOTE — Telephone Encounter (Signed)
Received fax from [employer] regarding Tracy [name] for completion of FMLA form. Placed on [name]'s desk.

## 2020-12-23 ENCOUNTER — Encounter: Payer: Self-pay | Admitting: Family Medicine

## 2020-12-23 ENCOUNTER — Telehealth: Payer: 59 | Admitting: Physician Assistant

## 2020-12-23 DIAGNOSIS — K2100 Gastro-esophageal reflux disease with esophagitis, without bleeding: Secondary | ICD-10-CM

## 2020-12-23 NOTE — Progress Notes (Signed)
Based on what you shared with me, I feel your condition warrants further evaluation and I recommend that you be seen in a face to face office visit.   NOTE: If you entered your credit card information for this eVisit, you will not be charged. You may see a "hold" on your card for the $35 but that hold will drop off and you will not have a charge processed.   If you are having a true medical emergency please call 911.      For an urgent face to face visit, [facility] has six urgent care centers for your convenience:     [facility] [place] Get Driving Directions [phone] [address] [place], [place] [address] . 8 am - 4 pm Monday - Friday    [facility] ([place]) Get Driving Directions [phone] [address] [place], [place] [address] . 8 am to 8 pm Monday-Friday . 10 am to 6 pm Saturday-Sunday  [facility] ([place]) Get Driving Directions [phone]  [address] [place],  [place]  [address] . 8 am to 8 pm Monday-Friday . 8 am to 4 pm Saturday-Sunday  [facility] Get Driving Directions [phone] [address] [place], [place] [address] . 8 am to 8 pm Monday-Friday . 8 am to 4 pm Saturday-Sunday   [facility] Get Driving Directions  [phone] [address] [place], [place] [address] . 8 am to 8 pm Monday-Friday . 8 am to 4 pm Saturday-Sunday   [facility] [place] Get Driving Directions [phone] [address] [place], [place] [address] . 8 am to 8 pm Monday-Friday . 8 am to 4 pm Saturday-Sunday     Your MyChart E-visit questionnaire answers were reviewed by a board certified advanced clinical practitioner to complete your personal care plan based on your specific symptoms.  Thank you for using e-Visits.   I  provided 5 minutes of non face-to-face time during this encounter for chart review and documentation.   

## 2020-12-24 ENCOUNTER — Encounter: Payer: Self-pay | Admitting: Family Medicine

## 2020-12-25 NOTE — Telephone Encounter (Signed)
June 16th @ [place], [place]

## 2020-12-26 ENCOUNTER — Other Ambulatory Visit: Payer: Self-pay

## 2020-12-26 ENCOUNTER — Other Ambulatory Visit (HOSPITAL_BASED_OUTPATIENT_CLINIC_OR_DEPARTMENT_OTHER): Payer: Self-pay

## 2020-12-26 ENCOUNTER — Ambulatory Visit: Payer: 59 | Admitting: Family Medicine

## 2020-12-26 ENCOUNTER — Encounter: Payer: Self-pay | Admitting: Family Medicine

## 2020-12-26 ENCOUNTER — Ambulatory Visit (INDEPENDENT_AMBULATORY_CARE_PROVIDER_SITE_OTHER): Payer: 59

## 2020-12-26 VITALS — BP 157/81 | HR 103 | Temp 98.7°F | Ht 70.0 in | Wt 324.6 lb

## 2020-12-26 DIAGNOSIS — I728 Aneurysm of other specified arteries: Secondary | ICD-10-CM | POA: Diagnosis not present

## 2020-12-26 DIAGNOSIS — E119 Type 2 diabetes mellitus without complications: Secondary | ICD-10-CM

## 2020-12-26 DIAGNOSIS — R1033 Periumbilical pain: Secondary | ICD-10-CM | POA: Diagnosis not present

## 2020-12-26 DIAGNOSIS — R1031 Right lower quadrant pain: Secondary | ICD-10-CM

## 2020-12-26 DIAGNOSIS — R634 Abnormal weight loss: Secondary | ICD-10-CM | POA: Diagnosis not present

## 2020-12-26 DIAGNOSIS — R110 Nausea: Secondary | ICD-10-CM | POA: Diagnosis not present

## 2020-12-26 DIAGNOSIS — F3181 Bipolar II disorder: Secondary | ICD-10-CM | POA: Diagnosis not present

## 2020-12-26 MED ORDER — TRAMADOL HCL 50 MG PO TABS
50.0000 mg | ORAL_TABLET | Freq: Three times a day (TID) | ORAL | 0 refills | Status: DC | PRN
  Filled 2020-12-26: qty 30, 5d supply, fill #0

## 2020-12-26 NOTE — Assessment & Plan Note (Signed)
Stat CT ordered today Checking CMP, lipase and CBC.  Continue aciphex and adding carafate.  Will see if holding Ozempic and liothyronine helps as well.  GI appt moved to tomorrow.

## 2020-12-26 NOTE — Patient Instructions (Signed)
Let's hold ozempic and liothyronine  Have labs completed.  Go ahead and head downstairs to have CT completed.

## 2020-12-26 NOTE — Progress Notes (Signed)
Tracy [name] - 55 y.o. female MRN [id number]  Date of birth: 1966/03/31  Subjective Chief Complaint  Patient presents with  . Abdominal Pain    HPI Tracy [name] is a 55 y.o. female here today with complaint of abdominal pain.  She has had increased reflux symptoms that haven't been controlled with increased dose of aciphex.  She has not received carafate rx yet.  Pain is located in the epigastric area, periumbilical area with some radiation to the RLQ area.  She reports previous appendectomy.  She was recently started on Ozempic and liothyronine.  Her bowels seem to be moving normally.  She denies fever, chills.  She has been referred to GI, we are trying to get this appt moved to a closer date.   ROS:  A comprehensive ROS was completed and negative except as noted per HPI  Allergies  Allergen Reactions  . Sulfa Antibiotics Itching, Swelling and Rash  . Bupropion Hcl Itching and Rash  . Codeine Itching  . Sulfamethoxazole-Trimethoprim Rash    Past Medical History:  Diagnosis Date  . Anxiety   . Arthritis    back, knees, right elbow  . Back pain   . Bilateral swelling of feet and ankles   . Bipolar disorder ([place])   . Borderline personality disorder ([place])   . Cancer (HCC)    oral  . Chewing difficulty   . Cirrhosis ([place])   . Constipation   . Dental crowns present   . Depression   . Diabetes mellitus without complication ([place])   . Fatty liver   . Food allergy    Walnuts  . GAD (generalized anxiety disorder)   . GERD (gastroesophageal reflux disease)   . High cholesterol    no current med.  Marland Kitchen History of kidney stones   . History of migraine headaches   . Hypertension    under control with meds., has been on med. x 4 yr.  . Hypothyroidism   . IBS (irritable bowel syndrome)   . Infertility, female   . Joint pain   . Lichen planus   . Liver cirrhosis secondary to NASH (nonalcoholic steatohepatitis) ([place])   . NASH (nonalcoholic steatohepatitis)    . Osteoarthritis   . Other fatigue   . Palpitations   . PCOS (polycystic ovarian syndrome)   . PONV (postoperative nausea and vomiting)    also hx. of emergence delirium 2007  . Shortness of breath on exertion   . Sleep apnea    uses CPAP nightly  . Trigeminal neuralgia   . Type 2 diabetes mellitus ([place])   . Vitamin D deficiency     Past Surgical History:  Procedure Laterality Date  . ACHILLES TENDON SURGERY Left 2007  . APPENDECTOMY    . CARPAL TUNNEL RELEASE Right 01/12/2016   Procedure: RIGHT CARPAL TUNNEL RELEASE;  Surgeon: Roseanne Kaufman, MD;  Location: [facility];  Service: Orthopedics;  Laterality: Right;  . COLONOSCOPY WITH PROPOFOL N/A 04/20/2014   Procedure: COLONOSCOPY WITH PROPOFOL;  Surgeon: Arta Silence, MD;  [facility];  Service: Endoscopy;  Laterality: N/A;  . ESOPHAGOGASTRODUODENOSCOPY (EGD) WITH PROPOFOL N/A 04/20/2014   Procedure: ESOPHAGOGASTRODUODENOSCOPY (EGD) WITH PROPOFOL;  Surgeon: Arta Silence, MD;  [facility];  Service: Endoscopy;  Laterality: N/A;  . EXCISION HAGLUND'S DEFORMITY WITH ACHILLES TENDON REPAIR Right 02/25/2013   Procedure: RIGHT ACHILLES DEBRIDEMENT AND RECONSTRUCTION;  HAGLUND'S EXCISION; GASTROC RECESSION AND FLEXOR HALLUCIS LONGUS TRANSFER;  Surgeon: Wylene Simmer, MD;  Location: [facility];  Service: Orthopedics;  Laterality: Right;  . JOINT REPLACEMENT     hip  . LUMBAR LAMINECTOMY     X 3  . neck fusion    . OVARIAN CYST SURGERY Left 1998  . PILONIDAL CYST EXCISION  1994  . RIGHT OOPHORECTOMY    . STERIOD INJECTION Left 01/12/2016   Procedure: STEROID INJECTION LEFT WRIST;  Surgeon: Roseanne Kaufman, MD;  Location: [facility];  Service: Orthopedics;  Laterality: Left;  . TOTAL HIP ARTHROPLASTY Left 03/19/2019   Procedure: LEFT TOTAL HIP ARTHROPLASTY ANTERIOR APPROACH;  Surgeon: Mcarthur Rossetti, MD;  [facility];  Service: Orthopedics;  Laterality: Left;  .  TOTAL THYROIDECTOMY  2003  . UPPER GI ENDOSCOPY  01/19/2015    Social History   Socioeconomic History  . Marital status: Legally Separated    Spouse name: Not on file  . Number of children: 0  . Years of education: 3  . Highest education level: Not on file  Occupational History  . 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 Smoking status: Former Smoker    Packs/day: 0.50    Years: 15.00    Pack years: 7.50    Types: Cigarettes    Quit date: 02/2019    Years since quitting: 1.8  . Smokeless tobacco: Never Used  Vaping Use  . Vaping Use: Never used  Substance and Sexual Activity  . Alcohol use: No    Alcohol/week: 0.0 standard drinks    Comment: Has not had any alcohol since 07/2014 - before this date, she rarely drink.  . Drug use: No  . Sexual activity: Not Currently    Partners: Male    Birth control/protection: Condom  Other Topics Concern  . Not on file  Social History Narrative   RN - [employer]   Regular exercise: no   Caffeine use: 2 x daily   Right-handed.   Lives alone.   Social Determinants of Health   Financial Resource Strain: Not on file  Food Insecurity: Not on file  Transportation Needs: Not on file  Physical Activity: Not on file  Stress: Not on file  Social Connections: Not on file    Family History  Problem Relation Age of Onset  . Heart disease Father   . Depression Father   . Heart attack Father   . Sudden death Father   . Anxiety disorder Father   . Bipolar disorder Father   . Obesity Father   . Cancer Brother        THROAT colon  . Depression Brother   . Anxiety disorder Brother   . Depression Sister   . Breast cancer Mother   . Hypertension Mother   . Cancer Mother   . Depression Mother   . Sleep apnea Mother   . Obesity Mother   . Depression Maternal Grandmother   . Stroke Maternal Grandfather     Health Maintenance  Topic Date Due   . FOOT EXAM  Never done  . HIV Screening  Never done  . Hepatitis C Screening  Never done  . PAP SMEAR-Modifier  Never done  . MAMMOGRAM  10/28/2017  . OPHTHALMOLOGY EXAM  03/28/2020  . INFLUENZA VACCINE  03/05/2021  . HEMOGLOBIN A1C  03/21/2021  . COLONOSCOPY (Pts 45-46yrs Insurance coverage will need to be confirmed)  04/20/2024  . TETANUS/TDAP  01/03/2028  . PNEUMOCOCCAL POLYSACCHARIDE  VACCINE AGE 58-64 HIGH RISK  Completed  . COVID-19 Vaccine  Completed  . HPV VACCINES  Aged Out     ----------------------------------------------------------------------------------------------------------------------------------------------------------------------------------------------------------------- Physical Exam BP (!) 157/81 (BP Location: Left Arm, Patient Position: Sitting, Cuff Size: Large)   Pulse (!) 103   Temp 98.7 F (37.1 C) (Oral)   Ht 5\' 10"  (1.778 m)   Wt (!) 324 lb 9.6 oz (147.2 kg)   SpO2 93%   BMI 46.58 kg/m   Physical Exam Constitutional:      Appearance: She is well-developed. She is obese.  Eyes:     General: No scleral icterus. Cardiovascular:     Rate and Rhythm: Normal rate and regular rhythm.  Pulmonary:     Effort: Pulmonary effort is normal.     Breath sounds: Normal breath sounds.  Abdominal:     General: There is no distension.     Palpations: Abdomen is soft.     Tenderness: There is abdominal tenderness (Periumbilical and RLQ).  Neurological:     General: No focal deficit present.     Mental Status: She is alert.  Psychiatric:        Mood and Affect: Mood normal.        Behavior: Behavior normal.     ------------------------------------------------------------------------------------------------------------------------------------------------------------------------------------------------------------------- Assessment and Plan  Periumbilical pain Stat CT ordered today Checking CMP, lipase and CBC.  Continue aciphex and adding carafate.   Will see if holding Ozempic and liothyronine helps as well.  GI appt moved to tomorrow.     Meds ordered this encounter  Medications  . traMADol (ULTRAM) 50 MG tablet    Sig: Take 1-2 tablets (50-100 mg total) by mouth 3 (three) times daily as needed for up to 5 days.    Dispense:  30 tablet    Refill:  0    No follow-ups on file.    This visit occurred during the SARS-CoV-2 public health emergency.  Safety protocols were in place, including screening questions prior to the visit, additional usage of staff PPE, and extensive cleaning of exam room while observing appropriate contact time as indicated for disinfecting solutions.

## 2020-12-27 ENCOUNTER — Other Ambulatory Visit (HOSPITAL_BASED_OUTPATIENT_CLINIC_OR_DEPARTMENT_OTHER): Payer: Self-pay

## 2020-12-27 ENCOUNTER — Ambulatory Visit (INDEPENDENT_AMBULATORY_CARE_PROVIDER_SITE_OTHER): Payer: 59 | Admitting: Gastroenterology

## 2020-12-27 ENCOUNTER — Encounter: Payer: Self-pay | Admitting: Family Medicine

## 2020-12-27 ENCOUNTER — Ambulatory Visit: Payer: 59 | Admitting: Adult Health

## 2020-12-27 ENCOUNTER — Encounter: Payer: Self-pay | Admitting: Gastroenterology

## 2020-12-27 ENCOUNTER — Telehealth: Payer: Self-pay | Admitting: Adult Health

## 2020-12-27 VITALS — BP 142/88 | HR 100 | Ht 70.0 in | Wt 323.0 lb

## 2020-12-27 DIAGNOSIS — R1084 Generalized abdominal pain: Secondary | ICD-10-CM | POA: Diagnosis not present

## 2020-12-27 DIAGNOSIS — R197 Diarrhea, unspecified: Secondary | ICD-10-CM

## 2020-12-27 DIAGNOSIS — K219 Gastro-esophageal reflux disease without esophagitis: Secondary | ICD-10-CM | POA: Diagnosis not present

## 2020-12-27 DIAGNOSIS — G4733 Obstructive sleep apnea (adult) (pediatric): Secondary | ICD-10-CM

## 2020-12-27 DIAGNOSIS — R112 Nausea with vomiting, unspecified: Secondary | ICD-10-CM

## 2020-12-27 LAB — COMPLETE METABOLIC PANEL WITH GFR
AG Ratio: 1.5 (calc) (ref 1.0–2.5)
ALT: 34 U/L — ABNORMAL HIGH (ref 6–29)
AST: 42 U/L — ABNORMAL HIGH (ref 10–35)
Albumin: 4.3 g/dL (ref 3.6–5.1)
Alkaline phosphatase (APISO): 123 U/L (ref 37–153)
BUN: 9 mg/dL (ref 7–25)
CO2: 23 mmol/L (ref 20–32)
Calcium: 9.6 mg/dL (ref 8.6–10.4)
Chloride: 102 mmol/L (ref 98–110)
Creat: 0.7 mg/dL (ref 0.50–1.05)
GFR, Est African American: 115 mL/min/{1.73_m2} (ref >=60)
GFR, Est Non African American: 99 mL/min/{1.73_m2} (ref >=60)
Globulin: 2.9 g/dL (calc) (ref 1.9–3.7)
Glucose, Bld: 303 mg/dL — ABNORMAL HIGH (ref 65–99)
Potassium: 4.3 mmol/L (ref 3.5–5.3)
Sodium: 134 mmol/L — ABNORMAL LOW (ref 135–146)
Total Bilirubin: 0.2 mg/dL (ref 0.2–1.2)
Total Protein: 7.2 g/dL (ref 6.1–8.1)

## 2020-12-27 LAB — CBC WITH DIFFERENTIAL/PLATELET
Absolute Monocytes: 308 cells/uL (ref 200–950)
Basophils Absolute: 29 cells/uL (ref 0–200)
Basophils Relative: 0.7 %
Eosinophils Absolute: 127 cells/uL (ref 15–500)
Eosinophils Relative: 3.1 %
HCT: 41 % (ref 35.0–45.0)
Hemoglobin: 12.2 g/dL (ref 11.7–15.5)
Lymphs Abs: 1476 cells/uL (ref 850–3900)
MCH: 23.5 pg — ABNORMAL LOW (ref 27.0–33.0)
MCHC: 29.8 g/dL — ABNORMAL LOW (ref 32.0–36.0)
MCV: 79 fL — ABNORMAL LOW (ref 80.0–100.0)
MPV: 10.3 fL (ref 7.5–12.5)
Monocytes Relative: 7.5 %
Neutro Abs: 2161 cells/uL (ref 1500–7800)
Neutrophils Relative %: 52.7 %
Platelets: 225 10*3/uL (ref 140–400)
RBC: 5.19 10*6/uL — ABNORMAL HIGH (ref 3.80–5.10)
RDW: 16.3 % — ABNORMAL HIGH (ref 11.0–15.0)
Total Lymphocyte: 36 %
WBC: 4.1 10*3/uL (ref 3.8–10.8)

## 2020-12-27 LAB — LIPASE: Lipase: 56 U/L (ref 7–60)

## 2020-12-27 LAB — HEMOGLOBIN A1C
Hgb A1c MFr Bld: 8.3 % of total Hgb — ABNORMAL HIGH (ref <5.7)
Mean Plasma Glucose: 192 mg/dL
eAG (mmol/L): 10.6 mmol/L

## 2020-12-27 MED ORDER — FAMOTIDINE 40 MG PO TABS
40.0000 mg | ORAL_TABLET | Freq: Two times a day (BID) | ORAL | 0 refills | Status: DC
  Filled 2020-12-27: qty 60, 30d supply, fill #0

## 2020-12-27 MED ORDER — CLENPIQ 10-3.5-12 MG-GM -GM/160ML PO SOLN
1.0000 | ORAL | 0 refills | Status: DC
  Filled 2020-12-27: qty 320, 1d supply, fill #0

## 2020-12-27 NOTE — Patient Instructions (Addendum)
If you are age 53 or older, your body mass index should be between 23-30. Your Body mass index is 46.35 kg/m. If this is out of the aforementioned range listed, please consider follow up with your Primary Care Provider.  If you are age 6 or younger, your body mass index should be between 19-25. Your Body mass index is 46.35 kg/m. If this is out of the aformentioned range listed, please consider follow up with your Primary Care Provider.   Please go to the 2nd floor of this building today and schedule your labwork, [address], [address].  We have sent the following medications to your pharmacy for you to pick up at your convenience:  pepcid 40mg  twice daily  Due to recent COVID-19 restrictions implemented by our local and state authorities and in an effort to keep both patients and staff as safe as possible, our hospital system now requires COVID-19 testing prior to any scheduled hospital procedure. Please go to [facility], [place], [place] [address] on 01/05/2021 at  9:40am. This is a drive up testing site, you will not need to exit your vehicle.  You will not be billed at the time of testing but may receive a bill later depending on your insurance. The approximate cost of the test is $100. You must agree to quarantine from the time of your testing until the procedure date on 01/10/2021 . This should include staying at home with ONLY the people you live with. Avoid take-out, grocery store shopping or leaving the house for any non-emergent reason. Failure to have your COVID-19 test done on the date and time you have been scheduled will result in cancellation of procedure. Please call our office at [phone] if you have any questions.    .Due to recent changes in healthcare laws, you may see the results of your imaging and laboratory studies on MyChart before your provider has had a chance to review them.  We understand that in some cases there may be results that are confusing or  concerning to you. Not all laboratory results come back in the same time frame and the provider may be waiting for multiple results in order to interpret others.  Please give [place] 48 hours in order for your provider to thoroughly review all the results before contacting the office for clarification of your results.   Thank you for choosing me and [facility].  Vito Cirigliano, D.O.

## 2020-12-27 NOTE — Telephone Encounter (Signed)
Received fax from [employer] regarding Tracy [name]. Completion needed on FMLA  form. Placed on [name]'s desk.

## 2020-12-27 NOTE — Progress Notes (Signed)
Chief Complaint: Abdominal pain, nausea, weight loss, diarrhea, change in bowel habits   Referring Provider:     Luetta Nutting, DO   HPI:     Tracy [name] is a 55 y.o. female with a history of HTN, bipolar, diabetes, migraines, thyroid carcinoma s/p total thyroidectomy with subsequent hypothyroidism, obesity, sleep apnea (on CPAP), PONV (postoperative nausea and vomiting), PCOS, referred to the [facility]ology [facility] for evaluation of multiple GI symptoms to include abdominal pain, nausea, diarrhea, weight loss, decreased appetite.  Abdominal pain in MEG with dull ache in RUQ and RLQ, which started approximately 4-6 weeks ago. Has lost 20# over the last 3 weeks due to nausea, decreased appetite. Pain was intermittent, and now constant. No night sweats or fevers.   Was having diarrhea over last 4-6 weeks as well, which she thought was d/t restarting Metformin recently. Now on Ozempic as well.   Has a long hx of GERD, and has been taking PPI for years.  Symptoms have been controlled with Aciphex for years, but more recently with increased HB, belching. Increased Aciphex to bid w/o improvement. Has started OTC Zantac and Mylanta prn.  Has a prescription for Carafate which just arrived yesterday but has not yet started.  Was given Tramadol yesterday, which worked well for her pain, but didn't want to take today since she was driving.   Labs/rads completed yesterday n/f following: - H/H 12.2/41 with MCV/RDW 79/16, all stable from previous - Normal lipase - AST/ALT 42/34, BG 303 otherwise normal CMP - A1c 8.3% - CT abdomen/pelvis: Minimal lobular hepatic margins that could suggest early cirrhosis, otherwise unremarkable  Previously followed with [facility] for history of NASH with advanced fibrosis, last seen in 03/2018.  While she has histologic e/o early cirrhosis on liver biopsy in 12/2014, last clinic note indicates intact hepatic function.      -Liver biopsy 12/2014 with steatohepatitis, grade 1, stage IV (cirrhosis) - Abdominal ultrasound: 03/2018: Heterogenous liver c/w NASH, no ascites   Endoscopic History: - EGD (01/2015): Mild portal hypertensive gastropathy, otherwise normal   Past Medical History:  Diagnosis Date  . Anxiety   . Arthritis    back, knees, right elbow  . Back pain   . Bilateral swelling of feet and ankles   . Bipolar disorder ([place])   . Borderline personality disorder ([place])   . Cancer (HCC)    oral  . Chewing difficulty   . Cirrhosis ([place])   . Constipation   . Dental crowns present   . Depression   . Diabetes mellitus without complication ([place])   . Fatty liver   . Food allergy    Walnuts  . GAD (generalized anxiety disorder)   . GERD (gastroesophageal reflux disease)   . High cholesterol    no current med.  Marland Kitchen History of kidney stones   . History of migraine headaches   . Hypertension    under control with meds., has been on med. x 4 yr.  . Hypothyroidism   . IBS (irritable bowel syndrome)   . Infertility, female   . Joint pain   . Lichen planus   . Liver cirrhosis secondary to NASH (nonalcoholic steatohepatitis) ([place])   . NASH (nonalcoholic steatohepatitis)   . Osteoarthritis   . Other fatigue   . Palpitations   . PCOS (polycystic ovarian syndrome)   . PONV (postoperative nausea and vomiting)    also hx. of emergence delirium  2007  . Shortness of breath on exertion   . Sleep apnea    uses CPAP nightly  . Trigeminal neuralgia   . Type 2 diabetes mellitus ([place])   . Vitamin D deficiency      Past Surgical History:  Procedure Laterality Date  . ACHILLES TENDON SURGERY Left 2007  . APPENDECTOMY    . CARPAL TUNNEL RELEASE Right 01/12/2016   Procedure: RIGHT CARPAL TUNNEL RELEASE;  Surgeon: Roseanne Kaufman, MD;  Location: [facility];  Service: Orthopedics;  Laterality: Right;  . COLONOSCOPY WITH PROPOFOL N/A 04/20/2014   Procedure: COLONOSCOPY WITH PROPOFOL;   Surgeon: Arta Silence, MD;  [facility];  Service: Endoscopy;  Laterality: N/A;  . ESOPHAGOGASTRODUODENOSCOPY (EGD) WITH PROPOFOL N/A 04/20/2014   Procedure: ESOPHAGOGASTRODUODENOSCOPY (EGD) WITH PROPOFOL;  Surgeon: Arta Silence, MD;  [facility];  Service: Endoscopy;  Laterality: N/A;  . EXCISION HAGLUND'S DEFORMITY WITH ACHILLES TENDON REPAIR Right 02/25/2013   Procedure: RIGHT ACHILLES DEBRIDEMENT AND RECONSTRUCTION;  HAGLUND'S EXCISION; GASTROC RECESSION AND FLEXOR HALLUCIS LONGUS TRANSFER;  Surgeon: Wylene Simmer, MD;  Location: [facility];  Service: Orthopedics;  Laterality: Right;  . JOINT REPLACEMENT     hip  . LUMBAR LAMINECTOMY     X 3  . neck fusion    . OVARIAN CYST SURGERY Left 1998  . PILONIDAL CYST EXCISION  1994  . RIGHT OOPHORECTOMY    . STERIOD INJECTION Left 01/12/2016   Procedure: STEROID INJECTION LEFT WRIST;  Surgeon: Roseanne Kaufman, MD;  Location: [facility];  Service: Orthopedics;  Laterality: Left;  . TOTAL HIP ARTHROPLASTY Left 03/19/2019   Procedure: LEFT TOTAL HIP ARTHROPLASTY ANTERIOR APPROACH;  Surgeon: Mcarthur Rossetti, MD;  [facility];  Service: Orthopedics;  Laterality: Left;  . TOTAL THYROIDECTOMY  2003  . UPPER GI ENDOSCOPY  01/19/2015   Family History  Problem Relation Age of Onset  . Heart disease Father   . Depression Father   . Heart attack Father   . Sudden death Father   . Anxiety disorder Father   . Bipolar disorder Father   . Obesity Father   . Cancer Brother        THROAT cancer  . Depression Brother   . Anxiety disorder Brother   . Depression Sister   . Breast cancer Mother   . Hypertension Mother   . Cancer Mother   . Depression Mother   . Sleep apnea Mother   . Obesity Mother   . Depression Maternal Grandmother   . Stroke Maternal Grandfather   . Colon cancer Brother   . Pancreatic cancer Neg Hx   . Stomach cancer Neg Hx   . Liver disease Neg Hx    Social History    Tobacco Use  . Smoking status: Former Smoker    Packs/day: 0.50    Years: 15.00    Pack years: 7.50    Types: Cigarettes    Quit date: 02/2019    Years since quitting: 1.8  . Smokeless tobacco: Never Used  Vaping Use  . Vaping Use: Never used  Substance Use Topics  . Alcohol use: No    Alcohol/week: 0.0 standard drinks    Comment: Has not had any alcohol since 07/2014 - before this date, she rarely drink.  . Drug use: No   Current Outpatient Medications  Medication Sig Dispense Refill  . ALPRAZolam (XANAX XR) 0.5 MG 24 hr tablet TAKE 1 TABLET BY MOUTH 2 TIMES DAILY 180 tablet  0  . ARIPiprazole (ABILIFY) 10 MG tablet Take 10 mg by mouth at bedtime.    . Blood Glucose Monitoring Suppl (FREESTYLE LITE) w/Device KIT USE TO CHECK BLOOD SUGAR ONCE A DAY 1 kit 0  . Blood Glucose Monitoring Suppl (FREESTYLE LITE) w/Device KIT USE TO CHECK BLOOD SUGAR ONCE A DAY 1 kit 0  . Carbamazepine (EQUETRO) 300 MG CP12 Take 2 capsules by mouth at bedtime. Take 2 capsules by mouth daily at bedtime.    . clobetasol (TEMOVATE) 0.05 % GEL APPLY TOPICALLY 2 (TWO) TIMES DAILY DRY AREAS FIRST AND THEN APPLY IN THIN LAYER. BE STILL (NO DRINKING, EATING, OR SPEAKING FOR 20 MINUTES 60 g 1  . clonazePAM (KLONOPIN) 1 MG tablet Take 1 mg by mouth 3 (three) times daily.   5  . cyclobenzaprine (FLEXERIL) 10 MG tablet TAKE 1 TABLET BY MOUTH 2 TIMES DAILY AS NEEDED FOR MUSCLE SPASMS 20 tablet 0  . furosemide (LASIX) 40 MG tablet TAKE 1 TABLET BY MOUTH DAILY. 30 tablet 3  . glucose blood test strip USE TO CHECK BLOOD SUGAR ONCE A DAY (Patient taking differently: USE TO CHECK BLOOD SUGAR ONCE A DAY) 100 strip 12  . hydrOXYzine (ATARAX/VISTARIL) 25 MG tablet Take 1 tablet (25 mg total) by mouth 3 (three) times daily as needed. 90 tablet 2  . ketorolac (TORADOL) 10 MG tablet TAKE 1 TABLET BY MOUTH EVERY 6 HOURS AS NEEDED 20 tablet 0  . Lancets (FREESTYLE) lancets USE TO TEST BLOOD SUGAR ONCE A DAY (Patient taking  differently: USE TO TEST BLOOD SUGAR ONCE A DAY) 100 each 0  . lidocaine (XYLOCAINE) 5 % ointment APPLY 1 G TOPICALLY 3 (THREE) TIMES DAILY BEFORE MEALS (DOESNT COME IN GEL, FAXED DR) 1063.2 g 2  . lisinopril-hydrochlorothiazide (ZESTORETIC) 10-12.5 MG tablet TAKE 1 TABLET BY MOUTH ONCE DAILY 90 tablet 1  . metFORMIN (GLUCOPHAGE-XR) 500 MG 24 hr tablet TAKE 2 TABLETS BY MOUTH ONCE A DAY WITH BREAKFAST 180 tablet 1  . NUVIGIL 200 MG TABS Take 1 tablet by mouth in the morning.    . nystatin (MYCOSTATIN/NYSTOP) powder Apply 3 times daily as directed. 60 g 3  . ondansetron (ZOFRAN-ODT) 4 MG disintegrating tablet 4 mg 2 (two) times daily as needed.    . pimecrolimus (ELIDEL) 1 % cream APPLY TOPICALLY 2 TIMES DAILY TO COMPLETELY DRY AREA PLACE A THIN LAYER ONTO THE MOST SORE AREAS PRIOR TO BED. DO NOT RINSE 60 g 0  . RABEprazole (ACIPHEX) 20 MG tablet Take 1 tablet (20 mg total) by mouth in the morning and at bedtime. 180 tablet 1  . rizatriptan (MAXALT) 10 MG tablet Take 1 tablet (10 mg total) by mouth as needed for migraine. May repeat in 2 hours if needed 10 tablet 0  . rosuvastatin (CRESTOR) 20 MG tablet TAKE 1 TABLET BY MOUTH DAILY. 90 tablet 3  . sertraline (ZOLOFT) 100 MG tablet TAKE 2 TABLETS BY MOUTH EVERY MORNING 60 tablet 0  . sucralfate (CARAFATE) 1 g tablet Take 1 tablet (1 g total) by mouth 4 (four) times daily -  with meals and at bedtime for 15 days. 60 tablet 1  . SYNTHROID 300 MCG tablet TAKE 1 TABLET BY MOUTH ONCE A DAY BEFORE BREAKFAST 90 tablet 1  . traMADol (ULTRAM) 50 MG tablet Take 1-2 tablets (50-100 mg total) by mouth 3 (three) times daily as needed for up to 5 days. 30 tablet 0  . Vitamin D, Ergocalciferol, (DRISDOL) 1.25 MG (50000 UNIT) CAPS  capsule TAKE 1 CAPSULE BY MOUTH EVERY 7 DAYS. 12 capsule 1   No current facility-administered medications for this visit.   Allergies  Allergen Reactions  . Sulfa Antibiotics Itching, Swelling and Rash  . Bupropion Hcl Itching and Rash   . Codeine Itching  . Sulfamethoxazole-Trimethoprim Rash     Review of Systems: All systems reviewed and negative except where noted in HPI.     Physical Exam:    Wt Readings from Last 3 Encounters:  12/27/20 (!) 323 lb (146.5 kg)  12/26/20 (!) 324 lb 9.6 oz (147.2 kg)  12/14/20 (!) 331 lb 1.6 oz (150.2 kg)    BP (!) 142/88   Pulse 100   Ht 5' 10"  (1.778 m)   Wt (!) 323 lb (146.5 kg)   SpO2 97%   BMI 46.35 kg/m  Constitutional:  Pleasant, in no acute distress. Psychiatric: Normal mood and affect. Behavior is normal. EENT: Pupils normal.  Conjunctivae are normal. No scleral icterus. Neck supple. No cervical LAD. Cardiovascular: Normal rate, regular rhythm. No edema Pulmonary/chest: Effort normal and breath sounds normal. No wheezing, rales or rhonchi. Abdominal: TTP in epigastrium along with  b/l lower abdomen.  No peritoneal signs.  Soft. Bowel sounds active throughout. There are no masses palpable. No hepatomegaly. Neurological: Alert and oriented to person place and time. Skin: Skin is warm and dry. No rashes noted.   ASSESSMENT AND PLAN;   1) Epigastric pain 2) Lower abdominal pain  3) Diarrhea/Change in bowel habits 4) Nausea 5) Weight loss - Plan for expedited endoscopic evaluation with EGD/colonoscopy to evaluate for mucosal/luminal pathology - Random directed biopsies during EGD/colonoscopy as above   6) GERD 7) Belching - Continue high-dose Aciphex for now - Add Pepcid 40 mg bid - Start Carafate that was recently prescribed - EGD to evaluate for erosive esophagitis, LES laxity, hiatal hernia  8) Mild microcytic anemia - While H/H in normal range and MCV/RDW are stable from 02/2020, there is a departure from 03/2020 H/H was 14/44 and MCV/RDW was 87/15. - Check iron, B12, folate - EGD/colonoscopy as above  9) Obesity 10) OSA 11) Diabetes - Plan for procedures to be done at [facility] given elevated periprocedural risks  The indications, risks, and  benefits of EGD and colonoscopy were explained to the patient in detail. Risks include but are not limited to bleeding, perforation, adverse reaction to medications, and cardiopulmonary compromise. Sequelae include but are not limited to the possibility of surgery, hospitalization, and mortality. The patient verbalized understanding and wished to proceed. All questions answered, referred to scheduler and bowel prep ordered. Further recommendations pending results of the exam.     Lavena Bullion, DO, FACG  12/27/2020, 3:14 PM   Luetta Nutting, DO

## 2020-12-27 NOTE — H&P (View-Only) (Signed)
Chief Complaint: Abdominal pain, nausea, weight loss, diarrhea, change in bowel habits   Referring Provider:     Luetta Nutting, DO   HPI:     Tracy [name] is a 55 y.o. female with a history of HTN, bipolar, diabetes, migraines, thyroid carcinoma s/p total thyroidectomy with subsequent hypothyroidism, obesity, sleep apnea (on CPAP), PONV (postoperative nausea and vomiting), PCOS, referred to the [facility] for evaluation of multiple GI symptoms to include abdominal pain, nausea, diarrhea, weight loss, decreased appetite.  Abdominal pain in MEG with dull ache in RUQ and RLQ, which started approximately 4-6 weeks ago. Has lost 20# over the last 3 weeks due to nausea, decreased appetite. Pain was intermittent, and now constant. No night sweats or fevers.   Was having diarrhea over last 4-6 weeks as well, which she thought was d/t restarting Metformin recently. Now on Ozempic as well.   Has a long hx of GERD, and has been taking PPI for years.  Symptoms have been controlled with Aciphex for years, but more recently with increased HB, belching. Increased Aciphex to bid w/o improvement. Has started OTC Zantac and Mylanta prn.  Has a prescription for Carafate which just arrived yesterday but has not yet started.  Was given Tramadol yesterday, which worked well for her pain, but didn't want to take today since she was driving.   Labs/rads completed yesterday n/f following: - H/H 12.2/41 with MCV/RDW 79/16, all stable from previous - Normal lipase - AST/ALT 42/34, BG 303 otherwise normal CMP - A1c 8.3% - CT abdomen/pelvis: Minimal lobular hepatic margins that could suggest early cirrhosis, otherwise unremarkable  Previously followed with [place] [facility] for history of NASH with advanced fibrosis, last seen in 03/2018.  While she has histologic e/o early cirrhosis on liver biopsy in 12/2014, last clinic note indicates intact hepatic function.      -Liver biopsy 12/2014 with steatohepatitis, grade 1, stage IV (cirrhosis) - Abdominal ultrasound: 03/2018: Heterogenous liver c/w NASH, no ascites   Endoscopic History: - EGD (01/2015): Mild portal hypertensive gastropathy, otherwise normal   Past Medical History:  Diagnosis Date  . Anxiety   . Arthritis    back, knees, right elbow  . Back pain   . Bilateral swelling of feet and ankles   . Bipolar disorder ([place])   . Borderline personality disorder ([place])   . Cancer (HCC)    oral  . Chewing difficulty   . Cirrhosis ([place])   . Constipation   . Dental crowns present   . Depression   . Diabetes mellitus without complication ([place])   . Fatty liver   . Food allergy    Walnuts  . GAD (generalized anxiety disorder)   . GERD (gastroesophageal reflux disease)   . High cholesterol    no current med.  Marland Kitchen History of kidney stones   . History of migraine headaches   . Hypertension    under control with meds., has been on med. x 4 yr.  . Hypothyroidism   . IBS (irritable bowel syndrome)   . Infertility, female   . Joint pain   . Lichen planus   . Liver cirrhosis secondary to NASH (nonalcoholic steatohepatitis) ([place])   . NASH (nonalcoholic steatohepatitis)   . Osteoarthritis   . Other fatigue   . Palpitations   . PCOS (polycystic ovarian syndrome)   . PONV (postoperative nausea and vomiting)    also hx. of emergence delirium  2007  . Shortness of breath on exertion   . Sleep apnea    uses CPAP nightly  . Trigeminal neuralgia   . Type 2 diabetes mellitus ([place])   . Vitamin D deficiency      Past Surgical History:  Procedure Laterality Date  . ACHILLES TENDON SURGERY Left 2007  . APPENDECTOMY    . CARPAL TUNNEL RELEASE Right 01/12/2016   Procedure: RIGHT CARPAL TUNNEL RELEASE;  Surgeon: Roseanne Kaufman, MD;  Location: [facility];  Service: Orthopedics;  Laterality: Right;  . COLONOSCOPY WITH PROPOFOL N/A 04/20/2014   Procedure: COLONOSCOPY WITH PROPOFOL;   Surgeon: Arta Silence, MD;  Location: [facility];  Service: Endoscopy;  Laterality: N/A;  . ESOPHAGOGASTRODUODENOSCOPY (EGD) WITH PROPOFOL N/A 04/20/2014   Procedure: ESOPHAGOGASTRODUODENOSCOPY (EGD) WITH PROPOFOL;  Surgeon: Arta Silence, MD;  Location: [facility];  Service: Endoscopy;  Laterality: N/A;  . EXCISION HAGLUND'S DEFORMITY WITH ACHILLES TENDON REPAIR Right 02/25/2013   Procedure: RIGHT ACHILLES DEBRIDEMENT AND RECONSTRUCTION;  HAGLUND'S EXCISION; GASTROC RECESSION AND FLEXOR HALLUCIS LONGUS TRANSFER;  Surgeon: Wylene Simmer, MD;  Location: [facility];  Service: Orthopedics;  Laterality: Right;  . JOINT REPLACEMENT     hip  . LUMBAR LAMINECTOMY     X 3  . neck fusion    . OVARIAN CYST SURGERY Left 1998  . PILONIDAL CYST EXCISION  1994  . RIGHT OOPHORECTOMY    . STERIOD INJECTION Left 01/12/2016   Procedure: STEROID INJECTION LEFT WRIST;  Surgeon: Roseanne Kaufman, MD;  Location: [facility];  Service: Orthopedics;  Laterality: Left;  . TOTAL HIP ARTHROPLASTY Left 03/19/2019   Procedure: LEFT TOTAL HIP ARTHROPLASTY ANTERIOR APPROACH;  Surgeon: Mcarthur Rossetti, MD;  Location: [facility];  Service: Orthopedics;  Laterality: Left;  . TOTAL THYROIDECTOMY  2003  . UPPER GI ENDOSCOPY  01/19/2015   Family History  Problem Relation Age of Onset  . Heart disease Father   . Depression Father   . Heart attack Father   . Sudden death Father   . Anxiety disorder Father   . Bipolar disorder Father   . Obesity Father   . Cancer Brother        THROAT cancer  . Depression Brother   . Anxiety disorder Brother   . Depression Sister   . Breast cancer Mother   . Hypertension Mother   . Cancer Mother   . Depression Mother   . Sleep apnea Mother   . Obesity Mother   . Depression Maternal Grandmother   . Stroke Maternal Grandfather   . Colon cancer Brother   . Pancreatic cancer Neg Hx   . Stomach cancer Neg Hx   . Liver disease Neg Hx    Social History    Tobacco Use  . Smoking status: Former Smoker    Packs/day: 0.50    Years: 15.00    Pack years: 7.50    Types: Cigarettes    Quit date: 02/2019    Years since quitting: 1.8  . Smokeless tobacco: Never Used  Vaping Use  . Vaping Use: Never used  Substance Use Topics  . Alcohol use: No    Alcohol/week: 0.0 standard drinks    Comment: Has not had any alcohol since 07/2014 - before this date, she rarely drink.  . Drug use: No   Current Outpatient Medications  Medication Sig Dispense Refill  . ALPRAZolam (XANAX XR) 0.5 MG 24 hr tablet TAKE 1 TABLET BY MOUTH 2 TIMES DAILY 180 tablet  0  . ARIPiprazole (ABILIFY) 10 MG tablet Take 10 mg by mouth at bedtime.    . Blood Glucose Monitoring Suppl (FREESTYLE LITE) w/Device KIT USE TO CHECK BLOOD SUGAR ONCE A DAY 1 kit 0  . Blood Glucose Monitoring Suppl (FREESTYLE LITE) w/Device KIT USE TO CHECK BLOOD SUGAR ONCE A DAY 1 kit 0  . Carbamazepine (EQUETRO) 300 MG CP12 Take 2 capsules by mouth at bedtime. Take 2 capsules by mouth daily at bedtime.    . clobetasol (TEMOVATE) 0.05 % GEL APPLY TOPICALLY 2 (TWO) TIMES DAILY DRY AREAS FIRST AND THEN APPLY IN THIN LAYER. BE STILL (NO DRINKING, EATING, OR SPEAKING FOR 20 MINUTES 60 g 1  . clonazePAM (KLONOPIN) 1 MG tablet Take 1 mg by mouth 3 (three) times daily.   5  . cyclobenzaprine (FLEXERIL) 10 MG tablet TAKE 1 TABLET BY MOUTH 2 TIMES DAILY AS NEEDED FOR MUSCLE SPASMS 20 tablet 0  . furosemide (LASIX) 40 MG tablet TAKE 1 TABLET BY MOUTH DAILY. 30 tablet 3  . glucose blood test strip USE TO CHECK BLOOD SUGAR ONCE A DAY (Patient taking differently: USE TO CHECK BLOOD SUGAR ONCE A DAY) 100 strip 12  . hydrOXYzine (ATARAX/VISTARIL) 25 MG tablet Take 1 tablet (25 mg total) by mouth 3 (three) times daily as needed. 90 tablet 2  . ketorolac (TORADOL) 10 MG tablet TAKE 1 TABLET BY MOUTH EVERY 6 HOURS AS NEEDED 20 tablet 0  . Lancets (FREESTYLE) lancets USE TO TEST BLOOD SUGAR ONCE A DAY (Patient taking  differently: USE TO TEST BLOOD SUGAR ONCE A DAY) 100 each 0  . lidocaine (XYLOCAINE) 5 % ointment APPLY 1 G TOPICALLY 3 (THREE) TIMES DAILY BEFORE MEALS (DOESNT COME IN GEL, FAXED DR) 1063.2 g 2  . lisinopril-hydrochlorothiazide (ZESTORETIC) 10-12.5 MG tablet TAKE 1 TABLET BY MOUTH ONCE DAILY 90 tablet 1  . metFORMIN (GLUCOPHAGE-XR) 500 MG 24 hr tablet TAKE 2 TABLETS BY MOUTH ONCE A DAY WITH BREAKFAST 180 tablet 1  . NUVIGIL 200 MG TABS Take 1 tablet by mouth in the morning.    . nystatin (MYCOSTATIN/NYSTOP) powder Apply 3 times daily as directed. 60 g 3  . ondansetron (ZOFRAN-ODT) 4 MG disintegrating tablet 4 mg 2 (two) times daily as needed.    . pimecrolimus (ELIDEL) 1 % cream APPLY TOPICALLY 2 TIMES DAILY TO COMPLETELY DRY AREA PLACE A THIN LAYER ONTO THE MOST SORE AREAS PRIOR TO BED. DO NOT RINSE 60 g 0  . RABEprazole (ACIPHEX) 20 MG tablet Take 1 tablet (20 mg total) by mouth in the morning and at bedtime. 180 tablet 1  . rizatriptan (MAXALT) 10 MG tablet Take 1 tablet (10 mg total) by mouth as needed for migraine. May repeat in 2 hours if needed 10 tablet 0  . rosuvastatin (CRESTOR) 20 MG tablet TAKE 1 TABLET BY MOUTH DAILY. 90 tablet 3  . sertraline (ZOLOFT) 100 MG tablet TAKE 2 TABLETS BY MOUTH EVERY MORNING 60 tablet 0  . sucralfate (CARAFATE) 1 g tablet Take 1 tablet (1 g total) by mouth 4 (four) times daily -  with meals and at bedtime for 15 days. 60 tablet 1  . SYNTHROID 300 MCG tablet TAKE 1 TABLET BY MOUTH ONCE A DAY BEFORE BREAKFAST 90 tablet 1  . traMADol (ULTRAM) 50 MG tablet Take 1-2 tablets (50-100 mg total) by mouth 3 (three) times daily as needed for up to 5 days. 30 tablet 0  . Vitamin D, Ergocalciferol, (DRISDOL) 1.25 MG (50000 UNIT) CAPS  capsule TAKE 1 CAPSULE BY MOUTH EVERY 7 DAYS. 12 capsule 1   No current facility-administered medications for this visit.   Allergies  Allergen Reactions  . Sulfa Antibiotics Itching, Swelling and Rash  . Bupropion Hcl Itching and Rash   . Codeine Itching  . Sulfamethoxazole-Trimethoprim Rash     Review of Systems: All systems reviewed and negative except where noted in HPI.     Physical Exam:    Wt Readings from Last 3 Encounters:  12/27/20 (!) 323 lb (146.5 kg)  12/26/20 (!) 324 lb 9.6 oz (147.2 kg)  12/14/20 (!) 331 lb 1.6 oz (150.2 kg)    BP (!) 142/88   Pulse 100   Ht 5' 10"  (1.778 m)   Wt (!) 323 lb (146.5 kg)   SpO2 97%   BMI 46.35 kg/m  Constitutional:  Pleasant, in no acute distress. Psychiatric: Normal mood and affect. Behavior is normal. EENT: Pupils normal.  Conjunctivae are normal. No scleral icterus. Neck supple. No cervical LAD. Cardiovascular: Normal rate, regular rhythm. No edema Pulmonary/chest: Effort normal and breath sounds normal. No wheezing, rales or rhonchi. Abdominal: TTP in epigastrium along with  b/l lower abdomen.  No peritoneal signs.  Soft. Bowel sounds active throughout. There are no masses palpable. No hepatomegaly. Neurological: Alert and oriented to person place and time. Skin: Skin is warm and dry. No rashes noted.   ASSESSMENT AND PLAN;   1) Epigastric pain 2) Lower abdominal pain  3) Diarrhea/Change in bowel habits 4) Nausea 5) Weight loss - Plan for expedited endoscopic evaluation with EGD/colonoscopy to evaluate for mucosal/luminal pathology - Random directed biopsies during EGD/colonoscopy as above   6) GERD 7) Belching - Continue high-dose Aciphex for now - Add Pepcid 40 mg bid - Start Carafate that was recently prescribed - EGD to evaluate for erosive esophagitis, LES laxity, hiatal hernia  8) Mild microcytic anemia - While H/H in normal range and MCV/RDW are stable from 02/2020, there is a departure from 03/2020 H/H was 14/44 and MCV/RDW was 87/15. - Check iron, B12, folate - EGD/colonoscopy as above  9) Obesity 10) OSA 11) Diabetes - Plan for procedures to be done at [facility] given elevated periprocedural risks  The indications, risks, and  benefits of EGD and colonoscopy were explained to the patient in detail. Risks include but are not limited to bleeding, perforation, adverse reaction to medications, and cardiopulmonary compromise. Sequelae include but are not limited to the possibility of surgery, hospitalization, and mortality. The patient verbalized understanding and wished to proceed. All questions answered, referred to scheduler and bowel prep ordered. Further recommendations pending results of the exam.     Lavena Bullion, DO, FACG  12/27/2020, 3:14 PM   Luetta Nutting, DO

## 2020-12-28 ENCOUNTER — Other Ambulatory Visit (HOSPITAL_BASED_OUTPATIENT_CLINIC_OR_DEPARTMENT_OTHER): Payer: Self-pay

## 2020-12-28 ENCOUNTER — Other Ambulatory Visit: Payer: Self-pay

## 2020-12-28 ENCOUNTER — Other Ambulatory Visit: Payer: Self-pay | Admitting: "Endocrinology

## 2020-12-28 ENCOUNTER — Other Ambulatory Visit (INDEPENDENT_AMBULATORY_CARE_PROVIDER_SITE_OTHER): Payer: 59

## 2020-12-28 ENCOUNTER — Encounter: Payer: Self-pay | Admitting: Gastroenterology

## 2020-12-28 ENCOUNTER — Other Ambulatory Visit: Payer: Self-pay | Admitting: Family Medicine

## 2020-12-28 DIAGNOSIS — R1084 Generalized abdominal pain: Secondary | ICD-10-CM

## 2020-12-28 DIAGNOSIS — R197 Diarrhea, unspecified: Secondary | ICD-10-CM

## 2020-12-28 DIAGNOSIS — IMO0001 Reserved for inherently not codable concepts without codable children: Secondary | ICD-10-CM

## 2020-12-28 LAB — FERRITIN: Ferritin: 10.6 ng/mL (ref 10.0–291.0)

## 2020-12-28 LAB — FOLATE: Folate: 5.7 ng/mL — ABNORMAL LOW (ref >5.9)

## 2020-12-28 LAB — VITAMIN B12: Vitamin B-12: 353 pg/mL (ref 211–911)

## 2020-12-28 MED ORDER — TRESIBA FLEXTOUCH 100 UNIT/ML ~~LOC~~ SOPN
20.0000 [IU] | PEN_INJECTOR | Freq: Every day | SUBCUTANEOUS | 3 refills | Status: DC
  Filled 2020-12-28: qty 6, 30d supply, fill #0
  Filled 2021-01-31: qty 6, 30d supply, fill #1

## 2020-12-29 ENCOUNTER — Other Ambulatory Visit: Payer: Self-pay | Admitting: Family Medicine

## 2020-12-29 ENCOUNTER — Telehealth: Payer: Self-pay

## 2020-12-29 ENCOUNTER — Other Ambulatory Visit (HOSPITAL_COMMUNITY): Payer: Self-pay

## 2020-12-29 ENCOUNTER — Other Ambulatory Visit: Payer: Self-pay

## 2020-12-29 ENCOUNTER — Ambulatory Visit: Payer: 59 | Admitting: Family Medicine

## 2020-12-29 ENCOUNTER — Telehealth: Payer: Self-pay | Admitting: Adult Health

## 2020-12-29 ENCOUNTER — Other Ambulatory Visit (HOSPITAL_BASED_OUTPATIENT_CLINIC_OR_DEPARTMENT_OTHER): Payer: Self-pay

## 2020-12-29 DIAGNOSIS — D509 Iron deficiency anemia, unspecified: Secondary | ICD-10-CM

## 2020-12-29 DIAGNOSIS — E538 Deficiency of other specified B group vitamins: Secondary | ICD-10-CM

## 2020-12-29 LAB — IRON, TOTAL/TOTAL IRON BINDING CAP
%SAT: 12 % (calc) — ABNORMAL LOW (ref 16–45)
Iron: 33 ug/dL — ABNORMAL LOW (ref 45–160)
TIBC: 269 mcg/dL (calc) (ref 250–450)

## 2020-12-29 MED ORDER — UNIFINE PENTIPS 31G X 5 MM MISC
3 refills | Status: DC
  Filled 2020-12-29: qty 100, 90d supply, fill #0
  Filled 2021-05-21: qty 100, 30d supply, fill #0

## 2020-12-29 MED ORDER — FERROUS SULFATE 325 (65 FE) MG PO TBEC: 325.0000 mg | DELAYED_RELEASE_TABLET | Freq: Two times a day (BID) | ORAL | 5 refills | Status: DC

## 2020-12-29 MED ORDER — SERTRALINE HCL 100 MG PO TABS
ORAL_TABLET | Freq: Every morning | ORAL | 0 refills | Status: DC
  Filled 2020-12-29: qty 60, 30d supply, fill #0

## 2020-12-29 MED ORDER — FOLIC ACID 1 MG PO TABS: 1.0000 mg | ORAL_TABLET | Freq: Every day | ORAL | 6 refills | Status: DC

## 2020-12-29 MED ORDER — BD PEN NEEDLE MINI U/F 31G X 5 MM MISC: 3 refills | Status: DC

## 2020-12-29 NOTE — Telephone Encounter (Signed)
Patient called in needing refill for Setraline 100mg . Appt 6/1. Pharmacy [facility] [address] [place]

## 2020-12-29 NOTE — Telephone Encounter (Signed)
-----   Message from [place], DO sent at 12/29/2020  7:39 AM EDT ----- Labs notable for iron deficiency and folate deficiency. Will plan on treating as follows: -Start ferrous sulfate 325 mg p.o. twice daily.  Take with vitamin C or orange juice. -Take iron supplement 2 hours before or 4 hours after PPI or other antacids as this requires some level of gastric acidity to aid in absorption - Start folic acid 1 mg daily - Repeat iron panel, folate, and CBC in 3 months -Proceed with EGD/colonoscopy as scheduled

## 2020-12-29 NOTE — Telephone Encounter (Signed)
Rx sent 

## 2020-12-29 NOTE — Progress Notes (Signed)
b

## 2020-12-29 NOTE — Telephone Encounter (Signed)
Medications sent in and patient is made aware about the lab work that need to be done in 3 months. Recall was made for this but labs orders were placed to do in [facility] on the second floor

## 2020-12-31 ENCOUNTER — Other Ambulatory Visit: Payer: Self-pay | Admitting: Family Medicine

## 2020-12-31 DIAGNOSIS — M62831 Muscle spasm of calf: Secondary | ICD-10-CM

## 2020-12-31 DIAGNOSIS — M25572 Pain in left ankle and joints of left foot: Secondary | ICD-10-CM

## 2020-12-31 DIAGNOSIS — M79672 Pain in left foot: Secondary | ICD-10-CM

## 2021-01-02 ENCOUNTER — Other Ambulatory Visit (HOSPITAL_BASED_OUTPATIENT_CLINIC_OR_DEPARTMENT_OTHER): Payer: Self-pay

## 2021-01-03 ENCOUNTER — Other Ambulatory Visit: Payer: Self-pay

## 2021-01-03 ENCOUNTER — Encounter: Payer: Self-pay | Admitting: Adult Health

## 2021-01-03 ENCOUNTER — Ambulatory Visit (INDEPENDENT_AMBULATORY_CARE_PROVIDER_SITE_OTHER): Payer: 59 | Admitting: Adult Health

## 2021-01-03 ENCOUNTER — Other Ambulatory Visit (HOSPITAL_BASED_OUTPATIENT_CLINIC_OR_DEPARTMENT_OTHER): Payer: Self-pay

## 2021-01-03 DIAGNOSIS — F4310 Post-traumatic stress disorder, unspecified: Secondary | ICD-10-CM

## 2021-01-03 DIAGNOSIS — F411 Generalized anxiety disorder: Secondary | ICD-10-CM

## 2021-01-03 DIAGNOSIS — F410 Panic disorder [episodic paroxysmal anxiety] without agoraphobia: Secondary | ICD-10-CM | POA: Diagnosis not present

## 2021-01-03 DIAGNOSIS — F3181 Bipolar II disorder: Secondary | ICD-10-CM | POA: Diagnosis not present

## 2021-01-03 MED ORDER — CYCLOBENZAPRINE HCL 10 MG PO TABS
ORAL_TABLET | ORAL | 0 refills | Status: DC
  Filled 2021-01-03 – 2021-05-02 (×2): qty 20, 10d supply, fill #0

## 2021-01-03 NOTE — Progress Notes (Signed)
Tracy [name] [id number] [birth year] 55 y.o.  Subjective:   Patient ID:  Tracy [name] is a 55 y.o. (DOB 01-Mar-1966) female.  Chief Complaint: No chief complaint on file.   HPI Tracy [name] presents to the office today for follow-up of MDD, GAD, PTSD, and Panic attacks.  Describes mood today as "no different". Pleasant. Tearful every day - comes out of nowhere. Mood symptoms - reports depression. Feels anxious -  "more anxious in the morning" - "dry heaves". Worry and ruminating about the day - gets overwhelmed. Denies irritabilty. Diagnosed with anemia - upcoming endoscopy and colonoscopy. Everything is an effort. Staying in the bed all the time. Researching Ketamine and watching video. Decreased interest and motivation. Taking medications as prescribed.   Has tried multiple medications without any long term relief of symptoms. Has also participated in [place] x 2 and ECT. Felt like [place] was helpful initially and tried it a second time without further success. Does not feel current medications are offering symptom relief. Cuurently awaiting Spravato treatment.  Energy levels lower. Active, does not have a regular exercise routine.  Enjoys some usual interests and activities. Married - plans to divorce husband of 70 to 75 years. Siblings locally. Spending time with family. Appetite adequate. Weight loss.  Sleeping difficulties. Averages 3 to 4 fitful hours. Sleep apnea - Nuvigil in the morning.  Focus and concentration difficulties. Completing tasks. Managing aspects of household. 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 Denies SI or HI.  Denies AH or VH.   Previous medication trials:  Antipsychotics: Vraylar-TD, Latuda, Abilify, Geodon, Rexulti  Mood Stabilizers - Depakote, Lithium, Lamictal, Equetro, Topamax, Trileptal, Gabapentin  SSRI - Zoloft, Lexapro, Celexa, Prozac, Viibryd, Trintellix, Prozac  SNRI - Pristiq,  Effexor, Cymbalta  Wellbutrin - allergic reaction  Anti-anxiety - Buspar, Xanax, Clonazepam, Ativan, Valium  Sleep agents - Trazadone, Ambien, Restoril   Other - Deplin,  Emsam, Serzone, Doxepin, Nuvigil, Vistaril  Previous treatment: ECT and [place] Office Visit from 03/01/2020 in [place]  Total GAD-7 Score 11    PHQ2-9   [place] Office Visit from 10/11/2020 in [place] Office Visit from 03/01/2020 in [place] Office Visit from 06/16/2019 in [place] at [facility] Visit from 04/22/2019 in [place] at [facility] Total Score _0 0  PHQ-9 Total Score _1 Flowsheet Row ED from 10/12/2020 in [facility] [place] No Risk       Review of Systems:  Review of Systems  Musculoskeletal: Negative for gait problem.  Neurological: Negative for tremors.  Psychiatric/Behavioral:       Please refer to HPI    Medications: I have reviewed the patient's current medications.  Current Outpatient Medications  Medication Sig Dispense Refill  . cyclobenzaprine (FLEXERIL) 10 MG tablet TAKE 1 TABLET BY MOUTH 2 TIMES DAILY AS NEEDED FOR MUSCLE SPASMS 20 tablet 0  . ALPRAZolam (XANAX XR) 0.5 MG 24 hr tablet TAKE 1 TABLET BY MOUTH 2 TIMES DAILY 180 tablet 0  . ARIPiprazole (ABILIFY) 10 MG tablet Take 10 mg by mouth at bedtime.    . Blood Glucose Monitoring Suppl (FREESTYLE LITE) w/Device KIT USE TO CHECK BLOOD SUGAR ONCE A DAY 1 kit 0  .  Blood Glucose Monitoring Suppl (FREESTYLE LITE) w/Device KIT USE TO CHECK BLOOD SUGAR ONCE A DAY 1 kit 0  . Carbamazepine (EQUETRO) 300 MG CP12 Take 2 capsules by mouth at bedtime. Take 2 capsules by mouth daily at bedtime.    . clobetasol (TEMOVATE) 0.05 % GEL APPLY TOPICALLY 2 (TWO) TIMES DAILY DRY AREAS FIRST AND THEN APPLY IN THIN LAYER. BE  STILL (NO DRINKING, EATING, OR SPEAKING FOR 20 MINUTES 60 g 1  . clonazePAM (KLONOPIN) 1 MG tablet Take 1 mg by mouth 3 (three) times daily.   5  . famotidine (PEPCID) 40 MG tablet Take 1 tablet (40 mg total) by mouth 2 (two) times daily. 60 tablet 0  . ferrous sulfate 325 (65 FE) MG EC tablet Take 1 tablet (325 mg total) by mouth in the morning and at bedtime. Take iron supplement 2 hours before or 4 hours after PPI or other antacids as this requires some level of gastric acidity to aid in absorption 60 tablet 5  . folic acid (FOLVITE) 1 MG tablet Take 1 tablet (1 mg total) by mouth daily. 30 tablet 6  . furosemide (LASIX) 40 MG tablet TAKE 1 TABLET BY MOUTH DAILY. 30 tablet 3  . glucose blood test strip USE TO CHECK BLOOD SUGAR ONCE A DAY (Patient taking differently: USE TO CHECK BLOOD SUGAR ONCE A DAY) 100 strip 12  . hydrOXYzine (ATARAX/VISTARIL) 25 MG tablet Take 1 tablet (25 mg total) by mouth 3 (three) times daily as needed. 90 tablet 2  . insulin degludec (TRESIBA FLEXTOUCH) 100 UNIT/ML FlexTouch Pen Inject 20 Units into the skin daily. 6 mL 3  . Insulin Pen Needle (PENTIPS) 31G X 5 MM MISC Use to inject insulin daily 100 each 3  . ketorolac (TORADOL) 10 MG tablet TAKE 1 TABLET BY MOUTH EVERY 6 HOURS AS NEEDED 20 tablet 0  . Lancets (FREESTYLE) lancets USE TO TEST BLOOD SUGAR ONCE A DAY (Patient taking differently: USE TO TEST BLOOD SUGAR ONCE A DAY) 100 each 0  . lidocaine (XYLOCAINE) 5 % ointment APPLY 1 G TOPICALLY 3 (THREE) TIMES DAILY BEFORE MEALS (DOESNT COME IN GEL, FAXED DR) 1063.2 g 2  . lisinopril-hydrochlorothiazide (ZESTORETIC) 10-12.5 MG tablet TAKE 1 TABLET BY MOUTH ONCE DAILY 90 tablet 1  . metFORMIN (GLUCOPHAGE-XR) 500 MG 24 hr tablet TAKE 2 TABLETS BY MOUTH ONCE A DAY WITH BREAKFAST 180 tablet 1  . NUVIGIL 200 MG TABS Take 1 tablet by mouth in the morning.    . nystatin (MYCOSTATIN/NYSTOP) powder Apply 3 times daily as directed. 60 g 3  . ondansetron (ZOFRAN-ODT) 4 MG  disintegrating tablet 4 mg 2 (two) times daily as needed.    . pimecrolimus (ELIDEL) 1 % cream APPLY TOPICALLY 2 TIMES DAILY TO COMPLETELY DRY AREA PLACE A THIN LAYER ONTO THE MOST SORE AREAS PRIOR TO BED. DO NOT RINSE 60 g 0  . RABEprazole (ACIPHEX) 20 MG tablet Take 1 tablet (20 mg total) by mouth in the morning and at bedtime. 180 tablet 1  . rizatriptan (MAXALT) 10 MG tablet Take 1 tablet (10 mg total) by mouth as needed for migraine. May repeat in 2 hours if needed 10 tablet 0  . rosuvastatin (CRESTOR) 20 MG tablet TAKE 1 TABLET BY MOUTH DAILY. 90 tablet 3  . sertraline (ZOLOFT) 100 MG tablet TAKE 2 TABLETS BY MOUTH EVERY MORNING 60 tablet 0  . Sod Picosulfate-Mag Ox-Cit Acd (CLENPIQ) 10-3.5-12 MG-GM -GM/160ML SOLN Take 1 kit by mouth as directed. [place]  mL 0  . sucralfate (CARAFATE) 1 g tablet Take 1 tablet (1 g total) by mouth 4 (four) times daily -  with meals and at bedtime for 15 days. 60 tablet 1  . SYNTHROID 300 MCG tablet TAKE 1 TABLET BY MOUTH ONCE A DAY BEFORE BREAKFAST 90 tablet 1  . Vitamin D, Ergocalciferol, (DRISDOL) 1.25 MG (50000 UNIT) CAPS capsule TAKE 1 CAPSULE BY MOUTH EVERY 7 DAYS. 12 capsule 1   No current facility-administered medications for this visit.    Medication Side Effects: None  Allergies:  Allergies  Allergen Reactions  . Sulfa Antibiotics Itching, Swelling and Rash  . Bupropion Hcl Itching and Rash  . Codeine Itching  . Sulfamethoxazole-Trimethoprim Rash    Past Medical History:  Diagnosis Date  . Anxiety   . Arthritis    back, knees, right elbow  . Back pain   . Bilateral swelling of feet and ankles   . Bipolar disorder ([place])   . Borderline personality disorder ([place])   . Cancer (HCC)    oral  . Chewing difficulty   . Cirrhosis ([place])   . Constipation   . Dental crowns present   . Depression   . Diabetes mellitus without complication ([place])   . Fatty liver   . Food allergy    Walnuts  . GAD (generalized anxiety disorder)   . GERD  (gastroesophageal reflux disease)   . High cholesterol    no current med.  Marland Kitchen History of kidney stones   . History of migraine headaches   . Hypertension    under control with meds., has been on med. x 4 yr.  . Hypothyroidism   . IBS (irritable bowel syndrome)   . Infertility, female   . Joint pain   . Lichen planus   . Liver cirrhosis secondary to NASH (nonalcoholic steatohepatitis) ([place])   . NASH (nonalcoholic steatohepatitis)   . Osteoarthritis   . Other fatigue   . Palpitations   . PCOS (polycystic ovarian syndrome)   . PONV (postoperative nausea and vomiting)    also hx. of emergence delirium 2007  . Shortness of breath on exertion   . Sleep apnea    uses CPAP nightly  . Trigeminal neuralgia   . Type 2 diabetes mellitus ([place])   . Vitamin D deficiency     Past Medical History, Surgical history, Social history, and Family history were reviewed and updated as appropriate.   Please see review of systems for further details on the patient's review from today.   Objective:   Physical Exam:  There were no vitals taken for this visit.  Physical Exam Constitutional:      General: She is not in acute distress. Musculoskeletal:        General: No deformity.  Neurological:     Mental Status: She is alert and oriented to person, place, and time.     Cranial Nerves: No dysarthria.     Coordination: Coordination normal.  Psychiatric:        Attention and Perception: Attention and perception normal. She does not perceive auditory or visual hallucinations.        Mood and Affect: Mood normal. Mood is not anxious or depressed. Affect is not labile, blunt, angry or inappropriate.        Speech: Speech normal.        Behavior: Behavior normal. Behavior is cooperative.        Thought Content: Thought content normal. Thought content is not paranoid or delusional.  Thought content does not include homicidal or suicidal ideation. Thought content does not include homicidal or suicidal  plan.        Cognition and Memory: Cognition and memory normal.        Judgment: Judgment normal.     Comments: Insight intact     Lab Review:     Component Value Date/Time   NA 134 (L) 12/26/2020 0000   NA 140 10/11/2020 1041   K 4.3 12/26/2020 0000   CL 102 12/26/2020 0000   CO2 23 12/26/2020 0000   GLUCOSE 303 (H) 12/26/2020 0000   BUN 9 12/26/2020 0000   BUN 14 10/11/2020 1041   CREATININE 0.70 12/26/2020 0000   CALCIUM 9.6 12/26/2020 0000   PROT 7.2 12/26/2020 0000   PROT 7.1 10/11/2020 1041   ALBUMIN 4.2 10/11/2020 1041   AST 42 (H) 12/26/2020 0000   ALT 34 (H) 12/26/2020 0000   ALKPHOS 126 (H) 10/11/2020 1041   BILITOT 0.2 12/26/2020 0000   BILITOT <0.2 10/11/2020 1041   GFRNONAA 99 12/26/2020 0000   GFRAA 115 12/26/2020 0000       Component Value Date/Time   WBC 4.1 12/26/2020 0000   RBC 5.19 (H) 12/26/2020 0000   HGB 12.2 12/26/2020 0000   HGB 10.9 (L) 10/11/2020 1041   HCT 41.0 12/26/2020 0000   HCT 35.6 10/11/2020 1041   PLT 225 12/26/2020 0000   PLT 224 10/11/2020 1041   MCV 79.0 (L) 12/26/2020 0000   MCV 78 (L) 10/11/2020 1041   MCH 23.5 (L) 12/26/2020 0000   MCHC 29.8 (L) 12/26/2020 0000   RDW 16.3 (H) 12/26/2020 0000   RDW 17.5 (H) 10/11/2020 1041   LYMPHSABS 1,476 12/26/2020 0000   LYMPHSABS 1.7 10/11/2020 1041   MONOABS 0.3 09/08/2019 1032   EOSABS 127 12/26/2020 0000   EOSABS 0.2 10/11/2020 1041   BASOSABS 29 12/26/2020 0000   BASOSABS 0.0 10/11/2020 1041    No results found for: POCLITH, LITHIUM   No results found for: PHENYTOIN, PHENOBARB, VALPROATE, CBMZ   .res Assessment: Plan:     Plan:  PDMP reviewed  1. Lamictal 267m daily 2. Zoloft 1052m- 2 daily 3. Nuvigil 20066maily 4. Abilify 46m30mily 5. Clonazepam 1mg 59mXanax XR 0.5mg  8mEquetro 300mg B52m take one capsule a day a week and d/c.  8. Hydroxyzine 25mg TI57musig 1 tablet at bedtime.   Will plan to start eliminating medications  No scripts needed  today  [facility]  Discussed Spravato as a treatment option - patient given literature and will review. Met with nurse to discuss. Patient willing to met to discuss further and is open to all options.  Patient advised to contact office with any questions, adverse effects, or acute worsening in signs and symptoms.  Counseled patient regarding potential benefits, risks, and side effects of Lamictal to include potential risk of Stevens-Johnson syndrome. Advised patient to stop taking Lamictal and contact office immediately if rash develops and to seek urgent medical attention if rash is severe and/or spreading quickly.   Discussed potential benefits, risk, and side effects of benzodiazepines to include potential risk of tolerance and dependence, as well as possible drowsiness.  Advised patient not to drive if experiencing drowsiness and to take lowest possible effective dose to minimize risk of dependence and tolerance.  Discussed potential metabolic side effects associated with atypical antipsychotics, as well as potential risk for movement side effects. Advised pt to  contact office if movement side effects occur.     Diagnoses and all orders for this visit:  GAD (generalized anxiety disorder)  Panic attacks  Bipolar II disorder (HCC)  PTSD (post-traumatic stress disorder)     Please see After Visit Summary for patient specific instructions.  Future Appointments  Date Time Provider [place]  01/05/2021  9:40 AM [facility]ENING [facility] None  01/31/2021 12:00 PM Verlee Pope, Berdie Ogren, NP CP-CP None    No orders of the defined types were placed in this encounter.   -------------------------------

## 2021-01-04 ENCOUNTER — Other Ambulatory Visit (HOSPITAL_BASED_OUTPATIENT_CLINIC_OR_DEPARTMENT_OTHER): Payer: Self-pay

## 2021-01-05 ENCOUNTER — Other Ambulatory Visit (HOSPITAL_BASED_OUTPATIENT_CLINIC_OR_DEPARTMENT_OTHER): Payer: Self-pay

## 2021-01-05 ENCOUNTER — Other Ambulatory Visit (HOSPITAL_COMMUNITY)
Admission: RE | Admit: 2021-01-05 | Discharge: 2021-01-05 | Disposition: A | Discharge status: Home / Self Care | Payer: 59 | Source: Ambulatory Visit | Attending: Gastroenterology | Admitting: Gastroenterology

## 2021-01-05 DIAGNOSIS — Z20822 Contact with and (suspected) exposure to covid-19: Secondary | ICD-10-CM | POA: Diagnosis not present

## 2021-01-05 DIAGNOSIS — Z01812 Encounter for preprocedural laboratory examination: Secondary | ICD-10-CM | POA: Diagnosis not present

## 2021-01-05 LAB — SARS CORONAVIRUS 2 (TAT 6-24 HRS): SARS Coronavirus 2: NEGATIVE

## 2021-01-08 ENCOUNTER — Other Ambulatory Visit (HOSPITAL_BASED_OUTPATIENT_CLINIC_OR_DEPARTMENT_OTHER): Payer: Self-pay

## 2021-01-09 ENCOUNTER — Other Ambulatory Visit (HOSPITAL_BASED_OUTPATIENT_CLINIC_OR_DEPARTMENT_OTHER): Payer: Self-pay

## 2021-01-09 MED FILL — Rosuvastatin Calcium Tab 20 MG: ORAL | 90 days supply | Qty: 90 | Fill #0 | Status: CN

## 2021-01-10 ENCOUNTER — Ambulatory Visit (HOSPITAL_COMMUNITY)
Admission: RE | Admit: 2021-01-10 | Discharge: 2021-01-10 | Disposition: A | Discharge status: Home / Self Care | Payer: 59 | Attending: Gastroenterology | Admitting: Gastroenterology

## 2021-01-10 ENCOUNTER — Encounter (HOSPITAL_COMMUNITY)
Admission: RE | Disposition: A | Discharge status: Home / Self Care | Payer: Self-pay | Source: Home / Self Care | Attending: Gastroenterology

## 2021-01-10 ENCOUNTER — Ambulatory Visit (HOSPITAL_COMMUNITY): Payer: 59 | Admitting: Anesthesiology

## 2021-01-10 ENCOUNTER — Encounter (HOSPITAL_COMMUNITY): Payer: Self-pay | Admitting: Gastroenterology

## 2021-01-10 ENCOUNTER — Other Ambulatory Visit (HOSPITAL_BASED_OUTPATIENT_CLINIC_OR_DEPARTMENT_OTHER): Payer: Self-pay

## 2021-01-10 ENCOUNTER — Other Ambulatory Visit: Payer: Self-pay

## 2021-01-10 DIAGNOSIS — Z79890 Hormone replacement therapy (postmenopausal): Secondary | ICD-10-CM | POA: Insufficient documentation

## 2021-01-10 DIAGNOSIS — K5730 Diverticulosis of large intestine without perforation or abscess without bleeding: Secondary | ICD-10-CM | POA: Insufficient documentation

## 2021-01-10 DIAGNOSIS — G4730 Sleep apnea, unspecified: Secondary | ICD-10-CM | POA: Insufficient documentation

## 2021-01-10 DIAGNOSIS — Z882 Allergy status to sulfonamides status: Secondary | ICD-10-CM | POA: Diagnosis not present

## 2021-01-10 DIAGNOSIS — K2970 Gastritis, unspecified, without bleeding: Secondary | ICD-10-CM | POA: Diagnosis not present

## 2021-01-10 DIAGNOSIS — I10 Essential (primary) hypertension: Secondary | ICD-10-CM | POA: Insufficient documentation

## 2021-01-10 DIAGNOSIS — R194 Change in bowel habit: Secondary | ICD-10-CM | POA: Diagnosis not present

## 2021-01-10 DIAGNOSIS — K2990 Gastroduodenitis, unspecified, without bleeding: Secondary | ICD-10-CM

## 2021-01-10 DIAGNOSIS — K7581 Nonalcoholic steatohepatitis (NASH): Secondary | ICD-10-CM | POA: Diagnosis not present

## 2021-01-10 DIAGNOSIS — K648 Other hemorrhoids: Secondary | ICD-10-CM | POA: Insufficient documentation

## 2021-01-10 DIAGNOSIS — Z87891 Personal history of nicotine dependence: Secondary | ICD-10-CM | POA: Insufficient documentation

## 2021-01-10 DIAGNOSIS — G43909 Migraine, unspecified, not intractable, without status migrainosus: Secondary | ICD-10-CM | POA: Diagnosis not present

## 2021-01-10 DIAGNOSIS — Z8349 Family history of other endocrine, nutritional and metabolic diseases: Secondary | ICD-10-CM | POA: Insufficient documentation

## 2021-01-10 DIAGNOSIS — K644 Residual hemorrhoidal skin tags: Secondary | ICD-10-CM | POA: Diagnosis not present

## 2021-01-10 DIAGNOSIS — E119 Type 2 diabetes mellitus without complications: Secondary | ICD-10-CM | POA: Diagnosis not present

## 2021-01-10 DIAGNOSIS — R112 Nausea with vomiting, unspecified: Secondary | ICD-10-CM

## 2021-01-10 DIAGNOSIS — G4733 Obstructive sleep apnea (adult) (pediatric): Secondary | ICD-10-CM

## 2021-01-10 DIAGNOSIS — Z803 Family history of malignant neoplasm of breast: Secondary | ICD-10-CM | POA: Diagnosis not present

## 2021-01-10 DIAGNOSIS — R142 Eructation: Secondary | ICD-10-CM

## 2021-01-10 DIAGNOSIS — Z8249 Family history of ischemic heart disease and other diseases of the circulatory system: Secondary | ICD-10-CM | POA: Diagnosis not present

## 2021-01-10 DIAGNOSIS — K52839 Microscopic colitis, unspecified: Secondary | ICD-10-CM | POA: Diagnosis not present

## 2021-01-10 DIAGNOSIS — K219 Gastro-esophageal reflux disease without esophagitis: Secondary | ICD-10-CM | POA: Insufficient documentation

## 2021-01-10 DIAGNOSIS — Z800 Family history of malignant neoplasm of digestive organs: Secondary | ICD-10-CM | POA: Insufficient documentation

## 2021-01-10 DIAGNOSIS — R110 Nausea: Secondary | ICD-10-CM | POA: Diagnosis not present

## 2021-01-10 DIAGNOSIS — Z79899 Other long term (current) drug therapy: Secondary | ICD-10-CM | POA: Insufficient documentation

## 2021-01-10 DIAGNOSIS — Z881 Allergy status to other antibiotic agents status: Secondary | ICD-10-CM | POA: Diagnosis not present

## 2021-01-10 DIAGNOSIS — E890 Postprocedural hypothyroidism: Secondary | ICD-10-CM | POA: Diagnosis not present

## 2021-01-10 DIAGNOSIS — Z885 Allergy status to narcotic agent status: Secondary | ICD-10-CM | POA: Diagnosis not present

## 2021-01-10 DIAGNOSIS — Z791 Long term (current) use of non-steroidal anti-inflammatories (NSAID): Secondary | ICD-10-CM | POA: Diagnosis not present

## 2021-01-10 DIAGNOSIS — Z85850 Personal history of malignant neoplasm of thyroid: Secondary | ICD-10-CM | POA: Diagnosis not present

## 2021-01-10 DIAGNOSIS — E669 Obesity, unspecified: Secondary | ICD-10-CM | POA: Diagnosis not present

## 2021-01-10 DIAGNOSIS — Z7984 Long term (current) use of oral hypoglycemic drugs: Secondary | ICD-10-CM | POA: Insufficient documentation

## 2021-01-10 DIAGNOSIS — R1084 Generalized abdominal pain: Secondary | ICD-10-CM | POA: Diagnosis not present

## 2021-01-10 DIAGNOSIS — K2950 Unspecified chronic gastritis without bleeding: Secondary | ICD-10-CM | POA: Diagnosis not present

## 2021-01-10 DIAGNOSIS — K641 Second degree hemorrhoids: Secondary | ICD-10-CM

## 2021-01-10 DIAGNOSIS — Z823 Family history of stroke: Secondary | ICD-10-CM | POA: Insufficient documentation

## 2021-01-10 DIAGNOSIS — R197 Diarrhea, unspecified: Secondary | ICD-10-CM

## 2021-01-10 DIAGNOSIS — R634 Abnormal weight loss: Secondary | ICD-10-CM | POA: Diagnosis not present

## 2021-01-10 HISTORY — PX: COLONOSCOPY WITH PROPOFOL: SHX5780

## 2021-01-10 HISTORY — PX: ESOPHAGOGASTRODUODENOSCOPY (EGD) WITH PROPOFOL: SHX5813

## 2021-01-10 LAB — GLUCOSE, CAPILLARY: Glucose-Capillary: 99 mg/dL (ref 70–99)

## 2021-01-10 SURGERY — COLONOSCOPY WITH PROPOFOL
Anesthesia: Monitor Anesthesia Care

## 2021-01-10 MED ORDER — LACTATED RINGERS IV SOLN
INTRAVENOUS | Status: AC | PRN
  Administered 2021-01-10: 20 mL/h via INTRAVENOUS

## 2021-01-10 MED ORDER — PROPOFOL 500 MG/50ML IV EMUL
INTRAVENOUS | Status: DC | PRN
  Administered 2021-01-10: 145 ug/kg/min via INTRAVENOUS

## 2021-01-10 MED ORDER — SODIUM CHLORIDE 0.9 % IV SOLN: INTRAVENOUS | Status: DC

## 2021-01-10 MED ORDER — PROPOFOL 10 MG/ML IV BOLUS
INTRAVENOUS | Status: DC | PRN
  Administered 2021-01-10: 40 mg via INTRAVENOUS
  Administered 2021-01-10: 60 mg via INTRAVENOUS

## 2021-01-10 MED ORDER — LIDOCAINE HCL (CARDIAC) PF 100 MG/5ML IV SOSY
PREFILLED_SYRINGE | INTRAVENOUS | Status: DC | PRN
  Administered 2021-01-10: 100 mg via INTRAVENOUS

## 2021-01-10 SURGICAL SUPPLY — 25 items

## 2021-01-10 NOTE — Op Note (Signed)
[facility] Patient Name: Tracy [name] Procedure Date: 01/10/2021 MRN: [id number] Attending MD: Gerrit Heck , MD Date of Birth: [birth year] CSN: [id number] Age: 55 Admit Type: Inpatient Procedure:                Colonoscopy Indications:              Generalized abdominal pain, Change in bowel habits,                            Diarrhea Providers:                Gerrit Heck, MD, Mikey College, RN, Ladona Ridgel, Technician, Stephanie [place] CRNA Referring MD:              Medicines:                Monitored Anesthesia Care Complications:            No immediate complications. Estimated Blood Loss:     Estimated blood loss was minimal. Procedure:                Pre-Anesthesia Assessment:                           - Prior to the procedure, a History and Physical                            was performed, and patient medications and                            allergies were reviewed. The patient's tolerance of                            previous anesthesia was also reviewed. The risks                            and benefits of the procedure and the sedation                            options and risks were discussed with the patient.                            All questions were answered, and informed consent                            was obtained. Prior Anticoagulants: The patient has                            taken no previous anticoagulant or antiplatelet                            agents. ASA Grade Assessment: III - A patient with  severe systemic disease. After reviewing the risks                            and benefits, the patient was deemed in                            satisfactory condition to undergo the procedure.                           After obtaining informed consent, the colonoscope                            was passed under direct vision. Throughout the                             procedure, the patient's blood pressure, pulse, and                            oxygen saturations were monitored continuously. The                            CF-HQ190L ([id number]) Olympus colonoscope was                            introduced through the anus and advanced to the the                            terminal ileum. The colonoscopy was performed                            without difficulty. The patient tolerated the                            procedure well. The quality of the bowel                            preparation was good. The terminal ileum, ileocecal                            valve, appendiceal orifice, and rectum were                            photographed. Scope In: 10:43:07 AM Scope Out: 11:03:10 AM Scope Withdrawal Time: 0 hours 14 minutes 22 seconds  Total Procedure Duration: 0 hours 20 minutes 3 seconds  Findings:      Hemorrhoids were found on perianal exam.      A few small-mouthed diverticula were found in the sigmoid colon and       cecum.      Normal mucosa was found in the entire colon. Biopsies for histology were       taken with a cold forceps from the right colon and left colon for       evaluation of microscopic colitis. Estimated blood loss was minimal.      Non-bleeding internal hemorrhoids were found during retroflexion. The  hemorrhoids were medium-sized.      The terminal ileum appeared normal. Impression:               - Hemorrhoids found on perianal exam.                           - Diverticulosis in the sigmoid colon and in the                            cecum.                           - Normal mucosa in the entire examined colon.                            Biopsied.                           - Grade 2-3, non-bleeding internal hemorrhoids.                           - The examined portion of the ileum was normal. Moderate Sedation:      Not Applicable - Patient had care per Anesthesia. Recommendation:           - Patient has a contact  number available for                            emergencies. The signs and symptoms of potential                            delayed complications were discussed with the                            patient. Return to normal activities tomorrow.                            Written discharge instructions were provided to the                            patient.                           - Resume previous diet.                           - Continue present medications.                           - Await pathology results.                           - Repeat colonoscopy in 10 years for screening                            purposes.                           -  Return to GI office PRN.                           - Use fiber, for example Citrucel, Fibercon, Konsyl                            or Metamucil.                           - Internal hemorrhoids were noted on this study and                            may be amenable to hemorrhoid band ligation. If you                            are interested in further treatment of these                            hemorrhoids with band ligation, please contact my                            clinic to set up an appointment for evaluation and                            treatment. Procedure Code(s):        --- Professional ---                           [phone], Colonoscopy, flexible; with biopsy, single                            or multiple Diagnosis Code(s):        --- Professional ---                           K64.8, Other hemorrhoids                           R10.84, Generalized abdominal pain                           R19.4, Change in bowel habit                           R19.7, Diarrhea, unspecified                           K57.30, Diverticulosis of large intestine without                            perforation or abscess without bleeding CPT copyright 2019 American Medical Association. All rights reserved. The codes documented in this report are  preliminary and upon coder review may  be revised to meet current compliance requirements. Gerrit Heck, MD 01/10/2021 11:14:10 AM Number of Addenda: 0

## 2021-01-10 NOTE — Op Note (Signed)
[facility] Patient Name: Tracy [name] Procedure Date: 01/10/2021 MRN: [id number] Attending MD: Gerrit Heck , MD Date of Birth: 26-Mar-1966 CSN: [id number] Age: 55 Admit Type: Outpatient Procedure:                Upper GI endoscopy Indications:              Epigastric abdominal pain, Generalized abdominal                            pain, Heartburn, Suspected esophageal reflux,                            Diarrhea, Eructation, Nausea, Weight loss Providers:                Gerrit Heck, MD, Mikey College, RN, Ladona Ridgel, Technician, Stephanie [place] CRNA Referring MD:              Medicines:                Monitored Anesthesia Care Complications:            No immediate complications. Estimated Blood Loss:     Estimated blood loss was minimal. Procedure:                Pre-Anesthesia Assessment:                           - Prior to the procedure, a History and Physical                            was performed, and patient medications and                            allergies were reviewed. The patient's tolerance of                            previous anesthesia was also reviewed. The risks                            and benefits of the procedure and the sedation                            options and risks were discussed with the patient.                            All questions were answered, and informed consent                            was obtained. Prior Anticoagulants: The patient has                            taken no previous anticoagulant or antiplatelet  agents. ASA Grade Assessment: III - A patient with                            severe systemic disease. After reviewing the risks                            and benefits, the patient was deemed in                            satisfactory condition to undergo the procedure.                           After obtaining informed consent, the endoscope  was                            passed under direct vision. Throughout the                            procedure, the patient's blood pressure, pulse, and                            oxygen saturations were monitored continuously. The                            [id number]-[id number] ([id number]) Olympus gastroscope was                            introduced through the mouth, and advanced to the                            second part of duodenum. The upper GI endoscopy was                            accomplished without difficulty. The patient                            tolerated the procedure well. Scope In: Scope Out: Findings:      The examined esophagus was normal.      Minimal inflammation characterized by erythema was found in the gastric       fundus, in the gastric body and in the gastric antrum. Biopsies were       taken with a cold forceps for Helicobacter pylori testing. Estimated       blood loss was minimal.      The examined duodenum was normal. Biopsies for histology were taken with       a cold forceps for evaluation of celiac disease. Estimated blood loss       was minimal. Impression:               - Normal esophagus.                           - Gastritis. Biopsied.                           - Normal  examined duodenum. Biopsied. Moderate Sedation:      Not Applicable - Patient had care per Anesthesia. Recommendation:           - Patient has a contact number available for                            emergencies. The signs and symptoms of potential                            delayed complications were discussed with the                            patient. Return to normal activities tomorrow.                            Written discharge instructions were provided to the                            patient.                           - Resume previous diet.                           - Continue present medications.                           - Await pathology results.                            - Perform a colonoscopy today. Procedure Code(s):        --- Professional ---                           [phone], Esophagogastroduodenoscopy, flexible,                            transoral; with biopsy, single or multiple Diagnosis Code(s):        --- Professional ---                           K29.70, Gastritis, unspecified, without bleeding                           R10.13, Epigastric pain                           R10.84, Generalized abdominal pain                           R12, Heartburn                           R19.7, Diarrhea, unspecified                           R14.2, Eructation  R11.0, Nausea                           R63.4, Abnormal weight loss CPT copyright 2019 American Medical Association. All rights reserved. The codes documented in this report are preliminary and upon coder review may  be revised to meet current compliance requirements. Gerrit Heck, MD 01/10/2021 11:10:42 AM Number of Addenda: 0

## 2021-01-10 NOTE — Discharge Instructions (Signed)
YOU HAD AN ENDOSCOPIC PROCEDURE TODAY: Refer to the procedure report and other information in the discharge instructions given to you for any specific questions about what was found during the examination. If this information does not answer your questions, please call [facility] office at [phone] to clarify.  ° °YOU SHOULD EXPECT: Some feelings of bloating in the abdomen. Passage of more gas than usual. Walking can help get rid of the air that was put into your GI tract during the procedure and reduce the bloating. If you had a lower endoscopy (such as a colonoscopy or flexible sigmoidoscopy) you may notice spotting of blood in your stool or on the toilet paper. Some abdominal soreness may be present for a day or two, also. ° °DIET: Your first meal following the procedure should be a light meal and then it is ok to progress to your normal diet. A half-sandwich or bowl of soup is an example of a good first meal. Heavy or fried foods are harder to digest and may make you feel nauseous or bloated. Drink plenty of fluids but you should avoid alcoholic beverages for 24 hours. If you had a esophageal dilation, please see attached instructions for diet.   ° °ACTIVITY: Your care partner should take you home directly after the procedure. You should plan to take it easy, moving slowly for the rest of the day. You can resume normal activity the day after the procedure however YOU SHOULD NOT DRIVE, use power tools, machinery or perform tasks that involve climbing or major physical exertion for 24 hours (because of the sedation medicines used during the test).  ° °SYMPTOMS TO REPORT IMMEDIATELY: °A gastroenterologist can be reached at any hour. Please call [phone]  for any of the following symptoms:  °Following lower endoscopy (colonoscopy, flexible sigmoidoscopy) °Excessive amounts of blood in the stool  °Significant tenderness, worsening of abdominal pains  °Swelling of the abdomen that is new, acute  °Fever of 100° or  higher  °Following upper endoscopy (EGD, EUS, ERCP, esophageal dilation) °Vomiting of blood or coffee ground material  °New, significant abdominal pain  °New, significant chest pain or pain under the shoulder blades  °Painful or persistently difficult swallowing  °New shortness of breath  °Black, tarry-looking or red, bloody stools ° °FOLLOW UP:  °If any biopsies were taken you will be contacted by phone or by letter within the next 1-3 weeks. Call [phone]  if you have not heard about the biopsies in 3 weeks.  °Please also call with any specific questions about appointments or follow up tests. ° °

## 2021-01-10 NOTE — Anesthesia Postprocedure Evaluation (Signed)
Anesthesia Post Note  Patient: KI[place] CLUM  Procedure(s) Performed: COLONOSCOPY WITH PROPOFOL (N/A ) ESOPHAGOGASTRODUODENOSCOPY (EGD) WITH PROPOFOL (N/A )     Patient location during evaluation: PACU Anesthesia Type: MAC Level of consciousness: awake Pain management: pain level controlled Vital Signs Assessment: post-procedure vital signs reviewed and stable Respiratory status: spontaneous breathing, nonlabored ventilation, respiratory function stable and patient connected to nasal cannula oxygen Cardiovascular status: stable and blood pressure returned to baseline Postop Assessment: no apparent nausea or vomiting Anesthetic complications: no   No complications documented.  Last Vitals:  Vitals:   01/10/21 1119 01/10/21 1120  BP:  (!) 117/49  Pulse: 84 81  Resp: 17 13  Temp:    SpO2: 99% 100%    Last Pain:  Vitals:   01/10/21 1118  TempSrc:   PainSc: 0-No pain                 Grady Mohabir P Anthonette Lesage

## 2021-01-10 NOTE — Transfer of Care (Signed)
Immediate Anesthesia Transfer of Care Note  Patient: Tracy [name]  Procedure(s) Performed: COLONOSCOPY WITH PROPOFOL (N/A ) ESOPHAGOGASTRODUODENOSCOPY (EGD) WITH PROPOFOL (N/A )  Patient Location: PACU  Anesthesia Type:MAC  Level of Consciousness: awake, alert  and oriented  Airway & Oxygen Therapy: Patient Spontanous Breathing  Post-op Assessment: Report given to RN and Post -op Vital signs reviewed and stable  Post vital signs: Reviewed and stable  Last Vitals:  Vitals Value Taken Time  BP 114/60 01/10/21 1110  Temp    Pulse 88 01/10/21 1110  Resp 15 01/10/21 1110  SpO2 99 % 01/10/21 1110  Vitals shown include unvalidated device data.  Last Pain:  Vitals:   01/10/21 0932  TempSrc: Oral  PainSc: 5          Complications: No complications documented.

## 2021-01-10 NOTE — Interval H&P Note (Signed)
History and Physical Interval Note:  01/10/2021 10:03 AM  Tracy [name]  has presented today for surgery, with the diagnosis of Obesity, OSA,Gerd, GAD, Nausea, Diarrhea.  The various methods of treatment have been discussed with the patient and family. After consideration of risks, benefits and other options for treatment, the patient has consented to  Procedure(s): COLONOSCOPY WITH PROPOFOL (N/A) ESOPHAGOGASTRODUODENOSCOPY (EGD) WITH PROPOFOL (N/A) as a surgical intervention.  The patient's history has been reviewed, patient examined, no change in status, stable for surgery.  I have reviewed the patient's chart and labs.  Questions were answered to the patient's satisfaction.     [name]

## 2021-01-10 NOTE — Anesthesia Preprocedure Evaluation (Addendum)
Anesthesia Evaluation  Patient identified by MRN, date of birth, ID band Patient awake    Reviewed: Allergy & Precautions, NPO status , Patient's Chart, lab work & pertinent test results  History of Anesthesia Complications (+) PONV and history of anesthetic complications  Airway Mallampati: II  TM Distance: >3 FB Neck ROM: Full    Dental no notable dental hx.    Pulmonary sleep apnea and Continuous Positive Airway Pressure Ventilation , former smoker,    Pulmonary exam normal breath sounds clear to auscultation       Cardiovascular hypertension, Pt. on medications Normal cardiovascular exam Rhythm:Regular Rate:Normal  ECG: SR, rate 79   Neuro/Psych  Headaches, PSYCHIATRIC DISORDERS Anxiety Depression Bipolar Disorder  Neuromuscular disease    GI/Hepatic GERD  Medicated,(+) Cirrhosis       , Hepatitis -NASH (nonalcoholic steatohepatitis)   Endo/Other  diabetes, Insulin DependentHypothyroidism Morbid obesityPCOS (polycystic ovarian syndrome)  Renal/GU negative Renal ROS     Musculoskeletal  (+) Arthritis ,   Abdominal (+) + obese,   Peds  Hematology negative hematology ROS (+)   Anesthesia Other Findings Obesity, OSA,Gerd, GAD, Nausea, Diarrhea  Reproductive/Obstetrics                            Anesthesia Physical Anesthesia Plan  ASA: III  Anesthesia Plan: MAC   Post-op Pain Management:    Induction: Intravenous  PONV Risk Score and Plan: 3 and Propofol infusion and Treatment may vary due to age or medical condition  Airway Management Planned: Simple Face Mask  Additional Equipment:   Intra-op Plan:   Post-operative Plan:   Informed Consent: I have reviewed the patients History and Physical, chart, labs and discussed the procedure including the risks, benefits and alternatives for the proposed anesthesia with the patient or authorized representative who has indicated  his/her understanding and acceptance.     Dental advisory given  Plan Discussed with: CRNA  Anesthesia Plan Comments:         Anesthesia Quick Evaluation

## 2021-01-11 ENCOUNTER — Telehealth: Payer: Self-pay | Admitting: Adult Health

## 2021-01-11 DIAGNOSIS — F3181 Bipolar II disorder: Secondary | ICD-10-CM | POA: Diagnosis not present

## 2021-01-11 LAB — SURGICAL PATHOLOGY

## 2021-01-11 NOTE — Telephone Encounter (Signed)
Contacted pt and discussed update with [facility] treatments, and reassured her the construction should be done end of June and she should hopefully be starting in July. Explained the process of her insurance and how that works. She felt reassured with an update, she said it gives her hope to get started soon and she understands the delay.   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 I did not fill in any dates or time off. Will hold until closer to treatments starting.

## 2021-01-11 NOTE — Telephone Encounter (Signed)
Pt called and left a message asking to speak to Tracy [name] about [facility] treatments. She is a pt of [facility] and she is interested in this treatment. Please give her a call at [phone]. She said that she called over 24 hours ago and has not received a call back.

## 2021-01-12 ENCOUNTER — Other Ambulatory Visit (HOSPITAL_BASED_OUTPATIENT_CLINIC_OR_DEPARTMENT_OTHER): Payer: Self-pay

## 2021-01-12 ENCOUNTER — Encounter (HOSPITAL_COMMUNITY): Payer: Self-pay | Admitting: Gastroenterology

## 2021-01-12 NOTE — Telephone Encounter (Signed)
Noted  

## 2021-01-15 ENCOUNTER — Encounter: Payer: Self-pay | Admitting: Gastroenterology

## 2021-01-17 ENCOUNTER — Other Ambulatory Visit (HOSPITAL_BASED_OUTPATIENT_CLINIC_OR_DEPARTMENT_OTHER): Payer: Self-pay

## 2021-01-17 DIAGNOSIS — F3181 Bipolar II disorder: Secondary | ICD-10-CM | POA: Diagnosis not present

## 2021-01-19 ENCOUNTER — Other Ambulatory Visit (HOSPITAL_BASED_OUTPATIENT_CLINIC_OR_DEPARTMENT_OTHER): Payer: Self-pay

## 2021-01-23 ENCOUNTER — Other Ambulatory Visit (HOSPITAL_COMMUNITY): Payer: Self-pay

## 2021-01-23 MED FILL — Rosuvastatin Calcium Tab 20 MG: ORAL | 90 days supply | Qty: 90 | Fill #0 | Status: AC

## 2021-01-24 ENCOUNTER — Other Ambulatory Visit (HOSPITAL_COMMUNITY): Payer: Self-pay

## 2021-01-31 ENCOUNTER — Other Ambulatory Visit: Payer: Self-pay | Admitting: Adult Health

## 2021-01-31 ENCOUNTER — Ambulatory Visit: Payer: 59 | Admitting: Adult Health

## 2021-01-31 ENCOUNTER — Other Ambulatory Visit (HOSPITAL_COMMUNITY): Payer: Self-pay

## 2021-01-31 ENCOUNTER — Encounter: Payer: Self-pay | Admitting: Adult Health

## 2021-01-31 ENCOUNTER — Ambulatory Visit: Payer: 59 | Admitting: Gastroenterology

## 2021-01-31 ENCOUNTER — Other Ambulatory Visit: Payer: Self-pay

## 2021-01-31 DIAGNOSIS — G4733 Obstructive sleep apnea (adult) (pediatric): Secondary | ICD-10-CM | POA: Diagnosis not present

## 2021-01-31 DIAGNOSIS — F3181 Bipolar II disorder: Secondary | ICD-10-CM

## 2021-01-31 DIAGNOSIS — F4310 Post-traumatic stress disorder, unspecified: Secondary | ICD-10-CM

## 2021-01-31 DIAGNOSIS — F411 Generalized anxiety disorder: Secondary | ICD-10-CM

## 2021-01-31 DIAGNOSIS — F410 Panic disorder [episodic paroxysmal anxiety] without agoraphobia: Secondary | ICD-10-CM | POA: Diagnosis not present

## 2021-01-31 DIAGNOSIS — Z0289 Encounter for other administrative examinations: Secondary | ICD-10-CM

## 2021-01-31 MED ORDER — SERTRALINE HCL 100 MG PO TABS
ORAL_TABLET | Freq: Every morning | ORAL | 3 refills | Status: DC
  Filled 2021-01-31: qty 180, 90d supply, fill #0
  Filled 2021-05-05: qty 180, 90d supply, fill #1
  Filled 2021-08-15: qty 180, 90d supply, fill #2

## 2021-01-31 NOTE — Progress Notes (Signed)
Tracy [name] [id number] July 01, 1966 55 y.o.  Virtual Visit via Video Note  I connected with pt @ on 01/31/21 at 12:00 PM EDT by a video enabled telemedicine application and verified that I am speaking with the correct person using two identifiers.   I discussed the limitations of evaluation and management by telemedicine and the availability of in person appointments. The patient expressed understanding and agreed to proceed.  I discussed the assessment and treatment plan with the patient. The patient was provided an opportunity to ask questions and all were answered. The patient agreed with the plan and demonstrated an understanding of the instructions.   The patient was advised to call back or seek an in-person evaluation if the symptoms worsen or if the condition fails to improve as anticipated.  I provided 30 minutes of non-face-to-face time during this encounter.  The patient was located at home.  The provider was located at [place].   Tracy Gell, NP   Subjective:   Patient ID:  Tracy [name] is a 55 y.o. (DOB 16-Jan-1966) female.  Chief Complaint: No chief complaint on file.   HPI Tracy [name] presents for follow-up of f MDD, GAD, PTSD, and Panic attacks.  Describes mood today as "worse". Pleasant. Tearful daily. Mood symptoms - reports increased depression and anxiety. Irritable at times. Having panic attacks - "gritting and grinding teeth". Increased worry and rumination. Getting overwhelmed easily. Has missed some time from work. Reports brain fog - "I'm not all here". Stating "I need help". Has times of going with the punches and other times ready to give up. Stating "depression has ruined my life". Not showering or completing self care. Apartment is messy. Stating "I can't get out of the bed". Feels physically exhausted. Husband has signed divorce papers. Stating "I am beyond frustrated". Diagnosed with anemia. Stating "everything I do is an  effort".  Decreased interest and motivation. Taking medications as prescribed. Energy levels poor - "non existent". Active, does not have a regular exercise routine.  Enjoys some usual interests and activities. Recently divorced. Siblings locally. Spending time with family. Appetite adequate. Weight loss.  Sleeping difficulties. Averages 3 to 4 hours. Sleep apnea - taking Nuvigil in the morning.  Focus and concentration difficulties. Completing tasks. Managing aspects of household. 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 Denies SI or HI.  Denies AH or VH.   Previous medication trials:  Antipsychotics: Vraylar-TD, Latuda, Abilify, Geodon, Rexulti  Mood Stabilizers - Depakote, Lithium, Lamictal, Equetro, Topamax, Trileptal, Gabapentin  SSRI - Zoloft, Lexapro, Celexa, Prozac, Viibryd, Trintellix, Prozac  SNRI - Pristiq, Effexor, Cymbalta  Wellbutrin - allergic reaction  Anti-anxiety - Buspar, Xanax, Clonazepam, Ativan, Valium  Sleep agents - Trazadone, Ambien, Restoril   Other - Deplin, Emsam, Serzone, Doxepin, Nuvigil, Vistaril  Previous treatment: ECT and TMS  Review of Systems:  Review of Systems  Musculoskeletal:  Negative for gait problem.  Neurological:  Negative for tremors.  Psychiatric/Behavioral:         Please refer to HPI   Medications: I have reviewed the patient's current medications.  Current Outpatient Medications  Medication Sig Dispense Refill   ALPRAZolam (XANAX XR) 0.5 MG 24 hr tablet TAKE 1 TABLET BY MOUTH 2 TIMES DAILY (Patient taking differently: Take 0.5 mg by mouth 2 (two) times daily as needed for anxiety.) 180 tablet 0   ARIPiprazole (ABILIFY) 10 MG tablet Take 10 mg by mouth at bedtime.  Blood Glucose Monitoring Suppl (FREESTYLE LITE) w/Device KIT USE TO CHECK BLOOD SUGAR ONCE A DAY 1 kit 0   Blood Glucose Monitoring Suppl (FREESTYLE LITE) w/Device KIT USE TO CHECK BLOOD SUGAR ONCE A DAY 1 kit 0   clonazePAM  (KLONOPIN) 1 MG tablet Take 1 mg by mouth 3 (three) times daily.   5   cyclobenzaprine (FLEXERIL) 10 MG tablet TAKE 1 TABLET BY MOUTH 2 TIMES DAILY AS NEEDED FOR MUSCLE SPASMS (Patient taking differently: Take 10 mg by mouth 2 (two) times daily as needed for muscle spasms.) 20 tablet 0   famotidine (PEPCID) 40 MG tablet Take 1 tablet (40 mg total) by mouth 2 (two) times daily. 60 tablet 0   ferrous sulfate 325 (65 FE) MG EC tablet Take 1 tablet (325 mg total) by mouth in the morning and at bedtime. Take iron supplement 2 hours before or 4 hours after PPI or other antacids as this requires some level of gastric acidity to aid in absorption 60 tablet 5   folic acid (FOLVITE) 1 MG tablet Take 1 tablet (1 mg total) by mouth daily. (Patient taking differently: Take 1 mg by mouth in the morning.) 30 tablet 6   furosemide (LASIX) 40 MG tablet TAKE 1 TABLET BY MOUTH DAILY. (Patient taking differently: Take 40 mg by mouth in the morning.) 30 tablet 3   glucose blood test strip USE TO CHECK BLOOD SUGAR ONCE A DAY (Patient taking differently: USE TO CHECK BLOOD SUGAR ONCE A DAY) 100 strip 12   hydrOXYzine (ATARAX/VISTARIL) 25 MG tablet Take 1 tablet (25 mg total) by mouth 3 (three) times daily as needed. (Patient taking differently: Take 25 mg by mouth at bedtime as needed (sleep).) 90 tablet 2   insulin degludec (TRESIBA FLEXTOUCH) 100 UNIT/ML FlexTouch Pen Inject 20 Units into the skin daily. (Patient taking differently: Inject 20 Units into the skin in the morning.) 6 mL 3   Insulin Pen Needle (PENTIPS) 31G X 5 MM MISC Use to inject insulin daily (Patient taking differently: Use to inject insulin daily) 100 each 3   ketorolac (TORADOL) 10 MG tablet TAKE 1 TABLET BY MOUTH EVERY 6 HOURS AS NEEDED (Patient taking differently: Take 10 mg by mouth every 6 (six) hours as needed (back pain).) 20 tablet 0   Lancets (FREESTYLE) lancets USE TO TEST BLOOD SUGAR ONCE A DAY (Patient taking differently: USE TO TEST BLOOD SUGAR  ONCE A DAY) 100 each 0   lidocaine (XYLOCAINE) 5 % ointment APPLY 1 G TOPICALLY 3 (THREE) TIMES DAILY BEFORE MEALS (DOESNT COME IN GEL, FAXED DR) (Patient taking differently: Apply 1 application topically 3 (three) times daily as needed (oral sores).) 1063.2 g 2   lisinopril-hydrochlorothiazide (ZESTORETIC) 10-12.5 MG tablet TAKE 1 TABLET BY MOUTH ONCE DAILY (Patient taking differently: Take 1 tablet by mouth in the morning.) 90 tablet 1   NUVIGIL 200 MG TABS Take 200 mg by mouth daily as needed (sleepiness).     nystatin (MYCOSTATIN/NYSTOP) powder Apply 3 times daily as directed. (Patient taking differently: Apply 1 application topically daily.) 60 g 3   ondansetron (ZOFRAN-ODT) 4 MG disintegrating tablet Take 4 mg by mouth 2 (two) times daily as needed for nausea or vomiting.     pimecrolimus (ELIDEL) 1 % cream APPLY TOPICALLY 2 TIMES DAILY TO COMPLETELY DRY AREA PLACE A THIN LAYER ONTO THE MOST SORE AREAS PRIOR TO BED. DO NOT RINSE (Patient taking differently: Apply 1 application topically 2 (two) times daily as needed (mouth sores).)  60 g 0   RABEprazole (ACIPHEX) 20 MG tablet Take 1 tablet (20 mg total) by mouth in the morning and at bedtime. 180 tablet 1   rizatriptan (MAXALT) 10 MG tablet Take 1 tablet (10 mg total) by mouth as needed for migraine. May repeat in 2 hours if needed (Patient taking differently: Take 10 mg by mouth every 2 (two) hours as needed for migraine. May repeat in 2 hours if needed) 10 tablet 0   rosuvastatin (CRESTOR) 20 MG tablet TAKE 1 TABLET BY MOUTH DAILY. (Patient taking differently: Take 20 mg by mouth in the morning.) 90 tablet 3   sertraline (ZOLOFT) 100 MG tablet TAKE 2 TABLETS BY MOUTH EVERY MORNING 180 tablet 3   Sod Picosulfate-Mag Ox-Cit Acd (CLENPIQ) 10-3.5-12 MG-GM -GM/160ML SOLN Take 1 kit by mouth as directed. 320 mL 0   sucralfate (CARAFATE) 1 g tablet Take 1 tablet (1 g total) by mouth 4 (four) times daily -  with meals and at bedtime for 15 days. 60 tablet  1   SYNTHROID 300 MCG tablet TAKE 1 TABLET BY MOUTH ONCE A DAY BEFORE BREAKFAST (Patient taking differently: Take 300 mcg by mouth daily before breakfast.) 90 tablet 1   Vitamin D, Ergocalciferol, (DRISDOL) 1.25 MG (50000 UNIT) CAPS capsule TAKE 1 CAPSULE BY MOUTH EVERY 7 DAYS. (Patient taking differently: Take 50,000 Units by mouth every Tuesday.) 12 capsule 1   No current facility-administered medications for this visit.    Medication Side Effects: None  Allergies:  Allergies  Allergen Reactions   Sulfa Antibiotics Itching, Swelling and Rash   Food     Walnut-mouth swelling/itching   Bupropion Hcl Itching and Rash   Codeine Itching   Sulfamethoxazole-Trimethoprim Rash    Past Medical History:  Diagnosis Date   Anxiety    Arthritis    back, knees, right elbow   Back pain    Bilateral swelling of feet and ankles    Bipolar disorder (HCC)    Borderline personality disorder ([place])    Cancer (HCC)    oral   Chewing difficulty    Cirrhosis (HCC)    Constipation    Dental crowns present    Depression    Diabetes mellitus without complication ([place])    Fatty liver    Food allergy    Walnuts   GAD (generalized anxiety disorder)    GERD (gastroesophageal reflux disease)    High cholesterol    no current med.   History of kidney stones    History of migraine headaches    Hypertension    under control with meds., has been on med. x 4 yr.   Hypothyroidism    IBS (irritable bowel syndrome)    Infertility, female    Joint pain    Lichen planus    Liver cirrhosis secondary to NASH (nonalcoholic steatohepatitis) (HCC)    NASH (nonalcoholic steatohepatitis)    Osteoarthritis    Other fatigue    Palpitations    PCOS (polycystic ovarian syndrome)    PONV (postoperative nausea and vomiting)    also hx. of emergence delirium 2007   Shortness of breath on exertion    Sleep apnea    uses CPAP nightly   Trigeminal neuralgia    Type 2 diabetes mellitus (HCC)    Vitamin D  deficiency     Family History  Problem Relation Age of Onset   Heart disease Father    Depression Father    Heart attack Father  Sudden death Father    Anxiety disorder Father    Bipolar disorder Father    Obesity Father    Cancer Brother        THROAT cancer   Depression Brother    Anxiety disorder Brother    Depression Sister    Breast cancer Mother    Hypertension Mother    Cancer Mother    Depression Mother    Sleep apnea Mother    Obesity Mother    Depression Maternal Grandmother    Stroke Maternal Grandfather    Colon cancer Brother    Pancreatic cancer Neg Hx    Stomach cancer Neg Hx    Liver disease Neg Hx     Social History   Socioeconomic History   Marital status: Legally Separated    Spouse name: Not on file   Number of children: 0   Years of education: 16   Highest education level: Not on file  Occupational History   Occupation: RN-Telephone Geophysical data processor: [employer]    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   Drug use: No   Sexual activity: Not Currently    Partners: Male    Birth control/protection: Condom  Other Topics Concern   Not on file  Social History Narrative   RN - [employer]   Regular exercise: no   Caffeine use: 2 x daily   Right-handed.   Lives alone.   Social Determinants of Health   Financial Resource Strain: Not on file  Food Insecurity: Not on file  Transportation Needs: Not on file  Physical Activity: Not on file  Stress: Not on file  Social Connections: Not on file  Intimate Partner Violence:  Not on file    Past Medical History, Surgical history, Social history, and Family history were reviewed and updated as appropriate.   Please see review of systems for further details on the patient's review from today.   Objective:   Physical Exam:  There were no vitals taken for this visit.  Physical Exam Constitutional:      General: She is not in acute distress. Musculoskeletal:        General: No deformity.  Neurological:     Mental Status: She is alert and oriented to person, place, and time.  Psychiatric:        Attention and Perception: Attention and perception normal. She does not perceive auditory or visual hallucinations.        Mood and Affect: Mood is not anxious or depressed. Affect is not labile, blunt, angry or inappropriate.        Speech: Speech normal.        Thought Content: Thought content is not paranoid or delusional. Thought content does not include homicidal or suicidal ideation. Thought content does not include homicidal or suicidal plan.        Cognition and Memory: Cognition and memory normal.     Comments: Insight intact  Lab Review:     Component Value Date/Time   NA 134 (L) 12/26/2020 0000   NA 140 10/11/2020 1041   K 4.3 12/26/2020 0000   CL 102 12/26/2020 0000   CO2 23 12/26/2020 0000   GLUCOSE 303 (H) 12/26/2020 0000   BUN 9 12/26/2020 0000   BUN 14 10/11/2020 1041   CREATININE 0.70 12/26/2020 0000   CALCIUM 9.6 12/26/2020 0000   PROT 7.2 12/26/2020 0000   PROT 7.1 10/11/2020 1041   ALBUMIN 4.2 10/11/2020 1041   AST 42 (H) 12/26/2020 0000   ALT 34 (H) 12/26/2020 0000   ALKPHOS 126 (H) 10/11/2020 1041   BILITOT 0.2 12/26/2020 0000   BILITOT <0.2 10/11/2020 1041   GFRNONAA 99 12/26/2020 0000   GFRAA 115 12/26/2020 0000       Component Value Date/Time   WBC 4.1 12/26/2020 0000   RBC 5.19 (H) 12/26/2020 0000   HGB 12.2 12/26/2020 0000   HGB 10.9 (L) 10/11/2020 1041   HCT 41.0 12/26/2020 0000   HCT 35.6 10/11/2020 1041   PLT  225 12/26/2020 0000   PLT 224 10/11/2020 1041   MCV 79.0 (L) 12/26/2020 0000   MCV 78 (L) 10/11/2020 1041   MCH 23.5 (L) 12/26/2020 0000   MCHC 29.8 (L) 12/26/2020 0000   RDW 16.3 (H) 12/26/2020 0000   RDW 17.5 (H) 10/11/2020 1041   LYMPHSABS 1,476 12/26/2020 0000   LYMPHSABS 1.7 10/11/2020 1041   MONOABS 0.3 09/08/2019 1032   EOSABS 127 12/26/2020 0000   EOSABS 0.2 10/11/2020 1041   BASOSABS 29 12/26/2020 0000   BASOSABS 0.0 10/11/2020 1041    No results found for: POCLITH, LITHIUM   No results found for: PHENYTOIN, PHENOBARB, VALPROATE, CBMZ   .res Assessment: Plan:    Plan:  PDMP reviewed  Add Lybalvi 84m at hs for mood symptoms Lamictal 2044mdaily Zoloft 10033m 2 daily Nuvigil 200m60mily  Clonazepam 1mg 34max XR 0.5mg  71mroxyzine 25mg T67m using 1 tablet at bedtime.   Will fill out paperwork for FMLA  [facility]  Has tried multiple medications without any long term relief of symptoms. Has also participated in [facility]. Felt like TMS wasEldonlpful initially and tried it a second time without further success. Does not feel current medications are offering symptom relief. Cuurently awaiting Spravato treatment.  Patient advised to contact office with any questions, adverse effects, or acute worsening in signs and symptoms.  Counseled patient regarding potential benefits, risks, and side effects of Lamictal to include potential risk of Stevens-Johnson syndrome. Advised patient to stop taking Lamictal and contact office immediately if rash develops and to seek urgent medical attention if rash is severe and/or spreading quickly.   Discussed potential benefits, risk, and side effects of benzodiazepines to include potential risk of tolerance and dependence, as well as possible drowsiness.  Advised patient not to drive if experiencing drowsiness and to take lowest possible effective dose to minimize risk of dependence and tolerance.  Discussed  potential metabolic side effects associated with atypical antipsychotics, as well as potential risk for movement side effects. Advised pt to contact office if movement side effects occur.    Diagnoses and all orders for this visit:  GAD (generalized anxiety disorder) -     sertraline (ZOLOFT) 100 MG tablet; TAKE 2 TABLETS BY MOUTH EVERY MORNING  Panic attacks -     sertraline (ZOLOFT) 100 MG tablet; TAKE 2 TABLETS BY MOUTH EVERY  MORNING  Bipolar II disorder (HCC)  PTSD (post-traumatic stress disorder) -     sertraline (ZOLOFT) 100 MG tablet; TAKE 2 TABLETS BY MOUTH EVERY MORNING  OSA (obstructive sleep apnea)    Please see After Visit Summary for patient specific instructions.  No future appointments.   No orders of the defined types were placed in this encounter.     -------------------------------

## 2021-02-07 ENCOUNTER — Telehealth: Payer: Self-pay | Admitting: Adult Health

## 2021-02-07 ENCOUNTER — Other Ambulatory Visit (HOSPITAL_COMMUNITY): Payer: Self-pay

## 2021-02-07 ENCOUNTER — Ambulatory Visit: Payer: 59 | Admitting: Family Medicine

## 2021-02-07 DIAGNOSIS — F3181 Bipolar II disorder: Secondary | ICD-10-CM | POA: Diagnosis not present

## 2021-02-07 NOTE — Telephone Encounter (Signed)
Noted  

## 2021-02-07 NOTE — Telephone Encounter (Signed)
Noted. Pls advise patient.

## 2021-02-07 NOTE — Telephone Encounter (Signed)
Pt advised and mailed copy.

## 2021-02-07 NOTE — Telephone Encounter (Signed)
Faxed completed forms to [employer] @ [phone] on 02/02/21

## 2021-02-07 NOTE — Telephone Encounter (Signed)
reviewed

## 2021-02-08 ENCOUNTER — Other Ambulatory Visit (HOSPITAL_COMMUNITY): Payer: Self-pay

## 2021-02-09 ENCOUNTER — Ambulatory Visit (INDEPENDENT_AMBULATORY_CARE_PROVIDER_SITE_OTHER): Payer: 59

## 2021-02-09 ENCOUNTER — Other Ambulatory Visit: Payer: Self-pay

## 2021-02-09 ENCOUNTER — Ambulatory Visit: Payer: 59 | Admitting: Sports Medicine

## 2021-02-09 DIAGNOSIS — M25562 Pain in left knee: Secondary | ICD-10-CM | POA: Diagnosis not present

## 2021-02-09 DIAGNOSIS — Z09 Encounter for follow-up examination after completed treatment for conditions other than malignant neoplasm: Secondary | ICD-10-CM

## 2021-02-09 DIAGNOSIS — M1712 Unilateral primary osteoarthritis, left knee: Secondary | ICD-10-CM

## 2021-02-09 DIAGNOSIS — M1711 Unilateral primary osteoarthritis, right knee: Secondary | ICD-10-CM | POA: Diagnosis not present

## 2021-02-09 MED ORDER — AMBULATORY NON FORMULARY MEDICATION: 0 refills | Status: DC

## 2021-02-09 MED ORDER — MELOXICAM 15 MG PO TABS: ORAL_TABLET | ORAL | 3 refills | Status: DC

## 2021-02-09 NOTE — Assessment & Plan Note (Signed)
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  She has known osteoarthritis, chronic process with exacerbation currently, pain anteriorly, with gelling, pain going up and down stairs, squatting. No overt mechanical symptoms, on exam she does not have much of an effusion, she does have some tenderness at the posterior/medial joint line. We will do updated x-rays, ultrasound guided injection per her request, meloxicam, home conditioning exercises. I will also write her a prescription for a convertible workstation as well as a note of medical necessity. Return to see me in 4 weeks, we will consider viscosupplementation if insufficiently better.

## 2021-02-09 NOTE — Progress Notes (Signed)
    Procedures performed today:    Procedure: Real-time Ultrasound Guided injection of the left knee Device: Samsung HS60  Verbal informed consent obtained.  Time-out conducted.  Noted no overlying erythema, induration, or other signs of local infection.  Skin prepped in a sterile fashion.  Local anesthesia: Topical Ethyl chloride.  With sterile technique and under real time ultrasound guidance: Noted trace effusion, 1 cc Kenalog 40, 2 cc lidocaine, 2 cc bupivacaine injected easily Completed without difficulty  Advised to call if fevers/chills, erythema, induration, drainage, or persistent bleeding.  Images permanently stored and available for review in PACS.  Impression: Technically successful ultrasound guided injection.  Independent interpretation of notes and tests performed by another provider:   None.  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  She has known osteoarthritis, chronic process with exacerbation currently, pain anteriorly, with gelling, pain going up and down stairs, squatting. No overt mechanical symptoms, on exam she does not have much of an effusion, she does have some tenderness at the posterior/medial joint line. We will do updated x-rays, ultrasound guided injection per her request, meloxicam, home conditioning exercises. I will also write her a prescription for a convertible workstation as well as a note of medical necessity. Return to see me in 4 weeks, we will consider viscosupplementation if insufficiently better.    ___________________________________________ [name], M.D., ABFM., CAQSM. Primary Care and [place] Instructor of [place] [facility]

## 2021-02-10 ENCOUNTER — Other Ambulatory Visit: Payer: Self-pay

## 2021-02-10 ENCOUNTER — Emergency Department (HOSPITAL_BASED_OUTPATIENT_CLINIC_OR_DEPARTMENT_OTHER): Payer: 59

## 2021-02-10 ENCOUNTER — Encounter (HOSPITAL_BASED_OUTPATIENT_CLINIC_OR_DEPARTMENT_OTHER): Payer: Self-pay | Admitting: Emergency Medicine

## 2021-02-10 ENCOUNTER — Emergency Department (HOSPITAL_BASED_OUTPATIENT_CLINIC_OR_DEPARTMENT_OTHER)
Admission: EM | Admit: 2021-02-10 | Discharge: 2021-02-10 | Disposition: A | Discharge status: Home / Self Care | Payer: 59 | Attending: Emergency Medicine | Admitting: Emergency Medicine

## 2021-02-10 DIAGNOSIS — Z79899 Other long term (current) drug therapy: Secondary | ICD-10-CM | POA: Diagnosis not present

## 2021-02-10 DIAGNOSIS — R0602 Shortness of breath: Secondary | ICD-10-CM | POA: Diagnosis not present

## 2021-02-10 DIAGNOSIS — Z85828 Personal history of other malignant neoplasm of skin: Secondary | ICD-10-CM | POA: Diagnosis not present

## 2021-02-10 DIAGNOSIS — R0682 Tachypnea, not elsewhere classified: Secondary | ICD-10-CM | POA: Insufficient documentation

## 2021-02-10 DIAGNOSIS — Z87891 Personal history of nicotine dependence: Secondary | ICD-10-CM | POA: Diagnosis not present

## 2021-02-10 DIAGNOSIS — E039 Hypothyroidism, unspecified: Secondary | ICD-10-CM | POA: Diagnosis not present

## 2021-02-10 DIAGNOSIS — E1165 Type 2 diabetes mellitus with hyperglycemia: Secondary | ICD-10-CM | POA: Insufficient documentation

## 2021-02-10 DIAGNOSIS — Z794 Long term (current) use of insulin: Secondary | ICD-10-CM | POA: Insufficient documentation

## 2021-02-10 DIAGNOSIS — I10 Essential (primary) hypertension: Secondary | ICD-10-CM | POA: Diagnosis not present

## 2021-02-10 DIAGNOSIS — I517 Cardiomegaly: Secondary | ICD-10-CM | POA: Diagnosis not present

## 2021-02-10 DIAGNOSIS — R739 Hyperglycemia, unspecified: Secondary | ICD-10-CM

## 2021-02-10 DIAGNOSIS — I2699 Other pulmonary embolism without acute cor pulmonale: Secondary | ICD-10-CM | POA: Diagnosis not present

## 2021-02-10 LAB — COMPREHENSIVE METABOLIC PANEL
ALT: 37 U/L (ref 0–44)
AST: 36 U/L (ref 15–41)
Albumin: 3.9 g/dL (ref 3.5–5.0)
Alkaline Phosphatase: 162 U/L — ABNORMAL HIGH (ref 38–126)
Anion gap: 12 (ref 5–15)
BUN: 16 mg/dL (ref 6–20)
CO2: 22 mmol/L (ref 22–32)
Calcium: 8.9 mg/dL (ref 8.9–10.3)
Chloride: 97 mmol/L — ABNORMAL LOW (ref 98–111)
Creatinine, Ser: 1.02 mg/dL — ABNORMAL HIGH (ref 0.44–1.00)
GFR, Estimated: >60 mL/min (ref >60)
Glucose, Bld: 512 mg/dL (ref 70–99)
Potassium: 3.9 mmol/L (ref 3.5–5.1)
Sodium: 131 mmol/L — ABNORMAL LOW (ref 135–145)
Total Bilirubin: 0.6 mg/dL (ref 0.3–1.2)
Total Protein: 6.8 g/dL (ref 6.5–8.1)

## 2021-02-10 LAB — URINALYSIS, ROUTINE W REFLEX MICROSCOPIC
Bilirubin Urine: NEGATIVE
Glucose, UA: >1000 mg/dL — AB
Hgb urine dipstick: NEGATIVE
Leukocytes,Ua: NEGATIVE
Nitrite: NEGATIVE
Protein, ur: NEGATIVE mg/dL
Specific Gravity, Urine: 1.035 — ABNORMAL HIGH (ref 1.005–1.030)
pH: 6 (ref 5.0–8.0)

## 2021-02-10 LAB — I-STAT VENOUS BLOOD GAS, ED
Acid-Base Excess: 0 mmol/L (ref 0.0–2.0)
Bicarbonate: 21.4 mmol/L (ref 20.0–28.0)
Calcium, Ion: 1.18 mmol/L (ref 1.15–1.40)
HCT: 33 % — ABNORMAL LOW (ref 36.0–46.0)
Hemoglobin: 11.2 g/dL — ABNORMAL LOW (ref 12.0–15.0)
O2 Saturation: 97 %
Patient temperature: 98.1
Potassium: 4 mmol/L (ref 3.5–5.1)
Sodium: 132 mmol/L — ABNORMAL LOW (ref 135–145)
TCO2: 22 mmol/L (ref 22–32)
pCO2, Ven: 24.7 mmHg — ABNORMAL LOW (ref 44.0–60.0)
pH, Ven: 7.545 — ABNORMAL HIGH (ref 7.250–7.430)
pO2, Ven: 73 mmHg — ABNORMAL HIGH (ref 32.0–45.0)

## 2021-02-10 LAB — CBC WITH DIFFERENTIAL/PLATELET
Abs Immature Granulocytes: 0.04 10*3/uL (ref 0.00–0.07)
Basophils Absolute: 0 10*3/uL (ref 0.0–0.1)
Basophils Relative: 0 %
Eosinophils Absolute: 0 10*3/uL (ref 0.0–0.5)
Eosinophils Relative: 0 %
HCT: 35.4 % — ABNORMAL LOW (ref 36.0–46.0)
Hemoglobin: 11.3 g/dL — ABNORMAL LOW (ref 12.0–15.0)
Immature Granulocytes: 1 %
Lymphocytes Relative: 10 %
Lymphs Abs: 0.7 10*3/uL (ref 0.7–4.0)
MCH: 25.4 pg — ABNORMAL LOW (ref 26.0–34.0)
MCHC: 31.9 g/dL (ref 30.0–36.0)
MCV: 79.6 fL — ABNORMAL LOW (ref 80.0–100.0)
Monocytes Absolute: 0.3 10*3/uL (ref 0.1–1.0)
Monocytes Relative: 4 %
Neutro Abs: 6.2 10*3/uL (ref 1.7–7.7)
Neutrophils Relative %: 85 %
Platelets: 158 10*3/uL (ref 150–400)
RBC: 4.45 MIL/uL (ref 3.87–5.11)
RDW: 21.3 % — ABNORMAL HIGH (ref 11.5–15.5)
WBC: 7.3 10*3/uL (ref 4.0–10.5)
nRBC: 0 % (ref 0.0–0.2)

## 2021-02-10 LAB — CBG MONITORING, ED
Glucose-Capillary: 489 mg/dL — ABNORMAL HIGH (ref 70–99)
Glucose-Capillary: 423 mg/dL — ABNORMAL HIGH (ref 70–99)
Glucose-Capillary: 388 mg/dL — ABNORMAL HIGH (ref 70–99)
Glucose-Capillary: 384 mg/dL — ABNORMAL HIGH (ref 70–99)

## 2021-02-10 LAB — TROPONIN I (HIGH SENSITIVITY)
Troponin I (High Sensitivity): 2 ng/L (ref <18)
Troponin I (High Sensitivity): 3 ng/L (ref <18)

## 2021-02-10 LAB — BETA-HYDROXYBUTYRIC ACID: Beta-Hydroxybutyric Acid: 0.2 mmol/L (ref 0.05–0.27)

## 2021-02-10 LAB — PREGNANCY, URINE: Preg Test, Ur: NEGATIVE

## 2021-02-10 LAB — D-DIMER, QUANTITATIVE: D-Dimer, Quant: 0.55 ug/mL-FEU — ABNORMAL HIGH (ref 0.00–0.50)

## 2021-02-10 MED ORDER — INSULIN ASPART 100 UNIT/ML IJ SOLN
8.0000 [IU] | Freq: Once | INTRAMUSCULAR | Status: AC
  Administered 2021-02-10: 8 [IU] via INTRAVENOUS

## 2021-02-10 MED ORDER — SODIUM CHLORIDE 0.9 % IV BOLUS
1000.0000 mL | Freq: Once | INTRAVENOUS | Status: AC
  Administered 2021-02-10: 1000 mL via INTRAVENOUS

## 2021-02-10 MED ORDER — IOHEXOL 350 MG/ML SOLN
75.0000 mL | Freq: Once | INTRAVENOUS | Status: AC | PRN
  Administered 2021-02-10: 75 mL via INTRAVENOUS

## 2021-02-10 NOTE — ED Triage Notes (Signed)
Pt states she received a steroid injection in her knee yesterday and later that night she became very SOB.  Pt states she checked her CBG at home and it was over 500.  Pt reports increased thirst.

## 2021-02-10 NOTE — ED Notes (Signed)
Dr Billy Fischer in room w/patient now.

## 2021-02-10 NOTE — ED Provider Notes (Signed)
[facility] EMERGENCY DEPT Provider Note   CSN: [id number] Arrival date & time: 02/10/21  1106     History Chief Complaint  Patient presents with   Hyperglycemia    Tracy [name] is a 55 y.o. female.  HPI     55 year old female with a history of type 2 diabetes, cirrhosis secondary to NASH, hypothyroidism, who presents with concern for dyspnea and increased thirst after steroid injection yesterday.  Over the week prior to this she has had some dyspnea.  Has had intermittent chest heaviness over the several months, reports she believes them to be panic attacks and had discussed with her psychiatry NP.  Not necessarily exertional but does note dyspnea is worse with exertion and overall feeling of fatigue.  Has had some nausea in the mornings, not specifically with symptoms today. Presenting today due to concerns of hyperglycemia, increased thirst. After receiving the injection yesterday she has had severe increased thirst , was up all night trying to drink fluids.  Has had dyspnea associated with this> Has not been vomiting.  No current chest heaviness.  No fevers, no cough.  Reports she is also feeling anxious. Urinary frequency but no dysuria.  Past Medical History:  Diagnosis Date   Anxiety    Arthritis    back, knees, right elbow   Back pain    Bilateral swelling of feet and ankles    Bipolar disorder (HCC)    Borderline personality disorder (HCC)    Cancer (HCC)    oral   Chewing difficulty    Cirrhosis (HCC)    Constipation    Dental crowns present    Depression    Diabetes mellitus without complication ([place])    Fatty liver    Food allergy    Walnuts   GAD (generalized anxiety disorder)    GERD (gastroesophageal reflux disease)    High cholesterol    no current med.   History of kidney stones    History of migraine headaches    Hypertension    under control with meds., has been on med. x 4 yr.   Hypothyroidism    IBS (irritable bowel syndrome)     Infertility, female    Joint pain    Lichen planus    Liver cirrhosis secondary to NASH (nonalcoholic steatohepatitis) (HCC)    NASH (nonalcoholic steatohepatitis)    Osteoarthritis    Other fatigue    Palpitations    PCOS (polycystic ovarian syndrome)    PONV (postoperative nausea and vomiting)    also hx. of emergence delirium 2007   Shortness of breath on exertion    Sleep apnea    uses CPAP nightly   Trigeminal neuralgia    Type 2 diabetes mellitus ([place])    Vitamin D deficiency     Patient Active Problem List   Diagnosis Date Noted   Elevated blood sugar 02/11/2021   Diarrhea    Gastroesophageal reflux disease    Generalized abdominal pain    Nausea without vomiting    Gastritis and gastroduodenitis    Diverticulosis of colon without hemorrhage    Grade II internal hemorrhoids    Periumbilical pain 37/16/9678   Dyspepsia 12/14/2020   Leg swelling 09/21/2020   Morbid obesity with BMI of 40.0-44.9, adult ([place]) 06/23/2020   Chronic left shoulder pain 05/17/2020   Oral mucosal lesion 06/17/2019   Facial rash 06/16/2019   Healthcare maintenance 04/22/2019   UTI (urinary tract infection) 04/22/2019   Status post total replacement of  left hip 03/19/2019   Unilateral primary osteoarthritis, left hip 02/10/2019   Greater trochanteric bursitis of left hip 07/24/2018   Arthritis of left hip 06/25/2018   Primary osteoarthritis of left knee 01/21/2018   Encounter for general adult medical examination with abnormal findings 01/04/2018   Vitamin D deficiency 01/04/2018   Lower back pain 07/18/2017   Hemorrhoids, internal 07/18/2017   Trigeminal neuralgia 01/18/2016   Bipolar disorder ([place]) 11/28/2015   Borderline personality disorder ([place]) 11/28/2015   Carpal tunnel syndrome 09/06/2015   MDD (major depressive disorder) 06/06/2015   Severe episode of recurrent major depressive disorder, without psychotic features ([place])    History of posttraumatic stress disorder (PTSD)     GAD (generalized anxiety disorder)    Insulin resistance 02/09/2015   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/2015   Acanthosis nigricans, acquired 43/32/9518   Nonalcoholic fatty liver disease 02/09/2015   Female hirsutism 02/09/2015   Oligomenorrhea 02/09/2015   Infertility associated with anovulation 02/09/2015   Anxiety and depression 10/31/2014   Obstructive sleep apnea 06/27/2014   Diabetes mellitus, type 2 ([place]) 06/10/2013   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[place]) 01/01/2012   Cystic teratoma    PCOS (polycystic ovarian syndrome)    TOBACCO USE, QUIT 02/08/2010   HYPOTHYROIDISM, POSTSURGICAL 10/02/2007   POLYCYSTIC OVARIAN DISEASE 10/02/2007   Elevated cholesterol 10/02/2007   DEPRESSION 10/02/2007   Migraine 10/02/2007   Essential hypertension 10/02/2007   RENAL CALCULUS, HX OF 10/02/2007    Past Surgical History:  Procedure Laterality Date   ACHILLES TENDON SURGERY Left 2007   APPENDECTOMY     CARPAL TUNNEL RELEASE Right 01/12/2016   Procedure: RIGHT CARPAL TUNNEL RELEASE;  Surgeon: Roseanne Kaufman, MD;  Location: [facility];  Service: Orthopedics;  Laterality: Right;   COLONOSCOPY WITH PROPOFOL N/A 04/20/2014   Procedure: COLONOSCOPY WITH PROPOFOL;  Surgeon: Arta Silence, MD;  Location: [facility];  Service: Endoscopy;  Laterality: N/A;   COLONOSCOPY WITH PROPOFOL N/A 01/10/2021   Procedure: COLONOSCOPY WITH PROPOFOL;  Surgeon: Lavena Bullion, DO;  Location: [facility];  Service: Gastroenterology;  Laterality: N/A;   ESOPHAGOGASTRODUODENOSCOPY (EGD) WITH PROPOFOL N/A 04/20/2014   Procedure: ESOPHAGOGASTRODUODENOSCOPY (EGD) WITH PROPOFOL;  Surgeon: Arta Silence, MD;  [facility];  Service: Endoscopy;  Laterality: N/A;   ESOPHAGOGASTRODUODENOSCOPY (EGD) WITH PROPOFOL N/A 01/10/2021   Procedure: ESOPHAGOGASTRODUODENOSCOPY (EGD) WITH PROPOFOL;  Surgeon: Lavena Bullion, DO;  [facility];  Service: Gastroenterology;  Laterality: N/A;   EXCISION HAGLUND'S  DEFORMITY WITH ACHILLES TENDON REPAIR Right 02/25/2013   Procedure: RIGHT ACHILLES DEBRIDEMENT AND RECONSTRUCTION;  HAGLUND'S EXCISION; GASTROC RECESSION AND FLEXOR HALLUCIS LONGUS TRANSFER;  Surgeon: Wylene Simmer, MD;  Location: [facility];  Service: Orthopedics;  Laterality: Right;   JOINT REPLACEMENT     hip   LUMBAR LAMINECTOMY     X 3   neck fusion     OVARIAN CYST SURGERY Left 1998   PILONIDAL CYST EXCISION  1994   RIGHT OOPHORECTOMY     STERIOD INJECTION Left 01/12/2016   Procedure: STEROID INJECTION LEFT WRIST;  Surgeon: Roseanne Kaufman, MD;  Location: [facility];  Service: Orthopedics;  Laterality: Left;   TOTAL HIP ARTHROPLASTY Left 03/19/2019   Procedure: LEFT TOTAL HIP ARTHROPLASTY ANTERIOR APPROACH;  Surgeon: Mcarthur Rossetti, MD;  Location: [facility];  Service: Orthopedics;  Laterality: Left;   TOTAL THYROIDECTOMY  2003   UPPER GI ENDOSCOPY  01/19/2015     OB History     Gravida  0   Para  Term      Preterm      AB      Living         SAB      IAB      Ectopic      Multiple      Live Births              Family History  Problem Relation Age of Onset   Heart disease Father    Depression Father    Heart attack Father    Sudden death Father    Anxiety disorder Father    Bipolar disorder Father    Obesity Father    Cancer Brother        THROAT cancer   Depression Brother    Anxiety disorder Brother    Depression Sister    Breast cancer Mother    Hypertension Mother    Cancer Mother    Depression Mother    Sleep apnea Mother    Obesity Mother    Depression Maternal Grandmother    Stroke Maternal Grandfather    Colon cancer Brother    Pancreatic cancer Neg Hx    Stomach cancer Neg Hx    Liver disease Neg Hx     Social History   Tobacco Use   Smoking status: Former    Packs/day: 0.50    Years: 15.00    Pack years: 7.50    Types: Cigarettes    Quit date: 02/2019    Years since quitting: 2.0    Smokeless tobacco: Never  Vaping Use   Vaping Use: Never used  Substance Use Topics   Alcohol use: No    Alcohol/week: 0.0 standard drinks    Comment: Has not had any alcohol since 07/2014 - before this date, she rarely drink.   Drug use: No    Home Medications Prior to Admission medications   Medication Sig Start Date End Date Taking? Authorizing Provider  ALPRAZolam (XANAX XR) 0.5 MG 24 hr tablet TAKE 1 TABLET BY MOUTH 2 TIMES DAILY Patient taking differently: Take 0.5 mg by mouth 2 (two) times daily as needed for anxiety. 09/15/20 03/14/21  Noemi Chapel, NP  AMBULATORY NON FORMULARY MEDICATION Convertible/stand-up workstation such as "[employer]." 02/09/21   Silverio Decamp, MD  ARIPiprazole (ABILIFY) 10 MG tablet Take 10 mg by mouth at bedtime. 09/15/20   [provider]  Blood Glucose Monitoring Suppl (FREESTYLE LITE) w/Device KIT USE TO CHECK BLOOD SUGAR ONCE A DAY 10/16/20 10/16/21  Luetta Nutting, DO  Blood Glucose Monitoring Suppl (FREESTYLE LITE) w/Device KIT USE TO CHECK BLOOD SUGAR ONCE A DAY 09/22/20 09/22/21  Luetta Nutting, DO  clonazePAM (KLONOPIN) 1 MG tablet Take 1 mg by mouth 3 (three) times daily.  02/03/17   [provider]  cyclobenzaprine (FLEXERIL) 10 MG tablet TAKE 1 TABLET BY MOUTH 2 TIMES DAILY AS NEEDED FOR MUSCLE SPASMS Patient taking differently: Take 10 mg by mouth 2 (two) times daily as needed for muscle spasms. 01/03/21 01/03/22  Luetta Nutting, DO  famotidine (PEPCID) 40 MG tablet Take 1 tablet (40 mg total) by mouth 2 (two) times daily. 12/27/20   Cirigliano, Vito V, DO  ferrous sulfate 325 (65 FE) MG EC tablet Take 1 tablet (325 mg total) by mouth in the morning and at bedtime. Take iron supplement 2 hours before or 4 hours after PPI or other antacids as this requires some level of gastric acidity to aid in absorption 12/29/20   Cirigliano, [employer]  V, DO  folic acid (FOLVITE) 1 MG tablet Take 1 tablet (1 mg total) by mouth daily. Patient taking  differently: Take 1 mg by mouth in the morning. 12/29/20   Cirigliano, Vito V, DO  furosemide (LASIX) 40 MG tablet TAKE 1 TABLET BY MOUTH DAILY. Patient taking differently: Take 40 mg by mouth in the morning. 10/11/20 10/11/21  Luetta Nutting, DO  glucose blood (FREESTYLE LITE) test strip Use as instructed 02/11/21   Couture, Cortni S, PA-C  glucose blood test strip USE TO CHECK BLOOD SUGAR ONCE A DAY Patient taking differently: USE TO CHECK BLOOD SUGAR ONCE A DAY 09/22/20 09/22/21  Luetta Nutting, DO  hydrOXYzine (ATARAX/VISTARIL) 25 MG tablet Take 1 tablet (25 mg total) by mouth 3 (three) times daily as needed. Patient taking differently: Take 25 mg by mouth at bedtime as needed (sleep). 11/08/20   Mozingo, Berdie Ogren, NP  insulin degludec (TRESIBA FLEXTOUCH) 100 UNIT/ML FlexTouch Pen Inject 20 Units into the skin daily. Patient taking differently: Inject 20 Units into the skin in the morning. 12/28/20 03/02/21  Luetta Nutting, DO  Insulin Pen Needle (PENTIPS) 31G X 5 MM MISC Use to inject insulin daily Patient taking differently: Use to inject insulin daily 12/29/20   Luetta Nutting, DO  Lancets (FREESTYLE) lancets USE TO TEST BLOOD SUGAR ONCE A DAY Patient taking differently: USE TO TEST BLOOD SUGAR ONCE A DAY 09/22/20 09/22/21  Luetta Nutting, DO  lidocaine (XYLOCAINE) 5 % ointment APPLY 1 G TOPICALLY 3 (THREE) TIMES DAILY BEFORE MEALS (DOESNT COME IN GEL, FAXED DR) Patient taking differently: Apply 1 application topically 3 (three) times daily as needed (oral sores). 08/10/20 08/10/21    lisinopril-hydrochlorothiazide (ZESTORETIC) 10-12.5 MG tablet TAKE 1 TABLET BY MOUTH ONCE DAILY Patient taking differently: Take 1 tablet by mouth in the morning. 05/17/20 05/17/21  Silverio Decamp, MD  meloxicam (MOBIC) 15 MG tablet One tab PO qAM with a meal for 2 weeks, then daily prn pain. 02/09/21   Silverio Decamp, MD  NUVIGIL 200 MG TABS Take 200 mg by mouth daily as needed (sleepiness). 12/29/19    [provider]  nystatin (MYCOSTATIN/NYSTOP) powder Apply 3 times daily as directed. Patient taking differently: Apply 1 application topically daily. 12/01/20   Luetta Nutting, DO  ondansetron (ZOFRAN-ODT) 4 MG disintegrating tablet Take 4 mg by mouth 2 (two) times daily as needed for nausea or vomiting. 02/05/20   [provider]  pimecrolimus (ELIDEL) 1 % cream APPLY TOPICALLY 2 TIMES DAILY TO COMPLETELY DRY AREA PLACE A THIN LAYER ONTO THE MOST SORE AREAS PRIOR TO BED. DO NOT RINSE Patient taking differently: Apply 1 application topically 2 (two) times daily as needed (mouth sores). 09/14/20 09/14/21    RABEprazole (ACIPHEX) 20 MG tablet Take 1 tablet (20 mg total) by mouth in the morning and at bedtime. 12/22/20 12/22/21  Luetta Nutting, DO  rizatriptan (MAXALT) 10 MG tablet Take 1 tablet (10 mg total) by mouth as needed for migraine. May repeat in 2 hours if needed Patient taking differently: Take 10 mg by mouth every 2 (two) hours as needed for migraine. May repeat in 2 hours if needed 12/23/17   Binnie Rail, MD  rosuvastatin (CRESTOR) 20 MG tablet TAKE 1 TABLET BY MOUTH DAILY. Patient taking differently: Take 20 mg by mouth in the morning. 03/10/20 04/23/21  Luetta Nutting, DO  sertraline (ZOLOFT) 100 MG tablet TAKE 2 TABLETS BY MOUTH EVERY MORNING 01/31/21 01/31/22  Mozingo, Berdie Ogren, NP  Sod Picosulfate-Mag Ox-Cit  Acd (CLENPIQ) 10-3.5-12 MG-GM -GM/160ML SOLN Take 1 kit by mouth as directed. 12/27/20   Cirigliano, Vito V, DO  sucralfate (CARAFATE) 1 g tablet Take 1 tablet (1 g total) by mouth 4 (four) times daily -  with meals and at bedtime for 15 days. 12/14/20 01/09/21  Luetta Nutting, DO  SYNTHROID 300 MCG tablet TAKE 1 TABLET BY MOUTH ONCE A DAY BEFORE BREAKFAST Patient taking differently: Take 300 mcg by mouth daily before breakfast. 10/12/20 10/12/21  Luetta Nutting, DO  Vitamin D, Ergocalciferol, (DRISDOL) 1.25 MG (50000 UNIT) CAPS capsule TAKE 1 CAPSULE BY MOUTH EVERY 7  DAYS. Patient taking differently: Take 50,000 Units by mouth every Tuesday. 12/01/20 12/01/21  Luetta Nutting, DO    Allergies    Sulfa antibiotics, Food, Bupropion hcl, Codeine, and Sulfamethoxazole-trimethoprim  Review of Systems   Review of Systems  Constitutional:  Negative for fever.  HENT:  Negative for sore throat.   Eyes:  Negative for visual disturbance.  Respiratory:  Positive for shortness of breath. Negative for cough.   Cardiovascular:  Negative for chest pain.  Gastrointestinal:  Positive for nausea. Negative for abdominal pain and vomiting.  Endocrine: Positive for polydipsia and polyuria.  Genitourinary:  Negative for difficulty urinating.  Musculoskeletal:  Negative for back pain and neck pain.  Skin:  Negative for rash.  Neurological:  Negative for syncope and headaches.   Physical Exam Updated Vital Signs BP 107/89 (BP Location: Left Arm)   Pulse 72   Temp 98.1 F (36.7 C) (Oral)   Resp 16   Ht _0  (1.803 m)   Wt (!) 149.7 kg   SpO2 99%   BMI 46.03 kg/m   Physical Exam Vitals and nursing note reviewed.  Constitutional:      General: She is not in acute distress.    Appearance: She is well-developed. She is not diaphoretic.  HENT:     Head: Normocephalic and atraumatic.  Eyes:     Conjunctiva/sclera: Conjunctivae normal.  Cardiovascular:     Rate and Rhythm: Normal rate and regular rhythm.     Heart sounds: Normal heart sounds. No murmur heard.   No friction rub. No gallop.  Pulmonary:     Effort: Pulmonary effort is normal. Tachypnea present. No respiratory distress.     Breath sounds: Normal breath sounds. No wheezing or rales.  Abdominal:     General: There is no distension.     Palpations: Abdomen is soft.     Tenderness: There is no abdominal tenderness. There is no guarding.  Musculoskeletal:        General: No tenderness.     Cervical back: Normal range of motion.  Skin:    General: Skin is warm and dry.     Findings: No erythema or  rash.  Neurological:     Mental Status: She is alert and oriented to person, place, and time.    ED Results / Procedures / Treatments   Labs (all labs ordered are listed, but only abnormal results are displayed) Labs Reviewed  URINALYSIS, ROUTINE W REFLEX MICROSCOPIC - Abnormal; Notable for the following components:      Result Value   Color, Urine COLORLESS (*)    Specific Gravity, Urine 1.035 (*)    Glucose, UA >1,000 (*)    Ketones, ur TRACE (*)    Bacteria, UA RARE (*)    All other components within normal limits  CBC WITH DIFFERENTIAL/PLATELET - Abnormal; Notable for the following components:   Hemoglobin 11.3 (*)  HCT 35.4 (*)    MCV 79.6 (*)    MCH 25.4 (*)    RDW 21.3 (*)    All other components within normal limits  COMPREHENSIVE METABOLIC PANEL - Abnormal; Notable for the following components:   Sodium 131 (*)    Chloride 97 (*)    Glucose, Bld 512 (*)    Creatinine, Ser 1.02 (*)    Alkaline Phosphatase 162 (*)    All other components within normal limits  D-DIMER, QUANTITATIVE - Abnormal; Notable for the following components:   D-Dimer, Quant 0.55 (*)    All other components within normal limits  CBG MONITORING, ED - Abnormal; Notable for the following components:   Glucose-Capillary 489 (*)    All other components within normal limits  CBG MONITORING, ED - Abnormal; Notable for the following components:   Glucose-Capillary 423 (*)    All other components within normal limits  I-STAT VENOUS BLOOD GAS, ED - Abnormal; Notable for the following components:   pH, Ven 7.545 (*)    pCO2, Ven 24.7 (*)    pO2, Ven 73.0 (*)    Sodium 132 (*)    HCT 33.0 (*)    Hemoglobin 11.2 (*)    All other components within normal limits  CBG MONITORING, ED - Abnormal; Notable for the following components:   Glucose-Capillary 388 (*)    All other components within normal limits  CBG MONITORING, ED - Abnormal; Notable for the following components:   Glucose-Capillary 384 (*)     All other components within normal limits  PREGNANCY, URINE  BETA-HYDROXYBUTYRIC ACID  TROPONIN I (HIGH SENSITIVITY)  TROPONIN I (HIGH SENSITIVITY)    EKG EKG Interpretation  Date/Time:  Saturday February 10 2021 11:27:10 EDT Ventricular Rate:  85 PR Interval:  173 QRS Duration: 92 QT Interval:  379 QTC Calculation: 451 R Axis:   21 Text Interpretation: Sinus rhythm No significant change since last tracing Confirmed by Gareth Morgan [phone]) on 02/10/2021 12:47:36 PM  Radiology CT Angio Chest PE W and/or Wo Contrast  Result Date: 02/10/2021 CLINICAL DATA:  Shortness of breath after steroid injection in knee yesterday. Elevated blood sugar. EXAM: CT ANGIOGRAPHY CHEST WITH CONTRAST TECHNIQUE: Multidetector CT imaging of the chest was performed using the standard protocol during bolus administration of intravenous contrast. Multiplanar CT image reconstructions and MIPs were obtained to evaluate the vascular anatomy. CONTRAST:  83m OMNIPAQUE IOHEXOL 350 MG/ML SOLN COMPARISON:  Chest radiograph of earlier today. CTA chest 09/22/2020 FINDINGS: Cardiovascular: The quality of this exam for evaluation of pulmonary embolism is poor. The bolus is suboptimally timed, with contrast centered in the SVC. Exam also degraded by patient body habitus. No central/main pulmonary emboli. No large lobar emboli. Smaller emboli cannot be excluded. normal aortic caliber. Tortuous thoracic aorta. Mild cardiomegaly, without pericardial effusion. Lad coronary artery calcification. Mediastinum/Nodes: No mediastinal or hilar adenopathy. Lungs/Pleura: No pleural fluid.  Clear imaged lungs. Upper Abdomen: Hepatic steatosis and cirrhosis are moderate. Normal imaged portions of the stomach, pancreas, gallbladder, left adrenal gland, kidneys. 1.0 cm right adrenal nodule is similar back to 05/05/2019 and can be presumed a benign lesion, likely an adenoma. Borderline splenomegaly, 13.0 cm craniocaudal. Musculoskeletal: Cervical  spine fixation. Review of the MIP images confirms the above findings. IMPRESSION: 1. Poor quality evaluation for pulmonary embolism. No central embolism identified. Smaller emboli cannot be excluded. 2. Age advanced coronary artery atherosclerosis. Recommend assessment of coronary risk factors and consideration of medical therapy. 3. Cirrhosis and hepatic steatosis.  Borderline  splenomegaly. 4. Right adrenal nodule is likely an adenoma and is similar to 2020. Electronically Signed   By: Abigail Miyamoto M.D.   On: 02/10/2021 16:50   DG Chest Portable 1 View  Result Date: 02/11/2021 CLINICAL DATA:  Shortness of breath. EXAM: PORTABLE CHEST 1 VIEW COMPARISON:  02/10/2021 FINDINGS: Normal sized heart. Clear lungs with normal vascularity. Cervical spine fixation hardware. IMPRESSION: No active disease. Electronically Signed   By: Claudie Revering M.D.   On: 02/11/2021 17:34   DG Chest Portable 1 View  Result Date: 02/10/2021 CLINICAL DATA:  Onset shortness of breath last night. EXAM: PORTABLE CHEST 1 VIEW COMPARISON:  PA and lateral chest 09/22/2020.  09/08/2019.  CT chest FINDINGS: Lungs clear. Heart size normal. No pneumothorax or pleural fluid. No acute or focal bony abnormality. Cervical fusion hardware is partially imaged. IMPRESSION: Negative chest. Electronically Signed   By: Inge Rise M.D.   On: 02/10/2021 13:06    Procedures Procedures   Medications Ordered in ED Medications  sodium chloride 0.9 % bolus 1,000 mL (0 mLs Intravenous Stopped 02/10/21 1258)  insulin aspart (novoLOG) injection 8 Units (8 Units Intravenous Given 02/10/21 1336)  iohexol (OMNIPAQUE) 350 MG/ML injection 75 mL (75 mLs Intravenous Contrast Given 02/10/21 1554)    ED Course  I have reviewed the triage vital signs and the nursing notes.  Pertinent labs & imaging results that were available during my care of the patient were reviewed by me and considered in my medical decision making (see chart for details).    MDM  Rules/Calculators/A&P                           55 year old female with a history of type 2 diabetes, cirrhosis secondary to NASH, hypothyroidism, who presents with concern for dyspnea and increased thirst after steroid injection yesterday.    Hyperglycemia on labs without signs of DKA VBG obtained shows respiratory alkalosis.  Given dyspnea without DKA, or anemia as explanation, ordered ddimer and troponin to evaluate for other etiologies.  DDimer positive and CTA ordered and pending. Initial troponin negative, will plan recheck. She does have risk factors for CAD however in setting of other factors leading to presentation today such as hyperglycemia, thirst, anxiety (which may be contributing to tachypnea on arrival)  feel outpatient cardiology follow up is reasonable if CTA and troponins negative.  Plan to recheck troponin, glucose, PE study.    Final Clinical Impression(s) / ED Diagnoses Final diagnoses:  Hyperglycemia    Rx / DC Orders ED Discharge Orders     None        Gareth Morgan, MD 02/12/21 1225

## 2021-02-10 NOTE — ED Provider Notes (Signed)
Patient here with elevated blood sugar.  Had steroid injection in her knee yesterday and blood sugar was elevated.  She is not in DKA.  Blood sugar improving.  Recommend dietary changes in the interim to help avoid any higher spikes in her blood sugar.  Cardiac work-up was done as she has been having some chest pressure that she attributes to her anxiety.  PE study was overall unremarkable.  Troponin negative x2.  EKG shows sinus rhythm with no ischemic changes.  CTA did show some atherosclerotic disease in the coronaries which she was made aware of.  She is not having any exertional type chest pain.  Overall shared decision was made to have her follow-up closely with cardiology as I do believe she would benefit from more provocative testing to further evaluate for CAD.  She understands return precautions and was discharged in ED in good condition.  Given information to follow-up with cardiology.  Recommend follow-up with primary care doctor to make any further adjustments to diabetes medication.  This chart was dictated using voice recognition software.  Despite best efforts to proofread,  errors can occur which can change the documentation meaning.    Lennice Sites, DO 02/10/21 1706

## 2021-02-10 NOTE — ED Notes (Signed)
CRITICAL VALUE STICKER  CRITICAL VALUE:Glucose 512  RECEIVER (on-site recipient of call):Shawnie Pons, RN  DATE & TIME NOTIFIED: 02-10-2021 1228  MESSENGER (representative from lab):  MD NOTIFIED: Dr. Billy Fischer  TIME OF NOTIFICATION:1229  RESPONSE:

## 2021-02-11 ENCOUNTER — Emergency Department (HOSPITAL_BASED_OUTPATIENT_CLINIC_OR_DEPARTMENT_OTHER)
Admission: EM | Admit: 2021-02-11 | Discharge: 2021-02-11 | Disposition: A | Discharge status: Home / Self Care | Payer: 59 | Attending: Emergency Medicine | Admitting: Emergency Medicine

## 2021-02-11 ENCOUNTER — Encounter (HOSPITAL_BASED_OUTPATIENT_CLINIC_OR_DEPARTMENT_OTHER): Payer: Self-pay

## 2021-02-11 ENCOUNTER — Telehealth: Payer: 59 | Admitting: Family Medicine

## 2021-02-11 ENCOUNTER — Encounter: Payer: Self-pay | Admitting: Family Medicine

## 2021-02-11 ENCOUNTER — Emergency Department (HOSPITAL_BASED_OUTPATIENT_CLINIC_OR_DEPARTMENT_OTHER): Payer: 59

## 2021-02-11 DIAGNOSIS — R109 Unspecified abdominal pain: Secondary | ICD-10-CM | POA: Diagnosis not present

## 2021-02-11 DIAGNOSIS — Z20822 Contact with and (suspected) exposure to covid-19: Secondary | ICD-10-CM | POA: Insufficient documentation

## 2021-02-11 DIAGNOSIS — Z794 Long term (current) use of insulin: Secondary | ICD-10-CM

## 2021-02-11 DIAGNOSIS — E1165 Type 2 diabetes mellitus with hyperglycemia: Secondary | ICD-10-CM | POA: Insufficient documentation

## 2021-02-11 DIAGNOSIS — Z96642 Presence of left artificial hip joint: Secondary | ICD-10-CM | POA: Insufficient documentation

## 2021-02-11 DIAGNOSIS — Z87891 Personal history of nicotine dependence: Secondary | ICD-10-CM | POA: Diagnosis not present

## 2021-02-11 DIAGNOSIS — R519 Headache, unspecified: Secondary | ICD-10-CM | POA: Diagnosis present

## 2021-02-11 DIAGNOSIS — R739 Hyperglycemia, unspecified: Secondary | ICD-10-CM | POA: Insufficient documentation

## 2021-02-11 DIAGNOSIS — E039 Hypothyroidism, unspecified: Secondary | ICD-10-CM | POA: Insufficient documentation

## 2021-02-11 DIAGNOSIS — Z79899 Other long term (current) drug therapy: Secondary | ICD-10-CM | POA: Insufficient documentation

## 2021-02-11 DIAGNOSIS — Z6841 Body Mass Index (BMI) 40.0 and over, adult: Secondary | ICD-10-CM

## 2021-02-11 DIAGNOSIS — R0602 Shortness of breath: Secondary | ICD-10-CM | POA: Insufficient documentation

## 2021-02-11 DIAGNOSIS — Z7984 Long term (current) use of oral hypoglycemic drugs: Secondary | ICD-10-CM | POA: Insufficient documentation

## 2021-02-11 DIAGNOSIS — I10 Essential (primary) hypertension: Secondary | ICD-10-CM | POA: Insufficient documentation

## 2021-02-11 DIAGNOSIS — Z85819 Personal history of malignant neoplasm of unspecified site of lip, oral cavity, and pharynx: Secondary | ICD-10-CM | POA: Insufficient documentation

## 2021-02-11 LAB — COMPREHENSIVE METABOLIC PANEL
ALT: 35 U/L (ref 0–44)
AST: 38 U/L (ref 15–41)
Albumin: 3.8 g/dL (ref 3.5–5.0)
Alkaline Phosphatase: 172 U/L — ABNORMAL HIGH (ref 38–126)
Anion gap: 9 (ref 5–15)
BUN: 19 mg/dL (ref 6–20)
CO2: 24 mmol/L (ref 22–32)
Calcium: 8.8 mg/dL — ABNORMAL LOW (ref 8.9–10.3)
Chloride: 98 mmol/L (ref 98–111)
Creatinine, Ser: 0.99 mg/dL (ref 0.44–1.00)
GFR, Estimated: >60 mL/min (ref >60)
Glucose, Bld: 532 mg/dL (ref 70–99)
Potassium: 4 mmol/L (ref 3.5–5.1)
Sodium: 131 mmol/L — ABNORMAL LOW (ref 135–145)
Total Bilirubin: 0.1 mg/dL — ABNORMAL LOW (ref 0.3–1.2)
Total Protein: 7 g/dL (ref 6.5–8.1)

## 2021-02-11 LAB — URINALYSIS, MICROSCOPIC (REFLEX): RBC / HPF: NONE SEEN RBC/hpf (ref 0–5)

## 2021-02-11 LAB — CBC WITH DIFFERENTIAL/PLATELET
Abs Immature Granulocytes: 0.04 10*3/uL (ref 0.00–0.07)
Basophils Absolute: 0 10*3/uL (ref 0.0–0.1)
Basophils Relative: 0 %
Eosinophils Absolute: 0 10*3/uL (ref 0.0–0.5)
Eosinophils Relative: 0 %
HCT: 33.5 % — ABNORMAL LOW (ref 36.0–46.0)
Hemoglobin: 10.7 g/dL — ABNORMAL LOW (ref 12.0–15.0)
Immature Granulocytes: 1 %
Lymphocytes Relative: 22 %
Lymphs Abs: 1.1 10*3/uL (ref 0.7–4.0)
MCH: 25.7 pg — ABNORMAL LOW (ref 26.0–34.0)
MCHC: 31.9 g/dL (ref 30.0–36.0)
MCV: 80.5 fL (ref 80.0–100.0)
Monocytes Absolute: 0.3 10*3/uL (ref 0.1–1.0)
Monocytes Relative: 7 %
Neutro Abs: 3.4 10*3/uL (ref 1.7–7.7)
Neutrophils Relative %: 70 %
Platelets: 155 10*3/uL (ref 150–400)
RBC: 4.16 MIL/uL (ref 3.87–5.11)
RDW: 21.7 % — ABNORMAL HIGH (ref 11.5–15.5)
Smear Review: NORMAL
WBC: 4.9 10*3/uL (ref 4.0–10.5)
nRBC: 0 % (ref 0.0–0.2)

## 2021-02-11 LAB — I-STAT VENOUS BLOOD GAS, ED
Acid-Base Excess: 1 mmol/L (ref 0.0–2.0)
Bicarbonate: 26 mmol/L (ref 20.0–28.0)
Calcium, Ion: 1.2 mmol/L (ref 1.15–1.40)
HCT: 35 % — ABNORMAL LOW (ref 36.0–46.0)
Hemoglobin: 11.9 g/dL — ABNORMAL LOW (ref 12.0–15.0)
O2 Saturation: 76 %
Patient temperature: 98.6
Potassium: 4.1 mmol/L (ref 3.5–5.1)
Sodium: 135 mmol/L (ref 135–145)
TCO2: 27 mmol/L (ref 22–32)
pCO2, Ven: 42.6 mmHg — ABNORMAL LOW (ref 44.0–60.0)
pH, Ven: 7.394 (ref 7.250–7.430)
pO2, Ven: 41 mmHg (ref 32.0–45.0)

## 2021-02-11 LAB — URINALYSIS, ROUTINE W REFLEX MICROSCOPIC
Bilirubin Urine: NEGATIVE
Glucose, UA: >=500 mg/dL — AB
Hgb urine dipstick: NEGATIVE
Ketones, ur: NEGATIVE mg/dL
Leukocytes,Ua: NEGATIVE
Nitrite: NEGATIVE
Protein, ur: NEGATIVE mg/dL
Specific Gravity, Urine: 1.01 (ref 1.005–1.030)
pH: 6 (ref 5.0–8.0)

## 2021-02-11 LAB — CBG MONITORING, ED
Glucose-Capillary: 546 mg/dL (ref 70–99)
Glucose-Capillary: 399 mg/dL — ABNORMAL HIGH (ref 70–99)
Glucose-Capillary: 412 mg/dL — ABNORMAL HIGH (ref 70–99)

## 2021-02-11 LAB — RESP PANEL BY RT-PCR (FLU A&B, COVID) ARPGX2
Influenza A by PCR: NEGATIVE
Influenza B by PCR: NEGATIVE
SARS Coronavirus 2 by RT PCR: NEGATIVE

## 2021-02-11 LAB — TROPONIN I (HIGH SENSITIVITY): Troponin I (High Sensitivity): 4 ng/L (ref <18)

## 2021-02-11 LAB — LIPASE, BLOOD: Lipase: 47 U/L (ref 11–51)

## 2021-02-11 MED ORDER — KETOROLAC TROMETHAMINE 30 MG/ML IJ SOLN
30.0000 mg | Freq: Once | INTRAMUSCULAR | Status: AC
  Administered 2021-02-11: 30 mg via INTRAVENOUS
  Filled 2021-02-11: qty 1

## 2021-02-11 MED ORDER — INSULIN ASPART 100 UNIT/ML IJ SOLN
10.0000 [IU] | Freq: Once | INTRAMUSCULAR | Status: AC
  Administered 2021-02-11: 10 [IU] via SUBCUTANEOUS

## 2021-02-11 MED ORDER — FREESTYLE LITE TEST VI STRP: ORAL_STRIP | 12 refills | Status: DC

## 2021-02-11 MED ORDER — SODIUM CHLORIDE 0.9 % IV BOLUS
1000.0000 mL | Freq: Once | INTRAVENOUS | Status: AC
  Administered 2021-02-11: 1000 mL via INTRAVENOUS

## 2021-02-11 MED ORDER — ACETAMINOPHEN 325 MG PO TABS
650.0000 mg | ORAL_TABLET | Freq: Once | ORAL | Status: AC
  Administered 2021-02-11: 650 mg via ORAL
  Filled 2021-02-11: qty 2

## 2021-02-11 NOTE — Discharge Instructions (Addendum)
You will need to call your regular doctor tomorrow to schedule an appointment for follow-up.  You may need to have your insulin dosing adjusted if your blood sugars remain persistently elevated.  You will need to return the emergency department for any new or worsening symptoms in the meantime.

## 2021-02-11 NOTE — ED Triage Notes (Signed)
Pt c/o SOB, abdominal pain and hyperglycemia. Was seen at [facility] yesterday.

## 2021-02-11 NOTE — ED Notes (Signed)
ED Provider at bedside. 

## 2021-02-11 NOTE — Progress Notes (Signed)
[name] are scheduled for a virtual visit with your provider today.    Just as we do with appointments in the office, we must obtain your consent to participate.  Your consent will be active for this visit and any virtual visit you may have with one of our providers in the next 365 days.    If you have a MyChart account, I can also send a copy of this consent to you electronically.  All virtual visits are billed to your insurance company just like a traditional visit in the office.  As this is a virtual visit, video technology does not allow for your provider to perform a traditional examination.  This may limit your provider's ability to fully assess your condition.  If your provider identifies any concerns that need to be evaluated in person or the need to arrange testing such as labs, EKG, etc, we will make arrangements to do so.    Although advances in technology are sophisticated, we cannot ensure that it will always work on either your end or our end.  If the connection with a video visit is poor, we may have to switch to a telephone visit.  With either a video or telephone visit, we are not always able to ensure that we have a secure connection.   I need to obtain your verbal consent now.   Are you willing to proceed with your visit today?   Tracy [name] has provided verbal consent on 02/11/2021 for a virtual visit (video or telephone).   Tracy Mayo, NP 02/11/2021  10:05 AM   Date:  02/11/2021   ID:  Tracy [name], DOB [birth year], MRN [id number]  Patient Location: Home Provider Location: Home Office   Participants: Patient and Provider for Visit and Wrap up  Method of visit: Video  Location of Patient: Home Location of Provider: Home Office Consent was obtain for visit over the video. Services rendered by provider: Visit was performed via video  A video enabled telemedicine application was used and I verified that I am speaking with the correct person using two  identifiers.  PCP:  Luetta Nutting, DO   Chief Complaint:  elevated BS   History of Present Illness:    Tracy [name] is a 55 y.o. female with history as stated below. Presents video telehealth for an acute care visit secondary to ongoing elevation in blood sugar. Was seen at [facility] ED yesterday for same concern. She had a steroid injection in her knee on Friday and blood sugar has been elevated.  She was not in DKA after work up.  Blood sugar improving at time of D/c from ED.    Per Ed note: She was recommend dietary changes in the interim to help avoid any higher spikes in her blood sugar.  Cardiac work-up was done as she has been having some chest pressure that she attributes to her anxiety.  PE study was overall unremarkable.  Troponin negative x2.  EKG shows sinus rhythm with no ischemic changes.  CTA did show some atherosclerotic disease in the coronaries which she was made aware of.  She is not having any exertional type chest pain.  Overall shared decision was made to have her follow-up closely with cardiology as I do believe she would benefit from more provocative testing to further evaluate for CAD.  She understands return precautions and was discharged in ED in good condition.  Given information to follow-up with cardiology.  Recommend follow-up with primary care  doctor to make any further adjustments to diabetes medication.  Today she is anxious as her BS is above 500 per her meter. She is not wanting to go back to ED but wants advice. Reports feeling a little sick on stomach now. Has had 96 oz of water per her. Denies dizziness, but reports feeling off and a little confused.  Denies sweating, diarrhea, vision changes, shortness of breath, or other signs of hyperglycemia.   Past Medical, Surgical, Social History, Allergies, and Medications have been Reviewed.  Past Medical History:  Diagnosis Date   Anxiety    Arthritis    back, knees, right elbow   Back pain     Bilateral swelling of feet and ankles    Bipolar disorder (HCC)    Borderline personality disorder (HCC)    Cancer (HCC)    oral   Chewing difficulty    Cirrhosis (HCC)    Constipation    Dental crowns present    Depression    Diabetes mellitus without complication ([place])    Fatty liver    Food allergy    Walnuts   GAD (generalized anxiety disorder)    GERD (gastroesophageal reflux disease)    High cholesterol    no current med.   History of kidney stones    History of migraine headaches    Hypertension    under control with meds., has been on med. x 4 yr.   Hypothyroidism    IBS (irritable bowel syndrome)    Infertility, female    Joint pain    Lichen planus    Liver cirrhosis secondary to NASH (nonalcoholic steatohepatitis) (HCC)    NASH (nonalcoholic steatohepatitis)    Osteoarthritis    Other fatigue    Palpitations    PCOS (polycystic ovarian syndrome)    PONV (postoperative nausea and vomiting)    also hx. of emergence delirium 2007   Shortness of breath on exertion    Sleep apnea    uses CPAP nightly   Trigeminal neuralgia    Type 2 diabetes mellitus ([place])    Vitamin D deficiency     No outpatient medications have been marked as taking for the 02/11/21 encounter (Appointment) with Tracy Mayo, NP.     Allergies:   Sulfa antibiotics, Food, Bupropion hcl, Codeine, and Sulfamethoxazole-trimethoprim   ROS See HPI for history of present illness.  Physical Exam HENT:     Head: Normocephalic.     Nose: Nose normal.  Eyes:     Conjunctiva/sclera: Conjunctivae normal.  Pulmonary:     Effort: Pulmonary effort is normal.  Musculoskeletal:     Cervical back: Normal range of motion.  Neurological:     General: No focal deficit present.     Mental Status: She is alert.              A&P  1. Type 2 diabetes mellitus with hyperglycemia, with long-term current use of insulin ([place]) -given S&S and discussion she is encourgaed to report back to ED -Patient  acknowledged agreement and understanding of the plan.   2. Morbid obesity with BMI of 40.0-44.9, adult ([place]) She is encouraged to reach out to PCP for additional treatment plans to help   3. Elevated blood sugar -given S&S and discussion she is encourgaed to report back to ED -Patient acknowledged agreement and understanding of the plan.    Time:   Today, I have spent 10 minutes with the patient with telehealth technology discussing the above problems, reviewing  the chart, previous notes, medications and orders.   Medication Changes: No orders of the defined types were placed in this encounter.    Disposition:  Follow up ED  Signed, Tracy Mayo, NP  02/11/2021 10:05 AM

## 2021-02-11 NOTE — Patient Instructions (Signed)
Please report to the nearest ED for in person evaluation as discussed during visit

## 2021-02-11 NOTE — ED Provider Notes (Signed)
[facility] DEPARTMENT Provider Note   CSN: [id number] Arrival date & time: 02/11/21  1417     History Chief Complaint  Patient presents with   Hyperglycemia   Shortness of Breath    Tracy [name] is a 55 y.o. female.  HPI   Pt is a 55 y/o female with a h/o anxiety, arthritis, back pain, bipolar disorder, borderline personality disorder, CA, chirrhosis, DM, fatty liver, GAD, GERD, nephrolithiasis, IBS, sleep apnea, DM, who presents to the ED today for eval of hyperglycemia. States she checked her BS at home and it has been over 500. She reports headache, abd pain, fatigue, polyuria, and sob.   States she had a steroid injection in her knee a few days ago and since then bs have been elevated. She reports compliance with her DM medications.   Denies fevers, chills, cough, vomiting, diarrhea.  Past Medical History:  Diagnosis Date   Anxiety    Arthritis    back, knees, right elbow   Back pain    Bilateral swelling of feet and ankles    Bipolar disorder (HCC)    Borderline personality disorder (HCC)    Cancer (HCC)    oral   Chewing difficulty    Cirrhosis (HCC)    Constipation    Dental crowns present    Depression    Diabetes mellitus without complication ([place])    Fatty liver    Food allergy    Walnuts   GAD (generalized anxiety disorder)    GERD (gastroesophageal reflux disease)    High cholesterol    no current med.   History of kidney stones    History of migraine headaches    Hypertension    under control with meds., has been on med. x 4 yr.   Hypothyroidism    IBS (irritable bowel syndrome)    Infertility, female    Joint pain    Lichen planus    Liver cirrhosis secondary to NASH (nonalcoholic steatohepatitis) (HCC)    NASH (nonalcoholic steatohepatitis)    Osteoarthritis    Other fatigue    Palpitations    PCOS (polycystic ovarian syndrome)    PONV (postoperative nausea and vomiting)    also hx. of emergence delirium 2007    Shortness of breath on exertion    Sleep apnea    uses CPAP nightly   Trigeminal neuralgia    Type 2 diabetes mellitus ([place])    Vitamin D deficiency     Patient Active Problem List   Diagnosis Date Noted   Elevated blood sugar 02/11/2021   Diarrhea    Gastroesophageal reflux disease    Generalized abdominal pain    Nausea without vomiting    Gastritis and gastroduodenitis    Diverticulosis of colon without hemorrhage    Grade II internal hemorrhoids    Periumbilical pain 46/96/2952   Dyspepsia 12/14/2020   Leg swelling 09/21/2020   Morbid obesity with BMI of 40.0-44.9, adult ([place]) 06/23/2020   Chronic left shoulder pain 05/17/2020   Oral mucosal lesion 06/17/2019   Facial rash 06/16/2019   Healthcare maintenance 04/22/2019   UTI (urinary tract infection) 04/22/2019   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   Unilateral primary osteoarthritis, left hip 02/10/2019   Greater trochanteric bursitis of left hip 07/24/2018   Arthritis of left hip 06/25/2018   Primary osteoarthritis of left knee 01/21/2018   Encounter for general adult medical examination with abnormal findings 01/04/2018   Vitamin D deficiency 01/04/2018  Lower back pain 07/18/2017   Hemorrhoids, internal 07/18/2017   Trigeminal neuralgia 01/18/2016   Bipolar disorder ([place]) 11/28/2015   Borderline personality disorder ([place]) 11/28/2015   Carpal tunnel syndrome 09/06/2015   MDD (major depressive disorder) 06/06/2015   Severe episode of recurrent major depressive disorder, without psychotic features ([place])    History of posttraumatic stress disorder (PTSD)    GAD (generalized anxiety disorder)    Insulin resistance 02/09/2015   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/2015   Acanthosis nigricans, acquired 89/37/3428   Nonalcoholic fatty liver disease 02/09/2015   Female hirsutism 02/09/2015   Oligomenorrhea 02/09/2015   Infertility associated with anovulation 02/09/2015   Anxiety and depression 10/31/2014    Obstructive sleep apnea 06/27/2014   Diabetes mellitus, type 2 ([place]) 06/10/2013   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[place]) 01/01/2012   Cystic teratoma    PCOS (polycystic ovarian syndrome)    TOBACCO USE, QUIT 02/08/2010   HYPOTHYROIDISM, POSTSURGICAL 10/02/2007   POLYCYSTIC OVARIAN DISEASE 10/02/2007   Elevated cholesterol 10/02/2007   DEPRESSION 10/02/2007   Migraine 10/02/2007   Essential hypertension 10/02/2007   RENAL CALCULUS, HX OF 10/02/2007    Past Surgical History:  Procedure Laterality Date   ACHILLES TENDON SURGERY Left 2007   APPENDECTOMY     CARPAL TUNNEL RELEASE Right 01/12/2016   Procedure: RIGHT CARPAL TUNNEL RELEASE;  Surgeon: Roseanne Kaufman, MD;  Location: [facility];  Service: Orthopedics;  Laterality: Right;   COLONOSCOPY WITH PROPOFOL N/A 04/20/2014   Procedure: COLONOSCOPY WITH PROPOFOL;  Surgeon: Arta Silence, MD;  Location: [facility];  Service: Endoscopy;  Laterality: N/A;   COLONOSCOPY WITH PROPOFOL N/A 01/10/2021   Procedure: COLONOSCOPY WITH PROPOFOL;  Surgeon: Lavena Bullion, DO;  Location: [facility];  Service: Gastroenterology;  Laterality: N/A;   ESOPHAGOGASTRODUODENOSCOPY (EGD) WITH PROPOFOL N/A 04/20/2014   Procedure: ESOPHAGOGASTRODUODENOSCOPY (EGD) WITH PROPOFOL;  Surgeon: Arta Silence, MD;  Location: [facility];  Service: Endoscopy;  Laterality: N/A;   ESOPHAGOGASTRODUODENOSCOPY (EGD) WITH PROPOFOL N/A 01/10/2021   Procedure: ESOPHAGOGASTRODUODENOSCOPY (EGD) WITH PROPOFOL;  Surgeon: Lavena Bullion, DO;  Location: [facility];  Service: Gastroenterology;  Laterality: N/A;   EXCISION HAGLUND'S DEFORMITY WITH ACHILLES TENDON REPAIR Right 02/25/2013   Procedure: RIGHT ACHILLES DEBRIDEMENT AND RECONSTRUCTION;  HAGLUND'S EXCISION; GASTROC RECESSION AND FLEXOR HALLUCIS LONGUS TRANSFER;  Surgeon: Wylene Simmer, MD;  Location: [facility];  Service: Orthopedics;  Laterality: Right;   JOINT REPLACEMENT     hip   LUMBAR LAMINECTOMY      X 3   neck fusion     OVARIAN CYST SURGERY Left 1998   PILONIDAL CYST EXCISION  1994   RIGHT OOPHORECTOMY     STERIOD INJECTION Left 01/12/2016   Procedure: STEROID INJECTION LEFT WRIST;  Surgeon: Roseanne Kaufman, MD;  Location: [facility];  Service: Orthopedics;  Laterality: Left;   TOTAL HIP ARTHROPLASTY Left 03/19/2019   Procedure: LEFT TOTAL HIP ARTHROPLASTY ANTERIOR APPROACH;  Surgeon: Mcarthur Rossetti, MD;  Location: [facility];  Service: Orthopedics;  Laterality: Left;   TOTAL THYROIDECTOMY  2003   UPPER GI ENDOSCOPY  01/19/2015     OB History     Gravida  0   Para      Term      Preterm      AB      Living         SAB      IAB      Ectopic      Multiple      Live  Births              Family History  Problem Relation Age of Onset   Heart disease Father    Depression Father    Heart attack Father    Sudden death Father    Anxiety disorder Father    Bipolar disorder Father    Obesity Father    Cancer Brother        THROAT cancer   Depression Brother    Anxiety disorder Brother    Depression Sister    Breast cancer Mother    Hypertension Mother    Cancer Mother    Depression Mother    Sleep apnea Mother    Obesity Mother    Depression Maternal Grandmother    Stroke Maternal Grandfather    Colon cancer Brother    Pancreatic cancer Neg Hx    Stomach cancer Neg Hx    Liver disease Neg Hx     Social History   Tobacco Use   Smoking status: Former    Packs/day: 0.50    Years: 15.00    Pack years: 7.50    Types: Cigarettes    Quit date: 02/2019    Years since quitting: 2.0   Smokeless tobacco: Never  Vaping Use   Vaping Use: Never used  Substance Use Topics   Alcohol use: No    Alcohol/week: 0.0 standard drinks    Comment: Has not had any alcohol since 07/2014 - before this date, she rarely drink.   Drug use: No    Home Medications Prior to Admission medications   Medication Sig Start Date End Date Taking?  Authorizing Provider  glucose blood (FREESTYLE LITE) test strip Use as instructed 02/11/21  Yes Earnestine Tuohey S, PA-C  ALPRAZolam (XANAX XR) 0.5 MG 24 hr tablet TAKE 1 TABLET BY MOUTH 2 TIMES DAILY Patient taking differently: Take 0.5 mg by mouth 2 (two) times daily as needed for anxiety. 09/15/20 03/14/21  Noemi Chapel, NP  AMBULATORY NON FORMULARY MEDICATION Convertible/stand-up workstation such as "[employer]." 02/09/21   Silverio Decamp, MD  ARIPiprazole (ABILIFY) 10 MG tablet Take 10 mg by mouth at bedtime. 09/15/20   [provider]  Blood Glucose Monitoring Suppl (FREESTYLE LITE) w/Device KIT USE TO CHECK BLOOD SUGAR ONCE A DAY 10/16/20 10/16/21  Luetta Nutting, DO  Blood Glucose Monitoring Suppl (FREESTYLE LITE) w/Device KIT USE TO CHECK BLOOD SUGAR ONCE A DAY 09/22/20 09/22/21  Luetta Nutting, DO  clonazePAM (KLONOPIN) 1 MG tablet Take 1 mg by mouth 3 (three) times daily.  02/03/17   [provider]  cyclobenzaprine (FLEXERIL) 10 MG tablet TAKE 1 TABLET BY MOUTH 2 TIMES DAILY AS NEEDED FOR MUSCLE SPASMS Patient taking differently: Take 10 mg by mouth 2 (two) times daily as needed for muscle spasms. 01/03/21 01/03/22  Luetta Nutting, DO  famotidine (PEPCID) 40 MG tablet Take 1 tablet (40 mg total) by mouth 2 (two) times daily. 12/27/20   Cirigliano, Vito V, DO  ferrous sulfate 325 (65 FE) MG EC tablet Take 1 tablet (325 mg total) by mouth in the morning and at bedtime. Take iron supplement 2 hours before or 4 hours after PPI or other antacids as this requires some level of gastric acidity to aid in absorption 12/29/20   Cirigliano, Vito V, DO  folic acid (FOLVITE) 1 MG tablet Take 1 tablet (1 mg total) by mouth daily. Patient taking differently: Take 1 mg by mouth in the morning. 12/29/20   Cirigliano, Dominic Pea,  DO  furosemide (LASIX) 40 MG tablet TAKE 1 TABLET BY MOUTH DAILY. Patient taking differently: Take 40 mg by mouth in the morning. 10/11/20 10/11/21  Luetta Nutting, DO  glucose blood  test strip USE TO CHECK BLOOD SUGAR ONCE A DAY Patient taking differently: USE TO CHECK BLOOD SUGAR ONCE A DAY 09/22/20 09/22/21  Luetta Nutting, DO  hydrOXYzine (ATARAX/VISTARIL) 25 MG tablet Take 1 tablet (25 mg total) by mouth 3 (three) times daily as needed. Patient taking differently: Take 25 mg by mouth at bedtime as needed (sleep). 11/08/20   Mozingo, Berdie Ogren, NP  insulin degludec (TRESIBA FLEXTOUCH) 100 UNIT/ML FlexTouch Pen Inject 20 Units into the skin daily. Patient taking differently: Inject 20 Units into the skin in the morning. 12/28/20 03/02/21  Luetta Nutting, DO  Insulin Pen Needle (PENTIPS) 31G X 5 MM MISC Use to inject insulin daily Patient taking differently: Use to inject insulin daily 12/29/20   Luetta Nutting, DO  Lancets (FREESTYLE) lancets USE TO TEST BLOOD SUGAR ONCE A DAY Patient taking differently: USE TO TEST BLOOD SUGAR ONCE A DAY 09/22/20 09/22/21  Luetta Nutting, DO  lidocaine (XYLOCAINE) 5 % ointment APPLY 1 G TOPICALLY 3 (THREE) TIMES DAILY BEFORE MEALS (DOESNT COME IN GEL, FAXED DR) Patient taking differently: Apply 1 application topically 3 (three) times daily as needed (oral sores). 08/10/20 08/10/21    lisinopril-hydrochlorothiazide (ZESTORETIC) 10-12.5 MG tablet TAKE 1 TABLET BY MOUTH ONCE DAILY Patient taking differently: Take 1 tablet by mouth in the morning. 05/17/20 05/17/21  Silverio Decamp, MD  meloxicam (MOBIC) 15 MG tablet One tab PO qAM with a meal for 2 weeks, then daily prn pain. 02/09/21   Silverio Decamp, MD  NUVIGIL 200 MG TABS Take 200 mg by mouth daily as needed (sleepiness). 12/29/19   [provider]  nystatin (MYCOSTATIN/NYSTOP) powder Apply 3 times daily as directed. Patient taking differently: Apply 1 application topically daily. 12/01/20   Luetta Nutting, DO  ondansetron (ZOFRAN-ODT) 4 MG disintegrating tablet Take 4 mg by mouth 2 (two) times daily as needed for nausea or vomiting. 02/05/20   [provider]   pimecrolimus (ELIDEL) 1 % cream APPLY TOPICALLY 2 TIMES DAILY TO COMPLETELY DRY AREA PLACE A THIN LAYER ONTO THE MOST SORE AREAS PRIOR TO BED. DO NOT RINSE Patient taking differently: Apply 1 application topically 2 (two) times daily as needed (mouth sores). 09/14/20 09/14/21    RABEprazole (ACIPHEX) 20 MG tablet Take 1 tablet (20 mg total) by mouth in the morning and at bedtime. 12/22/20 12/22/21  Luetta Nutting, DO  rizatriptan (MAXALT) 10 MG tablet Take 1 tablet (10 mg total) by mouth as needed for migraine. May repeat in 2 hours if needed Patient taking differently: Take 10 mg by mouth every 2 (two) hours as needed for migraine. May repeat in 2 hours if needed 12/23/17   Binnie Rail, MD  rosuvastatin (CRESTOR) 20 MG tablet TAKE 1 TABLET BY MOUTH DAILY. Patient taking differently: Take 20 mg by mouth in the morning. 03/10/20 04/23/21  Luetta Nutting, DO  sertraline (ZOLOFT) 100 MG tablet TAKE 2 TABLETS BY MOUTH EVERY MORNING 01/31/21 01/31/22  Mozingo, Berdie Ogren, NP  Sod Picosulfate-Mag Ox-Cit Acd (CLENPIQ) 10-3.5-12 MG-GM -GM/160ML SOLN Take 1 kit by mouth as directed. 12/27/20   Cirigliano, Vito V, DO  sucralfate (CARAFATE) 1 g tablet Take 1 tablet (1 g total) by mouth 4 (four) times daily -  with meals and at bedtime for 15 days. 12/14/20 01/09/21  Zigmund Daniel,  [place], DO  SYNTHROID 300 MCG tablet TAKE 1 TABLET BY MOUTH ONCE A DAY BEFORE BREAKFAST Patient taking differently: Take 300 mcg by mouth daily before breakfast. 10/12/20 10/12/21  Luetta Nutting, DO  Vitamin D, Ergocalciferol, (DRISDOL) 1.25 MG (50000 UNIT) CAPS capsule TAKE 1 CAPSULE BY MOUTH EVERY 7 DAYS. Patient taking differently: Take 50,000 Units by mouth every Tuesday. 12/01/20 12/01/21  Luetta Nutting, DO    Allergies    Sulfa antibiotics, Food, Bupropion hcl, Codeine, and Sulfamethoxazole-trimethoprim  Review of Systems   Review of Systems  Constitutional:  Positive for fatigue. Negative for chills and fever.  HENT:  Negative for ear  pain and sore throat.   Eyes:  Negative for pain and visual disturbance.  Respiratory:  Positive for shortness of breath. Negative for cough.   Cardiovascular:  Negative for chest pain.  Gastrointestinal:  Positive for abdominal pain. Negative for constipation, diarrhea, nausea and vomiting.  Endocrine:       Hyperglycemia  Genitourinary:  Negative for dysuria and hematuria.  Musculoskeletal:  Negative for back pain.  Skin:  Negative for rash.  Neurological:  Negative for headaches.  All other systems reviewed and are negative.  Physical Exam Updated Vital Signs BP 113/65 (BP Location: Right Arm)   Pulse 67   Temp 98.2 F (36.8 C) (Oral)   Resp 15   Ht 5' 11"  (1.803 m)   Wt (!) 149.7 kg   LMP 08/24/2020   SpO2 99%   BMI 46.03 kg/m   Physical Exam Vitals and nursing note reviewed.  Constitutional:      General: She is not in acute distress.    Appearance: She is well-developed.  HENT:     Head: Normocephalic and atraumatic.     Mouth/Throat:     Mouth: Mucous membranes are dry.  Eyes:     Conjunctiva/sclera: Conjunctivae normal.  Cardiovascular:     Rate and Rhythm: Normal rate and regular rhythm.     Heart sounds: Normal heart sounds. No murmur heard. Pulmonary:     Effort: Pulmonary effort is normal. No respiratory distress.     Breath sounds: Normal breath sounds. No decreased breath sounds, wheezing, rhonchi or rales.  Abdominal:     General: Bowel sounds are normal.     Palpations: Abdomen is soft.     Tenderness: There is no abdominal tenderness. There is no guarding or rebound.  Musculoskeletal:     Cervical back: Neck supple.  Skin:    General: Skin is warm and dry.  Neurological:     Mental Status: She is alert.    ED Results / Procedures / Treatments   Labs (all labs ordered are listed, but only abnormal results are displayed) Labs Reviewed  CBC WITH DIFFERENTIAL/PLATELET - Abnormal; Notable for the following components:      Result Value    Hemoglobin 10.7 (*)    HCT 33.5 (*)    MCH 25.7 (*)    RDW 21.7 (*)    All other components within normal limits  COMPREHENSIVE METABOLIC PANEL - Abnormal; Notable for the following components:   Sodium 131 (*)    Glucose, Bld 532 (*)    Calcium 8.8 (*)    Alkaline Phosphatase 172 (*)    Total Bilirubin 0.1 (*)    All other components within normal limits  URINALYSIS, ROUTINE W REFLEX MICROSCOPIC - Abnormal; Notable for the following components:   Color, Urine STRAW (*)    Glucose, UA >=500 (*)    All  other components within normal limits  URINALYSIS, MICROSCOPIC (REFLEX) - Abnormal; Notable for the following components:   Bacteria, UA RARE (*)    All other components within normal limits  CBG MONITORING, ED - Abnormal; Notable for the following components:   Glucose-Capillary 546 (*)    All other components within normal limits  I-STAT VENOUS BLOOD GAS, ED - Abnormal; Notable for the following components:   pCO2, Ven 42.6 (*)    HCT 35.0 (*)    Hemoglobin 11.9 (*)    All other components within normal limits  CBG MONITORING, ED - Abnormal; Notable for the following components:   Glucose-Capillary 399 (*)    All other components within normal limits  CBG MONITORING, ED - Abnormal; Notable for the following components:   Glucose-Capillary 412 (*)    All other components within normal limits  RESP PANEL BY RT-PCR (FLU A&B, COVID) ARPGX2  LIPASE, BLOOD  TROPONIN I (HIGH SENSITIVITY)    EKG EKG Interpretation  Date/Time:  Sunday February 11 2021 14:25:23 EDT Ventricular Rate:  79 PR Interval:  164 QRS Duration: 92 QT Interval:  381 QTC Calculation: 437 R Axis:   55 Text Interpretation: Sinus rhythm Ventricular trigeminy Minimal ST depression, inferior leads overall similar to yesterday besides PVCs Confirmed by Sherwood Gambler [id number]) on 02/11/2021 2:33:44 PM  Radiology CT Angio Chest PE W and/or Wo Contrast  Result Date: 02/10/2021 CLINICAL DATA:  Shortness of breath  after steroid injection in knee yesterday. Elevated blood sugar. EXAM: CT ANGIOGRAPHY CHEST WITH CONTRAST TECHNIQUE: Multidetector CT imaging of the chest was performed using the standard protocol during bolus administration of intravenous contrast. Multiplanar CT image reconstructions and MIPs were obtained to evaluate the vascular anatomy. CONTRAST:  74m OMNIPAQUE IOHEXOL 350 MG/ML SOLN COMPARISON:  Chest radiograph of earlier today. CTA chest 09/22/2020 FINDINGS: Cardiovascular: The quality of this exam for evaluation of pulmonary embolism is poor. The bolus is suboptimally timed, with contrast centered in the SVC. Exam also degraded by patient body habitus. No central/main pulmonary emboli. No large lobar emboli. Smaller emboli cannot be excluded. normal aortic caliber. Tortuous thoracic aorta. Mild cardiomegaly, without pericardial effusion. Lad coronary artery calcification. Mediastinum/Nodes: No mediastinal or hilar adenopathy. Lungs/Pleura: No pleural fluid.  Clear imaged lungs. Upper Abdomen: Hepatic steatosis and cirrhosis are moderate. Normal imaged portions of the stomach, pancreas, gallbladder, left adrenal gland, kidneys. 1.0 cm right adrenal nodule is similar back to 05/05/2019 and can be presumed a benign lesion, likely an adenoma. Borderline splenomegaly, 13.0 cm craniocaudal. Musculoskeletal: Cervical spine fixation. Review of the MIP images confirms the above findings. IMPRESSION: 1. Poor quality evaluation for pulmonary embolism. No central embolism identified. Smaller emboli cannot be excluded. 2. Age advanced coronary artery atherosclerosis. Recommend assessment of coronary risk factors and consideration of medical therapy. 3. Cirrhosis and hepatic steatosis.  Borderline splenomegaly. 4. Right adrenal nodule is likely an adenoma and is similar to 2020. Electronically Signed   By: KAbigail MiyamotoM.D.   On: 02/10/2021 16:50   DG Chest Portable 1 View  Result Date: 02/11/2021 CLINICAL DATA:   Shortness of breath. EXAM: PORTABLE CHEST 1 VIEW COMPARISON:  02/10/2021 FINDINGS: Normal sized heart. Clear lungs with normal vascularity. Cervical spine fixation hardware. IMPRESSION: No active disease. Electronically Signed   By: SClaudie ReveringM.D.   On: 02/11/2021 17:34   DG Chest Portable 1 View  Result Date: 02/10/2021 CLINICAL DATA:  Onset shortness of breath last night. EXAM: PORTABLE CHEST 1 VIEW COMPARISON:  PA and  lateral chest 09/22/2020.  09/08/2019.  CT chest FINDINGS: Lungs clear. Heart size normal. No pneumothorax or pleural fluid. No acute or focal bony abnormality. Cervical fusion hardware is partially imaged. IMPRESSION: Negative chest. Electronically Signed   By: Inge Rise M.D.   On: 02/10/2021 13:06    Procedures Procedures   6:26 PM Cardiac monitoring reveals NSR, HR 80s (Rate & rhythm), as reviewed and interpreted by me. Cardiac monitoring was ordered due to sob and to monitor patient for dysrhythmia.   Medications Ordered in ED Medications  sodium chloride 0.9 % bolus 1,000 mL (0 mLs Intravenous Stopped 02/11/21 1626)  insulin aspart (novoLOG) injection 10 Units (10 Units Subcutaneous Given 02/11/21 1658)  ketorolac (TORADOL) 30 MG/ML injection 30 mg (30 mg Intravenous Given 02/11/21 1657)  acetaminophen (TYLENOL) tablet 650 mg (650 mg Oral Given 02/11/21 1657)  insulin aspart (novoLOG) injection 10 Units (10 Units Subcutaneous Given 02/11/21 1815)    ED Course  I have reviewed the triage vital signs and the nursing notes.  Pertinent labs & imaging results that were available during my care of the patient were reviewed by me and considered in my medical decision making (see chart for details).    MDM Rules/Calculators/A&P                          55 y/o F presenting for eval of hyperglycemia  Reviewed/interpreted labs CBC with mild anemia which appears chronic CMP with elevated bs, normal bicarb and no elevated anion gap to suggest dka Lipase neg Trop  neg UA neg, no ketones or signs of infection VBG unremarkable COVID negative  EKG with NSR, ventricular trigemina, minimal ST depression in the inferior leads. Similar to yesterday  Of note, pt seen in the ED yesterday and records reviewed. She had a very thorough w/u including a labs, CXR, and CTA of the chest which were all reassuring. She had no evidence of DKA, ACS, infectious process or other emergent diagnosis that would have required admission.   Pt was give ivf, and on recheck bs improved to 399. She then received 10 units of insulin and was also given cheese, on recheck her bs was 412. Pt was then given an additional 12 units of insulin and was discharged by nursing staff prior to repeat cbg being obtained which I was not aware of. Pt unlikely to develop hypoglycemia from this dose of insulin. We did have a long discussion prior to discharge about the need for close f/u with her pcp and discussed strict return precautions. She voices understanding of the plan and reasons to return. All questions were answered  Final Clinical Impression(s) / ED Diagnoses Final diagnoses:  Hyperglycemia    Rx / DC Orders ED Discharge Orders          Ordered    glucose blood (FREESTYLE LITE) test strip        02/11/21 1757             Ryen Rhames S, PA-C 02/11/21 1827    Sherwood Gambler, MD 02/12/21 [phone]

## 2021-02-12 ENCOUNTER — Encounter: Payer: Self-pay | Admitting: Family Medicine

## 2021-02-13 ENCOUNTER — Ambulatory Visit: Payer: 59 | Admitting: Family Medicine

## 2021-02-13 DIAGNOSIS — M1712 Unilateral primary osteoarthritis, left knee: Secondary | ICD-10-CM

## 2021-02-14 ENCOUNTER — Ambulatory Visit: Payer: 59 | Admitting: Family Medicine

## 2021-02-14 ENCOUNTER — Other Ambulatory Visit (HOSPITAL_BASED_OUTPATIENT_CLINIC_OR_DEPARTMENT_OTHER): Payer: Self-pay

## 2021-02-14 ENCOUNTER — Other Ambulatory Visit: Payer: Self-pay

## 2021-02-14 ENCOUNTER — Encounter: Payer: Self-pay | Admitting: Family Medicine

## 2021-02-14 VITALS — BP 149/82 | HR 72 | Temp 97.9°F | Ht 71.0 in | Wt 335.0 lb

## 2021-02-14 DIAGNOSIS — E1165 Type 2 diabetes mellitus with hyperglycemia: Secondary | ICD-10-CM

## 2021-02-14 DIAGNOSIS — R002 Palpitations: Secondary | ICD-10-CM | POA: Diagnosis not present

## 2021-02-14 MED ORDER — TRESIBA FLEXTOUCH 100 UNIT/ML ~~LOC~~ SOPN
30.0000 [IU] | PEN_INJECTOR | Freq: Every day | SUBCUTANEOUS | 2 refills | Status: DC
  Filled 2021-02-14 – 2021-03-11 (×3): qty 27, 90d supply, fill #0
  Filled 2021-04-13: qty 27, 90d supply, fill #1

## 2021-02-14 MED ORDER — NOVOLOG FLEXPEN 100 UNIT/ML ~~LOC~~ SOPN
PEN_INJECTOR | SUBCUTANEOUS | 11 refills | Status: DC
  Filled 2021-02-14 (×2): qty 15, 32d supply, fill #0

## 2021-02-14 NOTE — Patient Instructions (Signed)
Increase tresiba to 30 units daily.  Add novolog per sliding scale.  See me again in 1 month.   INSULIN SLIDING SCALE DOSE  60 - 110 No insulin  111 - 150 : 4 units  151 - 200:  8 units 201 - 250:  10 units  251 - 300:  12 units 301 - 350:  14 units  >350:  16 units

## 2021-02-14 NOTE — Progress Notes (Signed)
Tracy [name] - 55 y.o. female MRN [id number]  Date of birth: 09/21/1965  Subjective Chief Complaint  Patient presents with   Diabetes    HPI [name] is a 55 year old female here today for ER follow-up.  She was recently seen in the ER due to hyperglycemia that occurred after receiving a steroid injection in her knee.  Her blood sugar was over 500 when she was seen in the ER.  She received fluids and additional insulin with improvement of her blood sugars.  Her blood sugars have looked better over the past few days but still are not well controlled.  She does still feel pretty fatigued and has some dry mouth and increased thirst.  She denies any other symptoms including fever, chills or other signs of infection at this time.  She is using prescribed medications as directed.  She was also noted to have some increased PVCs on telemetry monitoring.  She does feel some fluttering from time to time and thought this was related to her anxiety.  It was recommended that she follow-up with cardiology.    ROS:  A comprehensive ROS was completed and negative except as noted per HPI  Allergies  Allergen Reactions   Sulfa Antibiotics Itching, Swelling and Rash   Food     Walnut-mouth swelling/itching   Bupropion Hcl Itching and Rash   Codeine Itching   Sulfamethoxazole-Trimethoprim Rash    Past Medical History:  Diagnosis Date   Anxiety    Arthritis    back, knees, right elbow   Back pain    Bilateral swelling of feet and ankles    Bipolar disorder (HCC)    Borderline personality disorder ([place])    Cancer (HCC)    oral   Chewing difficulty    Cirrhosis (HCC)    Constipation    Dental crowns present    Depression    Diabetes mellitus without complication ([place])    Fatty liver    Food allergy    Walnuts   GAD (generalized anxiety disorder)    GERD (gastroesophageal reflux disease)    High cholesterol    no current med.   History of kidney stones    History of migraine  headaches    Hypertension    under control with meds., has been on med. x 4 yr.   Hypothyroidism    IBS (irritable bowel syndrome)    Infertility, female    Joint pain    Lichen planus    Liver cirrhosis secondary to NASH (nonalcoholic steatohepatitis) (HCC)    NASH (nonalcoholic steatohepatitis)    Osteoarthritis    Other fatigue    Palpitations    PCOS (polycystic ovarian syndrome)    PONV (postoperative nausea and vomiting)    also hx. of emergence delirium 2007   Shortness of breath on exertion    Sleep apnea    uses CPAP nightly   Trigeminal neuralgia    Type 2 diabetes mellitus ([place])    Vitamin D deficiency     Past Surgical History:  Procedure Laterality Date   ACHILLES TENDON SURGERY Left 2007   APPENDECTOMY     CARPAL TUNNEL RELEASE Right 01/12/2016   Procedure: RIGHT CARPAL TUNNEL RELEASE;  Surgeon: Roseanne Kaufman, MD;  Location: [place];  Service: Orthopedics;  Laterality: Right;   COLONOSCOPY WITH PROPOFOL N/A 04/20/2014   Procedure: COLONOSCOPY WITH PROPOFOL;  Surgeon: Arta Silence, MD;  Location: [facility];  Service: Endoscopy;  Laterality: N/A;   COLONOSCOPY WITH  PROPOFOL N/A 01/10/2021   Procedure: COLONOSCOPY WITH PROPOFOL;  Surgeon: Lavena Bullion, DO;  Location: [facility];  Service: Gastroenterology;  Laterality: N/A;   ESOPHAGOGASTRODUODENOSCOPY (EGD) WITH PROPOFOL N/A 04/20/2014   Procedure: ESOPHAGOGASTRODUODENOSCOPY (EGD) WITH PROPOFOL;  Surgeon: Arta Silence, MD;  Location: [facility];  Service: Endoscopy;  Laterality: N/A;   ESOPHAGOGASTRODUODENOSCOPY (EGD) WITH PROPOFOL N/A 01/10/2021   Procedure: ESOPHAGOGASTRODUODENOSCOPY (EGD) WITH PROPOFOL;  Surgeon: Lavena Bullion, DO;  Location: [facility];  Service: Gastroenterology;  Laterality: N/A;   EXCISION HAGLUND'S DEFORMITY WITH ACHILLES TENDON REPAIR Right 02/25/2013   Procedure: RIGHT ACHILLES DEBRIDEMENT AND RECONSTRUCTION;  HAGLUND'S EXCISION; GASTROC RECESSION AND  FLEXOR HALLUCIS LONGUS TRANSFER;  Surgeon: Wylene Simmer, MD;  Location: [place];  Service: Orthopedics;  Laterality: Right;   JOINT REPLACEMENT     hip   LUMBAR LAMINECTOMY     X 3   neck fusion     OVARIAN CYST SURGERY Left 1998   PILONIDAL CYST EXCISION  1994   RIGHT OOPHORECTOMY     STERIOD INJECTION Left 01/12/2016   Procedure: STEROID INJECTION LEFT WRIST;  Surgeon: Roseanne Kaufman, MD;  Location: [place];  Service: Orthopedics;  Laterality: Left;   TOTAL HIP ARTHROPLASTY Left 03/19/2019   Procedure: LEFT TOTAL HIP ARTHROPLASTY ANTERIOR APPROACH;  Surgeon: Mcarthur Rossetti, MD;  Location: [facility];  Service: Orthopedics;  Laterality: Left;   TOTAL THYROIDECTOMY  2003   UPPER GI ENDOSCOPY  01/19/2015    Social History   Socioeconomic History   Marital status: Divorced    Spouse name: Not on file   Number of children: 0   Years of education: 16   Highest education level: Not on file  Occupational History   Occupation: RN-Telephone Copy    Employer: [employer]    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   Drug use: No   Sexual activity: Not Currently    Partners: Male    Birth control/protection: Condom  Other Topics Concern   Not on file  Social History Narrative   RN -    Regular exercise: no   Caffeine use: 2 x daily   Right-handed.   Lives alone.   Social Determinants of Health   Financial Resource Strain: Not on file  Food Insecurity: Not on file  Transportation Needs: Not on file  Physical  Activity: Not on file  Stress: Not on file  Social Connections: Not on file    Family History  Problem Relation Age of Onset   Heart disease Father    Depression Father    Heart attack Father    Sudden death Father    Anxiety disorder Father    Bipolar disorder Father    Obesity Father    Cancer Brother        THROAT cancer   Depression Brother    Anxiety disorder Brother    Depression Sister    Breast cancer Mother  Hypertension Mother    Cancer Mother    Depression Mother    Sleep apnea Mother    Obesity Mother    Depression Maternal Grandmother    Stroke Maternal Grandfather    Colon cancer Brother    Pancreatic cancer Neg Hx    Stomach cancer Neg Hx    Liver disease Neg Hx     Health Maintenance  Topic Date Due   FOOT EXAM  Never done   HIV Screening  Never done   Hepatitis C Screening  Never done   Zoster Vaccines- Shingrix (1 of 2) Never done   PAP SMEAR-Modifier  Never done   Pneumococcal Vaccine 54-54 Years old (2 - PCV) 05/06/2015   MAMMOGRAM  10/28/2017   OPHTHALMOLOGY EXAM  03/28/2020   INFLUENZA VACCINE  03/05/2021   COVID-19 Vaccine (5 - Booster for Pfizer series) 03/10/2021   HEMOGLOBIN A1C  06/28/2021   TETANUS/TDAP  01/03/2028   COLONOSCOPY (Pts 45-51yrs Insurance coverage will need to be confirmed)  01/11/2031   PNEUMOCOCCAL POLYSACCHARIDE VACCINE AGE 93-64 HIGH RISK  Completed   HPV VACCINES  Aged Out     ----------------------------------------------------------------------------------------------------------------------------------------------------------------------------------------------------------------- Physical Exam BP (!) 149/82 (BP Location: Left Arm, Patient Position: Sitting, Cuff Size: Normal)   Pulse 72   Temp 97.9 F (36.6 C)   Ht 5\' 11"  (1.803 m)   Wt (!) 335 lb (152 kg)   LMP 08/24/2020   SpO2 96%   BMI 46.72 kg/m   Physical Exam Constitutional:      Appearance: Normal appearance.  Eyes:     General: No scleral  icterus. Cardiovascular:     Rate and Rhythm: Normal rate and regular rhythm.  Pulmonary:     Effort: Pulmonary effort is normal.     Breath sounds: Normal breath sounds.  Musculoskeletal:     Cervical back: Neck supple.  Neurological:     General: No focal deficit present.     Mental Status: She is alert.  Psychiatric:        Mood and Affect: Mood normal.        Behavior: Behavior normal.    ------------------------------------------------------------------------------------------------------------------------------------------------------------------------------------------------------------------- Assessment and Plan  Diabetes mellitus, type 2 Recent hyperglycemia related to steroid injection.  We discussed continue to work on dietary habits to improve her blood sugars.  We will have her increase her Tresiba to 30 units daily.  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  Rechecking BMP and CBC today.  Palpitations She has had some palpitations that have been intermittent.  Some PVCs noted on telemetry in the ED.  She does wish to see a cardiologist and I will place referral.   Meds ordered this encounter  Medications   insulin aspart (NOVOLOG FLEXPEN) 100 UNIT/ML FlexPen    Sig: Inject up to 16 units 3 times daily per sliding scale    Dispense:  15 mL    Refill:  11   insulin degludec (TRESIBA FLEXTOUCH) 100 UNIT/ML FlexTouch Pen    Sig: Inject 30 Units into the skin daily.    Dispense:  27 mL    Refill:  2    Return in about 4 weeks (around 03/14/2021) for Diabetes.    This visit occurred during the SARS-CoV-2 public health emergency.  Safety protocols were in place, including screening questions prior to the visit, additional usage of staff PPE, and extensive cleaning of exam room while observing appropriate  contact time as indicated for disinfecting solutions.

## 2021-02-14 NOTE — Assessment & Plan Note (Signed)
She has had some palpitations that have been intermittent.  Some PVCs noted on telemetry in the ED.  She does wish to see a cardiologist and I will place referral.

## 2021-02-14 NOTE — Assessment & Plan Note (Addendum)
Recent hyperglycemia related to steroid injection.  We discussed continue to work on dietary habits to improve her blood sugars.  We will have her increase her Tresiba to 30 units daily.  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  Rechecking BMP and CBC today.

## 2021-02-15 ENCOUNTER — Other Ambulatory Visit: Payer: Self-pay | Admitting: Family Medicine

## 2021-02-15 ENCOUNTER — Other Ambulatory Visit (HOSPITAL_BASED_OUTPATIENT_CLINIC_OR_DEPARTMENT_OTHER): Payer: Self-pay

## 2021-02-15 DIAGNOSIS — F3181 Bipolar II disorder: Secondary | ICD-10-CM | POA: Diagnosis not present

## 2021-02-15 LAB — CBC WITH DIFFERENTIAL/PLATELET
Absolute Monocytes: 304 cells/uL (ref 200–950)
Basophils Absolute: 22 cells/uL (ref 0–200)
Basophils Relative: 0.5 %
Eosinophils Absolute: 141 cells/uL (ref 15–500)
Eosinophils Relative: 3.2 %
HCT: 38.4 % (ref 35.0–45.0)
Hemoglobin: 11.9 g/dL (ref 11.7–15.5)
Lymphs Abs: 1448 cells/uL (ref 850–3900)
MCH: 25.5 pg — ABNORMAL LOW (ref 27.0–33.0)
MCHC: 31 g/dL — ABNORMAL LOW (ref 32.0–36.0)
MCV: 82.2 fL (ref 80.0–100.0)
MPV: 10.8 fL (ref 7.5–12.5)
Monocytes Relative: 6.9 %
Neutro Abs: 2486 cells/uL (ref 1500–7800)
Neutrophils Relative %: 56.5 %
Platelets: 171 10*3/uL (ref 140–400)
RBC: 4.67 10*6/uL (ref 3.80–5.10)
RDW: 20 % — ABNORMAL HIGH (ref 11.0–15.0)
Total Lymphocyte: 32.9 %
WBC: 4.4 10*3/uL (ref 3.8–10.8)

## 2021-02-15 LAB — BASIC METABOLIC PANEL
BUN: 12 mg/dL (ref 7–25)
CO2: 26 mmol/L (ref 20–32)
Calcium: 8.7 mg/dL (ref 8.6–10.4)
Chloride: 106 mmol/L (ref 98–110)
Creat: 0.83 mg/dL (ref 0.50–1.03)
Glucose, Bld: 172 mg/dL — ABNORMAL HIGH (ref 65–99)
Potassium: 3.8 mmol/L (ref 3.5–5.3)
Sodium: 139 mmol/L (ref 135–146)

## 2021-02-15 MED ORDER — INSULIN LISPRO (1 UNIT DIAL) 100 UNIT/ML (KWIKPEN)
PEN_INJECTOR | SUBCUTANEOUS | 11 refills | Status: DC
  Filled 2021-02-15: qty 15, 30d supply, fill #0
  Filled 2021-04-21: qty 15, 30d supply, fill #1
  Filled 2021-07-11: qty 15, 30d supply, fill #2
  Filled 2021-12-29: qty 15, 30d supply, fill #3
  Filled 2022-01-25: qty 15, 30d supply, fill #4

## 2021-02-16 ENCOUNTER — Other Ambulatory Visit (HOSPITAL_COMMUNITY): Payer: Self-pay

## 2021-02-16 ENCOUNTER — Encounter: Payer: Self-pay | Admitting: Family Medicine

## 2021-02-16 NOTE — Assessment & Plan Note (Signed)
Pleasant 55 year old 55 year old female, knee is is doing a lot better after injection about a week ago, unfortunately, BS over 500, SSI with ED visits, next time we will just start with [facility].

## 2021-02-20 ENCOUNTER — Other Ambulatory Visit: Payer: Self-pay

## 2021-02-20 ENCOUNTER — Ambulatory Visit: Payer: 59 | Admitting: Cardiovascular Disease

## 2021-02-21 ENCOUNTER — Encounter: Payer: Self-pay | Admitting: Family Medicine

## 2021-02-21 DIAGNOSIS — F3181 Bipolar II disorder: Secondary | ICD-10-CM | POA: Diagnosis not present

## 2021-02-22 ENCOUNTER — Telehealth (INDEPENDENT_AMBULATORY_CARE_PROVIDER_SITE_OTHER): Payer: 59 | Admitting: Adult Health

## 2021-02-22 DIAGNOSIS — F411 Generalized anxiety disorder: Secondary | ICD-10-CM | POA: Diagnosis not present

## 2021-02-22 DIAGNOSIS — F4310 Post-traumatic stress disorder, unspecified: Secondary | ICD-10-CM

## 2021-02-22 DIAGNOSIS — F3181 Bipolar II disorder: Secondary | ICD-10-CM

## 2021-02-22 DIAGNOSIS — F410 Panic disorder [episodic paroxysmal anxiety] without agoraphobia: Secondary | ICD-10-CM

## 2021-02-22 NOTE — Progress Notes (Signed)
Tracy [name] 55 y.o.  Virtual Visit via Video Note  I connected with pt @ on 02/23/21 at  2:40 PM EDT by a video enabled telemedicine application and verified that I am speaking with the correct person using two identifiers.   I discussed the limitations of evaluation and management by telemedicine and the availability of in person appointments. The patient expressed understanding and agreed to proceed.  I discussed the assessment and treatment plan with the patient. The patient was provided an opportunity to ask questions and all were answered. The patient agreed with the plan and demonstrated an understanding of the instructions.   The patient was advised to call back or seek an in-person evaluation if the symptoms worsen or if the condition fails to improve as anticipated.  I provided 30 minutes of non-face-to-face time during this encounter.  The patient was located at home.  The provider was located at [facility].   Tracy Gell, NP   Subjective:   Patient ID:  Tracy [name] is a 55 y.o. (DOB 1965/10/02) female.  Chief Complaint: No chief complaint on file.   HPI Tracy [name] presents for follow-up of MDD, GAD, PTSD, and Panic attacks.  Describes mood today as "not so good". Pleasant. Tearful. Mood symptoms - reports depression and anxiety. Irritable at times. Reports panic attacks. Reports worry and rumination. Feeling overwhelmed. Feels like addition of Lybalvi was helpful initially. Not sure how she is feeling currently. Was seen at the ED twice over the weekend with elevated blood sugars. Is still trying to get them regulated. Feeling "poorly". Decreased interest and motivation. Taking medications as prescribed Energy levels poor. Active, does not have a regular exercise routine.  Enjoys some usual interests and activities. Recently divorced. Siblings local. Spending time with family. Appetite adequate. Weight stable.  Sleep  has improved. Averages 6 to 8 hours. Sleep apnea - taking Nuvigil in the morning.  Denies SI or HI.  Denies AH or VH.   Previous medication trials:  Antipsychotics: Vraylar-TD, Latuda, Abilify, Geodon, Rexulti  Mood Stabilizers - Depakote, Lithium, Lamictal, Equetro, Topamax, Trileptal, Gabapentin  SSRI - Zoloft, Lexapro, Celexa, Prozac, Viibryd, Trintellix, Prozac  SNRI - Pristiq, Effexor, Cymbalta  Wellbutrin - allergic reaction  Anti-anxiety - Buspar, Xanax, Clonazepam, Ativan, Valium  Sleep agents - Trazadone, Ambien, Restoril   Other - Deplin, Emsam, Serzone, Doxepin, Nuvigil, Vistaril  Previous treatment: ECT and TMS   Review of Systems:  Review of Systems  Constitutional:  Positive for fatigue.  Musculoskeletal:  Negative for gait problem.  Neurological:  Negative for tremors.  Psychiatric/Behavioral:         Please refer to HPI   Medications: I have reviewed the patient's current medications.  Current Outpatient Medications  Medication Sig Dispense Refill   ALPRAZolam (XANAX XR) 0.5 MG 24 hr tablet TAKE 1 TABLET BY MOUTH 2 TIMES DAILY (Patient taking differently: Take 0.5 mg by mouth 2 (two) times daily as needed for anxiety.) 180 tablet 0   AMBULATORY NON FORMULARY MEDICATION Convertible/stand-up workstation such as "[employer]." 1 each 0   Blood Glucose Monitoring Suppl (FREESTYLE LITE) w/Device KIT USE TO CHECK BLOOD SUGAR ONCE A DAY 1 kit 0   Blood Glucose Monitoring Suppl (FREESTYLE LITE) w/Device KIT USE TO CHECK BLOOD SUGAR ONCE A DAY 1 kit 0   clonazePAM (KLONOPIN) 1 MG tablet Take 1 mg by mouth 3 (three) times daily.   5   cyclobenzaprine (FLEXERIL) 10 MG tablet TAKE 1 TABLET  BY MOUTH 2 TIMES DAILY AS NEEDED FOR MUSCLE SPASMS (Patient taking differently: Take 10 mg by mouth 2 (two) times daily as needed for muscle spasms.) 20 tablet 0   famotidine (PEPCID) 40 MG tablet Take 1 tablet (40 mg total) by mouth 2 (two) times daily. 60 tablet 0   ferrous sulfate  325 (65 FE) MG EC tablet Take 1 tablet (325 mg total) by mouth in the morning and at bedtime. Take iron supplement 2 hours before or 4 hours after PPI or other antacids as this requires some level of gastric acidity to aid in absorption 60 tablet 5   folic acid (FOLVITE) 1 MG tablet Take 1 tablet (1 mg total) by mouth daily. (Patient taking differently: Take 1 mg by mouth in the morning.) 30 tablet 6   furosemide (LASIX) 40 MG tablet TAKE 1 TABLET BY MOUTH DAILY. (Patient taking differently: Take 40 mg by mouth in the morning.) 30 tablet 3   glucose blood (FREESTYLE LITE) test strip Use as instructed 100 each 12   glucose blood test strip USE TO CHECK BLOOD SUGAR ONCE A DAY (Patient taking differently: USE TO CHECK BLOOD SUGAR ONCE A DAY) 100 strip 12   hydrOXYzine (ATARAX/VISTARIL) 25 MG tablet Take 1 tablet (25 mg total) by mouth 3 (three) times daily as needed. (Patient taking differently: Take 25 mg by mouth at bedtime as needed (sleep).) 90 tablet 2   insulin degludec (TRESIBA FLEXTOUCH) 100 UNIT/ML FlexTouch Pen Inject 30 Units into the skin daily. 27 mL 2   insulin lispro (HUMALOG KWIKPEN) 100 UNIT/ML KwikPen Inject up to 16 units 3 times daily at mealtime per sliding scale. 15 mL 11   Insulin Pen Needle (PENTIPS) 31G X 5 MM MISC Use to inject insulin daily (Patient taking differently: Use to inject insulin daily) 100 each 3   Lancets (FREESTYLE) lancets USE TO TEST BLOOD SUGAR ONCE A DAY (Patient taking differently: USE TO TEST BLOOD SUGAR ONCE A DAY) 100 each 0   lidocaine (XYLOCAINE) 5 % ointment APPLY 1 G TOPICALLY 3 (THREE) TIMES DAILY BEFORE MEALS (DOESNT COME IN GEL, FAXED DR) (Patient taking differently: Apply 1 application topically 3 (three) times daily as needed (oral sores).) 1063.2 g 2   lisinopril-hydrochlorothiazide (ZESTORETIC) 10-12.5 MG tablet TAKE 1 TABLET BY MOUTH ONCE DAILY (Patient taking differently: Take 1 tablet by mouth in the morning.) 90 tablet 1   meloxicam (MOBIC) 15  MG tablet One tab PO qAM with a meal for 2 weeks, then daily prn pain. 30 tablet 3   NUVIGIL 200 MG TABS Take 200 mg by mouth daily as needed (sleepiness).     nystatin (MYCOSTATIN/NYSTOP) powder Apply 3 times daily as directed. (Patient taking differently: Apply 1 application topically daily.) 60 g 3   ondansetron (ZOFRAN-ODT) 4 MG disintegrating tablet Take 4 mg by mouth 2 (two) times daily as needed for nausea or vomiting.     pimecrolimus (ELIDEL) 1 % cream APPLY TOPICALLY 2 TIMES DAILY TO COMPLETELY DRY AREA PLACE A THIN LAYER ONTO THE MOST SORE AREAS PRIOR TO BED. DO NOT RINSE (Patient taking differently: Apply 1 application topically 2 (two) times daily as needed (mouth sores).) 60 g 0   RABEprazole (ACIPHEX) 20 MG tablet Take 1 tablet (20 mg total) by mouth in the morning and at bedtime. 180 tablet 1   rizatriptan (MAXALT) 10 MG tablet Take 1 tablet (10 mg total) by mouth as needed for migraine. May repeat in 2 hours if needed (Patient  taking differently: Take 10 mg by mouth every 2 (two) hours as needed for migraine. May repeat in 2 hours if needed) 10 tablet 0   rosuvastatin (CRESTOR) 20 MG tablet TAKE 1 TABLET BY MOUTH DAILY. (Patient taking differently: Take 20 mg by mouth in the morning.) 90 tablet 3   sertraline (ZOLOFT) 100 MG tablet TAKE 2 TABLETS BY MOUTH EVERY MORNING 180 tablet 3   Sod Picosulfate-Mag Ox-Cit Acd (CLENPIQ) 10-3.5-12 MG-GM -GM/160ML SOLN Take 1 kit by mouth as directed. 320 mL 0   sucralfate (CARAFATE) 1 g tablet Take 1 tablet (1 g total) by mouth 4 (four) times daily -  with meals and at bedtime for 15 days. 60 tablet 1   SYNTHROID 300 MCG tablet TAKE 1 TABLET BY MOUTH ONCE A DAY BEFORE BREAKFAST (Patient taking differently: Take 300 mcg by mouth daily before breakfast.) 90 tablet 1   Vitamin D, Ergocalciferol, (DRISDOL) 1.25 MG (50000 UNIT) CAPS capsule TAKE 1 CAPSULE BY MOUTH EVERY 7 DAYS. (Patient taking differently: Take 50,000 Units by mouth every Tuesday.) 12  capsule 1   No current facility-administered medications for this visit.    Medication Side Effects: None  Allergies:  Allergies  Allergen Reactions   Sulfa Antibiotics Itching, Swelling and Rash   Food     Walnut-mouth swelling/itching   Bupropion Hcl Itching and Rash   Codeine Itching   Sulfamethoxazole-Trimethoprim Rash    Past Medical History:  Diagnosis Date   Anxiety    Arthritis    back, knees, right elbow   Back pain    Bilateral swelling of feet and ankles    Bipolar disorder (HCC)    Borderline personality disorder ([place])    Cancer (HCC)    oral   Chewing difficulty    Cirrhosis (HCC)    Constipation    Dental crowns present    Depression    Diabetes mellitus without complication ([place])    Fatty liver    Food allergy    Walnuts   GAD (generalized anxiety disorder)    GERD (gastroesophageal reflux disease)    High cholesterol    no current med.   History of kidney stones    History of migraine headaches    Hypertension    under control with meds., has been on med. x 4 yr.   Hypothyroidism    IBS (irritable bowel syndrome)    Infertility, female    Joint pain    Lichen planus    Liver cirrhosis secondary to NASH (nonalcoholic steatohepatitis) (HCC)    NASH (nonalcoholic steatohepatitis)    Osteoarthritis    Other fatigue    Palpitations    PCOS (polycystic ovarian syndrome)    PONV (postoperative nausea and vomiting)    also hx. of emergence delirium 2007   Shortness of breath on exertion    Sleep apnea    uses CPAP nightly   Trigeminal neuralgia    Type 2 diabetes mellitus ([place])    Vitamin D deficiency     Family History  Problem Relation Age of Onset   Heart disease Father    Depression Father    Heart attack Father    Sudden death Father    Anxiety disorder Father    Bipolar disorder Father    Obesity Father    Cancer Brother        THROAT cancer   Depression Brother    Anxiety disorder Brother    Depression Sister    Breast  cancer  Mother    Hypertension Mother    Cancer Mother    Depression Mother    Sleep apnea Mother    Obesity Mother    Depression Maternal Grandmother    Stroke Maternal Grandfather    Colon cancer Brother    Pancreatic cancer Neg Hx    Stomach cancer Neg Hx    Liver disease Neg Hx     Social History   Socioeconomic History   Marital status: Divorced    Spouse name: Not on file   Number of children: 0   Years of education: 16   Highest education level: Not on file  Occupational History   Occupation: RN-Telephone Geophysical data processor: [employer]    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   Drug use: No   Sexual activity: Not Currently    Partners: Male    Birth control/protection: Condom  Other Topics Concern   Not on file  Social History Narrative   RN - [employer]   Regular exercise: no   Caffeine use: 2 x daily   Right-handed.   Lives alone.   Social Determinants of Health   Financial Resource Strain: Not on file  Food Insecurity: Not on file  Transportation Needs: Not on file  Physical Activity: Not on file  Stress: Not on file  Social Connections: Not on file  Intimate Partner Violence: Not on file    Past Medical History, Surgical history, Social history, and Family history were reviewed and updated as appropriate.   Please see review of systems for further details on the patient's review from today.   Objective:   Physical Exam:  LMP 08/24/2020   Physical Exam Constitutional:      General: She is not in acute  distress. Musculoskeletal:        General: No deformity.  Neurological:     Mental Status: She is alert and oriented to person, place, and time.     Coordination: Coordination normal.  Psychiatric:        Attention and Perception: Attention and perception normal. She does not perceive auditory or visual hallucinations.        Mood and Affect: Mood is not anxious or depressed. Affect is not labile, blunt, angry or inappropriate.        Speech: Speech normal.        Behavior: Behavior normal.        Thought Content: Thought content normal. Thought content is not paranoid or delusional. Thought content does not include homicidal or suicidal ideation. Thought content does not include homicidal or suicidal plan.        Cognition and Memory: Cognition and memory normal.        Judgment: Judgment normal.     Comments: Insight intact    Lab Review:     Component Value Date/Time   NA 139 02/14/2021 0000   NA 140 10/11/2020 1041   K 3.8 02/14/2021 0000  CL 106 02/14/2021 0000   CO2 26 02/14/2021 0000   GLUCOSE 172 (H) 02/14/2021 0000   BUN 12 02/14/2021 0000   BUN 14 10/11/2020 1041   CREATININE 0.83 02/14/2021 0000   CALCIUM 8.7 02/14/2021 0000   PROT 7.0 02/11/2021 1516   PROT 7.1 10/11/2020 1041   ALBUMIN 3.8 02/11/2021 1516   ALBUMIN 4.2 10/11/2020 1041   AST 38 02/11/2021 1516   ALT 35 02/11/2021 1516   ALKPHOS 172 (H) 02/11/2021 1516   BILITOT 0.1 (L) 02/11/2021 1516   BILITOT <0.2 10/11/2020 1041   GFRNONAA >60 02/11/2021 1516   GFRNONAA 99 12/26/2020 0000   GFRAA 115 12/26/2020 0000       Component Value Date/Time   WBC 4.4 02/14/2021 0000   RBC 4.67 02/14/2021 0000   HGB 11.9 02/14/2021 0000   HGB 10.9 (L) 10/11/2020 1041   HCT 38.4 02/14/2021 0000   HCT 35.6 10/11/2020 1041   PLT 171 02/14/2021 0000   PLT 224 10/11/2020 1041   MCV 82.2 02/14/2021 0000   MCV 78 (L) 10/11/2020 1041   MCH 25.5 (L) 02/14/2021 0000   MCHC 31.0 (L) 02/14/2021 0000   RDW 20.0 (H)  02/14/2021 0000   RDW 17.5 (H) 10/11/2020 1041   LYMPHSABS 1,448 02/14/2021 0000   LYMPHSABS 1.7 10/11/2020 1041   MONOABS 0.3 02/11/2021 1516   EOSABS 141 02/14/2021 0000   EOSABS 0.2 10/11/2020 1041   BASOSABS 22 02/14/2021 0000   BASOSABS 0.0 10/11/2020 1041    No results found for: POCLITH, LITHIUM   No results found for: PHENYTOIN, PHENOBARB, VALPROATE, CBMZ   .res Assessment: Plan:    Plan:  PDMP reviewed  D/C Lybalvi 73m at hs   Lamictal 2063mdaily Zoloft 10034m 2 daily Nuvigil 200m66mily  Clonazepam 1mg 63max XR 0.5mg  3mroxyzine 25mg T61m using 1 tablet at bedtime.   [employer]  Planning to start Spravato treatment  RTC 4 weeks  Has tried multiple medications without any long term relief of symptoms. Has also participated in TMS x 2Inglisd ECT. Felt like TMS wasAltonalpful initially and tried it a second time without further success. Does not feel current medications are offering symptom relief. Cuurently awaiting Spravato treatment.  Patient advised to contact office with any questions, adverse effects, or acute worsening in signs and symptoms.  Counseled patient regarding potential benefits, risks, and side effects of Lamictal to include potential risk of Stevens-Johnson syndrome. Advised patient to stop taking Lamictal and contact office immediately if rash develops and to seek urgent medical attention if rash is severe and/or spreading quickly.   Discussed potential benefits, risk, and side effects of benzodiazepines to include potential risk of tolerance and dependence, as well as possible drowsiness.  Advised patient not to drive if experiencing drowsiness and to take lowest possible effective dose to minimize risk of dependence and tolerance.  Discussed potential metabolic side effects associated with atypical antipsychotics, as well as potential risk for movement side effects. Advised pt to contact office if movement side effects occur.    Diagnoses and all orders for this visit:  GAD (generalized anxiety disorder)  Panic attacks  Bipolar II disorder (HCC)  PTSD (post-traumatic stress disorder)    Please see After Visit Summary for patient specific instructions.  Future Appointments  Date Time Provider [place]art[place]arth[place]2022  2:15 PM [facility]ke[facility]Silverio [facility] None  03/14/2021  3:10 PM MatthewLuetta [facility] None  03/21/2021  3:40 PM Revankar, Reita Cliche, MD [facility] None    No orders of the defined types were placed in this encounter.     -------------------------------

## 2021-02-23 ENCOUNTER — Encounter: Payer: Self-pay | Admitting: Adult Health

## 2021-02-25 ENCOUNTER — Encounter (INDEPENDENT_AMBULATORY_CARE_PROVIDER_SITE_OTHER): Payer: Self-pay | Admitting: Family Medicine

## 2021-02-25 ENCOUNTER — Encounter: Payer: Self-pay | Admitting: Family Medicine

## 2021-02-25 ENCOUNTER — Other Ambulatory Visit: Payer: Self-pay | Admitting: Gastroenterology

## 2021-02-26 ENCOUNTER — Other Ambulatory Visit (HOSPITAL_COMMUNITY): Payer: Self-pay

## 2021-02-26 ENCOUNTER — Other Ambulatory Visit (HOSPITAL_BASED_OUTPATIENT_CLINIC_OR_DEPARTMENT_OTHER): Payer: Self-pay

## 2021-02-26 MED ORDER — FAMOTIDINE 40 MG PO TABS
40.0000 mg | ORAL_TABLET | Freq: Two times a day (BID) | ORAL | 0 refills | Status: DC
  Filled 2021-02-26: qty 60, 30d supply, fill #0

## 2021-02-26 MED FILL — Levothyroxine Sodium Tab 300 MCG: ORAL | 90 days supply | Qty: 90 | Fill #0 | Status: AC

## 2021-02-26 MED FILL — Furosemide Tab 40 MG: ORAL | 30 days supply | Qty: 30 | Fill #0 | Status: AC

## 2021-02-26 NOTE — Telephone Encounter (Signed)
Last OV with Dr Opalski 

## 2021-02-27 ENCOUNTER — Other Ambulatory Visit (HOSPITAL_COMMUNITY): Payer: Self-pay

## 2021-02-27 ENCOUNTER — Telehealth: Payer: Self-pay

## 2021-02-27 DIAGNOSIS — R739 Hyperglycemia, unspecified: Secondary | ICD-10-CM | POA: Diagnosis not present

## 2021-02-27 DIAGNOSIS — Z886 Allergy status to analgesic agent status: Secondary | ICD-10-CM | POA: Diagnosis not present

## 2021-02-27 DIAGNOSIS — R002 Palpitations: Secondary | ICD-10-CM | POA: Diagnosis not present

## 2021-02-27 DIAGNOSIS — I10 Essential (primary) hypertension: Secondary | ICD-10-CM | POA: Diagnosis not present

## 2021-02-27 DIAGNOSIS — F4310 Post-traumatic stress disorder, unspecified: Secondary | ICD-10-CM | POA: Diagnosis not present

## 2021-02-27 DIAGNOSIS — R000 Tachycardia, unspecified: Secondary | ICD-10-CM | POA: Diagnosis not present

## 2021-02-27 DIAGNOSIS — R079 Chest pain, unspecified: Secondary | ICD-10-CM | POA: Diagnosis not present

## 2021-02-27 DIAGNOSIS — R0902 Hypoxemia: Secondary | ICD-10-CM | POA: Diagnosis not present

## 2021-02-27 DIAGNOSIS — R400 Somnolence: Secondary | ICD-10-CM | POA: Diagnosis not present

## 2021-02-27 DIAGNOSIS — F3181 Bipolar II disorder: Secondary | ICD-10-CM | POA: Diagnosis not present

## 2021-02-27 DIAGNOSIS — R531 Weakness: Secondary | ICD-10-CM | POA: Diagnosis not present

## 2021-02-27 DIAGNOSIS — Z888 Allergy status to other drugs, medicaments and biological substances status: Secondary | ICD-10-CM | POA: Diagnosis not present

## 2021-02-27 DIAGNOSIS — E119 Type 2 diabetes mellitus without complications: Secondary | ICD-10-CM | POA: Diagnosis not present

## 2021-02-27 DIAGNOSIS — E039 Hypothyroidism, unspecified: Secondary | ICD-10-CM | POA: Diagnosis not present

## 2021-02-27 DIAGNOSIS — Z794 Long term (current) use of insulin: Secondary | ICD-10-CM | POA: Diagnosis not present

## 2021-02-27 DIAGNOSIS — R42 Dizziness and giddiness: Secondary | ICD-10-CM | POA: Diagnosis not present

## 2021-02-27 DIAGNOSIS — R0689 Other abnormalities of breathing: Secondary | ICD-10-CM | POA: Diagnosis not present

## 2021-02-27 NOTE — Telephone Encounter (Signed)
Spoke with Dr. Zigmund Daniel who stated to advise pt to go directly to ED.  Advised pt to call 9-1-1 for transport to ED.  Pt expressed understanding and is agreeable.  Charyl Bigger, CMA

## 2021-02-27 NOTE — Telephone Encounter (Signed)
Spoke with Dr. Zigmund Daniel who advised that pt proceed to ED for evaluation.  Advised pt of recommendation and that she should call 9-1-1 for transport.  Pt expressed understanding and is agreeable.  Charyl Bigger, CMA

## 2021-02-28 ENCOUNTER — Other Ambulatory Visit (HOSPITAL_BASED_OUTPATIENT_CLINIC_OR_DEPARTMENT_OTHER): Payer: Self-pay

## 2021-02-28 ENCOUNTER — Other Ambulatory Visit (HOSPITAL_COMMUNITY): Payer: Self-pay

## 2021-03-05 ENCOUNTER — Other Ambulatory Visit (HOSPITAL_BASED_OUTPATIENT_CLINIC_OR_DEPARTMENT_OTHER): Payer: Self-pay

## 2021-03-11 ENCOUNTER — Encounter: Payer: Self-pay | Admitting: Family Medicine

## 2021-03-11 MED FILL — Glucose Blood Test Strip: 90 days supply | Qty: 100 | Fill #0 | Status: AC

## 2021-03-12 ENCOUNTER — Other Ambulatory Visit: Payer: Self-pay | Admitting: Family Medicine

## 2021-03-12 ENCOUNTER — Other Ambulatory Visit: Payer: Self-pay

## 2021-03-12 ENCOUNTER — Other Ambulatory Visit (HOSPITAL_COMMUNITY): Payer: Self-pay

## 2021-03-12 DIAGNOSIS — E1165 Type 2 diabetes mellitus with hyperglycemia: Secondary | ICD-10-CM

## 2021-03-12 MED ORDER — DEXCOM G6 RECEIVER DEVI
0 refills | Status: DC
  Filled 2021-03-12: qty 1, 365d supply, fill #0

## 2021-03-12 MED ORDER — DEXCOM G6 SENSOR MISC
1.0000 | 2 refills | Status: DC
  Filled 2021-03-12: qty 3, fill #0

## 2021-03-12 MED ORDER — DEXCOM G6 TRANSMITTER MISC
0 refills | Status: DC
  Filled 2021-03-12: qty 1, 90d supply, fill #0

## 2021-03-12 MED ORDER — DEXCOM G6 SENSOR MISC
1.0000 | 2 refills | Status: DC
  Filled 2021-03-12: qty 3, 30d supply, fill #0

## 2021-03-12 NOTE — Progress Notes (Signed)
RXs sent to pharmacy for Dexcom meter and supplies.  Pt informed.  Charyl Bigger, CMA

## 2021-03-13 ENCOUNTER — Other Ambulatory Visit (HOSPITAL_BASED_OUTPATIENT_CLINIC_OR_DEPARTMENT_OTHER): Payer: Self-pay

## 2021-03-13 ENCOUNTER — Telehealth (INDEPENDENT_AMBULATORY_CARE_PROVIDER_SITE_OTHER): Payer: 59 | Admitting: Psychology

## 2021-03-13 ENCOUNTER — Other Ambulatory Visit: Payer: Self-pay | Admitting: Family Medicine

## 2021-03-13 DIAGNOSIS — E1165 Type 2 diabetes mellitus with hyperglycemia: Secondary | ICD-10-CM

## 2021-03-13 MED ORDER — DEXCOM G6 SENSOR MISC
1 refills | Status: DC
  Filled 2021-03-13: qty 9, 90d supply, fill #0

## 2021-03-13 NOTE — Telephone Encounter (Signed)
LVM for patient as a reminder to see about gettting her labs done at the end of this month at the [facility] lab on the 2nd floor. The are open from 8am-4pm To be done after the 27th is great

## 2021-03-13 NOTE — Addendum Note (Signed)
Addended by: Curlene Labrum E on: 03/13/2021 03:04 PM   Modules accepted: Orders

## 2021-03-14 ENCOUNTER — Other Ambulatory Visit: Payer: Self-pay

## 2021-03-14 ENCOUNTER — Other Ambulatory Visit (HOSPITAL_COMMUNITY): Payer: Self-pay

## 2021-03-14 ENCOUNTER — Ambulatory Visit: Payer: 59 | Admitting: Family Medicine

## 2021-03-14 ENCOUNTER — Encounter: Payer: Self-pay | Admitting: Sports Medicine

## 2021-03-14 ENCOUNTER — Encounter: Payer: Self-pay | Admitting: Family Medicine

## 2021-03-14 ENCOUNTER — Ambulatory Visit: Payer: 59 | Admitting: Sports Medicine

## 2021-03-14 VITALS — BP 112/75 | HR 84

## 2021-03-14 DIAGNOSIS — F3181 Bipolar II disorder: Secondary | ICD-10-CM | POA: Diagnosis not present

## 2021-03-14 DIAGNOSIS — M1712 Unilateral primary osteoarthritis, left knee: Secondary | ICD-10-CM

## 2021-03-14 DIAGNOSIS — L680 Hirsutism: Secondary | ICD-10-CM

## 2021-03-14 DIAGNOSIS — E1165 Type 2 diabetes mellitus with hyperglycemia: Secondary | ICD-10-CM | POA: Diagnosis not present

## 2021-03-14 DIAGNOSIS — E119 Type 2 diabetes mellitus without complications: Secondary | ICD-10-CM

## 2021-03-14 MED ORDER — RYBELSUS 3 MG PO TABS: 3.0000 mg | ORAL_TABLET | Freq: Every day | ORAL | 0 refills | Status: DC

## 2021-03-14 MED ORDER — DEXCOM G6 SENSOR MISC
1 refills | Status: DC
  Filled 2021-03-14: qty 3, 30d supply, fill #0
  Filled 2021-03-22 – 2021-04-09 (×2): qty 3, 30d supply, fill #1
  Filled 2021-05-31: qty 3, 30d supply, fill #2
  Filled 2021-08-15: qty 3, 30d supply, fill #3
  Filled 2021-12-29: qty 3, 30d supply, fill #4
  Filled 2022-01-25: qty 3, 30d supply, fill #5

## 2021-03-14 MED ORDER — DEXCOM G6 RECEIVER DEVI
0 refills | Status: DC
  Filled 2021-03-14: qty 1, fill #0

## 2021-03-14 MED ORDER — DEXCOM G6 TRANSMITTER MISC
3 refills | Status: DC
  Filled 2021-03-14: qty 1, 90d supply, fill #0
  Filled 2021-03-22 – 2021-10-31 (×2): qty 1, 90d supply, fill #1
  Filled 2021-12-29 – 2022-01-25 (×2): qty 1, 90d supply, fill #2

## 2021-03-14 MED ORDER — RYBELSUS 3 MG PO TABS
3.0000 mg | ORAL_TABLET | Freq: Every day | ORAL | 0 refills | Status: DC
  Filled 2021-03-14: qty 30, 30d supply, fill #0

## 2021-03-14 MED ORDER — SPIRONOLACTONE 50 MG PO TABS
50.0000 mg | ORAL_TABLET | Freq: Two times a day (BID) | ORAL | 2 refills | Status: DC
  Filled 2021-03-14: qty 60, 30d supply, fill #0
  Filled 2021-04-17: qty 60, 30d supply, fill #1
  Filled 2021-04-21 – 2021-05-30 (×2): qty 60, 30d supply, fill #2

## 2021-03-14 NOTE — Patient Instructions (Addendum)
Great to see you today! Let's plan to follow up in 6 weeks to see how you are doing with medication change.

## 2021-03-14 NOTE — Assessment & Plan Note (Addendum)
Whitnie also has uncontrolled diabetes, last hemoglobin A1c in May was over 8. She is on [place] and sliding scale Humalog. She did have hyperglycemia secondary to a steroid injection I gave her about a month ago, I did discuss additional measures to help control her diabetes, including adding low-dose Rybelsus to start out with, she tried Ozempic but had significant and intolerable GI upset, we also changed her regimen for increasing her Tresiba by 2 units every time her morning blood sugar was over 120, she also has a continuous blood sugar monitor on the way. I completely forgot that she was seeing her PCP right after me and I have absolutely stepped all over his toes.

## 2021-03-14 NOTE — Assessment & Plan Note (Signed)
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 Unfortunately she ended up in the ED twice with blood sugars over 500, necessitating sliding scale insulin. If we do need to do another steroid injection we will certainly increase her dose of rapid acting insulin, versus simply transition straight to viscosupplementation.

## 2021-03-14 NOTE — Progress Notes (Signed)
    Procedures performed today:    None.  Independent interpretation of notes and tests performed by another provider:   None.  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 Unfortunately she ended up in the ED twice with blood sugars over 500, necessitating sliding scale insulin. If we do need to do another steroid injection we will certainly increase her dose of rapid acting insulin, versus simply transition straight to viscosupplementation.  Diabetes mellitus, type 2 Shylo also has uncontrolled diabetes, last hemoglobin A1c in May was over 8. She is on [place] and sliding scale Humalog. She did have hyperglycemia secondary to a steroid injection I gave her about a month ago, I did discuss additional measures to help control her diabetes, including adding low-dose Rybelsus to start out with, she tried Ozempic but had significant and intolerable GI upset, we also changed her regimen for increasing her Tresiba by 2 units every time her morning blood sugar was over 120, she also has a continuous blood sugar monitor on the way. I completely forgot that she was seeing her PCP right after me and I have absolutely stepped all over his toes.    ___________________________________________ [name], M.D., ABFM., CAQSM. Primary Care and [place] Instructor of [employer] [facility]

## 2021-03-14 NOTE — Patient Instructions (Signed)
Adding Rybelsus, please use coupon. Also increase Tresiba dose by 2 units every time to morning blood sugar is above 120.

## 2021-03-15 ENCOUNTER — Other Ambulatory Visit (HOSPITAL_COMMUNITY): Payer: Self-pay

## 2021-03-16 ENCOUNTER — Encounter: Payer: Self-pay | Admitting: Family Medicine

## 2021-03-16 ENCOUNTER — Ambulatory Visit: Payer: 59 | Admitting: Sports Medicine

## 2021-03-16 NOTE — Assessment & Plan Note (Signed)
Adding spironolactone 25 mg daily.  We can titrate as needed based on response.  She will return in 1 week to have BMP checked to be sure potassium is within normal limits.

## 2021-03-16 NOTE — Progress Notes (Signed)
Tracy [name] - 55 y.o. female MRN [id number]  Date of birth: 17-May-1966  Subjective Chief Complaint  Patient presents with   Follow-up    HPI Tracy [name] is a 55 year old female here today for follow-up of diabetes.  She has had significantly elevated blood sugar over the past few weeks that was exacerbated after receiving corticosteroid shot.  Sliding scale insulin was added at previous appointment however she continues to have hyperglycemia.  She did see Dr. Dianah Field for follow-up of knee pain and he had her increase her Tyler Aas as well as adding Rybelsus.  She did not tolerate Ozempic well previously but is hopeful she will be more tolerant on the Rybelsus.  She also is working on getting Dexcom G6 for continuous glucose monitoring as well.  She also is concerned about excess facial hair growth related to her PCOS.  Both suggest that she try spironolactone in the past however she declined.  She would like to try this at this point.  She continues to work on her mental health with her psychiatrist and therapist.  She feels that she is making progress with this.  ROS:  A comprehensive ROS was completed and negative except as noted per HPI  Allergies  Allergen Reactions   Sulfa Antibiotics Itching, Swelling and Rash   Food     Walnut-mouth swelling/itching   Bupropion Hcl Itching and Rash   Codeine Itching   Sulfamethoxazole-Trimethoprim Rash    Past Medical History:  Diagnosis Date   Anxiety    Arthritis    back, knees, right elbow   Back pain    Bilateral swelling of feet and ankles    Bipolar disorder (HCC)    Borderline personality disorder ([place])    Cancer (HCC)    oral   Chewing difficulty    Cirrhosis (HCC)    Constipation    Dental crowns present    Depression    Diabetes mellitus without complication ([place])    Fatty liver    Food allergy    Walnuts   GAD (generalized anxiety disorder)    GERD (gastroesophageal reflux disease)    High cholesterol    no current  med.   History of kidney stones    History of migraine headaches    Hypertension    under control with meds., has been on med. x 4 yr.   Hypothyroidism    IBS (irritable bowel syndrome)    Infertility, female    Joint pain    Lichen planus    Liver cirrhosis secondary to NASH (nonalcoholic steatohepatitis) (HCC)    NASH (nonalcoholic steatohepatitis)    Osteoarthritis    Other fatigue    Palpitations    PCOS (polycystic ovarian syndrome)    PONV (postoperative nausea and vomiting)    also hx. of emergence delirium 2007   Shortness of breath on exertion    Sleep apnea    uses CPAP nightly   Trigeminal neuralgia    Type 2 diabetes mellitus ([place])    Vitamin D deficiency     Past Surgical History:  Procedure Laterality Date   ACHILLES TENDON SURGERY Left 2007   APPENDECTOMY     CARPAL TUNNEL RELEASE Right 01/12/2016   Procedure: RIGHT CARPAL TUNNEL RELEASE;  Surgeon: Roseanne Kaufman, MD;  Location: [facility];  Service: Orthopedics;  Laterality: Right;   COLONOSCOPY WITH PROPOFOL N/A 04/20/2014   Procedure: COLONOSCOPY WITH PROPOFOL;  Surgeon: Arta Silence, MD;  Location: [facility];  Service: Endoscopy;  Laterality:  N/A;   COLONOSCOPY WITH PROPOFOL N/A 01/10/2021   Procedure: COLONOSCOPY WITH PROPOFOL;  Surgeon: Lavena Bullion, DO;  Location: [facility];  Service: Gastroenterology;  Laterality: N/A;   ESOPHAGOGASTRODUODENOSCOPY (EGD) WITH PROPOFOL N/A 04/20/2014   Procedure: ESOPHAGOGASTRODUODENOSCOPY (EGD) WITH PROPOFOL;  Surgeon: Arta Silence, MD;  [facility];  Service: Endoscopy;  Laterality: N/A;   ESOPHAGOGASTRODUODENOSCOPY (EGD) WITH PROPOFOL N/A 01/10/2021   Procedure: ESOPHAGOGASTRODUODENOSCOPY (EGD) WITH PROPOFOL;  Surgeon: Lavena Bullion, DO;  [facility];  Service: Gastroenterology;  Laterality: N/A;   EXCISION HAGLUND'S DEFORMITY WITH ACHILLES TENDON REPAIR Right 02/25/2013   Procedure: RIGHT ACHILLES DEBRIDEMENT AND  RECONSTRUCTION;  HAGLUND'S EXCISION; GASTROC RECESSION AND FLEXOR HALLUCIS LONGUS TRANSFER;  Surgeon: Wylene Simmer, MD;  Location: [facility];  Service: Orthopedics;  Laterality: Right;   JOINT REPLACEMENT     hip   LUMBAR LAMINECTOMY     X 3   neck fusion     OVARIAN CYST SURGERY Left 1998   PILONIDAL CYST EXCISION  1994   RIGHT OOPHORECTOMY     STERIOD INJECTION Left 01/12/2016   Procedure: STEROID INJECTION LEFT WRIST;  Surgeon: Roseanne Kaufman, MD;  Location: [facility];  Service: Orthopedics;  Laterality: Left;   TOTAL HIP ARTHROPLASTY Left 03/19/2019   Procedure: LEFT TOTAL HIP ARTHROPLASTY ANTERIOR APPROACH;  Surgeon: Mcarthur Rossetti, MD;  Location[facility];  Service: Orthopedics;  Laterality: Left;   TOTAL THYROIDECTOMY  2003   UPPER GI ENDOSCOPY  01/19/2015    Social History   Socioeconomic History   Marital status: Divorced    Spouse name: Not on file   Number of children: 0   Years of education: 16   Highest education level: Not on file  Occupational History   Occupation: RN-Telephone Copy    Employer: [employer]    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   Drug use: No   Sexual activity: Not Currently    Partners: Male    Birth control/protection: Condom  Other Topics Concern   Not on file  Social History Narrative   RN - [employer]   Regular exercise: no   Caffeine use: 2 x daily   Right-handed.   Lives alone.   Social Determinants of Health   Financial Resource Strain: Not on file  Food Insecurity: Not on  file  Transportation Needs: Not on file  Physical Activity: Not on file  Stress: Not on file  Social Connections: Not on file    Family History  Problem Relation Age of Onset   Heart disease Father    Depression Father    Heart attack Father    Sudden death Father    Anxiety disorder Father    Bipolar disorder Father    Obesity Father    Cancer Brother        THROAT cancer   Depression Brother    Anxiety disorder Brother    Depression Sister  Breast cancer Mother    Hypertension Mother    Cancer Mother    Depression Mother    Sleep apnea Mother    Obesity Mother    Depression Maternal Grandmother    Stroke Maternal Grandfather    Colon cancer Brother    Pancreatic cancer Neg Hx    Stomach cancer Neg Hx    Liver disease Neg Hx     Health Maintenance  Topic Date Due   HIV Screening  Never done   Hepatitis C Screening  Never done   Zoster Vaccines- Shingrix (1 of 2) Never done   PAP SMEAR-Modifier  Never done   Pneumococcal Vaccine 85-77 Years old (2 - PCV) 05/06/2015   MAMMOGRAM  10/28/2017   OPHTHALMOLOGY EXAM  03/28/2020   COVID-19 Vaccine (5 - Booster for Pfizer series) 03/10/2021   INFLUENZA VACCINE  03/05/2021   HEMOGLOBIN A1C  06/28/2021   FOOT EXAM  03/14/2022   TETANUS/TDAP  01/03/2028   COLONOSCOPY (Pts 45-32yr Insurance coverage will need to be confirmed)  01/11/2031   PNEUMOCOCCAL POLYSACCHARIDE VACCINE AGE 14-64 HIGH RISK  Completed   HPV VACCINES  Aged Out     ----------------------------------------------------------------------------------------------------------------------------------------------------------------------------------------------------------------- Physical Exam BP 112/75   Pulse 84   Ht '5\' 11"'$  (1.803 m)   Wt (!) 335 lb (152 kg)   LMP 08/24/2020   BMI 46.72 kg/m   Physical Exam Constitutional:      Appearance: Normal appearance.  HENT:     Head: Normocephalic and atraumatic.  Eyes:     General: No scleral  icterus. Cardiovascular:     Rate and Rhythm: Normal rate and regular rhythm.  Pulmonary:     Effort: Pulmonary effort is normal.     Breath sounds: Normal breath sounds.  Musculoskeletal:     Cervical back: Neck supple.  Neurological:     Mental Status: She is alert.  Psychiatric:        Mood and Affect: Mood normal.        Behavior: Behavior normal.    ------------------------------------------------------------------------------------------------------------------------------------------------------------------------------------------------------------------- Assessment and Plan  Female hirsutism Adding spironolactone 25 mg daily.  We can titrate as needed based on response.  She will return in 1 week to have BMP checked to be sure potassium is within normal limits.  Diabetes mellitus, type 2 Type 2 diabetes with hyperglycemia.  Dr. TDianah Fieldrecently added Rybelsus and is having her increase her Tresiba by 2 units each day that her blood glucose is greater than 120.Return in about 6 weeks (around 04/25/2021) for T2DM.   Meds ordered this encounter  Medications   Continuous Blood Gluc Sensor (DEXCOM G6 SENSOR) MISC    Sig: Replace every 10 days.  Use to check blood sugar.    Dispense:  9 each    Refill:  1   Continuous Blood Gluc Receiver (DKen Caryl DEVI    Sig: Use to monitor continuous blood glucose.    Dispense:  1 each    Refill:  0   Continuous Blood Gluc Transmit (DEXCOM G6 TRANSMITTER) MISC    Sig: Use to check continuous blood sugar.  Replace every 90 days    Dispense:  1 each    Refill:  3   spironolactone (ALDACTONE) 50 MG tablet    Sig: Take 1 tablet (50 mg total) by mouth 2 (two) times daily.    Dispense:  60 tablet    Refill:  2    Return in about 6 weeks (around 04/25/2021) for T2DM.  This visit occurred during the SARS-CoV-2 public health emergency.  Safety protocols were in place, including screening questions prior to the visit,  additional usage of staff PPE, and extensive cleaning of exam room while observing appropriate contact time as indicated for disinfecting solutions.

## 2021-03-16 NOTE — Assessment & Plan Note (Signed)
Type 2 diabetes with hyperglycemia.  Dr. Dianah Field recently added Rybelsus and is having her increase her Tresiba by 2 units each day that her blood glucose is greater than 120.Return in about 6 weeks (around 04/25/2021) for T2DM.

## 2021-03-19 ENCOUNTER — Other Ambulatory Visit (HOSPITAL_COMMUNITY): Payer: Self-pay

## 2021-03-20 NOTE — Telephone Encounter (Signed)
Medication: Dexcom G6  Prior authorization submitted via CoverMyMeds [place] submission pending Pt aware via MyChart message

## 2021-03-21 ENCOUNTER — Other Ambulatory Visit: Payer: Self-pay

## 2021-03-21 ENCOUNTER — Encounter: Payer: Self-pay | Admitting: Cardiology

## 2021-03-21 ENCOUNTER — Ambulatory Visit: Payer: 59 | Admitting: Cardiology

## 2021-03-21 ENCOUNTER — Other Ambulatory Visit (HOSPITAL_COMMUNITY): Payer: Self-pay

## 2021-03-21 VITALS — BP 110/76 | HR 94 | Ht 71.0 in | Wt 322.0 lb

## 2021-03-21 DIAGNOSIS — Z6841 Body Mass Index (BMI) 40.0 and over, adult: Secondary | ICD-10-CM | POA: Diagnosis not present

## 2021-03-21 DIAGNOSIS — I10 Essential (primary) hypertension: Secondary | ICD-10-CM | POA: Diagnosis not present

## 2021-03-21 DIAGNOSIS — I2510 Atherosclerotic heart disease of native coronary artery without angina pectoris: Secondary | ICD-10-CM

## 2021-03-21 DIAGNOSIS — R0600 Dyspnea, unspecified: Secondary | ICD-10-CM

## 2021-03-21 DIAGNOSIS — R931 Abnormal findings on diagnostic imaging of heart and coronary circulation: Secondary | ICD-10-CM | POA: Diagnosis not present

## 2021-03-21 DIAGNOSIS — R072 Precordial pain: Secondary | ICD-10-CM | POA: Diagnosis not present

## 2021-03-21 DIAGNOSIS — G4733 Obstructive sleep apnea (adult) (pediatric): Secondary | ICD-10-CM | POA: Diagnosis not present

## 2021-03-21 DIAGNOSIS — R0609 Other forms of dyspnea: Secondary | ICD-10-CM | POA: Insufficient documentation

## 2021-03-21 HISTORY — DX: Atherosclerotic heart disease of native coronary artery without angina pectoris: I25.10

## 2021-03-21 HISTORY — DX: Other forms of dyspnea: R06.09

## 2021-03-21 MED ORDER — METOPROLOL TARTRATE 100 MG PO TABS
100.0000 mg | ORAL_TABLET | Freq: Once | ORAL | 0 refills | Status: DC
  Filled 2021-03-21: qty 1, 1d supply, fill #0

## 2021-03-21 NOTE — Telephone Encounter (Signed)
Medication: Dexcom G6  Prior authorization submitted via CoverMyMeds [place] submission pending

## 2021-03-21 NOTE — Patient Instructions (Signed)
Medication Instructions:  No medication changes. *If you need a refill on your cardiac medications before your next appointment, please call your pharmacy*   Lab Work: None ordered If you have labs (blood work) drawn today and your tests are completely normal, you will receive your results only by: MyChart Message (if you have MyChart) OR A paper copy in the mail If you have any lab test that is abnormal or we need to change your treatment, we will call you to review the results.   Testing/Procedures: Your physician has requested that you have an echocardiogram. Echocardiography is a painless test that uses sound waves to create images of your heart. It provides your doctor with information about the size and shape of your heart and how well your heart's chambers and valves are working. This procedure takes approximately one hour. There are no restrictions for this procedure.   Your cardiac CT will be scheduled at:   [facility] [address] [place], [place] [address] [phone]   If scheduled at [facility], please arrive at the North Tower main entrance of [facility] 30 minutes prior to test start time. Proceed to the [facility] (first floor) to check-in and test prep.  Please follow these instructions carefully (unless otherwise directed):  On the Night Before the Test: Be sure to Drink plenty of water. Do not consume any caffeinated/decaffeinated beverages or chocolate 12 hours prior to your test. Do not take any antihistamines 12 hours prior to your test.   On the Day of the Test: Drink plenty of water. Do not drink any water within one hour of the test. Do not eat any food 4 hours prior to the test. You may take your regular medications prior to the test.  Take metoprolol (Lopressor) two hours prior to test. FEMALES- please wear underwire-free bra if available      After the Test: Drink plenty of water. After  receiving IV contrast, you may experience a mild flushed feeling. This is normal. On occasion, you may experience a mild rash up to 24 hours after the test. This is not dangerous. If this occurs, you can take Benadryl 25 mg and increase your fluid intake. If you experience trouble breathing, this can be serious. If it is severe call 911 IMMEDIATELY. If it is mild, please call our office. If you take any of these medications: Glipizide/Metformin, Avandament, Glucavance, please do not take 48 hours after completing test unless otherwise instructed.   Once we have confirmed authorization from your insurance company, we will call you to set up a date and time for your test. Based on how quickly your insurance processes prior authorizations requests, please allow up to 4 weeks to be contacted for scheduling your Cardiac CT appointment. Be advised that routine Cardiac CT appointments could be scheduled as many as 8 weeks after your provider has ordered it.  For non-scheduling related questions, please contact the cardiac imaging nurse navigator should you have any questions/concerns: Sara Wallace, Cardiac Imaging Nurse Navigator Merle Tai, Interim Cardiac Imaging Nurse Navigator  [facility] Direct Office Dial: [phone]   For scheduling needs, including cancellations and rescheduling, please call Toni at [phone].     Follow-Up: At [facility], you and your health needs are our priority.  As part of our continuing mission to provide you with exceptional heart care, we have created designated Provider Care Teams.  These Care Teams include your primary Cardiologist (physician)   and Advanced Practice Providers (APPs -  Physician Assistants and Nurse Practitioners) who all work together to provide you with the care you need, when you need it.  We recommend signing up for the patient portal called "MyChart".  Sign up information is provided on this After Visit Summary.   MyChart is used to connect with patients for Virtual Visits (Telemedicine).  Patients are able to view lab/test results, encounter notes, upcoming appointments, etc.  Non-urgent messages can be sent to your provider as well.   To learn more about what you can do with MyChart, go to [email or web address].    Your next appointment:   3 month(s)  The format for your next appointment:   In Person  Provider:   Jyl Heinz, MD   Other Instructions Cardiac CT Angiogram A cardiac CT angiogram is a procedure to look at the heart and the area around the heart. It may be done to help find the cause of chest pains or other symptoms of heart disease. During this procedure, a substance called contrast dye is injected into the blood vessels in the area to be checked. A large X-ray machine, called a CT scanner, then takes detailed pictures of the heart and the surrounding area. The procedure is also sometimes called a coronary CT angiogram, coronary artery scanning, or CTA. A cardiac CT angiogram allows the health care provider to see how well blood is flowing to and from the heart. The health care provider will be able to see if there are any problems, such as: Blockage or narrowing of the coronary arteries in the heart. Fluid around the heart. Signs of weakness or disease in the muscles, valves, and tissues of the heart. Tell a health care provider about: Any allergies you have. This is especially important if you have had a previous allergic reaction to contrast dye. All medicines you are taking, including vitamins, herbs, eye drops, creams, and over-the-counter medicines. Any blood disorders you have. Any surgeries you have had. Any medical conditions you have. Whether you are pregnant or may be pregnant. Any anxiety disorders, chronic pain, or other conditions you have that may increase your stress or prevent you from lying still. What are the risks? Generally, this is a safe procedure.  However, problems may occur, including: Bleeding. Infection. Allergic reactions to medicines or dyes. Damage to other structures or organs. Kidney damage from the contrast dye that is used. Increased risk of cancer from radiation exposure. This risk is low. Talk with your health care provider about: The risks and benefits of testing. How you can receive the lowest dose of radiation. What happens before the procedure? Wear comfortable clothing and remove any jewelry, glasses, dentures, and hearing aids. Follow instructions from your health care provider about eating and drinking. This may include: For 12 hours before the procedure -- avoid caffeine. This includes tea, coffee, soda, energy drinks, and diet pills. Drink plenty of water or other fluids that do not have caffeine in them. Being well hydrated can prevent complications. For 4-6 hours before the procedure -- stop eating and drinking. The contrast dye can cause nausea, but this is less likely if your stomach is empty. Ask your health care provider about changing or stopping your regular medicines. This is especially important if you are taking diabetes medicines, blood thinners, or medicines to treat problems with erections (erectile dysfunction). What happens during the procedure?  Hair on your chest may need to be removed so that small sticky patches called electrodes can  be placed on your chest. These will transmit information that helps to monitor your heart during the procedure. An IV will be inserted into one of your veins. You might be given a medicine to control your heart rate during the procedure. This will help to ensure that good images are obtained. You will be asked to lie on an exam table. This table will slide in and out of the CT machine during the procedure. Contrast dye will be injected into the IV. You might feel warm, or you may get a metallic taste in your mouth. You will be given a medicine called nitroglycerin.  This will relax or dilate the arteries in your heart. The table that you are lying on will move into the CT machine tunnel for the scan. The person running the machine will give you instructions while the scans are being done. You may be asked to: Keep your arms above your head. Hold your breath. Stay very still, even if the table is moving. When the scanning is complete, you will be moved out of the machine. The IV will be removed. The procedure may vary among health care providers and hospitals. What can I expect after the procedure? After your procedure, it is common to have: A metallic taste in your mouth from the contrast dye. A feeling of warmth. A headache from the nitroglycerin. Follow these instructions at home: Take over-the-counter and prescription medicines only as told by your health care provider. If you are told, drink enough fluid to keep your urine pale yellow. This will help to flush the contrast dye out of your body. Most people can return to their normal activities right after the procedure. Ask your health care provider what activities are safe for you. It is up to you to get the results of your procedure. Ask your health care provider, or the department that is doing the procedure, when your results will be ready. Keep all follow-up visits as told by your health care provider. This is important. Contact a health care provider if: You have any symptoms of allergy to the contrast dye. These include: Shortness of breath. Rash or hives. A racing heartbeat. Summary A cardiac CT angiogram is a procedure to look at the heart and the area around the heart. It may be done to help find the cause of chest pains or other symptoms of heart disease. During this procedure, a large X-ray machine, called a CT scanner, takes detailed pictures of the heart and the surrounding area after a contrast dye has been injected into blood vessels in the area. Ask your health care provider about  changing or stopping your regular medicines before the procedure. This is especially important if you are taking diabetes medicines, blood thinners, or medicines to treat erectile dysfunction. If you are told, drink enough fluid to keep your urine pale yellow. This will help to flush the contrast dye out of your body. This information is not intended to replace advice given to you by your health care provider. Make sure you discuss any questions you have with your health care provider. Document Revised: 03/17/2019 Document Reviewed: 03/17/2019 Elsevier Patient Education  2020 [employer]   

## 2021-03-21 NOTE — Progress Notes (Signed)
Cardiology Office Note:    Date:  03/21/2021   ID:  Tracy [name], DOB 08/05/1966, MRN [id number]  PCP:  Luetta Nutting, DO  Cardiologist:  Jenean Lindau, MD   Referring MD: Luetta Nutting, DO    ASSESSMENT:    1. Essential hypertension   2. Morbid obesity with BMI of 40.0-44.9, adult ([place])   3. Precordial pain   4. DOE (dyspnea on exertion)   5. Coronary artery disease involving native coronary artery of native heart without angina pectoris    PLAN:    In order of problems listed above:  Coronary artery disease: Dyspnea on exertion: This may be an anginal equivalent.  I reviewed CT scan report and it is concerning.  Therefore I will set her up for a CT coronary angiography with FFR.  She is agreeable.  This will help assess her symptoms. Essential hypertension: Blood pressure stable and diet was emphasized.  Lifestyle modification urged. Mixed dyslipidemia: On statin therapy and managed by primary care.  Diet emphasized. Morbid obesity: Weight reduction stressed risks of obesity explained and she promises to do better. Diabetes mellitus: Managed by primary care.  She knows to go to the nearest emergency room for chest pain or any such episodes.  Currently she denies any chest pain her issue is more of shortness of breath on exertion. Patient will be seen in follow-up appointment in 6 months or earlier if the patient has any concerns    Medication Adjustments/Labs and Tests Ordered: Current medicines are reviewed at length with the patient today.  Concerns regarding medicines are outlined above.  Orders Placed This Encounter  Procedures   CT CORONARY MORPH W/CTA COR W/SCORE W/CA W/CM &/OR WO/CM   ECHOCARDIOGRAM COMPLETE    Meds ordered this encounter  Medications   metoprolol tartrate (LOPRESSOR) 100 MG tablet    Sig: Take 1 tablet (100 mg total) by mouth once for 1 dose. Take 2 hours prior to your CT if your heart rate is greater than 55    Dispense:  1 tablet     Refill:  0      History of Present Illness:    Tracy [name] is a 55 y.o. female who is being seen today for the evaluation of dyspnea on exertion at the request of Luetta Nutting, DO.  Patient is a pleasant 55 year old female.  She has past medical history of essential hypertension, morbid obesity, ex-smoker, nonalcoholic steatohepatitis and coronary artery disease diagnosed by significant calcifications in coronary arteries.  She mentions to me that she has noted shortness of breath on exertion more than usual.  This is happening since the past 3 to 4 months and the patient mentions to me that that causes a change in her quality of life.  She will be concerned about it and wants to be evaluated.  At the time of my evaluation, the patient is alert awake oriented and in no distress.  She denies any chest pain.  Past Medical History:  Diagnosis Date   Anxiety    Arthritis    back, knees, right elbow   Back pain    Bilateral swelling of feet and ankles    Bipolar disorder (HCC)    Borderline personality disorder (HCC)    Cancer (HCC)    oral   Chewing difficulty    Cirrhosis ([place])    Constipation    Dental crowns present    Depression    Diabetes mellitus without complication ([place])    Fatty  liver    Food allergy    Walnuts   GAD (generalized anxiety disorder)    GERD (gastroesophageal reflux disease)    High cholesterol    no current med.   History of kidney stones    History of migraine headaches    Hypertension    under control with meds., has been on med. x 4 yr.   Hypothyroidism    IBS (irritable bowel syndrome)    Infertility, female    Joint pain    Lichen planus    Liver cirrhosis secondary to NASH (nonalcoholic steatohepatitis) (HCC)    NASH (nonalcoholic steatohepatitis)    Osteoarthritis    Other fatigue    Palpitations    PCOS (polycystic ovarian syndrome)    PONV (postoperative nausea and vomiting)    also hx. of emergence delirium 2007   Shortness of  breath on exertion    Sleep apnea    uses CPAP nightly   Trigeminal neuralgia    Type 2 diabetes mellitus ([place])    Vitamin D deficiency     Past Surgical History:  Procedure Laterality Date   ACHILLES TENDON SURGERY Left 2007   APPENDECTOMY     CARPAL TUNNEL RELEASE Right 01/12/2016   Procedure: RIGHT CARPAL TUNNEL RELEASE;  Surgeon: Roseanne Kaufman, MD;  Location: [facility];  Service: Orthopedics;  Laterality: Right;   COLONOSCOPY WITH PROPOFOL N/A 04/20/2014   Procedure: COLONOSCOPY WITH PROPOFOL;  Surgeon: Arta Silence, MD;  Location: [facility];  Service: Endoscopy;  Laterality: N/A;   COLONOSCOPY WITH PROPOFOL N/A 01/10/2021   Procedure: COLONOSCOPY WITH PROPOFOL;  Surgeon: Lavena Bullion, DO;  Location: [facility];  Service: Gastroenterology;  Laterality: N/A;   ESOPHAGOGASTRODUODENOSCOPY (EGD) WITH PROPOFOL N/A 04/20/2014   Procedure: ESOPHAGOGASTRODUODENOSCOPY (EGD) WITH PROPOFOL;  Surgeon: Arta Silence, MD;  [facility];  Service: Endoscopy;  Laterality: N/A;   ESOPHAGOGASTRODUODENOSCOPY (EGD) WITH PROPOFOL N/A 01/10/2021   Procedure: ESOPHAGOGASTRODUODENOSCOPY (EGD) WITH PROPOFOL;  Surgeon: Lavena Bullion, DO;  [facility];  Service: Gastroenterology;  Laterality: N/A;   EXCISION HAGLUND'S DEFORMITY WITH ACHILLES TENDON REPAIR Right 02/25/2013   Procedure: RIGHT ACHILLES DEBRIDEMENT AND RECONSTRUCTION;  HAGLUND'S EXCISION; GASTROC RECESSION AND FLEXOR HALLUCIS LONGUS TRANSFER;  Surgeon: Wylene Simmer, MD;  Location: [facility];  Service: Orthopedics;  Laterality: Right;   JOINT REPLACEMENT     hip   LUMBAR LAMINECTOMY     X 3   neck fusion     OVARIAN CYST SURGERY Left 1998   PILONIDAL CYST EXCISION  1994   RIGHT OOPHORECTOMY     STERIOD INJECTION Left 01/12/2016   Procedure: STEROID INJECTION LEFT WRIST;  Surgeon: Roseanne Kaufman, MD;  Location: [facility];  Service: Orthopedics;  Laterality: Left;    TOTAL HIP ARTHROPLASTY Left 03/19/2019   Procedure: LEFT TOTAL HIP ARTHROPLASTY ANTERIOR APPROACH;  Surgeon: Mcarthur Rossetti, MD;  Location: [facility];  Service: Orthopedics;  Laterality: Left;   TOTAL THYROIDECTOMY  2003   UPPER GI ENDOSCOPY  01/19/2015    Current Medications: Current Meds  Medication Sig   AMBULATORY NON FORMULARY MEDICATION Convertible/stand-up workstation such as "[employer]."   Blood Glucose Monitoring Suppl (FREESTYLE LITE) w/Device KIT USE TO CHECK BLOOD SUGAR ONCE A DAY   Blood Glucose Monitoring Suppl (FREESTYLE LITE) w/Device KIT USE TO CHECK BLOOD SUGAR ONCE A DAY   clonazePAM (KLONOPIN) 1 MG tablet Take 1 mg by mouth 3 (three) times daily.    Continuous Blood Gluc Receiver (  DEXCOM G6 RECEIVER) DEVI Use to monitor continuous blood glucose.   Continuous Blood Gluc Sensor (DEXCOM G6 SENSOR) MISC Replace every 10 days.  Use to check blood sugar.   Continuous Blood Gluc Transmit (DEXCOM G6 TRANSMITTER) MISC Use to check continuous blood sugar.  Replace every 90 days   cyclobenzaprine (FLEXERIL) 10 MG tablet TAKE 1 TABLET BY MOUTH 2 TIMES DAILY AS NEEDED FOR MUSCLE SPASMS (Patient taking differently: Take 10 mg by mouth 2 (two) times daily as needed for muscle spasms.)   famotidine (PEPCID) 40 MG tablet Take 1 tablet by mouth 2  times daily.   ferrous sulfate 325 (65 FE) MG EC tablet Take 1 tablet (325 mg total) by mouth in the morning and at bedtime. Take iron supplement 2 hours before or 4 hours after PPI or other antacids as this requires some level of gastric acidity to aid in absorption   folic acid (FOLVITE) 1 MG tablet Take 1 tablet (1 mg total) by mouth daily. (Patient taking differently: Take 1 mg by mouth in the morning.)   furosemide (LASIX) 40 MG tablet TAKE 1 TABLET BY MOUTH DAILY. (Patient taking differently: Take 40 mg by mouth in the morning.)   glucose blood (FREESTYLE LITE) test strip Use as instructed   glucose blood test strip USE TO CHECK BLOOD SUGAR  ONCE A DAY (Patient taking differently: USE TO CHECK BLOOD SUGAR ONCE A DAY)   hydrOXYzine (ATARAX/VISTARIL) 25 MG tablet Take 1 tablet (25 mg total) by mouth 3 (three) times daily as needed. (Patient taking differently: Take 25 mg by mouth at bedtime as needed (sleep).)   insulin degludec (TRESIBA FLEXTOUCH) 100 UNIT/ML FlexTouch Pen Inject 30 Units into the skin daily.   insulin lispro (HUMALOG KWIKPEN) 100 UNIT/ML KwikPen Inject up to 16 units 3 times daily at mealtime per sliding scale.   Insulin Pen Needle (PENTIPS) 31G X 5 MM MISC Use to inject insulin daily (Patient taking differently: Use to inject insulin daily)   Lancets (FREESTYLE) lancets USE TO TEST BLOOD SUGAR ONCE A DAY (Patient taking differently: USE TO TEST BLOOD SUGAR ONCE A DAY)   lidocaine (XYLOCAINE) 5 % ointment APPLY 1 G TOPICALLY 3 (THREE) TIMES DAILY BEFORE MEALS (DOESNT COME IN GEL, FAXED DR) (Patient taking differently: Apply 1 application topically 3 (three) times daily as needed (oral sores).)   lisinopril-hydrochlorothiazide (ZESTORETIC) 10-12.5 MG tablet TAKE 1 TABLET BY MOUTH ONCE DAILY (Patient taking differently: Take 1 tablet by mouth in the morning.)   meloxicam (MOBIC) 15 MG tablet One tab PO qAM with a meal for 2 weeks, then daily prn pain.   metoprolol tartrate (LOPRESSOR) 100 MG tablet Take 1 tablet (100 mg total) by mouth once for 1 dose. Take 2 hours prior to your CT if your heart rate is greater than 55   NUVIGIL 200 MG TABS Take 200 mg by mouth daily as needed (sleepiness).   nystatin (MYCOSTATIN/NYSTOP) powder Apply 3 times daily as directed. (Patient taking differently: Apply 1 application topically daily.)   ondansetron (ZOFRAN-ODT) 4 MG disintegrating tablet Take 4 mg by mouth 2 (two) times daily as needed for nausea or vomiting.   pimecrolimus (ELIDEL) 1 % cream APPLY TOPICALLY 2 TIMES DAILY TO COMPLETELY DRY AREA PLACE A THIN LAYER ONTO THE MOST SORE AREAS PRIOR TO BED. DO NOT RINSE (Patient taking  differently: Apply 1 application topically 2 (two) times daily as needed (mouth sores).)   RABEprazole (ACIPHEX) 20 MG tablet Take 1 tablet (20 mg total)  by mouth in the morning and at bedtime.   rizatriptan (MAXALT) 10 MG tablet Take 1 tablet (10 mg total) by mouth as needed for migraine. May repeat in 2 hours if needed (Patient taking differently: Take 10 mg by mouth every 2 (two) hours as needed for migraine. May repeat in 2 hours if needed)   rosuvastatin (CRESTOR) 20 MG tablet TAKE 1 TABLET BY MOUTH DAILY. (Patient taking differently: Take 20 mg by mouth in the morning.)   Semaglutide (RYBELSUS) 3 MG TABS Take 1 tablet (3 mg) by mouth daily.   sertraline (ZOLOFT) 100 MG tablet TAKE 2 TABLETS BY MOUTH EVERY MORNING   spironolactone (ALDACTONE) 50 MG tablet Take 1 tablet (50 mg total) by mouth 2 (two) times daily.   sucralfate (CARAFATE) 1 g tablet Take 1 tablet (1 g total) by mouth 4 (four) times daily -  with meals and at bedtime for 15 days.   SYNTHROID 300 MCG tablet TAKE 1 TABLET BY MOUTH ONCE A DAY BEFORE BREAKFAST (Patient taking differently: Take 300 mcg by mouth daily before breakfast.)   Vitamin D, Ergocalciferol, (DRISDOL) 1.25 MG (50000 UNIT) CAPS capsule TAKE 1 CAPSULE BY MOUTH EVERY 7 DAYS. 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   Drug use: No   Sexual activity: Not Currently    Partners: Male    Birth control/protection: Condom  Other Topics Concern   Not on file  Social History Narrative   RN - [employer]   Regular exercise: no   Caffeine use: 2 x daily   Right-handed.   Lives alone.   Social Determinants of Health   Financial Resource Strain: Not on file  Food Insecurity: Not on file  Transportation Needs: Not on file  Physical Activity: Not on file  Stress: Not on file  Social Connections: Not on file     Family History: The patient's family history includes Anxiety disorder in her brother and father; Bipolar disorder in her father; Breast cancer in her mother; Cancer in her brother and mother; Colon cancer in her brother; Depression in her brother, father, maternal grandmother, mother, and sister; Heart attack in her father; Heart disease in her father; Hypertension in her mother; Obesity in her father and mother; Sleep apnea in her mother; Stroke in her maternal grandfather; Sudden death in her father. There is no history of Pancreatic cancer, Stomach cancer, or Liver disease.  ROS:   Please see the history of present illness.    All  other systems reviewed and are negative.  EKGs/Labs/Other Studies Reviewed:    The following studies were reviewed today: IMPRESSION: 1. Poor quality evaluation for pulmonary embolism. No central embolism identified. Smaller emboli cannot be excluded. 2. Age advanced coronary artery atherosclerosis. Recommend assessment of coronary risk factors and consideration of medical therapy. 3. Cirrhosis and hepatic steatosis.  Borderline splenomegaly. 4. Right adrenal nodule is likely an adenoma and is similar to 2020.     Electronically Signed   By: Abigail Miyamoto M.D.   On: 02/10/2021 16:50   Recent Labs: 10/06/2020: Brain Natriuretic Peptide 5 12/01/2020: TSH 5.89 02/11/2021: ALT 35 02/14/2021: BUN 12; Creat 0.83; Hemoglobin 11.9; Platelets 171; Potassium 3.8; Sodium 139  Recent Lipid Panel    Component Value Date/Time   CHOL 155 10/11/2020 1041   TRIG 152 (H) 10/11/2020 1041   HDL 44 10/11/2020 1041   CHOLHDL 6.0 (H) 03/01/2020 1153   VLDL 29.6 03/25/2018 0730   LDLCALC 85 10/11/2020 1041   LDLCALC 169 (H) 03/01/2020 1153    Physical Exam:    VS:  BP 110/76 (BP Location: Left Arm, Patient Position: Sitting)   Pulse 94   Ht 5' 11" (1.803 m)   Wt (!) 322 lb 0.6 oz (146.1 kg)   LMP 08/24/2020   SpO2 96%   BMI 44.92 kg/m     Wt Readings from Last 3 Encounters:  03/21/21 (!) 322 lb 0.6 oz (146.1 kg)  03/14/21 (!) 335 lb (152 kg)  02/14/21 (!) 335 lb (152 kg)     GEN: Patient is in no acute distress HEENT: Normal NECK: No JVD; No carotid bruits LYMPHATICS: No lymphadenopathy CARDIAC: S1 S2 regular, 2/6 systolic murmur at the apex. RESPIRATORY:  Clear to auscultation without rales, wheezing or rhonchi  ABDOMEN: Soft, non-tender, non-distended MUSCULOSKELETAL:  No edema; No deformity  SKIN: Warm and dry NEUROLOGIC:  Alert and oriented x 3 PSYCHIATRIC:  Normal affect    Signed, Jenean Lindau, MD  03/21/2021 4:51 PM    [facility]

## 2021-03-22 ENCOUNTER — Other Ambulatory Visit (HOSPITAL_COMMUNITY): Payer: Self-pay

## 2021-03-22 ENCOUNTER — Ambulatory Visit (INDEPENDENT_AMBULATORY_CARE_PROVIDER_SITE_OTHER): Payer: 59 | Admitting: Family Medicine

## 2021-03-22 ENCOUNTER — Encounter (INDEPENDENT_AMBULATORY_CARE_PROVIDER_SITE_OTHER): Payer: Self-pay | Admitting: Family Medicine

## 2021-03-22 VITALS — BP 108/72 | HR 89 | Temp 98.1°F | Ht 70.0 in | Wt 317.0 lb

## 2021-03-22 DIAGNOSIS — F32A Depression, unspecified: Secondary | ICD-10-CM

## 2021-03-22 DIAGNOSIS — F419 Anxiety disorder, unspecified: Secondary | ICD-10-CM | POA: Diagnosis not present

## 2021-03-22 DIAGNOSIS — E559 Vitamin D deficiency, unspecified: Secondary | ICD-10-CM | POA: Diagnosis not present

## 2021-03-22 DIAGNOSIS — Z6841 Body Mass Index (BMI) 40.0 and over, adult: Secondary | ICD-10-CM

## 2021-03-22 DIAGNOSIS — E1165 Type 2 diabetes mellitus with hyperglycemia: Secondary | ICD-10-CM | POA: Diagnosis not present

## 2021-03-22 NOTE — Telephone Encounter (Signed)
Medication: Dexcom G6 (transmitter, sensor, receiver) Prior authorization determination received Medication has been approved  Transmitter: approved for a maximum of 4 refills Sensor: approved for a maximum of 12 refills with a quantity limit of 3 sensors per 30 days Receiver: approved for a maximum of 1 refill with a quantity limit of 1 receiver per year  Patient aware: Yes, via MyChart message Pharmacy aware: Yes Provider aware via this encounter

## 2021-03-23 LAB — BASIC METABOLIC PANEL
BUN: 11 mg/dL (ref 7–25)
CO2: 23 mmol/L (ref 20–32)
Calcium: 9.7 mg/dL (ref 8.6–10.4)
Chloride: 98 mmol/L (ref 98–110)
Creat: 0.85 mg/dL (ref 0.50–1.03)
Glucose, Bld: 342 mg/dL — ABNORMAL HIGH (ref 65–99)
Potassium: 4.3 mmol/L (ref 3.5–5.3)
Sodium: 133 mmol/L — ABNORMAL LOW (ref 135–146)

## 2021-03-26 NOTE — Progress Notes (Signed)
Chief Complaint:   OBESITY Marquita is here to discuss her progress with her obesity treatment plan along with follow-up of her obesity related diagnoses. Wladyslawa is on the Category 3 Plan and states she is following her eating plan approximately 0% of the time. Carlin states she is doing 0 minutes 0 times per week.  Today's visit was #: 4 Starting weight: 336 lbs Starting date: 10/11/2020 Today's weight: 317 lbs Today's date: 03/22/2021 Total lbs lost to date: 19 Total lbs lost since last in-office visit: 14  Interim History: Renisha's last visit was approximately 4 months ago. She continues to work on weight loss and she is considering having weight loss surgery. She has a history of cirrhosis which may limit her options. She has a surgical appointment in 4 weeks.  Subjective:   1. Vitamin D deficiency Cailynn is stable on Vit D, and she denies signs of over-replacement.  2. Anxiety and depression Mirian is following up with a Production manager. She is going through a divorce and she is struggling. She is tearful in our office today, but appropriately so. She is skipping meals due to stress induced nausea and she notes emotional eating behaviors.   Assessment/Plan:   1. Vitamin D deficiency Low Vitamin D level contributes to fatigue and are associated with obesity, breast, and colon cancer. Amilyah agreed to continue prescription Vitamin D 50,000 IU every week and will follow-up for routine testing of Vitamin D, at least 2-3 times per year to avoid over-replacement.  2. Anxiety and depression Behavior modification techniques were discussed today to help Zoeey deal with her depression and anxiety. Althea is to continue to work with her doctors and I offered support and encouragement. She will try to avoid skipping meals and work on some positive self-talk. Orders and follow up as documented in patient record.   3. Obesity with current BMI 45.6 Meeah is currently in the action  stage of change. As such, her goal is to continue with weight loss efforts. She has agreed to the Category 3 Plan.   Behavioral modification strategies: increasing lean protein intake and no skipping meals.  Anastasia has agreed to follow-up with our clinic in 6 weeks. She was informed of the importance of frequent follow-up visits to maximize her success with intensive lifestyle modifications for her multiple health conditions.   Objective:   Blood pressure 108/72, pulse 89, temperature 98.1 F (36.7 C), height '5\' 10"'$  (1.778 m), weight (!) 317 lb (143.8 kg), last menstrual period 08/24/2020, SpO2 96 %. Body mass index is 45.48 kg/m.  General: Cooperative, alert, well developed, in no acute distress. HEENT: Conjunctivae and lids unremarkable. Cardiovascular: Regular rhythm.  Lungs: Normal work of breathing. Neurologic: No focal deficits.   Lab Results  Component Value Date   CREATININE 0.85 03/22/2021   BUN 11 03/22/2021   NA 133 (L) 03/22/2021   K 4.3 03/22/2021   CL 98 03/22/2021   CO2 23 03/22/2021   Lab Results  Component Value Date   ALT 35 02/11/2021   AST 38 02/11/2021   ALKPHOS 172 (H) 02/11/2021   BILITOT 0.1 (L) 02/11/2021   Lab Results  Component Value Date   HGBA1C 8.3 (H) 12/26/2020   HGBA1C 8.0 (H) 09/21/2020   HGBA1C 5.8 (H) 03/01/2020   HGBA1C 4.9 03/16/2019   HGBA1C 5.1 03/04/2019   Lab Results  Component Value Date   INSULIN 77.2 (H) 10/11/2020   Lab Results  Component Value Date   TSH  5.89 (H) 12/01/2020   Lab Results  Component Value Date   CHOL 155 10/11/2020   HDL 44 10/11/2020   LDLCALC 85 10/11/2020   TRIG 152 (H) 10/11/2020   CHOLHDL 6.0 (H) 03/01/2020   Lab Results  Component Value Date   VD25OH 14.0 (L) 10/11/2020   VD25OH 36 06/25/2018   VD25OH 22.91 (L) 03/25/2018   Lab Results  Component Value Date   WBC 4.4 02/14/2021   HGB 11.9 02/14/2021   HCT 38.4 02/14/2021   MCV 82.2 02/14/2021   PLT 171 02/14/2021   Lab Results   Component Value Date   IRON 33 (L) 12/28/2020   TIBC 269 12/28/2020   FERRITIN 10.6 12/28/2020   Attestation Statements:   Reviewed by clinician on day of visit: allergies, medications, problem list, medical history, surgical history, family history, social history, and previous encounter notes.  Time spent on visit including pre-visit chart review and post-visit care and charting was 35 minutes.    I, Trixie Dredge, am acting as transcriptionist for Dennard Nip, MD.  I have reviewed the above documentation for accuracy and completeness, and I agree with the above. -  Dennard Nip, MD

## 2021-03-27 ENCOUNTER — Telehealth (HOSPITAL_COMMUNITY): Payer: Self-pay | Admitting: *Deleted

## 2021-03-27 NOTE — Telephone Encounter (Signed)
Reaching out to patient to offer assistance regarding upcoming cardiac imaging study; pt verbalizes understanding of appt date/time, parking situation and where to check in, pre-test NPO status and medications ordered, and verified current allergies; name and call back number provided for further questions should they arise ° °Yomara Toothman RN Navigator Cardiac Imaging °[facility] °[phone] office °[phone] cell  ° °Patient to take 100mg metoprolol tartrate two hours prior to cardiac CT scan. °

## 2021-03-28 ENCOUNTER — Other Ambulatory Visit: Payer: Self-pay

## 2021-03-28 ENCOUNTER — Ambulatory Visit (HOSPITAL_BASED_OUTPATIENT_CLINIC_OR_DEPARTMENT_OTHER): Payer: 59

## 2021-03-28 DIAGNOSIS — R072 Precordial pain: Secondary | ICD-10-CM | POA: Diagnosis not present

## 2021-03-28 DIAGNOSIS — F3181 Bipolar II disorder: Secondary | ICD-10-CM | POA: Diagnosis not present

## 2021-03-28 LAB — ECHOCARDIOGRAM COMPLETE
Area-P 1/2: 3.58 cm2
S' Lateral: 3 cm

## 2021-03-28 NOTE — Progress Notes (Signed)
2D Echocardiogram has been completed. [place]

## 2021-03-29 ENCOUNTER — Encounter (HOSPITAL_COMMUNITY): Payer: Self-pay

## 2021-03-29 ENCOUNTER — Ambulatory Visit (HOSPITAL_COMMUNITY)
Admission: RE | Admit: 2021-03-29 | Discharge: 2021-03-29 | Disposition: A | Discharge status: Home / Self Care | Payer: 59 | Source: Ambulatory Visit | Attending: Cardiology | Admitting: Cardiology

## 2021-03-29 DIAGNOSIS — R072 Precordial pain: Secondary | ICD-10-CM | POA: Insufficient documentation

## 2021-03-29 MED ORDER — NITROGLYCERIN 0.4 MG SL SUBL
0.8000 mg | SUBLINGUAL_TABLET | SUBLINGUAL | Status: DC | PRN
  Administered 2021-03-29: 0.8 mg via SUBLINGUAL

## 2021-03-29 MED ORDER — IOHEXOL 350 MG/ML SOLN
95.0000 mL | Freq: Once | INTRAVENOUS | Status: AC | PRN
  Administered 2021-03-29: 95 mL via INTRAVENOUS

## 2021-03-29 MED ORDER — NITROGLYCERIN 0.4 MG SL SUBL
SUBLINGUAL_TABLET | SUBLINGUAL | Status: AC
  Filled 2021-03-29: qty 1

## 2021-03-29 MED ORDER — METOPROLOL TARTRATE 5 MG/5ML IV SOLN: 5.0000 mg | INTRAVENOUS | Status: DC | PRN

## 2021-03-29 MED ORDER — METOPROLOL TARTRATE 5 MG/5ML IV SOLN
INTRAVENOUS | Status: AC
  Administered 2021-03-29: 5 mg via INTRAVENOUS
  Filled 2021-03-29: qty 5

## 2021-03-29 MED ORDER — SODIUM CHLORIDE 0.9 % IV BOLUS
250.0000 mL | Freq: Once | INTRAVENOUS | Status: AC
  Administered 2021-03-29: 250 mL via INTRAVENOUS

## 2021-03-29 NOTE — Addendum Note (Signed)
Addended by: Truddie Hidden on: 03/29/2021 06:27 PM   Modules accepted: Orders

## 2021-04-09 ENCOUNTER — Other Ambulatory Visit: Payer: Self-pay | Admitting: Sports Medicine

## 2021-04-09 ENCOUNTER — Other Ambulatory Visit (HOSPITAL_COMMUNITY): Payer: Self-pay

## 2021-04-09 ENCOUNTER — Other Ambulatory Visit: Payer: Self-pay | Admitting: Gastroenterology

## 2021-04-10 ENCOUNTER — Other Ambulatory Visit (HOSPITAL_COMMUNITY): Payer: Self-pay

## 2021-04-10 MED ORDER — FAMOTIDINE 40 MG PO TABS
40.0000 mg | ORAL_TABLET | Freq: Two times a day (BID) | ORAL | 2 refills | Status: DC
  Filled 2021-04-10: qty 60, 30d supply, fill #0
  Filled 2021-05-21: qty 120, 60d supply, fill #1

## 2021-04-10 MED FILL — Semaglutide Tab 3 MG: ORAL | 30 days supply | Qty: 30 | Fill #0 | Status: AC

## 2021-04-11 ENCOUNTER — Telehealth: Payer: Self-pay | Admitting: Adult Health

## 2021-04-11 ENCOUNTER — Other Ambulatory Visit (HOSPITAL_COMMUNITY): Payer: Self-pay

## 2021-04-11 NOTE — Telephone Encounter (Signed)
Please let me know what to tell her

## 2021-04-11 NOTE — Telephone Encounter (Signed)
Have we gotten any new information?

## 2021-04-11 NOTE — Telephone Encounter (Signed)
Tracy [name] called wanting to check on the status of starting spravato.  Discussed it in July and it is now Sept.  Please call with update on start date.

## 2021-04-12 DIAGNOSIS — F3181 Bipolar II disorder: Secondary | ICD-10-CM | POA: Diagnosis not present

## 2021-04-12 NOTE — Telephone Encounter (Signed)
I will have to check with Dr. Clovis Pu, not sure where we stand at this time due to some setbacks.

## 2021-04-13 ENCOUNTER — Other Ambulatory Visit (HOSPITAL_COMMUNITY): Payer: Self-pay

## 2021-04-13 ENCOUNTER — Other Ambulatory Visit: Payer: Self-pay | Admitting: Sports Medicine

## 2021-04-16 ENCOUNTER — Other Ambulatory Visit (HOSPITAL_COMMUNITY): Payer: Self-pay

## 2021-04-17 ENCOUNTER — Other Ambulatory Visit (HOSPITAL_COMMUNITY): Payer: Self-pay

## 2021-04-17 NOTE — Telephone Encounter (Signed)
Noted  

## 2021-04-17 NOTE — Telephone Encounter (Signed)
LM for her to call nurse back. Will update her where we are at with Spravato. I also am still holding her FMLA she wants to use for Spravato treatments.

## 2021-04-21 ENCOUNTER — Other Ambulatory Visit (HOSPITAL_COMMUNITY): Payer: Self-pay

## 2021-04-21 ENCOUNTER — Other Ambulatory Visit: Payer: Self-pay | Admitting: Sports Medicine

## 2021-04-23 ENCOUNTER — Other Ambulatory Visit (HOSPITAL_COMMUNITY): Payer: Self-pay

## 2021-04-25 ENCOUNTER — Other Ambulatory Visit (HOSPITAL_COMMUNITY): Payer: Self-pay

## 2021-04-25 ENCOUNTER — Ambulatory Visit: Payer: 59 | Admitting: Cardiology

## 2021-04-25 ENCOUNTER — Encounter: Payer: Self-pay | Admitting: Family Medicine

## 2021-04-25 ENCOUNTER — Other Ambulatory Visit: Payer: Self-pay

## 2021-04-25 ENCOUNTER — Ambulatory Visit: Payer: 59 | Admitting: Family Medicine

## 2021-04-25 VITALS — BP 95/64 | HR 87 | Wt 312.0 lb

## 2021-04-25 DIAGNOSIS — I10 Essential (primary) hypertension: Secondary | ICD-10-CM | POA: Diagnosis not present

## 2021-04-25 DIAGNOSIS — E119 Type 2 diabetes mellitus without complications: Secondary | ICD-10-CM

## 2021-04-25 DIAGNOSIS — Z23 Encounter for immunization: Secondary | ICD-10-CM | POA: Diagnosis not present

## 2021-04-25 DIAGNOSIS — E890 Postprocedural hypothyroidism: Secondary | ICD-10-CM

## 2021-04-25 LAB — POCT GLYCOSYLATED HEMOGLOBIN (HGB A1C): HbA1c, POC (controlled diabetic range): 11.1 % — AB (ref 0.0–7.0)

## 2021-04-25 MED ORDER — TRESIBA FLEXTOUCH 100 UNIT/ML ~~LOC~~ SOPN
30.0000 [IU] | PEN_INJECTOR | Freq: Two times a day (BID) | SUBCUTANEOUS | 3 refills | Status: DC
Start: 2021-04-25 — End: 2022-01-01
  Filled 2021-04-25 – 2021-07-11 (×2): qty 54, 90d supply, fill #0

## 2021-04-25 MED ORDER — ROSUVASTATIN CALCIUM 20 MG PO TABS
ORAL_TABLET | Freq: Every day | ORAL | 3 refills | Status: DC
Start: 2021-04-25 — End: 2022-04-25
  Filled 2021-04-25: qty 90, 90d supply, fill #0
  Filled 2021-07-09: qty 90, 90d supply, fill #1
  Filled 2021-10-31: qty 90, 90d supply, fill #2
  Filled 2022-01-25: qty 90, 90d supply, fill #3

## 2021-04-25 MED ORDER — RYBELSUS 7 MG PO TABS
7.0000 mg | ORAL_TABLET | Freq: Every day | ORAL | 3 refills | Status: DC
  Filled 2021-04-25: qty 30, 30d supply, fill #0
  Filled 2021-05-17: qty 30, 30d supply, fill #1

## 2021-04-25 MED ORDER — LISINOPRIL 10 MG PO TABS
10.0000 mg | ORAL_TABLET | Freq: Every day | ORAL | 3 refills | Status: DC
  Filled 2021-04-25: qty 90, 90d supply, fill #0
  Filled 2021-07-30: qty 90, 90d supply, fill #1
  Filled 2021-08-27 – 2021-10-15 (×2): qty 90, 90d supply, fill #2
  Filled 2022-01-25: qty 90, 90d supply, fill #3

## 2021-04-25 NOTE — Assessment & Plan Note (Signed)
Blood pressure is on the low side at this time.  We will discontinue hydrochlorothiazide portion of lisinopril with hydrochlorothiazide.  She will continue lisinopril at current strength for kidney protection is related to her diabetes.

## 2021-04-25 NOTE — Assessment & Plan Note (Signed)
Her diabetes remains poorly controlled.  Increasing Rybelsus to 7 mg daily.  Continue to titrate [place], will have her split dosing to twice daily

## 2021-04-25 NOTE — Patient Instructions (Addendum)
Great to see you! Split your Tresiba dose.  Increase rybelsus to 7mg  daily.  Stop Lisinopril/hctz and start lisinopril only See me again in 3 months.

## 2021-04-25 NOTE — Progress Notes (Signed)
Tracy [name] - 55 y.o. female MRN [id number]  Date of birth: 07/08/1966  Subjective Chief Complaint  Patient presents with   Diabetes    HPI Tracy [name] is a 55 year old female here today for follow-up visit.  She continues to have elevated blood sugars.  She has continue to titrate [place], currently at 52 units daily.  She is planning on increasing to 54 units tomorrow.  She is tolerating Rybelsus well at this time.  She has not had GI upset with this.  She continues to struggle with managing her diet.  Her blood pressure is a little low today.  She is taking lisinopril with hydrochlorothiazide and spironolactone.  She has felt a little more sluggish.  She denies dizziness or lightheadedness.  She denies chest pain, shortness of breath, palpitations  ROS:  A comprehensive ROS was completed and negative except as noted per HPI  Allergies  Allergen Reactions   Sulfa Antibiotics Itching, Swelling and Rash   Food     Walnut-mouth swelling/itching   Bupropion Hcl Itching and Rash   Codeine Itching   Sulfamethoxazole-Trimethoprim Rash    Past Medical History:  Diagnosis Date   Anxiety    Arthritis    back, knees, right elbow   Back pain    Bilateral swelling of feet and ankles    Bipolar disorder (HCC)    Borderline personality disorder ([place])    Cancer (HCC)    oral   Chewing difficulty    Cirrhosis (HCC)    Constipation    Dental crowns present    Depression    Diabetes mellitus without complication ([place])    Fatty liver    Food allergy    Walnuts   GAD (generalized anxiety disorder)    GERD (gastroesophageal reflux disease)    High cholesterol    no current med.   History of kidney stones    History of migraine headaches    Hypertension    under control with meds., has been on med. x 4 yr.   Hypothyroidism    IBS (irritable bowel syndrome)    Infertility, female    Joint pain    Lichen planus    Liver cirrhosis secondary to NASH (nonalcoholic steatohepatitis)  (HCC)    NASH (nonalcoholic steatohepatitis)    Osteoarthritis    Other fatigue    Palpitations    PCOS (polycystic ovarian syndrome)    PONV (postoperative nausea and vomiting)    also hx. of emergence delirium 2007   Shortness of breath on exertion    Sleep apnea    uses CPAP nightly   Trigeminal neuralgia    Type 2 diabetes mellitus ([place])    Vitamin D deficiency     Past Surgical History:  Procedure Laterality Date   ACHILLES TENDON SURGERY Left 2007   APPENDECTOMY     CARPAL TUNNEL RELEASE Right 01/12/2016   Procedure: RIGHT CARPAL TUNNEL RELEASE;  Surgeon: Roseanne Kaufman, MD;  Location: [facility];  Service: Orthopedics;  Laterality: Right;   COLONOSCOPY WITH PROPOFOL N/A 04/20/2014   Procedure: COLONOSCOPY WITH PROPOFOL;  Surgeon: Arta Silence, MD;  Location: [facility];  Service: Endoscopy;  Laterality: N/A;   COLONOSCOPY WITH PROPOFOL N/A 01/10/2021   Procedure: COLONOSCOPY WITH PROPOFOL;  Surgeon: Lavena Bullion, DO;  Location: [facility];  Service: Gastroenterology;  Laterality: N/A;   ESOPHAGOGASTRODUODENOSCOPY (EGD) WITH PROPOFOL N/A 04/20/2014   Procedure: ESOPHAGOGASTRODUODENOSCOPY (EGD) WITH PROPOFOL;  Surgeon: Arta Silence, MD;  [facility]: [facility];  Service:  Endoscopy;  Laterality: N/A;   ESOPHAGOGASTRODUODENOSCOPY (EGD) WITH PROPOFOL N/A 01/10/2021   Procedure: ESOPHAGOGASTRODUODENOSCOPY (EGD) WITH PROPOFOL;  Surgeon: Lavena Bullion, DO;  [facility];  Service: Gastroenterology;  Laterality: N/A;   EXCISION HAGLUND'S DEFORMITY WITH ACHILLES TENDON REPAIR Right 02/25/2013   Procedure: RIGHT ACHILLES DEBRIDEMENT AND RECONSTRUCTION;  HAGLUND'S EXCISION; GASTROC RECESSION AND FLEXOR HALLUCIS LONGUS TRANSFER;  Surgeon: Wylene Simmer, MD;  Location: [facility];  Service: Orthopedics;  Laterality: Right;   JOINT REPLACEMENT     hip   LUMBAR LAMINECTOMY     X 3   neck fusion     OVARIAN CYST SURGERY Left 1998   PILONIDAL  CYST EXCISION  1994   RIGHT OOPHORECTOMY     STERIOD INJECTION Left 01/12/2016   Procedure: STEROID INJECTION LEFT WRIST;  Surgeon: Roseanne Kaufman, MD;  Location: [facility];  Service: Orthopedics;  Laterality: Left;   TOTAL HIP ARTHROPLASTY Left 03/19/2019   Procedure: LEFT TOTAL HIP ARTHROPLASTY ANTERIOR APPROACH;  Surgeon: Mcarthur Rossetti, MD;  Location: [facility];  Service: Orthopedics;  Laterality: Left;   TOTAL THYROIDECTOMY  2003   UPPER GI ENDOSCOPY  01/19/2015    Social History   Socioeconomic History   Marital status: Divorced    Spouse name: Not on file   Number of children: 0   Years of education: 16   Highest education level: Not on file  Occupational History   Occupation: RN-Telephone Copy    Employer: [employer]    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   Drug use: No   Sexual activity: Not Currently    Partners: Male    Birth control/protection: Condom  Other Topics Concern   Not on file  Social History Narrative   RN - [place]   Regular exercise: no   Caffeine use: 2 x daily   Right-handed.   Lives alone.   Social Determinants of Health   Financial Resource Strain: Not on file  Food Insecurity: Not on file  Transportation Needs: Not on file  Physical Activity: Not on file  Stress: Not on file  Social Connections: Not on file    Family History  Problem Relation Age of Onset   Heart disease Father    Depression Father    Heart attack Father    Sudden death Father    Anxiety disorder Father    Bipolar  disorder Father    Obesity Father    Cancer Brother        THROAT cancer   Depression Brother    Anxiety disorder Brother    Depression Sister    Breast cancer Mother    Hypertension Mother    Cancer Mother    Depression Mother    Sleep apnea Mother    Obesity Mother    Depression Maternal Grandmother    Stroke Maternal Grandfather    Colon cancer Brother    Pancreatic cancer Neg Hx  Stomach cancer Neg Hx    Liver disease Neg Hx     Health Maintenance  Topic Date Due   HIV Screening  Never done   Hepatitis C Screening  Never done   PAP SMEAR-Modifier  Never done   MAMMOGRAM  10/28/2017   Zoster Vaccines- Shingrix (1 of 2) Never done   OPHTHALMOLOGY EXAM  03/28/2020   HEMOGLOBIN A1C  10/23/2021   FOOT EXAM  03/14/2022   TETANUS/TDAP  01/03/2028   COLONOSCOPY (Pts 45-4yrs Insurance coverage will need to be confirmed)  01/11/2031   INFLUENZA VACCINE  Completed   COVID-19 Vaccine  Completed   HPV VACCINES  Aged Out     ----------------------------------------------------------------------------------------------------------------------------------------------------------------------------------------------------------------- Physical Exam BP 95/64   Pulse 87   Wt (!) 312 lb (141.5 kg)   LMP 08/24/2020   SpO2 96% Comment: on RA  BMI 44.77 kg/m   Physical Exam Constitutional:      Appearance: Normal appearance.  Eyes:     General: No scleral icterus. Cardiovascular:     Rate and Rhythm: Normal rate and regular rhythm.  Pulmonary:     Effort: Pulmonary effort is normal.     Breath sounds: Normal breath sounds.  Musculoskeletal:     Cervical back: Neck supple.  Neurological:     General: No focal deficit present.     Mental Status: She is alert.  Psychiatric:        Mood and Affect: Mood normal.        Behavior: Behavior normal.     ------------------------------------------------------------------------------------------------------------------------------------------------------------------------------------------------------------------- Assessment and Plan  Essential hypertension Blood pressure is on the low side at this time.  We will discontinue hydrochlorothiazide portion of lisinopril with hydrochlorothiazide.  She will continue lisinopril at current strength for kidney protection is related to her diabetes.  Diabetes mellitus, type 2 Her diabetes remains poorly controlled.  Increasing Rybelsus to 7 mg daily.  Continue to titrate [place], will have her split dosing to twice daily  HYPOTHYROIDISM, POSTSURGICAL This has been stable with current dose of levothyroxine.   Meds ordered this encounter  Medications   rosuvastatin (CRESTOR) 20 MG tablet    Sig: TAKE 1 TABLET BY MOUTH DAILY.    Dispense:  90 tablet    Refill:  3   insulin degludec (TRESIBA FLEXTOUCH) 100 UNIT/ML FlexTouch Pen    Sig: Inject 30 Units into the skin 2 (two) times daily.    Dispense:  54 mL    Refill:  3   Semaglutide (RYBELSUS) 7 MG TABS    Sig: Take 1 tablet (7mg ) by mouth daily.    Dispense:  30 tablet    Refill:  3   lisinopril (ZESTRIL) 10 MG tablet    Sig: Take 1 tablet (10 mg total) by mouth daily.    Dispense:  90 tablet    Refill:  3    Return in about 3 months (around 07/25/2021) for T2DM.    This visit occurred during the SARS-CoV-2 public health emergency.  Safety protocols were in place, including screening questions prior to the visit, additional usage of staff PPE, and extensive cleaning of exam room while observing appropriate contact time as indicated for disinfecting solutions.

## 2021-04-25 NOTE — Assessment & Plan Note (Signed)
This has been stable with current dose of levothyroxine.

## 2021-05-02 ENCOUNTER — Other Ambulatory Visit (HOSPITAL_COMMUNITY): Payer: Self-pay

## 2021-05-02 ENCOUNTER — Encounter: Payer: Self-pay | Admitting: Family Medicine

## 2021-05-02 DIAGNOSIS — M1712 Unilateral primary osteoarthritis, left knee: Secondary | ICD-10-CM

## 2021-05-02 MED ORDER — MELOXICAM 15 MG PO TABS
ORAL_TABLET | ORAL | 0 refills | Status: DC
  Filled 2021-05-02: qty 30, 30d supply, fill #0

## 2021-05-03 ENCOUNTER — Encounter (INDEPENDENT_AMBULATORY_CARE_PROVIDER_SITE_OTHER): Payer: Self-pay | Admitting: Family Medicine

## 2021-05-03 ENCOUNTER — Ambulatory Visit (INDEPENDENT_AMBULATORY_CARE_PROVIDER_SITE_OTHER): Payer: 59 | Admitting: Family Medicine

## 2021-05-03 ENCOUNTER — Other Ambulatory Visit (HOSPITAL_COMMUNITY): Payer: Self-pay

## 2021-05-03 ENCOUNTER — Other Ambulatory Visit: Payer: Self-pay

## 2021-05-03 VITALS — BP 98/63 | HR 75 | Temp 97.9°F | Ht 70.0 in | Wt 310.0 lb

## 2021-05-03 DIAGNOSIS — E538 Deficiency of other specified B group vitamins: Secondary | ICD-10-CM | POA: Diagnosis not present

## 2021-05-03 DIAGNOSIS — Z9189 Other specified personal risk factors, not elsewhere classified: Secondary | ICD-10-CM

## 2021-05-03 DIAGNOSIS — Z6841 Body Mass Index (BMI) 40.0 and over, adult: Secondary | ICD-10-CM

## 2021-05-03 DIAGNOSIS — E1169 Type 2 diabetes mellitus with other specified complication: Secondary | ICD-10-CM

## 2021-05-03 DIAGNOSIS — E66813 Obesity, class 3: Secondary | ICD-10-CM

## 2021-05-03 DIAGNOSIS — I959 Hypotension, unspecified: Secondary | ICD-10-CM | POA: Diagnosis not present

## 2021-05-03 MED ORDER — FOLIC ACID 1 MG PO TABS
1.0000 mg | ORAL_TABLET | Freq: Every day | ORAL | 0 refills | Status: DC
  Filled 2021-05-03: qty 30, 30d supply, fill #0

## 2021-05-03 NOTE — Progress Notes (Signed)
Chief Complaint:   OBESITY Tracy [name] is here to discuss her progress with her obesity treatment plan along with follow-up of her obesity related diagnoses. Tracy [name] is on the Category 3 Plan and states she is following her eating plan approximately 75% of the time. Tracy [name] states she is doing 0 minutes 0 times per week.  Today's visit was #: 5 Starting weight: 336 lbs Starting date: 10/11/2020 Today's weight: 310 lbs Today's date: 05/03/2021 Total lbs lost to date: 26 Total lbs lost since last in-office visit: 7  Interim History: Tracy [name] continues to do well with weight loss on her Category 3 plan. She has had some deviations but she remains mindful and is working on eating healthier overall.  Subjective:   1. Hypotension, unspecified hypotension type Tracy [name] discontinued her HCTZ but her blood pressure is still low, and she notes ongoing fatigue. She is being worked up for palpitations by Cardiology.  2. Type 2 diabetes mellitus with other specified complication, without long-term current use of insulin (Tracy [name]) Tracy [name]'s fasting Bgs mostly range between 100-150's. She is now on Rybelsus and she notes decreased polyphagia.  3. Folic acid deficiency Tracy [name] is stable on Folic acid, and she is working on increasing vegetables in her diet.  4. At risk for heart disease Tracy [name] is at a higher than average risk for cardiovascular disease due to obesity.   Assessment/Plan:   1. Hypotension, unspecified hypotension type Tracy [name] agreed to decrease lisinopril to 1/2 pill (5 mg) per day, and will continue to monitor. She will follow up with Cardiology for results as available.  2. Type 2 diabetes mellitus with other specified complication, without long-term current use of insulin Tracy [name]) Tracy [name] will continue with diet, exercise, and her medications and we will follow up at her next visit. Good blood sugar control is important to decrease the likelihood of diabetic complications such as nephropathy, neuropathy,  limb loss, blindness, coronary artery disease, and death. Intensive lifestyle modification including diet, exercise and weight loss are the first line of treatment for diabetes.   3. Folic acid deficiency Tracy [name] will continue her folic acid, and we will refill folic acid 1 mg q daily for 1 month.  4. At risk for heart disease Tracy [name] was given approximately 15 minutes of coronary artery disease prevention counseling today. She is 55 y.o. female and has risk factors for heart disease including obesity. We discussed intensive lifestyle modifications today with an emphasis on specific weight loss instructions and strategies.   Repetitive spaced learning was employed today to elicit superior memory formation and behavioral change.  5. Obesity with current BMI of 44.5 Tracy [name] is currently in the action stage of change. As such, her goal is to continue with weight loss efforts. She has agreed to the Category 3 Plan.   Behavioral modification strategies: meal planning and cooking strategies.  Tracy [name] has agreed to follow-up with our clinic in 2 weeks. She was informed of the importance of frequent follow-up visits to maximize her success with intensive lifestyle modifications for her multiple health conditions.   Objective:   Blood pressure 98/63, pulse 75, temperature 97.9 F (36.6 C), height 5\' 10"  (1.778 m), weight (!) 310 lb (140.6 kg), last menstrual period 08/24/2020, SpO2 96 %. Body mass index is 44.48 kg/m.  General: Cooperative, alert, well developed, in no acute distress. HEENT: Conjunctivae and lids unremarkable. Cardiovascular: Regular rhythm.  Lungs: Normal work of breathing. Neurologic: No focal deficits.   Lab Results  Component Value Date   CREATININE  0.85 03/22/2021   BUN 11 03/22/2021   NA 133 (L) 03/22/2021   K 4.3 03/22/2021   CL 98 03/22/2021   CO2 23 03/22/2021   Lab Results  Component Value Date   ALT 35 02/11/2021   AST 38 02/11/2021   ALKPHOS 172 (H) 02/11/2021    BILITOT 0.1 (L) 02/11/2021   Lab Results  Component Value Date   HGBA1C 11.1 (A) 04/25/2021   HGBA1C 8.3 (H) 12/26/2020   HGBA1C 8.0 (H) 09/21/2020   HGBA1C 5.8 (H) 03/01/2020   HGBA1C 4.9 03/16/2019   Lab Results  Component Value Date   INSULIN 77.2 (H) 10/11/2020   Lab Results  Component Value Date   TSH 5.89 (H) 12/01/2020   Lab Results  Component Value Date   CHOL 155 10/11/2020   HDL 44 10/11/2020   LDLCALC 85 10/11/2020   TRIG 152 (H) 10/11/2020   CHOLHDL 6.0 (H) 03/01/2020   Lab Results  Component Value Date   VD25OH 14.0 (L) 10/11/2020   VD25OH 36 06/25/2018   VD25OH 22.91 (L) 03/25/2018   Lab Results  Component Value Date   WBC 4.4 02/14/2021   HGB 11.9 02/14/2021   HCT 38.4 02/14/2021   MCV 82.2 02/14/2021   PLT 171 02/14/2021   Lab Results  Component Value Date   IRON 33 (L) 12/28/2020   TIBC 269 12/28/2020   FERRITIN 10.6 12/28/2020   Attestation Statements:   Reviewed by clinician on day of visit: allergies, medications, problem list, medical history, surgical history, family history, social history, and previous encounter notes.   I, Trixie Dredge, am acting as transcriptionist for Dennard Nip, MD.  I have reviewed the above documentation for accuracy and completeness, and I agree with the above. -  Dennard Nip, MD

## 2021-05-05 ENCOUNTER — Other Ambulatory Visit (HOSPITAL_COMMUNITY): Payer: Self-pay

## 2021-05-10 DIAGNOSIS — F3181 Bipolar II disorder: Secondary | ICD-10-CM | POA: Diagnosis not present

## 2021-05-17 ENCOUNTER — Other Ambulatory Visit (HOSPITAL_COMMUNITY): Payer: Self-pay

## 2021-05-17 ENCOUNTER — Other Ambulatory Visit: Payer: Self-pay | Admitting: Family Medicine

## 2021-05-17 ENCOUNTER — Encounter: Payer: Self-pay | Admitting: Adult Health

## 2021-05-17 ENCOUNTER — Ambulatory Visit (INDEPENDENT_AMBULATORY_CARE_PROVIDER_SITE_OTHER): Payer: 59 | Admitting: Adult Health

## 2021-05-17 DIAGNOSIS — F4310 Post-traumatic stress disorder, unspecified: Secondary | ICD-10-CM | POA: Diagnosis not present

## 2021-05-17 DIAGNOSIS — G4733 Obstructive sleep apnea (adult) (pediatric): Secondary | ICD-10-CM | POA: Diagnosis not present

## 2021-05-17 DIAGNOSIS — F411 Generalized anxiety disorder: Secondary | ICD-10-CM | POA: Diagnosis not present

## 2021-05-17 DIAGNOSIS — F410 Panic disorder [episodic paroxysmal anxiety] without agoraphobia: Secondary | ICD-10-CM

## 2021-05-17 MED ORDER — NUVIGIL 200 MG PO TABS
200.0000 mg | ORAL_TABLET | Freq: Every day | ORAL | 2 refills | Status: DC | PRN
Start: 2021-05-17 — End: 2022-01-29
  Filled 2021-05-17 – 2021-07-23 (×2): qty 30, 30d supply, fill #0

## 2021-05-17 MED ORDER — SYNTHROID 300 MCG PO TABS
ORAL_TABLET | ORAL | 1 refills | Status: DC
Start: 2021-05-17 — End: 2021-11-27
  Filled 2021-05-17: qty 90, 90d supply, fill #0
  Filled 2021-05-30: qty 90, 90d supply, fill #1

## 2021-05-17 MED ORDER — CLONAZEPAM 1 MG PO TABS
1.0000 mg | ORAL_TABLET | Freq: Three times a day (TID) | ORAL | 5 refills | Status: DC
Start: 2021-05-17 — End: 2021-07-24
  Filled 2021-05-17: qty 30, 10d supply, fill #0
  Filled 2021-06-19: qty 30, 10d supply, fill #1
  Filled 2021-06-30: qty 30, 10d supply, fill #2
  Filled 2021-07-09: qty 30, 10d supply, fill #3
  Filled 2021-07-21: qty 30, 10d supply, fill #4
  Filled 2021-07-23: qty 30, 10d supply, fill #5

## 2021-05-17 NOTE — Progress Notes (Signed)
Tracy [name] [id number] 1966-03-22 55 y.o.  Virtual Visit via Telephone Note  I connected with pt on 05/17/21 at  2:00 PM EDT by telephone and verified that I am speaking with the correct person using two identifiers.   I discussed the limitations, risks, security and privacy concerns of performing an evaluation and management service by telephone and the availability of in person appointments. I also discussed with the patient that there may be a patient responsible charge related to this service. The patient expressed understanding and agreed to proceed.   I discussed the assessment and treatment plan with the patient. The patient was provided an opportunity to ask questions and all were answered. The patient agreed with the plan and demonstrated an understanding of the instructions.   The patient was advised to call back or seek an in-person evaluation if the symptoms worsen or if the condition fails to improve as anticipated.  I provided 25 minutes of non-face-to-face time during this encounter.  The patient was located at home.  The provider was located at [place].   Tracy Gell, NP   Subjective:   Patient ID:  Tracy [name] is a 55 y.o. (DOB 15-Feb-1966) female.  Chief Complaint: No chief complaint on file.   HPI Tracy [name] presents for follow-up of MDD, GAD, PTSD, and Panic attacks.  Describes mood today as "not so good". Pleasant. Tearful. Mood symptoms - reports depression, irritability and anxiety. Denies panic attacks. Reports decreased worry and rumination. Stating "it's still hard for me to function". Diagnosed with iron deficiency anemia and is struggling with energy - taking iron supplements. Plans to restart Nuvigil for fatigue and mental clarity. Hoping to start Spravato treatment. Varying interest and motivation. Taking medications as prescribed Energy levels poor. Active, does not have a regular exercise routine.  Enjoys some usual  interests and activities. Lives alone. Recently divorced. Siblings local. Spending time with family. Appetite adequate. Weight stable.  Sleep has improved. Averages 6 to 8 hours. Sleep apnea - taking Nuvigil in the morning.  Denies SI or HI.  Denies AH or VH.   Previous medication trials:  Antipsychotics: Vraylar-TD, Latuda, Abilify, Geodon, Rexulti, Lybalvi  Mood Stabilizers - Depakote, Lithium, Lamictal, Equetro, Topamax, Trileptal, Gabapentin  SSRI - Zoloft, Lexapro, Celexa, Prozac, Viibryd, Trintellix, Prozac  SNRI - Pristiq, Effexor, Cymbalta  Wellbutrin - allergic reaction  Anti-anxiety - Buspar, Xanax, Clonazepam, Ativan, Valium  Sleep agents - Trazadone, Ambien, Restoril   Other - Deplin, Emsam, Serzone, Doxepin, Nuvigil, Vistaril  Previous treatment: ECT and TMS    Review of Systems:  Review of Systems  Musculoskeletal:  Negative for gait problem.  Neurological:  Negative for tremors.  Psychiatric/Behavioral:         Please refer to HPI   Medications: I have reviewed the patient's current medications.  Current Outpatient Medications  Medication Sig Dispense Refill   AMBULATORY NON FORMULARY MEDICATION Convertible/stand-up workstation such as "[employer]." 1 each 0   Blood Glucose Monitoring Suppl (FREESTYLE LITE) w/Device KIT USE TO CHECK BLOOD SUGAR ONCE A DAY 1 kit 0   Blood Glucose Monitoring Suppl (FREESTYLE LITE) w/Device KIT USE TO CHECK BLOOD SUGAR ONCE A DAY 1 kit 0   clonazePAM (KLONOPIN) 1 MG tablet Take 1 tablet (1 mg total) by mouth 3 (three) times daily. 30 tablet 5   Continuous Blood Gluc Receiver (DEXCOM G6 RECEIVER) DEVI Use to monitor continuous blood glucose. 1 each 0   Continuous Blood Gluc Sensor (DEXCOM G6 SENSOR)  MISC Replace every 10 days.  Use to check blood sugar. 9 each 1   Continuous Blood Gluc Transmit (DEXCOM G6 TRANSMITTER) MISC Use to check continuous blood sugar.  Replace every 90 days 1 each 3   cyclobenzaprine (FLEXERIL) 10 MG  tablet TAKE 1 TABLET BY MOUTH 2 TIMES DAILY AS NEEDED FOR MUSCLE SPASMS (Patient taking differently: Take 10 mg by mouth 2 (two) times daily as needed for muscle spasms.) 20 tablet 0   famotidine (PEPCID) 40 MG tablet Take 1 tablet by mouth 2  times daily. 60 tablet 2   ferrous sulfate 325 (65 FE) MG EC tablet Take 1 tablet (325 mg total) by mouth in the morning and at bedtime. Take iron supplement 2 hours before or 4 hours after PPI or other antacids as this requires some level of gastric acidity to aid in absorption 60 tablet 5   folic acid (FOLVITE) 1 MG tablet Take 1 tablet (1 mg total) by mouth daily. 30 tablet 0   furosemide (LASIX) 40 MG tablet TAKE 1 TABLET BY MOUTH DAILY. 30 tablet 3   glucose blood (FREESTYLE LITE) test strip Use as instructed 100 each 12   glucose blood test strip USE TO CHECK BLOOD SUGAR ONCE A DAY (Patient taking differently: USE TO CHECK BLOOD SUGAR ONCE A DAY) 100 strip 12   hydrOXYzine (ATARAX/VISTARIL) 25 MG tablet Take 1 tablet (25 mg total) by mouth 3 (three) times daily as needed. (Patient taking differently: Take 25 mg by mouth at bedtime as needed (sleep).) 90 tablet 2   insulin degludec (TRESIBA FLEXTOUCH) 100 UNIT/ML FlexTouch Pen Inject 30 Units into the skin 2 (two) times daily. 54 mL 3   insulin lispro (HUMALOG KWIKPEN) 100 UNIT/ML KwikPen Inject up to 16 units 3 times daily at mealtime per sliding scale. 15 mL 11   Insulin Pen Needle (PENTIPS) 31G X 5 MM MISC Use to inject insulin daily (Patient taking differently: Use to inject insulin daily) 100 each 3   Lancets (FREESTYLE) lancets USE TO TEST BLOOD SUGAR ONCE A DAY (Patient taking differently: USE TO TEST BLOOD SUGAR ONCE A DAY) 100 each 0   lidocaine (XYLOCAINE) 5 % ointment APPLY 1 G TOPICALLY 3 (THREE) TIMES DAILY BEFORE MEALS (DOESNT COME IN GEL, FAXED DR) (Patient taking differently: Apply 1 application topically 3 (three) times daily as needed (oral sores).) 1063.2 g 2   lisinopril (ZESTRIL) 10 MG  tablet Take 1 tablet (10 mg total) by mouth daily. 90 tablet 3   meloxicam (MOBIC) 15 MG tablet Take one tab by mouth every morning with a meal for 2 weeks, then daily as needed for pain. 30 tablet 0   NUVIGIL 200 MG TABS Take 200 mg by mouth daily as needed (sleepiness). 30 tablet 2   nystatin (MYCOSTATIN/NYSTOP) powder Apply 3 times daily as directed. (Patient taking differently: Apply 1 application topically daily.) 60 g 3   ondansetron (ZOFRAN-ODT) 4 MG disintegrating tablet Take 4 mg by mouth 2 (two) times daily as needed for nausea or vomiting.     pimecrolimus (ELIDEL) 1 % cream APPLY TOPICALLY 2 TIMES DAILY TO COMPLETELY DRY AREA PLACE A THIN LAYER ONTO THE MOST SORE AREAS PRIOR TO BED. DO NOT RINSE (Patient taking differently: Apply 1 application topically 2 (two) times daily as needed (mouth sores).) 60 g 0   RABEprazole (ACIPHEX) 20 MG tablet Take 1 tablet (20 mg total) by mouth in the morning and at bedtime. 180 tablet 1   rizatriptan (MAXALT)  10 MG tablet Take 1 tablet (10 mg total) by mouth as needed for migraine. May repeat in 2 hours if needed (Patient taking differently: Take 10 mg by mouth every 2 (two) hours as needed for migraine. May repeat in 2 hours if needed) 10 tablet 0   rosuvastatin (CRESTOR) 20 MG tablet TAKE 1 TABLET BY MOUTH DAILY. 90 tablet 3   Semaglutide (RYBELSUS) 7 MG TABS Take 1 tablet (72m) by mouth daily. 30 tablet 3   sertraline (ZOLOFT) 100 MG tablet TAKE 2 TABLETS BY MOUTH EVERY MORNING 180 tablet 3   spironolactone (ALDACTONE) 50 MG tablet Take 1 tablet (50 mg total) by mouth 2 (two) times daily. 60 tablet 2   sucralfate (CARAFATE) 1 g tablet Take 1 tablet (1 g total) by mouth 4 (four) times daily -  with meals and at bedtime for 15 days. 60 tablet 1   SYNTHROID 300 MCG tablet TAKE 1 TABLET BY MOUTH ONCE A DAY BEFORE BREAKFAST (Patient taking differently: Take 300 mcg by mouth daily before breakfast.) 90 tablet 1   Vitamin D, Ergocalciferol, (DRISDOL) 1.25 MG  (50000 UNIT) CAPS capsule TAKE 1 CAPSULE BY MOUTH EVERY 7 DAYS. (Patient taking differently: Take 50,000 Units by mouth every Tuesday.) 12 capsule 1   No current facility-administered medications for this visit.    Medication Side Effects: None  Allergies:  Allergies  Allergen Reactions   Sulfa Antibiotics Itching, Swelling and Rash   Food     Walnut-mouth swelling/itching   Bupropion Hcl Itching and Rash   Codeine Itching   Sulfamethoxazole-Trimethoprim Rash    Past Medical History:  Diagnosis Date   Anxiety    Arthritis    back, knees, right elbow   Back pain    Bilateral swelling of feet and ankles    Bipolar disorder (HCC)    Borderline personality disorder ([place]    Cancer (HCC)    oral   Chewing difficulty    Cirrhosis (HCC)    Constipation    Dental crowns present    Depression    Diabetes mellitus without complication ([facility]    Fatty liver    Food allergy    Walnuts   GAD (generalized anxiety disorder)    GERD (gastroesophageal reflux disease)    High cholesterol    no current med.   History of kidney stones    History of migraine headaches    Hypertension    under control with meds., has been on med. x 4 yr.   Hypothyroidism    IBS (irritable bowel syndrome)    Infertility, female    Joint pain    Lichen planus    Liver cirrhosis secondary to NASH (nonalcoholic steatohepatitis) (HCC)    NASH (nonalcoholic steatohepatitis)    Osteoarthritis    Other fatigue    Palpitations    PCOS (polycystic ovarian syndrome)    PONV (postoperative nausea and vomiting)    also hx. of emergence delirium 2007   Shortness of breath on exertion    Sleep apnea    uses CPAP nightly   Trigeminal neuralgia    Type 2 diabetes mellitus ([facility]    Vitamin D deficiency     Family History  Problem Relation Age of Onset   Heart disease Father    Depression Father    Heart attack Father    Sudden death Father    Anxiety disorder Father    Bipolar disorder Father     Obesity Father    Cancer  Brother        THROAT cancer   Depression Brother    Anxiety disorder Brother    Depression Sister    Breast cancer Mother    Hypertension Mother    Cancer Mother    Depression Mother    Sleep apnea Mother    Obesity Mother    Depression Maternal Grandmother    Stroke Maternal Grandfather    Colon cancer Brother    Pancreatic cancer Neg Hx    Stomach cancer Neg Hx    Liver disease Neg Hx     Social History   Socioeconomic History   Marital status: Divorced    Spouse name: Not on file   Number of children: 0   Years of education: 16   Highest education level: Not on file  Occupational History   Occupation: RN-Telephone Geophysical data processor: [employer]    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   Drug use: No   Sexual activity: Not Currently    Partners: Male    Birth control/protection: Condom  Other Topics Concern   Not on file  Social History Narrative   RN - [employer]   Regular exercise: no   Caffeine use: 2 x daily   Right-handed.   Lives alone.   Social Determinants of Health   Financial Resource Strain: Not on file  Food Insecurity: Not on file  Transportation Needs: Not on file  Physical Activity: Not on file  Stress: Not on file  Social Connections: Not on file  Intimate Partner Violence: Not on file    Past Medical History, Surgical history, Social history, and Family history were reviewed and updated as appropriate.   Please see review of systems for further details on the patient's review from today.    Objective:   Physical Exam:  LMP 08/24/2020   Physical Exam Constitutional:      General: She is not in acute distress. Musculoskeletal:        General: No deformity.  Neurological:     Mental Status: She is alert and oriented to person, place, and time.     Coordination: Coordination normal.  Psychiatric:        Attention and Perception: Attention and perception normal. She does not perceive auditory or visual hallucinations.        Mood and Affect: Mood normal. Mood is not anxious or depressed. Affect is not labile, blunt, angry or inappropriate.        Speech: Speech normal.        Behavior: Behavior normal.        Thought Content: Thought content normal. Thought content is not paranoid or delusional. Thought content does not include homicidal or suicidal ideation. Thought content does not include homicidal or suicidal plan.        Cognition and Memory: Cognition and memory normal.        Judgment: Judgment normal.     Comments: Insight intact  Lab Review:     Component Value Date/Time   NA 133 (L) 03/22/2021 0000   NA 140 10/11/2020 1041   K 4.3 03/22/2021 0000   CL 98 03/22/2021 0000   CO2 23 03/22/2021 0000   GLUCOSE 342 (H) 03/22/2021 0000   BUN 11 03/22/2021 0000   BUN 14 10/11/2020 1041   CREATININE 0.85 03/22/2021 0000   CALCIUM 9.7 03/22/2021 0000   PROT 7.0 02/11/2021 1516   PROT 7.1 10/11/2020 1041   ALBUMIN 3.8 02/11/2021 1516   ALBUMIN 4.2 10/11/2020 1041   AST 38 02/11/2021 1516   ALT 35 02/11/2021 1516   ALKPHOS 172 (H) 02/11/2021 1516   BILITOT 0.1 (L) 02/11/2021 1516   BILITOT <0.2 10/11/2020 1041   GFRNONAA >60 02/11/2021 1516   GFRNONAA 99 12/26/2020 0000   GFRAA 115 12/26/2020 0000       Component Value Date/Time   WBC 4.4 02/14/2021 0000   RBC 4.67 02/14/2021 0000   HGB 11.9 02/14/2021 0000   HGB 10.9 (L) 10/11/2020 1041   HCT 38.4 02/14/2021 0000   HCT 35.6 10/11/2020 1041   PLT 171 02/14/2021 0000   PLT 224 10/11/2020 1041    MCV 82.2 02/14/2021 0000   MCV 78 (L) 10/11/2020 1041   MCH 25.5 (L) 02/14/2021 0000   MCHC 31.0 (L) 02/14/2021 0000   RDW 20.0 (H) 02/14/2021 0000   RDW 17.5 (H) 10/11/2020 1041   LYMPHSABS 1,448 02/14/2021 0000   LYMPHSABS 1.7 10/11/2020 1041   MONOABS 0.3 02/11/2021 1516   EOSABS 141 02/14/2021 0000   EOSABS 0.2 10/11/2020 1041   BASOSABS 22 02/14/2021 0000   BASOSABS 0.0 10/11/2020 1041    No results found for: POCLITH, LITHIUM   No results found for: PHENYTOIN, PHENOBARB, VALPROATE, CBMZ   .res Assessment: Plan:    Plan:  PDMP reviewed  Lamictal 2105m daily Zoloft 1052m- 2 daily Nuvigil 20068maily  Clonazepam 1mg37mnax XR 0.5mg 3mdroxyzine 25mg 57m- using 1 tablet at bedtime.   Cathy Rea Collegerapist  Planning to start Spravato treatment  RTC 4 weeks  Has tried multiple medications without any long term relief of symptoms. Has also participated in TMS x [facility]. Felt like TMS waJoannaelpful initially and tried it a second time without further success. Does not feel current medications are offering symptom relief. Cuurently awaiting Spravato treatment.  Patient advised to contact office with any questions, adverse effects, or acute worsening in signs and symptoms.  Counseled patient regarding potential benefits, risks, and side effects of Lamictal to include potential risk of Stevens-Johnson syndrome. Advised patient to stop taking Lamictal and contact office immediately if rash develops and to seek urgent medical attention if rash is severe and/or spreading quickly.   Discussed potential benefits, risk, and side effects of benzodiazepines to include potential risk of tolerance and dependence, as well as possible drowsiness.  Advised patient not to drive if experiencing drowsiness and to take lowest possible effective dose to minimize risk of dependence and tolerance.  Discussed potential metabolic side effects associated with atypical antipsychotics, as well  as potential risk for movement side effects. Advised pt to contact office if movement side effects occur.    Diagnoses and all orders for this visit:  OSA (obstructive sleep apnea) -     NUVIGIL 200 MG TABS; Take 200 mg by mouth daily as needed (sleepiness).  GAD (generalized anxiety disorder)  Panic attacks -     clonazePAM (KLONOPIN) 1  MG tablet; Take 1 tablet (1 mg total) by mouth 3 (three) times daily.  PTSD (post-traumatic stress disorder)   Please see After Visit Summary for patient specific instructions.  Future Appointments  Date Time Provider [place]  05/23/2021 12:40 PM Starlyn Skeans, MD [facility] None  06/27/2021  3:20 PM Revankar, Reita Cliche, MD [facility] None  07/25/2021  2:30 PM Luetta Nutting, DO [facility] None    No orders of the defined types were placed in this encounter.     -------------------------------

## 2021-05-18 ENCOUNTER — Other Ambulatory Visit (HOSPITAL_COMMUNITY): Payer: Self-pay

## 2021-05-21 ENCOUNTER — Other Ambulatory Visit (HOSPITAL_COMMUNITY): Payer: Self-pay

## 2021-05-21 ENCOUNTER — Other Ambulatory Visit: Payer: Self-pay | Admitting: Family Medicine

## 2021-05-21 DIAGNOSIS — E559 Vitamin D deficiency, unspecified: Secondary | ICD-10-CM

## 2021-05-21 MED FILL — Glucose Blood Test Strip: 90 days supply | Qty: 100 | Fill #1 | Status: AC

## 2021-05-22 ENCOUNTER — Other Ambulatory Visit (HOSPITAL_COMMUNITY): Payer: Self-pay

## 2021-05-23 ENCOUNTER — Encounter (INDEPENDENT_AMBULATORY_CARE_PROVIDER_SITE_OTHER): Payer: Self-pay | Admitting: Family Medicine

## 2021-05-23 ENCOUNTER — Other Ambulatory Visit (HOSPITAL_COMMUNITY): Payer: Self-pay

## 2021-05-23 ENCOUNTER — Ambulatory Visit (INDEPENDENT_AMBULATORY_CARE_PROVIDER_SITE_OTHER): Payer: 59 | Admitting: Family Medicine

## 2021-05-23 ENCOUNTER — Other Ambulatory Visit: Payer: Self-pay

## 2021-05-23 VITALS — BP 107/69 | HR 57 | Temp 98.0°F | Ht 70.0 in | Wt 310.0 lb

## 2021-05-23 DIAGNOSIS — Z6841 Body Mass Index (BMI) 40.0 and over, adult: Secondary | ICD-10-CM | POA: Diagnosis not present

## 2021-05-23 DIAGNOSIS — Z794 Long term (current) use of insulin: Secondary | ICD-10-CM

## 2021-05-23 DIAGNOSIS — Z9189 Other specified personal risk factors, not elsewhere classified: Secondary | ICD-10-CM | POA: Diagnosis not present

## 2021-05-23 DIAGNOSIS — E1165 Type 2 diabetes mellitus with hyperglycemia: Secondary | ICD-10-CM

## 2021-05-23 DIAGNOSIS — E559 Vitamin D deficiency, unspecified: Secondary | ICD-10-CM | POA: Diagnosis not present

## 2021-05-23 MED ORDER — VITAMIN D (ERGOCALCIFEROL) 1.25 MG (50000 UNIT) PO CAPS
ORAL_CAPSULE | ORAL | 1 refills | Status: DC
  Filled 2021-05-23: qty 12, 84d supply, fill #0
  Filled 2021-08-15: qty 12, 84d supply, fill #1

## 2021-05-23 MED ORDER — RYBELSUS 14 MG PO TABS
14.0000 mg | ORAL_TABLET | Freq: Every day | ORAL | 0 refills | Status: DC
  Filled 2021-05-23: qty 30, 30d supply, fill #0

## 2021-05-23 NOTE — Progress Notes (Signed)
Chief Complaint:   OBESITY Tracy [name] is here to discuss her progress with her obesity treatment plan along with follow-up of her obesity related diagnoses. Tracy [name] is on the Category 3 Plan and states she is following her eating plan approximately 50% of the time. Tracy [name] states she is doing 0 minutes 0 times per week.  Today's visit was #: 6 Starting weight: 336 lbs Starting date: 10/11/2020 Today's weight: 310 lbs Today's date: 05/23/2021 Total lbs lost to date: 26 Total lbs lost since last in-office visit: 0  Interim History: Tracy [name] has been eating little in the daytime and then eating more in the evenings. She has had increased sabotage from her ex-husband and she has had extra temptations.  Subjective:   1. Type 2 diabetes mellitus with hyperglycemia, with long-term current use of insulin (HCC) Tracy [name] is doing well with diet and weight loss. Her recent A1c was 11.1. She is tolerating Rybelsus well, and she denies nausea or vomiting.  2. Vitamin D deficiency Tracy [name] is on Vit D, and her level is not yet at goal.  3. At risk for heart disease Tracy [name] is at a higher than average risk for cardiovascular disease due to obesity.   Assessment/Plan:   1. Type 2 diabetes mellitus with hyperglycemia, with long-term current use of insulin Tracy [name]) Tracy [name] agreed to increase Rybelsus to 14 mg q daily #30 with no refills, and she will continue her other medications. Good blood sugar control is important to decrease the likelihood of diabetic complications such as nephropathy, neuropathy, limb loss, blindness, coronary artery disease, and death. Intensive lifestyle modification including diet, exercise and weight loss are the first line of treatment for diabetes.   2. Vitamin D deficiency Low Vitamin D level contributes to fatigue and are associated with obesity, breast, and colon cancer. We will refill prescription Vitamin D 50,000 IU every week #4 for 90 days with no refills. Tracy [name] will follow-up for  routine testing of Vitamin D, at least 2-3 times per year to avoid over-replacement.  3. At risk for heart disease Tracy [name] was given approximately 15 minutes of coronary artery disease prevention counseling today. She is 55 y.o. female and has risk factors for heart disease including obesity. We discussed intensive lifestyle modifications today with an emphasis on specific weight loss instructions and strategies.   Repetitive spaced learning was employed today to elicit superior memory formation and behavioral change.  4. Obesity with current BMI of 44.6 Tracy [name] is currently in the action stage of change. As such, her goal is to continue with weight loss efforts. She has agreed to the Category 3 Plan.   Behavioral modification strategies: increasing lean protein intake, dealing with family or coworker sabotage, and planning for success.  Tracy [name] has agreed to follow-up with our clinic in 2 to 3 weeks. She was informed of the importance of frequent follow-up visits to maximize her success with intensive lifestyle modifications for her multiple health conditions.   Objective:   Blood pressure 107/69, pulse (!) 57, temperature 98 F (36.7 C), height 5\' 10"  (1.778 m), weight (!) 310 lb (140.6 kg), last menstrual period 08/24/2020, SpO2 97 %. Body mass index is 44.48 kg/m.  General: Cooperative, alert, well developed, in no acute distress. HEENT: Conjunctivae and lids unremarkable. Cardiovascular: Regular rhythm.  Lungs: Normal work of breathing. Neurologic: No focal deficits.   Lab Results  Component Value Date   CREATININE 0.85 03/22/2021   BUN 11 03/22/2021   NA 133 (L) 03/22/2021   K  4.3 03/22/2021   CL 98 03/22/2021   CO2 23 03/22/2021   Lab Results  Component Value Date   ALT 35 02/11/2021   AST 38 02/11/2021   ALKPHOS 172 (H) 02/11/2021   BILITOT 0.1 (L) 02/11/2021   Lab Results  Component Value Date   HGBA1C 11.1 (A) 04/25/2021   HGBA1C 8.3 (H) 12/26/2020   HGBA1C 8.0 (H)  09/21/2020   HGBA1C 5.8 (H) 03/01/2020   HGBA1C 4.9 03/16/2019   Lab Results  Component Value Date   INSULIN 77.2 (H) 10/11/2020   Lab Results  Component Value Date   TSH 5.89 (H) 12/01/2020   Lab Results  Component Value Date   CHOL 155 10/11/2020   HDL 44 10/11/2020   LDLCALC 85 10/11/2020   TRIG 152 (H) 10/11/2020   CHOLHDL 6.0 (H) 03/01/2020   Lab Results  Component Value Date   VD25OH 14.0 (L) 10/11/2020   VD25OH 36 06/25/2018   VD25OH 22.91 (L) 03/25/2018   Lab Results  Component Value Date   WBC 4.4 02/14/2021   HGB 11.9 02/14/2021   HCT 38.4 02/14/2021   MCV 82.2 02/14/2021   PLT 171 02/14/2021   Lab Results  Component Value Date   IRON 33 (L) 12/28/2020   TIBC 269 12/28/2020   FERRITIN 10.6 12/28/2020   Attestation Statements:   Reviewed by clinician on day of visit: allergies, medications, problem list, medical history, surgical history, family history, social history, and previous encounter notes.   I, Trixie Dredge, am acting as transcriptionist for Dennard Nip, MD.  I have reviewed the above documentation for accuracy and completeness, and I agree with the above. -  Dennard Nip, MD

## 2021-05-24 DIAGNOSIS — F3181 Bipolar II disorder: Secondary | ICD-10-CM | POA: Diagnosis not present

## 2021-05-25 ENCOUNTER — Telehealth: Payer: Self-pay

## 2021-05-25 NOTE — Telephone Encounter (Signed)
Prior authorization submitted for NUVIGIL 200 MG with [employer], determination was DENIED. This medication is excluded from her pharmacy benefit. Covered alternatives may include Armodafinil and Modafinil.   Left pt message with information.   Will update Rollene Fare on how to proceed.

## 2021-05-25 NOTE — Telephone Encounter (Signed)
Noted  

## 2021-05-28 ENCOUNTER — Other Ambulatory Visit (HOSPITAL_COMMUNITY): Payer: Self-pay

## 2021-05-29 ENCOUNTER — Telehealth: Payer: 59

## 2021-05-29 ENCOUNTER — Encounter: Payer: Self-pay | Admitting: Family Medicine

## 2021-05-30 ENCOUNTER — Other Ambulatory Visit (INDEPENDENT_AMBULATORY_CARE_PROVIDER_SITE_OTHER): Payer: Self-pay | Admitting: Family Medicine

## 2021-05-30 ENCOUNTER — Ambulatory Visit: Payer: 59 | Attending: Internal Medicine

## 2021-05-30 ENCOUNTER — Other Ambulatory Visit: Payer: Self-pay | Admitting: Gastroenterology

## 2021-05-30 DIAGNOSIS — Z23 Encounter for immunization: Secondary | ICD-10-CM

## 2021-05-30 DIAGNOSIS — E538 Deficiency of other specified B group vitamins: Secondary | ICD-10-CM

## 2021-05-30 NOTE — Progress Notes (Signed)
   Covid-19 Vaccination Clinic  Name:  DALAYA SUPPA    MRN: [id number] DOB: [birth year]  05/30/2021  [name] was observed post Covid-19 immunization for 15 minutes without incident. She was provided with Vaccine Information Sheet and instruction to access the V-Safe system.   [name] was instructed to call 911 with any severe reactions post vaccine: Difficulty breathing  Swelling of face and throat  A fast heartbeat  A bad rash all over body  Dizziness and weakness   Immunizations Administered     Name Date Dose VIS Date Route   Pfizer Covid-19 Vaccine Bivalent Booster 05/30/2021 10:01 AM 0.3 mL 04/04/2021 Intramuscular   Manufacturer: [place]   Lot: [id number]   [place]: [id number]

## 2021-05-31 ENCOUNTER — Other Ambulatory Visit (HOSPITAL_COMMUNITY): Payer: Self-pay

## 2021-05-31 MED ORDER — FAMOTIDINE 40 MG PO TABS
40.0000 mg | ORAL_TABLET | Freq: Two times a day (BID) | ORAL | 1 refills | Status: DC
  Filled 2021-05-31 – 2021-07-21 (×2): qty 180, 90d supply, fill #0
  Filled 2021-07-23: qty 180, 90d supply, fill #1

## 2021-06-01 ENCOUNTER — Other Ambulatory Visit (HOSPITAL_COMMUNITY): Payer: Self-pay

## 2021-06-06 ENCOUNTER — Encounter (INDEPENDENT_AMBULATORY_CARE_PROVIDER_SITE_OTHER): Payer: Self-pay | Admitting: Family Medicine

## 2021-06-06 ENCOUNTER — Ambulatory Visit (INDEPENDENT_AMBULATORY_CARE_PROVIDER_SITE_OTHER): Payer: 59 | Admitting: Physician Assistant

## 2021-06-07 NOTE — Telephone Encounter (Signed)
Please see message and advise.  Thank you. ° °

## 2021-06-13 DIAGNOSIS — G4733 Obstructive sleep apnea (adult) (pediatric): Secondary | ICD-10-CM | POA: Diagnosis not present

## 2021-06-17 ENCOUNTER — Other Ambulatory Visit (INDEPENDENT_AMBULATORY_CARE_PROVIDER_SITE_OTHER): Payer: Self-pay | Admitting: Family Medicine

## 2021-06-17 DIAGNOSIS — E538 Deficiency of other specified B group vitamins: Secondary | ICD-10-CM

## 2021-06-18 ENCOUNTER — Other Ambulatory Visit (HOSPITAL_COMMUNITY): Payer: Self-pay

## 2021-06-18 MED ORDER — FOLIC ACID 1 MG PO TABS
1.0000 mg | ORAL_TABLET | Freq: Every day | ORAL | 0 refills | Status: DC
  Filled 2021-06-18: qty 30, 30d supply, fill #0

## 2021-06-18 NOTE — Telephone Encounter (Signed)
LAST APPOINTMENT DATE: 05/23/21 NEXT APPOINTMENT DATE: 06/20/21   [place] [address] Phone: [phone] Fax: [phone]  Patient is requesting a refill of the following medications: Requested Prescriptions   Pending Prescriptions Disp Refills   folic acid (FOLVITE) 1 MG tablet 30 tablet 0    Sig: Take 1 tablet (1 mg total) by mouth daily.    Date last filled: 05/03/21 Previously prescribed by Dr. Leafy Ro  Lab Results  Component Value Date   HGBA1C 11.1 (A) 04/25/2021   HGBA1C 8.3 (H) 12/26/2020   HGBA1C 8.0 (H) 09/21/2020   Lab Results  Component Value Date   MICROALBUR 0.6 01/31/2017   LDLCALC 85 10/11/2020   CREATININE 0.85 03/22/2021   Lab Results  Component Value Date   VD25OH 14.0 (L) 10/11/2020   VD25OH 36 06/25/2018   VD25OH 22.91 (L) 03/25/2018    BP Readings from Last 3 Encounters:  05/23/21 107/69  05/03/21 98/63  04/25/21 95/64

## 2021-06-18 NOTE — Telephone Encounter (Signed)
Pt last seen by Dr. Beasley.  

## 2021-06-18 NOTE — Telephone Encounter (Signed)
No info is with this

## 2021-06-19 ENCOUNTER — Other Ambulatory Visit: Payer: Self-pay | Admitting: Family Medicine

## 2021-06-20 ENCOUNTER — Ambulatory Visit (INDEPENDENT_AMBULATORY_CARE_PROVIDER_SITE_OTHER): Payer: 59 | Admitting: Family Medicine

## 2021-06-20 ENCOUNTER — Other Ambulatory Visit (HOSPITAL_COMMUNITY): Payer: Self-pay

## 2021-06-20 ENCOUNTER — Other Ambulatory Visit: Payer: Self-pay

## 2021-06-20 ENCOUNTER — Encounter (INDEPENDENT_AMBULATORY_CARE_PROVIDER_SITE_OTHER): Payer: Self-pay | Admitting: Family Medicine

## 2021-06-20 VITALS — BP 99/67 | HR 74 | Temp 98.1°F | Ht 70.0 in | Wt 303.0 lb

## 2021-06-20 DIAGNOSIS — E7849 Other hyperlipidemia: Secondary | ICD-10-CM

## 2021-06-20 DIAGNOSIS — Z794 Long term (current) use of insulin: Secondary | ICD-10-CM | POA: Diagnosis not present

## 2021-06-20 DIAGNOSIS — Z9189 Other specified personal risk factors, not elsewhere classified: Secondary | ICD-10-CM | POA: Diagnosis not present

## 2021-06-20 DIAGNOSIS — Z6841 Body Mass Index (BMI) 40.0 and over, adult: Secondary | ICD-10-CM

## 2021-06-20 DIAGNOSIS — E1165 Type 2 diabetes mellitus with hyperglycemia: Secondary | ICD-10-CM | POA: Diagnosis not present

## 2021-06-20 MED ORDER — SPIRONOLACTONE 50 MG PO TABS
50.0000 mg | ORAL_TABLET | Freq: Two times a day (BID) | ORAL | 2 refills | Status: DC
  Filled 2021-06-20 – 2021-06-30 (×2): qty 60, 30d supply, fill #0
  Filled 2021-07-23: qty 60, 30d supply, fill #1
  Filled 2021-07-30 – 2021-08-27 (×2): qty 60, 30d supply, fill #2

## 2021-06-20 MED ORDER — RYBELSUS 14 MG PO TABS
14.0000 mg | ORAL_TABLET | Freq: Every day | ORAL | 0 refills | Status: DC
  Filled 2021-06-20: qty 30, 30d supply, fill #0

## 2021-06-20 NOTE — Progress Notes (Signed)
Chief Complaint:   OBESITY Tracy [name] is here to discuss her progress with her obesity treatment plan along with follow-up of her obesity related diagnoses. Tracy [name] is on the Category 3 Plan and states she is following her eating plan approximately 50% of the time. Tracy [name] states she is doing 0 minutes 0 times per week.  Today's visit was #: 7 Starting weight: 336 lbs Starting date: 10/11/2020 Today's weight: 303 lbs Today's date: 06/20/2021 Total lbs lost to date: 33 Total lbs lost since last in-office visit: 7  Interim History: Tracy [name] is considering having weight loss surgery and she has questions about this operation. She has multiple comorbidities including diabetes mellitus, non-alcoholic fatty liver disease, hyperlipidemia, and depression. She did the online seminar at [facility], and she is waiting to get her first appointment.  Subjective:   1. Type 2 diabetes mellitus with hyperglycemia, with long-term current use of insulin [facility]) Tracy [name] is on multiple medications. She is doing well on Rybelsus and her diet. She has no signs of hypoglycemia.  2. Other hyperlipidemia Tracy [name] is on Crestor and she is working on weight loss, and decreasing cholesterol in her diet. She denies chest pain or myalgias.  3. At risk for heart disease Tracy [name] is at a higher than average risk for cardiovascular disease due to obesity.   Assessment/Plan:   1. Type 2 diabetes mellitus with hyperglycemia, with long-term current use of insulin (HCC) We will refill Rybelsus 14 mg q daily #30 for 1 month. Tracy [name] will continue to follow up as directed. Good blood sugar control is important to decrease the likelihood of diabetic complications such as nephropathy, neuropathy, limb loss, blindness, coronary artery disease, and death. Intensive lifestyle modification including diet, exercise and weight loss are the first line of treatment for diabetes.   2. Other hyperlipidemia Cardiovascular risk and specific  lipid/LDL goals reviewed. We discussed several lifestyle modifications today. Tracy [name] will continue Crestor, and diet, and will continue to follow up as directed. Orders and follow up as documented in patient record.   3. At risk for heart disease Tracy [name] was given approximately 15 minutes of coronary artery disease prevention counseling today. She is 55 y.o. female and has risk factors for heart disease including obesity. We discussed intensive lifestyle modifications today with an emphasis on specific weight loss instructions and strategies.   Repetitive spaced learning was employed today to elicit superior memory formation and behavioral change.  4. Obesity with current BMI of 43.5 Tracy [name] is currently in the action stage of change. As such, her goal is to continue with weight loss efforts. She has agreed to the Category 3 Plan.   Tracy [name] was educated on weight loss surgery including the risks and benefits, and the process of approval with insurance. She continues to do well with diet, exercise, and weight loss.  Behavioral modification strategies: increasing lean protein intake.  Tracy [name] has agreed to follow-up with our clinic in 4 weeks. She was informed of the importance of frequent follow-up visits to maximize her success with intensive lifestyle modifications for her multiple health conditions.   Objective:   Blood pressure 99/67, pulse 74, temperature 98.1 F (36.7 C), height 5\' 10"  (1.778 m), weight (!) 303 lb (137.4 kg), last menstrual period 08/24/2020, SpO2 98 %. Body mass index is 43.48 kg/m.  General: Cooperative, alert, well developed, in no acute distress. HEENT: Conjunctivae and lids unremarkable. Cardiovascular: Regular rhythm.  Lungs: Normal work of breathing. Neurologic: No focal deficits.   Lab Results  Component Value Date   CREATININE 0.85 03/22/2021   BUN 11 03/22/2021   NA 133 (L) 03/22/2021   K 4.3 03/22/2021   CL 98 03/22/2021   CO2 23 03/22/2021   Lab Results   Component Value Date   ALT 35 02/11/2021   AST 38 02/11/2021   ALKPHOS 172 (H) 02/11/2021   BILITOT 0.1 (L) 02/11/2021   Lab Results  Component Value Date   HGBA1C 11.1 (A) 04/25/2021   HGBA1C 8.3 (H) 12/26/2020   HGBA1C 8.0 (H) 09/21/2020   HGBA1C 5.8 (H) 03/01/2020   HGBA1C 4.9 03/16/2019   Lab Results  Component Value Date   INSULIN 77.2 (H) 10/11/2020   Lab Results  Component Value Date   TSH 5.89 (H) 12/01/2020   Lab Results  Component Value Date   CHOL 155 10/11/2020   HDL 44 10/11/2020   LDLCALC 85 10/11/2020   TRIG 152 (H) 10/11/2020   CHOLHDL 6.0 (H) 03/01/2020   Lab Results  Component Value Date   VD25OH 14.0 (L) 10/11/2020   VD25OH 36 06/25/2018   VD25OH 22.91 (L) 03/25/2018   Lab Results  Component Value Date   WBC 4.4 02/14/2021   HGB 11.9 02/14/2021   HCT 38.4 02/14/2021   MCV 82.2 02/14/2021   PLT 171 02/14/2021   Lab Results  Component Value Date   IRON 33 (L) 12/28/2020   TIBC 269 12/28/2020   FERRITIN 10.6 12/28/2020   Attestation Statements:   Reviewed by clinician on day of visit: allergies, medications, problem list, medical history, surgical history, family history, social history, and previous encounter notes.   I, Trixie Dredge, am acting as transcriptionist for Dennard Nip, MD.  I have reviewed the above documentation for accuracy and completeness, and I agree with the above. -  Dennard Nip, MD

## 2021-06-21 DIAGNOSIS — F3181 Bipolar II disorder: Secondary | ICD-10-CM | POA: Diagnosis not present

## 2021-06-22 ENCOUNTER — Other Ambulatory Visit (HOSPITAL_BASED_OUTPATIENT_CLINIC_OR_DEPARTMENT_OTHER): Payer: Self-pay

## 2021-06-22 MED ORDER — PFIZER COVID-19 VAC BIVALENT 30 MCG/0.3ML IM SUSP
INTRAMUSCULAR | 0 refills | Status: DC
  Filled 2021-06-22: qty 0.3, 1d supply, fill #0

## 2021-06-25 ENCOUNTER — Other Ambulatory Visit (HOSPITAL_COMMUNITY): Payer: Self-pay

## 2021-06-27 ENCOUNTER — Ambulatory Visit: Payer: 59 | Admitting: Cardiology

## 2021-06-30 ENCOUNTER — Other Ambulatory Visit: Payer: Self-pay | Admitting: Family Medicine

## 2021-06-30 ENCOUNTER — Other Ambulatory Visit (HOSPITAL_COMMUNITY): Payer: Self-pay

## 2021-06-30 DIAGNOSIS — M62831 Muscle spasm of calf: Secondary | ICD-10-CM

## 2021-07-02 ENCOUNTER — Other Ambulatory Visit: Payer: Self-pay

## 2021-07-02 ENCOUNTER — Other Ambulatory Visit (HOSPITAL_COMMUNITY): Payer: Self-pay

## 2021-07-02 MED ORDER — SPRAVATO (56 MG DOSE) 28 MG/DEVICE NA SOPK
PACK | NASAL | 0 refills | Status: DC
Start: 2021-07-02 — End: 2022-01-01

## 2021-07-02 MED ORDER — SPRAVATO (84 MG DOSE) 28 MG/DEVICE NA SOPK: PACK | NASAL | 3 refills | Status: DC

## 2021-07-02 MED ORDER — SPRAVATO (56 MG DOSE) 28 MG/DEVICE NA SOPK: PACK | NASAL | 0 refills | Status: DC

## 2021-07-02 MED ORDER — CYCLOBENZAPRINE HCL 10 MG PO TABS
ORAL_TABLET | ORAL | 0 refills | Status: DC
  Filled 2021-07-02: qty 20, 10d supply, fill #0

## 2021-07-05 ENCOUNTER — Other Ambulatory Visit (INDEPENDENT_AMBULATORY_CARE_PROVIDER_SITE_OTHER): Payer: Self-pay | Admitting: Family Medicine

## 2021-07-05 DIAGNOSIS — E538 Deficiency of other specified B group vitamins: Secondary | ICD-10-CM

## 2021-07-06 ENCOUNTER — Encounter (INDEPENDENT_AMBULATORY_CARE_PROVIDER_SITE_OTHER): Payer: Self-pay | Admitting: Family Medicine

## 2021-07-07 ENCOUNTER — Other Ambulatory Visit (HOSPITAL_COMMUNITY): Payer: Self-pay

## 2021-07-09 ENCOUNTER — Other Ambulatory Visit (HOSPITAL_COMMUNITY): Payer: Self-pay

## 2021-07-09 MED ORDER — FOLIC ACID 1 MG PO TABS
1.0000 mg | ORAL_TABLET | Freq: Every day | ORAL | 0 refills | Status: DC
  Filled 2021-07-09 – 2021-07-21 (×2): qty 30, 30d supply, fill #0

## 2021-07-09 NOTE — Telephone Encounter (Signed)
Dr.Beasley 

## 2021-07-09 NOTE — Telephone Encounter (Signed)
LAST APPOINTMENT DATE: 06/20/21 NEXT APPOINTMENT DATE: 07/19/21   [place] [address] Phone: [phone] Fax: [phone]  [place], [place] [address] Phone: [phone] Fax: [phone]  Patient is requesting a refill of the following medications: Requested Prescriptions   Pending Prescriptions Disp Refills   folic acid (FOLVITE) 1 MG tablet 30 tablet 0    Sig: Take 1 tablet (1 mg total) by mouth daily.    Date last filled: 06/18/21 Previously prescribed by Dr. Leafy Ro  Lab Results  Component Value Date   HGBA1C 11.1 (A) 04/25/2021   HGBA1C 8.3 (H) 12/26/2020   HGBA1C 8.0 (H) 09/21/2020   Lab Results  Component Value Date   MICROALBUR 0.6 01/31/2017   LDLCALC 85 10/11/2020   CREATININE 0.85 03/22/2021   Lab Results  Component Value Date   VD25OH 14.0 (L) 10/11/2020   VD25OH 36 06/25/2018   VD25OH 22.91 (L) 03/25/2018    BP Readings from Last 3 Encounters:  06/20/21 99/67  05/23/21 107/69  05/03/21 98/63

## 2021-07-11 ENCOUNTER — Other Ambulatory Visit (HOSPITAL_COMMUNITY): Payer: Self-pay

## 2021-07-11 ENCOUNTER — Ambulatory Visit (INDEPENDENT_AMBULATORY_CARE_PROVIDER_SITE_OTHER): Payer: 59 | Admitting: Adult Health

## 2021-07-11 ENCOUNTER — Ambulatory Visit: Payer: 59

## 2021-07-11 ENCOUNTER — Encounter: Payer: Self-pay | Admitting: Adult Health

## 2021-07-11 ENCOUNTER — Other Ambulatory Visit: Payer: Self-pay

## 2021-07-11 VITALS — BP 111/65 | HR 79

## 2021-07-11 DIAGNOSIS — F339 Major depressive disorder, recurrent, unspecified: Secondary | ICD-10-CM

## 2021-07-11 DIAGNOSIS — F329 Major depressive disorder, single episode, unspecified: Secondary | ICD-10-CM | POA: Diagnosis not present

## 2021-07-11 DIAGNOSIS — F410 Panic disorder [episodic paroxysmal anxiety] without agoraphobia: Secondary | ICD-10-CM | POA: Diagnosis not present

## 2021-07-11 DIAGNOSIS — F411 Generalized anxiety disorder: Secondary | ICD-10-CM

## 2021-07-11 DIAGNOSIS — F4310 Post-traumatic stress disorder, unspecified: Secondary | ICD-10-CM | POA: Diagnosis not present

## 2021-07-11 NOTE — Progress Notes (Signed)
[name] [id number] 30-Mar-1966 55 y.o.  Subjective:   Patient ID:  Tracy [name] is a 55 y.o. (DOB 04-15-1966) female.  Chief Complaint: No chief complaint on file.   HPI  Tracy [name] presents to the office today for follow-up of MDD, GAD, PTSD, and Panic attacks, and Treatment resistant depression.   Spravato treatment - 1  Patient observed throughout Spravato treatment. Patient pleasant and in no acute distress. At treatment end patient is alert and oriented without any complaints. Tolerated initiation of Spravato at 17m without any untoward side effects. Patient alert, smiling and communicative at session end. Denies any untoward side effects - feeling disconnected from herself, her thoughts, feelings and things around her, dizziness, nausea, feeling sleepy, decreased feeling of sensitivity (numbness)spinning sensation, feeling anxious, lack of energy, increased blood pressure, nausea, vomiting, feeling drink, or feeling happy or very excited. Stating "I feel alright". Patient rested quietly during session with nurse at bedside monitoring vital signs and mood stability during procedure. Patient alert and oriented at session end - listening to music. Was escorted out of the building and placed in the care of a friend to transport her home. Next treatment scheduled for 07/13/2021.   GBrushyOffice Visit from 03/14/2021 in [place]Office Visit from 02/14/2021 in CK[facility] WestOffice Visit from 03/01/2020 in [place] Total GAD-7 Score 17 12 11       PHQ2-9    FLeonardvilleOffice Visit from 03/14/2021 in CSpar[place]Office Visit from 02/14/2021 in [place] ParkVisit from 10/11/2020 in CE[place]Office Visit from 03/01/2020 in [place] from 06/16/2019 [facility] Total Score 6 6 6 3 2   PHQ-9 Total Score 19 21 19 9 9       Flowsheet Row ED from 02/11/2021 in MWodenED from 02/10/2021 in MTipp[facility]Emergency Dept Admission (Discharged) from 01/10/2021 in [place] [place] SouthNo Risk No Risk No Risk        Review of Systems:  Review of Systems  Musculoskeletal:  Negative for gait problem.  Neurological:  Negative for tremors.  Psychiatric/Behavioral:         Please refer to HPI   Medications: I have reviewed the patient's current medications.  Current Outpatient Medications  Medication Sig Dispense Refill   folic acid (FOLVITE) 1 MG tablet Take 1 tablet (1 mg total) by mouth daily. 30 tablet 0   AMBULATORY NON FORMULARY MEDICATION Convertible/stand-up workstation such as "[employer]." 1 each 0   Blood Glucose Monitoring Suppl (FREESTYLE LITE) w/Device KIT USE TO CHECK BLOOD SUGAR ONCE A DAY 1 kit 0   Blood Glucose Monitoring Suppl (FREESTYLE LITE) w/Device KIT USE TO CHECK BLOOD SUGAR ONCE A DAY 1 kit 0   clonazePAM (KLONOPIN) 1 MG tablet Take 1 tablet (1 mg total) by mouth 3 (three) times daily. 30 tablet 5   Continuous Blood Gluc Receiver (DEXCOM G6 RECEIVER) DEVI Use to monitor continuous blood glucose. 1 each 0   Continuous Blood Gluc Sensor (DEXCOM G6 SENSOR) MISC Replace every 10 days.  Use to check blood sugar. 9 each 1   Continuous Blood Gluc Transmit (DEXCOM G6 TRANSMITTER) MISC Use to  check continuous blood sugar.  Replace every 90 days 1 each 3   COVID-19 mRNA bivalent vaccine, Pfizer, (PFIZER COVID-19 VAC BIVALENT) injection Inject into the muscle. 0.3 mL 0   cyclobenzaprine (FLEXERIL) 10 MG tablet TAKE 1 TABLET BY MOUTH 2 TIMES DAILY AS NEEDED FOR MUSCLE SPASMS 20 tablet 0   Esketamine HCl, 56 MG Dose, (SPRAVATO, 56 MG DOSE,) 28 MG/DEVICE SOPK Administer  56 mg ( 2 of the 28 mg) intranasally as directed. 2 each 0   Esketamine HCl, 84 MG Dose, (SPRAVATO, 84 MG DOSE,) 28 MG/DEVICE SOPK Administer 84 mg ( 3 of the 28 mg) intranasally as directed 3 each 3   famotidine (PEPCID) 40 MG tablet Take 1 tablet by mouth 2  times daily. 180 tablet 1   ferrous sulfate 325 (65 FE) MG EC tablet Take 1 tablet (325 mg total) by mouth in the morning and at bedtime. Take iron supplement 2 hours before or 4 hours after PPI or other antacids as this requires some level of gastric acidity to aid in absorption 60 tablet 5   furosemide (LASIX) 40 MG tablet TAKE 1 TABLET BY MOUTH DAILY. 30 tablet 3   glucose blood (FREESTYLE LITE) test strip Use as instructed 100 each 12   glucose blood test strip USE TO CHECK BLOOD SUGAR ONCE A DAY (Patient taking differently: USE TO CHECK BLOOD SUGAR ONCE A DAY) 100 strip 12   hydrOXYzine (ATARAX/VISTARIL) 25 MG tablet Take 1 tablet (25 mg total) by mouth 3 (three) times daily as needed. (Patient taking differently: Take 25 mg by mouth at bedtime as needed (sleep).) 90 tablet 2   insulin degludec (TRESIBA FLEXTOUCH) 100 UNIT/ML FlexTouch Pen Inject 30 Units into the skin 2 (two) times daily. 54 mL 3   insulin lispro (HUMALOG KWIKPEN) 100 UNIT/ML KwikPen Inject up to 16 units 3 times daily at mealtime per sliding scale. 15 mL 11   Insulin Pen Needle (UNIFINE PENTIPS) 31G X 5 MM MISC Use as directed to inject insulin 100 each 3   Lancets (FREESTYLE) lancets USE TO TEST BLOOD SUGAR ONCE A DAY (Patient taking differently: USE TO TEST BLOOD SUGAR ONCE A DAY) 100 each 0   lidocaine (XYLOCAINE) 5 % ointment APPLY 1 G TOPICALLY 3 (THREE) TIMES DAILY BEFORE MEALS (DOESNT COME IN GEL, FAXED DR) (Patient taking differently: Apply 1 application topically 3 (three) times daily as needed (oral sores).) 1063.2 g 2   lisinopril (ZESTRIL) 10 MG tablet Take 1 tablet (10 mg total) by mouth daily. 90 tablet 3   meloxicam (MOBIC) 15 MG tablet Take one tab by mouth  every morning with a meal for 2 weeks, then daily as needed for pain. 30 tablet 0   NUVIGIL 200 MG TABS Take 1 tablet by mouth daily as needed (sleepiness). 30 tablet 2   nystatin (MYCOSTATIN/NYSTOP) powder Apply 3 times daily as directed. (Patient taking differently: Apply 1 application topically daily.) 60 g 3   ondansetron (ZOFRAN-ODT) 4 MG disintegrating tablet Take 4 mg by mouth 2 (two) times daily as needed for nausea or vomiting.     pimecrolimus (ELIDEL) 1 % cream APPLY TOPICALLY 2 TIMES DAILY TO COMPLETELY DRY AREA PLACE A THIN LAYER ONTO THE MOST SORE AREAS PRIOR TO BED. DO NOT RINSE (Patient taking differently: Apply 1 application topically 2 (two) times daily as needed (mouth sores).) 60 g 0   RABEprazole (ACIPHEX) 20 MG tablet Take 1 tablet (20 mg total) by mouth in the morning and at  bedtime. 180 tablet 1   rizatriptan (MAXALT) 10 MG tablet Take 1 tablet (10 mg total) by mouth as needed for migraine. May repeat in 2 hours if needed (Patient taking differently: Take 10 mg by mouth every 2 (two) hours as needed for migraine. May repeat in 2 hours if needed) 10 tablet 0   rosuvastatin (CRESTOR) 20 MG tablet TAKE 1 TABLET BY MOUTH DAILY. 90 tablet 3   Semaglutide (RYBELSUS) 14 MG TABS Take 1 tablet by mouth daily. 30 tablet 0   sertraline (ZOLOFT) 100 MG tablet TAKE 2 TABLETS BY MOUTH EVERY MORNING 180 tablet 3   spironolactone (ALDACTONE) 50 MG tablet Take 1 tablet (50 mg total) by mouth 2 (two) times daily. 60 tablet 2   sucralfate (CARAFATE) 1 g tablet Take 1 tablet (1 g total) by mouth 4 (four) times daily -  with meals and at bedtime for 15 days. 60 tablet 1   SYNTHROID 300 MCG tablet TAKE 1 TABLET BY MOUTH ONCE A DAY BEFORE BREAKFAST 90 tablet 1   Vitamin D, Ergocalciferol, (DRISDOL) 1.25 MG (50000 UNIT) CAPS capsule TAKE 1 CAPSULE BY MOUTH EVERY 7 DAYS. 12 capsule 1   No current facility-administered medications for this visit.    Medication Side Effects: None  Allergies:   Allergies  Allergen Reactions   Sulfa Antibiotics Itching, Swelling and Rash   Food     Walnut-mouth swelling/itching   Bupropion Hcl Itching and Rash   Codeine Itching   Sulfamethoxazole-Trimethoprim Rash    Past Medical History:  Diagnosis Date   Anxiety    Arthritis    back, knees, right elbow   Back pain    Bilateral swelling of feet and ankles    Bipolar disorder (HCC)    Borderline personality disorder ([place])    Cancer (HCC)    oral   Chewing difficulty    Cirrhosis (HCC)    Constipation    Dental crowns present    Depression    Diabetes mellitus without complication ([place])    Fatty liver    Food allergy    Walnuts   GAD (generalized anxiety disorder)    GERD (gastroesophageal reflux disease)    High cholesterol    no current med.   History of kidney stones    History of migraine headaches    Hypertension    under control with meds., has been on med. x 4 yr.   Hypothyroidism    IBS (irritable bowel syndrome)    Infertility, female    Joint pain    Lichen planus    Liver cirrhosis secondary to NASH (nonalcoholic steatohepatitis) (HCC)    NASH (nonalcoholic steatohepatitis)    Osteoarthritis    Other fatigue    Palpitations    PCOS (polycystic ovarian syndrome)    PONV (postoperative nausea and vomiting)    also hx. of emergence delirium 2007   Shortness of breath on exertion    Sleep apnea    uses CPAP nightly   Trigeminal neuralgia    Type 2 diabetes mellitus (HCC)    Vitamin D deficiency     Past Medical History, Surgical history, Social history, and Family history were reviewed and updated as appropriate.   Please see review of systems for further details on the patient's review from today.   Objective:   Physical Exam:  LMP 08/24/2020   Physical Exam Constitutional:      General: She is not in acute distress. Musculoskeletal:  General: No deformity.  Neurological:     Mental Status: She is alert and oriented to person, place,  and time.     Coordination: Coordination normal.  Psychiatric:        Attention and Perception: Attention and perception normal. She does not perceive auditory or visual hallucinations.        Mood and Affect: Mood normal. Mood is not anxious or depressed. Affect is not labile, blunt, angry or inappropriate.        Speech: Speech normal.        Behavior: Behavior normal.        Thought Content: Thought content normal. Thought content is not paranoid or delusional. Thought content does not include homicidal or suicidal ideation. Thought content does not include homicidal or suicidal plan.        Cognition and Memory: Cognition and memory normal.        Judgment: Judgment normal.     Comments: Insight intact    Lab Review:     Component Value Date/Time   NA 133 (L) 03/22/2021 0000   NA 140 10/11/2020 1041   K 4.3 03/22/2021 0000   CL 98 03/22/2021 0000   CO2 23 03/22/2021 0000   GLUCOSE 342 (H) 03/22/2021 0000   BUN 11 03/22/2021 0000   BUN 14 10/11/2020 1041   CREATININE 0.85 03/22/2021 0000   CALCIUM 9.7 03/22/2021 0000   PROT 7.0 02/11/2021 1516   PROT 7.1 10/11/2020 1041   ALBUMIN 3.8 02/11/2021 1516   ALBUMIN 4.2 10/11/2020 1041   AST 38 02/11/2021 1516   ALT 35 02/11/2021 1516   ALKPHOS 172 (H) 02/11/2021 1516   BILITOT 0.1 (L) 02/11/2021 1516   BILITOT <0.2 10/11/2020 1041   GFRNONAA >60 02/11/2021 1516   GFRNONAA 99 12/26/2020 0000   GFRAA 115 12/26/2020 0000       Component Value Date/Time   WBC 4.4 02/14/2021 0000   RBC 4.67 02/14/2021 0000   HGB 11.9 02/14/2021 0000   HGB 10.9 (L) 10/11/2020 1041   HCT 38.4 02/14/2021 0000   HCT 35.6 10/11/2020 1041   PLT 171 02/14/2021 0000   PLT 224 10/11/2020 1041   MCV 82.2 02/14/2021 0000   MCV 78 (L) 10/11/2020 1041   MCH 25.5 (L) 02/14/2021 0000   MCHC 31.0 (L) 02/14/2021 0000   RDW 20.0 (H) 02/14/2021 0000   RDW 17.5 (H) 10/11/2020 1041   LYMPHSABS 1,448 02/14/2021 0000   LYMPHSABS 1.7 10/11/2020 1041    MONOABS 0.3 02/11/2021 1516   EOSABS 141 02/14/2021 0000   EOSABS 0.2 10/11/2020 1041   BASOSABS 22 02/14/2021 0000   BASOSABS 0.0 10/11/2020 1041    No results found for: POCLITH, LITHIUM   No results found for: PHENYTOIN, PHENOBARB, VALPROATE, CBMZ   .res Assessment: Plan:    Plan:  Continue Spravato treatment - next dose 65m.  There are no diagnoses linked to this encounter.   Please see After Visit Summary for patient specific instructions.  Future Appointments  Date Time Provider DSt. Anthony 07/13/2021  9:00 AM CP-NURSE CP-CP None  07/13/2021  9:00 AM Tyjay Galindo, RBerdie Ogren NP CP-CP None  07/19/2021  2:00 PM BDennard NipD, MD MWM-MWM None  08/02/2021  8:50 AM MLuetta Nutting DO PCK-PCK None  08/15/2021  4:00 PM Revankar, RReita Cliche MD CVD-HIGHPT None    No orders of the defined types were placed in this encounter.   -------------------------------

## 2021-07-12 ENCOUNTER — Other Ambulatory Visit: Payer: Self-pay | Admitting: Adult Health

## 2021-07-12 DIAGNOSIS — F339 Major depressive disorder, recurrent, unspecified: Secondary | ICD-10-CM

## 2021-07-12 NOTE — Progress Notes (Signed)
Nurse visit:  Patient arrived for her 1st Spravato treatment, she was directed to the [facility] Treatment Room. Pt is being treated for Treatment Resistant Depression, her starting dose will be 56 mg (2 of the 28 mg) nasal sprays. Patient arrived and taken to treatment room, explained and discussed things about her treatment and how the schedule should go, along with any side effects that may occur.Let patient use the trainer nasal sprays from Spravato for her to get the feel of it and how it clicks. She verbalized understanding. She reports watching some YouTube videos about Spravato. Pt very excited to get started, due to the wait of it. I had previously contacted pt a day ahead to confirm and answer any questions she may had. Confirmed she had a ride home. Pt's Spravato is with [facility] and delivered to office, all Spravato medication is stored at doctors office per REMS/FDA guidelines. Required to be locked behind two doors.   Began taking patient's vital signs at 9:15 am 110/72, pulse 84. Instructed patient to blow her nose then recline back to a 45 degree angle. Gave patient first dose 28 mg nasal spray, patient did well, administered in each nostril as directed and waited 5 more minutes for her second dose, which she did the same. After both doses given pt did not complain of any nausea/vomiting, she did drink something because of the taste the nasal sprays give you. It wasn't pleasant to her. Patient brought music to listen to but didn't have ear pods, informed her that was okay because it was just me monitoring her. Pt asked that the lights be turned off completely and close the door.  Patient doing well, she was singing along to her music.  Checked 40 minute vitals at 9:55 AM, 112/68, pulse 77. Patient appeared to be feeling very good. No complaints. Explained she would be monitored for a total time of 120 minutes. Lights were again shut off, I could hear pt on the phone twice but not clear on  who she was speaking to. Discharge vitals were taken at 11:10 AM 111/65, pulse 79. Pt appeared still in her happy stage, I asked if she could walk and she reports yes. Still no nausea/vomiting, dissociation, or increased b/p. Nursing staff normally walks the patient down for the first few visits, to make sure they are safely put in their riders car and that she made it down safely. I did escort her to her friend's car and assisted in the car. Recommend she go home and sleep or just relax on the couch. No driving, no intense activities. She agreed. Pt will be receiving 2 treatments per week for 4 weeks as recommended. Then reassessed to go down to 1 treatment weekly. Nurse was with pt a total of 55 minutes for clinical assessment. Pt is scheduled on Friday, December 9 th. Pt instructed to call office with any problems or questions.    EXP 2024-FEB LOT [id number]

## 2021-07-13 ENCOUNTER — Other Ambulatory Visit: Payer: Self-pay

## 2021-07-13 ENCOUNTER — Other Ambulatory Visit (HOSPITAL_COMMUNITY): Payer: Self-pay

## 2021-07-13 ENCOUNTER — Ambulatory Visit (INDEPENDENT_AMBULATORY_CARE_PROVIDER_SITE_OTHER): Payer: 59 | Admitting: Adult Health

## 2021-07-13 ENCOUNTER — Ambulatory Visit: Payer: 59

## 2021-07-13 ENCOUNTER — Encounter: Payer: Self-pay | Admitting: Adult Health

## 2021-07-13 VITALS — BP 120/68 | HR 77

## 2021-07-13 DIAGNOSIS — F339 Major depressive disorder, recurrent, unspecified: Secondary | ICD-10-CM | POA: Diagnosis not present

## 2021-07-13 DIAGNOSIS — F410 Panic disorder [episodic paroxysmal anxiety] without agoraphobia: Secondary | ICD-10-CM

## 2021-07-13 DIAGNOSIS — F4310 Post-traumatic stress disorder, unspecified: Secondary | ICD-10-CM | POA: Diagnosis not present

## 2021-07-13 DIAGNOSIS — F411 Generalized anxiety disorder: Secondary | ICD-10-CM

## 2021-07-13 DIAGNOSIS — F329 Major depressive disorder, single episode, unspecified: Secondary | ICD-10-CM

## 2021-07-13 DIAGNOSIS — F331 Major depressive disorder, recurrent, moderate: Secondary | ICD-10-CM

## 2021-07-13 NOTE — Telephone Encounter (Signed)
Go ahead and send. I didn't send refills for 84 mg so they probably need it to mail more out

## 2021-07-13 NOTE — Telephone Encounter (Signed)
Get with Tracy [name] on it.

## 2021-07-13 NOTE — Progress Notes (Signed)
TONJI ELLIFF [id number] 1966-06-18 55 y.o.  Subjective:   Patient ID:  Tracy [name] is a 55 y.o. (DOB February 22, 1966) female.  Chief Complaint: No chief complaint on file.   HPI ALIE MOUDY presents to the office today for follow-up of MDD, GAD, PTSD, and Panic attacks, and Treatment resistant depression.   Spravato treatment - 2  Patient was administered Spravato 56 mg intranasally today. The patient experienced the typical dissociation which gradually resolved over the 2-hour period of observation. There were no complications. Patient observed throughout Spravato treatment. At treatment end patient is alert and oriented without any complaints. Patient alert, smiling and communicative. Denies any untoward side effects - feeling disconnected from herself, her thoughts, feelings and things around her, dizziness, nausea, feeling sleepy, decreased feeling of sensitivity (numbness)spinning sensation, feeling anxious, lack of energy, increased blood pressure, nausea, vomiting, feeling drink, or feeling happy or very excited. Stating "I feel ok". Patient rested quietly during session with nurse at bedside monitoring vital signs and mood stability during procedure. By the time the 2-hour observation period was met the patient was alert and oriented and able to exit without assistance.  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       [place] Office Visit from 03/14/2021 in [place] Office Visit from 02/14/2021 in [place] Office Visit from 03/01/2020 in [place]  Total GAD-7 Score 17 12 11       PHQ2-9    [place] Office Visit from 03/14/2021 in [place] Office Visit from 02/14/2021 in [place] Visit from 10/11/2020 in [place] Office Visit from 03/01/2020 in [place] Visit from 06/16/2019 in [place] [facility]  PHQ-2 Total Score 6 6 6 3 2   PHQ-9 Total Score 19 21 19 9 9       Flowsheet Row ED from 02/11/2021 in [place] [facility] from 02/10/2021 in [facility] Admission (Discharged) from 01/10/2021 in [place] No Risk No Risk No Risk        Review of Systems:  Review of Systems  Musculoskeletal:  Negative for gait problem.  Neurological:  Negative for tremors.  Psychiatric/Behavioral:         Please refer to HPI   Medications: I have reviewed the patient's current medications.  Current Outpatient Medications  Medication Sig Dispense Refill   folic acid (FOLVITE) 1 MG tablet Take 1 tablet (1 mg total) by mouth daily. 30 tablet 0   AMBULATORY NON FORMULARY MEDICATION Convertible/stand-up workstation such as "[employer]." 1 each 0   Blood Glucose Monitoring Suppl (FREESTYLE LITE) w/Device KIT USE TO CHECK BLOOD SUGAR ONCE A DAY 1 kit 0   Blood Glucose Monitoring Suppl (FREESTYLE LITE) w/Device KIT USE TO CHECK BLOOD SUGAR ONCE A DAY 1 kit 0   clonazePAM (KLONOPIN) 1 MG tablet Take 1 tablet (1 mg total) by mouth 3 (three) times daily. 30 tablet 5   Continuous Blood Gluc Receiver (DEXCOM G6 RECEIVER) DEVI Use to monitor continuous blood glucose. 1 each 0   Continuous Blood Gluc Sensor (DEXCOM G6 SENSOR) MISC Replace every 10 days.  Use to check blood  sugar. 9 each 1   Continuous Blood Gluc Transmit (DEXCOM G6 TRANSMITTER) MISC Use to check continuous blood sugar.  Replace every 90 days 1 each 3   COVID-19 mRNA bivalent vaccine, Pfizer, (PFIZER COVID-19 VAC BIVALENT) injection Inject into the muscle. 0.3 mL 0   cyclobenzaprine (FLEXERIL) 10 MG tablet TAKE 1 TABLET BY MOUTH 2  TIMES DAILY AS NEEDED FOR MUSCLE SPASMS 20 tablet 0   Esketamine HCl, 56 MG Dose, (SPRAVATO, 56 MG DOSE,) 28 MG/DEVICE SOPK Administer 56 mg ( 2 of the 28 mg) intranasally as directed. 2 each 0   Esketamine HCl, 84 MG Dose, (SPRAVATO, 84 MG DOSE,) 28 MG/DEVICE SOPK Administer 77m intranasally twice weekly. To be administered under the supervision of a healthcare professional. 9 each 0   famotidine (PEPCID) 40 MG tablet Take 1 tablet by mouth 2  times daily. 180 tablet 1   ferrous sulfate 325 (65 FE) MG EC tablet Take 1 tablet (325 mg total) by mouth in the morning and at bedtime. Take iron supplement 2 hours before or 4 hours after PPI or other antacids as this requires some level of gastric acidity to aid in absorption 60 tablet 5   furosemide (LASIX) 40 MG tablet TAKE 1 TABLET BY MOUTH DAILY. 30 tablet 3   glucose blood (FREESTYLE LITE) test strip Use as instructed 100 each 12   glucose blood test strip USE TO CHECK BLOOD SUGAR ONCE A DAY (Patient taking differently: USE TO CHECK BLOOD SUGAR ONCE A DAY) 100 strip 12   hydrOXYzine (ATARAX/VISTARIL) 25 MG tablet Take 1 tablet (25 mg total) by mouth 3 (three) times daily as needed. (Patient taking differently: Take 25 mg by mouth at bedtime as needed (sleep).) 90 tablet 2   insulin degludec (TRESIBA FLEXTOUCH) 100 UNIT/ML FlexTouch Pen Inject 30 Units into the skin 2 times daily. 54 mL 3   insulin lispro (HUMALOG KWIKPEN) 100 UNIT/ML KwikPen Inject up to 16 units 3 times daily at mealtime per sliding scale. 15 mL 11   Insulin Pen Needle (UNIFINE PENTIPS) 31G X 5 MM MISC Use as directed to inject insulin 100 each 3   Lancets (FREESTYLE) lancets USE TO TEST BLOOD SUGAR ONCE A DAY (Patient taking differently: USE TO TEST BLOOD SUGAR ONCE A DAY) 100 each 0   lidocaine (XYLOCAINE) 5 % ointment APPLY 1 G TOPICALLY 3 (THREE) TIMES DAILY BEFORE MEALS (DOESNT COME IN GEL, FAXED DR) (Patient taking differently: Apply 1 application topically 3 (three) times daily  as needed (oral sores).) 1063.2 g 2   lisinopril (ZESTRIL) 10 MG tablet Take 1 tablet (10 mg total) by mouth daily. 90 tablet 3   meloxicam (MOBIC) 15 MG tablet Take one tab by mouth every morning with a meal for 2 weeks, then daily as needed for pain. 30 tablet 0   NUVIGIL 200 MG TABS Take 1 tablet by mouth daily as needed (sleepiness). 30 tablet 2   nystatin (MYCOSTATIN/NYSTOP) powder Apply 3 times daily as directed. (Patient taking differently: Apply 1 application topically daily.) 60 g 3   ondansetron (ZOFRAN-ODT) 4 MG disintegrating tablet Take 4 mg by mouth 2 (two) times daily as needed for nausea or vomiting.     pimecrolimus (ELIDEL) 1 % cream APPLY TOPICALLY 2 TIMES DAILY TO COMPLETELY DRY AREA PLACE A THIN LAYER ONTO THE MOST SORE AREAS PRIOR TO BED. DO NOT RINSE (Patient taking differently: Apply 1 application topically 2 (two) times daily as needed (mouth sores).) 60 g 0  RABEprazole (ACIPHEX) 20 MG tablet Take 1 tablet (20 mg total) by mouth in the morning and at bedtime. 180 tablet 1   rizatriptan (MAXALT) 10 MG tablet Take 1 tablet (10 mg total) by mouth as needed for migraine. May repeat in 2 hours if needed (Patient taking differently: Take 10 mg by mouth every 2 (two) hours as needed for migraine. May repeat in 2 hours if needed) 10 tablet 0   rosuvastatin (CRESTOR) 20 MG tablet TAKE 1 TABLET BY MOUTH DAILY. 90 tablet 3   Semaglutide (RYBELSUS) 14 MG TABS Take 1 tablet by mouth daily. 30 tablet 0   sertraline (ZOLOFT) 100 MG tablet TAKE 2 TABLETS BY MOUTH EVERY MORNING 180 tablet 3   spironolactone (ALDACTONE) 50 MG tablet Take 1 tablet (50 mg total) by mouth 2 (two) times daily. 60 tablet 2   sucralfate (CARAFATE) 1 g tablet Take 1 tablet (1 g total) by mouth 4 (four) times daily -  with meals and at bedtime for 15 days. 60 tablet 1   SYNTHROID 300 MCG tablet TAKE 1 TABLET BY MOUTH ONCE A DAY BEFORE BREAKFAST 90 tablet 1   Vitamin D, Ergocalciferol, (DRISDOL) 1.25 MG (50000 UNIT)  CAPS capsule TAKE 1 CAPSULE BY MOUTH EVERY 7 DAYS. 12 capsule 1   No current facility-administered medications for this visit.    Medication Side Effects: None  Allergies:  Allergies  Allergen Reactions   Sulfa Antibiotics Itching, Swelling and Rash   Food     Walnut-mouth swelling/itching   Bupropion Hcl Itching and Rash   Codeine Itching   Sulfamethoxazole-Trimethoprim Rash    Past Medical History:  Diagnosis Date   Anxiety    Arthritis    back, knees, right elbow   Back pain    Bilateral swelling of feet and ankles    Bipolar disorder (HCC)    Borderline personality disorder ([place])    Cancer (HCC)    oral   Chewing difficulty    Cirrhosis (HCC)    Constipation    Dental crowns present    Depression    Diabetes mellitus without complication ([place])    Fatty liver    Food allergy    Walnuts   GAD (generalized anxiety disorder)    GERD (gastroesophageal reflux disease)    High cholesterol    no current med.   History of kidney stones    History of migraine headaches    Hypertension    under control with meds., has been on med. x 4 yr.   Hypothyroidism    IBS (irritable bowel syndrome)    Infertility, female    Joint pain    Lichen planus    Liver cirrhosis secondary to NASH (nonalcoholic steatohepatitis) (HCC)    NASH (nonalcoholic steatohepatitis)    Osteoarthritis    Other fatigue    Palpitations    PCOS (polycystic ovarian syndrome)    PONV (postoperative nausea and vomiting)    also hx. of emergence delirium 2007   Shortness of breath on exertion    Sleep apnea    uses CPAP nightly   Trigeminal neuralgia    Type 2 diabetes mellitus (HCC)    Vitamin D deficiency     Past Medical History, Surgical history, Social history, and Family history were reviewed and updated as appropriate.   Please see review of systems for further details on the patient's review from today.   Objective:   Physical Exam:  LMP 08/24/2020   Physical  Exam Constitutional:  General: She is not in acute distress. Musculoskeletal:        General: No deformity.  Neurological:     Mental Status: She is alert and oriented to person, place, and time.     Coordination: Coordination normal.  Psychiatric:        Attention and Perception: Attention and perception normal. She does not perceive auditory or visual hallucinations.        Mood and Affect: Mood normal. Mood is not anxious or depressed. Affect is not labile, blunt, angry or inappropriate.        Speech: Speech normal.        Behavior: Behavior normal.        Thought Content: Thought content normal. Thought content is not paranoid or delusional. Thought content does not include homicidal or suicidal ideation. Thought content does not include homicidal or suicidal plan.        Cognition and Memory: Cognition and memory normal.        Judgment: Judgment normal.     Comments: Insight intact    Lab Review:     Component Value Date/Time   NA 133 (L) 03/22/2021 0000   NA 140 10/11/2020 1041   K 4.3 03/22/2021 0000   CL 98 03/22/2021 0000   CO2 23 03/22/2021 0000   GLUCOSE 342 (H) 03/22/2021 0000   BUN 11 03/22/2021 0000   BUN 14 10/11/2020 1041   CREATININE 0.85 03/22/2021 0000   CALCIUM 9.7 03/22/2021 0000   PROT 7.0 02/11/2021 1516   PROT 7.1 10/11/2020 1041   ALBUMIN 3.8 02/11/2021 1516   ALBUMIN 4.2 10/11/2020 1041   AST 38 02/11/2021 1516   ALT 35 02/11/2021 1516   ALKPHOS 172 (H) 02/11/2021 1516   BILITOT 0.1 (L) 02/11/2021 1516   BILITOT <0.2 10/11/2020 1041   GFRNONAA >60 02/11/2021 1516   GFRNONAA 99 12/26/2020 0000   GFRAA 115 12/26/2020 0000       Component Value Date/Time   WBC 4.4 02/14/2021 0000   RBC 4.67 02/14/2021 0000   HGB 11.9 02/14/2021 0000   HGB 10.9 (L) 10/11/2020 1041   HCT 38.4 02/14/2021 0000   HCT 35.6 10/11/2020 1041   PLT 171 02/14/2021 0000   PLT 224 10/11/2020 1041   MCV 82.2 02/14/2021 0000   MCV 78 (L) 10/11/2020 1041   MCH  25.5 (L) 02/14/2021 0000   MCHC 31.0 (L) 02/14/2021 0000   RDW 20.0 (H) 02/14/2021 0000   RDW 17.5 (H) 10/11/2020 1041   LYMPHSABS 1,448 02/14/2021 0000   LYMPHSABS 1.7 10/11/2020 1041   MONOABS 0.3 02/11/2021 1516   EOSABS 141 02/14/2021 0000   EOSABS 0.2 10/11/2020 1041   BASOSABS 22 02/14/2021 0000   BASOSABS 0.0 10/11/2020 1041    No results found for: POCLITH, LITHIUM   No results found for: PHENYTOIN, PHENOBARB, VALPROATE, CBMZ   .res Assessment: Plan:    Diagnoses and all orders for this visit:  GAD (generalized anxiety disorder)  Panic attacks  PTSD (post-traumatic stress disorder)  Recurrent major depression resistant to treatment [facility])    Please see After Visit Summary for patient specific instructions.  Future Appointments  Date Time Provider [place]  07/19/2021  2:00 PM Starlyn Skeans, MD [facility] None  08/02/2021  8:50 AM Luetta Nutting, DO [facility] None  08/15/2021  4:00 PM Revankar, Reita Cliche, MD [facility] None    No orders of the defined types were placed in this encounter.   -------------------------------

## 2021-07-13 NOTE — Telephone Encounter (Signed)
Not sure if this should be sent or not

## 2021-07-13 NOTE — Telephone Encounter (Signed)
Please review

## 2021-07-18 ENCOUNTER — Ambulatory Visit (INDEPENDENT_AMBULATORY_CARE_PROVIDER_SITE_OTHER): Payer: 59 | Admitting: Adult Health

## 2021-07-18 ENCOUNTER — Ambulatory Visit (INDEPENDENT_AMBULATORY_CARE_PROVIDER_SITE_OTHER): Payer: 59 | Admitting: Family Medicine

## 2021-07-18 ENCOUNTER — Other Ambulatory Visit: Payer: Self-pay

## 2021-07-18 ENCOUNTER — Encounter: Payer: Self-pay | Admitting: Adult Health

## 2021-07-18 ENCOUNTER — Ambulatory Visit: Payer: 59

## 2021-07-18 VITALS — BP 112/72 | HR 74

## 2021-07-18 DIAGNOSIS — F339 Major depressive disorder, recurrent, unspecified: Secondary | ICD-10-CM | POA: Diagnosis not present

## 2021-07-18 DIAGNOSIS — F4310 Post-traumatic stress disorder, unspecified: Secondary | ICD-10-CM | POA: Diagnosis not present

## 2021-07-18 DIAGNOSIS — F411 Generalized anxiety disorder: Secondary | ICD-10-CM

## 2021-07-18 DIAGNOSIS — F410 Panic disorder [episodic paroxysmal anxiety] without agoraphobia: Secondary | ICD-10-CM | POA: Diagnosis not present

## 2021-07-18 DIAGNOSIS — F329 Major depressive disorder, single episode, unspecified: Secondary | ICD-10-CM | POA: Diagnosis not present

## 2021-07-18 DIAGNOSIS — F331 Major depressive disorder, recurrent, moderate: Secondary | ICD-10-CM

## 2021-07-18 NOTE — Progress Notes (Signed)
Tracy [name] [id number] 15-Mar-1966 55 y.o.  Subjective:   Patient ID:  Tracy [name] is a 55 y.o. (DOB [birth year]) female.  Chief Complaint: No chief complaint on file.   HPI Tracy [name] presents to the office today for Spavato treatment - 3.   Patient was administered Spravato 84 mg intranasally today. She was observed by provider throughout [facility] treatment. The patient experienced the typical dissociation which gradually resolved over the 2-hour period of observation. There were no complications. Specifically , the patient did not have  any untoward side effects - feeling disconnected from herself, her thoughts, feelings and things around her, dizziness, nausea, feeling sleepy, decreased feeling of sensitivity (numbness)spinning sensation, feeling anxious, lack of energy, increased blood pressure, nausea, vomiting, feeling drink, or feeling happy or very excited.nausea or vomiting or headache. Blood pressures remained within normal ranges at the 40-minute and 2-hour follow-up intervals. By the time the 2-hour observation period was met the patient was alert and oriented and able to exit without assistance. 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 See nursing note for further details.   [place] Office Visit from 03/14/2021 in [place] Office Visit from 02/14/2021 in [place] Office Visit from 03/01/2020 in [place]  Total GAD-7 Score 17 12 11       PHQ2-9    [place] Office Visit from 03/14/2021 in [place] Office Visit from 02/14/2021 in [place] Visit from 10/11/2020 in [place] Office Visit from 03/01/2020 in [place] Visit from 06/16/2019 in [place] at [facility]  PHQ-2 Total Score 6 6 6 3 2   PHQ-9 Total Score 19 21 19 9 9       Flowsheet Row ED from 02/11/2021 in [place] [facility] from 02/10/2021 in [place] [facility] Admission (Discharged) from 01/10/2021 in [place] No Risk No Risk No Risk        Review of Systems:  Review of Systems  Musculoskeletal:  Negative for gait problem.  Neurological:  Negative for tremors.  Psychiatric/Behavioral:         Please refer to HPI   Medications: I have reviewed the patient's current medications.  Current Outpatient Medications  Medication Sig Dispense Refill   folic acid (FOLVITE) 1 MG tablet Take 1 tablet (1 mg total) by mouth daily. 30 tablet 0   AMBULATORY NON FORMULARY MEDICATION Convertible/stand-up workstation such as "[employer]." 1 each 0   Blood Glucose Monitoring Suppl (FREESTYLE LITE) w/Device KIT USE TO CHECK BLOOD SUGAR ONCE A DAY 1 kit 0   Blood Glucose Monitoring Suppl (FREESTYLE LITE) w/Device KIT USE TO CHECK BLOOD SUGAR ONCE A DAY 1 kit 0   clonazePAM (KLONOPIN) 1 MG tablet Take 1 tablet (1 mg total) by mouth 3 (three) times daily. 30 tablet 5   Continuous Blood Gluc Receiver (DEXCOM G6 RECEIVER) DEVI Use to monitor continuous blood glucose. 1 each 0   Continuous Blood Gluc Sensor (DEXCOM G6 SENSOR) MISC Replace every 10 days.  Use to check blood sugar. 9 each 1   Continuous Blood Gluc Transmit (DEXCOM G6 TRANSMITTER) MISC Use to check continuous blood sugar.  Replace  every 90 days 1 each 3   COVID-19 mRNA bivalent vaccine, Pfizer, (PFIZER COVID-19 VAC BIVALENT) injection Inject into the muscle. 0.3 mL 0   cyclobenzaprine (FLEXERIL) 10 MG tablet TAKE 1 TABLET BY MOUTH 2 TIMES DAILY AS NEEDED FOR MUSCLE SPASMS 20 tablet 0   Esketamine HCl, 56 MG Dose, (SPRAVATO, 56 MG DOSE,) 28 MG/DEVICE SOPK Administer 56 mg  ( 2 of the 28 mg) intranasally as directed. 2 each 0   Esketamine HCl, 84 MG Dose, (SPRAVATO, 84 MG DOSE,) 28 MG/DEVICE SOPK Administer 25m intranasally twice weekly. To be administered under the supervision of a healthcare professional. 9 each 0   famotidine (PEPCID) 40 MG tablet Take 1 tablet by mouth 2  times daily. 180 tablet 1   ferrous sulfate 325 (65 FE) MG EC tablet Take 1 tablet (325 mg total) by mouth in the morning and at bedtime. Take iron supplement 2 hours before or 4 hours after PPI or other antacids as this requires some level of gastric acidity to aid in absorption 60 tablet 5   furosemide (LASIX) 40 MG tablet TAKE 1 TABLET BY MOUTH DAILY. 30 tablet 3   glucose blood (FREESTYLE LITE) test strip Use as instructed 100 each 12   glucose blood test strip USE TO CHECK BLOOD SUGAR ONCE A DAY (Patient taking differently: USE TO CHECK BLOOD SUGAR ONCE A DAY) 100 strip 12   hydrOXYzine (ATARAX/VISTARIL) 25 MG tablet Take 1 tablet (25 mg total) by mouth 3 (three) times daily as needed. (Patient taking differently: Take 25 mg by mouth at bedtime as needed (sleep).) 90 tablet 2   insulin degludec (TRESIBA FLEXTOUCH) 100 UNIT/ML FlexTouch Pen Inject 30 Units into the skin 2 times daily. 54 mL 3   insulin lispro (HUMALOG KWIKPEN) 100 UNIT/ML KwikPen Inject up to 16 units 3 times daily at mealtime per sliding scale. 15 mL 11   Insulin Pen Needle (UNIFINE PENTIPS) 31G X 5 MM MISC Use as directed to inject insulin 100 each 3   Lancets (FREESTYLE) lancets USE TO TEST BLOOD SUGAR ONCE A DAY (Patient taking differently: USE TO TEST BLOOD SUGAR ONCE A DAY) 100 each 0   lidocaine (XYLOCAINE) 5 % ointment APPLY 1 G TOPICALLY 3 (THREE) TIMES DAILY BEFORE MEALS (DOESNT COME IN GEL, FAXED DR) (Patient taking differently: Apply 1 application topically 3 (three) times daily as needed (oral sores).) 1063.2 g 2   lisinopril (ZESTRIL) 10 MG tablet Take 1 tablet (10 mg total) by mouth daily. 90 tablet 3   meloxicam  (MOBIC) 15 MG tablet Take one tab by mouth every morning with a meal for 2 weeks, then daily as needed for pain. 30 tablet 0   NUVIGIL 200 MG TABS Take 1 tablet by mouth daily as needed (sleepiness). 30 tablet 2   nystatin (MYCOSTATIN/NYSTOP) powder Apply 3 times daily as directed. (Patient taking differently: Apply 1 application topically daily.) 60 g 3   ondansetron (ZOFRAN-ODT) 4 MG disintegrating tablet Take 4 mg by mouth 2 (two) times daily as needed for nausea or vomiting.     pimecrolimus (ELIDEL) 1 % cream APPLY TOPICALLY 2 TIMES DAILY TO COMPLETELY DRY AREA PLACE A THIN LAYER ONTO THE MOST SORE AREAS PRIOR TO BED. DO NOT RINSE (Patient taking differently: Apply 1 application topically 2 (two) times daily as needed (mouth sores).) 60 g 0   RABEprazole (ACIPHEX) 20 MG tablet Take 1 tablet (20 mg total) by mouth in the morning and at bedtime. 180 tablet 1  rizatriptan (MAXALT) 10 MG tablet Take 1 tablet (10 mg total) by mouth as needed for migraine. May repeat in 2 hours if needed (Patient taking differently: Take 10 mg by mouth every 2 (two) hours as needed for migraine. May repeat in 2 hours if needed) 10 tablet 0   rosuvastatin (CRESTOR) 20 MG tablet TAKE 1 TABLET BY MOUTH DAILY. 90 tablet 3   Semaglutide (RYBELSUS) 14 MG TABS Take 1 tablet by mouth daily. 30 tablet 0   sertraline (ZOLOFT) 100 MG tablet TAKE 2 TABLETS BY MOUTH EVERY MORNING 180 tablet 3   spironolactone (ALDACTONE) 50 MG tablet Take 1 tablet (50 mg total) by mouth 2 (two) times daily. 60 tablet 2   sucralfate (CARAFATE) 1 g tablet Take 1 tablet (1 g total) by mouth 4 (four) times daily -  with meals and at bedtime for 15 days. 60 tablet 1   SYNTHROID 300 MCG tablet TAKE 1 TABLET BY MOUTH ONCE A DAY BEFORE BREAKFAST 90 tablet 1   Vitamin D, Ergocalciferol, (DRISDOL) 1.25 MG (50000 UNIT) CAPS capsule TAKE 1 CAPSULE BY MOUTH EVERY 7 DAYS. 12 capsule 1   No current facility-administered medications for this visit.     Medication Side Effects: None  Allergies:  Allergies  Allergen Reactions   Sulfa Antibiotics Itching, Swelling and Rash   Food     Walnut-mouth swelling/itching   Bupropion Hcl Itching and Rash   Codeine Itching   Sulfamethoxazole-Trimethoprim Rash    Past Medical History:  Diagnosis Date   Anxiety    Arthritis    back, knees, right elbow   Back pain    Bilateral swelling of feet and ankles    Bipolar disorder (HCC)    Borderline personality disorder ([place])    Cancer (HCC)    oral   Chewing difficulty    Cirrhosis (HCC)    Constipation    Dental crowns present    Depression    Diabetes mellitus without complication ([place])    Fatty liver    Food allergy    Walnuts   GAD (generalized anxiety disorder)    GERD (gastroesophageal reflux disease)    High cholesterol    no current med.   History of kidney stones    History of migraine headaches    Hypertension    under control with meds., has been on med. x 4 yr.   Hypothyroidism    IBS (irritable bowel syndrome)    Infertility, female    Joint pain    Lichen planus    Liver cirrhosis secondary to NASH (nonalcoholic steatohepatitis) (HCC)    NASH (nonalcoholic steatohepatitis)    Osteoarthritis    Other fatigue    Palpitations    PCOS (polycystic ovarian syndrome)    PONV (postoperative nausea and vomiting)    also hx. of emergence delirium 2007   Shortness of breath on exertion    Sleep apnea    uses CPAP nightly   Trigeminal neuralgia    Type 2 diabetes mellitus (HCC)    Vitamin D deficiency     Past Medical History, Surgical history, Social history, and Family history were reviewed and updated as appropriate.   Please see review of systems for further details on the patient's review from today.   Objective:   Physical Exam:  LMP 08/24/2020   Physical Exam Constitutional:      General: She is not in acute distress. Musculoskeletal:        General: No deformity.  Neurological:  Mental  Status: She is alert and oriented to person, place, and time.     Coordination: Coordination normal.  Psychiatric:        Attention and Perception: Attention and perception normal. She does not perceive auditory or visual hallucinations.        Mood and Affect: Mood normal. Mood is not anxious or depressed. Affect is not labile, blunt, angry or inappropriate.        Speech: Speech normal.        Behavior: Behavior normal.        Thought Content: Thought content normal. Thought content is not paranoid or delusional. Thought content does not include homicidal or suicidal ideation. Thought content does not include homicidal or suicidal plan.        Cognition and Memory: Cognition and memory normal.        Judgment: Judgment normal.     Comments: Insight intact    Lab Review:     Component Value Date/Time   NA 133 (L) 03/22/2021 0000   NA 140 10/11/2020 1041   K 4.3 03/22/2021 0000   CL 98 03/22/2021 0000   CO2 23 03/22/2021 0000   GLUCOSE 342 (H) 03/22/2021 0000   BUN 11 03/22/2021 0000   BUN 14 10/11/2020 1041   CREATININE 0.85 03/22/2021 0000   CALCIUM 9.7 03/22/2021 0000   PROT 7.0 02/11/2021 1516   PROT 7.1 10/11/2020 1041   ALBUMIN 3.8 02/11/2021 1516   ALBUMIN 4.2 10/11/2020 1041   AST 38 02/11/2021 1516   ALT 35 02/11/2021 1516   ALKPHOS 172 (H) 02/11/2021 1516   BILITOT 0.1 (L) 02/11/2021 1516   BILITOT <0.2 10/11/2020 1041   GFRNONAA >60 02/11/2021 1516   GFRNONAA 99 12/26/2020 0000   GFRAA 115 12/26/2020 0000       Component Value Date/Time   WBC 4.4 02/14/2021 0000   RBC 4.67 02/14/2021 0000   HGB 11.9 02/14/2021 0000   HGB 10.9 (L) 10/11/2020 1041   HCT 38.4 02/14/2021 0000   HCT 35.6 10/11/2020 1041   PLT 171 02/14/2021 0000   PLT 224 10/11/2020 1041   MCV 82.2 02/14/2021 0000   MCV 78 (L) 10/11/2020 1041   MCH 25.5 (L) 02/14/2021 0000   MCHC 31.0 (L) 02/14/2021 0000   RDW 20.0 (H) 02/14/2021 0000   RDW 17.5 (H) 10/11/2020 1041   LYMPHSABS 1,448  02/14/2021 0000   LYMPHSABS 1.7 10/11/2020 1041   MONOABS 0.3 02/11/2021 1516   EOSABS 141 02/14/2021 0000   EOSABS 0.2 10/11/2020 1041   BASOSABS 22 02/14/2021 0000   BASOSABS 0.0 10/11/2020 1041    No results found for: POCLITH, LITHIUM   No results found for: PHENYTOIN, PHENOBARB, VALPROATE, CBMZ   .res Assessment: Plan:    Tolerated 3rd Spravato treatment 37m- return in 2 days.  There are no diagnoses linked to this encounter.   Please see After Visit Summary for patient specific instructions.  Future Appointments  Date Time Provider DPo[place]ott 07/19/2021  2:00 PM BStarlyn Skeans MD MWM-MWM None  07/20/2021  9:20 AM Ameira Alessandrini, RBerdie Ogren NP CP-CP None  07/20/2021  9:30 AM CP-NURSE CP-CP None  08/02/2021  8:50 AM MLuetta Nutting DO PCK-PCK None  08/15/2021  4:00 PM Revankar, RReita Cliche MD CVD-HIGHPT None    No orders of the defined types were placed in this encounter.   -------------------------------

## 2021-07-18 NOTE — Progress Notes (Signed)
important  Sensitive Note     Nurse visit:   Patient arrived for her 2nd Spravato treatment, she was directed to the [facility] Treatment Room. Pt is being treated for Treatment Resistant Depression, after discussion with Rollene Fare we will give pt another dose of 56 mg today. (2 of the 28 mg nasal sprays), letting the pt get comfortable with the dosing before increasing. Pt was extremely anxious about moving on to the next dose of 84 mg. Patient arrived and taken to treatment room, discussed how she felt after her 1st dose, no side effects noted later in the day or the next day. Pt's presentation was much more dull, depressed, lethargic today, pt reports just feeling like this some days. Pt not excited about Spravato but nor complaining. Pt's Spravato arrives from [employer] via [employer] and delivered to office, all Spravato medication is stored at doctors office per REMS/FDA guidelines. Required to be locked behind two doors. Medication is classified as CIII. Pt does not take medication home, it's administered at an approved [place]. Medication is always observed by nurse. Beginning vitals are at 9:09 AM, 132/78, 87. Then the lights are turned off per pt request. She does make phone calls while in treatment, then does relax. Her 40 minute vitals are at 9:50 AM, 121/75, 78. Only side effect voiced these 2 treatments is the horrible taste she gets after using the inhalers. Pt given mint candy to help. Rollene Fare comes in at end of treatment to discuss how she is doing. Her discharge vitals are at 11:10 AM, 120/68, 77. Pt observed a total of 120 minutes per FDA/REMS protocol, pt was with nurse a total of 55 minutes of clinical assessment. Pt is scheduled next Wednesday, December 14 th.  LOT [id number] EXP 2025 FEB

## 2021-07-19 ENCOUNTER — Ambulatory Visit (INDEPENDENT_AMBULATORY_CARE_PROVIDER_SITE_OTHER): Payer: 59 | Admitting: Family Medicine

## 2021-07-19 ENCOUNTER — Encounter (INDEPENDENT_AMBULATORY_CARE_PROVIDER_SITE_OTHER): Payer: Self-pay

## 2021-07-19 ENCOUNTER — Telehealth: Payer: 59 | Admitting: Physician Assistant

## 2021-07-19 DIAGNOSIS — J111 Influenza due to unidentified influenza virus with other respiratory manifestations: Secondary | ICD-10-CM

## 2021-07-19 DIAGNOSIS — F3181 Bipolar II disorder: Secondary | ICD-10-CM | POA: Diagnosis not present

## 2021-07-19 MED ORDER — OSELTAMIVIR PHOSPHATE 75 MG PO CAPS: 75.0000 mg | ORAL_CAPSULE | Freq: Two times a day (BID) | ORAL | 0 refills | Status: DC

## 2021-07-19 MED ORDER — BENZONATATE 100 MG PO CAPS: 100.0000 mg | ORAL_CAPSULE | Freq: Three times a day (TID) | ORAL | 0 refills | Status: DC | PRN

## 2021-07-19 NOTE — Progress Notes (Signed)
Virtual Visit Consent   Tracy [name], you are scheduled for a virtual visit with a [facility] provider today.     Just as with appointments in the office, your consent must be obtained to participate.  Your consent will be active for this visit and any virtual visit you may have with one of our providers in the next 365 days.     If you have a MyChart account, a copy of this consent can be sent to you electronically.  All virtual visits are billed to your insurance company just like a traditional visit in the office.    As this is a virtual visit, video technology does not allow for your provider to perform a traditional examination.  This may limit your provider's ability to fully assess your condition.  If your provider identifies any concerns that need to be evaluated in person or the need to arrange testing (such as labs, EKG, etc.), we will make arrangements to do so.     Although advances in technology are sophisticated, we cannot ensure that it will always work on either your end or our end.  If the connection with a video visit is poor, the visit may have to be switched to a telephone visit.  With either a video or telephone visit, we are not always able to ensure that we have a secure connection.     I need to obtain your verbal consent now.   Are you willing to proceed with your visit today?    Tracy [name] has provided verbal consent on 07/19/2021 for a virtual visit (video or telephone).   Mar Daring, PA-C   Date: 07/19/2021 12:36 PM   Virtual Visit via Video Note   I, Mar Daring, connected with  Tracy [name]  ([id number], [birth year]) on 07/19/21 at 12:30 PM EST by a video-enabled telemedicine application and verified that I am speaking with the correct person using two identifiers.  Location: Patient: Virtual Visit Location Patient: Home Provider: Virtual Visit Location Provider: Home Office   I discussed the limitations of evaluation and  management by telemedicine and the availability of in person appointments. The patient expressed understanding and agreed to proceed.    History of Present Illness: Tracy [name] is a 55 y.o. who identifies as a female who was assigned female at birth, and is being seen today for flu-like symptoms.  HPI: Influenza This is a new problem. The current episode started yesterday. Associated symptoms include chest pain (tightness), chills, congestion, coughing (dry), diaphoresis, fatigue, a fever (102.1), myalgias and a sore throat (dry, scratcy). Pertinent negatives include no arthralgias, headaches, nausea or vomiting. Associated symptoms comments: Diarrhea, runny nose. She has tried rest (tylenol) for the symptoms. The treatment provided no relief.  Does have a positive sick contact.   Problems:  Patient Active Problem List   Diagnosis Date Noted   DOE (dyspnea on exertion) 03/21/2021   Coronary artery disease 03/21/2021   Palpitations 02/14/2021   Diarrhea    Gastroesophageal reflux disease    Nausea without vomiting    Gastritis and gastroduodenitis    Diverticulosis of colon without hemorrhage    Grade II internal hemorrhoids    Periumbilical pain 99/24/2683   Dyspepsia 12/14/2020   Leg swelling 09/21/2020   Morbid obesity with BMI of 40.0-44.9, adult ([place]) 06/23/2020   Chronic left shoulder pain 05/17/2020   Oral mucosal lesion 06/17/2019   Facial rash 06/16/2019   UTI (urinary tract infection) 04/22/2019  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   Unilateral primary osteoarthritis, left hip 02/10/2019   Greater trochanteric bursitis of left hip 07/24/2018   Arthritis of left hip 06/25/2018   Primary osteoarthritis of left knee 01/21/2018   Vitamin D deficiency 01/04/2018   Lower back pain 07/18/2017   Hemorrhoids, internal 07/18/2017   Trigeminal neuralgia 01/18/2016   Bipolar disorder ([place]) 11/28/2015   Borderline personality disorder ([place]) 11/28/2015    Carpal tunnel syndrome 09/06/2015   MDD (major depressive disorder) 06/06/2015   Severe episode of recurrent major depressive disorder, without psychotic features ([place])    History of posttraumatic stress disorder (PTSD)    GAD (generalized anxiety disorder)    Insulin resistance 02/09/2015   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/2015   Acanthosis nigricans, acquired 56/21/3086   Nonalcoholic fatty liver disease 02/09/2015   Female hirsutism 02/09/2015   Oligomenorrhea 02/09/2015   Infertility associated with anovulation 02/09/2015   Anxiety and depression 10/31/2014   Obstructive sleep apnea 06/27/2014   Diabetes mellitus, type 2 ([place]) 06/10/2013   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   Cystic teratoma    PCOS (polycystic ovarian syndrome)    TOBACCO USE, QUIT 02/08/2010   HYPOTHYROIDISM, POSTSURGICAL 10/02/2007   POLYCYSTIC OVARIAN DISEASE 10/02/2007   DEPRESSION 10/02/2007   Migraine 10/02/2007   Essential hypertension 10/02/2007   RENAL CALCULUS, HX OF 10/02/2007    Allergies:  Allergies  Allergen Reactions   Sulfa Antibiotics Itching, Swelling and Rash   Food     Walnut-mouth swelling/itching   Bupropion Hcl Itching and Rash   Codeine Itching   Sulfamethoxazole-Trimethoprim Rash   Medications:  Current Outpatient Medications:    benzonatate (TESSALON) 100 MG capsule, Take 1 capsule (100 mg total) by mouth 3 (three) times daily as needed., Disp: 30 capsule, Rfl: 0   folic acid (FOLVITE) 1 MG tablet, Take 1 tablet (1 mg total) by mouth daily., Disp: 30 tablet, Rfl: 0   oseltamivir (TAMIFLU) 75 MG capsule, Take 1 capsule (75 mg total) by mouth 2 (two) times daily., Disp: 10 capsule, Rfl: 0   AMBULATORY NON FORMULARY MEDICATION, Convertible/stand-up workstation such as "[employer].", Disp: 1 each, Rfl: 0   Blood Glucose Monitoring Suppl (FREESTYLE LITE) w/Device KIT, USE TO CHECK BLOOD SUGAR ONCE A DAY, Disp: 1 kit, Rfl: 0   Blood Glucose Monitoring Suppl (FREESTYLE LITE) w/Device KIT,  USE TO CHECK BLOOD SUGAR ONCE A DAY, Disp: 1 kit, Rfl: 0   clonazePAM (KLONOPIN) 1 MG tablet, Take 1 tablet (1 mg total) by mouth 3 (three) times daily., Disp: 30 tablet, Rfl: 5   Continuous Blood Gluc Receiver ([place]) DEVI, Use to monitor continuous blood glucose., Disp: 1 each, Rfl: 0   Continuous Blood Gluc Sensor (DEXCOM G6 SENSOR) MISC, Replace every 10 days.  Use to check blood sugar., Disp: 9 each, Rfl: 1   Continuous Blood Gluc Transmit (DEXCOM G6 TRANSMITTER) MISC, Use to check continuous blood sugar.  Replace every 90 days, Disp: 1 each, Rfl: 3   COVID-19 mRNA bivalent vaccine, Pfizer, (PFIZER COVID-19 VAC BIVALENT) injection, Inject into the muscle., Disp: 0.3 mL, Rfl: 0   cyclobenzaprine (FLEXERIL) 10 MG tablet, TAKE 1 TABLET BY MOUTH 2 TIMES DAILY AS NEEDED FOR MUSCLE SPASMS, Disp: 20 tablet, Rfl: 0   Esketamine HCl, 56 MG Dose, (SPRAVATO, 56 MG DOSE,) 28 MG/DEVICE SOPK, Administer 56 mg ( 2 of the 28 mg) intranasally as directed., Disp: 2 each, Rfl: 0   Esketamine HCl, 84 MG Dose, (SPRAVATO, 84 MG DOSE,)  28 MG/DEVICE SOPK, Administer 45m intranasally twice weekly. To be administered under the supervision of a healthcare professional., Disp: 9 each, Rfl: 0   famotidine (PEPCID) 40 MG tablet, Take 1 tablet by mouth 2  times daily., Disp: 180 tablet, Rfl: 1   ferrous sulfate 325 (65 FE) MG EC tablet, Take 1 tablet (325 mg total) by mouth in the morning and at bedtime. Take iron supplement 2 hours before or 4 hours after PPI or other antacids as this requires some level of gastric acidity to aid in absorption, Disp: 60 tablet, Rfl: 5   furosemide (LASIX) 40 MG tablet, TAKE 1 TABLET BY MOUTH DAILY., Disp: 30 tablet, Rfl: 3   glucose blood (FREESTYLE LITE) test strip, Use as instructed, Disp: 100 each, Rfl: 12   glucose blood test strip, USE TO CHECK BLOOD SUGAR ONCE A DAY (Patient taking differently: USE TO CHECK BLOOD SUGAR ONCE A DAY), Disp: 100 strip, Rfl: 12   hydrOXYzine  (ATARAX/VISTARIL) 25 MG tablet, Take 1 tablet (25 mg total) by mouth 3 (three) times daily as needed. (Patient taking differently: Take 25 mg by mouth at bedtime as needed (sleep).), Disp: 90 tablet, Rfl: 2   insulin degludec (TRESIBA FLEXTOUCH) 100 UNIT/ML FlexTouch Pen, Inject 30 Units into the skin 2 times daily., Disp: 54 mL, Rfl: 3   insulin lispro (HUMALOG KWIKPEN) 100 UNIT/ML KwikPen, Inject up to 16 units 3 times daily at mealtime per sliding scale., Disp: 15 mL, Rfl: 11   Insulin Pen Needle (UNIFINE PENTIPS) 31G X 5 MM MISC, Use as directed to inject insulin, Disp: 100 each, Rfl: 3   Lancets (FREESTYLE) lancets, USE TO TEST BLOOD SUGAR ONCE A DAY (Patient taking differently: USE TO TEST BLOOD SUGAR ONCE A DAY), Disp: 100 each, Rfl: 0   lidocaine (XYLOCAINE) 5 % ointment, APPLY 1 G TOPICALLY 3 (THREE) TIMES DAILY BEFORE MEALS (DOESNT COME IN GEL, FAXED DR) (Patient taking differently: Apply 1 application topically 3 (three) times daily as needed (oral sores).), Disp: 1063.2 g, Rfl: 2   lisinopril (ZESTRIL) 10 MG tablet, Take 1 tablet (10 mg total) by mouth daily., Disp: 90 tablet, Rfl: 3   meloxicam (MOBIC) 15 MG tablet, Take one tab by mouth every morning with a meal for 2 weeks, then daily as needed for pain., Disp: 30 tablet, Rfl: 0   NUVIGIL 200 MG TABS, Take 1 tablet by mouth daily as needed (sleepiness)., Disp: 30 tablet, Rfl: 2   nystatin (MYCOSTATIN/NYSTOP) powder, Apply 3 times daily as directed. (Patient taking differently: Apply 1 application topically daily.), Disp: 60 g, Rfl: 3   ondansetron (ZOFRAN-ODT) 4 MG disintegrating tablet, Take 4 mg by mouth 2 (two) times daily as needed for nausea or vomiting., Disp: , Rfl:    pimecrolimus (ELIDEL) 1 % cream, APPLY TOPICALLY 2 TIMES DAILY TO COMPLETELY DRY AREA PLACE A THIN LAYER ONTO THE MOST SORE AREAS PRIOR TO BED. DO NOT RINSE (Patient taking differently: Apply 1 application topically 2 (two) times daily as needed (mouth sores).), Disp:  60 g, Rfl: 0   RABEprazole (ACIPHEX) 20 MG tablet, Take 1 tablet (20 mg total) by mouth in the morning and at bedtime., Disp: 180 tablet, Rfl: 1   rizatriptan (MAXALT) 10 MG tablet, Take 1 tablet (10 mg total) by mouth as needed for migraine. May repeat in 2 hours if needed (Patient taking differently: Take 10 mg by mouth every 2 (two) hours as needed for migraine. May repeat in 2 hours if needed), Disp:  10 tablet, Rfl: 0   rosuvastatin (CRESTOR) 20 MG tablet, TAKE 1 TABLET BY MOUTH DAILY., Disp: 90 tablet, Rfl: 3   Semaglutide (RYBELSUS) 14 MG TABS, Take 1 tablet by mouth daily., Disp: 30 tablet, Rfl: 0   sertraline (ZOLOFT) 100 MG tablet, TAKE 2 TABLETS BY MOUTH EVERY MORNING, Disp: 180 tablet, Rfl: 3   spironolactone (ALDACTONE) 50 MG tablet, Take 1 tablet (50 mg total) by mouth 2 (two) times daily., Disp: 60 tablet, Rfl: 2   sucralfate (CARAFATE) 1 g tablet, Take 1 tablet (1 g total) by mouth 4 (four) times daily -  with meals and at bedtime for 15 days., Disp: 60 tablet, Rfl: 1   SYNTHROID 300 MCG tablet, TAKE 1 TABLET BY MOUTH ONCE A DAY BEFORE BREAKFAST, Disp: 90 tablet, Rfl: 1   Vitamin D, Ergocalciferol, (DRISDOL) 1.25 MG (50000 UNIT) CAPS capsule, TAKE 1 CAPSULE BY MOUTH EVERY 7 DAYS., Disp: 12 capsule, Rfl: 1  Observations/Objective: Patient is well-developed, well-nourished in no acute distress.  Patient appears ill Resting comfortably at home.  Head is normocephalic, atraumatic.  No labored breathing.  Speech is clear and coherent with logical content.  Patient is alert and oriented at baseline.    Assessment and Plan: 1. Influenza - oseltamivir (TAMIFLU) 75 MG capsule; Take 1 capsule (75 mg total) by mouth 2 (two) times daily.  Dispense: 10 capsule; Refill: 0 - benzonatate (TESSALON) 100 MG capsule; Take 1 capsule (100 mg total) by mouth 3 (three) times daily as needed.  Dispense: 30 capsule; Refill: 0  - Suspect influenza due to symptoms and positive exposure - Tamiflu  prescribed - Tessalon perles for cough - Continue OTC medication of choice for symptomatic management - Push fluids - Rest - Seek in person evaluation if symptoms worsen or fail to improve   Follow Up Instructions: I discussed the assessment and treatment plan with the patient. The patient was provided an opportunity to ask questions and all were answered. The patient agreed with the plan and demonstrated an understanding of the instructions.  A copy of instructions were sent to the patient via MyChart unless otherwise noted below.    The patient was advised to call back or seek an in-person evaluation if the symptoms worsen or if the condition fails to improve as anticipated.  Time:  I spent 12 minutes with the patient via telehealth technology discussing the above problems/concerns.    Mar Daring, PA-C

## 2021-07-19 NOTE — Patient Instructions (Signed)
Tracy [name], thank you for joining Mar Daring, PA-C for today's virtual visit.  While this provider is not your primary care provider (PCP), if your PCP is located in our provider database this encounter information will be shared with them immediately following your visit.  Consent: (Patient) Tracy [name] provided verbal consent for this virtual visit at the beginning of the encounter.  Current Medications:  Current Outpatient Medications:    benzonatate (TESSALON) 100 MG capsule, Take 1 capsule (100 mg total) by mouth 3 (three) times daily as needed., Disp: 30 capsule, Rfl: 0   folic acid (FOLVITE) 1 MG tablet, Take 1 tablet (1 mg total) by mouth daily., Disp: 30 tablet, Rfl: 0   oseltamivir (TAMIFLU) 75 MG capsule, Take 1 capsule (75 mg total) by mouth 2 (two) times daily., Disp: 10 capsule, Rfl: 0   AMBULATORY NON FORMULARY MEDICATION, Convertible/stand-up workstation such as "[employer].", Disp: 1 each, Rfl: 0   Blood Glucose Monitoring Suppl (FREESTYLE LITE) w/Device KIT, USE TO CHECK BLOOD SUGAR ONCE A DAY, Disp: 1 kit, Rfl: 0   Blood Glucose Monitoring Suppl (FREESTYLE LITE) w/Device KIT, USE TO CHECK BLOOD SUGAR ONCE A DAY, Disp: 1 kit, Rfl: 0   clonazePAM (KLONOPIN) 1 MG tablet, Take 1 tablet (1 mg total) by mouth 3 (three) times daily., Disp: 30 tablet, Rfl: 5   Continuous Blood Gluc Receiver (Simpson) DEVI, Use to monitor continuous blood glucose., Disp: 1 each, Rfl: 0   Continuous Blood Gluc Sensor (DEXCOM G6 SENSOR) MISC, Replace every 10 days.  Use to check blood sugar., Disp: 9 each, Rfl: 1   Continuous Blood Gluc Transmit (DEXCOM G6 TRANSMITTER) MISC, Use to check continuous blood sugar.  Replace every 90 days, Disp: 1 each, Rfl: 3   COVID-19 mRNA bivalent vaccine, Pfizer, (PFIZER COVID-19 VAC BIVALENT) injection, Inject into the muscle., Disp: 0.3 mL, Rfl: 0   cyclobenzaprine (FLEXERIL) 10 MG tablet, TAKE 1 TABLET BY MOUTH 2 TIMES DAILY AS NEEDED  FOR MUSCLE SPASMS, Disp: 20 tablet, Rfl: 0   Esketamine HCl, 56 MG Dose, (SPRAVATO, 56 MG DOSE,) 28 MG/DEVICE SOPK, Administer 56 mg ( 2 of the 28 mg) intranasally as directed., Disp: 2 each, Rfl: 0   Esketamine HCl, 84 MG Dose, (SPRAVATO, 84 MG DOSE,) 28 MG/DEVICE SOPK, Administer $RemoveBeforeDE'84mg'[id number]$  intranasally twice weekly. To be administered under the supervision of a healthcare professional., Disp: 9 each, Rfl: 0   famotidine (PEPCID) 40 MG tablet, Take 1 tablet by mouth 2  times daily., Disp: 180 tablet, Rfl: 1   ferrous sulfate 325 (65 FE) MG EC tablet, Take 1 tablet (325 mg total) by mouth in the morning and at bedtime. Take iron supplement 2 hours before or 4 hours after PPI or other antacids as this requires some level of gastric acidity to aid in absorption, Disp: 60 tablet, Rfl: 5   furosemide (LASIX) 40 MG tablet, TAKE 1 TABLET BY MOUTH DAILY., Disp: 30 tablet, Rfl: 3   glucose blood (FREESTYLE LITE) test strip, Use as instructed, Disp: 100 each, Rfl: 12   glucose blood test strip, USE TO CHECK BLOOD SUGAR ONCE A DAY (Patient taking differently: USE TO CHECK BLOOD SUGAR ONCE A DAY), Disp: 100 strip, Rfl: 12   hydrOXYzine (ATARAX/VISTARIL) 25 MG tablet, Take 1 tablet (25 mg total) by mouth 3 (three) times daily as needed. (Patient taking differently: Take 25 mg by mouth at bedtime as needed (sleep).), Disp: 90 tablet, Rfl: 2   insulin degludec (TRESIBA  FLEXTOUCH) 100 UNIT/ML FlexTouch Pen, Inject 30 Units into the skin 2 times daily., Disp: 54 mL, Rfl: 3   insulin lispro (HUMALOG KWIKPEN) 100 UNIT/ML KwikPen, Inject up to 16 units 3 times daily at mealtime per sliding scale., Disp: 15 mL, Rfl: 11   Insulin Pen Needle (UNIFINE PENTIPS) 31G X 5 MM MISC, Use as directed to inject insulin, Disp: 100 each, Rfl: 3   Lancets (FREESTYLE) lancets, USE TO TEST BLOOD SUGAR ONCE A DAY (Patient taking differently: USE TO TEST BLOOD SUGAR ONCE A DAY), Disp: 100 each, Rfl: 0   lidocaine (XYLOCAINE) 5 % ointment, APPLY 1  G TOPICALLY 3 (THREE) TIMES DAILY BEFORE MEALS (DOESNT COME IN GEL, FAXED DR) (Patient taking differently: Apply 1 application topically 3 (three) times daily as needed (oral sores).), Disp: 1063.2 g, Rfl: 2   lisinopril (ZESTRIL) 10 MG tablet, Take 1 tablet (10 mg total) by mouth daily., Disp: 90 tablet, Rfl: 3   meloxicam (MOBIC) 15 MG tablet, Take one tab by mouth every morning with a meal for 2 weeks, then daily as needed for pain., Disp: 30 tablet, Rfl: 0   NUVIGIL 200 MG TABS, Take 1 tablet by mouth daily as needed (sleepiness)., Disp: 30 tablet, Rfl: 2   nystatin (MYCOSTATIN/NYSTOP) powder, Apply 3 times daily as directed. (Patient taking differently: Apply 1 application topically daily.), Disp: 60 g, Rfl: 3   ondansetron (ZOFRAN-ODT) 4 MG disintegrating tablet, Take 4 mg by mouth 2 (two) times daily as needed for nausea or vomiting., Disp: , Rfl:    pimecrolimus (ELIDEL) 1 % cream, APPLY TOPICALLY 2 TIMES DAILY TO COMPLETELY DRY AREA PLACE A THIN LAYER ONTO THE MOST SORE AREAS PRIOR TO BED. DO NOT RINSE (Patient taking differently: Apply 1 application topically 2 (two) times daily as needed (mouth sores).), Disp: 60 g, Rfl: 0   RABEprazole (ACIPHEX) 20 MG tablet, Take 1 tablet (20 mg total) by mouth in the morning and at bedtime., Disp: 180 tablet, Rfl: 1   rizatriptan (MAXALT) 10 MG tablet, Take 1 tablet (10 mg total) by mouth as needed for migraine. May repeat in 2 hours if needed (Patient taking differently: Take 10 mg by mouth every 2 (two) hours as needed for migraine. May repeat in 2 hours if needed), Disp: 10 tablet, Rfl: 0   rosuvastatin (CRESTOR) 20 MG tablet, TAKE 1 TABLET BY MOUTH DAILY., Disp: 90 tablet, Rfl: 3   Semaglutide (RYBELSUS) 14 MG TABS, Take 1 tablet by mouth daily., Disp: 30 tablet, Rfl: 0   sertraline (ZOLOFT) 100 MG tablet, TAKE 2 TABLETS BY MOUTH EVERY MORNING, Disp: 180 tablet, Rfl: 3   spironolactone (ALDACTONE) 50 MG tablet, Take 1 tablet (50 mg total) by mouth 2  (two) times daily., Disp: 60 tablet, Rfl: 2   sucralfate (CARAFATE) 1 g tablet, Take 1 tablet (1 g total) by mouth 4 (four) times daily -  with meals and at bedtime for 15 days., Disp: 60 tablet, Rfl: 1   SYNTHROID 300 MCG tablet, TAKE 1 TABLET BY MOUTH ONCE A DAY BEFORE BREAKFAST, Disp: 90 tablet, Rfl: 1   Vitamin D, Ergocalciferol, (DRISDOL) 1.25 MG (50000 UNIT) CAPS capsule, TAKE 1 CAPSULE BY MOUTH EVERY 7 DAYS., Disp: 12 capsule, Rfl: 1   Medications ordered in this encounter:  Meds ordered this encounter  Medications   oseltamivir (TAMIFLU) 75 MG capsule    Sig: Take 1 capsule (75 mg total) by mouth 2 (two) times daily.    Dispense:  10 capsule  Refill:  0    Order Specific Question:   Supervising Provider    Answer:   Sabra Heck, BRIAN [[id number]]   benzonatate (TESSALON) 100 MG capsule    Sig: Take 1 capsule (100 mg total) by mouth 3 (three) times daily as needed.    Dispense:  30 capsule    Refill:  0    Order Specific Question:   Supervising Provider    Answer:   Sabra Heck, [place]     *If you need refills on other medications prior to your next appointment, please contact your pharmacy*  Follow-Up: Call back or seek an in-person evaluation if the symptoms worsen or if the condition fails to improve as anticipated.  Other Instructions Influenza, Adult Influenza, also called "the flu," is a viral infection that mainly affects the respiratory tract. This includes the lungs, nose, and throat. The flu spreads easily from person to person (is contagious). It causes common cold symptoms, along with high fever and body aches. What are the causes? This condition is caused by the influenza virus. You can get the virus by: Breathing in droplets that are in the air from an infected person's cough or sneeze. Touching something that has the virus on it (has been contaminated) and then touching your mouth, nose, or eyes. What increases the risk? The following factors may make you more likely  to get the flu: Not washing or sanitizing your hands often. Having close contact with many people during cold and flu season. Touching your mouth, eyes, or nose without first washing or sanitizing your hands. Not getting an annual flu shot. You may have a higher risk for the flu, including serious problems, such as a lung infection (pneumonia), if you: Are older than 65. Are pregnant. Have a weakened disease-fighting system (immune system). This includes people who have HIV or AIDS, are on chemotherapy, or are taking medicines that reduce (suppress) the immune system. Have a long-term (chronic) illness, such as heart disease, kidney disease, diabetes, or lung disease. Have a liver disorder. Are severely overweight (morbidly obese). Have anemia. Have asthma. What are the signs or symptoms? Symptoms of this condition usually begin suddenly and last 4-14 days. These may include: Fever and chills. Headaches, body aches, or muscle aches. Sore throat. Cough. Runny or stuffy (congested) nose. Chest discomfort. Poor appetite. Weakness or fatigue. Dizziness. Nausea or vomiting. How is this diagnosed? This condition may be diagnosed based on: Your symptoms and medical history. A physical exam. Swabbing your nose or throat and testing the fluid for the influenza virus. How is this treated? If the flu is diagnosed early, you can be treated with antiviral medicine that is given by mouth (orally) or through an IV. This can help reduce how severe the illness is and how long it lasts. Taking care of yourself at home can help relieve symptoms. Your health care provider may recommend: Taking over-the-counter medicines. Drinking plenty of fluids. In many cases, the flu goes away on its own. If you have severe symptoms or complications, you may be treated in a hospital. Follow these instructions at home: Activity Rest as needed and get plenty of sleep. Stay home from work or school as told by  your health care provider. Unless you are visiting your health care provider, avoid leaving home until your fever has been gone for 24 hours without taking medicine. Eating and drinking Take an oral rehydration solution (ORS). This is a drink that is sold at pharmacies and retail stores. Drink enough  fluid to keep your urine pale yellow. Drink clear fluids in small amounts as you are able. Clear fluids include water, ice chips, fruit juice mixed with water, and low-calorie sports drinks. Eat bland, easy-to-digest foods in small amounts as you are able. These foods include bananas, applesauce, rice, lean meats, toast, and crackers. Avoid drinking fluids that contain a lot of sugar or caffeine, such as energy drinks, regular sports drinks, and soda. Avoid alcohol. Avoid spicy or fatty foods. General instructions   Take over-the-counter and prescription medicines only as told by your health care provider. Use a cool mist humidifier to add humidity to the air in your home. This can make it easier to breathe. When using a cool mist humidifier, clean it daily. Empty the water and replace it with clean water. Cover your mouth and nose when you cough or sneeze. Wash your hands with soap and water often and for at least 20 seconds, especially after you cough or sneeze. If soap and water are not available, use alcohol-based hand sanitizer. Keep all follow-up visits. This is important. How is this prevented?  Get an annual flu shot. This is usually available in late summer, fall, or winter. Ask your health care provider when you should get your flu shot. Avoid contact with people who are sick during cold and flu season. This is generally fall and winter. Contact a health care provider if: You develop new symptoms. You have: Chest pain. Diarrhea. A fever. Your cough gets worse. You produce more mucus. You feel nauseous or you vomit. Get help right away if you: Develop shortness of breath or have  difficulty breathing. Have skin or nails that turn a bluish color. Have severe pain or stiffness in your neck. Develop a sudden headache or sudden pain in your face or ear. Cannot eat or drink without vomiting. These symptoms may represent a serious problem that is an emergency. Do not wait to see if the symptoms will go away. Get medical help right away. Call your local emergency services (911 in the [place] Do not drive yourself to the hospital. Summary Influenza, also called "the flu," is a viral infection that primarily affects your respiratory tract. Symptoms of the flu usually begin suddenly and last 4-14 days. Getting an annual flu shot is the best way to prevent getting the flu. Stay home from work or school as told by your health care provider. Unless you are visiting your health care provider, avoid leaving home until your fever has been gone for 24 hours without taking medicine. Keep all follow-up visits. This is important. This information is not intended to replace advice given to you by your health care provider. Make sure you discuss any questions you have with your health care provider. Document Revised: 03/10/2020 Document Reviewed: 03/10/2020 Elsevier Patient Education  2022 [name].    If you have been instructed to have an in-person evaluation today at a local Urgent Care facility, please use the link below. It will take you to a list of all of our available [facility], including address, phone number and hours of operation. Please do not delay care.  [facility]  If you or a family member do not have a primary care provider, use the link below to schedule a visit and establish care. When you choose a [facility] primary care physician or advanced practice provider, you gain a long-term partner in health. Find a Primary Care Provider  Learn more about [facility]'s  in-office and virtual care options: [facility]

## 2021-07-20 ENCOUNTER — Ambulatory Visit: Payer: 59 | Admitting: Adult Health

## 2021-07-21 ENCOUNTER — Other Ambulatory Visit (HOSPITAL_COMMUNITY): Payer: Self-pay

## 2021-07-23 ENCOUNTER — Other Ambulatory Visit (HOSPITAL_COMMUNITY): Payer: Self-pay

## 2021-07-23 ENCOUNTER — Other Ambulatory Visit (INDEPENDENT_AMBULATORY_CARE_PROVIDER_SITE_OTHER): Payer: Self-pay | Admitting: Family Medicine

## 2021-07-23 ENCOUNTER — Other Ambulatory Visit: Payer: Self-pay | Admitting: Family Medicine

## 2021-07-23 DIAGNOSIS — M62831 Muscle spasm of calf: Secondary | ICD-10-CM

## 2021-07-23 DIAGNOSIS — E1165 Type 2 diabetes mellitus with hyperglycemia: Secondary | ICD-10-CM

## 2021-07-23 NOTE — Progress Notes (Signed)
Nurse visit:   Patient arrived for her 3rd Spravato treatment, she was directed to the [facility] Treatment Room. Pt is being treated for Treatment Resistant Depression, pt is ready to move up to the next dose of 84 mg, maintenance dose. (3 of the 28 mg nasal sprays). Pt was extremely anxious about moving on to the next dose of 84 mg last week but today she is more comfortable and ready. Patient arrived and taken to treatment room, discussed how she felt, which response is okay. Pt's presentation was not as dull, remains depressed, pt reports her ex-husband has the flu and she didn't look like she felt too well herself. Pt's Spravato arrives from [employer] via [employer] and delivered to office, all Spravato medication is stored at doctors office per REMS/FDA guidelines. Required to be locked behind two doors. Medication is classified as CIII. Pt does not take medication home, it's administered at an approved [facility]. Medication is always observed by nurse. Beginning vitals are at 9:15 AM, 114/68, 78. Advised pt to blow her nose and recline back 45 degrees in her chair while the nasal sprays are being administered. Then the lights are turned off per pt request. She does make phone calls while in treatment, then does relax some at the end. Her 40 minute vitals are at 9:55 AM, 117/82, 72. Only side effect voiced these 3 treatments is the horrible taste she gets after using the inhalers. Pt given mint candy to help. Rollene Fare comes in at end of treatment to discuss how she is doing. Her discharge vitals are at 11:15 AM, 112/72, 74. Pt observed a total of 120 minutes per FDA/REMS protocol, pt was with nurse a total of 55 minutes of clinical assessment. Pt is scheduled this Friday, December 16th.    LOT [id number] EXP 2025 FEB

## 2021-07-24 ENCOUNTER — Other Ambulatory Visit (HOSPITAL_COMMUNITY): Payer: Self-pay

## 2021-07-24 ENCOUNTER — Other Ambulatory Visit: Payer: Self-pay

## 2021-07-24 ENCOUNTER — Other Ambulatory Visit: Payer: Self-pay | Admitting: Adult Health

## 2021-07-24 ENCOUNTER — Telehealth: Payer: Self-pay | Admitting: Adult Health

## 2021-07-24 DIAGNOSIS — F410 Panic disorder [episodic paroxysmal anxiety] without agoraphobia: Secondary | ICD-10-CM

## 2021-07-24 MED ORDER — CYCLOBENZAPRINE HCL 10 MG PO TABS
ORAL_TABLET | ORAL | 0 refills | Status: DC
  Filled 2021-07-24: qty 20, 10d supply, fill #0

## 2021-07-24 MED ORDER — LAMOTRIGINE 200 MG PO TABS
200.0000 mg | ORAL_TABLET | Freq: Every day | ORAL | 0 refills | Status: DC
  Filled 2021-07-24: qty 90, 90d supply, fill #0

## 2021-07-24 MED ORDER — LAMOTRIGINE 200 MG PO TABS
400.0000 mg | ORAL_TABLET | Freq: Every day | ORAL | 0 refills | Status: DC
  Filled 2021-07-24: qty 180, 90d supply, fill #0

## 2021-07-24 MED ORDER — RYBELSUS 14 MG PO TABS
14.0000 mg | ORAL_TABLET | Freq: Every day | ORAL | 0 refills | Status: DC
  Filled 2021-07-24: qty 30, 30d supply, fill #0

## 2021-07-24 NOTE — Telephone Encounter (Signed)
Dr.Beasley 

## 2021-07-24 NOTE — Telephone Encounter (Signed)
Pt left a message that she needs a refill on her lamictal. Please send it into the [facility] pharmacy. She would like a 90 supply of this medicine. Her phone number at [phone]

## 2021-07-24 NOTE — Telephone Encounter (Signed)
LAST APPOINTMENT DATE: 06/20/21 NEXT APPOINTMENT DATE: 08/02/21   [place] [address] Phone: [phone] Fax: [phone]  [place], [place] [address] Phone: [phone] Fax: [phone]  [id number] - [place], [place] - [address] [place] [address] Phone: [phone] Fax: [phone]  Patient is requesting a refill of the following medications: Requested Prescriptions   Pending Prescriptions Disp Refills   Semaglutide (RYBELSUS) 14 MG TABS 30 tablet 0    Sig: Take 1 tablet by mouth daily.    Date last filled: 06/20/21 Previously prescribed by Dr. Leafy Ro  Lab Results  Component Value Date   HGBA1C 11.1 (A) 04/25/2021   HGBA1C 8.3 (H) 12/26/2020   HGBA1C 8.0 (H) 09/21/2020   Lab Results  Component Value Date   MICROALBUR 0.6 01/31/2017   LDLCALC 85 10/11/2020   CREATININE 0.85 03/22/2021   Lab Results  Component Value Date   VD25OH 14.0 (L) 10/11/2020   VD25OH 36 06/25/2018   VD25OH 22.91 (L) 03/25/2018    BP Readings from Last 3 Encounters:  06/20/21 99/67  05/23/21 107/69  05/03/21 98/63

## 2021-07-24 NOTE — Telephone Encounter (Signed)
Pended.

## 2021-07-25 ENCOUNTER — Ambulatory Visit: Payer: 59 | Admitting: Family Medicine

## 2021-07-25 ENCOUNTER — Other Ambulatory Visit (HOSPITAL_COMMUNITY): Payer: Self-pay

## 2021-07-25 ENCOUNTER — Ambulatory Visit (INDEPENDENT_AMBULATORY_CARE_PROVIDER_SITE_OTHER): Payer: 59 | Admitting: Behavioral Health

## 2021-07-25 ENCOUNTER — Ambulatory Visit: Payer: 59

## 2021-07-25 ENCOUNTER — Other Ambulatory Visit: Payer: Self-pay | Admitting: Adult Health

## 2021-07-25 ENCOUNTER — Encounter: Payer: Self-pay | Admitting: Behavioral Health

## 2021-07-25 ENCOUNTER — Other Ambulatory Visit: Payer: Self-pay

## 2021-07-25 VITALS — BP 111/69 | HR 72

## 2021-07-25 DIAGNOSIS — F329 Major depressive disorder, single episode, unspecified: Secondary | ICD-10-CM

## 2021-07-25 DIAGNOSIS — F411 Generalized anxiety disorder: Secondary | ICD-10-CM

## 2021-07-25 DIAGNOSIS — F410 Panic disorder [episodic paroxysmal anxiety] without agoraphobia: Secondary | ICD-10-CM

## 2021-07-25 DIAGNOSIS — F339 Major depressive disorder, recurrent, unspecified: Secondary | ICD-10-CM

## 2021-07-25 DIAGNOSIS — F4310 Post-traumatic stress disorder, unspecified: Secondary | ICD-10-CM

## 2021-07-25 DIAGNOSIS — F3181 Bipolar II disorder: Secondary | ICD-10-CM

## 2021-07-25 DIAGNOSIS — F331 Major depressive disorder, recurrent, moderate: Secondary | ICD-10-CM

## 2021-07-25 DIAGNOSIS — G4733 Obstructive sleep apnea (adult) (pediatric): Secondary | ICD-10-CM

## 2021-07-25 NOTE — Progress Notes (Signed)
Nurse visit:   Patient arrived for her 4th Spravato treatment, she was directed to the [facility] Treatment Room. Pt is being treated for Treatment Resistant Depression, pt will be receiving the 84 mg, maintenance dose. (3 of the 28 mg nasal sprays). Pt had to miss one of her treatments last week due to flu. Pt feeling better with symptoms today, but still remains very down and depressed. Keeps asking when the medication will start working, encouraged her to give it time but she is just starting and has had an interruption in her treatment and will again this week due to the holidays. Encouraged her to try and be positive instead feeling hopeless about it not working yet. She agreed. Pt's presentation was very flat, dull, depressed.   Pt's Spravato arrives from [employer] via [employer] and delivered to office, all Spravato medication is stored at doctors office per REMS/FDA guidelines. Required to be locked behind two doors. Medication is classified as CIII. Pt does not take medication home, it's administered at an approved [facility]. Medication is always observed by nurse. Beginning vitals are at 9:00 AM, 114/69, 81. Advised pt to blow her nose and recline back 45 degrees in her chair while the nasal sprays are being administered, pt given 1st inhaler then received the next 2 with 5 minutes apart. Kept reminding pt to stay reclined during this time. Then the lights are turned off per pt request.  Pt was brought water and coffee. When retruning with drinks she said it felt really strong today and did I give her extra, reassured her it was same dose as last week. Her 40 minute vitals are at 9:40 AM, 122/69, 73. Pt is sedate during her entire visit today, she did have music playing in the room. Rollene Fare is out of the office today, so Lesle Chris, NP assessed pt at end of visit, no complaints or requests given, she reports being fine to him.  Her discharge vitals are at 11:00 AM, 111/68, 72 Pt  observed a total of 120 minutes per FDA/REMS protocol, pt was with nurse a total of 55 minutes of clinical assessment. Pt is scheduled next Wednesday, December 28th due to Christmas Holiday.    LOT [id number] EXP 2025 FEB

## 2021-07-25 NOTE — Telephone Encounter (Signed)
Can you change this to generic?

## 2021-07-25 NOTE — Progress Notes (Unsigned)
Tracy [name] [id number] [birth year] 55 y.o.  Subjective:   Patient ID:  Tracy [name] is a 55 y.o. (DOB May 24, 1966) female.  Chief Complaint: No chief complaint on file.   HPI  Tracy [name] presents to the office today for Spavato treatment - 4.    Patient was administered Spravato 84 mg intranasally today. She was observed by provider throughout [facility] treatment. The patient experienced the typical dissociation which gradually resolved over the 2-hour period of observation. There were no complications. Specifically , the patient did not have  any untoward side effects - feeling disconnected from herself, her thoughts, feelings and things around her, dizziness, nausea, feeling sleepy, decreased feeling of sensitivity (numbness)spinning sensation, feeling anxious, lack of energy, increased blood pressure, nausea, vomiting, feeling drink, or feeling happy or very excited.nausea or vomiting or headache. Blood pressures remained within normal ranges at the 40-minute and 2-hour follow-up intervals. By the time the 2-hour observation period was met the patient was alert and oriented and able to exit without assistance. 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 See nursing note for further details.        [place] Office Visit from 03/14/2021 in [place] Office Visit from 02/14/2021 in [place] Office Visit from 03/01/2020 in [place]  Total GAD-7 Score 17 12 11       PHQ2-9    [place] Office Visit from 03/14/2021 in [place] Office Visit from 02/14/2021 in [place] Visit from 10/11/2020 in [place] Office Visit from 03/01/2020 in [place] Visit from 06/16/2019 in [place] at [facility]  PHQ-2 Total Score 6 6 6 3 2   PHQ-9 Total Score 19 21 19 9 9       Flowsheet Row ED from 02/11/2021 in [place] ED from 02/10/2021 in [place] [facility] Admission (Discharged) from 01/10/2021 in [place] No Risk No Risk No Risk        Review of Systems:  Review of Systems  Medications: I have reviewed the patient's current medications.  Current Outpatient Medications  Medication Sig Dispense Refill   folic acid (FOLVITE) 1 MG tablet Take 1 tablet (1 mg total) by mouth daily. 30 tablet 0   Semaglutide (RYBELSUS) 14 MG TABS Take 1 tablet by mouth daily. 30 tablet 0   AMBULATORY NON FORMULARY MEDICATION Convertible/stand-up workstation such as "[employer]." 1 each 0   benzonatate (TESSALON) 100 MG capsule Take 1 capsule (100 mg total) by mouth 3 (three) times daily as needed. 30 capsule 0   Blood Glucose Monitoring Suppl (FREESTYLE LITE) w/Device KIT USE TO CHECK BLOOD SUGAR ONCE A DAY 1 kit 0   Blood Glucose Monitoring Suppl (FREESTYLE LITE) w/Device KIT USE TO CHECK BLOOD SUGAR ONCE A DAY 1 kit 0   clonazePAM (KLONOPIN) 1 MG tablet Take 1 tablet (1 mg total) by mouth 3 (three) times daily. 30 tablet 5   Continuous Blood Gluc Receiver (DEXCOM G6 RECEIVER) DEVI Use to monitor continuous blood glucose. 1 each 0   Continuous Blood Gluc Sensor (DEXCOM G6 SENSOR) MISC Replace every 10 days.  Use to check blood sugar. 9 each  1   Continuous Blood Gluc Transmit (DEXCOM G6 TRANSMITTER) MISC Use to check continuous blood sugar.  Replace every 90 days 1 each 3   COVID-19 mRNA bivalent vaccine, Pfizer, (PFIZER COVID-19 VAC BIVALENT) injection Inject into the muscle. 0.3 mL 0   cyclobenzaprine (FLEXERIL) 10 MG tablet TAKE 1 TABLET BY MOUTH 2 TIMES DAILY AS NEEDED FOR MUSCLE SPASMS 20 tablet 0   Esketamine HCl,  56 MG Dose, (SPRAVATO, 56 MG DOSE,) 28 MG/DEVICE SOPK Administer 56 mg ( 2 of the 28 mg) intranasally as directed. 2 each 0   Esketamine HCl, 84 MG Dose, (SPRAVATO, 84 MG DOSE,) 28 MG/DEVICE SOPK Administer 38m intranasally twice weekly. To be administered under the supervision of a healthcare professional. 9 each 0   famotidine (PEPCID) 40 MG tablet Take 1 tablet by mouth 2  times daily. 180 tablet 1   ferrous sulfate 325 (65 FE) MG EC tablet Take 1 tablet (325 mg total) by mouth in the morning and at bedtime. Take iron supplement 2 hours before or 4 hours after PPI or other antacids as this requires some level of gastric acidity to aid in absorption 60 tablet 5   furosemide (LASIX) 40 MG tablet TAKE 1 TABLET BY MOUTH DAILY. 30 tablet 3   glucose blood (FREESTYLE LITE) test strip Use as instructed 100 each 12   glucose blood test strip USE TO CHECK BLOOD SUGAR ONCE A DAY (Patient taking differently: USE TO CHECK BLOOD SUGAR ONCE A DAY) 100 strip 12   hydrOXYzine (ATARAX/VISTARIL) 25 MG tablet Take 1 tablet (25 mg total) by mouth 3 (three) times daily as needed. (Patient taking differently: Take 25 mg by mouth at bedtime as needed (sleep).) 90 tablet 2   insulin degludec (TRESIBA FLEXTOUCH) 100 UNIT/ML FlexTouch Pen Inject 30 Units into the skin 2 times daily. 54 mL 3   insulin lispro (HUMALOG KWIKPEN) 100 UNIT/ML KwikPen Inject up to 16 units 3 times daily at mealtime per sliding scale. 15 mL 11   Insulin Pen Needle (UNIFINE PENTIPS) 31G X 5 MM MISC Use as directed to inject insulin 100 each 3   lamoTRIgine (LAMICTAL) 200 MG tablet Take 2 tablets by mouth at bedtime. 180 tablet 0   Lancets (FREESTYLE) lancets USE TO TEST BLOOD SUGAR ONCE A DAY (Patient taking differently: USE TO TEST BLOOD SUGAR ONCE A DAY) 100 each 0   lidocaine (XYLOCAINE) 5 % ointment APPLY 1 G TOPICALLY 3 (THREE) TIMES DAILY BEFORE MEALS (DOESNT COME IN GEL, FAXED DR) (Patient taking differently: Apply 1 application topically 3  (three) times daily as needed (oral sores).) 1063.2 g 2   lisinopril (ZESTRIL) 10 MG tablet Take 1 tablet (10 mg total) by mouth daily. 90 tablet 3   meloxicam (MOBIC) 15 MG tablet Take one tab by mouth every morning with a meal for 2 weeks, then daily as needed for pain. 30 tablet 0   NUVIGIL 200 MG TABS Take 1 tablet by mouth daily as needed (sleepiness). 30 tablet 2   nystatin (MYCOSTATIN/NYSTOP) powder Apply 3 times daily as directed. (Patient taking differently: Apply 1 application topically daily.) 60 g 3   ondansetron (ZOFRAN-ODT) 4 MG disintegrating tablet Take 4 mg by mouth 2 (two) times daily as needed for nausea or vomiting.     oseltamivir (TAMIFLU) 75 MG capsule Take 1 capsule (75 mg total) by mouth 2 (two) times daily. 10 capsule 0   pimecrolimus (ELIDEL) 1 % cream APPLY TOPICALLY 2 TIMES DAILY TO COMPLETELY DRY  AREA PLACE A THIN LAYER ONTO THE MOST SORE AREAS PRIOR TO BED. DO NOT RINSE (Patient taking differently: Apply 1 application topically 2 (two) times daily as needed (mouth sores).) 60 g 0   RABEprazole (ACIPHEX) 20 MG tablet Take 1 tablet (20 mg total) by mouth in the morning and at bedtime. 180 tablet 1   rizatriptan (MAXALT) 10 MG tablet Take 1 tablet (10 mg total) by mouth as needed for migraine. May repeat in 2 hours if needed (Patient taking differently: Take 10 mg by mouth every 2 (two) hours as needed for migraine. May repeat in 2 hours if needed) 10 tablet 0   rosuvastatin (CRESTOR) 20 MG tablet TAKE 1 TABLET BY MOUTH DAILY. 90 tablet 3   sertraline (ZOLOFT) 100 MG tablet TAKE 2 TABLETS BY MOUTH EVERY MORNING 180 tablet 3   spironolactone (ALDACTONE) 50 MG tablet Take 1 tablet (50 mg total) by mouth 2 (two) times daily. 60 tablet 2   sucralfate (CARAFATE) 1 g tablet Take 1 tablet (1 g total) by mouth 4 (four) times daily -  with meals and at bedtime for 15 days. 60 tablet 1   SYNTHROID 300 MCG tablet TAKE 1 TABLET BY MOUTH ONCE A DAY BEFORE BREAKFAST 90 tablet 1   Vitamin  D, Ergocalciferol, (DRISDOL) 1.25 MG (50000 UNIT) CAPS capsule TAKE 1 CAPSULE BY MOUTH EVERY 7 DAYS. 12 capsule 1   No current facility-administered medications for this visit.    Medication Side Effects: None  Allergies:  Allergies  Allergen Reactions   Sulfa Antibiotics Itching, Swelling and Rash   Food     Walnut-mouth swelling/itching   Bupropion Hcl Itching and Rash   Codeine Itching   Sulfamethoxazole-Trimethoprim Rash    Past Medical History:  Diagnosis Date   Anxiety    Arthritis    back, knees, right elbow   Back pain    Bilateral swelling of feet and ankles    Bipolar disorder (HCC)    Borderline personality disorder ([place])    Cancer (HCC)    oral   Chewing difficulty    Cirrhosis (HCC)    Constipation    Dental crowns present    Depression    Diabetes mellitus without complication ([place])    Fatty liver    Food allergy    Walnuts   GAD (generalized anxiety disorder)    GERD (gastroesophageal reflux disease)    High cholesterol    no current med.   History of kidney stones    History of migraine headaches    Hypertension    under control with meds., has been on med. x 4 yr.   Hypothyroidism    IBS (irritable bowel syndrome)    Infertility, female    Joint pain    Lichen planus    Liver cirrhosis secondary to NASH (nonalcoholic steatohepatitis) (HCC)    NASH (nonalcoholic steatohepatitis)    Osteoarthritis    Other fatigue    Palpitations    PCOS (polycystic ovarian syndrome)    PONV (postoperative nausea and vomiting)    also hx. of emergence delirium 2007   Shortness of breath on exertion    Sleep apnea    uses CPAP nightly   Trigeminal neuralgia    Type 2 diabetes mellitus (HCC)    Vitamin D deficiency     Past Medical History, Surgical history, Social history, and Family history were reviewed and updated as appropriate.   Please see review of systems for further details on the  patient's review from today.   Objective:   Physical Exam:   LMP 08/24/2020   Physical Exam  Lab Review:     Component Value Date/Time   NA 133 (L) 03/22/2021 0000   NA 140 10/11/2020 1041   K 4.3 03/22/2021 0000   CL 98 03/22/2021 0000   CO2 23 03/22/2021 0000   GLUCOSE 342 (H) 03/22/2021 0000   BUN 11 03/22/2021 0000   BUN 14 10/11/2020 1041   CREATININE 0.85 03/22/2021 0000   CALCIUM 9.7 03/22/2021 0000   PROT 7.0 02/11/2021 1516   PROT 7.1 10/11/2020 1041   ALBUMIN 3.8 02/11/2021 1516   ALBUMIN 4.2 10/11/2020 1041   AST 38 02/11/2021 1516   ALT 35 02/11/2021 1516   ALKPHOS 172 (H) 02/11/2021 1516   BILITOT 0.1 (L) 02/11/2021 1516   BILITOT <0.2 10/11/2020 1041   GFRNONAA >60 02/11/2021 1516   GFRNONAA 99 12/26/2020 0000   GFRAA 115 12/26/2020 0000       Component Value Date/Time   WBC 4.4 02/14/2021 0000   RBC 4.67 02/14/2021 0000   HGB 11.9 02/14/2021 0000   HGB 10.9 (L) 10/11/2020 1041   HCT 38.4 02/14/2021 0000   HCT 35.6 10/11/2020 1041   PLT 171 02/14/2021 0000   PLT 224 10/11/2020 1041   MCV 82.2 02/14/2021 0000   MCV 78 (L) 10/11/2020 1041   MCH 25.5 (L) 02/14/2021 0000   MCHC 31.0 (L) 02/14/2021 0000   RDW 20.0 (H) 02/14/2021 0000   RDW 17.5 (H) 10/11/2020 1041   LYMPHSABS 1,448 02/14/2021 0000   LYMPHSABS 1.7 10/11/2020 1041   MONOABS 0.3 02/11/2021 1516   EOSABS 141 02/14/2021 0000   EOSABS 0.2 10/11/2020 1041   BASOSABS 22 02/14/2021 0000   BASOSABS 0.0 10/11/2020 1041    No results found for: POCLITH, LITHIUM   No results found for: PHENYTOIN, PHENOBARB, VALPROATE, CBMZ   .res Assessment: Plan:    Tolerated 4rd Spravato treatment 60m- return in 2 days.   There are no diagnoses linked to this encounter.    Please see After Visit Summary for patient specific instructions    Please see After Visit Summary for patient specific instructions.  Future Appointments  Date Time Provider [place] 08/01/2021  9:00 AM CP-NURSE CP-CP None  08/01/2021  9:00 AM [place], RBerdie Ogren NP  CP-CP None  08/02/2021  8:50 AM MLuetta Nutting DO PCK-PCK None  08/03/2021  9:00 AM CP-NURSE CP-CP None  08/03/2021  9:00 AM [place], RBerdie Ogren NP CP-CP None  08/15/2021  4:00 PM Revankar, RReita Cliche MD CVD-HIGHPT None    No orders of the defined types were placed in this encounter.   -------------------------------

## 2021-07-30 ENCOUNTER — Other Ambulatory Visit: Payer: Self-pay

## 2021-07-30 NOTE — Telephone Encounter (Signed)
Pt aware Rollene Fare is out of office and will discuss with her at the apt on 12/28

## 2021-07-31 ENCOUNTER — Other Ambulatory Visit (HOSPITAL_COMMUNITY): Payer: Self-pay

## 2021-08-01 ENCOUNTER — Encounter: Payer: Self-pay | Admitting: Adult Health

## 2021-08-01 ENCOUNTER — Other Ambulatory Visit: Payer: Self-pay

## 2021-08-01 ENCOUNTER — Other Ambulatory Visit (HOSPITAL_COMMUNITY): Payer: Self-pay

## 2021-08-01 ENCOUNTER — Ambulatory Visit (INDEPENDENT_AMBULATORY_CARE_PROVIDER_SITE_OTHER): Payer: 59 | Admitting: Adult Health

## 2021-08-01 ENCOUNTER — Ambulatory Visit: Payer: 59

## 2021-08-01 ENCOUNTER — Telehealth: Payer: Self-pay | Admitting: Adult Health

## 2021-08-01 VITALS — BP 118/71 | HR 86

## 2021-08-01 DIAGNOSIS — F411 Generalized anxiety disorder: Secondary | ICD-10-CM

## 2021-08-01 DIAGNOSIS — F4310 Post-traumatic stress disorder, unspecified: Secondary | ICD-10-CM

## 2021-08-01 DIAGNOSIS — F329 Major depressive disorder, single episode, unspecified: Secondary | ICD-10-CM

## 2021-08-01 DIAGNOSIS — F410 Panic disorder [episodic paroxysmal anxiety] without agoraphobia: Secondary | ICD-10-CM | POA: Diagnosis not present

## 2021-08-01 MED ORDER — CLONAZEPAM 1 MG PO TABS
1.0000 mg | ORAL_TABLET | Freq: Three times a day (TID) | ORAL | 2 refills | Status: DC
  Filled 2021-08-01: qty 90, 30d supply, fill #0
  Filled 2021-08-27: qty 90, 30d supply, fill #1

## 2021-08-01 MED ORDER — CLONAZEPAM 1 MG PO TABS
1.0000 mg | ORAL_TABLET | Freq: Three times a day (TID) | ORAL | 5 refills | Status: DC
  Filled 2021-08-01: qty 30, 10d supply, fill #0

## 2021-08-01 MED ORDER — ARMODAFINIL 200 MG PO TABS
ORAL_TABLET | ORAL | 2 refills | Status: DC
  Filled 2021-08-01: qty 30, 30d supply, fill #0
  Filled 2021-10-31: qty 30, 30d supply, fill #1
  Filled 2021-12-26: qty 30, 30d supply, fill #2

## 2021-08-01 NOTE — Telephone Encounter (Signed)
Milena at the [facility] says the prior auth from the insurance rejected the 28-day supply.  They can only accept 14day supply for Spravato, so they need a new prior auth sent for 14 days.  Next appt 12/30

## 2021-08-01 NOTE — Progress Notes (Signed)
Tracy [name] [id number] 09-13-1965 55 y.o.  Subjective:   Patient ID:  Tracy [name] is a 55 y.o. (DOB 09-16-1965) female.  Chief Complaint: No chief complaint on file.   HPI Tracy [name] presents to the office today for follow-up for Spravato treatment - 5.   Patient was administered Spravato 84 mg intranasally today. She was observed by provider throughout [facility] treatment. The patient experienced the typical dissociation which gradually resolved over the 2-hour period of observation.There were no complications. Specifically, the patient did not have any untoward side effects - feeling disconnected from herself, her thoughts, feelings and things around her, dizziness, nausea, feeling sleepy, decreased feeling of sensitivity (numbness)spinning sensation, feeling anxious, lack of energy, increased blood pressure, nausea, vomiting, feeling drunk, or feeling happy or very excited, nausea or vomiting or headache. She was however tearful for a period of time - which resolved before session end. Blood pressures remained within normal ranges at the 40-minute and 2-hour follow-up intervals. By the time the 2-hour observation period was met the patient was alert and oriented and able to exit without assistance. 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 See nursing note for further details.      [place] Office Visit from 03/14/2021 in [place] Office Visit from 02/14/2021 in [place] Office Visit from 03/01/2020 in [place]  Total GAD-7 Score 17 12 11       PHQ2-9    [place] Office Visit from 03/14/2021 in [place] Office Visit from 02/14/2021 in [place] Visit from 10/11/2020 in [place] Office Visit from 03/01/2020 in [place] Visit from 06/16/2019 in [place] [facility]  PHQ-2 Total Score 6 6 6 3 2   PHQ-9 Total Score 19 21 19 9 9       Flowsheet Row ED from 02/11/2021 in [place] ED from 02/10/2021 in [place] [facility] Admission (Discharged) from 01/10/2021 in [place] No Risk No Risk No Risk        Review of Systems:  Review of Systems  Musculoskeletal:  Negative for gait problem.  Neurological:  Negative for tremors.  Psychiatric/Behavioral:         Please refer to HPI   Medications: I have reviewed the patient's current medications.  Current Outpatient Medications  Medication Sig Dispense Refill   folic acid (FOLVITE) 1 MG tablet Take 1 tablet (1 mg total) by mouth daily. 30 tablet 0   Semaglutide (RYBELSUS) 14 MG TABS Take 1 tablet by mouth daily. 30 tablet 0   AMBULATORY NON FORMULARY MEDICATION Convertible/stand-up workstation such as "[employer]." 1 each 0   Armodafinil 200 MG TABS Take 1 tablet by mouth as needed for sleepiness 30 tablet 2   benzonatate (TESSALON) 100 MG capsule Take 1 capsule (100 mg total) by mouth 3 (three) times daily as needed. 30 capsule 0   Blood Glucose Monitoring Suppl (FREESTYLE LITE) w/Device KIT USE TO CHECK BLOOD SUGAR ONCE A DAY 1 kit 0   Blood Glucose Monitoring Suppl (FREESTYLE LITE) w/Device KIT USE TO CHECK BLOOD SUGAR ONCE A DAY 1 kit 0   clonazePAM (KLONOPIN) 1 MG  tablet Take 1 tablet by mouth 3 times daily. 90 tablet 2   Continuous Blood Gluc Receiver (DEXCOM G6 RECEIVER) DEVI Use to monitor continuous blood glucose. 1 each 0   Continuous Blood Gluc Sensor (DEXCOM G6 SENSOR) MISC Replace every 10 days.  Use to check blood sugar. 9 each 1   Continuous Blood Gluc Transmit (DEXCOM G6 TRANSMITTER) MISC Use to check continuous blood  sugar.  Replace every 90 days 1 each 3   COVID-19 mRNA bivalent vaccine, Pfizer, (PFIZER COVID-19 VAC BIVALENT) injection Inject into the muscle. 0.3 mL 0   cyclobenzaprine (FLEXERIL) 10 MG tablet TAKE 1 TABLET BY MOUTH 2 TIMES DAILY AS NEEDED FOR MUSCLE SPASMS 20 tablet 0   Esketamine HCl, 56 MG Dose, (SPRAVATO, 56 MG DOSE,) 28 MG/DEVICE SOPK Administer 56 mg ( 2 of the 28 mg) intranasally as directed. 2 each 0   Esketamine HCl, 84 MG Dose, (SPRAVATO, 84 MG DOSE,) 28 MG/DEVICE SOPK Administer 51m intranasally twice weekly. To be administered under the supervision of a healthcare professional. 9 each 0   famotidine (PEPCID) 40 MG tablet Take 1 tablet by mouth 2  times daily. 180 tablet 1   ferrous sulfate 325 (65 FE) MG EC tablet Take 1 tablet (325 mg total) by mouth in the morning and at bedtime. Take iron supplement 2 hours before or 4 hours after PPI or other antacids as this requires some level of gastric acidity to aid in absorption 60 tablet 5   furosemide (LASIX) 40 MG tablet TAKE 1 TABLET BY MOUTH DAILY. 30 tablet 3   glucose blood (FREESTYLE LITE) test strip Use as instructed 100 each 12   glucose blood test strip USE TO CHECK BLOOD SUGAR ONCE A DAY (Patient taking differently: USE TO CHECK BLOOD SUGAR ONCE A DAY) 100 strip 12   hydrOXYzine (ATARAX/VISTARIL) 25 MG tablet Take 1 tablet (25 mg total) by mouth 3 (three) times daily as needed. (Patient taking differently: Take 25 mg by mouth at bedtime as needed (sleep).) 90 tablet 2   insulin degludec (TRESIBA FLEXTOUCH) 100 UNIT/ML FlexTouch Pen Inject 30 Units into the skin 2 times daily. 54 mL 3   insulin lispro (HUMALOG KWIKPEN) 100 UNIT/ML KwikPen Inject up to 16 units 3 times daily at mealtime per sliding scale. 15 mL 11   Insulin Pen Needle (UNIFINE PENTIPS) 31G X 5 MM MISC Use as directed to inject insulin 100 each 3   lamoTRIgine (LAMICTAL) 200 MG tablet Take 2 tablets by mouth at bedtime. 180 tablet 0   Lancets (FREESTYLE) lancets USE  TO TEST BLOOD SUGAR ONCE A DAY (Patient taking differently: USE TO TEST BLOOD SUGAR ONCE A DAY) 100 each 0   lidocaine (XYLOCAINE) 5 % ointment APPLY 1 G TOPICALLY 3 (THREE) TIMES DAILY BEFORE MEALS (DOESNT COME IN GEL, FAXED DR) (Patient taking differently: Apply 1 application topically 3 (three) times daily as needed (oral sores).) 1063.2 g 2   lisinopril (ZESTRIL) 10 MG tablet Take 1 tablet (10 mg total) by mouth daily. 90 tablet 3   meloxicam (MOBIC) 15 MG tablet Take one tab by mouth every morning with a meal for 2 weeks, then daily as needed for pain. 30 tablet 0   NUVIGIL 200 MG TABS Take 1 tablet by mouth daily as needed (sleepiness). 30 tablet 2   nystatin (MYCOSTATIN/NYSTOP) powder Apply 3 times daily as directed. (Patient taking differently: Apply 1 application topically daily.) 60 g 3   ondansetron (ZOFRAN-ODT) 4 MG disintegrating tablet Take  4 mg by mouth 2 (two) times daily as needed for nausea or vomiting.     oseltamivir (TAMIFLU) 75 MG capsule Take 1 capsule (75 mg total) by mouth 2 (two) times daily. 10 capsule 0   pimecrolimus (ELIDEL) 1 % cream APPLY TOPICALLY 2 TIMES DAILY TO COMPLETELY DRY AREA PLACE A THIN LAYER ONTO THE MOST SORE AREAS PRIOR TO BED. DO NOT RINSE (Patient taking differently: Apply 1 application topically 2 (two) times daily as needed (mouth sores).) 60 g 0   RABEprazole (ACIPHEX) 20 MG tablet Take 1 tablet (20 mg total) by mouth in the morning and at bedtime. 180 tablet 1   rizatriptan (MAXALT) 10 MG tablet Take 1 tablet (10 mg total) by mouth as needed for migraine. May repeat in 2 hours if needed (Patient taking differently: Take 10 mg by mouth every 2 (two) hours as needed for migraine. May repeat in 2 hours if needed) 10 tablet 0   rosuvastatin (CRESTOR) 20 MG tablet TAKE 1 TABLET BY MOUTH DAILY. 90 tablet 3   sertraline (ZOLOFT) 100 MG tablet TAKE 2 TABLETS BY MOUTH EVERY MORNING 180 tablet 3   spironolactone (ALDACTONE) 50 MG tablet Take 1 tablet (50 mg  total) by mouth 2 (two) times daily. 60 tablet 2   sucralfate (CARAFATE) 1 g tablet Take 1 tablet (1 g total) by mouth 4 (four) times daily -  with meals and at bedtime for 15 days. 60 tablet 1   SYNTHROID 300 MCG tablet TAKE 1 TABLET BY MOUTH ONCE A DAY BEFORE BREAKFAST 90 tablet 1   Vitamin D, Ergocalciferol, (DRISDOL) 1.25 MG (50000 UNIT) CAPS capsule TAKE 1 CAPSULE BY MOUTH EVERY 7 DAYS. 12 capsule 1   No current facility-administered medications for this visit.    Medication Side Effects: None  Allergies:  Allergies  Allergen Reactions   Sulfa Antibiotics Itching, Swelling and Rash   Food     Walnut-mouth swelling/itching   Bupropion Hcl Itching and Rash   Codeine Itching   Sulfamethoxazole-Trimethoprim Rash    Past Medical History:  Diagnosis Date   Anxiety    Arthritis    back, knees, right elbow   Back pain    Bilateral swelling of feet and ankles    Bipolar disorder (HCC)    Borderline personality disorder ([place])    Cancer (HCC)    oral   Chewing difficulty    Cirrhosis (HCC)    Constipation    Dental crowns present    Depression    Diabetes mellitus without complication ([place])    Fatty liver    Food allergy    Walnuts   GAD (generalized anxiety disorder)    GERD (gastroesophageal reflux disease)    High cholesterol    no current med.   History of kidney stones    History of migraine headaches    Hypertension    under control with meds., has been on med. x 4 yr.   Hypothyroidism    IBS (irritable bowel syndrome)    Infertility, female    Joint pain    Lichen planus    Liver cirrhosis secondary to NASH (nonalcoholic steatohepatitis) (HCC)    NASH (nonalcoholic steatohepatitis)    Osteoarthritis    Other fatigue    Palpitations    PCOS (polycystic ovarian syndrome)    PONV (postoperative nausea and vomiting)    also hx. of emergence delirium 2007   Shortness of breath on exertion    Sleep apnea  uses CPAP nightly   Trigeminal neuralgia    Type  2 diabetes mellitus (HCC)    Vitamin D deficiency     Past Medical History, Surgical history, Social history, and Family history were reviewed and updated as appropriate.   Please see review of systems for further details on the patient's review from today.   Objective:   Physical Exam:  LMP 08/24/2020   Physical Exam Constitutional:      General: She is not in acute distress. Musculoskeletal:        General: No deformity.  Neurological:     Mental Status: She is alert and oriented to person, place, and time.     Coordination: Coordination normal.  Psychiatric:        Attention and Perception: Attention and perception normal. She does not perceive auditory or visual hallucinations.        Mood and Affect: Mood normal. Mood is not anxious or depressed. Affect is not labile, blunt, angry or inappropriate.        Speech: Speech normal.        Behavior: Behavior normal.        Thought Content: Thought content normal. Thought content is not paranoid or delusional. Thought content does not include homicidal or suicidal ideation. Thought content does not include homicidal or suicidal plan.        Cognition and Memory: Cognition and memory normal.        Judgment: Judgment normal.     Comments: Insight intact    Lab Review:     Component Value Date/Time   NA 133 (L) 03/22/2021 0000   NA 140 10/11/2020 1041   K 4.3 03/22/2021 0000   CL 98 03/22/2021 0000   CO2 23 03/22/2021 0000   GLUCOSE 342 (H) 03/22/2021 0000   BUN 11 03/22/2021 0000   BUN 14 10/11/2020 1041   CREATININE 0.85 03/22/2021 0000   CALCIUM 9.7 03/22/2021 0000   PROT 7.0 02/11/2021 1516   PROT 7.1 10/11/2020 1041   ALBUMIN 3.8 02/11/2021 1516   ALBUMIN 4.2 10/11/2020 1041   AST 38 02/11/2021 1516   ALT 35 02/11/2021 1516   ALKPHOS 172 (H) 02/11/2021 1516   BILITOT 0.1 (L) 02/11/2021 1516   BILITOT <0.2 10/11/2020 1041   GFRNONAA >60 02/11/2021 1516   GFRNONAA 99 12/26/2020 0000   GFRAA 115 12/26/2020 0000        Component Value Date/Time   WBC 4.4 02/14/2021 0000   RBC 4.67 02/14/2021 0000   HGB 11.9 02/14/2021 0000   HGB 10.9 (L) 10/11/2020 1041   HCT 38.4 02/14/2021 0000   HCT 35.6 10/11/2020 1041   PLT 171 02/14/2021 0000   PLT 224 10/11/2020 1041   MCV 82.2 02/14/2021 0000   MCV 78 (L) 10/11/2020 1041   MCH 25.5 (L) 02/14/2021 0000   MCHC 31.0 (L) 02/14/2021 0000   RDW 20.0 (H) 02/14/2021 0000   RDW 17.5 (H) 10/11/2020 1041   LYMPHSABS 1,448 02/14/2021 0000   LYMPHSABS 1.7 10/11/2020 1041   MONOABS 0.3 02/11/2021 1516   EOSABS 141 02/14/2021 0000   EOSABS 0.2 10/11/2020 1041   BASOSABS 22 02/14/2021 0000   BASOSABS 0.0 10/11/2020 1041    No results found for: POCLITH, LITHIUM   No results found for: PHENYTOIN, PHENOBARB, VALPROATE, CBMZ   .res Assessment: Plan:    Plan:  Continue Spravato treatment - next dose 39m.  [facility]  Diagnoses and all orders for this visit:  Treatment-resistant depression  GAD (generalized anxiety disorder)  PTSD (post-traumatic stress disorder)  Panic attacks -     clonazePAM (KLONOPIN) 1 MG tablet; Take 1 tablet by mouth 3 times daily.     Please see After Visit Summary for patient specific instructions.  Future Appointments  Date Time Provider [place]  08/02/2021  8:50 AM Luetta Nutting, DO PCK-PCK None  08/03/2021  9:00 AM CP-NURSE CP-CP None  08/03/2021  9:00 AM Trayson Stitely, Berdie Ogren, NP CP-CP None  08/15/2021  4:00 PM Revankar, Reita Cliche, MD [facility]-HIGHPT None    No orders of the defined types were placed in this encounter.   -------------------------------

## 2021-08-01 NOTE — Telephone Encounter (Signed)
Noted I've been contacted 3 times.

## 2021-08-01 NOTE — Telephone Encounter (Signed)
Forwarded message to [place].

## 2021-08-02 ENCOUNTER — Encounter: Payer: Self-pay | Admitting: Family Medicine

## 2021-08-02 ENCOUNTER — Other Ambulatory Visit (HOSPITAL_COMMUNITY): Payer: Self-pay

## 2021-08-02 ENCOUNTER — Ambulatory Visit: Payer: 59 | Admitting: Family Medicine

## 2021-08-02 VITALS — BP 133/78 | HR 86 | Temp 97.8°F | Ht 70.0 in | Wt 300.0 lb

## 2021-08-02 DIAGNOSIS — I10 Essential (primary) hypertension: Secondary | ICD-10-CM | POA: Diagnosis not present

## 2021-08-02 DIAGNOSIS — E890 Postprocedural hypothyroidism: Secondary | ICD-10-CM | POA: Diagnosis not present

## 2021-08-02 DIAGNOSIS — K219 Gastro-esophageal reflux disease without esophagitis: Secondary | ICD-10-CM | POA: Diagnosis not present

## 2021-08-02 DIAGNOSIS — Z1231 Encounter for screening mammogram for malignant neoplasm of breast: Secondary | ICD-10-CM

## 2021-08-02 DIAGNOSIS — F329 Major depressive disorder, single episode, unspecified: Secondary | ICD-10-CM

## 2021-08-02 DIAGNOSIS — E1169 Type 2 diabetes mellitus with other specified complication: Secondary | ICD-10-CM

## 2021-08-02 DIAGNOSIS — E119 Type 2 diabetes mellitus without complications: Secondary | ICD-10-CM

## 2021-08-02 DIAGNOSIS — E669 Obesity, unspecified: Secondary | ICD-10-CM | POA: Diagnosis not present

## 2021-08-02 DIAGNOSIS — Z23 Encounter for immunization: Secondary | ICD-10-CM

## 2021-08-02 DIAGNOSIS — E785 Hyperlipidemia, unspecified: Secondary | ICD-10-CM | POA: Diagnosis not present

## 2021-08-02 DIAGNOSIS — Z124 Encounter for screening for malignant neoplasm of cervix: Secondary | ICD-10-CM | POA: Diagnosis not present

## 2021-08-02 MED ORDER — DEXLANSOPRAZOLE 60 MG PO CPDR
60.0000 mg | DELAYED_RELEASE_CAPSULE | Freq: Every day | ORAL | 3 refills | Status: DC
  Filled 2021-08-02: qty 30, 30d supply, fill #0

## 2021-08-02 NOTE — Assessment & Plan Note (Addendum)
Thyroid function has remained fairly stable.  Continue on current dose of levothyroxine.  Updating TSH today.

## 2021-08-02 NOTE — Assessment & Plan Note (Signed)
She has tried multiple PPIs without good control of her symptoms.  Starting Dexilant 60 mg daily.

## 2021-08-02 NOTE — Telephone Encounter (Signed)
Contacted insurance and they are unclear why this would be requested from [facility] since it's already approved. I believe there is some miscommunication.

## 2021-08-02 NOTE — Progress Notes (Signed)
Tracy [name] - 55 y.o. female MRN [id number]  Date of birth: Sep 29, 1965  Subjective Chief Complaint  Patient presents with   Follow-up    HPI , Tracy [name] is a 55 year old female here today for follow-up.  She reports that medical problems are doing okay however she continues to struggle with mental health issues.  She was recently started on Spravato by her psychiatrist.  Unfortunately she has not really noticed any change since starting this.   Blood pressure remains well controlled with current medications.  She has not been checking her blood sugars regularly.  She is having increased reflux symptoms.  She has tried multiple medications including omeprazole, Nexium, Protonix, Aciphex and Prevacid without good control of symptoms.  She is behind on some of her screenings.  She would like referral for GYN, mammogram and ophthalmology.  ROS:  A comprehensive ROS was completed and negative except as noted per HPI  Allergies  Allergen Reactions   Sulfa Antibiotics Itching, Swelling and Rash   Food     Walnut-mouth swelling/itching   Bupropion Hcl Itching and Rash   Codeine Itching   Sulfamethoxazole-Trimethoprim Rash    Past Medical History:  Diagnosis Date   Anxiety    Arthritis    back, knees, right elbow   Back pain    Bilateral swelling of feet and ankles    Bipolar disorder (HCC)    Borderline personality disorder ([place])    Cancer (HCC)    oral   Chewing difficulty    Cirrhosis (HCC)    Constipation    Dental crowns present    Depression    Diabetes mellitus without complication ([place])    Fatty liver    Food allergy    Walnuts   GAD (generalized anxiety disorder)    GERD (gastroesophageal reflux disease)    High cholesterol    no current med.   History of kidney stones    History of migraine headaches    Hypertension    under control with meds., has been on med. x 4 yr.   Hypothyroidism    IBS (irritable bowel syndrome)    Infertility, female    Joint  pain    Lichen planus    Liver cirrhosis secondary to NASH (nonalcoholic steatohepatitis) (HCC)    NASH (nonalcoholic steatohepatitis)    Osteoarthritis    Other fatigue    Palpitations    PCOS (polycystic ovarian syndrome)    PONV (postoperative nausea and vomiting)    also hx. of emergence delirium 2007   Shortness of breath on exertion    Sleep apnea    uses CPAP nightly   Trigeminal neuralgia    Type 2 diabetes mellitus ([place])    Vitamin D deficiency     Past Surgical History:  Procedure Laterality Date   ACHILLES TENDON SURGERY Left 2007   APPENDECTOMY     CARPAL TUNNEL RELEASE Right 01/12/2016   Procedure: RIGHT CARPAL TUNNEL RELEASE;  Surgeon: Roseanne Kaufman, MD;  Location: [place];  Service: Orthopedics;  Laterality: Right;   COLONOSCOPY WITH PROPOFOL N/A 04/20/2014   Procedure: COLONOSCOPY WITH PROPOFOL;  Surgeon: Arta Silence, MD;  Location: [facility];  Service: Endoscopy;  Laterality: N/A;   COLONOSCOPY WITH PROPOFOL N/A 01/10/2021   Procedure: COLONOSCOPY WITH PROPOFOL;  Surgeon: Lavena Bullion, DO;  Location: [facility];  Service: Gastroenterology;  Laterality: N/A;   ESOPHAGOGASTRODUODENOSCOPY (EGD) WITH PROPOFOL N/A 04/20/2014   Procedure: ESOPHAGOGASTRODUODENOSCOPY (EGD) WITH PROPOFOL;  Surgeon: Arta Silence, MD;  Location: [facility];  Service: Endoscopy;  Laterality: N/A;   ESOPHAGOGASTRODUODENOSCOPY (EGD) WITH PROPOFOL N/A 01/10/2021   Procedure: ESOPHAGOGASTRODUODENOSCOPY (EGD) WITH PROPOFOL;  Surgeon: Lavena Bullion, DO;  Location: [facility];  Service: Gastroenterology;  Laterality: N/A;   EXCISION HAGLUND'S DEFORMITY WITH ACHILLES TENDON REPAIR Right 02/25/2013   Procedure: RIGHT ACHILLES DEBRIDEMENT AND RECONSTRUCTION;  HAGLUND'S EXCISION; GASTROC RECESSION AND FLEXOR HALLUCIS LONGUS TRANSFER;  Surgeon: Wylene Simmer, MD;  Location: [place];  Service: Orthopedics;  Laterality: Right;   JOINT REPLACEMENT     hip    LUMBAR LAMINECTOMY     X 3   neck fusion     OVARIAN CYST SURGERY Left 1998   PILONIDAL CYST EXCISION  1994   RIGHT OOPHORECTOMY     STERIOD INJECTION Left 01/12/2016   Procedure: STEROID INJECTION LEFT WRIST;  Surgeon: Roseanne Kaufman, MD;  Location: [place];  Service: Orthopedics;  Laterality: Left;   TOTAL HIP ARTHROPLASTY Left 03/19/2019   Procedure: LEFT TOTAL HIP ARTHROPLASTY ANTERIOR APPROACH;  Surgeon: Mcarthur Rossetti, MD;  Location: [facility];  Service: Orthopedics;  Laterality: Left;   TOTAL THYROIDECTOMY  2003   UPPER GI ENDOSCOPY  01/19/2015    Social History   Socioeconomic History   Marital status: Divorced    Spouse name: Not on file   Number of children: 0   Years of education: 16   Highest education level: Not on file  Occupational History   Occupation: RN-Telephone Copy    Employer: [employer]    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   Drug use: No   Sexual activity: Not Currently    Partners: Male    Birth control/protection: Condom  Other Topics Concern   Not on file  Social History Narrative   RN - [employer]   Regular exercise: no   Caffeine use: 2 x daily   Right-handed.   Lives alone.   Social Determinants of Health   Financial Resource Strain: Not on file  Food Insecurity: Not on file  Transportation Needs: Not on file  Physical Activity: Not on file  Stress: Not on file  Social Connections: Not on file    Family History  Problem Relation Age of Onset   Heart disease Father    Depression Father     Heart attack Father    Sudden death Father    Anxiety disorder Father    Bipolar disorder Father    Obesity Father    Cancer Brother        THROAT cancer   Depression Brother    Anxiety disorder Brother    Depression Sister    Breast cancer Mother    Hypertension Mother    Cancer Mother    Depression Mother    Sleep apnea Mother    Obesity Mother    Depression Maternal Grandmother    Stroke Maternal Grandfather    Colon cancer Brother    Pancreatic  cancer Neg Hx    Stomach cancer Neg Hx    Liver disease Neg Hx     Health Maintenance  Topic Date Due   HIV Screening  Never done   Hepatitis C Screening  Never done   PAP SMEAR-Modifier  Never done   MAMMOGRAM  10/28/2017   OPHTHALMOLOGY EXAM  03/28/2020   Zoster Vaccines- Shingrix (2 of 2) 09/27/2021   HEMOGLOBIN A1C  10/23/2021   FOOT EXAM  03/14/2022   TETANUS/TDAP  01/03/2028   COLONOSCOPY (Pts 45-68yrs Insurance coverage will need to be confirmed)  01/11/2031   Pneumococcal Vaccine 16-58 Years old  Completed   INFLUENZA VACCINE  Completed   COVID-19 Vaccine  Completed   HPV VACCINES  Aged Out     ----------------------------------------------------------------------------------------------------------------------------------------------------------------------------------------------------------------- Physical Exam BP 133/78 (BP Location: Left Arm, Patient Position: Sitting, Cuff Size: Normal)    Pulse 86    Temp 97.8 F (36.6 C)    Ht 5\' 10"  (1.778 m)    Wt 300 lb (136.1 kg)    LMP 08/24/2020    SpO2 92%    BMI 43.05 kg/m   Physical Exam Constitutional:      Appearance: Normal appearance.  HENT:     Head: Normocephalic and atraumatic.  Eyes:     General: No scleral icterus. Cardiovascular:     Rate and Rhythm: Normal rate and regular rhythm.  Musculoskeletal:     Cervical back: Neck supple.  Neurological:     Mental Status: She is alert.  Psychiatric:     Comments: Depressed mood and affect.     ------------------------------------------------------------------------------------------------------------------------------------------------------------------------------------------------------------------- Assessment and Plan  Essential hypertension Her blood pressure remains well controlled at this time.  Recommend continuation of current medications for management of hypertension.  HYPOTHYROIDISM, POSTSURGICAL Thyroid function has remained fairly stable.  Continue on current dose of levothyroxine.  Updating TSH today.  Diabetes mellitus, type 2 Update A1c today.  Encouraged to monitor blood sugars and follow a reduced carbohydrate diet.  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  Gastroesophageal reflux disease She has tried multiple PPIs without good control of her symptoms.  Starting Dexilant 60 mg daily.   No orders of the defined types were placed in this encounter.   No follow-ups on file.    This visit occurred during the SARS-CoV-2 public health emergency.  Safety protocols were in place, including screening questions prior to the visit, additional usage of staff PPE, and extensive cleaning of exam room while observing appropriate contact time as indicated for disinfecting solutions.

## 2021-08-02 NOTE — Assessment & Plan Note (Signed)
Her blood pressure remains well controlled at this time.  Recommend continuation of current medications for management of hypertension.

## 2021-08-02 NOTE — Assessment & Plan Note (Signed)
Update A1c today.  Encouraged to monitor blood sugars and follow a reduced carbohydrate diet.

## 2021-08-02 NOTE — Assessment & Plan Note (Signed)
Managed by psychiatry 

## 2021-08-03 ENCOUNTER — Encounter: Payer: Self-pay | Admitting: Adult Health

## 2021-08-03 ENCOUNTER — Ambulatory Visit: Payer: 59

## 2021-08-03 ENCOUNTER — Ambulatory Visit (INDEPENDENT_AMBULATORY_CARE_PROVIDER_SITE_OTHER): Payer: 59 | Admitting: Adult Health

## 2021-08-03 ENCOUNTER — Other Ambulatory Visit: Payer: Self-pay

## 2021-08-03 VITALS — BP 94/50 | HR 71

## 2021-08-03 DIAGNOSIS — F329 Major depressive disorder, single episode, unspecified: Secondary | ICD-10-CM | POA: Diagnosis not present

## 2021-08-03 LAB — HEPATIC FUNCTION PANEL
AG Ratio: 1.3 (calc) (ref 1.0–2.5)
ALT: 26 U/L (ref 6–29)
AST: 26 U/L (ref 10–35)
Albumin: 4.1 g/dL (ref 3.6–5.1)
Alkaline phosphatase (APISO): 93 U/L (ref 37–153)
Bilirubin, Direct: 0.1 mg/dL (ref 0.0–0.2)
Globulin: 3.1 g/dL (calc) (ref 1.9–3.7)
Indirect Bilirubin: 0.2 mg/dL (calc) (ref 0.2–1.2)
Total Bilirubin: 0.3 mg/dL (ref 0.2–1.2)
Total Protein: 7.2 g/dL (ref 6.1–8.1)

## 2021-08-03 LAB — LIPID PANEL W/REFLEX DIRECT LDL
Cholesterol: 112 mg/dL (ref <200)
HDL: 33 mg/dL — ABNORMAL LOW (ref >=50)
LDL Cholesterol (Calc): 56 mg/dL (calc)
Non-HDL Cholesterol (Calc): 79 mg/dL (calc) (ref <130)
Total CHOL/HDL Ratio: 3.4 (calc) (ref <5.0)
Triglycerides: 146 mg/dL (ref <150)

## 2021-08-03 LAB — BASIC METABOLIC PANEL
BUN: 9 mg/dL (ref 7–25)
CO2: 24 mmol/L (ref 20–32)
Calcium: 9.4 mg/dL (ref 8.6–10.4)
Chloride: 104 mmol/L (ref 98–110)
Creat: 0.95 mg/dL (ref 0.50–1.03)
Glucose, Bld: 153 mg/dL — ABNORMAL HIGH (ref 65–99)
Potassium: 4.4 mmol/L (ref 3.5–5.3)
Sodium: 137 mmol/L (ref 135–146)

## 2021-08-03 LAB — HEMOGLOBIN A1C
Hgb A1c MFr Bld: 6 % of total Hgb — ABNORMAL HIGH (ref <5.7)
Mean Plasma Glucose: 126 mg/dL
eAG (mmol/L): 7 mmol/L

## 2021-08-03 LAB — TSH: TSH: 0.19 mIU/L — ABNORMAL LOW

## 2021-08-03 NOTE — Progress Notes (Signed)
Nurse visit:   Patient arrived for her 5th Spravato treatment, she was directed to the [facility] Treatment Room. Pt is being treated for Treatment Resistant Depression, pt will be receiving the 84 mg, maintenance dose. (3 of the 28 mg nasal sprays). Pt remains very down and depressed. Keeps asking when the medication will start working, encouraged her to give it time but she is just starting and has had an interruption in her treatment. Encouraged her to try and be positive instead feeling hopeless about it not working yet. She agreed. Pt's presentation was very flat, dull, depressed.    Pt's Spravato arrives from [employer] via [employer] and delivered to office, all Spravato medication is stored at doctors office per REMS/FDA guidelines. Required to be locked behind two doors. Medication is classified as CIII. Pt does not take medication home, it's administered at an approved [place]. Medication is always observed by nurse. Beginning vitals are at 9:10 AM, 112/69, 90. Advised pt to blow her nose and recline back 45 degrees in her chair while the nasal sprays are being administered, pt given 1st inhaler then received the next 2 with 5 minutes apart. Kept reminding pt to stay reclined during this time. Then the lights are turned off per pt request. Pt was crying very loudly in the room today for awhile, nurse went in to assess and pt reports she's very depressed today about the medication not working. Unable to reassure she just kept crying and said she would be alright, after nurse came out pt continued to cry but eventually stopped. 40 minute vitals are at 9:50 AM, 123/74, 81. Her discharge vitals are at 11:10 AM, 118/71, 86 Pt observed a total of 120 minutes per FDA/REMS protocol, pt was with nurse a total of 55 minutes of clinical assessment. Pt is scheduled again this Friday, December 30 th at 9 am.   LOT# [id number] EXP FEB 2024

## 2021-08-03 NOTE — Progress Notes (Signed)
Tracy [name] [id number] Sep 21, 1965 55 y.o.  Subjective:   Patient ID:  Tracy [name] is a 55 y.o. (DOB [birth year]) female.  Chief Complaint: No chief complaint on file.   HPI Tracy [name] presents to the office today for follow-up of Spravato treatment - 6.   Patient was administered Spravato 84 mg intranasally today. She was observed by provider throughout [facility] treatment. The patient experienced the typical dissociation which gradually resolved over the 2-hour period of observation.There were no complications. Specifically, the patient did not have any untoward side effects - feeling disconnected from herself, her thoughts, feelings and things around her, dizziness, nausea, feeling sleepy, decreased feeling of sensitivity (numbness)spinning sensation, feeling anxious, lack of energy, increased blood pressure, nausea, vomiting, feeling drunk, or feeling happy or very excited, nausea or vomiting or headache. Blood pressures remained within normal ranges at the 40-minute and 2-hour follow-up intervals. By the time the 2-hour observation period was met the patient was alert and oriented and able to exit without assistance. 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 See nursing note for further details.    Previous medication trials:  Antipsychotics: Vraylar-TD, Latuda, Abilify, Geodon, Rexulti, Lybalvi  Mood Stabilizers - Depakote, Lithium, Lamictal, Equetro, Topamax, Trileptal, Gabapentin  SSRI - Zoloft, Lexapro, Celexa, Prozac, Viibryd, Trintellix, Prozac  SNRI - Pristiq, Effexor, Cymbalta  Wellbutrin - allergic reaction  Anti-anxiety - Buspar, Xanax, Clonazepam, Ativan, Valium  Sleep agents - Trazadone, Ambien, Restoril   Other - Deplin, Emsam, Serzone, Doxepin, Nuvigil, Vistaril  Previous treatment: ECT and [place]     GAD-7    Flowsheet Row Office Visit from 03/14/2021 in  [place] Office Visit from 02/14/2021 in [place] Office Visit from 03/01/2020 in [place]  Total GAD-7 Score _0 PHQ2-9    [place] Office Visit from 03/14/2021 in [place] Office Visit from 02/14/2021 in [place] Office Visit from 10/11/2020 in [place] Office Visit from 03/01/2020 in [place] Visit from 06/16/2019 in [place] at [facility] Total Score _1 PHQ-9 Total Score _2 Flowsheet Row ED from 02/11/2021 in [place] ED from 02/10/2021 in [place] [facility] Admission (Discharged) from 01/10/2021 in [place] No Risk No Risk No Risk        Review of Systems:  Review of Systems  Musculoskeletal:  Negative for gait problem.  Neurological:  Negative for tremors.  Psychiatric/Behavioral:         Please refer to HPI   Medications: I have reviewed the patient's current medications.  Current Outpatient Medications  Medication Sig Dispense Refill   AMBULATORY NON FORMULARY MEDICATION Convertible/stand-up workstation such as "[employer]." 1 each 0   Armodafinil 200 MG TABS Take 1 tablet by mouth as needed for sleepiness 30 tablet 2   benzonatate (TESSALON) 100 MG capsule Take 1 capsule (100 mg total) by mouth 3 (three) times daily as needed. 30 capsule 0   Blood Glucose Monitoring Suppl (FREESTYLE LITE) w/Device KIT USE TO CHECK BLOOD SUGAR ONCE A DAY 1 kit 0  Blood Glucose Monitoring Suppl (FREESTYLE LITE) w/Device KIT USE TO CHECK BLOOD SUGAR ONCE A DAY 1 kit 0   clonazePAM (KLONOPIN) 1 MG tablet Take 1 tablet by mouth 3 times daily. 90 tablet 2   Continuous Blood Gluc  Receiver (DEXCOM G6 RECEIVER) DEVI Use to monitor continuous blood glucose. 1 each 0   Continuous Blood Gluc Sensor (DEXCOM G6 SENSOR) MISC Replace every 10 days.  Use to check blood sugar. 9 each 1   Continuous Blood Gluc Transmit (DEXCOM G6 TRANSMITTER) MISC Use to check continuous blood sugar.  Replace every 90 days 1 each 3   COVID-19 mRNA bivalent vaccine, Pfizer, (PFIZER COVID-19 VAC BIVALENT) injection Inject into the muscle. 0.3 mL 0   cyclobenzaprine (FLEXERIL) 10 MG tablet TAKE 1 TABLET BY MOUTH 2 TIMES DAILY AS NEEDED FOR MUSCLE SPASMS 20 tablet 0   dexlansoprazole (DEXILANT) 60 MG capsule Take 1 capsule (60 mg total) by mouth daily. 30 capsule 3   Esketamine HCl, 56 MG Dose, (SPRAVATO, 56 MG DOSE,) 28 MG/DEVICE SOPK Administer 56 mg ( 2 of the 28 mg) intranasally as directed. 2 each 0   Esketamine HCl, 84 MG Dose, (SPRAVATO, 84 MG DOSE,) 28 MG/DEVICE SOPK Administer 78m intranasally twice weekly. To be administered under the supervision of a healthcare professional. 9 each 0   ferrous sulfate 325 (65 FE) MG EC tablet Take 1 tablet (325 mg total) by mouth in the morning and at bedtime. Take iron supplement 2 hours before or 4 hours after PPI or other antacids as this requires some level of gastric acidity to aid in absorption 60 tablet 5   folic acid (FOLVITE) 1 MG tablet Take 1 tablet (1 mg total) by mouth daily. 30 tablet 0   furosemide (LASIX) 40 MG tablet TAKE 1 TABLET BY MOUTH DAILY. 30 tablet 3   glucose blood (FREESTYLE LITE) test strip Use as instructed 100 each 12   glucose blood test strip USE TO CHECK BLOOD SUGAR ONCE A DAY (Patient taking differently: USE TO CHECK BLOOD SUGAR ONCE A DAY) 100 strip 12   hydrOXYzine (ATARAX/VISTARIL) 25 MG tablet Take 1 tablet (25 mg total) by mouth 3 (three) times daily as needed. (Patient taking differently: Take 25 mg by mouth at bedtime as needed (sleep).) 90 tablet 2   insulin degludec (TRESIBA FLEXTOUCH) 100 UNIT/ML FlexTouch Pen Inject 30  Units into the skin 2 times daily. 54 mL 3   insulin lispro (HUMALOG KWIKPEN) 100 UNIT/ML KwikPen Inject up to 16 units 3 times daily at mealtime per sliding scale. 15 mL 11   Insulin Pen Needle (UNIFINE PENTIPS) 31G X 5 MM MISC Use as directed to inject insulin 100 each 3   lamoTRIgine (LAMICTAL) 200 MG tablet Take 2 tablets by mouth at bedtime. 180 tablet 0   Lancets (FREESTYLE) lancets USE TO TEST BLOOD SUGAR ONCE A DAY (Patient taking differently: USE TO TEST BLOOD SUGAR ONCE A DAY) 100 each 0   lidocaine (XYLOCAINE) 5 % ointment APPLY 1 G TOPICALLY 3 (THREE) TIMES DAILY BEFORE MEALS (DOESNT COME IN GEL, FAXED DR) (Patient taking differently: Apply 1 application topically 3 (three) times daily as needed (oral sores).) 1063.2 g 2   lisinopril (ZESTRIL) 10 MG tablet Take 1 tablet (10 mg total) by mouth daily. 90 tablet 3   meloxicam (MOBIC) 15 MG tablet Take one tab by mouth every morning with a meal for 2 weeks, then daily as needed for pain. 30 tablet 0   NUVIGIL 200  MG TABS Take 1 tablet by mouth daily as needed (sleepiness). 30 tablet 2   nystatin (MYCOSTATIN/NYSTOP) powder Apply 3 times daily as directed. (Patient taking differently: Apply 1 application topically daily.) 60 g 3   ondansetron (ZOFRAN-ODT) 4 MG disintegrating tablet Take 4 mg by mouth 2 (two) times daily as needed for nausea or vomiting.     oseltamivir (TAMIFLU) 75 MG capsule Take 1 capsule (75 mg total) by mouth 2 (two) times daily. 10 capsule 0   pimecrolimus (ELIDEL) 1 % cream APPLY TOPICALLY 2 TIMES DAILY TO COMPLETELY DRY AREA PLACE A THIN LAYER ONTO THE MOST SORE AREAS PRIOR TO BED. DO NOT RINSE (Patient taking differently: Apply 1 application topically 2 (two) times daily as needed (mouth sores).) 60 g 0   rizatriptan (MAXALT) 10 MG tablet Take 1 tablet (10 mg total) by mouth as needed for migraine. May repeat in 2 hours if needed (Patient taking differently: Take 10 mg by mouth every 2 (two) hours as needed for migraine.  May repeat in 2 hours if needed) 10 tablet 0   rosuvastatin (CRESTOR) 20 MG tablet TAKE 1 TABLET BY MOUTH DAILY. 90 tablet 3   Semaglutide (RYBELSUS) 14 MG TABS Take 1 tablet by mouth daily. 30 tablet 0   sertraline (ZOLOFT) 100 MG tablet TAKE 2 TABLETS BY MOUTH EVERY MORNING 180 tablet 3   spironolactone (ALDACTONE) 50 MG tablet Take 1 tablet (50 mg total) by mouth 2 (two) times daily. 60 tablet 2   sucralfate (CARAFATE) 1 g tablet Take 1 tablet (1 g total) by mouth 4 (four) times daily -  with meals and at bedtime for 15 days. 60 tablet 1   SYNTHROID 300 MCG tablet TAKE 1 TABLET BY MOUTH ONCE A DAY BEFORE BREAKFAST 90 tablet 1   Vitamin D, Ergocalciferol, (DRISDOL) 1.25 MG (50000 UNIT) CAPS capsule TAKE 1 CAPSULE BY MOUTH EVERY 7 DAYS. 12 capsule 1   No current facility-administered medications for this visit.    Medication Side Effects: None  Allergies:  Allergies  Allergen Reactions   Sulfa Antibiotics Itching, Swelling and Rash   Food     Walnut-mouth swelling/itching   Bupropion Hcl Itching and Rash   Codeine Itching   Sulfamethoxazole-Trimethoprim Rash    Past Medical History:  Diagnosis Date   Anxiety    Arthritis    back, knees, right elbow   Back pain    Bilateral swelling of feet and ankles    Bipolar disorder (HCC)    Borderline personality disorder ([place])    Cancer (HCC)    oral   Chewing difficulty    Cirrhosis (HCC)    Constipation    Dental crowns present    Depression    Diabetes mellitus without complication ([place])    Fatty liver    Food allergy    Walnuts   GAD (generalized anxiety disorder)    GERD (gastroesophageal reflux disease)    High cholesterol    no current med.   History of kidney stones    History of migraine headaches    Hypertension    under control with meds., has been on med. x 4 yr.   Hypothyroidism    IBS (irritable bowel syndrome)    Infertility, female    Joint pain    Lichen planus    Liver cirrhosis secondary to NASH  (nonalcoholic steatohepatitis) (HCC)    NASH (nonalcoholic steatohepatitis)    Osteoarthritis    Other fatigue    Palpitations  PCOS (polycystic ovarian syndrome)    PONV (postoperative nausea and vomiting)    also hx. of emergence delirium 2007   Shortness of breath on exertion    Sleep apnea    uses CPAP nightly   Trigeminal neuralgia    Type 2 diabetes mellitus ([place])    Vitamin D deficiency     Past Medical History, Surgical history, Social history, and Family history were reviewed and updated as appropriate.   Please see review of systems for further details on the patient's review from today.   Objective:   Physical Exam:  LMP 08/24/2020   Physical Exam Constitutional:      General: She is not in acute distress. Musculoskeletal:        General: No deformity.  Neurological:     Mental Status: She is alert and oriented to person, place, and time.     Coordination: Coordination normal.  Psychiatric:        Attention and Perception: Attention and perception normal. She does not perceive auditory or visual hallucinations.        Mood and Affect: Mood normal. Mood is not anxious or depressed. Affect is not labile, blunt, angry or inappropriate.        Speech: Speech normal.        Behavior: Behavior normal.        Thought Content: Thought content normal. Thought content is not paranoid or delusional. Thought content does not include homicidal or suicidal ideation. Thought content does not include homicidal or suicidal plan.        Cognition and Memory: Cognition and memory normal.        Judgment: Judgment normal.     Comments: Insight intact    Lab Review:     Component Value Date/Time   NA 137 08/02/2021 0000   NA 140 10/11/2020 1041   K 4.4 08/02/2021 0000   CL 104 08/02/2021 0000   CO2 24 08/02/2021 0000   GLUCOSE 153 (H) 08/02/2021 0000   BUN 9 08/02/2021 0000   BUN 14 10/11/2020 1041   CREATININE 0.95 08/02/2021 0000   CALCIUM 9.4 08/02/2021 0000   PROT  7.2 08/02/2021 0000   PROT 7.1 10/11/2020 1041   ALBUMIN 3.8 02/11/2021 1516   ALBUMIN 4.2 10/11/2020 1041   AST 26 08/02/2021 0000   ALT 26 08/02/2021 0000   ALKPHOS 172 (H) 02/11/2021 1516   BILITOT 0.3 08/02/2021 0000   BILITOT <0.2 10/11/2020 1041   GFRNONAA >60 02/11/2021 1516   GFRNONAA 99 12/26/2020 0000   GFRAA 115 12/26/2020 0000       Component Value Date/Time   WBC 4.4 02/14/2021 0000   RBC 4.67 02/14/2021 0000   HGB 11.9 02/14/2021 0000   HGB 10.9 (L) 10/11/2020 1041   HCT 38.4 02/14/2021 0000   HCT 35.6 10/11/2020 1041   PLT 171 02/14/2021 0000   PLT 224 10/11/2020 1041   MCV 82.2 02/14/2021 0000   MCV 78 (L) 10/11/2020 1041   MCH 25.5 (L) 02/14/2021 0000   MCHC 31.0 (L) 02/14/2021 0000   RDW 20.0 (H) 02/14/2021 0000   RDW 17.5 (H) 10/11/2020 1041   LYMPHSABS 1,448 02/14/2021 0000   LYMPHSABS 1.7 10/11/2020 1041   MONOABS 0.3 02/11/2021 1516   EOSABS 141 02/14/2021 0000   EOSABS 0.2 10/11/2020 1041   BASOSABS 22 02/14/2021 0000   BASOSABS 0.0 10/11/2020 1041    No results found for: POCLITH, LITHIUM   No results found for: PHENYTOIN, PHENOBARB, VALPROATE,  CBMZ   .res Assessment: Plan:     Plan:  Continue Spravato treatment   Continue therapy - Cathy Showfety.  Medications: Lamictal 283m daily Zoloft 1077m- 2 daily Nuvigil 20063maily  Clonazepam 1mg67mnax XR 0.5mg 39mdroxyzine 25mg 62m- using 1 tablet at bedtime.   Diagnoses and all orders for this visit:  Treatment-resistant depression     Please see After Visit Summary for patient specific instructions.  Future Appointments  Date Time Provider DepartMetolius2023 10:00 AM MKV- MM 1 [facility]V-MM [facility]  08/15/2021  4:00 PM Revankar, Rajan Reita ClicheVD-[facility]IGH[facility] None  11/29/2021 11:10 AM MattheLuetta NuttingCK-PCK None    No orders of the defined types were placed in this encounter.   -------------------------------

## 2021-08-08 ENCOUNTER — Other Ambulatory Visit: Payer: Self-pay

## 2021-08-08 ENCOUNTER — Encounter: Payer: Self-pay | Admitting: Adult Health

## 2021-08-08 ENCOUNTER — Ambulatory Visit: Payer: 59

## 2021-08-08 ENCOUNTER — Ambulatory Visit (INDEPENDENT_AMBULATORY_CARE_PROVIDER_SITE_OTHER): Payer: 59 | Admitting: Adult Health

## 2021-08-08 VITALS — BP 110/57 | HR 82

## 2021-08-08 DIAGNOSIS — F329 Major depressive disorder, single episode, unspecified: Secondary | ICD-10-CM | POA: Diagnosis not present

## 2021-08-08 NOTE — Progress Notes (Signed)
Nurse visit:   Patient arrived for her 50th Spravato treatment, she was directed to the [facility] Treatment Room. Pt is being treated for Treatment Resistant Depression, pt will be receiving the 84 mg, maintenance dose. (3 of the 28 mg nasal sprays).     Pt's Spravato arrives from [employer] via [employer] and delivered to office, all Spravato medication is stored at doctors office per REMS/FDA guidelines. Required to be locked behind two doors. Medication is classified as CIII. Pt does not take medication home, it's administered at an approved [place]. Medication is always observed by nurse. Beginning vitals are at 9:05 AM, 119/71, 80. Advised pt to blow her nose and recline back 45 degrees in her chair while the nasal sprays are being administered, pt given 1st inhaler then received the next 2 with 5 minutes apart. Then the lights are turned off per pt request.  40 minute vitals are at 9:45 AM, 113/73, 78. Her discharge vitals are at 11:05 AM, 94/50, 77 Pt observed a total of 120 minutes per FDA/REMS protocol, pt was with nurse a total of 55 minutes of clinical assessment. Pt is scheduled next Wednesday, January 4th.      LOT# [id number] EXP FEB 2025

## 2021-08-08 NOTE — Progress Notes (Signed)
Tracy [name] [id number] Feb 24, 1966 56 y.o.  Subjective:   Patient ID:  Tracy [name] is a 56 y.o. (DOB [birth year]) female.  Chief Complaint: No chief complaint on file.   HPI DORINNE [name] presents to the office today for Spravato treatment - 7.  Patient was administered Spravato 84 mg intranasally today. She was observed by provider throughout [facility] treatment. The patient experienced the typical dissociation which gradually resolved over the 2-hour period of observation.There were no complications. Specifically, the patient did not have any untoward side effects - feeling disconnected from herself, her thoughts, feelings and things around her, dizziness, nausea, feeling sleepy, decreased feeling of sensitivity (numbness)spinning sensation, feeling anxious, lack of energy, increased blood pressure, nausea, vomiting, feeling drunk, or feeling happy or very excited, nausea or vomiting or headache. Blood pressures remained within normal ranges at the 40-minute and 2-hour follow-up intervals. By the time the 2-hour observation period was met the patient was pleasant, alert and oriented and able to exit without assistance. 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 See nursing note for further details. Will return in 2 days for Spravato treatment - 8.  Previous medication trials:  Antipsychotics: Vraylar-TD, Latuda, Abilify, Geodon, Rexulti, Lybalvi  Mood Stabilizers - Depakote, Lithium, Lamictal, Equetro, Topamax, Trileptal, Gabapentin  SSRI - Zoloft, Lexapro, Celexa, Prozac, Viibryd, Trintellix, Prozac  SNRI - Pristiq, Effexor, Cymbalta  Wellbutrin - allergic reaction  Anti-anxiety - Buspar, Xanax, Clonazepam, Ativan, Valium  Sleep agents - Trazadone, Ambien, Restoril   Other - Deplin, Emsam, Serzone, Doxepin, Nuvigil, Vistaril  Previous treatment: ECT and [place]    GAD-7     Flowsheet Row Office Visit from 03/14/2021 in [place] Office Visit from 02/14/2021 in [place] Office Visit from 03/01/2020 in [place]  Total GAD-7 Score _0 PHQ2-9    [place] Office Visit from 03/14/2021 in [place] Office Visit from 02/14/2021 in [facility] Office Visit from 10/11/2020 in [place] Office Visit from 03/01/2020 in [place] Visit from 06/16/2019 in [place] at [facility] Total Score _1 PHQ-9 Total Score _2 Flowsheet Row ED from 02/11/2021 in [place] [facility] from 02/10/2021 in [facility] Admission (Discharged) from 01/10/2021 in [place] No Risk No Risk No Risk        Review of Systems:  Review of Systems  Musculoskeletal:  Negative for gait problem.  Neurological:  Negative for tremors.  Psychiatric/Behavioral:         Please refer to HPI   Medications: I have reviewed the patient's current medications.  Current Outpatient Medications  Medication Sig Dispense Refill   AMBULATORY NON FORMULARY MEDICATION Convertible/stand-up workstation such as "[employer]." 1 each 0   Armodafinil 200 MG TABS Take 1 tablet by mouth as needed for sleepiness 30 tablet 2   benzonatate (TESSALON) 100 MG capsule Take 1 capsule (100 mg total) by mouth 3 (three) times daily as needed. 30 capsule 0   Blood Glucose Monitoring Suppl (FREESTYLE LITE) w/Device KIT USE TO CHECK BLOOD SUGAR ONCE A DAY  1 kit 0   Blood Glucose Monitoring Suppl (FREESTYLE LITE) w/Device KIT USE TO CHECK BLOOD SUGAR ONCE A DAY 1 kit 0   clonazePAM (KLONOPIN) 1 MG tablet Take 1 tablet by mouth 3 times  daily. 90 tablet 2   Continuous Blood Gluc Receiver (DEXCOM G6 RECEIVER) DEVI Use to monitor continuous blood glucose. 1 each 0   Continuous Blood Gluc Sensor (DEXCOM G6 SENSOR) MISC Replace every 10 days.  Use to check blood sugar. 9 each 1   Continuous Blood Gluc Transmit (DEXCOM G6 TRANSMITTER) MISC Use to check continuous blood sugar.  Replace every 90 days 1 each 3   COVID-19 mRNA bivalent vaccine, Pfizer, (PFIZER COVID-19 VAC BIVALENT) injection Inject into the muscle. 0.3 mL 0   cyclobenzaprine (FLEXERIL) 10 MG tablet TAKE 1 TABLET BY MOUTH 2 TIMES DAILY AS NEEDED FOR MUSCLE SPASMS 20 tablet 0   dexlansoprazole (DEXILANT) 60 MG capsule Take 1 capsule (60 mg total) by mouth daily. 30 capsule 3   Esketamine HCl, 56 MG Dose, (SPRAVATO, 56 MG DOSE,) 28 MG/DEVICE SOPK Administer 56 mg ( 2 of the 28 mg) intranasally as directed. 2 each 0   Esketamine HCl, 84 MG Dose, (SPRAVATO, 84 MG DOSE,) 28 MG/DEVICE SOPK Administer 63m intranasally twice weekly. To be administered under the supervision of a healthcare professional. 9 each 0   ferrous sulfate 325 (65 FE) MG EC tablet Take 1 tablet (325 mg total) by mouth in the morning and at bedtime. Take iron supplement 2 hours before or 4 hours after PPI or other antacids as this requires some level of gastric acidity to aid in absorption 60 tablet 5   folic acid (FOLVITE) 1 MG tablet Take 1 tablet (1 mg total) by mouth daily. 30 tablet 0   furosemide (LASIX) 40 MG tablet TAKE 1 TABLET BY MOUTH DAILY. 30 tablet 3   glucose blood (FREESTYLE LITE) test strip Use as instructed 100 each 12   glucose blood test strip USE TO CHECK BLOOD SUGAR ONCE A DAY (Patient taking differently: USE TO CHECK BLOOD SUGAR ONCE A DAY) 100 strip 12   hydrOXYzine (ATARAX/VISTARIL) 25 MG tablet Take 1 tablet (25 mg total) by mouth 3 (three) times daily as needed. (Patient taking differently: Take 25 mg by mouth at bedtime as needed (sleep).) 90 tablet 2   insulin degludec (TRESIBA  FLEXTOUCH) 100 UNIT/ML FlexTouch Pen Inject 30 Units into the skin 2 times daily. 54 mL 3   insulin lispro (HUMALOG KWIKPEN) 100 UNIT/ML KwikPen Inject up to 16 units 3 times daily at mealtime per sliding scale. 15 mL 11   Insulin Pen Needle (UNIFINE PENTIPS) 31G X 5 MM MISC Use as directed to inject insulin 100 each 3   lamoTRIgine (LAMICTAL) 200 MG tablet Take 2 tablets by mouth at bedtime. 180 tablet 0   Lancets (FREESTYLE) lancets USE TO TEST BLOOD SUGAR ONCE A DAY (Patient taking differently: USE TO TEST BLOOD SUGAR ONCE A DAY) 100 each 0   lidocaine (XYLOCAINE) 5 % ointment APPLY 1 G TOPICALLY 3 (THREE) TIMES DAILY BEFORE MEALS (DOESNT COME IN GEL, FAXED DR) (Patient taking differently: Apply 1 application topically 3 (three) times daily as needed (oral sores).) 1063.2 g 2   lisinopril (ZESTRIL) 10 MG tablet Take 1 tablet (10 mg total) by mouth daily. 90 tablet 3   meloxicam (MOBIC) 15 MG tablet Take one tab by mouth every morning with a meal for 2 weeks, then daily as needed for pain. 30 tablet  0   NUVIGIL 200 MG TABS Take 1 tablet by mouth daily as needed (sleepiness). 30 tablet 2   nystatin (MYCOSTATIN/NYSTOP) powder Apply 3 times daily as directed. (Patient taking differently: Apply 1 application topically daily.) 60 g 3   ondansetron (ZOFRAN-ODT) 4 MG disintegrating tablet Take 4 mg by mouth 2 (two) times daily as needed for nausea or vomiting.     oseltamivir (TAMIFLU) 75 MG capsule Take 1 capsule (75 mg total) by mouth 2 (two) times daily. 10 capsule 0   pimecrolimus (ELIDEL) 1 % cream APPLY TOPICALLY 2 TIMES DAILY TO COMPLETELY DRY AREA PLACE A THIN LAYER ONTO THE MOST SORE AREAS PRIOR TO BED. DO NOT RINSE (Patient taking differently: Apply 1 application topically 2 (two) times daily as needed (mouth sores).) 60 g 0   rizatriptan (MAXALT) 10 MG tablet Take 1 tablet (10 mg total) by mouth as needed for migraine. May repeat in 2 hours if needed (Patient taking differently: Take 10 mg by  mouth every 2 (two) hours as needed for migraine. May repeat in 2 hours if needed) 10 tablet 0   rosuvastatin (CRESTOR) 20 MG tablet TAKE 1 TABLET BY MOUTH DAILY. 90 tablet 3   Semaglutide (RYBELSUS) 14 MG TABS Take 1 tablet by mouth daily. 30 tablet 0   sertraline (ZOLOFT) 100 MG tablet TAKE 2 TABLETS BY MOUTH EVERY MORNING 180 tablet 3   spironolactone (ALDACTONE) 50 MG tablet Take 1 tablet (50 mg total) by mouth 2 (two) times daily. 60 tablet 2   sucralfate (CARAFATE) 1 g tablet Take 1 tablet (1 g total) by mouth 4 (four) times daily -  with meals and at bedtime for 15 days. 60 tablet 1   SYNTHROID 300 MCG tablet TAKE 1 TABLET BY MOUTH ONCE A DAY BEFORE BREAKFAST 90 tablet 1   Vitamin D, Ergocalciferol, (DRISDOL) 1.25 MG (50000 UNIT) CAPS capsule TAKE 1 CAPSULE BY MOUTH EVERY 7 DAYS. 12 capsule 1   No current facility-administered medications for this visit.    Medication Side Effects: None  Allergies:  Allergies  Allergen Reactions   Sulfa Antibiotics Itching, Swelling and Rash   Food     Walnut-mouth swelling/itching   Bupropion Hcl Itching and Rash   Codeine Itching   Sulfamethoxazole-Trimethoprim Rash    Past Medical History:  Diagnosis Date   Anxiety    Arthritis    back, knees, right elbow   Back pain    Bilateral swelling of feet and ankles    Bipolar disorder (HCC)    Borderline personality disorder ([place])    Cancer (HCC)    oral   Chewing difficulty    Cirrhosis (HCC)    Constipation    Dental crowns present    Depression    Diabetes mellitus without complication ([place])    Fatty liver    Food allergy    Walnuts   GAD (generalized anxiety disorder)    GERD (gastroesophageal reflux disease)    High cholesterol    no current med.   History of kidney stones    History of migraine headaches    Hypertension    under control with meds., has been on med. x 4 yr.   Hypothyroidism    IBS (irritable bowel syndrome)    Infertility, female    Joint pain    Lichen  planus    Liver cirrhosis secondary to NASH (nonalcoholic steatohepatitis) (HCC)    NASH (nonalcoholic steatohepatitis)    Osteoarthritis    Other fatigue  Palpitations    PCOS (polycystic ovarian syndrome)    PONV (postoperative nausea and vomiting)    also hx. of emergence delirium 2007   Shortness of breath on exertion    Sleep apnea    uses CPAP nightly   Trigeminal neuralgia    Type 2 diabetes mellitus ([place])    Vitamin D deficiency     Past Medical History, Surgical history, Social history, and Family history were reviewed and updated as appropriate.   Please see review of systems for further details on the patient's review from today.   Objective:   Physical Exam:  LMP 08/24/2020   Physical Exam Constitutional:      General: She is not in acute distress. Musculoskeletal:        General: No deformity.  Neurological:     Mental Status: She is alert and oriented to person, place, and time.     Coordination: Coordination normal.  Psychiatric:        Attention and Perception: Attention and perception normal. She does not perceive auditory or visual hallucinations.        Mood and Affect: Mood normal. Mood is not anxious or depressed. Affect is not labile, blunt, angry or inappropriate.        Speech: Speech normal.        Behavior: Behavior normal.        Thought Content: Thought content normal. Thought content is not paranoid or delusional. Thought content does not include homicidal or suicidal ideation. Thought content does not include homicidal or suicidal plan.        Cognition and Memory: Cognition and memory normal.        Judgment: Judgment normal.     Comments: Insight intact    Lab Review:     Component Value Date/Time   NA 137 08/02/2021 0000   NA 140 10/11/2020 1041   K 4.4 08/02/2021 0000   CL 104 08/02/2021 0000   CO2 24 08/02/2021 0000   GLUCOSE 153 (H) 08/02/2021 0000   BUN 9 08/02/2021 0000   BUN 14 10/11/2020 1041   CREATININE 0.95  08/02/2021 0000   CALCIUM 9.4 08/02/2021 0000   PROT 7.2 08/02/2021 0000   PROT 7.1 10/11/2020 1041   ALBUMIN 3.8 02/11/2021 1516   ALBUMIN 4.2 10/11/2020 1041   AST 26 08/02/2021 0000   ALT 26 08/02/2021 0000   ALKPHOS 172 (H) 02/11/2021 1516   BILITOT 0.3 08/02/2021 0000   BILITOT <0.2 10/11/2020 1041   GFRNONAA >60 02/11/2021 1516   GFRNONAA 99 12/26/2020 0000   GFRAA 115 12/26/2020 0000       Component Value Date/Time   WBC 4.4 02/14/2021 0000   RBC 4.67 02/14/2021 0000   HGB 11.9 02/14/2021 0000   HGB 10.9 (L) 10/11/2020 1041   HCT 38.4 02/14/2021 0000   HCT 35.6 10/11/2020 1041   PLT 171 02/14/2021 0000   PLT 224 10/11/2020 1041   MCV 82.2 02/14/2021 0000   MCV 78 (L) 10/11/2020 1041   MCH 25.5 (L) 02/14/2021 0000   MCHC 31.0 (L) 02/14/2021 0000   RDW 20.0 (H) 02/14/2021 0000   RDW 17.5 (H) 10/11/2020 1041   LYMPHSABS 1,448 02/14/2021 0000   LYMPHSABS 1.7 10/11/2020 1041   MONOABS 0.3 02/11/2021 1516   EOSABS 141 02/14/2021 0000   EOSABS 0.2 10/11/2020 1041   BASOSABS 22 02/14/2021 0000   BASOSABS 0.0 10/11/2020 1041    No results found for: POCLITH, LITHIUM   No results found  for: PHENYTOIN, PHENOBARB, VALPROATE, CBMZ   .res Assessment: Plan:    Plan:  Continue Spravato treatment   Continue therapy - Cathy Showfety.  Medications: Lamictal 261m daily Zoloft 1018m- 2 daily Nuvigil 20047maily  Clonazepam 1mg25mnax XR 0.5mg 77mdroxyzine 25mg 59m- using 1 tablet at bedtime.   Diagnoses and all orders for this visit:  Treatment-resistant depression     Please see After Visit Summary for patient specific instructions.  Future Appointments  Date Time Provider DepartStan[place]2023 10:00 AM MKV- MM 1 MKV-MM [facility]  08/10/2021  9:00 AM CP-NURSE CP-CP None  08/10/2021  9:00 AM Alizah Sills, ReginaBerdie OgrenP-CP None  08/15/2021  4:00 PM Revankar, Rajan Reita ClicheVD-HIGHPT None  08/23/2021 11:20 AM Kasiah Manka, ReginaBerdie OgrenP-CP None   11/29/2021 11:10 AM MattheLuetta NuttingCK-PCK None    No orders of the defined types were placed in this encounter.   -------------------------------

## 2021-08-09 ENCOUNTER — Other Ambulatory Visit: Payer: Self-pay

## 2021-08-09 ENCOUNTER — Ambulatory Visit (INDEPENDENT_AMBULATORY_CARE_PROVIDER_SITE_OTHER): Payer: 59

## 2021-08-09 DIAGNOSIS — C801 Malignant (primary) neoplasm, unspecified: Secondary | ICD-10-CM | POA: Insufficient documentation

## 2021-08-09 DIAGNOSIS — Z9889 Other specified postprocedural states: Secondary | ICD-10-CM | POA: Insufficient documentation

## 2021-08-09 DIAGNOSIS — E119 Type 2 diabetes mellitus without complications: Secondary | ICD-10-CM | POA: Insufficient documentation

## 2021-08-09 DIAGNOSIS — R5383 Other fatigue: Secondary | ICD-10-CM | POA: Insufficient documentation

## 2021-08-09 DIAGNOSIS — K7460 Unspecified cirrhosis of liver: Secondary | ICD-10-CM | POA: Insufficient documentation

## 2021-08-09 DIAGNOSIS — R112 Nausea with vomiting, unspecified: Secondary | ICD-10-CM | POA: Insufficient documentation

## 2021-08-09 DIAGNOSIS — M2550 Pain in unspecified joint: Secondary | ICD-10-CM | POA: Insufficient documentation

## 2021-08-09 DIAGNOSIS — Z1231 Encounter for screening mammogram for malignant neoplasm of breast: Secondary | ICD-10-CM

## 2021-08-09 DIAGNOSIS — K0889 Other specified disorders of teeth and supporting structures: Secondary | ICD-10-CM | POA: Insufficient documentation

## 2021-08-09 DIAGNOSIS — K589 Irritable bowel syndrome without diarrhea: Secondary | ICD-10-CM | POA: Insufficient documentation

## 2021-08-09 DIAGNOSIS — E039 Hypothyroidism, unspecified: Secondary | ICD-10-CM | POA: Insufficient documentation

## 2021-08-09 DIAGNOSIS — Z91018 Allergy to other foods: Secondary | ICD-10-CM | POA: Insufficient documentation

## 2021-08-09 DIAGNOSIS — Z8669 Personal history of other diseases of the nervous system and sense organs: Secondary | ICD-10-CM | POA: Insufficient documentation

## 2021-08-09 DIAGNOSIS — Z98811 Dental restoration status: Secondary | ICD-10-CM | POA: Insufficient documentation

## 2021-08-09 DIAGNOSIS — L439 Lichen planus, unspecified: Secondary | ICD-10-CM | POA: Insufficient documentation

## 2021-08-09 DIAGNOSIS — M1990 Unspecified osteoarthritis, unspecified site: Secondary | ICD-10-CM | POA: Insufficient documentation

## 2021-08-09 DIAGNOSIS — K5900 Constipation, unspecified: Secondary | ICD-10-CM | POA: Insufficient documentation

## 2021-08-09 DIAGNOSIS — F339 Major depressive disorder, recurrent, unspecified: Secondary | ICD-10-CM

## 2021-08-09 DIAGNOSIS — Z87442 Personal history of urinary calculi: Secondary | ICD-10-CM | POA: Insufficient documentation

## 2021-08-09 DIAGNOSIS — M25475 Effusion, left foot: Secondary | ICD-10-CM | POA: Insufficient documentation

## 2021-08-09 DIAGNOSIS — R0602 Shortness of breath: Secondary | ICD-10-CM | POA: Insufficient documentation

## 2021-08-09 DIAGNOSIS — K7581 Nonalcoholic steatohepatitis (NASH): Secondary | ICD-10-CM | POA: Insufficient documentation

## 2021-08-09 DIAGNOSIS — M25471 Effusion, right ankle: Secondary | ICD-10-CM | POA: Insufficient documentation

## 2021-08-09 MED ORDER — SPRAVATO (84 MG DOSE) 28 MG/DEVICE NA SOPK: PACK | NASAL | 5 refills | Status: DC

## 2021-08-09 NOTE — Telephone Encounter (Signed)
Noted  

## 2021-08-10 ENCOUNTER — Ambulatory Visit: Payer: 59

## 2021-08-10 ENCOUNTER — Other Ambulatory Visit: Payer: Self-pay

## 2021-08-10 ENCOUNTER — Encounter: Payer: Self-pay | Admitting: Adult Health

## 2021-08-10 ENCOUNTER — Ambulatory Visit: Payer: 59 | Admitting: Adult Health

## 2021-08-10 ENCOUNTER — Encounter: Payer: Self-pay | Admitting: Family Medicine

## 2021-08-10 VITALS — BP 102/51 | HR 77

## 2021-08-10 DIAGNOSIS — F329 Major depressive disorder, single episode, unspecified: Secondary | ICD-10-CM

## 2021-08-10 NOTE — Progress Notes (Signed)
Tracy [name] [id number] May 22, 1966 56 y.o.  Subjective:   Patient ID:  Tracy [name] is a 56 y.o. (DOB 1966-02-02) female.  Chief Complaint: No chief complaint on file.   HPI Tracy [name] presents to the office today for follow-up of Spravato treatment - 8.  Patient was administered Spravato 84 mg intranasally today. She was observed by provider throughout [facility] treatment. The patient experienced the typical dissociation which gradually resolved over the 2-hour period of observation.There were no complications. Specifically, the patient did not have any untoward side effects - feeling disconnected from herself, her thoughts, feelings and things around her, dizziness, nausea, feeling sleepy, decreased feeling of sensitivity (numbness)spinning sensation, feeling anxious, lack of energy, increased blood pressure, nausea, vomiting, feeling drunk, or feeling happy or very excited, nausea or vomiting or headache. Blood pressures remained within normal ranges at the 40-minute and 2-hour follow-up intervals. By the time the 2-hour observation period was met the patient was pleasant, alert and oriented and able to exit without assistance. Patient would like to continue the treatment. See nursing note for further details. Will return on 08/13/2021 for Spravato treatment - 9.  Previous medication trials:  Antipsychotics: Vraylar-TD, Latuda, Abilify, Geodon, Rexulti, Lybalvi  Mood Stabilizers - Depakote, Lithium, Lamictal, Equetro, Topamax, Trileptal, Gabapentin  SSRI - Zoloft, Lexapro, Celexa, Prozac, Viibryd, Trintellix, Prozac  SNRI - Pristiq, Effexor, Cymbalta  Wellbutrin - allergic reaction  Anti-anxiety - Buspar, Xanax, Clonazepam, Ativan, Valium  Sleep agents - Trazadone, Ambien, Restoril   Other - Deplin, Emsam, Serzone, Doxepin, Nuvigil, Vistaril  Previous treatment: ECT and [place]     GAD-7    Flowsheet Row Office Visit from 03/14/2021 in [place] Office Visit from 02/14/2021 in [place] Office Visit from 03/01/2020 in [place]  Total GAD-7 Score _0 PHQ2-9    [place] Office Visit from 03/14/2021 in [place] Office Visit from 02/14/2021 in [place] Office Visit from 10/11/2020 in [place] Office Visit from 03/01/2020 in [place] Visit from 06/16/2019 in [place] [facility] Total Score _1 PHQ-9 Total Score _2 Flowsheet Row ED from 02/11/2021 in [place] ED from 02/10/2021 in [place] [facility] Admission (Discharged) from 01/10/2021 in [place] No Risk No Risk No Risk        Review of Systems:  Review of Systems  Musculoskeletal:  Negative for gait problem.  Neurological:  Negative for tremors.  Psychiatric/Behavioral:         Please refer to HPI   Medications: I have reviewed the patient's current medications.  Current Outpatient Medications  Medication Sig Dispense Refill   Armodafinil 200 MG TABS Take 1 tablet by mouth as needed for sleepiness 30 tablet 2   benzonatate (TESSALON) 100 MG capsule Take by mouth 3 (three) times daily as needed for cough.     clonazePAM (KLONOPIN) 1 MG tablet Take 1 tablet by mouth 3 times daily. 90 tablet 2   Continuous Blood Gluc Sensor (DEXCOM G6 SENSOR) MISC Replace every 10 days.  Use to check blood sugar. 9 each 1   Continuous Blood Gluc  Transmit (DEXCOM G6 TRANSMITTER) MISC Use to check continuous blood sugar.  Replace every 90 days 1 each 3   cyclobenzaprine (FLEXERIL) 10 MG tablet TAKE 1 TABLET BY MOUTH 2 TIMES DAILY AS NEEDED FOR MUSCLE SPASMS 20 tablet 0   dexlansoprazole (DEXILANT) 60  MG capsule Take 1 capsule (60 mg total) by mouth daily. 30 capsule 3   Esketamine HCl, 56 MG Dose, (SPRAVATO, 56 MG DOSE,) 28 MG/DEVICE SOPK Administer 56 mg ( 2 of the 28 mg) intranasally as directed. 2 each 0   Esketamine HCl, 84 MG Dose, (SPRAVATO, 84 MG DOSE,) 28 MG/DEVICE SOPK Administer 84m intranasally once a week.  To be administered under the supervision of a healthcare professional. 6 each 5   ferrous sulfate 325 (65 FE) MG EC tablet Take 1 tablet (325 mg total) by mouth in the morning and at bedtime. Take iron supplement 2 hours before or 4 hours after PPI or other antacids as this requires some level of gastric acidity to aid in absorption 60 tablet 5   folic acid (FOLVITE) 1 MG tablet Take 1 tablet (1 mg total) by mouth daily. 30 tablet 0   furosemide (LASIX) 40 MG tablet TAKE 1 TABLET BY MOUTH DAILY. 30 tablet 3   glucose blood (FREESTYLE LITE) test strip Use as instructed 100 each 12   hydrOXYzine (ATARAX) 25 MG tablet Take 25 mg by mouth 3 (three) times daily as needed for anxiety.     insulin degludec (TRESIBA FLEXTOUCH) 100 UNIT/ML FlexTouch Pen Inject 30 Units into the skin 2 times daily. 54 mL 3   insulin lispro (HUMALOG KWIKPEN) 100 UNIT/ML KwikPen Inject up to 16 units 3 times daily at mealtime per sliding scale. 15 mL 11   Insulin Pen Needle (UNIFINE PENTIPS) 31G X 5 MM MISC Use as directed to inject insulin 100 each 3   lamoTRIgine (LAMICTAL) 200 MG tablet Take 2 tablets by mouth at bedtime. 180 tablet 0   lidocaine (XYLOCAINE) 5 % ointment Apply 1 application topically 3 (three) times daily as needed for mouth pain. Or sores     lisinopril (ZESTRIL) 10 MG tablet Take 1 tablet (10 mg total) by mouth daily. 90 tablet 3   meloxicam (MOBIC) 15 MG tablet Take one tab by mouth every morning with a meal for 2 weeks, then daily as needed for pain. 30 tablet 0   NUVIGIL 200 MG TABS Take 1 tablet by mouth daily as needed (sleepiness). 30 tablet 2   nystatin (MYCOSTATIN/NYSTOP) powder  Apply 3 times daily as directed. (Patient taking differently: Apply 1 application topically daily.) 60 g 3   ondansetron (ZOFRAN-ODT) 4 MG disintegrating tablet Take 4 mg by mouth 2 (two) times daily as needed for nausea or vomiting.     rizatriptan (MAXALT) 10 MG tablet Take 1 tablet (10 mg total) by mouth as needed for migraine. May repeat in 2 hours if needed (Patient taking differently: Take 10 mg by mouth every 2 (two) hours as needed for migraine. May repeat in 2 hours if needed) 10 tablet 0   rosuvastatin (CRESTOR) 20 MG tablet TAKE 1 TABLET BY MOUTH DAILY. 90 tablet 3   Semaglutide (RYBELSUS) 14 MG TABS Take 1 tablet by mouth daily. 30 tablet 0   sertraline (ZOLOFT) 100 MG tablet TAKE 2 TABLETS BY MOUTH EVERY MORNING 180 tablet 3   spironolactone (ALDACTONE) 50 MG tablet Take 1 tablet (50 mg total) by mouth 2 (two) times daily. 60 tablet 2   sucralfate (CARAFATE)  1 g tablet Take 1 g by mouth 4 (four) times daily.     SYNTHROID 300 MCG tablet TAKE 1 TABLET BY MOUTH ONCE A DAY BEFORE BREAKFAST 90 tablet 1   Vitamin D, Ergocalciferol, (DRISDOL) 1.25 MG (50000 UNIT) CAPS capsule TAKE 1 CAPSULE BY MOUTH EVERY 7 DAYS. 12 capsule 1   No current facility-administered medications for this visit.    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   Carpal tunnel syndrome 09/06/2015   Chewing difficulty    Chronic left shoulder pain 05/17/2020   Constipation     Coronary artery disease 03/21/2021   Cystic teratoma    BENIGN   Dental crowns present    Diabetes mellitus without complication ([place])    Diabetes mellitus, type 2 ([place]) 06/10/2013   Diarrhea    Diverticulosis of colon without hemorrhage    DOE (dyspnea on exertion) 03/21/2021   Dyspepsia 12/14/2020   Essential hypertension 10/02/2007   Qualifier: Diagnosis of  By: Loanne Drilling MD, Sean A    Facial rash 06/16/2019   Female hirsutism 02/09/2015   Food allergy    Walnuts   GAD (generalized anxiety disorder)    Gastritis and gastroduodenitis    Gastroesophageal reflux disease    Grade II internal hemorrhoids    Greater trochanteric bursitis of left hip 07/24/2018   Hemorrhoids, internal 07/18/2017   History of kidney stones    History of migraine headaches    History of posttraumatic stress disorder (PTSD)    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/2015   Hypothyroidism    HYPOTHYROIDISM, POSTSURGICAL 10/02/2007   Qualifier: Diagnosis of  By: Loanne Drilling MD, Sean A    IBS (irritable bowel syndrome)    Infertility associated with anovulation 02/09/2015   Insulin resistance 02/09/2015   Joint pain    Leg swelling 0/62/3762   Lichen planus    Liver cirrhosis secondary to NASH (nonalcoholic steatohepatitis) ([place])    Lower back pain 07/18/2017   MDD (major depressive disorder) 06/06/2015   Migraine 10/02/2007   Morbid obesity with BMI of 40.0-44.9, adult ([place]) 06/23/2020   NASH (nonalcoholic steatohepatitis)    Nausea without vomiting    Nonalcoholic fatty liver disease 02/09/2015   Obstructive sleep apnea 06/27/2014   Oral mucosal lesion 06/17/2019   Osteoarthritis    Other fatigue    PCOS (polycystic ovarian syndrome)    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[place]) 01/01/2012   POLYCYSTIC OVARIAN DISEASE 10/02/2007   Qualifier: Diagnosis of  By: Loanne Drilling MD, Hilliard Clark A    PONV (postoperative nausea and vomiting)    also hx. 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   Please see review of systems for further details on the patient's review from today.   Objective:   Physical Exam:  LMP 08/24/2020   Physical Exam  Lab Review:     Component Value Date/Time   NA 137 08/02/2021 0000   NA 140 10/11/2020 1041   K 4.4 08/02/2021 0000   CL 104 08/02/2021 0000   CO2 24 08/02/2021 0000   GLUCOSE 153 (H) 08/02/2021 0000   BUN 9 08/02/2021 0000   BUN 14 10/11/2020 1041   CREATININE 0.95 08/02/2021 0000   CALCIUM 9.4 08/02/2021 0000   PROT 7.2 08/02/2021 0000   PROT 7.1 10/11/2020 1041   ALBUMIN 3.8 02/11/2021 1516   ALBUMIN 4.2 10/11/2020 1041   AST 26 08/02/2021 0000   ALT 26 08/02/2021 0000   ALKPHOS 172 (H) 02/11/2021 1516   BILITOT 0.3 08/02/2021 0000   BILITOT <0.2 10/11/2020 1041   GFRNONAA >60 02/11/2021 1516   GFRNONAA 99 12/26/2020 0000   GFRAA 115 12/26/2020 0000       Component Value Date/Time   WBC 4.4 02/14/2021 0000   RBC 4.67 02/14/2021 0000   HGB 11.9 02/14/2021 0000   HGB 10.9 (L) 10/11/2020 1041   HCT 38.4 02/14/2021 0000   HCT 35.6 10/11/2020 1041   PLT 171 02/14/2021 0000   PLT 224 10/11/2020 1041   MCV 82.2 02/14/2021 0000   MCV 78 (L) 10/11/2020 1041   MCH 25.5 (L) 02/14/2021 0000   MCHC 31.0 (L) 02/14/2021 0000   RDW 20.0 (H) 02/14/2021 0000   RDW 17.5 (H) 10/11/2020 1041   LYMPHSABS 1,448  02/14/2021 0000   LYMPHSABS 1.7 10/11/2020 1041   MONOABS 0.3 02/11/2021 1516   EOSABS 141 02/14/2021 0000   EOSABS 0.2 10/11/2020 1041   BASOSABS 22 02/14/2021 0000   BASOSABS 0.0 10/11/2020 1041    No results found for: POCLITH, LITHIUM   No results found for: PHENYTOIN, PHENOBARB, VALPROATE, CBMZ   .res Assessment: Plan:    Plan:  Continue Spravato treatment   Continue therapy - Cathy Showfety. Discussed DBT option with current therapist and she will look at potential options and possible group therapy.  Will reach out to therapist regarding DBT or referral.  Medications: Lamictal 269m daily Zoloft 1061m- 2 daily Nuvigil 20045maily  Clonazepam 1mg9mnax XR 0.5mg 30mdroxyzine 25mg 71m- using 1 tablet at bedtime.  Diagnoses and all orders for this visit:  Treatment-resistant depression     Please see After Visit Summary for patient specific instructions.  Future Appointments  Date Time Provider [place]/2023 11:20 AM Tyrann Donaho, ReginaBerdie OgrenP-CP None  11/15/2021  4:20 PM Revankar, Rajan Reita ClicheVD-HIGHPT None  11/29/2021 11:10 AM MattheLuetta NuttingCK-PCK None    No orders of the defined types were placed in this encounter.   -------------------------------

## 2021-08-14 ENCOUNTER — Other Ambulatory Visit: Payer: Self-pay

## 2021-08-14 DIAGNOSIS — F339 Major depressive disorder, recurrent, unspecified: Secondary | ICD-10-CM

## 2021-08-14 MED ORDER — SPRAVATO (84 MG DOSE) 28 MG/DEVICE NA SOPK: PACK | NASAL | 5 refills | Status: DC

## 2021-08-15 ENCOUNTER — Ambulatory Visit: Payer: 59 | Admitting: Cardiology

## 2021-08-15 ENCOUNTER — Other Ambulatory Visit: Payer: Self-pay

## 2021-08-15 ENCOUNTER — Other Ambulatory Visit (INDEPENDENT_AMBULATORY_CARE_PROVIDER_SITE_OTHER): Payer: Self-pay | Admitting: Family Medicine

## 2021-08-15 ENCOUNTER — Telehealth: Payer: Self-pay

## 2021-08-15 ENCOUNTER — Ambulatory Visit: Payer: 59

## 2021-08-15 ENCOUNTER — Ambulatory Visit: Payer: 59 | Admitting: Adult Health

## 2021-08-15 ENCOUNTER — Other Ambulatory Visit (HOSPITAL_COMMUNITY): Payer: Self-pay

## 2021-08-15 ENCOUNTER — Other Ambulatory Visit: Payer: Self-pay | Admitting: Family Medicine

## 2021-08-15 ENCOUNTER — Encounter: Payer: Self-pay | Admitting: Adult Health

## 2021-08-15 VITALS — BP 101/52 | HR 78

## 2021-08-15 DIAGNOSIS — E538 Deficiency of other specified B group vitamins: Secondary | ICD-10-CM

## 2021-08-15 DIAGNOSIS — F329 Major depressive disorder, single episode, unspecified: Secondary | ICD-10-CM

## 2021-08-15 DIAGNOSIS — E1165 Type 2 diabetes mellitus with hyperglycemia: Secondary | ICD-10-CM

## 2021-08-15 DIAGNOSIS — F339 Major depressive disorder, recurrent, unspecified: Secondary | ICD-10-CM

## 2021-08-15 MED ORDER — SPRAVATO (84 MG DOSE) 28 MG/DEVICE NA SOPK: PACK | NASAL | 5 refills | Status: DC

## 2021-08-15 NOTE — Telephone Encounter (Signed)
LAST APPOINTMENT DATE: 06/20/21 NEXT APPOINTMENT DATE: Not scheduled   [place] [address] Phone: [phone] Fax: [phone]  [place] [address] Phone: [phone] Fax: [phone]  [id number] - [place], [place] - [address] [place] [address] Phone: [phone] Fax: [phone]  Patient is requesting a refill of the following medications: Requested Prescriptions   Pending Prescriptions Disp Refills   folic acid (FOLVITE) 1 MG tablet 30 tablet 0    Sig: Take 1 tablet (1 mg total) by mouth daily.   Semaglutide (RYBELSUS) 14 MG TABS 30 tablet 0    Sig: Take 1 tablet by mouth daily.    Date last filled: 07/24/21 and 07/09/21 Previously prescribed by Dr. Leafy Ro  Lab Results  Component Value Date   HGBA1C 6.0 (H) 08/02/2021   HGBA1C 11.1 (A) 04/25/2021   HGBA1C 8.3 (H) 12/26/2020   Lab Results  Component Value Date   MICROALBUR 0.6 01/31/2017   LDLCALC 56 08/02/2021   CREATININE 0.95 08/02/2021   Lab Results  Component Value Date   VD25OH 14.0 (L) 10/11/2020   VD25OH 36 06/25/2018   VD25OH 22.91 (L) 03/25/2018    BP Readings from Last 3 Encounters:  08/02/21 133/78  06/20/21 99/67  05/23/21 107/69

## 2021-08-15 NOTE — Progress Notes (Signed)
Tracy [name] [id number] 08-Jul-1966 56 y.o.  Subjective:   Patient ID:  Tracy [name] is a 56 y.o. (DOB Nov 18, 1965) female.  Chief Complaint: No chief complaint on file.   HPI Tracy [name] presents to the office today for follow-up of Spravato treatment - 9.  Patient was administered Spravato 84 mg intranasally today. She was observed by provider throughout [facility] treatment. The patient experienced the typical dissociation which gradually resolved over the 2-hour period of observation.There were no complications. Specifically, the patient did not have any untoward side effects - feeling disconnected from herself, her thoughts, feelings and things around her, dizziness, nausea, feeling sleepy, decreased feeling of sensitivity (numbness)spinning sensation, feeling anxious, lack of energy, increased blood pressure, nausea, vomiting, feeling drunk, or feeling happy or very excited, nausea or vomiting or headache. Blood pressures remained within normal ranges at the 40-minute and 2-hour follow-up intervals. By the time the 2 - hour observation period was met the patient was alert and oriented and able to exit without assistance.   Patient reporting she has not felt the effects from treatment she was expecting thus far. With the "break" in consistent treatments over the holidays, she has agreed to continue 3 more treatments after today in hopes of a noted improvement. She was tearful prior to and and after treatment today. She does have an office visit planned to discuss current treatment and other potential options.  See nursing note for further details. Will return on 08/17/2021 for Spravato treatment - 10.  Previous medication trials:  Antipsychotics: Vraylar-TD, Latuda, Abilify, Geodon, Rexulti, Lybalvi  Mood Stabilizers - Depakote, Lithium, Lamictal, Equetro, Topamax, Trileptal, Gabapentin  SSRI - Zoloft, Lexapro, Celexa, Prozac, Viibryd, Trintellix, Prozac  SNRI - Pristiq,  Effexor, Cymbalta  Wellbutrin - allergic reaction  Anti-anxiety - Buspar, Xanax, Clonazepam, Ativan, Valium  Sleep agents - Trazadone, Ambien, Restoril   Other - Deplin, Emsam, Serzone, Doxepin, Nuvigil, Vistaril  Previous treatment: ECT and [place]    GAD-7    Flowsheet Row Office Visit from 03/14/2021 in [place] Office Visit from 02/14/2021 in [place] Office Visit from 03/01/2020 in [place]  Total GAD-7 Score _0 PHQ2-9    [place] Office Visit from 03/14/2021 in [place] Office Visit from 02/14/2021 in [place] Office Visit from 10/11/2020 in [place] Office Visit from 03/01/2020 in [place] Visit from 06/16/2019 in [place] at [facility] Total Score _1 PHQ-9 Total Score _2 Flowsheet Row ED from 02/11/2021 in [place] ED from 02/10/2021 in [place] [facility] Admission (Discharged) from 01/10/2021 in [place] No Risk No Risk No Risk        Review of Systems:  Review of Systems  Medications: I have reviewed the patient's current medications.  Current Outpatient Medications  Medication Sig Dispense Refill   Armodafinil 200 MG TABS Take 1 tablet by mouth as needed for sleepiness 30 tablet 2   benzonatate (TESSALON) 100 MG capsule Take by mouth 3 (three) times daily as needed for cough.     clonazePAM (KLONOPIN) 1 MG tablet Take 1 tablet by mouth 3  times daily. 90 tablet 2   Continuous Blood Gluc Sensor (DEXCOM G6 SENSOR) MISC Replace every 10 days.  Use to check blood sugar. 9 each 1   Continuous Blood Gluc Transmit (DEXCOM G6 TRANSMITTER) MISC Use to check  continuous blood sugar.  Replace every 90 days 1 each 3   cyclobenzaprine (FLEXERIL) 10 MG tablet TAKE 1 TABLET BY MOUTH 2 TIMES DAILY AS NEEDED FOR MUSCLE SPASMS 20 tablet 0   dexlansoprazole (DEXILANT) 60 MG capsule Take 1 capsule (60 mg total) by mouth daily. 30 capsule 3   Esketamine HCl, 56 MG Dose, (SPRAVATO, 56 MG DOSE,) 28 MG/DEVICE SOPK Administer 56 mg ( 2 of the 28 mg) intranasally as directed. 2 each 0   Esketamine HCl, 84 MG Dose, (SPRAVATO, 84 MG DOSE,) 28 MG/DEVICE SOPK Administer 27m intranasally once a week.  To be administered under the supervision of a healthcare professional. 12 each 5   ferrous sulfate 325 (65 FE) MG EC tablet Take 1 tablet (325 mg total) by mouth in the morning and at bedtime. Take iron supplement 2 hours before or 4 hours after PPI or other antacids as this requires some level of gastric acidity to aid in absorption 60 tablet 5   folic acid (FOLVITE) 1 MG tablet Take 1 tablet (1 mg total) by mouth daily. 30 tablet 0   furosemide (LASIX) 40 MG tablet TAKE 1 TABLET BY MOUTH DAILY. 30 tablet 3   glucose blood (FREESTYLE LITE) test strip Use as instructed 100 each 12   hydrOXYzine (ATARAX) 25 MG tablet Take 25 mg by mouth 3 (three) times daily as needed for anxiety.     insulin degludec (TRESIBA FLEXTOUCH) 100 UNIT/ML FlexTouch Pen Inject 30 Units into the skin 2 times daily. 54 mL 3   insulin lispro (HUMALOG KWIKPEN) 100 UNIT/ML KwikPen Inject up to 16 units 3 times daily at mealtime per sliding scale. 15 mL 11   Insulin Pen Needle (UNIFINE PENTIPS) 31G X 5 MM MISC Use as directed to inject insulin 100 each 3   lamoTRIgine (LAMICTAL) 200 MG tablet Take 2 tablets by mouth at bedtime. 180 tablet 0   lidocaine (XYLOCAINE) 5 % ointment Apply 1 application topically 3 (three) times daily as needed for mouth pain. Or sores     lisinopril (ZESTRIL) 10 MG tablet Take 1 tablet (10 mg total) by mouth daily. 90 tablet 3   meloxicam (MOBIC) 15 MG tablet Take one tab by mouth  every morning with a meal for 2 weeks, then daily as needed for pain. 30 tablet 0   NUVIGIL 200 MG TABS Take 1 tablet by mouth daily as needed (sleepiness). 30 tablet 2   nystatin (MYCOSTATIN/NYSTOP) powder Apply 3 times daily as directed. (Patient taking differently: Apply 1 application topically daily.) 60 g 3   ondansetron (ZOFRAN-ODT) 4 MG disintegrating tablet Take 4 mg by mouth 2 (two) times daily as needed for nausea or vomiting.     rizatriptan (MAXALT) 10 MG tablet Take 1 tablet (10 mg total) by mouth as needed for migraine. May repeat in 2 hours if needed (Patient taking differently: Take 10 mg by mouth every 2 (two) hours as needed for migraine. May repeat in 2 hours if needed) 10 tablet 0   rosuvastatin (CRESTOR) 20 MG tablet TAKE 1 TABLET BY MOUTH DAILY. 90 tablet 3   Semaglutide (RYBELSUS) 14 MG TABS Take 1 tablet by mouth daily. 30 tablet 0   sertraline (ZOLOFT) 100 MG tablet TAKE 2 TABLETS  BY MOUTH EVERY MORNING 180 tablet 3   spironolactone (ALDACTONE) 50 MG tablet Take 1 tablet (50 mg total) by mouth 2 (two) times daily. 60 tablet 2   sucralfate (CARAFATE) 1 g tablet Take 1 g by mouth 4 (four) times daily.     SYNTHROID 300 MCG tablet TAKE 1 TABLET BY MOUTH ONCE A DAY BEFORE BREAKFAST 90 tablet 1   Vitamin D, Ergocalciferol, (DRISDOL) 1.25 MG (50000 UNIT) CAPS capsule TAKE 1 CAPSULE BY MOUTH EVERY 7 DAYS. 12 capsule 1   No current facility-administered medications for this visit.    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   Carpal tunnel syndrome 09/06/2015   Chewing difficulty    Chronic left shoulder pain 05/17/2020   Constipation    Coronary artery disease 03/21/2021   Cystic teratoma    BENIGN   Dental crowns present    Diabetes mellitus without complication ([place])    Diabetes mellitus, type 2 ([place]) 06/10/2013   Diarrhea    Diverticulosis of colon without hemorrhage    DOE (dyspnea on exertion) 03/21/2021   Dyspepsia 12/14/2020   Essential hypertension 10/02/2007   Qualifier: Diagnosis of  By: Loanne Drilling MD, Sean A    Facial rash 06/16/2019   Female hirsutism 02/09/2015   Food allergy    Walnuts   GAD (generalized anxiety disorder)    Gastritis and gastroduodenitis    Gastroesophageal reflux disease    Grade II internal hemorrhoids    Greater trochanteric bursitis of left hip 07/24/2018   Hemorrhoids, internal 07/18/2017   History of kidney stones    History of migraine headaches    History of posttraumatic stress disorder (PTSD)    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/2015   Hypothyroidism    HYPOTHYROIDISM, POSTSURGICAL 10/02/2007   Qualifier: Diagnosis of  By: Loanne Drilling MD, Sean A    IBS (irritable bowel syndrome)    Infertility associated with anovulation 02/09/2015   Insulin resistance 02/09/2015   Joint pain    Leg swelling 03/12/8109   Lichen planus    Liver cirrhosis secondary to NASH (nonalcoholic steatohepatitis) ([place])    Lower back pain 07/18/2017   MDD (major depressive disorder) 06/06/2015   Migraine 10/02/2007   Morbid obesity with BMI of 40.0-44.9, adult ([place]) 06/23/2020   NASH (nonalcoholic steatohepatitis)    Nausea without vomiting    Nonalcoholic fatty liver disease 02/09/2015   Obstructive sleep apnea 06/27/2014   Oral mucosal lesion 06/17/2019   Osteoarthritis    Other fatigue    PCOS (polycystic ovarian syndrome)    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[place]) 01/01/2012   POLYCYSTIC OVARIAN DISEASE 10/02/2007   Qualifier:  Diagnosis of  By: Loanne Drilling MD, Hilliard Clark A    PONV (postoperative nausea and vomiting)    also hx. 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   Please see review of systems for further details on the patient's review from today.   Objective:   Physical Exam:  LMP 08/24/2020   Physical Exam  Lab Review:     Component Value Date/Time   NA 137 08/02/2021 0000   NA 140 10/11/2020 1041   K 4.4 08/02/2021 0000   CL 104 08/02/2021 0000   CO2 24 08/02/2021 0000   GLUCOSE 153 (H) 08/02/2021 0000   BUN 9 08/02/2021 0000   BUN 14 10/11/2020 1041   CREATININE 0.95 08/02/2021 0000   CALCIUM 9.4 08/02/2021 0000   PROT 7.2 08/02/2021 0000   PROT 7.1 10/11/2020 1041   ALBUMIN 3.8 02/11/2021 1516   ALBUMIN 4.2 10/11/2020 1041   AST 26 08/02/2021 0000   ALT 26 08/02/2021 0000   ALKPHOS 172 (H) 02/11/2021 1516   BILITOT 0.3 08/02/2021 0000   BILITOT <0.2 10/11/2020 1041   GFRNONAA >60 02/11/2021 1516   GFRNONAA 99 12/26/2020 0000   GFRAA 115 12/26/2020 0000       Component Value Date/Time   WBC 4.4 02/14/2021 0000   RBC 4.67 02/14/2021 0000   HGB 11.9 02/14/2021 0000   HGB 10.9 (L) 10/11/2020 1041   HCT 38.4 02/14/2021 0000   HCT 35.6 10/11/2020 1041   PLT 171 02/14/2021 0000   PLT 224 10/11/2020  1041   MCV 82.2 02/14/2021 0000   MCV 78 (L) 10/11/2020 1041   MCH 25.5 (L) 02/14/2021 0000   MCHC 31.0 (L) 02/14/2021 0000   RDW 20.0 (H) 02/14/2021 0000   RDW 17.5 (H) 10/11/2020 1041   LYMPHSABS 1,448 02/14/2021 0000   LYMPHSABS 1.7 10/11/2020 1041   MONOABS 0.3 02/11/2021 1516   EOSABS 141 02/14/2021 0000   EOSABS 0.2 10/11/2020 1041   BASOSABS 22 02/14/2021 0000   BASOSABS 0.0 10/11/2020 1041    No results found for: POCLITH, LITHIUM   No results found for: PHENYTOIN, PHENOBARB, VALPROATE, CBMZ   .res Assessment: Plan:    Plan:  Continue Spravato treatment   Continue therapy - Cathy Showfety.   Will reach out to therapist regarding DBT or referral.  Medications: Lamictal 271m daily Zoloft 1076m- 2 daily Nuvigil 20068maily  Clonazepam 1mg49mnax XR 0.5mg 3mdroxyzine 25mg 8m- using 1 tablet at bedtime.  Diagnoses and all orders for this visit:  Diagnoses and all orders for this visit:  Treatment-resistant depression     Please see After Visit Summary for patient specific instructions.  Future Appointments  Date Time Provider [place]artBrambleton/2023  9:00 AM CP-NURSE CP-CP None  08/17/2021  9:00 AM Aria Jarrard, ReginaBerdie OgrenP-CP None  08/23/2021 11:20 AM Ruben Pyka, ReginaBerdie OgrenP-CP None  11/15/2021  4:20 PM Revankar, Rajan Reita ClicheVD-HIGHPT None  11/29/2021 11:10 AM MattheLuetta NuttingCK-PCK None    No orders  of the defined types were placed in this encounter.   -------------------------------

## 2021-08-15 NOTE — Telephone Encounter (Signed)
Medication: dexlansoprazole (DEXILANT) 60 MG capsule Prior authorization submitted via CoverMyMeds on 08/15/2021 PA submission pending

## 2021-08-15 NOTE — Progress Notes (Signed)
Nurse visit:   Patient arrived for her 7th Spravato treatment, she was directed to the [facility] Treatment Room. Pt is being treated for Treatment Resistant Depression, pt will be receiving the 84 mg, maintenance dose. (3 of the 28 mg nasal sprays).     Pt's Spravato arrives from [employer] via [employer] and delivered to office, all Spravato medication is stored at doctors office per REMS/FDA guidelines. Required to be locked behind two doors. Medication is classified as CIII. Pt does not take medication home, it's administered at an approved [place]. Medication is always observed by nurse. Pt appears down and depressed, feels like Spravato is not helping her. Informed her she has had several times of interrupted treatment so that's a factor. She agreed. Beginning vitals are at 9:10 AM, 115/63, 82. Advised pt to blow her nose and recline back 45 degrees in her chair while the nasal sprays are being administered, pt given 1st inhaler then received the next 2 with 5 minutes apart. Then the lights are turned off per pt request.  40 minute vitals are at 9:50 AM, 93/50, 83. Her discharge vitals are at 11:10 AM, 110/57, 82. Rollene Fare comes to visit pt throughout her treatments then once she is clearer at the end they discuss pt's moods and progress.  Pt observed a total of 120 minutes per FDA/REMS protocol, pt was with nurse a total of 55 minutes of clinical assessment. Pt is scheduled next Wednesday, January 4th.      LOT# [id number] EXP FEB 2025

## 2021-08-16 ENCOUNTER — Other Ambulatory Visit (INDEPENDENT_AMBULATORY_CARE_PROVIDER_SITE_OTHER): Payer: Self-pay | Admitting: Family Medicine

## 2021-08-16 ENCOUNTER — Other Ambulatory Visit (HOSPITAL_COMMUNITY): Payer: Self-pay

## 2021-08-16 DIAGNOSIS — E538 Deficiency of other specified B group vitamins: Secondary | ICD-10-CM

## 2021-08-16 DIAGNOSIS — F3181 Bipolar II disorder: Secondary | ICD-10-CM | POA: Diagnosis not present

## 2021-08-16 MED ORDER — FOLIC ACID 1 MG PO TABS
1.0000 mg | ORAL_TABLET | Freq: Every day | ORAL | 0 refills | Status: DC
  Filled 2021-08-16: qty 90, 90d supply, fill #0

## 2021-08-17 ENCOUNTER — Ambulatory Visit (INDEPENDENT_AMBULATORY_CARE_PROVIDER_SITE_OTHER): Payer: 59 | Admitting: Adult Health

## 2021-08-17 ENCOUNTER — Encounter: Payer: Self-pay | Admitting: Adult Health

## 2021-08-17 ENCOUNTER — Ambulatory Visit: Payer: 59

## 2021-08-17 ENCOUNTER — Other Ambulatory Visit: Payer: Self-pay

## 2021-08-17 VITALS — BP 108/62 | HR 78

## 2021-08-17 DIAGNOSIS — F329 Major depressive disorder, single episode, unspecified: Secondary | ICD-10-CM

## 2021-08-17 NOTE — Progress Notes (Signed)
Tracy [name] [id number] 31-Mar-1966 56 y.o.  Subjective:   Patient ID:  Tracy [name] is a 56 y.o. (DOB 1965-08-13) female.  Chief Complaint: No chief complaint on file.   HPI Tracy [name] presents to the office today for follow-up of Spravato treatment - 11.  Patient was administered Spravato 84 mg intranasally today. She was observed by provider throughout [facility] treatment. The patient experienced the typical dissociation which gradually resolved over the 2-hour period of observation.There were no complications. Specifically, the patient did not have any untoward side effects - feeling disconnected from herself, her thoughts, feelings and things around her, dizziness, nausea, feeling sleepy, decreased feeling of sensitivity (numbness)spinning sensation, feeling anxious, lack of energy, increased blood pressure, nausea, vomiting, feeling drunk, or feeling happy or very excited, nausea or vomiting or headache. Blood pressures remained within normal ranges at the 40-minute and 2-hour follow-up intervals. By the time the 2 - hour observation period was met the patient was alert and oriented and able to exit without assistance.   Patient reporting no change in mood today with Spravato treatment 11. She was not tearful before or after treatment.  Discussed agenda for upcoming in office appointment. Will contact patient therapist about a joint session with patient to discuss treatment plan moving forward  - medication and therapy. Patient agreed to treatment plan.  See nursing note for further details. Will return on 08/22/2021 for Spravato treatment - 11.   [place] Office Visit from 03/14/2021 in [place] Office Visit from 02/14/2021 in [place] Office Visit from 03/01/2020 in [place]  Total GAD-7 Score 17 12 11       PHQ2-9    [place] Office  Visit from 03/14/2021 in [place] Office Visit from 02/14/2021 in [place] Visit from 10/11/2020 in [place] Office Visit from 03/01/2020 in [place] Visit from 06/16/2019 in [place] at [facility]  PHQ-2 Total Score 6 6 6 3 2   PHQ-9 Total Score 19 21 19 9 9       [place] ED from 02/11/2021 in [place] [facility] from 02/10/2021 in [place] [facility] Admission (Discharged) from 01/10/2021 in [place] No Risk No Risk No Risk        Review of Systems:  Review of Systems  Musculoskeletal:  Negative for gait problem.  Neurological:  Negative for tremors.  Psychiatric/Behavioral:         Please refer to HPI   Medications: I have reviewed the patient's current medications.  Current Outpatient Medications  Medication Sig Dispense Refill   Armodafinil 200 MG TABS Take 1 tablet by mouth as needed for sleepiness 30 tablet 2   benzonatate (TESSALON) 100 MG capsule Take by mouth 3 (three) times daily as needed for cough.     clonazePAM (KLONOPIN) 1 MG tablet Take 1 tablet by mouth 3 times daily. 90 tablet 2   Continuous Blood Gluc Sensor (DEXCOM G6 SENSOR) MISC Replace every 10 days.  Use to check blood sugar. 9 each 1   Continuous Blood Gluc Transmit (DEXCOM G6 TRANSMITTER) MISC Use to check continuous blood sugar.  Replace every 90 days 1 each 3   cyclobenzaprine (FLEXERIL) 10 MG tablet TAKE  1 TABLET BY MOUTH 2 TIMES DAILY AS NEEDED FOR MUSCLE SPASMS 20 tablet 0   dexlansoprazole (DEXILANT) 60 MG capsule Take 1 capsule (60 mg total) by mouth daily. 30 capsule 3   Esketamine HCl, 56 MG Dose, (SPRAVATO, 56 MG DOSE,) 28 MG/DEVICE SOPK Administer 56 mg ( 2 of the 28 mg) intranasally as directed. 2 each 0   Esketamine HCl, 84 MG Dose,  (SPRAVATO, 84 MG DOSE,) 28 MG/DEVICE SOPK Administer 33m intranasally once a week.  To be administered under the supervision of a healthcare professional. 6 each 5   ferrous sulfate 325 (65 FE) MG EC tablet Take 1 tablet (325 mg total) by mouth in the morning and at bedtime. Take iron supplement 2 hours before or 4 hours after PPI or other antacids as this requires some level of gastric acidity to aid in absorption 60 tablet 5   folic acid (FOLVITE) 1 MG tablet Take 1 tablet (1 mg total) by mouth daily. 90 tablet 0   furosemide (LASIX) 40 MG tablet TAKE 1 TABLET BY MOUTH DAILY. 30 tablet 3   glucose blood (FREESTYLE LITE) test strip Use as instructed 100 each 12   hydrOXYzine (ATARAX) 25 MG tablet Take 25 mg by mouth 3 (three) times daily as needed for anxiety.     insulin degludec (TRESIBA FLEXTOUCH) 100 UNIT/ML FlexTouch Pen Inject 30 Units into the skin 2 times daily. 54 mL 3   insulin lispro (HUMALOG KWIKPEN) 100 UNIT/ML KwikPen Inject up to 16 units 3 times daily at mealtime per sliding scale. 15 mL 11   Insulin Pen Needle (UNIFINE PENTIPS) 31G X 5 MM MISC Use as directed to inject insulin 100 each 3   lamoTRIgine (LAMICTAL) 200 MG tablet Take 2 tablets by mouth at bedtime. 180 tablet 0   lidocaine (XYLOCAINE) 5 % ointment Apply 1 application topically 3 (three) times daily as needed for mouth pain. Or sores     lisinopril (ZESTRIL) 10 MG tablet Take 1 tablet (10 mg total) by mouth daily. 90 tablet 3   meloxicam (MOBIC) 15 MG tablet Take one tab by mouth every morning with a meal for 2 weeks, then daily as needed for pain. 30 tablet 0   NUVIGIL 200 MG TABS Take 1 tablet by mouth daily as needed (sleepiness). 30 tablet 2   nystatin (MYCOSTATIN/NYSTOP) powder Apply 3 times daily as directed. (Patient taking differently: Apply 1 application topically daily.) 60 g 3   ondansetron (ZOFRAN-ODT) 4 MG disintegrating tablet Take 4 mg by mouth 2 (two) times daily as needed for nausea or vomiting.      rizatriptan (MAXALT) 10 MG tablet Take 1 tablet (10 mg total) by mouth as needed for migraine. May repeat in 2 hours if needed (Patient taking differently: Take 10 mg by mouth every 2 (two) hours as needed for migraine. May repeat in 2 hours if needed) 10 tablet 0   rosuvastatin (CRESTOR) 20 MG tablet TAKE 1 TABLET BY MOUTH DAILY. 90 tablet 3   Semaglutide (RYBELSUS) 14 MG TABS Take 1 tablet by mouth daily. 30 tablet 0   sertraline (ZOLOFT) 100 MG tablet TAKE 2 TABLETS BY MOUTH EVERY MORNING 180 tablet 3   spironolactone (ALDACTONE) 50 MG tablet Take 1 tablet (50 mg total) by mouth 2 (two) times daily. 60 tablet 2   sucralfate (CARAFATE) 1 g tablet Take 1 g by mouth 4 (four) times daily.     SYNTHROID 300 MCG tablet TAKE 1 TABLET BY MOUTH ONCE A  DAY BEFORE BREAKFAST 90 tablet 1   Vitamin D, Ergocalciferol, (DRISDOL) 1.25 MG (50000 UNIT) CAPS capsule TAKE 1 CAPSULE BY MOUTH EVERY 7 DAYS. 12 capsule 1   No current facility-administered medications for this visit.    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   Carpal tunnel syndrome 09/06/2015   Chewing difficulty    Chronic left shoulder pain 05/17/2020   Constipation    Coronary artery disease 03/21/2021   Cystic teratoma    BENIGN   Dental crowns present    Diabetes mellitus without complication ([place])    Diabetes mellitus, type 2 ([place]) 06/10/2013   Diarrhea    Diverticulosis of colon without hemorrhage     DOE (dyspnea on exertion) 03/21/2021   Dyspepsia 12/14/2020   Essential hypertension 10/02/2007   Qualifier: Diagnosis of  By: Loanne Drilling MD, Sean A    Facial rash 06/16/2019   Female hirsutism 02/09/2015   Food allergy    Walnuts   GAD (generalized anxiety disorder)    Gastritis and gastroduodenitis    Gastroesophageal reflux disease    Grade II internal hemorrhoids    Greater trochanteric bursitis of left hip 07/24/2018   Hemorrhoids, internal 07/18/2017   History of kidney stones    History of migraine headaches    History of posttraumatic stress disorder (PTSD)    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/2015   Hypothyroidism    HYPOTHYROIDISM, POSTSURGICAL 10/02/2007   Qualifier: Diagnosis of  By: Loanne Drilling MD, Sean A    IBS (irritable bowel syndrome)    Infertility associated with anovulation 02/09/2015   Insulin resistance 02/09/2015   Joint pain    Leg swelling 4/56/2563   Lichen planus    Liver cirrhosis secondary to NASH (nonalcoholic steatohepatitis) ([place])    Lower back pain 07/18/2017   MDD (major depressive disorder) 06/06/2015   Migraine 10/02/2007   Morbid obesity with BMI of 40.0-44.9, adult ([place]) 06/23/2020   NASH (nonalcoholic steatohepatitis)    Nausea without vomiting    Nonalcoholic fatty liver disease 02/09/2015   Obstructive sleep apnea 06/27/2014   Oral mucosal lesion 06/17/2019   Osteoarthritis    Other fatigue    PCOS (polycystic ovarian syndrome)    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[place]) 01/01/2012   POLYCYSTIC OVARIAN DISEASE 10/02/2007   Qualifier: Diagnosis of  By: Loanne Drilling MD, Hilliard Clark A    PONV (postoperative nausea and vomiting)    also hx. 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   Please see review of systems for further details on the patient's review from today.   Objective:   Physical Exam:  LMP 08/24/2020   Physical Exam Constitutional:      General: She is not in acute distress. Musculoskeletal:        General: No deformity.  Neurological:     Mental Status: She is alert and oriented to person, place, and time.     Coordination: Coordination normal.  Psychiatric:        Attention and Perception: Attention and perception normal. She does not perceive auditory or visual hallucinations.        Mood and Affect: Mood normal. Mood is not anxious or depressed. Affect is not labile, blunt, angry or inappropriate.        Speech: Speech normal.        Behavior: Behavior normal.        Thought Content: Thought content normal. Thought content is not paranoid or delusional. Thought content does not include homicidal or suicidal ideation. Thought content does not include homicidal or suicidal plan.        Cognition and Memory: Cognition and memory normal.        Judgment: Judgment normal.     Comments: Insight intact    Lab Review:     Component Value Date/Time   NA 137 08/02/2021 0000   NA 140 10/11/2020 1041   K 4.4 08/02/2021 0000   CL 104 08/02/2021 0000   CO2 24 08/02/2021 0000   GLUCOSE 153 (H) 08/02/2021 0000   BUN 9 08/02/2021 0000   BUN 14 10/11/2020 1041   CREATININE 0.95 08/02/2021 0000   CALCIUM 9.4 08/02/2021 0000   PROT 7.2 08/02/2021 0000   PROT 7.1 10/11/2020 1041   ALBUMIN 3.8 02/11/2021 1516   ALBUMIN 4.2 10/11/2020 1041   AST 26 08/02/2021 0000    ALT 26 08/02/2021 0000   ALKPHOS 172 (H) 02/11/2021 1516   BILITOT 0.3 08/02/2021 0000   BILITOT <0.2 10/11/2020 1041   GFRNONAA >60 02/11/2021 1516   GFRNONAA 99 12/26/2020 0000   GFRAA 115 12/26/2020 0000       Component Value Date/Time   WBC 4.4 02/14/2021 0000   RBC 4.67 02/14/2021 0000   HGB 11.9 02/14/2021 0000   HGB 10.9 (L) 10/11/2020 1041   HCT 38.4 02/14/2021 0000   HCT 35.6 10/11/2020 1041   PLT 171 02/14/2021 0000   PLT 224 10/11/2020 1041   MCV 82.2 02/14/2021 0000   MCV 78 (L) 10/11/2020 1041   MCH 25.5 (L) 02/14/2021 0000   MCHC 31.0 (L) 02/14/2021 0000   RDW 20.0 (H) 02/14/2021 0000   RDW 17.5 (H) 10/11/2020 1041   LYMPHSABS 1,448 02/14/2021 0000   LYMPHSABS 1.7 10/11/2020 1041   MONOABS 0.3 02/11/2021 1516   EOSABS 141 02/14/2021 0000   EOSABS 0.2 10/11/2020 1041   BASOSABS 22 02/14/2021 0000   BASOSABS 0.0 10/11/2020 1041    No results found for: POCLITH, LITHIUM   No results found for: PHENYTOIN, PHENOBARB, VALPROATE, CBMZ   .res Assessment: Plan:     Plan:  Continue Spravato treatment   Continue therapy - Cathy Showfety.   Will reach out to therapist  regarding DBT or referral.  Medications: Lamictal 225m daily Zoloft 1066m- 2 daily Nuvigil 20037maily  Clonazepam 1mg25mnax XR 0.5mg 81mdroxyzine 25mg 68m- using 1 tablet at bedtime.    Diagnoses and all orders for this visit:  Treatment-resistant depression     Please see After Visit Summary for patient specific instructions.  Future Appointments  Date Time Provider [place]art[place]/2023 11:00 AM Tymeir Weathington, ReginaBerdie OgrenP-CP None  11/15/2021  4:20 PM Revankar, Rajan Reita ClicheVD-HIGHPT None  11/29/2021 11:10 AM MattheLuetta NuttingCK-PCK None    No orders of the defined types were placed in this encounter.   -------------------------------

## 2021-08-17 NOTE — Progress Notes (Signed)
Nurse visit:   Patient arrived for her 10 th Spravato treatment, she was directed to the [facility] Treatment Room. Pt is continuing a few extra treatments due to the interruption with the holidays and being sick. Pt is being treated for Treatment Resistant Depression, pt will be receiving the 84 mg, maintenance dose. (3 of the 28 mg nasal sprays).     Pt's Spravato arrives from [employer] via [employer] and delivered to office, all Spravato medication is stored at doctors office per REMS/FDA guidelines. Required to be locked behind two doors. Medication is classified as CIII. Pt does not take medication home, it's administered at an approved [place]. Medication is always observed by nurse.Beginning vitals are at 9:10 AM, 116/63, 82. Advised pt to blow her nose and recline back 45 degrees in her chair while the nasal sprays are being administered, pt given 1st inhaler then received the next 2 with 5 minutes apart. Then the lights are turned off per pt request.  40 minute vitals are at 9:50 AM, 116/69, 8. Her discharge vitals are at 11:20 AM, 108/62, 78. Rollene Fare comes to visit pt throughout her treatments then once she is clearer at the end they discuss pt's moods and progress.  Pt observed a total of 120 minutes per FDA/REMS protocol, pt was with nurse a total of 55 minutes of clinical assessment. Pt is scheduled next Wednesday, January 18th. Then is scheduled with Rollene Fare on the 19th to discuss the plan on moving forward.      LOT# [id number] EXP FEB 2024

## 2021-08-17 NOTE — Progress Notes (Signed)
Nurse visit:   Patient arrived for her 8th Spravato treatment, she was directed to the [facility] Treatment Room. Pt is being treated for Treatment Resistant Depression, pt will be receiving the 84 mg, maintenance dose. (3 of the 28 mg nasal sprays).     Pt's Spravato arrives from [employer] via [employer] and delivered to office, all Spravato medication is stored at doctors office per REMS/FDA guidelines. Required to be locked behind two doors. Medication is classified as CIII. Pt does not take medication home, it's administered at an approved [place]. Medication is always observed by nurse. Pt appears down and depressed, feels like Spravato is not helping her. Informed her she has had several times of interrupted treatment so that's a factor. She agreed. Beginning vitals are at 9:10 AM, 122/74, 80. Advised pt to blow her nose and recline back 45 degrees in her chair while the nasal sprays are being administered, pt given 1st inhaler then received the next 2 with 5 minutes apart. Then the lights are turned off per pt request.  40 minute vitals are at 9:52 AM, 126/69, 76. Her discharge vitals are at 11:10 AM, 102/51, 77. Rollene Fare comes to visit pt throughout her treatments then once she is clearer at the end they discuss pt's moods and progress.  Pt observed a total of 120 minutes per FDA/REMS protocol, pt was with nurse a total of 55 minutes of clinical assessment. Pt is scheduled next Wednesday, January 11th.      LOT# [id number] EXP FEB 2024

## 2021-08-19 NOTE — Progress Notes (Signed)
Nurse visit:   Patient arrived for her 9th Spravato treatment, she was directed to the [facility] Treatment Room. Pt is being treated for Treatment Resistant Depression, pt will be receiving the 84 mg, maintenance dose. (3 of the 28 mg nasal sprays). pt is scheduled with Rollene Fare on the 19th and at that time they will discuss her treatment and where to go from here.    Pt's Spravato arrives from [employer] via [employer] and delivered to office, all Spravato medication is stored at doctors office per REMS/FDA guidelines. Required to be locked behind two doors. Medication is classified as CIII. Pt does not take medication home, it's administered at an approved [place]. Medication is always observed by nurse. Pt appears down and depressed, feels like Spravato is not helping her. Informed her she has had several times of interrupted treatment so that's a factor. She agreed. Beginning vitals are at 9:20 AM, 116/67, 79. Advised pt to blow her nose and recline back 45 degrees in her chair while the nasal sprays are being administered, pt given 1st inhaler then received the next 2 with 5 minutes apart. Then the lights are turned off per pt request.  40 minute vitals are at 10:00 AM, 126/72, 79. Her discharge vitals are at 11:20 AM, 101/52, 78. Rollene Fare comes to visit pt throughout her treatments then once she is clearer at the end they discuss pt's moods and progress.  Pt observed a total of 120 minutes per FDA/REMS protocol, pt was with nurse a total of 55 minutes of clinical assessment. Pt is scheduled this Friday, January 13th.      LOT# [id number] EXP 2024 SEP

## 2021-08-20 NOTE — Telephone Encounter (Signed)
Medication: dexlansoprazole (DEXILANT) 60 MG capsule Prior authorization determination received Medication has been denied Reason for denial:  "Covered alternatives may include omeprazole capsules, esomeprazole capsules, pantoprazole tablets, rabeprazole tablets, lansoprazole capsules, and lansoprazole orally dissolving tablets (ODT, dissolve in mouth), which also requires a prior authorization. Your doctor told us that you have already tried omeprazole capsules, lansoprazole capsules, and pantoprazole tablets, and our pharmacy claims show [place] that you have already tried rabeprazole tablets, but we are unable to approve your request for dexlansoprazole capsules because they are excluded from coverage by your plan benefit. "

## 2021-08-22 ENCOUNTER — Ambulatory Visit: Payer: 59 | Admitting: Adult Health

## 2021-08-22 ENCOUNTER — Encounter: Payer: Self-pay | Admitting: Adult Health

## 2021-08-22 ENCOUNTER — Other Ambulatory Visit: Payer: Self-pay

## 2021-08-22 ENCOUNTER — Ambulatory Visit: Payer: 59

## 2021-08-22 VITALS — BP 90/41 | HR 75

## 2021-08-22 DIAGNOSIS — F329 Major depressive disorder, single episode, unspecified: Secondary | ICD-10-CM

## 2021-08-22 NOTE — Progress Notes (Signed)
Tracy [name] [id number] [birth year] 56 y.o.  Subjective:   Patient ID:  Tracy [name] is a 56 y.o. (DOB 1965-08-23) female.  Chief Complaint: No chief complaint on file.   HPI RENIAH [name] presents to the office today for follow-up of Spravato treatment - 12.  Patient was administered Spravato 84 mg intranasally today. She was observed by provider throughout [facility] treatment. The patient experienced the typical dissociation which gradually resolved over the 2-hour period of observation.There were no complications. Specifically, the patient did not have any untoward side effects - feeling disconnected from herself, her thoughts, feelings and things around her, dizziness, nausea, feeling sleepy, decreased feeling of sensitivity (numbness)spinning sensation, feeling anxious, lack of energy, increased blood pressure, nausea, vomiting, feeling drunk, or feeling happy or very excited, nausea or vomiting or headache. Blood pressures remained within normal ranges at the 40-minute and 2-hour follow-up intervals. By the time the 2 - hour observation period was met the patient was alert and oriented and able to exit without assistance.   Patient reporting no change in mood today with Spravato treatment 12. She presented with a stable mood before and after procedure. Discussed termination of Spravato treatment at this time. Will be meeting with patient individually to discuss medication management on 08/23/2021. Also arranging a meeting with patient and therapist to discuss a treatment plan  Patient agreed to treatment plan.      [place] Office Visit from 03/14/2021 in [place] Office Visit from 02/14/2021 in [place] Office Visit from 03/01/2020 in [place]  Total GAD-7 Score _0 PHQ2-9    [place] Office Visit from 03/14/2021 in [place] Office Visit from 02/14/2021 in [place] Visit from 10/11/2020 in [place] Office Visit from 03/01/2020 in [place] Visit from 06/16/2019 in [place] at [facility] Total Score _1 PHQ-9 Total Score _2 Flowsheet Row ED from 02/11/2021 in [place] ED from 02/10/2021 in [place] [facility] Admission (Discharged) from 01/10/2021 in [place] No Risk No Risk No Risk        Review of Systems:  Review of Systems  Musculoskeletal:  Negative for gait problem.  Neurological:  Negative for tremors.  Psychiatric/Behavioral:         Please refer to HPI   Medications: I have reviewed the patient's current medications.  Current Outpatient Medications  Medication Sig Dispense Refill   Armodafinil 200 MG TABS Take 1 tablet by mouth as needed for sleepiness 30 tablet 2   benzonatate (TESSALON) 100 MG capsule Take by mouth 3 (three) times daily as needed for cough.     clonazePAM (KLONOPIN) 1 MG tablet Take 1 tablet by mouth 3 times daily. 90 tablet 2   Continuous Blood Gluc Sensor (DEXCOM G6 SENSOR) MISC Replace every 10 days.  Use to check blood sugar. 9 each 1   Continuous Blood Gluc Transmit (DEXCOM G6 TRANSMITTER) MISC Use to check continuous blood sugar.  Replace every 90 days 1 each 3   cyclobenzaprine (FLEXERIL) 10 MG tablet TAKE 1 TABLET BY MOUTH 2 TIMES  DAILY AS NEEDED FOR MUSCLE SPASMS 20 tablet 0   dexlansoprazole (DEXILANT) 60 MG capsule Take 1 capsule (60 mg total) by mouth daily. 30 capsule 3   Esketamine HCl, 56 MG Dose, (SPRAVATO, 56 MG DOSE,) 28 MG/DEVICE SOPK Administer 56 mg ( 2 of the 28 mg) intranasally as directed. 2 each 0   Esketamine HCl, 84 MG Dose, (SPRAVATO, 84 MG DOSE,) 28 MG/DEVICE SOPK  Administer 52m intranasally once a week.  To be administered under the supervision of a healthcare professional. 6 each 5   ferrous sulfate 325 (65 FE) MG EC tablet Take 1 tablet (325 mg total) by mouth in the morning and at bedtime. Take iron supplement 2 hours before or 4 hours after PPI or other antacids as this requires some level of gastric acidity to aid in absorption 60 tablet 5   folic acid (FOLVITE) 1 MG tablet Take 1 tablet (1 mg total) by mouth daily. 90 tablet 0   furosemide (LASIX) 40 MG tablet TAKE 1 TABLET BY MOUTH DAILY. 30 tablet 3   glucose blood (FREESTYLE LITE) test strip Use as instructed 100 each 12   hydrOXYzine (ATARAX) 25 MG tablet Take 25 mg by mouth 3 (three) times daily as needed for anxiety.     insulin degludec (TRESIBA FLEXTOUCH) 100 UNIT/ML FlexTouch Pen Inject 30 Units into the skin 2 times daily. 54 mL 3   insulin lispro (HUMALOG KWIKPEN) 100 UNIT/ML KwikPen Inject up to 16 units 3 times daily at mealtime per sliding scale. 15 mL 11   Insulin Pen Needle (UNIFINE PENTIPS) 31G X 5 MM MISC Use as directed to inject insulin 100 each 3   lamoTRIgine (LAMICTAL) 200 MG tablet Take 2 tablets by mouth at bedtime. 180 tablet 0   lidocaine (XYLOCAINE) 5 % ointment Apply 1 application topically 3 (three) times daily as needed for mouth pain. Or sores     lisinopril (ZESTRIL) 10 MG tablet Take 1 tablet (10 mg total) by mouth daily. 90 tablet 3   meloxicam (MOBIC) 15 MG tablet Take one tab by mouth every morning with a meal for 2 weeks, then daily as needed for pain. 30 tablet 0   NUVIGIL 200 MG TABS Take 1 tablet by mouth daily as needed (sleepiness). 30 tablet 2   nystatin (MYCOSTATIN/NYSTOP) powder Apply 3 times daily as directed. (Patient taking differently: Apply 1 application topically daily.) 60 g 3   ondansetron (ZOFRAN-ODT) 4 MG disintegrating tablet Take 4 mg by mouth 2 (two) times daily as needed for nausea or vomiting.     rizatriptan (MAXALT) 10 MG tablet Take 1  tablet (10 mg total) by mouth as needed for migraine. May repeat in 2 hours if needed (Patient taking differently: Take 10 mg by mouth every 2 (two) hours as needed for migraine. May repeat in 2 hours if needed) 10 tablet 0   rosuvastatin (CRESTOR) 20 MG tablet TAKE 1 TABLET BY MOUTH DAILY. 90 tablet 3   Semaglutide (RYBELSUS) 14 MG TABS Take 1 tablet by mouth daily. 30 tablet 0   sertraline (ZOLOFT) 100 MG tablet TAKE 2 TABLETS BY MOUTH EVERY MORNING 180 tablet 3   spironolactone (ALDACTONE) 50 MG tablet Take 1 tablet (50 mg total) by mouth 2 (two) times daily. 60 tablet 2   sucralfate (CARAFATE) 1 g tablet Take 1 g by mouth 4 (four) times daily.     SYNTHROID 300 MCG tablet TAKE 1 TABLET BY MOUTH ONCE A DAY BEFORE BREAKFAST 90 tablet 1  Vitamin D, Ergocalciferol, (DRISDOL) 1.25 MG (50000 UNIT) CAPS capsule TAKE 1 CAPSULE BY MOUTH EVERY 7 DAYS. 12 capsule 1   No current facility-administered medications for this visit.    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   Carpal tunnel syndrome 09/06/2015   Chewing difficulty    Chronic left shoulder pain 05/17/2020   Constipation    Coronary artery disease 03/21/2021   Cystic teratoma    BENIGN   Dental crowns present    Diabetes mellitus without complication ([place])    Diabetes mellitus, type 2 ([place]) 06/10/2013   Diarrhea    Diverticulosis of colon without hemorrhage    DOE (dyspnea on exertion) 03/21/2021    Dyspepsia 12/14/2020   Essential hypertension 10/02/2007   Qualifier: Diagnosis of  By: Loanne Drilling MD, Sean A    Facial rash 06/16/2019   Female hirsutism 02/09/2015   Food allergy    Walnuts   GAD (generalized anxiety disorder)    Gastritis and gastroduodenitis    Gastroesophageal reflux disease    Grade II internal hemorrhoids    Greater trochanteric bursitis of left hip 07/24/2018   Hemorrhoids, internal 07/18/2017   History of kidney stones    History of migraine headaches    History of posttraumatic stress disorder (PTSD)    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/2015   Hypothyroidism    HYPOTHYROIDISM, POSTSURGICAL 10/02/2007   Qualifier: Diagnosis of  By: Loanne Drilling MD, Sean A    IBS (irritable bowel syndrome)    Infertility associated with anovulation 02/09/2015   Insulin resistance 02/09/2015   Joint pain    Leg swelling 8/88/2800   Lichen planus    Liver cirrhosis secondary to NASH (nonalcoholic steatohepatitis) ([place])    Lower back pain 07/18/2017   MDD (major depressive disorder) 06/06/2015   Migraine 10/02/2007   Morbid obesity with BMI of 40.0-44.9, adult ([place]) 06/23/2020   NASH (nonalcoholic steatohepatitis)    Nausea without vomiting    Nonalcoholic fatty liver disease 02/09/2015   Obstructive sleep apnea 06/27/2014   Oral mucosal lesion 06/17/2019   Osteoarthritis    Other fatigue    PCOS (polycystic ovarian syndrome)    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[place]) 01/01/2012   POLYCYSTIC OVARIAN DISEASE 10/02/2007   Qualifier: Diagnosis of  By: Loanne Drilling MD, Hilliard Clark A    PONV (postoperative nausea and vomiting)    also hx. 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   Please see review of systems for further details on the patient's review from today.   Objective:   Physical Exam:  LMP 08/24/2020   Physical Exam Constitutional:      General: She is not in acute distress. Musculoskeletal:        General: No deformity.  Neurological:     Mental Status: She is alert and oriented to person, place, and time.     Coordination: Coordination normal.  Psychiatric:        Attention and Perception: Attention and perception normal. She does not perceive auditory or visual hallucinations.        Mood and Affect: Mood normal. Mood is not anxious or depressed. Affect is not labile, blunt, angry or inappropriate.        Speech: Speech normal.        Behavior: Behavior normal.        Thought Content: Thought content normal. Thought content is not paranoid or delusional. Thought content does not include homicidal or suicidal ideation. Thought content does not include homicidal or suicidal plan.        Cognition and Memory: Cognition and memory normal.        Judgment: Judgment normal.     Comments: Insight intact    Lab Review:     Component Value Date/Time   NA 137 08/02/2021 0000   NA 140 10/11/2020 1041   K 4.4 08/02/2021 0000   CL 104 08/02/2021 0000   CO2 24 08/02/2021 0000   GLUCOSE 153 (H) 08/02/2021 0000   BUN 9 08/02/2021 0000   BUN 14 10/11/2020 1041   CREATININE 0.95 08/02/2021 0000   CALCIUM 9.4 08/02/2021 0000   PROT 7.2 08/02/2021 0000   PROT 7.1 10/11/2020 1041   ALBUMIN 3.8 02/11/2021 1516   ALBUMIN 4.2 10/11/2020 1041   AST 26 08/02/2021 0000   ALT 26 08/02/2021 0000   ALKPHOS 172 (H)  02/11/2021 1516   BILITOT 0.3 08/02/2021 0000   BILITOT <0.2 10/11/2020 1041   GFRNONAA >60 02/11/2021 1516   GFRNONAA 99 12/26/2020 0000   GFRAA 115 12/26/2020 0000       Component Value Date/Time   WBC 4.4 02/14/2021 0000   RBC 4.67 02/14/2021 0000   HGB 11.9 02/14/2021 0000   HGB 10.9 (L) 10/11/2020 1041   HCT 38.4 02/14/2021 0000   HCT 35.6 10/11/2020 1041   PLT 171 02/14/2021 0000   PLT 224 10/11/2020 1041   MCV 82.2 02/14/2021 0000   MCV 78 (L) 10/11/2020 1041   MCH 25.5 (L) 02/14/2021 0000   MCHC 31.0 (L) 02/14/2021 0000   RDW 20.0 (H) 02/14/2021 0000   RDW 17.5 (H) 10/11/2020 1041   LYMPHSABS 1,448 02/14/2021 0000   LYMPHSABS 1.7 10/11/2020 1041   MONOABS 0.3 02/11/2021 1516   EOSABS 141 02/14/2021 0000   EOSABS 0.2 10/11/2020 1041   BASOSABS 22 02/14/2021 0000   BASOSABS 0.0 10/11/2020 1041    No results found for: POCLITH, LITHIUM   No results found for: PHENYTOIN, PHENOBARB, VALPROATE, CBMZ   .res Assessment: Plan:    Plan:  Terminate Spravato treatment   Continue therapy - Cathy Showfety.   Will reach out to therapist regarding DBT or referral.  Medications: Lamictal  215m daily Zoloft 1019m- 2 daily Nuvigil 20039maily  Clonazepam 1mg60mnax XR 0.5mg 42mdroxyzine 25mg 80m- using 1 tablet at bedtime.   Diagnoses and all orders for this visit:  Treatment-resistant depression     Please see After Visit Summary for patient specific instructions.  Future Appointments  Date Time Provider DepartYorkana/2023 11:00 AM Brownie Nehme, ReginaBerdie OgrenP-CP None  11/15/2021  4:20 PM Revankar, Rajan Reita ClicheVD-HIGHPT None  11/29/2021 11:10 AM MattheLuetta NuttingCK-PCK None    No orders of the defined types were placed in this encounter.   -------------------------------

## 2021-08-23 ENCOUNTER — Encounter: Payer: Self-pay | Admitting: Adult Health

## 2021-08-23 ENCOUNTER — Other Ambulatory Visit (HOSPITAL_COMMUNITY): Payer: Self-pay

## 2021-08-23 ENCOUNTER — Encounter: Payer: Self-pay | Admitting: Family Medicine

## 2021-08-23 ENCOUNTER — Ambulatory Visit (INDEPENDENT_AMBULATORY_CARE_PROVIDER_SITE_OTHER): Payer: 59 | Admitting: Adult Health

## 2021-08-23 DIAGNOSIS — F329 Major depressive disorder, single episode, unspecified: Secondary | ICD-10-CM

## 2021-08-23 DIAGNOSIS — F4310 Post-traumatic stress disorder, unspecified: Secondary | ICD-10-CM

## 2021-08-23 DIAGNOSIS — F331 Major depressive disorder, recurrent, moderate: Secondary | ICD-10-CM | POA: Diagnosis not present

## 2021-08-23 DIAGNOSIS — F3181 Bipolar II disorder: Secondary | ICD-10-CM | POA: Diagnosis not present

## 2021-08-23 DIAGNOSIS — G4733 Obstructive sleep apnea (adult) (pediatric): Secondary | ICD-10-CM | POA: Diagnosis not present

## 2021-08-23 DIAGNOSIS — F339 Major depressive disorder, recurrent, unspecified: Secondary | ICD-10-CM

## 2021-08-23 DIAGNOSIS — F410 Panic disorder [episodic paroxysmal anxiety] without agoraphobia: Secondary | ICD-10-CM

## 2021-08-23 DIAGNOSIS — F411 Generalized anxiety disorder: Secondary | ICD-10-CM | POA: Diagnosis not present

## 2021-08-23 MED ORDER — RISPERIDONE 1 MG PO TABS
1.0000 mg | ORAL_TABLET | Freq: Every day | ORAL | 1 refills | Status: DC
  Filled 2021-08-23: qty 90, 90d supply, fill #0
  Filled 2021-10-31: qty 90, 90d supply, fill #1

## 2021-08-23 NOTE — Progress Notes (Signed)
Nurse visit:   Patient arrived for her 11th Spravato treatment, this will be her last treatment. She will be meeting with Rollene Fare and her counselor, Rea [employer] to determine her next step. She was directed to the [facility]. Pt will be receiving the 84 mg, maintenance dose. (3 of the 28 mg nasal sprays).     Pt's Spravato arrives from [employer] via [employer] [facility] and delivered to office, all Spravato medication is stored at doctors office per REMS/FDA guidelines. I did notify the pharmacy that the pt is no longer doing treatments so cancel all her orders. The medication is required to be locked behind two doors. Medication is classified as CIII. Pt does not take medication home, it's administered at an approved [place]. Medication is always observed by nurse.Beginning vitals are at 9:10 AM, 126/79, 66. Advised pt to blow her nose and recline back 45 degrees in her chair while the nasal sprays are being administered, pt given 1st inhaler then received the next 2 with 5 minutes apart. Then the lights are turned off per pt request.  40 minute vitals are at 9:50 AM, 119/67, pulse 75. Her discharge vitals are at 11:10 AM, 90/41, 75. Pt was very sedate today, but I have woken her up to talk to [place]. Then pt will be discharged.  Pt observed a total of 120 minutes per FDA/REMS protocol, pt was with nurse a total of 55 minutes of clinical assessment. Pt is scheduled tomorrow with Mannie Stabile.      LOT# [id number] EXP FEB 2024

## 2021-08-23 NOTE — Progress Notes (Signed)
DANNELL RACZKOWSKI [id number] 1965/10/06 56 y.o.  Subjective:   Patient ID:  Tracy [name] is a 56 y.o. (DOB 1965/08/25) female.  Chief Complaint: No chief complaint on file.   HPI Tracy [name] presents to the office today for follow-up of MDD, GAD, PTSD, and Panic attacks.  Describes mood today as "not good". Pleasant. Tearful at times. Mood symptoms - reports depression, irritability and anxiety. Reports decreased worry and rumination. Reports recent panic attack - Monday. Stating "I'm still struggling".  Recently completed 12 Spravato treatments with no noted overall improvement. Met with therapist yesterday and discussed a new strategy to help with mood symptoms. Also working with DBT - has a work Holiday representative. Willing to continue therapy regularly and work harder on self improvement. Also interested in continuing medication trials. Discussed PTSD as an ongoing issue presenting limitations to treatment as well - reporting fear that has lead to paranoia.Varying interest and motivation. Energy levels poor. Active, does not have a regular exercise routine.  Enjoys some usual interests and activities. Lives alone. Recently divorced. Siblings local. Spending time with family. Appetite adequate. Weight stable - 300 pounds.  Sleep has improved. Averages 6 to 8 hours. Sleep apnea - taking Nuvigil in the morning.  Denies SI or HI.  Denies AH or VH.   Previous medication trials:  Antipsychotics: Vraylar-TD, Latuda, Abilify, Geodon, Rexulti, Lybalvi  Mood Stabilizers - Depakote, Lithium, Lamictal, Equetro, Topamax, Trileptal, Gabapentin  SSRI - Zoloft, Lexapro, Celexa, Prozac, Viibryd, Trintellix, Prozac  SNRI - Pristiq, Effexor, Cymbalta  Wellbutrin - allergic reaction  Anti-anxiety - Buspar, Xanax, Clonazepam, Ativan, Valium  Sleep agents - Trazadone, Ambien, Restoril   Other - Deplin, Emsam, Serzone, Doxepin, Nuvigil, Vistaril  Previous treatment: ECT and Arjay     GAD-7     Flowsheet Row Office Visit from 03/14/2021 in [place] [facility] Office Visit from 02/14/2021 in [facility] Office Visit from 03/01/2020 in [facility]  Total GAD-7 Score 17 12 11       PHQ2-9    [facility] Office Visit from 03/14/2021 in [place] Office Visit from 02/14/2021 in [facility] Office Visit from 10/11/2020 in [facility] Office Visit from 03/01/2020 in [facility] Visit from 06/16/2019 in [place] at [facility] Total Score 6 6 6 3 2   PHQ-9 Total Score 19 21 19 9 9       Flowsheet Row ED from 02/11/2021 in [facility] from 02/10/2021 in [facility] Admission (Discharged) from 01/10/2021 in [facility] No Risk No Risk No Risk        Review of Systems:  Review of Systems  Musculoskeletal:  Negative for gait problem.  Neurological:  Negative for tremors.  Psychiatric/Behavioral:         Please refer to HPI   Medications: I have reviewed the patient's current medications.  Current Outpatient Medications  Medication Sig Dispense Refill   risperiDONE (RISPERDAL) 1 MG tablet Take 1 tablet (1 mg total) by mouth at bedtime. 90 tablet 1   Armodafinil 200 MG TABS Take 1 tablet by mouth as needed for sleepiness 30 tablet 2   benzonatate (TESSALON) 100 MG capsule Take by mouth 3 (three) times daily as needed for cough.  clonazePAM (KLONOPIN) 1 MG tablet Take 1 tablet by mouth 3 times daily. 90 tablet 2   Continuous Blood Gluc Sensor (DEXCOM G6 SENSOR) MISC Replace every 10 days.  Use to check blood sugar. 9 each 1   Continuous Blood Gluc Transmit (DEXCOM G6 TRANSMITTER) MISC Use to check continuous blood sugar.  Replace  every 90 days 1 each 3   cyclobenzaprine (FLEXERIL) 10 MG tablet TAKE 1 TABLET BY MOUTH 2 TIMES DAILY AS NEEDED FOR MUSCLE SPASMS 20 tablet 0   dexlansoprazole (DEXILANT) 60 MG capsule Take 1 capsule (60 mg total) by mouth daily. 30 capsule 3   Esketamine HCl, 56 MG Dose, (SPRAVATO, 56 MG DOSE,) 28 MG/DEVICE SOPK Administer 56 mg ( 2 of the 28 mg) intranasally as directed. 2 each 0   Esketamine HCl, 84 MG Dose, (SPRAVATO, 84 MG DOSE,) 28 MG/DEVICE SOPK Administer 75m intranasally once a week.  To be administered under the supervision of a healthcare professional. 6 each 5   ferrous sulfate 325 (65 FE) MG EC tablet Take 1 tablet (325 mg total) by mouth in the morning and at bedtime. Take iron supplement 2 hours before or 4 hours after PPI or other antacids as this requires some level of gastric acidity to aid in absorption 60 tablet 5   folic acid (FOLVITE) 1 MG tablet Take 1 tablet (1 mg total) by mouth daily. 90 tablet 0   furosemide (LASIX) 40 MG tablet TAKE 1 TABLET BY MOUTH DAILY. 30 tablet 3   glucose blood (FREESTYLE LITE) test strip Use as instructed 100 each 12   hydrOXYzine (ATARAX) 25 MG tablet Take 25 mg by mouth 3 (three) times daily as needed for anxiety.     insulin degludec (TRESIBA FLEXTOUCH) 100 UNIT/ML FlexTouch Pen Inject 30 Units into the skin 2 times daily. 54 mL 3   insulin lispro (HUMALOG KWIKPEN) 100 UNIT/ML KwikPen Inject up to 16 units 3 times daily at mealtime per sliding scale. 15 mL 11   Insulin Pen Needle (UNIFINE PENTIPS) 31G X 5 MM MISC Use as directed to inject insulin 100 each 3   lamoTRIgine (LAMICTAL) 200 MG tablet Take 2 tablets by mouth at bedtime. 180 tablet 0   lidocaine (XYLOCAINE) 5 % ointment Apply 1 application topically 3 (three) times daily as needed for mouth pain. Or sores     lisinopril (ZESTRIL) 10 MG tablet Take 1 tablet (10 mg total) by mouth daily. 90 tablet 3   meloxicam (MOBIC) 15 MG tablet Take one tab by mouth every morning with a meal for 2  weeks, then daily as needed for pain. 30 tablet 0   NUVIGIL 200 MG TABS Take 1 tablet by mouth daily as needed (sleepiness). 30 tablet 2   nystatin (MYCOSTATIN/NYSTOP) powder Apply 3 times daily as directed. (Patient taking differently: Apply 1 application topically daily.) 60 g 3   ondansetron (ZOFRAN-ODT) 4 MG disintegrating tablet Take 4 mg by mouth 2 (two) times daily as needed for nausea or vomiting.     rizatriptan (MAXALT) 10 MG tablet Take 1 tablet (10 mg total) by mouth as needed for migraine. May repeat in 2 hours if needed (Patient taking differently: Take 10 mg by mouth every 2 (two) hours as needed for migraine. May repeat in 2 hours if needed) 10 tablet 0   rosuvastatin (CRESTOR) 20 MG tablet TAKE 1 TABLET BY MOUTH DAILY. 90 tablet 3   Semaglutide (RYBELSUS) 14 MG TABS Take 1 tablet by mouth daily. 30 tablet  0   sertraline (ZOLOFT) 100 MG tablet TAKE 2 TABLETS BY MOUTH EVERY MORNING 180 tablet 3   spironolactone (ALDACTONE) 50 MG tablet Take 1 tablet (50 mg total) by mouth 2 (two) times daily. 60 tablet 2   sucralfate (CARAFATE) 1 g tablet Take 1 g by mouth 4 (four) times daily.     SYNTHROID 300 MCG tablet TAKE 1 TABLET BY MOUTH ONCE A DAY BEFORE BREAKFAST 90 tablet 1   Vitamin D, Ergocalciferol, (DRISDOL) 1.25 MG (50000 UNIT) CAPS capsule TAKE 1 CAPSULE BY MOUTH EVERY 7 DAYS. 12 capsule 1   No current facility-administered medications for this visit.    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   Carpal tunnel syndrome 09/06/2015   Chewing difficulty    Chronic left shoulder pain 05/17/2020   Constipation    Coronary artery disease 03/21/2021   Cystic teratoma    BENIGN   Dental crowns present    Diabetes mellitus without complication ([place])    Diabetes mellitus, type 2 ([place]) 06/10/2013   Diarrhea    Diverticulosis of colon without hemorrhage    DOE (dyspnea on exertion) 03/21/2021   Dyspepsia 12/14/2020   Essential hypertension 10/02/2007   Qualifier: Diagnosis of  By: Loanne Drilling MD, Sean A    Facial rash 06/16/2019   Female hirsutism 02/09/2015   Food allergy    Walnuts   GAD (generalized anxiety disorder)    Gastritis and gastroduodenitis    Gastroesophageal reflux disease    Grade II internal hemorrhoids    Greater trochanteric bursitis of left hip 07/24/2018   Hemorrhoids, internal 07/18/2017   History of kidney stones    History of migraine headaches    History of posttraumatic stress disorder (PTSD)    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/2015   Hypothyroidism    HYPOTHYROIDISM, POSTSURGICAL 10/02/2007   Qualifier: Diagnosis of  By: Loanne Drilling MD, Sean A    IBS (irritable bowel syndrome)    Infertility associated with anovulation 02/09/2015   Insulin resistance 02/09/2015   Joint pain    Leg swelling 12/06/5463   Lichen planus    Liver cirrhosis secondary to NASH (nonalcoholic steatohepatitis) ([place])    Lower back pain 07/18/2017   MDD (major depressive disorder) 06/06/2015   Migraine 10/02/2007   Morbid obesity with BMI of 40.0-44.9, adult ([name]) 06/23/2020   NASH (nonalcoholic steatohepatitis)    Nausea without vomiting    Nonalcoholic fatty liver disease 02/09/2015   Obstructive sleep apnea 06/27/2014   Oral mucosal lesion 06/17/2019   Osteoarthritis    Other fatigue    PCOS (polycystic ovarian syndrome)    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[place]) 01/01/2012   POLYCYSTIC OVARIAN DISEASE 10/02/2007   Qualifier: Diagnosis of  By: Loanne Drilling  MD, Hilliard Clark A    PONV (postoperative nausea and vomiting)    also hx. 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   Please see review of systems for further details on the patient's review from today.   Objective:   Physical Exam:  LMP 08/24/2020   Physical Exam Constitutional:      General: She is not in acute distress. Musculoskeletal:        General: No deformity.  Neurological:     Mental Status: She is alert and oriented to person, place, and time.     Coordination: Coordination normal.  Psychiatric:        Attention and Perception: Attention and perception normal. She does not perceive auditory or visual hallucinations.        Mood and Affect: Mood normal. Mood is not anxious or depressed. Affect is not labile, blunt, angry or inappropriate.        Speech: Speech normal.        Behavior: Behavior normal.        Thought Content: Thought content normal. Thought content is not paranoid or delusional. Thought content does not include homicidal or suicidal ideation. Thought content does not include homicidal or suicidal plan.        Cognition and Memory: Cognition and memory normal.        Judgment: Judgment normal.     Comments: Insight intact    Lab Review:     Component Value Date/Time    NA 137 08/02/2021 0000   NA 140 10/11/2020 1041   K 4.4 08/02/2021 0000   CL 104 08/02/2021 0000   CO2 24 08/02/2021 0000   GLUCOSE 153 (H) 08/02/2021 0000   BUN 9 08/02/2021 0000   BUN 14 10/11/2020 1041   CREATININE 0.95 08/02/2021 0000   CALCIUM 9.4 08/02/2021 0000   PROT 7.2 08/02/2021 0000   PROT 7.1 10/11/2020 1041   ALBUMIN 3.8 02/11/2021 1516   ALBUMIN 4.2 10/11/2020 1041   AST 26 08/02/2021 0000   ALT 26 08/02/2021 0000   ALKPHOS 172 (H) 02/11/2021 1516   BILITOT 0.3 08/02/2021 0000   BILITOT <0.2 10/11/2020 1041   GFRNONAA >60 02/11/2021 1516   GFRNONAA 99 12/26/2020 0000   GFRAA 115 12/26/2020 0000       Component Value Date/Time   WBC 4.4 02/14/2021 0000   RBC 4.67 02/14/2021 0000   HGB 11.9 02/14/2021 0000   HGB 10.9 (L) 10/11/2020 1041   HCT 38.4 02/14/2021 0000   HCT 35.6 10/11/2020 1041   PLT 171 02/14/2021 0000   PLT 224 10/11/2020 1041   MCV 82.2 02/14/2021 0000   MCV 78 (L) 10/11/2020 1041   MCH 25.5 (L) 02/14/2021 0000   MCHC 31.0 (L) 02/14/2021 0000   RDW 20.0 (H) 02/14/2021 0000   RDW 17.5 (H) 10/11/2020 1041   LYMPHSABS 1,448 02/14/2021 0000   LYMPHSABS 1.7 10/11/2020 1041   MONOABS 0.3  02/11/2021 1516   EOSABS 141 02/14/2021 0000   EOSABS 0.2 10/11/2020 1041   BASOSABS 22 02/14/2021 0000   BASOSABS 0.0 10/11/2020 1041    No results found for: POCLITH, LITHIUM   No results found for: PHENYTOIN, PHENOBARB, VALPROATE, CBMZ   .res Assessment: Plan:    Plan:  PDMP reviewed Add Risperdal 89m at hs - 1.2 x 7, then one tablet at hs Lamictal 2055mdaily Zoloft 10036m 2 daily Nuvigil 200m58mily - take every day Clonazepam 1mg 17max XR 0.5mg  61mroxyzine 25mg T71m using 1 tablet at bedtime.    Time spent with patient was 40 minutes. Greater than 50% of face to face time with patient was spent on counseling and coordination of care.       Cathy SRea Collegeapist  RTC 4 weeks  Has tried multiple medications without any long  term relief of symptoms. Has also participated in TMS x 2Vermilliond ECT. Felt like TMS wasCruciblelpful initially and tried it a second time without further success. Does not feel current medications are offering symptom relief. Cuurently awaiting Spravato treatment.  Patient advised to contact office with any questions, adverse effects, or acute worsening in signs and symptoms.  Counseled patient regarding potential benefits, risks, and side effects of Lamictal to include potential risk of Stevens-Johnson syndrome. Advised patient to stop taking Lamictal and contact office immediately if rash develops and to seek urgent medical attention if rash is severe and/or spreading quickly.   Discussed potential benefits, risk, and side effects of benzodiazepines to include potential risk of tolerance and dependence, as well as possible drowsiness.  Advised patient not to drive if experiencing drowsiness and to take lowest possible effective dose to minimize risk of dependence and tolerance.  Discussed potential metabolic side effects associated with atypical antipsychotics, as well as potential risk for movement side effects. Advised pt to contact office if movement side effects occur.    Diagnoses and all orders for this visit:  Recurrent major depression resistant to treatment (HCC) - [place] VITAMIN D 25 Hydroxy (Vit-D Deficiency, Fractures) -     risperiDONE (RISPERDAL) 1 MG tablet; Take 1 tablet (1 mg total) by mouth at bedtime.  Treatment-resistant depression  GAD (generalized anxiety disorder) -     risperiDONE (RISPERDAL) 1 MG tablet; Take 1 tablet (1 mg total) by mouth at bedtime.  PTSD (post-traumatic stress disorder) -     risperiDONE (RISPERDAL) 1 MG tablet; Take 1 tablet (1 mg total) by mouth at bedtime.  Major depressive disorder, recurrent episode, moderate (HCC) -     risperiDONE (RISPERDAL) 1 MG tablet; Take 1 tablet (1 mg total) by mouth at bedtime.  Panic attacks -     risperiDONE (RISPERDAL)  1 MG tablet; Take 1 tablet (1 mg total) by mouth at bedtime.  OSA (obstructive sleep apnea)     Please see After Visit Summary for patient specific instructions.  Future Appointments  Date Time Provider [id number]  4:20 PM Revankar, Rajan RReita ClicheD-[facility] None  11/29/2021 11:10 AM MatthewLuetta NuttingK[facility] None    Orders Placed This Encounter  Procedures   VITAMIN D 25 Hydroxy (Vit-D Deficiency, Fractures)    -------------------------------

## 2021-08-24 ENCOUNTER — Other Ambulatory Visit (HOSPITAL_COMMUNITY): Payer: Self-pay

## 2021-08-24 MED ORDER — PANTOPRAZOLE SODIUM 40 MG PO TBEC
80.0000 mg | DELAYED_RELEASE_TABLET | Freq: Two times a day (BID) | ORAL | 3 refills | Status: DC
  Filled 2021-08-24: qty 60, 15d supply, fill #0
  Filled 2021-11-05: qty 60, 15d supply, fill #1
  Filled 2021-11-06 – 2021-11-27 (×2): qty 60, 15d supply, fill #2
  Filled 2021-12-26: qty 60, 15d supply, fill #3

## 2021-08-27 ENCOUNTER — Other Ambulatory Visit (HOSPITAL_COMMUNITY): Payer: Self-pay

## 2021-08-27 ENCOUNTER — Other Ambulatory Visit (INDEPENDENT_AMBULATORY_CARE_PROVIDER_SITE_OTHER): Payer: Self-pay | Admitting: Family Medicine

## 2021-08-27 DIAGNOSIS — E1165 Type 2 diabetes mellitus with hyperglycemia: Secondary | ICD-10-CM

## 2021-08-27 NOTE — Telephone Encounter (Signed)
Dr.Beasley 

## 2021-08-28 ENCOUNTER — Encounter: Payer: Self-pay | Admitting: Family Medicine

## 2021-08-28 DIAGNOSIS — Z794 Long term (current) use of insulin: Secondary | ICD-10-CM

## 2021-08-29 ENCOUNTER — Other Ambulatory Visit: Payer: Self-pay | Admitting: Family Medicine

## 2021-08-29 ENCOUNTER — Other Ambulatory Visit (HOSPITAL_COMMUNITY): Payer: Self-pay

## 2021-08-29 ENCOUNTER — Other Ambulatory Visit: Payer: Self-pay | Admitting: Internal Medicine

## 2021-08-29 DIAGNOSIS — M1712 Unilateral primary osteoarthritis, left knee: Secondary | ICD-10-CM

## 2021-08-29 DIAGNOSIS — M62831 Muscle spasm of calf: Secondary | ICD-10-CM

## 2021-08-29 MED ORDER — CYCLOBENZAPRINE HCL 10 MG PO TABS
ORAL_TABLET | ORAL | 0 refills | Status: DC
  Filled 2021-08-29: qty 20, 10d supply, fill #0

## 2021-08-29 MED ORDER — MELOXICAM 15 MG PO TABS
ORAL_TABLET | ORAL | 0 refills | Status: DC
  Filled 2021-08-29: qty 30, 30d supply, fill #0

## 2021-08-29 MED ORDER — RYBELSUS 14 MG PO TABS
14.0000 mg | ORAL_TABLET | Freq: Every day | ORAL | 1 refills | Status: DC
  Filled 2021-08-29: qty 90, 90d supply, fill #0
  Filled 2021-11-05: qty 90, 90d supply, fill #1

## 2021-08-29 NOTE — Telephone Encounter (Signed)
Rx sent in

## 2021-08-30 ENCOUNTER — Other Ambulatory Visit (HOSPITAL_COMMUNITY): Payer: Self-pay

## 2021-08-30 ENCOUNTER — Telehealth (HOSPITAL_COMMUNITY): Payer: Self-pay | Admitting: Psychiatry

## 2021-08-30 DIAGNOSIS — F3181 Bipolar II disorder: Secondary | ICD-10-CM | POA: Diagnosis not present

## 2021-08-30 MED ORDER — ONDANSETRON 4 MG PO TBDP
4.0000 mg | ORAL_TABLET | Freq: Two times a day (BID) | ORAL | 1 refills | Status: DC | PRN
  Filled 2021-08-30: qty 20, 10d supply, fill #0

## 2021-08-30 MED ORDER — HYDROXYZINE HCL 25 MG PO TABS
25.0000 mg | ORAL_TABLET | Freq: Three times a day (TID) | ORAL | 1 refills | Status: DC | PRN
  Filled 2021-08-30: qty 30, 10d supply, fill #0
  Filled 2021-10-01: qty 30, 10d supply, fill #1

## 2021-08-30 NOTE — Telephone Encounter (Signed)
D:  Pt called and stated she was referred to [facility].  A:  Oriented pt.  Pt is scheduled for a CCA on 09-04-21 @ 1 pm and she will start the virtual groups on 09-05-21 @ 9 a.m.Marland Kitchen  Case mgr had offered to do the CCA on 09-03-21, pt declined d/t work. Discussed safety options if pt were to need to be seen earlier.  R:  Pt receptive.

## 2021-08-31 ENCOUNTER — Telehealth (HOSPITAL_COMMUNITY): Payer: Self-pay | Admitting: Licensed Clinical Social Worker

## 2021-08-31 ENCOUNTER — Telehealth: Payer: Self-pay | Admitting: Adult Health

## 2021-08-31 NOTE — Telephone Encounter (Signed)
Cln was contacted by pt who is scheduled for a CCA for IOP and wanted to confirm days and times of IOP for FMLA. Cln informed pt that IOP is Mon-Friday, 9am-12pm. Cln recommended pt contact [employer] to confirm how much (if any) she would have to pay out-of-pocket and cln provided CPT code in case it is needed. 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 Cln advised pt to check [place] policies related to [facility] and to follow all steps and communicate with her leadership. Cln also shared that, from personal experience, it is highly unlikely that pt would be terminated for needing FMLA for mental health reasons if she follows the appropriate steps and communicates with her leader. Pt expressed appreciation and was tearful at times. Cln asked about SI and HI and pt stated, "I've had passive suicidal ideation for years. Sometimes it just pops in my head and I can tell myself no, you don't want to hurt yourself." Cln asked pt what she would do if the thoughts became overwhelming and pt stated she would contact the suicide prevention lifeline. Cln provided the updated number and website (988, 988lifeline.org) and informed pt she could also text the number. Pt verbalized understanding and expressed appreciation. Pt discussed some of her mental health history and shared that she recently accidentally overdosed when trying to sleep and that she was referred to IOP because of that. Cln asked again about SI and pt stated, "I want to live. I have so much to live for."

## 2021-08-31 NOTE — Telephone Encounter (Signed)
ERROR

## 2021-09-03 ENCOUNTER — Telehealth: Payer: Self-pay | Admitting: Adult Health

## 2021-09-03 ENCOUNTER — Telehealth (HOSPITAL_COMMUNITY): Payer: Self-pay | Admitting: Psychiatry

## 2021-09-03 NOTE — Telephone Encounter (Signed)
Please call pt for gina

## 2021-09-03 NOTE — Telephone Encounter (Signed)
Pt LVM. Stating she is now doing a partial hospital inpatient and need to speak to [place]. Contact # [phone]

## 2021-09-03 NOTE — Telephone Encounter (Signed)
I'm ok with either - don't want her getting overwhelmed - need to focus on herself.

## 2021-09-03 NOTE — Telephone Encounter (Signed)
Rtc to pt and she is asking about what Barnett Applebaum would prefer her to do about work. She will be starting IOP this Wednesday which is daily. She says if she can work weekends that's fine or if she needs to take the whole amount of weeks off she will.  Asking for Gina's opinion. I informed her I would call her back.

## 2021-09-03 NOTE — Telephone Encounter (Signed)
D:  Pt called inquiring about MH-IOP.  "I can't remember anything, so can you please remind me about the hrs and days of IOP?"  A:  Case mgr re-oriented pt.  CCA is scheduled for tomorrow at 1pm; so pt can start on 09-05-21 @ 9 a.m.Marland Kitchen  Pt states she would be calling [employer] to inform them of start date.  R:  Patient receptive.

## 2021-09-03 NOTE — Telephone Encounter (Signed)
Noted- she is on my call list. thanks

## 2021-09-04 ENCOUNTER — Other Ambulatory Visit (HOSPITAL_COMMUNITY): Payer: 59 | Attending: Psychiatry | Admitting: Psychiatry

## 2021-09-04 ENCOUNTER — Telehealth: Payer: Self-pay | Admitting: Adult Health

## 2021-09-04 ENCOUNTER — Other Ambulatory Visit: Payer: Self-pay

## 2021-09-04 DIAGNOSIS — F3181 Bipolar II disorder: Secondary | ICD-10-CM | POA: Diagnosis not present

## 2021-09-04 NOTE — Telephone Encounter (Signed)
Pt left a message that she wanted to know if she is suppose to be on continuous leave of absence. She is doing IOP during the week but can work on the weekends. She can use short term disability if needed. Please call her and let her know at [phone]
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
## 2021-09-05 ENCOUNTER — Encounter (HOSPITAL_COMMUNITY): Payer: Self-pay | Admitting: Psychiatry

## 2021-09-05 ENCOUNTER — Other Ambulatory Visit (HOSPITAL_COMMUNITY): Payer: 59 | Attending: Psychiatry | Admitting: Psychiatry

## 2021-09-05 ENCOUNTER — Telehealth: Payer: Self-pay

## 2021-09-05 ENCOUNTER — Other Ambulatory Visit: Payer: Self-pay

## 2021-09-05 DIAGNOSIS — R45851 Suicidal ideations: Secondary | ICD-10-CM | POA: Insufficient documentation

## 2021-09-05 DIAGNOSIS — F339 Major depressive disorder, recurrent, unspecified: Secondary | ICD-10-CM | POA: Insufficient documentation

## 2021-09-05 DIAGNOSIS — G4733 Obstructive sleep apnea (adult) (pediatric): Secondary | ICD-10-CM | POA: Diagnosis not present

## 2021-09-05 NOTE — Telephone Encounter (Signed)
Rtc to pt and left voicemail with information.

## 2021-09-05 NOTE — Plan of Care (Signed)
Pt will improve her mood as evidenced by being happy again, managing her mood and coping with daily stressors for 5 out of 7 days for 60 days. °

## 2021-09-05 NOTE — Telephone Encounter (Signed)
Received fax from [employer] re: Corky Sox. They need completion of an FMLA form. Placed in [name]'s box.

## 2021-09-05 NOTE — Telephone Encounter (Signed)
Pt LVM reporting she needs to have some contact with some one. Has not received a call back. We will be getting paperwork from [facility] for STD and [employer] for LOA. Call Pt @ [phone].

## 2021-09-05 NOTE — Progress Notes (Signed)
Virtual Visit via Video Note   I connected with Tracy [name] on 09/05/21 at  9:00 AM EDT by a video enabled telemedicine application and verified that I am speaking with the correct person using two identifiers.   At orientation to the IOP program, Case Manager discussed the limitations of evaluation and management by telemedicine and the availability of in person appointments. The patient expressed understanding and agreed to proceed with virtual visits throughout the duration of the program.   Location:  Patient: Patient Home Provider: OPT [place] Office   History of Present Illness: MDD, treatment resistant    Observations/Objective: Check In: Case Manager checked in with all participants to review discharge dates, insurance authorizations, work-related documents and needs from the treatment team regarding medications. Tracy [name] stated needs and engaged in discussion.    Initial Therapeutic Activity: Counselor facilitated a check-in with Tracy [name] to assess for safety, sobriety and medication compliance.  Counselor also inquired about Tracy [name]'s current emotional ratings, as well as any significant changes in thoughts, feelings or behavior since previous check in.  Tracy [name] presented for session on time and was alert, oriented x5, with no evidence or self-report of active SI/HI or A/V H.  Tracy [name] reported compliance with medication and denied use of alcohol or illicit substances.  Tracy [name] reported scores of 5/10 for depression, 7/10 for anxiety, and 8/10 for anger/irritability.  Tracy [name] denied any recent panic attacks.  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  [name] reported that her goal is to improve persona hygiene by taking a shower this afternoon.  Tracy [name] denied SI today, but noted that she has  hx of attempts and passive SI, and is able to contract for safety should SI return with development of intent or plan.      Second Therapeutic Activity: Counselor introduced topic of stress management today.  Counselor provided definition of stress as feeling tense, overwhelmed, worn out, and/or exhausted, and noted that in small amounts, stress can be motivating until things become too overwhelming to manage.  Counselor also explained how stress can be acute (brief but intense) or chronic (long-lasting) and this can impact the severity of symptoms one can experience in the physical, emotional, and behavioral categories.  Counselor inquired about the types of anxiety/stress that members tend to experience, as well as the impact it has had upon their thoughts, feelings, and behavior.  Counselor introduced several helpful strategies for members to explore to aid in providing stress reduction, such as mindful breathing to stay grounded and clear mind, progressive muscle relaxation to alleviate tension in the body, and guided imagery to visualize a safe space one can temporarily escape to when needed.  Counselor also discussed various cognitive distortions which could lead to feelings of increased stress, such as jumping to conclusions, overgeneralizing, mental filtering, and labeling.  Counselor asked which ones members could relate to, and recommended keeping a thought journal to document these unhelpful thinking styles, and begin to challenge them to interrupt cycle.  Counselor encouraged members to consider discussing stressor 'red flags' with their close supports that can be monitored and strategies for assisting them in times of crisis.  Intervention was effective, as evidenced by Tracy [name] actively participating in discussion on subject, reporting that she has been dealing with increased stress recently, and  some of her identified stressors include family conflict, lack of confidence, maintaining good health,  financial worries, planning for retirement, and pain/fatigue.  Tracy [name] reported that she will plan to be more mindful of warning signs that could indicate growing stress such as chest pain, panic attacks, anxiety, nausea, forgetfulness, and crying spells, and use the 'stress tracker' tool that was discussed in session to monitor symptoms.  Tracy [name] reported that her stress management goal is to keep average stress level at below 6/10 for next week via increased time spent towards self-care as an outlet, including getting back into caring for plants, and possibly fishing.  Tracy [name] also successfully participated in deep breathing relaxation activity, stating I liked it even though this is usually hard for me to do without an app.  The grounding word really helped.      Assessment and Plan: Counselor recommends that Tracy [name] remain in IOP treatment to better manage mental health symptoms, ensure stability and pursue completion of treatment plan goals. Counselor recommends adherence to crisis/safety plan, taking medications as prescribed, and following up with medical professionals if any issues arise.   Follow Up Instructions: Counselor will send Webex link for next session. [name] was advised to call back or seek an in-person evaluation if the symptoms worsen or if the condition fails to improve as anticipated.   I provided 180 minutes of non-face-to-face time during this encounter.   Shade Flood, LCSW, LCAS 09/05/21

## 2021-09-05 NOTE — Telephone Encounter (Signed)
See message regarding FMLA

## 2021-09-06 ENCOUNTER — Other Ambulatory Visit: Payer: Self-pay

## 2021-09-06 ENCOUNTER — Other Ambulatory Visit (HOSPITAL_COMMUNITY): Payer: 59 | Admitting: Licensed Clinical Social Worker

## 2021-09-06 DIAGNOSIS — F339 Major depressive disorder, recurrent, unspecified: Secondary | ICD-10-CM | POA: Diagnosis not present

## 2021-09-06 DIAGNOSIS — R45851 Suicidal ideations: Secondary | ICD-10-CM | POA: Diagnosis not present

## 2021-09-06 NOTE — Progress Notes (Signed)
Virtual Visit via Video Note   I connected with [name] on 09/06/21 at  9:00 AM EDT by a video enabled telemedicine application and verified that I am speaking with the correct person using two identifiers.   At orientation to the IOP program, Case Manager discussed the limitations of evaluation and management by telemedicine and the availability of in person appointments. The patient expressed understanding and agreed to proceed with virtual visits throughout the duration of the program.   Location:  Patient: Patient Home Provider: [facility] [place] [facility]   History of Present Illness: MDD, treatment resistant    Observations/Objective: Check In: Case Manager checked in with all participants to review discharge dates, insurance authorizations, work-related documents and needs from the treatment team regarding medications. Harpreet stated needs and engaged in discussion.    Initial Therapeutic Activity: Counselor facilitated a check-in with Makinlee to assess for safety, sobriety and medication compliance.  Counselor also inquired about Cherene's current emotional ratings, as well as any significant changes in thoughts, feelings or behavior since previous check in.  Abbegail presented for session on time and was alert, oriented x5, with no evidence or self-report of active HI or A/V H.  Treyana reported compliance with medication and denied use of alcohol or illicit substances.  Blue reported scores of 8/10 for depression, 0/10 for anxiety, and 0/10 for anger/irritability.  Windie denied any recent outbursts or panic attacks.  Donnell reported that a recent success was vacuuming part of her home yesterday to be productive and tidy up her living space.  Camerin reported that she has experienced passive SI without intent or plan today.  She reported that is will continue to follow safety plan today, including calling [phone], [phone], or travelling to the [facility] for crisis intervention if she begins to  develop intent and/or plan to harm herself.       Second Therapeutic Activity: Counselor introduced topic of building social support network today.  Counselor explained how this can be defined as having a having a group of healthy people in one's life you can talk to, spend time with, and get help from to improve both mental and physical health.  Counselor noted that some barriers can make it difficult to connect with other people, including the presence of anxiety or depression, or moving to an unfamiliar area.  Group members were asked to assess the current state of their support network, and identify ways that this could be improved.  Tips were given on how to address previously noted barriers, such as strengthening social skills, using relaxation techniques to reduce anxiety, scheduling social time each week, and/or exploring social events nearby which could increase chances of meeting new supports.  Members were also encouraged to consider getting closer to people they already know through suggestions such as outreaching someone by text, email or phone call if they haven't spoken in awhile, doing something nice for a friend/family member unexpectedly, and/or inviting someone over for a game/movie/dinner night.  Intervention was effective, as evidenced by [name] actively participating in discussion on subject, reporting that her goal is to address rigid boundaries she has set with strangers in order to increase socialization and meet new people.  Yina reported that social anxiety is a barrier for her, and large gatherings can make her nervous, so she hopes therapy will help her build more confidence and coping skills.  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  Aigner reported that this session was helpful for building insight into how she can increase overall support, stating There are  also some toxic people I need to walk away from.     Assessment and Plan: Counselor recommends that Yolette remain in IOP treatment to better manage mental health symptoms, ensure stability and pursue completion of treatment plan goals. Counselor recommends adherence to crisis/safety plan, taking medications as prescribed, and following up with medical professionals if any issues arise.   Follow Up Instructions: Counselor will send Webex link for next session. Alexy was advised to call back or seek an in-person evaluation if the symptoms worsen or if the condition fails to improve as anticipated.   I provided 180 minutes of non-face-to-face time during this encounter.   Shade Flood, LCSW, LCAS 09/06/21

## 2021-09-07 ENCOUNTER — Other Ambulatory Visit (HOSPITAL_COMMUNITY): Payer: 59 | Admitting: Family

## 2021-09-07 ENCOUNTER — Other Ambulatory Visit: Payer: Self-pay

## 2021-09-07 DIAGNOSIS — R45851 Suicidal ideations: Secondary | ICD-10-CM | POA: Diagnosis not present

## 2021-09-07 DIAGNOSIS — F339 Major depressive disorder, recurrent, unspecified: Secondary | ICD-10-CM

## 2021-09-07 NOTE — Progress Notes (Signed)
Virtual Visit via Video Note   I connected with Tracy [name] on 09/07/21 at  9:00 AM EDT by a video enabled telemedicine application and verified that I am speaking with the correct person using two identifiers.   At orientation to the IOP program, Case Manager discussed the limitations of evaluation and management by telemedicine and the availability of in person appointments. The patient expressed understanding and agreed to proceed with virtual visits throughout the duration of the program.   Location:  Patient: Patient Home Provider: [facility] [place] [facility]   History of Present Illness: MDD, treatment resistant    Observations/Objective: Check In: Case Manager checked in with all participants to review discharge dates, insurance authorizations, work-related documents and needs from the treatment team regarding medications. Tracy [name] stated needs and engaged in discussion.    Initial Therapeutic Activity: Counselor facilitated a check-in with Tracy [name] to assess for safety, sobriety and medication compliance.  Counselor also inquired about Tracy [name]'s current emotional ratings, as well as any significant changes in thoughts, feelings or behavior since previous check in.  Tracy [name] presented for session on time and was alert, oriented x5, with no evidence or self-report of active SI/HI or A/V H.  Tracy [name] reported compliance with medication and denied use of alcohol or illicit substances.  Tracy [name] reported scores of 5/10 for depression, 2/10 for anxiety, and 0/10 for anger/irritability.  Tracy [name] denied any recent outbursts or panic attacks.  Tracy [name] reported that a recent success was having the motivation to vacuum two rooms in her home yesterday, as well as take a shower this morning, stating I feel a little better.  Tracy [name] denied any present struggles.  Tracy [name] reported that her goal this weekend is to attend church for the first time in awhile to increase spiritual and community support.       Second Therapeutic Activity:  Counselor continued discussion upon topic of self-care today with group members.  Counselor virtually shared self-care assessment form with members via Webex to complete final sub-categories (i.e. social, spiritual, and professional).  Members were asked to rank their engagement in the activities listed for each dimension on a scale of 1-3, with 1 indicating 'Poor', 2 indicating 'Ok', and 3 indicating 'Well'.  Counselor invited members to share results of this assessment, and inquired about which areas of self-care they are doing well in, as well as areas that require attention, and how they plan to begin addressing this during treatment.  Intervention was effective, as evidenced by Tracy [name] successfully completing final sections of assessment and actively engaging in discussion on subject, reporting that she is excelling in areas such as recognizing things that give meaning to life, acting in accordance with morals and values, participating in a cause that's important to her, appreciating art that's impactful to her, and setting aside time for thought and reflection, but would benefit from focusing more on areas such as meditating, praying, and spending time in nature.  Tracy [name] reported that she would work to improve self-care deficits by establishing a daily guided meditation routine that compliments prayer, taking walks in nature more often with the dogs, attending church again regularly, and listening to uplifting music more often.    Assessment and Plan: Counselor recommends that Tracy [name] remain in IOP treatment to better manage mental health symptoms, ensure stability and pursue completion of treatment plan goals. Counselor recommends adherence to crisis/safety plan, taking medications as prescribed, and following up with medical professionals if any issues arise.   Follow Up Instructions: Counselor will send Webex link  for next session. Tracy [name] was advised to call back or seek an in-person evaluation if the  symptoms worsen or if the condition fails to improve as anticipated.   I provided 180 minutes of non-face-to-face time during this encounter.   Shade Flood, LCSW, LCAS 09/07/21

## 2021-09-08 ENCOUNTER — Encounter (HOSPITAL_COMMUNITY): Payer: Self-pay | Admitting: Family

## 2021-09-08 NOTE — Progress Notes (Signed)
Virtual Visit via Video Note  I connected with Tracy [name] on 09/08/21 at  9:00 AM EST by a video enabled telemedicine application and verified that I am speaking with the correct person using two identifiers.  Location: Patient: Home Provider: Office   I discussed the limitations of evaluation and management by telemedicine and the availability of in person appointments. The patient expressed understanding and agreed to proceed.    I discussed the assessment and treatment plan with the patient. The patient was provided an opportunity to ask questions and all were answered. The patient agreed with the plan and demonstrated an understanding of the instructions.   The patient was advised to call back or seek an in-person evaluation if the symptoms worsen or if the condition fails to improve as anticipated.  I provided 15 minutes of non-face-to-face time during this encounter.   Tracy Center, NP    Psychiatric Initial Adult Assessment   Patient Identification: Tracy [name] MRN:  [id number] Date of Evaluation:  09/07/2021 Referral Source:  Chief Complaint:   Visit Diagnosis:    ICD-10-CM   1. Recurrent major depression resistant to treatment [facility])  F33.9       History of Present Illness: Tracy [name] is 56 year female that presents with passive suicidal ideations.  States "I was just trying to sleep and was not actually trying to harm her self." Reports taking a handful of Klonopin.  She reports multiple stressors related to the passing of her mother and grandmother.  Reports more recently she has been feeling overwhelmed due to strenuous work high demand.  States she is currently followed by therapy and psychiatry at [facility] psychiatric.     Tracy [name] reportes she is currently prescribed Klonopin, Spravato, Lamictal, respite all and hydroxyzine for mood stabilization.  Charted history with generalized anxiety disorder, major depressive disorder, borderline personality  disorder and bipolar disorder.  Reports multiple previous inpatient admissions.  Currently she is denying suicidal or homicidal ideations.  Denies auditory or visual hallucinations.  Patient to start intensive outpatient programming on 09/06/2021  Associated Signs/Symptoms: Depression Symptoms:  depressed mood, difficulty concentrating, hopelessness, (Hypo) Manic Symptoms:  Distractibility, Impulsivity, Anxiety Symptoms:  Excessive Worry, Psychotic Symptoms:  Hallucinations: None PTSD Symptoms: NA  Past Psychiatric History: Reported previous suicide attempt.  During previous inpatient admissions.  Previous Psychotropic Medications: Yes   Substance Abuse History in the last 12 months:  No.  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   Carpal tunnel syndrome 09/06/2015   Chewing difficulty    Chronic left shoulder pain 05/17/2020   Constipation    Coronary artery disease 03/21/2021   Cystic teratoma    BENIGN   Dental crowns present    Diabetes mellitus without complication ([place])    Diabetes mellitus, type 2 ([place]) 06/10/2013   Diarrhea    Diverticulosis of colon without hemorrhage    DOE (dyspnea on exertion) 03/21/2021   Dyspepsia 12/14/2020   Essential hypertension 10/02/2007   Qualifier: Diagnosis of  By: Loanne Drilling MD, Hilliard Clark A    Facial rash 06/16/2019   Female hirsutism 02/09/2015  Food allergy    Walnuts   GAD (generalized anxiety disorder)    Gastritis and gastroduodenitis    Gastroesophageal reflux disease    Grade II internal hemorrhoids    Greater trochanteric bursitis of left hip 07/24/2018   Hemorrhoids, internal 07/18/2017    History of kidney stones    History of migraine headaches    History of posttraumatic stress disorder (PTSD)    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/2015   Hypothyroidism    HYPOTHYROIDISM, POSTSURGICAL 10/02/2007   Qualifier: Diagnosis of  By: Loanne Drilling MD, Sean A    IBS (irritable bowel syndrome)    Infertility associated with anovulation 02/09/2015   Insulin resistance 02/09/2015   Joint pain    Leg swelling 01/24/2978   Lichen planus    Liver cirrhosis secondary to NASH (nonalcoholic steatohepatitis) ([place])    Lower back pain 07/18/2017   MDD (major depressive disorder) 06/06/2015   Migraine 10/02/2007   Morbid obesity with BMI of 40.0-44.9, adult ([place]) 06/23/2020   NASH (nonalcoholic steatohepatitis)    Nausea without vomiting    Nonalcoholic fatty liver disease 02/09/2015   Obstructive sleep apnea 06/27/2014   Oral mucosal lesion 06/17/2019   Osteoarthritis    Other fatigue    PCOS (polycystic ovarian syndrome)    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[place]) 01/01/2012   POLYCYSTIC OVARIAN DISEASE 10/02/2007   Qualifier: Diagnosis of  By: Loanne Drilling MD, Hilliard Clark A    PONV (postoperative nausea and vomiting)    also hx. 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   TOBACCO USE, QUIT 02/08/2010   Qualifier: Diagnosis of  By: Loanne Drilling MD, Sean A    Treatment-resistant depression 10/02/2007   Qualifier: Diagnosis of  By: Loanne Drilling MD, Sean A    Trigeminal neuralgia    Unilateral primary osteoarthritis, left hip 02/10/2019   Vitamin D deficiency     Past Surgical History:  Procedure Laterality Date   ACHILLES TENDON SURGERY Left 2007   APPENDECTOMY     CARPAL TUNNEL RELEASE Right 01/12/2016   Procedure: RIGHT CARPAL TUNNEL RELEASE;   Surgeon: Roseanne Kaufman, MD;  Location: [place];  Service: Orthopedics;  Laterality: Right;   COLONOSCOPY WITH PROPOFOL N/A 04/20/2014   Procedure: COLONOSCOPY WITH PROPOFOL;  Surgeon: Arta Silence, MD;  Location: [facility];  Service: Endoscopy;  Laterality: N/A;   COLONOSCOPY WITH PROPOFOL N/A 01/10/2021   Procedure: COLONOSCOPY WITH PROPOFOL;  Surgeon: Lavena Bullion, DO;  Location: [facility];  Service: Gastroenterology;  Laterality: N/A;   ESOPHAGOGASTRODUODENOSCOPY (EGD) WITH PROPOFOL N/A 04/20/2014   Procedure: ESOPHAGOGASTRODUODENOSCOPY (EGD) WITH PROPOFOL;  Surgeon: Arta Silence, MD;  [facility];  Service: Endoscopy;  Laterality: N/A;   ESOPHAGOGASTRODUODENOSCOPY (EGD) WITH PROPOFOL N/A 01/10/2021   Procedure: ESOPHAGOGASTRODUODENOSCOPY (EGD) WITH PROPOFOL;  Surgeon: Lavena Bullion, DO;  [facility];  Service: Gastroenterology;  Laterality: N/A;   EXCISION HAGLUND'S DEFORMITY WITH ACHILLES TENDON REPAIR Right 02/25/2013   Procedure: RIGHT ACHILLES DEBRIDEMENT AND RECONSTRUCTION;  HAGLUND'S EXCISION; GASTROC RECESSION AND FLEXOR HALLUCIS LONGUS TRANSFER;  Surgeon: Wylene Simmer, MD;  Location: [place];  Service: Orthopedics;  Laterality: Right;   JOINT REPLACEMENT     hip   LUMBAR LAMINECTOMY  X 3   neck fusion     OVARIAN CYST SURGERY Left 1998   PILONIDAL CYST EXCISION  1994   RIGHT OOPHORECTOMY     STERIOD INJECTION Left 01/12/2016   Procedure: STEROID INJECTION LEFT WRIST;  Surgeon: Roseanne Kaufman, MD;  Location: [facility];  Service: Orthopedics;  Laterality: Left;   TOTAL HIP ARTHROPLASTY Left 03/19/2019   Procedure: LEFT TOTAL HIP ARTHROPLASTY ANTERIOR APPROACH;  Surgeon: Mcarthur Rossetti, MD;  Location: [facility];  Service: Orthopedics;  Laterality: Left;   TOTAL THYROIDECTOMY  2003   UPPER GI ENDOSCOPY  01/19/2015    Family Psychiatric History:   Family History:  Family History  Problem  Relation Age of Onset   Heart disease Father    Depression Father    Heart attack Father    Sudden death Father    Anxiety disorder Father    Bipolar disorder Father    Obesity Father    Cancer Brother        THROAT cancer   Depression Brother    Anxiety disorder Brother    Depression Sister    Breast cancer Mother    Hypertension Mother    Cancer Mother    Depression Mother    Sleep apnea Mother    Obesity Mother    Depression Maternal Grandmother    Stroke Maternal Grandfather    Colon cancer Brother    Pancreatic cancer Neg Hx    Stomach cancer Neg Hx    Liver disease Neg Hx     Social History:   Social History   Socioeconomic History   Marital status: Divorced    Spouse name: Not on file   Number of children: 0   Years of education: 16   Highest education level: Not on file  Occupational History   Occupation: RN-Telephone Geophysical data processor: [employer]    Comment: Employee engagement [name]  Tobacco Use   Smoking status: Former    Packs/day: 0.50    Years: 15.00    Pack years: 7.50    Types: Cigarettes    Quit date: 02/2019    Years since quitting: 2.5   Smokeless tobacco: Never  Vaping Use   Vaping Use: Never used  Substance and Sexual Activity   Alcohol use: No    Alcohol/week: 0.0 standard drinks    Comment: Has not had any alcohol since 07/2014 - before this date, she rarely drink.   Drug use: No   Sexual activity: Not Currently    Partners: Male    Birth control/protection: Condom  Other Topics Concern   Not on file  Social History Narrative   RN - [employer]   Regular exercise: no   Caffeine use: 2 x daily   Right-handed.   Lives alone.   Social Determinants of Health   Financial Resource Strain: Not on file  Food Insecurity: Not on file  Transportation Needs: Not on file  Physical Activity: Not on file  Stress: Not on file  Social Connections: Not on file    Additional Social History:   Allergies:   Allergies   Allergen Reactions   Sulfa Antibiotics Itching, Swelling and Rash   Food     Walnut-mouth swelling/itching   Bupropion Hcl Itching and Rash   Codeine Itching   Sulfamethoxazole-Trimethoprim Rash    Metabolic Disorder Labs: Lab Results  Component Value Date   HGBA1C 6.0 (H) 08/02/2021   MPG 126 08/02/2021   MPG  192 12/26/2020   Lab Results  Component Value Date   PROLACTIN 6.2 01/03/2012   Lab Results  Component Value Date   CHOL 112 08/02/2021   TRIG 146 08/02/2021   HDL 33 (L) 08/02/2021   CHOLHDL 3.4 08/02/2021   VLDL 29.6 03/25/2018   LDLCALC 56 08/02/2021   LDLCALC 85 10/11/2020   Lab Results  Component Value Date   TSH 0.19 (L) 08/02/2021    Therapeutic Level Labs: No results found for: LITHIUM No results found for: CBMZ No results found for: VALPROATE  Current Medications: Current Outpatient Medications  Medication Sig Dispense Refill   Armodafinil 200 MG TABS Take 1 tablet by mouth as needed for sleepiness 30 tablet 2   benzonatate (TESSALON) 100 MG capsule Take by mouth 3 (three) times daily as needed for cough.     clonazePAM (KLONOPIN) 1 MG tablet Take 1 tablet by mouth 3 times daily. 90 tablet 2   Continuous Blood Gluc Sensor (DEXCOM G6 SENSOR) MISC Replace every 10 days.  Use to check blood sugar. 9 each 1   Continuous Blood Gluc Transmit (DEXCOM G6 TRANSMITTER) MISC Use to check continuous blood sugar.  Replace every 90 days 1 each 3   cyclobenzaprine (FLEXERIL) 10 MG tablet TAKE 1 TABLET BY MOUTH 2 TIMES DAILY AS NEEDED FOR MUSCLE SPASMS 20 tablet 0   Esketamine HCl, 56 MG Dose, (SPRAVATO, 56 MG DOSE,) 28 MG/DEVICE SOPK Administer 56 mg ( 2 of the 28 mg) intranasally as directed. 2 each 0   Esketamine HCl, 84 MG Dose, (SPRAVATO, 84 MG DOSE,) 28 MG/DEVICE SOPK Administer 84mg  intranasally once a week.  To be administered under the supervision of a healthcare professional. 6 each 5   ferrous sulfate 325 (65 FE) MG EC tablet Take 1 tablet (325 mg total)  by mouth in the morning and at bedtime. Take iron supplement 2 hours before or 4 hours after PPI or other antacids as this requires some level of gastric acidity to aid in absorption 60 tablet 5   folic acid (FOLVITE) 1 MG tablet Take 1 tablet (1 mg total) by mouth daily. 90 tablet 0   furosemide (LASIX) 40 MG tablet TAKE 1 TABLET BY MOUTH DAILY. 30 tablet 3   glucose blood (FREESTYLE LITE) test strip Use as instructed 100 each 12   hydrOXYzine (ATARAX) 25 MG tablet Take 1 tablet (25 mg total) by mouth 3 (three) times daily as needed for anxiety. 30 tablet 1   insulin degludec (TRESIBA FLEXTOUCH) 100 UNIT/ML FlexTouch Pen Inject 30 Units into the skin 2 times daily. 54 mL 3   insulin lispro (HUMALOG KWIKPEN) 100 UNIT/ML KwikPen Inject up to 16 units 3 times daily at mealtime per sliding scale. 15 mL 11   Insulin Pen Needle (UNIFINE PENTIPS) 31G X 5 MM MISC Use as directed to inject insulin 100 each 3   lamoTRIgine (LAMICTAL) 200 MG tablet Take 2 tablets by mouth at bedtime. 180 tablet 0   lidocaine (XYLOCAINE) 5 % ointment Apply 1 application topically 3 (three) times daily as needed for mouth pain. Or sores     lisinopril (ZESTRIL) 10 MG tablet Take 1 tablet (10 mg total) by mouth daily. 90 tablet 3   meloxicam (MOBIC) 15 MG tablet Take one tablet by mouth every morning with a meal for 2 weeks, then daily as needed for pain. 30 tablet 0   NUVIGIL 200 MG TABS Take 1 tablet by mouth daily as needed (sleepiness). 30 tablet 2  nystatin (MYCOSTATIN/NYSTOP) powder Apply 3 times daily as directed. (Patient taking differently: Apply 1 application topically daily.) 60 g 3   ondansetron (ZOFRAN-ODT) 4 MG disintegrating tablet Dissolve 1 tablet (4 mg total) by mouth 2 (two) times daily as needed for nausea or vomiting. 20 tablet 1   pantoprazole (PROTONIX) 40 MG tablet Take 2 tablets (80 mg total) by mouth 2 (two) times daily. 60 tablet 3   risperiDONE (RISPERDAL) 1 MG tablet Take 1 tablet (1 mg total) by  mouth at bedtime. 90 tablet 1   rizatriptan (MAXALT) 10 MG tablet Take 1 tablet (10 mg total) by mouth as needed for migraine. May repeat in 2 hours if needed (Patient taking differently: Take 10 mg by mouth every 2 (two) hours as needed for migraine. May repeat in 2 hours if needed) 10 tablet 0   rosuvastatin (CRESTOR) 20 MG tablet TAKE 1 TABLET BY MOUTH DAILY. 90 tablet 3   Semaglutide (RYBELSUS) 14 MG TABS Take 1 tablet by mouth daily. 90 tablet 1   sertraline (ZOLOFT) 100 MG tablet TAKE 2 TABLETS BY MOUTH EVERY MORNING 180 tablet 3   spironolactone (ALDACTONE) 50 MG tablet Take 1 tablet (50 mg total) by mouth 2 (two) times daily. 60 tablet 2   sucralfate (CARAFATE) 1 g tablet Take 1 g by mouth 4 (four) times daily.     SYNTHROID 300 MCG tablet TAKE 1 TABLET BY MOUTH ONCE A DAY BEFORE BREAKFAST 90 tablet 1   Vitamin D, Ergocalciferol, (DRISDOL) 1.25 MG (50000 UNIT) CAPS capsule TAKE 1 CAPSULE BY MOUTH EVERY 7 DAYS. 12 capsule 1   No current facility-administered medications for this visit.    Musculoskeletal: Strength & Muscle Tone: within normal limits Gait & Station: normal Patient leans: N/A  Psychiatric Specialty Exam: Review of Systems  Last menstrual period 08/24/2020.There is no height or weight on file to calculate BMI.  General Appearance: Casual  Eye Contact:  Good  Speech:  Clear and Coherent  Volume:  Normal  Mood:  Anxious and Depressed  Affect:  Congruent  Thought Process:  Coherent  Orientation:  Full (Time, Place, and Person)  Thought Content:  Logical  Suicidal Thoughts:  No  Homicidal Thoughts:  No  Memory:  Immediate;   Fair Recent;   Fair  Judgement:  Good  Insight:  Good  Psychomotor Activity:  Normal  Concentration:  Concentration: Good  Recall:  Good  Fund of Knowledge:Good  Language: Good  Akathisia:  No  Handed:  Right  AIMS (if indicated):  done  Assets:  Communication Skills  ADL's:  Intact  Cognition: WNL  Sleep:  Good    Screenings: AIMS    Flowsheet Row Admission (Discharged) from 06/05/2015 in [facility] Total Score 0      AUDIT    Flowsheet Row Admission (Discharged) from 06/05/2015 in [facility]  Alcohol Use Disorder Identification Test Final Score (AUDIT) 0      GAD-7    Flowsheet Row Office Visit from 03/14/2021 in [facility] Office Visit from 02/14/2021 in [facility] Visit from 03/01/2020 in [facility]  Total GAD-7 Score 17 12 11       PHQ2-9    Flowsheet Row Counselor from 09/04/2021 in [facility] Office Visit from 03/14/2021 in [name] Visit from 02/14/2021 in [facility] Visit from 10/11/2020 in [facility] Office Visit from 03/01/2020 in [facility]  PHQ-2 Total Score 6 6 6 6 3   PHQ-9 Total Score 22 19 21 19 9       Flowsheet Row Counselor from 09/04/2021 in [facility] ED from 02/11/2021 in [facility] from 02/10/2021 in [facility]  C-SSRS RISK CATEGORY Error: Question 6 not populated No Risk No Risk       Assessment and Plan:  Patient to start intensive outpatient program Continue medications as indicated  Treatment plan was reviewed and agreed upon by NP T. Bobby Rumpf and patient Cherith  Ellington's need for group services   Tracy Center, NP 2/4/20236:37 PM

## 2021-09-10 ENCOUNTER — Other Ambulatory Visit: Payer: Self-pay

## 2021-09-10 ENCOUNTER — Other Ambulatory Visit (HOSPITAL_COMMUNITY): Payer: 59 | Admitting: Licensed Clinical Social Worker

## 2021-09-10 ENCOUNTER — Telehealth: Payer: Self-pay

## 2021-09-10 DIAGNOSIS — F339 Major depressive disorder, recurrent, unspecified: Secondary | ICD-10-CM

## 2021-09-10 DIAGNOSIS — R45851 Suicidal ideations: Secondary | ICD-10-CM | POA: Diagnosis not present

## 2021-09-10 NOTE — Telephone Encounter (Signed)
Paper work completed just need to verify dates on ending IOP

## 2021-09-10 NOTE — Progress Notes (Signed)
Virtual Visit via Video Note   I connected with [name] on 09/10/21 at  9:00 AM EDT by a video enabled telemedicine application and verified that I am speaking with the correct person using two identifiers.   At orientation to the IOP program, Case Manager discussed the limitations of evaluation and management by telemedicine and the availability of in person appointments. The patient expressed understanding and agreed to proceed with virtual visits throughout the duration of the program.   Location:  Patient: Patient Home Provider: [facility] Office   History of Present Illness: MDD, treatment resistant    Observations/Objective: Check In: Case Manager checked in with all participants to review discharge dates, insurance authorizations, work-related documents and needs from the treatment team regarding medications. Mieko stated needs and engaged in discussion.    Initial Therapeutic Activity: Counselor facilitated a check-in with Melek to assess for safety, sobriety and medication compliance.  Counselor also inquired about Trulee's current emotional ratings, as well as any significant changes in thoughts, feelings or behavior since previous check in.  Albina presented for session on time and was alert, oriented x5, with no evidence or self-report of active SI/HI or A/V H.  Aziza reported compliance with medication and denied use of alcohol or illicit substances.  Haru reported scores of 7/10 for depression, 5/10 for anxiety, and 0/10 for anger/irritability.  Maythe denied any recent outbursts or panic attacks.  Lakedra reported that a recent success was seeing an ultrasound picture of her niece's baby, stating It gave me hope and brought a smile to my face.  Demika reported that a recent struggle was experiencing passive SI without intent or plan on Saturday, although she was able to distract herself until they passed by focusing on cleaning the kitchen.  Carrianne reported that her goal is to speak  with the NP about starting medication for ADD/ADHD since she has been struggling with focus for years.       Second Therapeutic Activity: Counselor introduced topic of grounding skills today.  Counselor defined these as simple strategies one can use to help detach from difficult thoughts or feelings temporarily by focusing on something else.  Counselor noted that grounding will not solve the problem at hand, but can provide the practitioner with time to regain control over their thoughts and/or feelings and prevent the situation from getting worse (i.e. interrupting a panic attack).  Counselor divided these into three categories (mental, physical, and soothing) and then provided examples of each which group members could practice during session.  Some of these included describing one's environment in detail or playing a categories game with oneself for mental category, taking a hot bath/shower, stretching, or carrying a grounding object for physical category, and saying kind statements, or visualizing people one cares about for soothing category.  Counselor inquired about which techniques members have used with success in the past, or will commit to learning, practicing, and applying now to improve coping abilities.  Intervention was effective, as evidenced by [name] participating in discussion on the subject, trying out several of the techniques during session, and expressing interest in adding several to her available coping skills, such as playing a categories game listing types of medical conditions or breeds of dogs, counting to 10, playing with her hair, saying kind statements to herself such as You're doing the best you can, listening to music that is calming , reading inspiring quotes, or looking at pictures of her nieces or nephews.    Assessment and Plan:  Counselor recommends that Gearldene remain in IOP treatment to better manage mental health symptoms, ensure stability and pursue completion of  treatment plan goals. Counselor recommends adherence to crisis/safety plan, taking medications as prescribed, and following up with medical professionals if any issues arise.   Follow Up Instructions: Counselor will send Webex link for next session. Kanasia was advised to call back or seek an in-person evaluation if the symptoms worsen or if the condition fails to improve as anticipated.   I provided 180 minutes of non-face-to-face time during this encounter.   Shade Flood, LCSW, LCAS 09/10/21

## 2021-09-10 NOTE — Telephone Encounter (Signed)
Received fax from [employer] re: Corky Sox. Completion needed for FMLA form. Placed in [name]'s box.

## 2021-09-10 NOTE — Telephone Encounter (Signed)
Tracy [name], can you contact her to confirm her dates beginning 09/08/2021 through ? I thought it was 8 weeks but want to make sure

## 2021-09-11 ENCOUNTER — Other Ambulatory Visit: Payer: Self-pay

## 2021-09-11 ENCOUNTER — Other Ambulatory Visit (HOSPITAL_COMMUNITY): Payer: 59 | Admitting: Licensed Clinical Social Worker

## 2021-09-11 DIAGNOSIS — F339 Major depressive disorder, recurrent, unspecified: Secondary | ICD-10-CM | POA: Diagnosis not present

## 2021-09-11 DIAGNOSIS — R45851 Suicidal ideations: Secondary | ICD-10-CM | POA: Diagnosis not present

## 2021-09-11 DIAGNOSIS — Z0289 Encounter for other administrative examinations: Secondary | ICD-10-CM

## 2021-09-11 NOTE — Progress Notes (Signed)
Tracy [name] reported " I think I have ADHD and would like to start a medication for that." Stated that she was followed by RependerEdwyna Perfect and she had discussed this dx at that time, but never followed up. Reports inability to focus and/or concentrate.  States ongoing forgetfulness which has been concerning to her.  States her mother has a history of dementia which she recently discovered.  States " I know that my depression could play a part in her inability to concentrate" .  Discussed following up with neurology and primary care provider for additional outpatient testing. Patient was receptive to plan. Patient to continue intensive outpatient programming.

## 2021-09-11 NOTE — Telephone Encounter (Signed)
Please review

## 2021-09-11 NOTE — Progress Notes (Signed)
Virtual Visit via Video Note   I connected with Tracy [name] on 09/11/21 at  9:00 AM EDT by a video enabled telemedicine application and verified that I am speaking with the correct person using two identifiers.   At orientation to the IOP program, Case Manager discussed the limitations of evaluation and management by telemedicine and the availability of in person appointments. The patient expressed understanding and agreed to proceed with virtual visits throughout the duration of the program.   Location:  Patient: Patient Home Provider: [facility] [place] [facility]   History of Present Illness: MDD, treatment resistant    Observations/Objective: Check In: Case Manager checked in with all participants to review discharge dates, insurance authorizations, work-related documents and needs from the treatment team regarding medications. Tracy [name] stated needs and engaged in discussion.    Initial Therapeutic Activity: Counselor facilitated a check-in with Tracy [name] to assess for safety, sobriety and medication compliance.  Counselor also inquired about Tracy [name]'s current emotional ratings, as well as any significant changes in thoughts, feelings or behavior since previous check in.  Tracy [name] presented for session on time and was alert, oriented x5, with no evidence or self-report of active SI/HI or A/V H.  Tracy [name] reported compliance with medication and denied use of alcohol or illicit substances.  Tracy [name] reported scores of 4/10 for depression, 0/10 for anxiety, and 0/10 for anger/irritability.  Tracy [name] denied any recent outbursts or panic attacks.  Tracy [name] reported that a recent success was doing some cleaning in the kitchen yesterday to be productive.  Tracy [name] reported that a recent struggle was experiencing congestion this morning, stating I'm a little under the weather.  Tracy [name] was offered the chance to be excused to rest, but declined, stating I get a lot from group.  Tracy [name] reported that her goal this afternoon is to start a  journal for self-care.         Second Therapeutic Activity: Counselor engaged the group in discussion on managing work/life balance today to improve mental health and wellness.  Counselor explained how finding balance between responsibilities at home and work place can be challenging, lead to increased stress, and this has been further complicated by recent pandemic leading to unemployment, more virtual work, and blurring of lines between home as a place of rest or work duties.  Counselor facilitated discussion on what challenges members have faced with this issue historically, as well as what, if any, issues have arisen following pandemic. Counselor also discussed strategies for improving work/life balance while members work on their mental health during treatment.  Some of these included keeping track of time management; creating a list of priorities and scaling importance; setting realistic, measurable goals each day; establishing boundaries; taking care of health needs; and nurturing relationships at home and work for support.  Counselor inquired about areas where members feel they are excelling, as well as areas they could focus on during treatment. Intervention was effective, as evidenced by Tracy [name] actively engaging in discussion on subject, reporting that she has dealt with burnout from previous job role due to being in a hostile work environment, which led to symptoms such as feeling tired and drained, lowered immunity, headaches, decreased sleep, decreased appetite, feeling overwhelmed, self-doubt, and isolation.  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  Tracy [name] reported that she will work to correct work life imbalance by planning  out her day ahead each evening in order to prioritize tasks and curb anxiety, avoid working through her work breaks,  leave work at work by setting a boundary, improve refusal skills in order to say No to unreasonable requests, and increase more time spent on self-care activities each day to ensure stress outlet.     Assessment and Plan: Counselor recommends that Tracy [name] remain in IOP treatment to better manage mental health symptoms, ensure stability and pursue completion of treatment plan goals. Counselor recommends adherence to crisis/safety plan, taking medications as prescribed, and following up with medical professionals if any issues arise.   Follow Up Instructions: Counselor will send Webex link for next session. Tracy [name] was advised to call back or seek an in-person evaluation if the symptoms worsen or if the condition fails to improve as anticipated.   I provided 180 minutes of non-face-to-face time during this encounter.   Shade Flood, LCSW, LCAS 09/11/21

## 2021-09-11 NOTE — Telephone Encounter (Signed)
Thanks so much. 

## 2021-09-11 NOTE — Telephone Encounter (Signed)
Patient said her first missed day was 1/29. Her first day of treatment was 2/1. Right now her last day of treatment is to be 2/27. The people at [facility] told her it was no problem to change the date if needed, so would be safe to use 2/27.

## 2021-09-11 NOTE — Telephone Encounter (Signed)
This was already faxed this morning

## 2021-09-11 NOTE — Telephone Encounter (Signed)
Tracy [name] stating [employer] has not received the medical certification on the [facility] paperwork. She inquiring about the status of this. # [phone].

## 2021-09-12 ENCOUNTER — Other Ambulatory Visit (HOSPITAL_COMMUNITY): Payer: 59 | Admitting: Licensed Clinical Social Worker

## 2021-09-12 DIAGNOSIS — F339 Major depressive disorder, recurrent, unspecified: Secondary | ICD-10-CM

## 2021-09-12 DIAGNOSIS — R45851 Suicidal ideations: Secondary | ICD-10-CM | POA: Diagnosis not present

## 2021-09-12 NOTE — Progress Notes (Signed)
Virtual Visit via Video Note   I connected with Tracy [name] on 09/12/21 at  9:00 AM EDT by a video enabled telemedicine application and verified that I am speaking with the correct person using two identifiers.   At orientation to the IOP program, Case Manager discussed the limitations of evaluation and management by telemedicine and the availability of in person appointments. The patient expressed understanding and agreed to proceed with virtual visits throughout the duration of the program.   Location:  Patient: Patient Home Provider: [facility] [place] [facility]   History of Present Illness: MDD, treatment resistant    Observations/Objective: Check In: Case Manager checked in with all participants to review discharge dates, insurance authorizations, work-related documents and needs from the treatment team regarding medications. Tracy [name] stated needs and engaged in discussion.    Initial Therapeutic Activity: Counselor facilitated a check-in with Tracy [name] to assess for safety, sobriety and medication compliance.  Counselor also inquired about Tracy [name]'s current emotional ratings, as well as any significant changes in thoughts, feelings or behavior since previous check in.  Tracy [name] presented for session on time and was alert, oriented x5, with no evidence or self-report of active SI/HI or A/V H.  Tracy [name] reported compliance with medication and denied use of alcohol or illicit substances.  Tracy [name] reported scores of 8/10 for depression, 5/10 for anxiety, and 0/10 for anger/irritability.  Tracy [name] denied any recent panic attacks.  Tracy [name] reported that a recent success was getting the chance to see pictures of her pregnant niece, stating We are very close and I'm happy for her.  Tracy [name] reported that a recent struggle was talking to her sister in law last night, stating I got frustrated, but I know she was trying to be helpful.  Tracy [name] reported that she became more upset when her ex sent her a video via text that he thought would  be helpful, but only reminded her of the lack of support that was present in their past relationship.  Tracy [name] also reported that she experienced passive SI without intent or plan yesterday afternoon, but engaged in distraction activities effectively until these passed.  She reported that she will continue to follow safety plan to avoid self-harm.     Second Therapeutic Activity: Counselor introduced Tracy [name], [employer] Pharmacist, to provide psychoeducation on topic of medication compliance with members today.  Tracy [name] provided psychoeducation on classes of medications such as antidepressants, antipsychotics, what symptoms they are intended to treat, and any side effects one might encounter while on a particular prescription.  Time was allowed for clients to ask any questions they might have of Tracy [name] regarding this specialty.  Intervention was effective, as evidenced by Tracy [name] actively listening to information that was presented, although she did not participate in conversation.    Third Therapeutic Activity: Counselor provided demonstration of relaxation technique known as mindful breathing to help members increase sense of calm, resiliency, and control.  Counselor guided members through process of getting comfortable, achieving a relaxed breathing rhythm, and focusing on this for several minutes, allowing troubling thoughts and feelings to come and go without rumination.  Counselor processed effectiveness of activity afterward in discussion with members, including how this impacted their mental state, whether it was difficult to stay focused, and if they plan to include it in self-care routine to improve day-to-day coping.  Intervention was effective, as evidenced by Tracy [name] participating in exercise successfully, and stating I liked it. It helps if someone guides me because so many thoughts that come in that it  can be hard to focus at times.  She reported that she finds focusing on her breath to be helpful for  calming her, especially when facing when severe anxiety symptoms such as chest pain, so she will incorporate this into coping skills.    Assessment and Plan: Counselor recommends that Tracy [name] remain in IOP treatment to better manage mental health symptoms, ensure stability and pursue completion of treatment plan goals. Counselor recommends adherence to crisis/safety plan, taking medications as prescribed, and following up with medical professionals if any issues arise.   Follow Up Instructions: Counselor will send Webex link for next session. Tracy [name] was advised to call back or seek an in-person evaluation if the symptoms worsen or if the condition fails to improve as anticipated.   I provided 180 minutes of non-face-to-face time during this encounter.   Shade Flood, LCSW, LCAS 09/12/21

## 2021-09-13 ENCOUNTER — Other Ambulatory Visit (HOSPITAL_COMMUNITY): Payer: 59 | Admitting: Licensed Clinical Social Worker

## 2021-09-13 ENCOUNTER — Other Ambulatory Visit: Payer: Self-pay

## 2021-09-13 DIAGNOSIS — F339 Major depressive disorder, recurrent, unspecified: Secondary | ICD-10-CM | POA: Diagnosis not present

## 2021-09-13 DIAGNOSIS — R45851 Suicidal ideations: Secondary | ICD-10-CM | POA: Diagnosis not present

## 2021-09-13 NOTE — Progress Notes (Signed)
Virtual Visit via Video Note   I connected with [name] on 09/13/21 at  9:00 AM EDT by a video enabled telemedicine application and verified that I am speaking with the correct person using two identifiers.   At orientation to the IOP program, Case Manager discussed the limitations of evaluation and management by telemedicine and the availability of in person appointments. The patient expressed understanding and agreed to proceed with virtual visits throughout the duration of the program.   Location:  Patient: Patient Home Provider: [facility] [place] [facility]   History of Present Illness: MDD, treatment resistant    Observations/Objective: Check In: Case Manager checked in with all participants to review discharge dates, insurance authorizations, work-related documents and needs from the treatment team regarding medications. Toyia stated needs and engaged in discussion.    Initial Therapeutic Activity: Counselor facilitated a check-in with Tayjah to assess for safety, sobriety and medication compliance.  Counselor also inquired about Ahilyn's current emotional ratings, as well as any significant changes in thoughts, feelings or behavior since previous check in.  Jacquelin presented for session on time and was alert, oriented x5, with no evidence or self-report of active SI/HI or A/V H.  Emmajo reported compliance with medication and denied use of alcohol or illicit substances.  Angely reported scores of 9/10 for depression, 8/10 for anxiety, and 7/10 for anger/irritability.  Jermaine denied any recent outbursts or panic attacks.  Charlesetta reported that a recent success was getting outside of the home and doing some shopping.  Da reported that a recent struggle was getting an email this morning that irritated her, stating We inherited 48 acres and we're trying to figure out what to do because we have so many family members.  I don't have the money for a down payment though and I don't know what I'm gonna do.   Saul reported that her goal is to eventually start journaling in order to work on expressing her feelings in healthier ways.   Second Therapeutic Activity: Counselor covered topic of core beliefs with group today.  Counselor virtually shared a handout on the subject, which explained how everyone looks at the world differently, and two people can have the same experience, but have different interpretations of what happened.  Members were encouraged to think of these like sunglasses with different shades influencing perception towards positive or negative outcomes.  Examples of negative core beliefs were provided, such as I'm unlovable, I'm not good enough, and I'm a bad person.  Members were asked to share which one(s) they could relate to, and then identify evidence which contradicts these beliefs.  Intervention was effective, as evidenced by [name] successfully participating in discussion on the subject and reporting that she could relate to several negative core beliefs listed on the handout, such as I am trapped, I will end up alone, I'm worthless, The world is dangerous, and Nothing ever goes right.  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    Assessment and Plan: Counselor recommends that Analynn remain in IOP treatment to better manage mental health symptoms, ensure stability and pursue completion of treatment plan goals. Counselor recommends adherence to crisis/safety plan, taking medications as prescribed, and following up with medical professionals if any issues arise.   Follow Up Instructions: Counselor will send  Webex link for next session. Lekesha was advised to call back or seek an in-person evaluation if the symptoms worsen or if the  condition fails to improve as anticipated.   I provided 180 minutes of non-face-to-face time during this encounter.   Shade Flood, LCSW, LCAS 09/13/21

## 2021-09-14 ENCOUNTER — Other Ambulatory Visit (HOSPITAL_COMMUNITY): Payer: Self-pay

## 2021-09-14 ENCOUNTER — Other Ambulatory Visit: Payer: Self-pay

## 2021-09-14 ENCOUNTER — Ambulatory Visit (HOSPITAL_COMMUNITY): Payer: 59

## 2021-09-14 ENCOUNTER — Ambulatory Visit: Payer: 59 | Admitting: Physician Assistant

## 2021-09-14 ENCOUNTER — Encounter: Payer: Self-pay | Admitting: Physician Assistant

## 2021-09-14 VITALS — BP 119/67 | HR 85 | Ht 71.0 in | Wt 315.0 lb

## 2021-09-14 DIAGNOSIS — E1165 Type 2 diabetes mellitus with hyperglycemia: Secondary | ICD-10-CM | POA: Diagnosis not present

## 2021-09-14 DIAGNOSIS — N764 Abscess of vulva: Secondary | ICD-10-CM

## 2021-09-14 DIAGNOSIS — Z794 Long term (current) use of insulin: Secondary | ICD-10-CM | POA: Diagnosis not present

## 2021-09-14 MED ORDER — DOXYCYCLINE HYCLATE 100 MG PO TABS
100.0000 mg | ORAL_TABLET | Freq: Two times a day (BID) | ORAL | 0 refills | Status: DC
  Filled 2021-09-14: qty 20, 10d supply, fill #0

## 2021-09-14 MED ORDER — TRAMADOL HCL 50 MG PO TABS
50.0000 mg | ORAL_TABLET | Freq: Three times a day (TID) | ORAL | 0 refills | Status: DC | PRN
  Filled 2021-09-14: qty 15, 5d supply, fill #0

## 2021-09-14 NOTE — Patient Instructions (Signed)
Skin Abscess  A skin abscess is an infected area on or under your skin that contains a collection of pus and other material. An abscess may also be called a furuncle,carbuncle, or boil. An abscess can occur in or on almost any part of your body. Some abscesses break open (rupture) on their own. Most continue to get worse unless they are treated. The infection can spread deeper into the body and eventually into your blood, whichcan make you feel ill. Treatment usually involves draining the abscess. What are the causes? An abscess occurs when germs, like bacteria, pass through your skin and cause an infection. This may be caused by: A scrape or cut on your skin. A puncture wound through your skin, including a needle injection or insect bite. Blocked oil or sweat glands. Blocked and infected hair follicles. A cyst that forms beneath your skin (sebaceous cyst) and becomes infected. What increases the risk? This condition is more likely to develop in people who: Have a weak body defense system (immune system). Have diabetes. Have dry and irritated skin. Get frequent injections or use illegal IV drugs. Have a foreign body in a wound, such as a splinter. Have problems with their lymph system or veins. What are the signs or symptoms? Symptoms of this condition include: A painful, firm bump under the skin. A bump with pus at the top. This may break through the skin and drain. Other symptoms include: Redness surrounding the abscess site. Warmth. Swelling of the lymph nodes (glands) near the abscess. Tenderness. A sore on the skin. How is this diagnosed? This condition may be diagnosed based on: A physical exam. Your medical history. A sample of pus. This may be used to find out what is causing the infection. Blood tests. Imaging tests, such as an ultrasound, CT scan, or MRI. How is this treated? A small abscess that drains on its own may not need treatment. Treatment for larger abscesses  may include: Moist heat or heat pack applied to the area several times a day. A procedure to drain the abscess (incision and drainage). Antibiotic medicines. For a severe abscess, you may first get antibiotics through an IV and then change to antibiotics by mouth. Follow these instructions at home: Medicines  Take over-the-counter and prescription medicines only as told by your health care provider. If you were prescribed an antibiotic medicine, take it as told by your health care provider. Do not stop taking the antibiotic even if you start to feel better.  Abscess care  If you have an abscess that has not drained, apply heat to the affected area. Use the heat source that your health care provider recommends, such as a moist heat pack or a heating pad. Place a towel between your skin and the heat source. Leave the heat on for 20-30 minutes. Remove the heat if your skin turns bright red. This is especially important if you are unable to feel pain, heat, or cold. You may have a greater risk of getting burned. Follow instructions from your health care provider about how to take care of your abscess. Make sure you: Cover the abscess with a bandage (dressing). Change your dressing or gauze as told by your health care provider. Wash your hands with soap and water before you change the dressing or gauze. If soap and water are not available, use hand sanitizer. Check your abscess every day for signs of a worsening infection. Check for: More redness, swelling, or pain. More fluid or blood. Warmth. More   pus or a bad smell.  General instructions To avoid spreading the infection: Do not share personal care items, towels, or hot tubs with others. Avoid making skin contact with other people. Keep all follow-up visits as told by your health care provider. This is important. Contact a health care provider if you have: More redness, swelling, or pain around your abscess. More fluid or blood coming  from your abscess. Warm skin around your abscess. More pus or a bad smell coming from your abscess. A fever. Muscle aches. Chills or a general ill feeling. Get help right away if you: Have severe pain. See red streaks on your skin spreading away from the abscess. Summary A skin abscess is an infected area on or under your skin that contains a collection of pus and other material. A small abscess that drains on its own may not need treatment. Treatment for larger abscesses may include having a procedure to drain the abscess and taking an antibiotic. This information is not intended to replace advice given to you by your health care provider. Make sure you discuss any questions you have with your healthcare provider. Document Revised: 11/12/2018 Document Reviewed: 09/04/2017 [employer] Patient Education  2022 [employer]  

## 2021-09-14 NOTE — Progress Notes (Signed)
Subjective:    Patient ID: Tracy [name], female    DOB: 25-Nov-1965, 56 y.o.   MRN: [id number]  HPI Pt is a 56 yo obese female with painful abscess over left vulva area for the last week. No hx of abscess. Pt is a diabetic. It is very painful and draining. She is doing sitz baths 2-3 times a day. No fever, chills, nausea, vomiting.   .. 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[place]) 01/01/2012   Diabetes mellitus, type 2 ([place]) 06/10/2013   Obstructive sleep apnea 06/27/2014   Anxiety and depression 10/31/2014   Insulin resistance 02/09/2015   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/2015   Acanthosis nigricans, acquired 77/93/9030   Nonalcoholic fatty liver disease 02/09/2015   Female hirsutism 02/09/2015   Infertility associated with anovulation 02/09/2015   Severe episode of recurrent major depressive disorder, without psychotic features ([place])    History of posttraumatic stress disorder (PTSD)    GAD (generalized anxiety disorder)    MDD (major depressive disorder) 06/06/2015   Carpal tunnel syndrome 09/06/2015   Bipolar disorder ([place]) 11/28/2015   Borderline personality disorder ([place]) 11/28/2015   Trigeminal neuralgia 01/18/2016   Lower back pain 07/18/2017   Hemorrhoids, internal 07/18/2017   Vitamin D deficiency 01/04/2018   Arthritis of left hip 06/25/2018   Greater trochanteric bursitis of left hip 07/24/2018   Unilateral primary osteoarthritis, left hip 02/10/2019   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   Primary osteoarthritis of left knee 01/21/2018   Facial rash 06/16/2019   Oral mucosal lesion 06/17/2019   Chronic left  shoulder pain 05/17/2020   Morbid obesity with BMI of 40.0-44.9, adult ([place]) 06/23/2020   Leg swelling 09/21/2020   Dyspepsia 12/14/2020   Diarrhea    Gastroesophageal reflux disease    Nausea without vomiting    Gastritis and gastroduodenitis    Diverticulosis of colon without hemorrhage    Grade II internal hemorrhoids    DOE (dyspnea on exertion) 03/21/2021   Coronary artery disease 03/21/2021   Arthritis 08/09/2021   Bilateral swelling of feet and ankles 08/09/2021   Cancer ([place]) 08/09/2021   Chewing difficulty 08/09/2021   Constipation 08/09/2021   Dental crowns present 08/09/2021   Diabetes mellitus without complication ([place]) 04/27/3006   Food allergy 08/09/2021   History of kidney stones 08/09/2021   History of migraine headaches 08/09/2021   Hypothyroidism 08/09/2021   IBS (irritable bowel syndrome) 08/09/2021   Joint pain 62/26/3335   Lichen planus 45/62/5638   Liver cirrhosis secondary to NASH (nonalcoholic steatohepatitis) ([place]) 08/09/2021   NASH (nonalcoholic steatohepatitis) 08/09/2021   Osteoarthritis 08/09/2021   Other fatigue 08/09/2021   PONV (postoperative nausea and vomiting) 08/09/2021   Shortness of breath on exertion 08/09/2021   Resolved Ambulatory Problems    Diagnosis Date Noted   Elevated cholesterol 10/02/2007   HYPERGLYCEMIA 10/02/2007   UTI'S, HX OF 10/02/2007   Infertility, female    Chest pain 06/27/2014   Chest pain at rest 10/31/2014   Morbid obesity ([place]) 10/31/2014   Prediabetes 02/09/2015   Hepatic cirrhosis ([place]) 02/09/2015   Oligomenorrhea 02/09/2015   Suicidal ideation 06/05/2015   Encounter for general adult medical examination  with abnormal findings 01/04/2018   Healthcare maintenance 04/22/2019   UTI (urinary tract infection) 04/22/2019   Dog bite of buttock, initial encounter 06/16/2019   Yeast dermatitis 03/01/2020   Neck swelling 16/05/9603   Periumbilical pain 54/04/8118   Generalized abdominal pain    Elevated blood  sugar 02/11/2021   Palpitations 02/14/2021   No Additional Past Medical History      Review of Systems    See HPI.  Objective:   Physical Exam  2cm by 1.5cm of induration and warmth, redness and tenderness. Central area of pus and drainage.       Assessment & Plan:  Tracy [name] KitchenMarland KitchenTnia was seen today for vaginal pain.  Diagnoses and all orders for this visit:  Vulvar abscess -     doxycycline (VIBRA-TABS) 100 MG tablet; Take 1 tablet (100 mg total) by mouth 2 (two) times daily for 10 days. -     traMADol (ULTRAM) 50 MG tablet; Take 1 tablet (50 mg total) by mouth every 8 (eight) hours as needed for up to 5 days.  Type 2 diabetes mellitus with hyperglycemia, with long-term current use of insulin ([place]) -     Ambulatory referral to Ophthalmology   Referral for DM eye exam.   Discussed abscess it is actively draining. Clean with hibclens, continue sitz baths, start doxycycline. Tramadol for pain. Call on Monday if not improving and will send to general surgery for abscess I and D.

## 2021-09-17 ENCOUNTER — Other Ambulatory Visit: Payer: Self-pay

## 2021-09-17 ENCOUNTER — Other Ambulatory Visit (HOSPITAL_COMMUNITY): Payer: 59 | Admitting: Licensed Clinical Social Worker

## 2021-09-17 DIAGNOSIS — F339 Major depressive disorder, recurrent, unspecified: Secondary | ICD-10-CM

## 2021-09-17 DIAGNOSIS — R45851 Suicidal ideations: Secondary | ICD-10-CM | POA: Diagnosis not present

## 2021-09-17 NOTE — Progress Notes (Signed)
Virtual Visit via Video Note   I connected with Tracy [name] on 09/17/21 at  9:00 AM EDT by a video enabled telemedicine application and verified that I am speaking with the correct person using two identifiers.   At orientation to the IOP program, Case Manager discussed the limitations of evaluation and management by telemedicine and the availability of in person appointments. The patient expressed understanding and agreed to proceed with virtual visits throughout the duration of the program.   Location:  Patient: Patient Home Provider: Clinician Home Office   History of Present Illness: MDD, treatment resistant    Observations/Objective: Check In: Case Manager checked in with all participants to review discharge dates, insurance authorizations, work-related documents and needs from the treatment team regarding medications. Tracy [name] stated needs and engaged in discussion.    Initial Therapeutic Activity: Counselor facilitated a check-in with Tracy [name] to assess for safety, sobriety and medication compliance.  Counselor also inquired about Tracy [name]'s current emotional ratings, as well as any significant changes in thoughts, feelings or behavior since previous check in.  Tracy [name] presented for session on time and was alert, oriented x5, with no evidence or self-report of active SI/HI or A/V H.  Tracy [name] reported compliance with medication and denied use of alcohol or illicit substances.  Tracy [name] reported scores of 5/10 for depression, 7/10 for anxiety, and 5/10 for anger/irritability.  Tracy [name] denied any recent outbursts or panic attacks.  Tracy [name] reported that a recent success was attending a doctor's appointment over the weekend, and then running some errands around town.  Tracy [name] reported that a recent struggle was learning that her brother was admitted to the hospital on Saturday, stating That was pretty nerve wracking, but hes doing well now.  Tracy [name] reported that her goal today is to speak with the NP about sleep  issues, stating I'm waking up at 2am each night.        Second Therapeutic Activity: Counselor introduced topic of self-esteem today and defined this as the value an individual places on oneself, based upon assessment of personal worth as a human being and approval/disapproval of one's behavior. Counselor asked members to assess their level of self-esteem at this time based upon common indicators of high self-esteem, including: accepting oneself unconditionally;  having self-respect and deep seated belief that one matters; being unaffected by other people's opinions/criticisms; and showing good control over emotions.  Counselor also explained concept of one's inner critic which serves to highlight faults and minimize strengths, directly influencing low sense of self-esteem.  Counselor then provided handout on 'strengths and qualities', which featured questions to guide discussion and increase awareness of each member's unique individual abilities which could reinforce higher self-esteem. Examples of questions included: 'things I am good at', 'challenges I have overcome', and 'what I like about myself'.  Intervention was effective, as evidenced by Tracy [name] reporting that she has had low self-esteem for much of her life, and this was heavily influenced by her childhood stating I was told by my mother that I wasn't as smart as my older brother and sister.   Tracy [name] reported that it is also hard to trust people, and she possesses several negative aspects of self-esteem, including sense of rejection from others, having a negative self-image, and comparing herself to others.  Tracy [name] reported that she would like to improve overall self-esteem by continuing to engage in therapy and interact with positive, supportive peers, and was able to identify several strengths during activity today, such as possessing empathy, honesty, open mindedness, persistence,  social awareness, bravery, humor, assertiveness, and independence.     Assessment and Plan: Counselor recommends that Tracy [name] remain in IOP treatment to better manage mental health symptoms, ensure stability and pursue completion of treatment plan goals. Counselor recommends adherence to crisis/safety plan, taking medications as prescribed, and following up with medical professionals if any issues arise.   Follow Up Instructions: Counselor will send Webex link for next session. Tracy [name] was advised to call back or seek an in-person evaluation if the symptoms worsen or if the condition fails to improve as anticipated.   I provided 180 minutes of non-face-to-face time during this encounter.   Shade Flood, LCSW, LCAS 09/17/21

## 2021-09-18 ENCOUNTER — Other Ambulatory Visit (HOSPITAL_COMMUNITY): Payer: 59 | Admitting: Licensed Clinical Social Worker

## 2021-09-18 DIAGNOSIS — F339 Major depressive disorder, recurrent, unspecified: Secondary | ICD-10-CM | POA: Diagnosis not present

## 2021-09-18 DIAGNOSIS — R45851 Suicidal ideations: Secondary | ICD-10-CM | POA: Diagnosis not present

## 2021-09-18 NOTE — Progress Notes (Signed)
Virtual Visit via Video Note   I connected with Tracy [name] on 09/18/21 at  9:00 AM EDT by a video enabled telemedicine application and verified that I am speaking with the correct person using two identifiers.   At orientation to the IOP program, Case Manager discussed the limitations of evaluation and management by telemedicine and the availability of in person appointments. The patient expressed understanding and agreed to proceed with virtual visits throughout the duration of the program.   Location:  Patient: Patient Home Provider: [facility] [place] Office   History of Present Illness: MDD, treatment resistant    Observations/Objective: Check In: Case Manager checked in with all participants to review discharge dates, insurance authorizations, work-related documents and needs from the treatment team regarding medications. Tracy [name] stated needs and engaged in discussion.    Initial Therapeutic Activity: Counselor facilitated a check-in with Tracy [name] to assess for safety, sobriety and medication compliance.  Counselor also inquired about Tracy [name]'s current emotional ratings, as well as any significant changes in thoughts, feelings or behavior since previous check in.  Tracy [name] presented for session on time and was alert, oriented x5, with no evidence or self-report of active SI/HI or A/V H.  Tracy [name] reported compliance with medication and denied use of alcohol or illicit substances.  Tracy [name] reported scores of 8/10 for depression, 6/10 for anxiety, and 0/10 for anger/irritability.  Tracy [name] denied any recent outbursts or panic attacks.  Tracy [name] denied any recent successes.  Tracy [name] reported that a recent struggle was not getting anything done yesterday, stating I had low energy and keep waking up in the middle of the night.  Tracy [name] reported that her goal today is to do some reading on a book her therapist recommended her in order to learn more about some coping skills that could help her.       Second Therapeutic  Activity: Counselor introduced Tracy [name], Tracy [name] to provide psychoeducation on topic of Grief and Loss with members today.  Tracy [name] began discussion by checking in with the group about their baseline mood today, general thoughts on what grief means to them and how it has affected them personally in the past.  Tracy [name] provided information on how the process of grief/loss can differ depending upon one's unique culture, and categories of loss one could experience (i.e. loss of a person, animal, relationship, job, identity, etc).  Tracy [name] encouraged members to be mindful of how pervasive loss can be, and how to recognize signs which could indicate that this is having an impact on one's overall mental health and wellbeing.  Intervention was effective, as evidenced by Tracy [name] participating in discussion with speaker on the subject, reporting that she is currently trying to cope with the loss of her marriage since separating from her ex.  Tracy [name] reported that he will still reach out regularly since they own dogs together, and this can be very upsetting, so she needs to set boundaries with him in order to protect her mental health, and spend more time with positive supports, such as her nieces and nephews.    Third Therapeutic Activity:  Counselor introduced topic of 'urge surfing' today.  Counselor explained how this technique can be used to avoid acting upon a behavior that needs to be reduced or stopped completely.  Counselor provided common examples of maladaptive behaviors, such as smoking, overeating, substance use, excessive spending, lashing out emotionally, and more.  Counselor explained how urges rarely last more than 30 minutes if they are not ruminated upon, and attempts  to fight or suppress the urge can ultimately cause the problem to grow.  Counselor guided members through practice of combination mindfulness, relaxation, and visualization exercises in order to surf an urge of their concern until  it faded.  Intervention was effective, as evidenced by Tracy [name] participating in urge surfing activity and reporting that she focused on the urge to shop, and found this approach helpful for distraction, stating I really want to go to the beach now because visualization is my favorite.  The urge did go away after awhile.     Assessment and Plan: Counselor recommends that Tracy [name] remain in IOP treatment to better manage mental health symptoms, ensure stability and pursue completion of treatment plan goals. Counselor recommends adherence to crisis/safety plan, taking medications as prescribed, and following up with medical professionals if any issues arise.   Follow Up Instructions: Counselor will send Webex link for next session. Tracy [name] was advised to call back or seek an in-person evaluation if the symptoms worsen or if the condition fails to improve as anticipated.   I provided 180 minutes of non-face-to-face time during this encounter.   Tracy Flood, LCSW, LCAS 09/18/21

## 2021-09-19 ENCOUNTER — Other Ambulatory Visit (HOSPITAL_COMMUNITY): Payer: 59 | Admitting: Licensed Clinical Social Worker

## 2021-09-19 ENCOUNTER — Other Ambulatory Visit: Payer: Self-pay

## 2021-09-19 DIAGNOSIS — F339 Major depressive disorder, recurrent, unspecified: Secondary | ICD-10-CM | POA: Diagnosis not present

## 2021-09-19 DIAGNOSIS — H524 Presbyopia: Secondary | ICD-10-CM | POA: Diagnosis not present

## 2021-09-19 DIAGNOSIS — R45851 Suicidal ideations: Secondary | ICD-10-CM | POA: Diagnosis not present

## 2021-09-19 DIAGNOSIS — E119 Type 2 diabetes mellitus without complications: Secondary | ICD-10-CM | POA: Diagnosis not present

## 2021-09-19 LAB — HM DIABETES EYE EXAM

## 2021-09-19 NOTE — Progress Notes (Signed)
Virtual Visit via Video Note   I connected with Tracy [name] on 09/19/21 at  9:00 AM EDT by a video enabled telemedicine application and verified that I am speaking with the correct person using two identifiers.   At orientation to the IOP program, Case Manager discussed the limitations of evaluation and management by telemedicine and the availability of in person appointments. The patient expressed understanding and agreed to proceed with virtual visits throughout the duration of the program.   Location:  Patient: Patient Home Provider: [facility] [place] Office   History of Present Illness: MDD, treatment resistant    Observations/Objective: Check In: Case Manager checked in with all participants to review discharge dates, insurance authorizations, work-related documents and needs from the treatment team regarding medications. Tracy [name] stated needs and engaged in discussion.    Initial Therapeutic Activity: Counselor facilitated a check-in with Tracy [name] to assess for safety, sobriety and medication compliance.  Counselor also inquired about Tracy [name]'s current emotional ratings, as well as any significant changes in thoughts, feelings or behavior since previous check in.  Tracy [name] presented for session on time and was alert, oriented x5, with no evidence or self-report of active SI/HI or A/V H.  Tracy [name] reported compliance with medication and denied use of alcohol or illicit substances.  Tracy [name] reported scores of 3/10 for depression, 5/10 for anxiety, and 0/10 for anger/irritability.  Tracy [name] denied any recent outbursts or panic attacks.  Tracy [name] reported that a recent struggle was dealing with anger last night when she found herself ruminating upon the past again. Tracy [name] reported that her goal is to stay busy today by attending a doctor's appointment, and then get some groceries afterward, using deep breathing to stay calm.       Second Therapeutic Activity: Counselor introduced Tracy [name], Tracy [name], to provide  psychoeducation on topic of nutrition with members today.  Tracy [name] virtually shared a comprehensive PowerPoint presentation to guide discussion, which featured the various components of healthy living that influence one's wellbeing, including practicing mindfulness, staying physically active, calm in mood, well-rested, and more.  Tracy [name] explained how good nutrition reinforces positive physical and mental health, and shared a video which explained how food intake affects brain functioning in particular.  Tracy [name] provided advice on how to adjust diet in order to promote wellbeing during course of treatment, including achieving balanced daily intake along with regular exercise.  Tracy [name] offered the 'plate method' as a tool for proper distribution of protein, grains, starches, vegetables, fruit, and low calorie drink, in addition to concept of 'mindful eating', and covering current Dietary Guidelines for Americans for more tips.  Tracy [name] inquired about changes members would like to make to their nutrition in order to increase overall wellbeing based upon information shared today, and time was allowed to ask any questions they might have of Tracy [name] regarding her specialty.  Intervention effectiveness could not be measured, as Tracy [name] had to leave group at 10:30am in order to attend a medical appointment on time.    Assessment and Plan: Counselor recommends that Tracy [name] remain in IOP treatment to better manage mental health symptoms, ensure stability and pursue completion of treatment plan goals. Counselor recommends adherence to crisis/safety plan, taking medications as prescribed, and following up with medical professionals if any issues arise.   Follow Up Instructions: Counselor will send Webex link for next session. Tracy [name] was advised to call back or seek an in-person evaluation if the symptoms worsen or if the condition fails to improve as anticipated.   I provided 90  minutes of non-face-to-face time during this encounter.    Shade Flood, LCSW, LCAS 09/19/21

## 2021-09-20 ENCOUNTER — Other Ambulatory Visit (HOSPITAL_COMMUNITY): Payer: 59 | Admitting: Licensed Clinical Social Worker

## 2021-09-20 ENCOUNTER — Other Ambulatory Visit: Payer: Self-pay

## 2021-09-20 DIAGNOSIS — F339 Major depressive disorder, recurrent, unspecified: Secondary | ICD-10-CM

## 2021-09-20 DIAGNOSIS — R45851 Suicidal ideations: Secondary | ICD-10-CM | POA: Diagnosis not present

## 2021-09-20 NOTE — Progress Notes (Signed)
Virtual Visit via Video Note   I connected with Tracy [name] on 09/20/21 at  9:00 AM EDT by a video enabled telemedicine application and verified that I am speaking with the correct person using two identifiers.   At orientation to the IOP program, Case Manager discussed the limitations of evaluation and management by telemedicine and the availability of in person appointments. The patient expressed understanding and agreed to proceed with virtual visits throughout the duration of the program.   Location:  Patient: Patient Home Provider: [facility] [place] Office   History of Present Illness: MDD, treatment resistant    Observations/Objective: Check In: Case Manager checked in with all participants to review discharge dates, insurance authorizations, work-related documents and needs from the treatment team regarding medications. Tracy [name] stated needs and engaged in discussion.    Initial Therapeutic Activity: Counselor facilitated a check-in with Tracy [name] to assess for safety, sobriety and medication compliance.  Counselor also inquired about Tracy [name]'s current emotional ratings, as well as any significant changes in thoughts, feelings or behavior since previous check in.  Tracy [name] presented for session on time and was alert, oriented x5, with no evidence or self-report of active SI/HI or A/V H.  Tracy [name] reported compliance with medication and denied use of alcohol or illicit substances.  Tracy [name] reported scores of 2/10 for depression, 5/10 for anxiety, and 0/10 for anger/irritability.  Tracy [name] denied any recent outbursts or panic attacks.  Tracy [name] reported that a recent success was attending an eye exam yesterday.  Tracy [name] reported that a struggle was experiencing passive SI without intent or plan yesterday, but using positive self-talk to challenge these thoughts until they passed.  Tracy [name] reported that she will continue to follow safety plan.  Tracy [name] reported that her goal is to begin planning to return to work, stating I  think I'm going to try to do part-time since that will be easier on me.       Second Therapeutic Activity: Counselor introduced topic of assertive communication today.  Counselor shared various handouts with members virtually in group to read along with on the subject.  These handouts defined assertive communication as a communication style in which a person stands up for their own needs and wants, while also taking into consideration the needs and wants of others, without behaving in a passive or aggressive way.  Traits of assertive communicators were highlighted such as using appropriate speaking volume, maintaining eye contact, using confident language, and avoiding interruption.  Members were also provided with tips on how to improve communication, including respecting oneself, expressing thoughts and feelings calmly, and saying No when necessary.  Members were given a variety of scenarios where they could practice using these tips to respond in an assertive manner.  Intervention was effective, as evidenced by Tracy [name] participating in discussion on topic, reporting that she has a very passive communication style due to traits such as allowing others to take advantage of her kindness, prioritizing their needs over her own, not expressing her own needs or feelings clearly, not asking for help, and lacking confidence.  Tracy [name] reported that this was reinforced from an early age due to her religious upbringing, but she displayed more effective use of assertive communication skills through engagement in roleplay activities today.    Assessment and Plan: Counselor recommends that Tracy [name] remain in IOP treatment to better manage mental health symptoms, ensure stability and pursue completion of treatment plan goals. Counselor recommends adherence to crisis/safety plan, taking medications as prescribed, and following up with medical professionals  if any issues arise.   Follow Up Instructions: Counselor will send  Webex link for next session. Tracy [name] was advised to call back or seek an in-person evaluation if the symptoms worsen or if the condition fails to improve as anticipated.   Collaboration of Care:   Medication Management AEB Ricky Ala, NP                                           Case Manager AEB Dellia Nims, CNA   Patient/Guardian was advised Release of Information must be obtained prior to any record release in order to collaborate their care with an outside provider. Patient/Guardian was advised if they have not already done so to contact the registration department to sign all necessary forms in order for [place] to release information regarding their care.   Consent: Patient/Guardian gives verbal consent for treatment and assignment of benefits for services provided during this visit. Patient/Guardian expressed understanding and agreed to proceed.  I provided 180 minutes of non-face-to-face time during this encounter.   Shade Flood, LCSW, LCAS 09/20/21

## 2021-09-21 ENCOUNTER — Other Ambulatory Visit (HOSPITAL_COMMUNITY): Payer: 59 | Admitting: Licensed Clinical Social Worker

## 2021-09-21 DIAGNOSIS — F339 Major depressive disorder, recurrent, unspecified: Secondary | ICD-10-CM

## 2021-09-21 DIAGNOSIS — R45851 Suicidal ideations: Secondary | ICD-10-CM | POA: Diagnosis not present

## 2021-09-21 NOTE — Progress Notes (Signed)
Virtual Visit via Video Note   I connected with [name] on 09/21/21 at  9:00 AM EDT by a video enabled telemedicine application and verified that I am speaking with the correct person using two identifiers.   At orientation to the IOP program, Case Manager discussed the limitations of evaluation and management by telemedicine and the availability of in person appointments. The patient expressed understanding and agreed to proceed with virtual visits throughout the duration of the program.   Location:  Patient: Patient Home Provider: [facility] [place] [facility]   History of Present Illness: MDD, treatment resistant    Observations/Objective: Check In: Case Manager checked in with all participants to review discharge dates, insurance authorizations, work-related documents and needs from the treatment team regarding medications. Tracy [name] stated needs and engaged in discussion.    Initial Therapeutic Activity: Counselor facilitated a check-in with Tracy [name] to assess for safety, sobriety and medication compliance.  Counselor also inquired about Tracy [name]'s current emotional ratings, as well as any significant changes in thoughts, feelings or behavior since previous check in.  Tracy [name] presented for session on time and was alert, oriented x5, with no evidence or self-report of active SI/HI or A/V H.  Tracy [name] reported compliance with medication and denied use of alcohol or illicit substances.  Tracy [name] reported scores of 5/10 for depression, 8/10 for anxiety, and 0/10 for anger/irritability.  Tracy [name] denied any recent outbursts.  Tracy [name] reported that a recent struggle was experiencing a panic attack this morning, stating I had this thought that I that I was going to die, so I sat down and practiced my deep breathing.  Tracy [name] reported that her plan this weekend is to spend time with a friend doing some arts and crafts.        Second Therapeutic Activity: Counselor introduced topic of anger management today.  Counselor virtually  shared a handout with members on this subject featuring a variety of coping skills, and facilitated discussion on these approaches.  Examples included raising awareness of anger triggers, practicing deep breathing, keeping an anger log to better understand episodes, using diversion activities to distract oneself for 30 minutes, taking a time out when necessary, and being mindful of warning signs tied to thoughts or behavior.  Counselor inquired about which techniques group members have used before, what has proved to be helpful, what their unique warning signs might be, as well as what they will try out in the future to assist with de-escalation.  Intervention was effective, as evidenced by Tracy [name] participating in discussion on topic, reporting that she has been having problems with anger management over the past year, which have been influenced by relationship and work stress.  Tracy [name] reported that her triggers include loud noises, and people yelling at her, insulting her, or judging her unfairly.  Tracy [name] stated Resentment is a big one, too.  Also annoyances and frustration.  Tracy [name] reported that she has attempted to cope with work place stress by drinking alcohol before, but never to excess, and has been careful to watch this habit to avoid using it as an escape.  She reported that she is motivated to practice healthier anger management approaches, including keeping an anger journal to process her difficult feelings, sharing feelings with supportive peers, using a guided meditation application on her phone, and setting boundaries with triggering people and settings.  Tracy [name] reported that discussing this subject today was Very helpful.    Assessment and Plan: Counselor recommends that Tracy [name] remain in IOP treatment to better manage mental  health symptoms, ensure stability and pursue completion of treatment plan goals. Counselor recommends adherence to crisis/safety plan, taking medications as prescribed, and  following up with medical professionals if any issues arise.   Follow Up Instructions: Counselor will send Webex link for next session. Michaelyn was advised to call back or seek an in-person evaluation if the symptoms worsen or if the condition fails to improve as anticipated.   Collaboration of Care:   Medication Management AEB Tracy Ala, NP                                           Case Manager AEB Tracy Nims, CNA   Patient/Guardian was advised Release of Information must be obtained prior to any record release in order to collaborate their care with an outside provider. Patient/Guardian was advised if they have not already done so to contact the registration department to sign all necessary forms in order for Tracy [name] to release information regarding their care.   Consent: Patient/Guardian gives verbal consent for treatment and assignment of benefits for services provided during this visit. Patient/Guardian expressed understanding and agreed to proceed.  I provided 180 minutes of non-face-to-face time during this encounter.   Tracy Flood, LCSW, LCAS 09/21/21

## 2021-09-24 ENCOUNTER — Other Ambulatory Visit (HOSPITAL_COMMUNITY): Payer: 59 | Admitting: Licensed Clinical Social Worker

## 2021-09-24 ENCOUNTER — Other Ambulatory Visit: Payer: Self-pay

## 2021-09-24 DIAGNOSIS — F339 Major depressive disorder, recurrent, unspecified: Secondary | ICD-10-CM | POA: Diagnosis not present

## 2021-09-24 DIAGNOSIS — R45851 Suicidal ideations: Secondary | ICD-10-CM | POA: Diagnosis not present

## 2021-09-24 NOTE — Progress Notes (Signed)
Virtual Visit via Video Note   I connected with Tracy [name] on 09/24/21 at  9:00 AM EDT by a video enabled telemedicine application and verified that I am speaking with the correct person using two identifiers.   At orientation to the IOP program, Case Manager discussed the limitations of evaluation and management by telemedicine and the availability of in person appointments. The patient expressed understanding and agreed to proceed with virtual visits throughout the duration of the program.   Location:  Patient: Patient Home Provider: [facility] [place] [facility]   History of Present Illness: MDD, treatment resistant    Observations/Objective: Check In: Case Manager checked in with all participants to review discharge dates, insurance authorizations, work-related documents and needs from the treatment team regarding medications. Tracy [name] stated needs and engaged in discussion.    Initial Therapeutic Activity: Counselor facilitated a check-in with Tracy [name] to assess for safety, sobriety and medication compliance.  Counselor also inquired about Tracy [name]'s current emotional ratings, as well as any significant changes in thoughts, feelings or behavior since previous check in.  Tracy [name] presented for session on time and was alert, oriented x5, with no evidence or self-report of active SI/HI or A/V H.  Tracy [name] reported compliance with medication and denied use of alcohol or illicit substances.  Tracy [name] reported scores of 5/10 for depression, 8/10 for anxiety, and 0/10 for anger/irritability.  Tracy [name] denied any recent outbursts.  Tracy [name] reported that a recent success was receiving a picture of her pregnant niece, which lifted her spirits a bit.  Tracy [name] reported that a recent struggle was experiencing a panic attack yesterday, but countering this with deep breathing an positive self-talk.  Tracy [name] disclosed that she did experience passive SI without intent or plan yesterday, but remains agreeable to following safety plan.      Second  Therapeutic Activity: Counselor provided psychoeducation on subject of boundaries with group members today using a virtual handout.  This handout defined boundaries as the limits and rules that we set for ourselves within relationships, and featured a breakdown of the 3 common categories of boundaries (i.e. porous, rigid, and healthy), along with typical traits specific to each one for easy identification.  It was noted that most people have a mixture of different boundary types depending on setting, person, and culture.  Additional information was provided on the types of boundaries (i.e. physical, intellectual, emotional, sexual, material, and time) within relationships, and what could be considered healthy versus unhealthy. Counselor tasked members with identifying what types of boundaries they presently hold within her own support systems, the collective impact these boundaries have upon their mental health, and changes that could be made in order to more effectively communicate individual mental health needs.  Intervention was effective, as evidenced by Tracy [name] participating in discussion on topic, reporting that she had a mixture of porous and rigid boundaries based upon person and setting.  Tracy [name] reported that in her marriage, she maintained porous boundaries for a long time , and would routinely have trouble saying No to her husband's requests, accepting of disrespect, and feared rejection if she didn't comply.  Tracy [name] reported that since ending the marriage she has had more rigid boundaries to protect herself, and has been less likely to ask for help, avoidant of intimacy, held few close relationships, is protective of personal information, and keeps others at a distance.  Tracy [name] reported that this has been isolating, but she has used group therapy to safely start opening up more without fear of judgement and make new  connections, so she plans to connect with Tracy [name] to engage in free weekly groups  and continue meeting with likeminded individuals upon discharge from [name] tomorrow.      Assessment and Plan: Counselor recommends that Tracy [name] remain in IOP treatment to better manage mental health symptoms, ensure stability and pursue completion of treatment plan goals. Counselor recommends adherence to crisis/safety plan, taking medications as prescribed, and following up with medical professionals if any issues arise.   Follow Up Instructions: Counselor will send Webex link for next session. Money was advised to call back or seek an in-person evaluation if the symptoms worsen or if the condition fails to improve as anticipated.   Collaboration of Care:   Medication Management AEB Tracy Ala, NP                                           Case Manager AEB Dellia Nims, CNA   Patient/Guardian was advised Release of Information must be obtained prior to any record release in order to collaborate their care with an outside provider. Patient/Guardian was advised if they have not already done so to contact the registration department to sign all necessary forms in order for [place] to release information regarding their care.   Consent: Patient/Guardian gives verbal consent for treatment and assignment of benefits for services provided during this visit. Patient/Guardian expressed understanding and agreed to proceed.  I provided 180 minutes of non-face-to-face time during this encounter.   Shade Flood, LCSW, LCAS 09/24/21

## 2021-09-25 ENCOUNTER — Other Ambulatory Visit (HOSPITAL_COMMUNITY): Payer: 59 | Admitting: Psychiatry

## 2021-09-25 ENCOUNTER — Encounter (HOSPITAL_COMMUNITY): Payer: Self-pay | Admitting: Family

## 2021-09-25 DIAGNOSIS — F339 Major depressive disorder, recurrent, unspecified: Secondary | ICD-10-CM

## 2021-09-25 DIAGNOSIS — R45851 Suicidal ideations: Secondary | ICD-10-CM | POA: Diagnosis not present

## 2021-09-25 NOTE — Patient Instructions (Signed)
D:  Patient completed MH-IOP today.  A:  Discharge today.  Follow up with [employer], APRN on 09-27-21 and Deloria Lair, NP on 10-01-21 @ 11:20 a.m.Marland Kitchen  Strongly encouraged support groups through [facility] and [facility] Chimera [phone] for grief counseling.  Patient will return to work as per [employer].  R: Patient receptive.

## 2021-09-25 NOTE — Progress Notes (Signed)
Virtual Visit via Video Note   I connected with [name] on 09/25/21 at  9:00 AM EDT by a video enabled telemedicine application and verified that I am speaking with the correct person using two identifiers.   At orientation to the IOP program, Case Manager discussed the limitations of evaluation and management by telemedicine and the availability of in person appointments. The patient expressed understanding and agreed to proceed with virtual visits throughout the duration of the program.   Location:  Patient: Patient Home Provider: [facility] [place] [facility]   History of Present Illness: MDD, treatment resistant    Observations/Objective: Check In: Case Manager checked in with all participants to review discharge dates, insurance authorizations, work-related documents and needs from the treatment team regarding medications. Farhiya stated needs and engaged in discussion.    Initial Therapeutic Activity: Counselor facilitated a check-in with Albirta to assess for safety, sobriety and medication compliance.  Counselor also inquired about Adyn's current emotional ratings, as well as any significant changes in thoughts, feelings or behavior since previous check in.  Layana presented for session on time and was alert, oriented x5, with no evidence or self-report of active SI/HI or A/V H.  Aylin reported compliance with medication and denied use of alcohol or illicit substances.  Elowen reported scores of 8/10 for depression, 8/10 for anxiety, and 4/10 for anger/irritability.  Samyria denied any recent outbursts.  Marielys reported that a recent success was completing MHIOP today.  Michell reported that a recent struggle was experiencing 2 panic attacks this morning, stating I was just minding my own business, wiping off the counter tops when they hit me.  Javona reported that she experienced passive SI without intent or plan earlier this morning as well, but used deep breathing to calm down, and agreed to continue  following safety plan in order to avoid self-harm should these thoughts return.      Second Therapeutic Activity: Counselor introduced [employer], [facility] to provide psychoeducation on topic of Grief and Loss with members today.  [name] began discussion by checking in with the group about their baseline mood today, general thoughts on what grief means to them and how it has affected them personally in the past.  [name] provided information on how the process of grief/loss can differ depending upon one's unique culture, and categories of loss one could experience (i.e. loss of a person, animal, relationship, job, identity, etc).  [name] encouraged members to be mindful of how pervasive loss can be, and how to recognize signs which could indicate that this is having an impact on one's overall mental health and wellbeing.  Intervention was effective, as evidenced by [name] participating in discussion with speaker on the subject, reporting that she has experienced loss of family and friend over the years, and felt judged by a peer regarding how she grieved differently during these times.  Hanne was receptive to feedback offered from speaker normalizing the experience of grieving differently from loss to loss.  Lamyra also reported motivation to engage in grief counseling following discharge from [place].     Assessment and Plan: Clare has completed MHIOP as of today and agreed to follow up with appointments for therapist and psychiatrist, as well as link with [facility] for ongoing group support.  Counselor recommends adherence to crisis/safety plan, taking medications as prescribed, and following up with medical professionals if any issues arise.   Follow Up Instructions: Kimiyo was advised to call back or seek an in-person evaluation if  the symptoms worsen or if the condition fails to improve as anticipated.   Collaboration of Care:   Medication Management AEB Ricky Ala, NP                                            Case Manager AEB Dellia Nims, CNA    Patient/Guardian was advised Release of Information must be obtained prior to any record release in order to collaborate their care with an outside provider. Patient/Guardian was advised if they have not already done so to contact the registration department to sign all necessary forms in order for [place] to release information regarding their care.   Consent: Patient/Guardian gives verbal consent for treatment and assignment of benefits for services provided during this visit. Patient/Guardian expressed understanding and agreed to proceed.  I provided 180 minutes of non-face-to-face time during this encounter.   Shade Flood, LCSW, LCAS 09/25/21

## 2021-09-25 NOTE — Progress Notes (Signed)
Virtual Visit via Video Note  I connected with Tracy [name] on 09/25/21 at  9:00 AM EST by a video enabled telemedicine application and verified that I am speaking with the correct person using two identifiers.  Location: Patient: Home Provider: Office   I discussed the limitations of evaluation and management by telemedicine and the availability of in person appointments. The patient expressed understanding and agreed to proceed.     I discussed the assessment and treatment plan with the patient. The patient was provided an opportunity to ask questions and all were answered. The patient agreed with the plan and demonstrated an understanding of the instructions.   The patient was advised to call back or seek an in-person evaluation if the symptoms worsen or if the condition fails to improve as anticipated.  I provided 15 minutes of non-face-to-face time during this encounter.   Derrill Center, NP   [facility] Discharge Summary  Tracy [name] [id number]  Admission date: 09/06/2021 Discharge date: 09/25/2021  Reason for admission: Per admission note: Tracy [name] is 56 year female that presents with passive suicidal ideations.  States "I was just trying to sleep and was not actually trying to harm her self." Reports taking a handful of Klonopin.  She reports multiple stressors related to the passing of her mother and grandmother.  Reports more recently she has been feeling overwhelmed due to strenuous work high demand.  States she is currently followed by therapy and psychiatry at [facility] psychiatric.    Progress in Program Toward Treatment Goals: Progressing; Tracy [name] attended and participated with daily group session with active and engaged participation.  Denying suicidal or homicidal ideations.  Denies auditory or visual hallucinations.  Reports group was helpful. "  I met really nice people."  Denied depression or depressive  symptoms.  Reports she has a follow-up appointment with her therapist and psychiatrist early next week.  Declined medication refills at this time.  Support, encouragement and reassurance was provided.  Progress (rationale): Keep follow-up with Tracy [name] and Tracy [name]. for medication management and Therapy services  Take all medications as prescribed. Keep all follow-up appointments as scheduled.  Do not consume alcohol or use illegal drugs while on prescription medications. Report any adverse effects from your medications to your primary care provider promptly.  In the event of recurrent symptoms or worsening symptoms, call 911, a crisis hotline, or go to the nearest emergency department for evaluation.    Derrill Center, NP 09/25/2021

## 2021-09-25 NOTE — Progress Notes (Signed)
Virtual Visit via Video Note  I connected with Tracy [name] on @TODAY @ at  9:00 AM EST by a video enabled telemedicine application and verified that I am speaking with the correct person using two identifiers.  Location: Patient: at home Provider: at the office   I discussed the limitations of evaluation and management by telemedicine and the availability of in person appointments. The patient expressed understanding and agreed to proceed.  I discussed the assessment and treatment plan with the patient. The patient was provided an opportunity to ask questions and all were answered. The patient agreed with the plan and demonstrated an understanding of the instructions.   The patient was advised to call back or seek an in-person evaluation if the symptoms worsen or if the condition fails to improve as anticipated.  I provided 20 minutes of non-face-to-face time during this encounter.   Dellia Nims, M.Ed,CNA   Patient ID: Tracy [name], female   DOB: [birth year], 56 y.o.   MRN: [id number] D:  As per previous CCA states: this is a 56 yr old, divorced, Caucasian, employed female who was referred per [facility], [facility]; treatment for worsening depressive and anxiety symptoms/PTSD.  Pt admits to prior SI last week; but currently denies SI.  Discussed saftey options at length with pt.  Pt denies SI/HI or A/V hallucinations.  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  C/O being micromanaged.  "It's too much work.  I can't complete my shifts or I'm having to miss days b/c of the stress."  Pt works on the weekend.  2) Grief/Loss Issues:  Mother died in November 13, 2018; then two months later a brother and in 2020-09-14 best friend died suddenly of a massive heartattack.  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  Pt reports multiple prior psych hospitalizations:  2001-11-12 [facility]; [facility], [facility], 2-3 X at [facility]  (most recently 2-3 yrs ago.  All d/t depression/anxiety/SI/Attempts.  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  Family hx:  Deceased father:  Bipolar; Deceased mother:  Depression; Sister:  Anxiety.   Current Symptoms/Problems: Sadness, poor concentration, tearfulness, anxiety, panic attacks (once-twice a week), poor appetite (sick on stomach d/t anxiety), irritable, hopelessness, ruminating thoughts, SI (no plan/intent), isolative, decreased ADL's (took shower only three times last night), poor energy, anhedonia, poor sleep (difficult to get to sleep d/t ruminating thoughts); also has awakenings   Pt completed MH-IOP today.  States she hasn't accomplished any goals.  Although pt states she's not doing well and keeps repeating that she is "sick,"  pt's mood/affect is incongruent with what she is saying.  Pt's overall mood has improved and affect is brighter since starting MH-IOP.  Pt c/o panic attacks.  States she had two lastnight and one this morning.  "I gag every morning and vomit."  On a scale of 1-10 (10 being the worst); pt rates her depression at a 7 and anxiety at a 9.  Reports passive SI (no plan or intent); denies HI or A/V hallucinations. A:  D/C pt today.  Strongly recommended support groups through [employer] and [employer].  Pt could even possibly benefit from DBT.  Pt to return to work as per Deloria Lair, NP directions.  Pt f/u with Deloria Lair, NP on 10-01-21 @ 11:20 a.m., and [employer], APRN  on 09-27-21.  R:  Pt receptive.  Dellia Nims, M.Ed,CNA

## 2021-09-26 ENCOUNTER — Encounter: Payer: Self-pay | Admitting: Family Medicine

## 2021-09-26 ENCOUNTER — Ambulatory Visit (HOSPITAL_COMMUNITY): Payer: 59

## 2021-09-27 DIAGNOSIS — F3181 Bipolar II disorder: Secondary | ICD-10-CM | POA: Diagnosis not present

## 2021-10-01 ENCOUNTER — Other Ambulatory Visit: Payer: Self-pay | Admitting: Family Medicine

## 2021-10-01 ENCOUNTER — Encounter: Payer: Self-pay | Admitting: Adult Health

## 2021-10-01 ENCOUNTER — Ambulatory Visit (INDEPENDENT_AMBULATORY_CARE_PROVIDER_SITE_OTHER): Payer: 59 | Admitting: Adult Health

## 2021-10-01 ENCOUNTER — Other Ambulatory Visit (HOSPITAL_COMMUNITY): Payer: Self-pay

## 2021-10-01 ENCOUNTER — Other Ambulatory Visit: Payer: Self-pay

## 2021-10-01 DIAGNOSIS — F410 Panic disorder [episodic paroxysmal anxiety] without agoraphobia: Secondary | ICD-10-CM

## 2021-10-01 DIAGNOSIS — F411 Generalized anxiety disorder: Secondary | ICD-10-CM

## 2021-10-01 DIAGNOSIS — F4310 Post-traumatic stress disorder, unspecified: Secondary | ICD-10-CM | POA: Diagnosis not present

## 2021-10-01 MED ORDER — SERTRALINE HCL 100 MG PO TABS
ORAL_TABLET | Freq: Every morning | ORAL | 3 refills | Status: DC
  Filled 2021-10-01: qty 180, fill #0
  Filled 2021-10-05 – 2021-10-31 (×2): qty 180, 90d supply, fill #0
  Filled 2022-02-17: qty 180, 90d supply, fill #1
  Filled 2022-05-06: qty 180, 90d supply, fill #2

## 2021-10-01 MED ORDER — CLONAZEPAM 1 MG PO TABS
1.0000 mg | ORAL_TABLET | Freq: Three times a day (TID) | ORAL | 2 refills | Status: DC
  Filled 2021-10-01: qty 90, 30d supply, fill #0
  Filled 2021-10-31: qty 90, 30d supply, fill #1
  Filled 2021-11-12 – 2021-11-27 (×2): qty 90, 30d supply, fill #2

## 2021-10-01 NOTE — Progress Notes (Signed)
Tracy [name] [id number] [birth year] 56 y.o.  Subjective:   Patient ID:  Tracy [name] is a 56 y.o. (DOB [birth year]) female.  Chief Complaint: No chief complaint on file.   HPI SERENITEE FUERTES presents to the office today for follow-up of MDD, GAD, PTSD, and Panic attacks.  Describes mood today as "not good". Pleasant. Flat. Tearful throughout interview. Mood symptoms - reports depression, irritability and anxiety. Reports worry and rumination. Reports daily panic attacks. Stating "I'm not any better than I was". Has completed IOP program at [facility] and does not feel like it was helpful for her. Stating "It reminded me of things I learned before before and it was helpful to be in a group". Has not been working with DBT work book - not getting out of if what I need. Stating "I want to get better, but I can't. Also stating "I'm willing to do what it takes".Plans to meet with therapist on Tuesday to discuss DBT options. Decreased interest and motivation. Taking medications as prescribed, but does not feel like they are helping. Does not feel ready to return to work setting. Stating "I can't do it right now".  Energy levels poor. Active, does not have a regular exercise routine.  Enjoys some usual interests and activities. Lives alone with 3 dogs. Recently divorced. Siblings local. Spending time with family. Appetite adequate. Weight stable - 300 pounds.  Sleeps well most nights. Averages 11 hours. Sleep apnea - taking Nuvigil in the morning.  Denies SI or HI. Reports passive SI. Denies AH or VH.   Previous medication trials:  Antipsychotics: Vraylar-TD, Latuda, Abilify, Geodon, Rexulti, Lybalvi, Risperdal  Mood Stabilizers - Depakote, Lithium, Lamictal, Equetro, Topamax, Trileptal, Gabapentin  SSRI - Zoloft, Lexapro, Celexa, Prozac, Viibryd, Trintellix, Prozac  SNRI - Pristiq, Effexor, Cymbalta  Wellbutrin - allergic reaction  Anti-anxiety - Buspar, Xanax, Clonazepam,  Ativan, Valium  Sleep agents - Trazadone, Ambien, Restoril   Other - Deplin, Emsam, Serzone, Doxepin, Nuvigil, Vistaril  Previous treatment: ECT and [place]    AIMS    Flowsheet Row Admission (Discharged) from 06/05/2015 in [place] Total Score 0      AUDIT    Flowsheet Row Admission (Discharged) from 06/05/2015 in [place]  Alcohol Use Disorder Identification Test Final Score (AUDIT) 0      GAD-7    Flowsheet Row Office Visit from 03/14/2021 in [name] Office Visit from 02/14/2021 in [place] Office Visit from 03/01/2020 in [place]  Total GAD-7 Score 17 12 11       PHQ2-9    Flowsheet Row Counselor from 09/04/2021 in [place] Office Visit from 03/14/2021 in [place] Office Visit from 02/14/2021 in [place] Office Visit from 10/11/2020 in [place] Office Visit from 03/01/2020 in [place]  PHQ-2 Total Score 6 6 6 6 3   PHQ-9 Total Score 22 19 21 19 9       Flowsheet Row Counselor from 09/04/2021 in [facility] from 02/11/2021 in [facility] from 02/10/2021 in [facility]  C-SSRS RISK CATEGORY Error: Question 6 not populated No Risk No Risk        Review of Systems:  Review of Systems  Musculoskeletal:  Negative for gait  problem.  Neurological:  Negative for tremors.  Psychiatric/Behavioral:         Please refer to HPI   Medications: I have reviewed the patient's current medications.  Current Outpatient Medications  Medication Sig Dispense Refill   Armodafinil 200 MG TABS Take 1 tablet by mouth as needed for sleepiness 30 tablet 2   clonazePAM (KLONOPIN) 1 MG  tablet Take 1 tablet by mouth 3 times daily. 90 tablet 2   Continuous Blood Gluc Sensor (DEXCOM G6 SENSOR) MISC Replace every 10 days.  Use to check blood sugar. 9 each 1   Continuous Blood Gluc Transmit (DEXCOM G6 TRANSMITTER) MISC Use to check continuous blood sugar.  Replace every 90 days 1 each 3   cyclobenzaprine (FLEXERIL) 10 MG tablet TAKE 1 TABLET BY MOUTH 2 TIMES DAILY AS NEEDED FOR MUSCLE SPASMS 20 tablet 0   doxycycline (VIBRA-TABS) 100 MG tablet Take 1 tablet by mouth 2 (two) times daily for 10 days. 20 tablet 0   Esketamine HCl, 56 MG Dose, (SPRAVATO, 56 MG DOSE,) 28 MG/DEVICE SOPK Administer 56 mg ( 2 of the 28 mg) intranasally as directed. (Patient not taking: Reported on 09/14/2021) 2 each 0   Esketamine HCl, 84 MG Dose, (SPRAVATO, 84 MG DOSE,) 28 MG/DEVICE SOPK Administer 84mg  intranasally once a week.  To be administered under the supervision of a healthcare professional. (Patient not taking: Reported on 09/14/2021) 6 each 5   ferrous sulfate 325 (65 FE) MG EC tablet Take 1 tablet (325 mg total) by mouth in the morning and at bedtime. Take iron supplement 2 hours before or 4 hours after PPI or other antacids as this requires some level of gastric acidity to aid in absorption 60 tablet 5   folic acid (FOLVITE) 1 MG tablet Take 1 tablet (1 mg total) by mouth daily. 90 tablet 0   furosemide (LASIX) 40 MG tablet TAKE 1 TABLET BY MOUTH DAILY. 30 tablet 3   glucose blood (FREESTYLE LITE) test strip Use as instructed 100 each 12   hydrOXYzine (ATARAX) 25 MG tablet Take 1 tablet (25 mg total) by mouth 3 (three) times daily as needed for anxiety. 30 tablet 1   insulin degludec (TRESIBA FLEXTOUCH) 100 UNIT/ML FlexTouch Pen Inject 30 Units into the skin 2 times daily. 54 mL 3   insulin lispro (HUMALOG KWIKPEN) 100 UNIT/ML KwikPen Inject up to 16 units 3 times daily at mealtime per sliding scale. (Patient not taking: Reported on 09/14/2021) 15 mL 11   Insulin Pen Needle (UNIFINE PENTIPS) 31G X 5 MM  MISC Use as directed to inject insulin 100 each 3   lamoTRIgine (LAMICTAL) 200 MG tablet Take 2 tablets by mouth at bedtime. 180 tablet 0   lidocaine (XYLOCAINE) 5 % ointment Apply 1 application topically 3 (three) times daily as needed for mouth pain. Or sores (Patient not taking: Reported on 09/14/2021)     lisinopril (ZESTRIL) 10 MG tablet Take 1 tablet (10 mg total) by mouth daily. 90 tablet 3   meloxicam (MOBIC) 15 MG tablet Take one tablet by mouth every morning with a meal for 2 weeks, then daily as needed for pain. 30 tablet 0   NUVIGIL 200 MG TABS Take 1 tablet by mouth daily as needed (sleepiness). 30 tablet 2   nystatin (MYCOSTATIN/NYSTOP) powder Apply 3 times daily as directed. (Patient taking differently: Apply 1 application topically daily.) 60 g 3   ondansetron (ZOFRAN-ODT) 4 MG disintegrating tablet Dissolve 1 tablet (4 mg total) by mouth 2 (  two) times daily as needed for nausea or vomiting. 20 tablet 1   pantoprazole (PROTONIX) 40 MG tablet Take 2 tablets (80 mg total) by mouth 2 (two) times daily. 60 tablet 3   risperiDONE (RISPERDAL) 1 MG tablet Take 1 tablet (1 mg total) by mouth at bedtime. 90 tablet 1   rizatriptan (MAXALT) 10 MG tablet Take 1 tablet (10 mg total) by mouth as needed for migraine. May repeat in 2 hours if needed (Patient taking differently: Take 10 mg by mouth every 2 (two) hours as needed for migraine. May repeat in 2 hours if needed) 10 tablet 0   rosuvastatin (CRESTOR) 20 MG tablet TAKE 1 TABLET BY MOUTH DAILY. 90 tablet 3   Semaglutide (RYBELSUS) 14 MG TABS Take 1 tablet by mouth daily. 90 tablet 1   sertraline (ZOLOFT) 100 MG tablet TAKE 2 TABLETS BY MOUTH EVERY MORNING 180 tablet 3   spironolactone (ALDACTONE) 50 MG tablet Take 1 tablet (50 mg total) by mouth 2 (two) times daily. 60 tablet 2   sucralfate (CARAFATE) 1 g tablet Take 1 g by mouth 4 (four) times daily. (Patient not taking: Reported on 09/14/2021)     SYNTHROID 300 MCG tablet TAKE 1 TABLET BY  MOUTH ONCE A DAY BEFORE BREAKFAST 90 tablet 1   Vitamin D, Ergocalciferol, (DRISDOL) 1.25 MG (50000 UNIT) CAPS capsule TAKE 1 CAPSULE BY MOUTH EVERY 7 DAYS. 12 capsule 1   No current facility-administered medications for this visit.    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   Carpal tunnel syndrome 09/06/2015   Chewing difficulty    Chronic left shoulder pain 05/17/2020   Constipation    Coronary artery disease 03/21/2021   Cystic teratoma    BENIGN   Dental crowns present    Diabetes mellitus without complication ([place])    Diabetes mellitus, type 2 ([place]) 06/10/2013   Diarrhea    Diverticulosis of colon without hemorrhage    DOE (dyspnea on exertion) 03/21/2021   Dyspepsia 12/14/2020   Essential hypertension 10/02/2007   Qualifier: Diagnosis of  By: Loanne Drilling MD, Sean A    Facial rash 06/16/2019   Female hirsutism 02/09/2015   Food allergy    Walnuts   GAD (generalized anxiety disorder)    Gastritis and gastroduodenitis    Gastroesophageal reflux disease    Grade II internal hemorrhoids    Greater trochanteric bursitis of left hip 07/24/2018   Hemorrhoids, internal 07/18/2017   History of kidney stones    History of migraine headaches    History of posttraumatic stress disorder (PTSD)    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/2015   Hypothyroidism    HYPOTHYROIDISM, POSTSURGICAL 10/02/2007   Qualifier: Diagnosis of  By: Loanne Drilling MD, Sean A    IBS (irritable  bowel syndrome)    Infertility associated with anovulation 02/09/2015   Insulin resistance 02/09/2015   Joint pain    Leg swelling  02/05/5008   Lichen planus    Liver cirrhosis secondary to NASH (nonalcoholic steatohepatitis) ([place])    Lower back pain 07/18/2017   MDD (major depressive disorder) 06/06/2015   Migraine 10/02/2007   Morbid obesity with BMI of 40.0-44.9, adult ([place]) 06/23/2020   NASH (nonalcoholic steatohepatitis)    Nausea without vomiting    Nonalcoholic fatty liver disease 02/09/2015   Obstructive sleep apnea 06/27/2014   Oral mucosal lesion 06/17/2019   Osteoarthritis    Other fatigue    PCOS (polycystic ovarian syndrome)    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[place]) 01/01/2012   POLYCYSTIC OVARIAN DISEASE 10/02/2007   Qualifier: Diagnosis of  By: Loanne Drilling MD, Hilliard Clark A    PONV (postoperative nausea and vomiting)    also hx. 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   Please see review of systems for further details on the patient's review from today.   Objective:   Physical Exam:  LMP 08/24/2020   Physical Exam Constitutional:      General: She is not in acute distress. Musculoskeletal:        General: No deformity.  Neurological:     Mental Status: She is alert and oriented to person, place, and  time.     Coordination: Coordination normal.  Psychiatric:        Attention and Perception: Attention and perception normal. She does not perceive auditory or visual hallucinations.        Mood and Affect: Mood normal. Mood is not anxious or depressed. Affect is not labile, blunt, angry or inappropriate.        Speech: Speech normal.        Behavior: Behavior normal.        Thought Content: Thought content normal. Thought content is not paranoid or delusional. Thought content does not include homicidal or suicidal ideation. Thought content does not include homicidal or suicidal plan.        Cognition and Memory: Cognition and memory normal.        Judgment: Judgment normal.     Comments: Insight intact    Lab Review:     Component Value Date/Time   NA 137 08/02/2021 0000   NA 140 10/11/2020 1041   K 4.4 08/02/2021 0000   CL 104 08/02/2021 0000   CO2 24 08/02/2021 0000   GLUCOSE 153 (H) 08/02/2021 0000   BUN 9 08/02/2021 0000   BUN 14 10/11/2020 1041   CREATININE 0.95 08/02/2021 0000   CALCIUM 9.4 08/02/2021 0000   PROT 7.2 08/02/2021 0000   PROT 7.1 10/11/2020 1041   ALBUMIN 3.8 02/11/2021 1516   ALBUMIN 4.2 10/11/2020 1041   AST 26 08/02/2021 0000   ALT 26 08/02/2021 0000   ALKPHOS 172 (H) 02/11/2021 1516  BILITOT 0.3 08/02/2021 0000   BILITOT <0.2 10/11/2020 1041   GFRNONAA >60 02/11/2021 1516   GFRNONAA 99 12/26/2020 0000   GFRAA 115 12/26/2020 0000       Component Value Date/Time   WBC 4.4 02/14/2021 0000   RBC 4.67 02/14/2021 0000   HGB 11.9 02/14/2021 0000   HGB 10.9 (L) 10/11/2020 1041   HCT 38.4 02/14/2021 0000   HCT 35.6 10/11/2020 1041   PLT 171 02/14/2021 0000   PLT 224 10/11/2020 1041   MCV 82.2 02/14/2021 0000   MCV 78 (L) 10/11/2020 1041   MCH 25.5 (L) 02/14/2021 0000   MCHC 31.0 (L) 02/14/2021 0000   RDW 20.0 (H) 02/14/2021 0000   RDW 17.5 (H) 10/11/2020 1041   LYMPHSABS 1,448 02/14/2021 0000   LYMPHSABS 1.7 10/11/2020 1041   MONOABS 0.3  02/11/2021 1516   EOSABS 141 02/14/2021 0000   EOSABS 0.2 10/11/2020 1041   BASOSABS 22 02/14/2021 0000   BASOSABS 0.0 10/11/2020 1041    No results found for: POCLITH, LITHIUM   No results found for: PHENYTOIN, PHENOBARB, VALPROATE, CBMZ   .res Assessment: Plan:    Plan:  PDMP reviewed  Risperdal 1mg  at hs  Lamictal 200mg  daily Zoloft 100mg  - 2 daily Nuvigil 200mg  daily - take every day Clonazepam 1mg  Xanax XR 0.5mg   Hydroxyzine 25mg  TID - using 1 tablet at bedtime.   Will plan to explore DBT programs for patient. Called one while in office.  Patient still unable to return to work. Continue LOA 10/01/2021 through 10/29/2020.  Time spent with patient was 40 minutes. Greater than 50% of face to face time with patient was spent on counseling and coordination of care.      [employer] - therapist  RTC 4 weeks  Has tried multiple medications without any long term relief of symptoms. Has also participated in [place] x 2 and ECT. Felt like [place] was helpful initially and tried it a second time without further success. Does not feel current medications are offering symptom relief. Cuurently awaiting [place] treatment.  Patient advised to contact office with any questions, adverse effects, or acute worsening in signs and symptoms.  Counseled patient regarding potential benefits, risks, and side effects of Lamictal to include potential risk of Stevens-Johnson syndrome. Advised patient to stop taking Lamictal and contact office immediately if rash develops and to seek urgent medical attention if rash is severe and/or spreading quickly.   Discussed potential benefits, risk, and side effects of benzodiazepines to include potential risk of tolerance and dependence, as well as possible drowsiness.  Advised patient not to drive if experiencing drowsiness and to take lowest possible effective dose to minimize risk of dependence and tolerance.  Discussed potential metabolic side effects  associated with atypical antipsychotics, as well as potential risk for movement side effects. Advised pt to contact office if movement side effects occur.    Diagnoses and all orders for this visit:  GAD (generalized anxiety disorder) -     sertraline (ZOLOFT) 100 MG tablet; TAKE 2 TABLETS BY MOUTH EVERY MORNING  Panic attacks -     sertraline (ZOLOFT) 100 MG tablet; TAKE 2 TABLETS BY MOUTH EVERY MORNING -     clonazePAM (KLONOPIN) 1 MG tablet; Take 1 tablet by mouth 3 times daily.  PTSD (post-traumatic stress disorder) -     sertraline (ZOLOFT) 100 MG tablet; TAKE 2 TABLETS BY MOUTH EVERY MORNING     Please see After Visit Summary for patient specific instructions.  Future  Appointments  Date Time Provider [facility]  10/29/2021 10:40 AM Yutaka Holberg, Berdie Ogren, NP CP-CP None  11/15/2021  4:20 PM Revankar, Reita Cliche, MD CVD-H[facility] None  11/29/2021 11:10 AM Luetta Nutting, DO PCK-PCK None    No orders of the defined types were placed in this encounter.   -------------------------------

## 2021-10-02 ENCOUNTER — Telehealth: Payer: Self-pay | Admitting: Adult Health

## 2021-10-02 ENCOUNTER — Other Ambulatory Visit (HOSPITAL_COMMUNITY): Payer: Self-pay

## 2021-10-02 ENCOUNTER — Telehealth: Payer: Self-pay | Admitting: Psychiatry

## 2021-10-02 NOTE — Telephone Encounter (Signed)
Received fax from [employer] re: Tracy [name]. They need completion of FMLA form. Placed in [name]'s box.

## 2021-10-02 NOTE — Telephone Encounter (Signed)
Received fax from [employer] [facility] re: Tracy [name]. They need completion of FMLA form. Placed in [name]'s box.

## 2021-10-02 NOTE — Telephone Encounter (Signed)
Pt called back and said that a letter needs to be sent to [employer] stating when Tierney can go back to work on 11/02/21 or 11/05/21.  The letter needs to be addressed to Lorin Mercy her case number is [id number]. The fax number is [phone]

## 2021-10-02 NOTE — Telephone Encounter (Signed)
Pt lvm that her end date on her fmla should be 11/02/21. If you have any questions please call her at [phone]

## 2021-10-03 ENCOUNTER — Encounter: Payer: Self-pay | Admitting: Family Medicine

## 2021-10-03 ENCOUNTER — Other Ambulatory Visit (HOSPITAL_COMMUNITY): Payer: Self-pay

## 2021-10-03 MED ORDER — LIDOCAINE 5 % EX OINT
1.0000 "application " | TOPICAL_OINTMENT | Freq: Three times a day (TID) | CUTANEOUS | 0 refills | Status: DC | PRN
  Filled 2021-10-03: qty 35.44, 12d supply, fill #0

## 2021-10-04 ENCOUNTER — Other Ambulatory Visit (HOSPITAL_COMMUNITY): Payer: Self-pay

## 2021-10-05 ENCOUNTER — Other Ambulatory Visit (HOSPITAL_COMMUNITY): Payer: Self-pay

## 2021-10-07 ENCOUNTER — Telehealth: Payer: 59 | Admitting: Family

## 2021-10-07 DIAGNOSIS — U071 COVID-19: Secondary | ICD-10-CM | POA: Diagnosis not present

## 2021-10-07 MED ORDER — BENZONATATE 100 MG PO CAPS: 100.0000 mg | ORAL_CAPSULE | Freq: Three times a day (TID) | ORAL | 0 refills | Status: DC | PRN

## 2021-10-07 MED ORDER — MOLNUPIRAVIR EUA 200MG CAPSULE: 4.0000 | ORAL_CAPSULE | Freq: Two times a day (BID) | ORAL | 0 refills | Status: AC

## 2021-10-07 NOTE — Progress Notes (Signed)
Virtual Visit Consent   Tracy [name], you are scheduled for a virtual visit with a [facility] provider today.     Just as with appointments in the office, your consent must be obtained to participate.  Your consent will be active for this visit and any virtual visit you may have with one of our providers in the next 365 days.     If you have a MyChart account, a copy of this consent can be sent to you electronically.  All virtual visits are billed to your insurance company just like a traditional visit in the office.    As this is a virtual visit, video technology does not allow for your provider to perform a traditional examination.  This may limit your provider's ability to fully assess your condition.  If your provider identifies any concerns that need to be evaluated in person or the need to arrange testing (such as labs, EKG, etc.), we will make arrangements to do so.     Although advances in technology are sophisticated, we cannot ensure that it will always work on either your end or our end.  If the connection with a video visit is poor, the visit may have to be switched to a telephone visit.  With either a video or telephone visit, we are not always able to ensure that we have a secure connection.     I need to obtain your verbal consent now.   Are you willing to proceed with your visit today?    Tracy [name] has provided verbal consent on 10/07/2021 for a virtual visit (video or telephone).   Tracy Dun, FNP   Date: 10/07/2021 9:58 AM   Virtual Visit via Video Note   I, Tracy [name], connected with  Tracy [name]  ([id number], [birth year]) on 10/07/21 at 10:00 AM EST by a video-enabled telemedicine application and verified that I am speaking with the correct person using two identifiers.  Location: Patient: Virtual Visit Location Patient: Home Provider: Virtual Visit Location Provider: Home Office   I discussed the limitations of evaluation and management by  telemedicine and the availability of in person appointments. The patient expressed understanding and agreed to proceed.    History of Present Illness: Tracy [name] is a 56 y.o. who identifies as a female who was assigned female at birth, and is being seen today for she reports she tested positive COVID yesterday.  HPI: Cough This is a new problem. The current episode started in the past 7 days. The problem has been waxing and waning. The problem occurs every few minutes. The cough is Productive of sputum. Associated symptoms include nasal congestion, postnasal drip and sweats. Pertinent negatives include no chills, ear congestion, ear pain, fever, headaches, myalgias, shortness of breath or wheezing. She has tried rest for the symptoms.   Problems:  Patient Active Problem List   Diagnosis Date Noted   Arthritis 08/09/2021   Bilateral swelling of feet and ankles 08/09/2021   Cancer ([place]) 08/09/2021   Chewing difficulty 08/09/2021   Constipation 08/09/2021   Dental crowns present 08/09/2021   Diabetes mellitus without complication ([place]) 50/35/4656   Food allergy 08/09/2021   History of kidney stones 08/09/2021   History of migraine headaches 08/09/2021   Hypothyroidism 08/09/2021   IBS (irritable bowel syndrome) 08/09/2021   Joint pain 81/27/5170   Lichen planus 01/74/9449   Liver cirrhosis secondary to NASH (nonalcoholic steatohepatitis) ([place]) 08/09/2021   NASH (nonalcoholic steatohepatitis) 08/09/2021   Osteoarthritis 08/09/2021  Other fatigue 08/09/2021   PONV (postoperative nausea and vomiting) 08/09/2021   Shortness of breath on exertion 08/09/2021   DOE (dyspnea on exertion) 03/21/2021   Coronary artery disease 03/21/2021   Diarrhea    Gastroesophageal reflux disease    Nausea without vomiting    Gastritis and gastroduodenitis    Diverticulosis of colon without hemorrhage    Grade II internal hemorrhoids    Dyspepsia 12/14/2020   Leg swelling 09/21/2020   Morbid  obesity with BMI of 40.0-44.9, adult ([place]) 06/23/2020   Chronic left shoulder pain 05/17/2020   Oral mucosal lesion 06/17/2019   Facial rash 06/16/2019   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   Unilateral primary osteoarthritis, left hip 02/10/2019   Greater trochanteric bursitis of left hip 07/24/2018   Arthritis of left hip 06/25/2018   Primary osteoarthritis of left knee 01/21/2018   Vitamin D deficiency 01/04/2018   Lower back pain 07/18/2017   Hemorrhoids, internal 07/18/2017   Trigeminal neuralgia 01/18/2016   Bipolar disorder ([place]) 11/28/2015   Borderline personality disorder ([place]) 11/28/2015   Carpal tunnel syndrome 09/06/2015   MDD (major depressive disorder) 06/06/2015   Severe episode of recurrent major depressive disorder, without psychotic features ([place])    History of posttraumatic stress disorder (PTSD)    GAD (generalized anxiety disorder)    Insulin resistance 02/09/2015   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/2015   Acanthosis nigricans, acquired 01/26/7627   Nonalcoholic fatty liver disease 02/09/2015   Female hirsutism 02/09/2015   Infertility associated with anovulation 02/09/2015   Anxiety and depression 10/31/2014   Obstructive sleep apnea 06/27/2014   Diabetes mellitus, type 2 ([place]) 06/10/2013   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[place]) 01/01/2012   Cystic teratoma    PCOS (polycystic ovarian syndrome)    TOBACCO USE, QUIT 02/08/2010   HYPOTHYROIDISM, POSTSURGICAL 10/02/2007   POLYCYSTIC OVARIAN DISEASE 10/02/2007   Treatment-resistant depression 10/02/2007   Migraine 10/02/2007   Essential hypertension 10/02/2007   RENAL CALCULUS, HX OF 10/02/2007    Allergies:  Allergies  Allergen Reactions   Sulfa Antibiotics Itching, Swelling and Rash   Food     Walnut-mouth swelling/itching   Bupropion Hcl Itching and Rash   Codeine Itching   Sulfamethoxazole-Trimethoprim Rash   Medications:  Current Outpatient Medications:    benzonatate (TESSALON PERLES) 100 MG  capsule, Take 1 capsule (100 mg total) by mouth 3 (three) times daily as needed., Disp: 20 capsule, Rfl: 0   molnupiravir EUA (LAGEVRIO) 200 mg CAPS capsule, Take 4 capsules (800 mg total) by mouth 2 (two) times daily for 5 days., Disp: 40 capsule, Rfl: 0   Armodafinil 200 MG TABS, Take 1 tablet by mouth as needed for sleepiness, Disp: 30 tablet, Rfl: 2   clonazePAM (KLONOPIN) 1 MG tablet, Take 1 tablet by mouth 3 times daily., Disp: 90 tablet, Rfl: 2   Continuous Blood Gluc Sensor (DEXCOM G6 SENSOR) MISC, Replace every 10 days.  Use to check blood sugar., Disp: 9 each, Rfl: 1   Continuous Blood Gluc Transmit (DEXCOM G6 TRANSMITTER) MISC, Use to check continuous blood sugar.  Replace every 90 days, Disp: 1 each, Rfl: 3   cyclobenzaprine (FLEXERIL) 10 MG tablet, TAKE 1 TABLET BY MOUTH 2 TIMES DAILY AS NEEDED FOR MUSCLE SPASMS, Disp: 20 tablet, Rfl: 0   doxycycline (VIBRA-TABS) 100 MG tablet, Take 1 tablet by mouth 2 (two) times daily for 10 days., Disp: 20 tablet, Rfl: 0   Esketamine HCl, 56 MG Dose, (SPRAVATO, 56 MG DOSE,) 28 MG/DEVICE SOPK, Administer 56  mg ( 2 of the 28 mg) intranasally as directed. (Patient not taking: Reported on 09/14/2021), Disp: 2 each, Rfl: 0   Esketamine HCl, 84 MG Dose, (SPRAVATO, 84 MG DOSE,) 28 MG/DEVICE SOPK, Administer '84mg'$  intranasally once a week.  To be administered under the supervision of a healthcare professional. (Patient not taking: Reported on 09/14/2021), Disp: 6 each, Rfl: 5   ferrous sulfate 325 (65 FE) MG EC tablet, Take 1 tablet (325 mg total) by mouth in the morning and at bedtime. Take iron supplement 2 hours before or 4 hours after PPI or other antacids as this requires some level of gastric acidity to aid in absorption, Disp: 60 tablet, Rfl: 5   folic acid (FOLVITE) 1 MG tablet, Take 1 tablet (1 mg total) by mouth daily., Disp: 90 tablet, Rfl: 0   furosemide (LASIX) 40 MG tablet, TAKE 1 TABLET BY MOUTH DAILY., Disp: 30 tablet, Rfl: 3   glucose blood  (FREESTYLE LITE) test strip, Use as instructed, Disp: 100 each, Rfl: 12   hydrOXYzine (ATARAX) 25 MG tablet, Take 1 tablet (25 mg total) by mouth 3 (three) times daily as needed for anxiety., Disp: 30 tablet, Rfl: 1   insulin degludec (TRESIBA FLEXTOUCH) 100 UNIT/ML FlexTouch Pen, Inject 30 Units into the skin 2 times daily., Disp: 54 mL, Rfl: 3   insulin lispro (HUMALOG KWIKPEN) 100 UNIT/ML KwikPen, Inject up to 16 units 3 times daily at mealtime per sliding scale. (Patient not taking: Reported on 09/14/2021), Disp: 15 mL, Rfl: 11   Insulin Pen Needle (UNIFINE PENTIPS) 31G X 5 MM MISC, Use as directed to inject insulin, Disp: 100 each, Rfl: 3   lamoTRIgine (LAMICTAL) 200 MG tablet, Take 2 tablets by mouth at bedtime., Disp: 180 tablet, Rfl: 0   lidocaine (XYLOCAINE) 5 % ointment, Apply 1 application topically 3 (three) times daily as needed., Disp: 35.44 g, Rfl: 0   lisinopril (ZESTRIL) 10 MG tablet, Take 1 tablet (10 mg total) by mouth daily., Disp: 90 tablet, Rfl: 3   meloxicam (MOBIC) 15 MG tablet, Take one tablet by mouth every morning with a meal for 2 weeks, then daily as needed for pain., Disp: 30 tablet, Rfl: 0   NUVIGIL 200 MG TABS, Take 1 tablet by mouth daily as needed (sleepiness)., Disp: 30 tablet, Rfl: 2   nystatin (MYCOSTATIN/NYSTOP) powder, Apply 3 times daily as directed. (Patient taking differently: Apply 1 application topically daily.), Disp: 60 g, Rfl: 3   ondansetron (ZOFRAN-ODT) 4 MG disintegrating tablet, Dissolve 1 tablet (4 mg total) by mouth 2 (two) times daily as needed for nausea or vomiting., Disp: 20 tablet, Rfl: 1   pantoprazole (PROTONIX) 40 MG tablet, Take 2 tablets (80 mg total) by mouth 2 (two) times daily., Disp: 60 tablet, Rfl: 3   risperiDONE (RISPERDAL) 1 MG tablet, Take 1 tablet (1 mg total) by mouth at bedtime., Disp: 90 tablet, Rfl: 1   rizatriptan (MAXALT) 10 MG tablet, Take 1 tablet (10 mg total) by mouth as needed for migraine. May repeat in 2 hours if  needed (Patient taking differently: Take 10 mg by mouth every 2 (two) hours as needed for migraine. May repeat in 2 hours if needed), Disp: 10 tablet, Rfl: 0   rosuvastatin (CRESTOR) 20 MG tablet, TAKE 1 TABLET BY MOUTH DAILY., Disp: 90 tablet, Rfl: 3   Semaglutide (RYBELSUS) 14 MG TABS, Take 1 tablet by mouth daily., Disp: 90 tablet, Rfl: 1   sertraline (ZOLOFT) 100 MG tablet, TAKE 2 TABLETS BY MOUTH  EVERY MORNING, Disp: 180 tablet, Rfl: 3   spironolactone (ALDACTONE) 50 MG tablet, Take 1 tablet (50 mg total) by mouth 2 (two) times daily., Disp: 60 tablet, Rfl: 2   sucralfate (CARAFATE) 1 g tablet, Take 1 g by mouth 4 (four) times daily. (Patient not taking: Reported on 09/14/2021), Disp: , Rfl:    SYNTHROID 300 MCG tablet, TAKE 1 TABLET BY MOUTH ONCE A DAY BEFORE BREAKFAST, Disp: 90 tablet, Rfl: 1   Vitamin D, Ergocalciferol, (DRISDOL) 1.25 MG (50000 UNIT) CAPS capsule, TAKE 1 CAPSULE BY MOUTH EVERY 7 DAYS., Disp: 12 capsule, Rfl: 1  Observations/Objective: Patient is well-developed, well-nourished in no acute distress.  Resting comfortably  at home.  Head is normocephalic, atraumatic.  No labored breathing.  Speech is clear and coherent with logical content.  Patient is alert and oriented at baseline.  Nasal congestion   Assessment and Plan: 1. COVID-19 - molnupiravir EUA (LAGEVRIO) 200 mg CAPS capsule; Take 4 capsules (800 mg total) by mouth 2 (two) times daily for 5 days.  Dispense: 40 capsule; Refill: 0 - benzonatate (TESSALON PERLES) 100 MG capsule; Take 1 capsule (100 mg total) by mouth 3 (three) times daily as needed.  Dispense: 20 capsule; Refill: 0  COVID positive, rest, force fluids, tylenol as needed, Quarantine for at least 5 days and you are fever free, then must wear a mask out in public from day 8-41, report any worsening symptoms such as increased shortness of breath, swelling, or continued high fevers. Possible adverse effects discussed with antivirals.    Follow Up  Instructions: I discussed the assessment and treatment plan with the patient. The patient was provided an opportunity to ask questions and all were answered. The patient agreed with the plan and demonstrated an understanding of the instructions.  A copy of instructions were sent to the patient via MyChart unless otherwise noted below.     The patient was advised to call back or seek an in-person evaluation if the symptoms worsen or if the condition fails to improve as anticipated.  Time:  I spent 6 minutes with the patient via telehealth technology discussing the above problems/concerns.    Tracy Dun, FNP

## 2021-10-08 ENCOUNTER — Encounter: Payer: Self-pay | Admitting: Sports Medicine

## 2021-10-08 ENCOUNTER — Telehealth: Payer: Self-pay

## 2021-10-08 ENCOUNTER — Other Ambulatory Visit (HOSPITAL_COMMUNITY): Payer: Self-pay

## 2021-10-08 ENCOUNTER — Telehealth: Payer: 59 | Admitting: Sports Medicine

## 2021-10-08 ENCOUNTER — Other Ambulatory Visit: Payer: Self-pay

## 2021-10-08 DIAGNOSIS — U071 COVID-19: Secondary | ICD-10-CM | POA: Diagnosis not present

## 2021-10-08 DIAGNOSIS — F3181 Bipolar II disorder: Secondary | ICD-10-CM | POA: Diagnosis not present

## 2021-10-08 HISTORY — DX: COVID-19: U07.1

## 2021-10-08 MED ORDER — HYDROCODONE BIT-HOMATROP MBR 5-1.5 MG/5ML PO SOLN: 5.0000 mL | Freq: Three times a day (TID) | ORAL | 0 refills | Status: DC | PRN

## 2021-10-08 MED ORDER — PREDNISONE 50 MG PO TABS
50.0000 mg | ORAL_TABLET | Freq: Every day | ORAL | 0 refills | Status: DC
  Filled 2021-10-08: qty 5, 5d supply, fill #0

## 2021-10-08 MED ORDER — PREDNISONE 50 MG PO TABS: 50.0000 mg | ORAL_TABLET | Freq: Every day | ORAL | 0 refills | Status: DC

## 2021-10-08 MED ORDER — HYDROCODONE BIT-HOMATROP MBR 5-1.5 MG/5ML PO SOLN
5.0000 mL | Freq: Three times a day (TID) | ORAL | 0 refills | Status: DC | PRN
  Filled 2021-10-08: qty 120, 8d supply, fill #0

## 2021-10-08 NOTE — Telephone Encounter (Signed)
Patient scheduled.

## 2021-10-08 NOTE — Progress Notes (Signed)
? ?  Virtual Visit via WebEx/MyChart ?  ?I connected with  Tracy [name]  on 10/08/21 via WebEx/MyChart/Doximity Video and verified that I am speaking with the correct person using two identifiers. ?  ?I discussed the limitations, risks, security and privacy concerns of performing an evaluation and management service by WebEx/MyChart/Doximity Video, including the higher likelihood of inaccurate diagnosis and treatment, and the availability of in person appointments.  We also discussed the likely need of an additional face to face encounter for complete and high quality delivery of care.  I also discussed with the patient that there may be a patient responsible charge related to this service. The patient expressed understanding and wishes to proceed. ? ?Provider location is in medical facility. ?Patient location is at their home, different from provider location. ?People involved in care of the patient during this telehealth encounter were myself, my nurse/medical assistant, and my front office/scheduling team member. ? ?Review of Systems: No fevers, chills, night sweats, weight loss, chest pain, or shortness of breath.  ? ?Objective Findings:   ? ?General: Speaking full sentences, no audible heavy breathing.  Sounds alert and appropriately interactive.  Appears well.  Face symmetric.  Extraocular movements intact.  Pupils equal and round.  No nasal flaring or accessory muscle use visualized. ? ?Independent interpretation of tests performed by another provider:  ? ?None. ? ?Brief History, Exam, Impression, and Recommendations:   ? 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 ?She is vaccinated 5 times. ?Due to her symptoms we will add a 5-day burst of prednisone, Hycodan syrup through the day for her headache and to help her sleep at night. ?I like for her to  touch base with [place] sometime later this week to make sure things are improving. ? ? ?I discussed the above assessment and treatment plan with the patient. The patient was provided an opportunity to ask questions and all were answered. The patient agreed with the plan and demonstrated an understanding of the instructions. ?  ?The patient was advised to call back or seek an in-person evaluation if the symptoms worsen or if the condition fails to improve as anticipated. ?  ?I provided 30 minutes of face to face and non-face-to-face time during this encounter date, time was needed to gather information, review chart, records, communicate/coordinate with staff remotely, as well as complete documentation. ? ? ?___________________________________________ ?[name], M.D., ABFM., CAQSM. ?Primary Care and Sports Medicine ?[place] ? ?Adjunct Instructor of Family Medicine  ?[employer] ?

## 2021-10-08 NOTE — Telephone Encounter (Signed)
Tracy [name] called and left a message stating she was wanting her medications sent to [facility] on [address] instead of the Outpatient pharmacy. Pended prescriptions.  ?

## 2021-10-08 NOTE — Telephone Encounter (Signed)
Tracy [name] called stating the [employer] did receive her paperwork.  But, [employer] has received the letter verifying her absence from work.  Please send in the requested information to [employer]. ?

## 2021-10-08 NOTE — Assessment & Plan Note (Signed)
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 ?She is vaccinated 5 times. ?Due to her symptoms we will add a 5-day burst of prednisone, Hycodan syrup through the day for her headache and to help her sleep at night. ?I like for her to touch base with [place] sometime later this week to make sure things are improving. ?

## 2021-10-08 NOTE — Telephone Encounter (Signed)
Okay switching. ?

## 2021-10-08 NOTE — Telephone Encounter (Signed)
LVM for patient to call back to schedule a virtual visit.  AM ?

## 2021-10-09 ENCOUNTER — Other Ambulatory Visit: Payer: Self-pay | Admitting: Family Medicine

## 2021-10-09 ENCOUNTER — Other Ambulatory Visit (HOSPITAL_COMMUNITY): Payer: Self-pay

## 2021-10-09 ENCOUNTER — Telehealth: Payer: Self-pay | Admitting: Adult Health

## 2021-10-09 MED ORDER — SPIRONOLACTONE 50 MG PO TABS
50.0000 mg | ORAL_TABLET | Freq: Two times a day (BID) | ORAL | 2 refills | Status: DC
  Filled 2021-10-09: qty 180, 90d supply, fill #0
  Filled 2021-10-31: qty 180, 90d supply, fill #1
  Filled 2021-12-26: qty 20, 10d supply, fill #1
  Filled 2021-12-27: qty 180, 90d supply, fill #1
  Filled 2021-12-27: qty 160, 80d supply, fill #1
  Filled 2022-04-25: qty 79, 40d supply, fill #2
  Filled 2022-04-25: qty 101, 50d supply, fill #2

## 2021-10-09 MED ORDER — HYDROXYZINE HCL 25 MG PO TABS
25.0000 mg | ORAL_TABLET | Freq: Three times a day (TID) | ORAL | 3 refills | Status: DC | PRN
  Filled 2021-10-09: qty 90, 30d supply, fill #0
  Filled 2021-11-27: qty 90, 30d supply, fill #1
  Filled 2021-12-26: qty 90, 30d supply, fill #2

## 2021-10-09 NOTE — Telephone Encounter (Signed)
[employer] is doing that form. ?

## 2021-10-09 NOTE — Telephone Encounter (Signed)
I do not have a [employer] form but I completed [employer] forms, they were signed and faxed. ? ?I did receive a copy of Tracy [name]'s FMLA approval effective 09/08/2021-10/28/2021 this is from [employer]; [facility]. Will give copy to [facility] for review.  ?

## 2021-10-09 NOTE — Telephone Encounter (Signed)
Pt lvm that she needs the [employer] form faxed immediately . If they don't receive it asap it will be closed and she will not be able to get paid.Marland Kitchen Please send it in immediately. ?

## 2021-10-10 ENCOUNTER — Other Ambulatory Visit (HOSPITAL_COMMUNITY): Payer: Self-pay

## 2021-10-15 ENCOUNTER — Other Ambulatory Visit (HOSPITAL_COMMUNITY): Payer: Self-pay

## 2021-10-18 ENCOUNTER — Encounter: Payer: Self-pay | Admitting: Family Medicine

## 2021-10-18 ENCOUNTER — Telehealth (INDEPENDENT_AMBULATORY_CARE_PROVIDER_SITE_OTHER): Payer: 59 | Admitting: Family Medicine

## 2021-10-18 DIAGNOSIS — U071 COVID-19: Secondary | ICD-10-CM | POA: Diagnosis not present

## 2021-10-18 MED ORDER — ALBUTEROL SULFATE HFA 108 (90 BASE) MCG/ACT IN AERS: 2.0000 | INHALATION_SPRAY | Freq: Four times a day (QID) | RESPIRATORY_TRACT | 3 refills | Status: DC | PRN

## 2021-10-18 NOTE — Progress Notes (Signed)
Current symptoms: chest tightness, cough ? ?Taking cough syrup. Using old inhaler. Helps with the cough.  ? ?Finished Paxlovid. ?

## 2021-10-18 NOTE — Assessment & Plan Note (Signed)
Completed course of paxlovid as well as prednisone with improvement.  Some residual cough and chest tightness relieved by albuterol.  Rx for inhaler renewed today.  Follow up if symptoms do not continue improvement or if having new/worsening symptoms.  ? ?

## 2021-10-18 NOTE — Progress Notes (Signed)
Tracy [name] - 56 y.o. female MRN [id number]  Date of birth: 20-Sep-1965   This visit type was conducted due to national recommendations for restrictions regarding the COVID-19 Pandemic (e.g. social distancing).  This format is felt to be most appropriate for this patient at this time.  All issues noted in this document were discussed and addressed.  No physical exam was performed (except for noted visual exam findings with Video Visits).  I discussed the limitations of evaluation and management by telemedicine and the availability of in person appointments. The patient expressed understanding and agreed to proceed.  I connected withNAME@ on 10/18/21 at  1:10 PM EDT by a video enabled telemedicine application and verified that I am speaking with the correct person using two identifiers.  Present at visit: Everrett Coombe, DO [facility]   Patient Location: Home [address] [place] [address]   Provider location:   [facility]  No chief complaint on file.   HPI  Tracy [name] is a 56 y.o. female who presents via audio/video conferencing for a telehealth visit today.  She is following up from recent COVID infection.  Initially had video visit on 10/07/2021 after testing positive for COVID on 3/1.  Started on Paxlovid.  Seen virtually by Dr. Benjamin Stain on 10/08/2020 and prednisone and cough medication were added.  She has continued to improve gradually since that time.  Still having some cough and chest tightness.  She did find an old albuterol inhaler at home and had improvement after using this.  She is requesting a renewal of this inhaler.     ROS:  A comprehensive ROS was completed and negative except as noted per HPI  Past Medical History:  Diagnosis Date   Acanthosis nigricans, acquired 02/09/2015   Anxiety and depression 10/31/2014   Arthritis    back, knees, right elbow   Arthritis of left hip 06/25/2018   Bilateral swelling of feet and ankles    Bipolar  disorder (HCC)    Borderline personality disorder (HCC)    Cancer (HCC)    oral   Carpal tunnel syndrome 09/06/2015   Chewing difficulty    Chronic left shoulder pain 05/17/2020   Constipation    Coronary artery disease 03/21/2021   Cystic teratoma    BENIGN   Dental crowns present    Diabetes mellitus without complication (HCC)    Diabetes mellitus, type 2 (HCC) 06/10/2013   Diarrhea    Diverticulosis of colon without hemorrhage    DOE (dyspnea on exertion) 03/21/2021   Dyspepsia 12/14/2020   Essential hypertension 10/02/2007   Qualifier: Diagnosis of  By: Everardo All MD, Gregary Signs A    Facial rash 06/16/2019   Female hirsutism 02/09/2015   Food allergy    Walnuts   GAD (generalized anxiety disorder)    Gastritis and gastroduodenitis    Gastroesophageal reflux disease    Grade II internal hemorrhoids    Greater trochanteric bursitis of left hip 07/24/2018   Hemorrhoids, internal 07/18/2017   History of kidney stones    History of migraine headaches    History of posttraumatic stress disorder (PTSD)    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/2015   Hypothyroidism    HYPOTHYROIDISM, POSTSURGICAL 10/02/2007   Qualifier: Diagnosis of  By: Everardo All MD, Sean A    IBS (irritable bowel syndrome)    Infertility associated with anovulation 02/09/2015   Insulin resistance 02/09/2015   Joint pain    Leg swelling 09/21/2020   Lichen planus    Liver cirrhosis  secondary to NASH (nonalcoholic steatohepatitis) (HCC)    Lower back pain 07/18/2017   MDD (major depressive disorder) 06/06/2015   Migraine 10/02/2007   Morbid obesity with BMI of 40.0-44.9, adult (HCC) 06/23/2020   NASH (nonalcoholic steatohepatitis)    Nausea without vomiting    Nonalcoholic fatty liver disease 02/09/2015   Obstructive sleep apnea 06/27/2014   Oral mucosal lesion 06/17/2019   Osteoarthritis    Other fatigue    PCOS (polycystic ovarian syndrome)    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   POLYCYSTIC OVARIAN DISEASE 10/02/2007   Qualifier:  Diagnosis of  By: Everardo All MD, Gregary Signs A    PONV (postoperative nausea and vomiting)    also hx. 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   Qualifier: Diagnosis of  By: Everardo All MD, Sean A    Trigeminal neuralgia    Unilateral primary osteoarthritis, left hip 02/10/2019   Vitamin D deficiency     Past Surgical History:  Procedure Laterality Date   ACHILLES TENDON SURGERY Left 2007   APPENDECTOMY     CARPAL TUNNEL RELEASE Right 01/12/2016   Procedure: RIGHT CARPAL TUNNEL RELEASE;  Surgeon: Dominica Severin, MD;  Location: [facility];  Service: Orthopedics;  Laterality: Right;   COLONOSCOPY WITH PROPOFOL N/A 04/20/2014   Procedure: COLONOSCOPY WITH PROPOFOL;  Surgeon: Willis Modena, MD;  Location: [facility];  Service: Endoscopy;  Laterality: N/A;   COLONOSCOPY WITH PROPOFOL N/A 01/10/2021   Procedure: COLONOSCOPY WITH PROPOFOL;  Surgeon: Shellia Cleverly, DO;  Location: [facility];  Service: Gastroenterology;  Laterality: N/A;   ESOPHAGOGASTRODUODENOSCOPY (EGD) WITH PROPOFOL N/A 04/20/2014   Procedure: ESOPHAGOGASTRODUODENOSCOPY (EGD) WITH PROPOFOL;  Surgeon: Willis Modena, MD;  Location: [facility];  Service: Endoscopy;  Laterality: N/A;   ESOPHAGOGASTRODUODENOSCOPY (EGD) WITH PROPOFOL N/A 01/10/2021   Procedure: ESOPHAGOGASTRODUODENOSCOPY (EGD) WITH PROPOFOL;  Surgeon: Shellia Cleverly, DO;  Location: [facility];  Service: Gastroenterology;  Laterality: N/A;   EXCISION HAGLUND'S DEFORMITY WITH ACHILLES TENDON REPAIR Right 02/25/2013   Procedure: RIGHT ACHILLES  DEBRIDEMENT AND RECONSTRUCTION;  HAGLUND'S EXCISION; GASTROC RECESSION AND FLEXOR HALLUCIS LONGUS TRANSFER;  Surgeon: Toni Arthurs, MD;  Location: [facility];  Service: Orthopedics;  Laterality: Right;   JOINT REPLACEMENT     hip   LUMBAR LAMINECTOMY     X 3   neck fusion     OVARIAN CYST SURGERY Left 1998   PILONIDAL CYST EXCISION  1994   RIGHT OOPHORECTOMY     STERIOD INJECTION Left 01/12/2016   Procedure: STEROID INJECTION LEFT WRIST;  Surgeon: Dominica Severin, MD;  Location: [facility];  Service: Orthopedics;  Laterality: Left;   TOTAL HIP ARTHROPLASTY Left 03/19/2019   Procedure: LEFT TOTAL HIP ARTHROPLASTY ANTERIOR APPROACH;  Surgeon: Kathryne Hitch, MD;  Location: [facility];  Service: Orthopedics;  Laterality: Left;   TOTAL THYROIDECTOMY  2003   UPPER GI ENDOSCOPY  01/19/2015    Family History  Problem Relation Age of Onset   Heart disease Father    Depression Father    Heart attack  Father    Sudden death Father    Anxiety disorder Father    Bipolar disorder Father    Obesity Father    Cancer Brother        THROAT cancer   Depression Brother    Anxiety disorder Brother    Depression Sister    Breast cancer Mother    Hypertension Mother    Cancer Mother    Depression Mother    Sleep apnea Mother    Obesity Mother    Depression Maternal Grandmother    Stroke Maternal Grandfather    Colon cancer Brother    Pancreatic cancer Neg Hx    Stomach cancer Neg Hx    Liver disease Neg Hx     Social History   Socioeconomic History   Marital status: Divorced    Spouse name: Not on file   Number of children: 0   Years of education: 16   Highest education level: Not on file  Occupational History   Occupation: RN-Telephone Oceanographer: [employer]    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   Drug use: No   Sexual activity: Not Currently    Partners: Male    Birth control/protection: Condom  Other Topics Concern   Not on file  Social History Narrative   RN - [employer]   Regular exercise: no   Caffeine use: 2 x daily   Right-handed.   Lives alone.   Social Determinants of Health   Financial Resource Strain: Not on file  Food Insecurity: Not on file  Transportation Needs: Not on file  Physical Activity: Not on file  Stress: Not on file  Social Connections: Not on file  Intimate Partner Violence: Not on file     Current Outpatient Medications:    albuterol (VENTOLIN HFA) 108 (90 Base) MCG/ACT inhaler, Inhale 2 puffs into the lungs every 6 (six) hours as needed for wheezing or shortness of breath., Disp: 8 g, Rfl: 3   Armodafinil 200 MG TABS, Take 1 tablet by mouth as needed for sleepiness, Disp: 30 tablet, Rfl: 2   benzonatate (TESSALON PERLES) 100 MG capsule, Take 1 capsule (100 mg total) by mouth 3 (three) times daily as needed., Disp: 20 capsule, Rfl: 0   clonazePAM (KLONOPIN) 1 MG tablet, Take 1 tablet by mouth 3 times daily., Disp: 90 tablet, Rfl: 2   Continuous Blood Gluc Sensor (DEXCOM G6 SENSOR) MISC, Replace every 10 days.  Use to check blood sugar., Disp: 9 each, Rfl: 1   Continuous Blood Gluc Transmit (DEXCOM G6 TRANSMITTER) MISC, Use to check continuous blood sugar.  Replace every 90 days, Disp: 1 each, Rfl: 3   cyclobenzaprine (FLEXERIL) 10 MG tablet, TAKE 1 TABLET BY MOUTH 2 TIMES DAILY AS NEEDED FOR MUSCLE SPASMS, Disp: 20 tablet, Rfl: 0   doxycycline (VIBRA-TABS) 100 MG tablet, Take 1 tablet by mouth 2 (two) times daily for 10 days., Disp: 20 tablet, Rfl: 0  Esketamine HCl,  56 MG Dose, (SPRAVATO, 56 MG DOSE,) 28 MG/DEVICE SOPK, Administer 56 mg ( 2 of the 28 mg) intranasally as directed. (Patient not taking: Reported on 09/14/2021), Disp: 2 each, Rfl: 0   Esketamine HCl, 84 MG Dose, (SPRAVATO, 84 MG DOSE,) 28 MG/DEVICE SOPK, Administer 84mg  intranasally once a week.  To be administered under the supervision of a healthcare professional. (Patient not taking: Reported on 09/14/2021), Disp: 6 each, Rfl: 5   ferrous sulfate 325 (65 FE) MG EC tablet, Take 1 tablet (325 mg total) by mouth in the morning and at bedtime. Take iron supplement 2 hours before or 4 hours after PPI or other antacids as this requires some level of gastric acidity to aid in absorption, Disp: 60 tablet, Rfl: 5   folic acid (FOLVITE) 1 MG tablet, Take 1 tablet (1 mg total) by mouth daily., Disp: 90 tablet, Rfl: 0   furosemide (LASIX) 40 MG tablet, TAKE 1 TABLET BY MOUTH DAILY., Disp: 30 tablet, Rfl: 3   glucose blood (FREESTYLE LITE) test strip, Use as instructed, Disp: 100 each, Rfl: 12   HYDROcodone bit-homatropine (HYCODAN) 5-1.5 MG/5ML syrup, Take 5 mLs by mouth every 8 hours as needed for cough., Disp: 120 mL, Rfl: 0   hydrOXYzine (ATARAX) 25 MG tablet, Take 1 tablet (25 mg total) by mouth 3 (three) times daily as needed for anxiety., Disp: 90 tablet, Rfl: 3   insulin lispro (HUMALOG KWIKPEN) 100 UNIT/ML KwikPen, Inject up to 16 units 3 times daily at mealtime per sliding scale. (Patient not taking: Reported on 09/14/2021), Disp: 15 mL, Rfl: 11   Insulin Pen Needle (UNIFINE PENTIPS) 31G X 5 MM MISC, Use as directed to inject insulin, Disp: 100 each, Rfl: 3   lamoTRIgine (LAMICTAL) 200 MG tablet, Take 2 tablets by mouth at bedtime., Disp: 180 tablet, Rfl: 0   lidocaine (XYLOCAINE) 5 % ointment, Apply 1 application topically 3 (three) times daily as needed., Disp: 35.44 g, Rfl: 0   lisinopril (ZESTRIL) 10 MG tablet, Take 1 tablet (10 mg total) by mouth daily., Disp: 90 tablet, Rfl: 3   meloxicam (MOBIC) 15  MG tablet, Take one tablet by mouth every morning with a meal for 2 weeks, then daily as needed for pain., Disp: 30 tablet, Rfl: 0   NUVIGIL 200 MG TABS, Take 1 tablet by mouth daily as needed (sleepiness)., Disp: 30 tablet, Rfl: 2   nystatin (MYCOSTATIN/NYSTOP) powder, Apply 3 times daily as directed. (Patient taking differently: Apply 1 application topically daily.), Disp: 60 g, Rfl: 3   ondansetron (ZOFRAN-ODT) 4 MG disintegrating tablet, Dissolve 1 tablet (4 mg total) by mouth 2 (two) times daily as needed for nausea or vomiting., Disp: 20 tablet, Rfl: 1   pantoprazole (PROTONIX) 40 MG tablet, Take 2 tablets (80 mg total) by mouth 2 (two) times daily., Disp: 60 tablet, Rfl: 3   predniSONE (DELTASONE) 50 MG tablet, Take 1 tablet  by mouth daily., Disp: 5 tablet, Rfl: 0   risperiDONE (RISPERDAL) 1 MG tablet, Take 1 tablet (1 mg total) by mouth at bedtime., Disp: 90 tablet, Rfl: 1   rizatriptan (MAXALT) 10 MG tablet, Take 1 tablet (10 mg total) by mouth as needed for migraine. May repeat in 2 hours if needed (Patient taking differently: Take 10 mg by mouth every 2 (two) hours as needed for migraine. May repeat in 2 hours if needed), Disp: 10 tablet, Rfl: 0   rosuvastatin (CRESTOR) 20 MG tablet, TAKE 1 TABLET BY MOUTH DAILY., Disp:  90 tablet, Rfl: 3   Semaglutide (RYBELSUS) 14 MG TABS, Take 1 tablet by mouth daily., Disp: 90 tablet, Rfl: 1   sertraline (ZOLOFT) 100 MG tablet, TAKE 2 TABLETS BY MOUTH EVERY MORNING, Disp: 180 tablet, Rfl: 3   spironolactone (ALDACTONE) 50 MG tablet, Take 1 tablet (50 mg total) by mouth 2 (two) times daily., Disp: 180 tablet, Rfl: 2   sucralfate (CARAFATE) 1 g tablet, Take 1 g by mouth 4 (four) times daily. (Patient not taking: Reported on 09/14/2021), Disp: , Rfl:    SYNTHROID 300 MCG tablet, TAKE 1 TABLET BY MOUTH ONCE A DAY BEFORE BREAKFAST, Disp: 90 tablet, Rfl: 1   Vitamin D, Ergocalciferol, (DRISDOL) 1.25 MG (50000 UNIT) CAPS capsule, TAKE 1 CAPSULE BY MOUTH EVERY 7  DAYS., Disp: 12 capsule, Rfl: 1  EXAM:  VITALS per patient if applicable: Temp (!) 101.5 F (38.6 C)   Ht 5\' 11"  (1.803 m)   Wt (!) 315 lb (142.9 kg)   LMP 08/24/2020   BMI 43.93 kg/m   GENERAL: alert, oriented, appears well and in no acute distress  HEENT: atraumatic, conjunttiva clear, no obvious abnormalities on inspection of external nose and ears  NECK: normal movements of the head and neck  LUNGS: on inspection no signs of respiratory distress, breathing rate appears normal, no obvious gross SOB, gasping or wheezing  CV: no obvious cyanosis  MS: moves all visible extremities without noticeable abnormality  PSYCH/NEURO: pleasant and cooperative, no obvious depression or anxiety, speech and thought processing grossly intact  ASSESSMENT AND PLAN:  Discussed the following assessment and plan:  COVID Completed course of paxlovid as well as prednisone with improvement.  Some residual cough and chest tightness relieved by albuterol.  Rx for inhaler renewed today.  Follow up if symptoms do not continue improvement or if having new/worsening symptoms.      I discussed the assessment and treatment plan with the patient. The patient was provided an opportunity to ask questions and all were answered. The patient agreed with the plan and demonstrated an understanding of the instructions.   The patient was advised to call back or seek an in-person evaluation if the symptoms worsen or if the condition fails to improve as anticipated.    Everrett Coombe, DO

## 2021-10-23 ENCOUNTER — Telehealth: Payer: Self-pay | Admitting: Adult Health

## 2021-10-23 NOTE — Telephone Encounter (Signed)
[place] forms faxed again 3/17 ?

## 2021-10-25 ENCOUNTER — Telehealth: Payer: Self-pay | Admitting: Adult Health

## 2021-10-25 DIAGNOSIS — F3181 Bipolar II disorder: Secondary | ICD-10-CM | POA: Diagnosis not present

## 2021-10-25 NOTE — Telephone Encounter (Signed)
Pt LVM requesting to change return to work  week one to 2 days for first week, 3 days second week, then full week on third week. Pt still having focus issues and it's a lot to go back full week at the beginning. Forms can be changed or information on letter head. Fax to them ?

## 2021-10-26 NOTE — Telephone Encounter (Signed)
Tracy [name] can do that on Monday - she has the paperwork and is out of the office today.

## 2021-10-29 ENCOUNTER — Ambulatory Visit: Payer: 59 | Admitting: Adult Health

## 2021-10-31 ENCOUNTER — Other Ambulatory Visit (HOSPITAL_COMMUNITY): Payer: Self-pay

## 2021-10-31 ENCOUNTER — Other Ambulatory Visit: Payer: Self-pay | Admitting: Family Medicine

## 2021-10-31 ENCOUNTER — Other Ambulatory Visit: Payer: Self-pay | Admitting: Adult Health

## 2021-10-31 ENCOUNTER — Ambulatory Visit (INDEPENDENT_AMBULATORY_CARE_PROVIDER_SITE_OTHER): Payer: 59 | Admitting: Adult Health

## 2021-10-31 ENCOUNTER — Encounter: Payer: Self-pay | Admitting: Adult Health

## 2021-10-31 ENCOUNTER — Other Ambulatory Visit: Payer: Self-pay

## 2021-10-31 DIAGNOSIS — F411 Generalized anxiety disorder: Secondary | ICD-10-CM | POA: Diagnosis not present

## 2021-10-31 DIAGNOSIS — F410 Panic disorder [episodic paroxysmal anxiety] without agoraphobia: Secondary | ICD-10-CM | POA: Diagnosis not present

## 2021-10-31 DIAGNOSIS — F3181 Bipolar II disorder: Secondary | ICD-10-CM | POA: Diagnosis not present

## 2021-10-31 DIAGNOSIS — F339 Major depressive disorder, recurrent, unspecified: Secondary | ICD-10-CM

## 2021-10-31 DIAGNOSIS — F4310 Post-traumatic stress disorder, unspecified: Secondary | ICD-10-CM | POA: Diagnosis not present

## 2021-10-31 DIAGNOSIS — M1712 Unilateral primary osteoarthritis, left knee: Secondary | ICD-10-CM

## 2021-10-31 MED ORDER — MELOXICAM 15 MG PO TABS
ORAL_TABLET | ORAL | 0 refills | Status: DC
  Filled 2021-10-31: qty 30, 30d supply, fill #0

## 2021-10-31 NOTE — Telephone Encounter (Signed)
Noted. Updated on her paper work and will fax ?

## 2021-10-31 NOTE — Progress Notes (Signed)
Tracy [name] ?[id number] ?[birth year] ?56 y.o. ? ?Subjective:  ? ?Patient ID:  Tracy [name] is a 56 y.o. (DOB 05-Aug-1966) female. ? ?Chief Complaint: No chief complaint on file. ? ? ?HPI ?Tracy [name] presents to the office today for follow-up of MDD, GAD, PTSD, and Panic attacks. ? ?Describes mood today as "ok". Pleasant. Flat. Decreased tearfulness. Mood symptoms - reports decreased depression and irritability. Feels anxious - "on a daily basis". Denies recent panic attack - "almost 3 weeks".  ?Reports some worry and rumination. Recently able to make herself dinner - "first tine in over a rear and a half". Has started bathing more. Has washed her clothes. Working on getting her house cleaned. Stating "I'm trying to do better". Working with therapist to complete her DBT workbook. Awaiting to get into a DBT program. Improved interest and motivation. Taking medications as prescribed. ?Energy levels poor. Active, does not have a regular exercise routine.  ?Enjoys some usual interests and activities. Lives alone with 3 dogs. Recently divorced. Siblings local. Spending time with family. ?Appetite adequate. Weight stable - 300 pounds.  ?Sleeps well most nights. Averages 8 to 10 hours. Sleep apnea - taking Nuvigil in the morning.  ?Denies SI or HI. ?Denies AH or VH.  ?Reports passive SI. ? ?Previous medication trials: ? ?Antipsychotics: Vraylar-TD, Latuda, Abilify, Geodon, Rexulti, Lybalvi, Risperdal ? ?Mood Stabilizers - Depakote, Lithium, Lamictal, Equetro, Topamax, Trileptal, Gabapentin ? ?SSRI - Zoloft, Lexapro, Celexa, Prozac, Viibryd, Trintellix, Prozac ? ?SNRI - Pristiq, Effexor, Cymbalta ? ?Wellbutrin - allergic reaction ? ?Anti-anxiety - Buspar, Xanax, Clonazepam, Ativan, Valium ? ?Sleep agents - Trazadone, Ambien, Restoril  ? ?Other - Deplin, Emsam, Serzone, Doxepin, Nuvigil, Vistaril ? ?Previous treatment: ECT and [facility] ? ? ? ? ?GAD-7   ? ?[place] Office Visit from 03/14/2021 in [place] Office Visit from 02/14/2021 in [place] Office Visit from 03/01/2020 in [place]  ?Total GAD-7 Score '17 12 11  '$ ? ?  ? ?PHQ2-9   ? ?[place] Office Visit from 03/14/2021 in [place] Office Visit from 02/14/2021 in [place] Office Visit from 10/11/2020 in [place] Office Visit from 03/01/2020 in [place] Office Visit from 06/16/2019 in [place] at [facility]  ?PHQ-2 Total Score '6 6 6 3 2  '$ ?PHQ-9 Total Score '19 21 19 9 9  '$ ? ?  ? ?[place] ED from 02/11/2021 in [facility] from 02/10/2021 in [facility] Admission (Discharged) from 01/10/2021 in [place]  ?C-SSRS RISK CATEGORY No Risk No Risk No Risk  ? ?  ?  ? ?Review of Systems:  ?Review of Systems  ?Musculoskeletal:  Negative for gait problem.  ?Neurological:  Negative for tremors.  ?Psychiatric/Behavioral:    ?     Please refer to HPI  ? ?Medications: I have reviewed the patient's current medications. ? ?Current Outpatient Medications  ?Medication Sig Dispense Refill  ? albuterol (VENTOLIN HFA) 108 (90 Base) MCG/ACT inhaler Inhale 2 puffs into the lungs every 6 (six) hours as needed for wheezing or shortness of breath. 8 g 3  ? Armodafinil 200 MG TABS Take 1 tablet by mouth as needed for sleepiness 30 tablet 2  ? benzonatate (TESSALON PERLES) 100 MG capsule Take 1 capsule (100  mg total) by mouth 3 (three) times daily as needed. 20 capsule 0  ? clonazePAM (KLONOPIN) 1 MG tablet Take 1 tablet by mouth 3 times daily. 90 tablet 2  ? Continuous Blood Gluc Sensor (DEXCOM G6 SENSOR) MISC Replace every 10 days.  Use to check blood sugar. 9 each 1  ? Continuous Blood Gluc Transmit (DEXCOM G6 TRANSMITTER) MISC Use to check  continuous blood sugar.  Replace every 90 days 1 each 3  ? cyclobenzaprine (FLEXERIL) 10 MG tablet TAKE 1 TABLET BY MOUTH 2 TIMES DAILY AS NEEDED FOR MUSCLE SPASMS 20 tablet 0  ? doxycycline (VIBRA-TABS) 100 MG tablet Take 1 tablet by mouth 2 (two) times daily for 10 days. 20 tablet 0  ? Esketamine HCl, 56 MG Dose, (SPRAVATO, 56 MG DOSE,) 28 MG/DEVICE SOPK Administer 56 mg ( 2 of the 28 mg) intranasally as directed. (Patient not taking: Reported on 09/14/2021) 2 each 0  ? Esketamine HCl, 84 MG Dose, (SPRAVATO, 84 MG DOSE,) 28 MG/DEVICE SOPK Administer '84mg'$  intranasally once a week.  To be administered under the supervision of a healthcare professional. (Patient not taking: Reported on 09/14/2021) 6 each 5  ? ferrous sulfate 325 (65 FE) MG EC tablet Take 1 tablet (325 mg total) by mouth in the morning and at bedtime. Take iron supplement 2 hours before or 4 hours after PPI or other antacids as this requires some level of gastric acidity to aid in absorption 60 tablet 5  ? folic acid (FOLVITE) 1 MG tablet Take 1 tablet (1 mg total) by mouth daily. 90 tablet 0  ? furosemide (LASIX) 40 MG tablet TAKE 1 TABLET BY MOUTH DAILY. 30 tablet 3  ? glucose blood (FREESTYLE LITE) test strip Use as instructed 100 each 12  ? HYDROcodone bit-homatropine (HYCODAN) 5-1.5 MG/5ML syrup Take 5 mLs by mouth every 8 hours as needed for cough. 120 mL 0  ? hydrOXYzine (ATARAX) 25 MG tablet Take 1 tablet (25 mg total) by mouth 3 (three) times daily as needed for anxiety. 90 tablet 3  ? insulin lispro (HUMALOG KWIKPEN) 100 UNIT/ML KwikPen Inject up to 16 units 3 times daily at mealtime per sliding scale. (Patient not taking: Reported on 09/14/2021) 15 mL 11  ? Insulin Pen Needle (UNIFINE PENTIPS) 31G X 5 MM MISC Use as directed to inject insulin 100 each 3  ? lamoTRIgine (LAMICTAL) 200 MG tablet Take 2 tablets by mouth at bedtime. 180 tablet 0  ? lidocaine (XYLOCAINE) 5 % ointment Apply 1 application topically 3 (three) times daily as needed.  35.44 g 0  ? lisinopril (ZESTRIL) 10 MG tablet Take 1 tablet (10 mg total) by mouth daily. 90 tablet 3  ? meloxicam (MOBIC) 15 MG tablet Take one tablet by mouth every morning with a meal for 2 weeks, then daily as needed for pain. 30 tablet 0  ? NUVIGIL 200 MG TABS Take 1 tablet by mouth daily as needed (sleepiness). 30 tablet 2  ? nystatin (MYCOSTATIN/NYSTOP) powder Apply 3 times daily as directed. (Patient taking differently: Apply 1 application topically daily.) 60 g 3  ? ondansetron (ZOFRAN-ODT) 4 MG disintegrating tablet Dissolve 1 tablet (4 mg total) by mouth 2 (two) times daily as needed for nausea or vomiting. 20 tablet 1  ? pantoprazole (PROTONIX) 40 MG tablet Take 2 tablets (80 mg total) by mouth 2 (two) times daily. 60 tablet 3  ? predniSONE (DELTASONE) 50 MG tablet Take 1 tablet  by mouth daily. 5 tablet 0  ? risperiDONE (  RISPERDAL) 1 MG tablet Take 1 tablet (1 mg total) by mouth at bedtime. 90 tablet 1  ? rizatriptan (MAXALT) 10 MG tablet Take 1 tablet (10 mg total) by mouth as needed for migraine. May repeat in 2 hours if needed (Patient taking differently: Take 10 mg by mouth every 2 (two) hours as needed for migraine. May repeat in 2 hours if needed) 10 tablet 0  ? rosuvastatin (CRESTOR) 20 MG tablet TAKE 1 TABLET BY MOUTH DAILY. 90 tablet 3  ? Semaglutide (RYBELSUS) 14 MG TABS Take 1 tablet by mouth daily. 90 tablet 1  ? sertraline (ZOLOFT) 100 MG tablet TAKE 2 TABLETS BY MOUTH EVERY MORNING 180 tablet 3  ? spironolactone (ALDACTONE) 50 MG tablet Take 1 tablet (50 mg total) by mouth 2 (two) times daily. 180 tablet 2  ? sucralfate (CARAFATE) 1 g tablet Take 1 g by mouth 4 (four) times daily. (Patient not taking: Reported on 09/14/2021)    ? SYNTHROID 300 MCG tablet TAKE 1 TABLET BY MOUTH ONCE A DAY BEFORE BREAKFAST 90 tablet 1  ? Vitamin D, Ergocalciferol, (DRISDOL) 1.25 MG (50000 UNIT) CAPS capsule TAKE 1 CAPSULE BY MOUTH EVERY 7 DAYS. 12 capsule 1  ? ?No current facility-administered medications  for this visit.  ? ? ?Medication Side Effects: None ? ?Allergies:  ?Allergies  ?Allergen Reactions  ? Sulfa Antibiotics Itching, Swelling and Rash  ? Food   ?  Walnut-mouth swelling/itching  ? Bupropion Hcl Itc

## 2021-11-01 ENCOUNTER — Other Ambulatory Visit (HOSPITAL_COMMUNITY): Payer: Self-pay

## 2021-11-02 ENCOUNTER — Other Ambulatory Visit (HOSPITAL_COMMUNITY): Payer: Self-pay

## 2021-11-02 MED ORDER — LAMOTRIGINE 200 MG PO TABS
400.0000 mg | ORAL_TABLET | Freq: Every day | ORAL | 0 refills | Status: DC
  Filled 2021-11-02: qty 180, 90d supply, fill #0

## 2021-11-05 ENCOUNTER — Other Ambulatory Visit (HOSPITAL_COMMUNITY): Payer: Self-pay

## 2021-11-05 ENCOUNTER — Other Ambulatory Visit (INDEPENDENT_AMBULATORY_CARE_PROVIDER_SITE_OTHER): Payer: Self-pay | Admitting: Family Medicine

## 2021-11-05 DIAGNOSIS — E538 Deficiency of other specified B group vitamins: Secondary | ICD-10-CM

## 2021-11-05 DIAGNOSIS — E559 Vitamin D deficiency, unspecified: Secondary | ICD-10-CM

## 2021-11-06 ENCOUNTER — Other Ambulatory Visit: Payer: Self-pay | Admitting: *Deleted

## 2021-11-06 ENCOUNTER — Encounter: Payer: Self-pay | Admitting: Family Medicine

## 2021-11-06 ENCOUNTER — Other Ambulatory Visit (HOSPITAL_COMMUNITY): Payer: Self-pay

## 2021-11-06 DIAGNOSIS — E559 Vitamin D deficiency, unspecified: Secondary | ICD-10-CM

## 2021-11-06 MED ORDER — FOLIC ACID 1 MG PO TABS
1.0000 mg | ORAL_TABLET | Freq: Every day | ORAL | 0 refills | Status: DC
  Filled 2021-11-06: qty 90, 90d supply, fill #0

## 2021-11-06 MED ORDER — VITAMIN D (ERGOCALCIFEROL) 1.25 MG (50000 UNIT) PO CAPS
ORAL_CAPSULE | ORAL | 1 refills | Status: AC
  Filled 2021-11-06: qty 12, 84d supply, fill #0
  Filled 2022-01-25: qty 12, 84d supply, fill #1

## 2021-11-08 ENCOUNTER — Encounter: Payer: Self-pay | Admitting: Family Medicine

## 2021-11-08 DIAGNOSIS — F3181 Bipolar II disorder: Secondary | ICD-10-CM | POA: Diagnosis not present

## 2021-11-08 NOTE — Telephone Encounter (Signed)
Spoke with patient to follow up on depression and blood sugar readings. Patient stated her depression is getting better because she is currently seeing a psychiatrist and is on medication as well to help. Patient's last A1c was 3 months ago. Advised patient to schedule follow up appointment with Dr. Zigmund Daniel to discuss Diabetes medications. Patient agreed. Transferred patient up front to schedule appointment.  ?

## 2021-11-09 ENCOUNTER — Other Ambulatory Visit (HOSPITAL_COMMUNITY): Payer: Self-pay

## 2021-11-12 ENCOUNTER — Other Ambulatory Visit (HOSPITAL_COMMUNITY): Payer: Self-pay

## 2021-11-13 ENCOUNTER — Telehealth: Payer: Self-pay | Admitting: Adult Health

## 2021-11-13 ENCOUNTER — Encounter: Payer: Self-pay | Admitting: Cardiology

## 2021-11-13 DIAGNOSIS — F331 Major depressive disorder, recurrent, moderate: Secondary | ICD-10-CM | POA: Diagnosis not present

## 2021-11-13 NOTE — Telephone Encounter (Signed)
Can you give her a call for more specifics.

## 2021-11-13 NOTE — Telephone Encounter (Signed)
Patient Tracy [name] requesting an extension on her reduced work schedule. She stated she needs an extra week. Patient will check to see what additional information will be needed. Contact Wallace with any additional questions, she can be reached at # [phone]. ?

## 2021-11-13 NOTE — Telephone Encounter (Signed)
Agreed. Ty.  ?

## 2021-11-13 NOTE — Telephone Encounter (Signed)
Rtc to patient and she feels she is not ready for FT work yet, I agreed and discussed. She will continue Monday, Wednesday, Friday like she has been doing but extend that for 3 more weeks before going 5 days a week.  ? ?Informed her I would fax an updated letter to Lorin Mercy with [employer].  ?

## 2021-11-15 ENCOUNTER — Ambulatory Visit: Payer: 59 | Admitting: Cardiology

## 2021-11-15 NOTE — Telephone Encounter (Signed)
Letter has been typed up and faxed ?

## 2021-11-16 NOTE — Telephone Encounter (Signed)
Please see message. °

## 2021-11-16 NOTE — Telephone Encounter (Signed)
Pt LVM @ 12:44p.  She said the fax has not been received by [employer].  I called her back and told her it was late yesterday and she says she checked with them this morning.  Will you fax it again? ? ?No upcoming appt scheduled. ?

## 2021-11-16 NOTE — Telephone Encounter (Signed)
Misako called again at 1:35p.  She said she was talking to Verline Lema at [place].  And she suggested maybe the documents can be uploaded into my chart and then the pt can download them. ?

## 2021-11-20 DIAGNOSIS — F3181 Bipolar II disorder: Secondary | ICD-10-CM | POA: Diagnosis not present

## 2021-11-20 DIAGNOSIS — F331 Major depressive disorder, recurrent, moderate: Secondary | ICD-10-CM | POA: Diagnosis not present

## 2021-11-21 ENCOUNTER — Telehealth: Payer: Self-pay | Admitting: Adult Health

## 2021-11-21 NOTE — Telephone Encounter (Signed)
Received fax from [employer]. Completion needed for short term disability claim. Placed in [name]'s box. ?

## 2021-11-22 NOTE — Telephone Encounter (Signed)
Pt called at 1:00 pm and said that she needs a start date of 11/19/21 and end date 12/04/21.. She gave me a different fax number to make sure that they get it. They claim that they have not received anything from [place]. The name we need to send to is debbie shelton her fax number is [phone]. If you have copies of any other [place] paperwork please fax to the number I gave you and let me know so I can tell Jaylanni ?

## 2021-11-23 DIAGNOSIS — Z0289 Encounter for other administrative examinations: Secondary | ICD-10-CM

## 2021-11-27 ENCOUNTER — Other Ambulatory Visit: Payer: Self-pay | Admitting: Family Medicine

## 2021-11-27 ENCOUNTER — Other Ambulatory Visit (HOSPITAL_COMMUNITY): Payer: Self-pay

## 2021-11-27 DIAGNOSIS — E1165 Type 2 diabetes mellitus with hyperglycemia: Secondary | ICD-10-CM

## 2021-11-27 MED ORDER — SYNTHROID 300 MCG PO TABS
ORAL_TABLET | ORAL | 1 refills | Status: DC
  Filled 2021-11-27: qty 90, 90d supply, fill #0
  Filled 2022-04-01: qty 90, 90d supply, fill #1

## 2021-11-27 MED ORDER — RYBELSUS 14 MG PO TABS
14.0000 mg | ORAL_TABLET | Freq: Every day | ORAL | 1 refills | Status: DC
  Filled 2021-11-27: qty 90, 90d supply, fill #0

## 2021-11-27 MED ORDER — NYSTATIN 100000 UNIT/GM EX POWD
1.0000 "application " | Freq: Three times a day (TID) | CUTANEOUS | 3 refills | Status: DC
  Filled 2021-11-27: qty 60, 20d supply, fill #0
  Filled 2022-01-25: qty 60, 20d supply, fill #1
  Filled 2022-02-17: qty 60, 20d supply, fill #2

## 2021-11-27 NOTE — Telephone Encounter (Signed)
This has been completed.

## 2021-11-28 ENCOUNTER — Other Ambulatory Visit (HOSPITAL_COMMUNITY): Payer: Self-pay

## 2021-11-29 ENCOUNTER — Ambulatory Visit: Payer: 59 | Admitting: Family Medicine

## 2021-12-04 DIAGNOSIS — F331 Major depressive disorder, recurrent, moderate: Secondary | ICD-10-CM | POA: Diagnosis not present

## 2021-12-05 DIAGNOSIS — F3181 Bipolar II disorder: Secondary | ICD-10-CM | POA: Diagnosis not present

## 2021-12-06 DIAGNOSIS — G4733 Obstructive sleep apnea (adult) (pediatric): Secondary | ICD-10-CM | POA: Diagnosis not present

## 2021-12-11 DIAGNOSIS — F331 Major depressive disorder, recurrent, moderate: Secondary | ICD-10-CM | POA: Diagnosis not present

## 2021-12-18 DIAGNOSIS — F331 Major depressive disorder, recurrent, moderate: Secondary | ICD-10-CM | POA: Diagnosis not present

## 2021-12-25 DIAGNOSIS — F331 Major depressive disorder, recurrent, moderate: Secondary | ICD-10-CM | POA: Diagnosis not present

## 2021-12-26 ENCOUNTER — Other Ambulatory Visit: Payer: Self-pay | Admitting: Adult Health

## 2021-12-26 ENCOUNTER — Other Ambulatory Visit (HOSPITAL_COMMUNITY): Payer: Self-pay

## 2021-12-26 DIAGNOSIS — F410 Panic disorder [episodic paroxysmal anxiety] without agoraphobia: Secondary | ICD-10-CM

## 2021-12-27 ENCOUNTER — Telehealth (INDEPENDENT_AMBULATORY_CARE_PROVIDER_SITE_OTHER): Payer: 59 | Admitting: Adult Health

## 2021-12-27 ENCOUNTER — Other Ambulatory Visit (HOSPITAL_COMMUNITY): Payer: Self-pay

## 2021-12-27 ENCOUNTER — Encounter: Payer: Self-pay | Admitting: Adult Health

## 2021-12-27 DIAGNOSIS — F332 Major depressive disorder, recurrent severe without psychotic features: Secondary | ICD-10-CM

## 2021-12-27 DIAGNOSIS — F4310 Post-traumatic stress disorder, unspecified: Secondary | ICD-10-CM

## 2021-12-27 DIAGNOSIS — F603 Borderline personality disorder: Secondary | ICD-10-CM | POA: Diagnosis not present

## 2021-12-27 DIAGNOSIS — F411 Generalized anxiety disorder: Secondary | ICD-10-CM | POA: Diagnosis not present

## 2021-12-27 DIAGNOSIS — F320 Major depressive disorder, single episode, mild: Secondary | ICD-10-CM | POA: Diagnosis not present

## 2021-12-27 NOTE — Telephone Encounter (Signed)
Has appt with Gina today.  

## 2021-12-27 NOTE — Progress Notes (Signed)
ZEN FELLING [id number] 03-29-1966 56 y.o.  Subjective:   Patient ID:  Tracy [name] is a 56 y.o. (DOB Dec 09, 1965) female.  Chief Complaint: No chief complaint on file.   HPI Tracy [name] presents to the office today for follow-up of MDD, GAD, PTSD, and Panic attacks.  Describes mood today as "ok". Pleasant. Flat. Decreased tearfulness. Mood symptoms - reports some depression, anxiety and irritability. Reports some worry and rumination. Denies recent panic attacks. Reports mood instability - "mood changes quickly". Working with [place] workbooks. Plans to work with DBT therapist. Seeing therapist regularly - working on a behavior change. Stating "I'm working on me".  Improved interest and motivation. Taking medications as prescribed. Energy levels vary. Active, does not have a regular exercise routine.  Enjoys some usual interests and activities. Lives alone with 3 dogs. Recently divorced. Siblings local. Spending time with family. Appetite adequate. Weight stable - 300 pounds.  Sleeps well most nights - incorporating meditation. Averages 8 hours. Sleep apnea - taking Nuvigil in the morning.  Denies SI or HI. Denies AH or VH.  Restarted DBT therapy.  Previous medication trials:  Antipsychotics: Vraylar-TD, Latuda, Abilify, Geodon, Rexulti, Lybalvi, Risperdal  Mood Stabilizers - Depakote, Lithium, Lamictal, Equetro, Topamax, Trileptal, Gabapentin  SSRI - Zoloft, Lexapro, Celexa, Prozac, Viibryd, Trintellix, Prozac  SNRI - Pristiq, Effexor, Cymbalta  Wellbutrin - allergic reaction  Anti-anxiety - Buspar, Xanax, Clonazepam, Ativan, Valium  Sleep agents - Trazadone, Ambien, Restoril   Other - Deplin, Emsam, Serzone, Doxepin, Nuvigil, Vistaril  Previous treatment: ECT and [place]     GAD-7    Flowsheet Row Office Visit from 03/14/2021 in [place] Office Visit from 02/14/2021 in [facility] [facility] Office  Visit from 03/01/2020 in [facility]  Total GAD-7 Score '17 12 11      '$ PHQ2-9    [place] Office Visit from 03/14/2021 in [place] Office Visit from 02/14/2021 in [place] Office Visit from 10/11/2020 in [place] Office Visit from 03/01/2020 in [place] Visit from 06/16/2019 in [place] at [facility] Total Score '6 6 6 3 2  '$ PHQ-9 Total Score '19 21 19 9 9      '$ Flowsheet Row ED from 02/11/2021 in [facility] from 02/10/2021 in [facility] Admission (Discharged) from 01/10/2021 in [place] No Risk No Risk No Risk        Review of Systems:  Review of Systems  Musculoskeletal:  Negative for gait problem.  Neurological:  Negative for tremors.  Psychiatric/Behavioral:         Please refer to HPI   Medications: I have reviewed the patient's current medications.  Current Outpatient Medications  Medication Sig Dispense Refill  . albuterol (VENTOLIN HFA) 108 (90 Base) MCG/ACT inhaler Inhale 2 puffs into the lungs every 6 (six) hours as needed for wheezing or shortness of breath. 8 g 3  . Armodafinil 200 MG TABS Take 1 tablet by mouth as needed for sleepiness 30 tablet 2  . benzonatate (TESSALON PERLES) 100 MG capsule Take 1 capsule (100 mg total) by mouth 3 (three) times daily as needed. 20 capsule 0  . clonazePAM (KLONOPIN) 1 MG tablet Take 1 tablet by mouth 3 times daily. [place]  tablet 2  . Continuous Blood Gluc Sensor (DEXCOM G6 SENSOR) MISC Replace every 10 days.  Use to check blood sugar. 9 each 1  . Continuous Blood Gluc Transmit (DEXCOM G6 TRANSMITTER) MISC Use to check continuous blood sugar.  Replace every 90 days 1 each 3  . cyclobenzaprine (FLEXERIL) 10 MG tablet TAKE 1 TABLET  BY MOUTH 2 TIMES DAILY AS NEEDED FOR MUSCLE SPASMS 20 tablet 0  . Esketamine HCl, 56 MG Dose, (SPRAVATO, 56 MG DOSE,) 28 MG/DEVICE SOPK Administer 56 mg ( 2 of the 28 mg) intranasally as directed. (Patient not taking: Reported on 09/14/2021) 2 each 0  . ferrous sulfate 325 (65 FE) MG EC tablet Take 1 tablet (325 mg total) by mouth in the morning and at bedtime. Take iron supplement 2 hours before or 4 hours after PPI or other antacids as this requires some level of gastric acidity to aid in absorption 60 tablet 5  . folic acid (FOLVITE) 1 MG tablet Take 1 tablet by mouth daily. 90 tablet 0  . furosemide (LASIX) 40 MG tablet TAKE 1 TABLET BY MOUTH DAILY. 30 tablet 3  . glucose blood (FREESTYLE LITE) test strip Use as instructed 100 each 12  . HYDROcodone bit-homatropine (HYCODAN) 5-1.5 MG/5ML syrup Take 5 mLs by mouth every 8 hours as needed for cough. 120 mL 0  . hydrOXYzine (ATARAX) 25 MG tablet Take 1 tablet (25 mg total) by mouth 3 (three) times daily as needed for anxiety. 90 tablet 3  . insulin lispro (HUMALOG KWIKPEN) 100 UNIT/ML KwikPen Inject up to 16 units 3 times daily at mealtime per sliding scale. (Patient not taking: Reported on 09/14/2021) 15 mL 11  . Insulin Pen Needle (UNIFINE PENTIPS) 31G X 5 MM MISC Use as directed to inject insulin 100 each 3  . lamoTRIgine (LAMICTAL) 200 MG tablet Take 2 tablets by mouth at bedtime. 180 tablet 0  . lidocaine (XYLOCAINE) 5 % ointment Apply 1 application topically 3 (three) times daily as needed. 35.44 g 0  . lisinopril (ZESTRIL) 10 MG tablet Take 1 tablet (10 mg total) by mouth daily. 90 tablet 3  . meloxicam (MOBIC) 15 MG tablet Take one tablet by mouth every morning with a meal for 2 weeks, then daily as needed for pain. 30 tablet 0  . NUVIGIL 200 MG TABS Take 1 tablet by mouth daily as needed (sleepiness). 30 tablet 2  . nystatin (NYAMYC) powder Apply 3 times daily as directed. 60 g 3  . ondansetron (ZOFRAN-ODT) 4 MG disintegrating tablet Dissolve  1 tablet (4 mg total) by mouth 2 (two) times daily as needed for nausea or vomiting. 20 tablet 1  . pantoprazole (PROTONIX) 40 MG tablet Take 2 tablets (80 mg total) by mouth 2 (two) times daily. 60 tablet 3  . predniSONE (DELTASONE) 50 MG tablet Take 1 tablet  by mouth daily. 5 tablet 0  . risperiDONE (RISPERDAL) 1 MG tablet Take 1 tablet (1 mg total) by mouth at bedtime. 90 tablet 1  . rizatriptan (MAXALT) 10 MG tablet Take 1 tablet (10 mg total) by mouth as needed for migraine. May repeat in 2 hours if needed (Patient taking differently: Take 10 mg by mouth every 2 (two) hours as needed for migraine. May repeat in 2 hours if needed) 10 tablet 0  . rosuvastatin (CRESTOR) 20 MG tablet TAKE 1 TABLET BY MOUTH DAILY. 90 tablet 3  . Semaglutide (RYBELSUS) 14 MG TABS Take 1 tablet by mouth daily. 90 tablet 1  .  sertraline (ZOLOFT) 100 MG tablet TAKE 2 TABLETS BY MOUTH EVERY MORNING 180 tablet 3  . spironolactone (ALDACTONE) 50 MG tablet Take 1 tablet by mouth 2 times daily. 180 tablet 2  . sucralfate (CARAFATE) 1 g tablet Take 1 g by mouth 4 (four) times daily. (Patient not taking: Reported on 09/14/2021)    . SYNTHROID 300 MCG tablet TAKE 1 TABLET BY MOUTH ONCE A DAY BEFORE BREAKFAST 90 tablet 1  . Vitamin D, Ergocalciferol, (DRISDOL) 1.25 MG (50000 UNIT) CAPS capsule TAKE 1 CAPSULE BY MOUTH EVERY 7 DAYS. 12 capsule 1   No current facility-administered medications for this visit.    Medication Side Effects: None  Allergies:  Allergies  Allergen Reactions  . Sulfa Antibiotics Itching, Swelling and Rash  . Food     Walnut-mouth swelling/itching  . Bupropion Hcl Itching and Rash  . Codeine Itching  . Sulfamethoxazole-Trimethoprim Rash    Past Medical History:  Diagnosis Date  . Acanthosis nigricans, acquired 02/09/2015  . Anxiety and depression 10/31/2014  . Arthritis    back, knees, right elbow  . Arthritis of left hip 06/25/2018  . Bilateral swelling of feet and ankles   . Bipolar  disorder ([place])   . Borderline personality disorder ([place])   . 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 Carpal tunnel syndrome 09/06/2015  . Chewing difficulty   . Chronic left shoulder pain 05/17/2020  . Constipation   . Coronary artery disease 03/21/2021  . COVID 10/08/2021  . Cystic teratoma    BENIGN  . Dental crowns present   . Diabetes mellitus without complication ([place])   . Diabetes mellitus, type 2 ([place]) 06/10/2013  . Diarrhea   . Diverticulosis of colon without hemorrhage   . DOE (dyspnea on exertion) 03/21/2021  . Dyspepsia 12/14/2020  . Essential hypertension 10/02/2007   Qualifier: Diagnosis of  By: Loanne Drilling MD, Jacelyn Pi   . Facial rash 06/16/2019  . Female hirsutism 02/09/2015  . Food allergy    Walnuts  . GAD (generalized anxiety disorder)   . Gastritis and gastroduodenitis   . Gastroesophageal reflux disease   . Grade II internal hemorrhoids   . Greater trochanteric bursitis of left hip 07/24/2018  . Hemorrhoids, internal 07/18/2017  . History of kidney stones   . History of migraine headaches   . History of posttraumatic stress disorder (PTSD)   . 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 Hypothyroidism   . HYPOTHYROIDISM, POSTSURGICAL 10/02/2007   Qualifier: Diagnosis of  By: Loanne Drilling MD, Sean A   . IBS (irritable bowel syndrome)   . Infertility associated with anovulation 02/09/2015  . Insulin resistance 02/09/2015  . Joint pain   . Leg swelling 09/21/2020  . Lichen planus   . Liver cirrhosis secondary to NASH (nonalcoholic steatohepatitis) ([place])   . Lower back pain 07/18/2017  . MDD (major depressive disorder) 06/06/2015  . Migraine 10/02/2007  . Morbid obesity with BMI of 40.0-44.9, adult ([place]) 06/23/2020  . NASH (nonalcoholic steatohepatitis)   . Nausea without vomiting   . Nonalcoholic fatty liver disease 02/09/2015  . Obstructive sleep apnea 06/27/2014  . Oral mucosal lesion 06/17/2019  . Osteoarthritis   . Other fatigue   . PCOS (polycystic ovarian syndrome)   . 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 POLYCYSTIC OVARIAN DISEASE 10/02/2007   Qualifier: Diagnosis of  By: Loanne Drilling MD, Jacelyn Pi   . PONV (postoperative nausea and vomiting)    also hx. of emergence delirium 2007  . Primary osteoarthritis of left knee 01/21/2018  .  RENAL CALCULUS, HX OF 10/02/2007   Qualifier: Diagnosis of  By: Marca Ancona RMA, Lucy    . Severe episode of recurrent major depressive disorder, without psychotic features ([place])   . Shortness of breath on exertion   . 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 TOBACCO USE, QUIT 02/08/2010   Qualifier: Diagnosis of  By: Loanne Drilling MD, Jacelyn Pi   . 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 Trigeminal neuralgia   . Unilateral primary osteoarthritis, left hip 02/10/2019  . Vitamin D deficiency     Past Medical History, Surgical history, Social history, and Family history were reviewed and updated as appropriate.   Please see review of systems for further details on the patient's review from today.   Objective:   Physical Exam:  LMP 08/24/2020   Physical Exam Constitutional:      General: She is not in acute distress. Musculoskeletal:        General: No deformity.  Neurological:     Mental Status: She is alert and oriented to person, place, and time.     Coordination: Coordination normal.  Psychiatric:        Attention and Perception: Attention and perception normal. She does not perceive auditory or visual hallucinations.        Mood and Affect: Mood normal. Mood is not anxious or depressed. Affect is not labile, blunt, angry or inappropriate.        Speech: Speech normal.        Behavior: Behavior normal.        Thought Content: Thought content normal. Thought content is not paranoid or delusional. Thought content does not include homicidal or suicidal ideation. Thought content does not include homicidal or suicidal plan.        Cognition and Memory: Cognition and memory normal.        Judgment:  Judgment normal.     Comments: Insight intact    Lab Review:     Component Value Date/Time   NA 137 08/02/2021 0000   NA 140 10/11/2020 1041   K 4.4 08/02/2021 0000   CL 104 08/02/2021 0000   CO2 24 08/02/2021 0000   GLUCOSE 153 (H) 08/02/2021 0000   BUN 9 08/02/2021 0000   BUN 14 10/11/2020 1041   CREATININE 0.95 08/02/2021 0000   CALCIUM 9.4 08/02/2021 0000   PROT 7.2 08/02/2021 0000   PROT 7.1 10/11/2020 1041   ALBUMIN 3.8 02/11/2021 1516   ALBUMIN 4.2 10/11/2020 1041   AST 26 08/02/2021 0000   ALT 26 08/02/2021 0000   ALKPHOS 172 (H) 02/11/2021 1516   BILITOT 0.3 08/02/2021 0000   BILITOT <0.2 10/11/2020 1041   GFRNONAA >60 02/11/2021 1516   GFRNONAA 99 12/26/2020 0000   GFRAA 115 12/26/2020 0000       Component Value Date/Time   WBC 4.4 02/14/2021 0000   RBC 4.67 02/14/2021 0000   HGB 11.9 02/14/2021 0000   HGB 10.9 (L) 10/11/2020 1041   HCT 38.4 02/14/2021 0000   HCT 35.6 10/11/2020 1041   PLT 171 02/14/2021 0000   PLT 224 10/11/2020 1041   MCV 82.2 02/14/2021 0000   MCV 78 (L) 10/11/2020 1041   MCH 25.5 (L) 02/14/2021 0000   MCHC 31.0 (L) 02/14/2021 0000   RDW 20.0 (H) 02/14/2021 0000   RDW 17.5 (H) 10/11/2020 1041   LYMPHSABS 1,448 02/14/2021 0000   LYMPHSABS 1.7 10/11/2020 1041   MONOABS  0.3 02/11/2021 1516   EOSABS 141 02/14/2021 0000   EOSABS 0.2 10/11/2020 1041   BASOSABS 22 02/14/2021 0000   BASOSABS 0.0 10/11/2020 1041    No results found for: POCLITH, LITHIUM   No results found for: PHENYTOIN, PHENOBARB, VALPROATE, CBMZ   .res Assessment: Plan:    Plan:  PDMP reviewed  Risperdal '1mg'$  at hs  Lamictal '200mg'$  daily Zoloft '100mg'$  - 2 daily Nuvigil '200mg'$  daily - take every day Clonazepam '1mg'$  Hydroxyzine '25mg'$  TID - using 1 tablet at bedtime.   Discussed filling out ADA paperwork  Time spent with patient was 25 minutes. Greater than 50% of face to face time with patient was spent on counseling and coordination of care.      [employer] - therapist  RTC 4 weeks  Patient advised to contact office with any questions, adverse effects, or acute worsening in signs and symptoms.  Counseled patient regarding potential benefits, risks, and side effects of Lamictal to include potential risk of Stevens-Johnson syndrome. Advised patient to stop taking Lamictal and contact office immediately if rash develops and to seek urgent medical attention if rash is severe and/or spreading quickly.   Discussed potential benefits, risk, and side effects of benzodiazepines to include potential risk of tolerance and dependence, as well as possible drowsiness. Advised patient not to drive if experiencing drowsiness and to take lowest possible effective dose to minimize risk of dependence and tolerance.  Discussed potential metabolic side effects associated with atypical antipsychotics, as well as potential risk for movement side effects. Advised pt to contact office if movement side effects occur.   Diagnoses and all orders for this visit:  Borderline personality disorder ([place])  Severe episode of recurrent major depressive disorder, without psychotic features ([place])  Current mild episode of major depressive disorder without prior episode ([place])  GAD (generalized anxiety disorder)  PTSD (post-traumatic stress disorder)     Please see After Visit Summary for patient specific instructions.  No future appointments.  No orders of the defined types were placed in this encounter.   -------------------------------

## 2021-12-28 ENCOUNTER — Other Ambulatory Visit (HOSPITAL_COMMUNITY): Payer: Self-pay

## 2021-12-28 MED ORDER — CLONAZEPAM 1 MG PO TABS
1.0000 mg | ORAL_TABLET | Freq: Three times a day (TID) | ORAL | 2 refills | Status: DC
  Filled 2021-12-28: qty 90, 30d supply, fill #0
  Filled 2022-01-25: qty 90, 30d supply, fill #1
  Filled 2022-04-25: qty 90, 30d supply, fill #2

## 2021-12-29 ENCOUNTER — Other Ambulatory Visit (INDEPENDENT_AMBULATORY_CARE_PROVIDER_SITE_OTHER): Payer: Self-pay | Admitting: Family Medicine

## 2021-12-29 DIAGNOSIS — E538 Deficiency of other specified B group vitamins: Secondary | ICD-10-CM

## 2021-12-30 ENCOUNTER — Telehealth: Payer: 59 | Admitting: Family

## 2021-12-30 ENCOUNTER — Encounter (HOSPITAL_BASED_OUTPATIENT_CLINIC_OR_DEPARTMENT_OTHER): Payer: Self-pay | Admitting: Obstetrics and Gynecology

## 2021-12-30 ENCOUNTER — Other Ambulatory Visit: Payer: Self-pay

## 2021-12-30 ENCOUNTER — Telehealth: Payer: 59

## 2021-12-30 ENCOUNTER — Emergency Department (HOSPITAL_BASED_OUTPATIENT_CLINIC_OR_DEPARTMENT_OTHER)
Admission: EM | Admit: 2021-12-30 | Discharge: 2021-12-30 | Disposition: A | Discharge status: Home / Self Care | Payer: 59 | Attending: Emergency Medicine | Admitting: Emergency Medicine

## 2021-12-30 DIAGNOSIS — Z794 Long term (current) use of insulin: Secondary | ICD-10-CM | POA: Insufficient documentation

## 2021-12-30 DIAGNOSIS — Z96642 Presence of left artificial hip joint: Secondary | ICD-10-CM | POA: Insufficient documentation

## 2021-12-30 DIAGNOSIS — I2510 Atherosclerotic heart disease of native coronary artery without angina pectoris: Secondary | ICD-10-CM | POA: Insufficient documentation

## 2021-12-30 DIAGNOSIS — R519 Headache, unspecified: Secondary | ICD-10-CM | POA: Insufficient documentation

## 2021-12-30 DIAGNOSIS — E039 Hypothyroidism, unspecified: Secondary | ICD-10-CM | POA: Diagnosis not present

## 2021-12-30 DIAGNOSIS — R7309 Other abnormal glucose: Secondary | ICD-10-CM

## 2021-12-30 DIAGNOSIS — E1165 Type 2 diabetes mellitus with hyperglycemia: Secondary | ICD-10-CM | POA: Insufficient documentation

## 2021-12-30 DIAGNOSIS — M791 Myalgia, unspecified site: Secondary | ICD-10-CM

## 2021-12-30 DIAGNOSIS — Z85819 Personal history of malignant neoplasm of unspecified site of lip, oral cavity, and pharynx: Secondary | ICD-10-CM | POA: Insufficient documentation

## 2021-12-30 DIAGNOSIS — I10 Essential (primary) hypertension: Secondary | ICD-10-CM | POA: Insufficient documentation

## 2021-12-30 DIAGNOSIS — R739 Hyperglycemia, unspecified: Secondary | ICD-10-CM

## 2021-12-30 DIAGNOSIS — Z8616 Personal history of COVID-19: Secondary | ICD-10-CM | POA: Insufficient documentation

## 2021-12-30 DIAGNOSIS — E1169 Type 2 diabetes mellitus with other specified complication: Secondary | ICD-10-CM | POA: Diagnosis not present

## 2021-12-30 DIAGNOSIS — Z79899 Other long term (current) drug therapy: Secondary | ICD-10-CM | POA: Diagnosis not present

## 2021-12-30 DIAGNOSIS — Z87891 Personal history of nicotine dependence: Secondary | ICD-10-CM | POA: Insufficient documentation

## 2021-12-30 LAB — BASIC METABOLIC PANEL
Anion gap: 15 (ref 5–15)
BUN: 17 mg/dL (ref 6–20)
CO2: 20 mmol/L — ABNORMAL LOW (ref 22–32)
Calcium: 9.5 mg/dL (ref 8.9–10.3)
Chloride: 95 mmol/L — ABNORMAL LOW (ref 98–111)
Creatinine, Ser: 0.86 mg/dL (ref 0.44–1.00)
GFR, Estimated: >60 mL/min (ref >60)
Glucose, Bld: 401 mg/dL — ABNORMAL HIGH (ref 70–99)
Potassium: 4.2 mmol/L (ref 3.5–5.1)
Sodium: 130 mmol/L — ABNORMAL LOW (ref 135–145)

## 2021-12-30 LAB — CBC
HCT: 41.8 % (ref 36.0–46.0)
Hemoglobin: 14.4 g/dL (ref 12.0–15.0)
MCH: 30 pg (ref 26.0–34.0)
MCHC: 34.4 g/dL (ref 30.0–36.0)
MCV: 87.1 fL (ref 80.0–100.0)
Platelets: 133 10*3/uL — ABNORMAL LOW (ref 150–400)
RBC: 4.8 MIL/uL (ref 3.87–5.11)
RDW: 13.9 % (ref 11.5–15.5)
WBC: 3.7 10*3/uL — ABNORMAL LOW (ref 4.0–10.5)
nRBC: 0 % (ref 0.0–0.2)

## 2021-12-30 LAB — URINALYSIS, ROUTINE W REFLEX MICROSCOPIC
Bilirubin Urine: NEGATIVE
Glucose, UA: >=500 mg/dL — AB
Hgb urine dipstick: NEGATIVE
Ketones, ur: NEGATIVE mg/dL
Leukocytes,Ua: NEGATIVE
Nitrite: NEGATIVE
Protein, ur: NEGATIVE mg/dL
Specific Gravity, Urine: 1.02 (ref 1.005–1.030)
pH: 5.5 (ref 5.0–8.0)

## 2021-12-30 LAB — URINALYSIS, MICROSCOPIC (REFLEX)

## 2021-12-30 LAB — I-STAT VENOUS BLOOD GAS, ED
Acid-Base Excess: 1 mmol/L (ref 0.0–2.0)
Bicarbonate: 24.8 mmol/L (ref 20.0–28.0)
Calcium, Ion: 1.16 mmol/L (ref 1.15–1.40)
HCT: 41 % (ref 36.0–46.0)
Hemoglobin: 13.9 g/dL (ref 12.0–15.0)
O2 Saturation: 89 %
Potassium: 4.2 mmol/L (ref 3.5–5.1)
Sodium: 131 mmol/L — ABNORMAL LOW (ref 135–145)
TCO2: 26 mmol/L (ref 22–32)
pCO2, Ven: 36.9 mmHg — ABNORMAL LOW (ref 44–60)
pH, Ven: 7.436 — ABNORMAL HIGH (ref 7.25–7.43)
pO2, Ven: 54 mmHg — ABNORMAL HIGH (ref 32–45)

## 2021-12-30 LAB — CBG MONITORING, ED
Glucose-Capillary: 392 mg/dL — ABNORMAL HIGH (ref 70–99)
Glucose-Capillary: 351 mg/dL — ABNORMAL HIGH (ref 70–99)

## 2021-12-30 LAB — OSMOLALITY: Osmolality: 295 mOsm/kg (ref 275–295)

## 2021-12-30 LAB — PREGNANCY, URINE: Preg Test, Ur: NEGATIVE

## 2021-12-30 MED ORDER — LIDOCAINE 5 % EX PTCH
1.0000 | MEDICATED_PATCH | CUTANEOUS | Status: DC
  Administered 2021-12-30: 1 via TRANSDERMAL
  Filled 2021-12-30: qty 1

## 2021-12-30 MED ORDER — BUTALBITAL-APAP-CAFFEINE 50-325-40 MG PO TABS
1.0000 | ORAL_TABLET | Freq: Four times a day (QID) | ORAL | 0 refills | Status: DC | PRN
  Filled 2021-12-30: qty 20, 3d supply, fill #0

## 2021-12-30 MED ORDER — METHOCARBAMOL 500 MG PO TABS: 1000.0000 mg | ORAL_TABLET | Freq: Two times a day (BID) | ORAL | 0 refills | Status: AC

## 2021-12-30 MED ORDER — SODIUM CHLORIDE 1 G PO TABS: 1.0000 g | ORAL_TABLET | Freq: Every day | ORAL | 0 refills | Status: DC

## 2021-12-30 MED ORDER — METOCLOPRAMIDE HCL 5 MG/ML IJ SOLN
5.0000 mg | Freq: Once | INTRAMUSCULAR | Status: AC
  Administered 2021-12-30: 5 mg via INTRAVENOUS
  Filled 2021-12-30: qty 2

## 2021-12-30 MED ORDER — LACTATED RINGERS IV BOLUS
1000.0000 mL | Freq: Once | INTRAVENOUS | Status: AC
Start: 2021-12-30 — End: 2021-12-30
  Administered 2021-12-30: 1000 mL via INTRAVENOUS

## 2021-12-30 MED ORDER — BUTALBITAL-APAP-CAFFEINE 50-325-40 MG PO TABS: 1.0000 | ORAL_TABLET | Freq: Four times a day (QID) | ORAL | 0 refills | Status: DC | PRN

## 2021-12-30 MED ORDER — DIPHENHYDRAMINE HCL 50 MG/ML IJ SOLN
25.0000 mg | Freq: Once | INTRAMUSCULAR | Status: AC
  Administered 2021-12-30: 25 mg via INTRAVENOUS
  Filled 2021-12-30: qty 1

## 2021-12-30 MED ORDER — MAGNESIUM SULFATE 2 GM/50ML IV SOLN
2.0000 g | Freq: Once | INTRAVENOUS | Status: AC
  Administered 2021-12-30: 2 g via INTRAVENOUS
  Filled 2021-12-30: qty 50

## 2021-12-30 NOTE — Discharge Instructions (Addendum)
It was a pleasure caring for you today in the emergency department. ° °Please return to the emergency department for any worsening or worrisome symptoms. ° ° °

## 2021-12-30 NOTE — ED Triage Notes (Signed)
Patient reports to the ER for Hyperglycemia. Patient reports she started having cramps in her fingers, feeling sick, and a pounding headache and then started feeling thirsty. Patient reports her CBG at home was over 400 last night.

## 2021-12-30 NOTE — ED Provider Notes (Signed)
[facility] Provider Note   CSN: [id number] Arrival date & time: 12/30/21  1047     History  Chief Complaint  Patient presents with   Hyperglycemia    Tracy [name] is a 56 y.o. female.  Patient as above with significant medical history as below, including anxiety, bipolar, borderline, CAD, migraine who presents to the ED with complaint of malaise, muscle cramps, headache. Symptom onset around 1 wk ago. She has been under increased stress over the last week, neglecting her self care, not checking her glucose. Headache over last 2 days; similar to prior migraine. Unrelieved with tylenol/motrin/excedrin. Throbbing/pulsing sensation to top of head. Photophobia. No phonophobia. No fever. She checked her glucose and it was over 300. She has some polyuria and polydipsia but no dysuria or hematuria. No neck pain.  She had a televisit with NP this morning and was advised to go to the ER to evaluate for DKA.     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   Carpal tunnel syndrome 09/06/2015   Chewing difficulty    Chronic left shoulder pain 05/17/2020   Constipation    Coronary artery disease 03/21/2021   COVID 10/08/2021   Cystic teratoma    BENIGN   Dental crowns present    Diabetes mellitus without complication ([place])    Diabetes mellitus, type 2 ([place]) 06/10/2013   Diarrhea    Diverticulosis of colon without hemorrhage    DOE (dyspnea on exertion) 03/21/2021   Dyspepsia 12/14/2020   Essential hypertension 10/02/2007   Qualifier: Diagnosis of  By: Loanne Drilling MD, Hilliard Clark A    Facial rash 06/16/2019   Female hirsutism 02/09/2015   Food allergy    Walnuts   GAD (generalized anxiety  disorder)    Gastritis and gastroduodenitis    Gastroesophageal reflux disease    Grade II internal hemorrhoids    Greater trochanteric bursitis of left hip 07/24/2018   Hemorrhoids, internal 07/18/2017   History of kidney stones    History of migraine headaches    History of posttraumatic stress disorder (PTSD)    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/2015   Hypothyroidism    HYPOTHYROIDISM, POSTSURGICAL 10/02/2007   Qualifier: Diagnosis of  By: Loanne Drilling MD, Sean A    IBS (irritable bowel syndrome)    Infertility associated with anovulation 02/09/2015   Insulin resistance 02/09/2015   Joint pain    Leg swelling 5/63/8756   Lichen planus    Liver cirrhosis secondary to NASH (nonalcoholic steatohepatitis) ([place])    Lower back pain 07/18/2017   MDD (major depressive disorder) 06/06/2015   Migraine 10/02/2007   Morbid obesity with BMI of 40.0-44.9, adult ([place]) 06/23/2020   NASH (nonalcoholic steatohepatitis)    Nausea without vomiting    Nonalcoholic fatty liver disease 02/09/2015   Obstructive sleep apnea 06/27/2014   Oral mucosal lesion 06/17/2019   Osteoarthritis    Other fatigue    PCOS (polycystic ovarian syndrome)    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[place]) 01/01/2012   POLYCYSTIC OVARIAN DISEASE 10/02/2007   Qualifier: Diagnosis of  By: Loanne Drilling MD, Hilliard Clark A    PONV (postoperative nausea and vomiting)    also hx. 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   Qualifier: Diagnosis of  By: Loanne Drilling MD, Sean A    Trigeminal neuralgia    Unilateral primary osteoarthritis, left hip 02/10/2019   Vitamin D  deficiency     Past Surgical History:  Procedure Laterality Date   ACHILLES TENDON SURGERY Left 2007   APPENDECTOMY     CARPAL TUNNEL RELEASE Right 01/12/2016   Procedure: RIGHT CARPAL TUNNEL RELEASE;  Surgeon: Roseanne Kaufman, MD;  Location: [facility];  Service: Orthopedics;  Laterality: Right;   COLONOSCOPY WITH PROPOFOL N/A 04/20/2014   Procedure: COLONOSCOPY WITH PROPOFOL;  Surgeon: Arta Silence, MD;  Location: [facility];  Service: Endoscopy;  Laterality: N/A;   COLONOSCOPY WITH PROPOFOL N/A 01/10/2021   Procedure: COLONOSCOPY WITH PROPOFOL;  Surgeon: Lavena Bullion, DO;  Location: [facility];  Service: Gastroenterology;  Laterality: N/A;   ESOPHAGOGASTRODUODENOSCOPY (EGD) WITH PROPOFOL N/A 04/20/2014   Procedure: ESOPHAGOGASTRODUODENOSCOPY (EGD) WITH PROPOFOL;  Surgeon: Arta Silence, MD;  Location[facility];  Service: Endoscopy;  Laterality: N/A;   ESOPHAGOGASTRODUODENOSCOPY (EGD) WITH PROPOFOL N/A 01/10/2021   Procedure: ESOPHAGOGASTRODUODENOSCOPY (EGD) WITH PROPOFOL;  Surgeon: Lavena Bullion, DO;  Location: [facility];  Service: Gastroenterology;  Laterality: N/A;   EXCISION HAGLUND'S DEFORMITY WITH ACHILLES TENDON REPAIR Right 02/25/2013   Procedure: RIGHT ACHILLES DEBRIDEMENT AND RECONSTRUCTION;  HAGLUND'S EXCISION; GASTROC RECESSION AND FLEXOR HALLUCIS LONGUS TRANSFER;  Surgeon: Wylene Simmer, MD;  Location: [facility];  Service: Orthopedics;  Laterality: Right;   JOINT REPLACEMENT     hip   LUMBAR LAMINECTOMY     X 3   neck fusion     OVARIAN CYST SURGERY Left 1998   PILONIDAL CYST EXCISION  1994   RIGHT OOPHORECTOMY     STERIOD INJECTION Left 01/12/2016   Procedure: STEROID INJECTION LEFT WRIST;  Surgeon: Roseanne Kaufman, MD;  Location: [facility];  Service: Orthopedics;  Laterality: Left;   TOTAL HIP ARTHROPLASTY Left 03/19/2019   Procedure: LEFT TOTAL HIP ARTHROPLASTY ANTERIOR APPROACH;  Surgeon: Mcarthur Rossetti,  MD;  Location: [facility];  Service: Orthopedics;  Laterality: Left;   TOTAL THYROIDECTOMY  2003   UPPER GI ENDOSCOPY  01/19/2015     The history is provided by the patient. No language interpreter was used.  Hyperglycemia Associated symptoms: increased thirst and polyuria   Associated symptoms: no abdominal pain, no chest pain, no confusion, no fever, no nausea, no shortness of breath and no vomiting       Home Medications Prior to Admission medications   Medication Sig Start Date End Date Taking? Authorizing Provider  methocarbamol (ROBAXIN) 500 MG tablet Take 2 tablets (1,000 mg total) by mouth 2 (two) times daily for 5 days. 12/30/21 01/04/22 Yes Wynona Dove A, DO  albuterol (VENTOLIN HFA) 108 (90 Base) MCG/ACT inhaler Inhale 2 puffs into the lungs every 6 (six) hours as needed for wheezing or shortness of breath. 10/18/21   Luetta Nutting, DO  Armodafinil 200 MG TABS Take 1 tablet by mouth as needed for sleepiness 08/01/21  Mozingo, Berdie Ogren, NP  benzonatate (TESSALON PERLES) 100 MG capsule Take 1 capsule (100 mg total) by mouth 3 (three) times daily as needed. 10/07/21   Sharion Balloon, FNP  butalbital-acetaminophen-caffeine (FIORICET) [phone] MG tablet Take 1-2 tablets by mouth every 6 (six) hours as needed for headache. 12/30/21 12/30/22  Jeanell Sparrow, DO  clonazePAM (KLONOPIN) 1 MG tablet Take 1 tablet by mouth 3 times daily. 12/28/21   Mozingo, Berdie Ogren, NP  Continuous Blood Gluc Sensor (DEXCOM G6 SENSOR) MISC Replace every 10 days.  Use to check blood sugar. 03/14/21   Luetta Nutting, DO  Continuous Blood Gluc Transmit (DEXCOM G6 TRANSMITTER) MISC Use to check continuous blood sugar.  Replace every 90 days 03/14/21   Luetta Nutting, DO  Esketamine HCl, 56 MG Dose, (SPRAVATO, 56 MG DOSE,) 28 MG/DEVICE SOPK Administer 56 mg ( 2 of the 28 mg) intranasally as directed. Patient not taking: Reported on 09/14/2021 07/02/21   Mozingo, Berdie Ogren, NP  ferrous sulfate 325 (65 FE)  MG EC tablet Take 1 tablet (325 mg total) by mouth in the morning and at bedtime. Take iron supplement 2 hours before or 4 hours after PPI or other antacids as this requires some level of gastric acidity to aid in absorption 12/29/20   Cirigliano, Vito V, DO  folic acid (FOLVITE) 1 MG tablet Take 1 tablet by mouth daily. 11/06/21   Luetta Nutting, DO  furosemide (LASIX) 40 MG tablet TAKE 1 TABLET BY MOUTH DAILY. 10/11/20 10/11/21  Luetta Nutting, DO  glucose blood (FREESTYLE LITE) test strip Use as instructed 02/11/21   Couture, Cortni S, PA-C  HYDROcodone bit-homatropine (HYCODAN) 5-1.5 MG/5ML syrup Take 5 mLs by mouth every 8 hours as needed for cough. 10/08/21   Silverio Decamp, MD  hydrOXYzine (ATARAX) 25 MG tablet Take 1 tablet (25 mg total) by mouth 3 (three) times daily as needed for anxiety. 10/09/21   Luetta Nutting, DO  insulin lispro (HUMALOG KWIKPEN) 100 UNIT/ML KwikPen Inject up to 16 units 3 times daily at mealtime per sliding scale. Patient not taking: Reported on 09/14/2021 02/15/21   Luetta Nutting, DO  Insulin Pen Needle (UNIFINE PENTIPS) 31G X 5 MM MISC Use as directed to inject insulin 12/29/20   Luetta Nutting, DO  lamoTRIgine (LAMICTAL) 200 MG tablet Take 2 tablets by mouth at bedtime. 11/02/21   Mozingo, Berdie Ogren, NP  lidocaine (XYLOCAINE) 5 % ointment Apply 1 application topically 3 (three) times daily as needed. 10/03/21   Luetta Nutting, DO  lisinopril (ZESTRIL) 10 MG tablet Take 1 tablet (10 mg total) by mouth daily. 04/25/21   Luetta Nutting, DO  meloxicam (MOBIC) 15 MG tablet Take one tablet by mouth every morning with a meal for 2 weeks, then daily as needed for pain. 10/31/21   Luetta Nutting, DO  NUVIGIL 200 MG TABS Take 1 tablet by mouth daily as needed (sleepiness). 05/17/21   Mozingo, Berdie Ogren, NP  nystatin [facility]) powder Apply 3 times daily as directed. 11/27/21   Luetta Nutting, DO  ondansetron (ZOFRAN-ODT) 4 MG disintegrating tablet Dissolve 1 tablet (4 mg total)  by mouth 2 (two) times daily as needed for nausea or vomiting. 08/30/21   Luetta Nutting, DO  pantoprazole (PROTONIX) 40 MG tablet Take 2 tablets (80 mg total) by mouth 2 (two) times daily. 08/24/21   Luetta Nutting, DO  predniSONE (DELTASONE) 50 MG tablet Take 1 tablet  by mouth daily. 10/08/21   Silverio Decamp, MD  risperiDONE (RISPERDAL) 1  MG tablet Take 1 tablet (1 mg total) by mouth at bedtime. 08/23/21   Mozingo, Berdie Ogren, NP  rizatriptan (MAXALT) 10 MG tablet Take 1 tablet (10 mg total) by mouth as needed for migraine. May repeat in 2 hours if needed Patient taking differently: Take 10 mg by mouth every 2 (two) hours as needed for migraine. May repeat in 2 hours if needed 12/23/17   Binnie Rail, MD  rosuvastatin (CRESTOR) 20 MG tablet TAKE 1 TABLET BY MOUTH DAILY. 04/25/21 04/25/22  Luetta Nutting, DO  Semaglutide (RYBELSUS) 14 MG TABS Take 1 tablet by mouth daily. 11/27/21   Luetta Nutting, DO  sertraline (ZOLOFT) 100 MG tablet TAKE 2 TABLETS BY MOUTH EVERY MORNING 10/01/21 10/01/22  Mozingo, Berdie Ogren, NP  spironolactone (ALDACTONE) 50 MG tablet Take 1 tablet by mouth 2 times daily. 10/09/21   Luetta Nutting, DO  sucralfate (CARAFATE) 1 g tablet Take 1 g by mouth 4 (four) times daily. Patient not taking: Reported on 09/14/2021    [provider]  SYNTHROID 300 MCG tablet TAKE 1 TABLET BY MOUTH ONCE A DAY BEFORE BREAKFAST 11/27/21 11/27/22  Luetta Nutting, DO  Vitamin D, Ergocalciferol, (DRISDOL) 1.25 MG (50000 UNIT) CAPS capsule TAKE 1 CAPSULE BY MOUTH EVERY 7 DAYS. 11/06/21 11/06/22  Luetta Nutting, DO      Allergies    Sulfa antibiotics, Food, Bupropion hcl, Codeine, and Sulfamethoxazole-trimethoprim    Review of Systems   Review of Systems  Constitutional:  Negative for chills and fever.  HENT:  Negative for facial swelling and trouble swallowing.   Eyes:  Negative for photophobia and visual disturbance.  Respiratory:  Negative for cough and shortness of breath.    Cardiovascular:  Negative for chest pain and palpitations.  Gastrointestinal:  Negative for abdominal pain, nausea and vomiting.  Endocrine: Positive for polydipsia and polyuria.  Genitourinary:  Negative for difficulty urinating and hematuria.  Musculoskeletal:  Positive for arthralgias. Negative for gait problem and joint swelling.  Skin:  Negative for pallor and rash.  Neurological:  Positive for headaches. Negative for syncope.  Psychiatric/Behavioral:  Negative for agitation and confusion.    Physical Exam Updated Vital Signs BP 115/65   Pulse 78   Temp 98.5 F (36.9 C) (Oral)   Resp 17   Ht '5\' 11"'$  (1.803 m)   Wt (!) 138.3 kg   LMP 08/24/2020   SpO2 95%   BMI 42.54 kg/m  Physical Exam Vitals and nursing note reviewed.  Constitutional:      General: She is not in acute distress.    Appearance: Normal appearance.  HENT:     Head: Normocephalic and atraumatic.     Right Ear: External ear normal.     Left Ear: External ear normal.     Nose: Nose normal.     Mouth/Throat:     Mouth: Mucous membranes are moist.  Eyes:     General: No scleral icterus.       Right eye: No discharge.        Left eye: No discharge.     Extraocular Movements: Extraocular movements intact.     Pupils: Pupils are equal, round, and reactive to light.  Cardiovascular:     Rate and Rhythm: Normal rate and regular rhythm.     Pulses: Normal pulses.     Heart sounds: Normal heart sounds.  Pulmonary:     Effort: Pulmonary effort is normal. No respiratory distress.     Breath sounds: Normal breath sounds.  Abdominal:     General: Abdomen is flat.     Palpations: Abdomen is soft.     Tenderness: There is no abdominal tenderness.  Musculoskeletal:        General: Normal range of motion.       Arms:     Cervical back: Normal range of motion.     Right lower leg: No edema.     Left lower leg: No edema.  Skin:    General: Skin is warm and dry.     Capillary Refill: Capillary refill takes  less than 2 seconds.  Neurological:     Mental Status: She is alert and oriented to person, place, and time.     GCS: GCS eye subscore is 4. GCS verbal subscore is 5. GCS motor subscore is 6.     Cranial Nerves: Cranial nerves 2-12 are intact. No dysarthria.     Sensory: Sensation is intact.     Motor: Motor function is intact. No tremor.     Coordination: Coordination is intact.     Gait: Gait is intact.  Psychiatric:        Mood and Affect: Mood normal.        Behavior: Behavior normal.    ED Results / Procedures / Treatments   Labs (all labs ordered are listed, but only abnormal results are displayed) Labs Reviewed  BASIC METABOLIC PANEL - Abnormal; Notable for the following components:      Result Value   Sodium 130 (*)    Chloride 95 (*)    CO2 20 (*)    Glucose, Bld 401 (*)    All other components within normal limits  CBC - Abnormal; Notable for the following components:   WBC 3.7 (*)    Platelets 133 (*)    All other components within normal limits  URINALYSIS, ROUTINE W REFLEX MICROSCOPIC - Abnormal; Notable for the following components:   APPearance CLOUDY (*)    Glucose, UA >=500 (*)    All other components within normal limits  URINALYSIS, MICROSCOPIC (REFLEX) - Abnormal; Notable for the following components:   Bacteria, UA FEW (*)    All other components within normal limits  CBG MONITORING, ED - Abnormal; Notable for the following components:   Glucose-Capillary 392 (*)    All other components within normal limits  I-STAT VENOUS BLOOD GAS, ED - Abnormal; Notable for the following components:   pH, Ven 7.436 (*)    pCO2, Ven 36.9 (*)    pO2, Ven 54 (*)    Sodium 131 (*)    All other components within normal limits  CBG MONITORING, ED - Abnormal; Notable for the following components:   Glucose-Capillary 351 (*)    All other components within normal limits  PREGNANCY, URINE  OSMOLALITY    EKG None  Radiology No results  found.  Procedures Procedures    Medications Ordered in ED Medications  lidocaine (LIDODERM) 5 % 1 patch (1 patch Transdermal Patch Applied 12/30/21 1216)  lactated ringers bolus 1,000 mL ( Intravenous Stopped 12/30/21 1436)  metoCLOPramide (REGLAN) injection 5 mg (5 mg Intravenous Given 12/30/21 1205)  diphenhydrAMINE (BENADRYL) injection 25 mg (25 mg Intravenous Given 12/30/21 1205)  magnesium sulfate IVPB 2 g 50 mL (0 g Intravenous Stopped 12/30/21 1229)    ED Course/ Medical Decision Making/ A&P                           Medical  Decision Making Amount and/or Complexity of Data Reviewed Labs: ordered.  Risk Prescription drug management.    CC: Headache, elevated glucose  This patient presents to the Emergency Department for the above complaint. This involves an extensive number of treatment options and is a complaint that carries with it a high risk of complications and morbidity. Vital signs were reviewed. Serious etiologies considered.  Differential diagnosis includes but is not exclusive to subarachnoid hemorrhage, meningitis, encephalitis, previous head trauma, cavernous venous thrombosis, muscle tension headache, glaucoma, temporal arteritis, migraine or migraine equivalent, DKA, HHS, hyperglycemic crisis etc.   Record review:  Previous records obtained and reviewed  Prior ED visits, prior labs and imaging, prior office notes, home medications  Additional history obtained from N/A  Medical and surgical history as noted above.   Work up as above, notable for:  Labs & imaging results that were available during my care of the patient were visualized by me and considered in my medical decision making.   Cardiac monitoring reviewed and interpreted personally which shows NSR  Labs reviewed, she has elevated glucose but no anion gap, no ketones, she does not have DKA.  Glucosuria consistent with poorly controlled diabetes.  Bicarb is mildly depleted.  Management: Migraine  cocktail, IV fluids   Reassessment:  Patient reports her symptoms have resolved.  She is feeling much better.  Headache resolved.  She is neurologically intact.  Admission was considered.   Advised patient to follow-up with her diabetes specialist.  She does not need refills on her medications.   Give Fioricet for headaches, Robaxin for muscle cramps   Patient presents with headache. Based on the patient's history and physical there is very low clinical suspicion for significant intracranial pathology. The headache was not sudden onset, not maximal at onset, there are no neurologic findings on exam, the patient does not have a fever, the patient does not have any jaw claudication, the patient does not endorse a clotting disorder, patient denies any trauma or eye pain and the headache is not associated with dizziness, weakness on one side of the body, diplopia, vertigo, slurred speech, or ataxia. Given the extremely low risk of these diagnoses further testing and evaluation for these possibilities does not appear to be indicated at this time.   The patient improved significantly and was discharged in stable condition. Detailed discussions were had with the patient regarding current findings, and need for close f/u with PCP or on call doctor. The patient has been instructed to return immediately if the symptoms worsen in any way for re-evaluation. Patient verbalized understanding and is in agreement with current care plan. All questions answered prior to discharge.       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   Drug use: No   Sexual activity: Not Currently    Partners: Male    Birth control/protection: Condom  Other Topics Concern   Not on file  Social History Narrative   RN - [employer]   Regular exercise: no   Caffeine use: 2 x daily   Right-handed.   Lives alone.   Social Determinants of Health   Financial Resource Strain: Not on file  Food Insecurity: Not on file  Transportation Needs: Not on file  Physical Activity: Not on file  Stress: Not on file  Social Connections: Not on file  Intimate Partner Violence: Not on file      This chart was dictated using voice recognition software.  Despite best efforts to proofread,  errors can occur which can change the documentation meaning.         Final Clinical Impression(s) / ED Diagnoses Final diagnoses:  Hyperglycemia  Nonintractable headache, unspecified chronicity pattern, unspecified headache type    Rx / DC Orders ED Discharge Orders          Ordered    butalbital-acetaminophen-caffeine (FIORICET) 50-325-40 MG tablet  Every 6 hours PRN,   Status:  Discontinued        12/30/21 1426    sodium chloride 1 g tablet  Daily,   Status:  Discontinued        12/30/21 1428    methocarbamol (ROBAXIN) 500 MG tablet  2 times daily        12/30/21 1449    butalbital-acetaminophen-caffeine (FIORICET) 50-325-40 MG tablet  Every 6 hours PRN        12/30/21 1449              Jeanell Sparrow, DO 12/30/21 1551

## 2021-12-30 NOTE — ED Notes (Signed)
Patient reports that yesterday she had a loss of vision and her head was pounding. Patient reports she still currently has a headache

## 2021-12-30 NOTE — Progress Notes (Signed)
Virtual Visit Consent   Tracy [name], you are scheduled for a virtual visit with a [facility] provider today. Just as with appointments in the office, your consent must be obtained to participate. Your consent will be active for this visit and any virtual visit you may have with one of our providers in the next 365 days. If you have a MyChart account, a copy of this consent can be sent to you electronically.  As this is a virtual visit, video technology does not allow for your provider to perform a traditional examination. This may limit your provider's ability to fully assess your condition. If your provider identifies any concerns that need to be evaluated in person or the need to arrange testing (such as labs, EKG, etc.), we will make arrangements to do so. Although advances in technology are sophisticated, we cannot ensure that it will always work on either your end or our end. If the connection with a video visit is poor, the visit may have to be switched to a telephone visit. With either a video or telephone visit, we are not always able to ensure that we have a secure connection.  By engaging in this virtual visit, you consent to the provision of healthcare and authorize for your insurance to be billed (if applicable) for the services provided during this visit. Depending on your insurance coverage, you may receive a charge related to this service.  I need to obtain your verbal consent now. Are you willing to proceed with your visit today? Tracy [name] has provided verbal consent on 12/30/2021 for a virtual visit (video or telephone). Evelina Dun, FNP  Date: 12/30/2021 10:06 AM  Virtual Visit via Video Note   I, Evelina Dun, connected with  Tracy [name]  ([id number], 1965/08/18) on 12/30/21 at  9:15 AM EDT by a video-enabled telemedicine application and verified that I am speaking with the correct person using two identifiers.  Location: Patient: Virtual Visit Location  Patient: Home Provider: Virtual Visit Location Provider: Home Office   I discussed the limitations of evaluation and management by telemedicine and the availability of in person appointments. The patient expressed understanding and agreed to proceed.    History of Present Illness: Tracy [name] is a 56 y.o. who identifies as a female who was assigned female at birth, and is being seen today for muscle cramps that started a week ago. Then started having a headache in the last two days. She checked her glucose levels yesterday and it was 363. She took humalog 16 units yesterday and tresiba 30 units.Reports dry mouth, muscle cramps, frequent urination. Her glucose is still in high 300's.  HPI: HPI  Problems:  Patient Active Problem List   Diagnosis Date Noted   COVID 10/08/2021   Arthritis 08/09/2021   Bilateral swelling of feet and ankles 08/09/2021   Cancer ([place]) 08/09/2021   Chewing difficulty 08/09/2021   Constipation 08/09/2021   Dental crowns present 08/09/2021   Diabetes mellitus without complication ([place]) 35/00/9381   Food allergy 08/09/2021   History of kidney stones 08/09/2021   History of migraine headaches 08/09/2021   Hypothyroidism 08/09/2021   IBS (irritable bowel syndrome) 08/09/2021   Joint pain 82/99/3716   Lichen planus 96/78/9381   Liver cirrhosis secondary to NASH (nonalcoholic steatohepatitis) ([place]) 08/09/2021   NASH (nonalcoholic steatohepatitis) 08/09/2021   Osteoarthritis 08/09/2021   Other fatigue 08/09/2021   PONV (postoperative nausea and vomiting) 08/09/2021   Shortness of breath on exertion 08/09/2021  DOE (dyspnea on exertion) 03/21/2021   Coronary artery disease 03/21/2021   Diarrhea    Gastroesophageal reflux disease    Nausea without vomiting    Gastritis and gastroduodenitis    Diverticulosis of colon without hemorrhage    Grade II internal hemorrhoids    Dyspepsia 12/14/2020   Leg swelling 09/21/2020   Morbid obesity with BMI of  40.0-44.9, adult ([place]) 06/23/2020   Chronic left shoulder pain 05/17/2020   Oral mucosal lesion 06/17/2019   Facial rash 06/16/2019   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   Unilateral primary osteoarthritis, left hip 02/10/2019   Greater trochanteric bursitis of left hip 07/24/2018   Arthritis of left hip 06/25/2018   Primary osteoarthritis of left knee 01/21/2018   Vitamin D deficiency 01/04/2018   Lower back pain 07/18/2017   Hemorrhoids, internal 07/18/2017   Trigeminal neuralgia 01/18/2016   Bipolar disorder ([place]) 11/28/2015   Borderline personality disorder ([place]) 11/28/2015   Carpal tunnel syndrome 09/06/2015   MDD (major depressive disorder) 06/06/2015   Severe episode of recurrent major depressive disorder, without psychotic features ([place])    History of posttraumatic stress disorder (PTSD)    GAD (generalized anxiety disorder)    Insulin resistance 02/09/2015   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/2015   Acanthosis nigricans, acquired 40/98/1191   Nonalcoholic fatty liver disease 02/09/2015   Female hirsutism 02/09/2015   Infertility associated with anovulation 02/09/2015   Anxiety and depression 10/31/2014   Obstructive sleep apnea 06/27/2014   Diabetes mellitus, type 2 ([place]) 06/10/2013   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[place]) 01/01/2012   Cystic teratoma    PCOS (polycystic ovarian syndrome)    TOBACCO USE, QUIT 02/08/2010   HYPOTHYROIDISM, POSTSURGICAL 10/02/2007   POLYCYSTIC OVARIAN DISEASE 10/02/2007   Treatment-resistant depression 10/02/2007   Migraine 10/02/2007   Essential hypertension 10/02/2007   RENAL CALCULUS, HX OF 10/02/2007    Allergies:  Allergies  Allergen Reactions   Sulfa Antibiotics Itching, Swelling and Rash   Food     Walnut-mouth swelling/itching   Bupropion Hcl Itching and Rash   Codeine Itching   Sulfamethoxazole-Trimethoprim Rash   Medications:  Current Outpatient Medications:    albuterol (VENTOLIN HFA) 108 (90 Base) MCG/ACT inhaler,  Inhale 2 puffs into the lungs every 6 (six) hours as needed for wheezing or shortness of breath., Disp: 8 g, Rfl: 3   Armodafinil 200 MG TABS, Take 1 tablet by mouth as needed for sleepiness, Disp: 30 tablet, Rfl: 2   benzonatate (TESSALON PERLES) 100 MG capsule, Take 1 capsule (100 mg total) by mouth 3 (three) times daily as needed., Disp: 20 capsule, Rfl: 0   clonazePAM (KLONOPIN) 1 MG tablet, Take 1 tablet by mouth 3 times daily., Disp: 90 tablet, Rfl: 2   Continuous Blood Gluc Sensor (DEXCOM G6 SENSOR) MISC, Replace every 10 days.  Use to check blood sugar., Disp: 9 each, Rfl: 1   Continuous Blood Gluc Transmit (DEXCOM G6 TRANSMITTER) MISC, Use to check continuous blood sugar.  Replace every 90 days, Disp: 1 each, Rfl: 3   cyclobenzaprine (FLEXERIL) 10 MG tablet, TAKE 1 TABLET BY MOUTH 2 TIMES DAILY AS NEEDED FOR MUSCLE SPASMS, Disp: 20 tablet, Rfl: 0   Esketamine HCl, 56 MG Dose, (SPRAVATO, 56 MG DOSE,) 28 MG/DEVICE SOPK, Administer 56 mg ( 2 of the 28 mg) intranasally as directed. (Patient not taking: Reported on 09/14/2021), Disp: 2 each, Rfl: 0   ferrous sulfate 325 (65 Tracy) MG EC tablet, Take 1 tablet (325 mg total) by mouth in the  morning and at bedtime. Take iron supplement 2 hours before or 4 hours after PPI or other antacids as this requires some level of gastric acidity to aid in absorption, Disp: 60 tablet, Rfl: 5   folic acid (FOLVITE) 1 MG tablet, Take 1 tablet by mouth daily., Disp: 90 tablet, Rfl: 0   furosemide (LASIX) 40 MG tablet, TAKE 1 TABLET BY MOUTH DAILY., Disp: 30 tablet, Rfl: 3   glucose blood (FREESTYLE LITE) test strip, Use as instructed, Disp: 100 each, Rfl: 12   HYDROcodone bit-homatropine (HYCODAN) 5-1.5 MG/5ML syrup, Take 5 mLs by mouth every 8 hours as needed for cough., Disp: 120 mL, Rfl: 0   hydrOXYzine (ATARAX) 25 MG tablet, Take 1 tablet (25 mg total) by mouth 3 (three) times daily as needed for anxiety., Disp: 90 tablet, Rfl: 3   insulin lispro (HUMALOG KWIKPEN)  100 UNIT/ML KwikPen, Inject up to 16 units 3 times daily at mealtime per sliding scale. (Patient not taking: Reported on 09/14/2021), Disp: 15 mL, Rfl: 11   Insulin Pen Needle (UNIFINE PENTIPS) 31G X 5 MM MISC, Use as directed to inject insulin, Disp: 100 each, Rfl: 3   lamoTRIgine (LAMICTAL) 200 MG tablet, Take 2 tablets by mouth at bedtime., Disp: 180 tablet, Rfl: 0   lidocaine (XYLOCAINE) 5 % ointment, Apply 1 application topically 3 (three) times daily as needed., Disp: 35.44 g, Rfl: 0   lisinopril (ZESTRIL) 10 MG tablet, Take 1 tablet (10 mg total) by mouth daily., Disp: 90 tablet, Rfl: 3   meloxicam (MOBIC) 15 MG tablet, Take one tablet by mouth every morning with a meal for 2 weeks, then daily as needed for pain., Disp: 30 tablet, Rfl: 0   NUVIGIL 200 MG TABS, Take 1 tablet by mouth daily as needed (sleepiness)., Disp: 30 tablet, Rfl: 2   nystatin (NYAMYC) powder, Apply 3 times daily as directed., Disp: 60 g, Rfl: 3   ondansetron (ZOFRAN-ODT) 4 MG disintegrating tablet, Dissolve 1 tablet (4 mg total) by mouth 2 (two) times daily as needed for nausea or vomiting., Disp: 20 tablet, Rfl: 1   pantoprazole (PROTONIX) 40 MG tablet, Take 2 tablets (80 mg total) by mouth 2 (two) times daily., Disp: 60 tablet, Rfl: 3   predniSONE (DELTASONE) 50 MG tablet, Take 1 tablet  by mouth daily., Disp: 5 tablet, Rfl: 0   risperiDONE (RISPERDAL) 1 MG tablet, Take 1 tablet (1 mg total) by mouth at bedtime., Disp: 90 tablet, Rfl: 1   rizatriptan (MAXALT) 10 MG tablet, Take 1 tablet (10 mg total) by mouth as needed for migraine. May repeat in 2 hours if needed (Patient taking differently: Take 10 mg by mouth every 2 (two) hours as needed for migraine. May repeat in 2 hours if needed), Disp: 10 tablet, Rfl: 0   rosuvastatin (CRESTOR) 20 MG tablet, TAKE 1 TABLET BY MOUTH DAILY., Disp: 90 tablet, Rfl: 3   Semaglutide (RYBELSUS) 14 MG TABS, Take 1 tablet by mouth daily., Disp: 90 tablet, Rfl: 1   sertraline (ZOLOFT) 100  MG tablet, TAKE 2 TABLETS BY MOUTH EVERY MORNING, Disp: 180 tablet, Rfl: 3   spironolactone (ALDACTONE) 50 MG tablet, Take 1 tablet by mouth 2 times daily., Disp: 180 tablet, Rfl: 2   sucralfate (CARAFATE) 1 g tablet, Take 1 g by mouth 4 (four) times daily. (Patient not taking: Reported on 09/14/2021), Disp: , Rfl:    SYNTHROID 300 MCG tablet, TAKE 1 TABLET BY MOUTH ONCE A DAY BEFORE BREAKFAST, Disp: 90 tablet, Rfl: 1  Vitamin D, Ergocalciferol, (DRISDOL) 1.25 MG (50000 UNIT) CAPS capsule, TAKE 1 CAPSULE BY MOUTH EVERY 7 DAYS., Disp: 12 capsule, Rfl: 1  Observations/Objective: Patient is well-developed, well-nourished in no acute distress.  Resting comfortably  at home.  Head is normocephalic, atraumatic.  No labored breathing.  Speech is clear and coherent with logical content.  Patient is alert and oriented at baseline.  Laying in bed. Looks like she does not feel well.   Assessment and Plan: 1. Type 2 diabetes mellitus with other specified complication, without long-term current use of insulin (HCC)  2. Elevated glucose  3. Myalgia  Given current symptoms, worrisome for DKA. PT will go to ED.  Continue medications  Strict low carb diet   Follow Up Instructions: I discussed the assessment and treatment plan with the patient. The patient was provided an opportunity to ask questions and all were answered. The patient agreed with the plan and demonstrated an understanding of the instructions.  A copy of instructions were sent to the patient via MyChart unless otherwise noted below.     The patient was advised to call back or seek an in-person evaluation if the symptoms worsen or if the condition fails to improve as anticipated.  Time:  I spent 12 minutes with the patient via telehealth technology discussing the above problems/concerns.    Evelina Dun, FNP

## 2022-01-01 ENCOUNTER — Ambulatory Visit: Payer: 59 | Admitting: Sports Medicine

## 2022-01-01 ENCOUNTER — Encounter: Payer: Self-pay | Admitting: Sports Medicine

## 2022-01-01 ENCOUNTER — Other Ambulatory Visit (HOSPITAL_COMMUNITY): Payer: Self-pay

## 2022-01-01 ENCOUNTER — Telehealth: Payer: Self-pay | Admitting: Adult Health

## 2022-01-01 DIAGNOSIS — F32A Depression, unspecified: Secondary | ICD-10-CM | POA: Diagnosis not present

## 2022-01-01 DIAGNOSIS — F419 Anxiety disorder, unspecified: Secondary | ICD-10-CM

## 2022-01-01 DIAGNOSIS — E1169 Type 2 diabetes mellitus with other specified complication: Secondary | ICD-10-CM

## 2022-01-01 MED ORDER — FREESTYLE LITE W/DEVICE KIT
PACK | 0 refills | Status: DC
  Filled 2022-01-01: qty 1, 30d supply, fill #0

## 2022-01-01 MED ORDER — FREESTYLE LITE TEST VI STRP
ORAL_STRIP | 12 refills | Status: DC
  Filled 2022-01-01: qty 100, 25d supply, fill #0

## 2022-01-01 MED ORDER — MOUNJARO 2.5 MG/0.5ML ~~LOC~~ SOAJ
2.5000 mg | SUBCUTANEOUS | 0 refills | Status: DC
  Filled 2022-01-01: qty 2, 28d supply, fill #0

## 2022-01-01 MED ORDER — FREESTYLE LITE W/DEVICE KIT
PACK | 99 refills | Status: DC
  Filled 2022-01-01 – 2022-07-31 (×3): qty 1, 30d supply, fill #0

## 2022-01-01 MED ORDER — LANCETS 30G MISC
12 refills | Status: AC
  Filled 2022-01-01: qty 100, 25d supply, fill #0
  Filled 2022-01-25: qty 100, 25d supply, fill #1

## 2022-01-01 MED ORDER — FOLIC ACID 1 MG PO TABS
1.0000 mg | ORAL_TABLET | Freq: Every day | ORAL | 0 refills | Status: DC
  Filled 2022-01-01 – 2022-02-17 (×2): qty 90, 90d supply, fill #0

## 2022-01-01 NOTE — Assessment & Plan Note (Signed)
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 Currently on multiple medications including high-dose Zoloft, Risperdal, Lamictal, benzo. She has tried multiple medications, she has tried ECT, [place]. I think her current stress with work and is certainly contributing to her diabetes and diabetes being uncontrolled is contributing to her current stress. We will focus our efforts on controlling her diabetes aggressively.

## 2022-01-01 NOTE — Addendum Note (Signed)
Addended by: Dema Severin on: 01/01/2022 12:02 PM   Modules accepted: Orders

## 2022-01-01 NOTE — Telephone Encounter (Signed)
Pt lvm at 12:33 pm asking for a call back about matrix forms . She wants to go over the dates that need to be put on the form. Please call  her at [phone]

## 2022-01-01 NOTE — Assessment & Plan Note (Signed)
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 She is currently doing Rybelsus 14 and Tresiba 40 twice daily. She is also on a very high dose of Synthroid some wondering if she is just not absorbing the Rybelsus adequately. I do think we should switch from Rybelsus to an injectable GLP-1 for parenteral treatment, adding Mounjaro, we may have to try Trulicity if Maduro not approved. She did not tolerate Ozempic in the past. We will also have her increase the Tresiba morning and evening dose by 3 units every time her blood sugar is above 120. We will check all of her labs, including urinalysis, she did have some bacteriuria at her ED visit so if she is having a UTI we would need to treat it.

## 2022-01-01 NOTE — Progress Notes (Signed)
    Procedures performed today:    None.  Independent interpretation of notes and tests performed by another provider:   None.  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 She is currently doing Rybelsus 14 and Tresiba 40 twice daily. She is also on a very high dose of Synthroid some wondering if she is just not absorbing the Rybelsus adequately. I do think we should switch from Rybelsus to an injectable GLP-1 for parenteral treatment, adding Mounjaro, we may have to try Trulicity if [place] not approved. She did not tolerate Ozempic in the past. We will also have her increase the Tresiba morning and evening dose by 3 units every time her blood sugar is above 120. We will check all of her labs, including urinalysis, she did have some bacteriuria at her ED visit so if she is having a UTI we would need to treat it.  Anxiety and depression Works with the behavioral health team. Currently on multiple medications including high-dose Zoloft, Risperdal, Lamictal, benzo. She has tried multiple medications, she has tried ECT, [place]. I think her current stress with work and is certainly contributing to her diabetes and diabetes being uncontrolled is contributing to her current stress. We will focus our efforts on controlling her diabetes aggressively.  Chronic process with exacerbation and pharmacologic intervention  ___________________________________________ [name], M.D., ABFM., CAQSM. Primary Care and [place] Instructor of [place] [employer] [facility] [employer]

## 2022-01-02 DIAGNOSIS — Z0289 Encounter for other administrative examinations: Secondary | ICD-10-CM

## 2022-01-02 DIAGNOSIS — E1169 Type 2 diabetes mellitus with other specified complication: Secondary | ICD-10-CM | POA: Diagnosis not present

## 2022-01-02 NOTE — Telephone Encounter (Signed)
Rtc to pt and she reports we should have received new paper work from [employer], we don't have anything new but I do have blank copies of her previous. I updated her leave # [id number] and reference # [id number] and went over her date requests for intermittent leave. Informed her since Rollene Fare is out of office I would get Dr. Clovis Pu to sign and I would fax back to Verline Lema f [phone]

## 2022-01-03 ENCOUNTER — Encounter: Payer: Self-pay | Admitting: Sports Medicine

## 2022-01-03 ENCOUNTER — Telehealth: Payer: 59 | Admitting: Family Medicine

## 2022-01-03 LAB — URINALYSIS W MICROSCOPIC + REFLEX CULTURE
Bacteria, UA: NONE SEEN /HPF
Bilirubin Urine: NEGATIVE
Hgb urine dipstick: NEGATIVE
Hyaline Cast: NONE SEEN /LPF
Ketones, ur: NEGATIVE
Leukocyte Esterase: NEGATIVE
Nitrites, Initial: NEGATIVE
Protein, ur: NEGATIVE
RBC / HPF: NONE SEEN /HPF (ref 0–2)
Specific Gravity, Urine: 1.023 (ref 1.001–1.035)
Squamous Epithelial / HPF: NONE SEEN /HPF (ref <=5)
WBC, UA: NONE SEEN /HPF (ref 0–5)
pH: 5.5 (ref 5.0–8.0)

## 2022-01-03 LAB — COMPLETE METABOLIC PANEL WITH GFR
AG Ratio: 1.3 (calc) (ref 1.0–2.5)
ALT: 54 U/L — ABNORMAL HIGH (ref 6–29)
AST: 60 U/L — ABNORMAL HIGH (ref 10–35)
Albumin: 3.9 g/dL (ref 3.6–5.1)
Alkaline phosphatase (APISO): 111 U/L (ref 37–153)
BUN: 14 mg/dL (ref 7–25)
CO2: 24 mmol/L (ref 20–32)
Calcium: 9.5 mg/dL (ref 8.6–10.4)
Chloride: 102 mmol/L (ref 98–110)
Creat: 0.73 mg/dL (ref 0.50–1.03)
Globulin: 2.9 g/dL (calc) (ref 1.9–3.7)
Glucose, Bld: 299 mg/dL — ABNORMAL HIGH (ref 65–99)
Potassium: 4.2 mmol/L (ref 3.5–5.3)
Sodium: 135 mmol/L (ref 135–146)
Total Bilirubin: 0.5 mg/dL (ref 0.2–1.2)
Total Protein: 6.8 g/dL (ref 6.1–8.1)
eGFR: 98 mL/min/{1.73_m2} (ref >=60)

## 2022-01-03 LAB — NO CULTURE INDICATED

## 2022-01-03 LAB — CBC
HCT: 42.5 % (ref 35.0–45.0)
Hemoglobin: 14.1 g/dL (ref 11.7–15.5)
MCH: 30.4 pg (ref 27.0–33.0)
MCHC: 33.2 g/dL (ref 32.0–36.0)
MCV: 91.6 fL (ref 80.0–100.0)
MPV: 10.9 fL (ref 7.5–12.5)
Platelets: 121 10*3/uL — ABNORMAL LOW (ref 140–400)
RBC: 4.64 10*6/uL (ref 3.80–5.10)
RDW: 13.9 % (ref 11.0–15.0)
WBC: 3 10*3/uL — ABNORMAL LOW (ref 3.8–10.8)

## 2022-01-03 LAB — TSH: TSH: 0.26 mIU/L — ABNORMAL LOW

## 2022-01-03 LAB — HEMOGLOBIN A1C
Hgb A1c MFr Bld: 12.1 % of total Hgb — ABNORMAL HIGH (ref <5.7)
Mean Plasma Glucose: 301 mg/dL
eAG (mmol/L): 16.6 mmol/L

## 2022-01-03 LAB — LIPID PANEL
Cholesterol: 160 mg/dL (ref <200)
HDL: 37 mg/dL — ABNORMAL LOW (ref >=50)
LDL Cholesterol (Calc): 85 mg/dL (calc)
Non-HDL Cholesterol (Calc): 123 mg/dL (calc) (ref <130)
Total CHOL/HDL Ratio: 4.3 (calc) (ref <5.0)
Triglycerides: 316 mg/dL — ABNORMAL HIGH (ref <150)

## 2022-01-03 LAB — C-PEPTIDE: C-Peptide: 4.12 ng/mL — ABNORMAL HIGH (ref 0.80–3.85)

## 2022-01-08 DIAGNOSIS — F331 Major depressive disorder, recurrent, moderate: Secondary | ICD-10-CM | POA: Diagnosis not present

## 2022-01-08 DIAGNOSIS — F3181 Bipolar II disorder: Secondary | ICD-10-CM | POA: Diagnosis not present

## 2022-01-09 DIAGNOSIS — F3181 Bipolar II disorder: Secondary | ICD-10-CM | POA: Diagnosis not present

## 2022-01-15 ENCOUNTER — Encounter: Payer: Self-pay | Admitting: Family Medicine

## 2022-01-15 ENCOUNTER — Telehealth (INDEPENDENT_AMBULATORY_CARE_PROVIDER_SITE_OTHER): Payer: 59 | Admitting: Family Medicine

## 2022-01-15 DIAGNOSIS — E1169 Type 2 diabetes mellitus with other specified complication: Secondary | ICD-10-CM

## 2022-01-15 MED ORDER — TIRZEPATIDE 5 MG/0.5ML ~~LOC~~ SOAJ
5.0000 mg | SUBCUTANEOUS | 0 refills | Status: DC
  Filled 2022-01-15: qty 2, 28d supply, fill #0

## 2022-01-15 NOTE — Progress Notes (Signed)
12-16 humalog Tresiba 25 Tracy [name] - 56 y.o. female MRN [id number]  Date of birth: 1965-08-11   This visit type was conducted due to national recommendations for restrictions regarding the COVID-19 Pandemic (e.g. social distancing).  This format is felt to be most appropriate for this patient at this time.  All issues noted in this document were discussed and addressed.  No physical exam was performed (except for noted visual exam findings with Video Visits).  I discussed the limitations of evaluation and management by telemedicine and the availability of in person appointments. The patient expressed understanding and agreed to proceed.  I connected withNAME@ on 01/15/22 at  1:10 PM EDT by a video enabled telemedicine application and verified that I am speaking with the correct person using two identifiers.  Present at visit: Tracy Nutting, DO Tracy [name]   Patient Location: Home [address] [place] [id number]   Provider location:   [place]  No chief complaint on file.   HPI  Tracy [name] is a 56 y.o. female who presents via audio/video conferencing for a telehealth visit today.  She is following up today for diabetes and hyperglycemia.  Seen by Dr. Dianah Field around 2 weeks ago for hyperglycemia.  She has been titrating her Tyler Aas and is currently up to 63 units daily.  She is following a sliding scale for her Humalog.  Darcel Bayley was also added at that time.  She does report the blood sugars have improved some.  Still in the 200-250 range.  She is not currently having symptoms related to hypoglycemia.  She has not had any low blood sugar readings.  She has been making some changes to her diet and her weight is down some.   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   Carpal tunnel syndrome 09/06/2015   Chewing difficulty    Chronic left shoulder pain 05/17/2020   Constipation    Coronary artery disease 03/21/2021   COVID 10/08/2021   Cystic teratoma    BENIGN   Dental crowns present    Diabetes mellitus without complication ([place])    Diabetes mellitus, type 2 ([place]) 06/10/2013   Diarrhea    Diverticulosis of colon without hemorrhage    DOE (dyspnea on exertion) 03/21/2021   Dyspepsia 12/14/2020   Essential hypertension 10/02/2007   Qualifier: Diagnosis of  By: Loanne Drilling MD, Hilliard Clark A    Facial rash 06/16/2019   Female hirsutism 02/09/2015   Food allergy    Walnuts   GAD (generalized anxiety disorder)    Gastritis and gastroduodenitis    Gastroesophageal reflux disease    Grade II internal hemorrhoids    Greater trochanteric bursitis of left hip 07/24/2018   Hemorrhoids, internal 07/18/2017   History of kidney stones    History of migraine headaches    History of posttraumatic stress disorder (PTSD)    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/2015   Hypothyroidism    HYPOTHYROIDISM, POSTSURGICAL 10/02/2007   Qualifier: Diagnosis of  By: Loanne Drilling MD, Sean A    IBS (irritable bowel syndrome)    Infertility  associated with anovulation 02/09/2015   Insulin resistance 02/09/2015   Joint pain    Leg swelling 0/93/2671   Lichen planus    Liver cirrhosis secondary to NASH (nonalcoholic steatohepatitis) ([place])    Lower back pain 07/18/2017   MDD (major depressive disorder) 06/06/2015   Migraine 10/02/2007   Morbid obesity with BMI of 40.0-44.9, adult ([place]) 06/23/2020   NASH (nonalcoholic steatohepatitis)    Nausea without vomiting    Nonalcoholic fatty liver disease 02/09/2015   Obstructive sleep apnea 06/27/2014   Oral mucosal lesion 06/17/2019   Osteoarthritis    Other fatigue    PCOS (polycystic  ovarian syndrome)    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/02/2007   Qualifier: Diagnosis of  By: Loanne Drilling MD, Sean A    Trigeminal neuralgia    Unilateral primary osteoarthritis, left hip 02/10/2019   Vitamin D deficiency     Past Surgical History:  Procedure Laterality Date   ACHILLES TENDON SURGERY Left 2007   APPENDECTOMY     CARPAL TUNNEL RELEASE Right 01/12/2016   Procedure: RIGHT CARPAL TUNNEL RELEASE;  Surgeon: Roseanne Kaufman, MD;  Location: [facility];  Service: Orthopedics;  Laterality: Right;   COLONOSCOPY WITH PROPOFOL N/A 04/20/2014   Procedure: COLONOSCOPY WITH PROPOFOL;  Surgeon: Arta Silence, MD;  Location: [facility];  Service: Endoscopy;  Laterality: N/A;   COLONOSCOPY WITH PROPOFOL N/A 01/10/2021   Procedure: COLONOSCOPY WITH PROPOFOL;  Surgeon: Lavena Bullion, DO;  Location: [facility];  Service: Gastroenterology;  Laterality: N/A;   ESOPHAGOGASTRODUODENOSCOPY (EGD) WITH PROPOFOL N/A 04/20/2014   Procedure: ESOPHAGOGASTRODUODENOSCOPY (EGD) WITH PROPOFOL;  Surgeon: Arta Silence, MD;  [facility];  Service: Endoscopy;  Laterality: N/A;   ESOPHAGOGASTRODUODENOSCOPY (EGD) WITH PROPOFOL N/A 01/10/2021   Procedure: ESOPHAGOGASTRODUODENOSCOPY (EGD) WITH PROPOFOL;  Surgeon: Lavena Bullion, DO;  [facility];  Service: Gastroenterology;   Laterality: N/A;   EXCISION HAGLUND'S DEFORMITY WITH ACHILLES TENDON REPAIR Right 02/25/2013   Procedure: RIGHT ACHILLES DEBRIDEMENT AND RECONSTRUCTION;  HAGLUND'S EXCISION; GASTROC RECESSION AND FLEXOR HALLUCIS LONGUS TRANSFER;  Surgeon: Wylene Simmer, MD;  Location: [facility];  Service: Orthopedics;  Laterality: Right;   JOINT REPLACEMENT     hip   LUMBAR LAMINECTOMY     X 3   neck fusion     OVARIAN CYST SURGERY Left 1998   PILONIDAL CYST EXCISION  1994   RIGHT OOPHORECTOMY     STERIOD INJECTION Left 01/12/2016   Procedure: STEROID INJECTION LEFT WRIST;  Surgeon: Roseanne Kaufman, MD;  Location: [facility];  Service: Orthopedics;  Laterality: Left;   TOTAL HIP ARTHROPLASTY Left 03/19/2019   Procedure: LEFT TOTAL HIP ARTHROPLASTY ANTERIOR APPROACH;  Surgeon: Mcarthur Rossetti, MD;  Location: [facility];  Service: Orthopedics;  Laterality: Left;   TOTAL THYROIDECTOMY  2003   UPPER GI ENDOSCOPY  01/19/2015    Family History  Problem Relation Age of Onset   Heart disease Father    Depression Father    Heart attack Father    Sudden death Father    Anxiety disorder Father    Bipolar disorder Father    Obesity Father    Cancer Brother        THROAT cancer   Depression Brother    Anxiety disorder Brother    Depression Sister    Breast cancer Mother    Hypertension Mother    Cancer Mother    Depression Mother    Sleep apnea Mother    Obesity Mother    Depression Maternal Grandmother    Stroke Maternal Grandfather    Colon cancer Brother    Pancreatic cancer Neg Hx    Stomach cancer Neg Hx    Liver disease Neg Hx     Social History   Socioeconomic History   Marital status: Divorced    Spouse name: Not on file   Number of children: 0   Years of education: 16   Highest education level: Not on file  Occupational History   Occupation: RN-Telephone Geophysical data processor: [employer]    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   Drug use: No   Sexual activity: Not Currently    Partners: Male    Birth control/protection: Condom  Other Topics Concern   Not on file  Social History Narrative   RN -    Regular exercise: no   Caffeine use: 2 x daily   Right-handed.   Lives alone.   Social Determinants of Health   Financial Resource Strain: Not on file  Food Insecurity: Not on file  Transportation Needs: Not on file  Physical Activity: Not on file  Stress: Not on file  Social Connections: Not on file  Intimate Partner Violence: Not on file     Current Outpatient Medications:    albuterol (VENTOLIN HFA) 108 (90 Base) MCG/ACT inhaler, Inhale 2 puffs into the lungs every 6 (six) hours as needed for wheezing or shortness of breath., Disp: 8 g, Rfl: 3   Blood Glucose Monitoring Suppl (FREESTYLE LITE) w/Device KIT, Use as directed., Disp: 1 kit, Rfl: 0   Blood Glucose Monitoring Suppl (FREESTYLE LITE) w/Device KIT, Check fasting blood sugar every morning and before meals up to 4 times daily., Disp: 1 kit, Rfl: PRN   butalbital-acetaminophen-caffeine (FIORICET) 50-325-40 MG tablet, Take 1-2 tablets by mouth every 6 (six) hours as needed for headache., Disp: 20 tablet, Rfl: 0   clonazePAM (KLONOPIN) 1 MG tablet, Take 1 tablet by mouth 3 times daily., Disp: 90 tablet, Rfl: 2   Continuous Blood Gluc Sensor (DEXCOM G6 SENSOR) MISC, Replace every 10 days.  Use to check blood sugar., Disp: 9 each, Rfl: 1   Continuous Blood Gluc Transmit (DEXCOM G6 TRANSMITTER) MISC, Use to check continuous blood sugar.   Replace every  90 days, Disp: 1 each, Rfl: 3   ferrous sulfate 325 (65 FE) MG EC tablet, Take 1 tablet (325 mg total) by mouth in the morning and at bedtime. Take iron supplement 2 hours before or 4 hours after PPI or other antacids as this requires some level of gastric acidity to aid in absorption, Disp: 60 tablet, Rfl: 5   folic acid (FOLVITE) 1 MG tablet, Take 1 tablet by mouth daily., Disp: 90 tablet, Rfl: 0   glucose blood (FREESTYLE LITE) test strip, Use as instructed, Disp: 100 each, Rfl: 12   glucose blood (FREESTYLE LITE) test strip, Check fasting blood sugar every morning and before meals up to 4 times a day as directed., Disp: 100 strip, Rfl: 12   hydrOXYzine (ATARAX) 25 MG tablet, Take 1 tablet (25 mg total) by mouth 3 (three) times daily as needed for anxiety., Disp: 90 tablet, Rfl: 3   insulin degludec (TRESIBA FLEXTOUCH) 100 UNIT/ML FlexTouch Pen, Start 43 units twice a day, increase by 3 units every time blood sugar prior to injection is above 120, Disp: , Rfl:    insulin lispro (HUMALOG KWIKPEN) 100 UNIT/ML KwikPen, Inject up to 16 units 3 times daily at mealtime per sliding scale., Disp: 15 mL, Rfl: 11   Insulin Pen Needle (UNIFINE PENTIPS) 31G X 5 MM MISC, Use as directed to inject insulin, Disp: 100 each, Rfl: 3   lamoTRIgine (LAMICTAL) 200 MG tablet, Take 2 tablets by mouth at bedtime., Disp: 180 tablet, Rfl: 0   Lancets 30G MISC, Check fasting blood sugar every morning and before meals up to 4 times daily., Disp: 100 each, Rfl: 12   lidocaine (XYLOCAINE) 5 % ointment, Apply 1 application topically 3 (three) times daily as needed., Disp: 35.44 g, Rfl: 0   lisinopril (ZESTRIL) 10 MG tablet, Take 1 tablet (10 mg total) by mouth daily., Disp: 90 tablet, Rfl: 3   meloxicam (MOBIC) 15 MG tablet, Take one tablet by mouth every morning with a meal for 2 weeks, then daily as needed for pain., Disp: 30 tablet, Rfl: 0   NUVIGIL 200 MG TABS, Take 1 tablet by mouth daily as needed  (sleepiness)., Disp: 30 tablet, Rfl: 2   nystatin (NYAMYC) powder, Apply 3 times daily as directed., Disp: 60 g, Rfl: 3   ondansetron (ZOFRAN-ODT) 4 MG disintegrating tablet, Dissolve 1 tablet (4 mg total) by mouth 2 (two) times daily as needed for nausea or vomiting., Disp: 20 tablet, Rfl: 1   pantoprazole (PROTONIX) 40 MG tablet, Take 2 tablets (80 mg total) by mouth 2 (two) times daily., Disp: 60 tablet, Rfl: 3   risperiDONE (RISPERDAL) 1 MG tablet, Take 1 tablet (1 mg total) by mouth at bedtime., Disp: 90 tablet, Rfl: 1   rosuvastatin (CRESTOR) 20 MG tablet, TAKE 1 TABLET BY MOUTH DAILY., Disp: 90 tablet, Rfl: 3   sertraline (ZOLOFT) 100 MG tablet, TAKE 2 TABLETS BY MOUTH EVERY MORNING, Disp: 180 tablet, Rfl: 3   spironolactone (ALDACTONE) 50 MG tablet, Take 1 tablet by mouth 2 times daily., Disp: 180 tablet, Rfl: 2   SYNTHROID 300 MCG tablet, TAKE 1 TABLET BY MOUTH ONCE A DAY BEFORE BREAKFAST, Disp: 90 tablet, Rfl: 1   tirzepatide (MOUNJARO) 2.5 MG/0.5ML Pen, Inject 2.5 mg into the skin once a week., Disp: 2 mL, Rfl: 0   Vitamin D, Ergocalciferol, (DRISDOL) 1.25 MG (50000 UNIT) CAPS capsule, TAKE 1 CAPSULE BY MOUTH EVERY 7 DAYS., Disp: 12 capsule, Rfl: 1   furosemide (LASIX) 40 MG  tablet, TAKE 1 TABLET BY MOUTH DAILY., Disp: 30 tablet, Rfl: 3  EXAM:  VITALS per patient if applicable: Ht <[id number]<\/[id number]>_7  (1.803 m)   Wt (!) 308 lb (139.7 kg)   LMP 08/24/2020   BMI 42.96 kg/m   GENERAL: alert, oriented, appears well and in no acute distress  HEENT: atraumatic, conjunttiva clear, no obvious abnormalities on inspection of external nose and ears  NECK: normal movements of the head and neck  LUNGS: on inspection no signs of respiratory distress, breathing rate appears normal, no obvious gross SOB, gasping or wheezing  CV: no obvious cyanosis  MS: moves all visible extremities without noticeable abnormality  PSYCH/NEURO: pleasant and cooperative, no obvious depression or anxiety, speech and  thought processing grossly intact  ASSESSMENT AND PLAN:  Discussed the following assessment and plan:  Diabetes mellitus, type 2 Diabetes is not well controlled at this time.  We will continue titration of Tresiba.  I would have her go ahead and increase to 70 units and continue to titrate as she has been for blood sugar reading over 120.  Continue sliding scale Humalog.  Additionally she will continue Mounjaro.  I will go ahead and send over the 5 mg dose for her to start when she has completed 1 month at 2.5 mg.  Follow-up in 8 weeks      I discussed the assessment and treatment plan with the patient. The patient was provided an opportunity to ask questions and all were answered. The patient agreed with the plan and demonstrated an understanding of the instructions.   The patient was advised to call back or seek an in-person evaluation if the symptoms worsen or if the condition fails to improve as anticipated.    Tracy Nutting, DO

## 2022-01-15 NOTE — Assessment & Plan Note (Addendum)
Diabetes is not well controlled at this time.  We will continue titration of Tresiba.  I would have her go ahead and increase to 70 units and continue to titrate as she has been for blood sugar reading over 120.  Continue sliding scale Humalog.  Additionally she will continue Mounjaro.  I will go ahead and send over the 5 mg dose for her to start when she has completed 1 month at 2.5 mg.  Follow-up in 8 weeks

## 2022-01-16 ENCOUNTER — Other Ambulatory Visit (HOSPITAL_COMMUNITY): Payer: Self-pay

## 2022-01-17 ENCOUNTER — Other Ambulatory Visit (HOSPITAL_COMMUNITY): Payer: Self-pay

## 2022-01-17 DIAGNOSIS — L438 Other lichen planus: Secondary | ICD-10-CM | POA: Diagnosis not present

## 2022-01-17 DIAGNOSIS — K117 Disturbances of salivary secretion: Secondary | ICD-10-CM | POA: Diagnosis not present

## 2022-01-17 MED ORDER — NYSTATIN 100000 UNIT/GM EX OINT
TOPICAL_OINTMENT | CUTANEOUS | 2 refills | Status: DC
  Filled 2022-01-17: qty 15, 30d supply, fill #0
  Filled 2022-10-18: qty 15, 30d supply, fill #1

## 2022-01-17 MED ORDER — PIMECROLIMUS 1 % EX CREA
TOPICAL_CREAM | CUTANEOUS | 3 refills | Status: DC
  Filled 2022-01-17: qty 60, 30d supply, fill #0

## 2022-01-17 MED ORDER — TRIPLE ANTIBIOTIC 3.5-400-5000 EX OINT: TOPICAL_OINTMENT | CUTANEOUS | 2 refills | Status: AC

## 2022-01-19 ENCOUNTER — Encounter: Payer: Self-pay | Admitting: Family Medicine

## 2022-01-21 ENCOUNTER — Other Ambulatory Visit (HOSPITAL_COMMUNITY): Payer: Self-pay

## 2022-01-22 DIAGNOSIS — F3181 Bipolar II disorder: Secondary | ICD-10-CM | POA: Diagnosis not present

## 2022-01-25 ENCOUNTER — Other Ambulatory Visit: Payer: Self-pay | Admitting: Adult Health

## 2022-01-25 ENCOUNTER — Other Ambulatory Visit: Payer: Self-pay | Admitting: Family Medicine

## 2022-01-26 ENCOUNTER — Other Ambulatory Visit (HOSPITAL_COMMUNITY): Payer: Self-pay

## 2022-01-28 ENCOUNTER — Other Ambulatory Visit (HOSPITAL_COMMUNITY): Payer: Self-pay

## 2022-01-28 MED ORDER — FUROSEMIDE 40 MG PO TABS
ORAL_TABLET | Freq: Every day | ORAL | 3 refills | Status: DC
  Filled 2022-01-28: qty 30, 30d supply, fill #0
  Filled 2022-04-01: qty 30, 30d supply, fill #1
  Filled 2022-05-17: qty 30, 30d supply, fill #2
  Filled 2022-06-13: qty 30, 30d supply, fill #3

## 2022-01-28 MED ORDER — PANTOPRAZOLE SODIUM 40 MG PO TBEC
80.0000 mg | DELAYED_RELEASE_TABLET | Freq: Two times a day (BID) | ORAL | 3 refills | Status: DC
  Filled 2022-01-28: qty 60, 15d supply, fill #0
  Filled 2022-02-17: qty 60, 15d supply, fill #1
  Filled 2022-04-01: qty 60, 15d supply, fill #2
  Filled 2022-04-25: qty 60, 15d supply, fill #3

## 2022-01-28 MED ORDER — UNIFINE PENTIPS 31G X 5 MM MISC
3 refills | Status: AC
  Filled 2022-01-28: qty 100, 90d supply, fill #0

## 2022-01-28 MED ORDER — LAMOTRIGINE 200 MG PO TABS
400.0000 mg | ORAL_TABLET | Freq: Every day | ORAL | 0 refills | Status: DC
  Filled 2022-01-28: qty 180, 90d supply, fill #0

## 2022-01-29 ENCOUNTER — Telehealth: Payer: 59 | Admitting: Adult Health

## 2022-01-29 ENCOUNTER — Other Ambulatory Visit (HOSPITAL_COMMUNITY): Payer: Self-pay

## 2022-01-29 ENCOUNTER — Encounter: Payer: Self-pay | Admitting: Adult Health

## 2022-01-29 DIAGNOSIS — F603 Borderline personality disorder: Secondary | ICD-10-CM | POA: Diagnosis not present

## 2022-01-29 DIAGNOSIS — F4310 Post-traumatic stress disorder, unspecified: Secondary | ICD-10-CM | POA: Diagnosis not present

## 2022-01-29 DIAGNOSIS — F339 Major depressive disorder, recurrent, unspecified: Secondary | ICD-10-CM | POA: Diagnosis not present

## 2022-01-29 DIAGNOSIS — F332 Major depressive disorder, recurrent severe without psychotic features: Secondary | ICD-10-CM

## 2022-01-29 DIAGNOSIS — F331 Major depressive disorder, recurrent, moderate: Secondary | ICD-10-CM

## 2022-01-29 DIAGNOSIS — F411 Generalized anxiety disorder: Secondary | ICD-10-CM | POA: Diagnosis not present

## 2022-01-29 DIAGNOSIS — F410 Panic disorder [episodic paroxysmal anxiety] without agoraphobia: Secondary | ICD-10-CM

## 2022-01-29 DIAGNOSIS — G4733 Obstructive sleep apnea (adult) (pediatric): Secondary | ICD-10-CM

## 2022-01-29 MED ORDER — LAMOTRIGINE 200 MG PO TABS
400.0000 mg | ORAL_TABLET | Freq: Every day | ORAL | 0 refills | Status: DC
  Filled 2022-01-29 – 2022-02-17 (×2): qty 180, 90d supply, fill #0

## 2022-01-29 MED ORDER — RISPERIDONE 1 MG PO TABS
1.0000 mg | ORAL_TABLET | Freq: Every day | ORAL | 1 refills | Status: DC
  Filled 2022-01-29: qty 90, 90d supply, fill #0
  Filled 2022-02-17 – 2022-06-12 (×2): qty 90, 90d supply, fill #1

## 2022-01-29 MED ORDER — ARMODAFINIL 200 MG PO TABS
200.0000 mg | ORAL_TABLET | Freq: Every day | ORAL | 2 refills | Status: DC | PRN
  Filled 2022-01-29 – 2022-02-20 (×2): qty 30, 30d supply, fill #0
  Filled 2022-05-17: qty 30, 30d supply, fill #1

## 2022-01-29 NOTE — Progress Notes (Signed)
Tracy [name] [id number] 1966/03/16 56 y.o.  Virtual Visit via Video Note  I connected with pt @ on 01/29/22 at  5:00 PM EDT by a video enabled telemedicine application and verified that I am speaking with the correct person using two identifiers.   I discussed the limitations of evaluation and management by telemedicine and the availability of in person appointments. The patient expressed understanding and agreed to proceed.  I discussed the assessment and treatment plan with the patient. The patient was provided an opportunity to ask questions and all were answered. The patient agreed with the plan and demonstrated an understanding of the instructions.   The patient was advised to call back or seek an in-person evaluation if the symptoms worsen or if the condition fails to improve as anticipated.  I provided 25 minutes of non-face-to-face time during this encounter.  The patient was located at home.  The provider was located at [facility].   Dorothyann Gibbs, NP   Subjective:   Patient ID:  Tracy [name] is a 56 y.o. (DOB 07/02/1966) female.  Chief Complaint: No chief complaint on file.   HPI AHRI STEGENGA presents for follow-up of MDD, GAD, PTSD, and Panic attacks.  Describes mood today as "ok". Pleasant. Flat. Decreased tearfulness. Mood symptoms - reports some depression, anxiety and irritability. More anxious overall - "it's up there". Has been grinding teeth and has broken a tooth. Reports decreased worry and rumination. Denies recent panic attacks - nothing full blown. Reports mood has been consistent.  Seeing therapist regularly.  Recently bought a house and moving to [place] in July. Improved interest and motivation. Taking medications as prescribed. Energy levels vary. Active, does not have a regular exercise routine.  Enjoys some usual interests and activities. Lives alone with 3 dogs. Recently divorced. Siblings local. Spending time with  family. Appetite adequate. Weight stable - 300 pounds.  Sleeps well most nights. Averages 6 to 7 hours. Sleep apnea - taking Nuvigil in the morning.  Denies SI or HI. Denies AH or VH.  Working with [employer]  Previous medication trials:  Antipsychotics: Vraylar-TD, Latuda, Abilify, Geodon, Rexulti, Lybalvi, Risperdal  Mood Stabilizers - Depakote, Lithium, Lamictal, Equetro, Topamax, Trileptal, Gabapentin  SSRI - Zoloft, Lexapro, Celexa, Prozac, Viibryd, Trintellix, Prozac  SNRI - Pristiq, Effexor, Cymbalta  Wellbutrin - allergic reaction  Anti-anxiety - Buspar, Xanax, Clonazepam, Ativan, Valium  Sleep agents - Trazadone, Ambien, Restoril   Other - Deplin, Emsam, Serzone, Doxepin, Nuvigil, Vistaril  Previous treatment: ECT and TMS    Review of Systems:  Review of Systems  Musculoskeletal:  Negative for gait problem.  Neurological:  Negative for tremors.  Psychiatric/Behavioral:         Please refer to HPI    Medications: I have reviewed the patient's current medications.  Current Outpatient Medications  Medication Sig Dispense Refill   albuterol (VENTOLIN HFA) 108 (90 Base) MCG/ACT inhaler Inhale 2 puffs into the lungs every 6 (six) hours as needed for wheezing or shortness of breath. 8 g 3   Blood Glucose Monitoring Suppl (FREESTYLE LITE) w/Device KIT Use as directed. 1 kit 0   Blood Glucose Monitoring Suppl (FREESTYLE LITE) w/Device KIT Check fasting blood sugar every morning and before meals up to 4 times daily. 1 kit PRN   butalbital-acetaminophen-caffeine (FIORICET) 50-325-40 MG tablet Take 1-2 tablets by mouth every 6 (six) hours as needed for headache. 20 tablet 0   clonazePAM (KLONOPIN) 1 MG tablet Take 1 tablet by mouth 3  times daily. 90 tablet 2   Continuous Blood Gluc Sensor (DEXCOM G6 SENSOR) MISC Replace every 10 days.  Use to check blood sugar. 9 each 1   Continuous Blood Gluc Transmit (DEXCOM G6 TRANSMITTER) MISC Use to check continuous blood sugar.   Replace every 90 days 1 each 3   ferrous sulfate 325 (65 FE) MG EC tablet Take 1 tablet (325 mg total) by mouth in the morning and at bedtime. Take iron supplement 2 hours before or 4 hours after PPI or other antacids as this requires some level of gastric acidity to aid in absorption 60 tablet 5   folic acid (FOLVITE) 1 MG tablet Take 1 tablet by mouth daily. 90 tablet 0   furosemide (LASIX) 40 MG tablet TAKE 1 TABLET BY MOUTH DAILY. 30 tablet 3   glucose blood (FREESTYLE LITE) test strip Use as instructed 100 each 12   glucose blood (FREESTYLE LITE) test strip Check fasting blood sugar every morning and before meals up to 4 times a day as directed. 100 strip 12   hydrOXYzine (ATARAX) 25 MG tablet Take 1 tablet (25 mg total) by mouth 3 (three) times daily as needed for anxiety. 90 tablet 3   insulin degludec (TRESIBA FLEXTOUCH) 100 UNIT/ML FlexTouch Pen Start 43 units twice a day, increase by 3 units every time blood sugar prior to injection is above 120     insulin lispro (HUMALOG KWIKPEN) 100 UNIT/ML KwikPen Inject up to 16 units 3 times daily at mealtime per sliding scale. 15 mL 11   Insulin Pen Needle (UNIFINE PENTIPS) 31G X 5 MM MISC Use as directed to inject insulin 100 each 3   lamoTRIgine (LAMICTAL) 200 MG tablet Take 2 tablets by mouth at bedtime. 180 tablet 0   Lancets 30G MISC Check fasting blood sugar every morning and before meals up to 4 times daily. 100 each 12   lidocaine (XYLOCAINE) 5 % ointment Apply 1 application topically 3 (three) times daily as needed. 35.44 g 0   lisinopril (ZESTRIL) 10 MG tablet Take 1 tablet (10 mg total) by mouth daily. 90 tablet 3   meloxicam (MOBIC) 15 MG tablet Take one tablet by mouth every morning with a meal for 2 weeks, then daily as needed for pain. 30 tablet 0   neomycin-bacitracin-polymyxin [phone] OINT Apply topically 2 (two) times daily for 10 days To corners of mouth 14 g 2   NUVIGIL 200 MG TABS Take 1 tablet by mouth daily as needed  (sleepiness). 30 tablet 2   nystatin (NYAMYC) powder Apply 3 times daily as directed. 60 g 3   nystatin ointment (MYCOSTATIN) Apply to the skin 2 times daily to corners of mouth 15 g 2   ondansetron (ZOFRAN-ODT) 4 MG disintegrating tablet Dissolve 1 tablet (4 mg total) by mouth 2 (two) times daily as needed for nausea or vomiting. 20 tablet 1   pantoprazole (PROTONIX) 40 MG tablet Take 2 tablets (80 mg total) by mouth 2 (two) times daily. 60 tablet 3   pimecrolimus (ELIDEL) 1 % cream Apply a small amount to affected area 2 x daily. Gently pat area dry w/cotton gauze. No eating, drinking or speaking for 20 - 30 minutes to allow medicine to be absorbed. Can apply to 2x2 gauze & placed against the affected area. 60 g 3   risperiDONE (RISPERDAL) 1 MG tablet Take 1 tablet (1 mg total) by mouth at bedtime. 90 tablet 1   rosuvastatin (CRESTOR) 20 MG tablet TAKE 1 TABLET BY  MOUTH DAILY. 90 tablet 3   sertraline (ZOLOFT) 100 MG tablet TAKE 2 TABLETS BY MOUTH EVERY MORNING 180 tablet 3   spironolactone (ALDACTONE) 50 MG tablet Take 1 tablet by mouth 2 times daily. 180 tablet 2   SYNTHROID 300 MCG tablet TAKE 1 TABLET BY MOUTH ONCE A DAY BEFORE BREAKFAST 90 tablet 1   tirzepatide (MOUNJARO) 2.5 MG/0.5ML Pen Inject 2.5 mg into the skin once a week. 2 mL 0   tirzepatide (MOUNJARO) 5 MG/0.5ML Pen Inject 5 mg into the skin once a week. 2 mL 0   Vitamin D, Ergocalciferol, (DRISDOL) 1.25 MG (50000 UNIT) CAPS capsule TAKE 1 CAPSULE BY MOUTH EVERY 7 DAYS. 12 capsule 1   No current facility-administered medications for this visit.    Medication Side Effects: None  Allergies:  Allergies  Allergen Reactions   Sulfa Antibiotics Itching, Swelling and Rash   Food     Walnut-mouth swelling/itching   Bupropion Hcl Itching and Rash   Codeine Itching   Sulfamethoxazole-Trimethoprim Rash    Past Medical History:  Diagnosis Date   Acanthosis nigricans, acquired 02/09/2015   Anxiety and depression 10/31/2014    Arthritis    back, knees, right elbow   Arthritis of left hip 06/25/2018   Bilateral swelling of feet and ankles    Bipolar disorder (HCC)    Borderline personality disorder (HCC)    Cancer (HCC)    oral   Carpal tunnel syndrome 09/06/2015   Chewing difficulty    Chronic left shoulder pain 05/17/2020   Constipation    Coronary artery disease 03/21/2021   COVID 10/08/2021   Cystic teratoma    BENIGN   Dental crowns present    Diabetes mellitus without complication (HCC)    Diabetes mellitus, type 2 (HCC) 06/10/2013   Diarrhea    Diverticulosis of colon without hemorrhage    DOE (dyspnea on exertion) 03/21/2021   Dyspepsia 12/14/2020   Essential hypertension 10/02/2007   Qualifier: Diagnosis of  By: Everardo All MD, Sean A    Facial rash 06/16/2019   Female hirsutism 02/09/2015   Food allergy    Walnuts   GAD (generalized anxiety disorder)    Gastritis and gastroduodenitis    Gastroesophageal reflux disease    Grade II internal hemorrhoids    Greater trochanteric bursitis of left hip 07/24/2018   Hemorrhoids, internal 07/18/2017   History of kidney stones    History of migraine headaches    History of posttraumatic stress disorder (PTSD)    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/2015   Hypothyroidism    HYPOTHYROIDISM, POSTSURGICAL 10/02/2007   Qualifier: Diagnosis of  By: Everardo All MD, Sean A    IBS (irritable bowel syndrome)    Infertility associated with anovulation 02/09/2015   Insulin resistance 02/09/2015   Joint pain    Leg swelling 09/21/2020   Lichen planus    Liver cirrhosis secondary to NASH (nonalcoholic steatohepatitis) (HCC)    Lower back pain 07/18/2017   MDD (major depressive disorder) 06/06/2015   Migraine 10/02/2007   Morbid obesity with BMI of 40.0-44.9, adult (HCC) 06/23/2020   NASH (nonalcoholic steatohepatitis)    Nausea without vomiting    Nonalcoholic fatty liver disease 02/09/2015   Obstructive sleep apnea 06/27/2014   Oral mucosal lesion 06/17/2019   Osteoarthritis    Other  fatigue    PCOS (polycystic ovarian syndrome)    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   Qualifier: Diagnosis of  By: Everardo All MD, Sean A    Trigeminal neuralgia    Unilateral primary osteoarthritis, left hip 02/10/2019   Vitamin D deficiency     Family History  Problem Relation Age of Onset   Heart disease Father    Depression Father    Heart attack Father    Sudden death Father    Anxiety disorder Father    Bipolar disorder Father    Obesity Father    Cancer Brother        THROAT cancer   Depression Brother    Anxiety disorder Brother    Depression Sister    Breast cancer Mother    Hypertension Mother    Cancer Mother    Depression Mother    Sleep apnea Mother    Obesity Mother    Depression Maternal Grandmother    Stroke Maternal Grandfather    Colon cancer Brother    Pancreatic cancer Neg Hx    Stomach cancer Neg Hx    Liver disease Neg Hx     Social History   Socioeconomic History   Marital status: Divorced    Spouse name: Not on file   Number of children: 0   Years of education: 16   Highest education level: Not on file  Occupational History   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   Drug use: No   Sexual activity: Not Currently    Partners: Male    Birth control/protection: Condom  Other Topics Concern   Not on file  Social History Narrative   RN - [employer]   Regular exercise: no   Caffeine use: 2 x daily   Right-handed.   Lives alone.   Social Determinants of Health   Financial Resource Strain: Not on file  Food Insecurity: Not on file  Transportation Needs: Not on file  Physical Activity: Not on file  Stress: Not on file  Social Connections: Not on file  Intimate Partner Violence: Not on file    Past Medical History, Surgical history, Social history, and Family history were reviewed and updated as appropriate.   Please see review of systems for further details on the patient's review from today.   Objective:   Physical Exam:  LMP 08/24/2020   Physical Exam Constitutional:  General: She is not in acute distress. Musculoskeletal:        General: No deformity.  Neurological:     Mental Status: She is alert and oriented to person, place, and time.     Coordination: Coordination normal.  Psychiatric:        Attention and Perception: Attention and perception normal. She does not perceive auditory or visual hallucinations.        Mood and Affect: Mood normal. Mood is not anxious or depressed. Affect is not labile, blunt, angry or inappropriate.        Speech: Speech normal.        Behavior: Behavior normal.        Thought Content: Thought content normal. Thought content is not paranoid or delusional. Thought content does not include homicidal or suicidal ideation. Thought content does not include homicidal or suicidal plan.         Cognition and Memory: Cognition and memory normal.        Judgment: Judgment normal.     Comments: Insight intact     Lab Review:     Component Value Date/Time   NA 135 01/02/2022 0000   NA 140 10/11/2020 1041   K 4.2 01/02/2022 0000   CL 102 01/02/2022 0000   CO2 24 01/02/2022 0000   GLUCOSE 299 (H) 01/02/2022 0000   BUN 14 01/02/2022 0000   BUN 14 10/11/2020 1041   CREATININE 0.73 01/02/2022 0000   CALCIUM 9.5 01/02/2022 0000   PROT 6.8 01/02/2022 0000   PROT 7.1 10/11/2020 1041   ALBUMIN 3.8 02/11/2021 1516   ALBUMIN 4.2 10/11/2020 1041   AST 60 (H) 01/02/2022 0000   ALT 54 (H) 01/02/2022 0000   ALKPHOS 172 (H) 02/11/2021 1516   BILITOT 0.5 01/02/2022 0000   BILITOT <0.2 10/11/2020 1041   GFRNONAA >60 12/30/2021 1106   GFRNONAA 99 12/26/2020 0000   GFRAA 115 12/26/2020 0000       Component Value Date/Time   WBC 3.0 (L) 01/02/2022 0000   RBC 4.64 01/02/2022 0000   HGB 14.1 01/02/2022 0000   HGB 10.9 (L) 10/11/2020 1041   HCT 42.5 01/02/2022 0000   HCT 35.6 10/11/2020 1041   PLT 121 (L) 01/02/2022 0000   PLT 224 10/11/2020 1041   MCV 91.6 01/02/2022 0000   MCV 78 (L) 10/11/2020 1041   MCH 30.4 01/02/2022 0000   MCHC 33.2 01/02/2022 0000   RDW 13.9 01/02/2022 0000   RDW 17.5 (H) 10/11/2020 1041   LYMPHSABS 1,448 02/14/2021 0000   LYMPHSABS 1.7 10/11/2020 1041   MONOABS 0.3 02/11/2021 1516   EOSABS 141 02/14/2021 0000   EOSABS 0.2 10/11/2020 1041   BASOSABS 22 02/14/2021 0000   BASOSABS 0.0 10/11/2020 1041    No results found for: "POCLITH", "LITHIUM"   No results found for: "PHENYTOIN", "PHENOBARB", "VALPROATE", "CBMZ"   .res Assessment: Plan:   Plan:  PDMP reviewed  Risperdal 1mg  at hs  Lamictal 200mg  daily Zoloft 100mg  - 2 daily Nuvigil 200mg  daily - take every day Clonazepam 1mg  Hydroxyzine 25mg  TID - using 1 tablet at bedtime.    Time spent with patient was 25 minutes. Greater than 50% of face to face time with patient was spent  on counseling and coordination of care.      [facility] - therapist  RTC 4 weeks  Patient advised to contact office with any questions, adverse effects, or acute worsening in signs and symptoms.  Counseled patient regarding potential benefits, risks,  and side effects of Lamictal to include potential risk of Stevens-Johnson syndrome. Advised patient to stop taking Lamictal and contact office immediately if rash develops and to seek urgent medical attention if rash is severe and/or spreading quickly.   Discussed potential benefits, risk, and side effects of benzodiazepines to include potential risk of tolerance and dependence, as well as possible drowsiness. Advised patient not to drive if experiencing drowsiness and to take lowest possible effective dose to minimize risk of dependence and tolerance.  Discussed potential metabolic side effects associated with atypical antipsychotics, as well as potential risk for movement side effects. Advised pt to contact office if movement side effects occur.   There are no diagnoses linked to this encounter.   Please see After Visit Summary for patient specific instructions.  No future appointments.  No orders of the defined types were placed in this encounter.     -------------------------------

## 2022-01-31 DIAGNOSIS — F3181 Bipolar II disorder: Secondary | ICD-10-CM | POA: Diagnosis not present

## 2022-02-12 DIAGNOSIS — F3181 Bipolar II disorder: Secondary | ICD-10-CM | POA: Diagnosis not present

## 2022-02-17 ENCOUNTER — Other Ambulatory Visit: Payer: Self-pay | Admitting: Family Medicine

## 2022-02-17 DIAGNOSIS — M1712 Unilateral primary osteoarthritis, left knee: Secondary | ICD-10-CM

## 2022-02-18 ENCOUNTER — Other Ambulatory Visit (HOSPITAL_COMMUNITY): Payer: Self-pay

## 2022-02-18 MED ORDER — LIDOCAINE 5 % EX OINT
1.0000 | TOPICAL_OINTMENT | Freq: Three times a day (TID) | CUTANEOUS | 0 refills | Status: AC | PRN
  Filled 2022-02-18: qty 35.44, 12d supply, fill #0

## 2022-02-18 MED ORDER — MELOXICAM 15 MG PO TABS
ORAL_TABLET | ORAL | 0 refills | Status: DC
  Filled 2022-02-18: qty 30, 30d supply, fill #0

## 2022-02-19 ENCOUNTER — Other Ambulatory Visit (HOSPITAL_COMMUNITY): Payer: Self-pay

## 2022-02-20 ENCOUNTER — Telehealth: Payer: Self-pay

## 2022-02-20 ENCOUNTER — Other Ambulatory Visit (HOSPITAL_COMMUNITY): Payer: Self-pay

## 2022-02-20 NOTE — Telephone Encounter (Signed)
Noted. Ty!

## 2022-02-20 NOTE — Telephone Encounter (Signed)
Patient notified

## 2022-02-20 NOTE — Telephone Encounter (Signed)
Prior Authorization submitted and approved for NUVIGIL 200 MG effective 07/17/202-02/18/2023 with [employer].

## 2022-02-21 ENCOUNTER — Other Ambulatory Visit (HOSPITAL_COMMUNITY): Payer: Self-pay

## 2022-02-21 DIAGNOSIS — F3181 Bipolar II disorder: Secondary | ICD-10-CM | POA: Diagnosis not present

## 2022-02-22 ENCOUNTER — Other Ambulatory Visit (HOSPITAL_COMMUNITY): Payer: Self-pay

## 2022-02-22 ENCOUNTER — Encounter: Payer: Self-pay | Admitting: Family Medicine

## 2022-02-25 ENCOUNTER — Other Ambulatory Visit (HOSPITAL_COMMUNITY): Payer: Self-pay

## 2022-02-25 ENCOUNTER — Other Ambulatory Visit: Payer: Self-pay

## 2022-02-25 MED ORDER — BUTALBITAL-APAP-CAFFEINE 50-325-40 MG PO TABS
1.0000 | ORAL_TABLET | Freq: Four times a day (QID) | ORAL | 0 refills | Status: DC | PRN
Start: 2022-02-25 — End: 2022-10-17

## 2022-02-25 MED ORDER — TIRZEPATIDE 7.5 MG/0.5ML ~~LOC~~ SOAJ
7.5000 mg | SUBCUTANEOUS | 0 refills | Status: AC
  Filled 2022-02-25: qty 2, 28d supply, fill #0

## 2022-02-25 NOTE — Telephone Encounter (Signed)
7.'5mg'$  strength sent in.

## 2022-02-28 DIAGNOSIS — G4733 Obstructive sleep apnea (adult) (pediatric): Secondary | ICD-10-CM | POA: Diagnosis not present

## 2022-03-05 ENCOUNTER — Ambulatory Visit: Payer: 59 | Admitting: Sports Medicine

## 2022-03-07 ENCOUNTER — Other Ambulatory Visit (HOSPITAL_COMMUNITY): Payer: Self-pay

## 2022-03-07 ENCOUNTER — Encounter (INDEPENDENT_AMBULATORY_CARE_PROVIDER_SITE_OTHER): Payer: Self-pay

## 2022-03-07 MED ORDER — NYSTATIN 100000 UNIT/ML MT SUSP
OROMUCOSAL | 0 refills | Status: DC
  Filled 2022-03-07: qty 200, 10d supply, fill #0

## 2022-03-08 ENCOUNTER — Ambulatory Visit (INDEPENDENT_AMBULATORY_CARE_PROVIDER_SITE_OTHER): Payer: 59 | Admitting: Sports Medicine

## 2022-03-08 ENCOUNTER — Ambulatory Visit (INDEPENDENT_AMBULATORY_CARE_PROVIDER_SITE_OTHER): Payer: 59

## 2022-03-08 DIAGNOSIS — M1712 Unilateral primary osteoarthritis, left knee: Secondary | ICD-10-CM

## 2022-03-08 NOTE — Progress Notes (Signed)
    Procedures performed today:    Procedure: Real-time Ultrasound Guided injection of the left knee Device: Samsung HS60  Verbal informed consent obtained.  Time-out conducted.  Noted no overlying erythema, induration, or other signs of local infection.  Skin prepped in a sterile fashion.  Local anesthesia: Topical Ethyl chloride.  With sterile technique and under real time ultrasound guidance: Mild effusion noted, 1 cc Kenalog 40, 2 cc lidocaine, 2 cc bupivacaine injected easily Completed without difficulty  Advised to call if fevers/chills, erythema, induration, drainage, or persistent bleeding.  Images permanently stored and available for review in PACS.  Impression: Technically successful ultrasound guided injection.  Independent interpretation of notes and tests performed by another provider:   None.  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 The last injection brought her blood sugars up to 500, this necessitated an ED visit and sliding scale insulin, her blood sugars are far better controlled now with high doses of [place] and Mounjaro. We did discuss potentially transitioning straight to viscosupplementation but I think we can try the steroid again, if insufficient improvement we will get her approved for viscosupplementation.  I would definitely recommend a gradual titration up to the maximum dose of Mounjaro to minimize her insulin use.  Chronic process with exacerbation and pharmacologic intervention  ____________________________________________ [name], M.D., ABFM., CAQSM., [name]. Primary Care and Sports Medicine [facility]  Adjunct Professor of [employer] [facility]  of Medicine  Risk manager

## 2022-03-08 NOTE — Assessment & Plan Note (Addendum)
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 The last injection brought her blood sugars up to 500, this necessitated an ED visit and sliding scale insulin, her blood sugars are far better controlled now with high doses of [place] and Mounjaro. We did discuss potentially transitioning straight to viscosupplementation but I think we can try the steroid again, if insufficient improvement we will get her approved for viscosupplementation.  I would definitely recommend a gradual titration up to the maximum dose of Mounjaro to minimize her insulin use.

## 2022-03-13 ENCOUNTER — Encounter (INDEPENDENT_AMBULATORY_CARE_PROVIDER_SITE_OTHER): Payer: Self-pay

## 2022-03-15 DIAGNOSIS — F3181 Bipolar II disorder: Secondary | ICD-10-CM | POA: Diagnosis not present

## 2022-03-21 DIAGNOSIS — K117 Disturbances of salivary secretion: Secondary | ICD-10-CM | POA: Diagnosis not present

## 2022-03-21 DIAGNOSIS — L438 Other lichen planus: Secondary | ICD-10-CM | POA: Diagnosis not present

## 2022-03-25 ENCOUNTER — Other Ambulatory Visit (HOSPITAL_COMMUNITY): Payer: Self-pay

## 2022-03-25 MED ORDER — CLOTRIMAZOLE 10 MG MT TROC
OROMUCOSAL | 2 refills | Status: DC
  Filled 2022-03-25: qty 70, 35d supply, fill #0

## 2022-03-25 MED ORDER — CLOBETASOL PROPIONATE 0.05 % EX GEL
CUTANEOUS | 2 refills | Status: DC
  Filled 2022-03-25: qty 30, 30d supply, fill #0

## 2022-03-26 ENCOUNTER — Other Ambulatory Visit (HOSPITAL_COMMUNITY): Payer: Self-pay

## 2022-04-01 ENCOUNTER — Other Ambulatory Visit: Payer: Self-pay | Admitting: Family Medicine

## 2022-04-01 ENCOUNTER — Other Ambulatory Visit (HOSPITAL_COMMUNITY): Payer: Self-pay

## 2022-04-01 ENCOUNTER — Encounter: Payer: Self-pay | Admitting: Family Medicine

## 2022-04-01 DIAGNOSIS — E1165 Type 2 diabetes mellitus with hyperglycemia: Secondary | ICD-10-CM

## 2022-04-02 ENCOUNTER — Other Ambulatory Visit (HOSPITAL_COMMUNITY): Payer: Self-pay

## 2022-04-02 MED ORDER — DEXCOM G6 SENSOR MISC
1 refills | Status: DC
  Filled 2022-04-02: qty 3, 30d supply, fill #0
  Filled 2022-06-14: qty 3, 30d supply, fill #1
  Filled 2022-07-29: qty 3, 30d supply, fill #2

## 2022-04-02 MED ORDER — TIRZEPATIDE 10 MG/0.5ML ~~LOC~~ SOAJ
10.0000 mg | SUBCUTANEOUS | 1 refills | Status: DC
  Filled 2022-04-02: qty 2, 28d supply, fill #0

## 2022-04-03 ENCOUNTER — Other Ambulatory Visit (HOSPITAL_COMMUNITY): Payer: Self-pay

## 2022-04-09 DIAGNOSIS — F3181 Bipolar II disorder: Secondary | ICD-10-CM | POA: Diagnosis not present

## 2022-04-11 ENCOUNTER — Other Ambulatory Visit (HOSPITAL_COMMUNITY): Payer: Self-pay

## 2022-04-18 DIAGNOSIS — K117 Disturbances of salivary secretion: Secondary | ICD-10-CM | POA: Diagnosis not present

## 2022-04-18 DIAGNOSIS — L438 Other lichen planus: Secondary | ICD-10-CM | POA: Diagnosis not present

## 2022-04-19 ENCOUNTER — Ambulatory Visit: Payer: 59 | Admitting: Sports Medicine

## 2022-04-25 ENCOUNTER — Other Ambulatory Visit: Payer: Self-pay | Admitting: Family Medicine

## 2022-04-25 ENCOUNTER — Other Ambulatory Visit (HOSPITAL_COMMUNITY): Payer: Self-pay

## 2022-04-26 ENCOUNTER — Other Ambulatory Visit (HOSPITAL_COMMUNITY): Payer: Self-pay

## 2022-04-26 MED ORDER — LISINOPRIL 10 MG PO TABS
10.0000 mg | ORAL_TABLET | Freq: Every day | ORAL | 3 refills | Status: DC
  Filled 2022-04-26: qty 90, 90d supply, fill #0
  Filled 2022-07-31: qty 90, 90d supply, fill #1
  Filled 2023-01-12: qty 90, 90d supply, fill #2
  Filled 2023-04-15: qty 90, 90d supply, fill #3

## 2022-04-26 MED ORDER — ROSUVASTATIN CALCIUM 20 MG PO TABS
ORAL_TABLET | Freq: Every day | ORAL | 3 refills | Status: DC
Start: 2022-04-26 — End: 2023-12-09
  Filled 2022-04-26: qty 90, 90d supply, fill #0
  Filled 2022-08-07: qty 90, 90d supply, fill #1

## 2022-04-26 MED ORDER — SYNTHROID 300 MCG PO TABS
ORAL_TABLET | ORAL | 1 refills | Status: DC
  Filled 2022-04-26: qty 90, fill #0
  Filled 2022-06-26: qty 90, 90d supply, fill #0
  Filled 2022-09-24: qty 90, 90d supply, fill #1

## 2022-04-29 ENCOUNTER — Other Ambulatory Visit: Payer: Self-pay | Admitting: Adult Health

## 2022-04-29 ENCOUNTER — Encounter (INDEPENDENT_AMBULATORY_CARE_PROVIDER_SITE_OTHER): Payer: 59 | Admitting: Family Medicine

## 2022-04-29 ENCOUNTER — Other Ambulatory Visit (HOSPITAL_COMMUNITY): Payer: Self-pay

## 2022-04-29 ENCOUNTER — Other Ambulatory Visit: Payer: Self-pay | Admitting: Family Medicine

## 2022-04-29 ENCOUNTER — Encounter: Payer: Self-pay | Admitting: Family Medicine

## 2022-04-29 DIAGNOSIS — M542 Cervicalgia: Secondary | ICD-10-CM

## 2022-04-29 DIAGNOSIS — M1712 Unilateral primary osteoarthritis, left knee: Secondary | ICD-10-CM

## 2022-04-29 DIAGNOSIS — F410 Panic disorder [episodic paroxysmal anxiety] without agoraphobia: Secondary | ICD-10-CM

## 2022-04-29 MED ORDER — NAPROXEN 500 MG PO TABS
500.0000 mg | ORAL_TABLET | Freq: Two times a day (BID) | ORAL | 0 refills | Status: DC
  Filled 2022-04-29: qty 30, 15d supply, fill #0

## 2022-04-29 MED ORDER — CYCLOBENZAPRINE HCL 10 MG PO TABS
10.0000 mg | ORAL_TABLET | Freq: Three times a day (TID) | ORAL | 0 refills | Status: DC | PRN
  Filled 2022-04-29: qty 30, 10d supply, fill #0

## 2022-04-29 MED ORDER — MELOXICAM 15 MG PO TABS
ORAL_TABLET | ORAL | 0 refills | Status: DC
  Filled 2022-04-29 – 2022-07-29 (×2): qty 30, 30d supply, fill #0

## 2022-04-29 MED ORDER — PANTOPRAZOLE SODIUM 40 MG PO TBEC
80.0000 mg | DELAYED_RELEASE_TABLET | Freq: Two times a day (BID) | ORAL | 3 refills | Status: DC
  Filled 2022-04-29 – 2022-05-17 (×2): qty 60, 15d supply, fill #0
  Filled 2022-06-12: qty 60, 15d supply, fill #1
  Filled 2022-07-05: qty 60, 15d supply, fill #2
  Filled 2022-07-29: qty 60, 15d supply, fill #3

## 2022-04-29 NOTE — Telephone Encounter (Signed)
Please see the MyChart message reply(ies) for my assessment and plan.    This patient gave consent for this Medical Advice Message and is aware that it may result in a bill to [employer], as well as the possibility of receiving a bill for a co-payment or deductible. They are an established patient, but are not seeking medical advice exclusively about a problem treated during an in person or video visit in the last seven days. I did not recommend an in person or video visit within seven days of my reply.    I spent a total of 7 minutes cumulative time within 7 days through [employer].  Luetta Nutting, DO

## 2022-04-30 DIAGNOSIS — F3181 Bipolar II disorder: Secondary | ICD-10-CM | POA: Diagnosis not present

## 2022-05-04 ENCOUNTER — Other Ambulatory Visit (HOSPITAL_COMMUNITY): Payer: Self-pay

## 2022-05-06 ENCOUNTER — Other Ambulatory Visit (HOSPITAL_COMMUNITY): Payer: Self-pay

## 2022-05-06 ENCOUNTER — Other Ambulatory Visit: Payer: Self-pay | Admitting: Family Medicine

## 2022-05-07 ENCOUNTER — Other Ambulatory Visit (HOSPITAL_COMMUNITY): Payer: Self-pay

## 2022-05-07 MED ORDER — INSULIN LISPRO (1 UNIT DIAL) 100 UNIT/ML (KWIKPEN)
PEN_INJECTOR | SUBCUTANEOUS | 11 refills | Status: DC
  Filled 2022-05-07: qty 15, 30d supply, fill #0

## 2022-05-09 DIAGNOSIS — M4322 Fusion of spine, cervical region: Secondary | ICD-10-CM | POA: Diagnosis not present

## 2022-05-09 DIAGNOSIS — M25512 Pain in left shoulder: Secondary | ICD-10-CM | POA: Diagnosis not present

## 2022-05-17 ENCOUNTER — Other Ambulatory Visit (HOSPITAL_COMMUNITY): Payer: Self-pay

## 2022-05-17 ENCOUNTER — Encounter: Payer: Self-pay | Admitting: Family Medicine

## 2022-05-17 MED ORDER — TIRZEPATIDE 12.5 MG/0.5ML ~~LOC~~ SOAJ
12.5000 mg | SUBCUTANEOUS | 1 refills | Status: DC
  Filled 2022-05-17: qty 2, 28d supply, fill #0

## 2022-05-17 NOTE — Telephone Encounter (Signed)
Completed.

## 2022-05-20 ENCOUNTER — Other Ambulatory Visit (HOSPITAL_COMMUNITY): Payer: Self-pay

## 2022-05-21 DIAGNOSIS — F3181 Bipolar II disorder: Secondary | ICD-10-CM | POA: Diagnosis not present

## 2022-05-29 DIAGNOSIS — G4733 Obstructive sleep apnea (adult) (pediatric): Secondary | ICD-10-CM | POA: Diagnosis not present

## 2022-05-30 ENCOUNTER — Encounter: Payer: Self-pay | Admitting: Family Medicine

## 2022-06-01 IMAGING — DX DG CHEST 1V PORT
1 series · 1 of 1 positions shown · non-contrast
Comparison: 02/10/2021

CLINICAL DATA: Shortness of breath.

EXAM:
PORTABLE CHEST 1 VIEW

[chest ap]
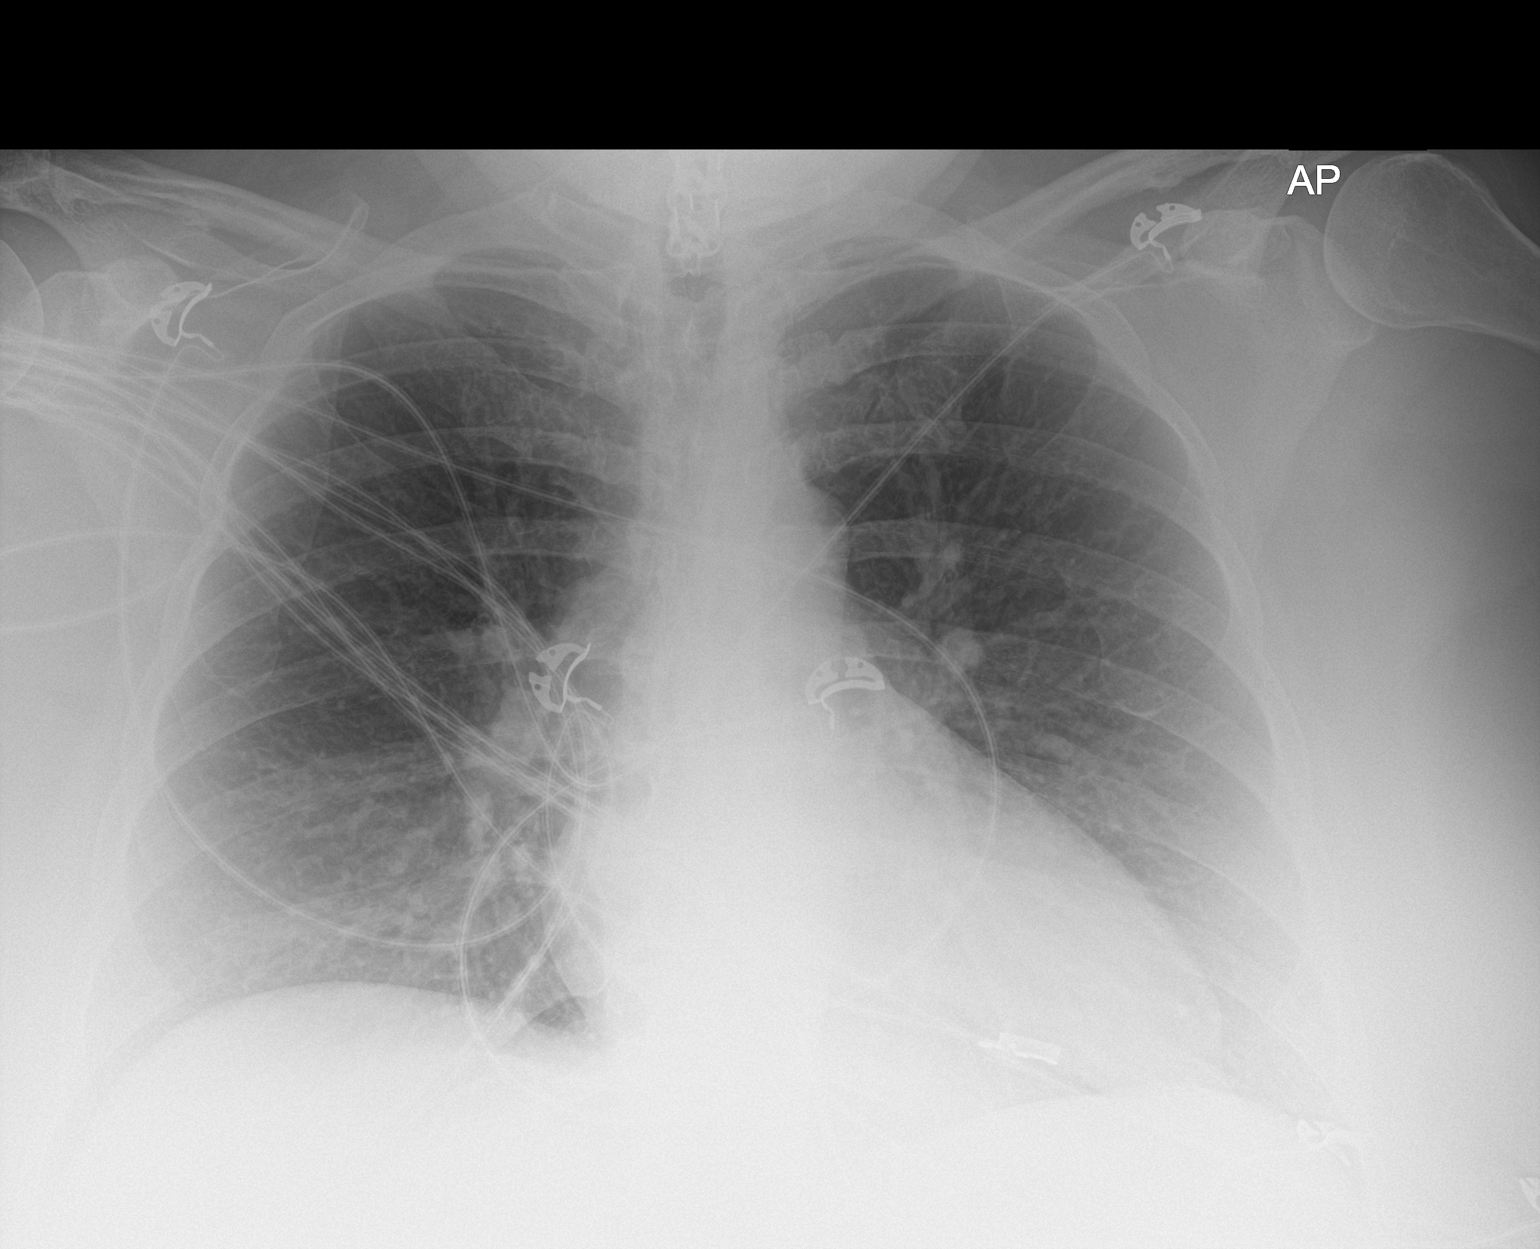

[1 of 1 positions shown; findings below may reference images not displayed]

FINDINGS: Normal sized heart. Clear lungs with normal vascularity. Cervical
spine fixation hardware.
IMPRESSION: No active disease.

## 2022-06-06 DIAGNOSIS — F3181 Bipolar II disorder: Secondary | ICD-10-CM | POA: Diagnosis not present

## 2022-06-12 ENCOUNTER — Other Ambulatory Visit: Payer: Self-pay | Admitting: Adult Health

## 2022-06-12 ENCOUNTER — Other Ambulatory Visit (HOSPITAL_COMMUNITY): Payer: Self-pay

## 2022-06-12 DIAGNOSIS — F410 Panic disorder [episodic paroxysmal anxiety] without agoraphobia: Secondary | ICD-10-CM

## 2022-06-13 ENCOUNTER — Other Ambulatory Visit (INDEPENDENT_AMBULATORY_CARE_PROVIDER_SITE_OTHER): Payer: Self-pay | Admitting: Family Medicine

## 2022-06-13 DIAGNOSIS — E538 Deficiency of other specified B group vitamins: Secondary | ICD-10-CM

## 2022-06-13 NOTE — Telephone Encounter (Signed)
Please call to schedule an appt

## 2022-06-14 ENCOUNTER — Other Ambulatory Visit (HOSPITAL_COMMUNITY): Payer: Self-pay

## 2022-06-14 ENCOUNTER — Other Ambulatory Visit: Payer: Self-pay | Admitting: Family Medicine

## 2022-06-14 DIAGNOSIS — E1165 Type 2 diabetes mellitus with hyperglycemia: Secondary | ICD-10-CM

## 2022-06-14 MED ORDER — FOLIC ACID 1 MG PO TABS
1.0000 mg | ORAL_TABLET | Freq: Every day | ORAL | 0 refills | Status: DC
  Filled 2022-06-14: qty 90, 90d supply, fill #0

## 2022-06-17 ENCOUNTER — Other Ambulatory Visit (HOSPITAL_COMMUNITY): Payer: Self-pay

## 2022-06-17 MED ORDER — DEXCOM G6 TRANSMITTER MISC
3 refills | Status: DC
  Filled 2022-06-17: qty 1, 90d supply, fill #0
  Filled 2022-09-30: qty 1, 90d supply, fill #1

## 2022-06-17 NOTE — Telephone Encounter (Signed)
Lvm to schedule appt

## 2022-06-18 DIAGNOSIS — F3181 Bipolar II disorder: Secondary | ICD-10-CM | POA: Diagnosis not present

## 2022-06-19 ENCOUNTER — Other Ambulatory Visit (HOSPITAL_COMMUNITY): Payer: Self-pay

## 2022-06-19 MED ORDER — CLONAZEPAM 1 MG PO TABS
1.0000 mg | ORAL_TABLET | Freq: Three times a day (TID) | ORAL | 0 refills | Status: DC
  Filled 2022-06-19: qty 15, 5d supply, fill #0

## 2022-06-24 ENCOUNTER — Encounter: Payer: Self-pay | Admitting: Family Medicine

## 2022-06-24 ENCOUNTER — Telehealth (INDEPENDENT_AMBULATORY_CARE_PROVIDER_SITE_OTHER): Payer: 59 | Admitting: Adult Health

## 2022-06-24 ENCOUNTER — Encounter: Payer: Self-pay | Admitting: Adult Health

## 2022-06-24 ENCOUNTER — Other Ambulatory Visit: Payer: Self-pay | Admitting: Family Medicine

## 2022-06-24 ENCOUNTER — Other Ambulatory Visit (HOSPITAL_COMMUNITY): Payer: Self-pay

## 2022-06-24 DIAGNOSIS — F331 Major depressive disorder, recurrent, moderate: Secondary | ICD-10-CM

## 2022-06-24 DIAGNOSIS — F411 Generalized anxiety disorder: Secondary | ICD-10-CM | POA: Diagnosis not present

## 2022-06-24 DIAGNOSIS — F339 Major depressive disorder, recurrent, unspecified: Secondary | ICD-10-CM

## 2022-06-24 DIAGNOSIS — G4733 Obstructive sleep apnea (adult) (pediatric): Secondary | ICD-10-CM

## 2022-06-24 DIAGNOSIS — F4310 Post-traumatic stress disorder, unspecified: Secondary | ICD-10-CM | POA: Diagnosis not present

## 2022-06-24 DIAGNOSIS — F603 Borderline personality disorder: Secondary | ICD-10-CM

## 2022-06-24 DIAGNOSIS — F410 Panic disorder [episodic paroxysmal anxiety] without agoraphobia: Secondary | ICD-10-CM

## 2022-06-24 DIAGNOSIS — F332 Major depressive disorder, recurrent severe without psychotic features: Secondary | ICD-10-CM | POA: Diagnosis not present

## 2022-06-24 MED ORDER — ARMODAFINIL 200 MG PO TABS
200.0000 mg | ORAL_TABLET | Freq: Every day | ORAL | 2 refills | Status: DC | PRN
  Filled 2022-06-24: qty 30, 30d supply, fill #0
  Filled 2022-09-11: qty 30, 30d supply, fill #1
  Filled 2022-11-29: qty 30, 30d supply, fill #2

## 2022-06-24 MED ORDER — CLONAZEPAM 1 MG PO TABS
1.0000 mg | ORAL_TABLET | Freq: Three times a day (TID) | ORAL | 2 refills | Status: DC
  Filled 2022-06-24: qty 90, 30d supply, fill #0
  Filled 2022-07-29: qty 90, 30d supply, fill #1
  Filled 2022-10-18: qty 90, 30d supply, fill #2

## 2022-06-24 MED ORDER — HYDROXYZINE HCL 25 MG PO TABS
25.0000 mg | ORAL_TABLET | Freq: Three times a day (TID) | ORAL | 3 refills | Status: DC | PRN
  Filled 2022-06-24: qty 90, 30d supply, fill #0
  Filled 2023-03-13: qty 90, 30d supply, fill #1

## 2022-06-24 MED ORDER — RISPERIDONE 1 MG PO TABS
1.0000 mg | ORAL_TABLET | Freq: Every day | ORAL | 1 refills | Status: DC
  Filled 2022-06-24: qty 90, 90d supply, fill #0

## 2022-06-24 MED ORDER — LAMOTRIGINE 200 MG PO TABS
400.0000 mg | ORAL_TABLET | Freq: Every day | ORAL | 0 refills | Status: DC
  Filled 2022-06-24: qty 180, 90d supply, fill #0

## 2022-06-24 MED ORDER — TIRZEPATIDE 15 MG/0.5ML ~~LOC~~ SOAJ
15.0000 mg | SUBCUTANEOUS | 1 refills | Status: DC
  Filled 2022-06-24: qty 2, 28d supply, fill #0
  Filled 2022-07-02 – 2022-07-29 (×2): qty 2, 28d supply, fill #1

## 2022-06-24 MED ORDER — SERTRALINE HCL 100 MG PO TABS
200.0000 mg | ORAL_TABLET | Freq: Every morning | ORAL | 3 refills | Status: DC
  Filled 2022-06-24: qty 180, fill #0
  Filled 2022-06-26 – 2022-08-07 (×3): qty 180, 90d supply, fill #0
  Filled 2022-12-13: qty 180, 90d supply, fill #1
  Filled 2023-03-31: qty 180, 90d supply, fill #2

## 2022-06-24 NOTE — Progress Notes (Signed)
Tracy [name] [id number] 04/17/1966 56 y.o.  Virtual Visit via Video Note  I connected with pt @ on 06/24/22 at  5:20 PM EST by a video enabled telemedicine application and verified that I am speaking with the correct person using two identifiers.   I discussed the limitations of evaluation and management by telemedicine and the availability of in person appointments. The patient expressed understanding and agreed to proceed.  I discussed the assessment and treatment plan with the patient. The patient was provided an opportunity to ask questions and all were answered. The patient agreed with the plan and demonstrated an understanding of the instructions.   The patient was advised to call back or seek an in-person evaluation if the symptoms worsen or if the condition fails to improve as anticipated.  I provided 25 minutes of non-face-to-face time during this encounter.  The patient was located at home.  The provider was located at [place].   Tracy Gell, NP   Subjective:   Patient ID:  Tracy [name] is a 56 y.o. (DOB March 02, 1966) female.  Chief Complaint: No chief complaint on file.   HPI Tracy [name] presents for follow-up of MDD, GAD, PTSD, and Panic attacks.  Describes mood today as "ok". Pleasant. Flat. Decreased tearfulness. Mood symptoms - reports depression, anxiety and irritability. Reports worry and rumination. Reports 2 recent panic attacks. Reports mood has been lower. Stating "I've been depressed - but I shouldn't be". Divorced. Has bought a house and moved. Has good friends and support. Seeing therapist regularly. Decreased interest and motivation. Taking medications as prescribed. Energy levels lower. Active, does not have a regular exercise routine. Walking dogs. Enjoys some usual interests and activities. Lives alone with 3 dogs (shares with ex). Recently divorced. Siblings local. Spending time with family. Appetite adequate. Weight stable - 300  pounds.  Sleeps well most nights. Averages 4 to 5 hours. Sleep apnea - taking Nuvigil in the morning.  Denies SI or HI. Denies AH or VH.  Working with [employer] - bi-weekly.  Previous medication trials:  Antipsychotics: Vraylar-TD, Latuda, Abilify, Geodon, Rexulti, Lybalvi, Risperdal  Mood Stabilizers - Depakote, Lithium, Lamictal, Equetro, Topamax, Trileptal, Gabapentin  SSRI - Zoloft, Lexapro, Celexa, Prozac, Viibryd, Trintellix, Prozac  SNRI - Pristiq, Effexor, Cymbalta  Wellbutrin - allergic reaction  Anti-anxiety - Buspar, Xanax, Clonazepam, Ativan, Valium  Sleep agents - Trazadone, Ambien, Restoril   Other - Deplin, Emsam, Serzone, Doxepin, Nuvigil, Vistaril  Previous treatment: ECT and TMS    Review of Systems:  Review of Systems  Musculoskeletal:  Negative for gait problem.  Neurological:  Negative for tremors.  Psychiatric/Behavioral:         Please refer to HPI    Medications: I have reviewed the patient's current medications.  Current Outpatient Medications  Medication Sig Dispense Refill   albuterol (VENTOLIN HFA) 108 (90 Base) MCG/ACT inhaler Inhale 2 puffs into the lungs every 6 (six) hours as needed for wheezing or shortness of breath. 8 g 3   Armodafinil 200 MG TABS Take 1 Tablet by mouth daily as needed (sleepiness). 30 tablet 2   Blood Glucose Monitoring Suppl (FREESTYLE LITE) w/Device KIT Use as directed. 1 kit 0   Blood Glucose Monitoring Suppl (FREESTYLE LITE) w/Device KIT Check fasting blood sugar every morning and before meals up to 4 times daily. 1 kit PRN   butalbital-acetaminophen-caffeine (FIORICET) 50-325-40 MG tablet Take 1-2 tablets by mouth every 6 (six) hours as needed for headache. 20 tablet 0  clobetasol (TEMOVATE) 0.05 % GEL Apply small amount to affected area twice daily using finger. Gently pat dry with cotton gauze.  Do not eat,drink, or speak for 30 minutes to allow absorption. 30 g 2   clonazePAM (KLONOPIN) 1 MG tablet Take 1  tablet (1 mg total) by mouth 3 (three) times daily. 15 tablet 0   clotrimazole (MYCELEX) 10 MG troche Dissolve 1 tablet by mouth 2 times a day. Dissolve tablet slowly and completely on days you use topical steroid. 70 Troche 2   Continuous Blood Gluc Sensor (DEXCOM G6 SENSOR) MISC Replace every 10 days.  Use to check blood sugar. 9 each 1   Continuous Blood Gluc Transmit (DEXCOM G6 TRANSMITTER) MISC Use to check continuous blood sugar.  Replace every 90 days 1 each 3   cyclobenzaprine (FLEXERIL) 10 MG tablet Take 1 tablet (10 mg total) by mouth 3 (three) times daily as needed for muscle spasms. 30 tablet 0   ferrous sulfate 325 (65 FE) MG EC tablet Take 1 tablet (325 mg total) by mouth in the morning and at bedtime. Take iron supplement 2 hours before or 4 hours after PPI or other antacids as this requires some level of gastric acidity to aid in absorption 60 tablet 5   folic acid (FOLVITE) 1 MG tablet Take 1 tablet by mouth daily. 90 tablet 0   furosemide (LASIX) 40 MG tablet TAKE 1 TABLET BY MOUTH DAILY. 30 tablet 3   glucose blood (FREESTYLE LITE) test strip Use as instructed 100 each 12   glucose blood (FREESTYLE LITE) test strip Check fasting blood sugar every morning and before meals up to 4 times a day as directed. 100 strip 12   hydrOXYzine (ATARAX) 25 MG tablet Take 1 tablet (25 mg total) by mouth 3 (three) times daily as needed for anxiety. 90 tablet 3   insulin degludec (TRESIBA FLEXTOUCH) 100 UNIT/ML FlexTouch Pen Start 43 units twice a day, increase by 3 units every time blood sugar prior to injection is above 120     insulin lispro (HUMALOG KWIKPEN) 100 UNIT/ML KwikPen Inject up to 16 units 3 times daily at mealtime per sliding scale. 15 mL 11   Insulin Pen Needle (UNIFINE PENTIPS) 31G X 5 MM MISC Use as directed to inject insulin 100 each 3   lamoTRIgine (LAMICTAL) 200 MG tablet Take 2 tablets by mouth at bedtime. 180 tablet 0   Lancets 30G MISC Check fasting blood sugar every morning  and before meals up to 4 times daily. 100 each 12   lidocaine (XYLOCAINE) 5 % ointment Apply topically to affected areas 3 times daily as needed. 35.44 g 0   lisinopril (ZESTRIL) 10 MG tablet Take 1 tablet (10 mg total) by mouth daily. 90 tablet 3   meloxicam (MOBIC) 15 MG tablet Take 1 tablet by mouth once every morning with a meal for 2 weeks, then daily as needed for pain. 30 tablet 0   naproxen (NAPROSYN) 500 MG tablet Take 1 tablet (500 mg total) by mouth 2 (two) times daily with a meal. 30 tablet 0   neomycin-bacitracin-polymyxin 3.5-6787743142 OINT Apply topically 2 (two) times daily for 10 days To corners of mouth 14 g 2   nystatin (MYCOSTATIN) 100000 UNIT/ML suspension Use 57m to swish and swallow 4  times daily for 10 days 200 mL 0   nystatin (NYAMYC) powder Apply 3 times daily as directed. 60 g 3   nystatin ointment (MYCOSTATIN) Apply to the skin 2 times daily to  corners of mouth 15 g 2   ondansetron (ZOFRAN-ODT) 4 MG disintegrating tablet Dissolve 1 tablet (4 mg total) by mouth 2 (two) times daily as needed for nausea or vomiting. 20 tablet 1   pantoprazole (PROTONIX) 40 MG tablet Take 2 tablets (80 mg total) by mouth 2 (two) times daily. 60 tablet 3   pimecrolimus (ELIDEL) 1 % cream Apply a small amount to affected area 2 x daily. Gently pat area dry w/cotton gauze. No eating, drinking or speaking for 20 - 30 minutes to allow medicine to be absorbed. Can apply to 2x2 gauze & placed against the affected area. 60 g 3   risperiDONE (RISPERDAL) 1 MG tablet Take 1 tablet (1 mg total) by mouth at bedtime. 90 tablet 1   rosuvastatin (CRESTOR) 20 MG tablet TAKE 1 TABLET BY MOUTH DAILY. 90 tablet 3   sertraline (ZOLOFT) 100 MG tablet TAKE 2 TABLETS BY MOUTH EVERY MORNING 180 tablet 3   spironolactone (ALDACTONE) 50 MG tablet Take 1 tablet (50 mg total) by mouth 2 (two) times daily. 180 tablet 2   SYNTHROID 300 MCG tablet TAKE 1 TABLET BY MOUTH ONCE A DAY BEFORE BREAKFAST 90 tablet 1   tirzepatide  (MOUNJARO) 15 MG/0.5ML Pen Inject 15 mg into the skin once a week. 6 mL 1   Vitamin D, Ergocalciferol, (DRISDOL) 1.25 MG (50000 UNIT) CAPS capsule TAKE 1 CAPSULE BY MOUTH EVERY 7 DAYS. 12 capsule 1   No current facility-administered medications for this visit.    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   Carpal tunnel syndrome 09/06/2015   Chewing difficulty    Chronic left shoulder pain 05/17/2020   Constipation    Coronary artery disease 03/21/2021   COVID 10/08/2021   Cystic teratoma    BENIGN   Dental crowns present    Diabetes mellitus without complication ([place])    Diabetes mellitus, type 2 ([place]) 06/10/2013   Diarrhea    Diverticulosis of colon without hemorrhage    DOE (dyspnea on exertion) 03/21/2021   Dyspepsia 12/14/2020   Essential hypertension 10/02/2007   Qualifier: Diagnosis of  By: Loanne Drilling MD, Hilliard Clark A    Facial rash 06/16/2019   Female hirsutism 02/09/2015   Food allergy    Walnuts   GAD (generalized anxiety disorder)    Gastritis and gastroduodenitis    Gastroesophageal reflux disease    Grade II internal hemorrhoids    Greater trochanteric bursitis of left hip 07/24/2018   Hemorrhoids, internal 07/18/2017   History of kidney stones    History of migraine headaches    History of posttraumatic stress disorder (PTSD)    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/2015   Hypothyroidism    HYPOTHYROIDISM, POSTSURGICAL 10/02/2007   Qualifier: Diagnosis of   By: Loanne Drilling MD, Sean A    IBS (irritable bowel syndrome)    Infertility associated with anovulation 02/09/2015   Insulin resistance 02/09/2015  Joint pain    Leg swelling 8/61/0424   Lichen planus    Liver cirrhosis secondary to NASH (nonalcoholic steatohepatitis) (HCC)    Lower back pain 07/18/2017   MDD (major depressive disorder) 06/06/2015   Migraine 10/02/2007   Morbid obesity with BMI of 40.0-44.9, adult ([place]) 06/23/2020   NASH (nonalcoholic steatohepatitis)    Nausea without vomiting    Nonalcoholic fatty liver disease 02/09/2015   Obstructive sleep apnea 06/27/2014   Oral mucosal lesion 06/17/2019   Osteoarthritis    Other fatigue    PCOS (polycystic ovarian syndrome)    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   Qualifier: Diagnosis of  By: Loanne Drilling MD, Sean A    Trigeminal neuralgia    Unilateral primary osteoarthritis, left hip 02/10/2019   Vitamin D deficiency     Family History  Problem Relation Age of Onset   Heart disease Father    Depression Father    Heart attack Father    Sudden death Father    Anxiety disorder Father    Bipolar disorder Father    Obesity Father    Cancer Brother        THROAT cancer   Depression Brother    Anxiety disorder Brother    Depression Sister    Breast cancer Mother    Hypertension Mother    Cancer Mother    Depression Mother     Sleep apnea Mother    Obesity Mother    Depression Maternal Grandmother    Stroke Maternal Grandfather    Colon cancer Brother    Pancreatic cancer Neg Hx    Stomach cancer Neg Hx    Liver disease Neg Hx     Social History   Socioeconomic History   Marital status: Divorced    Spouse name: Not on file   Number of children: 0   Years of education: 16   Highest education level: Not on file  Occupational History   Occupation: RN-Telephone Geophysical data processor: [employer]    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   Drug use: No   Sexual activity: Not Currently    Partners: Male    Birth control/protection: Condom  Other Topics Concern   Not on file  Social History Narrative  RN - [employer]   Regular exercise: no   Caffeine use: 2 x daily   Right-handed.   Lives alone.   Social Determinants of Health   Financial Resource Strain: Not on file  Food Insecurity: Not on file  Transportation Needs: Not on file  Physical Activity: Not on file  Stress: Not on file  Social Connections: Not on file  Intimate Partner Violence: Not on file    Past Medical History, Surgical history, Social history, and Family history were reviewed and updated as appropriate.   Please see review of systems for further details on the patient's review from today.   Objective:   Physical Exam:  LMP 08/24/2020   Physical Exam Constitutional:      General: She is not in acute distress. Musculoskeletal:        General: No deformity.  Neurological:     Mental  Status: She is alert and oriented to person, place, and time.     Coordination: Coordination normal.  Psychiatric:        Attention and Perception: Attention and perception normal. She does not perceive auditory or visual hallucinations.        Mood and Affect: Mood normal. Mood is not anxious or depressed. Affect is not labile, blunt, angry or inappropriate.        Speech: Speech normal.        Behavior: Behavior normal.        Thought Content: Thought content normal. Thought content is not paranoid or delusional. Thought content does not include homicidal or suicidal ideation. Thought content does not include homicidal or suicidal plan.        Cognition and Memory: Cognition and memory normal.        Judgment: Judgment normal.     Comments: Insight intact     Lab Review:     Component Value Date/Time   NA 135 01/02/2022 0000   NA 140 10/11/2020 1041   K 4.2 01/02/2022 0000   CL 102 01/02/2022 0000   CO2 24 01/02/2022 0000   GLUCOSE 299 (H) 01/02/2022 0000   BUN 14 01/02/2022 0000   BUN 14 10/11/2020 1041   CREATININE 0.73 01/02/2022 0000   CALCIUM 9.5 01/02/2022 0000   PROT 6.8 01/02/2022 0000   PROT 7.1 10/11/2020 1041   ALBUMIN 3.8 02/11/2021 1516   ALBUMIN 4.2 10/11/2020 1041   AST 60 (H) 01/02/2022 0000   ALT 54 (H) 01/02/2022 0000   ALKPHOS 172 (H) 02/11/2021 1516   BILITOT 0.5 01/02/2022 0000   BILITOT <0.2 10/11/2020 1041   GFRNONAA >60 12/30/2021 1106   GFRNONAA 99 12/26/2020 0000   GFRAA 115 12/26/2020 0000       Component Value Date/Time   WBC 3.0 (L) 01/02/2022 0000   RBC 4.64 01/02/2022 0000   HGB 14.1 01/02/2022 0000   HGB 10.9 (L) 10/11/2020 1041   HCT 42.5 01/02/2022 0000   HCT 35.6 10/11/2020 1041   PLT 121 (L) 01/02/2022 0000   PLT 224 10/11/2020 1041   MCV 91.6 01/02/2022 0000   MCV 78 (L) 10/11/2020 1041   MCH 30.4 01/02/2022 0000   MCHC 33.2 01/02/2022 0000   RDW 13.9 01/02/2022 0000   RDW 17.5 (H) 10/11/2020 1041   LYMPHSABS 1,448  02/14/2021 0000   LYMPHSABS 1.7 10/11/2020 1041   MONOABS 0.3 02/11/2021 1516   EOSABS 141 02/14/2021 0000   EOSABS 0.2 10/11/2020 1041   BASOSABS 22 02/14/2021 0000   BASOSABS 0.0 10/11/2020 1041  No results found for: "POCLITH", "LITHIUM"   No results found for: "PHENYTOIN", "PHENOBARB", "VALPROATE", "CBMZ"   .res Assessment: Plan:    Plan:  PDMP reviewed  Risperdal 63m at hs  Lamictal 2027mdaily Zoloft 10043m 2 daily Nuvigil 200m86mily - take every day Clonazepam 1mg 41mroxyzine 25mg 12m- using 1 tablet at bedtime.   Time spent with patient was 25 minutes. Greater than 50% of face to face time with patient was spent on counseling and coordination of care.      Cathy Rea Collegerapist  RTC 4 weeks  Patient advised to contact office with any questions, adverse effects, or acute worsening in signs and symptoms.  Counseled patient regarding potential benefits, risks, and side effects of Lamictal to include potential risk of Stevens-Johnson syndrome. Advised patient to stop taking Lamictal and contact office immediately if rash develops and to seek urgent medical attention if rash is severe and/or spreading quickly.   Discussed potential benefits, risk, and side effects of benzodiazepines to include potential risk of tolerance and dependence, as well as possible drowsiness. Advised patient not to drive if experiencing drowsiness and to take lowest possible effective dose to minimize risk of dependence and tolerance.  Discussed potential metabolic side effects associated with atypical antipsychotics, as well as potential risk for movement side effects. Advised pt to contact office if movement side effects occur.    There are no diagnoses linked to this encounter.   Please see After Visit Summary for patient specific instructions.  No future appointments.  No orders of the defined types were placed in this encounter.     -------------------------------

## 2022-06-24 NOTE — Telephone Encounter (Signed)
42m pen sent in.

## 2022-06-25 ENCOUNTER — Other Ambulatory Visit (HOSPITAL_COMMUNITY): Payer: Self-pay

## 2022-06-26 ENCOUNTER — Other Ambulatory Visit (HOSPITAL_COMMUNITY): Payer: Self-pay

## 2022-06-26 ENCOUNTER — Other Ambulatory Visit: Payer: Self-pay | Admitting: Family Medicine

## 2022-06-26 ENCOUNTER — Other Ambulatory Visit: Payer: Self-pay | Admitting: Adult Health

## 2022-06-26 DIAGNOSIS — F332 Major depressive disorder, recurrent severe without psychotic features: Secondary | ICD-10-CM

## 2022-06-26 MED ORDER — SPIRONOLACTONE 50 MG PO TABS
50.0000 mg | ORAL_TABLET | Freq: Two times a day (BID) | ORAL | 2 refills | Status: DC
  Filled 2022-06-26 – 2022-08-07 (×2): qty 180, 90d supply, fill #0
  Filled 2022-11-06: qty 180, 90d supply, fill #1
  Filled 2023-02-09: qty 180, 90d supply, fill #2

## 2022-06-28 ENCOUNTER — Other Ambulatory Visit (HOSPITAL_COMMUNITY): Payer: Self-pay

## 2022-07-02 ENCOUNTER — Other Ambulatory Visit (HOSPITAL_COMMUNITY): Payer: Self-pay

## 2022-07-06 ENCOUNTER — Other Ambulatory Visit (HOSPITAL_COMMUNITY): Payer: Self-pay

## 2022-07-09 DIAGNOSIS — F3181 Bipolar II disorder: Secondary | ICD-10-CM | POA: Diagnosis not present

## 2022-07-10 ENCOUNTER — Telehealth: Payer: Self-pay | Admitting: Adult Health

## 2022-07-10 NOTE — Telephone Encounter (Signed)
LVM to rtc 

## 2022-07-10 NOTE — Telephone Encounter (Signed)
Next visit is 07/24/22. Tracy [name] saw her therapist [name] yesterday and her therapist thinks she needs a medication adjustment. Her symptoms have gotten worse. Her phone number is [phone]

## 2022-07-10 NOTE — Telephone Encounter (Signed)
Appt 12/20

## 2022-07-11 ENCOUNTER — Other Ambulatory Visit: Payer: Self-pay

## 2022-07-11 DIAGNOSIS — F339 Major depressive disorder, recurrent, unspecified: Secondary | ICD-10-CM

## 2022-07-11 DIAGNOSIS — F411 Generalized anxiety disorder: Secondary | ICD-10-CM

## 2022-07-11 DIAGNOSIS — F410 Panic disorder [episodic paroxysmal anxiety] without agoraphobia: Secondary | ICD-10-CM

## 2022-07-11 DIAGNOSIS — F603 Borderline personality disorder: Secondary | ICD-10-CM

## 2022-07-11 DIAGNOSIS — F4310 Post-traumatic stress disorder, unspecified: Secondary | ICD-10-CM

## 2022-07-11 DIAGNOSIS — F331 Major depressive disorder, recurrent, moderate: Secondary | ICD-10-CM

## 2022-07-11 MED ORDER — RISPERIDONE 1 MG PO TABS: 1.5000 mg | ORAL_TABLET | Freq: Every day | ORAL | 0 refills | Status: DC

## 2022-07-11 NOTE — Telephone Encounter (Signed)
Rtc left pt message with medication increase.

## 2022-07-11 NOTE — Telephone Encounter (Signed)
We can offer her an increase in the Risperdal or Lamictal to help manage the depression. She can also reach out to [facility], her therapist. She can also go to [facility] is she starts having SI.

## 2022-07-11 NOTE — Telephone Encounter (Signed)
Rtc to pt and she reports she is having mood instability per [facility], she felt pt needed an adjustment in her medication. Pt reports she has been on Lamictal for awhile so maybe adjusting that would help. She doesn't know much about the Risperdal but only taking 1 mg at hs. Lamictal is 200 mg taking 2 at bedtime. Pt reports having irritability, anger, depressed, sadness. No SI but passive thoughts. She doesn't feel like she needs to go to [facility] for any reason at this time.   She says she will increase whichever one [place] recommends.   Uses [facility].

## 2022-07-11 NOTE — Telephone Encounter (Signed)
Pt stated she is having increased depression and anxiety.She is not suicidal but stated she is also having passive thoughts about death.She feels exhausted and tired.She stated she does not want to go down that dark hole again.She is taking all meds as prescribed.

## 2022-07-11 NOTE — Telephone Encounter (Signed)
Let's have her increase the Risperdal to 1 and 1/2 tablets at bedtime - call back with an update.

## 2022-07-17 DIAGNOSIS — F3181 Bipolar II disorder: Secondary | ICD-10-CM | POA: Diagnosis not present

## 2022-07-22 ENCOUNTER — Telehealth: Payer: 59

## 2022-07-22 DIAGNOSIS — S4991XA Unspecified injury of right shoulder and upper arm, initial encounter: Secondary | ICD-10-CM | POA: Diagnosis not present

## 2022-07-22 DIAGNOSIS — M25511 Pain in right shoulder: Secondary | ICD-10-CM | POA: Diagnosis not present

## 2022-07-22 DIAGNOSIS — W19XXXA Unspecified fall, initial encounter: Secondary | ICD-10-CM | POA: Diagnosis not present

## 2022-07-24 ENCOUNTER — Telehealth: Payer: 59 | Admitting: Adult Health

## 2022-07-25 ENCOUNTER — Encounter (HOSPITAL_COMMUNITY): Payer: Self-pay

## 2022-07-25 ENCOUNTER — Other Ambulatory Visit (HOSPITAL_COMMUNITY): Payer: Self-pay

## 2022-07-25 ENCOUNTER — Encounter: Payer: Self-pay | Admitting: Family Medicine

## 2022-07-25 ENCOUNTER — Other Ambulatory Visit: Payer: Self-pay

## 2022-07-25 ENCOUNTER — Ambulatory Visit: Payer: 59 | Admitting: Family Medicine

## 2022-07-25 VITALS — BP 119/75 | HR 82 | Ht 71.0 in | Wt 310.0 lb

## 2022-07-25 DIAGNOSIS — M25511 Pain in right shoulder: Secondary | ICD-10-CM

## 2022-07-25 DIAGNOSIS — M12811 Other specific arthropathies, not elsewhere classified, right shoulder: Secondary | ICD-10-CM | POA: Insufficient documentation

## 2022-07-25 DIAGNOSIS — M75101 Unspecified rotator cuff tear or rupture of right shoulder, not specified as traumatic: Secondary | ICD-10-CM | POA: Insufficient documentation

## 2022-07-25 MED ORDER — TRAMADOL HCL 50 MG PO TABS
50.0000 mg | ORAL_TABLET | Freq: Three times a day (TID) | ORAL | 0 refills | Status: DC | PRN
  Filled 2022-07-25: qty 15, 5d supply, fill #0

## 2022-07-25 NOTE — Assessment & Plan Note (Signed)
Recent fall with x-rays urgent care.  I would assume these films show high riding humeral head/shoulder.  She will plan to schedule appoint with Dr. Dianah Field for further evaluation of this.  She may continue NSAIDs and antibiotic tramadol as needed for pain control.

## 2022-07-25 NOTE — Progress Notes (Signed)
Tracy [name] - 56 y.o. female MRN [id number]  Date of birth: May 30, 1966  Subjective Chief Complaint  Patient presents with   Fall    HPI Tracy [name] is a 56 year old female here today with complaint of recent fall.  Reports that she fell off her porch recently.  She landed on her right shoulder and has abrasions on her left knee and arm.  She does have some pain in her left knee.  She was seen at urgent care after this happened.  She had x-rays of her right shoulder did not show any fracture or dislocation.  She was told that there were signs of rotator cuff tear.  She has had pain and decreased range of motion of the right shoulder.  She has used ibuprofen to help with pain which does help slightly.  She denies any numbness or tingling in the arm.  She is performing local wound care on the abrasions on the knee and arm.  ROS:  A comprehensive ROS was completed and negative except as noted per HPI  Allergies  Allergen Reactions   Sulfa Antibiotics Itching, Swelling and Rash   Food     Walnut-mouth swelling/itching   Bupropion Hcl Itching and Rash   Codeine Itching   Sulfamethoxazole-Trimethoprim Rash    Past Medical History:  Diagnosis Date   Acanthosis nigricans, acquired 02/09/2015   Anxiety and depression 10/31/2014   Arthritis    back, knees, right elbow   Arthritis of left hip 06/25/2018   Bilateral swelling of feet and ankles    Bipolar disorder ([place])    Borderline personality disorder ([place])    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   Carpal tunnel syndrome 09/06/2015   Chewing difficulty    Chronic left shoulder pain 05/17/2020   Constipation    Coronary artery disease 03/21/2021   COVID 10/08/2021   Cystic teratoma    BENIGN   Dental crowns present    Diabetes mellitus without complication ([place])    Diabetes mellitus, type 2 ([place]) 06/10/2013   Diarrhea    Diverticulosis of colon without hemorrhage    DOE (dyspnea on exertion) 03/21/2021   Dyspepsia 12/14/2020   Essential hypertension  10/02/2007   Qualifier: Diagnosis of  By: Loanne Drilling MD, Sean A    Facial rash 06/16/2019   Female hirsutism 02/09/2015   Food allergy    Walnuts   GAD (generalized anxiety disorder)    Gastritis and gastroduodenitis    Gastroesophageal reflux disease    Grade II internal hemorrhoids    Greater trochanteric bursitis of left hip 07/24/2018   Hemorrhoids, internal 07/18/2017   History of kidney stones    History of migraine headaches    History of posttraumatic stress disorder (PTSD)    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/2015   Hypothyroidism    HYPOTHYROIDISM, POSTSURGICAL 10/02/2007   Qualifier: Diagnosis of  By: Loanne Drilling MD, Sean A    IBS (irritable bowel syndrome)    Infertility associated with anovulation 02/09/2015   Insulin resistance 02/09/2015   Joint pain    Leg swelling 02/01/5283   Lichen planus    Liver cirrhosis secondary to NASH (nonalcoholic steatohepatitis) ([place])    Lower back pain 07/18/2017   MDD (major depressive disorder) 06/06/2015   Migraine 10/02/2007   Morbid obesity with BMI of 40.0-44.9, adult ([place]) 06/23/2020   NASH (nonalcoholic steatohepatitis)    Nausea without vomiting    Nonalcoholic fatty liver disease 02/09/2015   Obstructive sleep apnea 06/27/2014   Oral mucosal lesion 06/17/2019  Osteoarthritis    Other fatigue    PCOS (polycystic ovarian syndrome)    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   TOBACCO USE, QUIT 02/08/2010   Qualifier: Diagnosis of  By: Loanne Drilling MD, Sean A     Treatment-resistant depression 10/02/2007   Qualifier: Diagnosis of  By: Loanne Drilling MD, Sean A    Trigeminal neuralgia    Unilateral primary osteoarthritis, left hip 02/10/2019   Vitamin D deficiency     Past Surgical History:  Procedure Laterality Date   ACHILLES TENDON SURGERY Left 2007   APPENDECTOMY     CARPAL TUNNEL RELEASE Right 01/12/2016   Procedure: RIGHT CARPAL TUNNEL RELEASE;  Surgeon: Roseanne Kaufman, MD;  Location: [facility];  Service: Orthopedics;  Laterality: Right;   COLONOSCOPY WITH PROPOFOL N/A 04/20/2014   Procedure: COLONOSCOPY WITH PROPOFOL;  Surgeon: Arta Silence, MD;  [facility];  Service: Endoscopy;  Laterality: N/A;   COLONOSCOPY WITH PROPOFOL N/A 01/10/2021   Procedure: COLONOSCOPY WITH PROPOFOL;  Surgeon: Lavena Bullion, DO;  [facility];  Service: Gastroenterology;  Laterality: N/A;   ESOPHAGOGASTRODUODENOSCOPY (EGD) WITH PROPOFOL N/A 04/20/2014   Procedure: ESOPHAGOGASTRODUODENOSCOPY (EGD) WITH PROPOFOL;  Surgeon: Arta Silence, MD;  [facility];  Service: Endoscopy;  Laterality: N/A;   ESOPHAGOGASTRODUODENOSCOPY (EGD) WITH PROPOFOL N/A 01/10/2021   Procedure: ESOPHAGOGASTRODUODENOSCOPY (EGD) WITH PROPOFOL;  Surgeon: Lavena Bullion, DO;  [facility];  Service: Gastroenterology;  Laterality: N/A;   EXCISION HAGLUND'S DEFORMITY WITH ACHILLES TENDON REPAIR Right 02/25/2013   Procedure: RIGHT ACHILLES DEBRIDEMENT AND RECONSTRUCTION;  HAGLUND'S EXCISION; GASTROC RECESSION AND FLEXOR HALLUCIS LONGUS TRANSFER;  Surgeon: Wylene Simmer, MD;  Location: [facility];  Service: Orthopedics;  Laterality: Right;   JOINT REPLACEMENT     hip   LUMBAR LAMINECTOMY     X 3   neck fusion     OVARIAN CYST SURGERY Left 1998   PILONIDAL CYST EXCISION  1994   RIGHT OOPHORECTOMY     STERIOD INJECTION Left 01/12/2016   Procedure: STEROID INJECTION LEFT WRIST;  Surgeon: Roseanne Kaufman, MD;  Location: [facility];  Service: Orthopedics;  Laterality: Left;   TOTAL HIP ARTHROPLASTY Left 03/19/2019   Procedure: LEFT TOTAL HIP ARTHROPLASTY ANTERIOR APPROACH;  Surgeon: Mcarthur Rossetti, MD;  Location: [facility];  Service: Orthopedics;  Laterality: Left;   TOTAL THYROIDECTOMY  2003   UPPER GI ENDOSCOPY  01/19/2015    Social History   Socioeconomic History   Marital status: Divorced    Spouse name: Not on file   Number of children: 0   Years of education: 16   Highest education level: Not on file  Occupational History   Occupation: RN-Telephone Copy    Employer: [employer]    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   Drug use: No   Sexual activity: Not Currently    Partners: Male    Birth control/protection: Condom  Other Topics Concern   Not on file  Social History Narrative   RN - [employer]   Regular exercise: no   Caffeine use: 2 x daily   Right-handed.   Lives alone.   Social Determinants of Health   Financial Resource Strain: Not on file  Food Insecurity: Not on file  Transportation Needs: Not on file  Physical Activity: Not on file  Stress: Not on file  Social Connections: Not on file    Family History  Problem Relation Age of Onset   Heart disease Father    Depression Father    Heart attack Father    Sudden death Father    Anxiety disorder Father    Bipolar disorder Father    Obesity Father    Cancer Brother        THROAT cancer   Depression Brother    Anxiety disorder Brother    Depression Sister    Breast cancer Mother     Hypertension Mother    Cancer Mother    Depression Mother    Sleep apnea Mother    Obesity Mother    Depression Maternal Grandmother    Stroke Maternal Grandfather    Colon cancer Brother    Pancreatic cancer Neg Hx    Stomach cancer Neg Hx    Liver disease Neg Hx     Health Maintenance  Topic Date Due   HIV Screening  Never done   Hepatitis C Screening  Never done   HEMOGLOBIN A1C  07/04/2022   COVID-19 Vaccine (7 - 2023-24 season) 08/10/2022 (Originally 06/18/2022)   Diabetic kidney evaluation - Urine ACR  09/05/2022 (Originally 01/31/2018)   PAP SMEAR-Modifier  09/05/2022 (Originally 10/28/1988)   OPHTHALMOLOGY EXAM  09/19/2022   Diabetic kidney evaluation - eGFR measurement  01/03/2023   FOOT EXAM  07/26/2023   MAMMOGRAM  08/10/2023   DTaP/Tdap/Td (2 - Td or Tdap) 01/03/2028   COLONOSCOPY (Pts 45-64yr Insurance coverage will need to be confirmed)  01/11/2031   INFLUENZA VACCINE  Completed   Zoster Vaccines- Shingrix  Completed   HPV VACCINES  Aged Out     ----------------------------------------------------------------------------------------------------------------------------------------------------------------------------------------------------------------- Physical Exam BP 119/75 (BP Location: Left Arm, Patient Position: Sitting, Cuff Size: Normal)   Pulse 82   Ht _0  (1.803 m)   Wt (!) 310 lb (140.6 kg)   LMP 08/24/2020   SpO2 96%   BMI 43.24 kg/m   Physical Exam Constitutional:      Appearance: Normal appearance.  HENT:     Head: Normocephalic and atraumatic.  Musculoskeletal:     Comments: Left knee without significant swelling.  Range of motion is fairly good.  No tenderness along the patella.  Ligament structures are intact with normal endpoints.  Negative Lachman's and posterior drawer.  Right shoulder without significant tenderness to palpation.  Range of motion is very limited with abduction.  Positive drop arm test.  Skin:    Comments:  Abrasions on left knee, left arm and  foot are scabbed over and appear to be healing well without signs of infection.  Neurological:     Mental Status: She is alert.  Psychiatric:        Mood and Affect: Mood normal.        Behavior: Behavior normal.     ------------------------------------------------------------------------------------------------------------------------------------------------------------------------------------------------------------------- Assessment and Plan  Right shoulder pain Recent fall with x-rays urgent care.  I would assume these films show high riding humeral head/shoulder.  She will plan to schedule appoint with Dr. Dianah Field for further evaluation of this.  She may continue NSAIDs and antibiotic tramadol as needed for pain control.   Meds ordered this encounter  Medications   traMADol (ULTRAM) 50 MG tablet    Sig: Take 1 tablet (50 mg total) by mouth every 8 (eight) hours as needed for up to 5 days.    Dispense:  15 tablet    Refill:  0    No follow-ups on file.    This visit occurred during the SARS-CoV-2 public health emergency.  Safety protocols were in place, including screening questions prior to the visit, additional usage of staff PPE, and extensive cleaning of exam room while observing appropriate contact time as indicated for disinfecting solutions.

## 2022-07-26 ENCOUNTER — Other Ambulatory Visit (HOSPITAL_COMMUNITY): Payer: Self-pay

## 2022-07-26 MED ORDER — TRAMADOL HCL 50 MG PO TABS: 50.0000 mg | ORAL_TABLET | Freq: Three times a day (TID) | ORAL | 0 refills | Status: DC | PRN

## 2022-07-28 ENCOUNTER — Encounter: Payer: Self-pay | Admitting: Family Medicine

## 2022-07-28 ENCOUNTER — Telehealth: Payer: 59 | Admitting: Family Medicine

## 2022-07-28 ENCOUNTER — Ambulatory Visit: Admit: 2022-07-28 | Discharge status: Absent without leave | Payer: 59

## 2022-07-28 DIAGNOSIS — M79605 Pain in left leg: Secondary | ICD-10-CM

## 2022-07-28 DIAGNOSIS — W19XXXA Unspecified fall, initial encounter: Secondary | ICD-10-CM | POA: Diagnosis not present

## 2022-07-28 NOTE — Progress Notes (Signed)
Virtual Visit Consent   Tracy [name], you are scheduled for a virtual visit with a [facility] provider today. Just as with appointments in the office, your consent must be obtained to participate. Your consent will be active for this visit and any virtual visit you may have with one of our providers in the next 365 days. If you have a MyChart account, a copy of this consent can be sent to you electronically.  As this is a virtual visit, video technology does not allow for your provider to perform a traditional examination. This may limit your provider's ability to fully assess your condition. If your provider identifies any concerns that need to be evaluated in person or the need to arrange testing (such as labs, EKG, etc.), we will make arrangements to do so. Although advances in technology are sophisticated, we cannot ensure that it will always work on either your end or our end. If the connection with a video visit is poor, the visit may have to be switched to a telephone visit. With either a video or telephone visit, we are not always able to ensure that we have a secure connection.  By engaging in this virtual visit, you consent to the provision of healthcare and authorize for your insurance to be billed (if applicable) for the services provided during this visit. Depending on your insurance coverage, you may receive a charge related to this service.  I need to obtain your verbal consent now. Are you willing to proceed with your visit today? Tracy [name] has provided verbal consent on 07/28/2022 for a virtual visit (video or telephone). Dellia Nims, FNP  Date: 07/28/2022 12:54 PM  Virtual Visit via Video Note   I, Dellia Nims, connected with  Tracy [name]  ([id number], 1965/11/27) on 07/28/22 at 12:45 PM EST by a video-enabled telemedicine application and verified that I am speaking with the correct person using two identifiers.  Location: Patient: Virtual Visit Location Patient:  Home Provider: Virtual Visit Location Provider: Home Office   I discussed the limitations of evaluation and management by telemedicine and the availability of in person appointments. The patient expressed understanding and agreed to proceed.    History of Present Illness: Tracy [name] is a 56 y.o. who identifies as a female who was assigned female at birth, and is being seen today for left lower leg pain, swelling and shiny following a fall. No calf pain, No history of DVT, no warmth or redness. Marland Kitchen  HPI: HPI  Problems:  Patient Active Problem List   Diagnosis Date Noted   Right shoulder pain 07/25/2022   COVID 10/08/2021   Arthritis 08/09/2021   Bilateral swelling of feet and ankles 08/09/2021   Cancer ([place]) 08/09/2021   Chewing difficulty 08/09/2021   Constipation 08/09/2021   Dental crowns present 08/09/2021   Diabetes mellitus without complication ([place]) 73/41/9379   Food allergy 08/09/2021   History of kidney stones 08/09/2021   History of migraine headaches 08/09/2021   Hypothyroidism 08/09/2021   IBS (irritable bowel syndrome) 08/09/2021   Joint pain 02/40/9735   Lichen planus 32/99/2426   Liver cirrhosis secondary to NASH (nonalcoholic steatohepatitis) ([place]) 08/09/2021   NASH (nonalcoholic steatohepatitis) 08/09/2021   Osteoarthritis 08/09/2021   Other fatigue 08/09/2021   PONV (postoperative nausea and vomiting) 08/09/2021   Shortness of breath on exertion 08/09/2021   DOE (dyspnea on exertion) 03/21/2021   Coronary artery disease 03/21/2021   Diarrhea    Gastroesophageal reflux disease  Gastritis and gastroduodenitis    Diverticulosis of colon without hemorrhage    Grade II internal hemorrhoids    Dyspepsia 12/14/2020   Leg swelling 09/21/2020   Morbid obesity with BMI of 40.0-44.9, adult ([place]) 06/23/2020   Chronic left shoulder pain 05/17/2020   Oral mucosal lesion 06/17/2019   Facial rash 06/16/2019   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    Unilateral primary osteoarthritis, left hip 02/10/2019   Greater trochanteric bursitis of left hip 07/24/2018   Arthritis of left hip 06/25/2018   Primary osteoarthritis of left knee 01/21/2018   Vitamin D deficiency 01/04/2018   Lower back pain 07/18/2017   Hemorrhoids, internal 07/18/2017   Trigeminal neuralgia 01/18/2016   Bipolar disorder ([place]) 11/28/2015   Borderline personality disorder ([place]) 11/28/2015   Carpal tunnel syndrome 09/06/2015   MDD (major depressive disorder) 06/06/2015   Severe episode of recurrent major depressive disorder, without psychotic features ([place])    History of posttraumatic stress disorder (PTSD)    GAD (generalized anxiety disorder)    Insulin resistance 02/09/2015   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/2015   Acanthosis nigricans, acquired 09/38/1829   Nonalcoholic fatty liver disease 02/09/2015   Female hirsutism 02/09/2015   Infertility associated with anovulation 02/09/2015   Anxiety and depression 10/31/2014   Obstructive sleep apnea 06/27/2014   Diabetes mellitus, type 2 ([place]) 06/10/2013   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[name]) 01/01/2012   Cystic teratoma    PCOS (polycystic ovarian syndrome)    TOBACCO USE, QUIT 02/08/2010   HYPOTHYROIDISM, POSTSURGICAL 10/02/2007   POLYCYSTIC OVARIAN DISEASE 10/02/2007   Treatment-resistant depression 10/02/2007   Migraine 10/02/2007   Essential hypertension 10/02/2007   RENAL CALCULUS, HX OF 10/02/2007    Allergies:  Allergies  Allergen Reactions   Sulfa Antibiotics Itching, Swelling and Rash   Food     Walnut-mouth swelling/itching   Bupropion Hcl Itching and Rash   Codeine Itching   Sulfamethoxazole-Trimethoprim Rash   Medications:  Current Outpatient Medications:    albuterol (VENTOLIN HFA) 108 (90 Base) MCG/ACT inhaler, Inhale 2 puffs into the lungs every 6 (six) hours as needed for wheezing or shortness of breath., Disp: 8 g, Rfl: 3   Armodafinil 200 MG TABS, Take 1 Tablet by mouth daily as needed (sleepiness).,  Disp: 30 tablet, Rfl: 2   Blood Glucose Monitoring Suppl (FREESTYLE LITE) w/Device KIT, Use as directed., Disp: 1 kit, Rfl: 0   Blood Glucose Monitoring Suppl (FREESTYLE LITE) w/Device KIT, Check fasting blood sugar every morning and before meals up to 4 times daily., Disp: 1 kit, Rfl: PRN   butalbital-acetaminophen-caffeine (FIORICET) 50-325-40 MG tablet, Take 1-2 tablets by mouth every 6 (six) hours as needed for headache., Disp: 20 tablet, Rfl: 0   clobetasol (TEMOVATE) 0.05 % GEL, Apply small amount to affected area twice daily using finger. Gently pat dry with cotton gauze.  Do not eat,drink, or speak for 30 minutes to allow absorption., Disp: 30 g, Rfl: 2   clonazePAM (KLONOPIN) 1 MG tablet, Take 1 tablet (1 mg total) by mouth 3 (three) times daily., Disp: 90 tablet, Rfl: 2   clotrimazole (MYCELEX) 10 MG troche, Dissolve 1 tablet by mouth 2 times a day. Dissolve tablet slowly and completely on days you use topical steroid., Disp: 70 Troche, Rfl: 2   Continuous Blood Gluc Sensor (DEXCOM G6 SENSOR) MISC, Replace every 10 days.  Use to check blood sugar., Disp: 9 each, Rfl: 1   Continuous Blood Gluc Transmit (DEXCOM G6 TRANSMITTER) MISC, Use to check continuous blood sugar.  Replace every 90 days, Disp: 1 each, Rfl: 3   cyclobenzaprine (FLEXERIL) 10 MG tablet, Take 1 tablet (10 mg total) by mouth 3 (three) times daily as needed for muscle spasms., Disp: 30 tablet, Rfl: 0   ferrous sulfate 325 (65 FE) MG EC tablet, Take 1 tablet (325 mg total) by mouth in the morning and at bedtime. Take iron supplement 2 hours before or 4 hours after PPI or other antacids as this requires some level of gastric acidity to aid in absorption, Disp: 60 tablet, Rfl: 5   folic acid (FOLVITE) 1 MG tablet, Take 1 tablet by mouth daily., Disp: 90 tablet, Rfl: 0   furosemide (LASIX) 40 MG tablet, TAKE 1 TABLET BY MOUTH DAILY., Disp: 30 tablet, Rfl: 3   glucose blood (FREESTYLE LITE) test strip, Use as instructed, Disp: 100  each, Rfl: 12   glucose blood (FREESTYLE LITE) test strip, Check fasting blood sugar every morning and before meals up to 4 times a day as directed., Disp: 100 strip, Rfl: 12   hydrOXYzine (ATARAX) 25 MG tablet, Take 1 tablet (25 mg total) by mouth 3 (three) times daily as needed for anxiety., Disp: 90 tablet, Rfl: 3   insulin degludec (TRESIBA FLEXTOUCH) 100 UNIT/ML FlexTouch Pen, Start 43 units twice a day, increase by 3 units every time blood sugar prior to injection is above 120, Disp: , Rfl:    insulin lispro (HUMALOG KWIKPEN) 100 UNIT/ML KwikPen, Inject up to 16 units 3 times daily at mealtime per sliding scale., Disp: 15 mL, Rfl: 11   Insulin Pen Needle (UNIFINE PENTIPS) 31G X 5 MM MISC, Use as directed to inject insulin, Disp: 100 each, Rfl: 3   lamoTRIgine (LAMICTAL) 200 MG tablet, Take 2 tablets by mouth at bedtime., Disp: 180 tablet, Rfl: 0   Lancets 30G MISC, Check fasting blood sugar every morning and before meals up to 4 times daily., Disp: 100 each, Rfl: 12   lidocaine (XYLOCAINE) 5 % ointment, Apply topically to affected areas 3 times daily as needed., Disp: 35.44 g, Rfl: 0   lisinopril (ZESTRIL) 10 MG tablet, Take 1 tablet (10 mg total) by mouth daily., Disp: 90 tablet, Rfl: 3   meloxicam (MOBIC) 15 MG tablet, Take 1 tablet by mouth once every morning with a meal for 2 weeks, then daily as needed for pain., Disp: 30 tablet, Rfl: 0   naproxen (NAPROSYN) 500 MG tablet, Take 1 tablet (500 mg total) by mouth 2 (two) times daily with a meal., Disp: 30 tablet, Rfl: 0   neomycin-bacitracin-polymyxin [phone] OINT, Apply topically 2 (two) times daily for 10 days To corners of mouth, Disp: 14 g, Rfl: 2   nystatin (MYCOSTATIN) 100000 UNIT/ML suspension, Use 13m to swish and swallow 4  times daily for 10 days, Disp: 200 mL, Rfl: 0   nystatin (NYAMYC) powder, Apply 3 times daily as directed., Disp: 60 g, Rfl: 3   nystatin ointment (MYCOSTATIN), Apply to the skin 2 times daily to corners of  mouth, Disp: 15 g, Rfl: 2   ondansetron (ZOFRAN-ODT) 4 MG disintegrating tablet, Dissolve 1 tablet (4 mg total) by mouth 2 (two) times daily as needed for nausea or vomiting., Disp: 20 tablet, Rfl: 1   pantoprazole (PROTONIX) 40 MG tablet, Take 2 tablets (80 mg total) by mouth 2 (two) times daily., Disp: 60 tablet, Rfl: 3   pimecrolimus (ELIDEL) 1 % cream, Apply a small amount to affected area 2 x daily. Gently pat area dry w/cotton gauze. No eating,  drinking or speaking for 20 - 30 minutes to allow medicine to be absorbed. Can apply to 2x2 gauze & placed against the affected area., Disp: 60 g, Rfl: 3   risperiDONE (RISPERDAL) 1 MG tablet, Take 1.5 tablets (1.5 mg total) by mouth at bedtime., Disp: 135 tablet, Rfl: 0   rosuvastatin (CRESTOR) 20 MG tablet, TAKE 1 TABLET BY MOUTH DAILY., Disp: 90 tablet, Rfl: 3   sertraline (ZOLOFT) 100 MG tablet, Take 2 tablets (200 mg total) by mouth every morning., Disp: 180 tablet, Rfl: 3   spironolactone (ALDACTONE) 50 MG tablet, Take 1 tablet (50 mg total) by mouth 2 (two) times daily., Disp: 180 tablet, Rfl: 2   SYNTHROID 300 MCG tablet, TAKE 1 TABLET BY MOUTH ONCE A DAY BEFORE BREAKFAST, Disp: 90 tablet, Rfl: 1   tirzepatide (MOUNJARO) 15 MG/0.5ML Pen, Inject 15 mg into the skin once a week., Disp: 6 mL, Rfl: 1   traMADol (ULTRAM) 50 MG tablet, Take 1 tablet (50 mg total) by mouth every 8 (eight) hours as needed for up to 5 days., Disp: 15 tablet, Rfl: 0   Vitamin D, Ergocalciferol, (DRISDOL) 1.25 MG (50000 UNIT) CAPS capsule, TAKE 1 CAPSULE BY MOUTH EVERY 7 DAYS., Disp: 12 capsule, Rfl: 1  Observations/Objective: Patient is well-developed, well-nourished in no acute distress.  Resting comfortably  at home.  Head is normocephalic, atraumatic.  No labored breathing.  Speech is clear and coherent with logical content.  Patient is alert and oriented at baseline.    Assessment and Plan: 1. Left leg pain  Proceed to UC for further evaluation.   Follow Up  Instructions: I discussed the assessment and treatment plan with the patient. The patient was provided an opportunity to ask questions and all were answered. The patient agreed with the plan and demonstrated an understanding of the instructions.  A copy of instructions were sent to the patient via MyChart unless otherwise noted below.     The patient was advised to call back or seek an in-person evaluation if the symptoms worsen or if the condition fails to improve as anticipated.  Time:  I spent 10 minutes with the patient via telehealth technology discussing the above problems/concerns.    Dellia Nims, FNP

## 2022-07-28 NOTE — Patient Instructions (Signed)
Fall Prevention in the Home, Adult Falls can cause injuries and affect people of all ages. There are many simple things that you can do to make your home safe and to help prevent falls. Ask for help when making these changes, if needed. What actions can I take to prevent falls? General instructions Use good lighting in all rooms. Replace any light bulbs that burn out, turn on lights if it is dark, and use night-lights. Place frequently used items in easy-to-reach places. Lower the shelves around your home if necessary. Set up furniture so that there are clear paths around it. Avoid moving your furniture around. Remove throw rugs and other tripping hazards from the floor. Avoid walking on wet floors. Fix any uneven floor surfaces. Add color or contrast paint or tape to grab bars and handrails in your home. Place contrasting color strips on the first and last steps of staircases. When you use a stepladder, make sure that it is completely opened and that the sides and supports are firmly locked. Have someone hold the ladder while you are using it. Do not climb a closed stepladder. Know where your pets are when moving through your home. What can I do in the bathroom?     Keep the floor dry. Immediately clean up any water that is on the floor. Remove soap buildup in the tub or shower regularly. Use nonskid mats or decals on the floor of the tub or shower. Attach bath mats securely with double-sided, nonslip rug tape. If you need to sit down while you are in the shower, use a plastic, nonslip stool. Install grab bars by the toilet and in the tub and shower. Do not use towel bars as grab bars. What can I do in the bedroom? Make sure that a bedside light is easy to reach. Do not use oversized bedding that reaches the floor. Have a firm chair that has side arms to use for getting dressed. What can I do in the kitchen? Clean up any spills right away. If you need to reach for something above you,  use a sturdy step stool that has a grab bar. Keep electrical cables out of the way. Do not use floor polish or wax that makes floors slippery. If you must use wax, make sure that it is non-skid floor wax. What can I do with my stairs? Do not leave any items on the stairs. Make sure that you have a light switch at the top and the bottom of the stairs. Have them installed if you do not have them. Make sure that there are handrails on both sides of the stairs. Fix handrails that are broken or loose. Make sure that handrails are as long as the staircases. Install non-slip stair treads on all stairs in your home. Avoid having throw rugs at the top or bottom of stairs, or secure the rugs with carpet tape to prevent them from moving. Choose a carpet design that does not hide the edge of steps on the stairs. Check any carpeting to make sure that it is firmly attached to the stairs. Fix any carpet that is loose or worn. What can I do on the outside of my home? Use bright outdoor lighting. Regularly repair the edges of walkways and driveways and fix any cracks. Remove high doorway thresholds. Trim any shrubbery on the main path into your home. Regularly check that handrails are securely fastened and in good repair. Both sides of all steps should have handrails. Install guardrails along  the edges of any raised decks or porches. Clear walkways of debris and clutter, including tools and rocks. Have leaves, snow, and ice cleared regularly. Use sand or salt on walkways during winter months. In the garage, clean up any spills right away, including grease or oil spills. What other actions can I take? Wear closed-toe shoes that fit well and support your feet. Wear shoes that have rubber soles or low heels. Use mobility aids as needed, such as canes, walkers, scooters, and crutches. Review your medicines with your health care provider. Some medicines can cause dizziness or changes in blood pressure, which  increase your risk of falling. Talk with your health care provider about other ways that you can decrease your risk of falls. This may include working with a physical therapist or trainer to improve your strength, balance, and endurance. Where to find more information Centers for Disease Control and Prevention, [employer]: http://www.wolf.info/ National Institute on Aging: http://kim-miller.com/ Contact a health care provider if: You are afraid of falling at home. You feel weak, drowsy, or dizzy at home. You fall at home. Summary There are many simple things that you can do to make your home safe and to help prevent falls. Ways to make your home safe include removing tripping hazards and installing grab bars in the bathroom. Ask for help when making these changes in your home. This information is not intended to replace advice given to you by your health care provider. Make sure you discuss any questions you have with your health care provider. Document Revised: 04/23/2021 Document Reviewed: 02/23/2020 Elsevier Patient Education  [place].

## 2022-07-29 ENCOUNTER — Encounter: Payer: Self-pay | Admitting: Family Medicine

## 2022-07-29 ENCOUNTER — Other Ambulatory Visit: Payer: Self-pay | Admitting: Family Medicine

## 2022-07-30 ENCOUNTER — Other Ambulatory Visit (HOSPITAL_BASED_OUTPATIENT_CLINIC_OR_DEPARTMENT_OTHER): Payer: Self-pay

## 2022-07-30 ENCOUNTER — Encounter: Payer: Self-pay | Admitting: Medical-Surgical

## 2022-07-30 ENCOUNTER — Other Ambulatory Visit: Payer: Self-pay

## 2022-07-30 ENCOUNTER — Encounter: Payer: Self-pay | Admitting: Family Medicine

## 2022-07-30 ENCOUNTER — Ambulatory Visit: Payer: 59 | Admitting: Medical-Surgical

## 2022-07-30 ENCOUNTER — Other Ambulatory Visit (HOSPITAL_COMMUNITY): Payer: Self-pay

## 2022-07-30 ENCOUNTER — Ambulatory Visit (INDEPENDENT_AMBULATORY_CARE_PROVIDER_SITE_OTHER): Payer: 59

## 2022-07-30 VITALS — BP 116/73 | HR 77 | Resp 20 | Ht 71.0 in | Wt 313.1 lb

## 2022-07-30 DIAGNOSIS — E1169 Type 2 diabetes mellitus with other specified complication: Secondary | ICD-10-CM | POA: Diagnosis not present

## 2022-07-30 DIAGNOSIS — E162 Hypoglycemia, unspecified: Secondary | ICD-10-CM | POA: Diagnosis not present

## 2022-07-30 DIAGNOSIS — M19072 Primary osteoarthritis, left ankle and foot: Secondary | ICD-10-CM | POA: Diagnosis not present

## 2022-07-30 DIAGNOSIS — M79605 Pain in left leg: Secondary | ICD-10-CM

## 2022-07-30 DIAGNOSIS — M79662 Pain in left lower leg: Secondary | ICD-10-CM | POA: Diagnosis not present

## 2022-07-30 LAB — POCT GLYCOSYLATED HEMOGLOBIN (HGB A1C): HbA1c, POC (controlled diabetic range): 6.5 % (ref 0.0–7.0)

## 2022-07-30 LAB — HEMOGLOBIN A1C: Hemoglobin A1C: 6.5

## 2022-07-30 MED ORDER — KETOROLAC TROMETHAMINE 10 MG PO TABS: 10.0000 mg | ORAL_TABLET | Freq: Three times a day (TID) | ORAL | 0 refills | Status: AC | PRN

## 2022-07-30 MED ORDER — MOUNJARO 12.5 MG/0.5ML ~~LOC~~ SOAJ
12.5000 mg | SUBCUTANEOUS | 0 refills | Status: DC
  Filled 2022-07-30 (×2): qty 2, 28d supply, fill #0

## 2022-07-30 MED ORDER — CYCLOBENZAPRINE HCL 10 MG PO TABS
10.0000 mg | ORAL_TABLET | Freq: Three times a day (TID) | ORAL | 0 refills | Status: DC | PRN
  Filled 2022-07-30: qty 30, 10d supply, fill #0

## 2022-07-30 MED ORDER — FUROSEMIDE 40 MG PO TABS
40.0000 mg | ORAL_TABLET | Freq: Every day | ORAL | 3 refills | Status: DC
  Filled 2022-07-30: qty 90, 90d supply, fill #0
  Filled 2023-01-12: qty 90, 90d supply, fill #1

## 2022-07-30 MED ORDER — NAPROXEN 500 MG PO TABS
500.0000 mg | ORAL_TABLET | Freq: Two times a day (BID) | ORAL | 0 refills | Status: DC
  Filled 2022-07-30: qty 30, 15d supply, fill #0

## 2022-07-30 NOTE — Telephone Encounter (Signed)
See other MyChart message

## 2022-07-30 NOTE — Progress Notes (Signed)
Established Patient Office Visit  Subjective   Patient ID: Tracy [name], female   DOB: [birth year] Age: 56 y.o. MRN: [id number]   Chief Complaint  Patient presents with   Leg Pain   Blood Sugar Problem    HPI Pleasant 56 year old female presenting today for the following:  Hypoglycemia: Currently taking Mounjaro 15 mg weekly.  Notes that she uses a Dexcom CGM and yesterday/overnight, she had multiple episodes of hypoglycemia.  She tried getting her sugar up with intermittent and a cheese stick several times but this did not seem to help maintain her blood sugars at a normal level.  She has had a few times that she felt dizzy as if she was going to pass out.  Has a glucometer at home that she uses to double check the readings and reports that the hypoglycemic episodes were accurate.  Last hemoglobin A1c checked Jan 03, 2023 was 12.1%.  Notes that she usually eats yogurt or DeLites for breakfast but skips lunch because she has no appetite during the day.  In the evening, she tends to graze and eat bigger portions.  Notes that her dinner options often include tuna salad, salmon patties, soups, sandwiches, etc.  Recent fall: She had a recent fall that has left her and quite a bit of pain in multiple joints.  She notes that she was told she probably has a rotator cuff tear in the right shoulder but also has pain in her left shoulder, left calf, and left ankle.  Of these, the most bothersome is the left calf and left ankle.  She has swelling in those areas and notes that her calf feels extremely tight.  She was evaluated after she fell but no x-rays were done of her lower extremities.  She has Norco, tramadol, naproxen, and Flexeril at home but reports that the pain is not controlled at all with these measures.   Objective:    Vitals:   07/30/22 1111  BP: 116/73  Pulse: 77  Resp: 20  Height: 5' 11"  (1.803 m)  Weight: (!) 313 lb 1.3 oz (142 kg)  SpO2: 97%  BMI (Calculated): 43.69     Physical Exam Vitals reviewed.  Constitutional:      General: She is not in acute distress.    Appearance: Normal appearance. She is not ill-appearing.  HENT:     Head: Normocephalic and atraumatic.  Cardiovascular:     Rate and Rhythm: Normal rate and regular rhythm.  Pulmonary:     Effort: Pulmonary effort is normal. No respiratory distress.  Skin:    General: Skin is warm and dry.  Neurological:     Mental Status: She is alert and oriented to person, place, and time.  Psychiatric:        Mood and Affect: Mood normal.        Behavior: Behavior normal.        Thought Content: Thought content normal.        Judgment: Judgment normal.    Results for orders placed or performed in visit on 07/30/22 (from the past 24 hour(s))  POCT HgB A1C     Status: None   Collection Time: 07/30/22 11:28 AM  Result Value Ref Range   Hemoglobin A1C     HbA1c POC (<> result, manual entry)     HbA1c, POC (prediabetic range)     HbA1c, POC (controlled diabetic range) 6.5 0.0 - 7.0 %  Hemoglobin A1c     Status: None  Collection Time: 07/30/22 11:29 AM  Result Value Ref Range   Hemoglobin A1C 6.5        The 10-year ASCVD risk score (Arnett DK, et al., 2019) is: 4.4%   Values used to calculate the score:     Age: 42 years     Sex: Female     Is Non-Hispanic African American: No     Diabetic: Yes     Tobacco smoker: No     Systolic Blood Pressure: 122 mmHg     Is BP treated: Yes     HDL Cholesterol: 37 mg/dL     Total Cholesterol: 160 mg/dL   Assessment & Plan:   1. Hypoglycemia 2. Type 2 diabetes mellitus with other specified complication, without long-term current use of insulin (HCC) POCT hemoglobin A1c rechecked today with a result of 6.5% indicating excellent control of diabetes.  Unfortunately, she is having verified hypoglycemic episodes so recommend cutting back Mounjaro to 12.5 mg weekly.  Discussed appropriate snacks and measures to take when sugars began to drop.  Would  like her to add 15 g of carbohydrates but make sure to get some protein and to help stabilize her sugars and prevent recurrence of hypoglycemia. - POCT HgB A1C  3. Left leg pain Significant swelling and difficulty with weightbearing.  Getting x-rays today.  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  Follow-up with Dr. Darene Lamer as scheduled tomorrow morning.  She is having no relief of symptoms with her current measures and reports significant pain.  Discontinue naproxen and meloxicam.  Adding a short course of Toradol 10 mg every 8 hours as needed for up to 5 days.  Patient aware of risk to kidney function and to avoid all other NSAIDs while taking this medication.  Last CMP showed normal kidney function. - DG Ankle Complete Left; Future - DG Tibia/Fibula Left; Future  Return if symptoms worsen or fail to improve.  ___________________________________________ Clearnce Sorrel, DNP, APRN, FNP-BC Primary [facility] and [place]

## 2022-07-31 ENCOUNTER — Other Ambulatory Visit (HOSPITAL_COMMUNITY): Payer: Self-pay

## 2022-07-31 ENCOUNTER — Ambulatory Visit (INDEPENDENT_AMBULATORY_CARE_PROVIDER_SITE_OTHER): Payer: 59 | Admitting: Sports Medicine

## 2022-07-31 ENCOUNTER — Ambulatory Visit (INDEPENDENT_AMBULATORY_CARE_PROVIDER_SITE_OTHER): Payer: 59

## 2022-07-31 ENCOUNTER — Other Ambulatory Visit: Payer: Self-pay

## 2022-07-31 DIAGNOSIS — H6122 Impacted cerumen, left ear: Secondary | ICD-10-CM | POA: Insufficient documentation

## 2022-07-31 DIAGNOSIS — M19072 Primary osteoarthritis, left ankle and foot: Secondary | ICD-10-CM | POA: Insufficient documentation

## 2022-07-31 DIAGNOSIS — W130XXA Fall from, out of or through balcony, initial encounter: Secondary | ICD-10-CM | POA: Diagnosis not present

## 2022-07-31 DIAGNOSIS — R200 Anesthesia of skin: Secondary | ICD-10-CM | POA: Diagnosis not present

## 2022-07-31 DIAGNOSIS — G5603 Carpal tunnel syndrome, bilateral upper limbs: Secondary | ICD-10-CM | POA: Diagnosis not present

## 2022-07-31 DIAGNOSIS — M75101 Unspecified rotator cuff tear or rupture of right shoulder, not specified as traumatic: Secondary | ICD-10-CM

## 2022-07-31 DIAGNOSIS — M12811 Other specific arthropathies, not elsewhere classified, right shoulder: Secondary | ICD-10-CM | POA: Diagnosis not present

## 2022-07-31 DIAGNOSIS — R202 Paresthesia of skin: Secondary | ICD-10-CM | POA: Diagnosis not present

## 2022-07-31 MED ORDER — TRIAZOLAM 0.25 MG PO TABS: ORAL_TABLET | ORAL | 0 refills | Status: DC

## 2022-07-31 NOTE — Assessment & Plan Note (Signed)
Also had some pain left ankle with swelling, also has an abrasion noninfected left knee. X-rays did show osteoarthritis but no acute fractures. We will treat this conservatively as below, I am happy to inject her tibiotalar joint if insufficient improvement by the 6-week follow-up.

## 2022-07-31 NOTE — Assessment & Plan Note (Signed)
Increasing numbness and tingling left hand she notes that this happens in all fingers, she does have a slightly positive Tinel's sign on the left, she also has history of cervical fusion. This is worsened after the fall, I am not sure if this is radicular versus carpal tunnel, she would have a carpal tunnel brace, medications as below. I would like x-rays of her cervical spine, carpal tunnel bracing, home conditioning for carpal tunnel syndrome and cervical spondylosis, we will watch this for 6 weeks before considering nerve conduction study if symptoms are still present and have not declared themselves as either radicular or carpal tunnel.

## 2022-07-31 NOTE — Progress Notes (Signed)
    Procedures performed today:    Indication: Cerumen impaction of the left ear(s) Medical necessity statement: On physical examination, cerumen impairs clinically significant portions of the external auditory canal, and tympanic membrane. Noted obstructive, copious cerumen that cannot be removed without magnification and instrumentations requiring physician skills Consent: Discussed benefits and risks of procedure and verbal consent obtained Procedure: Patient was prepped for the procedure. Utilized an otoscope to assess and take note of the ear canal, the tympanic membrane, and the presence, amount, and placement of the cerumen. Gentle water irrigation and soft plastic curette was utilized to remove cerumen.  Post procedure examination: shows cerumen was completely removed. Patient tolerated procedure well. The patient is made aware that they may experience temporary vertigo, temporary hearing loss, and temporary discomfort. If these symptom last for more than 24 hours to call the clinic or proceed to the ED.  Independent interpretation of notes and tests performed by another provider:   None.  Brief History, Exam, Impression, and Recommendations:    Rotator cuff tear arthropathy of right shoulder Had a fall off the porch about a week ago, increasing pain right shoulder with significant loss of motion and weakness particularly to abduction, positive painful arc, positive drop arm sign, suspect rotator cuff tearing. She had x-rays at an outside facility that were suspected to show a high riding humeral head which does raise suspicion of cuff tearing. I would like an MRI, triazolam for preprocedural anxiolysis. She has some Toradol at home that she uses for pain, she feels that this works significantly better than tramadol.  Carpal tunnel syndrome Increasing numbness and tingling left hand she notes that this happens in all fingers, she does have a slightly positive Tinel's sign on the left,  she also has history of cervical fusion. This is worsened after the fall, I am not sure if this is radicular versus carpal tunnel, she would have a carpal tunnel brace, medications as below. I would like x-rays of her cervical spine, carpal tunnel bracing, home conditioning for carpal tunnel syndrome and cervical spondylosis, we will watch this for 6 weeks before considering nerve conduction study if symptoms are still present and have not declared themselves as either radicular or carpal tunnel.  Localized, primary osteoarthritis of the ankle and foot, left Also had some pain left ankle with swelling, also has an abrasion noninfected left knee. X-rays did show osteoarthritis but no acute fractures. We will treat this conservatively as below, I am happy to inject her tibiotalar joint if insufficient improvement by the 6-week follow-up.  Hearing loss due to cerumen impaction, left Irrigation as above.    ____________________________________________ [name], M.D., ABFM., CAQSM., [name]. Primary Care and Sports Medicine [facility]  Adjunct Professor of [employer]  Risk manager

## 2022-07-31 NOTE — Assessment & Plan Note (Signed)
Had a fall off the porch about a week ago, increasing pain right shoulder with significant loss of motion and weakness particularly to abduction, positive painful arc, positive drop arm sign, suspect rotator cuff tearing. She had x-rays at an outside facility that were suspected to show a high riding humeral head which does raise suspicion of cuff tearing. I would like an MRI, triazolam for preprocedural anxiolysis. She has some Toradol at home that she uses for pain, she feels that this works significantly better than tramadol.

## 2022-07-31 NOTE — Assessment & Plan Note (Signed)
Irrigation as above.

## 2022-08-03 ENCOUNTER — Ambulatory Visit (INDEPENDENT_AMBULATORY_CARE_PROVIDER_SITE_OTHER): Payer: 59

## 2022-08-03 DIAGNOSIS — M12811 Other specific arthropathies, not elsewhere classified, right shoulder: Secondary | ICD-10-CM | POA: Diagnosis not present

## 2022-08-03 DIAGNOSIS — M75101 Unspecified rotator cuff tear or rupture of right shoulder, not specified as traumatic: Secondary | ICD-10-CM | POA: Diagnosis not present

## 2022-08-03 DIAGNOSIS — M19011 Primary osteoarthritis, right shoulder: Secondary | ICD-10-CM | POA: Diagnosis not present

## 2022-08-03 DIAGNOSIS — S46011A Strain of muscle(s) and tendon(s) of the rotator cuff of right shoulder, initial encounter: Secondary | ICD-10-CM | POA: Diagnosis not present

## 2022-08-06 ENCOUNTER — Ambulatory Visit: Payer: Commercial Managed Care - PPO | Admitting: Family Medicine

## 2022-08-07 ENCOUNTER — Other Ambulatory Visit: Payer: Self-pay

## 2022-08-07 ENCOUNTER — Other Ambulatory Visit: Payer: Self-pay | Admitting: Family Medicine

## 2022-08-07 ENCOUNTER — Other Ambulatory Visit (HOSPITAL_COMMUNITY): Payer: Self-pay

## 2022-08-07 ENCOUNTER — Encounter: Payer: Self-pay | Admitting: Sports Medicine

## 2022-08-07 DIAGNOSIS — M12811 Other specific arthropathies, not elsewhere classified, right shoulder: Secondary | ICD-10-CM

## 2022-08-07 MED ORDER — PANTOPRAZOLE SODIUM 40 MG PO TBEC
80.0000 mg | DELAYED_RELEASE_TABLET | Freq: Two times a day (BID) | ORAL | 3 refills | Status: DC
  Filled 2022-08-07: qty 120, 30d supply, fill #0
  Filled 2022-11-06: qty 120, 30d supply, fill #1

## 2022-08-08 ENCOUNTER — Other Ambulatory Visit (HOSPITAL_COMMUNITY): Payer: Self-pay

## 2022-08-08 ENCOUNTER — Encounter (HOSPITAL_COMMUNITY): Payer: Self-pay

## 2022-08-09 ENCOUNTER — Other Ambulatory Visit: Payer: Self-pay

## 2022-08-09 ENCOUNTER — Other Ambulatory Visit (HOSPITAL_COMMUNITY): Payer: Self-pay

## 2022-08-09 ENCOUNTER — Encounter: Payer: Self-pay | Admitting: Sports Medicine

## 2022-08-12 ENCOUNTER — Telehealth: Payer: 59 | Admitting: Adult Health

## 2022-08-12 ENCOUNTER — Telehealth (INDEPENDENT_AMBULATORY_CARE_PROVIDER_SITE_OTHER): Payer: Commercial Managed Care - PPO | Admitting: Adult Health

## 2022-08-12 ENCOUNTER — Encounter: Payer: Self-pay | Admitting: Adult Health

## 2022-08-12 ENCOUNTER — Other Ambulatory Visit: Payer: Self-pay

## 2022-08-12 ENCOUNTER — Encounter: Payer: Self-pay | Admitting: Sports Medicine

## 2022-08-12 DIAGNOSIS — F411 Generalized anxiety disorder: Secondary | ICD-10-CM

## 2022-08-12 DIAGNOSIS — F332 Major depressive disorder, recurrent severe without psychotic features: Secondary | ICD-10-CM

## 2022-08-12 DIAGNOSIS — F4310 Post-traumatic stress disorder, unspecified: Secondary | ICD-10-CM

## 2022-08-12 DIAGNOSIS — F410 Panic disorder [episodic paroxysmal anxiety] without agoraphobia: Secondary | ICD-10-CM | POA: Diagnosis not present

## 2022-08-12 DIAGNOSIS — F603 Borderline personality disorder: Secondary | ICD-10-CM | POA: Diagnosis not present

## 2022-08-12 DIAGNOSIS — F331 Major depressive disorder, recurrent, moderate: Secondary | ICD-10-CM

## 2022-08-12 MED ORDER — LAMOTRIGINE 200 MG PO TABS
400.0000 mg | ORAL_TABLET | Freq: Every day | ORAL | 1 refills | Status: DC
  Filled 2022-08-12 – 2023-05-22 (×2): qty 180, 90d supply, fill #0

## 2022-08-12 NOTE — Progress Notes (Addendum)
Tracy [name] [id number] November 27, 1965 57 y.o.  Virtual Visit via Video Note  I connected with pt @ on 08/26/22 at  5:00 PM EST by a video enabled telemedicine application and verified that I am speaking with the correct person using two identifiers.   I discussed the limitations of evaluation and management by telemedicine and the availability of in person appointments. The patient expressed understanding and agreed to proceed.  I discussed the assessment and treatment plan with the patient. The patient was provided an opportunity to ask questions and all were answered. The patient agreed with the plan and demonstrated an understanding of the instructions.   The patient was advised to call back or seek an in-person evaluation if the symptoms worsen or if the condition fails to improve as anticipated.  I provided 25 minutes of non-face-to-face time during this encounter.  The patient was located at home.  The provider was located at [place].   Aloha Gell, NP   Subjective:   Patient ID:  Tracy [name] is a 57 y.o. (DOB 1966/05/14) female.  Chief Complaint: No chief complaint on file.   HPI Tracy [name] presents for follow-up of MDD, GAD, PTSD, and Panic attacks.  Describes mood today as "ok". Pleasant. Decreased tearfulness. Mood symptoms - reports decreased depression, anxiety and irritability. Reports some worry and rumination - "a little bit" Concerned about being out of work - financial. Reports 2 recent panic attacks. Reports mood has improved - "better today". Stating "I'm doing a little bit better". Enjoying her new home - hosted Christmas this year. Fell and tore rotator cuff - plans to have surgery. Has a support system to help during her recovery.  Seeing therapist regularly. Decreased interest and motivation. Taking medications as prescribed. Energy levels lower. Active, does not have a regular exercise routine. Walking dogs. Enjoys some usual  interests and activities. Lives alone with 3 dogs (shares with ex). Recently divorced. Siblings local. Spending time with family. Appetite adequate. Weight stable - 300 pounds.  Sleeps well most nights. Averages 4 to 5 hours. Sleep apnea - taking Nuvigil in the morning.  Denies SI or HI. Denies AH or VH.  Working with [employer] - bi-weekly.  Previous medication trials:  Antipsychotics: Vraylar-TD, Latuda, Abilify, Geodon, Rexulti, Lybalvi, Risperdal  Mood Stabilizers - Depakote, Lithium, Lamictal, Equetro, Topamax, Trileptal, Gabapentin  SSRI - Zoloft, Lexapro, Celexa, Prozac, Viibryd, Trintellix, Prozac  SNRI - Pristiq, Effexor, Cymbalta  Wellbutrin - allergic reaction  Anti-anxiety - Buspar, Xanax, Clonazepam, Ativan, Valium  Sleep agents - Trazadone, Ambien, Restoril   Other - Deplin, Emsam, Serzone, Doxepin, Nuvigil, Vistaril  Previous treatment: ECT and TMS     Review of Systems:  Review of Systems  Musculoskeletal:  Negative for gait problem.  Neurological:  Negative for tremors.  Psychiatric/Behavioral:         Please refer to HPI    Medications: I have reviewed the patient's current medications.  Current Outpatient Medications  Medication Sig Dispense Refill   albuterol (VENTOLIN HFA) 108 (90 Base) MCG/ACT inhaler Inhale 2 puffs into the lungs every 6 (six) hours as needed for wheezing or shortness of breath. 8 g 3   amoxicillin-clavulanate (AUGMENTIN) 875-125 MG tablet Take 1 tablet by mouth 2 (two) times daily. 20 tablet 0   Armodafinil 200 MG TABS Take 1 Tablet by mouth daily as needed (sleepiness). 30 tablet 2   Blood Glucose Monitoring Suppl (FREESTYLE LITE) w/Device KIT Use as directed. 1 kit 0  Blood Glucose Monitoring Suppl (FREESTYLE LITE) w/Device KIT Check fasting blood sugar every morning and before meals up to 4 times daily. 1 kit PRN   butalbital-acetaminophen-caffeine (FIORICET) 50-325-40 MG tablet Take 1-2 tablets by mouth every 6 (six)  hours as needed for headache. 20 tablet 0   clobetasol (TEMOVATE) 0.05 % GEL Apply small amount to affected area twice daily using finger. Gently pat dry with cotton gauze.  Do not eat,drink, or speak for 30 minutes to allow absorption. 30 g 2   clonazePAM (KLONOPIN) 1 MG tablet Take 1 tablet (1 mg total) by mouth 3 (three) times daily. 90 tablet 2   clotrimazole (MYCELEX) 10 MG troche Dissolve 1 tablet by mouth 2 times a day. Dissolve tablet slowly and completely on days you use topical steroid. 70 Troche 2   Continuous Blood Gluc Sensor (DEXCOM G6 SENSOR) MISC Replace every 10 days.  Use to check blood sugar. 9 each 1   Continuous Blood Gluc Transmit (DEXCOM G6 TRANSMITTER) MISC Use to check continuous blood sugar.  Replace every 90 days 1 each 3   cyclobenzaprine (FLEXERIL) 10 MG tablet Take 1 tablet (10 mg total) by mouth 3 (three) times daily as needed for muscle spasms. 30 tablet 0   ferrous sulfate 325 (65 FE) MG EC tablet Take 1 tablet (325 mg total) by mouth in the morning and at bedtime. Take iron supplement 2 hours before or 4 hours after PPI or other antacids as this requires some level of gastric acidity to aid in absorption 60 tablet 5   folic acid (FOLVITE) 1 MG tablet Take 1 tablet by mouth daily. 90 tablet 0   furosemide (LASIX) 40 MG tablet Take 1 tablet (40 mg total) by mouth daily. 30 tablet 3   glucose blood (FREESTYLE LITE) test strip Use as instructed 100 each 12   glucose blood (FREESTYLE LITE) test strip Check fasting blood sugar every morning and before meals up to 4 times a day as directed. 100 strip 12   hydrOXYzine (ATARAX) 25 MG tablet Take 1 tablet (25 mg total) by mouth 3 (three) times daily as needed for anxiety. 90 tablet 3   insulin degludec (TRESIBA FLEXTOUCH) 100 UNIT/ML FlexTouch Pen Start 43 units twice a day, increase by 3 units every time blood sugar prior to injection is above 120     insulin lispro (HUMALOG KWIKPEN) 100 UNIT/ML KwikPen Inject up to 16 units 3  times daily at mealtime per sliding scale. 15 mL 11   Insulin Pen Needle (UNIFINE PENTIPS) 31G X 5 MM MISC Use as directed to inject insulin 100 each 3   lamoTRIgine (LAMICTAL) 200 MG tablet Take 2 tablets by mouth at bedtime. 180 tablet 1   Lancets 30G MISC Check fasting blood sugar every morning and before meals up to 4 times daily. 100 each 12   lidocaine (XYLOCAINE) 5 % ointment Apply topically to affected areas 3 times daily as needed. 35.44 g 0   lisinopril (ZESTRIL) 10 MG tablet Take 1 tablet (10 mg total) by mouth daily. 90 tablet 3   meloxicam (MOBIC) 15 MG tablet Take 1 tablet by mouth once every morning with a meal for 2 weeks, then daily as needed for pain. 30 tablet 0   naproxen (NAPROSYN) 500 MG tablet Take 1 tablet (500 mg total) by mouth 2 (two) times daily with a meal. 30 tablet 0   neomycin-bacitracin-polymyxin [phone] OINT Apply topically 2 (two) times daily for 10 days To corners of mouth  14 g 2   nystatin (MYCOSTATIN) 100000 UNIT/ML suspension Use 11m to swish and swallow 4  times daily for 10 days 200 mL 0   nystatin (NYAMYC) powder Apply 3 times daily as directed. 60 g 3   nystatin ointment (MYCOSTATIN) Apply to the skin 2 times daily to corners of mouth 15 g 2   ondansetron (ZOFRAN-ODT) 4 MG disintegrating tablet Dissolve 1 tablet (4 mg total) by mouth 2 (two) times daily as needed for nausea or vomiting. 20 tablet 1   pantoprazole (PROTONIX) 40 MG tablet Take 2 tablets (80 mg total) by mouth 2 (two) times daily. 60 tablet 3   pimecrolimus (ELIDEL) 1 % cream Apply a small amount to affected area 2 x daily. Gently pat area dry w/cotton gauze. No eating, drinking or speaking for 20 - 30 minutes to allow medicine to be absorbed. Can apply to 2x2 gauze & placed against the affected area. 60 g 3   risperiDONE (RISPERDAL) 1 MG tablet Take 1.5 tablets (1.5 mg total) by mouth at bedtime. 135 tablet 0   rosuvastatin (CRESTOR) 20 MG tablet TAKE 1 TABLET BY MOUTH DAILY. 90 tablet 3    sertraline (ZOLOFT) 100 MG tablet Take 2 tablets (200 mg total) by mouth every morning. 180 tablet 3   spironolactone (ALDACTONE) 50 MG tablet Take 1 tablet (50 mg total) by mouth 2 (two) times daily. 180 tablet 2   SYNTHROID 300 MCG tablet TAKE 1 TABLET BY MOUTH ONCE A DAY BEFORE BREAKFAST 90 tablet 1   tirzepatide (MOUNJARO) 12.5 MG/0.5ML Pen Inject 12.5 mg into the skin once a week. 2 mL 0   triazolam (HALCION) 0.25 MG tablet 1-2 tabs PO 2 hours before procedure or imaging.  Do not drive with this medication. 2 tablet 0   Vitamin D, Ergocalciferol, (DRISDOL) 1.25 MG (50000 UNIT) CAPS capsule TAKE 1 CAPSULE BY MOUTH EVERY 7 DAYS. 12 capsule 1   No current facility-administered medications for this visit.    Medication Side Effects: None  Allergies:  Allergies  Allergen Reactions   Sulfa Antibiotics Itching, Swelling and Rash   Food     Walnut-mouth swelling/itching   Bupropion Hcl Itching and Rash   Codeine Itching   Sulfamethoxazole-Trimethoprim Rash    Past Medical History:  Diagnosis Date   Acanthosis nigricans, acquired 02/09/2015   Anxiety and depression 10/31/2014   Arthritis    back, knees, right elbow   Arthritis of left hip 06/25/2018   Bilateral swelling of feet and ankles    Bipolar disorder ([facility]    Borderline personality disorder ([facility]    Cancer ([facility]    oral   Carpal tunnel syndrome 09/06/2015   Chewing difficulty    Chronic left shoulder pain 05/17/2020   Constipation    Coronary artery disease 03/21/2021   COVID 10/08/2021   Cystic teratoma    BENIGN   Dental crowns present    Diabetes mellitus without complication ([facility]    Diabetes mellitus, type 2 ([facility] 06/10/2013   Diarrhea    Diverticulosis of colon without hemorrhage    DOE (dyspnea on exertion) 03/21/2021   Dyspepsia 12/14/2020   Essential hypertension 10/02/2007   Qualifier: Diagnosis of  By: ELoanne DrillingMD, Sean A    Facial rash 06/16/2019   Female hirsutism 02/09/2015   Food allergy    Walnuts   GAD  (generalized anxiety disorder)    Gastritis and gastroduodenitis    Gastroesophageal reflux disease    Grade II internal hemorrhoids  Greater trochanteric bursitis of left hip 07/24/2018   Hemorrhoids, internal 07/18/2017   History of kidney stones    History of migraine headaches    History of posttraumatic stress disorder (PTSD)    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/2015   Hypothyroidism    HYPOTHYROIDISM, POSTSURGICAL 10/02/2007   Qualifier: Diagnosis of  By: Loanne Drilling MD, Sean A    IBS (irritable bowel syndrome)    Infertility associated with anovulation 02/09/2015   Insulin resistance 02/09/2015   Joint pain    Leg swelling 2/84/1324   Lichen planus    Liver cirrhosis secondary to NASH (nonalcoholic steatohepatitis) ([place])    Lower back pain 07/18/2017   MDD (major depressive disorder) 06/06/2015   Migraine 10/02/2007   Morbid obesity with BMI of 40.0-44.9, adult ([place]) 06/23/2020   NASH (nonalcoholic steatohepatitis)    Nausea without vomiting    Nonalcoholic fatty liver disease 02/09/2015   Obstructive sleep apnea 06/27/2014   Oral mucosal lesion 06/17/2019   Osteoarthritis    Other fatigue    PCOS (polycystic ovarian syndrome)    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[place]) 01/01/2012   POLYCYSTIC OVARIAN DISEASE 10/02/2007   Qualifier: Diagnosis of  By: Loanne Drilling MD, Hilliard Clark A    PONV (postoperative nausea and vomiting)    also hx. 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   Qualifier: Diagnosis of  By: Loanne Drilling MD, Sean A    Trigeminal neuralgia    Unilateral primary osteoarthritis, left hip  02/10/2019   Vitamin D deficiency     Family History  Problem Relation Age of Onset   Heart disease Father    Depression Father    Heart attack Father    Sudden death Father    Anxiety disorder Father    Bipolar disorder Father    Obesity Father    Cancer Brother        THROAT cancer   Depression Brother    Anxiety disorder Brother    Depression Sister    Breast cancer Mother    Hypertension Mother    Cancer Mother    Depression Mother    Sleep apnea Mother    Obesity Mother    Depression Maternal Grandmother    Stroke Maternal Grandfather    Colon cancer Brother    Pancreatic cancer Neg Hx    Stomach cancer Neg Hx    Liver disease Neg Hx     Social History   Socioeconomic History   Marital status: Divorced    Spouse name: Not on file   Number of children: 0   Years of education: 16   Highest education level: Not on file  Occupational History   Occupation: RN-Telephone Copy    Employer: [employer]    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   Drug use: No   Sexual activity: Not Currently    Partners: Male    Birth control/protection: Condom  Other Topics Concern   Not on file  Social History Narrative   RN - [employer]   Regular exercise: no   Caffeine use: 2 x daily   Right-handed.   Lives alone.   Social Determinants of Health   Financial Resource Strain: Not on file  Food Insecurity: Not on file  Transportation Needs: Not on file  Physical Activity: Not on file  Stress: Not on file  Social Connections: Not on file   Intimate Partner Violence: Not on file    Past Medical History, Surgical history, Social history, and Family history were reviewed and updated as appropriate.   Please see review of systems for further details on the patient's review from today.   Objective:   Physical Exam:  LMP 08/24/2020   Physical Exam Constitutional:      General: She is not in acute distress. Musculoskeletal:        General: No deformity.  Neurological:     Mental Status: She is alert and oriented to person, place, and time.     Coordination: Coordination normal.  Psychiatric:        Attention and Perception: Attention and perception normal. She does not perceive auditory or visual hallucinations.        Mood and Affect: Mood normal. Mood is not anxious or depressed. Affect is not labile, blunt, angry or inappropriate.        Speech: Speech normal.        Behavior: Behavior normal.        Thought Content: Thought content normal. Thought content is not paranoid or delusional. Thought content does not include homicidal or suicidal ideation. Thought content does not include homicidal or suicidal plan.        Cognition and Memory: Cognition and memory normal.        Judgment: Judgment normal.     Comments: Insight intact     Lab Review:     Component Value Date/Time   NA 135 01/02/2022 0000   NA 140 10/11/2020 1041   K 4.2 01/02/2022 0000   CL 102 01/02/2022 0000   CO2 24 01/02/2022 0000   GLUCOSE 299 (H) 01/02/2022 0000   BUN 14 01/02/2022 0000   BUN 14 10/11/2020 1041   CREATININE 0.73 01/02/2022 0000   CALCIUM 9.5 01/02/2022 0000   PROT 6.8 01/02/2022 0000   PROT 7.1 10/11/2020 1041   ALBUMIN 3.8 02/11/2021 1516   ALBUMIN 4.2 10/11/2020 1041   AST 60 (H) 01/02/2022 0000   ALT 54 (H) 01/02/2022 0000   ALKPHOS 172 (H) 02/11/2021 1516   BILITOT 0.5 01/02/2022 0000   BILITOT <0.2 10/11/2020 1041   GFRNONAA >60 12/30/2021 1106   GFRNONAA 99 12/26/2020 0000   GFRAA 115 12/26/2020 0000        Component Value Date/Time   WBC 3.0 (L) 01/02/2022 0000   RBC 4.64 01/02/2022 0000   HGB 14.1 01/02/2022 0000   HGB 10.9 (L) 10/11/2020 1041   HCT 42.5 01/02/2022 0000   HCT 35.6 10/11/2020 1041   PLT 121 (L) 01/02/2022 0000   PLT 224 10/11/2020  1041   MCV 91.6 01/02/2022 0000   MCV 78 (L) 10/11/2020 1041   MCH 30.4 01/02/2022 0000   MCHC 33.2 01/02/2022 0000   RDW 13.9 01/02/2022 0000   RDW 17.5 (H) 10/11/2020 1041   LYMPHSABS 1,448 02/14/2021 0000   LYMPHSABS 1.7 10/11/2020 1041   MONOABS 0.3 02/11/2021 1516   EOSABS 141 02/14/2021 0000   EOSABS 0.2 10/11/2020 1041   BASOSABS 22 02/14/2021 0000   BASOSABS 0.0 10/11/2020 1041    No results found for: "POCLITH", "LITHIUM"   No results found for: "PHENYTOIN", "PHENOBARB", "VALPROATE", "CBMZ"   .res Assessment: Plan:    Plan:  PDMP reviewed  Risperdal 1.5 mg at hs  Lamictal '200mg'$  daily Zoloft '100mg'$  - 2 daily Nuvigil '200mg'$  daily - take every day Clonazepam '1mg'$  Hydroxyzine '25mg'$  TID - using 1 tablet at bedtime.   Time spent with patient was 25 minutes. Greater than 50% of face to face time with patient was spent on counseling and coordination of care.      [employer] - therapist  RTC 3 months  Patient advised to contact office with any questions, adverse effects, or acute worsening in signs and symptoms.  Counseled patient regarding potential benefits, risks, and side effects of Lamictal to include potential risk of Stevens-Johnson syndrome. Advised patient to stop taking Lamictal and contact office immediately if rash develops and to seek urgent medical attention if rash is severe and/or spreading quickly.   Discussed potential benefits, risk, and side effects of benzodiazepines to include potential risk of tolerance and dependence, as well as possible drowsiness. Advised patient not to drive if experiencing drowsiness and to take lowest possible effective dose to minimize risk of dependence and  tolerance.  Discussed potential metabolic side effects associated with atypical antipsychotics, as well as potential risk for movement side effects. Advised pt to contact office if movement side effects occur.    Diagnoses and all orders for this visit:  GAD (generalized anxiety disorder)  PTSD (post-traumatic stress disorder)  Panic attacks  Borderline personality disorder (HCC)  Severe episode of recurrent major depressive disorder, without psychotic features ([place]) -     lamoTRIgine (LAMICTAL) 200 MG tablet; Take 2 tablets by mouth at bedtime.     Please see After Visit Summary for patient specific instructions.  Future Appointments  Date Time Provider [place]  09/12/2022  4:15 PM Silverio Decamp, MD [facility] None    No orders of the defined types were placed in this encounter.     -------------------------------

## 2022-08-13 ENCOUNTER — Ambulatory Visit: Payer: Commercial Managed Care - PPO | Admitting: Sports Medicine

## 2022-08-13 ENCOUNTER — Other Ambulatory Visit (HOSPITAL_COMMUNITY): Payer: Self-pay

## 2022-08-20 DIAGNOSIS — M25511 Pain in right shoulder: Secondary | ICD-10-CM | POA: Diagnosis not present

## 2022-08-20 DIAGNOSIS — M75101 Unspecified rotator cuff tear or rupture of right shoulder, not specified as traumatic: Secondary | ICD-10-CM | POA: Diagnosis not present

## 2022-08-21 DIAGNOSIS — M75101 Unspecified rotator cuff tear or rupture of right shoulder, not specified as traumatic: Secondary | ICD-10-CM | POA: Insufficient documentation

## 2022-08-22 ENCOUNTER — Ambulatory Visit (INDEPENDENT_AMBULATORY_CARE_PROVIDER_SITE_OTHER): Payer: Commercial Managed Care - PPO | Admitting: Family Medicine

## 2022-08-22 ENCOUNTER — Encounter: Payer: Self-pay | Admitting: Family Medicine

## 2022-08-22 VITALS — BP 136/71 | HR 87 | Ht 71.0 in | Wt 303.0 lb

## 2022-08-22 DIAGNOSIS — H66002 Acute suppurative otitis media without spontaneous rupture of ear drum, left ear: Secondary | ICD-10-CM

## 2022-08-22 MED ORDER — AMOXICILLIN-POT CLAVULANATE 875-125 MG PO TABS: 1.0000 | ORAL_TABLET | Freq: Two times a day (BID) | ORAL | 0 refills | Status: DC

## 2022-08-22 NOTE — Assessment & Plan Note (Addendum)
Presents with left ear pain radiating toward the jaw.  TM with erythema and injected.  No signs of perforation. No fever or hearing loss. No mastoid pain or external ear pain. Augmentin 875-125 mg BID for 10 days.

## 2022-08-22 NOTE — Progress Notes (Signed)
Acute Office Visit  Subjective:     Patient ID: Tracy [name], female    DOB: February 01, 1966, 57 y.o.   MRN: [id number]  Chief Complaint  Patient presents with   Ear Pain    HPI Patient is in today for reports falling off of porch on 12/19 onto left side and rolled to right side. Reports left ear pain that is radiating toward upper jaw. Felt like jaw is "out of alignment last night" does not feel out of alignment today, just sore with opening and closing. Left ear pain and left jaw started hurting right after the fall. Eating soft foods currently due to pain with chewing. Ear does not hurt constantly. Pain is deep in ear.  Tried ice and heat, cold roller, Tylenol for pain.  No NSAIDs, due to surgery coming up for torn rotator cuff.   Review of Systems  Constitutional:  Negative for chills and fever.  HENT:  Positive for ear pain. Negative for congestion, ear discharge and hearing loss.   Eyes:  Negative for blurred vision and double vision.  Respiratory:  Negative for cough and shortness of breath.   Cardiovascular:  Negative for chest pain.  Gastrointestinal:  Negative for nausea and vomiting.  Musculoskeletal:  Positive for joint pain (left sided jaw "soreness" with chewing).        Objective:    LMP 08/24/2020    Physical Exam Vitals and nursing note reviewed.  Constitutional:      General: She is not in acute distress.    Appearance: Normal appearance. She is obese. She is not ill-appearing.  HENT:     Left Ear: Ear canal and external ear normal. No drainage. No mastoid tenderness. Tympanic membrane is injected and erythematous.     Nose: Nose normal.     Mouth/Throat:     Mouth: Mucous membranes are moist.     Pharynx: Oropharynx is clear. Uvula midline. No oropharyngeal exudate or posterior oropharyngeal erythema.     Comments: No bony tenderness in left jaw. ROM in jaw intact.  Cardiovascular:     Rate and Rhythm: Regular rhythm.     Heart sounds: Normal heart  sounds.  Pulmonary:     Effort: Pulmonary effort is normal.     Breath sounds: Normal breath sounds.  Skin:    General: Skin is warm and dry.  Neurological:     General: No focal deficit present.     Mental Status: She is alert. Mental status is at baseline.  Psychiatric:        Mood and Affect: Mood normal.        Behavior: Behavior normal.        Thought Content: Thought content normal.        Judgment: Judgment normal.     No results found for any visits on 08/22/22.      Assessment & Plan:   Problem List Items Addressed This Visit     Non-recurrent acute suppurative otitis media of left ear without spontaneous rupture of tympanic membrane - Primary    Presents with left ear pain radiating toward the jaw.  TM with erythema and injected.  No signs of perforation. No fever or hearing loss. Augmentin 875-125 mg BID for 10 days.        Relevant Medications   amoxicillin-clavulanate (AUGMENTIN) 875-125 MG tablet    Meds ordered this encounter  Medications   amoxicillin-clavulanate (AUGMENTIN) 875-125 MG tablet    Sig: Take 1 tablet by mouth  2 (two) times daily.    Dispense:  20 tablet    Refill:  0    Order Specific Question:   Supervising Provider    Answer:   Leeanne Rio [[id number]]  ROM intact to jaw, no bony tenderness, no indication for x-ray today.  Agrees with plan of care discussed.  Questions answered.   Return if symptoms worsen or fail to improve.  Chalmers Guest, FNP

## 2022-08-22 NOTE — Patient Instructions (Signed)
You were prescribed an antibiotic today. Please complete the full course.  Acetaminophen  for pain according to package instructions, do not exceed recommended doses.  Adequate fluids to avoid dehydration. Lots of rest while you are recovering.  Follow-up if your symptoms do not improve.

## 2022-08-29 ENCOUNTER — Telehealth: Payer: Commercial Managed Care - PPO | Admitting: Urgent Care

## 2022-08-29 ENCOUNTER — Encounter: Payer: Self-pay | Admitting: Family Medicine

## 2022-08-29 DIAGNOSIS — U071 COVID-19: Secondary | ICD-10-CM | POA: Diagnosis not present

## 2022-08-29 MED ORDER — FLUCONAZOLE 150 MG PO TABS: 150.0000 mg | ORAL_TABLET | Freq: Once | ORAL | 0 refills | Status: AC

## 2022-08-29 MED ORDER — MOLNUPIRAVIR EUA 200MG CAPSULE: 4.0000 | ORAL_CAPSULE | Freq: Two times a day (BID) | ORAL | 0 refills | Status: AC

## 2022-08-29 NOTE — Progress Notes (Signed)
Virtual Visit Consent   [name], you are scheduled for a virtual visit with a [facility] provider today. Just as with appointments in the office, your consent must be obtained to participate. Your consent will be active for this visit and any virtual visit you may have with one of our providers in the next 365 days. If you have a MyChart account, a copy of this consent can be sent to you electronically.  As this is a virtual visit, video technology does not allow for your provider to perform a traditional examination. This may limit your provider's ability to fully assess your condition. If your provider identifies any concerns that need to be evaluated in person or the need to arrange testing (such as labs, EKG, etc.), we will make arrangements to do so. Although advances in technology are sophisticated, we cannot ensure that it will always work on either your end or our end. If the connection with a video visit is poor, the visit may have to be switched to a telephone visit. With either a video or telephone visit, we are not always able to ensure that we have a secure connection.  By engaging in this virtual visit, you consent to the provision of healthcare and authorize for your insurance to be billed (if applicable) for the services provided during this visit. Depending on your insurance coverage, you may receive a charge related to this service.  I need to obtain your verbal consent now. Are you willing to proceed with your visit today? Tracy [name] has provided verbal consent on 08/29/2022 for a virtual visit (video or telephone). [name], PA  Date: 08/29/2022 5:29 PM  Virtual Visit via Video Note   I, Tracy [name], connected with  EWELINA NAVES  ([id number], 12-10-1965) on 08/29/22 at  5:15 PM EST by a video-enabled telemedicine application and verified that I am speaking with the correct person using two identifiers.  Location: Patient: Virtual Visit Location Patient:  Home Provider: Virtual Visit Location Provider: Home Office   I discussed the limitations of evaluation and management by telemedicine and the availability of in person appointments. The patient expressed understanding and agreed to proceed.    History of Present Illness: Tracy [name] is a 57 y.o. who identifies as a female who was assigned female at birth, and is being seen today for covid.  HPI: 36 pt with numerous comorbidities presents today with worsening fatigue, muscle aches and severe nasal congestion. She was dx with an ear infection last week and is completing a course of Augmentin currently. Yesterday, she states she was abruptly hit with fatigue, body aches, productive cough, headache and nasal congestion. Went to [facility] today and was tested for both the flu and covid. States her covid test was positive, flu test negative. Pt requesting antiviral therapy. Denies severe SOB, CP, palps.   Problems:  Patient Active Problem List   Diagnosis Date Noted   Non-recurrent acute suppurative otitis media of left ear without spontaneous rupture of tympanic membrane 08/22/2022   Localized, primary osteoarthritis of the ankle and foot, left 07/31/2022   Hearing loss due to cerumen impaction, left 07/31/2022   Rotator cuff tear arthropathy of right shoulder 07/25/2022   COVID 10/08/2021   Arthritis 08/09/2021   Bilateral swelling of feet and ankles 08/09/2021   Cancer ([place]) 08/09/2021   Chewing difficulty 08/09/2021   Constipation 08/09/2021   Dental crowns present 08/09/2021   Diabetes mellitus without complication ([place]) 44/10/4740  Food allergy 08/09/2021   History of kidney stones 08/09/2021   History of migraine headaches 08/09/2021   Hypothyroidism 08/09/2021   IBS (irritable bowel syndrome) 08/09/2021   Joint pain 73/22/0254   Lichen planus 27/01/2375   Liver cirrhosis secondary to NASH (nonalcoholic steatohepatitis) ([place]) 08/09/2021   NASH (nonalcoholic steatohepatitis)  08/09/2021   Osteoarthritis 08/09/2021   Other fatigue 08/09/2021   PONV (postoperative nausea and vomiting) 08/09/2021   Shortness of breath on exertion 08/09/2021   DOE (dyspnea on exertion) 03/21/2021   Coronary artery disease 03/21/2021   Diarrhea    Gastroesophageal reflux disease    Gastritis and gastroduodenitis    Diverticulosis of colon without hemorrhage    Grade II internal hemorrhoids    Dyspepsia 12/14/2020   Leg swelling 09/21/2020   Morbid obesity with BMI of 40.0-44.9, adult ([place]) 06/23/2020   Chronic left shoulder pain 05/17/2020   Oral mucosal lesion 06/17/2019   Facial rash 06/16/2019   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   Unilateral primary osteoarthritis, left hip 02/10/2019   Greater trochanteric bursitis of left hip 07/24/2018   Arthritis of left hip 06/25/2018   Primary osteoarthritis of left knee 01/21/2018   Vitamin D deficiency 01/04/2018   Lower back pain 07/18/2017   Hemorrhoids, internal 07/18/2017   Trigeminal neuralgia 01/18/2016   Bipolar disorder ([place]) 11/28/2015   Borderline personality disorder ([place]) 11/28/2015   Carpal tunnel syndrome 09/06/2015   MDD (major depressive disorder) 06/06/2015   Severe episode of recurrent major depressive disorder, without psychotic features ([place])    History of posttraumatic stress disorder (PTSD)    GAD (generalized anxiety disorder)    Insulin resistance 02/09/2015   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/2015   Acanthosis nigricans, acquired 28/31/5176   Nonalcoholic fatty liver disease 02/09/2015   Female hirsutism 02/09/2015   Infertility associated with anovulation 02/09/2015   Anxiety and depression 10/31/2014   Obstructive sleep apnea 06/27/2014   Diabetes mellitus, type 2 ([place]) 06/10/2013   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[place]) 01/01/2012   Cystic teratoma    PCOS (polycystic ovarian syndrome)    TOBACCO USE, QUIT 02/08/2010   HYPOTHYROIDISM, POSTSURGICAL 10/02/2007   POLYCYSTIC OVARIAN DISEASE  10/02/2007   Treatment-resistant depression 10/02/2007   Migraine 10/02/2007   Essential hypertension 10/02/2007   RENAL CALCULUS, HX OF 10/02/2007    Allergies:  Allergies  Allergen Reactions   Sulfa Antibiotics Itching, Swelling and Rash   Food     [place]-mouth swelling/itching   Bupropion Hcl Itching and Rash   Codeine Itching   Sulfamethoxazole-Trimethoprim Rash   Medications:  Current Outpatient Medications:    molnupiravir EUA (LAGEVRIO) 200 mg CAPS capsule, Take 4 capsules (800 mg total) by mouth 2 (two) times daily for 5 days., Disp: 40 capsule, Rfl: 0   albuterol (VENTOLIN HFA) 108 (90 Base) MCG/ACT inhaler, Inhale 2 puffs into the lungs every 6 (six) hours as needed for wheezing or shortness of breath., Disp: 8 g, Rfl: 3   amoxicillin-clavulanate (AUGMENTIN) 875-125 MG tablet, Take 1 tablet by mouth 2 (two) times daily., Disp: 20 tablet, Rfl: 0   Armodafinil 200 MG TABS, Take 1 Tablet by mouth daily as needed (sleepiness)., Disp: 30 tablet, Rfl: 2   Blood Glucose Monitoring Suppl (FREESTYLE LITE) w/Device KIT, Use as directed., Disp: 1 kit, Rfl: 0   Blood Glucose Monitoring Suppl (FREESTYLE LITE) w/Device KIT, Check fasting blood sugar every morning and before meals up to 4 times daily., Disp: 1 kit, Rfl: PRN   butalbital-acetaminophen-caffeine (FIORICET) 50-325-40 MG tablet,  Take 1-2 tablets by mouth every 6 (six) hours as needed for headache., Disp: 20 tablet, Rfl: 0   clobetasol (TEMOVATE) 0.05 % GEL, Apply small amount to affected area twice daily using finger. Gently pat dry with cotton gauze.  Do not eat,drink, or speak for 30 minutes to allow absorption., Disp: 30 g, Rfl: 2   clonazePAM (KLONOPIN) 1 MG tablet, Take 1 tablet (1 mg total) by mouth 3 (three) times daily., Disp: 90 tablet, Rfl: 2   clotrimazole (MYCELEX) 10 MG troche, Dissolve 1 tablet by mouth 2 times a day. Dissolve tablet slowly and completely on days you use topical steroid., Disp: 70 Troche, Rfl: 2    Continuous Blood Gluc Sensor (DEXCOM G6 SENSOR) MISC, Replace every 10 days.  Use to check blood sugar., Disp: 9 each, Rfl: 1   Continuous Blood Gluc Transmit (DEXCOM G6 TRANSMITTER) MISC, Use to check continuous blood sugar.  Replace every 90 days, Disp: 1 each, Rfl: 3   cyclobenzaprine (FLEXERIL) 10 MG tablet, Take 1 tablet (10 mg total) by mouth 3 (three) times daily as needed for muscle spasms., Disp: 30 tablet, Rfl: 0   ferrous sulfate 325 (65 FE) MG EC tablet, Take 1 tablet (325 mg total) by mouth in the morning and at bedtime. Take iron supplement 2 hours before or 4 hours after PPI or other antacids as this requires some level of gastric acidity to aid in absorption, Disp: 60 tablet, Rfl: 5   fluconazole (DIFLUCAN) 150 MG tablet, Take 1 tablet (150 mg total) by mouth once for 1 dose., Disp: 1 tablet, Rfl: 0   folic acid (FOLVITE) 1 MG tablet, Take 1 tablet by mouth daily., Disp: 90 tablet, Rfl: 0   furosemide (LASIX) 40 MG tablet, Take 1 tablet (40 mg total) by mouth daily., Disp: 30 tablet, Rfl: 3   glucose blood (FREESTYLE LITE) test strip, Use as instructed, Disp: 100 each, Rfl: 12   glucose blood (FREESTYLE LITE) test strip, Check fasting blood sugar every morning and before meals up to 4 times a day as directed., Disp: 100 strip, Rfl: 12   hydrOXYzine (ATARAX) 25 MG tablet, Take 1 tablet (25 mg total) by mouth 3 (three) times daily as needed for anxiety., Disp: 90 tablet, Rfl: 3   insulin degludec (TRESIBA FLEXTOUCH) 100 UNIT/ML FlexTouch Pen, Start 43 units twice a day, increase by 3 units every time blood sugar prior to injection is above 120, Disp: , Rfl:    insulin lispro (HUMALOG KWIKPEN) 100 UNIT/ML KwikPen, Inject up to 16 units 3 times daily at mealtime per sliding scale., Disp: 15 mL, Rfl: 11   Insulin Pen Needle (UNIFINE PENTIPS) 31G X 5 MM MISC, Use as directed to inject insulin, Disp: 100 each, Rfl: 3   lamoTRIgine (LAMICTAL) 200 MG tablet, Take 2 tablets by mouth at bedtime.,  Disp: 180 tablet, Rfl: 1   Lancets 30G MISC, Check fasting blood sugar every morning and before meals up to 4 times daily., Disp: 100 each, Rfl: 12   lidocaine (XYLOCAINE) 5 % ointment, Apply topically to affected areas 3 times daily as needed., Disp: 35.44 g, Rfl: 0   lisinopril (ZESTRIL) 10 MG tablet, Take 1 tablet (10 mg total) by mouth daily., Disp: 90 tablet, Rfl: 3   meloxicam (MOBIC) 15 MG tablet, Take 1 tablet by mouth once every morning with a meal for 2 weeks, then daily as needed for pain., Disp: 30 tablet, Rfl: 0   naproxen (NAPROSYN) 500 MG tablet, Take 1  tablet (500 mg total) by mouth 2 (two) times daily with a meal., Disp: 30 tablet, Rfl: 0   neomycin-bacitracin-polymyxin [phone] OINT, Apply topically 2 (two) times daily for 10 days To corners of mouth, Disp: 14 g, Rfl: 2   nystatin (MYCOSTATIN) 100000 UNIT/ML suspension, Use 24m to swish and swallow 4  times daily for 10 days, Disp: 200 mL, Rfl: 0   nystatin (NYAMYC) powder, Apply 3 times daily as directed., Disp: 60 g, Rfl: 3   nystatin ointment (MYCOSTATIN), Apply to the skin 2 times daily to corners of mouth, Disp: 15 g, Rfl: 2   ondansetron (ZOFRAN-ODT) 4 MG disintegrating tablet, Dissolve 1 tablet (4 mg total) by mouth 2 (two) times daily as needed for nausea or vomiting., Disp: 20 tablet, Rfl: 1   pantoprazole (PROTONIX) 40 MG tablet, Take 2 tablets (80 mg total) by mouth 2 (two) times daily., Disp: 60 tablet, Rfl: 3   pimecrolimus (ELIDEL) 1 % cream, Apply a small amount to affected area 2 x daily. Gently pat area dry w/cotton gauze. No eating, drinking or speaking for 20 - 30 minutes to allow medicine to be absorbed. Can apply to 2x2 gauze & placed against the affected area., Disp: 60 g, Rfl: 3   risperiDONE (RISPERDAL) 1 MG tablet, Take 1.5 tablets (1.5 mg total) by mouth at bedtime., Disp: 135 tablet, Rfl: 0   rosuvastatin (CRESTOR) 20 MG tablet, TAKE 1 TABLET BY MOUTH DAILY., Disp: 90 tablet, Rfl: 3   sertraline  (ZOLOFT) 100 MG tablet, Take 2 tablets (200 mg total) by mouth every morning., Disp: 180 tablet, Rfl: 3   spironolactone (ALDACTONE) 50 MG tablet, Take 1 tablet (50 mg total) by mouth 2 (two) times daily., Disp: 180 tablet, Rfl: 2   SYNTHROID 300 MCG tablet, TAKE 1 TABLET BY MOUTH ONCE A DAY BEFORE BREAKFAST, Disp: 90 tablet, Rfl: 1   tirzepatide (MOUNJARO) 12.5 MG/0.5ML Pen, Inject 12.5 mg into the skin once a week., Disp: 2 mL, Rfl: 0   triazolam (HALCION) 0.25 MG tablet, 1-2 tabs PO 2 hours before procedure or imaging.  Do not drive with this medication., Disp: 2 tablet, Rfl: 0   Vitamin D, Ergocalciferol, (DRISDOL) 1.25 MG (50000 UNIT) CAPS capsule, TAKE 1 CAPSULE BY MOUTH EVERY 7 DAYS., Disp: 12 capsule, Rfl: 1  Observations/Objective: Patient is well-developed, well-nourished in no acute distress.  Resting laying flat in a chair at home. Acutely ill but non-toxic Head is normocephalic, atraumatic.  No labored breathing. Wet cough intermittently. No accessory muscle use or cyanosis Speech is clear and coherent with logical content.  Patient is alert and oriented at baseline.  No rhinorrhea or scleral injection  Assessment and Plan: 1. COVID-19  Will start molnupiravir due to concern for several medication interactions as well as no up to date GFR. Pt on day two of covid. Will also have pt use mucinex, tylenol and flonase. Continue augmentin.  Follow Up Instructions: I discussed the assessment and treatment plan with the patient. The patient was provided an opportunity to ask questions and all were answered. The patient agreed with the plan and demonstrated an understanding of the instructions.  A copy of instructions were sent to the patient via MyChart unless otherwise noted below.    The patient was advised to call back or seek an in-person evaluation if the symptoms worsen or if the condition fails to improve as anticipated.  Time:  I spent 6 minutes with the patient via  telehealth technology discussing the above  problems/concerns.    [place], PA

## 2022-08-29 NOTE — Telephone Encounter (Signed)
Patient states she is going to hold off on scheduling - she was just seen at [facility] and tested positive for Covid. Diflucan has been sent to patient's requested pharmacy

## 2022-08-29 NOTE — Patient Instructions (Addendum)
Sampson Goon, thank you for joining [name], PA for today's virtual visit.  While this provider is not your primary care provider (PCP), if your PCP is located in our provider database this encounter information will be shared with them immediately following your visit.   [facility] account gives you access to today's visit and all your visits, tests, and labs performed at [facility] " click here if you don't have a [facility] account or go to [email or web address]  Consent: (Patient) Tracy [name] provided verbal consent for this virtual visit at the beginning of the encounter.  Current Medications:  Current Outpatient Medications:    molnupiravir EUA (LAGEVRIO) 200 mg CAPS capsule, Take 4 capsules (800 mg total) by mouth 2 (two) times daily for 5 days., Disp: 40 capsule, Rfl: 0   albuterol (VENTOLIN HFA) 108 (90 Base) MCG/ACT inhaler, Inhale 2 puffs into the lungs every 6 (six) hours as needed for wheezing or shortness of breath., Disp: 8 g, Rfl: 3   amoxicillin-clavulanate (AUGMENTIN) 875-125 MG tablet, Take 1 tablet by mouth 2 (two) times daily., Disp: 20 tablet, Rfl: 0   Armodafinil 200 MG TABS, Take 1 Tablet by mouth daily as needed (sleepiness)., Disp: 30 tablet, Rfl: 2   Blood Glucose Monitoring Suppl (FREESTYLE LITE) w/Device KIT, Use as directed., Disp: 1 kit, Rfl: 0   Blood Glucose Monitoring Suppl (FREESTYLE LITE) w/Device KIT, Check fasting blood sugar every morning and before meals up to 4 times daily., Disp: 1 kit, Rfl: PRN   butalbital-acetaminophen-caffeine (FIORICET) 50-325-40 MG tablet, Take 1-2 tablets by mouth every 6 (six) hours as needed for headache., Disp: 20 tablet, Rfl: 0   clobetasol (TEMOVATE) 0.05 % GEL, Apply small amount to affected area twice daily using finger. Gently pat dry with cotton gauze.  Do not eat,drink, or speak for 30 minutes to allow absorption., Disp: 30 g, Rfl: 2   clonazePAM (KLONOPIN) 1 MG tablet,  Take 1 tablet (1 mg total) by mouth 3 (three) times daily., Disp: 90 tablet, Rfl: 2   clotrimazole (MYCELEX) 10 MG troche, Dissolve 1 tablet by mouth 2 times a day. Dissolve tablet slowly and completely on days you use topical steroid., Disp: 70 Troche, Rfl: 2   Continuous Blood Gluc Sensor (DEXCOM G6 SENSOR) MISC, Replace every 10 days.  Use to check blood sugar., Disp: 9 each, Rfl: 1   Continuous Blood Gluc Transmit (DEXCOM G6 TRANSMITTER) MISC, Use to check continuous blood sugar.  Replace every 90 days, Disp: 1 each, Rfl: 3   cyclobenzaprine (FLEXERIL) 10 MG tablet, Take 1 tablet (10 mg total) by mouth 3 (three) times daily as needed for muscle spasms., Disp: 30 tablet, Rfl: 0   ferrous sulfate 325 (65 FE) MG EC tablet, Take 1 tablet (325 mg total) by mouth in the morning and at bedtime. Take iron supplement 2 hours before or 4 hours after PPI or other antacids as this requires some level of gastric acidity to aid in absorption, Disp: 60 tablet, Rfl: 5   fluconazole (DIFLUCAN) 150 MG tablet, Take 1 tablet (150 mg total) by mouth once for 1 dose., Disp: 1 tablet, Rfl: 0   folic acid (FOLVITE) 1 MG tablet, Take 1 tablet by mouth daily., Disp: 90 tablet, Rfl: 0   furosemide (LASIX) 40 MG tablet, Take 1 tablet (40 mg total) by mouth daily., Disp: 30 tablet, Rfl: 3   glucose blood (FREESTYLE LITE) test strip, Use as instructed, Disp: 100 each, Rfl:  12   glucose blood (FREESTYLE LITE) test strip, Check fasting blood sugar every morning and before meals up to 4 times a day as directed., Disp: 100 strip, Rfl: 12   hydrOXYzine (ATARAX) 25 MG tablet, Take 1 tablet (25 mg total) by mouth 3 (three) times daily as needed for anxiety., Disp: 90 tablet, Rfl: 3   insulin degludec (TRESIBA FLEXTOUCH) 100 UNIT/ML FlexTouch Pen, Start 43 units twice a day, increase by 3 units every time blood sugar prior to injection is above 120, Disp: , Rfl:    insulin lispro (HUMALOG KWIKPEN) 100 UNIT/ML KwikPen, Inject up to 16  units 3 times daily at mealtime per sliding scale., Disp: 15 mL, Rfl: 11   Insulin Pen Needle (UNIFINE PENTIPS) 31G X 5 MM MISC, Use as directed to inject insulin, Disp: 100 each, Rfl: 3   lamoTRIgine (LAMICTAL) 200 MG tablet, Take 2 tablets by mouth at bedtime., Disp: 180 tablet, Rfl: 1   Lancets 30G MISC, Check fasting blood sugar every morning and before meals up to 4 times daily., Disp: 100 each, Rfl: 12   lidocaine (XYLOCAINE) 5 % ointment, Apply topically to affected areas 3 times daily as needed., Disp: 35.44 g, Rfl: 0   lisinopril (ZESTRIL) 10 MG tablet, Take 1 tablet (10 mg total) by mouth daily., Disp: 90 tablet, Rfl: 3   meloxicam (MOBIC) 15 MG tablet, Take 1 tablet by mouth once every morning with a meal for 2 weeks, then daily as needed for pain., Disp: 30 tablet, Rfl: 0   naproxen (NAPROSYN) 500 MG tablet, Take 1 tablet (500 mg total) by mouth 2 (two) times daily with a meal., Disp: 30 tablet, Rfl: 0   neomycin-bacitracin-polymyxin [phone] OINT, Apply topically 2 (two) times daily for 10 days To corners of mouth, Disp: 14 g, Rfl: 2   nystatin (MYCOSTATIN) 100000 UNIT/ML suspension, Use 61m to swish and swallow 4  times daily for 10 days, Disp: 200 mL, Rfl: 0   nystatin (NYAMYC) powder, Apply 3 times daily as directed., Disp: 60 g, Rfl: 3   nystatin ointment (MYCOSTATIN), Apply to the skin 2 times daily to corners of mouth, Disp: 15 g, Rfl: 2   ondansetron (ZOFRAN-ODT) 4 MG disintegrating tablet, Dissolve 1 tablet (4 mg total) by mouth 2 (two) times daily as needed for nausea or vomiting., Disp: 20 tablet, Rfl: 1   pantoprazole (PROTONIX) 40 MG tablet, Take 2 tablets (80 mg total) by mouth 2 (two) times daily., Disp: 60 tablet, Rfl: 3   pimecrolimus (ELIDEL) 1 % cream, Apply a small amount to affected area 2 x daily. Gently pat area dry w/cotton gauze. No eating, drinking or speaking for 20 - 30 minutes to allow medicine to be absorbed. Can apply to 2x2 gauze & placed against the  affected area., Disp: 60 g, Rfl: 3   risperiDONE (RISPERDAL) 1 MG tablet, Take 1.5 tablets (1.5 mg total) by mouth at bedtime., Disp: 135 tablet, Rfl: 0   rosuvastatin (CRESTOR) 20 MG tablet, TAKE 1 TABLET BY MOUTH DAILY., Disp: 90 tablet, Rfl: 3   sertraline (ZOLOFT) 100 MG tablet, Take 2 tablets (200 mg total) by mouth every morning., Disp: 180 tablet, Rfl: 3   spironolactone (ALDACTONE) 50 MG tablet, Take 1 tablet (50 mg total) by mouth 2 (two) times daily., Disp: 180 tablet, Rfl: 2   SYNTHROID 300 MCG tablet, TAKE 1 TABLET BY MOUTH ONCE A DAY BEFORE BREAKFAST, Disp: 90 tablet, Rfl: 1   tirzepatide (MOUNJARO) 12.5 MG/0.5ML Pen, Inject  12.5 mg into the skin once a week., Disp: 2 mL, Rfl: 0   triazolam (HALCION) 0.25 MG tablet, 1-2 tabs PO 2 hours before procedure or imaging.  Do not drive with this medication., Disp: 2 tablet, Rfl: 0   Vitamin D, Ergocalciferol, (DRISDOL) 1.25 MG (50000 UNIT) CAPS capsule, TAKE 1 CAPSULE BY MOUTH EVERY 7 DAYS., Disp: 12 capsule, Rfl: 1   Medications ordered in this encounter:  Meds ordered this encounter  Medications   molnupiravir EUA (LAGEVRIO) 200 mg CAPS capsule    Sig: Take 4 capsules (800 mg total) by mouth 2 (two) times daily for 5 days.    Dispense:  40 capsule    Refill:  0    Order Specific Question:   Supervising Provider    Answer:   Chase Picket [id number]     *If you need refills on other medications prior to your next appointment, please contact your pharmacy*  Follow-Up: Call back or seek an in-person evaluation if the symptoms worsen or if the condition fails to improve as anticipated.  [place] [phone]  Other Instructions Start taking molnupiravir 4 capsules twice daily until gone (5 days total). Start your first dose this evening, then twice daily starting tomorrow. We do not have a recent renal function lab therefore paxlovid not recommended. Please also purchase OTC mucinex. Take this twice daily with  plenty of water to help thin your mucous. Use OTC flonase to help with your nasal congestion and ear pain. Continue and complete your course of Augmentin as previously prescribed. Use tylenol as needed for aches or fever. If you develop severe shortness of breath or chest pain, please head to the emergency room.   If you have been instructed to have an in-person evaluation today at a local Urgent Care facility, please use the link below. It will take you to a list of all of our available [facility], including address, phone number and hours of operation. Please do not delay care.  [facility]  If you or a family member do not have a primary care provider, use the link below to schedule a visit and establish care. When you choose a [facility] primary care physician or advanced practice provider, you gain a long-term partner in health. Find a Primary Care Provider  Learn more about [facility] in-office and virtual care options: [facility]

## 2022-09-05 ENCOUNTER — Ambulatory Visit: Payer: Commercial Managed Care - PPO | Admitting: Sports Medicine

## 2022-09-05 ENCOUNTER — Ambulatory Visit (INDEPENDENT_AMBULATORY_CARE_PROVIDER_SITE_OTHER): Payer: Commercial Managed Care - PPO

## 2022-09-05 DIAGNOSIS — F3181 Bipolar II disorder: Secondary | ICD-10-CM | POA: Diagnosis not present

## 2022-09-05 DIAGNOSIS — M1712 Unilateral primary osteoarthritis, left knee: Secondary | ICD-10-CM

## 2022-09-05 MED ORDER — TRIAMCINOLONE ACETONIDE 40 MG/ML IJ SUSP
40.0000 mg | Freq: Once | INTRAMUSCULAR | Status: AC
  Administered 2022-09-05: 40 mg via INTRAMUSCULAR

## 2022-09-05 MED ORDER — FERROUS SULFATE 325 (65 FE) MG PO TBEC
325.0000 mg | DELAYED_RELEASE_TABLET | Freq: Two times a day (BID) | ORAL | 5 refills | Status: DC
  Filled 2022-09-05 – 2022-11-29 (×7): qty 100, 50d supply, fill #0
  Filled 2023-04-15: qty 100, 50d supply, fill #1

## 2022-09-05 NOTE — Progress Notes (Signed)
    Procedures performed today:    Procedure: Real-time Ultrasound Guided injection of the left knee Device: Samsung HS60  Verbal informed consent obtained.  Time-out conducted.  Noted no overlying erythema, induration, or other signs of local infection.  Skin prepped in a sterile fashion.  Local anesthesia: Topical Ethyl chloride.  With sterile technique and under real time ultrasound guidance: Trace effusion noted, 1 cc Kenalog 40, 2 cc lidocaine, 2 cc bupivacaine injected easily Completed without difficulty  Advised to call if fevers/chills, erythema, induration, drainage, or persistent bleeding.  Images permanently stored and available for review in PACS.  Impression: Technically successful ultrasound guided injection.  Independent interpretation of notes and tests performed by another provider:   None.  Brief History, Exam, Impression, and Recommendations:    Primary osteoarthritis of left knee Increasing pain left knee, known osteoarthritis, on exam she has anterior pain, mild effusion but no pain with terminal flexion and a negative McMurray's sign. Knee is otherwise stable. Suspect more arthritic changes. 2 knee injections ago her sugar went up to 500 necessitating an ER visit, fortunately she was able to get [facility], her last knee injection did not drive her sugar up at all. This was in August, repeat left knee injection today. She does have her right rotator cuff repair coming up.    ____________________________________________ [name], M.D., ABFM., CAQSM., [name]. Primary Care and Sports Medicine [facility]  Adjunct Professor of [employer] [facility] [employer] [employer]  Risk manager

## 2022-09-05 NOTE — Assessment & Plan Note (Signed)
Increasing pain left knee, known osteoarthritis, on exam she has anterior pain, mild effusion but no pain with terminal flexion and a negative McMurray's sign. Knee is otherwise stable. Suspect more arthritic changes. 2 knee injections ago her sugar went up to 500 necessitating an ER visit, fortunately she was able to get [facility], her last knee injection did not drive her sugar up at all. This was in August, repeat left knee injection today. She does have her right rotator cuff repair coming up.

## 2022-09-06 ENCOUNTER — Other Ambulatory Visit: Payer: Self-pay | Admitting: Family Medicine

## 2022-09-06 ENCOUNTER — Other Ambulatory Visit: Payer: Self-pay

## 2022-09-06 ENCOUNTER — Other Ambulatory Visit (HOSPITAL_COMMUNITY): Payer: Self-pay

## 2022-09-06 ENCOUNTER — Encounter (INDEPENDENT_AMBULATORY_CARE_PROVIDER_SITE_OTHER): Payer: Commercial Managed Care - PPO | Admitting: Sports Medicine

## 2022-09-06 DIAGNOSIS — Z794 Long term (current) use of insulin: Secondary | ICD-10-CM

## 2022-09-06 DIAGNOSIS — E1165 Type 2 diabetes mellitus with hyperglycemia: Secondary | ICD-10-CM | POA: Diagnosis not present

## 2022-09-06 MED ORDER — TIRZEPATIDE 15 MG/0.5ML ~~LOC~~ SOAJ
15.0000 mg | SUBCUTANEOUS | 3 refills | Status: DC
  Filled 2022-09-06: qty 2, 28d supply, fill #0

## 2022-09-06 NOTE — Telephone Encounter (Signed)
Please see the MyChart message reply(ies) for my assessment and plan.    This patient gave consent for this Medical Advice Message and is aware that it may result in a bill to their insurance company, as well as the possibility of receiving a bill for a co-payment or deductible. They are an established patient, but are not seeking medical advice exclusively about a problem treated during an in person or video visit in the last seven days. I did not recommend an in person or video visit within seven days of my reply.    I spent a total of 8 minutes cumulative time within 7 days through MyChart messaging.  Akeem Heppler, DO   

## 2022-09-09 ENCOUNTER — Encounter: Payer: Self-pay | Admitting: Sports Medicine

## 2022-09-09 ENCOUNTER — Other Ambulatory Visit (HOSPITAL_COMMUNITY): Payer: Self-pay

## 2022-09-09 ENCOUNTER — Encounter: Payer: Self-pay | Admitting: Family Medicine

## 2022-09-09 ENCOUNTER — Telehealth: Payer: Commercial Managed Care - PPO

## 2022-09-09 ENCOUNTER — Other Ambulatory Visit: Payer: Self-pay

## 2022-09-09 ENCOUNTER — Other Ambulatory Visit (INDEPENDENT_AMBULATORY_CARE_PROVIDER_SITE_OTHER): Payer: Self-pay | Admitting: Family Medicine

## 2022-09-09 DIAGNOSIS — H8112 Benign paroxysmal vertigo, left ear: Secondary | ICD-10-CM | POA: Diagnosis not present

## 2022-09-09 DIAGNOSIS — E538 Deficiency of other specified B group vitamins: Secondary | ICD-10-CM

## 2022-09-09 DIAGNOSIS — H6992 Unspecified Eustachian tube disorder, left ear: Secondary | ICD-10-CM | POA: Diagnosis not present

## 2022-09-09 MED ORDER — FOLIC ACID 1 MG PO TABS
1.0000 mg | ORAL_TABLET | Freq: Every day | ORAL | 0 refills | Status: DC
  Filled 2022-09-09: qty 30, 30d supply, fill #0

## 2022-09-10 ENCOUNTER — Other Ambulatory Visit (HOSPITAL_COMMUNITY): Payer: Self-pay

## 2022-09-10 ENCOUNTER — Other Ambulatory Visit: Payer: Self-pay

## 2022-09-11 ENCOUNTER — Other Ambulatory Visit: Payer: Self-pay

## 2022-09-11 ENCOUNTER — Other Ambulatory Visit (HOSPITAL_COMMUNITY): Payer: Self-pay

## 2022-09-11 ENCOUNTER — Other Ambulatory Visit: Payer: Self-pay | Admitting: Family Medicine

## 2022-09-11 DIAGNOSIS — E559 Vitamin D deficiency, unspecified: Secondary | ICD-10-CM

## 2022-09-11 MED ORDER — HYDROCODONE-ACETAMINOPHEN 10-325 MG PO TABS: 1.0000 | ORAL_TABLET | Freq: Three times a day (TID) | ORAL | 0 refills | Status: DC | PRN

## 2022-09-12 ENCOUNTER — Ambulatory Visit: Payer: Commercial Managed Care - PPO | Admitting: Sports Medicine

## 2022-09-20 DIAGNOSIS — F3181 Bipolar II disorder: Secondary | ICD-10-CM | POA: Diagnosis not present

## 2022-09-24 ENCOUNTER — Other Ambulatory Visit: Payer: Self-pay

## 2022-09-24 ENCOUNTER — Other Ambulatory Visit (HOSPITAL_COMMUNITY): Payer: Self-pay

## 2022-09-25 ENCOUNTER — Other Ambulatory Visit: Payer: Self-pay

## 2022-09-25 ENCOUNTER — Other Ambulatory Visit (HOSPITAL_COMMUNITY): Payer: Self-pay

## 2022-09-26 ENCOUNTER — Encounter: Payer: Self-pay | Admitting: Family Medicine

## 2022-09-26 DIAGNOSIS — K117 Disturbances of salivary secretion: Secondary | ICD-10-CM | POA: Diagnosis not present

## 2022-09-26 DIAGNOSIS — L438 Other lichen planus: Secondary | ICD-10-CM | POA: Diagnosis not present

## 2022-09-26 DIAGNOSIS — B370 Candidal stomatitis: Secondary | ICD-10-CM | POA: Diagnosis not present

## 2022-09-27 ENCOUNTER — Other Ambulatory Visit (HOSPITAL_COMMUNITY): Payer: Self-pay

## 2022-09-27 ENCOUNTER — Other Ambulatory Visit (HOSPITAL_BASED_OUTPATIENT_CLINIC_OR_DEPARTMENT_OTHER): Payer: Self-pay

## 2022-09-27 MED ORDER — NYSTATIN 100000 UNIT/ML MT SUSP
OROMUCOSAL | 0 refills | Status: DC
  Filled 2022-09-27: qty 180, 9d supply, fill #0
  Filled 2022-10-04: qty 180, 10d supply, fill #0

## 2022-09-30 ENCOUNTER — Other Ambulatory Visit (HOSPITAL_COMMUNITY): Payer: Self-pay

## 2022-09-30 ENCOUNTER — Encounter: Payer: Self-pay | Admitting: Family Medicine

## 2022-09-30 ENCOUNTER — Other Ambulatory Visit: Payer: Self-pay

## 2022-09-30 MED ORDER — TIRZEPATIDE 12.5 MG/0.5ML ~~LOC~~ SOAJ
12.5000 mg | SUBCUTANEOUS | 1 refills | Status: DC
  Filled 2022-09-30 – 2022-10-08 (×2): qty 2, 28d supply, fill #0
  Filled 2022-11-20: qty 2, 28d supply, fill #1
  Filled 2023-01-12: qty 2, 28d supply, fill #2
  Filled 2023-02-09: qty 2, 28d supply, fill #3
  Filled 2023-03-10: qty 2, 28d supply, fill #4

## 2022-10-03 DIAGNOSIS — F3181 Bipolar II disorder: Secondary | ICD-10-CM | POA: Diagnosis not present

## 2022-10-04 ENCOUNTER — Other Ambulatory Visit: Payer: Self-pay

## 2022-10-04 ENCOUNTER — Encounter: Payer: Self-pay | Admitting: Family Medicine

## 2022-10-04 ENCOUNTER — Other Ambulatory Visit (HOSPITAL_COMMUNITY): Payer: Self-pay

## 2022-10-07 ENCOUNTER — Encounter: Payer: Self-pay | Admitting: Pharmacist

## 2022-10-07 ENCOUNTER — Other Ambulatory Visit: Payer: Self-pay

## 2022-10-07 ENCOUNTER — Other Ambulatory Visit (HOSPITAL_COMMUNITY): Payer: Self-pay

## 2022-10-07 ENCOUNTER — Encounter: Payer: Self-pay | Admitting: Family Medicine

## 2022-10-08 ENCOUNTER — Other Ambulatory Visit (HOSPITAL_COMMUNITY): Payer: Self-pay

## 2022-10-08 ENCOUNTER — Other Ambulatory Visit: Payer: Self-pay

## 2022-10-08 ENCOUNTER — Telehealth: Payer: Self-pay

## 2022-10-08 ENCOUNTER — Telehealth: Payer: Commercial Managed Care - PPO

## 2022-10-08 NOTE — Telephone Encounter (Signed)
Initiated Prior authorization [id number] [id number] Transmitter Via: Covermymeds Case/Key:[id number] Status: Pending as of 10/08/22 Reason: Notified Pt via: Mychart   Initiated Prior authorization [id number] 12.'5MG'$ /0.5ML pen-injectors Via: Covermymeds Case/Key:[id number] Status: approved  as of 10/08/22 Reason:The authorization is effective from 10/08/2022 to 10/08/2023 Notified Pt via: Mychart

## 2022-10-10 DIAGNOSIS — L438 Other lichen planus: Secondary | ICD-10-CM | POA: Diagnosis not present

## 2022-10-10 DIAGNOSIS — K0889 Other specified disorders of teeth and supporting structures: Secondary | ICD-10-CM | POA: Diagnosis not present

## 2022-10-10 NOTE — Progress Notes (Addendum)
COVID Vaccine received:  '[]'$  No '[x]'$  Yes Date of any COVID positive Test in last 90 days: 08-29-2022  very mild case no residual problems.   PCP -Luetta Nutting, MD Cardiologist -   Chest x-ray - 02-11-2021  1v  epic EKG -  (02-11-2021 epic)  will repeat at PST Stress Test -  ECHO - 03-28-2021  epic Cardiac Cath -  CT coronary-  Score 263  in 03-29-21   Epic  PCR screen: '[]'$  Ordered & Completed                      '[]'$   No Order but Needs PROFEND                      '[x]'$   N/A for this surgery  Surgery Plan:  '[x]'$  Ambulatory                            '[]'$  Outpatient in bed                            '[]'$  Admit  Anesthesia:    '[x]'$  General  '[]'$  Spinal                           '[]'$   Choice '[]'$   MAC   Pacemaker / ICD device '[x]'$  No '[]'$  Yes        Device order form faxed '[x]'$  No    '[]'$   Yes      Faxed to:  Spinal Cord Stimulator:'[x]'$  No '[]'$  Yes      (Remind patient to bring remote DOS) Other Implants:   History of Sleep Apnea? '[]'$  No '[x]'$  Yes   CPAP used?- '[]'$  No '[x]'$  Yes    Does the patient monitor blood sugar? '[]'$  No '[x]'$  Yes  '[]'$  N/A   Patient has: '[x]'$  Pre-DM   '[]'$  DM1  '[]'$   DM2 Does patient have a [employer] or Dexacom? '[x]'$  No '[]'$  Yes   waiting on DEXCOM to come  Fasting Blood Sugar Ranges-  Checks Blood Sugar __1___ times a day  GLP1 agonist-  Monujaro  on Saturdays GLP1 instructions:   Other Diabetic medications/ instructions:  Insulin Lispro (Humalog)  sliding scale up to 16 units tid ac  Blood Thinner / Instructions: none Aspirin Instructions:none  ERAS Protocol Ordered: '[]'$  No  '[x]'$  Yes PRE-SURGERY '[]'$  ENSURE  '[x]'$  G2  Patient is to be NPO after: 12:15 pm  Comments: Patient is a Therapist, sports at [employer].  A Baribed has been requested by email.   Activity level: Patient is able to climb a flight of stairs without difficulty; '[x]'$  No CP  but would have _SOB   Patient can perform ADLs without assistance.   Anesthesia review: Pre-DM (Dexcom is pending insurance), OSA-CPAP, CAD (ca+ score 263),  NASH, PONV, trigeminal neuralgia (remission now), GAD. Patient has hx emergence delirium in past surgeries (combative).   Patient denies shortness of breath, fever, cough and chest pain at PAT appointment.  Patient verbalized understanding and agreement to the Pre-Surgical Instructions that were given to them at this PAT appointment. Patient was also educated of the need to review these PAT instructions again prior to her surgery.I reviewed the appropriate phone numbers to call if they have any and questions or concerns.

## 2022-10-11 ENCOUNTER — Encounter (HOSPITAL_COMMUNITY): Payer: Self-pay

## 2022-10-11 ENCOUNTER — Other Ambulatory Visit: Payer: Self-pay

## 2022-10-11 ENCOUNTER — Encounter (HOSPITAL_COMMUNITY)
Admission: RE | Admit: 2022-10-11 | Discharge: 2022-10-11 | Disposition: A | Discharge status: Home / Self Care | Payer: Commercial Managed Care - PPO | Source: Ambulatory Visit | Attending: Orthopedic Surgery | Admitting: Orthopedic Surgery

## 2022-10-11 VITALS — BP 116/72 | HR 68 | Temp 98.2°F | Resp 18 | Ht 70.0 in | Wt 308.0 lb

## 2022-10-11 DIAGNOSIS — R7303 Prediabetes: Secondary | ICD-10-CM | POA: Insufficient documentation

## 2022-10-11 DIAGNOSIS — I2510 Atherosclerotic heart disease of native coronary artery without angina pectoris: Secondary | ICD-10-CM | POA: Insufficient documentation

## 2022-10-11 DIAGNOSIS — K7460 Unspecified cirrhosis of liver: Secondary | ICD-10-CM | POA: Diagnosis not present

## 2022-10-11 DIAGNOSIS — Z01818 Encounter for other preprocedural examination: Secondary | ICD-10-CM | POA: Diagnosis not present

## 2022-10-11 DIAGNOSIS — K7581 Nonalcoholic steatohepatitis (NASH): Secondary | ICD-10-CM | POA: Insufficient documentation

## 2022-10-11 DIAGNOSIS — I493 Ventricular premature depolarization: Secondary | ICD-10-CM | POA: Diagnosis not present

## 2022-10-11 LAB — COMPREHENSIVE METABOLIC PANEL
ALT: 28 U/L (ref 0–44)
AST: 24 U/L (ref 15–41)
Albumin: 3.7 g/dL (ref 3.5–5.0)
Alkaline Phosphatase: 102 U/L (ref 38–126)
Anion gap: 9 (ref 5–15)
BUN: 11 mg/dL (ref 6–20)
CO2: 22 mmol/L (ref 22–32)
Calcium: 8.8 mg/dL — ABNORMAL LOW (ref 8.9–10.3)
Chloride: 106 mmol/L (ref 98–111)
Creatinine, Ser: 0.69 mg/dL (ref 0.44–1.00)
GFR, Estimated: >60 mL/min (ref >60)
Glucose, Bld: 80 mg/dL (ref 70–99)
Potassium: 3.4 mmol/L — ABNORMAL LOW (ref 3.5–5.1)
Sodium: 137 mmol/L (ref 135–145)
Total Bilirubin: 0.5 mg/dL (ref 0.3–1.2)
Total Protein: 7.1 g/dL (ref 6.5–8.1)

## 2022-10-11 LAB — CBC
HCT: 40.1 % (ref 36.0–46.0)
Hemoglobin: 13.5 g/dL (ref 12.0–15.0)
MCH: 30.1 pg (ref 26.0–34.0)
MCHC: 33.7 g/dL (ref 30.0–36.0)
MCV: 89.3 fL (ref 80.0–100.0)
Platelets: 152 10*3/uL (ref 150–400)
RBC: 4.49 MIL/uL (ref 3.87–5.11)
RDW: 14.8 % (ref 11.5–15.5)
WBC: 6 10*3/uL (ref 4.0–10.5)
nRBC: 0 % (ref 0.0–0.2)

## 2022-10-11 NOTE — Patient Instructions (Addendum)
SURGICAL WAITING ROOM VISITATION Patients having surgery or a procedure may have no more than 2 support people in the waiting area - these visitors may rotate in the visitor waiting room.   Due to an increase in RSV and influenza rates and associated hospitalizations, children ages 62 and under may not visit patients in [place]. If the patient needs to stay at the hospital during part of their recovery, the visitor guidelines for inpatient rooms apply.  PRE-OP VISITATION  Pre-op nurse will coordinate an appropriate time for 1 support person to accompany the patient in pre-op.  This support person may not rotate.  This visitor will be contacted when the time is appropriate for the visitor to come back in the pre-op area.  Please refer to the [facility] website for the visitor guidelines for Inpatients (after your surgery is over and you are in a regular room).  You are not required to quarantine at this time prior to your surgery. However, you must do this: Hand Hygiene often Do NOT share personal items Notify your provider if you are in close contact with someone who has COVID or you develop fever 100.4 or greater, new onset of sneezing, cough, sore throat, shortness of breath or body aches.  If you test positive for Covid or have been in contact with anyone that has tested positive in the last 10 days please notify you surgeon.    Your procedure is scheduled on:  Thursday  October 17, 2022  Report to [facility] Main Entrance: [place] entrance where the [employer] is available.   Report to admitting at: 12:45  PM  +++++Call this number if you have any questions or problems the morning of surgery [phone]  How to Manage Your Diabetes Before and After Surgery  Why is it important to control my blood sugar before and after surgery? Improving blood sugar levels before and after surgery helps healing and can limit problems. A way of improving blood sugar control  is eating a healthy diet by:  Eating less sugar and carbohydrates  Increasing activity/exercise  Talking with your doctor about reaching your blood sugar goals High blood sugars (greater than 180 mg/dL) can raise your risk of infections and slow your recovery, so you will need to focus on controlling your diabetes during the weeks before surgery. Make sure that the doctor who takes care of your diabetes knows about your planned surgery including the date and location.  How do I manage my blood sugar before surgery? Check your blood sugar at least 4 times a day, starting 2 days before surgery, to make sure that the level is not too high or low. Check your blood sugar the morning of your surgery when you wake up and every 2 hours until you get to the Short Stay unit. If your blood sugar is less than 70 mg/dL, you will need to treat for low blood sugar: Do not take insulin. Treat a low blood sugar (less than 70 mg/dL) with  cup of clear juice (cranberry or apple), 4 glucose tablets, OR glucose gel. Recheck blood sugar in 15 minutes after treatment (to make sure it is greater than 70 mg/dL). If your blood sugar is not greater than 70 mg/dL on recheck, call [phone] for further instructions. Report your blood sugar to the short stay nurse when you get to Short Stay.  If you are admitted to the hospital after surgery: Your blood sugar will be checked by the staff and  you will probably be given insulin after surgery (instead of oral diabetes medicines) to make sure you have good blood sugar levels. The goal for blood sugar control after surgery is 80-180 mg/dL.   WHAT DO I DO ABOUT MY DIABETES MEDICATION?  Mounjaro: Last injection: 10-05-22. Do not inject on Saturday 10-12-22, you may resume injections the following Saturday 10-19-22  THE DAY/ NIGHT BEFORE SURGERY- Insulin Lispro (Humalog)  Take usual dose before meals but do not take any bedtime dose.   THE MORNING OF SURGERY- Insulin Lispro  (Humalog) If your CBG is greater than 220 mg/dL, you may take  of your sliding scale  (correction) dose of insulin.    IF you have any questions, call the nurse at [phone]   Do not eat food after Midnight the night prior to your surgery/procedure.  After Midnight you may have the following liquids until 12:15 PM DAY OF SURGERY  Clear Liquid Diet Water Black Coffee (sugar ok, NO MILK/CREAM OR CREAMERS)  Tea (sugar ok, NO MILK/CREAM OR CREAMERS) regular and decaf                             Plain Jell-O  with no fruit (NO RED)                                           Fruit ices (not with fruit pulp, NO RED)                                     Popsicles (NO RED)                                                                  Juice: apple, WHITE grape, WHITE cranberry Sports drinks like Gatorade or Powerade (NO RED)                    The day of surgery:  Drink ONE (1) Pre-Surgery G2 at  12:15 PM the morning of surgery. Drink in one sitting. Do not sip.  This drink was given to you during your hospital pre-op appointment visit. Nothing else to drink after completing the Pre-Surgery G2 : No candy, chewing gum or throat lozenges.    FOLLOW  ANY ADDITIONAL PRE OP INSTRUCTIONS YOU RECEIVED FROM YOUR SURGEON'S OFFICE!!!   Oral Hygiene is also important to reduce your risk of infection.        Remember - BRUSH YOUR TEETH THE MORNING OF SURGERY WITH YOUR REGULAR TOOTHPASTE  Do NOT smoke after Midnight the night before surgery.  Take ONLY these medicines the morning of surgery with A SIP OF WATER: clonazepam (Klonopin), sertraline (Zoloft), Synthroid, Pantoprazole (Protonix) and you may use your Flonase nasal spray if needed.  You may take your Tylenol and Hydroxyzine if needed.                     You may not have any metal on your body including hair pins, jewelry, and body piercing  Do not wear make-up,  lotions, powders, perfumes or deodorant  Do not wear nail polish  including gel and S&S, artificial / acrylic nails, or any other type of covering on natural nails including finger and toenails. If you have artificial nails, gel coating, etc., that needs to be removed by a nail salon, Please have this removed prior to surgery. Not doing so may mean that your surgery could be cancelled or delayed if the Surgeon or anesthesia staff feels like they are unable to monitor you safely.   Do not shave 48 hours prior to surgery to avoid nicks in your skin which may contribute to postoperative infections.    Contacts, Hearing Aids, dentures or bridgework may not be worn into surgery. DENTURES WILL BE REMOVED PRIOR TO SURGERY PLEASE DO NOT APPLY "Poly grip" OR ADHESIVES!!!  You may bring a small overnight bag with you on the day of surgery, only pack items that are not valuable. [facility] IS NOT RESPONSIBLE   FOR VALUABLES THAT ARE LOST OR STOLEN.   Patients discharged on the day of surgery will not be allowed to drive home.  Someone NEEDS to stay with you for the first 24 hours after anesthesia.  Do not bring your home medications to the hospital. The Pharmacy will dispense medications listed on your medication list to you during your admission in the Hospital.  Special Instructions: Bring a copy of your healthcare power of attorney and living will documents the day of surgery, if you wish to have them scanned into your [facility] Records- EPIC  Please read over the following fact sheets you were given: IF YOU HAVE QUESTIONS ABOUT YOUR [place], [place] [phone].   [place] - Preparing for Surgery Before surgery, you can play an important role.  Because skin is not sterile, your skin needs to be as free of germs as possible.  You can reduce the number of germs on your skin by washing with CHG (chlorahexidine gluconate) soap before surgery.  CHG is an antiseptic cleaner which kills germs and bonds with the skin to continue killing germs even  after washing. Please DO NOT use if you have an allergy to CHG or antibacterial soaps.  If your skin becomes reddened/irritated stop using the CHG and inform your nurse when you arrive at Short Stay. Do not shave (including legs and underarms) for at least 48 hours prior to the first CHG shower.  You may shave your face/neck.  Please follow these instructions carefully:  1.  Shower with CHG Soap the night before surgery and the  morning of surgery.  2.  If you choose to wash your hair, wash your hair first as usual with your normal  shampoo.  3.  After you shampoo, rinse your hair and body thoroughly to remove the shampoo.                             4.  Use CHG as you would any other liquid soap.  You can apply chg directly to the skin and wash.  Gently with a scrungie or clean washcloth.  5.  Apply the CHG Soap to your body ONLY FROM THE NECK DOWN.   Do not use on face/ open                           Wound or open sores. Avoid contact with eyes, ears mouth and genitals (private parts).  Wash face,  Genitals (private parts) with your normal soap.             6.  Wash thoroughly, paying special attention to the area where your  surgery  will be performed.  7.  Thoroughly rinse your body with warm water from the neck down.  8.  DO NOT shower/wash with your normal soap after using and rinsing off the CHG Soap.            9.  Pat yourself dry with a clean towel.            10.  Wear clean pajamas.            11.  Place clean sheets on your bed the night of your first shower and do not  sleep with pets.  ON THE DAY OF SURGERY : Do not apply any lotions/deodorants the morning of surgery.  Please wear clean clothes to the hospital/surgery center.   Preparing for Total Shoulder Arthroplasty ================================================================= Please follow these instructions carefully, in addition to any other special Bathing information that was explained to you  at the Presurgical Appointment:  BENZOYL PEROXIDE 5% GEL: Used to kill bacteria on the skin which could cause an infection at the surgery site.   Please do not use if you have an allergy to benzoyl peroxide. If your skin becomes reddened/irritated stop using the benzoyl peroxide and inform your Doctor.   Starting two days before surgery, apply as follows:  1. Apply benzoyl peroxide gel in the morning and at night. Apply after taking a shower. If you are not taking a shower, clean entire shoulder front, back, and side, along with the armpit with a clean wet washcloth.  2. Place a quarter-sized dollop of the gel on your SHOULDER and rub in thoroughly, making sure to cover the front, back, and side of your shoulder, along with the armpit.   2 Days prior to Surgery First Application _______ Morning Second Application _______ Night  Day Before Surgery First Application______ Morning  On the night before surgery, wash your entire body (except hair, face and private areas) with CHG Soap. THEN, rub in the LAST application of the Benzoyl Peroxide Gel on your shoulder.   3. On the Morning of Surgery wash your BODY AGAIN with CHG Soap (except hair, face and private areas)  4. DO NOT USE THE BENZOYL PEROXIDE GEL ON THE DAY OF YOUR SURGERY         FAILURE TO FOLLOW THESE INSTRUCTIONS MAY RESULT IN THE CANCELLATION OF YOUR SURGERY  PATIENT SIGNATURE_________________________________  NURSE SIGNATURE__________________________________  ________________________________________________________________________        Adam Phenix    An incentive spirometer is a tool that can help keep your lungs clear and active. This tool measures how well you are filling your lungs with each breath. Taking long deep breaths may help reverse or decrease the chance of developing breathing (pulmonary) problems (especially infection) following: A long period of time when you are unable to move or  be active. BEFORE THE PROCEDURE  If the spirometer includes an indicator to show your best effort, your nurse or respiratory therapist will set it to a desired goal. If possible, sit up straight or lean slightly forward. Try not to slouch. Hold the incentive spirometer in an upright position. INSTRUCTIONS FOR USE  Sit on the edge of your bed if possible, or sit up as far as you can in bed or on a chair. Hold the incentive spirometer  in an upright position. Breathe out normally. Place the mouthpiece in your mouth and seal your lips tightly around it. Breathe in slowly and as deeply as possible, raising the piston or the ball toward the top of the column. Hold your breath for 3-5 seconds or for as long as possible. Allow the piston or ball to fall to the bottom of the column. Remove the mouthpiece from your mouth and breathe out normally. Rest for a few seconds and repeat Steps 1 through 7 at least 10 times every 1-2 hours when you are awake. Take your time and take a few normal breaths between deep breaths. The spirometer may include an indicator to show your best effort. Use the indicator as a goal to work toward during each repetition. After each set of 10 deep breaths, practice coughing to be sure your lungs are clear. If you have an incision (the cut made at the time of surgery), support your incision when coughing by placing a pillow or rolled up towels firmly against it. Once you are able to get out of bed, walk around indoors and cough well. You may stop using the incentive spirometer when instructed by your caregiver.  RISKS AND COMPLICATIONS Take your time so you do not get dizzy or light-headed. If you are in pain, you may need to take or ask for pain medication before doing incentive spirometry. It is harder to take a deep breath if you are having pain. AFTER USE Rest and breathe slowly and easily. It can be helpful to keep track of a log of your progress. Your caregiver can provide  you with a simple table to help with this. If you are using the spirometer at home, follow these instructions: [place] IF:  You are having difficultly using the spirometer. You have trouble using the spirometer as often as instructed. Your pain medication is not giving enough relief while using the spirometer. You develop fever of 100.5 F (38.1 C) or higher.                                                                                                    SEEK IMMEDIATE MEDICAL CARE IF:  You cough up bloody sputum that had not been present before. You develop fever of 102 F (38.9 C) or greater. You develop worsening pain at or near the incision site. MAKE SURE YOU:  Understand these instructions. Will watch your condition. Will get help right away if you are not doing well or get worse. Document Released: 12/02/2006 Document Revised: 10/14/2011 Document Reviewed: 02/02/2007 [facility] Patient Information 2014 [place], [place].

## 2022-10-12 ENCOUNTER — Other Ambulatory Visit (HOSPITAL_COMMUNITY): Payer: Self-pay

## 2022-10-12 LAB — HEMOGLOBIN A1C
Hgb A1c MFr Bld: 6.5 % — ABNORMAL HIGH (ref 4.8–5.6)
Mean Plasma Glucose: 140 mg/dL

## 2022-10-15 ENCOUNTER — Other Ambulatory Visit: Payer: Self-pay

## 2022-10-16 ENCOUNTER — Encounter: Payer: Self-pay | Admitting: Sports Medicine

## 2022-10-17 ENCOUNTER — Ambulatory Visit (HOSPITAL_BASED_OUTPATIENT_CLINIC_OR_DEPARTMENT_OTHER): Payer: Commercial Managed Care - PPO | Admitting: Anesthesiology

## 2022-10-17 ENCOUNTER — Other Ambulatory Visit: Payer: Self-pay

## 2022-10-17 ENCOUNTER — Encounter (HOSPITAL_COMMUNITY): Payer: Self-pay | Admitting: Orthopedic Surgery

## 2022-10-17 ENCOUNTER — Encounter (HOSPITAL_COMMUNITY)
Admission: RE | Disposition: A | Discharge status: Home / Self Care | Payer: Self-pay | Source: Home / Self Care | Attending: Orthopedic Surgery

## 2022-10-17 ENCOUNTER — Ambulatory Visit (HOSPITAL_COMMUNITY): Payer: Commercial Managed Care - PPO | Admitting: Physician Assistant

## 2022-10-17 ENCOUNTER — Ambulatory Visit (HOSPITAL_COMMUNITY)
Admission: RE | Admit: 2022-10-17 | Discharge: 2022-10-17 | Disposition: A | Discharge status: Home / Self Care | Payer: Commercial Managed Care - PPO | Attending: Orthopedic Surgery | Admitting: Orthopedic Surgery

## 2022-10-17 DIAGNOSIS — G8918 Other acute postprocedural pain: Secondary | ICD-10-CM | POA: Diagnosis not present

## 2022-10-17 DIAGNOSIS — K7460 Unspecified cirrhosis of liver: Secondary | ICD-10-CM | POA: Diagnosis not present

## 2022-10-17 DIAGNOSIS — M7522 Bicipital tendinitis, left shoulder: Secondary | ICD-10-CM | POA: Diagnosis not present

## 2022-10-17 DIAGNOSIS — G8929 Other chronic pain: Secondary | ICD-10-CM | POA: Diagnosis not present

## 2022-10-17 DIAGNOSIS — Z79899 Other long term (current) drug therapy: Secondary | ICD-10-CM | POA: Diagnosis not present

## 2022-10-17 DIAGNOSIS — G4733 Obstructive sleep apnea (adult) (pediatric): Secondary | ICD-10-CM | POA: Diagnosis not present

## 2022-10-17 DIAGNOSIS — E890 Postprocedural hypothyroidism: Secondary | ICD-10-CM | POA: Diagnosis not present

## 2022-10-17 DIAGNOSIS — I10 Essential (primary) hypertension: Secondary | ICD-10-CM | POA: Insufficient documentation

## 2022-10-17 DIAGNOSIS — Z7985 Long-term (current) use of injectable non-insulin antidiabetic drugs: Secondary | ICD-10-CM | POA: Diagnosis not present

## 2022-10-17 DIAGNOSIS — M19011 Primary osteoarthritis, right shoulder: Secondary | ICD-10-CM

## 2022-10-17 DIAGNOSIS — F411 Generalized anxiety disorder: Secondary | ICD-10-CM | POA: Insufficient documentation

## 2022-10-17 DIAGNOSIS — K219 Gastro-esophageal reflux disease without esophagitis: Secondary | ICD-10-CM | POA: Diagnosis not present

## 2022-10-17 DIAGNOSIS — M1712 Unilateral primary osteoarthritis, left knee: Secondary | ICD-10-CM

## 2022-10-17 DIAGNOSIS — Z794 Long term (current) use of insulin: Secondary | ICD-10-CM | POA: Insufficient documentation

## 2022-10-17 DIAGNOSIS — M65811 Other synovitis and tenosynovitis, right shoulder: Secondary | ICD-10-CM

## 2022-10-17 DIAGNOSIS — E119 Type 2 diabetes mellitus without complications: Secondary | ICD-10-CM

## 2022-10-17 DIAGNOSIS — I2510 Atherosclerotic heart disease of native coronary artery without angina pectoris: Secondary | ICD-10-CM | POA: Diagnosis not present

## 2022-10-17 DIAGNOSIS — F603 Borderline personality disorder: Secondary | ICD-10-CM | POA: Diagnosis not present

## 2022-10-17 DIAGNOSIS — S46011A Strain of muscle(s) and tendon(s) of the rotator cuff of right shoulder, initial encounter: Secondary | ICD-10-CM | POA: Insufficient documentation

## 2022-10-17 DIAGNOSIS — Z8616 Personal history of COVID-19: Secondary | ICD-10-CM | POA: Insufficient documentation

## 2022-10-17 DIAGNOSIS — S43431A Superior glenoid labrum lesion of right shoulder, initial encounter: Secondary | ICD-10-CM | POA: Diagnosis not present

## 2022-10-17 DIAGNOSIS — G500 Trigeminal neuralgia: Secondary | ICD-10-CM | POA: Insufficient documentation

## 2022-10-17 DIAGNOSIS — F319 Bipolar disorder, unspecified: Secondary | ICD-10-CM | POA: Diagnosis not present

## 2022-10-17 DIAGNOSIS — F4310 Post-traumatic stress disorder, unspecified: Secondary | ICD-10-CM | POA: Diagnosis not present

## 2022-10-17 DIAGNOSIS — Z6841 Body Mass Index (BMI) 40.0 and over, adult: Secondary | ICD-10-CM | POA: Insufficient documentation

## 2022-10-17 DIAGNOSIS — M75101 Unspecified rotator cuff tear or rupture of right shoulder, not specified as traumatic: Secondary | ICD-10-CM

## 2022-10-17 DIAGNOSIS — Z8249 Family history of ischemic heart disease and other diseases of the circulatory system: Secondary | ICD-10-CM | POA: Insufficient documentation

## 2022-10-17 DIAGNOSIS — Z87891 Personal history of nicotine dependence: Secondary | ICD-10-CM | POA: Insufficient documentation

## 2022-10-17 DIAGNOSIS — Z8349 Family history of other endocrine, nutritional and metabolic diseases: Secondary | ICD-10-CM | POA: Insufficient documentation

## 2022-10-17 DIAGNOSIS — K7581 Nonalcoholic steatohepatitis (NASH): Secondary | ICD-10-CM | POA: Insufficient documentation

## 2022-10-17 DIAGNOSIS — M7541 Impingement syndrome of right shoulder: Secondary | ICD-10-CM | POA: Diagnosis not present

## 2022-10-17 DIAGNOSIS — W19XXXA Unspecified fall, initial encounter: Secondary | ICD-10-CM | POA: Insufficient documentation

## 2022-10-17 DIAGNOSIS — M25811 Other specified joint disorders, right shoulder: Secondary | ICD-10-CM | POA: Diagnosis not present

## 2022-10-17 DIAGNOSIS — R7303 Prediabetes: Secondary | ICD-10-CM

## 2022-10-17 HISTORY — PX: SHOULDER ARTHROSCOPY WITH ROTATOR CUFF REPAIR: SHX5685

## 2022-10-17 LAB — GLUCOSE, CAPILLARY
Glucose-Capillary: 102 mg/dL — ABNORMAL HIGH (ref 70–99)
Glucose-Capillary: 96 mg/dL (ref 70–99)

## 2022-10-17 SURGERY — ARTHROSCOPY, SHOULDER, WITH ROTATOR CUFF REPAIR
Anesthesia: Regional | Laterality: Right

## 2022-10-17 MED ORDER — LIDOCAINE HCL (PF) 2 % IJ SOLN
INTRAMUSCULAR | Status: AC
  Filled 2022-10-17: qty 15

## 2022-10-17 MED ORDER — BUPIVACAINE HCL (PF) 0.5 % IJ SOLN
INTRAMUSCULAR | Status: DC | PRN
  Administered 2022-10-17: 10 mL via PERINEURAL

## 2022-10-17 MED ORDER — OXYCODONE-ACETAMINOPHEN 5-325 MG PO TABS: 1.0000 | ORAL_TABLET | ORAL | 0 refills | Status: DC | PRN

## 2022-10-17 MED ORDER — SODIUM CHLORIDE 0.9 % IR SOLN
Status: DC | PRN
  Administered 2022-10-17 (×2): 3000 mL
  Administered 2022-10-17: 6000 mL
  Administered 2022-10-17: 3000 mL

## 2022-10-17 MED ORDER — PROPOFOL 10 MG/ML IV BOLUS
INTRAVENOUS | Status: DC | PRN
  Administered 2022-10-17: 200 mg via INTRAVENOUS

## 2022-10-17 MED ORDER — ORAL CARE MOUTH RINSE: 15.0000 mL | Freq: Once | OROMUCOSAL | Status: AC

## 2022-10-17 MED ORDER — 0.9 % SODIUM CHLORIDE (POUR BTL) OPTIME
TOPICAL | Status: DC | PRN
  Administered 2022-10-17: 1000 mL

## 2022-10-17 MED ORDER — FENTANYL CITRATE PF 50 MCG/ML IJ SOSY: 25.0000 ug | PREFILLED_SYRINGE | INTRAMUSCULAR | Status: DC | PRN

## 2022-10-17 MED ORDER — ONDANSETRON HCL 4 MG/2ML IJ SOLN
INTRAMUSCULAR | Status: AC
  Filled 2022-10-17: qty 2

## 2022-10-17 MED ORDER — ACETAMINOPHEN 500 MG PO TABS
1000.0000 mg | ORAL_TABLET | Freq: Once | ORAL | Status: AC
  Administered 2022-10-17: 1000 mg via ORAL
  Filled 2022-10-17: qty 2

## 2022-10-17 MED ORDER — SUGAMMADEX SODIUM 500 MG/5ML IV SOLN
INTRAVENOUS | Status: AC
  Filled 2022-10-17: qty 5

## 2022-10-17 MED ORDER — CHLORHEXIDINE GLUCONATE 0.12 % MT SOLN
15.0000 mL | Freq: Once | OROMUCOSAL | Status: AC
  Administered 2022-10-17: 15 mL via OROMUCOSAL

## 2022-10-17 MED ORDER — DEXAMETHASONE SODIUM PHOSPHATE 10 MG/ML IJ SOLN
INTRAMUSCULAR | Status: AC
  Filled 2022-10-17: qty 1

## 2022-10-17 MED ORDER — SCOPOLAMINE 1 MG/3DAYS TD PT72
1.0000 | MEDICATED_PATCH | TRANSDERMAL | Status: DC
  Administered 2022-10-17: 1.5 mg via TRANSDERMAL
  Filled 2022-10-17: qty 1

## 2022-10-17 MED ORDER — OXYCODONE HCL 5 MG PO TABS
5.0000 mg | ORAL_TABLET | Freq: Once | ORAL | Status: AC | PRN
  Administered 2022-10-17: 5 mg via ORAL

## 2022-10-17 MED ORDER — BUPIVACAINE LIPOSOME 1.3 % IJ SUSP
INTRAMUSCULAR | Status: DC | PRN
  Administered 2022-10-17: 10 mL via PERINEURAL

## 2022-10-17 MED ORDER — ALBUTEROL SULFATE HFA 108 (90 BASE) MCG/ACT IN AERS
INHALATION_SPRAY | RESPIRATORY_TRACT | Status: AC
  Filled 2022-10-17: qty 6.7

## 2022-10-17 MED ORDER — SUGAMMADEX SODIUM 500 MG/5ML IV SOLN
INTRAVENOUS | Status: DC | PRN
  Administered 2022-10-17: 500 mg via INTRAVENOUS

## 2022-10-17 MED ORDER — ROCURONIUM BROMIDE 10 MG/ML (PF) SYRINGE
PREFILLED_SYRINGE | INTRAVENOUS | Status: AC
  Filled 2022-10-17: qty 30

## 2022-10-17 MED ORDER — CEFAZOLIN IN SODIUM CHLORIDE 3-0.9 GM/100ML-% IV SOLN
3.0000 g | INTRAVENOUS | Status: AC
  Administered 2022-10-17: 3 g via INTRAVENOUS

## 2022-10-17 MED ORDER — FENTANYL CITRATE PF 50 MCG/ML IJ SOSY
PREFILLED_SYRINGE | INTRAMUSCULAR | Status: AC
  Filled 2022-10-17: qty 2

## 2022-10-17 MED ORDER — PROPOFOL 10 MG/ML IV BOLUS
INTRAVENOUS | Status: AC
  Filled 2022-10-17: qty 20

## 2022-10-17 MED ORDER — MELOXICAM 15 MG PO TABS: ORAL_TABLET | ORAL | 0 refills | Status: DC

## 2022-10-17 MED ORDER — OXYCODONE HCL 5 MG/5ML PO SOLN: 5.0000 mg | Freq: Once | ORAL | Status: AC | PRN

## 2022-10-17 MED ORDER — AMISULPRIDE (ANTIEMETIC) 5 MG/2ML IV SOLN: 10.0000 mg | Freq: Once | INTRAVENOUS | Status: DC | PRN

## 2022-10-17 MED ORDER — OXYCODONE HCL 5 MG PO TABS
ORAL_TABLET | ORAL | Status: AC
  Filled 2022-10-17: qty 1

## 2022-10-17 MED ORDER — PHENYLEPHRINE HCL-NACL 20-0.9 MG/250ML-% IV SOLN
INTRAVENOUS | Status: DC | PRN
  Administered 2022-10-17: 30 ug/min via INTRAVENOUS

## 2022-10-17 MED ORDER — ROCURONIUM BROMIDE 100 MG/10ML IV SOLN
INTRAVENOUS | Status: DC | PRN
  Administered 2022-10-17: 60 mg via INTRAVENOUS

## 2022-10-17 MED ORDER — ONDANSETRON HCL 4 MG/2ML IJ SOLN
INTRAMUSCULAR | Status: DC | PRN
  Administered 2022-10-17: 4 mg via INTRAVENOUS

## 2022-10-17 MED ORDER — CYCLOBENZAPRINE HCL 10 MG PO TABS: 10.0000 mg | ORAL_TABLET | Freq: Three times a day (TID) | ORAL | 1 refills | Status: AC | PRN

## 2022-10-17 MED ORDER — ONDANSETRON HCL 4 MG PO TABS: 4.0000 mg | ORAL_TABLET | Freq: Three times a day (TID) | ORAL | 0 refills | Status: DC | PRN

## 2022-10-17 MED ORDER — DEXAMETHASONE SODIUM PHOSPHATE 10 MG/ML IJ SOLN
INTRAMUSCULAR | Status: DC | PRN
  Administered 2022-10-17: 10 mg via INTRAVENOUS

## 2022-10-17 MED ORDER — MIDAZOLAM HCL 2 MG/2ML IJ SOLN
INTRAMUSCULAR | Status: AC
  Administered 2022-10-17: 2 mg
  Filled 2022-10-17: qty 2

## 2022-10-17 MED ORDER — CEFAZOLIN SODIUM 1 G IJ SOLR
INTRAMUSCULAR | Status: AC
  Filled 2022-10-17: qty 20

## 2022-10-17 MED ORDER — LIDOCAINE HCL (CARDIAC) PF 100 MG/5ML IV SOSY
PREFILLED_SYRINGE | INTRAVENOUS | Status: DC | PRN
  Administered 2022-10-17: 100 mg via INTRAVENOUS

## 2022-10-17 MED ORDER — LACTATED RINGERS IV SOLN: INTRAVENOUS | Status: DC

## 2022-10-17 SURGICAL SUPPLY — 61 items
ANCH SUT SWLK 19.1X4.75 VT (Anchor) ×3 IMPLANT
ANCHOR PEEK 4.75X19.1 SWLK C (Anchor) IMPLANT
BAG COUNTER SPONGE SURGICOUNT (BAG) ×2 IMPLANT
BAG SPNG CNTER NS LX DISP (BAG) ×1
BLADE EXCALIBUR 4.0X13 (MISCELLANEOUS) ×2 IMPLANT
BOOTIES KNEE HIGH SLOAN (MISCELLANEOUS) ×4 IMPLANT
BURR OVAL 8 FLU 4.0X13 (MISCELLANEOUS) ×2 IMPLANT
CANNULA ACUFLEX KIT 5X76 (CANNULA) ×2 IMPLANT
CANNULA DRILOCK 5.0X75 (CANNULA) IMPLANT
CANNULA TWIST IN 8.25X7CM (CANNULA) IMPLANT
CONNECTOR 5 IN 1 STRAIGHT STRL (MISCELLANEOUS) ×2 IMPLANT
COOLER ICEMAN CLASSIC (MISCELLANEOUS) IMPLANT
DISSECTOR  3.8MM X 13CM (MISCELLANEOUS) ×1
DISSECTOR 3.5MM X 13CM (MISCELLANEOUS) IMPLANT
DISSECTOR 3.8MM X 13CM (MISCELLANEOUS) ×2 IMPLANT
DRAPE INCISE 23X17 STRL (DRAPES) ×2 IMPLANT
DRAPE INCISE IOBAN 23X17 STRL (DRAPES) ×1 IMPLANT
DRAPE INCISE IOBAN 66X45 STRL (DRAPES) ×2 IMPLANT
DRAPE ORTHO SPLIT 77X108 STRL (DRAPES)
DRAPE STERI 35X30 U-POUCH (DRAPES) ×2 IMPLANT
DRAPE SURG 17X11 SM STRL (DRAPES) ×2 IMPLANT
DRAPE SURG ORHT 6 SPLT 77X108 (DRAPES) IMPLANT
DRAPE U-SHAPE 47X51 STRL (DRAPES) ×2 IMPLANT
DURAPREP 26ML APPLICATOR (WOUND CARE) ×2 IMPLANT
GAUZE PAD ABD 7.5X8 STRL (GAUZE/BANDAGES/DRESSINGS) IMPLANT
GAUZE PAD ABD 8X10 STRL (GAUZE/BANDAGES/DRESSINGS) ×4 IMPLANT
GAUZE SPONGE 4X4 12PLY STRL (GAUZE/BANDAGES/DRESSINGS) ×2 IMPLANT
GLOVE BIO SURGEON STRL SZ7.5 (GLOVE) ×2 IMPLANT
GLOVE BIO SURGEON STRL SZ8 (GLOVE) ×2 IMPLANT
GLOVE SS BIOGEL STRL SZ 7 (GLOVE) ×6 IMPLANT
GLOVE SS BIOGEL STRL SZ 7.5 (GLOVE) ×2 IMPLANT
GOWN STRL SURGICAL XL XLNG (GOWN DISPOSABLE) ×4 IMPLANT
KIT BASIN OR (CUSTOM PROCEDURE TRAY) ×2 IMPLANT
KIT SHOULDER TRACTION (DRAPES) ×2 IMPLANT
KIT TURNOVER KIT A (KITS) IMPLANT
MANIFOLD NEPTUNE II (INSTRUMENTS) ×2 IMPLANT
NDL HD SCORPION MEGA LOADER (NEEDLE) IMPLANT
NDL SCORPION MULTI FIRE (NEEDLE) IMPLANT
NEEDLE SCORPION MULTI FIRE (NEEDLE) IMPLANT
NS IRRIG 1000ML POUR BTL (IV SOLUTION) ×2 IMPLANT
PACK ARTHROSCOPY WL (CUSTOM PROCEDURE TRAY) ×2 IMPLANT
PAD ARMBOARD 7.5X6 YLW CONV (MISCELLANEOUS) ×2 IMPLANT
PAD COLD SHLDR WRAP-ON (PAD) IMPLANT
PROBE APOLLO 90XL (SURGICAL WAND) ×1
SLING ARM FOAM STRAP MED (SOFTGOODS) IMPLANT
SLING ULTRA II L (ORTHOPEDIC SUPPLIES) IMPLANT
SPONGE T-LAP 4X18 ~~LOC~~+RFID (SPONGE) ×2 IMPLANT
STRIP CLOSURE SKIN 1/2X4 (GAUZE/BANDAGES/DRESSINGS) ×2 IMPLANT
SUT FIBERWIRE #2 38 T-5 BLUE (SUTURE) ×2
SUT MNCRL AB 3-0 PS2 18 (SUTURE) ×2 IMPLANT
SUT PDS AB 0 CT 36 (SUTURE) IMPLANT
SUT TIGER TAPE 7 IN WHITE (SUTURE) ×2 IMPLANT
SUTURE FIBERWR #2 38 T-5 BLUE (SUTURE) IMPLANT
TAPE FIBER 2MM 7IN #2 BLUE (SUTURE) IMPLANT
TAPE PAPER 3X10 WHT MICROPORE (GAUZE/BANDAGES/DRESSINGS) ×2 IMPLANT
TAPE STRIPS DRAPE STRL (GAUZE/BANDAGES/DRESSINGS) IMPLANT
TOWEL OR 17X26 10 PK STRL BLUE (TOWEL DISPOSABLE) ×2 IMPLANT
TUBING ARTHROSCOPY IRRIG 16FT (MISCELLANEOUS) ×2 IMPLANT
WAND ABLATOR APOLLO I90 (BUR) IMPLANT
WATER STERILE IRR 1000ML POUR (IV SOLUTION) ×2 IMPLANT
YANKAUER SUCT BULB TIP 10FT TU (MISCELLANEOUS) ×2 IMPLANT

## 2022-10-17 NOTE — Anesthesia Preprocedure Evaluation (Addendum)
Anesthesia Evaluation  Patient identified by MRN, date of birth, ID band Patient awake    Reviewed: Allergy & Precautions, NPO status , Patient's Chart, lab work & pertinent test results  History of Anesthesia Complications (+) PONV and history of anesthetic complications  Airway Mallampati: III  TM Distance: >3 FB Neck ROM: Full    Dental  (+) Dental Advisory Given   Pulmonary neg shortness of breath, sleep apnea , neg COPD, Recent URI  (COVID 08/2022), Resolved, former smoker   Pulmonary exam normal breath sounds clear to auscultation       Cardiovascular hypertension (lisinopril, furosemide, spironolactone), Pt. on medications (-) angina + CAD  (-) Past MI, (-) Cardiac Stents and (-) CABG (-) dysrhythmias  Rhythm:Regular Rate:Normal  Coronary CT 03/29/2021: IMPRESSION: 1. Coronary calcium score of 263. This was 99th percentile for age-, race-, and sex-matched controls.   2. Normal coronary origin with right dominance.   3.  Minimal plaque in the LAD and RCA.  CAD-RADS 1.   4. Recommend aggressive risk factor modification including LDL goal <70.  TTE 03/28/2021: IMPRESSIONS     1. Left ventricular ejection fraction, by estimation, is 60 to 65%. The  left ventricle has normal function. The left ventricle has no regional  wall motion abnormalities. Left ventricular diastolic parameters were  normal.   2. Right ventricular systolic function is normal. The right ventricular  size is normal.   3. The mitral valve is normal in structure. Trivial mitral valve  regurgitation. No evidence of mitral stenosis.   4. The aortic valve is grossly normal. Aortic valve regurgitation is not  visualized. No aortic stenosis is present.      Neuro/Psych  Headaches, neg Seizures PSYCHIATRIC DISORDERS (borderline personality disorder, PTSD) Anxiety Depression Bipolar Disorder    Neuromuscular disease (trigeminal neuralgia)     GI/Hepatic ,GERD  ,,(+) Cirrhosis  (due to NASH)      divertculosis   Endo/Other  diabetes (Hgb A1c 6.5), Well Controlled, Type 2, Insulin DependentHypothyroidism  Morbid obesity  Renal/GU negative Renal ROS     Musculoskeletal  (+) Arthritis , Osteoarthritis,    Abdominal  (+) + obese  Peds  Hematology negative hematology ROS (+)   Anesthesia Other Findings   Reproductive/Obstetrics                             Anesthesia Physical Anesthesia Plan  ASA: 3  Anesthesia Plan: General and Regional   Post-op Pain Management: Tylenol PO (pre-op)* and Regional block*   Induction: Intravenous  PONV Risk Score and Plan: 4 or greater and Ondansetron, Dexamethasone, Treatment may vary due to age or medical condition, Scopolamine patch - Pre-op and Midazolam  Airway Management Planned: Oral ETT and Video Laryngoscope Planned  Additional Equipment:   Intra-op Plan:   Post-operative Plan: Extubation in OR  Informed Consent: I have reviewed the patients History and Physical, chart, labs and discussed the procedure including the risks, benefits and alternatives for the proposed anesthesia with the patient or authorized representative who has indicated his/her understanding and acceptance.     Dental advisory given  Plan Discussed with: CRNA and Anesthesiologist  Anesthesia Plan Comments: (Discussed potential risks of nerve blocks including, but not limited to, infection, bleeding, nerve damage, seizures, pneumothorax, respiratory depression, and potential failure of the block. Alternatives to nerve blocks discussed. All questions answered.  Risks of general anesthesia discussed including, but not limited to, sore throat, hoarse voice, chipped/damaged  teeth, injury to vocal cords, nausea and vomiting, allergic reactions, lung infection, heart attack, stroke, and death. All questions answered. )        Anesthesia Quick Evaluation

## 2022-10-17 NOTE — Op Note (Signed)
10/17/2022  4:40 PM  PATIENT:   Tracy [name]  57 y.o. female  PRE-OPERATIVE DIAGNOSIS:  Right shoulder chronic impingement, acromioclavicular osteoarthritis, rotator cuff tear  POST-OPERATIVE DIAGNOSIS: Same with additional findings of extensive degenerative labral tearing, synovitis, and severe biceps tendinopathy  PROCEDURE:  1.  Right shoulder examination under anesthesia  2.  Right shoulder glenohumeral joint diagnostic arthroscopy  3.  Debridement of extensive degenerative labral tear  4.  Arthroscopic biceps tendon tenotomy  5.  Arthroscopic subacromial decompression and bursectomy  6.  Arthroscopic distal clavicle resection  7.  Arthroscopic rotator cuff repair using a double row suture bridge repair construct.  SURGEON:  Marin Shutter M.D.  ASSISTANTS: Jenetta Loges, PA-C  Jenetta Loges, PA-C was utilized as an Environmental consultant throughout this case, essential for help with positioning the patient, positioning extremity, tissue manipulation, implantation of the prosthesis, suture management, wound closure, and intraoperative decision-making.  ANESTHESIA:   General endotracheal and interscalene block with Exparel  EBL: Minimal  SPECIMEN: None  Drains: None   PATIENT DISPOSITION:  PACU - hemodynamically stable.    PLAN OF CARE: Discharge to home after PACU  Brief history:  Patient is a 57 year old female who has had chronic right shoulder pain after a fall with examination showing severely positive impingement sign and MRI scan confirming a full-thickness rotator cuff tear as well as severe AC joint arthritis and marked bony impingement with a type III acromial morphology.  Due to her ongoing pain and functional limitations and failure to respond to prolonged attempts at conservative management, she is brought to the operating this time for planned right shoulder arthroscopy as described below.  Preoperatively the patient was counseled regarding treatment options  as well as potential risks versus benefits there.  Possible surgical complications were reviewed including the potential for bleeding, infection, neurovascular injury, persistence of pain, loss of motion, anesthetic complication, recurrence of rotator cuff tear, and possible need for additional surgery.  She understands, and accepts, and agrees with the planned procedure.  Procedure detail:  After undergoing routine preop evaluation the patient received prophylactic antibiotics and interscalene block with Exparel was established in the holding area by the anesthesia department.  Subsequently brought to the operating placed spine on the operating table and underwent the smooth induction of a general endotracheal anesthesia.  Turned to the left lateral decubitus position on the beanbag and appropriately padded and protected.  Right shoulder examination under anesthesia revealed full motion with no instability patterns noted.  The right arm was then suspended at 70 degrees of abduction with 15 pounds of traction and the right shoulder girdle region was sterilely prepped and draped in standard fashion.  Timeout was called.  A posterior portal was established into the glenohumeral joint and an anterior portal established under direct visualization.  The articular surfaces did show some blistering and superficial fibrillations.  There is diffuse synovitis and a limited synovectomy was performed.  There was extensive degenerative labral tearing primarily anteriorly and superiorly which was debrided with a shaver back to stable margin.  The bicep tendon shows severe fraying distally represented more than 50% of the tendon thickness evidence of the long head biceps tendon was tenotomized.  The rotator cuff showed an obvious full-thickness tear of the distal supraspinatus with some calcifications of the damaged tissue.  Remaining inspection of the glenohumeral joint showed no obvious additional pathologies.  Fluid and  incisions were then removed.  The arm was dropped down to 30 degrees of abduction with arthroscope  introduced in the subacromial space of the posterior portal and a direct lateral portal established in the subacromial space.  Abundant dense bursal tissue and multiple adhesions were encountered and these were all divided and excised with a combination of the shaver and the Stryker wand.  The wand was then used to remove the periosteum from the undersurface of the anterior half of the acromion we did identify a severely hooked type III acromial morphology where the acromion had abraded the rotator cuff and was localized directly over the area of the full-thickness rotator cuff tear.  We performed a subacromial decompression with a bur crating a type I morphology nicely decompressing the rotator cuff and subacromial space.  A portal was then established directly anterior to the distal clavicle and the distal clavicle resection was performed with a bur.  Care was taken to confirm visualization of the entire circumference of the distal clavicle to ensure adequate removal of bone.  We then completed the subacromial/subdeltoid bursectomy.  The rotator cuff tear was identified and a shaver was used to trim this back to a margin of healthy appearing tissue removing the calcified areas.  Footprint was approximately 2 cm in width.  The greater tuberosity was then debrided of soft tissue and bur was used to abrade the bone to bleeding bed.  Through a stab wound off the lateral margin of the acromion was placed a single 4.75 peek swivel lock suture anchor loaded with 2 fiber tapes.  Once seated the 6 suture limbs were then shuttled equidistant across the width of the rotator cuff tear using the scorpion suture passer.  The suture limbs were then passed in an alternating fashion into 2 lateral row anchors allowing [place] to create a double row repair with excellent apposition of the margin of the rotator cuff over the footprint on  the tuberosity.  Suture limbs were all then clipped.  Bursectomy was completed.  Final hemostasis was obtained.  Fluid and instruments were removed.  The portals were closed with a Monocryl and Steri-Strip.  A dry dressing was taped about the right shoulder right arm was placed into a sling immobilizer and the patient was awakened, extubated, and taken to the recovery room in stable condition.  Marin Shutter MD   Contact # [phone]

## 2022-10-17 NOTE — Anesthesia Postprocedure Evaluation (Signed)
Anesthesia Post Note  Patient: MERRILLYN LITWILLER  Procedure(s) Performed: Right shoulder arthroscopy, debridement, subacromial decompression, distal clavicle resection, rotator cuff repair (Right)     Patient location during evaluation: PACU Anesthesia Type: Regional and General Level of consciousness: awake Pain management: pain level controlled Vital Signs Assessment: post-procedure vital signs reviewed and stable Respiratory status: spontaneous breathing, nonlabored ventilation and respiratory function stable Cardiovascular status: blood pressure returned to baseline and stable Postop Assessment: no apparent nausea or vomiting Anesthetic complications: no   No notable events documented.  Last Vitals:  Vitals:   10/17/22 1730 10/17/22 1740  BP: 106/64   Pulse: 70 68  Resp: 17   Temp:    SpO2: 94% 91%    Last Pain:  Vitals:   10/17/22 1730  TempSrc:   PainSc: 0-No pain                 Nilda Simmer

## 2022-10-17 NOTE — Anesthesia Procedure Notes (Signed)
Procedure Name: Intubation Date/Time: 10/17/2022 3:13 PM  Performed by: Jonna Munro, CRNAPre-anesthesia Checklist: Patient identified, Emergency Drugs available, Suction available, Patient being monitored and Timeout performed Patient Re-evaluated:Patient Re-evaluated prior to induction Oxygen Delivery Method: Circle system utilized Preoxygenation: Pre-oxygenation with 100% oxygen Induction Type: IV induction Ventilation: Mask ventilation without difficulty Laryngoscope Size: Glidescope and 4 Grade View: Grade I Tube type: Oral Tube size: 7.0 mm Number of attempts: 1 Airway Equipment and Method: Stylet and Video-laryngoscopy Placement Confirmation: ETT inserted through vocal cords under direct vision, positive ETCO2, CO2 detector and breath sounds checked- equal and bilateral Secured at: 22 cm Tube secured with: Tape Dental Injury: Teeth and Oropharynx as per pre-operative assessment

## 2022-10-17 NOTE — Discharge Instructions (Signed)
   Kevin M. Supple, M.D., F.A.A.O.S. Orthopaedic Surgery Specializing in Arthroscopic and Reconstructive Surgery of the Shoulder [phone] [address] - [place], [place] [address] - Fax [phone]  POST-OP SHOULDER ARTHROSCOPIC ROTATOR CUFF  REPAIR INSTRUCTIONS  1. Call the office at [phone] to schedule your first post-op appointment 7-10 days from the date of your surgery.  2. Leave the steri-strips in place over your incisions when performing dressing changes and showering. You may remove your dressings and begin showering 72 hours from surgery. You can expect drainage that is clear to bloody in nature that occasionally will soak through your dressings. If this occurs go ahead and perform a dressing change. The drainage should lessen daily and when there is no drainage from your incisions feel free to go without a dressing.  3. Wear your sling/immobilizer at all times except to perform the exercises below or to occasionally let your arm dangle by your side to stretch your elbow. You also need to sleep in your sling immobilizer until instructed otherwise.  4. Range of motion to your elbow, wrist, and hand are encouraged 3-5 times daily. Exercise to your hand and fingers helps to reduce swelling you may experience.  5. Utilize ice to the shoulder 3-4 times minimum a day and additionally if you are experiencing pain.  6. You may one-armed drive when safely off of narcotics and muscle relaxants. You may use your hand that is in the sling to support the steering wheel only. However, should it be your right arm that is in the sling it is not to be used for gear shifting in a manual transmission.  7. Pain control following an exparel block  To help control your post-operative pain you received a nerve block  performed with Exparel which is a long acting anesthetic (numbing agent) which can provide pain relief and sensations of numbness (and relief of pain) in the operative  shoulder and arm for up to 3 days. Sometimes it provides mixed relief, meaning you may still have numbness in certain areas of the arm but can still be able to move  parts of that arm, hand, and fingers. We recommend that your prescribed pain medications  be used as needed. We do not feel it is necessary to "pre medicate" and "stay ahead" of pain.  Taking narcotic pain medications when you are not having any pain can lead to unnecessary and potentially dangerous side effects.    8. Pain medications can produce constipation along with their use. If you experience this, the use of an over the counter stool softener or laxative daily is recommended.   9. For additional questions or concerns, please do not hesitate to call the office. If after hours there is an answering service to forward your concerns to the physician on call.  POST-OP EXERCISES  Pendulum Exercises  Perform pendulum exercises while standing and bending at the waist. Support your uninvolved arm on a table or chair and allow your operated arm to hang freely. Make sure to do these exercises passively - not using you shoulder muscle.  Repeat 20 times. Do 3 sessions per day.   

## 2022-10-17 NOTE — Transfer of Care (Signed)
Immediate Anesthesia Transfer of Care Note  Patient: Tracy [name]  Procedure(s) Performed: Right shoulder arthroscopy, debridement, subacromial decompression, distal clavicle resection, rotator cuff repair (Right)  Patient Location: PACU  Anesthesia Type:General  Level of Consciousness: awake and alert   Airway & Oxygen Therapy: Patient Spontanous Breathing and Patient connected to face mask oxygen  Post-op Assessment: Report given to RN, Post -op Vital signs reviewed and stable, and Patient moving all extremities X 4  Post vital signs: Reviewed and stable  Last Vitals:  Vitals Value Taken Time  BP 103/80 10/17/22 1700  Temp    Pulse 78 10/17/22 1702  Resp 19 10/17/22 1702  SpO2 94 % 10/17/22 1702  Vitals shown include unvalidated device data.  Last Pain:  Vitals:   10/17/22 1335  TempSrc:   PainSc: (P) 0-No pain      Patients Stated Pain Goal: 4 ([id number])  Complications: No notable events documented.

## 2022-10-17 NOTE — H&P (Signed)
Tracy [name]    Chief Complaint: Right shoulder chronic impingement, acromioclavicular osteoarthritis, rotator cuff tear HPI: The patient is a 57 y.o. female with chronic right shoulder pain after a fall with clinical exam demonstrating a markedly positive impingement sign and severe pain with overhead elevation.  Plain radiographs confirm severe AC joint arthritis as well as a type III acromial morphology.  Recent MRI scan confirms full-thickness rotator cuff tear.  She is brought to the operating room this time for planned right shoulder arthroscopy with anticipated rotator cuff repair, subacromial decompression, and distal clavicle resection.  Past Medical History:  Diagnosis Date   Acanthosis nigricans, acquired 02/09/2015   Anxiety and depression 10/31/2014   Arthritis    back, knees, right elbow   Arthritis of left hip 06/25/2018   Bilateral swelling of feet and ankles    Bipolar disorder (HCC)    Borderline personality disorder ([place])    Cancer (HCC)    oral lichen planus, uses mouth wash, cream   Carpal tunnel syndrome 09/06/2015   Chewing difficulty    Chronic left shoulder pain 05/17/2020   Constipation    Coronary artery disease 03/21/2021   COVID 10/08/2021   Cystic teratoma    BENIGN   Dental crowns present    Diabetes mellitus without complication ([place])    Diabetes mellitus, type 2 ([place]) 06/10/2013   Diarrhea    Diverticulosis of colon without hemorrhage    DOE (dyspnea on exertion) 03/21/2021   Dyspepsia 12/14/2020   Essential hypertension 10/02/2007   Qualifier: Diagnosis of  By: Loanne Drilling MD, Sean A    Facial rash 06/16/2019   Female hirsutism 02/09/2015   Food allergy    Walnuts   GAD (generalized anxiety disorder)    Gastritis and gastroduodenitis    Gastroesophageal reflux disease    Grade II internal hemorrhoids    Greater trochanteric bursitis of left hip 07/24/2018   Hemorrhoids, internal 07/18/2017   History of kidney stones    History of migraine  headaches    History of posttraumatic stress disorder (PTSD)    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/2015   Hypothyroidism    HYPOTHYROIDISM, POSTSURGICAL 10/02/2007   Qualifier: Diagnosis of  By: Loanne Drilling MD, Sean A    IBS (irritable bowel syndrome)    Infertility associated with anovulation 02/09/2015   Insulin resistance 02/09/2015   Joint pain    Leg swelling [id number]   Lichen planus    Liver cirrhosis secondary to NASH (nonalcoholic steatohepatitis) ([place])    Lower back pain 07/18/2017   MDD (major depressive disorder) 06/06/2015   Migraine 10/02/2007   Morbid obesity with BMI of 40.0-44.9, adult ([place]) 06/23/2020   NASH (nonalcoholic steatohepatitis)    Nausea without vomiting    Nonalcoholic fatty liver disease 02/09/2015   Obstructive sleep apnea 06/27/2014   Oral mucosal lesion 06/17/2019   Osteoarthritis    Other fatigue    PCOS (polycystic ovarian syndrome)    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[place]) 01/01/2012   POLYCYSTIC OVARIAN DISEASE 10/02/2007   Qualifier: Diagnosis of  By: Loanne Drilling MD, Hilliard Clark A    PONV (postoperative nausea and vomiting)    also hx. 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   Qualifier: Diagnosis of  By: Loanne Drilling MD, Hilliard Clark A    Trigeminal neuralgia    Unilateral primary osteoarthritis, left hip 02/10/2019   Vitamin D deficiency       Past Surgical History:  Procedure Laterality Date   ACHILLES TENDON SURGERY Left 2007   APPENDECTOMY     CARPAL TUNNEL RELEASE Right 01/12/2016   Procedure: RIGHT CARPAL TUNNEL RELEASE;  Surgeon: Roseanne Kaufman, MD;   Location: [facility];  Service: Orthopedics;  Laterality: Right;   COLONOSCOPY WITH PROPOFOL N/A 04/20/2014   Procedure: COLONOSCOPY WITH PROPOFOL;  Surgeon: Arta Silence, MD;  Location: [facility];  Service: Endoscopy;  Laterality: N/A;   COLONOSCOPY WITH PROPOFOL N/A 01/10/2021   Procedure: COLONOSCOPY WITH PROPOFOL;  Surgeon: Lavena Bullion, DO;  Location: [facility];  Service: Gastroenterology;  Laterality: N/A;   ESOPHAGOGASTRODUODENOSCOPY (EGD) WITH PROPOFOL N/A 04/20/2014   Procedure: ESOPHAGOGASTRODUODENOSCOPY (EGD) WITH PROPOFOL;  Surgeon: Arta Silence, MD;  [facility];  Service: Endoscopy;  Laterality: N/A;   ESOPHAGOGASTRODUODENOSCOPY (EGD) WITH PROPOFOL N/A 01/10/2021   Procedure: ESOPHAGOGASTRODUODENOSCOPY (EGD) WITH PROPOFOL;  Surgeon: Lavena Bullion, DO;  [facility];  Service: Gastroenterology;  Laterality: N/A;   EXCISION HAGLUND'S DEFORMITY WITH ACHILLES TENDON REPAIR Right 02/25/2013   Procedure: RIGHT ACHILLES DEBRIDEMENT AND RECONSTRUCTION;  HAGLUND'S EXCISION; GASTROC RECESSION AND FLEXOR HALLUCIS LONGUS TRANSFER;  Surgeon: Wylene Simmer, MD;  Location: [facility];  Service: Orthopedics;  Laterality: Right;   JOINT REPLACEMENT     hip   LUMBAR LAMINECTOMY     X 3   neck fusion     OVARIAN CYST SURGERY Left 1998   PILONIDAL CYST EXCISION  1994   RIGHT OOPHORECTOMY     STERIOD INJECTION Left 01/12/2016   Procedure: STEROID INJECTION LEFT WRIST;  Surgeon: Roseanne Kaufman, MD;  Location: [facility];  Service: Orthopedics;  Laterality: Left;   TOTAL HIP ARTHROPLASTY Left 03/19/2019   Procedure: LEFT TOTAL HIP ARTHROPLASTY ANTERIOR APPROACH;  Surgeon: Mcarthur Rossetti, MD;  Location: [facility];  Service: Orthopedics;  Laterality: Left;   TOTAL THYROIDECTOMY  2003   UPPER GI ENDOSCOPY  01/19/2015    Family History  Problem Relation Age of Onset   Heart disease Father    Depression Father    Heart  attack Father    Sudden death Father    Anxiety disorder Father    Bipolar disorder Father    Obesity Father    Cancer Brother        THROAT cancer   Depression Brother    Anxiety disorder Brother    Depression Sister    Breast cancer Mother    Hypertension Mother    Cancer Mother    Depression Mother    Sleep apnea Mother    Obesity Mother    Depression Maternal Grandmother    Stroke Maternal Grandfather    Colon cancer Brother    Pancreatic cancer Neg Hx    Stomach cancer Neg Hx    Liver disease Neg Hx     Social History:  reports that she quit smoking about 3 years ago. Her smoking use included cigarettes. She has a 7.50 pack-year smoking history. She has never used smokeless tobacco. She reports that she does not drink alcohol and does not use drugs.  BMI: Estimated body mass index is 44.19 kg/m as  calculated from the following:   Height as of this encounter: '5\' 10"'$  (1.778 m).   Weight as of this encounter: 139.7 kg.  Lab Results  Component Value Date   ALBUMIN 3.7 10/11/2022   Diabetes:   Patient has a diagnosis of diabetes,  Lab Results  Component Value Date   HGBA1C 6.5 (H) 10/11/2022   Smoking Status:       Medications Prior to Admission  Medication Sig Dispense Refill   acetaminophen (TYLENOL) 500 MG tablet Take 1,000 mg by mouth 2 (two) times daily as needed for moderate pain.     Armodafinil 200 MG TABS Take 1 Tablet by mouth daily as needed (sleepiness). 30 tablet 2   Blood Glucose Monitoring Suppl (FREESTYLE LITE) w/Device KIT Use as directed. 1 kit 0   Blood Glucose Monitoring Suppl (FREESTYLE LITE) w/Device KIT Check fasting blood sugar every morning and before meals up to 4 times daily. 1 kit PRN   butalbital-acetaminophen-caffeine (FIORICET) 50-325-40 MG tablet Take 1-2 tablets by mouth every 6 (six) hours as needed for headache. 20 tablet 0   clobetasol (TEMOVATE) 0.05 % GEL Apply small amount to affected area twice daily using finger. Gently  pat dry with cotton gauze.  Do not eat,drink, or speak for 30 minutes to allow absorption. 30 g 2   clonazePAM (KLONOPIN) 1 MG tablet Take 1 tablet (1 mg total) by mouth 3 (three) times daily. 90 tablet 2   Continuous Blood Gluc Sensor (DEXCOM G6 SENSOR) MISC Replace every 10 days.  Use to check blood sugar. 9 each 1   Continuous Blood Gluc Transmit (DEXCOM G6 TRANSMITTER) MISC Use to check continuous blood sugar.  Replace every 90 days 1 each 3   cyclobenzaprine (FLEXERIL) 10 MG tablet Take 1 tablet (10 mg total) by mouth 3 (three) times daily as needed for muscle spasms. 30 tablet 0   diclofenac Sodium (VOLTAREN) 1 % GEL Apply 1 Application topically 4 (four) times daily as needed (pain).     ferrous sulfate 325 (65 FE) MG EC tablet Take 1 tablet (325 mg total) by mouth in the morning and at bedtime. Take iron supplement 2 hours before or 4 hours after PPI or other antacids as this requires some level of gastric acidity to aid in absorption (Patient taking differently: Take 325 mg by mouth daily. Take iron supplement 2 hours before or 4 hours after PPI or other antacids as this requires some level of gastric acidity to aid in absorption) 60 tablet 5   fluticasone (FLONASE) 50 MCG/ACT nasal spray Place 2 sprays into both nostrils daily.     folic acid (FOLVITE) 1 MG tablet Take 1 tablet by mouth daily. 30 tablet 0   furosemide (LASIX) 40 MG tablet Take 1 tablet (40 mg total) by mouth daily. (Patient taking differently: Take 40 mg by mouth daily as needed for edema.) 30 tablet 3   glucose blood (FREESTYLE LITE) test strip Use as instructed 100 each 12   glucose blood (FREESTYLE LITE) test strip Check fasting blood sugar every morning and before meals up to 4 times a day as directed. 100 strip 12   hydrOXYzine (ATARAX) 25 MG tablet Take 1 tablet (25 mg total) by mouth 3 (three) times daily as needed for anxiety. 90 tablet 3   ibuprofen (ADVIL) 200 MG tablet Take 400 mg by mouth 2 (two) times daily as  needed for moderate pain.     insulin lispro (HUMALOG KWIKPEN) 100 UNIT/ML KwikPen Inject up to  16 units 3 times daily at mealtime per sliding scale. (Patient taking differently: Inject 3-16 Units into the skin 3 (three) times daily as needed (when taking steroids).) 15 mL 11   lamoTRIgine (LAMICTAL) 200 MG tablet Take 2 tablets by mouth at bedtime. 180 tablet 1   Lancets 30G MISC Check fasting blood sugar every morning and before meals up to 4 times daily. 100 each 12   lidocaine (XYLOCAINE) 5 % ointment Apply topically to affected areas 3 times daily as needed. 35.44 g 0   lisinopril (ZESTRIL) 10 MG tablet Take 1 tablet (10 mg total) by mouth daily. 90 tablet 3   meloxicam (MOBIC) 15 MG tablet Take 1 tablet by mouth once every morning with a meal for 2 weeks, then daily as needed for pain. (Patient taking differently: Take 15 mg by mouth daily as needed for pain.) 30 tablet 0   Menthol, Topical Analgesic, (BENGAY EX) Apply 1 Application topically daily as needed (pain).     naproxen (NAPROSYN) 500 MG tablet Take 1 tablet (500 mg total) by mouth 2 (two) times daily with a meal. (Patient taking differently: Take 500 mg by mouth 2 (two) times daily as needed for moderate pain.) 30 tablet 0   neomycin-bacitracin-polymyxin 3.5-956 [id number] OINT Apply topically 2 (two) times daily for 10 days To corners of mouth (Patient taking differently: Apply 1 Application topically 2 (two) times daily as needed (cracking/ raw skin around mouth).) 14 g 2   nystatin (MYCOSTATIN) 100000 UNIT/ML suspension Use 14m to swish in mouth and swallow --use 4 times daily for 10 days 180 mL 0   nystatin (NYAMYC) powder Apply 3 times daily as directed. (Patient taking differently: Apply 1 application  topically 3 (three) times daily as needed (rash).) 60 g 3   nystatin ointment (MYCOSTATIN) Apply to the skin 2 times daily to corners of mouth (Patient taking differently: Apply 1 Application topically 2 (two) times daily as needed  (raw/cracking skin around the mouth).) 15 g 2   ondansetron (ZOFRAN-ODT) 4 MG disintegrating tablet Dissolve 1 tablet (4 mg total) by mouth 2 (two) times daily as needed for nausea or vomiting. 20 tablet 1   pantoprazole (PROTONIX) 40 MG tablet Take 2 tablets (80 mg total) by mouth 2 (two) times daily. 60 tablet 3   pimecrolimus (ELIDEL) 1 % cream Apply a small amount to affected area 2 x daily. Gently pat area dry w/cotton gauze. No eating, drinking or speaking for 20 - 30 minutes to allow medicine to be absorbed. Can apply to 2x2 gauze & placed against the affected area. (Patient taking differently: Apply 1 Application topically 2 (two) times daily as needed (ulcers).) 60 g 3   risperiDONE (RISPERDAL) 1 MG tablet Take 1.5 tablets (1.5 mg total) by mouth at bedtime. 135 tablet 0   rosuvastatin (CRESTOR) 20 MG tablet TAKE 1 TABLET BY MOUTH DAILY. 90 tablet 3   sertraline (ZOLOFT) 100 MG tablet Take 2 tablets (200 mg total) by mouth every morning. 180 tablet 3   spironolactone (ALDACTONE) 50 MG tablet Take 1 tablet (50 mg total) by mouth 2 (two) times daily. 180 tablet 2   SYNTHROID 300 MCG tablet TAKE 1 TABLET BY MOUTH ONCE A DAY BEFORE BREAKFAST 90 tablet 1   tirzepatide (MOUNJARO) 12.5 MG/0.5ML Pen Inject 12.5 mg into the skin once a week. 6 mL 1   Vitamin D, Ergocalciferol, (DRISDOL) 1.25 MG (50000 UNIT) CAPS capsule TAKE 1 CAPSULE BY MOUTH EVERY 7 DAYS. 12 capsule 1  clotrimazole (MYCELEX) 10 MG troche Dissolve 1 tablet by mouth 2 times a day. Dissolve tablet slowly and completely on days you use topical steroid. (Patient not taking: Reported on 10/10/2022) 70 Troche 2   HYDROcodone-acetaminophen (NORCO) 10-325 MG tablet Take 1 tablet by mouth every 8 (eight) hours as needed. (Patient not taking: Reported on 10/10/2022) 15 tablet 0   Insulin Pen Needle (UNIFINE PENTIPS) 31G X 5 MM MISC Use as directed to inject insulin 100 each 3   nystatin (MYCOSTATIN) 100000 UNIT/ML suspension Use 45m to swish and  swallow 4  times daily for 10 days (Patient not taking: Reported on 10/10/2022) 200 mL 0     Physical Exam: Right shoulder demonstrates painful but functional motion.  She has severe pain with elevate elevation.  Markedly positive impingement sign.  Negative drop arm but global weakness to manual muscle testing.  Examination is otherwise as we noted at her recent office visits.  Radiographs  Plain films confirm advanced AC joint arthritis as well as type III acromial morphology.  Recent MRI scan confirms full-thickness rotator cuff tear.  Vitals  Temp:  [98.1 F (36.7 C)] 98.1 F (36.7 C) (03/14 1250) Pulse Rate:  [65-71] 68 (03/14 1335) Resp:  [15-21] 21 (03/14 1335) BP: (119-133)/(69-90) 120/69 (03/14 1335) SpO2:  [94 %-97 %] 94 % (03/14 1335) Weight:  [139.7 kg] 139.7 kg (03/14 1252)  Assessment/Plan  Impression: Right shoulder chronic impingement, acromioclavicular osteoarthritis, rotator cuff tear  Plan of Action: Procedure(s): Right shoulder arthroscopy, debridement, subacromial decompression, distal clavicle resection, rotator cuff repair  Shray Hunley M Antrell Tipler 10/17/2022, 2:15 PM Contact # [phone]

## 2022-10-17 NOTE — Anesthesia Procedure Notes (Addendum)
Anesthesia Regional Block: Interscalene brachial plexus block   Pre-Anesthetic Checklist: , timeout performed,  Correct Patient, Correct Site, Correct Laterality,  Correct Procedure, Correct Position, site marked,  Risks and benefits discussed,  Surgical consent,  Pre-op evaluation,  At surgeon's request and post-op pain management  Laterality: Right  Prep: chloraprep       Needles:  Injection technique: Single-shot  Needle Type: Echogenic Stimulator Needle     Needle Length: 9cm  Needle Gauge: 21     Additional Needles:   Procedures:,,,, ultrasound used (permanent image in chart),,    Narrative:  Start time: 10/17/2022 1:16 PM End time: 10/17/2022 1:18 PM Injection made incrementally with aspirations every 5 mL.  Performed by: Personally  Anesthesiologist: Nilda Simmer, MD  Additional Notes: Discussed risks and benefits of nerve block including, but not limited to, prolonged and/or permanent nerve injury involving sensory and/or motor function. Monitors were applied and a time-out was performed. The nerve and associated structures were visualized under ultrasound guidance. After negative aspiration, local anesthetic was slowly injected around the nerve. There was no evidence of high pressure during the procedure. There were no paresthesias. VSS remained stable and the patient tolerated the procedure well.

## 2022-10-18 ENCOUNTER — Other Ambulatory Visit: Payer: Self-pay | Admitting: Family Medicine

## 2022-10-18 ENCOUNTER — Other Ambulatory Visit (INDEPENDENT_AMBULATORY_CARE_PROVIDER_SITE_OTHER): Payer: Self-pay | Admitting: Family Medicine

## 2022-10-18 ENCOUNTER — Other Ambulatory Visit: Payer: Self-pay

## 2022-10-18 ENCOUNTER — Other Ambulatory Visit (HOSPITAL_COMMUNITY): Payer: Self-pay

## 2022-10-18 DIAGNOSIS — E538 Deficiency of other specified B group vitamins: Secondary | ICD-10-CM

## 2022-10-18 DIAGNOSIS — E559 Vitamin D deficiency, unspecified: Secondary | ICD-10-CM

## 2022-10-18 MED ORDER — FOLIC ACID 1 MG PO TABS
1.0000 mg | ORAL_TABLET | Freq: Every day | ORAL | 0 refills | Status: DC
  Filled 2022-10-18: qty 30, 30d supply, fill #0

## 2022-10-19 ENCOUNTER — Other Ambulatory Visit (HOSPITAL_COMMUNITY): Payer: Self-pay

## 2022-10-21 ENCOUNTER — Encounter (HOSPITAL_COMMUNITY): Payer: Self-pay | Admitting: Orthopedic Surgery

## 2022-10-21 ENCOUNTER — Other Ambulatory Visit: Payer: Self-pay

## 2022-10-21 DIAGNOSIS — Z9889 Other specified postprocedural states: Secondary | ICD-10-CM | POA: Insufficient documentation

## 2022-10-31 ENCOUNTER — Other Ambulatory Visit: Payer: Self-pay | Admitting: Family Medicine

## 2022-10-31 DIAGNOSIS — E559 Vitamin D deficiency, unspecified: Secondary | ICD-10-CM

## 2022-11-04 ENCOUNTER — Other Ambulatory Visit (HOSPITAL_COMMUNITY): Payer: Self-pay

## 2022-11-05 DIAGNOSIS — F3181 Bipolar II disorder: Secondary | ICD-10-CM | POA: Diagnosis not present

## 2022-11-06 ENCOUNTER — Other Ambulatory Visit (HOSPITAL_COMMUNITY): Payer: Self-pay

## 2022-11-06 ENCOUNTER — Other Ambulatory Visit: Payer: Self-pay | Admitting: Family Medicine

## 2022-11-06 ENCOUNTER — Other Ambulatory Visit: Payer: Self-pay

## 2022-11-06 DIAGNOSIS — M25611 Stiffness of right shoulder, not elsewhere classified: Secondary | ICD-10-CM | POA: Diagnosis not present

## 2022-11-06 DIAGNOSIS — M25511 Pain in right shoulder: Secondary | ICD-10-CM | POA: Diagnosis not present

## 2022-11-06 DIAGNOSIS — E559 Vitamin D deficiency, unspecified: Secondary | ICD-10-CM

## 2022-11-07 NOTE — Telephone Encounter (Signed)
Spoke with patient. She will call back to schedule an appointment. She stats that she had shoulder surgery 3 weeks ago - lives in [place] and is hard for her to drive at the moment. She will check her calendar and make an appointment soon.

## 2022-11-07 NOTE — Telephone Encounter (Signed)
Attempted call to patient. Left voice mail message requesting a return call.  

## 2022-11-14 DIAGNOSIS — F3181 Bipolar II disorder: Secondary | ICD-10-CM | POA: Diagnosis not present

## 2022-11-14 DIAGNOSIS — M25611 Stiffness of right shoulder, not elsewhere classified: Secondary | ICD-10-CM | POA: Diagnosis not present

## 2022-11-14 DIAGNOSIS — M25511 Pain in right shoulder: Secondary | ICD-10-CM | POA: Diagnosis not present

## 2022-11-18 DIAGNOSIS — M25611 Stiffness of right shoulder, not elsewhere classified: Secondary | ICD-10-CM | POA: Diagnosis not present

## 2022-11-18 DIAGNOSIS — M25511 Pain in right shoulder: Secondary | ICD-10-CM | POA: Diagnosis not present

## 2022-11-20 ENCOUNTER — Encounter: Payer: Self-pay | Admitting: Family Medicine

## 2022-11-20 ENCOUNTER — Other Ambulatory Visit: Payer: Self-pay

## 2022-11-20 DIAGNOSIS — N950 Postmenopausal bleeding: Secondary | ICD-10-CM

## 2022-11-20 DIAGNOSIS — M25511 Pain in right shoulder: Secondary | ICD-10-CM | POA: Diagnosis not present

## 2022-11-20 DIAGNOSIS — M25611 Stiffness of right shoulder, not elsewhere classified: Secondary | ICD-10-CM | POA: Diagnosis not present

## 2022-11-23 DIAGNOSIS — F3130 Bipolar disorder, current episode depressed, mild or moderate severity, unspecified: Secondary | ICD-10-CM | POA: Diagnosis not present

## 2022-11-25 ENCOUNTER — Other Ambulatory Visit (HOSPITAL_COMMUNITY): Payer: Self-pay

## 2022-11-25 DIAGNOSIS — M25511 Pain in right shoulder: Secondary | ICD-10-CM | POA: Diagnosis not present

## 2022-11-25 DIAGNOSIS — M25611 Stiffness of right shoulder, not elsewhere classified: Secondary | ICD-10-CM | POA: Diagnosis not present

## 2022-11-27 DIAGNOSIS — M25611 Stiffness of right shoulder, not elsewhere classified: Secondary | ICD-10-CM | POA: Diagnosis not present

## 2022-11-27 DIAGNOSIS — M25511 Pain in right shoulder: Secondary | ICD-10-CM | POA: Diagnosis not present

## 2022-11-28 ENCOUNTER — Ambulatory Visit: Payer: Commercial Managed Care - PPO | Admitting: Family Medicine

## 2022-11-28 ENCOUNTER — Encounter: Payer: Self-pay | Admitting: Family Medicine

## 2022-11-28 VITALS — BP 122/78 | HR 98 | Ht 71.0 in | Wt 310.0 lb

## 2022-11-28 DIAGNOSIS — D509 Iron deficiency anemia, unspecified: Secondary | ICD-10-CM

## 2022-11-28 DIAGNOSIS — E039 Hypothyroidism, unspecified: Secondary | ICD-10-CM | POA: Diagnosis not present

## 2022-11-28 DIAGNOSIS — R519 Headache, unspecified: Secondary | ICD-10-CM

## 2022-11-28 DIAGNOSIS — E559 Vitamin D deficiency, unspecified: Secondary | ICD-10-CM | POA: Diagnosis not present

## 2022-11-28 DIAGNOSIS — F3181 Bipolar II disorder: Secondary | ICD-10-CM | POA: Diagnosis not present

## 2022-11-28 DIAGNOSIS — G8929 Other chronic pain: Secondary | ICD-10-CM

## 2022-11-28 DIAGNOSIS — E876 Hypokalemia: Secondary | ICD-10-CM | POA: Diagnosis not present

## 2022-11-28 DIAGNOSIS — M2550 Pain in unspecified joint: Secondary | ICD-10-CM | POA: Diagnosis not present

## 2022-11-28 MED ORDER — NURTEC 75 MG PO TBDP
1.0000 | ORAL_TABLET | Freq: Once | ORAL | 0 refills | Status: DC | PRN
Start: 2022-11-28 — End: 2023-03-14

## 2022-11-28 MED ORDER — LIDOCAINE 5 % EX PTCH: 1.0000 | MEDICATED_PATCH | Freq: Two times a day (BID) | CUTANEOUS | 0 refills | Status: DC

## 2022-11-28 NOTE — Progress Notes (Signed)
Acute Office Visit  Subjective:     Patient ID: Tracy [name], female    DOB: [birth year], 57 y.o.   MRN: [id number]  Chief Complaint  Patient presents with   Fatigue    HPI Patient is in today for back pain and neck pain. She recently had shoulder surgery 6 weeks (two rotator cuff tears and a biceps tear). She has a history of falls. She is due to return to work on Monday. She is having multiple joint pain. She has had a steady headache for one week and a half. She notes pain when getting up and says she yells out when she wakes up due to pain and has to walk really slow. She had been checked for lupus in the past. 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 She has been taking meloxicam, tylenol, flexiril, heating pads, voltaren gel. These symptoms have been going on for two weeks.   She says she can't ride of her headache. She has a prescription for fiorcet but this does not help it. She has been having it consistently for 2 weeks. Her stress level has decreased.   Review of Systems  Constitutional:  Negative for chills and fever.  Respiratory:  Negative for cough and shortness of breath.   Cardiovascular:  Negative for chest pain.  Musculoskeletal:  Positive for back pain.  Neurological:  Positive for headaches.      Objective:    BP 122/78   Pulse 98   Ht  (1.803 m)   Wt (!) 310 lb (140.6 kg)   LMP 08/24/2020   SpO2 97%   BMI 43.24 kg/m   Physical Exam Vitals and nursing note reviewed.  Constitutional:      General: She is not in acute distress.    Appearance: Normal appearance.  HENT:     Head: Normocephalic and atraumatic.     Right Ear: External ear normal.     Left Ear: External ear normal.     Nose: Nose normal.  Eyes:     Conjunctiva/sclera: Conjunctivae normal.  Cardiovascular:     Rate and Rhythm: Normal rate and regular rhythm.  Pulmonary:     Effort: Pulmonary effort is normal.     Breath  sounds: Normal breath sounds.  Neurological:     General: No focal deficit present.     Mental Status: She is alert and oriented to person, place, and time.  Psychiatric:        Mood and Affect: Mood normal.        Behavior: Behavior normal.        Thought Content: Thought content normal.        Judgment: Judgment normal.     No results found for any visits on 11/28/22.      Assessment & Plan:   Problem List Items Addressed This Visit       Endocrine   Hypothyroidism   Relevant Orders   TSH + free T4     Other   Vitamin D deficiency    - will obtain labs and see if this is the cause of her fatigue      Relevant Orders   Vitamin D (25 hydroxy)   Joint pain    - pt is having joint pain in multiple areas. We will go ahead and get a metabolic workup to rule out autoimmune diseases.  She is saying that she is having  a biopsy of possible autoimmune disorders in her mouth.  Will also go ahead and obtain iron levels as well -Have ordered lidocaine patches for patient to see if we can get her symptom relief - She will follow-up with PCP in a few weeks      Relevant Medications   lidocaine (LIDODERM) 5 %   Other Relevant Orders   ANA,IFA RA Diag Pnl w/rflx Tit/Patn   Chronic nonintractable headache - Primary    Patient has been taking Fioricet and has had no migraine relief.  Due to treatment failure, we will go ahead try Nurtec.  I have also sent a referral to neurology.  She has been to a neurologist in the past.  I am hoping I am hopeful we can get her some migraine relief.  Tell her to keep a headache diary of her symptoms as it sounds like she is having more than 15 migraines a month.      Relevant Medications   Rimegepant Sulfate (NURTEC) 75 MG TBDP   Other Relevant Orders   Ambulatory referral to Neurology   Other Visit Diagnoses     Hypokalemia       Relevant Orders   BASIC METABOLIC PANEL WITH GFR   Microcytic anemia       Relevant Orders   Fe+TIBC+Fer        Meds ordered this encounter  Medications   Rimegepant Sulfate (NURTEC) 75 MG TBDP    Sig: Take 1 tablet (75 mg total) by mouth once as needed for up to 1 dose (migraines).    Dispense:  30 tablet    Refill:  0   lidocaine (LIDODERM) 5 %    Sig: Place 1 patch onto the skin every 12 (twelve) hours. Remove & Discard patch within 12 hours or as directed by MD    Dispense:  30 patch    Refill:  0    Return in about 4 weeks (around 12/26/2022) for with PCP.  Charlton Amor, DO

## 2022-11-28 NOTE — Assessment & Plan Note (Signed)
Patient has been taking Fioricet and has had no migraine relief.  Due to treatment failure, we will go ahead try Nurtec.  I have also sent a referral to neurology.  She has been to a neurologist in the past.  I am hoping I am hopeful we can get her some migraine relief.  Tell her to keep a headache diary of her symptoms as it sounds like she is having more than 15 migraines a month.

## 2022-11-28 NOTE — Assessment & Plan Note (Signed)
-   will obtain labs and see if this is the cause of her fatigue

## 2022-11-28 NOTE — Assessment & Plan Note (Addendum)
-   pt is having joint pain in multiple areas. We will go ahead and get a metabolic workup to rule out autoimmune diseases.  She is saying that she is having a biopsy of possible autoimmune disorders in her mouth.  Will also go ahead and obtain iron levels as well -Have ordered lidocaine patches for patient to see if we can get her symptom relief - She will follow-up with PCP in a few weeks

## 2022-11-29 ENCOUNTER — Other Ambulatory Visit: Payer: Self-pay

## 2022-11-29 ENCOUNTER — Telehealth: Payer: Self-pay

## 2022-11-29 ENCOUNTER — Other Ambulatory Visit (HOSPITAL_COMMUNITY): Payer: Self-pay

## 2022-11-29 LAB — T4, FREE: Free T4: 1.1 ng/dL (ref 0.8–1.8)

## 2022-11-29 LAB — IRON,TIBC AND FERRITIN PANEL
%SAT: 28 % (calc) (ref 16–45)
Ferritin: 89 ng/mL (ref 16–232)

## 2022-11-29 LAB — BASIC METABOLIC PANEL WITH GFR
BUN: 14 mg/dL (ref 7–25)
Calcium: 9.8 mg/dL (ref 8.6–10.4)
Chloride: 103 mmol/L (ref 98–110)
Creat: 0.95 mg/dL (ref 0.50–1.03)
Sodium: 140 mmol/L (ref 135–146)

## 2022-11-29 NOTE — Telephone Encounter (Addendum)
Initiated Prior authorization ZOX:[id number] 75MG  dispersible tablets Via: Covermymeds Case/Key:[id number]  Status: approved as of 11/29/22 Reason:good through 11/29/22-05/31/23 Notified Pt via: Mychart

## 2022-11-29 NOTE — Telephone Encounter (Signed)
Patient requests update/needs prior authorization for ;  Nurtect 75mg  tbdp Please advise

## 2022-11-29 NOTE — Telephone Encounter (Signed)
Patient states Nurteq needing a PA. She is requesting this be done asap. States currently has headache and would like to not go the weekend without the medication. Thank you Francesco Runner.

## 2022-12-01 LAB — BASIC METABOLIC PANEL WITH GFR
CO2: 27 mmol/L (ref 20–32)
Glucose, Bld: 137 mg/dL — ABNORMAL HIGH (ref 65–99)
Potassium: 3.5 mmol/L (ref 3.5–5.3)
eGFR: 71 mL/min/{1.73_m2} (ref >=60)

## 2022-12-01 LAB — ANA,IFA RA DIAG PNL W/RFLX TIT/PATN
Anti Nuclear Antibody (ANA): NEGATIVE
Cyclic Citrullin Peptide Ab: <16 UNITS
Rheumatoid fact SerPl-aCnc: <10 IU/mL (ref <14)

## 2022-12-01 LAB — VITAMIN D 25 HYDROXY (VIT D DEFICIENCY, FRACTURES): Vit D, 25-Hydroxy: 53 ng/mL (ref 30–100)

## 2022-12-01 LAB — IRON,TIBC AND FERRITIN PANEL
Iron: 64 ug/dL (ref 45–160)
TIBC: 229 mcg/dL (calc) — ABNORMAL LOW (ref 250–450)

## 2022-12-01 LAB — TSH+FREE T4: TSH W/REFLEX TO FT4: 8.38 mIU/L — ABNORMAL HIGH

## 2022-12-02 ENCOUNTER — Other Ambulatory Visit: Payer: Self-pay

## 2022-12-02 ENCOUNTER — Other Ambulatory Visit: Payer: Self-pay | Admitting: Family Medicine

## 2022-12-02 DIAGNOSIS — E039 Hypothyroidism, unspecified: Secondary | ICD-10-CM

## 2022-12-03 DIAGNOSIS — M25511 Pain in right shoulder: Secondary | ICD-10-CM | POA: Diagnosis not present

## 2022-12-03 DIAGNOSIS — M25611 Stiffness of right shoulder, not elsewhere classified: Secondary | ICD-10-CM | POA: Diagnosis not present

## 2022-12-05 ENCOUNTER — Encounter (HOSPITAL_COMMUNITY): Payer: Self-pay | Admitting: Orthopedic Surgery

## 2022-12-05 DIAGNOSIS — M25611 Stiffness of right shoulder, not elsewhere classified: Secondary | ICD-10-CM | POA: Diagnosis not present

## 2022-12-05 DIAGNOSIS — M25511 Pain in right shoulder: Secondary | ICD-10-CM | POA: Diagnosis not present

## 2022-12-06 DIAGNOSIS — M2672 Alveolar mandibular hyperplasia: Secondary | ICD-10-CM | POA: Diagnosis not present

## 2022-12-06 DIAGNOSIS — L539 Erythematous condition, unspecified: Secondary | ICD-10-CM | POA: Diagnosis not present

## 2022-12-06 DIAGNOSIS — Z7985 Long-term (current) use of injectable non-insulin antidiabetic drugs: Secondary | ICD-10-CM | POA: Diagnosis not present

## 2022-12-06 DIAGNOSIS — Z79899 Other long term (current) drug therapy: Secondary | ICD-10-CM | POA: Diagnosis not present

## 2022-12-06 DIAGNOSIS — I10 Essential (primary) hypertension: Secondary | ICD-10-CM | POA: Diagnosis not present

## 2022-12-06 DIAGNOSIS — L438 Other lichen planus: Secondary | ICD-10-CM | POA: Diagnosis not present

## 2022-12-06 DIAGNOSIS — Z6841 Body Mass Index (BMI) 40.0 and over, adult: Secondary | ICD-10-CM | POA: Diagnosis not present

## 2022-12-06 DIAGNOSIS — G4730 Sleep apnea, unspecified: Secondary | ICD-10-CM | POA: Diagnosis not present

## 2022-12-06 DIAGNOSIS — K061 Gingival enlargement: Secondary | ICD-10-CM | POA: Diagnosis not present

## 2022-12-06 DIAGNOSIS — S025XXA Fracture of tooth (traumatic), initial encounter for closed fracture: Secondary | ICD-10-CM | POA: Diagnosis not present

## 2022-12-06 DIAGNOSIS — K029 Dental caries, unspecified: Secondary | ICD-10-CM | POA: Diagnosis not present

## 2022-12-06 DIAGNOSIS — K068 Other specified disorders of gingiva and edentulous alveolar ridge: Secondary | ICD-10-CM | POA: Diagnosis not present

## 2022-12-06 DIAGNOSIS — E119 Type 2 diabetes mellitus without complications: Secondary | ICD-10-CM | POA: Diagnosis not present

## 2022-12-06 DIAGNOSIS — X58XXXA Exposure to other specified factors, initial encounter: Secondary | ICD-10-CM | POA: Diagnosis not present

## 2022-12-13 ENCOUNTER — Other Ambulatory Visit: Payer: Self-pay | Admitting: Adult Health

## 2022-12-13 ENCOUNTER — Other Ambulatory Visit (INDEPENDENT_AMBULATORY_CARE_PROVIDER_SITE_OTHER): Payer: Self-pay | Admitting: Family Medicine

## 2022-12-13 DIAGNOSIS — M25611 Stiffness of right shoulder, not elsewhere classified: Secondary | ICD-10-CM | POA: Diagnosis not present

## 2022-12-13 DIAGNOSIS — E538 Deficiency of other specified B group vitamins: Secondary | ICD-10-CM

## 2022-12-13 DIAGNOSIS — F410 Panic disorder [episodic paroxysmal anxiety] without agoraphobia: Secondary | ICD-10-CM

## 2022-12-13 DIAGNOSIS — M25511 Pain in right shoulder: Secondary | ICD-10-CM | POA: Diagnosis not present

## 2022-12-14 ENCOUNTER — Other Ambulatory Visit (HOSPITAL_COMMUNITY): Payer: Self-pay

## 2022-12-15 NOTE — Telephone Encounter (Signed)
Please call to schedule an appt, past due.  

## 2022-12-16 ENCOUNTER — Other Ambulatory Visit: Payer: Self-pay

## 2022-12-16 MED ORDER — FOLIC ACID 1 MG PO TABS
1.0000 mg | ORAL_TABLET | Freq: Every day | ORAL | 0 refills | Status: DC
Start: 2022-12-16 — End: 2023-03-10
  Filled 2022-12-16: qty 30, 30d supply, fill #0

## 2022-12-16 NOTE — Telephone Encounter (Signed)
Lvm for patient to call and schedule 

## 2022-12-19 ENCOUNTER — Ambulatory Visit: Payer: Commercial Managed Care - PPO | Admitting: Sports Medicine

## 2022-12-19 ENCOUNTER — Other Ambulatory Visit: Payer: Self-pay

## 2022-12-19 MED ORDER — CLONAZEPAM 1 MG PO TABS
1.0000 mg | ORAL_TABLET | Freq: Three times a day (TID) | ORAL | 2 refills | Status: DC
Start: 2022-12-19 — End: 2023-04-15
  Filled 2022-12-19: qty 90, 30d supply, fill #0
  Filled 2023-01-12 – 2023-01-29 (×2): qty 90, 30d supply, fill #1
  Filled 2023-03-13: qty 90, 30d supply, fill #2

## 2022-12-19 NOTE — Telephone Encounter (Signed)
Pt called for refill on Clonazepam.  Pls send to   [facility] [address], [place] [address] Phone: [phone]  Fax: [phone]   Next appt 5/30

## 2022-12-20 DIAGNOSIS — M25611 Stiffness of right shoulder, not elsewhere classified: Secondary | ICD-10-CM | POA: Diagnosis not present

## 2022-12-20 DIAGNOSIS — M25511 Pain in right shoulder: Secondary | ICD-10-CM | POA: Diagnosis not present

## 2022-12-23 ENCOUNTER — Other Ambulatory Visit (HOSPITAL_BASED_OUTPATIENT_CLINIC_OR_DEPARTMENT_OTHER): Payer: Self-pay

## 2022-12-23 ENCOUNTER — Ambulatory Visit: Payer: Commercial Managed Care - PPO | Admitting: Sports Medicine

## 2022-12-23 DIAGNOSIS — M1712 Unilateral primary osteoarthritis, left knee: Secondary | ICD-10-CM

## 2022-12-23 DIAGNOSIS — M2550 Pain in unspecified joint: Secondary | ICD-10-CM

## 2022-12-23 MED ORDER — PREGABALIN 50 MG PO CAPS
ORAL_CAPSULE | ORAL | 3 refills | Status: DC
Start: 2022-12-23 — End: 2023-03-14
  Filled 2022-12-23: qty 90, 30d supply, fill #0
  Filled 2023-02-12: qty 90, 30d supply, fill #1

## 2022-12-23 MED ORDER — PREGABALIN 50 MG PO CAPS
ORAL_CAPSULE | ORAL | 3 refills | Status: DC
Start: 2022-12-23 — End: 2022-12-23

## 2022-12-23 NOTE — Assessment & Plan Note (Signed)
Tracy [name] has increasing knee pain, she has known osteoarthritis, we did an injection back in February that unfortunately did not help. She is also doing analgesics without sufficient improvement. We will add Lyrica to help her widespread pain including her knee pain and if ineffective we will try to get her approved for Visco.

## 2022-12-23 NOTE — Addendum Note (Signed)
Addended by: Monica Becton on: 12/23/2022 02:53 PM   Modules accepted: Orders

## 2022-12-23 NOTE — Assessment & Plan Note (Signed)
Service having multiple widespread aches and pains, she has hyperalgesia and allodynia on exam. She did have a rheumatoid workup with ANA, RF, CCP that was negative. I am going to go ahead and add CK levels as well as some other inflammatory markers. Adding Lyrica as below, up taper, I think this will help her widespread aches and pains. She is also on Crestor, I would like her to try a week off of Crestor to see if this helps.

## 2022-12-23 NOTE — Progress Notes (Signed)
    Procedures performed today:    None.  Independent interpretation of notes and tests performed by another provider:   None.  Brief History, Exam, Impression, and Recommendations:    Primary osteoarthritis of left knee Tracy [name] has increasing knee pain, she has known osteoarthritis, we did an injection back in February that unfortunately did not help. She is also doing analgesics without sufficient improvement. We will add Lyrica to help her widespread pain including her knee pain and if ineffective we will try to get her approved for [facility].  Polyarthralgia Service having multiple widespread aches and pains, she has hyperalgesia and allodynia on exam. She did have a rheumatoid workup with ANA, RF, CCP that was negative. I am going to go ahead and add CK levels as well as some other inflammatory markers. Adding Lyrica as below, up taper, I think this will help her widespread aches and pains. She is also on Crestor, I would like her to try a week off of Crestor to see if this helps.    ____________________________________________ Ihor Austin. Benjamin Stain, M.D., ABFM., CAQSM., [name]. Primary Care and Sports Medicine [facility]  Adjunct Professor of Family Medicine  [place] [employer] [facility] [employer]  Restaurant manager, fast food

## 2022-12-24 LAB — CK: Total CK: 102 U/L (ref 29–143)

## 2022-12-24 LAB — SEDIMENTATION RATE: Sed Rate: 22 mm/h (ref 0–30)

## 2022-12-24 LAB — URIC ACID: Uric Acid, Serum: 3.5 mg/dL (ref 2.5–7.0)

## 2022-12-25 ENCOUNTER — Encounter: Payer: Self-pay | Admitting: Sports Medicine

## 2022-12-25 DIAGNOSIS — M1712 Unilateral primary osteoarthritis, left knee: Secondary | ICD-10-CM

## 2022-12-25 DIAGNOSIS — S83207D Unspecified tear of unspecified meniscus, current injury, left knee, subsequent encounter: Secondary | ICD-10-CM

## 2022-12-31 DIAGNOSIS — F3181 Bipolar II disorder: Secondary | ICD-10-CM | POA: Diagnosis not present

## 2023-01-02 ENCOUNTER — Telehealth (INDEPENDENT_AMBULATORY_CARE_PROVIDER_SITE_OTHER): Payer: Self-pay | Admitting: Adult Health

## 2023-01-02 DIAGNOSIS — Z48814 Encounter for surgical aftercare following surgery on the teeth or oral cavity: Secondary | ICD-10-CM | POA: Diagnosis not present

## 2023-01-02 DIAGNOSIS — Z0389 Encounter for observation for other suspected diseases and conditions ruled out: Secondary | ICD-10-CM

## 2023-01-02 NOTE — Progress Notes (Signed)
Patient no show appointment. ? ?

## 2023-01-10 ENCOUNTER — Encounter: Payer: Self-pay | Admitting: Family Medicine

## 2023-01-10 ENCOUNTER — Encounter (INDEPENDENT_AMBULATORY_CARE_PROVIDER_SITE_OTHER): Payer: Commercial Managed Care - PPO | Admitting: Sports Medicine

## 2023-01-10 DIAGNOSIS — M170 Bilateral primary osteoarthritis of knee: Secondary | ICD-10-CM | POA: Diagnosis not present

## 2023-01-10 MED ORDER — KETOROLAC TROMETHAMINE 10 MG PO TABS
10.0000 mg | ORAL_TABLET | Freq: Three times a day (TID) | ORAL | 0 refills | Status: DC | PRN
Start: 2023-01-10 — End: 2024-03-30

## 2023-01-10 NOTE — Telephone Encounter (Signed)
I spent 5 total minutes of online digital evaluation and management services in this patient-initiated request for online care. 

## 2023-01-12 ENCOUNTER — Other Ambulatory Visit: Payer: Self-pay | Admitting: Adult Health

## 2023-01-12 ENCOUNTER — Other Ambulatory Visit: Payer: Self-pay | Admitting: Family Medicine

## 2023-01-12 DIAGNOSIS — G4733 Obstructive sleep apnea (adult) (pediatric): Secondary | ICD-10-CM

## 2023-01-13 ENCOUNTER — Other Ambulatory Visit (HOSPITAL_COMMUNITY): Payer: Self-pay

## 2023-01-13 ENCOUNTER — Other Ambulatory Visit: Payer: Self-pay

## 2023-01-13 ENCOUNTER — Other Ambulatory Visit: Payer: Self-pay | Admitting: Family Medicine

## 2023-01-13 ENCOUNTER — Encounter (HOSPITAL_COMMUNITY): Payer: Self-pay

## 2023-01-13 DIAGNOSIS — F3181 Bipolar II disorder: Secondary | ICD-10-CM | POA: Diagnosis not present

## 2023-01-13 MED ORDER — SYNTHROID 300 MCG PO TABS
300.0000 ug | ORAL_TABLET | Freq: Every day | ORAL | 1 refills | Status: DC
  Filled 2023-01-13 – 2023-02-20 (×2): qty 90, 90d supply, fill #0

## 2023-01-13 MED ORDER — ARMODAFINIL 200 MG PO TABS
200.0000 mg | ORAL_TABLET | Freq: Every day | ORAL | 0 refills | Status: DC | PRN
Start: 2023-01-13 — End: 2023-02-09
  Filled 2023-01-13: qty 30, 30d supply, fill #0

## 2023-01-13 MED ORDER — PANTOPRAZOLE SODIUM 40 MG PO TBEC
80.0000 mg | DELAYED_RELEASE_TABLET | Freq: Two times a day (BID) | ORAL | 3 refills | Status: DC
  Filled 2023-01-13: qty 60, 15d supply, fill #0
  Filled 2023-02-09: qty 60, 15d supply, fill #1
  Filled 2023-03-13: qty 60, 15d supply, fill #2
  Filled 2023-03-31: qty 60, 15d supply, fill #3

## 2023-01-13 MED ORDER — FUROSEMIDE 40 MG PO TABS
40.0000 mg | ORAL_TABLET | Freq: Every day | ORAL | 3 refills | Status: DC
  Filled 2023-01-13: qty 30, 30d supply, fill #0
  Filled 2023-03-13: qty 30, 30d supply, fill #1
  Filled 2023-04-15: qty 30, 30d supply, fill #2
  Filled 2023-05-14: qty 30, 30d supply, fill #3

## 2023-01-16 ENCOUNTER — Encounter: Payer: Self-pay | Admitting: Family Medicine

## 2023-01-16 ENCOUNTER — Ambulatory Visit: Payer: Commercial Managed Care - PPO | Admitting: Family Medicine

## 2023-01-16 VITALS — BP 114/73 | HR 79 | Ht 71.0 in | Wt 310.0 lb

## 2023-01-16 DIAGNOSIS — G43911 Migraine, unspecified, intractable, with status migrainosus: Secondary | ICD-10-CM

## 2023-01-16 DIAGNOSIS — M1990 Unspecified osteoarthritis, unspecified site: Secondary | ICD-10-CM

## 2023-01-16 DIAGNOSIS — M2550 Pain in unspecified joint: Secondary | ICD-10-CM | POA: Diagnosis not present

## 2023-01-16 MED ORDER — TRAMADOL HCL 50 MG PO TABS: 50.0000 mg | ORAL_TABLET | Freq: Three times a day (TID) | ORAL | 0 refills | Status: DC | PRN

## 2023-01-16 MED ORDER — DEXAMETHASONE SODIUM PHOSPHATE 10 MG/ML IJ SOLN
10.0000 mg | Freq: Once | INTRAMUSCULAR | Status: AC
Start: 2023-01-16 — End: 2023-01-16
  Administered 2023-01-16: 10 mg via INTRAMUSCULAR

## 2023-01-17 ENCOUNTER — Other Ambulatory Visit: Payer: Self-pay

## 2023-01-18 ENCOUNTER — Ambulatory Visit (INDEPENDENT_AMBULATORY_CARE_PROVIDER_SITE_OTHER): Payer: Commercial Managed Care - PPO

## 2023-01-18 DIAGNOSIS — M25462 Effusion, left knee: Secondary | ICD-10-CM | POA: Diagnosis not present

## 2023-01-18 DIAGNOSIS — M1712 Unilateral primary osteoarthritis, left knee: Secondary | ICD-10-CM

## 2023-01-19 ENCOUNTER — Encounter: Payer: Self-pay | Admitting: Family Medicine

## 2023-01-19 NOTE — Assessment & Plan Note (Addendum)
She has had some relief with combination of Nurtec and Toradol.  Given injection of Decadron today to help reduce rebound.  Recommend monitoring glucose closely.

## 2023-01-19 NOTE — Assessment & Plan Note (Signed)
Continues to have left knee pain.  She may continue Tylenol.  No significant relief from Toradol.  Adding tramadol as needed.  She will also follow-up with Dr. Benjamin Stain.

## 2023-01-19 NOTE — Progress Notes (Signed)
Tracy [name] - 57 y.o. female MRN [id number]  Date of birth: [birth year]  Subjective Chief Complaint  Patient presents with   Knee Pain    HPI Tracy [name] is a 57 year old female here today with complaint of left knee pain as well as migraine.  She has been Dr. Benjamin Stain for knee pain and has arthritic changes of the left knee.  She has noted some swelling of the knee.  She did have injection which helped briefly.  Dr. Benjamin Stain did call in p.o. Toradol for her recently but she did not really have any relief from this.  She was using Tylenol without much relief.    She also has had a headache for a few days.  Toradol has seemed to help some but this but she still has dull headache.  Nurtec also seems to have helped some as well.  Denies any neurological changes.  ROS:  A comprehensive ROS was completed and negative except as noted per HPI  Allergies  Allergen Reactions   Sulfa Antibiotics Itching, Swelling and Rash   Food     Walnut-mouth swelling/itching   Bupropion Hcl Itching and Rash   Codeine Itching   Sulfamethoxazole-Trimethoprim Rash    Past Medical History:  Diagnosis Date   Acanthosis nigricans, acquired 02/09/2015   Anxiety and depression 10/31/2014   Arthritis    back, knees, right elbow   Arthritis of left hip 06/25/2018   Bilateral swelling of feet and ankles    Bipolar disorder (HCC)    Borderline personality disorder (HCC)    Cancer (HCC)    oral lichen planus, uses mouth wash, cream   Carpal tunnel syndrome 09/06/2015   Chewing difficulty    Chronic left shoulder pain 05/17/2020   Constipation    Coronary artery disease 03/21/2021   COVID 10/08/2021   Cystic teratoma    BENIGN   Dental crowns present    Diabetes mellitus without complication (HCC)    Diabetes mellitus, type 2 (HCC) 06/10/2013   Diarrhea    Diverticulosis of colon without hemorrhage    DOE (dyspnea on exertion) 03/21/2021   Dyspepsia 12/14/2020   Essential hypertension 10/02/2007    Qualifier: Diagnosis of  By: Everardo All MD, Sean A    Facial rash 06/16/2019   Female hirsutism 02/09/2015   Food allergy    Walnuts   GAD (generalized anxiety disorder)    Gastritis and gastroduodenitis    Gastroesophageal reflux disease    Grade II internal hemorrhoids    Greater trochanteric bursitis of left hip 07/24/2018   Hemorrhoids, internal 07/18/2017   History of kidney stones    History of migraine headaches    History of posttraumatic stress disorder (PTSD)    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/2015   Hypothyroidism    HYPOTHYROIDISM, POSTSURGICAL 10/02/2007   Qualifier: Diagnosis of  By: Everardo All MD, Sean A    IBS (irritable bowel syndrome)    Infertility associated with anovulation 02/09/2015   Insulin resistance 02/09/2015   Joint pain    Leg swelling 09/21/2020   Lichen planus    Liver cirrhosis secondary to NASH (nonalcoholic steatohepatitis) (HCC)    Lower back pain 07/18/2017   MDD (major depressive disorder) 06/06/2015   Migraine 10/02/2007   Morbid obesity with BMI of 40.0-44.9, adult (HCC) 06/23/2020   NASH (nonalcoholic steatohepatitis)    Nausea without vomiting    Nonalcoholic fatty liver disease 02/09/2015   Obstructive sleep apnea 06/27/2014   Oral mucosal lesion 06/17/2019   Osteoarthritis    Other fatigue  PCOS (polycystic ovarian syndrome)    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   Qualifier: Diagnosis of  By: Everardo All MD, Sean A    Trigeminal neuralgia    Unilateral primary osteoarthritis, left hip 02/10/2019   Vitamin D deficiency     Past Surgical History:  Procedure Laterality Date   ACHILLES TENDON SURGERY Left 2007   APPENDECTOMY     CARPAL TUNNEL RELEASE Right 01/12/2016   Procedure: RIGHT CARPAL TUNNEL RELEASE;  Surgeon: Dominica Severin, MD;  Location: [facility];  Service: Orthopedics;  Laterality: Right;   COLONOSCOPY WITH PROPOFOL N/A 04/20/2014   Procedure: COLONOSCOPY WITH PROPOFOL;  Surgeon: Willis Modena, MD;  Location: [facility];  Service: Endoscopy;  Laterality: N/A;   COLONOSCOPY WITH PROPOFOL N/A 01/10/2021   Procedure: COLONOSCOPY WITH PROPOFOL;  Surgeon: Shellia Cleverly, DO;  Location: [facility];  Service: Gastroenterology;  Laterality: N/A;   ESOPHAGOGASTRODUODENOSCOPY (EGD) WITH PROPOFOL N/A 04/20/2014   Procedure: ESOPHAGOGASTRODUODENOSCOPY (EGD) WITH PROPOFOL;  Surgeon: Willis Modena, MD;  Location: [facility];  Service: Endoscopy;  Laterality: N/A;   ESOPHAGOGASTRODUODENOSCOPY (EGD) WITH PROPOFOL N/A 01/10/2021   Procedure: ESOPHAGOGASTRODUODENOSCOPY (EGD) WITH PROPOFOL;  Surgeon: Shellia Cleverly, DO;  Location: [facility];  Service: Gastroenterology;  Laterality: N/A;   EXCISION HAGLUND'S DEFORMITY WITH ACHILLES TENDON REPAIR Right 02/25/2013   Procedure: RIGHT ACHILLES DEBRIDEMENT AND RECONSTRUCTION;  HAGLUND'S EXCISION; GASTROC RECESSION AND FLEXOR HALLUCIS LONGUS TRANSFER;  Surgeon: Toni Arthurs, MD;  Location: [facility];  Service: Orthopedics;  Laterality: Right;   JOINT REPLACEMENT     hip   LUMBAR LAMINECTOMY     X 3   neck fusion     OVARIAN CYST SURGERY Left 1998   PILONIDAL CYST EXCISION  1994   RIGHT OOPHORECTOMY     SHOULDER ARTHROSCOPY WITH ROTATOR CUFF REPAIR Right 10/17/2022   Procedure: Right shoulder arthroscopy, debridement, subacromial  decompression, distal clavicle resection, rotator cuff repair;  Surgeon: Francena Hanly, MD;  Location: [facility];  Service: Orthopedics;  Laterality: Right;    STERIOD INJECTION Left 01/12/2016   Procedure: STEROID INJECTION LEFT WRIST;  Surgeon: Dominica Severin, MD;  Location: [facility];  Service: Orthopedics;  Laterality: Left;   TOTAL HIP ARTHROPLASTY Left 03/19/2019   Procedure: LEFT TOTAL HIP ARTHROPLASTY ANTERIOR APPROACH;  Surgeon: Kathryne Hitch, MD;  Location: [facility];  Service: Orthopedics;  Laterality: Left;   TOTAL THYROIDECTOMY  2003   UPPER GI ENDOSCOPY  01/19/2015    Social History   Socioeconomic History   Marital status: Divorced    Spouse name: Not on file   Number of children: 0   Years of education: 16   Highest education level: Bachelor's degree (e.g., BA, AB, BS)  Occupational History   Occupation: RN-Telephone Triage Nurse    Employer: [employer]  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   Drug use: No   Sexual activity: Not Currently    Partners: Male    Birth control/protection: Condom  Other Topics Concern   Not on file  Social History Narrative   RN - [employer]   Regular exercise: no   Caffeine use: 2 x daily   Right-handed.   Lives alone.   Social Determinants of Health   Financial Resource Strain: Medium Risk (12/22/2022)   Overall Financial Resource Strain (CARDIA)    Difficulty of Paying Living Expenses: Somewhat hard  Food Insecurity: No Food Insecurity  (12/22/2022)   Hunger Vital Sign    Worried About Running Out of Food in the Last Year: Never true    Ran Out of Food in the Last Year: Never true  Transportation Needs: No Transportation Needs (12/22/2022)   PRAPARE - Administrator, Civil Service (Medical): No    Lack of Transportation (Non-Medical): No  Physical Activity: Unknown (12/22/2022)   Exercise Vital Sign    Days of Exercise per Week: 0 days    Minutes of Exercise per Session: Not on file  Stress: Stress Concern Present (12/22/2022)   [employer] of Occupational Health - Occupational Stress Questionnaire    Feeling of Stress : Very much  Social Connections: Socially Isolated (12/22/2022)   Social Connection and Isolation Panel [NHANES]    Frequency of Communication with Friends and Family: Three times a week    Frequency of Social Gatherings with Friends and Family: Never    Attends Religious Services: Never    Database administrator or Organizations: No    Attends Engineer, structural: Not on file    Marital Status: Divorced    Family History  Problem Relation Age of Onset   Heart disease Father    Depression Father    Heart attack Father    Sudden death Father    Anxiety disorder Father    Bipolar disorder Father    Obesity Father    Cancer Brother        THROAT cancer   Depression Brother    Anxiety disorder Brother    Depression Sister    Breast cancer Mother    Hypertension Mother    Cancer Mother    Depression Mother    Sleep apnea Mother    Obesity Mother    Depression Maternal Grandmother    Stroke Maternal Grandfather    Colon cancer Brother    Pancreatic cancer Neg Hx    Stomach cancer Neg Hx    Liver disease Neg Hx     Health Maintenance  Topic Date Due   PAP SMEAR-Modifier  Never done   Diabetic kidney evaluation - Urine ACR  01/31/2018   COVID-19 Vaccine (7 - 2023-24 season) 06/18/2022   OPHTHALMOLOGY EXAM  09/19/2022   Hepatitis C Screening  08/23/2023  (Originally 10/28/1983)  HIV Screening  08/23/2023 (Originally 10/29/1982)   INFLUENZA VACCINE  03/06/2023   HEMOGLOBIN A1C  04/13/2023   FOOT EXAM  07/26/2023   MAMMOGRAM  08/10/2023   Diabetic kidney evaluation - eGFR measurement  11/28/2023   DTaP/Tdap/Td (2 - Td or Tdap) 01/03/2028   Colonoscopy  01/11/2031   Zoster Vaccines- Shingrix  Completed   HPV VACCINES  Aged Out     ----------------------------------------------------------------------------------------------------------------------------------------------------------------------------------------------------------------- Physical Exam BP 114/73 (BP Location: Left Arm, Patient Position: Sitting, Cuff Size: Normal)   Pulse 79   Ht 5\' 11"  (1.803 m)   Wt (!) 310 lb (140.6 kg)   LMP 08/24/2020   SpO2 98%   BMI 43.24 kg/m   Physical Exam Constitutional:      Appearance: Normal appearance.  HENT:     Head: Normocephalic and atraumatic.  Eyes:     General: No scleral icterus. Musculoskeletal:     Comments: Left knee with small effusion.  Tenderness palpation along bilateral joint line  Neurological:     Mental Status: She is alert.  Psychiatric:        Mood and Affect: Mood normal.        Behavior: Behavior normal.     ------------------------------------------------------------------------------------------------------------------------------------------------------------------------------------------------------------------- Assessment and Plan  Migraine She has had some relief with combination of Nurtec and Toradol.  Given injection of Decadron today to help reduce rebound.  Recommend monitoring glucose closely.  Arthritis Continues to have left knee pain.  She may continue Tylenol.  No significant relief from Toradol.  Adding tramadol as needed.  She will also follow-up with Dr. Benjamin Stain.   Meds ordered this encounter  Medications   traMADol (ULTRAM) 50 MG tablet    Sig: Take 1-2 tablets (50-100 mg  total) by mouth every 8 (eight) hours as needed for up to 5 days for severe pain.    Dispense:  30 tablet    Refill:  0   dexamethasone (DECADRON) injection 10 mg    No follow-ups on file.    This visit occurred during the SARS-CoV-2 public health emergency.  Safety protocols were in place, including screening questions prior to the visit, additional usage of staff PPE, and extensive cleaning of exam room while observing appropriate contact time as indicated for disinfecting solutions.

## 2023-01-21 ENCOUNTER — Encounter: Payer: Self-pay | Admitting: Adult Health

## 2023-01-21 ENCOUNTER — Telehealth (INDEPENDENT_AMBULATORY_CARE_PROVIDER_SITE_OTHER): Payer: Commercial Managed Care - PPO | Admitting: Adult Health

## 2023-01-21 DIAGNOSIS — F4310 Post-traumatic stress disorder, unspecified: Secondary | ICD-10-CM

## 2023-01-21 DIAGNOSIS — F411 Generalized anxiety disorder: Secondary | ICD-10-CM | POA: Diagnosis not present

## 2023-01-21 DIAGNOSIS — F410 Panic disorder [episodic paroxysmal anxiety] without agoraphobia: Secondary | ICD-10-CM | POA: Diagnosis not present

## 2023-01-21 DIAGNOSIS — F332 Major depressive disorder, recurrent severe without psychotic features: Secondary | ICD-10-CM | POA: Diagnosis not present

## 2023-01-21 NOTE — Progress Notes (Signed)
Tracy [name] [id number] 10/27/1965 57 y.o.  Virtual Visit via Video Note  I connected with pt @ on 01/21/23 at  5:00 PM EDT by a video enabled telemedicine application and verified that I am speaking with the correct person using two identifiers.   I discussed the limitations of evaluation and management by telemedicine and the availability of in person appointments. The patient expressed understanding and agreed to proceed.  I discussed the assessment and treatment plan with the patient. The patient was provided an opportunity to ask questions and all were answered. The patient agreed with the plan and demonstrated an understanding of the instructions.   The patient was advised to call back or seek an in-person evaluation if the symptoms worsen or if the condition fails to improve as anticipated.  I provided 25 minutes of non-face-to-face time during this encounter.  The patient was located at home.  The provider was located at [facility].   Tracy Gibbs, NP   Subjective:   Patient ID:  Tracy [name] is a 57 y.o. (DOB September 05, 1965) female.  Chief Complaint: No chief complaint on file.   HPI Tracy [name] presents for follow-up of MDD, GAD, PTSD, and Panic attacks.  Describes mood today as "ok". Pleasant. Reports tearfulness. Mood symptoms - reports decreased depression, anxiety and irritability. Reports some worry, rumination, and over thinking. Denies recent panic attacks. Mood is  consistent. Stating "I feel like I'm doing alright".Feels like medications are helpful. Recovered from rotator cuff surgery. Reports struggling with knee pain. Seeing therapist regularly. Decreased interest and motivation. Taking medications as prescribed. Energy levels lower. Active, does not have a regular exercise routine. Walking dogs. Enjoys some usual interests and activities. Lives alone with 3 dogs (shares with ex). Recently divorced. Siblings local. Spending time with family.  Attends church. Appetite adequate. Weight stable - 310 pounds.  Sleeps well most nights. Averages 3 to 4 hours. Sleep apnea - taking Nuvigil in the morning.  Denies SI or HI. Denies AH or VH.  Denies self harm. Denies substance use. Working with [facility] - therapy.  Previous medication trials:  Antipsychotics: Vraylar-TD, Latuda, Abilify, Geodon, Rexulti, Lybalvi, Risperdal  Mood Stabilizers - Depakote, Lithium, Lamictal, Equetro, Topamax, Trileptal, Gabapentin  SSRI - Zoloft, Lexapro, Celexa, Prozac, Viibryd, Trintellix, Prozac  SNRI - Pristiq, Effexor, Cymbalta  Wellbutrin - allergic reaction  Anti-anxiety - Buspar, Xanax, Clonazepam, Ativan, Valium  Sleep agents - Trazadone, Ambien, Restoril   Other - Deplin, Emsam, Serzone, Doxepin, Nuvigil, Vistaril  Previous treatment: ECT and TMS  Review of Systems:  Review of Systems  Musculoskeletal:  Negative for gait problem.  Neurological:  Negative for tremors.  Psychiatric/Behavioral:         Please refer to HPI    Medications: I have reviewed the patient's current medications.  Current Outpatient Medications  Medication Sig Dispense Refill   Armodafinil 200 MG TABS Take 1 Tablet by mouth daily as needed (sleepiness). 30 tablet 0   Blood Glucose Monitoring Suppl (FREESTYLE LITE) w/Device KIT Use as directed. 1 kit 0   Blood Glucose Monitoring Suppl (FREESTYLE LITE) w/Device KIT Check fasting blood sugar every morning and before meals up to 4 times daily. 1 kit PRN   clobetasol (TEMOVATE) 0.05 % GEL Apply small amount to affected area twice daily using finger. Gently pat dry with cotton gauze.  Do not eat,drink, or speak for 30 minutes to allow absorption. 30 g 2   clonazePAM (KLONOPIN) 1 MG tablet Take 1 tablet (  1 mg total) by mouth 3 (three) times daily. 90 tablet 2   clotrimazole (MYCELEX) 10 MG troche Dissolve 1 tablet by mouth 2 times a day. Dissolve tablet slowly and completely on days you use topical steroid. 70  Troche 2   Continuous Blood Gluc Sensor (DEXCOM G6 SENSOR) MISC Replace every 10 days.  Use to check blood sugar. 9 each 1   Continuous Blood Gluc Transmit (DEXCOM G6 TRANSMITTER) MISC Use to check continuous blood sugar.  Replace every 90 days 1 each 3   cyclobenzaprine (FLEXERIL) 10 MG tablet Take 1 tablet (10 mg total) by mouth 3 (three) times daily as needed for muscle spasms. 30 tablet 1   diclofenac Sodium (VOLTAREN) 1 % GEL Apply 1 Application topically 4 (four) times daily as needed (pain).     ferrous sulfate 325 (65 FE) MG EC tablet Take 1 tablet (325 mg total) by mouth in the morning and at bedtime. Take iron supplement 2 hours before or 4 hours after PPI or other antacids as this requires some level of gastric acidity to aid in absorption (Patient taking differently: Take 325 mg by mouth daily. Take iron supplement 2 hours before or 4 hours after PPI or other antacids as this requires some level of gastric acidity to aid in absorption) 60 tablet 5   folic acid (FOLVITE) 1 MG tablet Take 1 tablet (1 mg total) by mouth daily. 30 tablet 0   furosemide (LASIX) 40 MG tablet Take 1 tablet (40 mg total) by mouth daily. 30 tablet 3   glucose blood (FREESTYLE LITE) test strip Use as instructed 100 each 12   glucose blood (FREESTYLE LITE) test strip Check fasting blood sugar every morning and before meals up to 4 times a day as directed. 100 strip 12   hydrOXYzine (ATARAX) 25 MG tablet Take 1 tablet (25 mg total) by mouth 3 (three) times daily as needed for anxiety. 90 tablet 3   insulin lispro (HUMALOG KWIKPEN) 100 UNIT/ML KwikPen Inject up to 16 units 3 times daily at mealtime per sliding scale. (Patient taking differently: Inject 3-16 Units into the skin 3 (three) times daily as needed (when taking steroids).) 15 mL 11   Insulin Pen Needle (UNIFINE PENTIPS) 31G X 5 MM MISC Use as directed to inject insulin 100 each 3   ketorolac (TORADOL) 10 MG tablet Take 1 tablet (10 mg total) by mouth every 8  (eight) hours as needed. 20 tablet 0   lamoTRIgine (LAMICTAL) 200 MG tablet Take 2 tablets by mouth at bedtime. 180 tablet 1   Lancets 30G MISC Check fasting blood sugar every morning and before meals up to 4 times daily. 100 each 12   lidocaine (LIDODERM) 5 % Place 1 patch onto the skin every 12 (twelve) hours. Remove & Discard patch within 12 hours or as directed by MD 30 patch 0   lidocaine (XYLOCAINE) 5 % ointment Apply topically to affected areas 3 times daily as needed. 35.44 g 0   lisinopril (ZESTRIL) 10 MG tablet Take 1 tablet (10 mg total) by mouth daily. 90 tablet 3   meloxicam (MOBIC) 15 MG tablet Take 1 tablet by mouth once every morning with a meal for 2 weeks, then daily as needed for pain. 30 tablet 0   Menthol, Topical Analgesic, (BENGAY EX) Apply 1 Application topically daily as needed (pain).     neomycin-bacitracin-polymyxin [phone] OINT Apply topically 2 (two) times daily for 10 days To corners of mouth (Patient taking differently: Apply  1 Application topically 2 (two) times daily as needed (cracking/ raw skin around mouth).) 14 g 2   nystatin (MYCOSTATIN) 100000 UNIT/ML suspension Use 5ml to swish and swallow 4  times daily for 10 days 200 mL 0   nystatin (MYCOSTATIN) 100000 UNIT/ML suspension Use 5ml to swish in mouth and swallow --use 4 times daily for 10 days 180 mL 0   nystatin (NYAMYC) powder Apply 3 times daily as directed. (Patient taking differently: Apply 1 application  topically 3 (three) times daily as needed (rash).) 60 g 3   nystatin ointment (MYCOSTATIN) Apply to the skin 2 times daily to corners of mouth (Patient taking differently: Apply 1 Application topically 2 (two) times daily as needed (raw/cracking skin around the mouth).) 15 g 2   ondansetron (ZOFRAN) 4 MG tablet Take 1 tablet (4 mg total) by mouth every 8 (eight) hours as needed for nausea or vomiting. 10 tablet 0   ondansetron (ZOFRAN-ODT) 4 MG disintegrating tablet Dissolve 1 tablet (4 mg total) by  mouth 2 (two) times daily as needed for nausea or vomiting. 20 tablet 1   pantoprazole (PROTONIX) 40 MG tablet Take 2 tablets (80 mg total) by mouth 2 (two) times daily. 60 tablet 3   pimecrolimus (ELIDEL) 1 % cream Apply a small amount to affected area 2 x daily. Gently pat area dry w/cotton gauze. No eating, drinking or speaking for 20 - 30 minutes to allow medicine to be absorbed. Can apply to 2x2 gauze & placed against the affected area. (Patient taking differently: Apply 1 Application topically 2 (two) times daily as needed (ulcers).) 60 g 3   pregabalin (LYRICA) 50 MG capsule Take 1 capsule by mouth nightly for a week then take 1 capsule twice daily for a week then take 1 capsule three times daily until finished. 90 capsule 3   Rimegepant Sulfate (NURTEC) 75 MG TBDP Take 1 tablet (75 mg total) by mouth once as needed for up to 1 dose (migraines). 30 tablet 0   risperiDONE (RISPERDAL) 1 MG tablet Take 1.5 tablets (1.5 mg total) by mouth at bedtime. 135 tablet 0   rosuvastatin (CRESTOR) 20 MG tablet TAKE 1 TABLET BY MOUTH DAILY. 90 tablet 3   sertraline (ZOLOFT) 100 MG tablet Take 2 tablets (200 mg total) by mouth every morning. 180 tablet 3   spironolactone (ALDACTONE) 50 MG tablet Take 1 tablet (50 mg total) by mouth 2 (two) times daily. 180 tablet 2   SYNTHROID 300 MCG tablet Take 1 tablet (300 mcg total) by mouth daily before breakfast. 90 tablet 1   tirzepatide (MOUNJARO) 12.5 MG/0.5ML Pen Inject 12.5 mg into the skin once a week. 6 mL 1   traMADol (ULTRAM) 50 MG tablet Take 1-2 tablets (50-100 mg total) by mouth every 8 (eight) hours as needed for up to 5 days for severe pain. 30 tablet 0   No current facility-administered medications for this visit.    Medication Side Effects: None  Allergies:  Allergies  Allergen Reactions   Sulfa Antibiotics Itching, Swelling and Rash   Food     Walnut-mouth swelling/itching   Bupropion Hcl Itching and Rash   Codeine Itching    Sulfamethoxazole-Trimethoprim Rash    Past Medical History:  Diagnosis Date   Acanthosis nigricans, acquired 02/09/2015   Anxiety and depression 10/31/2014   Arthritis    back, knees, right elbow   Arthritis of left hip 06/25/2018   Bilateral swelling of feet and ankles    Bipolar disorder (HCC)  Borderline personality disorder (HCC)    Cancer (HCC)    oral lichen planus, uses mouth wash, cream   Carpal tunnel syndrome 09/06/2015   Chewing difficulty    Chronic left shoulder pain 05/17/2020   Constipation    Coronary artery disease 03/21/2021   COVID 10/08/2021   Cystic teratoma    BENIGN   Dental crowns present    Diabetes mellitus without complication (HCC)    Diabetes mellitus, type 2 (HCC) 06/10/2013   Diarrhea    Diverticulosis of colon without hemorrhage    DOE (dyspnea on exertion) 03/21/2021   Dyspepsia 12/14/2020   Essential hypertension 10/02/2007   Qualifier: Diagnosis of  By: Everardo All MD, Gregary Signs A    Facial rash 06/16/2019   Female hirsutism 02/09/2015   Food allergy    Walnuts   GAD (generalized anxiety disorder)    Gastritis and gastroduodenitis    Gastroesophageal reflux disease    Grade II internal hemorrhoids    Greater trochanteric bursitis of left hip 07/24/2018   Hemorrhoids, internal 07/18/2017   History of kidney stones    History of migraine headaches    History of posttraumatic stress disorder (PTSD)    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/2015   Hypothyroidism    HYPOTHYROIDISM, POSTSURGICAL 10/02/2007   Qualifier: Diagnosis of  By: Everardo All MD, Sean A    IBS (irritable bowel syndrome)    Infertility associated with anovulation 02/09/2015   Insulin resistance 02/09/2015   Joint pain    Leg swelling 09/21/2020   Lichen planus    Liver cirrhosis secondary to NASH (nonalcoholic steatohepatitis) (HCC)    Lower back pain 07/18/2017   MDD (major depressive disorder) 06/06/2015   Migraine 10/02/2007   Morbid obesity with BMI of 40.0-44.9, adult (HCC)  06/23/2020   NASH (nonalcoholic steatohepatitis)    Nausea without vomiting    Nonalcoholic fatty liver disease 02/09/2015   Obstructive sleep apnea 06/27/2014   Oral mucosal lesion 06/17/2019   Osteoarthritis    Other fatigue    PCOS (polycystic ovarian syndrome)    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   Qualifier: Diagnosis of  By: Everardo All MD, Sean A    Trigeminal neuralgia    Unilateral primary osteoarthritis, left hip 02/10/2019   Vitamin D deficiency     Family History  Problem Relation Age of Onset   Heart disease Father    Depression Father    Heart attack Father    Sudden death Father    Anxiety disorder Father    Bipolar disorder Father    Obesity Father    Cancer Brother        THROAT cancer   Depression Brother    Anxiety disorder Brother    Depression Sister    Breast cancer Mother    Hypertension Mother    Cancer Mother    Depression  Mother    Sleep apnea Mother    Obesity Mother    Depression Maternal Grandmother    Stroke Maternal Grandfather    Colon cancer Brother    Pancreatic cancer Neg Hx    Stomach cancer Neg Hx    Liver disease Neg Hx     Social History   Socioeconomic History   Marital status: Divorced    Spouse name: Not on file   Number of children: 0   Years of education: 16   Highest education level: Bachelor's degree  (e.g., BA, AB, BS)  Occupational History   Occupation: RN-Telephone Triage Nurse    Employer: [employer]    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   Drug use: No   Sexual activity: Not Currently    Partners: Male    Birth control/protection: Condom  Other Topics Concern   Not on file  Social History Narrative   RN -    Regular exercise: no   Caffeine use: 2 x daily   Right-handed.   Lives alone.   Social Determinants of Health   Financial Resource Strain: Medium Risk (12/22/2022)   Overall Financial Resource Strain (CARDIA)    Difficulty of Paying Living Expenses: Somewhat hard  Food Insecurity: No Food Insecurity (12/22/2022)   Hunger Vital Sign    Worried About Running Out of Food in the Last Year: Never true    Ran Out of Food in the Last Year: Never true  Transportation Needs: No Transportation Needs (12/22/2022)   PRAPARE - Administrator, Civil Service (Medical): No    Lack of Transportation (Non-Medical): No  Physical Activity: Unknown (12/22/2022)   Exercise Vital Sign    Days of Exercise per Week: 0 days    Minutes of Exercise per Session: Not on file  Stress: Stress Concern Present (12/22/2022)   [employer] of Occupational Health - Occupational Stress Questionnaire    Feeling of Stress : Very much  Social Connections: Socially Isolated (12/22/2022)   Social Connection and Isolation Panel [NHANES]    Frequency of Communication with Friends and Family: Three times a week     Frequency of Social Gatherings with Friends and Family: Never    Attends Religious Services: Never    Database administrator or Organizations: No    Attends Engineer, structural: Not on file    Marital Status: Divorced  Catering manager Violence: Not on file    Past Medical History, Surgical history, Social history, and Family history were reviewed and updated as appropriate.   Please see review of systems for further details on the patient's review from today.   Objective:   Physical Exam:  LMP 08/24/2020   Physical Exam Constitutional:      General: She is not in acute distress. Musculoskeletal:        General: No deformity.  Neurological:     Mental  Status: She is alert and oriented to person, place, and time.     Coordination: Coordination normal.  Psychiatric:        Attention and Perception: Attention and perception normal. She does not perceive auditory or visual hallucinations.        Mood and Affect: Mood normal. Mood is not anxious or depressed. Affect is not labile, blunt, angry or inappropriate.        Speech: Speech normal.        Behavior: Behavior normal.        Thought Content: Thought content normal. Thought content is not paranoid or delusional. Thought content does not include homicidal or suicidal ideation. Thought content does not include homicidal or suicidal plan.        Cognition and Memory: Cognition and memory normal.        Judgment: Judgment normal.     Comments: Insight intact     Lab Review:     Component Value Date/Time   NA 140 11/28/2022 1509   NA 140 10/11/2020 1041   K 3.5 11/28/2022 1509   CL 103 11/28/2022 1509   CO2 27 11/28/2022 1509   GLUCOSE 137 (H) 11/28/2022 1509   BUN 14 11/28/2022 1509   BUN 14 10/11/2020 1041   CREATININE 0.95 11/28/2022 1509   CALCIUM 9.8 11/28/2022 1509   PROT 7.1 10/11/2022 1450   PROT 7.1 10/11/2020 1041   ALBUMIN 3.7 10/11/2022 1450   ALBUMIN 4.2 10/11/2020 1041   AST 24 10/11/2022 1450    ALT 28 10/11/2022 1450   ALKPHOS 102 10/11/2022 1450   BILITOT 0.5 10/11/2022 1450   BILITOT <0.2 10/11/2020 1041   GFRNONAA >60 10/11/2022 1450   GFRNONAA 99 12/26/2020 0000   GFRAA 115 12/26/2020 0000       Component Value Date/Time   WBC 6.0 10/11/2022 1450   RBC 4.49 10/11/2022 1450   HGB 13.5 10/11/2022 1450   HGB 10.9 (L) 10/11/2020 1041   HCT 40.1 10/11/2022 1450   HCT 35.6 10/11/2020 1041   PLT 152 10/11/2022 1450   PLT 224 10/11/2020 1041   MCV 89.3 10/11/2022 1450   MCV 78 (L) 10/11/2020 1041   MCH 30.1 10/11/2022 1450   MCHC 33.7 10/11/2022 1450   RDW 14.8 10/11/2022 1450   RDW 17.5 (H) 10/11/2020 1041   LYMPHSABS 1,448 02/14/2021 0000   LYMPHSABS 1.7 10/11/2020 1041   MONOABS 0.3 02/11/2021 1516   EOSABS 141 02/14/2021 0000   EOSABS 0.2 10/11/2020 1041   BASOSABS 22 02/14/2021 0000   BASOSABS 0.0 10/11/2020 1041    No results found for: "POCLITH", "LITHIUM"   No results found for: "PHENYTOIN", "PHENOBARB", "VALPROATE", "CBMZ"   .res Assessment: Plan:    Plan:  PDMP reviewed  Risperdal 1.5 mg at hs  Lamictal 200mg  daily Zoloft 100mg  - 2 daily Nuvigil 200mg  daily - take every day Clonazepam 1mg  Hydroxyzine 25mg  TID - using 1 tablet at bedtime.   Time spent with patient was 25 minutes. Greater than 50% of face to face time with patient was spent on counseling and coordination of care.      [facility] - therapist  RTC 3 months  Patient advised to contact office with any questions, adverse effects, or acute worsening in signs and symptoms.  Counseled patient regarding potential benefits, risks, and side effects of Lamictal to include potential risk of Stevens-Johnson syndrome. Advised patient to stop taking Lamictal and contact office immediately if rash develops and to seek urgent medical  attention if rash is severe and/or spreading quickly.   Discussed potential benefits, risk, and side effects of benzodiazepines to include potential risk  of tolerance and dependence, as well as possible drowsiness. Advised patient not to drive if experiencing drowsiness and to take lowest possible effective dose to minimize risk of dependence and tolerance.  Discussed potential metabolic side effects associated with atypical antipsychotics, as well as potential risk for movement side effects. Advised pt to contact office if movement side effects occur.    Diagnoses and all orders for this visit:  Severe episode of recurrent major depressive disorder, without psychotic features (HCC)  GAD (generalized anxiety disorder)  PTSD (post-traumatic stress disorder)  Panic attacks     Please see After Visit Summary for patient specific instructions.  Future Appointments  Date Time Provider Department Center  02/03/2023  4:15 PM Monica Becton, MD [facility] None  03/20/2023  2:00 PM Levert Feinstein, MD [facility] None    No orders of the defined types were placed in this encounter.     -------------------------------

## 2023-01-22 ENCOUNTER — Encounter: Payer: Self-pay | Admitting: Sports Medicine

## 2023-01-27 ENCOUNTER — Encounter: Payer: Self-pay | Admitting: Sports Medicine

## 2023-01-28 DIAGNOSIS — F3181 Bipolar II disorder: Secondary | ICD-10-CM | POA: Diagnosis not present

## 2023-01-29 ENCOUNTER — Other Ambulatory Visit (HOSPITAL_BASED_OUTPATIENT_CLINIC_OR_DEPARTMENT_OTHER): Payer: Self-pay

## 2023-01-30 ENCOUNTER — Other Ambulatory Visit: Payer: Self-pay

## 2023-02-03 ENCOUNTER — Other Ambulatory Visit (INDEPENDENT_AMBULATORY_CARE_PROVIDER_SITE_OTHER): Payer: Commercial Managed Care - PPO

## 2023-02-03 ENCOUNTER — Ambulatory Visit: Payer: Commercial Managed Care - PPO | Admitting: Sports Medicine

## 2023-02-03 ENCOUNTER — Telehealth: Payer: Self-pay | Admitting: Sports Medicine

## 2023-02-03 DIAGNOSIS — M170 Bilateral primary osteoarthritis of knee: Secondary | ICD-10-CM | POA: Diagnosis not present

## 2023-02-03 NOTE — Telephone Encounter (Signed)
[employer] approval please, bilateral, x-ray confirmed osteoarthritis, failed multiple injections and physical therapy

## 2023-02-03 NOTE — Patient Instructions (Signed)

## 2023-02-03 NOTE — Progress Notes (Signed)
    Procedures performed today:    Procedure: Real-time Ultrasound Guided injection of the left knee Device: Samsung HS60  Verbal informed consent obtained.  Time-out conducted.  Noted no overlying erythema, induration, or other signs of local infection.  Skin prepped in a sterile fashion.  Local anesthesia: Topical Ethyl chloride.  With sterile technique and under real time ultrasound guidance: Mild effusion noted, 1 cc Kenalog 40, 2 cc lidocaine, 2 cc bupivacaine injected easily Completed without difficulty  Advised to call if fevers/chills, erythema, induration, drainage, or persistent bleeding.  Images permanently stored and available for review in PACS.  Impression: Technically successful ultrasound guided injection.  Procedure: Real-time Ultrasound Guided injection of the right knee Device: Samsung HS60  Verbal informed consent obtained.  Time-out conducted.  Noted no overlying erythema, induration, or other signs of local infection.  Skin prepped in a sterile fashion.  Local anesthesia: Topical Ethyl chloride.  With sterile technique and under real time ultrasound guidance: Mild effusion noted, 1 cc Kenalog 40, 2 cc lidocaine, 2 cc bupivacaine injected easily Completed without difficulty  Advised to call if fevers/chills, erythema, induration, drainage, or persistent bleeding.  Images permanently stored and available for review in PACS.  Impression: Technically successful ultrasound guided injection.  Independent interpretation of notes and tests performed by another provider:   None.  Brief History, Exam, Impression, and Recommendations:    Primary osteoarthritis of both knees Increasing pain both knees, BMI over 40 so unable to have an arthroplasty, we injected both of her knees today. We will get her approved for viscosupplementation. I would also like her to discuss genicular artery embolization with IR.    ____________________________________________ Ihor Austin.  Benjamin Stain, M.D., ABFM., CAQSM., [name]. Primary Care and Sports Medicine [facility]  Adjunct Professor of Family Medicine  [employer] [facility] [employer]  Restaurant manager, fast food

## 2023-02-03 NOTE — Assessment & Plan Note (Signed)
Increasing pain both knees, BMI over 40 so unable to have an arthroplasty, we injected both of her knees today. We will get her approved for viscosupplementation. I would also like her to discuss genicular artery embolization with IR.

## 2023-02-05 NOTE — Telephone Encounter (Signed)
PA information submitted via [email or web address] for Orthovisc Paperwork has been printed and given to Dr. T for signatures. Once obtained, information will be faxed to [employer] at [phone]  

## 2023-02-09 ENCOUNTER — Other Ambulatory Visit: Payer: Self-pay | Admitting: Adult Health

## 2023-02-09 DIAGNOSIS — G4733 Obstructive sleep apnea (adult) (pediatric): Secondary | ICD-10-CM

## 2023-02-10 ENCOUNTER — Other Ambulatory Visit (HOSPITAL_COMMUNITY): Payer: Self-pay

## 2023-02-10 ENCOUNTER — Other Ambulatory Visit: Payer: Self-pay

## 2023-02-10 NOTE — Telephone Encounter (Signed)
Due 7/9

## 2023-02-12 ENCOUNTER — Other Ambulatory Visit (HOSPITAL_BASED_OUTPATIENT_CLINIC_OR_DEPARTMENT_OTHER): Payer: Self-pay

## 2023-02-12 ENCOUNTER — Other Ambulatory Visit: Payer: Self-pay

## 2023-02-12 ENCOUNTER — Other Ambulatory Visit (HOSPITAL_COMMUNITY): Payer: Self-pay

## 2023-02-12 NOTE — Telephone Encounter (Signed)
Benefits Investigation Details received from [employer] Injection: Orthovisc PA required: Yes PA form and medical records faxed to [phone] at 2:12  Fax confirmation received 2:14 OV Copay/Coinsurance: $0 Product Copay: 0% Administration Coinsurance: 0% Administration Copay: 0% Deductible: $500 (met: $500) Out of Pocket Max: $7900 (met: $7900)

## 2023-02-13 ENCOUNTER — Other Ambulatory Visit: Payer: Self-pay

## 2023-02-13 MED ORDER — ARMODAFINIL 200 MG PO TABS
200.0000 mg | ORAL_TABLET | Freq: Every day | ORAL | 0 refills | Status: DC | PRN
Start: 2023-02-13 — End: 2023-03-14
  Filled 2023-02-13: qty 30, 30d supply, fill #0

## 2023-02-17 ENCOUNTER — Other Ambulatory Visit: Payer: Self-pay

## 2023-02-17 ENCOUNTER — Telehealth: Payer: Self-pay | Admitting: Adult Health

## 2023-02-17 DIAGNOSIS — F331 Major depressive disorder, recurrent, moderate: Secondary | ICD-10-CM

## 2023-02-17 DIAGNOSIS — F410 Panic disorder [episodic paroxysmal anxiety] without agoraphobia: Secondary | ICD-10-CM

## 2023-02-17 DIAGNOSIS — F4310 Post-traumatic stress disorder, unspecified: Secondary | ICD-10-CM

## 2023-02-17 DIAGNOSIS — F339 Major depressive disorder, recurrent, unspecified: Secondary | ICD-10-CM

## 2023-02-17 DIAGNOSIS — F411 Generalized anxiety disorder: Secondary | ICD-10-CM

## 2023-02-17 DIAGNOSIS — F603 Borderline personality disorder: Secondary | ICD-10-CM

## 2023-02-17 MED ORDER — RISPERIDONE 1 MG PO TABS
1.5000 mg | ORAL_TABLET | Freq: Every day | ORAL | 2 refills | Status: DC
Start: 2023-02-17 — End: 2023-05-14
  Filled 2023-02-17: qty 45, 30d supply, fill #0
  Filled 2023-03-13: qty 45, 30d supply, fill #1
  Filled 2023-04-15: qty 45, 30d supply, fill #2

## 2023-02-17 NOTE — Telephone Encounter (Signed)
Sent!

## 2023-02-17 NOTE — Telephone Encounter (Signed)
Next visit is 04/25/23. Requesting refill on Risperidone called to:  [facility]   Phone: [phone]  Fax: [phone]

## 2023-02-19 NOTE — Telephone Encounter (Signed)
Still awaiting approval 

## 2023-02-20 ENCOUNTER — Other Ambulatory Visit: Payer: Self-pay

## 2023-02-20 ENCOUNTER — Other Ambulatory Visit (HOSPITAL_COMMUNITY): Payer: Self-pay

## 2023-02-21 ENCOUNTER — Other Ambulatory Visit: Payer: Self-pay

## 2023-02-25 DIAGNOSIS — F3181 Bipolar II disorder: Secondary | ICD-10-CM | POA: Diagnosis not present

## 2023-03-06 ENCOUNTER — Encounter: Payer: Self-pay | Admitting: Obstetrics and Gynecology

## 2023-03-06 ENCOUNTER — Other Ambulatory Visit (HOSPITAL_COMMUNITY)
Admission: RE | Admit: 2023-03-06 | Discharge: 2023-03-06 | Disposition: A | Discharge status: Home / Self Care | Payer: Commercial Managed Care - PPO | Source: Ambulatory Visit | Attending: Obstetrics and Gynecology | Admitting: Obstetrics and Gynecology

## 2023-03-06 ENCOUNTER — Ambulatory Visit (INDEPENDENT_AMBULATORY_CARE_PROVIDER_SITE_OTHER): Payer: Commercial Managed Care - PPO | Admitting: Obstetrics and Gynecology

## 2023-03-06 VITALS — BP 106/71 | HR 89 | Ht 71.0 in | Wt 296.0 lb

## 2023-03-06 DIAGNOSIS — Z01419 Encounter for gynecological examination (general) (routine) without abnormal findings: Secondary | ICD-10-CM | POA: Insufficient documentation

## 2023-03-06 DIAGNOSIS — N950 Postmenopausal bleeding: Secondary | ICD-10-CM | POA: Diagnosis not present

## 2023-03-06 NOTE — Progress Notes (Signed)
Subjective:     Tracy [name] is a 57 y.o. female P0 postmenopausal for at least 5 years with BMI 41 who is here for a comprehensive physical exam. The patient reports no problems. Patient recalls a 2-day episode of postmenopausal vaginal spotting which she noticed when she wiped. She denies any further episodes of bleeding since. Patient is not sexually active. She reports some vasomotor symptoms which are managed with estroven. Patient denies urinary symptoms or incontinence. Patient suffers from IBS and had a recent colonoscopy. Patient is without any other complaints. Patient reports the presence of a right ovarian dermoid cyst which has been stable for years. She denies any pelvic pain  Past Medical History:  Diagnosis Date   Acanthosis nigricans, acquired 02/09/2015   Anxiety and depression 10/31/2014   Arthritis    back, knees, right elbow   Arthritis of left hip 06/25/2018   Bilateral swelling of feet and ankles    Bipolar disorder (HCC)    Borderline personality disorder (HCC)    Cancer (HCC)    oral lichen planus, uses mouth wash, cream   Carpal tunnel syndrome 09/06/2015   Chewing difficulty    Chronic left shoulder pain 05/17/2020   Constipation    Coronary artery disease 03/21/2021   COVID 10/08/2021   Cystic teratoma    BENIGN   Dental crowns present    Diabetes mellitus without complication (HCC)    Diabetes mellitus, type 2 (HCC) 06/10/2013   Diarrhea    Diverticulosis of colon without hemorrhage    DOE (dyspnea on exertion) 03/21/2021   Dyspepsia 12/14/2020   Essential hypertension 10/02/2007   Qualifier: Diagnosis of  By: Everardo All MD, Sean A    Facial rash 06/16/2019   Female hirsutism 02/09/2015   Food allergy    Walnuts   GAD (generalized anxiety disorder)    Gastritis and gastroduodenitis    Gastroesophageal reflux disease    Grade II internal hemorrhoids    Greater trochanteric bursitis of left hip 07/24/2018   Hemorrhoids, internal 07/18/2017    History of kidney stones    History of migraine headaches    History of posttraumatic stress disorder (PTSD)    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/2015   Hypothyroidism    HYPOTHYROIDISM, POSTSURGICAL 10/02/2007   Qualifier: Diagnosis of  By: Everardo All MD, Sean A    IBS (irritable bowel syndrome)    Infertility associated with anovulation 02/09/2015   Insulin resistance 02/09/2015   Joint pain    Leg swelling 09/21/2020   Lichen planus    Liver cirrhosis secondary to NASH (nonalcoholic steatohepatitis) (HCC)    Lower back pain 07/18/2017   MDD (major depressive disorder) 06/06/2015   Migraine 10/02/2007   Morbid obesity with BMI of 40.0-44.9, adult (HCC) 06/23/2020   NASH (nonalcoholic steatohepatitis)    Nausea without vomiting    Nonalcoholic fatty liver disease 02/09/2015   Obstructive sleep apnea 06/27/2014   Oral mucosal lesion 06/17/2019   Osteoarthritis    Other fatigue    PCOS (polycystic ovarian syndrome)    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   Qualifier: Diagnosis of  By: Everardo All MD, Sean A    Trigeminal neuralgia    Unilateral primary osteoarthritis, left hip 02/10/2019   Vitamin D deficiency    Past Surgical History:  Procedure Laterality Date   ACHILLES TENDON SURGERY Left 2007   APPENDECTOMY     CARPAL TUNNEL RELEASE Right 01/12/2016   Procedure: RIGHT CARPAL  TUNNEL RELEASE;  Surgeon: Dominica Severin, MD;  Location: [facility];  Service: Orthopedics;  Laterality: Right;   COLONOSCOPY WITH PROPOFOL N/A 04/20/2014   Procedure: COLONOSCOPY WITH PROPOFOL;  Surgeon: Willis Modena, MD;  Location: [facility];  Service: Endoscopy;  Laterality: N/A;   COLONOSCOPY WITH PROPOFOL N/A 01/10/2021   Procedure: COLONOSCOPY WITH PROPOFOL;  Surgeon: Shellia Cleverly, DO;  Location: [facility];  Service: Gastroenterology;  Laterality: N/A;   ESOPHAGOGASTRODUODENOSCOPY (EGD) WITH PROPOFOL N/A 04/20/2014   Procedure: ESOPHAGOGASTRODUODENOSCOPY (EGD) WITH PROPOFOL;  Surgeon: Willis Modena, MD;  Location: [facility];  Service: Endoscopy;  Laterality: N/A;   ESOPHAGOGASTRODUODENOSCOPY (EGD) WITH PROPOFOL N/A 01/10/2021   Procedure: ESOPHAGOGASTRODUODENOSCOPY (EGD) WITH PROPOFOL;  Surgeon: Shellia Cleverly, DO;  Location: [facility];  Service: Gastroenterology;  Laterality: N/A;   EXCISION HAGLUND'S DEFORMITY WITH ACHILLES TENDON REPAIR Right 02/25/2013   Procedure: RIGHT ACHILLES DEBRIDEMENT AND RECONSTRUCTION;  HAGLUND'S EXCISION; GASTROC RECESSION AND FLEXOR HALLUCIS LONGUS TRANSFER;  Surgeon: Toni Arthurs, MD;  Location: [facility];  Service: Orthopedics;  Laterality: Right;   JOINT REPLACEMENT     hip   LUMBAR LAMINECTOMY     X 3   neck fusion     OVARIAN CYST SURGERY Left 1998   PILONIDAL CYST EXCISION  1994   RIGHT OOPHORECTOMY     SHOULDER ARTHROSCOPY WITH ROTATOR CUFF REPAIR Right 10/17/2022   Procedure: Right shoulder arthroscopy, debridement, subacromial decompression, distal clavicle resection, rotator cuff repair;  Surgeon: Francena Hanly, MD;  Location: [facility];  Service: Orthopedics;  Laterality: Right;    STERIOD INJECTION Left 01/12/2016   Procedure: STEROID INJECTION LEFT WRIST;  Surgeon: Dominica Severin, MD;  Location:  SURGERY CENTER;  Service: Orthopedics;  Laterality: Left;   TOTAL HIP ARTHROPLASTY Left  03/19/2019   Procedure: LEFT TOTAL HIP ARTHROPLASTY ANTERIOR APPROACH;  Surgeon: Kathryne Hitch, MD;  Location: [facility];  Service: Orthopedics;  Laterality: Left;   TOTAL THYROIDECTOMY  2003   UPPER GI ENDOSCOPY  01/19/2015   Family History  Problem Relation Age of Onset   Heart disease Father    Depression Father    Heart attack Father    Sudden death Father    Anxiety disorder Father    Bipolar disorder Father    Obesity Father    Cancer Brother        THROAT cancer   Depression Brother    Anxiety disorder Brother    Depression Sister    Breast cancer Mother    Hypertension Mother    Cancer Mother    Depression Mother    Sleep apnea Mother    Obesity Mother    Depression Maternal Grandmother    Stroke Maternal Grandfather    Colon cancer Brother    Pancreatic cancer Neg Hx    Stomach cancer Neg Hx    Liver disease Neg Hx  Social History   Socioeconomic History   Marital status: Divorced    Spouse name: Not on file   Number of children: 0   Years of education: 16   Highest education level: Bachelor's degree (e.g., BA, AB, BS)  Occupational History   Occupation: RN-Telephone Triage Nurse    Employer: [employer]    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   Drug use: No   Sexual activity: Not Currently    Partners: Male    Birth control/protection: Condom  Other Topics Concern   Not on file  Social History Narrative   RN - [employer]   Regular exercise: no    Caffeine use: 2 x daily   Right-handed.   Lives alone.   Social Determinants of Health   Financial Resource Strain: Medium Risk (12/22/2022)   Overall Financial Resource Strain (CARDIA)    Difficulty of Paying Living Expenses: Somewhat hard  Food Insecurity: No Food Insecurity (12/22/2022)   Hunger Vital Sign    Worried About Running Out of Food in the Last Year: Never true    Ran Out of Food in the Last Year: Never true  Transportation Needs: No Transportation Needs (12/22/2022)   PRAPARE - Administrator, Civil Service (Medical): No    Lack of Transportation (Non-Medical): No  Physical Activity: Unknown (12/22/2022)   Exercise Vital Sign    Days of Exercise per Week: 0 days    Minutes of Exercise per Session: Not on file  Stress: Stress Concern Present (12/22/2022)   [employer] of Occupational Health - Occupational Stress Questionnaire    Feeling of Stress : Very much  Social Connections: Socially Isolated (12/22/2022)   Social Connection and Isolation Panel [NHANES]    Frequency of Communication with Friends and Family: Three times a week    Frequency of Social Gatherings with Friends and Family: Never    Attends Religious Services: Never    Database administrator or Organizations: No    Attends Engineer, structural: Not on file    Marital Status: Divorced  Intimate Partner Violence: Unknown (11/09/2021)   Received from [employer], [facility]   HITS    Physically Hurt: Not on file    Insult or Talk Down To: Not on file    Threaten Physical Harm: Not on file    Scream or Curse: Not on file   Health Maintenance  Topic Date Due   PAP SMEAR-Modifier  Never done   Diabetic kidney evaluation - Urine ACR  01/31/2018   COVID-19 Vaccine (7 - 2023-24 season) 06/18/2022   OPHTHALMOLOGY EXAM  09/19/2022   INFLUENZA VACCINE  03/06/2023   Hepatitis C Screening  08/23/2023 (Originally 10/28/1983)   HIV Screening  08/23/2023 (Originally 10/29/1980)    HEMOGLOBIN A1C  04/13/2023   FOOT EXAM  07/26/2023   MAMMOGRAM  08/10/2023   Diabetic kidney evaluation - eGFR  measurement  11/28/2023   DTaP/Tdap/Td (2 - Td or Tdap) 01/03/2028   Colonoscopy  01/11/2031   Zoster Vaccines- Shingrix  Completed   HPV VACCINES  Aged Out       Review of Systems Pertinent items noted in HPI and remainder of comprehensive ROS otherwise negative.   Objective:  Blood pressure 106/71, pulse 89, height 5\' 11"  (1.803 m), weight 296 lb (134.3 kg), last menstrual period 08/24/2020.   GENERAL: Well-developed, well-nourished female in no acute distress.  HEENT: Normocephalic, atraumatic. Sclerae anicteric.  NECK: Supple. Normal thyroid.  LUNGS: Clear to auscultation bilaterally.  HEART: Regular rate and rhythm. BREASTS: Symmetric in size. No palpable masses or lymphadenopathy, skin changes, or nipple drainage. ABDOMEN: Soft, nontender, nondistended. No organomegaly. PELVIC: Normal external female genitalia. Vagina is atrophic.  Normal discharge. Normal appearing cervix. Uterus is normal in size. No adnexal mass or tenderness. Chaperone present during the pelvic exam EXTREMITIES: No cyanosis, clubbing, or edema, 2+ distal pulses.     Assessment:    Healthy female exam.      Plan:    Pap smear collected Screening mammogram ordered Pelvic ultrasound ordered to assess uterine lining and follow up on ovarian cyst Discussed benefits of endometrial biopsy to rule out malignancy. Patient desires to defer post ultrasound Patient will be contacted with abnormal results Patient current on colonoscopy Patient is current on health maintenance labs with PCP  See After Visit Summary for Counseling Recommendations

## 2023-03-10 ENCOUNTER — Other Ambulatory Visit: Payer: Self-pay

## 2023-03-10 ENCOUNTER — Other Ambulatory Visit (INDEPENDENT_AMBULATORY_CARE_PROVIDER_SITE_OTHER): Payer: Self-pay | Admitting: Family Medicine

## 2023-03-10 ENCOUNTER — Encounter: Payer: Self-pay | Admitting: Sports Medicine

## 2023-03-10 DIAGNOSIS — E538 Deficiency of other specified B group vitamins: Secondary | ICD-10-CM

## 2023-03-10 NOTE — Telephone Encounter (Signed)
Any updates on [employer] approval?

## 2023-03-11 ENCOUNTER — Other Ambulatory Visit: Payer: Self-pay

## 2023-03-11 ENCOUNTER — Other Ambulatory Visit (HOSPITAL_COMMUNITY): Payer: Self-pay

## 2023-03-11 DIAGNOSIS — F3181 Bipolar II disorder: Secondary | ICD-10-CM | POA: Diagnosis not present

## 2023-03-11 MED ORDER — FOLIC ACID 1 MG PO TABS
1.0000 mg | ORAL_TABLET | Freq: Every day | ORAL | 1 refills | Status: DC
Start: 2023-03-11 — End: 2023-09-07
  Filled 2023-03-11: qty 90, 90d supply, fill #0
  Filled 2023-06-05: qty 90, 90d supply, fill #1

## 2023-03-11 NOTE — Telephone Encounter (Signed)
[facility] faxed [place] documentation stating that they couldn't locate patient in there database or her plan has been terminated.

## 2023-03-13 ENCOUNTER — Other Ambulatory Visit (HOSPITAL_COMMUNITY): Payer: Self-pay

## 2023-03-13 ENCOUNTER — Encounter: Payer: Self-pay | Admitting: Family Medicine

## 2023-03-13 ENCOUNTER — Encounter (INDEPENDENT_AMBULATORY_CARE_PROVIDER_SITE_OTHER): Payer: Commercial Managed Care - PPO | Admitting: Sports Medicine

## 2023-03-13 DIAGNOSIS — M2550 Pain in unspecified joint: Secondary | ICD-10-CM

## 2023-03-14 ENCOUNTER — Ambulatory Visit: Payer: Commercial Managed Care - PPO | Admitting: Medical-Surgical

## 2023-03-14 ENCOUNTER — Other Ambulatory Visit (HOSPITAL_COMMUNITY): Payer: Self-pay

## 2023-03-14 ENCOUNTER — Encounter: Payer: Self-pay | Admitting: Family Medicine

## 2023-03-14 ENCOUNTER — Other Ambulatory Visit: Payer: Self-pay

## 2023-03-14 ENCOUNTER — Telehealth: Payer: Self-pay | Admitting: Adult Health

## 2023-03-14 DIAGNOSIS — G8929 Other chronic pain: Secondary | ICD-10-CM

## 2023-03-14 DIAGNOSIS — G4733 Obstructive sleep apnea (adult) (pediatric): Secondary | ICD-10-CM

## 2023-03-14 MED ORDER — ARMODAFINIL 200 MG PO TABS
200.0000 mg | ORAL_TABLET | Freq: Every day | ORAL | 0 refills | Status: DC | PRN
Start: 2023-03-14 — End: 2023-08-05
  Filled 2023-03-14: qty 90, 90d supply, fill #0

## 2023-03-14 MED ORDER — NURTEC 75 MG PO TBDP
1.0000 | ORAL_TABLET | Freq: Once | ORAL | 0 refills | Status: DC | PRN
Start: 2023-03-14 — End: 2023-04-16
  Filled 2023-03-14: qty 8, 30d supply, fill #0
  Filled 2023-04-14: qty 8, 30d supply, fill #1

## 2023-03-14 MED ORDER — PREGABALIN 75 MG PO CAPS
75.0000 mg | ORAL_CAPSULE | Freq: Three times a day (TID) | ORAL | 3 refills | Status: DC
Start: 2023-03-14 — End: 2023-08-31
  Filled 2023-03-14: qty 90, 30d supply, fill #0
  Filled 2023-04-15: qty 90, 30d supply, fill #1
  Filled 2023-06-05: qty 90, 30d supply, fill #2
  Filled 2023-07-23: qty 90, 30d supply, fill #3

## 2023-03-14 NOTE — Telephone Encounter (Signed)
Pended.

## 2023-03-14 NOTE — Telephone Encounter (Signed)
Pt lvm that she would like a 90 day supply of armodafinil 200 mg tabs. She said in the message that she is taking 1 1/2 pills. Please send script to [facility]

## 2023-03-14 NOTE — Telephone Encounter (Signed)
Called patient to verify request, as armodafinil is 200 mg once a day. She agreed. She takes 1-1/2 tabs of Risperdal.

## 2023-03-15 ENCOUNTER — Other Ambulatory Visit (HOSPITAL_COMMUNITY): Payer: Self-pay

## 2023-03-17 ENCOUNTER — Other Ambulatory Visit: Payer: Self-pay

## 2023-03-18 ENCOUNTER — Encounter: Payer: Self-pay | Admitting: Family Medicine

## 2023-03-18 ENCOUNTER — Other Ambulatory Visit: Payer: Self-pay

## 2023-03-18 NOTE — Telephone Encounter (Signed)
Patient scheduled.

## 2023-03-19 ENCOUNTER — Ambulatory Visit: Payer: Commercial Managed Care - PPO | Admitting: Family Medicine

## 2023-03-19 ENCOUNTER — Other Ambulatory Visit (HOSPITAL_COMMUNITY): Payer: Self-pay

## 2023-03-19 ENCOUNTER — Other Ambulatory Visit (HOSPITAL_BASED_OUTPATIENT_CLINIC_OR_DEPARTMENT_OTHER): Payer: Self-pay

## 2023-03-19 ENCOUNTER — Other Ambulatory Visit: Payer: Self-pay

## 2023-03-19 ENCOUNTER — Encounter: Payer: Self-pay | Admitting: Family Medicine

## 2023-03-19 VITALS — Ht 71.0 in | Wt 307.0 lb

## 2023-03-19 DIAGNOSIS — M25472 Effusion, left ankle: Secondary | ICD-10-CM

## 2023-03-19 DIAGNOSIS — E039 Hypothyroidism, unspecified: Secondary | ICD-10-CM | POA: Diagnosis not present

## 2023-03-19 DIAGNOSIS — M25542 Pain in joints of left hand: Secondary | ICD-10-CM

## 2023-03-19 DIAGNOSIS — M25475 Effusion, left foot: Secondary | ICD-10-CM

## 2023-03-19 DIAGNOSIS — M25541 Pain in joints of right hand: Secondary | ICD-10-CM

## 2023-03-19 DIAGNOSIS — M25471 Effusion, right ankle: Secondary | ICD-10-CM | POA: Diagnosis not present

## 2023-03-19 DIAGNOSIS — M1812 Unilateral primary osteoarthritis of first carpometacarpal joint, left hand: Secondary | ICD-10-CM

## 2023-03-19 DIAGNOSIS — M25474 Effusion, right foot: Secondary | ICD-10-CM | POA: Diagnosis not present

## 2023-03-19 DIAGNOSIS — E1165 Type 2 diabetes mellitus with hyperglycemia: Secondary | ICD-10-CM

## 2023-03-19 DIAGNOSIS — M79675 Pain in left toe(s): Secondary | ICD-10-CM

## 2023-03-19 MED ORDER — DEXCOM G6 SENSOR MISC
1 refills | Status: DC
  Filled 2023-03-19: qty 9, 90d supply, fill #0
  Filled 2023-06-18: qty 9, 90d supply, fill #1

## 2023-03-19 MED ORDER — DEXCOM G6 TRANSMITTER MISC
3 refills | Status: DC
Start: 2023-03-19 — End: 2024-04-13
  Filled 2023-03-19: qty 1, 90d supply, fill #0
  Filled 2023-06-18: qty 1, 90d supply, fill #1
  Filled 2023-09-24 – 2023-12-09 (×4): qty 1, 90d supply, fill #2

## 2023-03-19 MED ORDER — TIRZEPATIDE 15 MG/0.5ML ~~LOC~~ SOAJ
15.0000 mg | SUBCUTANEOUS | 1 refills | Status: DC
  Filled 2023-03-19 (×2): qty 2, 28d supply, fill #0

## 2023-03-19 MED ORDER — METHYLPREDNISOLONE 4 MG PO TBPK
ORAL_TABLET | ORAL | 0 refills | Status: DC
  Filled 2023-03-19: qty 21, 6d supply, fill #0

## 2023-03-19 MED ORDER — KETOROLAC TROMETHAMINE 30 MG/ML IJ SOLN
30.0000 mg | Freq: Once | INTRAMUSCULAR | Status: AC
Start: 2023-03-19 — End: 2023-03-19
  Administered 2023-03-19: 30 mg via INTRAMUSCULAR

## 2023-03-19 NOTE — Patient Instructions (Signed)
Start medrol dosepak.  Let me know if thumb is not improving.

## 2023-03-20 ENCOUNTER — Other Ambulatory Visit: Payer: Self-pay

## 2023-03-20 ENCOUNTER — Other Ambulatory Visit (HOSPITAL_COMMUNITY): Payer: Self-pay

## 2023-03-20 ENCOUNTER — Other Ambulatory Visit: Payer: Self-pay | Admitting: Family Medicine

## 2023-03-20 ENCOUNTER — Ambulatory Visit: Payer: Commercial Managed Care - PPO | Admitting: Neurology

## 2023-03-20 ENCOUNTER — Encounter: Payer: Self-pay | Admitting: Neurology

## 2023-03-20 VITALS — BP 121/71 | HR 76 | Ht 71.0 in | Wt 308.0 lb

## 2023-03-20 DIAGNOSIS — G43709 Chronic migraine without aura, not intractable, without status migrainosus: Secondary | ICD-10-CM

## 2023-03-20 DIAGNOSIS — E039 Hypothyroidism, unspecified: Secondary | ICD-10-CM

## 2023-03-20 LAB — URIC ACID: Uric Acid: 4.1 mg/dL (ref 3.0–7.2)

## 2023-03-20 LAB — TSH: TSH: 0.118 u[IU]/mL — ABNORMAL LOW (ref 0.450–4.500)

## 2023-03-20 LAB — C-REACTIVE PROTEIN: CRP: 3 mg/L (ref 0–10)

## 2023-03-20 MED ORDER — LEVOTHYROXINE SODIUM 200 MCG PO TABS
200.0000 ug | ORAL_TABLET | Freq: Every day | ORAL | 1 refills | Status: DC
  Filled 2023-03-20: qty 90, 90d supply, fill #0

## 2023-03-20 MED ORDER — QULIPTA 60 MG PO TABS
60.0000 mg | ORAL_TABLET | Freq: Every day | ORAL | 11 refills | Status: DC
  Filled 2023-03-20 – 2023-05-07 (×2): qty 30, 30d supply, fill #0
  Filled 2023-06-18: qty 30, 30d supply, fill #1
  Filled 2023-07-21: qty 30, 30d supply, fill #2
  Filled 2023-09-24: qty 30, 30d supply, fill #3
  Filled 2023-10-09 – 2023-10-22 (×2): qty 30, 30d supply, fill #4
  Filled 2023-12-09: qty 30, 30d supply, fill #5

## 2023-03-20 MED ORDER — SYNTHROID 200 MCG PO TABS
200.0000 ug | ORAL_TABLET | Freq: Every day | ORAL | 1 refills | Status: DC
  Filled 2023-03-20: qty 90, 90d supply, fill #0
  Filled 2023-06-18: qty 90, 90d supply, fill #1

## 2023-03-20 NOTE — Progress Notes (Signed)
Chief Complaint  Patient presents with   New Patient (Initial Visit)    Rm 14, alone  Chronic nonintractable headache: nauseated/head pressure entire head, pt stated they have back pain when they raise voice. Feels like  pressure on her throatl. Intermittent but daily worse at times. They seem worse in the evenings. Pt stated she gets dizzy and has blurred vision. Triggers: unidentifiable. She mentioned cts problems w/hand dexterity.  Polyarthralgia, gait abnormality. She mentione trigger point injection interest      ASSESSMENT AND PLAN  Tracy [name] is a 57 y.o. female   Chronic migraine  Prior authorization for Botox injection as migraine prevention  Previously tried and failed Maxalt, Nurtec, Imitrex,  Try Qulipta 60 mg daily    DIAGNOSTIC DATA (LABS, IMAGING, TESTING) - I reviewed patient records, labs, notes, testing and imaging myself where available.   MEDICAL HISTORY:  Tracy [name], 57 year old female seen in request by her primary care physician Dr. Ashley Royalty, Selena Batten, for evaluation of migraine  I reviewed and summarized the referring note. PMHX. Diabetes  Hypothyroidism, Type II bipolar disorder, GERD Obstructive sleep apnea. Lumbar and cervical decompression surgery Left hip replacement Bilateral Achilles reconstruction. Right rotator cuff repair  She had long history of migraine, often came on suddenly, lateralized severe pounding headache with light, noise sensitivity, worsening by movement, blurry vision, since January 2024, she reported increased frequency of headache, up to 4 times a week, tried Maxalt , Nurtec, over-the-counter medication, Fioricet did not help, ice pack, sleep is helpful, headache can last for hours to days,  For preventive medication, she has tried Topamax in the past with limited help, now on polypharmacy including Lyrica, Zoloft, Risperdal, lamotrigine, clonazepam,  She denies visual change, recent eye examination showed no  significant abnormality CT head was normal in Feb 2021   PHYSICAL EXAM:   Vitals:   03/20/23 1354  BP: 121/71  Pulse: 76  Weight: (!) 308 lb (139.7 kg)  Height: 5\' 11"  (1.803 m)   Body mass index is 42.96 kg/m.  PHYSICAL EXAMNIATION:  Gen: NAD, conversant, well nourised, well groomed                     Cardiovascular: Regular rate rhythm, no peripheral edema, warm, nontender. Eyes: Conjunctivae clear without exudates or hemorrhage Neck: Supple, no carotid bruits. Pulmonary: Clear to auscultation bilaterally   NEUROLOGICAL EXAM:  MENTAL STATUS: Speech/cognition: Rest looking middle-age female, obese, awake, alert, oriented to history taking and casual conversation CRANIAL NERVES: CN II: Visual fields are full to confrontation. Pupils are round equal and briskly reactive to light.  Funduscopy examination showed sharp disc bilaterally CN III, IV, VI: extraocular movement are normal. No ptosis. CN V: Facial sensation is intact to light touch CN VII: Face is symmetric with normal eye closure  CN VIII: Hearing is normal to causal conversation. CN IX, X: Phonation is normal. CN XI: Head turning and shoulder shrug are intact  MOTOR: There is no pronator drift of out-stretched arms. Muscle bulk and tone are normal. Muscle strength is normal.  REFLEXES: Reflexes are 2+ and symmetric at the biceps, triceps, knees, and ankles. Plantar responses are flexor.  SENSORY: Intact to light touch, pinprick and vibratory sensation are intact in fingers and toes.  COORDINATION: There is no trunk or limb dysmetria noted.  GAIT/STANCE: Push-up to get up from seated position, cautious  REVIEW OF SYSTEMS:  Full 14 system review of systems performed and notable only for as above All  other review of systems were negative.   ALLERGIES: Allergies  Allergen Reactions   Sulfa Antibiotics Itching, Swelling and Rash   Food     Walnut-mouth swelling/itching   Bupropion Hcl Itching and  Rash   Codeine Itching   Sulfamethoxazole-Trimethoprim Rash    HOME MEDICATIONS: Current Outpatient Medications  Medication Sig Dispense Refill   Armodafinil 200 MG TABS Take 1 tablet (200 mg total) by mouth daily as needed (sleepiness). 90 tablet 0   Blood Glucose Monitoring Suppl (FREESTYLE LITE) w/Device KIT Use as directed. 1 kit 0   Blood Glucose Monitoring Suppl (FREESTYLE LITE) w/Device KIT Check fasting blood sugar every morning and before meals up to 4 times daily. 1 kit PRN   clobetasol (TEMOVATE) 0.05 % GEL Apply small amount to affected area twice daily using finger. Gently pat dry with cotton gauze.  Do not eat,drink, or speak for 30 minutes to allow absorption. 30 g 2   clonazePAM (KLONOPIN) 1 MG tablet Take 1 tablet (1 mg total) by mouth 3 (three) times daily. 90 tablet 2   clotrimazole (MYCELEX) 10 MG troche Dissolve 1 tablet by mouth 2 times a day. Dissolve tablet slowly and completely on days you use topical steroid. 70 Troche 2   Continuous Glucose Sensor (DEXCOM G6 SENSOR) MISC Replace every 10 days.  Use to check blood sugar. 9 each 1   Continuous Glucose Transmitter (DEXCOM G6 TRANSMITTER) MISC Use to check continuous blood sugar.  Replace every 90 days 1 each 3   cyclobenzaprine (FLEXERIL) 10 MG tablet Take 1 tablet (10 mg total) by mouth 3 (three) times daily as needed for muscle spasms. 30 tablet 1   diclofenac Sodium (VOLTAREN) 1 % GEL Apply 1 Application topically 4 (four) times daily as needed (pain).     ferrous sulfate 325 (65 FE) MG EC tablet Take 1 tablet (325 mg total) by mouth in the morning and at bedtime. Take iron supplement 2 hours before or 4 hours after PPI or other antacids as this requires some level of gastric acidity to aid in absorption (Patient taking differently: Take 325 mg by mouth daily. Take iron supplement 2 hours before or 4 hours after PPI or other antacids as this requires some level of gastric acidity to aid in absorption) 60 tablet 5   folic  acid (FOLVITE) 1 MG tablet Take 1 tablet (1 mg total) by mouth daily. 90 tablet 1   furosemide (LASIX) 40 MG tablet Take 1 tablet (40 mg total) by mouth daily. 30 tablet 3   glucose blood (FREESTYLE LITE) test strip Use as instructed 100 each 12   glucose blood (FREESTYLE LITE) test strip Check fasting blood sugar every morning and before meals up to 4 times a day as directed. 100 strip 12   hydrOXYzine (ATARAX) 25 MG tablet Take 1 tablet (25 mg total) by mouth 3 (three) times daily as needed for anxiety. 90 tablet 3   insulin lispro (HUMALOG KWIKPEN) 100 UNIT/ML KwikPen Inject up to 16 units 3 times daily at mealtime per sliding scale. (Patient taking differently: Inject 3-16 Units into the skin 3 (three) times daily as needed (when taking steroids).) 15 mL 11   Insulin Pen Needle (UNIFINE PENTIPS) 31G X 5 MM MISC Use as directed to inject insulin 100 each 3   ketorolac (TORADOL) 10 MG tablet Take 1 tablet (10 mg total) by mouth every 8 (eight) hours as needed. 20 tablet 0   lamoTRIgine (LAMICTAL) 200 MG tablet Take 2 tablets  by mouth at bedtime. 180 tablet 1   Lancets 30G MISC Check fasting blood sugar every morning and before meals up to 4 times daily. 100 each 12   lidocaine (LIDODERM) 5 % Place 1 patch onto the skin every 12 (twelve) hours. Remove & Discard patch within 12 hours or as directed by MD 30 patch 0   lidocaine (XYLOCAINE) 5 % ointment Apply topically to affected areas 3 times daily as needed. 35.44 g 0   lisinopril (ZESTRIL) 10 MG tablet Take 1 tablet (10 mg total) by mouth daily. 90 tablet 3   meloxicam (MOBIC) 15 MG tablet Take 1 tablet by mouth once every morning with a meal for 2 weeks, then daily as needed for pain. 30 tablet 0   Menthol, Topical Analgesic, (BENGAY EX) Apply 1 Application topically daily as needed (pain).     methylPREDNISolone (MEDROL DOSEPAK) 4 MG TBPK tablet Taper as directed on packaging. 21 tablet 0   neomycin-bacitracin-polymyxin [phone] OINT Apply  topically 2 (two) times daily for 10 days To corners of mouth (Patient taking differently: Apply 1 Application topically 2 (two) times daily as needed (cracking/ raw skin around mouth).) 14 g 2   nystatin (MYCOSTATIN) 100000 UNIT/ML suspension Use 5ml to swish and swallow 4  times daily for 10 days 200 mL 0   nystatin (MYCOSTATIN) 100000 UNIT/ML suspension Use 5ml to swish in mouth and swallow --use 4 times daily for 10 days 180 mL 0   nystatin (NYAMYC) powder Apply 3 times daily as directed. (Patient taking differently: Apply 1 application  topically 3 (three) times daily as needed (rash).) 60 g 3   nystatin ointment (MYCOSTATIN) Apply to the skin 2 times daily to corners of mouth (Patient taking differently: Apply 1 Application topically 2 (two) times daily as needed (raw/cracking skin around the mouth).) 15 g 2   ondansetron (ZOFRAN) 4 MG tablet Take 1 tablet (4 mg total) by mouth every 8 (eight) hours as needed for nausea or vomiting. 10 tablet 0   ondansetron (ZOFRAN-ODT) 4 MG disintegrating tablet Dissolve 1 tablet (4 mg total) by mouth 2 (two) times daily as needed for nausea or vomiting. 20 tablet 1   pantoprazole (PROTONIX) 40 MG tablet Take 2 tablets (80 mg total) by mouth 2 (two) times daily. 60 tablet 3   pimecrolimus (ELIDEL) 1 % cream Apply a small amount to affected area 2 x daily. Gently pat area dry w/cotton gauze. No eating, drinking or speaking for 20 - 30 minutes to allow medicine to be absorbed. Can apply to 2x2 gauze & placed against the affected area. (Patient taking differently: Apply 1 Application topically 2 (two) times daily as needed (ulcers).) 60 g 3   pregabalin (LYRICA) 75 MG capsule Take 1 capsule (75 mg total) by mouth 3 (three) times daily. 90 capsule 3   Rimegepant Sulfate (NURTEC) 75 MG TBDP Dissolve 1 tablet (75 mg total) by mouth once as needed for up to 1 dose (migraines). 30 tablet 0   risperiDONE (RISPERDAL) 1 MG tablet Take 1.5 tablets (1.5 mg total) by mouth at  bedtime. 45 tablet 2   rosuvastatin (CRESTOR) 20 MG tablet TAKE 1 TABLET BY MOUTH DAILY. 90 tablet 3   sertraline (ZOLOFT) 100 MG tablet Take 2 tablets (200 mg total) by mouth every morning. 180 tablet 3   spironolactone (ALDACTONE) 50 MG tablet Take 1 tablet (50 mg total) by mouth 2 (two) times daily. 180 tablet 2   SYNTHROID 200 MCG tablet Take 1  tablet (200 mcg total) by mouth daily before breakfast. 90 tablet 1   tirzepatide (MOUNJARO) 15 MG/0.5ML Pen Inject 15 mg into the skin once a week. 6 mL 1   No current facility-administered medications for this visit.    PAST MEDICAL HISTORY: Past Medical History:  Diagnosis Date   Acanthosis nigricans, acquired 02/09/2015   Anxiety and depression 10/31/2014   Arthritis    back, knees, right elbow   Arthritis of left hip 06/25/2018   Bilateral swelling of feet and ankles    Bipolar disorder (HCC)    Borderline personality disorder (HCC)    Cancer (HCC)    oral lichen planus, uses mouth wash, cream   Carpal tunnel syndrome 09/06/2015   Chewing difficulty    Chronic left shoulder pain 05/17/2020   Constipation    Coronary artery disease 03/21/2021   COVID 10/08/2021   Cystic teratoma    BENIGN   Dental crowns present    Diabetes mellitus without complication (HCC)    Diabetes mellitus, type 2 (HCC) 06/10/2013   Diarrhea    Diverticulosis of colon without hemorrhage    DOE (dyspnea on exertion) 03/21/2021   Dyspepsia 12/14/2020   Essential hypertension 10/02/2007   Qualifier: Diagnosis of  By: Everardo All MD, Sean A    Facial rash 06/16/2019   Female hirsutism 02/09/2015   Food allergy    Walnuts   GAD (generalized anxiety disorder)    Gastritis and gastroduodenitis    Gastroesophageal reflux disease    Grade II internal hemorrhoids    Greater trochanteric bursitis of left hip 07/24/2018   Hemorrhoids, internal 07/18/2017   History of kidney stones    History of migraine headaches    History of posttraumatic stress disorder  (PTSD)    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/2015   Hypothyroidism    HYPOTHYROIDISM, POSTSURGICAL 10/02/2007   Qualifier: Diagnosis of  By: Everardo All MD, Sean A    IBS (irritable bowel syndrome)    Infertility associated with anovulation 02/09/2015   Insulin resistance 02/09/2015   Joint pain    Leg swelling 09/21/2020   Lichen planus    Liver cirrhosis secondary to NASH (nonalcoholic steatohepatitis) (HCC)    Lower back pain 07/18/2017   MDD (major depressive disorder) 06/06/2015   Migraine 10/02/2007   Morbid obesity with BMI of 40.0-44.9, adult (HCC) 06/23/2020   NASH (nonalcoholic steatohepatitis)    Nausea without vomiting    Nonalcoholic fatty liver disease 02/09/2015   Obstructive sleep apnea 06/27/2014   Oral mucosal lesion 06/17/2019   Osteoarthritis    Other fatigue    PCOS (polycystic ovarian syndrome)    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   Qualifier: Diagnosis of  By: Everardo All MD, Sean A    Trigeminal neuralgia    Unilateral primary osteoarthritis, left hip 02/10/2019   Vitamin D deficiency  PAST SURGICAL HISTORY: Past Surgical History:  Procedure Laterality Date   ACHILLES TENDON SURGERY Left 2007   APPENDECTOMY     CARPAL TUNNEL RELEASE Right 01/12/2016   Procedure: RIGHT CARPAL TUNNEL RELEASE;  Surgeon: Dominica Severin, MD;  Location: [facility];  Service: Orthopedics;  Laterality: Right;   COLONOSCOPY WITH PROPOFOL N/A 04/20/2014   Procedure: COLONOSCOPY WITH PROPOFOL;  Surgeon: Willis Modena, MD;  Location: [facility];  Service: Endoscopy;  Laterality: N/A;   COLONOSCOPY WITH PROPOFOL N/A 01/10/2021   Procedure: COLONOSCOPY WITH PROPOFOL;  Surgeon: Shellia Cleverly, DO;  Location: [facility];  Service: Gastroenterology;  Laterality: N/A;   ESOPHAGOGASTRODUODENOSCOPY (EGD) WITH PROPOFOL N/A 04/20/2014   Procedure: ESOPHAGOGASTRODUODENOSCOPY (EGD) WITH PROPOFOL;  Surgeon: Willis Modena, MD;  Location: [facility];  Service: Endoscopy;  Laterality: N/A;   ESOPHAGOGASTRODUODENOSCOPY (EGD) WITH PROPOFOL N/A 01/10/2021   Procedure: ESOPHAGOGASTRODUODENOSCOPY (EGD) WITH PROPOFOL;  Surgeon: Shellia Cleverly, DO;  Location: [facility];  Service: Gastroenterology;  Laterality: N/A;   EXCISION HAGLUND'S DEFORMITY WITH ACHILLES TENDON REPAIR Right 02/25/2013   Procedure: RIGHT ACHILLES DEBRIDEMENT AND RECONSTRUCTION;  HAGLUND'S EXCISION; GASTROC RECESSION AND FLEXOR HALLUCIS LONGUS TRANSFER;  Surgeon: Toni Arthurs, MD;  Location: [facility];  Service: Orthopedics;  Laterality: Right;   JOINT REPLACEMENT     hip   LUMBAR LAMINECTOMY     X 3   neck fusion     OVARIAN CYST SURGERY Left 1998   PILONIDAL CYST EXCISION  1994   RIGHT OOPHORECTOMY     SHOULDER ARTHROSCOPY WITH ROTATOR CUFF REPAIR Right 10/17/2022   Procedure: Right shoulder arthroscopy, debridement, subacromial decompression, distal clavicle resection, rotator cuff repair;  Surgeon: Francena Hanly, MD;  Location: [facility];  Service: Orthopedics;  Laterality: Right;    STERIOD INJECTION Left 01/12/2016   Procedure: STEROID INJECTION LEFT WRIST;  Surgeon: Dominica Severin, MD;  Location: [facility];  Service: Orthopedics;  Laterality: Left;   TOTAL HIP ARTHROPLASTY Left 03/19/2019   Procedure: LEFT TOTAL HIP ARTHROPLASTY ANTERIOR APPROACH;   Surgeon: Kathryne Hitch, MD;  Location: [facility];  Service: Orthopedics;  Laterality: Left;   TOTAL THYROIDECTOMY  2003   UPPER GI ENDOSCOPY  01/19/2015    FAMILY HISTORY: Family History  Problem Relation Age of Onset   Heart disease Father    Depression Father    Heart attack Father    Sudden death Father    Anxiety disorder Father    Bipolar disorder Father    Obesity Father    Cancer Brother        THROAT cancer   Depression Brother    Anxiety disorder Brother    Depression Sister    Breast cancer Mother    Hypertension Mother    Cancer Mother    Depression Mother    Sleep apnea Mother    Obesity Mother    Depression Maternal Grandmother    Stroke Maternal Grandfather    Colon cancer Brother    Pancreatic cancer Neg Hx    Stomach cancer Neg Hx    Liver disease Neg Hx     SOCIAL HISTORY: Social History   Socioeconomic History   Marital status: Divorced    Spouse name: Not on file   Number of children: 0   Years of education: 16   Highest education level: Bachelor's degree (e.g., BA, AB, BS)  Occupational History   Occupation: RN-Telephone Triage Nurse    Employer: [employer]    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   Drug use: No   Sexual activity: Not Currently    Partners: Male    Birth control/protection: Condom  Other Topics Concern   Not on file  Social History Narrative   RN - [employer]   Regular exercise: no   Caffeine use: 2 x daily   Right-handed.    Lives alone.   Social Determinants of Health   Financial Resource Strain: Medium Risk (12/22/2022)   Overall Financial Resource Strain (CARDIA)    Difficulty of Paying Living Expenses: Somewhat hard  Food Insecurity: No Food Insecurity (12/22/2022)   Hunger Vital Sign    Worried About Running Out of Food in the Last Year: Never true    Ran Out of Food in the Last Year: Never true  Transportation Needs: No Transportation Needs (12/22/2022)   PRAPARE - Administrator, Civil Service (Medical): No    Lack of Transportation (Non-Medical): No  Physical Activity: Unknown (12/22/2022)   Exercise Vital Sign    Days of Exercise per Week: 0 days    Minutes of Exercise per Session: Not on file  Stress: Stress Concern Present (12/22/2022)   [employer] of Occupational Health - Occupational Stress Questionnaire    Feeling of Stress : Very much  Social Connections: Socially Isolated (12/22/2022)   Social Connection and Isolation Panel [NHANES]    Frequency of Communication with Friends and Family: Three times a week    Frequency of Social Gatherings with Friends and Family: Never    Attends Religious Services: Never    Database administrator or Organizations: No    Attends Engineer, structural: Not on file    Marital Status: Divorced  Intimate Partner Violence: Unknown (11/09/2021)   Received from [employer], [facility]   HITS    Physically Hurt: Not on file    Insult or Talk Down To: Not on file    Threaten Physical Harm: Not on file    Scream or Curse: Not on file      Levert Feinstein, M.D. Ph.D.  [facility] [address], [address] [place], [place] [address] Ph: [phone] Fax: [phone]  CC:  Charlton Amor, DO [address] [place],  [place] [address]  Everrett Coombe, DO

## 2023-03-22 ENCOUNTER — Encounter: Payer: Self-pay | Admitting: Family Medicine

## 2023-03-22 DIAGNOSIS — M1812 Unilateral primary osteoarthritis of first carpometacarpal joint, left hand: Secondary | ICD-10-CM | POA: Insufficient documentation

## 2023-03-22 DIAGNOSIS — M79676 Pain in unspecified toe(s): Secondary | ICD-10-CM | POA: Insufficient documentation

## 2023-03-22 NOTE — Progress Notes (Signed)
Tracy [name] - 57 y.o. female MRN [id number]  Date of birth: 1966-01-03  Subjective Chief Complaint  Patient presents with   Hand Pain   Foot Pain   Leg Swelling    HPI Tracy [name] is a 57 year old female here today with complaint of left thumb pain.  She also has complaint of foot, ankle, and leg pain with some swelling.  She has had this for few days.  Describes triggering sensation of the left thumb with pain at the base of the thumb.  Feels like this is a little swollen.  Her left foot and ankle are painful with some swelling in her toe.  She has tried over-the-counter remedies without much relief.  She is not currently using compression stockings to help with the swelling.  TSH levels previously were abnormal she has not returned to the lab to have these rechecked.  She does need a renewal of her Mounjaro.  Overall she is doing pretty well with this.  ROS:  A comprehensive ROS was completed and negative except as noted per HPI   Allergies  Allergen Reactions   Sulfa Antibiotics Itching, Swelling and Rash   Food     Walnut-mouth swelling/itching   Bupropion Hcl Itching and Rash   Codeine Itching   Sulfamethoxazole-Trimethoprim Rash    Past Medical History:  Diagnosis Date   Acanthosis nigricans, acquired 02/09/2015   Anxiety and depression 10/31/2014   Arthritis    back, knees, right elbow   Arthritis of left hip 06/25/2018   Bilateral swelling of feet and ankles    Bipolar disorder (HCC)    Borderline personality disorder (HCC)    Cancer (HCC)    oral lichen planus, uses mouth wash, cream   Carpal tunnel syndrome 09/06/2015   Chewing difficulty    Chronic left shoulder pain 05/17/2020   Constipation    Coronary artery disease 03/21/2021   COVID 10/08/2021   Cystic teratoma    BENIGN   Dental crowns present    Diabetes mellitus without complication (HCC)    Diabetes mellitus, type 2 (HCC) 06/10/2013   Diarrhea    Diverticulosis of colon without  hemorrhage    DOE (dyspnea on exertion) 03/21/2021   Dyspepsia 12/14/2020   Essential hypertension 10/02/2007   Qualifier: Diagnosis of  By: Everardo All MD, Sean A    Facial rash 06/16/2019   Female hirsutism 02/09/2015   Food allergy    Walnuts   GAD (generalized anxiety disorder)    Gastritis and gastroduodenitis    Gastroesophageal reflux disease    Grade II internal hemorrhoids    Greater trochanteric bursitis of left hip 07/24/2018   Hemorrhoids, internal 07/18/2017   History of kidney stones    History of migraine headaches    History of posttraumatic stress disorder (PTSD)    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/2015   Hypothyroidism    HYPOTHYROIDISM, POSTSURGICAL 10/02/2007   Qualifier: Diagnosis of  By: Everardo All MD, Sean A    IBS (irritable bowel syndrome)    Infertility associated with anovulation 02/09/2015   Insulin resistance 02/09/2015   Joint pain    Leg swelling 09/21/2020   Lichen planus    Liver cirrhosis secondary to NASH (nonalcoholic steatohepatitis) (HCC)    Lower back pain 07/18/2017   MDD (major depressive disorder) 06/06/2015   Migraine 10/02/2007   Morbid obesity with BMI of 40.0-44.9, adult (HCC) 06/23/2020   NASH (nonalcoholic steatohepatitis)    Nausea without vomiting    Nonalcoholic fatty liver disease 02/09/2015  Obstructive sleep apnea 06/27/2014   Oral mucosal lesion 06/17/2019   Osteoarthritis    Other fatigue    PCOS (polycystic ovarian syndrome)    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   TOBACCO USE, QUIT 02/08/2010   Qualifier: Diagnosis of  By: Everardo All MD, Sean A    Treatment-resistant depression 10/02/2007   Qualifier: Diagnosis of  By: Everardo All MD, Sean A    Trigeminal neuralgia    Unilateral primary osteoarthritis, left hip 02/10/2019   Vitamin D deficiency     Past Surgical History:  Procedure Laterality Date   ACHILLES TENDON SURGERY Left 2007   APPENDECTOMY     CARPAL TUNNEL RELEASE Right 01/12/2016   Procedure: RIGHT CARPAL TUNNEL RELEASE;  Surgeon: Dominica Severin, MD;  Location: [facility];  Service: Orthopedics;  Laterality: Right;   COLONOSCOPY WITH PROPOFOL N/A 04/20/2014   Procedure: COLONOSCOPY WITH PROPOFOL;  Surgeon: Willis Modena, MD;  Location: [facility];  Service: Endoscopy;  Laterality: N/A;   COLONOSCOPY WITH PROPOFOL N/A 01/10/2021   Procedure: COLONOSCOPY WITH PROPOFOL;  Surgeon: Shellia Cleverly, DO;  Location: [facility];  Service: Gastroenterology;  Laterality: N/A;   ESOPHAGOGASTRODUODENOSCOPY (EGD) WITH PROPOFOL N/A 04/20/2014   Procedure: ESOPHAGOGASTRODUODENOSCOPY (EGD) WITH PROPOFOL;  Surgeon: Willis Modena, MD;  Location: [facility];  Service: Endoscopy;  Laterality: N/A;   ESOPHAGOGASTRODUODENOSCOPY (EGD) WITH PROPOFOL N/A 01/10/2021   Procedure: ESOPHAGOGASTRODUODENOSCOPY (EGD) WITH PROPOFOL;  Surgeon: Shellia Cleverly, DO;  Location: [facility];  Service: Gastroenterology;  Laterality: N/A;   EXCISION HAGLUND'S DEFORMITY WITH ACHILLES TENDON REPAIR Right 02/25/2013   Procedure: RIGHT ACHILLES DEBRIDEMENT AND RECONSTRUCTION;  HAGLUND'S EXCISION; GASTROC RECESSION AND FLEXOR HALLUCIS LONGUS TRANSFER;  Surgeon: Toni Arthurs, MD;  Location: [facility];  Service: Orthopedics;  Laterality: Right;   JOINT REPLACEMENT     hip   LUMBAR LAMINECTOMY     X 3   neck fusion     OVARIAN CYST SURGERY Left 1998   PILONIDAL CYST EXCISION  1994   RIGHT OOPHORECTOMY     SHOULDER ARTHROSCOPY WITH  ROTATOR CUFF REPAIR Right 10/17/2022   Procedure: Right shoulder arthroscopy, debridement, subacromial decompression, distal clavicle resection, rotator cuff repair;  Surgeon: Francena Hanly, MD;  Location: [facility];  Service: Orthopedics;  Laterality: Right;    STERIOD INJECTION Left 01/12/2016   Procedure: STEROID INJECTION LEFT WRIST;  Surgeon: Dominica Severin, MD;  Location: [facility];  Service: Orthopedics;  Laterality: Left;   TOTAL HIP ARTHROPLASTY Left 03/19/2019   Procedure: LEFT TOTAL HIP ARTHROPLASTY ANTERIOR APPROACH;  Surgeon: Kathryne Hitch, MD;  Location: [facility];  Service: Orthopedics;  Laterality: Left;   TOTAL THYROIDECTOMY  2003   UPPER GI ENDOSCOPY  01/19/2015    Social History   Socioeconomic History   Marital status: Divorced    Spouse name: Not on file   Number of children: 0   Years of education: 16   Highest education level: Bachelor's  degree (e.g., BA, AB, BS)  Occupational History   Occupation: RN-Telephone Triage Nurse    Employer: [employer]    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   Drug use: No   Sexual activity: Not Currently    Partners: Male    Birth control/protection: Condom  Other Topics Concern   Not on file  Social History Narrative   RN - [place]   Regular exercise: no   Caffeine use: 2 x daily   Right-handed.   Lives alone.   Social Determinants of Health   Financial Resource Strain: Medium Risk (12/22/2022)   Overall  Financial Resource Strain (CARDIA)    Difficulty of Paying Living Expenses: Somewhat hard  Food Insecurity: No Food Insecurity (12/22/2022)   Hunger Vital Sign    Worried About Running Out of Food in the Last Year: Never true    Ran Out of Food in the Last Year: Never true  Transportation Needs: No Transportation Needs (12/22/2022)   PRAPARE - Administrator, Civil Service (Medical): No    Lack of Transportation (Non-Medical): No  Physical Activity: Unknown (12/22/2022)   Exercise Vital Sign    Days of Exercise per Week: 0 days    Minutes of Exercise per Session: Not on file  Stress: Stress Concern Present (12/22/2022)   [employer] of Occupational Health - Occupational Stress Questionnaire    Feeling of Stress : Very much  Social Connections: Socially Isolated (12/22/2022)   Social Connection and Isolation Panel [NHANES]    Frequency of Communication with Friends and Family: Three times a week    Frequency of Social Gatherings with Friends and Family: Never    Attends Religious Services: Never    Database administrator or Organizations: No    Attends Engineer, structural: Not on file    Marital Status: Divorced    Family History  Problem Relation Age of Onset   Heart disease Father    Depression Father    Heart attack Father    Sudden death Father    Anxiety disorder Father    Bipolar disorder Father    Obesity Father    Cancer Brother        THROAT cancer   Depression Brother    Anxiety disorder Brother    Depression Sister    Breast cancer Mother    Hypertension Mother    Cancer Mother    Depression Mother    Sleep apnea Mother    Obesity Mother    Depression Maternal Grandmother    Stroke Maternal Grandfather    Colon cancer Brother    Pancreatic cancer Neg Hx    Stomach cancer Neg Hx    Liver disease Neg Hx     Health Maintenance  Topic Date Due   Diabetic kidney evaluation - Urine ACR  01/31/2018   COVID-19 Vaccine (7 - 2023-24  season)  06/18/2022   OPHTHALMOLOGY EXAM  09/19/2022   INFLUENZA VACCINE  03/06/2023   Hepatitis C Screening  08/23/2023 (Originally 10/28/1985)   HIV Screening  08/23/2023 (Originally 10/29/1982)   HEMOGLOBIN A1C  04/13/2023   FOOT EXAM  07/26/2023   MAMMOGRAM  08/10/2023   Diabetic kidney evaluation - eGFR measurement  11/28/2023   PAP SMEAR-Modifier  03/05/2026   DTaP/Tdap/Td (2 - Td or Tdap) 01/03/2028   Colonoscopy  01/11/2031   Zoster Vaccines- Shingrix  Completed   HPV VACCINES  Aged Out     ----------------------------------------------------------------------------------------------------------------------------------------------------------------------------------------------------------------- Physical Exam Ht 5\' 11"  (1.803 m)   Wt (!) 307 lb (139.3 kg)   LMP 08/24/2020   SpO2 98%   BMI 42.82 kg/m   Physical Exam Constitutional:      Appearance: Normal appearance.  HENT:     Head: Normocephalic and atraumatic.  Musculoskeletal:     Comments: She has a skin tenderness at the base of the left thumb.  Pain with movement in all directions.  Trace bilateral lower extremity edema.  She does have painful great toe erythema.  With mild erythema of the MTP joint.  Neurological:     Mental Status: She is alert.  Psychiatric:        Mood and Affect: Mood normal.        Behavior: Behavior normal.     ------------------------------------------------------------------------------------------------------------------------------------------------------------------------------------------------------------------- Assessment and Plan  Hypothyroidism She admits to not taking her thyroid medication on a regular basis previously.  Update TSH levels.  Diabetes mellitus, type 2 Lab Results  Component Value Date   HGBA1C 6.5 (H) 10/11/2022  Blood sugars much better controlled with Mounjaro.  She will continue this at 15 mg.  Dexcom CGM renewed.  Osteoarthritis of thumb,  left She has significant tenderness along the base of the left thumb.  Mild triggering but unable to fully tolerate exam due to pain.  Given Toradol injection 30 mg with Medrol Dosepak taper.  Bilateral swelling of feet and ankles Likely multifactorial.  Major contributor is likely obesity and venous insufficiency.  Will make sure TSH is within normal limits.  Encouraged this.  Toe pain This has improved some.  Checking uric acid.  Hopefully she will get some improvement with Medrol taper.   Meds ordered this encounter  Medications   Continuous Glucose Sensor (DEXCOM G6 SENSOR) MISC    Sig: Replace every 10 days.  Use to check blood sugar.    Dispense:  9 each    Refill:  1   Continuous Glucose Transmitter (DEXCOM G6 TRANSMITTER) MISC    Sig: Use to check continuous blood sugar.  Replace every 90 days    Dispense:  1 each    Refill:  3   tirzepatide (MOUNJARO) 15 MG/0.5ML Pen    Sig: Inject 15 mg into the skin once a week.    Dispense:  6 mL    Refill:  1   methylPREDNISolone (MEDROL DOSEPAK) 4 MG TBPK tablet    Sig: Taper as directed on packaging.    Dispense:  21 tablet    Refill:  0   ketorolac (TORADOL) 30 MG/ML injection 30 mg    No follow-ups on file.    This visit occurred during the SARS-CoV-2 public health emergency.  Safety protocols were in place, including screening questions prior to the visit, additional usage of staff PPE, and extensive cleaning of exam room while observing appropriate contact time as indicated for disinfecting solutions.

## 2023-03-22 NOTE — Assessment & Plan Note (Signed)
This has improved some.  Checking uric acid.  Hopefully she will get some improvement with Medrol taper.

## 2023-03-22 NOTE — Assessment & Plan Note (Signed)
She has significant tenderness along the base of the left thumb.  Mild triggering but unable to fully tolerate exam due to pain.  Given Toradol injection 30 mg with Medrol Dosepak taper.

## 2023-03-22 NOTE — Assessment & Plan Note (Signed)
She admits to not taking her thyroid medication on a regular basis previously.  Update TSH levels.

## 2023-03-22 NOTE — Assessment & Plan Note (Signed)
Likely multifactorial.  Major contributor is likely obesity and venous insufficiency.  Will make sure TSH is within normal limits.  Encouraged this.

## 2023-03-22 NOTE — Assessment & Plan Note (Addendum)
Lab Results  Component Value Date   HGBA1C 6.5 (H) 10/11/2022  Blood sugars much better controlled with Mounjaro.  She will continue this at 15 mg.  Dexcom CGM renewed.

## 2023-03-24 ENCOUNTER — Telehealth: Payer: Self-pay

## 2023-03-24 NOTE — Telephone Encounter (Signed)
Initiated benefit verification, will update once results are received.  [id number]

## 2023-03-24 NOTE — Telephone Encounter (Signed)
Needs Botox auth. New start.   Chronic Migraine CPT 64615    Botox J0585 Units:200   G43.711 Chronic Migraine without aura, intractable, with status migrainous

## 2023-03-26 DIAGNOSIS — F3181 Bipolar II disorder: Secondary | ICD-10-CM | POA: Diagnosis not present

## 2023-03-26 NOTE — Telephone Encounter (Signed)
Submitted auth via cmm, status is pending. Key: [id number]

## 2023-03-28 ENCOUNTER — Encounter: Payer: Self-pay | Admitting: Family Medicine

## 2023-03-28 ENCOUNTER — Other Ambulatory Visit (HOSPITAL_COMMUNITY): Payer: Self-pay

## 2023-03-28 ENCOUNTER — Other Ambulatory Visit: Payer: Self-pay

## 2023-03-28 ENCOUNTER — Ambulatory Visit (INDEPENDENT_AMBULATORY_CARE_PROVIDER_SITE_OTHER): Payer: Commercial Managed Care - PPO | Admitting: Family Medicine

## 2023-03-28 VITALS — BP 97/62 | HR 69 | Ht 71.0 in | Wt 300.5 lb

## 2023-03-28 DIAGNOSIS — E1169 Type 2 diabetes mellitus with other specified complication: Secondary | ICD-10-CM | POA: Diagnosis not present

## 2023-03-28 DIAGNOSIS — Z23 Encounter for immunization: Secondary | ICD-10-CM | POA: Diagnosis not present

## 2023-03-28 DIAGNOSIS — Z7985 Long-term (current) use of injectable non-insulin antidiabetic drugs: Secondary | ICD-10-CM | POA: Diagnosis not present

## 2023-03-28 DIAGNOSIS — Z1159 Encounter for screening for other viral diseases: Secondary | ICD-10-CM | POA: Insufficient documentation

## 2023-03-28 DIAGNOSIS — Z0000 Encounter for general adult medical examination without abnormal findings: Secondary | ICD-10-CM | POA: Diagnosis not present

## 2023-03-28 DIAGNOSIS — L0292 Furuncle, unspecified: Secondary | ICD-10-CM | POA: Diagnosis not present

## 2023-03-28 MED ORDER — DOXYCYCLINE HYCLATE 100 MG PO TABS: 100.0000 mg | ORAL_TABLET | Freq: Two times a day (BID) | ORAL | 0 refills | Status: DC

## 2023-03-28 MED ORDER — DOXYCYCLINE HYCLATE 100 MG PO TABS
100.0000 mg | ORAL_TABLET | Freq: Two times a day (BID) | ORAL | 0 refills | Status: DC
  Filled 2023-03-28: qty 20, 10d supply, fill #0

## 2023-03-28 NOTE — Assessment & Plan Note (Signed)
Draining spontaneously, some surrounding erythema.  Adding course of doxycycline.

## 2023-03-28 NOTE — Progress Notes (Signed)
[name] - 57 y.o. female MRN [id number]  Date of birth: 1966-03-10  Subjective Chief Complaint  Patient presents with   Annual Exam    HPI Tracy [name] is a 57 y.o. female here today for annual exam.   She reports that she is doing ok.  She has a boil on the inside of her thigh.  Draining some purulent material.  No fever or chills.   She is fairly sedentary.  Diet has improved some.   She is a former smoker.  Denies EtOH use.   Review of Systems  Constitutional:  Negative for chills, fever, malaise/fatigue and weight loss.  HENT:  Negative for congestion, ear pain and sore throat.   Eyes:  Negative for blurred vision, double vision and pain.  Respiratory:  Negative for cough and shortness of breath.   Cardiovascular:  Negative for chest pain and palpitations.  Gastrointestinal:  Negative for abdominal pain, blood in stool, constipation, heartburn and nausea.  Genitourinary:  Negative for dysuria and urgency.  Musculoskeletal:  Negative for joint pain and myalgias.  Neurological:  Negative for dizziness and headaches.  Endo/Heme/Allergies:  Does not bruise/bleed easily.  Psychiatric/Behavioral:  Negative for depression. The patient is not nervous/anxious and does not have insomnia.     Allergies  Allergen Reactions   Sulfa Antibiotics Itching, Swelling and Rash   Food     Walnut-mouth swelling/itching   Bupropion Hcl Itching and Rash   Codeine Itching   Sulfamethoxazole-Trimethoprim Rash    Past Medical History:  Diagnosis Date   Acanthosis nigricans, acquired 02/09/2015   Anxiety and depression 10/31/2014   Arthritis    back, knees, right elbow   Arthritis of left hip 06/25/2018   Bilateral swelling of feet and ankles    Bipolar disorder (HCC)    Borderline personality disorder (HCC)    Cancer (HCC)    oral lichen planus, uses mouth wash, cream   Carpal tunnel syndrome 09/06/2015   Chewing difficulty    Chronic left shoulder pain 05/17/2020    Constipation    Coronary artery disease 03/21/2021   COVID 10/08/2021   Cystic teratoma    BENIGN   Dental crowns present    Diabetes mellitus without complication (HCC)    Diabetes mellitus, type 2 (HCC) 06/10/2013   Diarrhea    Diverticulosis of colon without hemorrhage    DOE (dyspnea on exertion) 03/21/2021   Dyspepsia 12/14/2020   Essential hypertension 10/02/2007   Qualifier: Diagnosis of  By: Everardo All MD, Sean A    Facial rash 06/16/2019   Female hirsutism 02/09/2015   Food allergy    Walnuts   GAD (generalized anxiety disorder)    Gastritis and gastroduodenitis    Gastroesophageal reflux disease    Grade II internal hemorrhoids    Greater trochanteric bursitis of left hip 07/24/2018   Hemorrhoids, internal 07/18/2017   History of kidney stones    History of migraine headaches    History of posttraumatic stress disorder (PTSD)    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/2015   Hypothyroidism    HYPOTHYROIDISM, POSTSURGICAL 10/02/2007   Qualifier: Diagnosis of  By: Everardo All MD, Sean A    IBS (irritable bowel syndrome)    Infertility associated with anovulation 02/09/2015   Insulin resistance 02/09/2015   Joint pain    Leg swelling 09/21/2020   Lichen planus    Liver cirrhosis secondary to NASH (nonalcoholic steatohepatitis) (HCC)    Lower back pain 07/18/2017   MDD (major depressive disorder) 06/06/2015   Migraine  10/02/2007   Morbid obesity with BMI of 40.0-44.9, adult (HCC) 06/23/2020   NASH (nonalcoholic steatohepatitis)    Nausea without vomiting    Nonalcoholic fatty liver disease 02/09/2015   Obstructive sleep apnea 06/27/2014   Oral mucosal lesion 06/17/2019   Osteoarthritis    Other fatigue    PCOS (polycystic ovarian syndrome)    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   Qualifier: Diagnosis of  By: Everardo All MD, Sean A    Trigeminal neuralgia    Unilateral primary osteoarthritis, left hip 02/10/2019   Vitamin D deficiency     Past Surgical History:  Procedure Laterality Date   ACHILLES TENDON SURGERY Left 2007   APPENDECTOMY     CARPAL TUNNEL RELEASE Right 01/12/2016   Procedure: RIGHT CARPAL TUNNEL RELEASE;  Surgeon: Dominica Severin, MD;  Location: [facility];  Service: Orthopedics;  Laterality: Right;   COLONOSCOPY WITH PROPOFOL N/A 04/20/2014   Procedure: COLONOSCOPY WITH PROPOFOL;  Surgeon: Willis Modena, MD;  Location: [facility];  Service: Endoscopy;  Laterality: N/A;   COLONOSCOPY WITH PROPOFOL N/A 01/10/2021   Procedure: COLONOSCOPY WITH PROPOFOL;  Surgeon: Shellia Cleverly, DO;  Location: [facility];  Service: Gastroenterology;  Laterality: N/A;   ESOPHAGOGASTRODUODENOSCOPY (EGD) WITH PROPOFOL N/A 04/20/2014   Procedure: ESOPHAGOGASTRODUODENOSCOPY (EGD) WITH PROPOFOL;  Surgeon: Willis Modena, MD;  Location: [facility];  Service: Endoscopy;  Laterality: N/A;   ESOPHAGOGASTRODUODENOSCOPY (EGD) WITH PROPOFOL N/A 01/10/2021   Procedure: ESOPHAGOGASTRODUODENOSCOPY (EGD) WITH PROPOFOL;  Surgeon: Shellia Cleverly, DO;  Location: [facility];  Service: Gastroenterology;  Laterality: N/A;   EXCISION HAGLUND'S DEFORMITY WITH ACHILLES TENDON REPAIR Right 02/25/2013   Procedure: RIGHT ACHILLES DEBRIDEMENT AND RECONSTRUCTION;  HAGLUND'S EXCISION; GASTROC RECESSION AND FLEXOR HALLUCIS LONGUS TRANSFER;  Surgeon: Toni Arthurs, MD;   Location: [facility];  Service: Orthopedics;  Laterality: Right;   JOINT REPLACEMENT     hip   LUMBAR LAMINECTOMY     X 3   neck fusion     OVARIAN CYST SURGERY Left 1998   PILONIDAL CYST EXCISION  1994   RIGHT OOPHORECTOMY     SHOULDER ARTHROSCOPY WITH ROTATOR CUFF REPAIR Right 10/17/2022   Procedure: Right shoulder arthroscopy, debridement, subacromial decompression, distal clavicle resection, rotator cuff repair;  Surgeon: Francena Hanly, MD;  Location: [facility];  Service: Orthopedics;  Laterality: Right;    STERIOD INJECTION Left 01/12/2016   Procedure: STEROID INJECTION LEFT WRIST;  Surgeon: Dominica Severin, MD;  Location: [facility];  Service: Orthopedics;  Laterality: Left;   TOTAL HIP ARTHROPLASTY Left 03/19/2019   Procedure: LEFT TOTAL HIP ARTHROPLASTY ANTERIOR APPROACH;  Surgeon: Kathryne Hitch, MD;  Location: [facility];  Service: Orthopedics;  Laterality: Left;   TOTAL THYROIDECTOMY  2003   UPPER GI ENDOSCOPY  01/19/2015    Social History  Socioeconomic History   Marital status: Divorced    Spouse name: Not on file   Number of children: 0   Years of education: 39   Highest education level: Bachelor's degree (e.g., BA, AB, BS)  Occupational History   Occupation: RN-Telephone Triage Nurse    Employer: [employer]    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   Drug use: No   Sexual activity: Not Currently    Partners: Male    Birth control/protection:  Condom  Other Topics Concern   Not on file  Social History Narrative   RN - [employer]   Regular exercise: no   Caffeine use: 2 x daily   Right-handed.   Lives alone.   Social Determinants of Health   Financial Resource Strain: Medium Risk (12/22/2022)   Overall Financial Resource Strain (CARDIA)    Difficulty of Paying Living Expenses: Somewhat hard  Food Insecurity: No Food Insecurity (12/22/2022)   Hunger Vital Sign    Worried About Running Out of Food in the Last Year: Never true    Ran Out of Food in the Last Year: Never true  Transportation Needs: No Transportation Needs (12/22/2022)   PRAPARE - Administrator, Civil Service (Medical): No    Lack of Transportation (Non-Medical): No  Physical Activity: Unknown (12/22/2022)   Exercise Vital Sign    Days of Exercise per Week: 0 days    Minutes of Exercise per Session: Not on file  Stress: Stress Concern Present (12/22/2022)   [employer] of Occupational Health - Occupational Stress Questionnaire    Feeling of Stress : Very much  Social Connections: Socially Isolated (12/22/2022)   Social Connection and Isolation Panel [NHANES]    Frequency of Communication with Friends and Family: Three times a week    Frequency of Social Gatherings with Friends and Family: Never    Attends Religious Services: Never    Database administrator or Organizations: No    Attends Engineer, structural: Not on file    Marital Status: Divorced    Family History  Problem Relation Age of Onset   Heart disease Father    Depression Father    Heart attack Father    Sudden death Father    Anxiety disorder Father    Bipolar disorder Father    Obesity Father    Cancer Brother        THROAT cancer   Depression Brother    Anxiety disorder Brother    Depression Sister    Breast cancer Mother    Hypertension Mother    Cancer Mother    Depression Mother    Sleep apnea Mother    Obesity Mother    Depression Maternal  Grandmother    Stroke Maternal Grandfather    Colon cancer Brother    Pancreatic cancer Neg Hx    Stomach cancer Neg Hx  Liver disease Neg Hx     Health Maintenance  Topic Date Due   Diabetic kidney evaluation - Urine ACR  01/31/2018   COVID-19 Vaccine (7 - 2023-24 season) 06/18/2022   OPHTHALMOLOGY EXAM  09/19/2022   INFLUENZA VACCINE  03/06/2023   Hepatitis C Screening  08/23/2023 (Originally 10[birth year]28/1985)   HIV Screening  08/23/2023 (Originally 10/29/1982)   HEMOGLOBIN A1C  04/13/2023   FOOT EXAM  07/26/2023   MAMMOGRAM  08/10/2023   Diabetic kidney evaluation - eGFR measurement  11/28/2023   PAP SMEAR-Modifier  03/05/2026   DTaP/Tdap/Td (2 - Td or Tdap) 01/03/2028   Colonoscopy  01/11/2031   Zoster Vaccines- Shingrix  Completed   HPV VACCINES  Aged Out     ----------------------------------------------------------------------------------------------------------------------------------------------------------------------------------------------------------------- Physical Exam BP 97/62   Pulse 69   Ht 5\' 11"  (1.803 m)   Wt (!) 300 lb 8 oz (136.3 kg)   LMP 08/24/2020   SpO2 98%   BMI 41.91 kg/m   Physical Exam Constitutional:      General: She is not in acute distress. HENT:     Head: Normocephalic and atraumatic.     Right Ear: Tympanic membrane and ear canal normal.     Left Ear: Tympanic membrane and ear canal normal.     Nose: Nose normal.  Eyes:     General: No scleral icterus.    Conjunctiva/sclera: Conjunctivae normal.  Neck:     Thyroid: No thyromegaly.  Cardiovascular:     Rate and Rhythm: Normal rate and regular rhythm.     Heart sounds: Normal heart sounds.  Pulmonary:     Effort: Pulmonary effort is normal.     Breath sounds: Normal breath sounds.  Abdominal:     General: Bowel sounds are normal. There is no distension.     Palpations: Abdomen is soft.     Tenderness: There is no abdominal tenderness. There is no guarding.  Musculoskeletal:         General: Normal range of motion.     Cervical back: Normal range of motion and neck supple.  Lymphadenopathy:     Cervical: No cervical adenopathy.  Skin:    General: Skin is warm and dry.     Findings: No rash.  Neurological:     General: No focal deficit present.     Mental Status: She is alert and oriented to person, place, and time.     Cranial Nerves: No cranial nerve deficit.     Coordination: Coordination normal.  Psychiatric:        Mood and Affect: Mood normal.        Behavior: Behavior normal.     ------------------------------------------------------------------------------------------------------------------------------------------------------------------------------------------------------------------- Assessment and Plan  Well adult exam Well adult Orders Placed This Encounter  Procedures   CMP14+EGFR   CBC with Differential   Lipid Panel With LDL/HDL Ratio   HgB A1c   Urine Microalbumin w/creat. ratio  Screenings: per lab orders Immunization:  Flu vaccine given today.  Anticipatory guidance/Risk factor reduction:  Recommendations for AVS.   Boil Draining spontaneously, some surrounding erythema.  Adding course of doxycycline.    Meds ordered this encounter  Medications   doxycycline (VIBRA-TABS) 100 MG tablet    Sig: Take 1 tablet (100 mg total) by mouth 2 (two) times daily.    Dispense:  20 tablet    Refill:  0    Return in about 6 months (around 09/28/2023) for T2DM.    This visit occurred during the SARS-CoV-2 public health emergency.  Safety protocols were in place, including screening questions prior to the visit, additional usage of staff PPE, and extensive cleaning of exam room while observing appropriate contact time as indicated for disinfecting solutions.

## 2023-03-28 NOTE — Patient Instructions (Signed)
Preventive Care 40-55 Years Old, Female Preventive care refers to lifestyle choices and visits with your health care provider that can promote health and wellness. Preventive care visits are also called wellness exams. What can I expect for my preventive care visit? Counseling Your health care provider may ask you questions about your: Medical history, including: Past medical problems. Family medical history. Pregnancy history. Current health, including: Menstrual cycle. Method of birth control. Emotional well-being. Home life and relationship well-being. Sexual activity and sexual health. Lifestyle, including: Alcohol, nicotine or tobacco, and drug use. Access to firearms. Diet, exercise, and sleep habits. Work and work environment. Sunscreen use. Safety issues such as seatbelt and bike helmet use. Physical exam Your health care provider will check your: Height and weight. These may be used to calculate your BMI (body mass index). BMI is a measurement that tells if you are at a healthy weight. Waist circumference. This measures the distance around your waistline. This measurement also tells if you are at a healthy weight and may help predict your risk of certain diseases, such as type 2 diabetes and high blood pressure. Heart rate and blood pressure. Body temperature. Skin for abnormal spots. What immunizations do I need?  Vaccines are usually given at various ages, according to a schedule. Your health care provider will recommend vaccines for you based on your age, medical history, and lifestyle or other factors, such as travel or where you work. What tests do I need? Screening Your health care provider may recommend screening tests for certain conditions. This may include: Lipid and cholesterol levels. Diabetes screening. This is done by checking your blood sugar (glucose) after you have not eaten for a while (fasting). Pelvic exam and Pap test. Hepatitis B test. Hepatitis C  test. HIV (human immunodeficiency virus) test. STI (sexually transmitted infection) testing, if you are at risk. Lung cancer screening. Colorectal cancer screening. Mammogram. Talk with your health care provider about when you should start having regular mammograms. This may depend on whether you have a family history of breast cancer. BRCA-related cancer screening. This may be done if you have a family history of breast, ovarian, tubal, or peritoneal cancers. Bone density scan. This is done to screen for osteoporosis. Talk with your health care provider about your test results, treatment options, and if necessary, the need for more tests. Follow these instructions at home: Eating and drinking  Eat a diet that includes fresh fruits and vegetables, whole grains, lean protein, and low-fat dairy products. Take vitamin and mineral supplements as recommended by your health care provider. Do not drink alcohol if: Your health care provider tells you not to drink. You are pregnant, may be pregnant, or are planning to become pregnant. If you drink alcohol: Limit how much you have to 0-1 drink a day. Know how much alcohol is in your drink. In the [place] one drink equals one 12 oz bottle of beer (355 mL), one 5 oz glass of wine (148 mL), or one 1 oz glass of hard liquor (44 mL). Lifestyle Brush your teeth every morning and night with fluoride toothpaste. Floss one time each day. Exercise for at least 30 minutes 5 or more days each week. Do not use any products that contain nicotine or tobacco. These products include cigarettes, chewing tobacco, and vaping devices, such as e-cigarettes. If you need help quitting, ask your health care provider. Do not use drugs. If you are sexually active, practice safe sex. Use a condom or other form of protection to   prevent STIs. If you do not wish to become pregnant, use a form of birth control. If you plan to become pregnant, see your health care provider for a  prepregnancy visit. Take aspirin only as told by your health care provider. Make sure that you understand how much to take and what form to take. Work with your health care provider to find out whether it is safe and beneficial for you to take aspirin daily. Find healthy ways to manage stress, such as: Meditation, yoga, or listening to music. Journaling. Talking to a trusted person. Spending time with friends and family. Minimize exposure to UV radiation to reduce your risk of skin cancer. Safety Always wear your seat belt while driving or riding in a vehicle. Do not drive: If you have been drinking alcohol. Do not ride with someone who has been drinking. When you are tired or distracted. While texting. If you have been using any mind-altering substances or drugs. Wear a helmet and other protective equipment during sports activities. If you have firearms in your house, make sure you follow all gun safety procedures. Seek help if you have been physically or sexually abused. What's next? Visit your health care provider once a year for an annual wellness visit. Ask your health care provider how often you should have your eyes and teeth checked. Stay up to date on all vaccines. This information is not intended to replace advice given to you by your health care provider. Make sure you discuss any questions you have with your health care provider. Document Revised: 01/17/2021 Document Reviewed: 01/17/2021 Elsevier Patient Education  2024 Elsevier Inc.  

## 2023-03-28 NOTE — Assessment & Plan Note (Signed)
Well adult Orders Placed This Encounter  Procedures   CMP14+EGFR   CBC with Differential   Lipid Panel With LDL/HDL Ratio   HgB A1c   Urine Microalbumin w/creat. ratio  Screenings: per lab orders Immunization:  Flu vaccine given today.  Anticipatory guidance/Risk factor reduction:  Recommendations for AVS.

## 2023-03-30 ENCOUNTER — Encounter: Payer: Self-pay | Admitting: Family Medicine

## 2023-03-30 LAB — CMP14+EGFR
ALT: 20 IU/L (ref 0–32)
AST: 20 IU/L (ref 0–40)
Albumin: 4.4 g/dL (ref 3.8–4.9)
Alkaline Phosphatase: 118 IU/L (ref 44–121)
BUN/Creatinine Ratio: 13 (ref 9–23)
BUN: 11 mg/dL (ref 6–24)
Bilirubin Total: 0.3 mg/dL (ref 0.0–1.2)
CO2: 27 mmol/L (ref 20–29)
Calcium: 9.4 mg/dL (ref 8.7–10.2)
Chloride: 103 mmol/L (ref 96–106)
Creatinine, Ser: 0.88 mg/dL (ref 0.57–1.00)
Globulin, Total: 2.4 g/dL (ref 1.5–4.5)
Glucose: 78 mg/dL (ref 70–99)
Potassium: 4.3 mmol/L (ref 3.5–5.2)
Sodium: 143 mmol/L (ref 134–144)
Total Protein: 6.8 g/dL (ref 6.0–8.5)
eGFR: 78 mL/min/{1.73_m2} (ref >59)

## 2023-03-30 LAB — LIPID PANEL WITH LDL/HDL RATIO
Cholesterol, Total: 161 mg/dL (ref 100–199)
HDL: 44 mg/dL (ref >39)
LDL Chol Calc (NIH): 79 mg/dL (ref 0–99)
LDL/HDL Ratio: 1.8 ratio (ref 0.0–3.2)
Triglycerides: 227 mg/dL — ABNORMAL HIGH (ref 0–149)
VLDL Cholesterol Cal: 38 mg/dL (ref 5–40)

## 2023-03-30 LAB — CBC WITH DIFFERENTIAL/PLATELET
Basophils Absolute: 0 10*3/uL (ref 0.0–0.2)
Basos: 0 %
EOS (ABSOLUTE): 0.1 10*3/uL (ref 0.0–0.4)
Eos: 2 %
Hematocrit: 42.8 % (ref 34.0–46.6)
Hemoglobin: 14.3 g/dL (ref 11.1–15.9)
Immature Grans (Abs): 0 10*3/uL (ref 0.0–0.1)
Immature Granulocytes: 0 %
Lymphocytes Absolute: 1.6 10*3/uL (ref 0.7–3.1)
Lymphs: 33 %
MCH: 31.5 pg (ref 26.6–33.0)
MCHC: 33.4 g/dL (ref 31.5–35.7)
MCV: 94 fL (ref 79–97)
Monocytes Absolute: 0.3 10*3/uL (ref 0.1–0.9)
Monocytes: 6 %
Neutrophils Absolute: 2.8 10*3/uL (ref 1.4–7.0)
Neutrophils: 59 %
Platelets: 157 10*3/uL (ref 150–450)
RBC: 4.54 x10E6/uL (ref 3.77–5.28)
RDW: 14.4 % (ref 11.7–15.4)
WBC: 4.8 10*3/uL (ref 3.4–10.8)

## 2023-03-30 LAB — MICROALBUMIN / CREATININE URINE RATIO
Creatinine, Urine: 78.5 mg/dL
Microalb/Creat Ratio: <4 mg/g{creat} (ref 0–29)
Microalbumin, Urine: <3 ug/mL

## 2023-03-30 LAB — HEMOGLOBIN A1C
Est. average glucose Bld gHb Est-mCnc: 108 mg/dL
Hgb A1c MFr Bld: 5.4 % (ref 4.8–5.6)

## 2023-03-31 ENCOUNTER — Encounter: Payer: Self-pay | Admitting: Family Medicine

## 2023-03-31 ENCOUNTER — Other Ambulatory Visit: Payer: Self-pay

## 2023-03-31 ENCOUNTER — Other Ambulatory Visit: Payer: Self-pay | Admitting: Family Medicine

## 2023-03-31 ENCOUNTER — Encounter (HOSPITAL_COMMUNITY): Payer: Self-pay

## 2023-03-31 ENCOUNTER — Other Ambulatory Visit (HOSPITAL_COMMUNITY): Payer: Self-pay

## 2023-03-31 MED ORDER — ONABOTULINUMTOXINA 200 UNITS IJ SOLR
200.0000 [IU] | INTRAMUSCULAR | 2 refills | Status: DC
  Filled 2023-03-31 – 2023-04-16 (×2): qty 1, 90d supply, fill #0
  Filled 2023-07-22: qty 1, 90d supply, fill #1

## 2023-03-31 NOTE — Addendum Note (Signed)
Addended by: Danne Harbor on: 03/31/2023 08:54 AM   Modules accepted: Orders

## 2023-03-31 NOTE — Telephone Encounter (Signed)
Received approval, auth#: [id number] (03/28/23-09/24/23.  Please send rx to [facility], thank you.

## 2023-03-31 NOTE — Telephone Encounter (Signed)
sent 

## 2023-04-01 ENCOUNTER — Other Ambulatory Visit: Payer: Self-pay

## 2023-04-01 ENCOUNTER — Other Ambulatory Visit (HOSPITAL_COMMUNITY): Payer: Self-pay

## 2023-04-01 ENCOUNTER — Other Ambulatory Visit: Payer: Self-pay | Admitting: Family Medicine

## 2023-04-01 MED ORDER — TIRZEPATIDE 12.5 MG/0.5ML ~~LOC~~ SOAJ
12.5000 mg | SUBCUTANEOUS | 0 refills | Status: DC
  Filled 2023-04-01: qty 6, 84d supply, fill #0

## 2023-04-01 NOTE — Telephone Encounter (Signed)
LVM asking pt to call back and schedule Botox. If she returns call, please schedule at least 2-3 weeks out with Dr. Terrace Arabia.

## 2023-04-02 ENCOUNTER — Other Ambulatory Visit (HOSPITAL_COMMUNITY): Payer: Self-pay

## 2023-04-02 ENCOUNTER — Other Ambulatory Visit: Payer: Self-pay

## 2023-04-04 ENCOUNTER — Other Ambulatory Visit: Payer: Self-pay

## 2023-04-05 ENCOUNTER — Encounter: Payer: Self-pay | Admitting: Sports Medicine

## 2023-04-05 ENCOUNTER — Encounter: Payer: Self-pay | Admitting: Family Medicine

## 2023-04-08 ENCOUNTER — Other Ambulatory Visit (HOSPITAL_COMMUNITY): Payer: Self-pay

## 2023-04-08 ENCOUNTER — Other Ambulatory Visit: Payer: Self-pay

## 2023-04-08 DIAGNOSIS — F3181 Bipolar II disorder: Secondary | ICD-10-CM | POA: Diagnosis not present

## 2023-04-09 ENCOUNTER — Ambulatory Visit (INDEPENDENT_AMBULATORY_CARE_PROVIDER_SITE_OTHER): Payer: Commercial Managed Care - PPO

## 2023-04-09 DIAGNOSIS — Z01419 Encounter for gynecological examination (general) (routine) without abnormal findings: Secondary | ICD-10-CM

## 2023-04-09 DIAGNOSIS — Z780 Asymptomatic menopausal state: Secondary | ICD-10-CM | POA: Diagnosis not present

## 2023-04-09 DIAGNOSIS — N950 Postmenopausal bleeding: Secondary | ICD-10-CM | POA: Diagnosis not present

## 2023-04-09 DIAGNOSIS — N939 Abnormal uterine and vaginal bleeding, unspecified: Secondary | ICD-10-CM | POA: Diagnosis not present

## 2023-04-09 DIAGNOSIS — Z1231 Encounter for screening mammogram for malignant neoplasm of breast: Secondary | ICD-10-CM | POA: Diagnosis not present

## 2023-04-10 DIAGNOSIS — Z0000 Encounter for general adult medical examination without abnormal findings: Secondary | ICD-10-CM | POA: Diagnosis not present

## 2023-04-10 DIAGNOSIS — Z23 Encounter for immunization: Secondary | ICD-10-CM

## 2023-04-10 DIAGNOSIS — Z7985 Long-term (current) use of injectable non-insulin antidiabetic drugs: Secondary | ICD-10-CM | POA: Diagnosis not present

## 2023-04-10 DIAGNOSIS — L0292 Furuncle, unspecified: Secondary | ICD-10-CM | POA: Diagnosis not present

## 2023-04-10 DIAGNOSIS — E1169 Type 2 diabetes mellitus with other specified complication: Secondary | ICD-10-CM | POA: Diagnosis not present

## 2023-04-11 ENCOUNTER — Encounter: Payer: Self-pay | Admitting: Neurology

## 2023-04-11 ENCOUNTER — Encounter: Payer: Self-pay | Admitting: Family Medicine

## 2023-04-11 ENCOUNTER — Other Ambulatory Visit: Payer: Self-pay

## 2023-04-14 ENCOUNTER — Telehealth: Payer: Self-pay

## 2023-04-14 ENCOUNTER — Other Ambulatory Visit: Payer: Self-pay

## 2023-04-14 ENCOUNTER — Other Ambulatory Visit (HOSPITAL_COMMUNITY): Payer: Self-pay

## 2023-04-14 NOTE — Telephone Encounter (Signed)
Initiated Prior authorization NWG:NFAOZH 75MG  dispersible tablets Via: Covermymeds Case/Key:[id number] Status: partially  denied as of 04/14/23 Reason:The request has been partially denied. The authorization is effective from 04/15/2023 to 04/13/2024, as long as the member is enrolled in their current health plan. You or your doctor asked [place] to cover a higher amount of the above-named drug than allowed under our prior authorization rule. Your provider requested a quantity of thirty (30) Nurtec ODT 75mg  tablets per 30-day supply, but your plan allows for a quantity of eighteen (18) Nurtec ODT 75mg  tablets per 30-day supply Notified Pt via: Mychart

## 2023-04-15 ENCOUNTER — Other Ambulatory Visit: Payer: Self-pay | Admitting: Adult Health

## 2023-04-15 ENCOUNTER — Encounter: Payer: Self-pay | Admitting: Family Medicine

## 2023-04-15 ENCOUNTER — Other Ambulatory Visit: Payer: Self-pay | Admitting: Family Medicine

## 2023-04-15 ENCOUNTER — Other Ambulatory Visit (HOSPITAL_COMMUNITY): Payer: Self-pay

## 2023-04-15 DIAGNOSIS — F410 Panic disorder [episodic paroxysmal anxiety] without agoraphobia: Secondary | ICD-10-CM

## 2023-04-15 NOTE — Progress Notes (Signed)
Chief Complaint: Patient was seen in virtual consultation today for bilateral knee pain  Referring Physician(s): Thekkekandam,Thomas J  History of Present Illness: Tracy [name] is a 57 y.o. female with a medical history significant for anxiety/depression, bipolar disorder, borderline personality disorder, PTSD, migraines, DM2, HTN, IBS, cirrhosis, morbid obesity and primary osteoarthritis of both knees. She has been treated conservatively with steroid injections and physical therapy without much relief. She is not a candidate for arthroplasty due to her BMI being greater than 40. She has been referred to Interventional Radiology to discuss geniculate artery embolization.   WOMAC Pain Score:  VAS Pain Score:   Past Medical History:  Diagnosis Date   Acanthosis nigricans, acquired 02/09/2015   Anxiety and depression 10/31/2014   Arthritis    back, knees, right elbow   Arthritis of left hip 06/25/2018   Bilateral swelling of feet and ankles    Bipolar disorder (HCC)    Borderline personality disorder (HCC)    Cancer (HCC)    oral lichen planus, uses mouth wash, cream   Carpal tunnel syndrome 09/06/2015   Chewing difficulty    Chronic left shoulder pain 05/17/2020   Constipation    Coronary artery disease 03/21/2021   COVID 10/08/2021   Cystic teratoma    BENIGN   Dental crowns present    Diabetes mellitus without complication (HCC)    Diabetes mellitus, type 2 (HCC) 06/10/2013   Diarrhea    Diverticulosis of colon without hemorrhage    DOE (dyspnea on exertion) 03/21/2021   Dyspepsia 12/14/2020   Essential hypertension 10/02/2007   Qualifier: Diagnosis of  By: Everardo All MD, Sean A    Facial rash 06/16/2019   Female hirsutism 02/09/2015   Food allergy    Walnuts   GAD (generalized anxiety disorder)    Gastritis and gastroduodenitis    Gastroesophageal reflux disease    Grade II internal hemorrhoids    Greater trochanteric bursitis of left hip 07/24/2018    Hemorrhoids, internal 07/18/2017   History of kidney stones    History of migraine headaches    History of posttraumatic stress disorder (PTSD)    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/2015   Hypothyroidism    HYPOTHYROIDISM, POSTSURGICAL 10/02/2007   Qualifier: Diagnosis of  By: Everardo All MD, Sean A    IBS (irritable bowel syndrome)    Infertility associated with anovulation 02/09/2015   Insulin resistance 02/09/2015   Joint pain    Leg swelling 09/21/2020   Lichen planus    Liver cirrhosis secondary to NASH (nonalcoholic steatohepatitis) (HCC)    Lower back pain 07/18/2017   MDD (major depressive disorder) 06/06/2015   Migraine 10/02/2007   Morbid obesity with BMI of 40.0-44.9, adult (HCC) 06/23/2020   NASH (nonalcoholic steatohepatitis)    Nausea without vomiting    Nonalcoholic fatty liver disease 02/09/2015   Obstructive sleep apnea 06/27/2014   Oral mucosal lesion 06/17/2019   Osteoarthritis    Other fatigue    PCOS (polycystic ovarian syndrome)    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   Qualifier: Diagnosis of  By: Everardo All MD, Sean A    Trigeminal neuralgia    Unilateral primary osteoarthritis, left hip 02/10/2019   Vitamin D deficiency     Past Surgical History:  Procedure Laterality Date   ACHILLES TENDON SURGERY Left 2007   APPENDECTOMY     CARPAL TUNNEL RELEASE Right  01/12/2016   Procedure: RIGHT CARPAL TUNNEL RELEASE;  Surgeon: Dominica Severin, MD;  Location: [facility];  Service: Orthopedics;  Laterality: Right;   COLONOSCOPY WITH PROPOFOL N/A 04/20/2014   Procedure: COLONOSCOPY WITH PROPOFOL;  Surgeon: Willis Modena, MD;  Location: [facility];  Service: Endoscopy;  Laterality: N/A;   COLONOSCOPY WITH PROPOFOL N/A 01/10/2021   Procedure: COLONOSCOPY WITH PROPOFOL;  Surgeon: Shellia Cleverly, DO;  Location: [facility];  Service: Gastroenterology;  Laterality: N/A;   ESOPHAGOGASTRODUODENOSCOPY (EGD) WITH PROPOFOL N/A 04/20/2014   Procedure: ESOPHAGOGASTRODUODENOSCOPY (EGD) WITH PROPOFOL;  Surgeon: Willis Modena, MD;  Location: [facility];  Service: Endoscopy;  Laterality: N/A;   ESOPHAGOGASTRODUODENOSCOPY (EGD) WITH PROPOFOL N/A 01/10/2021   Procedure: ESOPHAGOGASTRODUODENOSCOPY (EGD) WITH PROPOFOL;  Surgeon: Shellia Cleverly, DO;  Location: [facility];  Service: Gastroenterology;  Laterality: N/A;   EXCISION HAGLUND'S DEFORMITY WITH ACHILLES TENDON REPAIR Right 02/25/2013   Procedure: RIGHT ACHILLES DEBRIDEMENT AND RECONSTRUCTION;  HAGLUND'S EXCISION; GASTROC RECESSION AND FLEXOR HALLUCIS LONGUS TRANSFER;  Surgeon: Toni Arthurs, MD;  Location: [facility];  Service: Orthopedics;  Laterality: Right;   JOINT REPLACEMENT     hip   LUMBAR LAMINECTOMY     X 3   neck fusion     OVARIAN CYST SURGERY Left 1998   PILONIDAL CYST EXCISION  1994   RIGHT OOPHORECTOMY     SHOULDER ARTHROSCOPY WITH ROTATOR CUFF REPAIR Right 10/17/2022   Procedure: Right shoulder arthroscopy, debridement, subacromial decompression, distal clavicle resection, rotator cuff repair;  Surgeon: Francena Hanly, MD;  Location: [facility];  Service: Orthopedics;  Laterality: Right;    STERIOD INJECTION Left 01/12/2016   Procedure: STEROID INJECTION LEFT WRIST;  Surgeon: Dominica Severin, MD;  Location: [facility];  Service: Orthopedics;  Laterality:  Left;   TOTAL HIP ARTHROPLASTY Left 03/19/2019   Procedure: LEFT TOTAL HIP ARTHROPLASTY ANTERIOR APPROACH;  Surgeon: Kathryne Hitch, MD;  Location: [facility];  Service: Orthopedics;  Laterality: Left;   TOTAL THYROIDECTOMY  2003   UPPER GI ENDOSCOPY  01/19/2015    Allergies: Sulfa antibiotics, Food, Bupropion hcl, Codeine, and Sulfamethoxazole-trimethoprim  Medications: Prior to Admission medications   Medication Sig Start Date End Date Taking? Authorizing Provider  Armodafinil 200 MG TABS Take 1 tablet (200 mg total) by mouth daily as needed (sleepiness). 03/14/23   Mozingo, Thereasa Solo, NP  Atogepant (QULIPTA) 60 MG TABS Take 1 tablet (60 mg total) by mouth daily. 03/20/23   Levert Feinstein, MD  Blood Glucose Monitoring Suppl (FREESTYLE LITE) w/Device KIT Use as directed. 01/01/22   Monica Becton, MD  Blood Glucose Monitoring Suppl (FREESTYLE LITE) w/Device KIT Check fasting blood sugar every morning and before meals up to 4 times daily. 01/01/22   Everrett Coombe, DO  botulinum toxin Type A (BOTOX) 200 units injection Inject 200 Units into the muscle every 3 (three) months. 03/31/23   Levert Feinstein, MD  clobetasol (TEMOVATE) 0.05 % GEL Apply small amount to  affected area twice daily using finger. Gently pat dry with cotton gauze.  Do not eat,drink, or speak for 30 minutes to allow absorption. 03/21/22     clonazePAM (KLONOPIN) 1 MG tablet Take 1 tablet (1 mg total) by mouth 3 (three) times daily. 12/19/22   Mozingo, Thereasa Solo, NP  Continuous Glucose Sensor (DEXCOM G6 SENSOR) MISC Replace every 10 days.  Use to check blood sugar. 03/19/23   Everrett Coombe, DO  Continuous Glucose Transmitter (DEXCOM G6 TRANSMITTER) MISC Use to check continuous blood sugar.  Replace every 90 days 03/19/23   Everrett Coombe, DO  cyclobenzaprine (FLEXERIL) 10 MG tablet Take 1 tablet (10 mg total) by mouth 3 (three) times daily as needed for muscle spasms. 10/17/22   Shuford, French Ana, PA-C  diclofenac Sodium  (VOLTAREN) 1 % GEL Apply 1 Application topically 4 (four) times daily as needed (pain).    [provider]  doxycycline (VIBRA-TABS) 100 MG tablet Take 1 tablet (100 mg total) by mouth 2 (two) times daily. 03/28/23   Everrett Coombe, DO  ferrous sulfate 325 (65 FE) MG EC tablet Take 1 tablet (325 mg total) by mouth in the morning and at bedtime. Take iron supplement 2 hours before or 4 hours after PPI or other antacids as this requires some level of gastric acidity to aid in absorption Patient taking differently: Take 325 mg by mouth daily. Take iron supplement 2 hours before or 4 hours after PPI or other antacids as this requires some level of gastric acidity to aid in absorption 09/05/22   Monica Becton, MD  folic acid (FOLVITE) 1 MG tablet Take 1 tablet (1 mg total) by mouth daily. 03/11/23   Everrett Coombe, DO  furosemide (LASIX) 40 MG tablet Take 1 tablet (40 mg total) by mouth daily. 01/13/23   Everrett Coombe, DO  glucose blood (FREESTYLE LITE) test strip Use as instructed 02/11/21   Couture, Cortni S, PA-C  glucose blood (FREESTYLE LITE) test strip Check fasting blood sugar every morning and before meals up to 4 times a day as directed. 01/01/22   Everrett Coombe, DO  hydrOXYzine (ATARAX) 25 MG tablet Take 1 tablet (25 mg total) by mouth 3 (three) times daily as needed for anxiety. 06/24/22   Mozingo, Thereasa Solo, NP  insulin lispro (HUMALOG KWIKPEN) 100 UNIT/ML KwikPen Inject up to 16 units 3 times daily at mealtime per sliding scale. Patient taking differently: Inject 3-16 Units into the skin 3 (three) times daily as needed (when taking steroids). 05/07/22   Everrett Coombe, DO  Insulin Pen Needle (UNIFINE PENTIPS) 31G X 5 MM MISC Use as directed to inject insulin 01/28/22   Everrett Coombe, DO  ketorolac (TORADOL) 10 MG tablet Take 1 tablet (10 mg total) by mouth every 8 (eight) hours as needed. 01/10/23   Monica Becton, MD  lamoTRIgine (LAMICTAL) 200 MG tablet Take 2 tablets by  mouth at bedtime. 08/12/22   Mozingo, Thereasa Solo, NP  Lancets 30G MISC Check fasting blood sugar every morning and before meals up to 4 times daily. 01/01/22   Everrett Coombe, DO  lidocaine (LIDODERM) 5 % Place 1 patch onto the skin every 12 (twelve) hours. Remove & Discard patch within 12 hours or as directed by MD 11/28/22   Charlton Amor, DO  lidocaine (XYLOCAINE) 5 % ointment Apply topically to affected areas 3 times daily as needed. 02/18/22   Everrett Coombe, DO  lisinopril (ZESTRIL) 10 MG tablet Take 1 tablet (10 mg total) by mouth  daily. 04/26/22   Everrett Coombe, DO  meloxicam (MOBIC) 15 MG tablet Take 1 tablet by mouth once every morning with a meal for 2 weeks, then daily as needed for pain. 10/17/22   Shuford, French Ana, PA-C  Menthol, Topical Analgesic, (BENGAY EX) Apply 1 Application topically daily as needed (pain).    [provider]  neomycin-bacitracin-polymyxin [phone] OINT Apply topically 2 (two) times daily for 10 days To corners of mouth Patient taking differently: Apply 1 Application topically 2 (two) times daily as needed (cracking/ raw skin around mouth). 01/17/22     nystatin (MYCOSTATIN) 100000 UNIT/ML suspension Use 5ml to swish in mouth and swallow --use 4 times daily for 10 days 09/26/22     nystatin [facility]) powder Apply 3 times daily as directed. Patient taking differently: Apply 1 application  topically 3 (three) times daily as needed (rash). 11/27/21   Everrett Coombe, DO  nystatin ointment (MYCOSTATIN) Apply to the skin 2 times daily to corners of mouth Patient taking differently: Apply 1 Application topically 2 (two) times daily as needed (raw/cracking skin around the mouth). 01/17/22     ondansetron (ZOFRAN) 4 MG tablet Take 1 tablet (4 mg total) by mouth every 8 (eight) hours as needed for nausea or vomiting. 10/17/22   Shuford, French Ana, PA-C  ondansetron (ZOFRAN-ODT) 4 MG disintegrating tablet Dissolve 1 tablet (4 mg total) by mouth 2 (two) times daily as needed  for nausea or vomiting. 08/30/21   Everrett Coombe, DO  pantoprazole (PROTONIX) 40 MG tablet Take 2 tablets (80 mg total) by mouth 2 (two) times daily. 01/13/23   Everrett Coombe, DO  pimecrolimus (ELIDEL) 1 % cream Apply a small amount to affected area 2 x daily. Gently pat area dry w/cotton gauze. No eating, drinking or speaking for 20 - 30 minutes to allow medicine to be absorbed. Can apply to 2x2 gauze & placed against the affected area. Patient taking differently: Apply 1 Application topically 2 (two) times daily as needed (ulcers). 01/17/22     pregabalin (LYRICA) 75 MG capsule Take 1 capsule (75 mg total) by mouth 3 (three) times daily. 03/14/23   Monica Becton, MD  Rimegepant Sulfate (NURTEC) 75 MG TBDP Dissolve 1 tablet (75 mg total) by mouth once as needed for up to 1 dose (migraines). 03/14/23   Everrett Coombe, DO  risperiDONE (RISPERDAL) 1 MG tablet Take 1.5 tablets (1.5 mg total) by mouth at bedtime. 02/17/23   Mozingo, Thereasa Solo, NP  rosuvastatin (CRESTOR) 20 MG tablet TAKE 1 TABLET BY MOUTH DAILY. 04/26/22 04/26/23  Everrett Coombe, DO  sertraline (ZOLOFT) 100 MG tablet Take 2 tablets (200 mg total) by mouth every morning. 06/24/22 07/10/23  Mozingo, Thereasa Solo, NP  spironolactone (ALDACTONE) 50 MG tablet Take 1 tablet (50 mg total) by mouth 2 (two) times daily. 06/26/22   Everrett Coombe, DO  SYNTHROID 200 MCG tablet Take 1 tablet (200 mcg total) by mouth daily before breakfast. 03/20/23   Everrett Coombe, DO  tirzepatide [facility]) 12.5 MG/0.5ML Pen Inject 12.5 mg into the skin once a week. 04/01/23   Everrett Coombe, DO     Family History  Problem Relation Age of Onset   Heart disease Father    Depression Father    Heart attack Father    Sudden death Father    Anxiety disorder Father    Bipolar disorder Father    Obesity Father    Cancer Brother        THROAT cancer   Depression Brother  Anxiety disorder Brother    Depression Sister    Breast cancer Mother     Hypertension Mother    Cancer Mother    Depression Mother    Sleep apnea Mother    Obesity Mother    Depression Maternal Grandmother    Stroke Maternal Grandfather    Colon cancer Brother    Pancreatic cancer Neg Hx    Stomach cancer Neg Hx    Liver disease Neg Hx     Social History   Socioeconomic History   Marital status: Divorced    Spouse name: Not on file   Number of children: 0   Years of education: 16   Highest education level: Bachelor's degree (e.g., BA, AB, BS)  Occupational History   Occupation: RN-Telephone Triage Nurse    Employer: [employer]    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   Drug use: No   Sexual activity: Not Currently    Partners: Male    Birth control/protection: Condom  Other Topics Concern   Not on file  Social History Narrative   RN - [place]   Regular exercise: no   Caffeine use: 2 x daily   Right-handed.   Lives alone.   Social Determinants of Health   Financial Resource Strain: Medium Risk (12/22/2022)   Overall Financial Resource Strain (CARDIA)    Difficulty of Paying Living Expenses: Somewhat hard  Food Insecurity: No Food Insecurity (12/22/2022)   Hunger Vital Sign    Worried About Running Out of Food in the Last Year: Never true    Ran Out of Food in the Last Year: Never true  Transportation Needs: No Transportation Needs (12/22/2022)   PRAPARE - Administrator, Civil Service (Medical): No    Lack of Transportation  (Non-Medical): No  Physical Activity: Unknown (12/22/2022)   Exercise Vital Sign    Days of Exercise per Week: 0 days    Minutes of Exercise per Session: Not on file  Stress: Stress Concern Present (12/22/2022)   [employer] of Occupational Health - Occupational Stress Questionnaire    Feeling of Stress : Very much  Social Connections: Socially Isolated (12/22/2022)   Social Connection and Isolation Panel [NHANES]    Frequency of Communication with Friends and Family: Three times a week    Frequency of Social Gatherings with Friends and Family: Never    Attends Religious Services: Never    Database administrator or Organizations: No    Attends Engineer, structural: Not on file    Marital Status: Divorced     Review of Systems: A 12 point ROS discussed and pertinent positives are indicated in the HPI above.  All other systems are negative.  Review of Systems  Vital Signs: LMP 08/24/2020   No physical exam was performed in lieu of virtual telephone visit.   Imaging: MM 3D SCREENING MAMMOGRAM BILATERAL BREAST  Result Date: 04/10/2023 CLINICAL DATA:  Screening. EXAM: DIGITAL SCREENING BILATERAL MAMMOGRAM WITH TOMOSYNTHESIS AND CAD TECHNIQUE: Bilateral screening digital craniocaudal and mediolateral oblique mammograms were obtained. Bilateral screening digital breast tomosynthesis was performed. The images were evaluated with computer-aided detection. COMPARISON:  Previous exam(s). ACR Breast Density Category a: The breasts are almost entirely fatty. FINDINGS: There are no findings suspicious for malignancy. IMPRESSION: No mammographic evidence of malignancy. A result letter of this screening mammogram will be mailed directly to the patient. RECOMMENDATION: Screening mammogram in one year. (Code:SM-B-01Y) BI-RADS CATEGORY  1: Negative. Electronically Signed   By: Harmon Pier M.D.   On: 04/10/2023 08:12    Labs:  CBC: Recent Labs    10/11/22 1450 03/28/23 0938  WBC 6.0  4.8  HGB 13.5 14.3  HCT 40.1 42.8  PLT 152 157    COAGS: No results for input(s): "INR", "APTT" in the last 8760 hours.  BMP: Recent Labs    10/11/22 1450 11/28/22 1509 03/28/23 0938  NA 137 140 143  K 3.4* 3.5 4.3  CL 106 103 103  CO2 22 27 27   GLUCOSE 80 137* 78  BUN 11 14 11   CALCIUM 8.8* 9.8 9.4  CREATININE 0.69 0.95 0.88  GFRNONAA >60  --   --     LIVER FUNCTION TESTS: Recent Labs    10/11/22 1450 03/28/23 0938  BILITOT 0.5 0.3  AST 24 20  ALT 28 20  ALKPHOS 102 118  PROT 7.1 6.8  ALBUMIN 3.7 4.4    TUMOR MARKERS: No results for input(s): "AFPTM", "CEA", "CA199", "CHROMGRNA" in the last 8760 hours.  Assessment and Plan:  57 year old female with a history of primary osteoarthritis of both knees unresponsive to conservative treatments.   Thank you for this interesting consult.  I greatly enjoyed meeting Tracy [name] and look forward to participating in their care.  A copy of this report was sent to the requesting provider on this date.  Electronically Signed: Mickie Kay, NP 04/15/2023, 3:24 PM   I spent a total of 40 Minutes    in virtual clinical consultation, greater than 50% of which was counseling/coordinating care for bilateral knee pain.

## 2023-04-16 ENCOUNTER — Other Ambulatory Visit: Payer: Self-pay | Admitting: Family Medicine

## 2023-04-16 ENCOUNTER — Other Ambulatory Visit (HOSPITAL_COMMUNITY): Payer: Self-pay

## 2023-04-16 ENCOUNTER — Other Ambulatory Visit: Payer: Self-pay

## 2023-04-16 ENCOUNTER — Ambulatory Visit
Admission: RE | Admit: 2023-04-16 | Discharge: 2023-04-16 | Disposition: A | Discharge status: Home / Self Care | Payer: Commercial Managed Care - PPO | Source: Ambulatory Visit | Attending: Sports Medicine | Admitting: Sports Medicine

## 2023-04-16 ENCOUNTER — Telehealth: Payer: Self-pay

## 2023-04-16 DIAGNOSIS — G8929 Other chronic pain: Secondary | ICD-10-CM

## 2023-04-16 DIAGNOSIS — M170 Bilateral primary osteoarthritis of knee: Secondary | ICD-10-CM

## 2023-04-16 HISTORY — PX: IR RADIOLOGIST EVAL & MGMT: IMG5224

## 2023-04-16 MED ORDER — NURTEC 75 MG PO TBDP
1.0000 | ORAL_TABLET | Freq: Once | ORAL | 3 refills | Status: DC | PRN
  Filled 2023-04-16: qty 16, 60d supply, fill #0

## 2023-04-16 MED ORDER — CLONAZEPAM 1 MG PO TABS
1.0000 mg | ORAL_TABLET | Freq: Three times a day (TID) | ORAL | 0 refills | Status: DC
  Filled 2023-04-16: qty 90, 30d supply, fill #0

## 2023-04-16 NOTE — Telephone Encounter (Signed)
Pharmacy Patient Advocate Encounter  Received notification from [facility] that Prior Authorization for Qulipta 60MG  tablets has been APPROVED from 04/16/2023 to 10/14/2023. Ran test claim, Copay is $0 per 30DS. This test claim was processed through [facility]- copay amounts may vary at other pharmacies due to pharmacy/plan contracts, or as the patient moves through the different stages of their insurance plan.   PA #/Case ID/Reference #: PA Case ID #: [id number]

## 2023-04-16 NOTE — Telephone Encounter (Signed)
Pt called back and accepted appt with Dr. Terrace Arabia for 10/2 @ 3:00 pm.

## 2023-04-16 NOTE — Telephone Encounter (Signed)
Pharmacy Patient Advocate Encounter   Received notification from Patient Advice Request messages that prior authorization for Qulipta 60MG  tablets is required/requested.   Insurance verification completed.   The patient is insured through [facility] .   Per test claim: PA required; PA submitted to [facility] via CoverMyMeds Key/confirmation #/EOC [id number] Status is pending

## 2023-04-17 ENCOUNTER — Other Ambulatory Visit: Payer: Self-pay

## 2023-04-17 ENCOUNTER — Other Ambulatory Visit (HOSPITAL_COMMUNITY): Payer: Self-pay

## 2023-04-17 MED ORDER — PANTOPRAZOLE SODIUM 40 MG PO TBEC
80.0000 mg | DELAYED_RELEASE_TABLET | Freq: Two times a day (BID) | ORAL | 3 refills | Status: DC
  Filled 2023-04-17: qty 60, 15d supply, fill #0
  Filled 2023-05-14: qty 60, 15d supply, fill #1
  Filled 2023-06-18: qty 60, 15d supply, fill #2
  Filled 2023-07-08: qty 60, 15d supply, fill #3

## 2023-04-20 ENCOUNTER — Encounter: Payer: Self-pay | Admitting: Sports Medicine

## 2023-04-21 ENCOUNTER — Encounter: Payer: Self-pay | Admitting: Family Medicine

## 2023-04-22 ENCOUNTER — Other Ambulatory Visit: Payer: Self-pay

## 2023-04-22 ENCOUNTER — Other Ambulatory Visit (HOSPITAL_COMMUNITY): Payer: Self-pay

## 2023-04-22 ENCOUNTER — Other Ambulatory Visit: Payer: Self-pay | Admitting: Family Medicine

## 2023-04-22 DIAGNOSIS — F3181 Bipolar II disorder: Secondary | ICD-10-CM | POA: Diagnosis not present

## 2023-04-22 MED ORDER — TRAMADOL HCL 50 MG PO TABS: 50.0000 mg | ORAL_TABLET | Freq: Three times a day (TID) | ORAL | 0 refills | Status: DC | PRN

## 2023-04-22 MED ORDER — TRAMADOL HCL 50 MG PO TABS
50.0000 mg | ORAL_TABLET | Freq: Three times a day (TID) | ORAL | 0 refills | Status: DC | PRN
Start: 2023-04-22 — End: 2023-04-22
  Filled 2023-04-22: qty 15, 5d supply, fill #0

## 2023-04-22 NOTE — Telephone Encounter (Signed)
Tracy [name] sent a message requesting the tramadol to be sent to [facility] not [facility]. I have cancelled the prescription at [facility] per patient request.

## 2023-04-23 DIAGNOSIS — F3181 Bipolar II disorder: Secondary | ICD-10-CM | POA: Diagnosis not present

## 2023-04-25 ENCOUNTER — Ambulatory Visit: Payer: Commercial Managed Care - PPO | Admitting: Adult Health

## 2023-04-26 ENCOUNTER — Other Ambulatory Visit (HOSPITAL_COMMUNITY): Payer: Self-pay

## 2023-04-28 ENCOUNTER — Other Ambulatory Visit (HOSPITAL_COMMUNITY): Payer: Self-pay

## 2023-04-29 ENCOUNTER — Other Ambulatory Visit (HOSPITAL_COMMUNITY): Payer: Self-pay

## 2023-04-29 ENCOUNTER — Other Ambulatory Visit: Payer: Self-pay

## 2023-05-01 ENCOUNTER — Other Ambulatory Visit (HOSPITAL_COMMUNITY): Payer: Self-pay

## 2023-05-01 ENCOUNTER — Other Ambulatory Visit: Payer: Self-pay

## 2023-05-01 DIAGNOSIS — L438 Other lichen planus: Secondary | ICD-10-CM | POA: Diagnosis not present

## 2023-05-01 MED ORDER — FLUOCINONIDE 0.05 % EX GEL
CUTANEOUS | 0 refills | Status: DC
  Filled 2023-05-01: qty 30, 30d supply, fill #0

## 2023-05-05 ENCOUNTER — Encounter: Payer: Self-pay | Admitting: Neurology

## 2023-05-07 ENCOUNTER — Other Ambulatory Visit (HOSPITAL_COMMUNITY): Payer: Self-pay | Admitting: Interventional Radiology

## 2023-05-07 ENCOUNTER — Telehealth (HOSPITAL_COMMUNITY): Payer: Self-pay | Admitting: Radiology

## 2023-05-07 ENCOUNTER — Ambulatory Visit (INDEPENDENT_AMBULATORY_CARE_PROVIDER_SITE_OTHER): Payer: Commercial Managed Care - PPO | Admitting: Neurology

## 2023-05-07 ENCOUNTER — Other Ambulatory Visit (HOSPITAL_COMMUNITY): Payer: Self-pay

## 2023-05-07 VITALS — BP 123/79

## 2023-05-07 DIAGNOSIS — G43709 Chronic migraine without aura, not intractable, without status migrainosus: Secondary | ICD-10-CM

## 2023-05-07 DIAGNOSIS — M170 Bilateral primary osteoarthritis of knee: Secondary | ICD-10-CM

## 2023-05-07 MED ORDER — ONABOTULINUMTOXINA 200 UNITS IJ SOLR
155.0000 [IU] | Freq: Once | INTRAMUSCULAR | Status: AC
Start: 2023-05-07 — End: ?

## 2023-05-07 NOTE — Progress Notes (Signed)
BOTOX injection was performed according to protocol by [employer]er[employer]. 100 units of BOTOX was dissolved into 2 cc NS.             Patient tolerate the injection well.  Extra 45 units was injected into bilateral levator scapula,  cervical paraspinal muscles Will return for repeat injection in 3 months.

## 2023-05-07 NOTE — Telephone Encounter (Signed)
Called pt, left a VM with her upcoming GAE procedure instructions on it. Dr. Elby Showers will perform on 10/10. Also, left my number for her to call back if needed. JM

## 2023-05-07 NOTE — Progress Notes (Signed)
Botox- 200 units x 1 vial Lot: [id number] Expiration: 12.2026 NDC: 0023-3921-02  Bacteriostatic 0.9% Sodium Chloride- 4 mL  Lot:[id number] Expiration: 12.2026 NDC: 5409811914  Dx: G43.709 S/P Witnessed by April, RN

## 2023-05-08 ENCOUNTER — Ambulatory Visit: Payer: Commercial Managed Care - PPO | Admitting: Sports Medicine

## 2023-05-08 ENCOUNTER — Other Ambulatory Visit: Payer: Self-pay

## 2023-05-08 ENCOUNTER — Other Ambulatory Visit (HOSPITAL_COMMUNITY): Payer: Self-pay

## 2023-05-08 ENCOUNTER — Other Ambulatory Visit (INDEPENDENT_AMBULATORY_CARE_PROVIDER_SITE_OTHER): Payer: Commercial Managed Care - PPO

## 2023-05-08 DIAGNOSIS — M170 Bilateral primary osteoarthritis of knee: Secondary | ICD-10-CM | POA: Diagnosis not present

## 2023-05-08 DIAGNOSIS — F3181 Bipolar II disorder: Secondary | ICD-10-CM | POA: Diagnosis not present

## 2023-05-08 DIAGNOSIS — M65312 Trigger thumb, left thumb: Secondary | ICD-10-CM | POA: Diagnosis not present

## 2023-05-08 MED ORDER — AMBULATORY NON FORMULARY MEDICATION
0 refills | Status: DC
Start: 2023-05-08 — End: 2023-05-12

## 2023-05-08 MED ORDER — TRAMADOL HCL 50 MG PO TABS
100.0000 mg | ORAL_TABLET | Freq: Three times a day (TID) | ORAL | 0 refills | Status: DC | PRN
Start: 2023-05-08 — End: 2024-03-30
  Filled 2023-05-08 (×3): qty 180, 30d supply, fill #0

## 2023-05-08 NOTE — Progress Notes (Addendum)
    Procedures performed today:    Procedure: Real-time Ultrasound Guided injection of the Left flexor pollicis longus tendon sheath Device: Samsung HS60  Verbal informed consent obtained.  Time-out conducted.  Noted no overlying erythema, induration, or other signs of local infection.  Skin prepped in a sterile fashion.  Local anesthesia: Topical Ethyl chloride.  With sterile technique and under real time ultrasound guidance: Noted flexor tendon nodule, 0.5 cc lidocaine, 0.5 cc kenalog 40 injected easily. Completed without difficulty  Advised to call if fevers/chills, erythema, induration, drainage, or persistent bleeding.  Images permanently stored and available for review in PACS.  Impression: Technically successful ultrasound guided injection.  Independent interpretation of notes and tests performed by another provider:   None.  Brief History, Exam, Impression, and Recommendations:    Trigger thumb, left thumb Stenosing tenosynovitis left flexor pollicis longus, today we did a FPL injection with ultrasound guidance, adding home PT and return to see me in 6 weeks.  Primary osteoarthritis of both knees End-stage refractory knee osteoarthritis, BMI over 40 so unable to have arthroplasty, she is now scheduled for geniculate artery embolization. I am going to add some additional tramadol to hold her over in the meantime.  Of note, she will need a rollator to help with ambulation, as well as an elevated toilet seat as she has significant weakness with going from sitting to standing.    ____________________________________________ Ihor Austin. Benjamin Stain, M.D., ABFM., CAQSM., [name]. Primary Care and Sports Medicine [facility]  Adjunct Professor of Family Medicine  [employer] [facility] [employer] [employer] [facility] [employer]  Restaurant manager, fast food

## 2023-05-08 NOTE — Assessment & Plan Note (Signed)
Stenosing tenosynovitis left flexor pollicis longus, today we did a FPL injection with ultrasound guidance, adding home PT and return to see me in 6 weeks.

## 2023-05-08 NOTE — Assessment & Plan Note (Addendum)
End-stage refractory knee osteoarthritis, BMI over 40 so unable to have arthroplasty, she is now scheduled for geniculate artery embolization. I am going to add some additional tramadol to hold her over in the meantime.  Of note, she will need a rollator to help with ambulation, as well as an elevated toilet seat as she has significant weakness with going from sitting to standing.

## 2023-05-09 ENCOUNTER — Other Ambulatory Visit (HOSPITAL_COMMUNITY): Payer: Self-pay

## 2023-05-09 ENCOUNTER — Telehealth: Payer: Self-pay | Admitting: Sports Medicine

## 2023-05-09 ENCOUNTER — Other Ambulatory Visit: Payer: Self-pay

## 2023-05-09 NOTE — Telephone Encounter (Signed)
To clinical staff.

## 2023-05-09 NOTE — Telephone Encounter (Signed)
Call [facility] in [place], [facility] is also an option.

## 2023-05-09 NOTE — Telephone Encounter (Signed)
Pt called back.  Is there any way this patient can get taken care of today? She is needing rx, office notes and demographic sheet for  rollator walker, elevated toilet seat with handle bar. She has a bad knee and painful when she bends it. Please fax number provided. Thanks.

## 2023-05-09 NOTE — Telephone Encounter (Signed)
Patient called she is having trouble finding a place to get her DME she has tried several places without any success. Please advise

## 2023-05-12 ENCOUNTER — Other Ambulatory Visit: Payer: Self-pay | Admitting: Sports Medicine

## 2023-05-12 ENCOUNTER — Other Ambulatory Visit (HOSPITAL_COMMUNITY): Payer: Self-pay | Admitting: Student

## 2023-05-12 ENCOUNTER — Encounter: Payer: Self-pay | Admitting: Sports Medicine

## 2023-05-12 DIAGNOSIS — M170 Bilateral primary osteoarthritis of knee: Secondary | ICD-10-CM

## 2023-05-12 MED ORDER — AMBULATORY NON FORMULARY MEDICATION
0 refills | Status: AC
Start: 2023-05-12 — End: ?

## 2023-05-14 ENCOUNTER — Other Ambulatory Visit: Payer: Self-pay | Admitting: Adult Health

## 2023-05-14 ENCOUNTER — Other Ambulatory Visit: Payer: Self-pay | Admitting: Radiology

## 2023-05-14 ENCOUNTER — Other Ambulatory Visit: Payer: Self-pay | Admitting: Family Medicine

## 2023-05-14 DIAGNOSIS — F339 Major depressive disorder, recurrent, unspecified: Secondary | ICD-10-CM

## 2023-05-14 DIAGNOSIS — F331 Major depressive disorder, recurrent, moderate: Secondary | ICD-10-CM

## 2023-05-14 DIAGNOSIS — F4310 Post-traumatic stress disorder, unspecified: Secondary | ICD-10-CM

## 2023-05-14 DIAGNOSIS — M25842 Other specified joint disorders, left hand: Secondary | ICD-10-CM | POA: Diagnosis not present

## 2023-05-14 DIAGNOSIS — F411 Generalized anxiety disorder: Secondary | ICD-10-CM

## 2023-05-14 DIAGNOSIS — F603 Borderline personality disorder: Secondary | ICD-10-CM

## 2023-05-14 DIAGNOSIS — F410 Panic disorder [episodic paroxysmal anxiety] without agoraphobia: Secondary | ICD-10-CM

## 2023-05-14 MED ORDER — RISPERIDONE 1 MG PO TABS
1.5000 mg | ORAL_TABLET | Freq: Every day | ORAL | 0 refills | Status: DC
  Filled 2023-05-14: qty 45, 30d supply, fill #0

## 2023-05-14 NOTE — Telephone Encounter (Signed)
Please schedule a follow-up for her.

## 2023-05-15 ENCOUNTER — Other Ambulatory Visit: Payer: Self-pay

## 2023-05-15 ENCOUNTER — Other Ambulatory Visit (HOSPITAL_COMMUNITY): Payer: Self-pay | Admitting: Interventional Radiology

## 2023-05-15 ENCOUNTER — Encounter (HOSPITAL_COMMUNITY): Payer: Self-pay

## 2023-05-15 ENCOUNTER — Other Ambulatory Visit (HOSPITAL_COMMUNITY): Payer: Self-pay

## 2023-05-15 ENCOUNTER — Ambulatory Visit (HOSPITAL_COMMUNITY)
Admission: RE | Admit: 2023-05-15 | Discharge: 2023-05-15 | Disposition: A | Discharge status: Home / Self Care | Payer: Commercial Managed Care - PPO | Source: Ambulatory Visit | Attending: Interventional Radiology | Admitting: Interventional Radiology

## 2023-05-15 DIAGNOSIS — M170 Bilateral primary osteoarthritis of knee: Secondary | ICD-10-CM

## 2023-05-15 DIAGNOSIS — I10 Essential (primary) hypertension: Secondary | ICD-10-CM | POA: Insufficient documentation

## 2023-05-15 DIAGNOSIS — Z7985 Long-term (current) use of injectable non-insulin antidiabetic drugs: Secondary | ICD-10-CM | POA: Diagnosis not present

## 2023-05-15 DIAGNOSIS — G43909 Migraine, unspecified, not intractable, without status migrainosus: Secondary | ICD-10-CM | POA: Insufficient documentation

## 2023-05-15 DIAGNOSIS — Z87891 Personal history of nicotine dependence: Secondary | ICD-10-CM | POA: Diagnosis not present

## 2023-05-15 DIAGNOSIS — Z794 Long term (current) use of insulin: Secondary | ICD-10-CM | POA: Insufficient documentation

## 2023-05-15 DIAGNOSIS — F319 Bipolar disorder, unspecified: Secondary | ICD-10-CM | POA: Insufficient documentation

## 2023-05-15 DIAGNOSIS — K7460 Unspecified cirrhosis of liver: Secondary | ICD-10-CM | POA: Insufficient documentation

## 2023-05-15 DIAGNOSIS — K589 Irritable bowel syndrome without diarrhea: Secondary | ICD-10-CM | POA: Diagnosis not present

## 2023-05-15 DIAGNOSIS — Z6841 Body Mass Index (BMI) 40.0 and over, adult: Secondary | ICD-10-CM | POA: Insufficient documentation

## 2023-05-15 DIAGNOSIS — E119 Type 2 diabetes mellitus without complications: Secondary | ICD-10-CM | POA: Insufficient documentation

## 2023-05-15 DIAGNOSIS — F4310 Post-traumatic stress disorder, unspecified: Secondary | ICD-10-CM | POA: Insufficient documentation

## 2023-05-15 HISTORY — PX: IR EMBO ARTERIAL NOT HEMORR HEMANG INC GUIDE ROADMAPPING: IMG5448

## 2023-05-15 HISTORY — PX: IR US GUIDE VASC ACCESS LEFT: IMG2389

## 2023-05-15 HISTORY — PX: IR ANGIOGRAM SELECTIVE EACH ADDITIONAL VESSEL: IMG667

## 2023-05-15 LAB — CBC
HCT: 39 % (ref 36.0–46.0)
Hemoglobin: 13.1 g/dL (ref 12.0–15.0)
MCH: 31.7 pg (ref 26.0–34.0)
MCHC: 33.6 g/dL (ref 30.0–36.0)
MCV: 94.4 fL (ref 80.0–100.0)
Platelets: 147 10*3/uL — ABNORMAL LOW (ref 150–400)
RBC: 4.13 MIL/uL (ref 3.87–5.11)
RDW: 13.8 % (ref 11.5–15.5)
WBC: 5.3 10*3/uL (ref 4.0–10.5)
nRBC: 0 % (ref 0.0–0.2)

## 2023-05-15 LAB — BASIC METABOLIC PANEL
Anion gap: 8 (ref 5–15)
BUN: 9 mg/dL (ref 6–20)
CO2: 24 mmol/L (ref 22–32)
Calcium: 9.5 mg/dL (ref 8.9–10.3)
Chloride: 105 mmol/L (ref 98–111)
Creatinine, Ser: 0.96 mg/dL (ref 0.44–1.00)
GFR, Estimated: >60 mL/min (ref >60)
Glucose, Bld: 83 mg/dL (ref 70–99)
Potassium: 3.7 mmol/L (ref 3.5–5.1)
Sodium: 137 mmol/L (ref 135–145)

## 2023-05-15 LAB — GLUCOSE, CAPILLARY: Glucose-Capillary: 84 mg/dL (ref 70–99)

## 2023-05-15 LAB — PROTIME-INR
INR: 1 (ref 0.8–1.2)
Prothrombin Time: 13.3 s (ref 11.4–15.2)

## 2023-05-15 MED ORDER — NITROGLYCERIN 1 MG/10 ML FOR IR/CATH LAB
INTRA_ARTERIAL | Status: AC | PRN
  Administered 2023-05-15: 200 ug

## 2023-05-15 MED ORDER — IODIXANOL 320 MG/ML IV SOLN
100.0000 mL | Freq: Once | INTRAVENOUS | Status: AC | PRN
  Administered 2023-05-15: 60 mL via INTRAVENOUS

## 2023-05-15 MED ORDER — NITROGLYCERIN 1 MG/10 ML FOR IR/CATH LAB
INTRA_ARTERIAL | Status: AC
  Filled 2023-05-15: qty 10

## 2023-05-15 MED ORDER — MIDAZOLAM HCL 2 MG/2ML IJ SOLN
INTRAMUSCULAR | Status: AC
  Filled 2023-05-15: qty 4

## 2023-05-15 MED ORDER — IODIXANOL 320 MG/ML IV SOLN
100.0000 mL | Freq: Once | INTRAVENOUS | Status: AC | PRN
  Administered 2023-05-15: 15 mL via INTRAVENOUS

## 2023-05-15 MED ORDER — LIDOCAINE-EPINEPHRINE 1 %-1:100000 IJ SOLN
INTRAMUSCULAR | Status: AC
  Filled 2023-05-15: qty 1

## 2023-05-15 MED ORDER — ACETAMINOPHEN 10 MG/ML IV SOLN
1000.0000 mg | Freq: Once | INTRAVENOUS | Status: AC
  Administered 2023-05-15: 1000 mg via INTRAVENOUS
  Filled 2023-05-15: qty 100

## 2023-05-15 MED ORDER — DIPHENHYDRAMINE HCL 50 MG/ML IJ SOLN
INTRAMUSCULAR | Status: AC
  Filled 2023-05-15: qty 1

## 2023-05-15 MED ORDER — MIDAZOLAM HCL 2 MG/2ML IJ SOLN
INTRAMUSCULAR | Status: AC | PRN
Start: 2023-05-15 — End: 2023-05-15
  Administered 2023-05-15 (×5): .5 mg via INTRAVENOUS

## 2023-05-15 MED ORDER — FENTANYL CITRATE (PF) 100 MCG/2ML IJ SOLN
INTRAMUSCULAR | Status: AC
  Filled 2023-05-15: qty 4

## 2023-05-15 MED ORDER — SODIUM CHLORIDE 0.9 % IV SOLN
INTRAVENOUS | Status: AC | PRN
  Administered 2023-05-15: 500 mL/h via INTRAVENOUS

## 2023-05-15 MED ORDER — KETOROLAC TROMETHAMINE 30 MG/ML IJ SOLN
30.0000 mg | INTRAMUSCULAR | Status: AC
  Administered 2023-05-15: 30 mg via INTRAVENOUS
  Filled 2023-05-15: qty 1

## 2023-05-15 MED ORDER — LIDOCAINE-EPINEPHRINE 1 %-1:100000 IJ SOLN
20.0000 mL | Freq: Once | INTRAMUSCULAR | Status: AC
  Administered 2023-05-15: 20 mL via INTRADERMAL

## 2023-05-15 MED ORDER — FENTANYL CITRATE (PF) 100 MCG/2ML IJ SOLN
INTRAMUSCULAR | Status: AC | PRN
Start: 2023-05-15 — End: 2023-05-15
  Administered 2023-05-15 (×5): 25 ug via INTRAVENOUS

## 2023-05-15 MED ORDER — IOHEXOL 300 MG/ML  SOLN
150.0000 mL | Freq: Once | INTRAMUSCULAR | Status: AC | PRN
  Administered 2023-05-15: 40 mL via INTRA_ARTERIAL

## 2023-05-15 MED ORDER — SODIUM CHLORIDE 0.9 % IV SOLN
INTRAVENOUS | Status: AC | PRN
Start: 2023-05-15 — End: 2023-05-15
  Administered 2023-05-15: 250 mL via INTRAVENOUS

## 2023-05-15 MED ORDER — SODIUM CHLORIDE 0.9 % IV SOLN: INTRAVENOUS | Status: DC

## 2023-05-15 NOTE — H&P (Signed)
Chief Complaint: Patient was seen in consultation today for  left geniculate artery embolization at the request of Dr Otila Back   Supervising Physician: Marliss Coots  Patient Status: [facility] - Out-pt  History of Present Illness: Tracy [name] is a 57 y.o. female   FULL Code status per pt Hx Bipolar; PTSD; migraines; DM2 HTN; IBS; Cirrhosis; obesity Bilateral osteoarthritis - knees Left worse than right  Was seen in consultation with Dr Elby Showers 04/16/23 She has been treated conservatively with steroid injections and physical therapy without much relief. She is not a candidate for arthroplasty due to her BMI being greater than 40. She has been referred to Interventional Radiology to discuss geniculate artery embolization.   Her pain started years ago and has progressively gotten worse  Pain is unresponsive to conservative treatments. She would be an excellent candidate for geniculate artery embolization.   Scheduled now for this procedure in IR   Past Medical History:  Diagnosis Date   Acanthosis nigricans, acquired 02/09/2015   Anxiety and depression 10/31/2014   Arthritis    back, knees, right elbow   Arthritis of left hip 06/25/2018   Bilateral swelling of feet and ankles    Bipolar disorder (HCC)    Borderline personality disorder (HCC)    Cancer (HCC)    oral lichen planus, uses mouth wash, cream   Carpal tunnel syndrome 09/06/2015   Chewing difficulty    Chronic left shoulder pain 05/17/2020   Constipation    Coronary artery disease 03/21/2021   COVID 10/08/2021   Cystic teratoma    BENIGN   Dental crowns present    Diabetes mellitus without complication (HCC)    Diabetes mellitus, type 2 (HCC) 06/10/2013   Diarrhea    Diverticulosis of colon without hemorrhage    DOE (dyspnea on exertion) 03/21/2021   Dyspepsia 12/14/2020   Essential hypertension 10/02/2007   Qualifier: Diagnosis of  By: Everardo All MD, Sean A    Facial rash 06/16/2019   Female  hirsutism 02/09/2015   Food allergy    Walnuts   GAD (generalized anxiety disorder)    Gastritis and gastroduodenitis    Gastroesophageal reflux disease    Grade II internal hemorrhoids    Greater trochanteric bursitis of left hip 07/24/2018   Hemorrhoids, internal 07/18/2017   History of kidney stones    History of migraine headaches    History of posttraumatic stress disorder (PTSD)    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/2015   Hypothyroidism    HYPOTHYROIDISM, POSTSURGICAL 10/02/2007   Qualifier: Diagnosis of  By: Everardo All MD, Sean A    IBS (irritable bowel syndrome)    Infertility associated with anovulation 02/09/2015   Insulin resistance 02/09/2015   Joint pain    Leg swelling 09/21/2020   Lichen planus    Liver cirrhosis secondary to NASH (nonalcoholic steatohepatitis) (HCC)    Lower back pain 07/18/2017   MDD (major depressive disorder) 06/06/2015   Migraine 10/02/2007   Morbid obesity with BMI of 40.0-44.9, adult (HCC) 06/23/2020   NASH (nonalcoholic steatohepatitis)    Nausea without vomiting    Nonalcoholic fatty liver disease 02/09/2015   Obstructive sleep apnea 06/27/2014   Oral mucosal lesion 06/17/2019   Osteoarthritis    Other fatigue    PCOS (polycystic ovarian syndrome)    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/02/2007   Qualifier: Diagnosis of  By: Everardo All MD, Sean A     Trigeminal neuralgia    Unilateral primary osteoarthritis, left hip 02/10/2019   Vitamin D deficiency     Past Surgical History:  Procedure Laterality Date   ACHILLES TENDON SURGERY Left 2007   APPENDECTOMY     CARPAL TUNNEL RELEASE Right 01/12/2016   Procedure: RIGHT CARPAL TUNNEL RELEASE;  Surgeon: Dominica Severin, MD;  Location: [facility];  Service: Orthopedics;  Laterality: Right;   COLONOSCOPY WITH PROPOFOL N/A 04/20/2014   Procedure: COLONOSCOPY WITH PROPOFOL;  Surgeon: Willis Modena, MD;  Location: [facility];  Service: Endoscopy;  Laterality: N/A;   COLONOSCOPY WITH PROPOFOL N/A 01/10/2021   Procedure: COLONOSCOPY WITH PROPOFOL;  Surgeon: Shellia Cleverly, DO;  Location: [facility];  Service: Gastroenterology;  Laterality: N/A;   ESOPHAGOGASTRODUODENOSCOPY (EGD) WITH PROPOFOL N/A 04/20/2014   Procedure: ESOPHAGOGASTRODUODENOSCOPY (EGD) WITH PROPOFOL;  Surgeon: Willis Modena, MD;  Location: [facility];  Service: Endoscopy;  Laterality: N/A;   ESOPHAGOGASTRODUODENOSCOPY (EGD) WITH PROPOFOL N/A 01/10/2021   Procedure: ESOPHAGOGASTRODUODENOSCOPY (EGD) WITH PROPOFOL;  Surgeon: Shellia Cleverly, DO;  Location: [facility];  Service: Gastroenterology;  Laterality: N/A;   EXCISION HAGLUND'S DEFORMITY WITH ACHILLES TENDON REPAIR Right 02/25/2013   Procedure: RIGHT ACHILLES DEBRIDEMENT AND RECONSTRUCTION;  HAGLUND'S EXCISION; GASTROC RECESSION AND FLEXOR HALLUCIS LONGUS TRANSFER;  Surgeon: Toni Arthurs, MD;  Location: [facility];  Service: Orthopedics;  Laterality: Right;   IR RADIOLOGIST EVAL & MGMT  04/16/2023   JOINT REPLACEMENT     hip   LUMBAR LAMINECTOMY     X 3   neck fusion     OVARIAN CYST SURGERY Left 1998   PILONIDAL CYST EXCISION  1994   RIGHT OOPHORECTOMY     SHOULDER ARTHROSCOPY WITH ROTATOR CUFF REPAIR Right 10/17/2022   Procedure: Right shoulder arthroscopy, debridement, subacromial decompression, distal clavicle resection, rotator cuff  repair;  Surgeon: Francena Hanly, MD;  Location: [facility];  Service: Orthopedics;  Laterality: Right;    STERIOD INJECTION Left 01/12/2016   Procedure: STEROID INJECTION LEFT WRIST;  Surgeon: Dominica Severin, MD;  Location: [facility];  Service: Orthopedics;  Laterality: Left;   TOTAL HIP ARTHROPLASTY Left 03/19/2019   Procedure: LEFT TOTAL HIP ARTHROPLASTY ANTERIOR APPROACH;  Surgeon: Kathryne Hitch, MD;  Location: [facility];  Service: Orthopedics;  Laterality: Left;   TOTAL THYROIDECTOMY  2003   UPPER GI ENDOSCOPY  01/19/2015    Allergies: Sulfa antibiotics, Food, Bupropion hcl, Codeine, and Sulfamethoxazole-trimethoprim  Medications: Prior to Admission medications   Medication Sig Start Date End Date Taking? Authorizing Provider  AMBULATORY NON FORMULARY MEDICATION Rollator for use daily. Elevated toilet seat with handlebars 05/12/23   Monica Becton, MD  Armodafinil 200 MG TABS Take 1 tablet (200 mg total) by mouth daily as needed (sleepiness). 03/14/23   Mozingo, Thereasa Solo, NP  Atogepant (QULIPTA) 60 MG TABS Take 1 tablet (60 mg total) by mouth daily. 03/20/23   Levert Feinstein, MD  Blood Glucose Monitoring Suppl (FREESTYLE LITE) w/Device KIT Use as directed. 01/01/22   Monica Becton, MD  Blood Glucose Monitoring Suppl (FREESTYLE LITE) w/Device KIT Check fasting blood sugar every morning and before meals up to 4 times daily. 01/01/22   Everrett Coombe, DO  botulinum toxin Type A (BOTOX) 200 units injection Inject 200 Units into the muscle every 3 (three) months. 03/31/23   Levert Feinstein, MD  clobetasol (TEMOVATE) 0.05 % GEL Apply small amount to affected area twice daily using finger. Gently pat dry with cotton gauze.  Do not eat,drink, or speak for 30 minutes to allow absorption. 03/21/22     clonazePAM (KLONOPIN) 1 MG tablet Take 1 tablet (1 mg total) by mouth 3 (three) times daily. 04/16/23   Mozingo, Thereasa Solo, NP  Continuous Glucose Sensor (DEXCOM G6  SENSOR) MISC Replace every 10 days.  Use to check blood sugar. 03/19/23   Everrett Coombe, DO  Continuous Glucose Transmitter (DEXCOM G6 TRANSMITTER) MISC Use to check continuous blood sugar.  Replace every 90 days 03/19/23   Everrett Coombe, DO  cyclobenzaprine (FLEXERIL) 10 MG tablet Take 1 tablet (10 mg total) by mouth 3 (three) times daily as needed for muscle spasms. 10/17/22   Shuford, French Ana, PA-C  diclofenac Sodium (VOLTAREN) 1 % GEL Apply 1 Application topically 4 (four) times daily as needed (pain).    [provider]  doxycycline (VIBRA-TABS) 100 MG tablet Take 1 tablet (100 mg total) by mouth 2 (two) times daily. 03/28/23   Everrett Coombe, DO  ferrous sulfate 325 (65 FE) MG EC tablet Take 1 tablet (325 mg total) by mouth in the morning and at bedtime. Take iron supplement 2 hours before or 4 hours after PPI or other antacids as this requires some level of gastric acidity to aid in absorption Patient taking differently: Take 325 mg by mouth daily. Take iron supplement 2 hours before or 4 hours after PPI or other antacids as this requires some level of gastric acidity to aid in absorption 09/05/22   Monica Becton, MD  fluocinonide gel (LIDEX) 0.05 % Apply topically 2 (two) times daily Gently pat the affected area(s) dry with cotton gauze. Place a small amount of gel on a finger and apply to ulcerations. No eating , drinking or speaking for 20 - 30 minutes to allow steroid to be absorbed. Gel can also be applied to 2x2 gauze placed against the affected area. 05/01/23     folic acid (FOLVITE) 1 MG tablet Take 1 tablet (1 mg total) by mouth daily. 03/11/23   Everrett Coombe, DO  furosemide (LASIX) 40 MG tablet Take 1 tablet (40 mg total) by mouth daily. 01/13/23   Everrett Coombe, DO  glucose blood (FREESTYLE LITE) test strip Use as instructed 02/11/21   Couture, Cortni S, PA-C  glucose blood (FREESTYLE LITE) test strip Check fasting blood sugar every morning and before meals up to 4 times a day  as directed. 01/01/22   Everrett Coombe, DO  hydrOXYzine (ATARAX) 25 MG tablet Take 1 tablet (25 mg total) by mouth 3 (three) times daily as needed for anxiety. 06/24/22   Mozingo, Thereasa Solo, NP  insulin lispro (HUMALOG KWIKPEN) 100 UNIT/ML KwikPen Inject up to 16 units 3 times daily at mealtime per sliding scale. Patient taking differently: Inject 3-16 Units into the skin 3 (three) times daily as needed (when taking steroids). 05/07/22   Everrett Coombe, DO  Insulin Pen Needle (UNIFINE PENTIPS) 31G X 5 MM MISC Use as directed  to inject insulin 01/28/22   Everrett Coombe, DO  ketorolac (TORADOL) 10 MG tablet Take 1 tablet (10 mg total) by mouth every 8 (eight) hours as needed. 01/10/23   Monica Becton, MD  lamoTRIgine (LAMICTAL) 200 MG tablet Take 2 tablets by mouth at bedtime. 08/12/22   Mozingo, Thereasa Solo, NP  Lancets 30G MISC Check fasting blood sugar every morning and before meals up to 4 times daily. 01/01/22   Everrett Coombe, DO  lidocaine (LIDODERM) 5 % Place 1 patch onto the skin every 12 (twelve) hours. Remove & Discard patch within 12 hours or as directed by MD 11/28/22   Charlton Amor, DO  lidocaine (XYLOCAINE) 5 % ointment Apply topically to affected areas 3 times daily as needed. 02/18/22   Everrett Coombe, DO  lisinopril (ZESTRIL) 10 MG tablet Take 1 tablet (10 mg total) by mouth daily. 04/26/22   Everrett Coombe, DO  meloxicam (MOBIC) 15 MG tablet Take 1 tablet by mouth once every morning with a meal for 2 weeks, then daily as needed for pain. 10/17/22   Shuford, French Ana, PA-C  Menthol, Topical Analgesic, (BENGAY EX) Apply 1 Application topically daily as needed (pain).    [provider]  neomycin-bacitracin-polymyxin [phone] OINT Apply topically 2 (two) times daily for 10 days To corners of mouth Patient taking differently: Apply 1 Application topically 2 (two) times daily as needed (cracking/ raw skin around mouth). 01/17/22     nystatin (MYCOSTATIN) 100000 UNIT/ML  suspension Use 5ml to swish in mouth and swallow --use 4 times daily for 10 days 09/26/22     nystatin [facility]) powder Apply 3 times daily as directed. Patient taking differently: Apply 1 application  topically 3 (three) times daily as needed (rash). 11/27/21   Everrett Coombe, DO  nystatin ointment (MYCOSTATIN) Apply to the skin 2 times daily to corners of mouth Patient taking differently: Apply 1 Application topically 2 (two) times daily as needed (raw/cracking skin around the mouth). 01/17/22     ondansetron (ZOFRAN) 4 MG tablet Take 1 tablet (4 mg total) by mouth every 8 (eight) hours as needed for nausea or vomiting. 10/17/22   Shuford, French Ana, PA-C  ondansetron (ZOFRAN-ODT) 4 MG disintegrating tablet Dissolve 1 tablet (4 mg total) by mouth 2 (two) times daily as needed for nausea or vomiting. 08/30/21   Everrett Coombe, DO  pantoprazole (PROTONIX) 40 MG tablet Take 2 tablets (80 mg total) by mouth 2 (two) times daily. 04/17/23   Everrett Coombe, DO  pimecrolimus (ELIDEL) 1 % cream Apply a small amount to affected area 2 x daily. Gently pat area dry w/cotton gauze. No eating, drinking or speaking for 20 - 30 minutes to allow medicine to be absorbed. Can apply to 2x2 gauze & placed against the affected area. Patient taking differently: Apply 1 Application topically 2 (two) times daily as needed (ulcers). 01/17/22     pregabalin (LYRICA) 75 MG capsule Take 1 capsule (75 mg total) by mouth 3 (three) times daily. 03/14/23   Monica Becton, MD  Rimegepant Sulfate (NURTEC) 75 MG TBDP Dissolve 1 tablet (75 mg total) by mouth once as needed for up to 1 dose (migraines). 04/16/23   Everrett Coombe, DO  risperiDONE (RISPERDAL) 1 MG tablet Take 1.5 tablets (1.5 mg total) by mouth at bedtime. 05/14/23   Corie Chiquito, PMHNP  rosuvastatin (CRESTOR) 20 MG tablet TAKE 1 TABLET BY MOUTH DAILY. 04/26/22 04/26/23  Everrett Coombe, DO  sertraline (ZOLOFT) 100 MG tablet Take 2 tablets (200 mg  total) by mouth every morning.  06/24/22 07/10/23  Mozingo, Thereasa Solo, NP  spironolactone (ALDACTONE) 50 MG tablet Take 1 tablet (50 mg total) by mouth 2 (two) times daily. 06/26/22   Everrett Coombe, DO  SYNTHROID 200 MCG tablet Take 1 tablet (200 mcg total) by mouth daily before breakfast. 03/20/23   Everrett Coombe, DO  tirzepatide [facility]) 12.5 MG/0.5ML Pen Inject 12.5 mg into the skin once a week. 04/01/23   Everrett Coombe, DO  traMADol (ULTRAM) 50 MG tablet Take 2 tablets (100 mg total) by mouth every 8 (eight) hours as needed for moderate pain. 05/08/23   Monica Becton, MD     Family History  Problem Relation Age of Onset   Heart disease Father    Depression Father    Heart attack Father    Sudden death Father    Anxiety disorder Father    Bipolar disorder Father    Obesity Father    Cancer Brother        THROAT cancer   Depression Brother    Anxiety disorder Brother    Depression Sister    Breast cancer Mother    Hypertension Mother    Cancer Mother    Depression Mother    Sleep apnea Mother    Obesity Mother    Depression Maternal Grandmother    Stroke Maternal Grandfather    Colon cancer Brother    Pancreatic cancer Neg Hx    Stomach cancer Neg Hx    Liver disease Neg Hx     Social History   Socioeconomic History   Marital status: Divorced    Spouse name: Not on file   Number of children: 0   Years of education: 16   Highest education level: Bachelor's degree (e.g., BA, AB, BS)  Occupational History   Occupation: RN-Telephone Triage Nurse    Employer: [employer]    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   Drug use: No   Sexual activity: Not Currently    Partners: Male    Birth control/protection: Condom  Other Topics Concern   Not on file  Social History Narrative   RN - [employer]   Regular exercise: no   Caffeine use: 2 x daily   Right-handed.   Lives alone.   Social Determinants of Health   Financial Resource Strain: Medium Risk (12/22/2022)   Overall Financial Resource Strain (CARDIA)    Difficulty of Paying Living Expenses: Somewhat hard  Food Insecurity: No Food Insecurity (12/22/2022)   Hunger Vital Sign    Worried About Running Out of Food in the Last Year: Never true    Ran Out of Food in the Last Year: Never true  Transportation Needs: No Transportation Needs (12/22/2022)   PRAPARE - Administrator, Civil Service (Medical): No    Lack of Transportation (Non-Medical): No  Physical Activity: Unknown (12/22/2022)   Exercise  Vital Sign    Days of Exercise per Week: 0 days    Minutes of Exercise per Session: Not on file  Stress: Stress Concern Present (12/22/2022)   [employer] of Occupational Health - Occupational Stress Questionnaire    Feeling of Stress : Very much  Social Connections: Socially Isolated (12/22/2022)   Social Connection and Isolation Panel [NHANES]    Frequency of Communication with Friends and Family: Three times a week    Frequency of Social Gatherings with Friends and Family: Never    Attends Religious Services: Never    Database administrator or Organizations: No    Attends Engineer, structural: Not on file    Marital Status: Divorced    Review of Systems: A 12 point ROS discussed and pertinent positives are indicated in the HPI above.  All other systems are negative.  Review of Systems  Constitutional:  Positive for activity change. Negative for fever.  Respiratory:  Negative for cough and shortness of  breath.   Cardiovascular:  Negative for chest pain.  Gastrointestinal:  Negative for abdominal pain.  Musculoskeletal:  Positive for arthralgias and gait problem.  Neurological:  Negative for weakness.  Psychiatric/Behavioral:  Negative for behavioral problems and confusion.     Vital Signs: BP 105/63   Pulse 70   Temp 97.6 F (36.4 C) (Temporal)   Resp 18   Ht 5\' 11"  (1.803 m)   Wt 300 lb (136.1 kg)   LMP 08/24/2020   SpO2 98%   BMI 41.84 kg/m     Physical Exam Vitals reviewed.  Constitutional:      Appearance: She is obese.  HENT:     Mouth/Throat:     Mouth: Mucous membranes are moist.  Cardiovascular:     Rate and Rhythm: Normal rate and regular rhythm.     Heart sounds: Normal heart sounds.  Pulmonary:     Effort: Pulmonary effort is normal.     Breath sounds: Normal breath sounds. No wheezing.  Abdominal:     Palpations: Abdomen is soft.     Tenderness: There is no abdominal tenderness.  Musculoskeletal:        General: Normal range of motion.  Skin:    General: Skin is warm.  Neurological:     Mental Status: She is alert and oriented to person, place, and time.  Psychiatric:        Behavior: Behavior normal.     Imaging: [place] LIMITED JOINT SPACE STRUCTURES UP LEFT  Result Date: 05/08/2023 Procedure: Real-time Ultrasound Guided injection of the Left flexor pollicis longus tendon sheath Device: Samsung [id number] Verbal informed consent obtained. Time-out conducted. Noted no overlying erythema, induration, or other signs of local infection. Skin prepped in a sterile fashion. Local anesthesia: Topical Ethyl chloride. With sterile technique and under real time ultrasound guidance: Noted flexor tendon nodule, 0.5 cc lidocaine, 0.5 cc kenalog 40 injected easily. Completed without difficulty Advised to call if fevers/chills, erythema, induration, drainage, or persistent bleeding. Images permanently stored and available for review in PACS. Impression: Technically  successful ultrasound guided injection.   IR Radiologist Eval & Mgmt  Result Date: 04/16/2023 EXAM: NEW PATIENT OFFICE VISIT CHIEF COMPLAINT: See Epic note. HISTORY OF PRESENT ILLNESS: See Epic note. REVIEW OF SYSTEMS: See Epic note. PHYSICAL EXAMINATION: See Epic note. ASSESSMENT AND PLAN: See Epic note. Marliss Coots, MD [facility] Electronically Signed   By: Marliss Coots M.D.   On: 04/16/2023 15:33  Labs:  CBC: Recent Labs    10/11/22 1450 03/28/23 0938  WBC 6.0 4.8  HGB 13.5 14.3  HCT 40.1 42.8  PLT 152 157    COAGS: No results for input(s): "INR", "APTT" in the last 8760 hours.  BMP: Recent Labs    10/11/22 1450 11/28/22 1509 03/28/23 0938  NA 137 140 143  K 3.4* 3.5 4.3  CL 106 103 103  CO2 22 27 27   GLUCOSE 80 137* 78  BUN 11 14 11   CALCIUM 8.8* 9.8 9.4  CREATININE 0.69 0.95 0.88  GFRNONAA >60  --   --     LIVER FUNCTION TESTS: Recent Labs    10/11/22 1450 03/28/23 0938  BILITOT 0.5 0.3  AST 24 20  ALT 28 20  ALKPHOS 102 118  PROT 7.1 6.8  ALBUMIN 3.7 4.4    TUMOR MARKERS: No results for input(s): "AFPTM", "CEA", "CA199", "CHROMGRNA" in the last 8760 hours.  Assessment and Plan:  Scheduled for left geniculate artery embolization Pt is aware of procedure benefits and risks including but not limited to Infection; bleeding; vessel damage; damage to surrounding structures Agreeable to proceed Consent signed and in chart   Thank you for this interesting consult.  I greatly enjoyed meeting Tracy [name] and look forward to participating in their care.  A copy of this report was sent to the requesting provider on this date.  Electronically Signed: Robet Leu, PA-C 05/15/2023, 10:58 AM   I spent a total of  30 Minutes   in face to face in clinical consultation, greater than 50% of which was counseling/coordinating care for left geniculate artery embolization

## 2023-05-15 NOTE — Procedures (Signed)
Interventional Radiology Procedure Note  Procedure: Left geniculate artery embolization  Findings: Please refer to procedural dictation for full description. 100-300 micron particle embolization of inferior lateral geniculate artery which supplied the medial and lateral femoral condyles.  Left posterior tibial artery access, 4 Fr.  Manual compression for hemostasis.  Complications: None immediate  Estimated Blood Loss: < 5 ml  Recommendations: 2 hour bedrest. IR will arrange outpatient follow up in 1 month.   Marliss Coots, MD

## 2023-05-16 ENCOUNTER — Other Ambulatory Visit: Payer: Self-pay

## 2023-05-16 ENCOUNTER — Other Ambulatory Visit (HOSPITAL_COMMUNITY): Payer: Self-pay

## 2023-05-16 MED ORDER — SPIRONOLACTONE 50 MG PO TABS
50.0000 mg | ORAL_TABLET | Freq: Two times a day (BID) | ORAL | 2 refills | Status: DC
  Filled 2023-05-16: qty 180, 90d supply, fill #0
  Filled 2023-10-09: qty 180, 90d supply, fill #1
  Filled 2024-01-03: qty 180, 90d supply, fill #2

## 2023-05-19 ENCOUNTER — Other Ambulatory Visit: Payer: Self-pay | Admitting: Interventional Radiology

## 2023-05-19 DIAGNOSIS — M1712 Unilateral primary osteoarthritis, left knee: Secondary | ICD-10-CM

## 2023-05-20 DIAGNOSIS — F3181 Bipolar II disorder: Secondary | ICD-10-CM | POA: Diagnosis not present

## 2023-05-22 ENCOUNTER — Other Ambulatory Visit: Payer: Self-pay | Admitting: Adult Health

## 2023-05-22 ENCOUNTER — Other Ambulatory Visit: Payer: Self-pay

## 2023-05-22 DIAGNOSIS — F410 Panic disorder [episodic paroxysmal anxiety] without agoraphobia: Secondary | ICD-10-CM

## 2023-05-22 NOTE — Telephone Encounter (Signed)
Please call to schedule an appt, no show/cancel last one.

## 2023-05-26 NOTE — Telephone Encounter (Signed)
Call to schedule FU. Did not respond to MyChart message.

## 2023-05-28 ENCOUNTER — Other Ambulatory Visit (HOSPITAL_COMMUNITY): Payer: Self-pay

## 2023-05-28 MED ORDER — CLONAZEPAM 1 MG PO TABS
1.0000 mg | ORAL_TABLET | Freq: Three times a day (TID) | ORAL | 0 refills | Status: DC
  Filled 2023-05-28: qty 24, 8d supply, fill #0

## 2023-05-29 ENCOUNTER — Other Ambulatory Visit: Payer: Self-pay

## 2023-05-30 DIAGNOSIS — F3181 Bipolar II disorder: Secondary | ICD-10-CM | POA: Diagnosis not present

## 2023-06-02 ENCOUNTER — Encounter: Payer: Self-pay | Admitting: Sports Medicine

## 2023-06-02 DIAGNOSIS — M170 Bilateral primary osteoarthritis of knee: Secondary | ICD-10-CM

## 2023-06-05 ENCOUNTER — Other Ambulatory Visit: Payer: Self-pay

## 2023-06-05 DIAGNOSIS — L438 Other lichen planus: Secondary | ICD-10-CM | POA: Diagnosis not present

## 2023-06-05 DIAGNOSIS — F3181 Bipolar II disorder: Secondary | ICD-10-CM | POA: Diagnosis not present

## 2023-06-05 DIAGNOSIS — K0510 Chronic gingivitis, plaque induced: Secondary | ICD-10-CM | POA: Diagnosis not present

## 2023-06-05 DIAGNOSIS — K117 Disturbances of salivary secretion: Secondary | ICD-10-CM | POA: Diagnosis not present

## 2023-06-06 ENCOUNTER — Encounter: Payer: Self-pay | Admitting: Adult Health

## 2023-06-06 ENCOUNTER — Telehealth (INDEPENDENT_AMBULATORY_CARE_PROVIDER_SITE_OTHER): Payer: Commercial Managed Care - PPO | Admitting: Adult Health

## 2023-06-06 ENCOUNTER — Other Ambulatory Visit (HOSPITAL_COMMUNITY): Payer: Self-pay

## 2023-06-06 ENCOUNTER — Encounter: Payer: Self-pay | Admitting: Obstetrics and Gynecology

## 2023-06-06 DIAGNOSIS — F410 Panic disorder [episodic paroxysmal anxiety] without agoraphobia: Secondary | ICD-10-CM

## 2023-06-06 DIAGNOSIS — F411 Generalized anxiety disorder: Secondary | ICD-10-CM

## 2023-06-06 DIAGNOSIS — F603 Borderline personality disorder: Secondary | ICD-10-CM

## 2023-06-06 DIAGNOSIS — F339 Major depressive disorder, recurrent, unspecified: Secondary | ICD-10-CM

## 2023-06-06 DIAGNOSIS — F331 Major depressive disorder, recurrent, moderate: Secondary | ICD-10-CM

## 2023-06-06 DIAGNOSIS — F4310 Post-traumatic stress disorder, unspecified: Secondary | ICD-10-CM | POA: Diagnosis not present

## 2023-06-06 DIAGNOSIS — G4733 Obstructive sleep apnea (adult) (pediatric): Secondary | ICD-10-CM

## 2023-06-06 MED ORDER — RISPERIDONE 1 MG PO TABS
1.5000 mg | ORAL_TABLET | Freq: Every day | ORAL | 2 refills | Status: DC
  Filled 2023-06-06 – 2023-06-18 (×2): qty 45, 30d supply, fill #0
  Filled 2023-07-21: qty 45, 30d supply, fill #1
  Filled 2023-09-03: qty 45, 30d supply, fill #2

## 2023-06-06 MED ORDER — HYDROXYZINE HCL 25 MG PO TABS
25.0000 mg | ORAL_TABLET | Freq: Three times a day (TID) | ORAL | 3 refills | Status: DC | PRN
  Filled 2023-06-06: qty 90, 30d supply, fill #0
  Filled 2023-10-16: qty 90, 30d supply, fill #1
  Filled 2024-01-14: qty 90, 30d supply, fill #2

## 2023-06-06 MED ORDER — CLONAZEPAM 1 MG PO TABS
1.0000 mg | ORAL_TABLET | Freq: Three times a day (TID) | ORAL | 2 refills | Status: DC
  Filled 2023-06-06: qty 90, 30d supply, fill #0
  Filled 2023-07-21: qty 90, 30d supply, fill #1
  Filled 2023-08-31: qty 90, 30d supply, fill #2

## 2023-06-06 MED ORDER — TRAZODONE HCL 50 MG PO TABS
50.0000 mg | ORAL_TABLET | Freq: Every day | ORAL | 2 refills | Status: DC
Start: 2023-06-06 — End: 2023-09-07
  Filled 2023-07-12: qty 30, 30d supply, fill #0
  Filled 2023-07-22 – 2023-08-05 (×2): qty 30, 30d supply, fill #1

## 2023-06-06 MED ORDER — SERTRALINE HCL 100 MG PO TABS
200.0000 mg | ORAL_TABLET | Freq: Every morning | ORAL | 3 refills | Status: DC
  Filled 2023-06-06 – 2023-06-25 (×2): qty 180, 90d supply, fill #0
  Filled 2023-07-21 – 2023-10-09 (×2): qty 180, 90d supply, fill #1
  Filled 2024-01-14: qty 180, 90d supply, fill #2

## 2023-06-06 NOTE — Progress Notes (Signed)
Tracy [name] [id number] 24-Aug-1965 57 y.o.  Virtual Visit via Video Note  I connected with pt @ on 06/06/23 at  4:20 PM EDT by a video enabled telemedicine application and verified that I am speaking with the correct person using two identifiers.   I discussed the limitations of evaluation and management by telemedicine and the availability of in person appointments. The patient expressed understanding and agreed to proceed.  I discussed the assessment and treatment plan with the patient. The patient was provided an opportunity to ask questions and all were answered. The patient agreed with the plan and demonstrated an understanding of the instructions.   The patient was advised to call back or seek an in-person evaluation if the symptoms worsen or if the condition fails to improve as anticipated.  I provided 25 minutes of non-face-to-face time during this encounter.  The patient was located at home.  The provider was located at [facility].   Dorothyann Gibbs, NP   Subjective:   Patient ID:  Tracy [name] is a 57 y.o. (DOB Jan 15, 1966) female.  Chief Complaint: No chief complaint on file.   HPI TAYLI BUCH presents for follow-up of MDD, GAD, PTSD, and Panic attacks.  Describes mood today as "not too good". Pleasant. Reports tearfulness. Mood symptoms - reports depression, anxiety and irritability. Denies recent panic attacks. Reports worry, rumination, and over thinking. Mood is lower. Stating "I'm not feeling too good". Feels like medications are helpful, but would like to consider other options.  Reports struggling with knee pain - upcoming knee replacement. Seeing therapist regularly. Decreased interest and motivation. Taking medications as prescribed. Energy levels lower. Active, does not have a regular exercise routine. Walking dogs. Enjoys some usual interests and activities. Lives alone with 3 dogs (shares with ex). Recently divorced. Siblings local. Spending  time with family. Attends church. Appetite adequate. Weight stable - 310 pounds.  Reports difficulties with sleep. Averages 3 to 4 - sometimes 5 hours. Sleep apnea - using CPAP machine - taking Nuvigil 200mg  in the morning.  Denies SI or HI. Denies AH or VH.  Denies self harm. Denies substance use.  Working with [facility] - therapy.  Previous medication trials:  Antipsychotics: Vraylar-TD, Latuda, Abilify, Geodon, Rexulti, Lybalvi, Risperdal  Mood Stabilizers - Depakote, Lithium, Lamictal, Equetro, Topamax, Trileptal, Gabapentin  SSRI - Zoloft, Lexapro, Celexa, Prozac, Viibryd, Trintellix, Prozac  SNRI - Pristiq, Effexor, Cymbalta  Wellbutrin - allergic reaction  Anti-anxiety - Buspar, Xanax, Clonazepam, Ativan, Valium  Sleep agents - Trazadone, Ambien, Restoril   Other - Deplin, Emsam, Serzone, Doxepin, Nuvigil, Vistaril  Previous treatment: ECT and TMS   Review of Systems:  Review of Systems  Musculoskeletal:  Negative for gait problem.  Neurological:  Negative for tremors.  Psychiatric/Behavioral:         Please refer to HPI    Medications: I have reviewed the patient's current medications.  Current Outpatient Medications  Medication Sig Dispense Refill   AMBULATORY NON FORMULARY MEDICATION Rollator for use daily. Elevated toilet seat with handlebars 1 each 0   Armodafinil 200 MG TABS Take 1 tablet (200 mg total) by mouth daily as needed (sleepiness). 90 tablet 0   Atogepant (QULIPTA) 60 MG TABS Take 1 tablet (60 mg total) by mouth daily. 30 tablet 11   Blood Glucose Monitoring Suppl (FREESTYLE LITE) w/Device KIT Use as directed. 1 kit 0   Blood Glucose Monitoring Suppl (FREESTYLE LITE) w/Device KIT Check fasting blood sugar every morning and before meals  up to 4 times daily. 1 kit PRN   botulinum toxin Type A (BOTOX) 200 units injection Inject 200 Units into the muscle every 3 (three) months. 1 each 2   clobetasol (TEMOVATE) 0.05 % GEL Apply small amount to  affected area twice daily using finger. Gently pat dry with cotton gauze.  Do not eat,drink, or speak for 30 minutes to allow absorption. 30 g 2   clonazePAM (KLONOPIN) 1 MG tablet Take 1 tablet (1 mg total) by mouth 3 (three) times daily. 24 tablet 0   Continuous Glucose Sensor (DEXCOM G6 SENSOR) MISC Replace every 10 days.  Use to check blood sugar. 9 each 1   Continuous Glucose Transmitter (DEXCOM G6 TRANSMITTER) MISC Use to check continuous blood sugar.  Replace every 90 days 1 each 3   cyclobenzaprine (FLEXERIL) 10 MG tablet Take 1 tablet (10 mg total) by mouth 3 (three) times daily as needed for muscle spasms. 30 tablet 1   diclofenac Sodium (VOLTAREN) 1 % GEL Apply 1 Application topically 4 (four) times daily as needed (pain).     doxycycline (VIBRA-TABS) 100 MG tablet Take 1 tablet (100 mg total) by mouth 2 (two) times daily. 20 tablet 0   ferrous sulfate 325 (65 FE) MG EC tablet Take 1 tablet (325 mg total) by mouth in the morning and at bedtime. Take iron supplement 2 hours before or 4 hours after PPI or other antacids as this requires some level of gastric acidity to aid in absorption (Patient taking differently: Take 325 mg by mouth daily. Take iron supplement 2 hours before or 4 hours after PPI or other antacids as this requires some level of gastric acidity to aid in absorption) 60 tablet 5   fluocinonide gel (LIDEX) 0.05 % Apply topically 2 (two) times daily Gently pat the affected area(s) dry with cotton gauze. Place a small amount of gel on a finger and apply to ulcerations. No eating , drinking or speaking for 20 - 30 minutes to allow steroid to be absorbed. Gel can also be applied to 2x2 gauze placed against the affected area. 30 g 0   folic acid (FOLVITE) 1 MG tablet Take 1 tablet (1 mg total) by mouth daily. 90 tablet 1   furosemide (LASIX) 40 MG tablet Take 1 tablet (40 mg total) by mouth daily. 30 tablet 3   glucose blood (FREESTYLE LITE) test strip Use as instructed 100 each 12    glucose blood (FREESTYLE LITE) test strip Check fasting blood sugar every morning and before meals up to 4 times a day as directed. 100 strip 12   hydrOXYzine (ATARAX) 25 MG tablet Take 1 tablet (25 mg total) by mouth 3 (three) times daily as needed for anxiety. 90 tablet 3   insulin lispro (HUMALOG KWIKPEN) 100 UNIT/ML KwikPen Inject up to 16 units 3 times daily at mealtime per sliding scale. (Patient taking differently: Inject 3-16 Units into the skin 3 (three) times daily as needed (when taking steroids).) 15 mL 11   Insulin Pen Needle (UNIFINE PENTIPS) 31G X 5 MM MISC Use as directed to inject insulin 100 each 3   ketorolac (TORADOL) 10 MG tablet Take 1 tablet (10 mg total) by mouth every 8 (eight) hours as needed. 20 tablet 0   lamoTRIgine (LAMICTAL) 200 MG tablet Take 2 tablets by mouth at bedtime. 180 tablet 1   Lancets 30G MISC Check fasting blood sugar every morning and before meals up to 4 times daily. 100 each 12  lidocaine (LIDODERM) 5 % Place 1 patch onto the skin every 12 (twelve) hours. Remove & Discard patch within 12 hours or as directed by MD 30 patch 0   lidocaine (XYLOCAINE) 5 % ointment Apply topically to affected areas 3 times daily as needed. 35.44 g 0   lisinopril (ZESTRIL) 10 MG tablet Take 1 tablet (10 mg total) by mouth daily. 90 tablet 3   meloxicam (MOBIC) 15 MG tablet Take 1 tablet by mouth once every morning with a meal for 2 weeks, then daily as needed for pain. 30 tablet 0   Menthol, Topical Analgesic, (BENGAY EX) Apply 1 Application topically daily as needed (pain).     neomycin-bacitracin-polymyxin [phone] OINT Apply topically 2 (two) times daily for 10 days To corners of mouth (Patient taking differently: Apply 1 Application topically 2 (two) times daily as needed (cracking/ raw skin around mouth).) 14 g 2   nystatin (MYCOSTATIN) 100000 UNIT/ML suspension Use 5ml to swish in mouth and swallow --use 4 times daily for 10 days 180 mL 0   nystatin (NYAMYC) powder  Apply 3 times daily as directed. (Patient taking differently: Apply 1 application  topically 3 (three) times daily as needed (rash).) 60 g 3   nystatin ointment (MYCOSTATIN) Apply to the skin 2 times daily to corners of mouth (Patient taking differently: Apply 1 Application topically 2 (two) times daily as needed (raw/cracking skin around the mouth).) 15 g 2   ondansetron (ZOFRAN) 4 MG tablet Take 1 tablet (4 mg total) by mouth every 8 (eight) hours as needed for nausea or vomiting. 10 tablet 0   ondansetron (ZOFRAN-ODT) 4 MG disintegrating tablet Dissolve 1 tablet (4 mg total) by mouth 2 (two) times daily as needed for nausea or vomiting. 20 tablet 1   pantoprazole (PROTONIX) 40 MG tablet Take 2 tablets (80 mg total) by mouth 2 (two) times daily. 60 tablet 3   pimecrolimus (ELIDEL) 1 % cream Apply a small amount to affected area 2 x daily. Gently pat area dry w/cotton gauze. No eating, drinking or speaking for 20 - 30 minutes to allow medicine to be absorbed. Can apply to 2x2 gauze & placed against the affected area. (Patient taking differently: Apply 1 Application topically 2 (two) times daily as needed (ulcers).) 60 g 3   pregabalin (LYRICA) 75 MG capsule Take 1 capsule (75 mg total) by mouth 3 (three) times daily. 90 capsule 3   Rimegepant Sulfate (NURTEC) 75 MG TBDP Dissolve 1 tablet (75 mg total) by mouth once as needed for up to 1 dose (migraines). 16 tablet 3   risperiDONE (RISPERDAL) 1 MG tablet Take 1.5 tablets (1.5 mg total) by mouth at bedtime. 45 tablet 0   rosuvastatin (CRESTOR) 20 MG tablet TAKE 1 TABLET BY MOUTH DAILY. 90 tablet 3   sertraline (ZOLOFT) 100 MG tablet Take 2 tablets (200 mg total) by mouth every morning. 180 tablet 3   spironolactone (ALDACTONE) 50 MG tablet Take 1 tablet (50 mg total) by mouth 2 (two) times daily. 180 tablet 2   SYNTHROID 200 MCG tablet Take 1 tablet (200 mcg total) by mouth daily before breakfast. 90 tablet 1   tirzepatide (MOUNJARO) 12.5 MG/0.5ML Pen  Inject 12.5 mg into the skin once a week. 6 mL 0   traMADol (ULTRAM) 50 MG tablet Take 2 tablets (100 mg total) by mouth every 8 (eight) hours as needed for moderate pain. 180 tablet 0   Current Facility-Administered Medications  Medication Dose Route Frequency Provider Last Rate  Last Admin   botulinum toxin Type A (BOTOX) injection 155 Units  155 Units Intramuscular Once Levert Feinstein, MD        Medication Side Effects: None  Allergies:  Allergies  Allergen Reactions   Sulfa Antibiotics Itching, Swelling and Rash   Food     Walnut-mouth swelling/itching   Bupropion Hcl Itching and Rash   Codeine Itching   Sulfamethoxazole-Trimethoprim Rash    Past Medical History:  Diagnosis Date   Acanthosis nigricans, acquired 02/09/2015   Anxiety and depression 10/31/2014   Arthritis    back, knees, right elbow   Arthritis of left hip 06/25/2018   Bilateral swelling of feet and ankles    Bipolar disorder (HCC)    Borderline personality disorder (HCC)    Cancer (HCC)    oral lichen planus, uses mouth wash, cream   Carpal tunnel syndrome 09/06/2015   Chewing difficulty    Chronic left shoulder pain 05/17/2020   Constipation    Coronary artery disease 03/21/2021   COVID 10/08/2021   Cystic teratoma    BENIGN   Dental crowns present    Diabetes mellitus without complication (HCC)    Diabetes mellitus, type 2 (HCC) 06/10/2013   Diarrhea    Diverticulosis of colon without hemorrhage    DOE (dyspnea on exertion) 03/21/2021   Dyspepsia 12/14/2020   Essential hypertension 10/02/2007   Qualifier: Diagnosis of  By: Everardo All MD, Sean A    Facial rash 06/16/2019   Female hirsutism 02/09/2015   Food allergy    Walnuts   GAD (generalized anxiety disorder)    Gastritis and gastroduodenitis    Gastroesophageal reflux disease    Grade II internal hemorrhoids    Greater trochanteric bursitis of left hip 07/24/2018   Hemorrhoids, internal 07/18/2017   History of kidney stones    History of  migraine headaches    History of posttraumatic stress disorder (PTSD)    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/2015   Hypothyroidism    HYPOTHYROIDISM, POSTSURGICAL 10/02/2007   Qualifier: Diagnosis of  By: Everardo All MD, Sean A    IBS (irritable bowel syndrome)    Infertility associated with anovulation 02/09/2015   Insulin resistance 02/09/2015   Joint pain    Leg swelling 09/21/2020   Lichen planus    Liver cirrhosis secondary to NASH (nonalcoholic steatohepatitis) (HCC)    Lower back pain 07/18/2017   MDD (major depressive disorder) 06/06/2015   Migraine 10/02/2007   Morbid obesity with BMI of 40.0-44.9, adult (HCC) 06/23/2020   NASH (nonalcoholic steatohepatitis)    Nausea without vomiting    Nonalcoholic fatty liver disease 02/09/2015   Obstructive sleep apnea 06/27/2014   Oral mucosal lesion 06/17/2019   Osteoarthritis    Other fatigue    PCOS (polycystic ovarian syndrome)    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   Qualifier: Diagnosis of  By: Everardo All MD, Sean A    Trigeminal neuralgia    Unilateral primary osteoarthritis, left hip 02/10/2019   Vitamin D deficiency     Family History  Problem Relation Age of Onset   Heart disease Father    Depression Father    Heart attack Father    Sudden death Father    Anxiety disorder Father    Bipolar disorder Father    Obesity Father     Cancer Brother        THROAT cancer   Depression Brother    Anxiety disorder Brother    Depression Sister    Breast cancer Mother    Hypertension Mother    Cancer Mother    Depression Mother    Sleep apnea Mother    Obesity Mother    Depression Maternal Grandmother    Stroke Maternal Grandfather    Colon cancer Brother    Pancreatic cancer Neg Hx    Stomach cancer Neg Hx    Liver disease Neg Hx     Social History   Socioeconomic History   Marital status: Divorced    Spouse name: Not on file   Number of children: 0   Years of education: 16   Highest education level: Bachelor's degree (e.g., BA, AB, BS)  Occupational History   Occupation: RN-Telephone Triage Nurse    Employer: [employer]    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   Drug use: No   Sexual activity: Not Currently    Partners: Male    Birth control/protection: Condom  Other Topics Concern   Not on file  Social History Narrative   RN - [employer]   Regular exercise: no   Caffeine use: 2 x daily   Right-handed.   Lives alone.   Social Determinants of Health   Financial Resource Strain: Medium Risk (12/22/2022)   Overall Financial Resource Strain (CARDIA)    Difficulty of Paying Living Expenses: Somewhat hard  Food Insecurity: No Food Insecurity (12/22/2022)   Hunger Vital Sign    Worried About Running Out of Food in the Last Year: Never true    Ran Out of Food in the Last Year: Never true  Transportation Needs:  No Transportation Needs (12/22/2022)   PRAPARE - Administrator, Civil Service (Medical): No    Lack of Transportation (Non-Medical): No  Physical Activity: Unknown (12/22/2022)   Exercise Vital Sign    Days of Exercise per Week: 0 days    Minutes of Exercise per Session: Not on file  Stress: Stress Concern Present (12/22/2022)   [employer] of Occupational Health - Occupational Stress Questionnaire    Feeling of Stress : Very much  Social Connections: Socially Isolated (12/22/2022)   Social Connection and Isolation Panel [NHANES]    Frequency of Communication with Friends and Family: Three times a week    Frequency of Social Gatherings with  Friends and Family: Never    Attends Religious Services: Never    Database administrator or Organizations: No    Attends Engineer, structural: Not on file    Marital Status: Divorced  Intimate Partner Violence: Unknown (11/09/2021)   Received from [employer], [facility]   HITS    Physically Hurt: Not on file    Insult or Talk Down To: Not on file    Threaten Physical Harm: Not on file    Scream or Curse: Not on file    Past Medical History, Surgical history, Social history, and Family history were reviewed and updated as appropriate.   Please see review of systems for further details on the patient's review from today.   Objective:   Physical Exam:  LMP 08/24/2020   Physical Exam Constitutional:      General: She is not in acute distress. Musculoskeletal:        General: No deformity.  Neurological:     Mental Status: She is alert and oriented to person, place, and time.     Coordination: Coordination normal.  Psychiatric:        Attention and Perception: Attention and perception normal. She does not perceive auditory or visual hallucinations.        Mood and Affect: Affect is not labile, blunt, angry or inappropriate.        Speech: Speech normal.        Behavior: Behavior normal.        Thought  Content: Thought content normal. Thought content is not paranoid or delusional. Thought content does not include homicidal or suicidal ideation. Thought content does not include homicidal or suicidal plan.        Cognition and Memory: Cognition and memory normal.        Judgment: Judgment normal.     Comments: Insight intact     Lab Review:     Component Value Date/Time   NA 137 05/15/2023 1017   NA 143 03/28/2023 0938   K 3.7 05/15/2023 1017   CL 105 05/15/2023 1017   CO2 24 05/15/2023 1017   GLUCOSE 83 05/15/2023 1017   BUN 9 05/15/2023 1017   BUN 11 03/28/2023 0938   CREATININE 0.96 05/15/2023 1017   CREATININE 0.95 11/28/2022 1509   CALCIUM 9.5 05/15/2023 1017   PROT 6.8 03/28/2023 0938   ALBUMIN 4.4 03/28/2023 0938   AST 20 03/28/2023 0938   ALT 20 03/28/2023 0938   ALKPHOS 118 03/28/2023 0938   BILITOT 0.3 03/28/2023 0938   GFRNONAA >60 05/15/2023 1017   GFRNONAA 99 12/26/2020 0000   GFRAA 115 12/26/2020 0000       Component Value Date/Time   WBC 5.3 05/15/2023 1017   RBC 4.13 05/15/2023 1017   HGB 13.1 05/15/2023 1017   HGB 14.3 03/28/2023 0938   HCT 39.0 05/15/2023 1017   HCT 42.8 03/28/2023 0938   PLT 147 (L) 05/15/2023 1017   PLT 157 03/28/2023 0938   MCV 94.4 05/15/2023 1017   MCV 94 03/28/2023 0938   MCH 31.7 05/15/2023 1017   MCHC 33.6 05/15/2023 1017   RDW 13.8 05/15/2023 1017   RDW 14.4 03/28/2023 0938   LYMPHSABS 1.6 03/28/2023 0938   MONOABS 0.3 02/11/2021 1516   EOSABS 0.1 03/28/2023 0938   BASOSABS 0.0 03/28/2023 0938    No results found for: "POCLITH", "LITHIUM"   No results found for: "PHENYTOIN", "PHENOBARB", "VALPROATE", "CBMZ"   .res Assessment: Plan:    Plan:  PDMP reviewed  Risperdal 1.5 mg at hs  Lamictal - 2 - 200mg  daily Zoloft 100mg  - 2 daily Nuvigil 200mg  daily - takes every day Clonazepam 1mg  TID as needed for anxiety Hydroxyzine 25mg  TID - using 1 tablet at bedtime.   Add Trazadone 50mg  - take 1/2 to one tablet  at bedtime  Time spent with patient was 25 minutes. Greater than 50% of face to face time with patient was spent on counseling and coordination of care.      [facility] - therapist  RTC 3 months  Patient advised to contact office with any questions, adverse effects, or acute worsening in signs and symptoms.  Counseled patient regarding potential benefits, risks, and side effects of Lamictal to include potential risk of Stevens-Johnson syndrome. Advised patient to stop taking Lamictal and contact office immediately if rash develops and to seek urgent medical attention if rash is severe and/or spreading quickly.   Discussed potential benefits, risk, and side effects of benzodiazepines to include potential risk of tolerance and dependence, as well as possible drowsiness. Advised patient not to drive if experiencing drowsiness and to take lowest possible effective dose to minimize risk of dependence and tolerance.  Discussed potential metabolic side effects associated with atypical antipsychotics, as well as potential risk for movement side effects. Advised pt to contact office if movement side effects occur.    There are no diagnoses linked to this encounter.   Please see After Visit Summary for patient specific instructions.  Future Appointments  Date Time Provider Department Center  06/06/2023  4:20 PM Debora Stockdale, Thereasa Solo, NP [facility] None  06/19/2023  3:15 PM Monica Becton, MD [facility] None  08/07/2023  3:15 PM Ihor Austin, NP [facility] None  10/03/2023  9:10 AM Everrett Coombe, DO [facility] None    No orders of the defined types were placed in this encounter.     -------------------------------

## 2023-06-07 ENCOUNTER — Other Ambulatory Visit (HOSPITAL_COMMUNITY): Payer: Self-pay

## 2023-06-10 ENCOUNTER — Telehealth: Payer: Self-pay | Admitting: *Deleted

## 2023-06-10 ENCOUNTER — Encounter (HOSPITAL_COMMUNITY): Payer: Self-pay

## 2023-06-10 ENCOUNTER — Other Ambulatory Visit (HOSPITAL_COMMUNITY): Payer: Self-pay

## 2023-06-10 ENCOUNTER — Other Ambulatory Visit: Payer: Self-pay

## 2023-06-10 NOTE — Telephone Encounter (Signed)
Left patient a message to call and schedule appointment per Dr. Jolayne Panther.  Please schedule appointment with provider to discuss management options of menopausal symptoms  This can be a virtual appointment if the patient is willing

## 2023-06-11 ENCOUNTER — Other Ambulatory Visit: Payer: Self-pay

## 2023-06-12 ENCOUNTER — Encounter: Payer: Self-pay | Admitting: Pharmacist

## 2023-06-12 ENCOUNTER — Other Ambulatory Visit: Payer: Self-pay

## 2023-06-12 ENCOUNTER — Encounter: Payer: Self-pay | Admitting: Family Medicine

## 2023-06-12 ENCOUNTER — Other Ambulatory Visit (HOSPITAL_COMMUNITY): Payer: Self-pay

## 2023-06-12 MED ORDER — FLUCONAZOLE 100 MG PO TABS
100.0000 mg | ORAL_TABLET | Freq: Every day | ORAL | 0 refills | Status: DC
  Filled 2023-06-12: qty 3, 3d supply, fill #0

## 2023-06-13 DIAGNOSIS — M25562 Pain in left knee: Secondary | ICD-10-CM | POA: Insufficient documentation

## 2023-06-13 DIAGNOSIS — M1712 Unilateral primary osteoarthritis, left knee: Secondary | ICD-10-CM | POA: Diagnosis not present

## 2023-06-18 ENCOUNTER — Other Ambulatory Visit (HOSPITAL_COMMUNITY): Payer: Self-pay

## 2023-06-18 ENCOUNTER — Other Ambulatory Visit: Payer: Self-pay

## 2023-06-18 ENCOUNTER — Other Ambulatory Visit: Payer: Self-pay | Admitting: Family Medicine

## 2023-06-18 MED ORDER — FUROSEMIDE 40 MG PO TABS
40.0000 mg | ORAL_TABLET | Freq: Every day | ORAL | 3 refills | Status: DC
  Filled 2023-06-18: qty 30, 30d supply, fill #0
  Filled 2023-07-21: qty 30, 30d supply, fill #1
  Filled 2023-08-31: qty 30, 30d supply, fill #2
  Filled 2023-10-09: qty 30, 30d supply, fill #3

## 2023-06-19 ENCOUNTER — Ambulatory Visit: Payer: Commercial Managed Care - PPO | Admitting: Sports Medicine

## 2023-06-19 DIAGNOSIS — F3181 Bipolar II disorder: Secondary | ICD-10-CM | POA: Diagnosis not present

## 2023-06-20 ENCOUNTER — Ambulatory Visit: Payer: Commercial Managed Care - PPO | Admitting: Sports Medicine

## 2023-06-20 ENCOUNTER — Encounter: Payer: Self-pay | Admitting: Family Medicine

## 2023-06-20 NOTE — Telephone Encounter (Signed)
Patient scheduled.

## 2023-06-25 ENCOUNTER — Other Ambulatory Visit: Payer: Self-pay

## 2023-07-08 ENCOUNTER — Other Ambulatory Visit (HOSPITAL_COMMUNITY): Payer: Self-pay

## 2023-07-08 ENCOUNTER — Other Ambulatory Visit: Payer: Self-pay

## 2023-07-08 ENCOUNTER — Other Ambulatory Visit: Payer: Self-pay | Admitting: Family Medicine

## 2023-07-08 DIAGNOSIS — F3181 Bipolar II disorder: Secondary | ICD-10-CM | POA: Diagnosis not present

## 2023-07-08 MED ORDER — LISINOPRIL 10 MG PO TABS
10.0000 mg | ORAL_TABLET | Freq: Every day | ORAL | 3 refills | Status: DC
  Filled 2023-07-08: qty 90, 90d supply, fill #0

## 2023-07-11 ENCOUNTER — Encounter (HOSPITAL_COMMUNITY): Payer: Self-pay

## 2023-07-11 ENCOUNTER — Other Ambulatory Visit: Payer: Self-pay

## 2023-07-11 ENCOUNTER — Telehealth: Payer: Commercial Managed Care - PPO | Admitting: Family Medicine

## 2023-07-11 ENCOUNTER — Other Ambulatory Visit (HOSPITAL_COMMUNITY): Payer: Self-pay

## 2023-07-11 DIAGNOSIS — J029 Acute pharyngitis, unspecified: Secondary | ICD-10-CM

## 2023-07-11 DIAGNOSIS — J0100 Acute maxillary sinusitis, unspecified: Secondary | ICD-10-CM | POA: Diagnosis not present

## 2023-07-11 MED ORDER — AMOXICILLIN-POT CLAVULANATE 875-125 MG PO TABS
1.0000 | ORAL_TABLET | Freq: Two times a day (BID) | ORAL | 0 refills | Status: DC
Start: 2023-07-11 — End: 2023-09-04
  Filled 2023-07-11: qty 20, 10d supply, fill #0

## 2023-07-11 NOTE — Progress Notes (Signed)
Virtual Visit Consent   Tracy [name], you are scheduled for a virtual visit with a [facility] provider today. Just as with appointments in the office, your consent must be obtained to participate. Your consent will be active for this visit and any virtual visit you may have with one of our providers in the next 365 days. If you have a MyChart account, a copy of this consent can be sent to you electronically.  As this is a virtual visit, video technology does not allow for your provider to perform a traditional examination. This may limit your provider's ability to fully assess your condition. If your provider identifies any concerns that need to be evaluated in person or the need to arrange testing (such as labs, EKG, etc.), we will make arrangements to do so. Although advances in technology are sophisticated, we cannot ensure that it will always work on either your end or our end. If the connection with a video visit is poor, the visit may have to be switched to a telephone visit. With either a video or telephone visit, we are not always able to ensure that we have a secure connection.  By engaging in this virtual visit, you consent to the provision of healthcare and authorize for your insurance to be billed (if applicable) for the services provided during this visit. Depending on your insurance coverage, you may receive a charge related to this service.  I need to obtain your verbal consent now. Are you willing to proceed with your visit today? Tracy [name] has provided verbal consent on 07/11/2023 for a virtual visit (video or telephone). Georgana Curio, FNP  Date: 07/11/2023 2:14 PM  Virtual Visit via Video Note   I, Georgana Curio, connected with  Tracy [name]  ([id number], 05-15-1966) on 07/11/23 at  2:15 PM EST by a video-enabled telemedicine application and verified that I am speaking with the correct person using two identifiers.  Location: Patient: Virtual Visit Location Patient:  Home Provider: Virtual Visit Location Provider: Home Office   I discussed the limitations of evaluation and management by telemedicine and the availability of in person appointments. The patient expressed understanding and agreed to proceed.    History of Present Illness: Tracy [name] is a 57 y.o. who identifies as a female who was assigned female at birth, and is being seen today for sinus pressure and pain with post nasal drainage and ulcers in throat. No cough. No fever. Mucus is green and yellow. Marland Kitchen  HPI: HPI  Problems:  Patient Active Problem List   Diagnosis Date Noted   Trigger thumb, left thumb 05/08/2023   Well adult exam 03/28/2023   Boil 03/28/2023   Osteoarthritis of thumb, left 03/22/2023   Morbid obesity (HCC) 03/20/2023   Primary osteoarthritis of both knees 01/10/2023   Non-recurrent acute suppurative otitis media of left ear without spontaneous rupture of tympanic membrane 08/22/2022   Localized, primary osteoarthritis of the ankle and foot, left 07/31/2022   Constipation 08/09/2021   Dental crowns present 08/09/2021   Diabetes mellitus without complication (HCC) 08/09/2021   Food allergy 08/09/2021   History of kidney stones 08/09/2021   History of migraine headaches 08/09/2021   Hypothyroidism 08/09/2021   IBS (irritable bowel syndrome) 08/09/2021   Lichen planus 08/09/2021   Liver cirrhosis secondary to NASH (nonalcoholic steatohepatitis) (HCC) 08/09/2021   NASH (nonalcoholic steatohepatitis) 08/09/2021   Other fatigue 08/09/2021   PONV (postoperative nausea and vomiting) 08/09/2021   Coronary artery disease 03/21/2021  Gastroesophageal reflux disease    Gastritis and gastroduodenitis    Diverticulosis of colon without hemorrhage    Grade II internal hemorrhoids    Morbid obesity with BMI of 40.0-44.9, adult (HCC) 06/23/2020   Chronic left shoulder pain 05/17/2020   Oral mucosal lesion 06/17/2019   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    Vitamin D deficiency 01/04/2018   Lower back pain 07/18/2017   Hemorrhoids, internal 07/18/2017   Trigeminal neuralgia 01/18/2016   Bipolar disorder (HCC) 11/28/2015   Borderline personality disorder (HCC) 11/28/2015   Carpal tunnel syndrome 09/06/2015   MDD (major depressive disorder) 06/06/2015   Severe episode of recurrent major depressive disorder, without psychotic features (HCC)    History of posttraumatic stress disorder (PTSD)    GAD (generalized anxiety disorder)    Female hirsutism 02/09/2015   Anxiety and depression 10/31/2014   Obstructive sleep apnea 06/27/2014   Diabetes mellitus, type 2 (HCC) 06/10/2013   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 Elevated toilet seat with handlebars, Disp: 1 each, Rfl: 0   Armodafinil 200 MG TABS, Take 1 tablet (200 mg total) by mouth daily as needed (sleepiness)., Disp: 90 tablet, Rfl: 0   Atogepant (QULIPTA) 60 MG TABS, Take 1 tablet (60 mg total) by mouth daily., Disp: 30 tablet, Rfl: 11   Blood Glucose Monitoring Suppl (FREESTYLE LITE) w/Device KIT, Use as directed., Disp: 1 kit, Rfl: 0   Blood Glucose Monitoring Suppl (FREESTYLE LITE) w/Device KIT, Check fasting blood sugar every morning and before meals up to 4 times daily., Disp: 1 kit, Rfl: PRN   botulinum toxin Type A (BOTOX) 200 units injection, Inject 200 Units into the muscle every 3 (three) months., Disp: 1 each, Rfl: 2    clobetasol (TEMOVATE) 0.05 % GEL, Apply small amount to affected area twice daily using finger. Gently pat dry with cotton gauze.  Do not eat,drink, or speak for 30 minutes to allow absorption., Disp: 30 g, Rfl: 2   clonazePAM (KLONOPIN) 1 MG tablet, Take 1 tablet (1 mg total) by mouth 3 (three) times daily., Disp: 90 tablet, Rfl: 2   Continuous Glucose Sensor (DEXCOM G6 SENSOR) MISC, Replace every 10 days.  Use to check blood sugar., Disp: 9 each, Rfl: 1   Continuous Glucose Transmitter (DEXCOM G6 TRANSMITTER) MISC, Use to check continuous blood sugar.  Replace every 90 days, Disp: 1 each, Rfl: 3   cyclobenzaprine (FLEXERIL) 10 MG tablet, Take 1 tablet (10 mg total) by mouth 3 (three) times daily as needed for muscle spasms., Disp: 30 tablet, Rfl: 1   diclofenac Sodium (VOLTAREN) 1 % GEL, Apply 1 Application topically 4 (four) times daily as needed (pain)., Disp: , Rfl:    doxycycline (VIBRA-TABS) 100 MG tablet, Take 1 tablet (100 mg total) by mouth 2 (two) times daily., Disp: 20 tablet, Rfl: 0   ferrous sulfate 325 (65 FE) MG EC tablet, Take 1 tablet (325 mg total) by  mouth in the morning and at bedtime. Take iron supplement 2 hours before or 4 hours after PPI or other antacids as this requires some level of gastric acidity to aid in absorption (Patient taking differently: Take 325 mg by mouth daily. Take iron supplement 2 hours before or 4 hours after PPI or other antacids as this requires some level of gastric acidity to aid in absorption), Disp: 60 tablet, Rfl: 5   fluconazole (DIFLUCAN) 100 MG tablet, Take 1 tablet (100 mg total) by mouth daily., Disp: 3 tablet, Rfl: 0   fluocinonide gel (LIDEX) 0.05 %, Apply topically 2 (two) times daily Gently pat the affected area(s) dry with cotton gauze. Place a small amount of gel on a finger and apply to ulcerations. No eating , drinking or speaking for 20 - 30 minutes to allow steroid to be absorbed. Gel can also be applied to 2x2 gauze placed against the  affected area., Disp: 30 g, Rfl: 0   folic acid (FOLVITE) 1 MG tablet, Take 1 tablet (1 mg total) by mouth daily., Disp: 90 tablet, Rfl: 1   furosemide (LASIX) 40 MG tablet, Take 1 tablet (40 mg total) by mouth daily., Disp: 30 tablet, Rfl: 3   glucose blood (FREESTYLE LITE) test strip, Use as instructed, Disp: 100 each, Rfl: 12   glucose blood (FREESTYLE LITE) test strip, Check fasting blood sugar every morning and before meals up to 4 times a day as directed., Disp: 100 strip, Rfl: 12   hydrOXYzine (ATARAX) 25 MG tablet, Take 1 tablet (25 mg total) by mouth 3 (three) times daily as needed for anxiety., Disp: 90 tablet, Rfl: 3   insulin lispro (HUMALOG KWIKPEN) 100 UNIT/ML KwikPen, Inject up to 16 units 3 times daily at mealtime per sliding scale. (Patient taking differently: Inject 3-16 Units into the skin 3 (three) times daily as needed (when taking steroids).), Disp: 15 mL, Rfl: 11   Insulin Pen Needle (UNIFINE PENTIPS) 31G X 5 MM MISC, Use as directed to inject insulin, Disp: 100 each, Rfl: 3   ketorolac (TORADOL) 10 MG tablet, Take 1 tablet (10 mg total) by mouth every 8 (eight) hours as needed., Disp: 20 tablet, Rfl: 0   lamoTRIgine (LAMICTAL) 200 MG tablet, Take 2 tablets by mouth at bedtime., Disp: 180 tablet, Rfl: 1   Lancets 30G MISC, Check fasting blood sugar every morning and before meals up to 4 times daily., Disp: 100 each, Rfl: 12   lidocaine (LIDODERM) 5 %, Place 1 patch onto the skin every 12 (twelve) hours. Remove & Discard patch within 12 hours or as directed by MD, Disp: 30 patch, Rfl: 0   lidocaine (XYLOCAINE) 5 % ointment, Apply topically to affected areas 3 times daily as needed., Disp: 35.44 g, Rfl: 0   lisinopril (ZESTRIL) 10 MG tablet, Take 1 tablet (10 mg total) by mouth daily., Disp: 90 tablet, Rfl: 3   meloxicam (MOBIC) 15 MG tablet, Take 1 tablet by mouth once every morning with a meal for 2 weeks, then daily as needed for pain., Disp: 30 tablet, Rfl: 0   Menthol,  Topical Analgesic, (BENGAY EX), Apply 1 Application topically daily as needed (pain)., Disp: , Rfl:    neomycin-bacitracin-polymyxin [phone] OINT, Apply topically 2 (two) times daily for 10 days To corners of mouth (Patient taking differently: Apply 1 Application topically 2 (two) times daily as needed (cracking/ raw skin around mouth).), Disp: 14 g, Rfl: 2   nystatin (MYCOSTATIN) 100000 UNIT/ML suspension, Use 5ml to swish in  mouth and swallow --use 4 times daily for 10 days, Disp: 180 mL, Rfl: 0   nystatin (NYAMYC) powder, Apply 3 times daily as directed. (Patient taking differently: Apply 1 application  topically 3 (three) times daily as needed (rash).), Disp: 60 g, Rfl: 3   nystatin ointment (MYCOSTATIN), Apply to the skin 2 times daily to corners of mouth (Patient taking differently: Apply 1 Application topically 2 (two) times daily as needed (raw/cracking skin around the mouth).), Disp: 15 g, Rfl: 2   ondansetron (ZOFRAN) 4 MG tablet, Take 1 tablet (4 mg total) by mouth every 8 (eight) hours as needed for nausea or vomiting., Disp: 10 tablet, Rfl: 0   ondansetron (ZOFRAN-ODT) 4 MG disintegrating tablet, Dissolve 1 tablet (4 mg total) by mouth 2 (two) times daily as needed for nausea or vomiting., Disp: 20 tablet, Rfl: 1   pantoprazole (PROTONIX) 40 MG tablet, Take 2 tablets (80 mg total) by mouth 2 (two) times daily., Disp: 60 tablet, Rfl: 3   pimecrolimus (ELIDEL) 1 % cream, Apply a small amount to affected area 2 x daily. Gently pat area dry w/cotton gauze. No eating, drinking or speaking for 20 - 30 minutes to allow medicine to be absorbed. Can apply to 2x2 gauze & placed against the affected area. (Patient taking differently: Apply 1 Application topically 2 (two) times daily as needed (ulcers).), Disp: 60 g, Rfl: 3   pregabalin (LYRICA) 75 MG capsule, Take 1 capsule (75 mg total) by mouth 3 (three) times daily., Disp: 90 capsule, Rfl: 3   Rimegepant Sulfate (NURTEC) 75 MG TBDP, Dissolve 1  tablet (75 mg total) by mouth once as needed for up to 1 dose (migraines)., Disp: 16 tablet, Rfl: 3   risperiDONE (RISPERDAL) 1 MG tablet, Take 1.5 tablets (1.5 mg total) by mouth at bedtime., Disp: 45 tablet, Rfl: 2   rosuvastatin (CRESTOR) 20 MG tablet, TAKE 1 TABLET BY MOUTH DAILY., Disp: 90 tablet, Rfl: 3   sertraline (ZOLOFT) 100 MG tablet, Take 2 tablets (200 mg total) by mouth every morning., Disp: 180 tablet, Rfl: 3   spironolactone (ALDACTONE) 50 MG tablet, Take 1 tablet (50 mg total) by mouth 2 (two) times daily., Disp: 180 tablet, Rfl: 2   SYNTHROID 200 MCG tablet, Take 1 tablet (200 mcg total) by mouth daily before breakfast., Disp: 90 tablet, Rfl: 1   tirzepatide (MOUNJARO) 12.5 MG/0.5ML Pen, Inject 12.5 mg into the skin once a week., Disp: 6 mL, Rfl: 0   traMADol (ULTRAM) 50 MG tablet, Take 2 tablets (100 mg total) by mouth every 8 (eight) hours as needed for moderate pain., Disp: 180 tablet, Rfl: 0   traZODone (DESYREL) 50 MG tablet, Take 1 tablet (50 mg total) by mouth at bedtime., Disp: 30 tablet, Rfl: 2  Current Facility-Administered Medications:    botulinum toxin Type A (BOTOX) injection 155 Units, 155 Units, Intramuscular, Once, Levert Feinstein, MD  Observations/Objective: Patient is well-developed, well-nourished in no acute distress.  Resting comfortably  at home.  Head is normocephalic, atraumatic.  No labored breathing.  Speech is clear and coherent with logical content.  Patient is alert and oriented at baseline.    Assessment and Plan: 1. Acute maxillary sinusitis, recurrence not specified  2. Pharyngitis, unspecified etiology  Increase fluids, wamr salt water gargles, tylenol or ibuprofen as directed, UC if sx woren.   Follow Up Instructions: I discussed the assessment and treatment plan with the patient. The patient was provided an opportunity to ask questions and all were answered.  The patient agreed with the plan and demonstrated an understanding of the  instructions.  A copy of instructions were sent to the patient via MyChart unless otherwise noted below.     The patient was advised to call back or seek an in-person evaluation if the symptoms worsen or if the condition fails to improve as anticipated.    Georgana Curio, FNP

## 2023-07-11 NOTE — Patient Instructions (Signed)

## 2023-07-12 ENCOUNTER — Other Ambulatory Visit (HOSPITAL_COMMUNITY): Payer: Self-pay

## 2023-07-14 ENCOUNTER — Other Ambulatory Visit (HOSPITAL_BASED_OUTPATIENT_CLINIC_OR_DEPARTMENT_OTHER): Payer: Self-pay

## 2023-07-21 ENCOUNTER — Other Ambulatory Visit (HOSPITAL_COMMUNITY): Payer: Self-pay

## 2023-07-21 ENCOUNTER — Other Ambulatory Visit: Payer: Self-pay | Admitting: Family Medicine

## 2023-07-21 ENCOUNTER — Other Ambulatory Visit: Payer: Self-pay

## 2023-07-22 ENCOUNTER — Other Ambulatory Visit: Payer: Self-pay | Admitting: Family Medicine

## 2023-07-22 ENCOUNTER — Other Ambulatory Visit (HOSPITAL_COMMUNITY): Payer: Self-pay

## 2023-07-22 ENCOUNTER — Other Ambulatory Visit (HOSPITAL_COMMUNITY): Payer: Self-pay | Admitting: Pharmacy Technician

## 2023-07-22 ENCOUNTER — Other Ambulatory Visit: Payer: Self-pay

## 2023-07-22 NOTE — Progress Notes (Signed)
Specialty Pharmacy Refill Coordination Note  Tracy [name] is a 57 y.o. female contacted today regarding refills of specialty medication(s) OnabotulinumtoxinA (BOTOX)   Patient requested Courier to Provider Office   Delivery date: 08/04/23   Verified address: [facility] [address] [place]   Medication will be filled on 08/01/23.

## 2023-07-24 ENCOUNTER — Other Ambulatory Visit: Payer: Self-pay

## 2023-07-24 ENCOUNTER — Other Ambulatory Visit (HOSPITAL_COMMUNITY): Payer: Self-pay

## 2023-07-24 MED ORDER — PANTOPRAZOLE SODIUM 40 MG PO TBEC
80.0000 mg | DELAYED_RELEASE_TABLET | Freq: Two times a day (BID) | ORAL | 3 refills | Status: DC
  Filled 2023-07-24: qty 120, 30d supply, fill #0
  Filled 2023-08-31: qty 120, 30d supply, fill #1

## 2023-07-24 MED ORDER — MOUNJARO 12.5 MG/0.5ML ~~LOC~~ SOAJ
12.5000 mg | SUBCUTANEOUS | 0 refills | Status: DC
  Filled 2023-07-24: qty 6, 84d supply, fill #0

## 2023-07-25 ENCOUNTER — Other Ambulatory Visit: Payer: Self-pay

## 2023-07-25 ENCOUNTER — Other Ambulatory Visit (HOSPITAL_COMMUNITY): Payer: Self-pay

## 2023-08-01 ENCOUNTER — Other Ambulatory Visit: Payer: Self-pay

## 2023-08-04 ENCOUNTER — Ambulatory Visit: Payer: Commercial Managed Care - PPO | Admitting: Neurology

## 2023-08-05 ENCOUNTER — Other Ambulatory Visit: Payer: Self-pay | Admitting: Adult Health

## 2023-08-05 DIAGNOSIS — G4733 Obstructive sleep apnea (adult) (pediatric): Secondary | ICD-10-CM

## 2023-08-06 ENCOUNTER — Other Ambulatory Visit (HOSPITAL_COMMUNITY): Payer: Self-pay

## 2023-08-07 ENCOUNTER — Other Ambulatory Visit: Payer: Self-pay

## 2023-08-07 ENCOUNTER — Ambulatory Visit: Payer: Commercial Managed Care - PPO | Admitting: Adult Health

## 2023-08-07 ENCOUNTER — Encounter: Payer: Self-pay | Admitting: Neurology

## 2023-08-07 ENCOUNTER — Other Ambulatory Visit (HOSPITAL_BASED_OUTPATIENT_CLINIC_OR_DEPARTMENT_OTHER): Payer: Self-pay

## 2023-08-07 ENCOUNTER — Other Ambulatory Visit (HOSPITAL_COMMUNITY): Payer: Self-pay

## 2023-08-07 MED ORDER — ARMODAFINIL 200 MG PO TABS
200.0000 mg | ORAL_TABLET | Freq: Every day | ORAL | 0 refills | Status: DC | PRN
  Filled 2023-08-07: qty 90, 90d supply, fill #0

## 2023-08-07 NOTE — Telephone Encounter (Signed)
 LF 08/12 LV 11/1 NV 02/5

## 2023-08-07 NOTE — Telephone Encounter (Signed)
 Noted! Thank you

## 2023-08-14 ENCOUNTER — Ambulatory Visit: Payer: Self-pay | Admitting: Family Medicine

## 2023-08-14 ENCOUNTER — Encounter: Payer: Self-pay | Admitting: Family Medicine

## 2023-08-14 DIAGNOSIS — R531 Weakness: Secondary | ICD-10-CM | POA: Diagnosis not present

## 2023-08-14 DIAGNOSIS — Z7984 Long term (current) use of oral hypoglycemic drugs: Secondary | ICD-10-CM | POA: Diagnosis not present

## 2023-08-14 DIAGNOSIS — E11649 Type 2 diabetes mellitus with hypoglycemia without coma: Secondary | ICD-10-CM | POA: Diagnosis not present

## 2023-08-14 DIAGNOSIS — R42 Dizziness and giddiness: Secondary | ICD-10-CM | POA: Diagnosis not present

## 2023-08-14 NOTE — Telephone Encounter (Signed)
  Chief Complaint: low blood sugar Symptoms: weak, foggy-headed, dry mouth, headache Frequency: constant  Disposition: [x] ED /[] Urgent Care (no appt availability in office) / [] Appointment(In office/virtual)/ []  [facility]/ [] Home Care/ [] Refused Recommended Disposition /[] [facility]/ []  Follow-up with PCP Additional Notes: Pt called due to low blood sugar at 5am. Pt slept through all alarms. Blood sugar was 40. At 9am it was 55. At 10, pt drank can of fanta and raised to 70 and then ate Nabs crackers. Currently blood sugar is 132. Pt has slurred speech, sounds very weak, and pounding headache.  Pt took 12.5mg  Monjaro on 08/05/23. 911 called. RN stayed on phone with pt until EMS arrived.             Copied from CRM [id number]. Topic: Clinical - Red Word Triage >> Aug 14, 2023 12:03 PM Powell HERO wrote: Red Word that prompted transfer to Nurse Triage: Blood sugar low 40, now patient is feeling disoriented and body feels exhausted and she can barely keep her eyes open Reason for Disposition  Acting confused (e.g., disoriented, slurred speech)  Answer Assessment - Initial Assessment Questions 1. SYMPTOMS: What symptoms are you concerned about?     Slurred speech, BS sugar dropped to 40 at 5am 2. ONSET:  When did the symptoms start?     This morning 3. BLOOD GLUCOSE: What is your blood glucose level?      132 4. USUAL RANGE: What is your blood glucose level usually? (e.g., usual fasting morning value, usual evening value)     80s 5. TYPE 1 or 2:  Do you know what type of diabetes you have?  (e.g., Type 1, Type 2, Gestational; doesn't know)      Type 2 6. INSULIN : Do you take insulin ? What type of insulin (s) do you use? What is the mode of delivery? (syringe, pen; injection or pump) When did you last give yourself an insulin  dose? (i.e., time or hours/minutes ago) How much did you give? (i.e., how many units)     Monjaro 12.5mg  on 12/31 7. DIABETES  PILLS: Do you take any pills for your diabetes? If Yes, ask: What is the name of the medicine(s) that you take for high blood sugar?     na 8. OTHER SYMPTOMS: Do you have any symptoms? (e.g., fever, frequent urination, difficulty breathing, vomiting)     Dry mouth, very tired, foggy-headed 9. LOW BLOOD GLUCOSE TREATMENT: What have you done so far to treat the low blood glucose level?     Can drank can of Fanta at 10am 10. FOOD: When did you last eat or drink?       Nabs crackers  11. ALONE: Are you alone right now or is someone with you?        alone  Protocols used: Diabetes - Low Blood Sugar-A-AH

## 2023-08-15 ENCOUNTER — Emergency Department (HOSPITAL_BASED_OUTPATIENT_CLINIC_OR_DEPARTMENT_OTHER)
Admission: EM | Admit: 2023-08-15 | Discharge: 2023-08-15 | Disposition: A | Discharge status: Home / Self Care | Payer: Commercial Managed Care - PPO | Attending: Emergency Medicine | Admitting: Emergency Medicine

## 2023-08-15 ENCOUNTER — Encounter (HOSPITAL_BASED_OUTPATIENT_CLINIC_OR_DEPARTMENT_OTHER): Payer: Self-pay | Admitting: Emergency Medicine

## 2023-08-15 DIAGNOSIS — Z79899 Other long term (current) drug therapy: Secondary | ICD-10-CM | POA: Insufficient documentation

## 2023-08-15 DIAGNOSIS — E119 Type 2 diabetes mellitus without complications: Secondary | ICD-10-CM | POA: Insufficient documentation

## 2023-08-15 DIAGNOSIS — R5383 Other fatigue: Secondary | ICD-10-CM | POA: Diagnosis present

## 2023-08-15 DIAGNOSIS — E860 Dehydration: Secondary | ICD-10-CM | POA: Diagnosis not present

## 2023-08-15 DIAGNOSIS — E039 Hypothyroidism, unspecified: Secondary | ICD-10-CM | POA: Insufficient documentation

## 2023-08-15 DIAGNOSIS — Z7984 Long term (current) use of oral hypoglycemic drugs: Secondary | ICD-10-CM | POA: Diagnosis not present

## 2023-08-15 DIAGNOSIS — Z794 Long term (current) use of insulin: Secondary | ICD-10-CM | POA: Diagnosis not present

## 2023-08-15 DIAGNOSIS — R0789 Other chest pain: Secondary | ICD-10-CM | POA: Diagnosis not present

## 2023-08-15 LAB — COMPREHENSIVE METABOLIC PANEL
ALT: 13 U/L (ref 0–44)
AST: 16 U/L (ref 15–41)
Albumin: 4.4 g/dL (ref 3.5–5.0)
Alkaline Phosphatase: 96 U/L (ref 38–126)
Anion gap: 8 (ref 5–15)
BUN: 13 mg/dL (ref 6–20)
CO2: 28 mmol/L (ref 22–32)
Calcium: 9.5 mg/dL (ref 8.9–10.3)
Chloride: 101 mmol/L (ref 98–111)
Creatinine, Ser: 0.98 mg/dL (ref 0.44–1.00)
GFR, Estimated: >60 mL/min (ref >60)
Glucose, Bld: 91 mg/dL (ref 70–99)
Potassium: 3.8 mmol/L (ref 3.5–5.1)
Sodium: 137 mmol/L (ref 135–145)
Total Bilirubin: 0.4 mg/dL (ref 0.0–1.2)
Total Protein: 7.4 g/dL (ref 6.5–8.1)

## 2023-08-15 LAB — CBC WITH DIFFERENTIAL/PLATELET
Abs Immature Granulocytes: 0.01 10*3/uL (ref 0.00–0.07)
Basophils Absolute: 0 10*3/uL (ref 0.0–0.1)
Basophils Relative: 0 %
Eosinophils Absolute: 0.1 10*3/uL (ref 0.0–0.5)
Eosinophils Relative: 1 %
HCT: 40.7 % (ref 36.0–46.0)
Hemoglobin: 13.8 g/dL (ref 12.0–15.0)
Immature Granulocytes: 0 %
Lymphocytes Relative: 30 %
Lymphs Abs: 1.5 10*3/uL (ref 0.7–4.0)
MCH: 30.7 pg (ref 26.0–34.0)
MCHC: 33.9 g/dL (ref 30.0–36.0)
MCV: 90.6 fL (ref 80.0–100.0)
Monocytes Absolute: 0.3 10*3/uL (ref 0.1–1.0)
Monocytes Relative: 6 %
Neutro Abs: 3.1 10*3/uL (ref 1.7–7.7)
Neutrophils Relative %: 63 %
Platelets: 155 10*3/uL (ref 150–400)
RBC: 4.49 MIL/uL (ref 3.87–5.11)
RDW: 14.6 % (ref 11.5–15.5)
WBC: 4.9 10*3/uL (ref 4.0–10.5)
nRBC: 0 % (ref 0.0–0.2)

## 2023-08-15 LAB — URINALYSIS, ROUTINE W REFLEX MICROSCOPIC
Bilirubin Urine: NEGATIVE
Glucose, UA: NEGATIVE mg/dL
Hgb urine dipstick: NEGATIVE
Ketones, ur: NEGATIVE mg/dL
Leukocytes,Ua: NEGATIVE
Nitrite: NEGATIVE
Protein, ur: NEGATIVE mg/dL
Specific Gravity, Urine: 1.007 (ref 1.005–1.030)
pH: 6 (ref 5.0–8.0)

## 2023-08-15 LAB — TROPONIN I (HIGH SENSITIVITY): Troponin I (High Sensitivity): <2 ng/L (ref <18)

## 2023-08-15 LAB — AMMONIA: Ammonia: 29 umol/L (ref 9–35)

## 2023-08-15 LAB — CBG MONITORING, ED: Glucose-Capillary: 82 mg/dL (ref 70–99)

## 2023-08-15 MED ORDER — SODIUM CHLORIDE 0.9 % IV BOLUS
1000.0000 mL | Freq: Once | INTRAVENOUS | Status: AC
  Administered 2023-08-15: 1000 mL via INTRAVENOUS

## 2023-08-15 NOTE — ED Provider Notes (Signed)
 [place] EMERGENCY DEPARTMENT AT [facility] Provider Note   CSN: [id number] Arrival date & time: 08/15/23  0930     History  Chief Complaint  Patient presents with   Chest Pain    ROSILYN [name] is a 58 y.o. female.  Pt is a 58 yo female with pmhx significant for pcos, migraines, hypothyroidism, arthritis, dm, nash, bipolar d/o, borderline personality d/o, gerd, and arthritis.  Pt said she was hypoglycemic yesterday and went to [facility] in [place].  Pt said she had several episodes while in the hospital, but was d/c.  While there, she had an EKG which was abnormal.  She said they did not address it, so she wanted to make sure her heart was ok.  She denies any cp.  She feels very tired still from her ordeal yesterday.  She was able to drive here.       Home Medications Prior to Admission medications   Medication Sig Start Date End Date Taking? Authorizing Provider  AMBULATORY NON FORMULARY MEDICATION Rollator for use daily. Elevated toilet seat with handlebars 05/12/23   Curtis Debby PARAS, MD  amoxicillin -clavulanate (AUGMENTIN ) 875-125 MG tablet Take 1 tablet by mouth 2 (two) times daily. 07/11/23   Blair, Diane W, FNP  Armodafinil  200 MG TABS Take 1 tablet (200 mg total) by mouth daily as needed (sleepiness). 08/07/23   Mozingo, Regina Nattalie, NP  Atogepant  (QULIPTA ) 60 MG TABS Take 1 tablet (60 mg total) by mouth daily. 03/20/23   Onita Duos, MD  Blood Glucose Monitoring Suppl (FREESTYLE LITE) w/Device KIT Use as directed. 01/01/22   Curtis Debby PARAS, MD  Blood Glucose Monitoring Suppl (FREESTYLE LITE) w/Device KIT Check fasting blood sugar every morning and before meals up to 4 times daily. 01/01/22   Alvia Bring, DO  botulinum toxin Type A  (BOTOX ) 200 units injection Inject 200 Units into the muscle every 3 (three) months. 03/31/23   Onita Duos, MD  clobetasol  (TEMOVATE ) 0.05 % GEL Apply small amount to affected area twice daily using finger. Gently  pat dry with cotton gauze.  Do not eat,drink, or speak for 30 minutes to allow absorption. 03/21/22     clonazePAM  (KLONOPIN ) 1 MG tablet Take 1 tablet (1 mg total) by mouth 3 (three) times daily. 06/06/23   Mozingo, Regina Nattalie, NP  Continuous Glucose Sensor (DEXCOM G6 SENSOR) MISC Replace every 10 days.  Use to check blood sugar. 03/19/23   Alvia Bring, DO  Continuous Glucose Transmitter (DEXCOM G6 TRANSMITTER) MISC Use to check continuous blood sugar.  Replace every 90 days 03/19/23   Alvia Bring, DO  cyclobenzaprine  (FLEXERIL ) 10 MG tablet Take 1 tablet (10 mg total) by mouth 3 (three) times daily as needed for muscle spasms. 10/17/22   Shuford, Randine, PA-C  diclofenac  Sodium (VOLTAREN ) 1 % GEL Apply 1 Application topically 4 (four) times daily as needed (pain).    [provider]  doxycycline  (VIBRA -TABS) 100 MG tablet Take 1 tablet (100 mg total) by mouth 2 (two) times daily. 03/28/23   Alvia Bring, DO  ferrous sulfate  325 (65 FE) MG EC tablet Take 1 tablet (325 mg total) by mouth in the morning and at bedtime. Take iron supplement 2 hours before or 4 hours after PPI or other antacids as this requires some level of gastric acidity to aid in absorption Patient taking differently: Take 325 mg by mouth daily. Take iron supplement 2 hours before or 4 hours after PPI or other antacids as this requires some  level of gastric acidity to aid in absorption 09/05/22   Curtis Debby PARAS, MD  fluconazole  (DIFLUCAN ) 100 MG tablet Take 1 tablet (100 mg total) by mouth daily. 06/12/23     fluocinonide  gel (LIDEX ) 0.05 % Apply topically 2 (two) times daily Gently pat the affected area(s) dry with cotton gauze. Place a small amount of gel on a finger and apply to ulcerations. No eating , drinking or speaking for 20 - 30 minutes to allow steroid to be absorbed. Gel can also be applied to 2x2 gauze placed against the affected area. 05/01/23     folic acid  (FOLVITE ) 1 MG tablet Take 1 tablet (1 mg  total) by mouth daily. 03/11/23   Alvia Bring, DO  furosemide  (LASIX ) 40 MG tablet Take 1 tablet (40 mg total) by mouth daily. 06/18/23   Alvia Bring, DO  glucose blood (FREESTYLE LITE) test strip Use as instructed 02/11/21   Couture, Cortni S, PA-C  glucose blood (FREESTYLE LITE) test strip Check fasting blood sugar every morning and before meals up to 4 times a day as directed. 01/01/22   Alvia Bring, DO  hydrOXYzine  (ATARAX ) 25 MG tablet Take 1 tablet (25 mg total) by mouth 3 (three) times daily as needed for anxiety. 06/06/23   Mozingo, Regina Nattalie, NP  insulin  lispro (HUMALOG  KWIKPEN) 100 UNIT/ML KwikPen Inject up to 16 units 3 times daily at mealtime per sliding scale. Patient taking differently: Inject 3-16 Units into the skin 3 (three) times daily as needed (when taking steroids). 05/07/22   Alvia Bring, DO  Insulin  Pen Needle (UNIFINE PENTIPS) 31G X 5 MM MISC Use as directed to inject insulin  01/28/22   Alvia Bring, DO  ketorolac  (TORADOL ) 10 MG tablet Take 1 tablet (10 mg total) by mouth every 8 (eight) hours as needed. 01/10/23   Curtis Debby PARAS, MD  lamoTRIgine  (LAMICTAL ) 200 MG tablet Take 2 tablets by mouth at bedtime. 08/12/22   Mozingo, Regina Nattalie, NP  Lancets 30G MISC Check fasting blood sugar every morning and before meals up to 4 times daily. 01/01/22   Alvia Bring, DO  lidocaine  (LIDODERM ) 5 % Place 1 patch onto the skin every 12 (twelve) hours. Remove & Discard patch within 12 hours or as directed by MD 11/28/22   Bevin Bernice RAMAN, DO  lidocaine  (XYLOCAINE ) 5 % ointment Apply topically to affected areas 3 times daily as needed. 02/18/22   Alvia Bring, DO  lisinopril  (ZESTRIL ) 10 MG tablet Take 1 tablet (10 mg total) by mouth daily. 07/08/23   Alvia Bring, DO  meloxicam  (MOBIC ) 15 MG tablet Take 1 tablet by mouth once every morning with a meal for 2 weeks, then daily as needed for pain. 10/17/22   Shuford, Randine, PA-C  Menthol , Topical Analgesic, (BENGAY EX)  Apply 1 Application topically daily as needed (pain).    [provider]  neomycin-bacitracin -polymyxin [phone] OINT Apply topically 2 (two) times daily for 10 days To corners of mouth Patient taking differently: Apply 1 Application topically 2 (two) times daily as needed (cracking/ raw skin around mouth). 01/17/22     nystatin  (MYCOSTATIN ) 100000 UNIT/ML suspension Use 5ml to swish in mouth and swallow --use 4 times daily for 10 days 09/26/22     nystatin  (NYAMYC ) powder Apply 3 times daily as directed. Patient taking differently: Apply 1 application  topically 3 (three) times daily as needed (rash). 11/27/21   Alvia Bring, DO  nystatin  ointment (MYCOSTATIN ) Apply to the skin 2 times daily to corners of  mouth Patient taking differently: Apply 1 Application topically 2 (two) times daily as needed (raw/cracking skin around the mouth). 01/17/22     ondansetron  (ZOFRAN ) 4 MG tablet Take 1 tablet (4 mg total) by mouth every 8 (eight) hours as needed for nausea or vomiting. 10/17/22   Shuford, Randine, PA-C  ondansetron  (ZOFRAN -ODT) 4 MG disintegrating tablet Dissolve 1 tablet (4 mg total) by mouth 2 (two) times daily as needed for nausea or vomiting. 08/30/21   Alvia Bring, DO  pantoprazole  (PROTONIX ) 40 MG tablet Take 2 tablets (80 mg total) by mouth 2 (two) times daily. 07/24/23   Alvia Bring, DO  pimecrolimus  (ELIDEL ) 1 % cream Apply a small amount to affected area 2 x daily. Gently pat area dry w/cotton gauze. No eating, drinking or speaking for 20 - 30 minutes to allow medicine to be absorbed. Can apply to 2x2 gauze & placed against the affected area. Patient taking differently: Apply 1 Application topically 2 (two) times daily as needed (ulcers). 01/17/22     pregabalin  (LYRICA ) 75 MG capsule Take 1 capsule (75 mg total) by mouth 3 (three) times daily. 03/14/23   Curtis Debby PARAS, MD  Rimegepant Sulfate  (NURTEC) 75 MG TBDP Dissolve 1 tablet (75 mg total) by mouth once as needed for  up to 1 dose (migraines). 04/16/23   Alvia Bring, DO  risperiDONE  (RISPERDAL ) 1 MG tablet Take 1.5 tablets (1.5 mg total) by mouth at bedtime. 06/06/23   Mozingo, Regina Nattalie, NP  rosuvastatin  (CRESTOR ) 20 MG tablet TAKE 1 TABLET BY MOUTH DAILY. 04/26/22 05/15/23  Alvia Bring, DO  sertraline  (ZOLOFT ) 100 MG tablet Take 2 tablets (200 mg total) by mouth every morning. 06/06/23 06/05/24  Mozingo, Regina Nattalie, NP  spironolactone  (ALDACTONE ) 50 MG tablet Take 1 tablet (50 mg total) by mouth 2 (two) times daily. 05/16/23   Alvia Bring, DO  SYNTHROID  200 MCG tablet Take 1 tablet (200 mcg total) by mouth daily before breakfast. 03/20/23   Alvia Bring, DO  tirzepatide  (MOUNJARO ) 12.5 MG/0.5ML Pen Inject 12.5 mg into the skin once a week. 07/24/23   Alvia Bring, DO  traMADol  (ULTRAM ) 50 MG tablet Take 2 tablets (100 mg total) by mouth every 8 (eight) hours as needed for moderate pain. 05/08/23   Curtis Debby PARAS, MD  traZODone  (DESYREL ) 50 MG tablet Take 1 tablet (50 mg total) by mouth at bedtime. 06/06/23   Mozingo, Regina Nattalie, NP      Allergies    Sulfa antibiotics, Food, Bupropion hcl, Codeine, and Sulfamethoxazole-trimethoprim    Review of Systems   Review of Systems  Constitutional:  Positive for fatigue.    Physical Exam Updated Vital Signs BP 108/68   Pulse 72   Temp 98.1 F (36.7 C)   Resp 17   Wt 128.8 kg   LMP 08/24/2020   SpO2 96%   BMI 39.61 kg/m  Physical Exam Vitals and nursing note reviewed.  Constitutional:      Appearance: She is well-developed. She is obese.  HENT:     Head: Normocephalic and atraumatic.  Eyes:     Extraocular Movements: Extraocular movements intact.     Pupils: Pupils are equal, round, and reactive to light.  Cardiovascular:     Rate and Rhythm: Normal rate and regular rhythm.     Heart sounds: Normal heart sounds.  Pulmonary:     Effort: Pulmonary effort is normal.     Breath sounds: Normal breath sounds.  Abdominal:      General: Bowel  sounds are normal.     Palpations: Abdomen is soft.  Musculoskeletal:        General: Normal range of motion.     Cervical back: Normal range of motion and neck supple.  Skin:    General: Skin is warm.     Capillary Refill: Capillary refill takes less than 2 seconds.  Neurological:     General: No focal deficit present.     Mental Status: She is alert and oriented to person, place, and time.  Psychiatric:        Mood and Affect: Mood normal.        Behavior: Behavior normal.     ED Results / Procedures / Treatments   Labs (all labs ordered are listed, but only abnormal results are displayed) Labs Reviewed  URINALYSIS, ROUTINE W REFLEX MICROSCOPIC - Abnormal; Notable for the following components:      Result Value   Color, Urine COLORLESS (*)    All other components within normal limits  CBC WITH DIFFERENTIAL/PLATELET  COMPREHENSIVE METABOLIC PANEL  AMMONIA  CBG MONITORING, ED  CBG MONITORING, ED  TROPONIN I (HIGH SENSITIVITY)  TROPONIN I (HIGH SENSITIVITY)    EKG EKG Interpretation Date/Time:  Friday August 15 2023 09:40:58 EST Ventricular Rate:  74 PR Interval:  179 QRS Duration:  94 QT Interval:  384 QTC Calculation: 426 R Axis:   -6  Text Interpretation: Sinus rhythm Low voltage, extremity leads Minimal ST depression, inferior leads No significant change since last tracing Confirmed by Dean Clarity [phone]) on 08/15/2023 9:58:47 AM  Radiology No results found.  Procedures Procedures    Medications Ordered in ED Medications  sodium chloride  0.9 % bolus 1,000 mL (0 mLs Intravenous Stopped 08/15/23 1124)    ED Course/ Medical Decision Making/ A&P                                 Medical Decision Making Amount and/or Complexity of Data Reviewed Labs: ordered.   This patient presents to the ED for concern of abn ekg, this involves an extensive number of treatment options, and is a complaint that carries with it a high risk of  complications and morbidity.  The differential diagnosis includes stemi, nstemi, electrolyte abn, poor tracing   Co morbidities that complicate the patient evaluation  pcos, migraines, hypothyroidism, arthritis, dm, nash, bipolar d/o, borderline personality d/o, gerd, and arthritis   Additional history obtained:  Additional history obtained from epic chart review  Lab Tests:  I Ordered, and personally interpreted labs.  The pertinent results include:  cbc nl, cmp nl, trop nl, ua nl  Cardiac Monitoring:  The patient was maintained on a cardiac monitor.  I personally viewed and interpreted the cardiac monitored which showed an underlying rhythm of: nsr   Medicines ordered and prescription drug management:  I have reviewed the patients home medicines and have made adjustments as needed  Problem List / ED Course:  Weakness:  likely due to her hypoglycemic episodes yesterday.  She is feeling better after fluids.  EKG and labs nl.  Pt is encouraged to eat a high protein diet to keep her bs stable.  She is to return if worse.  F/u with pcp.   Reevaluation:  After the interventions noted above, I reevaluated the patient and found that they have :improved   Social Determinants of Health:  Lives at home   Dispostion:  After consideration of the diagnostic  results and the patients response to treatment, I feel that the patent would benefit from discharge with outpatient f/u.          Final Clinical Impression(s) / ED Diagnoses Final diagnoses:  Dehydration    Rx / DC Orders ED Discharge Orders     None         Dean Clarity, MD 08/15/23 1154

## 2023-08-15 NOTE — Telephone Encounter (Signed)
 Patient advised to go to the ED and she agreed.

## 2023-08-15 NOTE — ED Triage Notes (Signed)
 Pt was tx at [facility] for hypoglycemia yesterday. Reports EKG was sent to PCP and was referred for "infarction". Pt c/o LT side CP since yesterday

## 2023-08-19 ENCOUNTER — Encounter: Payer: Self-pay | Admitting: Family Medicine

## 2023-08-31 ENCOUNTER — Other Ambulatory Visit: Payer: Self-pay | Admitting: Sports Medicine

## 2023-08-31 ENCOUNTER — Other Ambulatory Visit (HOSPITAL_COMMUNITY): Payer: Self-pay

## 2023-08-31 ENCOUNTER — Other Ambulatory Visit: Payer: Self-pay | Admitting: Family Medicine

## 2023-08-31 DIAGNOSIS — M2550 Pain in unspecified joint: Secondary | ICD-10-CM

## 2023-09-01 ENCOUNTER — Other Ambulatory Visit: Payer: Self-pay

## 2023-09-01 MED ORDER — PREGABALIN 75 MG PO CAPS
75.0000 mg | ORAL_CAPSULE | Freq: Three times a day (TID) | ORAL | 3 refills | Status: DC
  Filled 2023-09-01: qty 90, 30d supply, fill #0
  Filled 2023-10-16: qty 90, 30d supply, fill #1
  Filled 2023-11-17: qty 90, 30d supply, fill #2
  Filled 2024-01-14: qty 90, 30d supply, fill #3

## 2023-09-02 ENCOUNTER — Other Ambulatory Visit: Payer: Self-pay

## 2023-09-02 MED ORDER — NYSTATIN 100000 UNIT/GM EX POWD
1.0000 | Freq: Three times a day (TID) | CUTANEOUS | 3 refills | Status: AC
  Filled 2023-09-02: qty 60, 20d supply, fill #0
  Filled 2023-09-24: qty 60, 20d supply, fill #1

## 2023-09-03 ENCOUNTER — Other Ambulatory Visit: Payer: Self-pay

## 2023-09-04 ENCOUNTER — Ambulatory Visit: Payer: Commercial Managed Care - PPO | Admitting: Family Medicine

## 2023-09-04 ENCOUNTER — Other Ambulatory Visit: Payer: Self-pay

## 2023-09-04 VITALS — BP 102/65 | HR 74 | Ht 71.0 in | Wt 287.5 lb

## 2023-09-04 DIAGNOSIS — M6281 Muscle weakness (generalized): Secondary | ICD-10-CM | POA: Insufficient documentation

## 2023-09-04 DIAGNOSIS — I10 Essential (primary) hypertension: Secondary | ICD-10-CM

## 2023-09-04 DIAGNOSIS — E559 Vitamin D deficiency, unspecified: Secondary | ICD-10-CM

## 2023-09-04 DIAGNOSIS — Z7985 Long-term (current) use of injectable non-insulin antidiabetic drugs: Secondary | ICD-10-CM | POA: Diagnosis not present

## 2023-09-04 DIAGNOSIS — Z7984 Long term (current) use of oral hypoglycemic drugs: Secondary | ICD-10-CM | POA: Diagnosis not present

## 2023-09-04 DIAGNOSIS — N83521 Torsion of right fallopian tube: Secondary | ICD-10-CM | POA: Insufficient documentation

## 2023-09-04 DIAGNOSIS — M797 Fibromyalgia: Secondary | ICD-10-CM

## 2023-09-04 DIAGNOSIS — M2550 Pain in unspecified joint: Secondary | ICD-10-CM | POA: Diagnosis not present

## 2023-09-04 DIAGNOSIS — E1169 Type 2 diabetes mellitus with other specified complication: Secondary | ICD-10-CM | POA: Diagnosis not present

## 2023-09-04 DIAGNOSIS — M25561 Pain in right knee: Secondary | ICD-10-CM | POA: Insufficient documentation

## 2023-09-04 MED ORDER — LISINOPRIL 5 MG PO TABS
5.0000 mg | ORAL_TABLET | Freq: Every day | ORAL | 2 refills | Status: DC
  Filled 2023-09-04: qty 90, 90d supply, fill #0
  Filled 2024-03-05 – 2024-04-06 (×2): qty 90, 90d supply, fill #1
  Filled 2024-07-01: qty 30, 30d supply, fill #2
  Filled 2024-08-04: qty 30, 30d supply, fill #3
  Filled 2024-08-18: qty 60, 60d supply, fill #3

## 2023-09-05 ENCOUNTER — Other Ambulatory Visit: Payer: Self-pay

## 2023-09-05 DIAGNOSIS — M25562 Pain in left knee: Secondary | ICD-10-CM | POA: Diagnosis not present

## 2023-09-05 DIAGNOSIS — M6281 Muscle weakness (generalized): Secondary | ICD-10-CM | POA: Diagnosis not present

## 2023-09-05 DIAGNOSIS — M25561 Pain in right knee: Secondary | ICD-10-CM | POA: Diagnosis not present

## 2023-09-05 LAB — CBC WITH DIFFERENTIAL/PLATELET
Basophils Absolute: 0 10*3/uL (ref 0.0–0.2)
Basos: 0 %
EOS (ABSOLUTE): 0.1 10*3/uL (ref 0.0–0.4)
Eos: 2 %
Hematocrit: 39.8 % (ref 34.0–46.6)
Hemoglobin: 13.1 g/dL (ref 11.1–15.9)
Immature Grans (Abs): 0 10*3/uL (ref 0.0–0.1)
Immature Granulocytes: 0 %
Lymphocytes Absolute: 1.7 10*3/uL (ref 0.7–3.1)
Lymphs: 36 %
MCH: 30.2 pg (ref 26.6–33.0)
MCHC: 32.9 g/dL (ref 31.5–35.7)
MCV: 92 fL (ref 79–97)
Monocytes Absolute: 0.3 10*3/uL (ref 0.1–0.9)
Monocytes: 6 %
Neutrophils Absolute: 2.6 10*3/uL (ref 1.4–7.0)
Neutrophils: 56 %
Platelets: 198 10*3/uL (ref 150–450)
RBC: 4.34 x10E6/uL (ref 3.77–5.28)
RDW: 14 % (ref 11.7–15.4)
WBC: 4.7 10*3/uL (ref 3.4–10.8)

## 2023-09-05 LAB — CMP14+EGFR
ALT: 14 [IU]/L (ref 0–32)
AST: 20 [IU]/L (ref 0–40)
Albumin: 4.3 g/dL (ref 3.8–4.9)
Alkaline Phosphatase: 130 [IU]/L — ABNORMAL HIGH (ref 44–121)
BUN/Creatinine Ratio: 10 (ref 9–23)
BUN: 11 mg/dL (ref 6–24)
Bilirubin Total: 0.3 mg/dL (ref 0.0–1.2)
CO2: 26 mmol/L (ref 20–29)
Calcium: 9.5 mg/dL (ref 8.7–10.2)
Chloride: 101 mmol/L (ref 96–106)
Creatinine, Ser: 1.05 mg/dL — ABNORMAL HIGH (ref 0.57–1.00)
Globulin, Total: 2.5 g/dL (ref 1.5–4.5)
Glucose: 80 mg/dL (ref 70–99)
Potassium: 4.2 mmol/L (ref 3.5–5.2)
Sodium: 141 mmol/L (ref 134–144)
Total Protein: 6.8 g/dL (ref 6.0–8.5)
eGFR: 63 mL/min/{1.73_m2} (ref >59)

## 2023-09-05 LAB — HEMOGLOBIN A1C
Est. average glucose Bld gHb Est-mCnc: 100 mg/dL
Hgb A1c MFr Bld: 5.1 % (ref 4.8–5.6)

## 2023-09-05 LAB — VITAMIN D 25 HYDROXY (VIT D DEFICIENCY, FRACTURES): Vit D, 25-Hydroxy: 24.9 ng/mL — ABNORMAL LOW (ref 30.0–100.0)

## 2023-09-05 LAB — CK: Total CK: 136 U/L (ref 32–182)

## 2023-09-05 LAB — SEDIMENTATION RATE: Sed Rate: 7 mm/h (ref 0–40)

## 2023-09-05 LAB — C-REACTIVE PROTEIN: CRP: 4 mg/L (ref 0–10)

## 2023-09-07 ENCOUNTER — Other Ambulatory Visit (INDEPENDENT_AMBULATORY_CARE_PROVIDER_SITE_OTHER): Payer: Self-pay | Admitting: Family Medicine

## 2023-09-07 ENCOUNTER — Encounter: Payer: Self-pay | Admitting: Family Medicine

## 2023-09-07 ENCOUNTER — Other Ambulatory Visit: Payer: Self-pay | Admitting: Adult Health

## 2023-09-07 DIAGNOSIS — M797 Fibromyalgia: Secondary | ICD-10-CM | POA: Insufficient documentation

## 2023-09-07 DIAGNOSIS — F331 Major depressive disorder, recurrent, moderate: Secondary | ICD-10-CM

## 2023-09-07 DIAGNOSIS — E538 Deficiency of other specified B group vitamins: Secondary | ICD-10-CM

## 2023-09-07 NOTE — Assessment & Plan Note (Signed)
Updated A1c ordered today.  She admits to intentionally withholding food to try and improve her weight in preparation for knee replacement.  She is eating a regular diet at this point.  Tolerated Mounjaro at current strength of 12.5 mg/week now.

## 2023-09-07 NOTE — Assessment & Plan Note (Signed)
Blood pressure is on the low end.  Reducing lisinopril to 5 mg

## 2023-09-07 NOTE — Progress Notes (Signed)
Tracy [name] - 58 y.o. female MRN [id number]  Date of birth: 05-01-1966  Subjective Chief Complaint  Patient presents with   Weight Loss    HPI Tracy [name] is a 58 y.o. female here today for follow-up visit.    She is having some issues with her glucose levels dropping previously.  She admits that she was intentionally not eating due to trying to lose weight to have her knee replacement.  She is doing better with her diet now and blood sugars have remained stable.  She does report some increased joint and muscle pain all over.  She has had this for a few weeks.  She does have history of fibromyalgia.  She is currently on pregabalin.    Her blood pressure has been a little recently.  Occasional lightheaded feeling.  ROS:  A comprehensive ROS was completed and negative except as noted per HPI  Allergies  Allergen Reactions   Sulfa Antibiotics Itching, Swelling and Rash   Food     Walnut-mouth swelling/itching   Bupropion Hcl Itching and Rash   Codeine Itching    Past Medical History:  Diagnosis Date   Acanthosis nigricans, acquired 02/09/2015   Anxiety and depression 10/31/2014   Arthritis    back, knees, right elbow   Arthritis of left hip 06/25/2018   Bilateral swelling of feet and ankles    Bipolar disorder (HCC)    Borderline personality disorder (HCC)    Cancer (HCC)    oral lichen planus, uses mouth wash, cream   Carpal tunnel syndrome 09/06/2015   Chewing difficulty    Chronic left shoulder pain 05/17/2020   Constipation    Coronary artery disease 03/21/2021   COVID 10/08/2021   Cystic teratoma    BENIGN   Dental crowns present    Diabetes mellitus without complication (HCC)    Diabetes mellitus, type 2 (HCC) 06/10/2013   Diarrhea    Diverticulosis of colon without hemorrhage    DOE (dyspnea on exertion) 03/21/2021   Dyspepsia 12/14/2020   Essential hypertension 10/02/2007   Qualifier: Diagnosis of  By: Everardo All MD, Sean A    Facial rash 06/16/2019    Female hirsutism 02/09/2015   Food allergy    Walnuts   GAD (generalized anxiety disorder)    Gastritis and gastroduodenitis    Gastroesophageal reflux disease    Grade II internal hemorrhoids    Greater trochanteric bursitis of left hip 07/24/2018   Hemorrhoids, internal 07/18/2017   History of kidney stones    History of migraine headaches    History of posttraumatic stress disorder (PTSD)    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/2015   Hypothyroidism    HYPOTHYROIDISM, POSTSURGICAL 10/02/2007   Qualifier: Diagnosis of  By: Everardo All MD, Sean A    IBS (irritable bowel syndrome)    Infertility associated with anovulation 02/09/2015   Insulin resistance 02/09/2015   Joint pain    Leg swelling 09/21/2020   Lichen planus    Liver cirrhosis secondary to NASH (nonalcoholic steatohepatitis) (HCC)    Lower back pain 07/18/2017   MDD (major depressive disorder) 06/06/2015   Migraine 10/02/2007   Morbid obesity with BMI of 40.0-44.9, adult (HCC) 06/23/2020   NASH (nonalcoholic steatohepatitis)    Nausea without vomiting    Nonalcoholic fatty liver disease 02/09/2015   Obstructive sleep apnea 06/27/2014   Oral mucosal lesion 06/17/2019   Osteoarthritis    Other fatigue    PCOS (polycystic ovarian syndrome)    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  POLYCYSTIC OVARIAN DISEASE 10/02/2007   Qualifier: Diagnosis of  By: Everardo All MD, Gregary Signs A    PONV (postoperative nausea and vomiting)    also hx. 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   Qualifier: Diagnosis of  By: Everardo All MD,  Sean A    Trigeminal neuralgia    Unilateral primary osteoarthritis, left hip 02/10/2019   Vitamin D deficiency     Past Surgical History:  Procedure Laterality Date   ACHILLES TENDON SURGERY Left 2007   APPENDECTOMY     CARPAL TUNNEL RELEASE Right 01/12/2016   Procedure: RIGHT CARPAL TUNNEL RELEASE;  Surgeon: Dominica Severin, MD;  Location: [facility];  Service: Orthopedics;  Laterality: Right;   COLONOSCOPY WITH PROPOFOL N/A 04/20/2014   Procedure: COLONOSCOPY WITH PROPOFOL;  Surgeon: Willis Modena, MD;  Location: [facility];  Service: Endoscopy;  Laterality: N/A;   COLONOSCOPY WITH PROPOFOL N/A 01/10/2021   Procedure: COLONOSCOPY WITH PROPOFOL;  Surgeon: Shellia Cleverly, DO;  Location: [facility];  Service: Gastroenterology;  Laterality: N/A;   ESOPHAGOGASTRODUODENOSCOPY (EGD) WITH PROPOFOL N/A 04/20/2014   Procedure: ESOPHAGOGASTRODUODENOSCOPY (EGD) WITH PROPOFOL;  Surgeon: Willis Modena, MD;  Location: [facility];  Service: Endoscopy;  Laterality: N/A;   ESOPHAGOGASTRODUODENOSCOPY (EGD) WITH PROPOFOL N/A 01/10/2021   Procedure: ESOPHAGOGASTRODUODENOSCOPY (EGD) WITH PROPOFOL;  Surgeon: Shellia Cleverly, DO;  Location: [facility];  Service: Gastroenterology;  Laterality: N/A;   EXCISION HAGLUND'S DEFORMITY WITH ACHILLES TENDON REPAIR Right 02/25/2013   Procedure: RIGHT ACHILLES DEBRIDEMENT AND RECONSTRUCTION;  HAGLUND'S EXCISION; GASTROC RECESSION AND FLEXOR HALLUCIS LONGUS TRANSFER;  Surgeon: Toni Arthurs, MD;  Location: [facility];  Service: Orthopedics;  Laterality: Right;   IR ANGIOGRAM SELECTIVE EACH ADDITIONAL VESSEL  05/15/2023   IR ANGIOGRAM SELECTIVE EACH ADDITIONAL VESSEL  05/15/2023   IR ANGIOGRAM SELECTIVE EACH ADDITIONAL VESSEL  05/15/2023   IR ANGIOGRAM SELECTIVE EACH ADDITIONAL VESSEL  05/15/2023   IR EMBO ARTERIAL NOT HEMORR HEMANG INC GUIDE ROADMAPPING  05/15/2023   IR RADIOLOGIST EVAL & MGMT  04/16/2023   IR US GUIDE VASC ACCESS LEFT   05/15/2023   JOINT REPLACEMENT     hip   LUMBAR LAMINECTOMY     X 3   neck fusion     OVARIAN CYST SURGERY Left 1998   PILONIDAL CYST EXCISION  1994   RIGHT OOPHORECTOMY     SHOULDER ARTHROSCOPY WITH ROTATOR CUFF REPAIR Right 10/17/2022   Procedure: Right shoulder arthroscopy, debridement, subacromial decompression, distal clavicle resection, rotator cuff repair;  Surgeon: Francena Hanly, MD;  Location: [facility];  Service: Orthopedics;  Laterality: Right;    STERIOD INJECTION Left 01/12/2016   Procedure: STEROID INJECTION LEFT WRIST;  Surgeon: Dominica Severin, MD;  Location: [facility];  Service: Orthopedics;  Laterality: Left;   TOTAL HIP ARTHROPLASTY Left 03/19/2019   Procedure: LEFT TOTAL HIP ARTHROPLASTY ANTERIOR APPROACH;  Surgeon: Kathryne Hitch, MD;  Location: [facility];  Service: Orthopedics;  Laterality: Left;   TOTAL THYROIDECTOMY  2003   UPPER GI ENDOSCOPY  01/19/2015  Social History   Socioeconomic History   Marital status: Divorced    Spouse name: Not on file   Number of children: 0   Years of education: 16   Highest education level: Bachelor's degree (e.g., BA, AB, BS)  Occupational History   Occupation: RN-Telephone Triage Nurse    Employer: [employer]    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   Drug use: No   Sexual activity: Not Currently    Partners: Male    Birth control/protection: Condom  Other Topics Concern   Not on file  Social History Narrative    RN - [employer]   Regular exercise: no   Caffeine use: 2 x daily   Right-handed.   Lives alone.   Social Drivers of Health   Financial Resource Strain: Medium Risk (09/03/2023)   Overall Financial Resource Strain (CARDIA)    Difficulty of Paying Living Expenses: Somewhat hard  Food Insecurity: No Food Insecurity (09/03/2023)   Hunger Vital Sign    Worried About Running Out of Food in the Last Year: Never true    Ran Out of Food in the Last Year: Never true  Transportation Needs: No Transportation Needs (09/03/2023)   PRAPARE - Administrator, Civil Service (Medical): No    Lack of Transportation (Non-Medical): No  Physical Activity: Unknown (09/03/2023)   Exercise Vital Sign    Days of Exercise per Week: 0 days    Minutes of Exercise per Session: Not on file  Stress: Stress Concern Present (09/03/2023)   [employer] of Occupational Health - Occupational Stress Questionnaire    Feeling of Stress : Rather much  Social Connections: Moderately Isolated (09/03/2023)   Social Connection and Isolation Panel [NHANES]    Frequency of Communication with Friends and Family: More than three times a week    Frequency of Social Gatherings with Friends and Family: Once a week    Attends Religious Services: More than 4 times per year    Active Member of [employer] or Organizations: No    Attends Engineer, structural: Not on file    Marital Status: Divorced    Family History  Problem Relation Age of Onset   Heart disease Father    Depression Father    Heart attack Father    Sudden death Father    Anxiety disorder Father    Bipolar disorder Father    Obesity Father    Cancer Brother        THROAT cancer   Depression Brother    Anxiety disorder Brother    Depression Sister    Breast cancer Mother    Hypertension Mother    Cancer Mother    Depression Mother    Sleep apnea Mother    Obesity Mother    Depression Maternal Grandmother    Stroke Maternal Grandfather     Colon cancer Brother  Pancreatic cancer Neg Hx    Stomach cancer Neg Hx    Liver disease Neg Hx     Health Maintenance  Topic Date Due   HIV Screening  Never done   Hepatitis C Screening  Never done   COVID-19 Vaccine (7 - 2024-25 season) 09/20/2023 (Originally 04/06/2023)   OPHTHALMOLOGY EXAM  12/04/2023 (Originally 09/19/2022)   HEMOGLOBIN A1C  03/03/2024   Diabetic kidney evaluation - Urine ACR  03/27/2024   Diabetic kidney evaluation - eGFR measurement  09/03/2024   FOOT EXAM  09/03/2024   MAMMOGRAM  04/08/2025   DTaP/Tdap/Td (2 - Td or Tdap) 01/03/2028   Cervical Cancer Screening (HPV/Pap Cotest)  03/05/2028   Colonoscopy  01/11/2031   Pneumococcal Vaccine 95-66 Years old  Completed   INFLUENZA VACCINE  Completed   Zoster Vaccines- Shingrix  Completed   HPV VACCINES  Aged Out     ----------------------------------------------------------------------------------------------------------------------------------------------------------------------------------------------------------------- Physical Exam BP 102/65 (BP Location: Right Arm, Patient Position: Sitting, Cuff Size: Normal)   Pulse 74   Ht 5\' 11"  (1.803 m)   Wt 287 lb 8 oz (130.4 kg)   LMP 08/24/2020   SpO2 96%   BMI 40.10 kg/m   Physical Exam Constitutional:      Appearance: Normal appearance.  Eyes:     General: No scleral icterus. Cardiovascular:     Rate and Rhythm: Normal rate and regular rhythm.  Pulmonary:     Effort: Pulmonary effort is normal.     Breath sounds: Normal breath sounds.  Neurological:     Mental Status: She is alert.  Psychiatric:        Mood and Affect: Mood normal.        Behavior: Behavior normal.     ------------------------------------------------------------------------------------------------------------------------------------------------------------------------------------------------------------------- Assessment and Plan  Diabetes mellitus, type 2 Updated A1c  ordered today.  She admits to intentionally withholding food to try and improve her weight in preparation for knee replacement.  She is eating a regular diet at this point.  Tolerated Mounjaro at current strength of 12.5 mg/week now.  Essential hypertension Blood pressure is on the low end.  Reducing lisinopril to 5 mg  Fibromyalgia She is having some increased pain in her joints and muscles.  Also related to her chronic fibromyalgia however will obtain inflammatory markers.   Meds ordered this encounter  Medications   lisinopril (ZESTRIL) 5 MG tablet    Sig: Take 1 tablet (5 mg total) by mouth daily.    Dispense:  90 tablet    Refill:  2    No follow-ups on file.    This visit occurred during the SARS-CoV-2 public health emergency.  Safety protocols were in place, including screening questions prior to the visit, additional usage of staff PPE, and extensive cleaning of exam room while observing appropriate contact time as indicated for disinfecting solutions.

## 2023-09-07 NOTE — Assessment & Plan Note (Signed)
She is having some increased pain in her joints and muscles.  Also related to her chronic fibromyalgia however will obtain inflammatory markers.

## 2023-09-08 ENCOUNTER — Other Ambulatory Visit (HOSPITAL_COMMUNITY): Payer: Self-pay

## 2023-09-08 MED ORDER — TRAZODONE HCL 50 MG PO TABS
50.0000 mg | ORAL_TABLET | Freq: Every day | ORAL | 0 refills | Status: DC
  Filled 2023-09-08: qty 30, 30d supply, fill #0

## 2023-09-09 ENCOUNTER — Encounter (HOSPITAL_COMMUNITY): Payer: Commercial Managed Care - PPO

## 2023-09-10 ENCOUNTER — Encounter: Payer: Self-pay | Admitting: Adult Health

## 2023-09-10 ENCOUNTER — Telehealth: Payer: Commercial Managed Care - PPO | Admitting: Adult Health

## 2023-09-10 DIAGNOSIS — F4310 Post-traumatic stress disorder, unspecified: Secondary | ICD-10-CM

## 2023-09-10 DIAGNOSIS — F410 Panic disorder [episodic paroxysmal anxiety] without agoraphobia: Secondary | ICD-10-CM | POA: Diagnosis not present

## 2023-09-10 DIAGNOSIS — F411 Generalized anxiety disorder: Secondary | ICD-10-CM | POA: Diagnosis not present

## 2023-09-10 DIAGNOSIS — F331 Major depressive disorder, recurrent, moderate: Secondary | ICD-10-CM

## 2023-09-10 DIAGNOSIS — F603 Borderline personality disorder: Secondary | ICD-10-CM

## 2023-09-10 DIAGNOSIS — F339 Major depressive disorder, recurrent, unspecified: Secondary | ICD-10-CM | POA: Diagnosis not present

## 2023-09-10 MED ORDER — CLONAZEPAM 1 MG PO TABS
1.0000 mg | ORAL_TABLET | Freq: Three times a day (TID) | ORAL | 2 refills | Status: DC
  Filled 2023-09-10 – 2023-10-09 (×2): qty 90, 30d supply, fill #0
  Filled 2023-11-17: qty 90, 30d supply, fill #1
  Filled 2024-01-03: qty 90, 30d supply, fill #2

## 2023-09-10 MED ORDER — TRAZODONE HCL 100 MG PO TABS
100.0000 mg | ORAL_TABLET | Freq: Every day | ORAL | 1 refills | Status: DC
  Filled 2023-09-10: qty 90, 90d supply, fill #0
  Filled 2023-12-09: qty 90, 90d supply, fill #1

## 2023-09-10 MED ORDER — RISPERIDONE 2 MG PO TABS
2.0000 mg | ORAL_TABLET | Freq: Every day | ORAL | 1 refills | Status: DC
  Filled 2023-09-10: qty 90, 90d supply, fill #0
  Filled 2023-12-09: qty 90, 90d supply, fill #1

## 2023-09-10 NOTE — Progress Notes (Signed)
 Tracy [name] [id number] [birth year] 58 y.o.  Virtual Visit via Video Note  I connected with pt @ on 09/10/23 at  5:30 PM EST by a video enabled telemedicine application and verified that I am speaking with the correct person using two identifiers.   I discussed the limitations of evaluation and management by telemedicine and the availability of in person appointments. The patient expressed understanding and agreed to proceed.  I discussed the assessment and treatment plan with the patient. The patient was provided an opportunity to ask questions and all were answered. The patient agreed with the plan and demonstrated an understanding of the instructions.   The patient was advised to call back or seek an in-person evaluation if the symptoms worsen or if the condition fails to improve as anticipated.  I provided 25 minutes of non-face-to-face time during this encounter.  The patient was located at home.  The provider was located at [facility].   Tracy LOISE Sayers, NP   Subjective:   Patient ID:  Tracy [name] is a 58 y.o. (DOB Jul 08, 1966) female.  Chief Complaint: No chief complaint on file.   HPI Tracy [name] presents for follow-up of MDD, GAD, PTSD, and Panic attacks.  Describes mood today as not good. Pleasant. Reports tearfulness. Mood symptoms - reports depression, anxiety and irritability. Denies recent panic attacks. Reports worry, rumination, and over thinking. Reports intrusive thoughts. Reports flashbacks. Reports feeling frustrated. Mood is lower. Stating I don't feel like I'm doing too well. Feels like medications are helpful, but would like to consider other options. Seeing therapist regularly. Decreased interest and motivation. Taking medications as prescribed. Energy levels lower. Active, does not have a regular exercise routine. Walking dogs. Enjoys some usual interests and activities. Lives alone with 3 dogs. Divorced. Siblings local. Spending time with  family. Attends church. Appetite adequate. Weight loss - 284 pounds.  Reports difficulties with sleep. Averages 3 to 5  hours. Sleep apnea - using CPAP machine - taking Nuvigil  200mg  in the morning.  Denies SI or HI. Denies AH or VH.  Denies self harm. Denies substance use.  Working with Donny Payor - therapy.  Previous medication trials:  Antipsychotics: Vraylar-TD, Latuda, Abilify, Geodon, Rexulti, Lybalvi, Risperdal   Mood Stabilizers - Depakote, Lithium, Lamictal , Equetro, Topamax , Trileptal, Gabapentin   SSRI - Zoloft , Lexapro, Celexa, Prozac , Viibryd, Trintellix , Prozac   SNRI - Pristiq, Effexor , Cymbalta   Wellbutrin - allergic reaction  Anti-anxiety - Buspar, Xanax , Clonazepam , Ativan, Valium   Sleep agents - Trazadone, Ambien , Restoril   Other - Deplin, Emsam, Serzone, Doxepin, Nuvigil , Vistaril   Previous treatment: ECT and TMS    Review of Systems:  Review of Systems  Musculoskeletal:  Negative for gait problem.  Neurological:  Negative for tremors.  Psychiatric/Behavioral:         Please refer to HPI    Medications: I have reviewed the patient's current medications.  Current Outpatient Medications  Medication Sig Dispense Refill   AMBULATORY NON FORMULARY MEDICATION Rollator for use daily. Elevated toilet seat with handlebars 1 each 0   Armodafinil  200 MG TABS Take 1 tablet (200 mg total) by mouth daily as needed (sleepiness). 90 tablet 0   Atogepant  (QULIPTA ) 60 MG TABS Take 1 tablet (60 mg total) by mouth daily. 30 tablet 11   Blood Glucose Monitoring Suppl (FREESTYLE LITE) w/Device KIT Use as directed. 1 kit 0   Blood Glucose Monitoring Suppl (FREESTYLE LITE) w/Device KIT Check fasting blood sugar every morning and before meals up to 4 times daily.  1 kit PRN   botulinum toxin Type A  (BOTOX ) 200 units injection Inject 200 Units into the muscle every 3 (three) months. 1 each 2   clobetasol  (TEMOVATE ) 0.05 % GEL Apply small amount to affected area twice  daily using finger. Gently pat dry with cotton gauze.  Do not eat,drink, or speak for 30 minutes to allow absorption. 30 g 2   clonazePAM  (KLONOPIN ) 1 MG tablet Take 1 tablet (1 mg total) by mouth 3 (three) times daily. 90 tablet 2   Continuous Glucose Sensor (DEXCOM G6 SENSOR) MISC Replace every 10 days.  Use to check blood sugar. 9 each 1   Continuous Glucose Transmitter (DEXCOM G6 TRANSMITTER) MISC Use to check continuous blood sugar.  Replace every 90 days 1 each 3   cyclobenzaprine  (FLEXERIL ) 10 MG tablet Take 1 tablet (10 mg total) by mouth 3 (three) times daily as needed for muscle spasms. 30 tablet 1   diclofenac  Sodium (VOLTAREN ) 1 % GEL Apply 1 Application topically 4 (four) times daily as needed (pain).     ferrous sulfate  325 (65 FE) MG EC tablet Take 1 tablet (325 mg total) by mouth in the morning and at bedtime. Take iron supplement 2 hours before or 4 hours after PPI or other antacids as this requires some level of gastric acidity to aid in absorption (Patient taking differently: Take 325 mg by mouth daily. Take iron supplement 2 hours before or 4 hours after PPI or other antacids as this requires some level of gastric acidity to aid in absorption) 60 tablet 5   fluocinonide  gel (LIDEX ) 0.05 % Apply topically 2 (two) times daily Gently pat the affected area(s) dry with cotton gauze. Place a small amount of gel on a finger and apply to ulcerations. No eating , drinking or speaking for 20 - 30 minutes to allow steroid to be absorbed. Gel can also be applied to 2x2 gauze placed against the affected area. 30 g 0   folic acid  (FOLVITE ) 1 MG tablet Take 1 tablet (1 mg total) by mouth daily. 90 tablet 1   furosemide  (LASIX ) 40 MG tablet Take 1 tablet (40 mg total) by mouth daily. 30 tablet 3   glucose blood (FREESTYLE LITE) test strip Use as instructed 100 each 12   glucose blood (FREESTYLE LITE) test strip Check fasting blood sugar every morning and before meals up to 4 times a day as directed.  100 strip 12   hydrOXYzine  (ATARAX ) 25 MG tablet Take 1 tablet (25 mg total) by mouth 3 (three) times daily as needed for anxiety. 90 tablet 3   insulin  lispro (HUMALOG  KWIKPEN) 100 UNIT/ML KwikPen Inject up to 16 units 3 times daily at mealtime per sliding scale. (Patient taking differently: Inject 3-16 Units into the skin 3 (three) times daily as needed (when taking steroids).) 15 mL 11   Insulin  Pen Needle (UNIFINE PENTIPS) 31G X 5 MM MISC Use as directed to inject insulin  100 each 3   ketorolac  (TORADOL ) 10 MG tablet Take 1 tablet (10 mg total) by mouth every 8 (eight) hours as needed. 20 tablet 0   lamoTRIgine  (LAMICTAL ) 200 MG tablet Take 2 tablets by mouth at bedtime. 180 tablet 1   Lancets 30G MISC Check fasting blood sugar every morning and before meals up to 4 times daily. 100 each 12   lidocaine  (LIDODERM ) 5 % Place 1 patch onto the skin every 12 (twelve) hours. Remove & Discard patch within 12 hours or as directed by MD 30  patch 0   lidocaine  (XYLOCAINE ) 5 % ointment Apply topically to affected areas 3 times daily as needed. 35.44 g 0   lisinopril  (ZESTRIL ) 5 MG tablet Take 1 tablet (5 mg total) by mouth daily. 90 tablet 2   meloxicam  (MOBIC ) 15 MG tablet Take 1 tablet by mouth once every morning with a meal for 2 weeks, then daily as needed for pain. 30 tablet 0   Menthol , Topical Analgesic, (BENGAY EX) Apply 1 Application topically daily as needed (pain).     metFORMIN  (GLUCOPHAGE -XR) 500 MG 24 hr tablet      neomycin-bacitracin -polymyxin [phone] OINT Apply topically 2 (two) times daily for 10 days To corners of mouth (Patient taking differently: Apply 1 Application topically 2 (two) times daily as needed (cracking/ raw skin around mouth).) 14 g 2   nystatin  (NYAMYC ) powder Apply 3 times daily as directed. 60 g 3   nystatin  ointment (MYCOSTATIN ) Apply to the skin 2 times daily to corners of mouth (Patient taking differently: Apply 1 Application topically 2 (two) times daily as  needed (raw/cracking skin around the mouth).) 15 g 2   ondansetron  (ZOFRAN ) 4 MG tablet Take 1 tablet (4 mg total) by mouth every 8 (eight) hours as needed for nausea or vomiting. 10 tablet 0   ondansetron  (ZOFRAN -ODT) 4 MG disintegrating tablet Dissolve 1 tablet (4 mg total) by mouth 2 (two) times daily as needed for nausea or vomiting. 20 tablet 1   pantoprazole  (PROTONIX ) 40 MG tablet Take 2 tablets (80 mg total) by mouth 2 (two) times daily. 60 tablet 3   pimecrolimus  (ELIDEL ) 1 % cream Apply a small amount to affected area 2 x daily. Gently pat area dry w/cotton gauze. No eating, drinking or speaking for 20 - 30 minutes to allow medicine to be absorbed. Can apply to 2x2 gauze & placed against the affected area. (Patient taking differently: Apply 1 Application topically 2 (two) times daily as needed (ulcers).) 60 g 3   pregabalin  (LYRICA ) 75 MG capsule Take 1 capsule (75 mg total) by mouth 3 (three) times daily. 90 capsule 3   Rimegepant Sulfate  (NURTEC) 75 MG TBDP Dissolve 1 tablet (75 mg total) by mouth once as needed for up to 1 dose (migraines). 16 tablet 3   risperiDONE  (RISPERDAL ) 1 MG tablet Take 1.5 tablets (1.5 mg total) by mouth at bedtime. 45 tablet 2   rosuvastatin  (CRESTOR ) 20 MG tablet TAKE 1 TABLET BY MOUTH DAILY. 90 tablet 3   sertraline  (ZOLOFT ) 100 MG tablet Take 2 tablets (200 mg total) by mouth every morning. 180 tablet 3   spironolactone  (ALDACTONE ) 50 MG tablet Take 1 tablet (50 mg total) by mouth 2 (two) times daily. 180 tablet 2   SYNTHROID  200 MCG tablet Take 1 tablet (200 mcg total) by mouth daily before breakfast. 90 tablet 1   tirzepatide  (MOUNJARO ) 12.5 MG/0.5ML Pen Inject 12.5 mg into the skin once a week. 6 mL 0   traMADol  (ULTRAM ) 50 MG tablet Take 2 tablets (100 mg total) by mouth every 8 (eight) hours as needed for moderate pain. 180 tablet 0   traZODone  (DESYREL ) 50 MG tablet Take 1 tablet (50 mg total) by mouth at bedtime. 30 tablet 0   Current  Facility-Administered Medications  Medication Dose Route Frequency Provider Last Rate Last Admin   botulinum toxin Type A  (BOTOX ) injection 155 Units  155 Units Intramuscular Once Yan, Yijun, MD        Medication Side Effects: None  Allergies:  Allergies  Allergen Reactions   Sulfa Antibiotics Itching, Swelling and Rash   Food     Walnut-mouth swelling/itching   Bupropion Hcl Itching and Rash   Codeine Itching    Past Medical History:  Diagnosis Date   Acanthosis nigricans, acquired 02/09/2015   Anxiety and depression 10/31/2014   Arthritis    back, knees, right elbow   Arthritis of left hip 06/25/2018   Bilateral swelling of feet and ankles    Bipolar disorder (HCC)    Borderline personality disorder (HCC)    Cancer (HCC)    oral lichen planus, uses mouth wash, cream   Carpal tunnel syndrome 09/06/2015   Chewing difficulty    Chronic left shoulder pain 05/17/2020   Constipation    Coronary artery disease 03/21/2021   COVID 10/08/2021   Cystic teratoma    BENIGN   Dental crowns present    Diabetes mellitus without complication (HCC)    Diabetes mellitus, type 2 (HCC) 06/10/2013   Diarrhea    Diverticulosis of colon without hemorrhage    DOE (dyspnea on exertion) 03/21/2021   Dyspepsia 12/14/2020   Essential hypertension 10/02/2007   Qualifier: Diagnosis of  By: Kassie MD, Sean A    Facial rash 06/16/2019   Female hirsutism 02/09/2015   Food allergy    Walnuts   GAD (generalized anxiety disorder)    Gastritis and gastroduodenitis    Gastroesophageal reflux disease    Grade II internal hemorrhoids    Greater trochanteric bursitis of left hip 07/24/2018   Hemorrhoids, internal 07/18/2017   History of kidney stones    History of migraine headaches    History of posttraumatic stress disorder (PTSD)    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/2015   Hypothyroidism    HYPOTHYROIDISM, POSTSURGICAL 10/02/2007   Qualifier: Diagnosis of  By: Kassie MD, Sean A    IBS (irritable  bowel syndrome)    Infertility associated with anovulation 02/09/2015   Insulin  resistance 02/09/2015   Joint pain    Leg swelling 09/21/2020   Lichen planus    Liver cirrhosis secondary to NASH (nonalcoholic steatohepatitis) (HCC)    Lower back pain 07/18/2017   MDD (major depressive disorder) 06/06/2015   Migraine 10/02/2007   Morbid obesity with BMI of 40.0-44.9, adult (HCC) 06/23/2020   NASH (nonalcoholic steatohepatitis)    Nausea without vomiting    Nonalcoholic fatty liver disease 02/09/2015   Obstructive sleep apnea 06/27/2014   Oral mucosal lesion 06/17/2019   Osteoarthritis    Other fatigue    PCOS (polycystic ovarian syndrome)    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   Qualifier: Diagnosis of  By: Kassie MD, Sean A    Trigeminal neuralgia    Unilateral primary osteoarthritis, left hip 02/10/2019   Vitamin D  deficiency     Family History  Problem Relation Age of Onset   Heart  disease Father    Depression Father    Heart attack Father    Sudden death Father    Anxiety disorder Father    Bipolar disorder Father    Obesity Father    Cancer Brother        THROAT cancer   Depression Brother    Anxiety disorder Brother    Depression Sister    Breast cancer Mother    Hypertension Mother    Cancer Mother    Depression Mother    Sleep apnea Mother     Obesity Mother    Depression Maternal Grandmother    Stroke Maternal Grandfather    Colon cancer Brother    Pancreatic cancer Neg Hx    Stomach cancer Neg Hx    Liver disease Neg Hx     Social History   Socioeconomic History   Marital status: Divorced    Spouse name: Not on file   Number of children: 0   Years of education: 16   Highest education level: Bachelor's degree (e.g., BA, AB, BS)  Occupational History   Occupation: RN-Telephone Triage Nurse    Employer: [employer]    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   Drug use: No   Sexual activity: Not Currently    Partners: Male    Birth control/protection: Condom  Other Topics Concern   Not on file  Social History Narrative   RN - [employer]   Regular exercise: no   Caffeine  use: 2 x daily   Right-handed.   Lives alone.   Social Drivers of Health   Financial Resource Strain: Medium Risk (09/03/2023)   Overall Financial Resource Strain (CARDIA)    Difficulty of Paying Living Expenses: Somewhat hard  Food Insecurity: No Food Insecurity (09/03/2023)   Hunger Vital Sign    Worried About Running Out of Food in the Last Year: Never true    Ran Out of Food in the Last Year: Never true  Transportation Needs: No Transportation Needs (09/03/2023)   PRAPARE - Administrator, Civil Service (Medical): No    Lack of Transportation (Non-Medical): No  Physical Activity: Unknown (09/03/2023)   Exercise Vital Sign    Days of Exercise  per Week: 0 days    Minutes of Exercise per Session: Not on file  Stress: Stress Concern Present (09/03/2023)   [employer] of Occupational Health - Occupational Stress Questionnaire    Feeling of Stress : Rather much  Social Connections: Moderately Isolated (09/03/2023)   Social Connection and Isolation Panel [NHANES]    Frequency of Communication with Friends and Family: More than three times a week    Frequency of Social Gatherings with Friends and Family: Once a week    Attends Religious Services: More than 4 times per year    Active Member of [employer] or Organizations: No    Attends Banker Meetings: Not  on file    Marital Status: Divorced  Intimate Partner Violence: Unknown (11/09/2021)   [facility]   HITS    Physically Hurt: Not on file    Insult or Talk Down To: Not on file    Threaten Physical Harm: Not on file    Scream or Curse: Not on file    Past Medical History, Surgical history, Social history, and Family history were reviewed and updated as appropriate.   Please see review of systems for further details on the patient's review from today.   Objective:   Physical Exam:  LMP 08/24/2020   Physical Exam Constitutional:      General: She is not in acute distress. Musculoskeletal:        General: No deformity.  Neurological:     Mental Status: She is alert and oriented to person, place, and time.     Coordination: Coordination normal.  Psychiatric:        Attention and Perception: Attention and perception normal. She does not perceive auditory or visual hallucinations.        Mood and Affect: Affect is not labile, blunt, angry or inappropriate.        Speech: Speech normal.        Behavior: Behavior normal.        Thought Content: Thought content normal. Thought content is not paranoid or delusional. Thought content does not include homicidal or suicidal ideation. Thought content does not include homicidal or suicidal  plan.        Cognition and Memory: Cognition and memory normal.        Judgment: Judgment normal.     Comments: Insight intact     Lab Review:     Component Value Date/Time   NA 141 09/04/2023 0000   K 4.2 09/04/2023 0000   CL 101 09/04/2023 0000   CO2 26 09/04/2023 0000   GLUCOSE 80 09/04/2023 0000   GLUCOSE 91 08/15/2023 1004   BUN 11 09/04/2023 0000   CREATININE 1.05 (H) 09/04/2023 0000   CREATININE 0.95 11/28/2022 1509   CALCIUM  9.5 09/04/2023 0000   PROT 6.8 09/04/2023 0000   ALBUMIN 4.3 09/04/2023 0000   AST 20 09/04/2023 0000   ALT 14 09/04/2023 0000   ALKPHOS 130 (H) 09/04/2023 0000   BILITOT 0.3 09/04/2023 0000   GFRNONAA >60 08/15/2023 1004   GFRNONAA 99 12/26/2020 0000   GFRAA 115 12/26/2020 0000       Component Value Date/Time   WBC 4.7 09/04/2023 0000   WBC 4.9 08/15/2023 1004   RBC 4.34 09/04/2023 0000   RBC 4.49 08/15/2023 1004   HGB 13.1 09/04/2023 0000   HCT 39.8 09/04/2023 0000   PLT 198 09/04/2023 0000   MCV 92 09/04/2023 0000   MCH 30.2 09/04/2023 0000   MCH 30.7 08/15/2023 1004   MCHC 32.9 09/04/2023 0000   MCHC 33.9 08/15/2023 1004   RDW 14.0 09/04/2023 0000   LYMPHSABS 1.7 09/04/2023 0000   MONOABS 0.3 08/15/2023 1004   EOSABS 0.1 09/04/2023 0000   BASOSABS 0.0 09/04/2023 0000    No results found for: POCLITH, LITHIUM   No results found for: PHENYTOIN, PHENOBARB, VALPROATE, CBMZ   .res Assessment: Plan:    Plan:  PDMP reviewed  Lamictal  - 2 - 200mg  daily Zoloft  100mg  - 2 daily Nuvigil  200mg  daily - takes every day Clonazepam  1mg  TID as needed for anxiety Hydroxyzine  25mg  TID - using 1 tablet at bedtime.   Increase  Risperdal  1.5 to 2mg  at hs  Increase Trazadone 50mg  to 100mg  - take one tablet at bedtime  Time spent with patient was 25 minutes. Greater than 50% of face to face time with patient was spent on counseling and coordination of care.      Donny Payor - therapist  RTC 3 months  25 minutes spent  dedicated to the care of this patient on the date of this encounter to include pre-visit review of records, ordering of medication, post visit documentation, and face-to-face time with the patient discussing MDD, GAD, PTSD, and Panic attacks.  Discussed continuing current medication regimen.  Patient advised to contact office with any questions, adverse effects, or acute worsening in signs and symptoms.  Counseled patient regarding potential benefits, risks, and side effects of Lamictal  to include potential risk of Stevens-Johnson syndrome. Advised patient to stop taking Lamictal  and contact office immediately if rash develops and to seek urgent medical attention if rash is severe and/or spreading quickly.   Discussed potential benefits, risk, and side effects of benzodiazepines to include potential risk of tolerance and dependence, as well as possible drowsiness. Advised patient not to drive if experiencing drowsiness and to take lowest possible effective dose to minimize risk of dependence and tolerance.  Discussed potential metabolic side effects associated with atypical antipsychotics, as well as potential risk for movement side effects. Advised pt to contact office if movement side effects occur.    There are no diagnoses linked to this encounter.   Please see After Visit Summary for patient specific instructions.  Future Appointments  Date Time Provider Department Center  09/10/2023  5:30 PM Arnecia Ector Nattalie, NP [facility] None    No orders of the defined types were placed in this encounter.     -------------------------------

## 2023-09-11 ENCOUNTER — Other Ambulatory Visit (HOSPITAL_COMMUNITY): Payer: Self-pay

## 2023-09-11 ENCOUNTER — Other Ambulatory Visit: Payer: Self-pay

## 2023-09-15 ENCOUNTER — Encounter: Payer: Self-pay | Admitting: Family Medicine

## 2023-09-15 ENCOUNTER — Other Ambulatory Visit (HOSPITAL_COMMUNITY): Payer: Self-pay

## 2023-09-15 ENCOUNTER — Other Ambulatory Visit: Payer: Self-pay

## 2023-09-15 MED ORDER — FOLIC ACID 1 MG PO TABS
1.0000 mg | ORAL_TABLET | Freq: Every day | ORAL | 1 refills | Status: DC
  Filled 2023-09-15: qty 90, 90d supply, fill #0
  Filled 2023-12-09: qty 90, 90d supply, fill #1

## 2023-09-16 ENCOUNTER — Ambulatory Visit (HOSPITAL_COMMUNITY): Admit: 2023-09-16 | Payer: Commercial Managed Care - PPO | Admitting: Orthopedic Surgery

## 2023-09-16 SURGERY — ARTHROPLASTY, KNEE, TOTAL
Anesthesia: Spinal | Site: Knee | Laterality: Left

## 2023-09-24 ENCOUNTER — Encounter (HOSPITAL_COMMUNITY): Payer: Self-pay

## 2023-09-24 ENCOUNTER — Other Ambulatory Visit: Payer: Self-pay | Admitting: Adult Health

## 2023-09-24 ENCOUNTER — Other Ambulatory Visit (HOSPITAL_COMMUNITY): Payer: Self-pay

## 2023-09-24 DIAGNOSIS — F332 Major depressive disorder, recurrent severe without psychotic features: Secondary | ICD-10-CM

## 2023-09-24 MED ORDER — LAMOTRIGINE 200 MG PO TABS
400.0000 mg | ORAL_TABLET | Freq: Every day | ORAL | 1 refills | Status: DC
  Filled 2023-09-24: qty 180, 90d supply, fill #0
  Filled 2024-01-03: qty 180, 90d supply, fill #1

## 2023-09-25 ENCOUNTER — Other Ambulatory Visit (HOSPITAL_COMMUNITY): Payer: Self-pay

## 2023-09-25 ENCOUNTER — Other Ambulatory Visit: Payer: Self-pay

## 2023-10-03 ENCOUNTER — Ambulatory Visit: Payer: Commercial Managed Care - PPO | Admitting: Family Medicine

## 2023-10-09 ENCOUNTER — Other Ambulatory Visit: Payer: Self-pay | Admitting: Family Medicine

## 2023-10-09 ENCOUNTER — Encounter: Payer: Self-pay | Admitting: Neurology

## 2023-10-10 ENCOUNTER — Other Ambulatory Visit: Payer: Self-pay

## 2023-10-10 ENCOUNTER — Other Ambulatory Visit (HOSPITAL_COMMUNITY): Payer: Self-pay

## 2023-10-10 ENCOUNTER — Telehealth: Payer: Self-pay

## 2023-10-10 MED ORDER — PANTOPRAZOLE SODIUM 40 MG PO TBEC
80.0000 mg | DELAYED_RELEASE_TABLET | Freq: Two times a day (BID) | ORAL | 3 refills | Status: DC
  Filled 2023-10-10: qty 60, 15d supply, fill #0
  Filled 2023-11-03: qty 60, 15d supply, fill #1
  Filled 2023-11-13 – 2023-11-17 (×2): qty 60, 15d supply, fill #2
  Filled 2023-12-09: qty 60, 15d supply, fill #3

## 2023-10-10 MED ORDER — ONDANSETRON 4 MG PO TBDP
4.0000 mg | ORAL_TABLET | Freq: Two times a day (BID) | ORAL | 1 refills | Status: DC | PRN
  Filled 2023-10-10: qty 20, 10d supply, fill #0

## 2023-10-10 NOTE — Telephone Encounter (Signed)
 Call to patient, she reports migraine is better today. She feels tension in her neck. She does think she was dehydrated and that contributing. She has taken 2 650 mg tylenol arthritis tablets and that did help. She reports nasal drip on her right nostril. She states she ahs no needs for the upcoming weekend.

## 2023-10-13 ENCOUNTER — Encounter (INDEPENDENT_AMBULATORY_CARE_PROVIDER_SITE_OTHER): Payer: Self-pay | Admitting: Neurology

## 2023-10-13 DIAGNOSIS — G43709 Chronic migraine without aura, not intractable, without status migrainosus: Secondary | ICD-10-CM

## 2023-10-13 MED ORDER — TIZANIDINE HCL 4 MG PO TABS: 4.0000 mg | ORAL_TABLET | Freq: Four times a day (QID) | ORAL | 6 refills | Status: DC | PRN

## 2023-10-13 MED ORDER — SUMATRIPTAN SUCCINATE 100 MG PO TABS: 100.0000 mg | ORAL_TABLET | ORAL | 6 refills | Status: DC | PRN

## 2023-10-13 NOTE — Telephone Encounter (Signed)
 Last Botox injection was in October 2024, no follow-up is scheduled with our clinic  Options are 1.  If previous Botox injection was helping her migraine, we can reinitiate Botox prior authorization  2.  If she needs immediate attention abort her migraine now, may choose to come in for nerve block today  3.  Other option to abort her prolonged migraine are repeat Nurtec as needed, may combine with Zofran, Aleve,  I can call in muscle relaxant too,  4.  If she is still on daily qulipta.? Need follow up with NP for migraine care.

## 2023-10-13 NOTE — Telephone Encounter (Signed)
 I Called her, complains of headaches 7/10, x3 weeks,   Qulipta 60 daily, Nurtec as needed, does not help,   Imitrex  was helpful past, she has not tried yet,  Meds ordered this encounter  Medications   SUMAtriptan (IMITREX) 100 MG tablet    Sig: Take 1 tablet (100 mg total) by mouth every 2 (two) hours as needed for migraine. May repeat in 2 hours if headache persists or recurs.    Dispense:  10 tablet    Refill:  6   tiZANidine (ZANAFLEX) 4 MG tablet    Sig: Take 1 tablet (4 mg total) by mouth every 6 (six) hours as needed.    Dispense:  30 tablet    Refill:  6      May combine with Zofran, Aleve as needed for prolonged severe headaches,  If she continue to have headaches, may consider nerve block,  Botox every 3 months was helping her headache, may give her Botox injection visit with NP  Please see the MyChart message reply(ies) for my assessment and plan.    This patient gave consent for this Medical Advice Message and is aware that it may result in a bill to [employer], as well as the possibility of receiving a bill for a co-payment or deductible. They are an established patient, but are not seeking medical advice exclusively about a problem treated during an in person or video visit in the last seven days. I did not recommend an in person or video visit within seven days of my reply.    I spent a total of 12 minutes cumulative time within 7 days through [employer].  Levert Feinstein, MD

## 2023-10-13 NOTE — Addendum Note (Signed)
 Addended by: Levert Feinstein on: 10/13/2023 05:18 PM   Modules accepted: Orders

## 2023-10-14 ENCOUNTER — Telehealth: Payer: Self-pay | Admitting: Neurology

## 2023-10-14 NOTE — Telephone Encounter (Signed)
 Submitted auth request via CMM, status is pending. Key: [id number]

## 2023-10-15 ENCOUNTER — Telehealth: Payer: Self-pay

## 2023-10-15 ENCOUNTER — Encounter: Payer: Self-pay | Admitting: Neurology

## 2023-10-15 ENCOUNTER — Telehealth: Payer: Self-pay | Admitting: Pharmacy Technician

## 2023-10-15 ENCOUNTER — Other Ambulatory Visit (HOSPITAL_COMMUNITY): Payer: Self-pay

## 2023-10-15 NOTE — Telephone Encounter (Signed)
 Pharmacy Patient Advocate Encounter   Received notification from CoverMyMeds that prior authorization for Qulipta 60MG  tablets is required/requested.   Insurance verification completed.   The patient is insured through [facility] .   Per test claim: Refill too soon. PA is not needed at this time. Medication was filled 09/24/2023. Next eligible fill date is 10/18/2023.

## 2023-10-15 NOTE — Telephone Encounter (Signed)
 Patient sent mychart message saying she has vertigo and letting us know it was so bad she was having to hold onto the walls. I spoke with patient and she is getting relief with migraines medications. In agreement for PCP to fax over referral for vertigo

## 2023-10-16 ENCOUNTER — Encounter: Payer: Self-pay | Admitting: Family Medicine

## 2023-10-16 ENCOUNTER — Other Ambulatory Visit: Payer: Self-pay | Admitting: Family Medicine

## 2023-10-17 ENCOUNTER — Other Ambulatory Visit: Payer: Self-pay

## 2023-10-17 ENCOUNTER — Other Ambulatory Visit (HOSPITAL_COMMUNITY): Payer: Self-pay

## 2023-10-20 ENCOUNTER — Other Ambulatory Visit (HOSPITAL_COMMUNITY): Payer: Self-pay

## 2023-10-20 ENCOUNTER — Other Ambulatory Visit (HOSPITAL_BASED_OUTPATIENT_CLINIC_OR_DEPARTMENT_OTHER): Payer: Self-pay

## 2023-10-20 ENCOUNTER — Other Ambulatory Visit: Payer: Self-pay

## 2023-10-20 MED ORDER — SYNTHROID 200 MCG PO TABS
200.0000 ug | ORAL_TABLET | Freq: Every day | ORAL | 1 refills | Status: DC
  Filled 2023-10-20: qty 90, 90d supply, fill #0
  Filled 2024-02-02: qty 90, 90d supply, fill #1

## 2023-10-20 NOTE — Telephone Encounter (Signed)
 Received approval, pt will fill through [facility]. We still have her Botox that was previously sent to [place].  Auth#: [id number] (10/16/23-10/14/24)

## 2023-10-21 ENCOUNTER — Other Ambulatory Visit (HOSPITAL_COMMUNITY): Payer: Self-pay

## 2023-10-22 ENCOUNTER — Other Ambulatory Visit (HOSPITAL_COMMUNITY): Payer: Self-pay

## 2023-10-23 ENCOUNTER — Other Ambulatory Visit (HOSPITAL_COMMUNITY): Payer: Self-pay

## 2023-10-23 DIAGNOSIS — K117 Disturbances of salivary secretion: Secondary | ICD-10-CM | POA: Diagnosis not present

## 2023-10-23 DIAGNOSIS — B370 Candidal stomatitis: Secondary | ICD-10-CM | POA: Diagnosis not present

## 2023-10-23 DIAGNOSIS — L438 Other lichen planus: Secondary | ICD-10-CM | POA: Diagnosis not present

## 2023-10-24 ENCOUNTER — Other Ambulatory Visit (HOSPITAL_COMMUNITY): Payer: Self-pay

## 2023-10-27 ENCOUNTER — Other Ambulatory Visit: Payer: Self-pay

## 2023-10-28 ENCOUNTER — Other Ambulatory Visit (HOSPITAL_COMMUNITY): Payer: Self-pay

## 2023-10-28 ENCOUNTER — Other Ambulatory Visit: Payer: Self-pay

## 2023-10-30 ENCOUNTER — Ambulatory Visit: Payer: Commercial Managed Care - PPO | Admitting: Adult Health

## 2023-10-31 ENCOUNTER — Other Ambulatory Visit: Payer: Self-pay

## 2023-11-03 ENCOUNTER — Other Ambulatory Visit: Payer: Self-pay

## 2023-11-03 ENCOUNTER — Encounter: Payer: Self-pay | Admitting: Family Medicine

## 2023-11-03 ENCOUNTER — Telehealth: Payer: Self-pay

## 2023-11-03 ENCOUNTER — Other Ambulatory Visit (HOSPITAL_COMMUNITY): Payer: Self-pay

## 2023-11-03 ENCOUNTER — Encounter: Payer: Self-pay | Admitting: Neurology

## 2023-11-03 ENCOUNTER — Other Ambulatory Visit: Payer: Self-pay | Admitting: Family Medicine

## 2023-11-03 MED ORDER — FUROSEMIDE 40 MG PO TABS
40.0000 mg | ORAL_TABLET | Freq: Every day | ORAL | 0 refills | Status: DC
  Filled 2023-11-03: qty 90, 90d supply, fill #0

## 2023-11-03 NOTE — Telephone Encounter (Signed)
 Pharmacy Patient Advocate Encounter   Received notification from Physician's Office that prior authorization for Qulipta 60MG  tablets is required/requested.   Insurance verification completed.   The patient is insured through [facility] .   Per test claim: PA required; PA submitted to above mentioned insurance via CoverMyMeds Key/confirmation #/EOC [id number] Status is pending

## 2023-11-03 NOTE — Telephone Encounter (Signed)
 Patient informed.

## 2023-11-03 NOTE — Telephone Encounter (Signed)
 I called patient, since she was started on Qulipta 60 mg daily in August 2024   she experienced significant improvement of her migraine headache,   with at least 2 days/months decrease in migraine frequency,  There was also decreased duration of migraine,  I did see her in October 2024 2 months after starting Qulipta for Botox injection as migraine prevention, note then was focused on the injection procedure  Hope above note will fulfill the prior authorization requirement.

## 2023-11-03 NOTE — Telephone Encounter (Signed)
 This is a renewal PA for Qulipta-PT has not been evaluated since starting the medication-Looks like PT has still been having migraines per the notes in chart. Insurance will need specific documentation addressing each of the questions below-I only answered yes to get all of them to populate so we can deal with all of them at one time. Note from 10-09-2023 only states it is doing a pretty good job and then Dr.Yan's message was      Please advise-Thanks!

## 2023-11-03 NOTE — Telephone Encounter (Signed)
 Pt sent in [facility] and needed his expedited due to being out. Can we provide sample since we will not be doing a virtual visit sooner?

## 2023-11-03 NOTE — Telephone Encounter (Signed)
 Ok to address above questions when come back for BOTOX injection on May 1st.

## 2023-11-03 NOTE — Telephone Encounter (Signed)
 Qulipta pa needed

## 2023-11-04 ENCOUNTER — Other Ambulatory Visit (HOSPITAL_COMMUNITY): Payer: Self-pay

## 2023-11-04 NOTE — Telephone Encounter (Signed)
 Pharmacy Patient Advocate Encounter  Received notification from [facility] that Prior Authorization for Qulipta 60MG  tablets has been APPROVED from 11/03/2023 to 11/02/2024. Unable to obtain price due to refill too soon rejection, last fill date 11/03/2023 next available fill date4/24/2025   PA #/Case ID/Reference #: PA Case ID #: [id number]

## 2023-11-05 ENCOUNTER — Other Ambulatory Visit: Payer: Self-pay

## 2023-11-05 NOTE — Telephone Encounter (Signed)
 Attempted call to patient. Left a voice mail message requesting a return call.

## 2023-11-06 ENCOUNTER — Other Ambulatory Visit (HOSPITAL_COMMUNITY): Payer: Self-pay

## 2023-11-07 NOTE — Telephone Encounter (Signed)
Attempted call again to patient. Left a voice mail message requesting a return call.

## 2023-11-10 DIAGNOSIS — K0510 Chronic gingivitis, plaque induced: Secondary | ICD-10-CM | POA: Diagnosis not present

## 2023-11-10 DIAGNOSIS — L438 Other lichen planus: Secondary | ICD-10-CM | POA: Diagnosis not present

## 2023-11-10 DIAGNOSIS — K117 Disturbances of salivary secretion: Secondary | ICD-10-CM | POA: Diagnosis not present

## 2023-11-10 DIAGNOSIS — B370 Candidal stomatitis: Secondary | ICD-10-CM | POA: Diagnosis not present

## 2023-11-11 ENCOUNTER — Other Ambulatory Visit: Payer: Self-pay | Admitting: Adult Health

## 2023-11-11 DIAGNOSIS — G4733 Obstructive sleep apnea (adult) (pediatric): Secondary | ICD-10-CM

## 2023-11-12 ENCOUNTER — Other Ambulatory Visit (HOSPITAL_COMMUNITY): Payer: Self-pay

## 2023-11-13 ENCOUNTER — Other Ambulatory Visit: Payer: Self-pay

## 2023-11-13 ENCOUNTER — Telehealth: Payer: Self-pay

## 2023-11-13 ENCOUNTER — Other Ambulatory Visit (HOSPITAL_COMMUNITY): Payer: Self-pay

## 2023-11-13 MED ORDER — ARMODAFINIL 200 MG PO TABS
200.0000 mg | ORAL_TABLET | Freq: Every day | ORAL | 0 refills | Status: DC | PRN
Start: 2023-11-13 — End: 2024-02-12
  Filled 2023-11-13: qty 60, 60d supply, fill #0

## 2023-11-13 NOTE — Telephone Encounter (Signed)
 Pharmacy Patient Advocate Encounter  Received notification from [facility] that Prior Authorization for Dexcom G6 Transmitter has been APPROVED from 11/13/23 to 11/12/24. Ran test claim, Copay is $90.00 for 1 transmitter which is a 90 day supply. This test claim was processed through [facility]- copay amounts may vary at other pharmacies due to pharmacy/plan contracts, or as the patient moves through the different stages of their insurance plan.   PA #/Case ID/Reference #: Yehuda Budd

## 2023-11-13 NOTE — Telephone Encounter (Signed)
 Pharmacy Patient Advocate Encounter   Received notification from Patient Pharmacy that prior authorization for Dexcom G6 Transmitter is required/requested.   Insurance verification completed.   The patient is insured through [facility] .   Per test claim: PA required; PA submitted to above mentioned insurance via CoverMyMeds Key/confirmation #/EOC [id number] Status is pending

## 2023-11-13 NOTE — Telephone Encounter (Signed)
 Pt notified via Mychart

## 2023-11-14 ENCOUNTER — Other Ambulatory Visit (HOSPITAL_COMMUNITY): Payer: Self-pay

## 2023-11-14 ENCOUNTER — Other Ambulatory Visit: Payer: Self-pay

## 2023-11-15 ENCOUNTER — Other Ambulatory Visit (HOSPITAL_COMMUNITY): Payer: Self-pay

## 2023-11-17 ENCOUNTER — Other Ambulatory Visit: Payer: Self-pay | Admitting: Family Medicine

## 2023-11-17 ENCOUNTER — Other Ambulatory Visit (HOSPITAL_COMMUNITY): Payer: Self-pay

## 2023-11-18 ENCOUNTER — Other Ambulatory Visit (HOSPITAL_COMMUNITY): Payer: Self-pay

## 2023-11-18 ENCOUNTER — Other Ambulatory Visit: Payer: Self-pay

## 2023-11-18 MED ORDER — MOUNJARO 12.5 MG/0.5ML ~~LOC~~ SOAJ
12.5000 mg | SUBCUTANEOUS | 0 refills | Status: DC
  Filled 2023-11-18: qty 6, 84d supply, fill #0

## 2023-11-19 ENCOUNTER — Other Ambulatory Visit: Payer: Self-pay

## 2023-11-20 ENCOUNTER — Other Ambulatory Visit: Payer: Self-pay

## 2023-11-20 DIAGNOSIS — F3181 Bipolar II disorder: Secondary | ICD-10-CM | POA: Diagnosis not present

## 2023-11-27 ENCOUNTER — Encounter: Payer: Self-pay | Admitting: Family Medicine

## 2023-11-27 ENCOUNTER — Telehealth: Payer: Self-pay | Admitting: Neurology

## 2023-11-27 NOTE — Telephone Encounter (Signed)
 Appointment R/S due to work conflict

## 2023-11-28 ENCOUNTER — Telehealth (INDEPENDENT_AMBULATORY_CARE_PROVIDER_SITE_OTHER): Admitting: Medical-Surgical

## 2023-11-28 DIAGNOSIS — R2689 Other abnormalities of gait and mobility: Secondary | ICD-10-CM

## 2023-11-28 DIAGNOSIS — R296 Repeated falls: Secondary | ICD-10-CM

## 2023-11-28 NOTE — Progress Notes (Signed)
 Virtual Visit via Video Note  I connected with Tracy [name] on 11/30/23 at 11:10 AM EDT by a video enabled telemedicine application and verified that I am speaking with the correct person using two identifiers.   I discussed the limitations of evaluation and management by telemedicine and the availability of in person appointments. The patient expressed understanding and agreed to proceed.  Patient location: home Provider locations: office  Subjective:    CC: frequent falls  HPI: Pleasant 58 year old female presenting via MyChart video visit with reports of frequent falls and balance problems. Most recently, she was sitting on a metal chair outside when she leaned over, lost her balance, and she and the chair toppled over. She had superficial abrasions and residual pain but no serious injury. She was unable to get up by herself and lives along. EMS was called and they had to come out to help her get up. This was her 3rd fall this year. Reports she gets out of balance even when standing still with this happening several times daily. States that her symptoms are not like vertigo. No identifiable trigger to these episodes. Her mother passed away from a brain bleed secondary to a fall and she is very worried that this will happen to her too. Would like a referral to neurology for further evaluation.    Past medical history, Surgical history, Family history not pertinant except as noted below, Social history, Allergies, and medications have been entered into the medical record, reviewed, and corrections made.   Review of Systems: See HPI for pertinent positives and negatives.   Objective:    General: Speaking clearly in complete sentences without any shortness of breath.  Alert and oriented x3.  Normal judgment. No apparent acute distress.  Impression and Recommendations:    1. Balance problem (Primary) 2. Frequent falls Unclear etiology. Her feeling of being off balance is not related to  activity, head position, abrupt position changes, or stroke-like symptoms. Referring to Neurology for further evaluation per patient request. Unable to do an exam due to virtual nature of the appointment.  - Ambulatory referral to Neurology   I discussed the assessment and treatment plan with the patient. The patient was provided an opportunity to ask questions and all were answered. The patient agreed with the plan and demonstrated an understanding of the instructions.   The patient was advised to call back or seek an in-person evaluation if the symptoms worsen or if the condition fails to improve as anticipated.  Return if symptoms worsen or fail to improve.  Tracy Snook, DNP, APRN, FNP-BC [facility]

## 2023-11-28 NOTE — Telephone Encounter (Signed)
 Patient scheduled.

## 2023-11-29 ENCOUNTER — Encounter: Payer: Self-pay | Admitting: Neurology

## 2023-11-30 ENCOUNTER — Encounter: Payer: Self-pay | Admitting: Medical-Surgical

## 2023-12-01 ENCOUNTER — Encounter: Payer: Self-pay | Admitting: Medical-Surgical

## 2023-12-01 ENCOUNTER — Encounter: Payer: Self-pay | Admitting: Family Medicine

## 2023-12-01 NOTE — Telephone Encounter (Signed)
 PCP placed a neurology referral and was sent to [facility] neurology (PCP did not specify where referral was sent to, you can see this under referral section in epic). This will have to be cleared up by her PCP to update this referral to our location to see Dr. Gracie Lav.

## 2023-12-01 NOTE — Telephone Encounter (Signed)
 Change her appt with me, cancel injection appt with Jessica

## 2023-12-01 NOTE — Telephone Encounter (Signed)
 Per epic review, PCP referral for neurology was sent to [facility] yesterday.

## 2023-12-04 ENCOUNTER — Ambulatory Visit: Admitting: Adult Health

## 2023-12-09 ENCOUNTER — Other Ambulatory Visit: Payer: Self-pay | Admitting: Family Medicine

## 2023-12-10 ENCOUNTER — Other Ambulatory Visit: Payer: Self-pay

## 2023-12-10 ENCOUNTER — Other Ambulatory Visit (HOSPITAL_COMMUNITY): Payer: Self-pay

## 2023-12-10 MED ORDER — ROSUVASTATIN CALCIUM 20 MG PO TABS
20.0000 mg | ORAL_TABLET | Freq: Every day | ORAL | 3 refills | Status: AC
  Filled 2023-12-10: qty 90, 90d supply, fill #0
  Filled 2024-03-05 – 2024-04-06 (×2): qty 90, 90d supply, fill #1
  Filled 2024-07-01: qty 90, 90d supply, fill #2

## 2023-12-11 DIAGNOSIS — F3181 Bipolar II disorder: Secondary | ICD-10-CM | POA: Diagnosis not present

## 2023-12-17 DIAGNOSIS — F3181 Bipolar II disorder: Secondary | ICD-10-CM | POA: Diagnosis not present

## 2023-12-23 DIAGNOSIS — F3181 Bipolar II disorder: Secondary | ICD-10-CM | POA: Diagnosis not present

## 2023-12-25 ENCOUNTER — Ambulatory Visit: Admitting: Neurology

## 2023-12-25 ENCOUNTER — Other Ambulatory Visit (HOSPITAL_BASED_OUTPATIENT_CLINIC_OR_DEPARTMENT_OTHER): Payer: Self-pay

## 2023-12-25 ENCOUNTER — Encounter: Payer: Self-pay | Admitting: Neurology

## 2023-12-25 VITALS — BP 127/79 | HR 70 | Wt 287.0 lb

## 2023-12-25 DIAGNOSIS — G43709 Chronic migraine without aura, not intractable, without status migrainosus: Secondary | ICD-10-CM

## 2023-12-25 DIAGNOSIS — L988 Other specified disorders of the skin and subcutaneous tissue: Secondary | ICD-10-CM | POA: Diagnosis not present

## 2023-12-25 DIAGNOSIS — L438 Other lichen planus: Secondary | ICD-10-CM | POA: Diagnosis not present

## 2023-12-25 DIAGNOSIS — R269 Unspecified abnormalities of gait and mobility: Secondary | ICD-10-CM | POA: Diagnosis not present

## 2023-12-25 DIAGNOSIS — B370 Candidal stomatitis: Secondary | ICD-10-CM | POA: Diagnosis not present

## 2023-12-25 MED ORDER — QULIPTA 60 MG PO TABS
60.0000 mg | ORAL_TABLET | Freq: Every day | ORAL | 11 refills | Status: AC
Start: 2023-12-25 — End: ?
  Filled 2023-12-25 – 2024-01-14 (×2): qty 30, 30d supply, fill #0
  Filled 2024-02-12: qty 30, 30d supply, fill #1
  Filled 2024-03-10: qty 30, 30d supply, fill #2
  Filled 2024-04-13: qty 30, 30d supply, fill #3
  Filled 2024-05-09: qty 30, 30d supply, fill #4

## 2023-12-25 MED ORDER — LIDOCAINE VISCOUS HCL 2 % MT SOLN
5.0000 mL | Freq: Four times a day (QID) | OROMUCOSAL | 0 refills | Status: DC | PRN
  Filled 2023-12-25: qty 100, 5d supply, fill #0

## 2023-12-25 MED ORDER — FLUCONAZOLE 100 MG PO TABS
100.0000 mg | ORAL_TABLET | Freq: Every day | ORAL | 0 refills | Status: DC
  Filled 2023-12-25: qty 14, 14d supply, fill #0

## 2023-12-25 MED ORDER — PREDNISONE 20 MG PO TABS
ORAL_TABLET | ORAL | 0 refills | Status: AC
  Filled 2023-12-25: qty 24, 12d supply, fill #0

## 2023-12-25 MED ORDER — SUMATRIPTAN SUCCINATE 100 MG PO TABS
100.0000 mg | ORAL_TABLET | ORAL | 11 refills | Status: DC | PRN
  Filled 2023-12-25: qty 10, 30d supply, fill #0

## 2023-12-25 NOTE — Patient Instructions (Signed)
 [facility] in [place]  Address: [address] [place], [place] [address] Phone: [phone]

## 2023-12-25 NOTE — Progress Notes (Signed)
 Chief Complaint  Patient presents with   New Patient (Initial Visit)    Rm16, alone,  NX Gracie Lav '24/ Internal referral for balance concerns and falls: pt stated that she sees flashing lights in left eye only (ongoing 2 weeks), gait abnormality, frequent falls (falls screening completed), equilibrium seems of kilter. Pt is also seen for migraines and would like referral to [facility] due to ocular migraines. Migraines occur minimum of 8 monthly. Currently having migraine and wanted to know if is eligible for botox  and its been 6 months since last injection. Or a nerve block      ASSESSMENT AND PLAN  Tracy [name] is a 58 y.o. female   Gait abnormality  Most likely related to her sedentary lifestyle, moderate left knee pain, morbid obesity, deconditioning, occasional side effect,  Physical therapy  Laboratory evaluation including TSH Chronic migraine  Doing better with current dose of Qulipta  60 mg daily  Imitrex  as needed    DIAGNOSTIC DATA (LABS, IMAGING, TESTING) - I reviewed patient records, labs, notes, testing and imaging myself where available.   MEDICAL HISTORY:  Tracy [name], 58 year old female seen in request by her primary care physician Dr. Augustus Ledger, Aletha Hutching, for evaluation of migraine  I reviewed and summarized the referring note. PMHX. Diabetes  Hypothyroidism, Type II bipolar disorder, GERD Obstructive sleep apnea. Lumbar and cervical decompression surgery Left hip replacement Bilateral Achilles reconstruction. Right rotator cuff repair  She had long history of migraine, often came on suddenly, lateralized severe pounding headache with light, noise sensitivity, worsening by movement, blurry vision, since January 2024, she reported increased frequency of headache, up to 4 times a week, tried Maxalt  , Nurtec, over-the-counter medication, Fioricet  did not help, ice pack, sleep is helpful, headache can last for hours to days,  For preventive medication, she has  tried Topamax  in the past with limited help, now on polypharmacy including Lyrica , Zoloft , Risperdal , lamotrigine , clonazepam ,  She denies visual change, recent eye examination showed no significant abnormality CT head was normal in Feb 2021  UPDATE Dec 25 2023: Her migraine overall is under better control taking Qulipta  60 mg daily, Imitrex  as needed, uses about 10 tablets each month,  Today her main concern is unsteady gait, frequent falling  She lives alone, complains a lot of stress since divorce in 2022, on polypharmacy for her mood disorder including Risperdal , not sleeping well, feeling tired all the time, 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  PHYSICAL EXAMNIATION:  Gen: NAD, conversant, well nourised, well groomed                     Cardiovascular: Regular rate rhythm, no peripheral edema, warm, nontender. Eyes: Conjunctivae clear without exudates or hemorrhage Neck: Supple, no carotid bruits. Pulmonary: Clear to auscultation bilaterally   NEUROLOGICAL EXAM:  MENTAL STATUS: Speech/cognition: tired looking middle-age female, obese, awake, alert, oriented to history taking and casual conversation CRANIAL NERVES: CN II: Visual fields are full to confrontation.  Pupils are round equal and briskly reactive to light.  Funduscopy examination showed sharp disc bilaterally CN III, IV, VI: extraocular movement are normal. No ptosis. CN V: Facial sensation is intact to light touch CN VII: Face is symmetric  with normal eye closure  CN VIII: Hearing is normal to causal conversation. CN IX, X: Phonation is normal. CN XI: Head turning and shoulder shrug are intact  MOTOR: There is no pronator drift of out-stretched arms. Muscle bulk and tone are normal. Muscle strength is normal.  REFLEXES: Reflexes are 2+ and symmetric at the biceps, triceps, knees, and trace at ankles. Plantar responses are flexor.  SENSORY: Intact to light touch, pinprick and vibratory sensation are intact in fingers and toes.  COORDINATION: There is no trunk or limb dysmetria noted.  GAIT/STANCE: Push-up to get up from seated position, cautious  REVIEW OF SYSTEMS:  Full 14 system review of systems performed and notable only for as above All other review of systems were negative.   ALLERGIES: Allergies  Allergen Reactions   Sulfa Antibiotics Itching, Swelling and Rash   Food     Walnut-mouth swelling/itching   Tilactase Swelling    Walnut-mouth swelling/itching   Bupropion Hcl Itching and Rash   Codeine Itching    HOME MEDICATIONS: Current Outpatient Medications  Medication Sig Dispense Refill   AMBULATORY NON FORMULARY MEDICATION Rollator for use daily. Elevated toilet seat with handlebars 1 each 0   Armodafinil  200 MG TABS Take 1 tablet (200 mg total) by mouth daily as needed (sleepiness). 60 tablet 0   Atogepant  (QULIPTA ) 60 MG TABS Take 1 tablet (60 mg total) by mouth daily. 30 tablet 11   Blood Glucose Monitoring Suppl (FREESTYLE LITE) w/Device KIT Use as directed. 1 kit 0   Blood Glucose Monitoring Suppl (FREESTYLE LITE) w/Device KIT Check fasting blood sugar every morning and before meals up to 4 times daily. 1 kit PRN   botulinum toxin Type A  (BOTOX ) 200 units injection Inject 200 Units into the muscle every 3 (three) months. 1 each 2   clobetasol  (TEMOVATE ) 0.05 % GEL Apply small amount to affected area twice daily using finger. Gently pat dry with cotton gauze.  Do not eat,drink, or speak for 30  minutes to allow absorption. 30 g 2   clonazePAM  (KLONOPIN ) 1 MG tablet Take 1 tablet (1 mg total) by mouth 3 (three) times daily. 90 tablet 2   Continuous Glucose Sensor (DEXCOM G6 SENSOR) MISC Replace every 10 days.  Use to check blood sugar. 9 each 1   Continuous Glucose Transmitter (DEXCOM G6 TRANSMITTER) MISC Use to check continuous blood sugar.  Replace every 90 days 1 each 3   cyclobenzaprine  (FLEXERIL ) 10 MG tablet Take 1 tablet (10 mg total) by mouth 3 (three) times daily as needed for muscle spasms. 30 tablet 1   diclofenac  Sodium (VOLTAREN ) 1 % GEL Apply 1 Application topically 4 (four) times daily as needed (pain).     fluconazole  (DIFLUCAN ) 100 MG tablet Take 1 tablet (100 mg total) by mouth daily for 14 days 14 tablet 0   fluocinonide  gel (LIDEX ) 0.05 % Apply topically 2 (two) times daily Gently pat the affected area(s) dry with cotton gauze. Place a small amount of gel on a finger and apply to ulcerations. No eating , drinking or speaking for 20 - 30 minutes to allow steroid to be absorbed. Gel can also be applied to 2x2 gauze placed against the affected area. 30 g 0   folic acid  (FOLVITE )  1 MG tablet Take 1 tablet (1 mg total) by mouth daily. 90 tablet 1   furosemide  (LASIX ) 40 MG tablet Take 1 tablet (40 mg total) by mouth daily. 90 tablet 0   glucose blood (FREESTYLE LITE) test strip Use as instructed 100 each 12   glucose blood (FREESTYLE LITE) test strip Check fasting blood sugar every morning and before meals up to 4 times a day as directed. 100 strip 12   hydrOXYzine  (ATARAX ) 25 MG tablet Take 1 tablet (25 mg total) by mouth 3 (three) times daily as needed for anxiety. 90 tablet 3   insulin  lispro (HUMALOG  KWIKPEN) 100 UNIT/ML KwikPen Inject up to 16 units 3 times daily at mealtime per sliding scale. (Patient taking differently: Inject 3-16 Units into the skin 3 (three) times daily as needed (when taking steroids).) 15 mL 11   Insulin  Pen Needle (UNIFINE PENTIPS) 31G X 5 MM MISC  Use as directed to inject insulin  100 each 3   ketorolac  (TORADOL ) 10 MG tablet Take 1 tablet (10 mg total) by mouth every 8 (eight) hours as needed. 20 tablet 0   lamoTRIgine  (LAMICTAL ) 200 MG tablet Take 2 tablets (400 mg total) by mouth at bedtime. 180 tablet 1   Lancets 30G MISC Check fasting blood sugar every morning and before meals up to 4 times daily. 100 each 12   lidocaine  (LIDODERM ) 5 % Place 1 patch onto the skin every 12 (twelve) hours. Remove & Discard patch within 12 hours or as directed by MD 30 patch 0   lidocaine  (XYLOCAINE ) 2 % solution Swish and spit 5 mLs in the mouth or throat 4 (four) times daily as needed for pain for up to 14 days 100 mL 0   lidocaine  (XYLOCAINE ) 5 % ointment Apply topically to affected areas 3 times daily as needed. 35.44 g 0   lisinopril  (ZESTRIL ) 5 MG tablet Take 1 tablet (5 mg total) by mouth daily. 90 tablet 2   meloxicam  (MOBIC ) 15 MG tablet Take 1 tablet by mouth once every morning with a meal for 2 weeks, then daily as needed for pain. 30 tablet 0   Menthol , Topical Analgesic, (BENGAY EX) Apply 1 Application topically daily as needed (pain).     neomycin-bacitracin -polymyxin [phone] OINT Apply topically 2 (two) times daily for 10 days To corners of mouth (Patient taking differently: Apply 1 Application topically 2 (two) times daily as needed (cracking/ raw skin around mouth).) 14 g 2   nystatin  (NYAMYC ) powder Apply 3 times daily as directed. 60 g 3   nystatin  ointment (MYCOSTATIN ) Apply to the skin 2 times daily to corners of mouth (Patient taking differently: Apply 1 Application topically 2 (two) times daily as needed (raw/cracking skin around the mouth).) 15 g 2   ondansetron  (ZOFRAN -ODT) 4 MG disintegrating tablet Dissolve 1 tablet (4 mg total) by mouth 2 (two) times daily as needed for nausea or vomiting. 20 tablet 1   pantoprazole  (PROTONIX ) 40 MG tablet Take 2 tablets (80 mg total) by mouth 2 (two) times daily. 60 tablet 3   pimecrolimus   (ELIDEL ) 1 % cream Apply a small amount to affected area 2 x daily. Gently pat area dry w/cotton gauze. No eating, drinking or speaking for 20 - 30 minutes to allow medicine to be absorbed. Can apply to 2x2 gauze & placed against the affected area. (Patient taking differently: Apply 1 Application topically 2 (two) times daily as needed (ulcers).) 60 g 3   predniSONE  (DELTASONE ) 20 MG tablet Take  3 tablets (60 mg total) by mouth daily for 4 days, THEN 2 tablets (40 mg total) daily for 4 days, THEN 1 tablet (20 mg total) daily for 4 days. 24 tablet 0   pregabalin  (LYRICA ) 75 MG capsule Take 1 capsule (75 mg total) by mouth 3 (three) times daily. 90 capsule 3   Rimegepant Sulfate  (NURTEC) 75 MG TBDP Dissolve 1 tablet (75 mg total) by mouth once as needed for up to 1 dose (migraines). 16 tablet 3   risperiDONE  (RISPERDAL ) 2 MG tablet Take 1 tablet (2 mg total) by mouth at bedtime. 90 tablet 1   rosuvastatin  (CRESTOR ) 20 MG tablet Take 1 tablet (20 mg total) by mouth daily. 90 tablet 3   sertraline  (ZOLOFT ) 100 MG tablet Take 2 tablets (200 mg total) by mouth every morning. 180 tablet 3   spironolactone  (ALDACTONE ) 50 MG tablet Take 1 tablet (50 mg total) by mouth 2 (two) times daily. 180 tablet 2   SUMAtriptan  (IMITREX ) 100 MG tablet Take 1 tablet (100 mg total) by mouth every 2 (two) hours as needed for migraine. May repeat in 2 hours if headache persists or recurs. 10 tablet 6   SYNTHROID  200 MCG tablet Take 1 tablet (200 mcg total) by mouth daily before breakfast. 90 tablet 1   tirzepatide  (MOUNJARO ) 12.5 MG/0.5ML Pen Inject 12.5 mg into the skin once a week. 6 mL 0   tiZANidine  (ZANAFLEX ) 4 MG tablet Take 1 tablet (4 mg total) by mouth every 6 (six) hours as needed. 30 tablet 6   traMADol  (ULTRAM ) 50 MG tablet Take 2 tablets (100 mg total) by mouth every 8 (eight) hours as needed for moderate pain. 180 tablet 0   traZODone  (DESYREL ) 100 MG tablet Take 1 tablet (100 mg total) by mouth at bedtime. 90  tablet 1   Current Facility-Administered Medications  Medication Dose Route Frequency Provider Last Rate Last Admin   botulinum toxin Type A  (BOTOX ) injection 155 Units  155 Units Intramuscular Once Phebe Brasil, MD        PAST MEDICAL HISTORY: Past Medical History:  Diagnosis Date   Acanthosis nigricans, acquired 02/09/2015   Anxiety and depression 10/31/2014   Arthritis    back, knees, right elbow   Arthritis of left hip 06/25/2018   Bilateral swelling of feet and ankles    Bipolar disorder (HCC)    Borderline personality disorder (HCC)    Cancer (HCC)    oral lichen planus, uses mouth wash, cream   Carpal tunnel syndrome 09/06/2015   Chewing difficulty    Chronic left shoulder pain 05/17/2020   Constipation    Coronary artery disease 03/21/2021   COVID 10/08/2021   Cystic teratoma    BENIGN   Dental crowns present    Diabetes mellitus without complication (HCC)    Diabetes mellitus, type 2 (HCC) 06/10/2013   Diarrhea    Diverticulosis of colon without hemorrhage    DOE (dyspnea on exertion) 03/21/2021   Dyspepsia 12/14/2020   Essential hypertension 10/02/2007   Qualifier: Diagnosis of  By: Washington Hacker MD, Kaaren Ora A    Facial rash 06/16/2019   Female hirsutism 02/09/2015   Food allergy    Walnuts   GAD (generalized anxiety disorder)    Gastritis and gastroduodenitis    Gastroesophageal reflux disease    Grade II internal hemorrhoids    Greater trochanteric bursitis of left hip 07/24/2018   Hemorrhoids, internal 07/18/2017   History of kidney stones    History of migraine headaches  History of posttraumatic stress disorder (PTSD)    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/2015   Hypothyroidism    HYPOTHYROIDISM, POSTSURGICAL 10/02/2007   Qualifier: Diagnosis of  By: Washington Hacker MD, Sean A    IBS (irritable bowel syndrome)    Infertility associated with anovulation 02/09/2015   Insulin  resistance 02/09/2015   Joint pain    Leg swelling 09/21/2020   Lichen planus    Liver cirrhosis  secondary to NASH (nonalcoholic steatohepatitis) (HCC)    Lower back pain 07/18/2017   MDD (major depressive disorder) 06/06/2015   Migraine 10/02/2007   Morbid obesity with BMI of 40.0-44.9, adult (HCC) 06/23/2020   NASH (nonalcoholic steatohepatitis)    Nausea without vomiting    Nonalcoholic fatty liver disease 02/09/2015   Obstructive sleep apnea 06/27/2014   Oral mucosal lesion 06/17/2019   Osteoarthritis    Other fatigue    PCOS (polycystic ovarian syndrome)    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   POLYCYSTIC OVARIAN DISEASE 10/02/2007   Qualifier: Diagnosis of  By: Washington Hacker MD, Kaaren Ora A    PONV (postoperative nausea and vomiting)    also hx. 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   Qualifier: Diagnosis of  By: Washington Hacker MD, Sean A    Trigeminal neuralgia    Unilateral primary osteoarthritis, left hip 02/10/2019   Vitamin D  deficiency     PAST SURGICAL HISTORY: Past Surgical History:  Procedure Laterality Date   ACHILLES TENDON SURGERY Left 2007   APPENDECTOMY     CARPAL TUNNEL RELEASE Right 01/12/2016   Procedure: RIGHT CARPAL TUNNEL RELEASE;  Surgeon: Ronn Cohn, MD;  Location: [facility];  Service: Orthopedics;  Laterality: Right;   COLONOSCOPY WITH PROPOFOL  N/A 04/20/2014   Procedure: COLONOSCOPY WITH PROPOFOL ;  Surgeon: Evangeline Hilts, MD;  Location: [facility];  Service: Endoscopy;  Laterality: N/A;   COLONOSCOPY WITH PROPOFOL  N/A 01/10/2021   Procedure: COLONOSCOPY WITH PROPOFOL ;  Surgeon: Annis Kinder, DO;  Location: [facility];  Service: Gastroenterology;  Laterality: N/A;   ESOPHAGOGASTRODUODENOSCOPY (EGD) WITH PROPOFOL  N/A 04/20/2014   Procedure: ESOPHAGOGASTRODUODENOSCOPY (EGD) WITH PROPOFOL ;  Surgeon: Evangeline Hilts, MD;  Location: [facility];  Service: Endoscopy;  Laterality: N/A;   ESOPHAGOGASTRODUODENOSCOPY (EGD) WITH PROPOFOL  N/A 01/10/2021   Procedure: ESOPHAGOGASTRODUODENOSCOPY (EGD) WITH PROPOFOL ;  Surgeon: Annis Kinder, DO;  Location: [facility];  Service: Gastroenterology;  Laterality: N/A;   EXCISION HAGLUND'S DEFORMITY WITH ACHILLES TENDON REPAIR Right 02/25/2013   Procedure: RIGHT ACHILLES DEBRIDEMENT AND RECONSTRUCTION;  HAGLUND'S EXCISION; GASTROC RECESSION AND FLEXOR HALLUCIS LONGUS TRANSFER;  Surgeon: Amada Backer, MD;  Location: [facility];  Service: Orthopedics;  Laterality: Right;   IR ANGIOGRAM SELECTIVE EACH ADDITIONAL VESSEL  05/15/2023   IR ANGIOGRAM SELECTIVE EACH ADDITIONAL VESSEL  05/15/2023   IR ANGIOGRAM SELECTIVE EACH ADDITIONAL VESSEL  05/15/2023   IR ANGIOGRAM SELECTIVE EACH ADDITIONAL VESSEL  05/15/2023   IR EMBO ARTERIAL NOT HEMORR HEMANG INC GUIDE ROADMAPPING  05/15/2023   IR RADIOLOGIST EVAL & MGMT  04/16/2023   IR US  GUIDE VASC ACCESS LEFT  05/15/2023  JOINT REPLACEMENT     hip   LUMBAR LAMINECTOMY     X 3   neck fusion     OVARIAN CYST SURGERY Left 1998   PILONIDAL CYST EXCISION  1994   RIGHT OOPHORECTOMY     SHOULDER ARTHROSCOPY WITH ROTATOR CUFF REPAIR Right 10/17/2022   Procedure: Right shoulder arthroscopy, debridement, subacromial decompression, distal clavicle resection, rotator cuff repair;  Surgeon: Ellard Gunning, MD;  Location: [facility];  Service: Orthopedics;  Laterality: Right;    STERIOD INJECTION Left 01/12/2016   Procedure: STEROID INJECTION LEFT WRIST;  Surgeon: Ronn Cohn, MD;  Location: [facility];  Service: Orthopedics;  Laterality: Left;   TOTAL HIP ARTHROPLASTY Left 03/19/2019   Procedure: LEFT TOTAL  HIP ARTHROPLASTY ANTERIOR APPROACH;  Surgeon: Arnie Lao, MD;  Location: [facility];  Service: Orthopedics;  Laterality: Left;   TOTAL THYROIDECTOMY  2003   UPPER GI ENDOSCOPY  01/19/2015    FAMILY HISTORY: Family History  Problem Relation Age of Onset   Heart disease Father    Depression Father    Heart attack Father    Sudden death Father    Anxiety disorder Father    Bipolar disorder Father    Obesity Father    Cancer Brother        THROAT cancer   Depression Brother    Anxiety disorder Brother    Depression Sister    Breast cancer Mother    Hypertension Mother    Cancer Mother    Depression Mother    Sleep apnea Mother    Obesity Mother    Depression Maternal Grandmother    Stroke Maternal Grandfather    Colon cancer Brother    Pancreatic cancer Neg Hx    Stomach cancer Neg Hx    Liver disease Neg Hx     SOCIAL HISTORY: Social History   Socioeconomic History   Marital status: Divorced    Spouse name: Not on file   Number of children: 0   Years of education: 16   Highest education level: Bachelor's degree (e.g., BA, AB, BS)  Occupational History   Occupation: RN-Telephone Triage Nurse    Employer: [employer]    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   Drug use: No   Sexual activity: Not Currently    Partners: Male    Birth control/protection: Condom  Other Topics Concern   Not on file  Social History Narrative   RN - [place]   Regular exercise: no    Caffeine  use: 2 x daily   Right-handed.   Lives alone.   Social Drivers of Health   Financial Resource Strain: Medium Risk (09/03/2023)   Overall Financial Resource Strain (CARDIA)    Difficulty of Paying Living Expenses: Somewhat hard  Food Insecurity: No Food Insecurity (09/03/2023)   Hunger Vital Sign    Worried About Running Out of Food in  the Last Year: Never true    Ran Out of Food in the Last Year: Never true  Transportation Needs: No Transportation Needs (09/03/2023)   PRAPARE - Administrator, Civil Service (Medical): No    Lack of Transportation (Non-Medical): No  Physical Activity: Unknown (09/03/2023)   Exercise Vital Sign    Days of Exercise per Week: 0 days    Minutes of Exercise per Session: Not on file  Stress: Stress Concern Present (09/03/2023)   [employer] of Occupational Health - Occupational Stress Questionnaire    Feeling of Stress : Rather much  Social Connections: Moderately Isolated (09/03/2023)   Social Connection and Isolation Panel [NHANES]    Frequency of Communication with Friends and Family: More than three times a week    Frequency of Social Gatherings with Friends and Family: Once a week    Attends Religious Services: More than 4 times per year    Active Member of [employer] or Organizations: No    Attends Engineer, structural: Not on file    Marital Status: Divorced  Intimate Partner Violence: Unknown (11/09/2021)   Received from [employer], [facility]   HITS    Physically Hurt: Not on file    Insult or Talk Down To: Not on file    Threaten Physical Harm: Not on file    Scream or Curse: Not on file      Phebe Brasil, M.D. Ph.D.  [facility] [address] [place], [place] [address] Ph: [phone] Fax: [phone]  CC:  Cherre Cornish, NP [address] [name],  [place] [address]  Adela Holter, DO

## 2023-12-26 LAB — THYROID PANEL WITH TSH
Free Thyroxine Index: 1.8 (ref 1.2–4.9)
T3 Uptake Ratio: 21 % — ABNORMAL LOW (ref 24–39)
T4, Total: 8.4 ug/dL (ref 4.5–12.0)
TSH: 1.34 u[IU]/mL (ref 0.450–4.500)

## 2023-12-26 LAB — ACETYLCHOLINE RECEPTOR, BINDING: AChR Binding Ab, Serum: <0.07 nmol/L (ref 0.00–0.24)

## 2023-12-30 ENCOUNTER — Ambulatory Visit: Payer: Self-pay | Admitting: Neurology

## 2023-12-30 DIAGNOSIS — F3181 Bipolar II disorder: Secondary | ICD-10-CM | POA: Diagnosis not present

## 2023-12-31 ENCOUNTER — Ambulatory Visit: Admitting: Adult Health

## 2024-01-02 ENCOUNTER — Other Ambulatory Visit: Payer: Self-pay

## 2024-01-03 ENCOUNTER — Other Ambulatory Visit: Payer: Self-pay | Admitting: Family Medicine

## 2024-01-03 ENCOUNTER — Encounter (HOSPITAL_COMMUNITY): Payer: Self-pay

## 2024-01-03 ENCOUNTER — Other Ambulatory Visit (HOSPITAL_COMMUNITY): Payer: Self-pay

## 2024-01-05 ENCOUNTER — Other Ambulatory Visit: Payer: Self-pay

## 2024-01-05 ENCOUNTER — Other Ambulatory Visit (HOSPITAL_COMMUNITY): Payer: Self-pay

## 2024-01-05 MED ORDER — PANTOPRAZOLE SODIUM 40 MG PO TBEC
80.0000 mg | DELAYED_RELEASE_TABLET | Freq: Two times a day (BID) | ORAL | 3 refills | Status: DC
  Filled 2024-01-05: qty 240, 60d supply, fill #0

## 2024-01-05 MED ORDER — FUROSEMIDE 40 MG PO TABS
40.0000 mg | ORAL_TABLET | Freq: Every day | ORAL | 0 refills | Status: DC
  Filled 2024-01-05 – 2024-02-02 (×2): qty 90, 90d supply, fill #0

## 2024-01-06 ENCOUNTER — Other Ambulatory Visit: Payer: Self-pay

## 2024-01-07 ENCOUNTER — Other Ambulatory Visit: Payer: Self-pay

## 2024-01-08 ENCOUNTER — Other Ambulatory Visit (HOSPITAL_BASED_OUTPATIENT_CLINIC_OR_DEPARTMENT_OTHER): Payer: Self-pay

## 2024-01-08 ENCOUNTER — Encounter (HOSPITAL_BASED_OUTPATIENT_CLINIC_OR_DEPARTMENT_OTHER): Payer: Self-pay | Admitting: Pharmacy Technician

## 2024-01-08 DIAGNOSIS — L438 Other lichen planus: Secondary | ICD-10-CM | POA: Diagnosis not present

## 2024-01-08 DIAGNOSIS — B370 Candidal stomatitis: Secondary | ICD-10-CM | POA: Diagnosis not present

## 2024-01-08 MED ORDER — CLOTRIMAZOLE 10 MG MT TROC
10.0000 mg | Freq: Every day | OROMUCOSAL | 1 refills | Status: DC
  Filled 2024-01-08: qty 70, 70d supply, fill #0

## 2024-01-08 MED ORDER — TACROLIMUS 1 MG PO CAPS
1.0000 mg | ORAL_CAPSULE | Freq: Every day | ORAL | 1 refills | Status: DC
  Filled 2024-01-08: qty 20, 20d supply, fill #0

## 2024-01-09 ENCOUNTER — Other Ambulatory Visit: Payer: Self-pay

## 2024-01-09 ENCOUNTER — Other Ambulatory Visit (HOSPITAL_BASED_OUTPATIENT_CLINIC_OR_DEPARTMENT_OTHER): Payer: Self-pay

## 2024-01-14 ENCOUNTER — Other Ambulatory Visit (HOSPITAL_COMMUNITY): Payer: Self-pay

## 2024-01-14 DIAGNOSIS — F3181 Bipolar II disorder: Secondary | ICD-10-CM | POA: Diagnosis not present

## 2024-01-15 ENCOUNTER — Other Ambulatory Visit: Payer: Self-pay

## 2024-01-16 ENCOUNTER — Other Ambulatory Visit: Payer: Self-pay

## 2024-01-28 DIAGNOSIS — F3181 Bipolar II disorder: Secondary | ICD-10-CM | POA: Diagnosis not present

## 2024-01-29 DIAGNOSIS — K148 Other diseases of tongue: Secondary | ICD-10-CM | POA: Diagnosis not present

## 2024-01-29 DIAGNOSIS — L438 Other lichen planus: Secondary | ICD-10-CM | POA: Diagnosis not present

## 2024-01-29 DIAGNOSIS — K047 Periapical abscess without sinus: Secondary | ICD-10-CM | POA: Diagnosis not present

## 2024-02-03 ENCOUNTER — Other Ambulatory Visit: Payer: Self-pay

## 2024-02-03 ENCOUNTER — Other Ambulatory Visit (HOSPITAL_COMMUNITY): Payer: Self-pay

## 2024-02-04 ENCOUNTER — Other Ambulatory Visit (HOSPITAL_COMMUNITY): Payer: Self-pay

## 2024-02-04 ENCOUNTER — Other Ambulatory Visit: Payer: Self-pay

## 2024-02-04 DIAGNOSIS — K140 Glossitis: Secondary | ICD-10-CM | POA: Diagnosis not present

## 2024-02-04 DIAGNOSIS — Z7985 Long-term (current) use of injectable non-insulin antidiabetic drugs: Secondary | ICD-10-CM | POA: Diagnosis not present

## 2024-02-04 DIAGNOSIS — K0263 Dental caries on smooth surface penetrating into pulp: Secondary | ICD-10-CM | POA: Diagnosis not present

## 2024-02-04 DIAGNOSIS — I10 Essential (primary) hypertension: Secondary | ICD-10-CM | POA: Diagnosis not present

## 2024-02-04 DIAGNOSIS — Z7984 Long term (current) use of oral hypoglycemic drugs: Secondary | ICD-10-CM | POA: Diagnosis not present

## 2024-02-04 DIAGNOSIS — K219 Gastro-esophageal reflux disease without esophagitis: Secondary | ICD-10-CM | POA: Diagnosis not present

## 2024-02-04 DIAGNOSIS — K047 Periapical abscess without sinus: Secondary | ICD-10-CM | POA: Diagnosis not present

## 2024-02-04 DIAGNOSIS — K1379 Other lesions of oral mucosa: Secondary | ICD-10-CM | POA: Diagnosis not present

## 2024-02-04 DIAGNOSIS — E119 Type 2 diabetes mellitus without complications: Secondary | ICD-10-CM | POA: Diagnosis not present

## 2024-02-04 DIAGNOSIS — K148 Other diseases of tongue: Secondary | ICD-10-CM | POA: Diagnosis not present

## 2024-02-04 DIAGNOSIS — Z87891 Personal history of nicotine dependence: Secondary | ICD-10-CM | POA: Diagnosis not present

## 2024-02-04 MED ORDER — OXYCODONE HCL 5 MG PO TABS
ORAL_TABLET | ORAL | 0 refills | Status: DC
  Filled 2024-02-04 (×2): qty 12, 2d supply, fill #0

## 2024-02-05 ENCOUNTER — Other Ambulatory Visit: Payer: Self-pay

## 2024-02-11 ENCOUNTER — Encounter: Payer: Self-pay | Admitting: Family Medicine

## 2024-02-11 ENCOUNTER — Ambulatory Visit: Admitting: Family Medicine

## 2024-02-11 ENCOUNTER — Ambulatory Visit

## 2024-02-11 ENCOUNTER — Ambulatory Visit: Payer: Self-pay | Admitting: Family Medicine

## 2024-02-11 ENCOUNTER — Ambulatory Visit: Payer: Self-pay

## 2024-02-11 VITALS — BP 108/69 | HR 76 | Resp 20 | Wt 285.0 lb

## 2024-02-11 DIAGNOSIS — I82612 Acute embolism and thrombosis of superficial veins of left upper extremity: Secondary | ICD-10-CM | POA: Diagnosis not present

## 2024-02-11 DIAGNOSIS — M79602 Pain in left arm: Secondary | ICD-10-CM | POA: Diagnosis not present

## 2024-02-11 DIAGNOSIS — I808 Phlebitis and thrombophlebitis of other sites: Secondary | ICD-10-CM | POA: Diagnosis not present

## 2024-02-11 DIAGNOSIS — M7989 Other specified soft tissue disorders: Secondary | ICD-10-CM | POA: Insufficient documentation

## 2024-02-11 NOTE — Assessment & Plan Note (Signed)
 Suspect that she has thrombophlebitis of the cephalic vein however with pain into the upper arm will send for ultrasound to rule out DVT as well.  Discussed she could trial alternating warm and cool compresses.

## 2024-02-11 NOTE — Progress Notes (Signed)
 I would have one of his out of brace boot clinic Tracy [name] - 58 y.o. female MRN [id number]  Date of birth: Dec 28, 1965  Subjective Chief Complaint  Patient presents with   Tingling    Left forearm    HPI Tracy [name] is a 58 year old female here today with complaint of left arm pain.  She had oral surgery last week.  In area where IV was placed she now has swelling and tenderness extending up the forearm.  She does have tingling sensation as well as sensation that she has a blood pressure cuff around her arm.  Denies any weakness of the arm.  ROS:  A comprehensive ROS was completed and negative except as noted per HPI  Allergies  Allergen Reactions   Sulfa Antibiotics Itching, Swelling and Rash   Food     Walnut-mouth swelling/itching   Tilactase Swelling    Walnut-mouth swelling/itching   Bupropion Hcl Itching and Rash   Codeine Itching    Past Medical History:  Diagnosis Date   Acanthosis nigricans, acquired 02/09/2015   Anxiety and depression 10/31/2014   Arthritis    back, knees, right elbow   Arthritis of left hip 06/25/2018   Bilateral swelling of feet and ankles    Bipolar disorder (HCC)    Borderline personality disorder (HCC)    Cancer (HCC)    oral lichen planus, uses mouth wash, cream   Carpal tunnel syndrome 09/06/2015   Chewing difficulty    Chronic left shoulder pain 05/17/2020   Constipation    Coronary artery disease 03/21/2021   COVID 10/08/2021   Cystic teratoma    BENIGN   Dental crowns present    Diabetes mellitus without complication (HCC)    Diabetes mellitus, type 2 (HCC) 06/10/2013   Diarrhea    Diverticulosis of colon without hemorrhage    DOE (dyspnea on exertion) 03/21/2021   Dyspepsia 12/14/2020   Essential hypertension 10/02/2007   Qualifier: Diagnosis of  By: Kassie MD, Sean A    Facial rash 06/16/2019   Female hirsutism 02/09/2015   Food allergy    Walnuts   GAD (generalized anxiety disorder)    Gastritis and  gastroduodenitis    Gastroesophageal reflux disease    Grade II internal hemorrhoids    Greater trochanteric bursitis of left hip 07/24/2018   Hemorrhoids, internal 07/18/2017   History of kidney stones    History of migraine headaches    History of posttraumatic stress disorder (PTSD)    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/2015   Hypothyroidism    HYPOTHYROIDISM, POSTSURGICAL 10/02/2007   Qualifier: Diagnosis of  By: Kassie MD, Sean A    IBS (irritable bowel syndrome)    Infertility associated with anovulation 02/09/2015   Insulin  resistance 02/09/2015   Joint pain    Leg swelling 09/21/2020   Lichen planus    Liver cirrhosis secondary to NASH (nonalcoholic steatohepatitis) (HCC)    Lower back pain 07/18/2017   MDD (major depressive disorder) 06/06/2015   Migraine 10/02/2007   Morbid obesity with BMI of 40.0-44.9, adult (HCC) 06/23/2020   NASH (nonalcoholic steatohepatitis)    Nausea without vomiting    Nonalcoholic fatty liver disease 02/09/2015   Obstructive sleep apnea 06/27/2014   Oral mucosal lesion 06/17/2019   Osteoarthritis    Other fatigue    PCOS (polycystic ovarian syndrome)    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   POLYCYSTIC OVARIAN DISEASE 10/02/2007   Qualifier: Diagnosis of  By: Kassie MD, Alyce A    PONV (postoperative nausea  and vomiting)    also hx. 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   Qualifier: Diagnosis of  By: Kassie MD, Sean A    Trigeminal neuralgia    Unilateral primary osteoarthritis, left hip 02/10/2019   Vitamin D  deficiency      Past Surgical History:  Procedure Laterality Date   ACHILLES TENDON SURGERY Left 2007   APPENDECTOMY     CARPAL TUNNEL RELEASE Right 01/12/2016   Procedure: RIGHT CARPAL TUNNEL RELEASE;  Surgeon: Elsie Mussel, MD;  Location: [facility];  Service: Orthopedics;  Laterality: Right;   COLONOSCOPY WITH PROPOFOL  N/A 04/20/2014   Procedure: COLONOSCOPY WITH PROPOFOL ;  Surgeon: Elsie Cree, MD;  Location: [facility];  Service: Endoscopy;  Laterality: N/A;   COLONOSCOPY WITH PROPOFOL  N/A 01/10/2021   Procedure: COLONOSCOPY WITH PROPOFOL ;  Surgeon: San Sandor GAILS, DO;  Location: [facility];  Service: Gastroenterology;  Laterality: N/A;   ESOPHAGOGASTRODUODENOSCOPY (EGD) WITH PROPOFOL  N/A 04/20/2014   Procedure: ESOPHAGOGASTRODUODENOSCOPY (EGD) WITH PROPOFOL ;  Surgeon: Elsie Cree, MD;  Location: [facility];  Service: Endoscopy;  Laterality: N/A;   ESOPHAGOGASTRODUODENOSCOPY (EGD) WITH PROPOFOL  N/A 01/10/2021   Procedure: ESOPHAGOGASTRODUODENOSCOPY (EGD) WITH PROPOFOL ;  Surgeon: San Sandor GAILS, DO;  Location: [facility];  Service: Gastroenterology;  Laterality: N/A;   EXCISION HAGLUND'S DEFORMITY WITH ACHILLES TENDON REPAIR Right 02/25/2013   Procedure: RIGHT ACHILLES DEBRIDEMENT AND RECONSTRUCTION;  HAGLUND'S EXCISION; GASTROC RECESSION AND FLEXOR HALLUCIS LONGUS TRANSFER;  Surgeon: Norleen Armor, MD;  Location: [facility];  Service: Orthopedics;  Laterality: Right;   IR ANGIOGRAM SELECTIVE EACH ADDITIONAL VESSEL  05/15/2023   IR ANGIOGRAM SELECTIVE EACH ADDITIONAL VESSEL  05/15/2023   IR ANGIOGRAM SELECTIVE EACH ADDITIONAL VESSEL  05/15/2023   IR ANGIOGRAM SELECTIVE EACH ADDITIONAL VESSEL  05/15/2023   IR EMBO ARTERIAL NOT HEMORR HEMANG INC GUIDE ROADMAPPING  05/15/2023   IR RADIOLOGIST EVAL & MGMT  04/16/2023   IR US  GUIDE VASC ACCESS LEFT  05/15/2023   JOINT REPLACEMENT     hip   LUMBAR LAMINECTOMY     X 3   neck fusion     OVARIAN CYST SURGERY  Left 1998   PILONIDAL CYST EXCISION  1994   RIGHT OOPHORECTOMY     SHOULDER ARTHROSCOPY WITH ROTATOR CUFF REPAIR Right 10/17/2022   Procedure: Right shoulder arthroscopy, debridement, subacromial decompression, distal clavicle resection, rotator cuff repair;  Surgeon: Melita Drivers, MD;  Location: [facility];  Service: Orthopedics;  Laterality: Right;    STERIOD INJECTION Left 01/12/2016   Procedure: STEROID INJECTION LEFT WRIST;  Surgeon: Elsie Mussel, MD;  Location: [facility];  Service: Orthopedics;  Laterality: Left;   TOTAL HIP ARTHROPLASTY Left 03/19/2019   Procedure: LEFT TOTAL HIP ARTHROPLASTY ANTERIOR APPROACH;  Surgeon: Vernetta Lonni GRADE, MD;  Location: [facility];  Service: Orthopedics;  Laterality: Left;   TOTAL THYROIDECTOMY  2003   UPPER GI ENDOSCOPY  01/19/2015    Social History   Socioeconomic History   Marital status: Divorced    Spouse name: Not on file  Number of children: 0   Years of education: 16   Highest education level: Bachelor's degree (e.g., BA, AB, BS)  Occupational History   Occupation: RN-Telephone Triage Nurse    Employer: [employer]    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   Drug use: No   Sexual activity: Not Currently    Partners: Male    Birth control/protection: Condom  Other Topics Concern   Not on file  Social History Narrative   RN - [employer]   Regular exercise: no   Caffeine  use: 2 x daily   Right-handed.   Lives alone.    Social Drivers of Health   Financial Resource Strain: Medium Risk (09/03/2023)   Overall Financial Resource Strain (CARDIA)    Difficulty of Paying Living Expenses: Somewhat hard  Food Insecurity: No Food Insecurity (09/03/2023)   Hunger Vital Sign    Worried About Running Out of Food in the Last Year: Never true    Ran Out of Food in the Last Year: Never true  Transportation Needs: No Transportation Needs (09/03/2023)   PRAPARE - Administrator, Civil Service (Medical): No    Lack of Transportation (Non-Medical): No  Physical Activity: Unknown (09/03/2023)   Exercise Vital Sign    Days of Exercise per Week: 0 days    Minutes of Exercise per Session: Not on file  Stress: Stress Concern Present (09/03/2023)   [employer] of Occupational Health - Occupational Stress Questionnaire    Feeling of Stress : Rather much  Social Connections: Moderately Isolated (09/03/2023)   Social Connection and Isolation Panel    Frequency of Communication with Friends and Family: More than three times a week    Frequency of Social Gatherings with Friends and Family: Once a week    Attends Religious Services: More than 4 times per year    Active Member of [employer] or Organizations: No    Attends Engineer, structural: Not on file    Marital Status: Divorced    Family History  Problem Relation Age of Onset   Heart disease Father    Depression Father    Heart attack Father    Sudden death Father    Anxiety disorder Father    Bipolar disorder Father    Obesity Father    Cancer Brother        THROAT cancer   Depression Brother    Anxiety disorder Brother    Depression Sister    Breast cancer Mother    Hypertension Mother    Cancer Mother    Depression Mother    Sleep apnea Mother    Obesity Mother    Depression Maternal Grandmother    Stroke Maternal Grandfather    Colon cancer Brother    Pancreatic cancer Neg Hx    Stomach cancer Neg Hx    Liver disease Neg Hx  Health Maintenance  Topic Date Due   HIV Screening  Never done   Hepatitis C Screening  Never done   Hepatitis B Vaccines (1 of 3 - 19+ 3-dose series) Never done   OPHTHALMOLOGY EXAM  09/19/2022   COVID-19 Vaccine (7 - 2024-25 season) 04/06/2023   HEMOGLOBIN A1C  03/03/2024   INFLUENZA VACCINE  03/05/2024   Diabetic kidney evaluation - Urine ACR  03/27/2024   Diabetic kidney evaluation - eGFR measurement  09/03/2024   FOOT EXAM  09/03/2024   MAMMOGRAM  04/08/2025   DTaP/Tdap/Td (2 - Td or Tdap) 01/03/2028   Cervical Cancer Screening (HPV/Pap Cotest)  03/05/2028   Colonoscopy  01/11/2031   Pneumococcal Vaccine 33-4 Years old  Completed   Zoster Vaccines- Shingrix  Completed   HPV VACCINES  Aged Out   Meningococcal B Vaccine  Aged Out     ----------------------------------------------------------------------------------------------------------------------------------------------------------------------------------------------------------------- Physical Exam BP 108/69   Pulse 76   Resp 20   Wt 285 lb (129.3 kg)   LMP 08/24/2020   SpO2 99%   BMI 39.75 kg/m   Physical Exam Constitutional:      Appearance: Normal appearance.  HENT:     Head: Normocephalic and atraumatic.  Eyes:     General: No scleral icterus. Musculoskeletal:     Cervical back: Neck supple.  Skin:    Comments: Tenderness and swelling along the left cephalic vein.  This does extend into the brachiocephalic area.  Neurological:     Mental Status: She is alert.  Psychiatric:        Mood and Affect: Mood normal.        Behavior: Behavior normal.     ------------------------------------------------------------------------------------------------------------------------------------------------------------------------------------------------------------------- Assessment and Plan  Left arm swelling Suspect that she has thrombophlebitis of the cephalic vein however with pain into the upper arm will  send for ultrasound to rule out DVT as well.  Discussed she could trial alternating warm and cool compresses.   No orders of the defined types were placed in this encounter.   No follow-ups on file.

## 2024-02-11 NOTE — Telephone Encounter (Signed)
 FYI Only or Action Required?: FYI only for provider.  Patient was last seen in primary care on 11/28/2023 by Willo Mini, NP.  Called Nurse Triage reporting Arm Pain.  Symptoms began about a week ago.  Interventions attempted: OTC medications: Tylenol .  Symptoms are: left forearm vein hardened with warmth and pain (recent IV in the arm) unchanged.  Triage Disposition: See Physician Within 24 Hours  Patient/caregiver understands and will follow disposition?: Yes                  Copied from CRM [id number]. Topic: Clinical - Red Word Triage >> Feb 11, 2024  8:24 AM Tracy [name] wrote: Red Word that prompted transfer to Nurse Triage: Patient states she thinks she may have a possible blood clot in left arm. She had surgery last week & has upper arm pain. States that if she feels along the vein, it's hard and she's worried. Reason for Disposition  Localized pain, redness or hard lump along vein  Answer Assessment - Initial Assessment Questions 1. ONSET: When did the pain start?     X 1 week.  2. LOCATION: Where is the pain located?     Left forearm.  3. PAIN: How bad is the pain? (Scale 1-10; or mild, moderate, severe)   - MILD (1-3): Doesn't interfere with normal activities.   - MODERATE (4-7): Interferes with normal activities (e.g., work or school) or awakens from sleep.   - SEVERE (8-10): Excruciating pain, unable to do any normal activities, unable to hold a cup of water .     2/10 at rest, 6/10 with palpation.   4. WORK OR EXERCISE: Has there been any recent work or exercise that involved this part of the body?     No.  5. CAUSE: What do you think is causing the arm pain?     Patient had an IV in her left forearm about 2 inches above the wrist. She states since the IV was pulled there has been redness, and the vein is protruding/hard almost all the way up to the elbow. Patient is concerned for a blood clot.  6. OTHER SYMPTOMS: Do you have any other  symptoms? (e.g., neck pain, swelling, rash, fever, numbness, weakness)     Warmth in left arm. Patient denies chest pain or SOB.  7. PREGNANCY: Is there any chance you are pregnant? When was your last menstrual period?     N/A.  Protocols used: Arm Pain-A-AH

## 2024-02-12 ENCOUNTER — Other Ambulatory Visit: Payer: Self-pay

## 2024-02-12 ENCOUNTER — Other Ambulatory Visit (HOSPITAL_COMMUNITY): Payer: Self-pay

## 2024-02-12 ENCOUNTER — Other Ambulatory Visit: Payer: Self-pay | Admitting: Adult Health

## 2024-02-12 DIAGNOSIS — F410 Panic disorder [episodic paroxysmal anxiety] without agoraphobia: Secondary | ICD-10-CM

## 2024-02-12 DIAGNOSIS — G4733 Obstructive sleep apnea (adult) (pediatric): Secondary | ICD-10-CM

## 2024-02-12 NOTE — Telephone Encounter (Signed)
 Sent MyChart message to schedule FU, was due in May.

## 2024-02-15 ENCOUNTER — Encounter: Payer: Self-pay | Admitting: Family Medicine

## 2024-02-16 NOTE — Telephone Encounter (Signed)
 Please call to schedule FU, did not respond to MyChart message.

## 2024-02-16 NOTE — Telephone Encounter (Signed)
Left vm to sch 

## 2024-02-17 ENCOUNTER — Other Ambulatory Visit (HOSPITAL_COMMUNITY): Payer: Self-pay

## 2024-02-17 ENCOUNTER — Other Ambulatory Visit (HOSPITAL_BASED_OUTPATIENT_CLINIC_OR_DEPARTMENT_OTHER): Payer: Self-pay

## 2024-02-17 MED ORDER — PREDNISONE 20 MG PO TABS
ORAL_TABLET | ORAL | 0 refills | Status: DC
  Filled 2024-02-17 (×2): qty 18, 9d supply, fill #0

## 2024-02-17 MED ORDER — FLUCONAZOLE 100 MG PO TABS
ORAL_TABLET | ORAL | 0 refills | Status: DC
  Filled 2024-02-17 (×2): qty 5, 10d supply, fill #0

## 2024-02-18 ENCOUNTER — Other Ambulatory Visit: Payer: Self-pay

## 2024-02-18 ENCOUNTER — Other Ambulatory Visit (HOSPITAL_BASED_OUTPATIENT_CLINIC_OR_DEPARTMENT_OTHER): Payer: Self-pay

## 2024-02-18 ENCOUNTER — Encounter: Payer: Self-pay | Admitting: Family Medicine

## 2024-02-18 ENCOUNTER — Telehealth: Payer: Self-pay | Admitting: Adult Health

## 2024-02-18 ENCOUNTER — Other Ambulatory Visit (HOSPITAL_COMMUNITY): Payer: Self-pay

## 2024-02-18 DIAGNOSIS — F339 Major depressive disorder, recurrent, unspecified: Secondary | ICD-10-CM

## 2024-02-18 DIAGNOSIS — F3181 Bipolar II disorder: Secondary | ICD-10-CM | POA: Diagnosis not present

## 2024-02-18 DIAGNOSIS — K7460 Unspecified cirrhosis of liver: Secondary | ICD-10-CM

## 2024-02-18 DIAGNOSIS — K7581 Nonalcoholic steatohepatitis (NASH): Secondary | ICD-10-CM

## 2024-02-18 DIAGNOSIS — F411 Generalized anxiety disorder: Secondary | ICD-10-CM

## 2024-02-18 DIAGNOSIS — F331 Major depressive disorder, recurrent, moderate: Secondary | ICD-10-CM

## 2024-02-18 MED ORDER — FLUCONAZOLE 100 MG PO TABS
100.0000 mg | ORAL_TABLET | ORAL | 0 refills | Status: DC
  Filled 2024-03-21: qty 5, 10d supply, fill #0

## 2024-02-18 MED ORDER — ARMODAFINIL 200 MG PO TABS
200.0000 mg | ORAL_TABLET | Freq: Every day | ORAL | 0 refills | Status: DC | PRN
  Filled 2024-02-18 (×2): qty 30, 30d supply, fill #0

## 2024-02-18 MED ORDER — DEXCOM G7 SENSOR MISC
1 refills | Status: AC
  Filled 2024-02-18 – 2024-02-26 (×2): qty 3, 30d supply, fill #0
  Filled 2024-04-13: qty 3, 30d supply, fill #1
  Filled 2024-08-18: qty 3, 30d supply, fill #2

## 2024-02-18 MED ORDER — RISPERIDONE 2 MG PO TABS
2.0000 mg | ORAL_TABLET | Freq: Every day | ORAL | 0 refills | Status: DC
Start: 2024-02-18 — End: 2024-03-26
  Filled 2024-02-18: qty 30, 30d supply, fill #0

## 2024-02-18 MED ORDER — CLONAZEPAM 1 MG PO TABS
1.0000 mg | ORAL_TABLET | Freq: Three times a day (TID) | ORAL | 0 refills | Status: DC
  Filled 2024-02-18: qty 90, 30d supply, fill #0

## 2024-02-18 MED ORDER — PREDNISONE 20 MG PO TABS
ORAL_TABLET | ORAL | 0 refills | Status: DC
  Filled 2024-02-18: qty 18, 9d supply, fill #0

## 2024-02-18 MED ORDER — HYDROXYZINE HCL 25 MG PO TABS
25.0000 mg | ORAL_TABLET | Freq: Three times a day (TID) | ORAL | 0 refills | Status: DC | PRN
  Filled 2024-02-18: qty 90, 30d supply, fill #0

## 2024-02-18 MED ORDER — TRAZODONE HCL 100 MG PO TABS
100.0000 mg | ORAL_TABLET | Freq: Every day | ORAL | 0 refills | Status: AC
Start: 2024-02-18 — End: ?
  Filled 2024-02-18 – 2024-03-05 (×2): qty 30, 30d supply, fill #0

## 2024-02-18 NOTE — Telephone Encounter (Signed)
 Next visit is 03/26/24. Tracy [name] is requesting refills on the following:   Hydroxyzine , Trazodone , Armodafinil , Clonazepam  and Risperidone  called to:   [name]    Phone: [phone]  Fax: [phone]

## 2024-02-18 NOTE — Telephone Encounter (Signed)
 Pended clonazepam  and armodafinil . Hydroxyzine , trazodone , and risperidone  RF sent.

## 2024-02-19 ENCOUNTER — Other Ambulatory Visit (HOSPITAL_BASED_OUTPATIENT_CLINIC_OR_DEPARTMENT_OTHER): Payer: Self-pay

## 2024-02-19 DIAGNOSIS — L438 Other lichen planus: Secondary | ICD-10-CM | POA: Diagnosis not present

## 2024-02-23 DIAGNOSIS — M179 Osteoarthritis of knee, unspecified: Secondary | ICD-10-CM | POA: Insufficient documentation

## 2024-02-24 ENCOUNTER — Other Ambulatory Visit (HOSPITAL_COMMUNITY): Payer: Self-pay

## 2024-02-24 ENCOUNTER — Telehealth: Payer: Self-pay

## 2024-02-24 NOTE — Telephone Encounter (Signed)
 Pharmacy Patient Advocate Encounter   Received notification from Patient Advice Request messages that prior authorization for Dexcom G7 sensors is required/requested.   Insurance verification completed.   The patient is insured through [facility] .   Per test claim: Refill too soon. PA is not needed at this time. Medication was filled 02/19/24. Next eligible fill date is 05/01/24.   The Dexcom G7 sensors was filled for a 90 day supply and the copay is $209.48. No PA is required at this time.

## 2024-02-26 ENCOUNTER — Other Ambulatory Visit: Payer: Self-pay

## 2024-02-26 ENCOUNTER — Other Ambulatory Visit (HOSPITAL_BASED_OUTPATIENT_CLINIC_OR_DEPARTMENT_OTHER): Payer: Self-pay

## 2024-02-26 ENCOUNTER — Other Ambulatory Visit: Payer: Self-pay | Admitting: Family Medicine

## 2024-02-26 ENCOUNTER — Other Ambulatory Visit (HOSPITAL_COMMUNITY): Payer: Self-pay

## 2024-02-26 MED ORDER — FREESTYLE LITE TEST VI STRP
ORAL_STRIP | Freq: Four times a day (QID) | 12 refills | Status: AC
  Filled 2024-02-26: qty 100, 25d supply, fill #0

## 2024-02-27 ENCOUNTER — Encounter: Payer: Self-pay | Admitting: Family Medicine

## 2024-03-03 ENCOUNTER — Ambulatory Visit

## 2024-03-03 VITALS — BP 120/70 | HR 94 | Temp 97.5°F | Ht 71.0 in | Wt 280.0 lb

## 2024-03-03 DIAGNOSIS — G43709 Chronic migraine without aura, not intractable, without status migrainosus: Secondary | ICD-10-CM | POA: Diagnosis not present

## 2024-03-03 DIAGNOSIS — G4733 Obstructive sleep apnea (adult) (pediatric): Secondary | ICD-10-CM | POA: Diagnosis not present

## 2024-03-03 DIAGNOSIS — E1169 Type 2 diabetes mellitus with other specified complication: Secondary | ICD-10-CM

## 2024-03-03 DIAGNOSIS — K219 Gastro-esophageal reflux disease without esophagitis: Secondary | ICD-10-CM

## 2024-03-03 DIAGNOSIS — K7581 Nonalcoholic steatohepatitis (NASH): Secondary | ICD-10-CM | POA: Diagnosis not present

## 2024-03-03 DIAGNOSIS — Z1159 Encounter for screening for other viral diseases: Secondary | ICD-10-CM | POA: Diagnosis not present

## 2024-03-03 DIAGNOSIS — E559 Vitamin D deficiency, unspecified: Secondary | ICD-10-CM

## 2024-03-03 DIAGNOSIS — F332 Major depressive disorder, recurrent severe without psychotic features: Secondary | ICD-10-CM | POA: Diagnosis not present

## 2024-03-03 DIAGNOSIS — I10 Essential (primary) hypertension: Secondary | ICD-10-CM | POA: Diagnosis not present

## 2024-03-03 DIAGNOSIS — E039 Hypothyroidism, unspecified: Secondary | ICD-10-CM

## 2024-03-03 DIAGNOSIS — K7460 Unspecified cirrhosis of liver: Secondary | ICD-10-CM

## 2024-03-03 NOTE — Patient Instructions (Signed)
  VISIT SUMMARY: Today, we discussed your various health conditions and updated your treatment plans. We focused on managing your liver cirrhosis, hypothyroidism, diabetes, sleep apnea, migraines, hypertension, mental health, oral lichen planus, and knee pain.  YOUR PLAN: LIVER CIRRHOSIS DUE TO NONALCOHOLIC STEATOHEPATITIS (NASH): You have liver cirrhosis caused by nonalcoholic steatohepatitis, diagnosed via liver biopsy in 2018 or 2019. -Contact [facility] in [place] to confirm referral and schedule a follow-up appointment. -Ensure regular ultrasounds every six months to monitor for liver cancer.  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 -Continue taking Synthroid  (levothyroxine ) 200 mcg daily.  TYPE 2 DIABETES MELLITUS, WELL CONTROLLED: Your type 2 diabetes is well controlled with Mounjaro  12.5 mg weekly. Your last hemoglobin A1c was 5.1 in January. -Continue taking Mounjaro  12.5 mg weekly. -Monitor your blood sugar levels with the Dexcom continuous glucose monitor. -Recheck your hemoglobin A1c.  OBSTRUCTIVE SLEEP APNEA: You have obstructive sleep apnea managed with a CPAP machine, but you need a new sleep study due to daytime sleepiness and nasal congestion with your current mask. -Schedule a new sleep study WHICH WE WILL ORDER NEXT VISIT.Changing to a full face CPAP mask.  MIGRAINE HEADACHES: You have migraine headaches managed with Qulipta  and Imitrex  as needed. Nurtec was not effective and has been discontinued. -Continue taking Qulipta . -Use Imitrex  100 mg as needed. -Discontinue Nurtec.  HYPERTENSION, CURRENTLY NOT REQUIRING MEDICATION: Your blood pressure is currently well controlled without medication. Lisinopril  was discontinued due to low blood pressure and dizziness, but it was initially prescribed for kidney protection due to diabetes. -Keep  lisinopril  on your medication list. -Consider taking lisinopril  5 mg daily or 2.5 mg every other day for kidney protection.  BIPOLAR II DISORDER, MAJOR DEPRESSIVE DISORDER, PTSD, BORDERLINE PERSONALITY DISORDER, AND GENERALIZED ANXIETY DISORDER: You have a complex mental health history managed by your psychiatric nurse practitioner, Angeline Nelson. Your current medications include Klonopin , hydroxyzine , Lamictal , Risperdal , Zoloft , and trazodone . -Continue your current psychiatric medications. -Maintain follow-up with your psychiatric nurse practitioner, [facility].  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 -Use prednisone  during flares. -Apply Elidel   topically during flares. -ConTINUE to see a dermatologist for infusion therapy.    IRRITABLE BOWEL SYNDROME WITH DIARRHEA: You have irritable bowel syndrome with occasional diarrhea, attributed to Mounjaro  use. -Adjust your diet as needed.                      Contains text generated by [employer]bridge.                                 Contains text generated by Abridge.

## 2024-03-03 NOTE — Progress Notes (Signed)
 Subjective:  Patient ID: Tracy [name], female    DOB: 1966/06/28  Age: 58 y.o. MRN: [id number]  Chief Complaint  Patient presents with   Establish Care    HPI:  Discussed the use of AI scribe software for clinical note transcription with the patient, who gave verbal consent to proceed.  History of Present Illness   Tracy [name] is a 58 year old female who presents for a new patient visit to establish care closer to home. Her previous pcp was in [place].   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 - Currently on Synthroid  200 mcg daily for hypothyroidism  Migraine headaches - Under care of a neurologist - Takes Qulipta  regularly for prevention - Uses Imitrex  100 mg as needed, which is more effective than Nurtec - Ongoing migraine symptoms  Hypertension and orthostatic symptoms - Previously managed with lisinopril  5 mg daily WHICH SHE STATES WAS GIVEN FOR KIDNEY PROTECTION FOR DIABETES - Lisinopril  discontinued by her  several months ago due to low blood pressure and dizziness - No current antihypertensive therapy  Type 2 diabetes mellitus and glycemic control - Diagnosed approximately 15 years ago - Hemoglobin A1c was 5.1% in January - No longer uses insulin  - Currently on Mounjaro  12.5 mg weekly, which has improved glycemic control - Uses Dexcom continuous glucose monitor - Occasional hypoglycemic episodes  Obstructive sleep apnea and cpap intolerance - Uses CPAP machine - Last sleep study over 15 years ago - Daytime sleepiness persists - Nasal congestion with current CPAP mask  Neuropsychiatric symptoms - History of major depression, PTSD, borderline personality disorder, generalized anxiety disorder, and bipolar type 2 - Medications include: Klonopin  1 mg up to three times daily, hydroxyzine  at night, Lamictal  400 mg at bedtime, Risperdal  20 mg at bedtime, Zoloft  200 mg every morning, trazodone  100 mg at  bedtime  Chronic liver disease - Diagnosed with cirrhosis secondary to nonalcoholic steatohepatitis after liver biopsy in 2018 or 2019 - No abdominal ultrasound in several years despite recommendation for imaging every six months  Oral mucosal lesions - Diagnosed with oral lichen planus in 2020 or 2021 - Experiences flare-ups requiring prednisone  and topical treatments (Elidel , Prevymis)  Gastrointestinal symptoms - Irritable bowel syndrome with occasional diarrhea, attributed to Mounjaro  use - No abdominal pain  Vitamin d  deficiency - Managed with vitamin D  1000 IU daily  Venous thromboembolism - History of superficial venous thrombosis in left upper extremity following recent mouth surgery  Musculoskeletal symptoms and falls - History of falls due to left knee pain and occasional vertigo - Sustained a rotator cuff injury requiring surgery - Lives alone but has family nearby in [place]         03/03/2024    3:06 PM 07/30/2022   11:29 AM 09/04/2021    1:08 PM 03/14/2021    2:57 PM 02/14/2021    3:31 PM  Depression screen PHQ 2/9  Decreased Interest 1 1  3 3   Down, Depressed, Hopeless 0 2  3 3   PHQ - 2 Score 1 3  6 6   Altered sleeping 3 0  2 3  Tired, decreased energy 2 2  3 3   Change in appetite 1 2  2 3   Feeling bad or failure about yourself  0 3  3 3   Trouble concentrating 1 1  2 3   Moving slowly or fidgety/restless 0 0  1 0  Suicidal thoughts 0 0  0 0  PHQ-9 Score 8 11  19 21  Difficult doing work/chores Somewhat difficult Somewhat difficult  Extremely dIfficult Very difficult     Information is confidential and restricted. Go to Review Flowsheets to unlock data.        03/03/2024    3:06 PM  Fall Risk   Falls in the past year? 1  Number falls in past yr: 1  Injury with Fall? 1  Risk for fall due to : History of fall(s)  Follow up Falls evaluation completed    Patient Care Team: Cloie Wooden, MD as PCP - General (Family Medicine) Norval, Toribio CROME, MD  (Inactive) (Obstetrics and Gynecology) Vincente Grip, MD as Attending Physician (Psychiatry) Darden Planas, NP as Nurse Practitioner Claudene Arthea HERO, DO as Consulting Physician (Family Medicine)   Review of Systems  Constitutional:  Positive for fatigue. Negative for chills and fever.  HENT:  Negative for congestion, ear pain, sinus pressure and sore throat.   Respiratory:  Negative for cough and shortness of breath.   Cardiovascular:  Negative for chest pain.  Gastrointestinal:  Positive for diarrhea. Negative for abdominal pain, constipation, nausea and vomiting.  Genitourinary:  Negative for dysuria and frequency.  Musculoskeletal:  Positive for arthralgias and myalgias. Negative for back pain.  Neurological:  Positive for headaches. Negative for dizziness.  Psychiatric/Behavioral:  Positive for sleep disturbance. Negative for dysphoric mood. The patient is not nervous/anxious.     Current Outpatient Medications on File Prior to Visit  Medication Sig Dispense Refill   AMBULATORY NON FORMULARY MEDICATION Rollator for use daily. Elevated toilet seat with handlebars 1 each 0   Armodafinil  200 MG TABS Take 1 tablet (200 mg total) by mouth daily as needed (sleepiness). 30 tablet 0   Atogepant  (QULIPTA ) 60 MG TABS Take 1 tablet (60 mg total) by mouth daily. 30 tablet 11   Blood Glucose Monitoring Suppl (FREESTYLE LITE) w/Device KIT Use as directed. 1 kit 0   Blood Glucose Monitoring Suppl (FREESTYLE LITE) w/Device KIT Check fasting blood sugar every morning and before meals up to 4 times daily. 1 kit PRN   botulinum toxin Type A  (BOTOX ) 200 units injection Inject 200 Units into the muscle every 3 (three) months. 1 each 2   clobetasol  (TEMOVATE ) 0.05 % GEL Apply small amount to affected area twice daily using finger. Gently pat dry with cotton gauze.  Do not eat,drink, or speak for 30 minutes to allow absorption. 30 g 2   clonazePAM  (KLONOPIN ) 1 MG tablet Take 1 tablet (1 mg total) by mouth 3  (three) times daily. 90 tablet 0   Continuous Glucose Sensor (DEXCOM G7 SENSOR) MISC Use to check glucose throughout the day.  Change every 10 days 9 each 1   Continuous Glucose Transmitter (DEXCOM G6 TRANSMITTER) MISC Use to check continuous blood sugar.  Replace every 90 days 1 each 3   cyclobenzaprine  (FLEXERIL ) 10 MG tablet Take 1 tablet (10 mg total) by mouth 3 (three) times daily as needed for muscle spasms. 30 tablet 1   diclofenac  Sodium (VOLTAREN ) 1 % GEL Apply 1 Application topically 4 (four) times daily as needed (pain).     fluconazole  (DIFLUCAN ) 100 MG tablet Take 1 tablet (100mg  total) by mouth every other day for 10 days 5 tablet 0   fluconazole  (DIFLUCAN ) 100 MG tablet Take 1 tablet (100 mg total) by mouth every other day. 5 tablet 0   folic acid  (FOLVITE ) 1 MG tablet Take 1 tablet (1 mg total) by mouth daily. 90 tablet 1   furosemide  (  LASIX ) 40 MG tablet Take 1 tablet (40 mg total) by mouth daily. 90 tablet 0   glucose blood (FREESTYLE LITE) test strip Use as instructed 100 each 12   glucose blood (FREESTYLE LITE) test strip Check fasting blood sugar every morning and before meals up to 4 times a day as directed. 100 strip 12   hydrOXYzine  (ATARAX ) 25 MG tablet Take 1 tablet (25 mg total) by mouth 3 (three) times daily as needed for anxiety. 90 tablet 0   Insulin  Pen Needle (UNIFINE PENTIPS) 31G X 5 MM MISC Use as directed to inject insulin  100 each 3   ketorolac  (TORADOL ) 10 MG tablet Take 1 tablet (10 mg total) by mouth every 8 (eight) hours as needed. 20 tablet 0   lamoTRIgine  (LAMICTAL ) 200 MG tablet Take 2 tablets (400 mg total) by mouth at bedtime. 180 tablet 1   Lancets 30G MISC Check fasting blood sugar every morning and before meals up to 4 times daily. 100 each 12   lidocaine  (LIDODERM ) 5 % Place 1 patch onto the skin every 12 (twelve) hours. Remove & Discard patch within 12 hours or as directed by MD 30 patch 0   lidocaine  (XYLOCAINE ) 2 % solution Swish and spit 5 mLs in  the mouth or throat 4 (four) times daily as needed for pain for up to 14 days 100 mL 0   lidocaine  (XYLOCAINE ) 5 % ointment Apply topically to affected areas 3 times daily as needed. 35.44 g 0   lisinopril  (ZESTRIL ) 5 MG tablet Take 1 tablet (5 mg total) by mouth daily. 90 tablet 2   meloxicam  (MOBIC ) 15 MG tablet Take 1 tablet by mouth once every morning with a meal for 2 weeks, then daily as needed for pain. 30 tablet 0   Menthol , Topical Analgesic, (BENGAY EX) Apply 1 Application topically daily as needed (pain).     neomycin-bacitracin -polymyxin [phone] OINT Apply topically 2 (two) times daily for 10 days To corners of mouth (Patient taking differently: Apply 1 Application topically 2 (two) times daily as needed (cracking/ raw skin around mouth).) 14 g 2   nystatin  (NYAMYC ) powder Apply 3 times daily as directed. 60 g 3   nystatin  ointment (MYCOSTATIN ) Apply to the skin 2 times daily to corners of mouth (Patient taking differently: Apply 1 Application topically 2 (two) times daily as needed (raw/cracking skin around the mouth).) 15 g 2   ondansetron  (ZOFRAN -ODT) 4 MG disintegrating tablet Dissolve 1 tablet (4 mg total) by mouth 2 (two) times daily as needed for nausea or vomiting. 20 tablet 1   pantoprazole  (PROTONIX ) 40 MG tablet Take 2 tablets (80 mg total) by mouth 2 (two) times daily. 60 tablet 3   pimecrolimus  (ELIDEL ) 1 % cream Apply a small amount to affected area 2 x daily. Gently pat area dry w/cotton gauze. No eating, drinking or speaking for 20 - 30 minutes to allow medicine to be absorbed. Can apply to 2x2 gauze & placed against the affected area. (Patient taking differently: Apply 1 Application topically 2 (two) times daily as needed (ulcers).) 60 g 3   predniSONE  (DELTASONE ) 20 MG tablet Take 3 tablets (60 mg total) by mouth once daily for 3 days, THEN 2 tablets (40 mg total) once daily for 3 days, THEN 1 tablet (20 mg total) once daily for 3 days. Take daily dose all in the morning  with breakfast. 18 tablet 0   pregabalin  (LYRICA ) 75 MG capsule Take 1 capsule (75 mg total) by mouth  3 (three) times daily. 90 capsule 3   risperiDONE  (RISPERDAL ) 2 MG tablet Take 1 tablet (2 mg total) by mouth at bedtime. 30 tablet 0   rosuvastatin  (CRESTOR ) 20 MG tablet Take 1 tablet (20 mg total) by mouth daily. 90 tablet 3   sertraline  (ZOLOFT ) 100 MG tablet Take 2 tablets (200 mg total) by mouth every morning. 180 tablet 3   spironolactone  (ALDACTONE ) 50 MG tablet Take 1 tablet (50 mg total) by mouth 2 (two) times daily. 180 tablet 2   SUMAtriptan  (IMITREX ) 100 MG tablet Take 1 tablet (100 mg total) by mouth every 2 (two) hours as needed for migraine. May repeat in 2 hours if headache persists or recurs. 10 tablet 11   SYNTHROID  200 MCG tablet Take 1 tablet (200 mcg total) by mouth daily before breakfast. 90 tablet 1   tirzepatide  (MOUNJARO ) 12.5 MG/0.5ML Pen Inject 12.5 mg into the skin once a week. 6 mL 0   tiZANidine  (ZANAFLEX ) 4 MG tablet Take 1 tablet (4 mg total) by mouth every 6 (six) hours as needed. 30 tablet 6   traMADol  (ULTRAM ) 50 MG tablet Take 2 tablets (100 mg total) by mouth every 8 (eight) hours as needed for moderate pain. 180 tablet 0   traZODone  (DESYREL ) 100 MG tablet Take 1 tablet (100 mg total) by mouth at bedtime. 30 tablet 0   Current Facility-Administered Medications on File Prior to Visit  Medication Dose Route Frequency Provider Last Rate Last Admin   botulinum toxin Type A  (BOTOX ) injection 155 Units  155 Units Intramuscular Once Onita Duos, MD       Past Medical History:  Diagnosis Date   Acanthosis nigricans, acquired 02/09/2015   Allergy ?   Anemia    Anxiety and depression 10/31/2014   Arthritis    back, knees, right elbow   Arthritis of left hip 06/25/2018   Bilateral swelling of feet and ankles    Bipolar disorder (HCC)    Borderline personality disorder (HCC)    Cancer (HCC)    oral lichen planus, uses mouth wash, cream   Carpal tunnel syndrome  09/06/2015   Chewing difficulty    Chronic left shoulder pain 05/17/2020   Constipation    Coronary artery disease 03/21/2021   COVID 10/08/2021   Cystic teratoma    BENIGN   Dental crowns present    Diabetes mellitus without complication (HCC)    Diabetes mellitus, type 2 (HCC) 06/10/2013   Diarrhea    Diverticulosis of colon without hemorrhage    DOE (dyspnea on exertion) 03/21/2021   Dyspepsia 12/14/2020   Essential hypertension 10/02/2007   Qualifier: Diagnosis of  By: Kassie MD, Alyce A    Facial rash 06/16/2019   Female hirsutism 02/09/2015   Food allergy    Walnuts   GAD (generalized anxiety disorder)    Gastritis and gastroduodenitis    Gastroesophageal reflux disease    Grade II internal hemorrhoids    Greater trochanteric bursitis of left hip 07/24/2018   Hemorrhoids, internal 07/18/2017   History of kidney stones    History of migraine headaches    History of posttraumatic stress disorder (PTSD)    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/2015   Hyperlipidemia    Hypothyroidism    HYPOTHYROIDISM, POSTSURGICAL 10/02/2007   Qualifier: Diagnosis of  By: Kassie MD, Sean A    IBS (irritable bowel syndrome)    Infertility associated with anovulation 02/09/2015   Insulin  resistance 02/09/2015   Joint pain    Leg swelling 09/21/2020  Lichen planus    Liver cirrhosis secondary to NASH (nonalcoholic steatohepatitis) (HCC)    Lower back pain 07/18/2017   MDD (major depressive disorder) 06/06/2015   Migraine 10/02/2007   Morbid obesity with BMI of 40.0-44.9, adult (HCC) 06/23/2020   NASH (nonalcoholic steatohepatitis)    Nausea without vomiting    Nonalcoholic fatty liver disease 02/09/2015   Obstructive sleep apnea 06/27/2014   Oral mucosal lesion 06/17/2019   Osteoarthritis    Other fatigue    PCOS (polycystic ovarian syndrome)    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   Qualifier: Diagnosis of  By: Kassie MD, Sean A    Trigeminal neuralgia    Unilateral primary osteoarthritis, left hip 02/10/2019   Vitamin D  deficiency    Past Surgical History:  Procedure Laterality Date   ACHILLES TENDON SURGERY Left 2007   APPENDECTOMY     CARPAL TUNNEL RELEASE Right 01/12/2016   Procedure: RIGHT CARPAL TUNNEL RELEASE;  Surgeon: Elsie Mussel, MD;  Location: [facility];  Service: Orthopedics;  Laterality: Right;   COLONOSCOPY WITH PROPOFOL  N/A 04/20/2014   Procedure: COLONOSCOPY WITH PROPOFOL ;  Surgeon: Elsie Cree, MD;  Location: [facility];  Service: Endoscopy;  Laterality: N/A;   COLONOSCOPY WITH PROPOFOL  N/A 01/10/2021   Procedure: COLONOSCOPY WITH PROPOFOL ;  Surgeon: San Sandor GAILS, DO;  Location: [facility];  Service: Gastroenterology;  Laterality: N/A;   ESOPHAGOGASTRODUODENOSCOPY (EGD) WITH PROPOFOL  N/A 04/20/2014   Procedure: ESOPHAGOGASTRODUODENOSCOPY (EGD) WITH PROPOFOL ;  Surgeon: Elsie Cree, MD;  Location: [facility];  Service: Endoscopy;  Laterality: N/A;   ESOPHAGOGASTRODUODENOSCOPY (EGD) WITH PROPOFOL  N/A 01/10/2021   Procedure: ESOPHAGOGASTRODUODENOSCOPY (EGD) WITH PROPOFOL ;  Surgeon: San Sandor GAILS, DO;  Location: [facility];  Service: Gastroenterology;  Laterality: N/A;   EXCISION HAGLUND'S DEFORMITY WITH ACHILLES TENDON REPAIR Right 02/25/2013   Procedure: RIGHT ACHILLES  DEBRIDEMENT AND RECONSTRUCTION;  HAGLUND'S EXCISION; GASTROC RECESSION AND FLEXOR HALLUCIS LONGUS TRANSFER;  Surgeon: Norleen Armor, MD;  Location: [facility];  Service: Orthopedics;  Laterality: Right;   IR ANGIOGRAM SELECTIVE EACH ADDITIONAL VESSEL  05/15/2023   IR ANGIOGRAM SELECTIVE EACH ADDITIONAL VESSEL  05/15/2023   IR ANGIOGRAM SELECTIVE EACH ADDITIONAL VESSEL  05/15/2023   IR ANGIOGRAM SELECTIVE EACH ADDITIONAL VESSEL  05/15/2023   IR EMBO ARTERIAL NOT HEMORR HEMANG INC GUIDE ROADMAPPING  05/15/2023   IR RADIOLOGIST EVAL & MGMT  04/16/2023   IR US  GUIDE VASC ACCESS LEFT  05/15/2023   JOINT REPLACEMENT     hip   LUMBAR LAMINECTOMY     X 3   neck fusion     OVARIAN CYST SURGERY Left 1998   PILONIDAL CYST EXCISION  1994   RIGHT OOPHORECTOMY     SHOULDER ARTHROSCOPY WITH ROTATOR CUFF REPAIR Right 10/17/2022   Procedure: Right  shoulder arthroscopy, debridement, subacromial decompression, distal clavicle resection, rotator cuff repair;  Surgeon: Melita Drivers, MD;  Location: [facility];  Service: Orthopedics;  Laterality: Right;    SPINE SURGERY     STERIOD INJECTION Left 01/12/2016   Procedure: STEROID INJECTION LEFT WRIST;  Surgeon: Elsie Mussel, MD;  Location: [facility];  Service: Orthopedics;  Laterality: Left;   TOTAL HIP ARTHROPLASTY Left 03/19/2019   Procedure: LEFT TOTAL HIP ARTHROPLASTY ANTERIOR APPROACH;  Surgeon: Vernetta Lonni GRADE, MD;  Location: [facility];  Service: Orthopedics;  Laterality: Left;   TOTAL THYROIDECTOMY  2003   UPPER GI ENDOSCOPY  01/19/2015    Family History  Problem Relation Age of Onset   Heart disease Father    Depression Father    Heart attack Father    Sudden death Father    Anxiety disorder Father    Bipolar disorder Father    Obesity Father    Cancer Brother        THROAT cancer   Depression Brother    Anxiety disorder Brother    Depression Sister    Breast cancer Mother    Hypertension Mother     Cancer Mother    Depression Mother    Sleep apnea Mother    Obesity Mother    Varicose Veins Mother    Depression Maternal Grandmother    Stroke Maternal Grandfather    Colon cancer Brother    Stroke Paternal Grandmother    Anxiety disorder Sister    Depression Sister    Hearing loss Sister    Diabetes Brother    Hearing loss Brother    Obesity Brother    Pancreatic cancer Neg Hx    Stomach cancer Neg Hx    Liver disease Neg Hx    Social History   Socioeconomic History   Marital status: Divorced    Spouse name: Not on file   Number of children: 0   Years of education: 16   Highest education level: Bachelor's degree (e.g., BA, AB, BS)  Occupational History   Occupation: RN-Telephone Triage Nurse    Employer: [employer]    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   Drug use: No   Sexual activity: Not Currently    Partners: Male    Birth control/protection: Post-menopausal    Comment: Divorced  Other Topics Concern   Not on file  Social History Narrative   RN - [place]   Regular exercise: no   Caffeine  use: 2 x daily   Right-handed.   Lives alone.   Social Drivers of Corporate investment banker Strain: Low Risk  (03/01/2024)   Overall Financial Resource Strain (CARDIA)    Difficulty of Paying Living Expenses: Not hard at all  Food Insecurity: No Food Insecurity (03/01/2024)   Hunger Vital Sign    Worried About Running Out of Food in the Last Year: Never true    Ran Out of Food in  the Last Year: Never true  Transportation  Needs: No Transportation Needs (03/01/2024)   PRAPARE - Administrator, Civil Service (Medical): No    Lack of Transportation (Non-Medical): No  Physical Activity: Insufficiently Active (03/01/2024)   Exercise Vital Sign    Days of Exercise per Week: 1 day    Minutes of Exercise per Session: 10 min  Stress: Stress Concern Present (03/01/2024)   [employer] of Occupational Health - Occupational Stress Questionnaire    Feeling of Stress: Rather much  Social Connections: Moderately Isolated (03/01/2024)   Social Connection and Isolation Panel    Frequency of Communication with Friends and Family: More than three times a week    Frequency of Social Gatherings with Friends and Family: Once a week    Attends Religious Services: More than 4 times per year    Active Member of [employer] or Organizations: No    Attends Engineer, structural: Not on file    Marital Status: Divorced    Objective:  BP 120/70   Pulse 94   Temp (!) 97.5 F (36.4 C)   Ht 5' 11 (1.803 m)   Wt 280 lb (127 kg)   LMP 08/24/2020   SpO2 98%   BMI 39.05 kg/m      03/03/2024    3:02 PM 02/11/2024    3:06 PM 12/25/2023    3:25 PM  BP/Weight  Systolic BP 120 108 127  Diastolic BP 70 69 79  Wt. (Lbs) 280 285 287  BMI 39.05 kg/m2 39.75 kg/m2 40.03 kg/m2    Physical Exam Vitals reviewed.  Constitutional:      Appearance: She is obese.  HENT:     Head: Normocephalic and atraumatic.  Eyes:     Pupils: Pupils are equal, round, and reactive to light.  Cardiovascular:     Rate and Rhythm: Normal rate and regular rhythm.  Pulmonary:     Effort: Pulmonary effort is normal.     Breath sounds: Normal breath sounds.  Musculoskeletal:        General: Normal range of motion.     Cervical back: Normal range of motion.  Skin:    General: Skin is warm.  Neurological:     General: No focal deficit present.     Mental Status: She is alert and oriented to person, place, and time.  Psychiatric:         Mood and Affect: Mood normal.         Lab Results  Component Value Date   WBC 4.7 09/04/2023   HGB 13.1 09/04/2023   HCT 39.8 09/04/2023   PLT 198 09/04/2023   GLUCOSE 80 09/04/2023   CHOL 161 03/28/2023   TRIG 227 (H) 03/28/2023   HDL 44 03/28/2023   LDLCALC 79 03/28/2023   ALT 14 09/04/2023   AST 20 09/04/2023   NA 141 09/04/2023   K 4.2 09/04/2023   CL 101 09/04/2023   CREATININE 1.05 (H) 09/04/2023   BUN 11 09/04/2023   CO2 26 09/04/2023   TSH 1.340 12/25/2023   INR 1.0 05/15/2023   HGBA1C 5.1 09/04/2023   MICROALBUR 0.6 01/31/2017      Assessment & Plan:  Essential hypertension Assessment & Plan: Hypertension currently not requiring medication. Blood pressure today was 120/70 without lisinopril  for several months. Lisinopril  was initially prescribed for kidney protection due to diabetes. Discussed taking lisinopril  intermittently for kidney protection. - Keep lisinopril  on medication list. - Consider taking lisinopril  5 mg  daily or 2.5 mg every other day for kidney protection.  Orders: -     CBC with Differential/Platelet  Gastroesophageal reflux disease without esophagitis -     Comprehensive metabolic panel with GFR -     Lipid panel  Type 2 diabetes mellitus with other specified complication, without long-term current use of insulin  (HCC) Assessment & Plan: DIABETES TYPE 2 CONTROLLED WITHOUT COMPLICATIONS WITHOUT LONG TERM USE OF INSULIN   Type 2 diabetes mellitus diagnosed approximately 15 years ago. Well controlled with Mounjaro  12.5 mg weekly. Hemoglobin A1c last checked in January was 5.1. Occasionally experiences hypoglycemia with blood sugar levels dropping to the 40s. - Continue Mounjaro  12.5 mg weekly. - Monitor blood sugar levels with Dexcom continuous glucose monitor. - Recheck hemoglobin A1c.  Orders: -     Hemoglobin A1c  Hypothyroidism, unspecified type Assessment & Plan: Hypothyroidism after total thyroidectomy for papillary thyroid   carcinoma Hypothyroidism secondary to total thyroidectomy in 2003 for papillary thyroid  carcinoma. - Continue Synthroid  (levothyroxine ) 200 mcg daily.  Orders: -     TSH  Chronic migraine w/o aura w/o status migrainosus, not intractable Assessment & Plan: Migraine headaches managed with Qulipta  and Imitrex  as needed. Nurtec was not effective and has been discontinued. - Continue Qulipta . - Use Imitrex  100 mg as needed. - Discontinue Nurtec as it did not help her   Obstructive sleep apnea Assessment & Plan: Obstructive sleep apnea Obstructive sleep apnea managed with CPAP. Requires new sleep study due to daytime sleepiness and need for a different type of mask. - needs a new sleep study. Will order it at next visit.  - Consider changing CPAP mask from nasal pillows to a full face mask.   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 - Contact [facility] in [place] to confirm referral and schedule follow-up appointment. - Ensure regular ultrasounds every six months to monitor for liver cancer.   Severe episode of recurrent major depressive disorder, without psychotic features (HCC) Assessment & Plan: Bipolar II disorder, major depressive disorder, PTSD, borderline personality disorder, and generalized anxiety disorder Complex mental health history including bipolar II disorder, major depressive disorder, PTSD, borderline personality disorder, and generalized anxiety disorder. Managed by psychiatric nurse practitioner [facility] Current medications include Klonopin , hydroxyzine , Lamictal ,  Risperdal , Zoloft , and trazodone . - Continue current psychiatric medications. 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 Managed with prednisone  during flares and topical treatments including Elidel  and Prevymis. Considering referral to dermatologist for infusion therapy. - Use prednisone  during flares. - Apply Elidel  and Prevymis topically during flares. - Consider referral to dermatologist for infusion therapy.  Superficial venous thrombosis, left upper extremity Superficial venous thrombosis in left upper extremity following recent mouth surgery.  Chronic left knee pain Chronic left knee  pain with history of falls. Scheduled for surgery later in the year. - Schedule knee surgery.  Irritable bowel syndrome with diarrhea Irritable bowel syndrome with diarrhea. - Adjust diet as needed.        No orders of the defined types were placed in this encounter.  Orders Placed This Encounter  Procedures   Comprehensive metabolic panel with GFR   CBC with Differential   Lipid Panel   Vitamin D , 25-hydroxy   TSH   Hemoglobin A1c   Hepatitis C antibody     Follow-up: No follow-ups on file.    An After Visit Summary was printed and given to the patient.  Javi Bollman, MD [facility] [phone]

## 2024-03-03 NOTE — Assessment & Plan Note (Signed)
 Hypertension currently not requiring medication. Blood pressure today was 120/70 without lisinopril  for several months. Lisinopril  was initially prescribed for kidney protection due to diabetes. Discussed taking lisinopril  intermittently for kidney protection. - Keep lisinopril  on medication list. - Consider taking lisinopril  5 mg daily or 2.5 mg every other day for kidney protection.

## 2024-03-03 NOTE — Assessment & Plan Note (Signed)
  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 - Contact [facility] in [place] to confirm referral and schedule follow-up appointment. - Ensure regular ultrasounds every six months to monitor for liver cancer.

## 2024-03-03 NOTE — Assessment & Plan Note (Signed)
 Obstructive sleep apnea Obstructive sleep apnea managed with CPAP. Requires new sleep study due to daytime sleepiness and need for a different type of mask. - needs a new sleep study. Will order it at next visit.  - Consider changing CPAP mask from nasal pillows to a full face mask.

## 2024-03-03 NOTE — Assessment & Plan Note (Signed)
 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 - Continue Synthroid  (levothyroxine ) 200 mcg daily.

## 2024-03-03 NOTE — Assessment & Plan Note (Signed)
 DIABETES TYPE 2 CONTROLLED WITHOUT COMPLICATIONS WITHOUT LONG TERM USE OF INSULIN   Type 2 diabetes mellitus diagnosed approximately 15 years ago. Well controlled with Mounjaro  12.5 mg weekly. Hemoglobin A1c last checked in January was 5.1. Occasionally experiences hypoglycemia with blood sugar levels dropping to the 40s. - Continue Mounjaro  12.5 mg weekly. - Monitor blood sugar levels with Dexcom continuous glucose monitor. - Recheck hemoglobin A1c.

## 2024-03-03 NOTE — Assessment & Plan Note (Signed)
 Migraine headaches managed with Qulipta  and Imitrex  as needed. Nurtec was not effective and has been discontinued. - Continue Qulipta . - Use Imitrex  100 mg as needed. - Discontinue Nurtec as it did not help her

## 2024-03-03 NOTE — Assessment & Plan Note (Signed)
 Bipolar II disorder, major depressive disorder, PTSD, borderline personality disorder, and generalized anxiety disorder Complex mental health history including bipolar II disorder, major depressive disorder, PTSD, borderline personality disorder, and generalized anxiety disorder. Managed by psychiatric nurse practitioner [facility]. Current medications include Klonopin , hydroxyzine , Lamictal , Risperdal , Zoloft , and trazodone . - Continue current psychiatric medications. - Maintain follow-up with psychiatric nurse practitioner

## 2024-03-04 ENCOUNTER — Ambulatory Visit: Payer: Self-pay

## 2024-03-04 LAB — COMPREHENSIVE METABOLIC PANEL WITH GFR
ALT: 18 IU/L (ref 0–32)
AST: 18 IU/L (ref 0–40)
Albumin: 4.4 g/dL (ref 3.8–4.9)
Alkaline Phosphatase: 147 IU/L — ABNORMAL HIGH (ref 44–121)
BUN/Creatinine Ratio: 12 (ref 9–23)
BUN: 13 mg/dL (ref 6–24)
Bilirubin Total: 0.4 mg/dL (ref 0.0–1.2)
CO2: 20 mmol/L (ref 20–29)
Calcium: 9.8 mg/dL (ref 8.7–10.2)
Chloride: 98 mmol/L (ref 96–106)
Creatinine, Ser: 1.07 mg/dL — ABNORMAL HIGH (ref 0.57–1.00)
Globulin, Total: 2.8 g/dL (ref 1.5–4.5)
Glucose: 92 mg/dL (ref 70–99)
Potassium: 4.2 mmol/L (ref 3.5–5.2)
Sodium: 136 mmol/L (ref 134–144)
Total Protein: 7.2 g/dL (ref 6.0–8.5)
eGFR: 61 mL/min/1.73 (ref >59)

## 2024-03-04 LAB — HEMOGLOBIN A1C
Est. average glucose Bld gHb Est-mCnc: 111 mg/dL
Hgb A1c MFr Bld: 5.5 % (ref 4.8–5.6)

## 2024-03-04 LAB — LIPID PANEL
Chol/HDL Ratio: 6.4 ratio — ABNORMAL HIGH (ref 0.0–4.4)
Cholesterol, Total: 290 mg/dL — ABNORMAL HIGH (ref 100–199)
HDL: 45 mg/dL (ref >39)
LDL Chol Calc (NIH): 202 mg/dL — ABNORMAL HIGH (ref 0–99)
Triglycerides: 221 mg/dL — ABNORMAL HIGH (ref 0–149)
VLDL Cholesterol Cal: 43 mg/dL — ABNORMAL HIGH (ref 5–40)

## 2024-03-04 LAB — CBC WITH DIFFERENTIAL/PLATELET
Basophils Absolute: 0 x10E3/uL (ref 0.0–0.2)
Basos: 0 %
EOS (ABSOLUTE): 0.1 x10E3/uL (ref 0.0–0.4)
Eos: 1 %
Hematocrit: 50 % — ABNORMAL HIGH (ref 34.0–46.6)
Hemoglobin: 15.9 g/dL (ref 11.1–15.9)
Immature Grans (Abs): 0 x10E3/uL (ref 0.0–0.1)
Immature Granulocytes: 0 %
Lymphocytes Absolute: 2.2 x10E3/uL (ref 0.7–3.1)
Lymphs: 28 %
MCH: 29.6 pg (ref 26.6–33.0)
MCHC: 31.8 g/dL (ref 31.5–35.7)
MCV: 93 fL (ref 79–97)
Monocytes Absolute: 0.5 x10E3/uL (ref 0.1–0.9)
Monocytes: 6 %
Neutrophils Absolute: 5.1 x10E3/uL (ref 1.4–7.0)
Neutrophils: 65 %
Platelets: 190 x10E3/uL (ref 150–450)
RBC: 5.37 x10E6/uL — ABNORMAL HIGH (ref 3.77–5.28)
RDW: 14.5 % (ref 11.7–15.4)
WBC: 7.9 x10E3/uL (ref 3.4–10.8)

## 2024-03-04 LAB — VITAMIN D 25 HYDROXY (VIT D DEFICIENCY, FRACTURES): Vit D, 25-Hydroxy: 28.8 ng/mL — ABNORMAL LOW (ref 30.0–100.0)

## 2024-03-04 LAB — HEPATITIS C ANTIBODY: Hep C Virus Ab: NONREACTIVE

## 2024-03-04 LAB — TSH: TSH: 6.62 u[IU]/mL — ABNORMAL HIGH (ref 0.450–4.500)

## 2024-03-05 ENCOUNTER — Other Ambulatory Visit (HOSPITAL_BASED_OUTPATIENT_CLINIC_OR_DEPARTMENT_OTHER): Payer: Self-pay

## 2024-03-08 ENCOUNTER — Ambulatory Visit: Payer: Self-pay | Admitting: *Deleted

## 2024-03-08 NOTE — Telephone Encounter (Signed)
 FYI Only or Action Required?: FYI only for provider.  Patient was last seen in primary care on 03/03/2024 by Sirivol, Mamatha, MD.  Called Nurse Triage reporting Blurred Vision.  Symptoms began today.  Interventions attempted: Rest, hydration, or home remedies.  Symptoms are: unchanged.  Triage Disposition: Go to ED Now (or PCP Triage)  Patient/caregiver understands and will follow disposition?: Unsure               Copied from CRM #[id number]. Topic: Clinical - Red Word Triage >> Mar 08, 2024  3:42 PM Montie POUR wrote: Red Word that prompted transfer to Nurse Triage:  Her vision is getting worse; she has headaches; she has diabetes. Reason for Disposition  Patient sounds very sick or weak to the triager  Answer Assessment - Initial Assessment Questions Due to constant sx  and continued blurred vision, recommended ED. Patient reports she is going to eye dr at 4p. Recommended if sx worsen call 911. Please advise if ok to schedule appt for tomorrow. CAL notified      1. DESCRIPTION: How has your vision changed? (e.g., complete vision loss, blurred vision, double vision, floaters, etc.)     Blurred vision  gradual onset since this am 2. LOCATION: One or both eyes? If one, ask: Which eye?     Both eyes  3. SEVERITY: Can you see anything? If Yes, ask: What can you see? (e.g., fine print)     Can see far away,  blurred vision only fine print 4. ONSET: When did this begin? Did it start suddenly or has this been gradual?     Gradually happening  5. PATTERN: Does this come and go, or has it been constant since it started?     Constant  6. PAIN: Is there any pain in your eye(s)?  (Scale 1-10; or mild, moderate, severe)     Heaviness feeling in eyes , mild pain 7. CONTACTS-GLASSES: Do you wear contacts or glasses?     glasses 8. CAUSE: What do you think is causing this visual problem?     Not sure  9. OTHER SYMPTOMS: Do you have any other symptoms?  (e.g., confusion, headache, arm or leg weakness, speech problems)     Mild headache, blurred vision . No weakness on either side of body no N/T to face , no worsening dizziness than baseline. Last Blood glucose from dexcom 7 115.   10. PREGNANCY: Is there any chance you are pregnant? When was your last menstrual period?       na  Protocols used: Vision Loss or Change-A-AH

## 2024-03-10 ENCOUNTER — Other Ambulatory Visit (HOSPITAL_BASED_OUTPATIENT_CLINIC_OR_DEPARTMENT_OTHER): Payer: Self-pay

## 2024-03-11 DIAGNOSIS — F3181 Bipolar II disorder: Secondary | ICD-10-CM | POA: Diagnosis not present

## 2024-03-15 ENCOUNTER — Other Ambulatory Visit (HOSPITAL_BASED_OUTPATIENT_CLINIC_OR_DEPARTMENT_OTHER): Payer: Self-pay

## 2024-03-15 ENCOUNTER — Other Ambulatory Visit (HOSPITAL_COMMUNITY): Payer: Self-pay

## 2024-03-15 ENCOUNTER — Other Ambulatory Visit: Payer: Self-pay

## 2024-03-15 DIAGNOSIS — Z79899 Other long term (current) drug therapy: Secondary | ICD-10-CM | POA: Diagnosis not present

## 2024-03-15 DIAGNOSIS — L438 Other lichen planus: Secondary | ICD-10-CM | POA: Diagnosis not present

## 2024-03-15 MED ORDER — CLOBETASOL PROPIONATE 0.05 % EX OINT
1.0000 | TOPICAL_OINTMENT | Freq: Two times a day (BID) | CUTANEOUS | 6 refills | Status: AC
  Filled 2024-03-15 – 2024-03-21 (×3): qty 60, 30d supply, fill #0

## 2024-03-15 MED ORDER — TACROLIMUS 1 MG PO CAPS
ORAL_CAPSULE | ORAL | 2 refills | Status: AC
  Filled 2024-03-15: qty 6, 75d supply, fill #0
  Filled 2024-03-15 – 2024-03-21 (×2): qty 30, 30d supply, fill #0

## 2024-03-15 MED ORDER — MYCOPHENOLATE MOFETIL 500 MG PO TABS
ORAL_TABLET | ORAL | 2 refills | Status: AC
  Filled 2024-03-15 (×2): qty 120, 30d supply, fill #0
  Filled 2024-03-21: qty 120, 20d supply, fill #0
  Filled 2024-04-20: qty 120, 20d supply, fill #1
  Filled 2024-05-24: qty 120, 20d supply, fill #2

## 2024-03-16 ENCOUNTER — Encounter: Payer: Self-pay | Admitting: Pharmacist

## 2024-03-16 ENCOUNTER — Other Ambulatory Visit: Payer: Self-pay

## 2024-03-19 ENCOUNTER — Other Ambulatory Visit (HOSPITAL_BASED_OUTPATIENT_CLINIC_OR_DEPARTMENT_OTHER): Payer: Self-pay

## 2024-03-19 ENCOUNTER — Other Ambulatory Visit: Payer: Self-pay

## 2024-03-21 ENCOUNTER — Other Ambulatory Visit: Payer: Self-pay | Admitting: Sports Medicine

## 2024-03-21 DIAGNOSIS — M2550 Pain in unspecified joint: Secondary | ICD-10-CM

## 2024-03-22 ENCOUNTER — Other Ambulatory Visit (HOSPITAL_COMMUNITY): Payer: Self-pay

## 2024-03-22 ENCOUNTER — Other Ambulatory Visit: Payer: Self-pay

## 2024-03-22 ENCOUNTER — Other Ambulatory Visit (HOSPITAL_BASED_OUTPATIENT_CLINIC_OR_DEPARTMENT_OTHER): Payer: Self-pay

## 2024-03-22 MED ORDER — PREGABALIN 75 MG PO CAPS
75.0000 mg | ORAL_CAPSULE | Freq: Three times a day (TID) | ORAL | 3 refills | Status: DC
  Filled 2024-03-22: qty 90, 30d supply, fill #0
  Filled 2024-04-22 – 2024-05-09 (×2): qty 90, 30d supply, fill #1

## 2024-03-23 ENCOUNTER — Other Ambulatory Visit (HOSPITAL_COMMUNITY): Payer: Self-pay

## 2024-03-23 ENCOUNTER — Other Ambulatory Visit: Payer: Self-pay

## 2024-03-23 DIAGNOSIS — F3181 Bipolar II disorder: Secondary | ICD-10-CM | POA: Diagnosis not present

## 2024-03-24 ENCOUNTER — Other Ambulatory Visit: Payer: Self-pay

## 2024-03-24 ENCOUNTER — Other Ambulatory Visit (HOSPITAL_COMMUNITY): Payer: Self-pay

## 2024-03-24 ENCOUNTER — Other Ambulatory Visit (HOSPITAL_BASED_OUTPATIENT_CLINIC_OR_DEPARTMENT_OTHER): Payer: Self-pay

## 2024-03-24 ENCOUNTER — Encounter: Payer: Self-pay | Admitting: Pharmacist

## 2024-03-26 ENCOUNTER — Other Ambulatory Visit (HOSPITAL_COMMUNITY): Payer: Self-pay

## 2024-03-26 ENCOUNTER — Ambulatory Visit (INDEPENDENT_AMBULATORY_CARE_PROVIDER_SITE_OTHER): Admitting: Adult Health

## 2024-03-26 ENCOUNTER — Encounter: Payer: Self-pay | Admitting: Adult Health

## 2024-03-26 ENCOUNTER — Other Ambulatory Visit: Payer: Self-pay

## 2024-03-26 DIAGNOSIS — F410 Panic disorder [episodic paroxysmal anxiety] without agoraphobia: Secondary | ICD-10-CM | POA: Diagnosis not present

## 2024-03-26 DIAGNOSIS — F411 Generalized anxiety disorder: Secondary | ICD-10-CM | POA: Diagnosis not present

## 2024-03-26 DIAGNOSIS — G4733 Obstructive sleep apnea (adult) (pediatric): Secondary | ICD-10-CM | POA: Diagnosis not present

## 2024-03-26 DIAGNOSIS — F4310 Post-traumatic stress disorder, unspecified: Secondary | ICD-10-CM | POA: Diagnosis not present

## 2024-03-26 DIAGNOSIS — F339 Major depressive disorder, recurrent, unspecified: Secondary | ICD-10-CM | POA: Diagnosis not present

## 2024-03-26 MED ORDER — TRAZODONE HCL 100 MG PO TABS: 100.0000 mg | ORAL_TABLET | Freq: Every day | ORAL | Status: DC

## 2024-03-26 MED ORDER — LAMOTRIGINE 200 MG PO TABS
400.0000 mg | ORAL_TABLET | Freq: Every day | ORAL | 1 refills | Status: DC
  Filled 2024-03-26: qty 180, 90d supply, fill #0

## 2024-03-26 MED ORDER — HYDROXYZINE HCL 25 MG PO TABS
25.0000 mg | ORAL_TABLET | Freq: Three times a day (TID) | ORAL | 1 refills | Status: DC | PRN
  Filled 2024-03-26 – 2024-07-01 (×2): qty 270, 90d supply, fill #0

## 2024-03-26 MED ORDER — CLONAZEPAM 1 MG PO TABS
1.0000 mg | ORAL_TABLET | Freq: Three times a day (TID) | ORAL | 2 refills | Status: DC
  Filled 2024-03-26 – 2024-04-06 (×2): qty 90, 30d supply, fill #0
  Filled 2024-05-17: qty 90, 30d supply, fill #1
  Filled 2024-06-27: qty 90, 30d supply, fill #2

## 2024-03-26 MED ORDER — RISPERIDONE 2 MG PO TABS
2.0000 mg | ORAL_TABLET | Freq: Every day | ORAL | 1 refills | Status: DC
  Filled 2024-03-26 – 2024-04-06 (×2): qty 90, 90d supply, fill #0
  Filled 2024-07-01: qty 90, 90d supply, fill #1

## 2024-03-26 MED ORDER — SERTRALINE HCL 100 MG PO TABS
200.0000 mg | ORAL_TABLET | Freq: Every morning | ORAL | 3 refills | Status: AC
  Filled 2024-03-26 – 2024-04-13 (×2): qty 180, 90d supply, fill #0
  Filled 2024-08-04 – 2024-08-18 (×2): qty 180, 90d supply, fill #1

## 2024-03-26 MED ORDER — ARMODAFINIL 200 MG PO TABS
200.0000 mg | ORAL_TABLET | Freq: Every day | ORAL | 2 refills | Status: DC | PRN
  Filled 2024-03-26 – 2024-04-22 (×2): qty 30, 30d supply, fill #0
  Filled 2024-05-17 – 2024-05-21 (×2): qty 30, 30d supply, fill #1

## 2024-03-26 MED ORDER — TRAZODONE HCL 100 MG PO TABS
100.0000 mg | ORAL_TABLET | Freq: Every day | ORAL | 1 refills | Status: DC
  Filled 2024-03-26: qty 90, 90d supply, fill #0

## 2024-03-26 MED ORDER — LAMOTRIGINE 200 MG PO TABS: 400.0000 mg | ORAL_TABLET | Freq: Every day | ORAL | Status: DC

## 2024-03-26 NOTE — Progress Notes (Signed)
 Tracy [name] [id number] Aug 31, 1965 58 y.o.  Subjective:   Patient ID:  Tracy [name] is a 58 y.o. (DOB Dec 07, 1965) female.  Chief Complaint: No chief complaint on file.   HPI Tracy [name] presents to the office today for follow-up of MDD, GAD, PTSD, and Panic attacks.  Describes mood today as ok. Pleasant. Reports decreased tearfulness. Mood symptoms - denies depression. Reports situational anxiety and irritability. Reports improved interest and motivation. Reports one recent panic attacks. Denies worry, rumination, and over thinking. Denies intrusive thoughts. Reports some flashbacks. Reports mood is stable. Stating I'm doing good. Feels like medications are helpful. Seeing therapist regularly. Taking medications as prescribed. Energy levels improved. Active, does not have a regular exercise routine. Walking dogs. Enjoys some usual interests and activities. Lives alone with 3 dogs. Divorced. Siblings local. Spending time with family. Attends church. Appetite adequate. Weight loss - 280 pounds.  Reports difficulties with sleep. Averages 3 to 5 hours. Sleep apnea - using CPAP machine - taking Nuvigil  200mg  in the morning.  Denies SI or HI. Denies AH or VH.  Denies self harm. Denies substance use.  Working with Donny Payor - therapy.  Previous medication trials:  Antipsychotics: Vraylar-TD, Latuda, Abilify, Geodon, Rexulti, Lybalvi, Risperdal   Mood Stabilizers - Depakote, Lithium, Lamictal , Equetro, Topamax , Trileptal, Gabapentin   SSRI - Zoloft , Lexapro, Celexa, Prozac , Viibryd, Trintellix , Prozac   SNRI - Pristiq, Effexor , Cymbalta   Wellbutrin - allergic reaction  Anti-anxiety - Buspar, Xanax , Clonazepam , Ativan, Valium  Sleep agents - Trazadone, Ambien , Restoril   Other - Deplin, Emsam, Serzone, Doxepin, Nuvigil , Vistaril   Previous treatment: ECT and TMS    GAD-7    Flowsheet Row Office Visit from 07/30/2022 in [facility] Primary Care & Sports Medicine at  [facility] Office Visit from 03/14/2021 in [facility] Primary Care & Sports Medicine at [facility] Office Visit from 02/14/2021 in [facility] Primary Care & Sports Medicine at [facility] Office Visit from 03/01/2020 in [facility] Primary Care & Sports Medicine at [facility]  Total GAD-7 Score 21 17 12 11    PHQ2-9    Flowsheet Row Office Visit from 03/03/2024 in [facility] Office Visit from 07/30/2022 in [facility] Primary Care & Sports Medicine at [facility] Office Visit from 03/14/2021 in [facility] Primary Care & Sports Medicine at [facility] Office Visit from 02/14/2021 in [facility] Primary Care & Sports Medicine at [facility] Office Visit from 10/11/2020 in [facility] at [facility] Total Score 1 3 6 6 6   PHQ-9 Total Score 8 11 19 21 19    Flowsheet Row ED from 08/15/2023 in [facility] Emergency Department at [facility] IR [id number] from [facility]2023 [facility] Admission (Discharged) from 10/17/2022 in [place] PERIOPERATIVE AREA  C-SSRS RISK CATEGORY No Risk No Risk No Risk     Review of Systems:  Review of Systems  Musculoskeletal:  Negative for gait problem.  Neurological:  Negative for tremors.  Psychiatric/Behavioral:         Please refer to HPI    Medications: I have reviewed the patient's current medications.  Current Outpatient Medications  Medication Sig Dispense Refill   AMBULATORY NON FORMULARY MEDICATION Rollator for use daily. Elevated toilet seat with handlebars 1 each 0   Armodafinil  200 MG TABS Take 1 tablet (200 mg total) by mouth daily as needed (sleepiness). 30 tablet 2   Atogepant  (QULIPTA ) 60 MG TABS Take 1  tablet (60 mg total) by mouth daily. 30 tablet 11   Blood Glucose Monitoring Suppl (FREESTYLE LITE) w/Device KIT Use as directed. 1 kit 0   Blood Glucose  Monitoring Suppl (FREESTYLE LITE) w/Device KIT Check fasting blood sugar every morning and before meals up to 4 times daily. 1 kit PRN   botulinum toxin Type A  (BOTOX ) 200 units injection Inject 200 Units into the muscle every 3 (three) months. 1 each 2   clobetasol  (TEMOVATE ) 0.05 % GEL Apply small amount to affected area twice daily using finger. Gently pat dry with cotton gauze.  Do not eat,drink, or speak for 30 minutes to allow absorption. 30 g 2   clobetasol  ointment (TEMOVATE ) 0.05 % Apply topically 2 (two) times daily. 60 g 6   clonazePAM  (KLONOPIN ) 1 MG tablet Take 1 tablet (1 mg total) by mouth 3 (three) times daily. 90 tablet 2   Continuous Glucose Sensor (DEXCOM G7 SENSOR) MISC Use to check glucose throughout the day.  Change every 10 days 9 each 1   Continuous Glucose Transmitter (DEXCOM G6 TRANSMITTER) MISC Use to check continuous blood sugar.  Replace every 90 days 1 each 3   cyclobenzaprine  (FLEXERIL ) 10 MG tablet Take 1 tablet (10 mg total) by mouth 3 (three) times daily as needed for muscle spasms. 30 tablet 1   diclofenac  Sodium (VOLTAREN ) 1 % GEL Apply 1 Application topically 4 (four) times daily as needed (pain).     fluconazole  (DIFLUCAN ) 100 MG tablet Take 1 tablet (100mg  total) by mouth every other day for 10 days 5 tablet 0   fluconazole  (DIFLUCAN ) 100 MG tablet Take 1 tablet (100 mg total) by mouth every other day. 5 tablet 0   folic acid  (FOLVITE ) 1 MG tablet Take 1 tablet (1 mg total) by mouth daily. 90 tablet 1   furosemide  (LASIX ) 40 MG tablet Take 1 tablet (40 mg total) by mouth daily. 90 tablet 0   glucose blood (FREESTYLE LITE) test strip Use as instructed 100 each 12   glucose blood (FREESTYLE LITE) test strip Check fasting blood sugar every morning and before meals up to 4 times a day as directed. 100 strip 12   hydrOXYzine  (ATARAX ) 25 MG tablet Take 1 tablet (25 mg total) by mouth 3 (three) times daily as needed for anxiety. 270 tablet 1   Insulin  Pen Needle  (UNIFINE PENTIPS) 31G X 5 MM MISC Use as directed to inject insulin  100 each 3   ketorolac  (TORADOL ) 10 MG tablet Take 1 tablet (10 mg total) by mouth every 8 (eight) hours as needed. 20 tablet 0   lamoTRIgine  (LAMICTAL ) 200 MG tablet Take 2 tablets (400 mg total) by mouth at bedtime.     Lancets 30G MISC Check fasting blood sugar every morning and before meals up to 4 times daily. 100 each 12   lidocaine  (LIDODERM ) 5 % Place 1 patch onto the skin every 12 (twelve) hours. Remove & Discard patch within 12 hours or as directed by MD 30 patch 0   lidocaine  (XYLOCAINE ) 2 % solution Swish and spit 5 mLs in the mouth or throat 4 (four) times daily as needed for pain for up to 14 days 100 mL 0   lidocaine  (XYLOCAINE ) 5 % ointment Apply topically to affected areas 3 times daily as needed. 35.44 g 0   lisinopril  (ZESTRIL ) 5 MG tablet Take 1 tablet (5 mg total) by mouth daily. 90 tablet 2   meloxicam  (MOBIC ) 15 MG tablet Take 1 tablet by  mouth once every morning with a meal for 2 weeks, then daily as needed for pain. 30 tablet 0   Menthol , Topical Analgesic, (BENGAY EX) Apply 1 Application topically daily as needed (pain).     mycophenolate  (CELLCEPT ) 500 MG tablet Take 2 tablets (1,000mg ) twice per day; if you are tolerating it well after 2 weeks and labs look good increase to 3 tablets (1,500mg ) twice per day 120 tablet 2   neomycin-bacitracin -polymyxin [phone] OINT Apply topically 2 (two) times daily for 10 days To corners of mouth (Patient taking differently: Apply 1 Application topically 2 (two) times daily as needed (cracking/ raw skin around mouth).) 14 g 2   nystatin  (NYAMYC ) powder Apply 3 times daily as directed. 60 g 3   nystatin  ointment (MYCOSTATIN ) Apply to the skin 2 times daily to corners of mouth (Patient taking differently: Apply 1 Application topically 2 (two) times daily as needed (raw/cracking skin around the mouth).) 15 g 2   ondansetron  (ZOFRAN -ODT) 4 MG disintegrating tablet Dissolve  1 tablet (4 mg total) by mouth 2 (two) times daily as needed for nausea or vomiting. 20 tablet 1   pantoprazole  (PROTONIX ) 40 MG tablet Take 2 tablets (80 mg total) by mouth 2 (two) times daily. 60 tablet 3   pimecrolimus  (ELIDEL ) 1 % cream Apply a small amount to affected area 2 x daily. Gently pat area dry w/cotton gauze. No eating, drinking or speaking for 20 - 30 minutes to allow medicine to be absorbed. Can apply to 2x2 gauze & placed against the affected area. (Patient taking differently: Apply 1 Application topically 2 (two) times daily as needed (ulcers).) 60 g 3   predniSONE  (DELTASONE ) 20 MG tablet Take 3 tablets (60 mg total) by mouth once daily for 3 days, THEN 2 tablets (40 mg total) once daily for 3 days, THEN 1 tablet (20 mg total) once daily for 3 days. Take daily dose all in the morning with breakfast. 18 tablet 0   pregabalin  (LYRICA ) 75 MG capsule Take 1 capsule (75 mg total) by mouth 3 (three) times daily. 90 capsule 3   risperiDONE  (RISPERDAL ) 2 MG tablet Take 1 tablet (2 mg total) by mouth at bedtime. 90 tablet 1   rosuvastatin  (CRESTOR ) 20 MG tablet Take 1 tablet (20 mg total) by mouth daily. 90 tablet 3   sertraline  (ZOLOFT ) 100 MG tablet Take 2 tablets (200 mg total) by mouth every morning. 180 tablet 3   spironolactone  (ALDACTONE ) 50 MG tablet Take 1 tablet (50 mg total) by mouth 2 (two) times daily. 180 tablet 2   SUMAtriptan  (IMITREX ) 100 MG tablet Take 1 tablet (100 mg total) by mouth every 2 (two) hours as needed for migraine. May repeat in 2 hours if headache persists or recurs. 10 tablet 11   SYNTHROID  200 MCG tablet Take 1 tablet (200 mcg total) by mouth daily before breakfast. 90 tablet 1   tacrolimus  (PROGRAF ) 1 MG capsule Open two capsules and dissolve in (0.5 liters) of water . Label well and keep in fridge. Swish and spit 10mL twice a day. 30 capsule 2   tirzepatide  (MOUNJARO ) 12.5 MG/0.5ML Pen Inject 12.5 mg into the skin once a week. 6 mL 0   tiZANidine   (ZANAFLEX ) 4 MG tablet Take 1 tablet (4 mg total) by mouth every 6 (six) hours as needed. 30 tablet 6   traMADol  (ULTRAM ) 50 MG tablet Take 2 tablets (100 mg total) by mouth every 8 (eight) hours as needed for moderate pain. 180 tablet  0   traZODone  (DESYREL ) 100 MG tablet Take 1 tablet (100 mg total) by mouth at bedtime.     Current Facility-Administered Medications  Medication Dose Route Frequency Provider Last Rate Last Admin   botulinum toxin Type A  (BOTOX ) injection 155 Units  155 Units Intramuscular Once Onita Duos, MD        Medication Side Effects: None  Allergies:  Allergies  Allergen Reactions   Sulfa Antibiotics Itching, Swelling and Rash   Food     Walnut-mouth swelling/itching   Tilactase Swelling    Walnut-mouth swelling/itching   Bupropion Hcl Itching and Rash   Codeine Itching    Past Medical History:  Diagnosis Date   Acanthosis nigricans, acquired 02/09/2015   Allergy ?   Anemia    Anxiety and depression 10/31/2014   Arthritis    back, knees, right elbow   Arthritis of left hip 06/25/2018   Bilateral swelling of feet and ankles    Bipolar disorder (HCC)    Borderline personality disorder (HCC)    Cancer (HCC)    oral lichen planus, uses mouth wash, cream   Carpal tunnel syndrome 09/06/2015   Chewing difficulty    Chronic left shoulder pain 05/17/2020   Constipation    Coronary artery disease 03/21/2021   COVID 10/08/2021   Cystic teratoma    BENIGN   Dental crowns present    Diabetes mellitus without complication (HCC)    Diabetes mellitus, type 2 (HCC) 06/10/2013   Diarrhea    Diverticulosis of colon without hemorrhage    DOE (dyspnea on exertion) 03/21/2021   Dyspepsia 12/14/2020   Essential hypertension 10/02/2007   Qualifier: Diagnosis of  By: Kassie MD, Sean A    Facial rash 06/16/2019   Female hirsutism 02/09/2015   Food allergy    Walnuts   GAD (generalized anxiety disorder)    Gastritis and gastroduodenitis    Gastroesophageal  reflux disease    Grade II internal hemorrhoids    Greater trochanteric bursitis of left hip 07/24/2018   Hemorrhoids, internal 07/18/2017   History of kidney stones    History of migraine headaches    History of posttraumatic stress disorder (PTSD)    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/2015   Hyperlipidemia    Hypothyroidism    HYPOTHYROIDISM, POSTSURGICAL 10/02/2007   Qualifier: Diagnosis of  By: Kassie MD, Sean A    IBS (irritable bowel syndrome)    Infertility associated with anovulation 02/09/2015   Insulin  resistance 02/09/2015   Joint pain    Leg swelling 09/21/2020   Lichen planus    Liver cirrhosis secondary to NASH (nonalcoholic steatohepatitis) (HCC)    Lower back pain 07/18/2017   MDD (major depressive disorder) 06/06/2015   Migraine 10/02/2007   Morbid obesity with BMI of 40.0-44.9, adult (HCC) 06/23/2020   NASH (nonalcoholic steatohepatitis)    Nausea without vomiting    Nonalcoholic fatty liver disease 02/09/2015   Obstructive sleep apnea 06/27/2014   Oral mucosal lesion 06/17/2019   Osteoarthritis    Other fatigue    PCOS (polycystic ovarian syndrome)    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   Please see review of systems for further details on the patient's review from today.   Objective:   Physical Exam:  LMP 08/24/2020   Physical Exam Constitutional:      General: She is not in acute distress. Musculoskeletal:        General: No deformity.  Neurological:     Mental Status: She is alert and oriented to person, place, and time.     Coordination: Coordination normal.  Psychiatric:        Attention and Perception: Attention and perception normal. She does not perceive auditory or visual hallucinations.        Mood and Affect: Mood normal. Mood is not anxious or depressed. Affect is not labile, blunt, angry or inappropriate.        Speech: Speech normal.        Behavior: Behavior normal.        Thought Content: Thought content normal. Thought content is not paranoid or delusional. Thought content does not include homicidal or suicidal ideation. Thought content does not include homicidal or suicidal plan.        Cognition and Memory: Cognition and memory normal.        Judgment: Judgment normal.     Comments: Insight intact     Lab Review:     Component Value Date/Time   NA 136 03/03/2024 1559   K 4.2 03/03/2024 1559   CL 98 03/03/2024 1559   CO2 20 03/03/2024 1559   GLUCOSE 92 03/03/2024 1559   GLUCOSE 91 08/15/2023 1004   BUN 13 03/03/2024 1559   CREATININE 1.07 (H) 03/03/2024 1559   CREATININE 0.95 11/28/2022 1509   CALCIUM  9.8 03/03/2024 1559   PROT 7.2 03/03/2024 1559   ALBUMIN 4.4 03/03/2024 1559   AST 18 03/03/2024 1559   ALT 18 03/03/2024 1559   ALKPHOS 147 (H) 03/03/2024 1559   BILITOT 0.4 03/03/2024 1559   GFRNONAA >60 08/15/2023 1004   GFRNONAA 99 12/26/2020 0000   GFRAA 115 12/26/2020 0000       Component Value Date/Time   WBC 7.9 03/03/2024 1559   WBC 4.9 08/15/2023 1004   RBC 5.37 (H) 03/03/2024 1559    RBC 4.49 08/15/2023 1004   HGB 15.9 03/03/2024 1559   HCT 50.0 (H) 03/03/2024 1559   PLT 190 03/03/2024 1559   MCV 93 03/03/2024 1559   MCH 29.6 03/03/2024 1559   MCH 30.7 08/15/2023 1004   MCHC 31.8 03/03/2024 1559   MCHC 33.9 08/15/2023 1004   RDW 14.5 03/03/2024 1559   LYMPHSABS 2.2 03/03/2024 1559   MONOABS 0.3 08/15/2023 1004   EOSABS 0.1 03/03/2024 1559   BASOSABS 0.0 03/03/2024 1559    No results found for: POCLITH, LITHIUM   No results found for: PHENYTOIN, PHENOBARB, VALPROATE, CBMZ   .res Assessment: Plan:    Plan:  PDMP reviewed  Lamictal  - 2 -  200mg  daily Zoloft  100mg  - 2 daily Nuvigil  200mg  daily - takes every day Clonazepam  1mg  TID as needed for anxiety Hydroxyzine  25mg  TID - using 1 tablet at bedtime.  Risperdal  2mg  at hs  Trazadone 100mg  - take one tablet at bedtime  Time spent with patient was 25 minutes. Greater than 50% of face to face time with patient was spent on counseling and coordination of care.      Donny Payor - therapist  RTC 3 months  25 minutes spent dedicated to the care of this patient on the date of this encounter to include pre-visit review of records, ordering of medication, post visit documentation, and face-to-face time with the patient discussing MDD, GAD, PTSD, and Panic attacks.  Discussed continuing current medication regimen.  Patient advised to contact office with any questions, adverse effects, or acute worsening in signs and symptoms.  Counseled patient regarding potential benefits, risks, and side effects of Lamictal  to include potential risk of Stevens-Johnson syndrome. Advised patient to stop taking Lamictal  and contact office immediately if rash develops and to seek urgent medical attention if rash is severe and/or spreading quickly.   Discussed potential benefits, risk, and side effects of benzodiazepines to include potential risk of tolerance and dependence, as well as possible drowsiness. Advised  patient not to drive if experiencing drowsiness and to take lowest possible effective dose to minimize risk of dependence and tolerance.  Discussed potential metabolic side effects associated with atypical antipsychotics, as well as potential risk for movement side effects. Advised pt to contact office if movement side effects occur.    Diagnoses and all orders for this visit:  Recurrent major depression resistant to treatment (HCC) -     risperiDONE  (RISPERDAL ) 2 MG tablet; Take 1 tablet (2 mg total) by mouth at bedtime. -     lamoTRIgine  (LAMICTAL ) 200 MG tablet; Take 2 tablets (400 mg total) by mouth at bedtime. -     traZODone  (DESYREL ) 100 MG tablet; Take 1 tablet (100 mg total) by mouth at bedtime.  OSA (obstructive sleep apnea) -     Armodafinil  200 MG TABS; Take 1 tablet (200 mg total) by mouth daily as needed (sleepiness).  Panic attacks -     clonazePAM  (KLONOPIN ) 1 MG tablet; Take 1 tablet (1 mg total) by mouth 3 (three) times daily. -     sertraline  (ZOLOFT ) 100 MG tablet; Take 2 tablets (200 mg total) by mouth every morning.  GAD (generalized anxiety disorder) -     sertraline  (ZOLOFT ) 100 MG tablet; Take 2 tablets (200 mg total) by mouth every morning. -     hydrOXYzine  (ATARAX ) 25 MG tablet; Take 1 tablet (25 mg total) by mouth 3 (three) times daily as needed for anxiety.  PTSD (post-traumatic stress disorder) -     sertraline  (ZOLOFT ) 100 MG tablet; Take 2 tablets (200 mg total) by mouth every morning.  Other orders -     Discontinue: lamoTRIgine  (LAMICTAL ) 200 MG tablet; Take 2 tablets (400 mg total) by mouth at bedtime. -     Discontinue: traZODone  (DESYREL ) 100 MG tablet; Take 1 tablet (100 mg total) by mouth at bedtime.     Please see After Visit Summary for patient specific instructions.  Future Appointments  Date Time Provider Department Center  03/30/2024  1:40 PM Sirivol, Mamatha, MD [facility] None  06/25/2024  8:30 AM Myiah Petkus Nattalie, NP [facility] None   07/05/2024  3:00 PM Onita Duos, MD [facility] None    No orders of  the defined types were placed in this encounter.   -------------------------------

## 2024-03-30 ENCOUNTER — Other Ambulatory Visit: Payer: Self-pay

## 2024-03-30 ENCOUNTER — Other Ambulatory Visit (HOSPITAL_COMMUNITY): Payer: Self-pay

## 2024-03-30 ENCOUNTER — Ambulatory Visit (INDEPENDENT_AMBULATORY_CARE_PROVIDER_SITE_OTHER)

## 2024-03-30 VITALS — BP 120/74 | HR 74 | Temp 98.4°F | Ht 71.0 in | Wt 277.0 lb

## 2024-03-30 DIAGNOSIS — L439 Lichen planus, unspecified: Secondary | ICD-10-CM | POA: Diagnosis not present

## 2024-03-30 DIAGNOSIS — K7460 Unspecified cirrhosis of liver: Secondary | ICD-10-CM | POA: Diagnosis not present

## 2024-03-30 DIAGNOSIS — M170 Bilateral primary osteoarthritis of knee: Secondary | ICD-10-CM

## 2024-03-30 DIAGNOSIS — K7581 Nonalcoholic steatohepatitis (NASH): Secondary | ICD-10-CM | POA: Diagnosis not present

## 2024-03-30 DIAGNOSIS — Z0000 Encounter for general adult medical examination without abnormal findings: Secondary | ICD-10-CM | POA: Diagnosis not present

## 2024-03-30 DIAGNOSIS — Z01818 Encounter for other preprocedural examination: Secondary | ICD-10-CM | POA: Insufficient documentation

## 2024-03-30 MED ORDER — MOUNJARO 12.5 MG/0.5ML ~~LOC~~ SOAJ
12.5000 mg | SUBCUTANEOUS | 2 refills | Status: DC
  Filled 2024-03-30: qty 2, 28d supply, fill #0
  Filled 2024-04-20 – 2024-04-22 (×2): qty 2, 28d supply, fill #1
  Filled 2024-05-24 (×2): qty 6, 84d supply, fill #2

## 2024-03-30 MED ORDER — TRAMADOL HCL 50 MG PO TABS
50.0000 mg | ORAL_TABLET | Freq: Every day | ORAL | 0 refills | Status: DC | PRN
  Filled 2024-03-30: qty 30, 30d supply, fill #0

## 2024-03-30 MED ORDER — ONDANSETRON 4 MG PO TBDP
4.0000 mg | ORAL_TABLET | Freq: Two times a day (BID) | ORAL | 1 refills | Status: AC | PRN
  Filled 2024-03-30: qty 20, 10d supply, fill #0
  Filled 2024-07-01: qty 20, 10d supply, fill #1

## 2024-03-30 NOTE — Progress Notes (Unsigned)
 Subjective:  Patient ID: Tracy [name], female    DOB: 09-Oct-1965  Age: 58 y.o. MRN: [id number]  Chief Complaint  Patient presents with  . Annual Exam    Well Adult Physical: Patient here for a comprehensive physical exam and follow up. Do you take any herbs or supplements that were not prescribed by a doctor? no Are you taking calcium  supplements? no Are you taking aspirin  daily? no  Encounter for general adult medical examination without abnormal findings  Physical (At Risk items are starred): Patient's last physical exam was 1 year ago .  Patient is not afflicted from Stress Incontinence and Urge Incontinence  Patient wears a seat belts Patient has smoke detectors and has carbon monoxide detectors. Patient practices appropriate gun safety. Patient wears sunscreen with extended sun exposure. Dental Care: biannual cleanings, brushes and flosses daily. Ophthalmology/Optometry: Annual visit.  Hearing loss: none Vision impairments: none   Menstrual History: post-menopausal Pregnancy history: G0P0 Safe at home: yes Self breast exams: yes  Discussed the use of AI scribe software for clinical note transcription with the patient, who gave verbal consent to proceed.  History of Present Illness   Tracy [name] is a 58 year old female with liver cirrhosis who presents for a follow-up regarding abdominal pain and medication management.  Right lateral abdominal pain - Intermittent and sometimes constant pain in the right lateral abdomen for several months - Pain severity can be intense, causing crying - Describes pain as 'hitting me and then going away' - Pain sometimes worsened by movement - Heat pad provides some relief - Pain has worsened over the past month  Liver cirrhosis and surveillance - Diagnosis of liver cirrhosis - Overdue for routine liver ultrasound, which is typically performed every six months - Attempted to contact [facility] for referral but encountered  issues with contact information  Immunosuppressive therapy and associated symptoms - Currently taking CellCept  for autoimmune disorder - Experiences nausea, managed with Zofran  - On clonazepam  - Previously used tramadol  for pain management, but prefers to avoid frequent use - Last tramadol  refill in October 2024  Oral lichen planus and mucosal care - History of oral lichen planus - Under treatment with CellCept  and tacrolimus  - Cautious of infections - Uses antifungal treatments for cracks in the corners of her mouth  Weight loss - Weight decreased from 285 pounds on July 9th to 277 pounds currently - Attributes weight loss to slow and steady efforts  Tobacco use - Quit smoking in July 2020 after 15 years of smoking half a pack per day  Menopausal status - Postmenopausal  Depressive symptoms - Recent depression scale score of 11, which she considers 'good for me'  Preventive health maintenance - Up to date with dental and mammogram exams - Needs to schedule a diabetic eye exam         03/30/2024    1:33 PM 03/03/2024    3:06 PM 07/30/2022   11:29 AM 09/04/2021    1:08 PM 03/14/2021    2:57 PM  Depression screen PHQ 2/9  Decreased Interest 2 1 1  3   Down, Depressed, Hopeless 2 0 2  3  PHQ - 2 Score 4 1 3  6   Altered sleeping 3 3 0  2  Tired, decreased energy 3 2 2  3   Change in appetite 0 1 2  2   Feeling bad or failure about yourself  0 0 3  3  Trouble concentrating 1 1 1   2  Moving slowly or fidgety/restless 0 0 0  1  Suicidal thoughts 0 0 0  0  PHQ-9 Score 11 8 11  19   Difficult doing work/chores Somewhat difficult Somewhat difficult Somewhat difficult  Extremely dIfficult     Information is confidential and restricted. Go to Review Flowsheets to unlock data.         07/25/2022    1:59 PM 11/28/2023   11:50 AM 12/25/2023    3:06 PM 03/03/2024    3:06 PM 03/30/2024    1:33 PM  Fall Risk  Falls in the past year? 1 1 1 1 1   Was there an injury with Fall? 1 1 1 1  1   Fall Risk Category Calculator 2 3 3 3 3   Fall Risk Category (Retired) Moderate       (RETIRED) Patient Fall Risk Level Moderate fall risk       Patient at Risk for Falls Due to Impaired balance/gait;Impaired mobility History of fall(s) Impaired balance/gait;Impaired vision;Impaired mobility;History of fall(s) History of fall(s) History of fall(s)  Fall risk Follow up Falls evaluation completed  Falls evaluation completed  Falls evaluation completed Falls evaluation completed     Data saved with a previous flowsheet row definition             Social Hx   Social History   Socioeconomic History  . Marital status: Divorced    Spouse name: Not on file  . Number of children: 0  . Years of education: 16  . Highest education level: Bachelor's degree (e.g., BA, AB, BS)  Occupational History  . 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 Smoking status: Former    Current packs/day: 0.00    Average packs/day: 0.5 packs/day for 15.0 years (7.5 ttl pk-yrs)    Types: Cigarettes    Start date: 02/2004    Quit date: 02/2019    Years since quitting: 5.1  . Smokeless tobacco: Never  Vaping Use  . Vaping status: Never Used  Substance and Sexual Activity  . Alcohol use: No    Comment: Has not had any alcohol since 07/2014 - before this date, she rarely drink.  . Drug use: No  . Sexual activity: Not Currently    Partners: Male    Birth control/protection: Post-menopausal    Comment: Divorced  Other Topics Concern  . Not on file  Social History Narrative   RN - [employer] [place]   Regular exercise: no   Caffeine  use: 2 x daily   Right-handed.   Lives alone.   Social Drivers of Corporate investment banker Strain: Low Risk  (03/01/2024)   Overall Financial Resource Strain (CARDIA)   . Difficulty of Paying Living Expenses: Not hard at all  Food Insecurity: No Food Insecurity (03/01/2024)   Hunger Vital  Sign   . Worried About Programme researcher, broadcasting/film/video in the Last Year: Never true   . Ran Out of Food in the Last Year: Never true  Transportation Needs: No Transportation Needs (03/01/2024)   PRAPARE - Transportation   . Lack of Transportation (Medical): No   . Lack of Transportation (Non-Medical): No  Physical Activity: Insufficiently Active (03/01/2024)   Exercise Vital Sign   . Days of Exercise per Week: 1 day   . Minutes of Exercise per Session: 10 min  Stress: Stress Concern Present (03/01/2024)   [employer] of Occupational Health - Occupational Stress Questionnaire   .  Feeling of Stress: Rather much  Social Connections: Moderately Isolated (03/01/2024)   Social Connection and Isolation Panel   . Frequency of Communication with Friends and Family: More than three times a week   . Frequency of Social Gatherings with Friends and Family: Once a week   . Attends Religious Services: More than 4 times per year   . Active Member of Clubs or Organizations: No   . Attends Banker Meetings: Not on file   . Marital Status: Divorced   Past Medical History:  Diagnosis Date  . Acanthosis nigricans, acquired 02/09/2015  . Allergy ?  SABRA Anemia   . Anxiety and depression 10/31/2014  . Arthritis    back, knees, right elbow  . Arthritis of left hip 06/25/2018  . Bilateral swelling of feet and ankles   . Bipolar disorder (HCC)   . Borderline personality disorder (HCC)   . Cancer (HCC)    oral lichen planus, uses mouth wash, cream  . Carpal tunnel syndrome 09/06/2015  . Chewing difficulty   . Chronic left shoulder pain 05/17/2020  . Constipation   . Coronary artery disease 03/21/2021  . COVID 10/08/2021  . Cystic teratoma    BENIGN  . Dental crowns present   . Diabetes mellitus without complication (HCC)   . Diabetes mellitus, type 2 (HCC) 06/10/2013  . Diarrhea   . Diverticulosis of colon without hemorrhage   . DOE (dyspnea on exertion) 03/21/2021  . Dyspepsia 12/14/2020  .  Essential hypertension 10/02/2007   Qualifier: Diagnosis of  By: Kassie MD, Alyce LABOR   . Facial rash 06/16/2019  . Female hirsutism 02/09/2015  . Food allergy    Walnuts  . GAD (generalized anxiety disorder)   . Gastritis and gastroduodenitis   . Gastroesophageal reflux disease   . Grade II internal hemorrhoids   . Greater trochanteric bursitis of left hip 07/24/2018  . Hemorrhoids, internal 07/18/2017  . History of kidney stones   . History of migraine headaches   . History of posttraumatic stress disorder (PTSD)   . 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 Hyperlipidemia   . Hypothyroidism   . HYPOTHYROIDISM, POSTSURGICAL 10/02/2007   Qualifier: Diagnosis of  By: Kassie MD, Sean A   . IBS (irritable bowel syndrome)   . Infertility associated with anovulation 02/09/2015  . Insulin  resistance 02/09/2015  . Joint pain   . Leg swelling 09/21/2020  . Lichen planus   . Liver cirrhosis secondary to NASH (nonalcoholic steatohepatitis) (HCC)   . Lower back pain 07/18/2017  . MDD (major depressive disorder) 06/06/2015  . Migraine 10/02/2007  . Morbid obesity with BMI of 40.0-44.9, adult (HCC) 06/23/2020  . NASH (nonalcoholic steatohepatitis)   . Nausea without vomiting   . Nonalcoholic fatty liver disease 02/09/2015  . Obstructive sleep apnea 06/27/2014  . Oral mucosal lesion 06/17/2019  . Osteoarthritis   . Other fatigue   . PCOS (polycystic ovarian syndrome)   . 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  . POLYCYSTIC OVARIAN DISEASE 10/02/2007   Qualifier: Diagnosis of  By: Kassie MD, Alyce LABOR   . PONV (postoperative nausea and vomiting)    also hx. of emergence delirium 2007  . Primary osteoarthritis of left knee 01/21/2018  . RENAL CALCULUS, HX OF 10/02/2007   Qualifier: Diagnosis of  By: Wilhemina RMA, Lucy    . Severe episode of recurrent major depressive disorder, without psychotic features (HCC)   . Shortness of breath on exertion   . Sleep apnea    On CPAP  .  Status post total  replacement of left hip 03/19/2019  . TOBACCO USE, QUIT 02/08/2010   Qualifier: Diagnosis of  By: Kassie MD, Alyce LABOR   . 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 Trigeminal neuralgia   . Unilateral primary osteoarthritis, left hip 02/10/2019  . Vitamin D  deficiency    Past Surgical History:  Procedure Laterality Date  . ACHILLES TENDON SURGERY Left 2007  . APPENDECTOMY    . CARPAL TUNNEL RELEASE Right 01/12/2016   Procedure: RIGHT CARPAL TUNNEL RELEASE;  Surgeon: Elsie Mussel, MD;  Location: [facility];  Service: Orthopedics;  Laterality: Right;  . COLONOSCOPY WITH PROPOFOL  N/A 04/20/2014   Procedure: COLONOSCOPY WITH PROPOFOL ;  Surgeon: Elsie Cree, MD;  Location: [facility];  Service: Endoscopy;  Laterality: N/A;  . COLONOSCOPY WITH PROPOFOL  N/A 01/10/2021   Procedure: COLONOSCOPY WITH PROPOFOL ;  Surgeon: San Sandor GAILS, DO;  Location: [facility];  Service: Gastroenterology;  Laterality: N/A;  . ESOPHAGOGASTRODUODENOSCOPY (EGD) WITH PROPOFOL  N/A 04/20/2014   Procedure: ESOPHAGOGASTRODUODENOSCOPY (EGD) WITH PROPOFOL ;  Surgeon: Elsie Cree, MD;  Location: [facility];  Service: Endoscopy;  Laterality: N/A;  . ESOPHAGOGASTRODUODENOSCOPY (EGD) WITH PROPOFOL  N/A 01/10/2021   Procedure: ESOPHAGOGASTRODUODENOSCOPY (EGD) WITH PROPOFOL ;  Surgeon: San Sandor GAILS, DO;  Location: [facility];  Service: Gastroenterology;  Laterality: N/A;  . EXCISION HAGLUND'S DEFORMITY WITH ACHILLES TENDON REPAIR Right 02/25/2013   Procedure: RIGHT ACHILLES DEBRIDEMENT AND RECONSTRUCTION;  HAGLUND'S EXCISION; GASTROC RECESSION AND FLEXOR HALLUCIS LONGUS TRANSFER;  Surgeon: Norleen Armor, MD;  Location: [facility];  Service: Orthopedics;  Laterality: Right;  . IR ANGIOGRAM SELECTIVE EACH ADDITIONAL VESSEL  05/15/2023  . IR ANGIOGRAM SELECTIVE EACH ADDITIONAL VESSEL  05/15/2023  . IR ANGIOGRAM SELECTIVE EACH  ADDITIONAL VESSEL  05/15/2023  . IR ANGIOGRAM SELECTIVE EACH ADDITIONAL VESSEL  05/15/2023  . IR EMBO ARTERIAL NOT HEMORR HEMANG INC GUIDE ROADMAPPING  05/15/2023  . IR RADIOLOGIST EVAL & MGMT  04/16/2023  . IR US  GUIDE VASC ACCESS LEFT  05/15/2023  . JOINT REPLACEMENT     hip  . LUMBAR LAMINECTOMY     X 3  . neck fusion    . OVARIAN CYST SURGERY Left 1998  . PILONIDAL CYST EXCISION  1994  . RIGHT OOPHORECTOMY    . SHOULDER ARTHROSCOPY WITH ROTATOR CUFF REPAIR Right 10/17/2022   Procedure: Right shoulder arthroscopy, debridement, subacromial decompression, distal clavicle resection, rotator cuff repair;  Surgeon: Melita Drivers, MD;  Location: [facility];  Service: Orthopedics;  Laterality: Right;   . SPINE SURGERY    . STERIOD INJECTION Left 01/12/2016   Procedure: STEROID INJECTION LEFT WRIST;  Surgeon: Elsie Mussel, MD;  Location: [facility];  Service: Orthopedics;  Laterality: Left;  . TOTAL HIP ARTHROPLASTY Left 03/19/2019   Procedure: LEFT TOTAL HIP ARTHROPLASTY ANTERIOR APPROACH;  Surgeon: Vernetta Lonni GRADE, MD;  Location: [facility];  Service: Orthopedics;  Laterality: Left;  . TOTAL THYROIDECTOMY  2003  . UPPER GI ENDOSCOPY  01/19/2015    Family History  Problem Relation Age of Onset  . Heart disease Father   . Depression Father   . Heart attack Father   . Sudden death Father   . Anxiety disorder Father   . Bipolar disorder Father   . Obesity Father   . Cancer Brother        THROAT cancer  . Depression Brother   . Anxiety disorder Brother   . Depression Sister   .  Breast cancer Mother   . Hypertension Mother   . Cancer Mother   . Depression Mother   . Sleep apnea Mother   . Obesity Mother   . Varicose Veins Mother   . Depression Maternal Grandmother   . Stroke Maternal Grandfather   . Colon cancer Brother   . Stroke Paternal Grandmother   . Anxiety disorder Sister   . Depression Sister   . Hearing loss Sister   . Diabetes Brother   .  Hearing loss Brother   . Obesity Brother   . Pancreatic cancer Neg Hx   . Stomach cancer Neg Hx   . Liver disease Neg Hx     Review of Systems  Constitutional:  Negative for chills, fatigue and fever.  HENT:  Negative for congestion, ear pain, sinus pressure and sore throat.   Respiratory:  Negative for cough and shortness of breath.   Cardiovascular:  Negative for chest pain.  Gastrointestinal:  Negative for abdominal pain, constipation, diarrhea, nausea and vomiting.  Genitourinary:  Negative for dysuria and frequency.  Musculoskeletal:  Negative for arthralgias, back pain and myalgias.  Neurological:  Negative for dizziness and headaches.  Psychiatric/Behavioral:  Negative for dysphoric mood. The patient is not nervous/anxious.      Objective:  BP 120/74   Pulse 74   Temp 98.4 F (36.9 C)   Ht 5' 11 (1.803 m)   Wt 277 lb (125.6 kg)   LMP 08/24/2020   SpO2 97%   BMI 38.63 kg/m      03/30/2024    1:24 PM 03/03/2024    3:02 PM 02/11/2024    3:06 PM  BP/Weight  Systolic BP 120 120 108  Diastolic BP 74 70 69  Wt. (Lbs) 277 280 285  BMI 38.63 kg/m2 39.05 kg/m2 39.75 kg/m2    Physical Exam  Lab Results  Component Value Date   WBC 7.9 03/03/2024   HGB 15.9 03/03/2024   HCT 50.0 (H) 03/03/2024   PLT 190 03/03/2024   GLUCOSE 92 03/03/2024   CHOL 290 (H) 03/03/2024   TRIG 221 (H) 03/03/2024   HDL 45 03/03/2024   LDLCALC 202 (H) 03/03/2024   ALT 18 03/03/2024   AST 18 03/03/2024   NA 136 03/03/2024   K 4.2 03/03/2024   CL 98 03/03/2024   CREATININE 1.07 (H) 03/03/2024   BUN 13 03/03/2024   CO2 20 03/03/2024   TSH 6.620 (H) 03/03/2024   INR 1.0 05/15/2023   HGBA1C 5.5 03/03/2024   MICROALBUR 0.6 01/31/2017      Assessment & Plan:   Liver cirrhosis secondary to NASH (nonalcoholic steatohepatitis) (HCC) -     Amb Referral to Hepatology  Primary osteoarthritis of both knees -     traMADol  HCl; Take 1 tablet (50 mg total) by mouth daily as needed for  moderate pain (pain score 4-6).  Dispense: 30 tablet; Refill: 0  Other orders -     Mounjaro ; Inject 12.5 mg into the skin once a week.  Dispense: 6 mL; Refill: 2 -     Ondansetron ; Dissolve 1 tablet (4 mg total) by mouth 2 (two) times daily as needed for nausea or vomiting.  Dispense: 20 tablet; Refill: 1     Body mass index is 38.63 kg/m.   These are the goals we discussed:  Goals   None      This is a list of the screening recommended for you and due dates:  Health Maintenance  Topic Date Due  .  Yearly kidney health urinalysis for diabetes  03/27/2024  . Eye exam for diabetics  03/30/2024*  . COVID-19 Vaccine (7 - 2024-25 season) 04/15/2024*  . Flu Shot  11/02/2024*  . Hepatitis B Vaccine (1 of 3 - 19+ 3-dose series) 03/03/2025*  . HIV Screening  03/03/2025*  . Complete foot exam   09/03/2024  . Hemoglobin A1C  09/03/2024  . Yearly kidney function blood test for diabetes  03/03/2025  . Mammogram  04/08/2025  . DTaP/Tdap/Td vaccine (2 - Td or Tdap) 01/03/2028  . Pap with HPV screening  03/05/2028  . Colon Cancer Screening  01/11/2031  . Pneumococcal Vaccine for age over 77  Completed  . Hepatitis C Screening  Completed  . Zoster (Shingles) Vaccine  Completed  . HPV Vaccine  Aged Out  . Meningitis B Vaccine  Aged Out  *Topic was postponed. The date shown is not the original due date.     Meds ordered this encounter  Medications  . traMADol  (ULTRAM ) 50 MG tablet    Sig: Take 1 tablet (50 mg total) by mouth daily as needed for moderate pain (pain score 4-6).    Dispense:  30 tablet    Refill:  0    Continuing prescription, 1 month supply  . tirzepatide  (MOUNJARO ) 12.5 MG/0.5ML Pen    Sig: Inject 12.5 mg into the skin once a week.    Dispense:  6 mL    Refill:  2  . ondansetron  (ZOFRAN -ODT) 4 MG disintegrating tablet    Sig: Dissolve 1 tablet (4 mg total) by mouth 2 (two) times daily as needed for nausea or vomiting.    Dispense:  20 tablet    Refill:  1     Follow-up: No follow-ups on file.  An After Visit Summary was printed and given to the patient.  Gia Lusher, MD [facility] [phone]

## 2024-03-31 ENCOUNTER — Other Ambulatory Visit (HOSPITAL_BASED_OUTPATIENT_CLINIC_OR_DEPARTMENT_OTHER): Payer: Self-pay

## 2024-03-31 ENCOUNTER — Other Ambulatory Visit (HOSPITAL_COMMUNITY): Payer: Self-pay

## 2024-03-31 ENCOUNTER — Ambulatory Visit (INDEPENDENT_AMBULATORY_CARE_PROVIDER_SITE_OTHER)
Admission: RE | Admit: 2024-03-31 | Discharge: 2024-03-31 | Disposition: A | Discharge status: Home / Self Care | Source: Ambulatory Visit

## 2024-03-31 DIAGNOSIS — K7460 Unspecified cirrhosis of liver: Secondary | ICD-10-CM

## 2024-03-31 DIAGNOSIS — K7581 Nonalcoholic steatohepatitis (NASH): Secondary | ICD-10-CM

## 2024-03-31 DIAGNOSIS — K7689 Other specified diseases of liver: Secondary | ICD-10-CM | POA: Diagnosis not present

## 2024-04-01 NOTE — Assessment & Plan Note (Signed)
 Chronic liver cirrhosis with new onset right lateral abdominal pain, worsening over the past month. Pain is intermittent, sometimes severe, and not associated with right upper quadrant tenderness. Differential includes musculoskeletal pain. Concerns about liver function and potential complications due to cirrhosis. - Order liver ultrasound at [facility] referral to [facility] - Prescribe tramadol  30 tablets for severe pain, advise caution due to potential sedation and liver function impact

## 2024-04-01 NOTE — Assessment & Plan Note (Signed)
 Chronic oral lichen planus managed with CellCept  and tacrolimus . Experiencing nausea, likely related to CellCept . Monitoring for infections due to immunosuppressive therapy. - Refill Zofran  for nausea management - Continue CellCept  and tacrolimus  as prescribed - Monitor for infections

## 2024-04-01 NOTE — Assessment & Plan Note (Signed)
 Routine adult wellness visit. Up to date with dental exams and mammogram. Needs diabetic eye exam. Discussed importance of flu vaccination, especially due to CellCept  use. - Ensure diabetic eye exam is scheduled - Advise flu vaccination with high-dose vaccine when available Physical exam performed today Return for wellness exam in 1 year

## 2024-04-02 DIAGNOSIS — M1712 Unilateral primary osteoarthritis, left knee: Secondary | ICD-10-CM | POA: Diagnosis not present

## 2024-04-06 ENCOUNTER — Encounter: Payer: Self-pay | Admitting: Sports Medicine

## 2024-04-06 ENCOUNTER — Other Ambulatory Visit: Payer: Self-pay | Admitting: Family Medicine

## 2024-04-06 ENCOUNTER — Ambulatory Visit: Payer: Self-pay

## 2024-04-07 ENCOUNTER — Other Ambulatory Visit: Payer: Self-pay

## 2024-04-07 ENCOUNTER — Other Ambulatory Visit (HOSPITAL_COMMUNITY): Payer: Self-pay

## 2024-04-07 MED ORDER — SPIRONOLACTONE 50 MG PO TABS
50.0000 mg | ORAL_TABLET | Freq: Two times a day (BID) | ORAL | 2 refills | Status: AC
  Filled 2024-04-07: qty 180, 90d supply, fill #0
  Filled 2024-07-01: qty 180, 90d supply, fill #1

## 2024-04-07 MED ORDER — PANTOPRAZOLE SODIUM 40 MG PO TBEC
80.0000 mg | DELAYED_RELEASE_TABLET | Freq: Two times a day (BID) | ORAL | 3 refills | Status: DC
Start: 2024-04-07 — End: 2024-05-24
  Filled 2024-04-07: qty 60, 15d supply, fill #0
  Filled 2024-04-22 – 2024-05-24 (×2): qty 60, 15d supply, fill #1

## 2024-04-08 ENCOUNTER — Telehealth

## 2024-04-08 ENCOUNTER — Ambulatory Visit: Payer: Self-pay

## 2024-04-08 NOTE — Telephone Encounter (Signed)
 FYI Only or Action Required?: FYI only for provider.  Patient was last seen in primary care on 03/30/2024 by Sirivol, Mamatha, MD.  Called Nurse Triage reporting covid 19 .  Symptoms began today.  Interventions attempted: Rest, hydration, or home remedies.  Symptoms are: gradually worsening.  Triage Disposition: See HCP Within 4 Hours (Or PCP Triage) (overriding Call PCP Within 24 Hours)  Patient/caregiver understands and will follow disposition?:   Copied from CRM [id number]. Topic: Clinical - Red Word Triage >> Apr 08, 2024 12:26 PM [place] MATSU wrote: Red Word that prompted transfer to Nurse Triage: cough, runny nose, pain headache/ sinus pain, fatigue,   Test postive for covid-19 today. Reason for Disposition  [1] HIGH RISK patient AND [2] influenza exposure within the last 7 days AND [3] ONE OR MORE respiratory symptoms: cough, sore throat, runny or stuffy nose  Answer Assessment - Initial Assessment Questions Additional info: Scheduled virtual urgent care per patient request.   1. COVID-19 DIAGNOSIS: How do you know that you have COVID? (e.g., positive lab test or self-test, diagnosed by doctor or NP/PA, symptoms after exposure).     Home test  2. COVID-19 EXPOSURE: Was there any known exposure to COVID before the symptoms began? CDC Definition of close contact: within 6 feet (2 meters) for a total of 15 minutes or more over a 24-hour period.      Yes-3 days ago 3. ONSET: When did the COVID-19 symptoms start?      today 4. WORST SYMPTOM: What is your worst symptom? (e.g., cough, fever, shortness of breath, muscle aches)     Sinus, headache 5. COUGH: Do you have a cough? If Yes, ask: How bad is the cough?       yes 6. FEVER: Do you have a fever? If Yes, ask: What is your temperature, how was it measured, and when did it start?     denies 7. RESPIRATORY STATUS: Describe your breathing? (e.g., normal; shortness of breath, wheezing, unable to speak)      Speaking  in full sentences  8. BETTER-SAME-WORSE: Are you getting better, staying the same or getting worse compared to yesterday?  If getting worse, ask, In what way?     Started today 9. OTHER SYMPTOMS: Do you have any other symptoms?  (e.g., chills, fatigue, headache, loss of smell or taste, muscle pain, sore throat)     Body aches 10. HIGH RISK DISEASE: Do you have any chronic medical problems? (e.g., asthma, heart or lung disease, weak immune system, obesity, etc.)       Yes. On cellcept  11. VACCINE: Have you had the COVID-19 vaccine? If Yes, ask: Which one, how many shots, when did you get it?        12. PREGNANCY: Is there any chance you are pregnant? When was your last menstrual period?        13. O2 SATURATION MONITOR:  Do you use an oxygen  saturation monitor (pulse oximeter) at home? If Yes, ask What is your reading (oxygen  level) today? What is your usual oxygen  saturation reading? (e.g., 95%)  Protocols used: Coronavirus (COVID-19) Diagnosed or Suspected-A-AH

## 2024-04-09 ENCOUNTER — Other Ambulatory Visit (HOSPITAL_BASED_OUTPATIENT_CLINIC_OR_DEPARTMENT_OTHER): Payer: Self-pay

## 2024-04-09 ENCOUNTER — Other Ambulatory Visit: Payer: Self-pay

## 2024-04-09 ENCOUNTER — Telehealth: Payer: Self-pay | Admitting: *Deleted

## 2024-04-09 ENCOUNTER — Other Ambulatory Visit (HOSPITAL_COMMUNITY): Payer: Self-pay

## 2024-04-09 MED ORDER — BENZONATATE 200 MG PO CAPS
200.0000 mg | ORAL_CAPSULE | Freq: Three times a day (TID) | ORAL | 0 refills | Status: DC | PRN
  Filled 2024-04-09: qty 21, 7d supply, fill #0

## 2024-04-09 NOTE — Telephone Encounter (Signed)
 Patient will call back to schedule annual.

## 2024-04-10 ENCOUNTER — Encounter: Payer: Self-pay | Admitting: Nurse Practitioner

## 2024-04-10 ENCOUNTER — Telehealth: Admitting: Nurse Practitioner

## 2024-04-10 DIAGNOSIS — U071 COVID-19: Secondary | ICD-10-CM

## 2024-04-10 MED ORDER — ALBUTEROL SULFATE HFA 108 (90 BASE) MCG/ACT IN AERS
2.0000 | INHALATION_SPRAY | Freq: Four times a day (QID) | RESPIRATORY_TRACT | 2 refills | Status: AC | PRN
  Filled 2024-07-22 – 2024-08-13 (×2): qty 6.7, 25d supply, fill #0

## 2024-04-10 NOTE — Patient Instructions (Signed)
 Tracy [name], thank you for joining Haze LELON Servant, NP for today's virtual visit.  While this provider is not your primary care provider (PCP), if your PCP is located in our provider database this encounter information will be shared with them immediately following your visit.   A [facility] MyChart account gives you access to today's visit and all your visits, tests, and labs performed at [facility]  click here if you don't have a [facility] MyChart account or go to [email or web address]  Consent: (Patient) Tracy [name] provided verbal consent for this virtual visit at the beginning of the encounter.  Current Medications:  Current Outpatient Medications:    albuterol  (VENTOLIN  HFA) 108 (90 Base) MCG/ACT inhaler, Inhale 2 puffs into the lungs every 6 (six) hours as needed for wheezing or shortness of breath., Disp: 8 g, Rfl: 2   AMBULATORY NON FORMULARY MEDICATION, Rollator for use daily. Elevated toilet seat with handlebars, Disp: 1 each, Rfl: 0   Armodafinil  200 MG TABS, Take 1 tablet (200 mg total) by mouth daily as needed (sleepiness)., Disp: 30 tablet, Rfl: 2   Atogepant  (QULIPTA ) 60 MG TABS, Take 1 tablet (60 mg total) by mouth daily., Disp: 30 tablet, Rfl: 11   benzonatate  (TESSALON ) 200 MG capsule, Take 1 capsule (200 mg total) by mouth 3 (three) times daily as needed for up to 7 days., Disp: 21 capsule, Rfl: 0   Blood Glucose Monitoring Suppl (FREESTYLE LITE) w/Device KIT, Use as directed., Disp: 1 kit, Rfl: 0   Blood Glucose Monitoring Suppl (FREESTYLE LITE) w/Device KIT, Check fasting blood sugar every morning and before meals up to 4 times daily., Disp: 1 kit, Rfl: PRN   botulinum toxin Type A  (BOTOX ) 200 units injection, Inject 200 Units into the muscle every 3 (three) months. (Patient not taking: Reported on 03/30/2024), Disp: 1 each, Rfl: 2   clobetasol  ointment (TEMOVATE ) 0.05 %, Apply topically 2 (two) times daily., Disp: 60 g, Rfl: 6   clonazePAM   (KLONOPIN ) 1 MG tablet, Take 1 tablet (1 mg total) by mouth 3 (three) times daily., Disp: 90 tablet, Rfl: 2   Continuous Glucose Sensor (DEXCOM G7 SENSOR) MISC, Use to check glucose throughout the day.  Change every 10 days, Disp: 9 each, Rfl: 1   Continuous Glucose Transmitter (DEXCOM G6 TRANSMITTER) MISC, Use to check continuous blood sugar.  Replace every 90 days, Disp: 1 each, Rfl: 3   cyclobenzaprine  (FLEXERIL ) 10 MG tablet, Take 1 tablet (10 mg total) by mouth 3 (three) times daily as needed for muscle spasms., Disp: 30 tablet, Rfl: 1   furosemide  (LASIX ) 40 MG tablet, Take 1 tablet (40 mg total) by mouth daily., Disp: 90 tablet, Rfl: 0   glucose blood (FREESTYLE LITE) test strip, Use as instructed, Disp: 100 each, Rfl: 12   glucose blood (FREESTYLE LITE) test strip, Check fasting blood sugar every morning and before meals up to 4 times a day as directed., Disp: 100 strip, Rfl: 12   hydrOXYzine  (ATARAX ) 25 MG tablet, Take 1 tablet (25 mg total) by mouth 3 (three) times daily as needed for anxiety., Disp: 270 tablet, Rfl: 1   Insulin  Pen Needle (UNIFINE PENTIPS) 31G X 5 MM MISC, Use as directed to inject insulin , Disp: 100 each, Rfl: 3   lamoTRIgine  (LAMICTAL ) 200 MG tablet, Take 2 tablets (400 mg total) by mouth at bedtime., Disp: , Rfl:    Lancets 30G MISC, Check fasting blood sugar every morning and before meals up to 4 times  daily., Disp: 100 each, Rfl: 12   lidocaine  (LIDODERM ) 5 %, Place 1 patch onto the skin every 12 (twelve) hours. Remove & Discard patch within 12 hours or as directed by MD, Disp: 30 patch, Rfl: 0   lidocaine  (XYLOCAINE ) 5 % ointment, Apply topically to affected areas 3 times daily as needed., Disp: 35.44 g, Rfl: 0   lisinopril  (ZESTRIL ) 5 MG tablet, Take 1 tablet (5 mg total) by mouth daily., Disp: 90 tablet, Rfl: 2   meloxicam  (MOBIC ) 15 MG tablet, Take 1 tablet by mouth once every morning with a meal for 2 weeks, then daily as needed for pain., Disp: 30 tablet, Rfl: 0    Menthol , Topical Analgesic, (BENGAY EX), Apply 1 Application topically daily as needed (pain)., Disp: , Rfl:    mycophenolate  (CELLCEPT ) 500 MG tablet, Take 2 tablets (1,000mg ) twice per day; if you are tolerating it well after 2 weeks and labs look good increase to 3 tablets (1,500mg ) twice per day, Disp: 120 tablet, Rfl: 2   neomycin-bacitracin -polymyxin [phone] OINT, Apply topically 2 (two) times daily for 10 days To corners of mouth (Patient not taking: Reported on 03/30/2024), Disp: 14 g, Rfl: 2   nystatin  (NYAMYC ) powder, Apply 3 times daily as directed., Disp: 60 g, Rfl: 3   nystatin  ointment (MYCOSTATIN ), Apply to the skin 2 times daily to corners of mouth, Disp: 15 g, Rfl: 2   ondansetron  (ZOFRAN -ODT) 4 MG disintegrating tablet, Dissolve 1 tablet (4 mg total) by mouth 2 (two) times daily as needed for nausea or vomiting., Disp: 20 tablet, Rfl: 1   pantoprazole  (PROTONIX ) 40 MG tablet, Take 2 tablets (80 mg total) by mouth 2 (two) times daily., Disp: 60 tablet, Rfl: 3   pimecrolimus  (ELIDEL ) 1 % cream, Apply a small amount to affected area 2 x daily. Gently pat area dry w/cotton gauze. No eating, drinking or speaking for 20 - 30 minutes to allow medicine to be absorbed. Can apply to 2x2 gauze & placed against the affected area., Disp: 60 g, Rfl: 3   pregabalin  (LYRICA ) 75 MG capsule, Take 1 capsule (75 mg total) by mouth 3 (three) times daily., Disp: 90 capsule, Rfl: 3   risperiDONE  (RISPERDAL ) 2 MG tablet, Take 1 tablet (2 mg total) by mouth at bedtime., Disp: 90 tablet, Rfl: 1   rosuvastatin  (CRESTOR ) 20 MG tablet, Take 1 tablet (20 mg total) by mouth daily., Disp: 90 tablet, Rfl: 3   sertraline  (ZOLOFT ) 100 MG tablet, Take 2 tablets (200 mg total) by mouth every morning., Disp: 180 tablet, Rfl: 3   spironolactone  (ALDACTONE ) 50 MG tablet, Take 1 tablet (50 mg total) by mouth 2 (two) times daily., Disp: 180 tablet, Rfl: 2   SUMAtriptan  (IMITREX ) 100 MG tablet, Take 1 tablet (100 mg total)  by mouth every 2 (two) hours as needed for migraine. May repeat in 2 hours if headache persists or recurs., Disp: 10 tablet, Rfl: 11   SYNTHROID  200 MCG tablet, Take 1 tablet (200 mcg total) by mouth daily before breakfast., Disp: 90 tablet, Rfl: 1   tacrolimus  (PROGRAF ) 1 MG capsule, Open two capsules and dissolve in (0.5 liters) of water . Label well and keep in fridge. Swish and spit 10mL twice a day., Disp: 30 capsule, Rfl: 2   tirzepatide  (MOUNJARO ) 12.5 MG/0.5ML Pen, Inject 12.5 mg into the skin once a week., Disp: 6 mL, Rfl: 2   tiZANidine  (ZANAFLEX ) 4 MG tablet, Take 1 tablet (4 mg total) by mouth every 6 (six) hours  as needed., Disp: 30 tablet, Rfl: 6   traMADol  (ULTRAM ) 50 MG tablet, Take 1 tablet (50 mg total) by mouth daily as needed for moderate pain (pain score 4-6)., Disp: 30 tablet, Rfl: 0   traZODone  (DESYREL ) 100 MG tablet, Take 1 tablet (100 mg total) by mouth at bedtime., Disp: , Rfl:   Current Facility-Administered Medications:    botulinum toxin Type A  (BOTOX ) injection 155 Units, 155 Units, Intramuscular, Once, Onita Duos, MD   Medications ordered in this encounter:  Meds ordered this encounter  Medications   albuterol  (VENTOLIN  HFA) 108 (90 Base) MCG/ACT inhaler    Sig: Inhale 2 puffs into the lungs every 6 (six) hours as needed for wheezing or shortness of breath.    Dispense:  8 g    Refill:  2    Supervising Provider:   BLAISE ALEENE KIDD [[id number]]     *If you need refills on other medications prior to your next appointment, please contact your pharmacy*  Follow-Up: Call back or seek an in-person evaluation if the symptoms worsen or if the condition fails to improve as anticipated.  [facility] [phone]  Other Instructions  Please keep well-hydrated and get plenty of rest. Start a saline nasal rinse to flush out your nasal passages. You can use plain Mucinex to help thin congestion. If you have a humidifier, you can use this daily as  needed.    You are to wear a mask for 5 days from onset of your symptoms.  After day 5, if you have had no fever and you are feeling better with NO symptoms, you can end masking. Keep in mind you can be contagious 10 days from the onset of symptoms  After day 5 if you have a fever or are having significant symptoms, please wear your mask for full 10 days.   If you note any worsening of symptoms, any significant shortness of breath or any chest pain, please seek ER evaluation ASAP.  Please do not delay care!    If you note any worsening of symptoms, any significant shortness of breath or any chest pain, please seek ER evaluation ASAP.  Please do not delay care!    If you have been instructed to have an in-person evaluation today at a local Urgent Care facility, please use the link below. It will take you to a list of all of our available [facility], including address, phone number and hours of operation. Please do not delay care.  [facility]  If you or a family member do not have a primary care provider, use the link below to schedule a visit and establish care. When you choose a [facility] primary care physician or advanced practice provider, you gain a long-term partner in health. Find a Primary Care Provider  Learn more about 's in-office and virtual care options:  - Get Care Now

## 2024-04-10 NOTE — Progress Notes (Signed)
 Virtual Visit Consent   Tracy [name], you are scheduled for a virtual visit with a [facility] provider today. Just as with appointments in the office, your consent must be obtained to participate. Your consent will be active for this visit and any virtual visit you may have with one of our providers in the next 365 days. If you have a MyChart account, a copy of this consent can be sent to you electronically.  As this is a virtual visit, video technology does not allow for your provider to perform a traditional examination. This may limit your provider's ability to fully assess your condition. If your provider identifies any concerns that need to be evaluated in person or the need to arrange testing (such as labs, EKG, etc.), we will make arrangements to do so. Although advances in technology are sophisticated, we cannot ensure that it will always work on either your end or our end. If the connection with a video visit is poor, the visit may have to be switched to a telephone visit. With either a video or telephone visit, we are not always able to ensure that we have a secure connection.  By engaging in this virtual visit, you consent to the provision of healthcare and authorize for your insurance to be billed (if applicable) for the services provided during this visit. Depending on your insurance coverage, you may receive a charge related to this service.  I need to obtain your verbal consent now. Are you willing to proceed with your visit today? Tracy [name] has provided verbal consent on 04/10/2024 for a virtual visit (video or telephone). Tracy LELON Servant, NP  Date: 04/10/2024 11:42 AM   Virtual Visit via Video Note   I, Tracy [name], connected with  Tracy [name]  ([id number], August 29, 1965) on 04/10/24 at 11:30 AM EDT by a video-enabled telemedicine application and verified that I am speaking with the correct person using two identifiers.  Location: Patient: Virtual Visit Location Patient:  Home Provider: Virtual Visit Location Provider: Home Office   I discussed the limitations of evaluation and management by telemedicine and the availability of in person appointments. The patient expressed understanding and agreed to proceed.    History of Present Illness: Tracy [name] is a 58 y.o. who identifies as a female who was assigned female at birth, and is being seen today for COVID positive symptoms.  Tracy [name] Tested positive for COVID 2 days ago. She is currently experiencing fever, decreased appetite, nasal congestion, shortness of breath, wheezing and productive cough with yellow sputum. She has a smoking tobacco history.  Currently taking at home. She is not taking a covid antiviral at this time.   Problems:  Patient Active Problem List   Diagnosis Date Noted   Well adult exam 03/30/2024   Left arm swelling 02/11/2024   Gait abnormality 12/25/2023   Fibromyalgia 09/07/2023   Torsion of right fallopian tube 09/04/2023   Trigger thumb, left thumb 05/08/2023   Encounter for hepatitis C screening test for low risk patient 03/28/2023   Boil 03/28/2023   Osteoarthritis of thumb, left 03/22/2023   Chronic migraine w/o aura w/o status migrainosus, not intractable 03/20/2023   Morbid obesity (HCC) 03/20/2023   Primary osteoarthritis of both knees 01/10/2023   S/P right rotator cuff repair 10/21/2022   Non-recurrent acute suppurative otitis media of left ear without spontaneous rupture of tympanic membrane 08/22/2022   Localized, primary osteoarthritis of the ankle and foot, left 07/31/2022   Constipation  08/09/2021   Dental crowns present 08/09/2021   Diabetes mellitus without complication (HCC) 08/09/2021   Food allergy 08/09/2021   History of kidney stones 08/09/2021   History of migraine headaches 08/09/2021   Hypothyroidism 08/09/2021   IBS (irritable bowel syndrome) 08/09/2021   Lichen planus 08/09/2021   Liver cirrhosis secondary to NASH (nonalcoholic  steatohepatitis) (HCC) 08/09/2021   NASH (nonalcoholic steatohepatitis) 08/09/2021   Other fatigue 08/09/2021   PONV (postoperative nausea and vomiting) 08/09/2021   Coronary artery disease 03/21/2021   Gastroesophageal reflux disease    Gastritis and gastroduodenitis    Diverticulosis of colon without hemorrhage    Grade II internal hemorrhoids    Morbid obesity with BMI of 40.0-44.9, adult (HCC) 06/23/2020   Chronic left shoulder pain 05/17/2020   Oral mucosal lesion 06/17/2019   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   Vitamin D  deficiency 01/04/2018   Lower back pain 07/18/2017   Hemorrhoids, internal 07/18/2017   Trigeminal neuralgia 01/18/2016   Bipolar disorder (HCC) 11/28/2015   Borderline personality disorder (HCC) 11/28/2015   Carpal tunnel syndrome 09/06/2015   MDD (major depressive disorder) 06/06/2015   Severe episode of recurrent major depressive disorder, without psychotic features (HCC)    History of posttraumatic stress disorder (PTSD)    GAD (generalized anxiety disorder)    Female hirsutism 02/09/2015   Anxiety and depression 10/31/2014   Obstructive sleep apnea 06/27/2014   Diabetes mellitus, type 2 (HCC) 06/10/2013   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   Cystic teratoma    PCOS (polycystic ovarian syndrome)    TOBACCO USE, QUIT 02/08/2010   HYPOTHYROIDISM, POSTSURGICAL 10/02/2007   Migraine 10/02/2007   Essential hypertension 10/02/2007   RENAL CALCULUS, HX OF 10/02/2007   History of appendectomy 08/18/1982    Allergies:  Allergies  Allergen Reactions   Sulfa Antibiotics Itching, Swelling and Rash   Food     Walnut-mouth swelling/itching   Tilactase Swelling    Walnut-mouth swelling/itching   Bupropion Hcl Itching and Rash   Codeine Itching   Medications:  Current Outpatient Medications:    albuterol  (VENTOLIN  HFA) 108 (90 Base) MCG/ACT inhaler, Inhale 2 puffs into the lungs every 6 (six) hours as needed for wheezing or  shortness of breath., Disp: 8 g, Rfl: 2   AMBULATORY NON FORMULARY MEDICATION, Rollator for use daily. Elevated toilet seat with handlebars, Disp: 1 each, Rfl: 0   Armodafinil  200 MG TABS, Take 1 tablet (200 mg total) by mouth daily as needed (sleepiness)., Disp: 30 tablet, Rfl: 2   Atogepant  (QULIPTA ) 60 MG TABS, Take 1 tablet (60 mg total) by mouth daily., Disp: 30 tablet, Rfl: 11   benzonatate  (TESSALON ) 200 MG capsule, Take 1 capsule (200 mg total) by mouth 3 (three) times daily as needed for up to 7 days., Disp: 21 capsule, Rfl: 0   Blood Glucose Monitoring Suppl (FREESTYLE LITE) w/Device KIT, Use as directed., Disp: 1 kit, Rfl: 0   Blood Glucose Monitoring Suppl (FREESTYLE LITE) w/Device KIT, Check fasting blood sugar every morning and before meals up to 4 times daily., Disp: 1 kit, Rfl: PRN   botulinum toxin Type A  (BOTOX ) 200 units injection, Inject 200 Units into the muscle every 3 (three) months. (Patient not taking: Reported on 03/30/2024), Disp: 1 each, Rfl: 2   clobetasol  ointment (TEMOVATE ) 0.05 %, Apply topically 2 (two) times daily., Disp: 60 g, Rfl: 6   clonazePAM  (KLONOPIN ) 1 MG tablet, Take 1 tablet (1 mg total) by mouth 3 (three) times daily.,  Disp: 90 tablet, Rfl: 2   Continuous Glucose Sensor (DEXCOM G7 SENSOR) MISC, Use to check glucose throughout the day.  Change every 10 days, Disp: 9 each, Rfl: 1   Continuous Glucose Transmitter (DEXCOM G6 TRANSMITTER) MISC, Use to check continuous blood sugar.  Replace every 90 days, Disp: 1 each, Rfl: 3   cyclobenzaprine  (FLEXERIL ) 10 MG tablet, Take 1 tablet (10 mg total) by mouth 3 (three) times daily as needed for muscle spasms., Disp: 30 tablet, Rfl: 1   furosemide  (LASIX ) 40 MG tablet, Take 1 tablet (40 mg total) by mouth daily., Disp: 90 tablet, Rfl: 0   glucose blood (FREESTYLE LITE) test strip, Use as instructed, Disp: 100 each, Rfl: 12   glucose blood (FREESTYLE LITE) test strip, Check fasting blood sugar every morning and before  meals up to 4 times a day as directed., Disp: 100 strip, Rfl: 12   hydrOXYzine  (ATARAX ) 25 MG tablet, Take 1 tablet (25 mg total) by mouth 3 (three) times daily as needed for anxiety., Disp: 270 tablet, Rfl: 1   Insulin  Pen Needle (UNIFINE PENTIPS) 31G X 5 MM MISC, Use as directed to inject insulin , Disp: 100 each, Rfl: 3   lamoTRIgine  (LAMICTAL ) 200 MG tablet, Take 2 tablets (400 mg total) by mouth at bedtime., Disp: , Rfl:    Lancets 30G MISC, Check fasting blood sugar every morning and before meals up to 4 times daily., Disp: 100 each, Rfl: 12   lidocaine  (LIDODERM ) 5 %, Place 1 patch onto the skin every 12 (twelve) hours. Remove & Discard patch within 12 hours or as directed by MD, Disp: 30 patch, Rfl: 0   lidocaine  (XYLOCAINE ) 5 % ointment, Apply topically to affected areas 3 times daily as needed., Disp: 35.44 g, Rfl: 0   lisinopril  (ZESTRIL ) 5 MG tablet, Take 1 tablet (5 mg total) by mouth daily., Disp: 90 tablet, Rfl: 2   meloxicam  (MOBIC ) 15 MG tablet, Take 1 tablet by mouth once every morning with a meal for 2 weeks, then daily as needed for pain., Disp: 30 tablet, Rfl: 0   Menthol , Topical Analgesic, (BENGAY EX), Apply 1 Application topically daily as needed (pain)., Disp: , Rfl:    mycophenolate  (CELLCEPT ) 500 MG tablet, Take 2 tablets (1,000mg ) twice per day; if you are tolerating it well after 2 weeks and labs look good increase to 3 tablets (1,500mg ) twice per day, Disp: 120 tablet, Rfl: 2   neomycin-bacitracin -polymyxin [phone] OINT, Apply topically 2 (two) times daily for 10 days To corners of mouth (Patient not taking: Reported on 03/30/2024), Disp: 14 g, Rfl: 2   nystatin  (NYAMYC ) powder, Apply 3 times daily as directed., Disp: 60 g, Rfl: 3   nystatin  ointment (MYCOSTATIN ), Apply to the skin 2 times daily to corners of mouth, Disp: 15 g, Rfl: 2   ondansetron  (ZOFRAN -ODT) 4 MG disintegrating tablet, Dissolve 1 tablet (4 mg total) by mouth 2 (two) times daily as needed for nausea  or vomiting., Disp: 20 tablet, Rfl: 1   pantoprazole  (PROTONIX ) 40 MG tablet, Take 2 tablets (80 mg total) by mouth 2 (two) times daily., Disp: 60 tablet, Rfl: 3   pimecrolimus  (ELIDEL ) 1 % cream, Apply a small amount to affected area 2 x daily. Gently pat area dry w/cotton gauze. No eating, drinking or speaking for 20 - 30 minutes to allow medicine to be absorbed. Can apply to 2x2 gauze & placed against the affected area., Disp: 60 g, Rfl: 3   pregabalin  (LYRICA ) 75 MG capsule,  Take 1 capsule (75 mg total) by mouth 3 (three) times daily., Disp: 90 capsule, Rfl: 3   risperiDONE  (RISPERDAL ) 2 MG tablet, Take 1 tablet (2 mg total) by mouth at bedtime., Disp: 90 tablet, Rfl: 1   rosuvastatin  (CRESTOR ) 20 MG tablet, Take 1 tablet (20 mg total) by mouth daily., Disp: 90 tablet, Rfl: 3   sertraline  (ZOLOFT ) 100 MG tablet, Take 2 tablets (200 mg total) by mouth every morning., Disp: 180 tablet, Rfl: 3   spironolactone  (ALDACTONE ) 50 MG tablet, Take 1 tablet (50 mg total) by mouth 2 (two) times daily., Disp: 180 tablet, Rfl: 2   SUMAtriptan  (IMITREX ) 100 MG tablet, Take 1 tablet (100 mg total) by mouth every 2 (two) hours as needed for migraine. May repeat in 2 hours if headache persists or recurs., Disp: 10 tablet, Rfl: 11   SYNTHROID  200 MCG tablet, Take 1 tablet (200 mcg total) by mouth daily before breakfast., Disp: 90 tablet, Rfl: 1   tacrolimus  (PROGRAF ) 1 MG capsule, Open two capsules and dissolve in (0.5 liters) of water . Label well and keep in fridge. Swish and spit 10mL twice a day., Disp: 30 capsule, Rfl: 2   tirzepatide  (MOUNJARO ) 12.5 MG/0.5ML Pen, Inject 12.5 mg into the skin once a week., Disp: 6 mL, Rfl: 2   tiZANidine  (ZANAFLEX ) 4 MG tablet, Take 1 tablet (4 mg total) by mouth every 6 (six) hours as needed., Disp: 30 tablet, Rfl: 6   traMADol  (ULTRAM ) 50 MG tablet, Take 1 tablet (50 mg total) by mouth daily as needed for moderate pain (pain score 4-6)., Disp: 30 tablet, Rfl: 0    traZODone  (DESYREL ) 100 MG tablet, Take 1 tablet (100 mg total) by mouth at bedtime., Disp: , Rfl:   Current Facility-Administered Medications:    botulinum toxin Type A  (BOTOX ) injection 155 Units, 155 Units, Intramuscular, Once, Onita Duos, MD  Observations/Objective: Patient is well-developed, well-nourished in no acute distress.  Resting comfortably at home.  Head is normocephalic, atraumatic.  No labored breathing.  Speech is clear and coherent with logical content.  Patient is alert and oriented at baseline.    Assessment and Plan: 1. Lab test positive for detection of COVID-19 virus (Primary) - albuterol  (VENTOLIN  HFA) 108 (90 Base) MCG/ACT inhaler; Inhale 2 puffs into the lungs every 6 (six) hours as needed for wheezing or shortness of breath.  Dispense: 8 g; Refill: 2   Please keep well-hydrated and get plenty of rest. Start a saline nasal rinse to flush out your nasal passages. You can use plain Mucinex to help thin congestion. If you have a humidifier, you can use this daily as needed.    You are to wear a mask for 5 days from onset of your symptoms.  After day 5, if you have had no fever and you are feeling better with NO symptoms, you can end masking. Keep in mind you can be contagious 10 days from the onset of symptoms  After day 5 if you have a fever or are having significant symptoms, please wear your mask for full 10 days.   If you note any worsening of symptoms, any significant shortness of breath or any chest pain, please seek ER evaluation ASAP.  Please do not delay care!    If you note any worsening of symptoms, any significant shortness of breath or any chest pain, please seek ER evaluation ASAP.  Please do not delay care!   Follow Up Instructions: I discussed the assessment and treatment plan  with the patient. The patient was provided an opportunity to ask questions and all were answered. The patient agreed with the plan and demonstrated an understanding of the  instructions.  A copy of instructions were sent to the patient via MyChart unless otherwise noted below.    The patient was advised to call back or seek an in-person evaluation if the symptoms worsen or if the condition fails to improve as anticipated.    Rexann Lueras W Joel Mericle, NP

## 2024-04-12 ENCOUNTER — Ambulatory Visit: Payer: Self-pay

## 2024-04-12 DIAGNOSIS — Z5321 Procedure and treatment not carried out due to patient leaving prior to being seen by health care provider: Secondary | ICD-10-CM | POA: Diagnosis not present

## 2024-04-12 DIAGNOSIS — M545 Low back pain, unspecified: Secondary | ICD-10-CM | POA: Diagnosis not present

## 2024-04-12 LAB — LAB REPORT - SCANNED: EGFR: 86

## 2024-04-12 NOTE — Telephone Encounter (Signed)
 FYI Only or Action Required?: FYI only for provider.  Patient was last seen in primary care on 04/10/2024 by Theotis Haze ORN, NP.  Called Nurse Triage reporting Advice Only.  Symptoms began information only.  Interventions attempted: Other: information only.  Symptoms are: information only.  Triage Disposition: Information or Advice Only Call  Patient/caregiver understands and will follow disposition?: Yes                             Copied from CRM #[id number]. Topic: Clinical - Red Word Triage >> Apr 12, 2024  4:14 PM Debby BROCKS wrote: Red Word that prompted transfer to Nurse Triage: Pain on both of her sides onto her back and legs. Reason for Disposition  General information question, no triage required and triager able to answer question  Answer Assessment - Initial Assessment Questions 1. REASON FOR CALL: What is the main reason for your call? or How can I best help you?     Patient was triaged earlier today for back and flank pain. Patient's friend, Verneita ([place]), is currently at the ER with patient. Patient is experiencing the same symptoms she was triaged for earlier today. Patient's friend stated they have been waiting at the ER for awhile and inquired if patient should stay at ER or go somewhere else. This RN advised patient/friend to stay at ER. Patient/friend verbalized understanding.  Protocols used: Information Only Call - No Triage-A-AH

## 2024-04-12 NOTE — Telephone Encounter (Signed)
 FYI Only or Action Required?: Action required by provider: request for appointment, update on patient condition, and will go to UC if no appt accommodations can be made.  Patient was last seen in primary care on 04/10/2024 by Theotis Haze ORN, NP.  Called Nurse Triage reporting Flank Pain and Pelvic Pain.  Symptoms began a week ago.  Interventions attempted: OTC medications: ibuprofen  and Prescription medications: muscle relaxants.  Symptoms are: gradually worsening.  Triage Disposition: See HCP Within 4 Hours (Or PCP Triage)  Patient/caregiver understands and will follow disposition?: Yes      Copied from CRM [id number]. Topic: Clinical - Red Word Triage >> Apr 12, 2024 12:53 PM Cleave MATSU wrote: Red Word that prompted transfer to Nurse Triage: pt is having severe left flank right flank and pelvic pain. Reason for Disposition  Pain or burning with passing urine (urination)  Answer Assessment - Initial Assessment Questions 1. LOCATION: Where does it hurt? (e.g., left, right)     Bilateral - started on R, then went to L side Initially thought muscle cramping, 2. ONSET: When did the pain start?     Last week 3. SEVERITY: How bad is the pain? (e.g., Scale 1-10; mild, moderate, or severe)     So bad that I break out in a sweat 4. PATTERN: Does the pain come and go, or is it constant?      constant 5. CAUSE: What do you think is causing the pain?     Almost feels like a kidney stone Worse with movement 6. OTHER SYMPTOMS:  Do you have any other symptoms? (e.g., fever, abdomen pain, vomiting, leg weakness, burning with urination, blood in urine)     Endorses COVID+ 7. PREGNANCY:  Is there any chance you are pregnant? When was your last menstrual period?     N/a    Triager attempted to schedule with PCP, but no access. Pt stated that she will go to [facility] after work at 1600.  Triager will forward encounter for PCP 's office to review and see if  accommodations can be made for AV appt today.  Protocols used: Flank Pain-A-AH

## 2024-04-13 ENCOUNTER — Other Ambulatory Visit (HOSPITAL_BASED_OUTPATIENT_CLINIC_OR_DEPARTMENT_OTHER): Payer: Self-pay

## 2024-04-13 ENCOUNTER — Other Ambulatory Visit: Payer: Self-pay

## 2024-04-13 ENCOUNTER — Ambulatory Visit

## 2024-04-13 ENCOUNTER — Other Ambulatory Visit (HOSPITAL_COMMUNITY): Payer: Self-pay

## 2024-04-13 VITALS — BP 110/60 | HR 65 | Temp 97.2°F | Resp 16 | Ht 71.0 in | Wt 274.0 lb

## 2024-04-13 DIAGNOSIS — E876 Hypokalemia: Secondary | ICD-10-CM | POA: Diagnosis not present

## 2024-04-13 DIAGNOSIS — F3181 Bipolar II disorder: Secondary | ICD-10-CM | POA: Diagnosis not present

## 2024-04-13 DIAGNOSIS — M5442 Lumbago with sciatica, left side: Secondary | ICD-10-CM

## 2024-04-13 MED ORDER — POTASSIUM CHLORIDE ER 10 MEQ PO CPCR
20.0000 meq | ORAL_CAPSULE | Freq: Every day | ORAL | 0 refills | Status: DC
  Filled 2024-04-13: qty 10, 5d supply, fill #0

## 2024-04-13 MED ORDER — METHYLPREDNISOLONE 4 MG PO TBPK
ORAL_TABLET | ORAL | 1 refills | Status: DC
  Filled 2024-04-13: qty 21, 6d supply, fill #0

## 2024-04-13 NOTE — Telephone Encounter (Signed)
 Called patient and schedule appointment today with provider.

## 2024-04-13 NOTE — Progress Notes (Signed)
 Subjective:  Patient ID: Tracy [name], female    DOB: 03/31/1966  Age: 58 y.o. MRN: [id number]  Chief Complaint  Patient presents with   Back Pain    HPI: Discussed the use of AI scribe software for clinical note transcription with the patient, who gave verbal consent to proceed.   History of Present Illness   Tracy [name] is a 58 year old female with a history of lumbar fusion who presents with acute low back pain.  Acute low back and buttock pain - Onset last week, prior to testing positive for COVID-19 on September 4th - Initial pain localized to a small area in the left buttock, now expanded and constant - Pain severity described as severe and reminiscent of kidney stone pain - Radiates to the left hip and sometimes wraps around, but does not extend to the legs - No tingling, numbness, or weakness in the legs - Difficulty sleeping due to pain - No recent imaging of the lumbar spine  History of lumbar fusion surgery - History of three lumbar fusion surgeries - No recent imaging performed  Analgesic and non-pharmacological treatment response - Tried arthritis-strength Tylenol , tramadol , Flexeril  10 mg, and meloxicam  15 mg without relief - Used cold compresses, heat, and lidocaine  patches without improvement  Emergency department evaluation - Visited ER due to pain severity yesterday - Blood and urine tests performed - Potassium level found to be low at 3 mEq/L  Psychological distress - Depression present - Crying due to work-related stress - Dissatisfaction with current job of 34 years and considering a new position            03/30/2024    1:33 PM 03/03/2024    3:06 PM 07/30/2022   11:29 AM 09/04/2021    1:08 PM 03/14/2021    2:57 PM  Depression screen PHQ 2/9  Decreased Interest 2 1 1  3   Down, Depressed, Hopeless 2 0 2  3  PHQ - 2 Score 4 1 3  6   Altered sleeping 3 3 0  2  Tired, decreased energy 3 2 2  3   Change in appetite 0 1 2  2   Feeling bad or  failure about yourself  0 0 3  3  Trouble concentrating 1 1 1  2   Moving slowly or fidgety/restless 0 0 0  1  Suicidal thoughts 0 0 0  0  PHQ-9 Score 11 8 11  19   Difficult doing work/chores Somewhat difficult Somewhat difficult Somewhat difficult  Extremely dIfficult     Information is confidential and restricted. Go to Review Flowsheets to unlock data.        03/30/2024    1:33 PM  Fall Risk   Falls in the past year? 1  Number falls in past yr: 1  Injury with Fall? 1  Risk for fall due to : History of fall(s)  Follow up Falls evaluation completed    Patient Care Team: Missy Baksh, MD as PCP - General (Family Medicine) Norval, Toribio CROME, MD (Inactive) (Obstetrics and Gynecology) Vincente Grip, MD as Attending Physician (Psychiatry) Darden Planas, NP as Nurse Practitioner Claudene Arthea HERO, DO as Consulting Physician (Family Medicine)   Review of Systems  Constitutional:  Negative for chills, fatigue and fever.  HENT:  Negative for congestion, ear pain and sore throat.   Respiratory:  Negative for cough and shortness of breath.   Cardiovascular:  Negative for chest pain and palpitations.  Gastrointestinal:  Negative for abdominal pain, constipation,  diarrhea, nausea and vomiting.  Endocrine: Negative for polydipsia, polyphagia and polyuria.  Genitourinary:  Negative for difficulty urinating and dysuria.  Musculoskeletal:  Positive for arthralgias (Left hip pain) and back pain. Negative for myalgias.  Skin:  Negative for rash.  Neurological:  Negative for headaches.  Psychiatric/Behavioral:  Negative for dysphoric mood. The patient is not nervous/anxious.     Current Outpatient Medications on File Prior to Visit  Medication Sig Dispense Refill   albuterol  (VENTOLIN  HFA) 108 (90 Base) MCG/ACT inhaler Inhale 2 puffs into the lungs every 6 (six) hours as needed for wheezing or shortness of breath. 8 g 2   AMBULATORY NON FORMULARY MEDICATION Rollator for use  daily. Elevated toilet seat with handlebars 1 each 0   Armodafinil  200 MG TABS Take 1 tablet (200 mg total) by mouth daily as needed (sleepiness). 30 tablet 2   Atogepant  (QULIPTA ) 60 MG TABS Take 1 tablet (60 mg total) by mouth daily. 30 tablet 11   Blood Glucose Monitoring Suppl (FREESTYLE LITE) w/Device KIT Use as directed. 1 kit 0   Blood Glucose Monitoring Suppl (FREESTYLE LITE) w/Device KIT Check fasting blood sugar every morning and before meals up to 4 times daily. 1 kit PRN   botulinum toxin Type A  (BOTOX ) 200 units injection Inject 200 Units into the muscle every 3 (three) months. 1 each 2   clobetasol  ointment (TEMOVATE ) 0.05 % Apply topically 2 (two) times daily. 60 g 6   clonazePAM  (KLONOPIN ) 1 MG tablet Take 1 tablet (1 mg total) by mouth 3 (three) times daily. 90 tablet 2   Continuous Glucose Sensor (DEXCOM G7 SENSOR) MISC Use to check glucose throughout the day.  Change every 10 days 9 each 1   cyclobenzaprine  (FLEXERIL ) 10 MG tablet Take 1 tablet (10 mg total) by mouth 3 (three) times daily as needed for muscle spasms. 30 tablet 1   furosemide  (LASIX ) 40 MG tablet Take 1 tablet (40 mg total) by mouth daily. 90 tablet 0   glucose blood (FREESTYLE LITE) test strip Use as instructed 100 each 12   glucose blood (FREESTYLE LITE) test strip Check fasting blood sugar every morning and before meals up to 4 times a day as directed. 100 strip 12   hydrOXYzine  (ATARAX ) 25 MG tablet Take 1 tablet (25 mg total) by mouth 3 (three) times daily as needed for anxiety. 270 tablet 1   Insulin  Pen Needle (UNIFINE PENTIPS) 31G X 5 MM MISC Use as directed to inject insulin  100 each 3   lamoTRIgine  (LAMICTAL ) 200 MG tablet Take 2 tablets (400 mg total) by mouth at bedtime.     Lancets 30G MISC Check fasting blood sugar every morning and before meals up to 4 times daily. 100 each 12   lidocaine  (LIDODERM ) 5 % Place 1 patch onto the skin every 12 (twelve) hours. Remove & Discard patch within 12 hours or as  directed by MD 30 patch 0   lidocaine  (XYLOCAINE ) 5 % ointment Apply topically to affected areas 3 times daily as needed. 35.44 g 0   lisinopril  (ZESTRIL ) 5 MG tablet Take 1 tablet (5 mg total) by mouth daily. 90 tablet 2   meloxicam  (MOBIC ) 15 MG tablet Take 1 tablet by mouth once every morning with a meal for 2 weeks, then daily as needed for pain. 30 tablet 0   Menthol , Topical Analgesic, (BENGAY EX) Apply 1 Application topically daily as needed (pain).     mycophenolate  (CELLCEPT ) 500 MG tablet Take 2 tablets (1,000mg )  twice per day; if you are tolerating it well after 2 weeks and labs look good increase to 3 tablets (1,500mg ) twice per day 120 tablet 2   neomycin-bacitracin -polymyxin [phone] OINT Apply topically 2 (two) times daily for 10 days To corners of mouth 14 g 2   nystatin  (NYAMYC ) powder Apply 3 times daily as directed. 60 g 3   nystatin  ointment (MYCOSTATIN ) Apply to the skin 2 times daily to corners of mouth 15 g 2   ondansetron  (ZOFRAN -ODT) 4 MG disintegrating tablet Dissolve 1 tablet (4 mg total) by mouth 2 (two) times daily as needed for nausea or vomiting. 20 tablet 1   pantoprazole  (PROTONIX ) 40 MG tablet Take 2 tablets (80 mg total) by mouth 2 (two) times daily. 60 tablet 3   pimecrolimus  (ELIDEL ) 1 % cream Apply a small amount to affected area 2 x daily. Gently pat area dry w/cotton gauze. No eating, drinking or speaking for 20 - 30 minutes to allow medicine to be absorbed. Can apply to 2x2 gauze & placed against the affected area. 60 g 3   pregabalin  (LYRICA ) 75 MG capsule Take 1 capsule (75 mg total) by mouth 3 (three) times daily. 90 capsule 3   risperiDONE  (RISPERDAL ) 2 MG tablet Take 1 tablet (2 mg total) by mouth at bedtime. 90 tablet 1   rosuvastatin  (CRESTOR ) 20 MG tablet Take 1 tablet (20 mg total) by mouth daily. 90 tablet 3   sertraline  (ZOLOFT ) 100 MG tablet Take 2 tablets (200 mg total) by mouth every morning. 180 tablet 3   spironolactone  (ALDACTONE ) 50 MG  tablet Take 1 tablet (50 mg total) by mouth 2 (two) times daily. 180 tablet 2   SUMAtriptan  (IMITREX ) 100 MG tablet Take 1 tablet (100 mg total) by mouth every 2 (two) hours as needed for migraine. May repeat in 2 hours if headache persists or recurs. 10 tablet 11   SYNTHROID  200 MCG tablet Take 1 tablet (200 mcg total) by mouth daily before breakfast. 90 tablet 1   tacrolimus  (PROGRAF ) 1 MG capsule Open two capsules and dissolve in (0.5 liters) of water . Label well and keep in fridge. Swish and spit 10mL twice a day. 30 capsule 2   tirzepatide  (MOUNJARO ) 12.5 MG/0.5ML Pen Inject 12.5 mg into the skin once a week. 6 mL 2   tiZANidine  (ZANAFLEX ) 4 MG tablet Take 1 tablet (4 mg total) by mouth every 6 (six) hours as needed. 30 tablet 6   traMADol  (ULTRAM ) 50 MG tablet Take 1 tablet (50 mg total) by mouth daily as needed for moderate pain (pain score 4-6). 30 tablet 0   traZODone  (DESYREL ) 100 MG tablet Take 1 tablet (100 mg total) by mouth at bedtime.     Current Facility-Administered Medications on File Prior to Visit  Medication Dose Route Frequency Provider Last Rate Last Admin   botulinum toxin Type A  (BOTOX ) injection 155 Units  155 Units Intramuscular Once Onita Duos, MD       Past Medical History:  Diagnosis Date   Acanthosis nigricans, acquired 02/09/2015   Allergy ?   Anemia    Anxiety and depression 10/31/2014   Arthritis    back, knees, right elbow   Arthritis of left hip 06/25/2018   Bilateral swelling of feet and ankles    Bipolar disorder (HCC)    Borderline personality disorder (HCC)    Cancer (HCC)    oral lichen planus, uses mouth wash, cream   Carpal tunnel syndrome 09/06/2015   Chewing  difficulty    Chronic left shoulder pain 05/17/2020   Constipation    Coronary artery disease 03/21/2021   COVID 10/08/2021   Cystic teratoma    BENIGN   Dental crowns present    Diabetes mellitus without complication (HCC)    Diabetes mellitus, type 2 (HCC) 06/10/2013    Diarrhea    Diverticulosis of colon without hemorrhage    DOE (dyspnea on exertion) 03/21/2021   Dyspepsia 12/14/2020   Essential hypertension 10/02/2007   Qualifier: Diagnosis of  By: Kassie MD, Alyce A    Facial rash 06/16/2019   Female hirsutism 02/09/2015   Food allergy    Walnuts   GAD (generalized anxiety disorder)    Gastritis and gastroduodenitis    Gastroesophageal reflux disease    Grade II internal hemorrhoids    Greater trochanteric bursitis of left hip 07/24/2018   Hemorrhoids, internal 07/18/2017   History of kidney stones    History of migraine headaches    History of posttraumatic stress disorder (PTSD)    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/2015   Hyperlipidemia    Hypothyroidism    HYPOTHYROIDISM, POSTSURGICAL 10/02/2007   Qualifier: Diagnosis of  By: Kassie MD, Sean A    IBS (irritable bowel syndrome)    Infertility associated with anovulation 02/09/2015   Insulin  resistance 02/09/2015   Joint pain    Leg swelling 09/21/2020   Lichen planus    Liver cirrhosis secondary to NASH (nonalcoholic steatohepatitis) (HCC)    Lower back pain 07/18/2017   MDD (major depressive disorder) 06/06/2015   Migraine 10/02/2007   Morbid obesity with BMI of 40.0-44.9, adult (HCC) 06/23/2020   NASH (nonalcoholic steatohepatitis)    Nausea without vomiting    Nonalcoholic fatty liver disease 02/09/2015   Obstructive sleep apnea 06/27/2014   Oral mucosal lesion 06/17/2019   Osteoarthritis    Other fatigue    PCOS (polycystic ovarian syndrome)    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   Qualifier: Diagnosis of  By: Kassie MD, Sean A    Trigeminal neuralgia    Unilateral primary osteoarthritis, left hip 02/10/2019   Vitamin D  deficiency    Past Surgical History:  Procedure Laterality Date   ACHILLES TENDON SURGERY Left 2007   APPENDECTOMY     CARPAL TUNNEL RELEASE Right 01/12/2016   Procedure: RIGHT CARPAL TUNNEL RELEASE;  Surgeon: Elsie Mussel, MD;  Location: [facility];  Service: Orthopedics;  Laterality: Right;   COLONOSCOPY WITH PROPOFOL  N/A 04/20/2014   Procedure: COLONOSCOPY WITH PROPOFOL ;  Surgeon: Elsie Cree, MD;  Location: [facility];  Service: Endoscopy;  Laterality: N/A;   COLONOSCOPY WITH PROPOFOL  N/A 01/10/2021   Procedure: COLONOSCOPY WITH PROPOFOL ;  Surgeon: San Sandor GAILS, DO;  Location: [facility];  Service: Gastroenterology;  Laterality: N/A;   ESOPHAGOGASTRODUODENOSCOPY (EGD)  WITH PROPOFOL  N/A 04/20/2014   Procedure: ESOPHAGOGASTRODUODENOSCOPY (EGD) WITH PROPOFOL ;  Surgeon: Elsie Cree, MD;  Location: [facility];  Service: Endoscopy;  Laterality: N/A;   ESOPHAGOGASTRODUODENOSCOPY (EGD) WITH PROPOFOL  N/A 01/10/2021   Procedure: ESOPHAGOGASTRODUODENOSCOPY (EGD) WITH PROPOFOL ;  Surgeon: San Sandor GAILS, DO;  Location: [facility];  Service: Gastroenterology;  Laterality: N/A;   EXCISION HAGLUND'S DEFORMITY WITH ACHILLES TENDON REPAIR Right 02/25/2013   Procedure: RIGHT ACHILLES DEBRIDEMENT AND RECONSTRUCTION;  HAGLUND'S EXCISION; GASTROC RECESSION AND FLEXOR HALLUCIS LONGUS TRANSFER;  Surgeon: Norleen Armor, MD;  Location: [facility];  Service: Orthopedics;  Laterality: Right;   IR ANGIOGRAM SELECTIVE EACH ADDITIONAL VESSEL  05/15/2023   IR ANGIOGRAM SELECTIVE EACH  ADDITIONAL VESSEL  05/15/2023   IR ANGIOGRAM SELECTIVE EACH ADDITIONAL VESSEL  05/15/2023   IR ANGIOGRAM SELECTIVE EACH ADDITIONAL VESSEL  05/15/2023   IR EMBO ARTERIAL NOT HEMORR HEMANG INC GUIDE ROADMAPPING  05/15/2023   IR RADIOLOGIST EVAL & MGMT  04/16/2023   IR US  GUIDE VASC ACCESS LEFT  05/15/2023   JOINT REPLACEMENT     hip   LUMBAR LAMINECTOMY     X 3   neck fusion     OVARIAN CYST SURGERY Left 1998   PILONIDAL CYST EXCISION  1994   RIGHT OOPHORECTOMY     SHOULDER ARTHROSCOPY WITH ROTATOR CUFF REPAIR Right 10/17/2022   Procedure: Right shoulder arthroscopy, debridement, subacromial decompression, distal clavicle resection, rotator cuff repair;  Surgeon: Melita Drivers, MD;  Location: [facility];  Service: Orthopedics;  Laterality: Right;    SPINE SURGERY     STERIOD INJECTION Left 01/12/2016   Procedure: STEROID INJECTION LEFT WRIST;  Surgeon: Elsie Mussel, MD;  Location: [facility];  Service: Orthopedics;  Laterality: Left;   TOTAL HIP ARTHROPLASTY Left 03/19/2019   Procedure: LEFT TOTAL HIP ARTHROPLASTY ANTERIOR APPROACH;  Surgeon: Vernetta Lonni GRADE, MD;  Location: [facility];  Service: Orthopedics;  Laterality: Left;   TOTAL THYROIDECTOMY  2003   UPPER GI ENDOSCOPY  01/19/2015    Family History  Problem Relation Age of Onset   Heart disease Father    Depression Father    Heart attack Father    Sudden death Father    Anxiety disorder Father    Bipolar disorder Father    Obesity Father    Cancer Brother        THROAT cancer   Depression Brother    Anxiety disorder Brother    Depression Sister    Breast cancer Mother    Hypertension Mother    Cancer Mother    Depression Mother    Sleep apnea Mother    Obesity Mother    Varicose Veins Mother    Depression Maternal Grandmother    Stroke Maternal Grandfather    Colon cancer Brother    Stroke Paternal Grandmother    Anxiety disorder Sister    Depression Sister    Hearing loss Sister     Diabetes Brother    Hearing loss Brother    Obesity Brother    Pancreatic cancer Neg Hx    Stomach cancer Neg Hx    Liver disease Neg Hx    Social History   Socioeconomic History   Marital status: Divorced    Spouse name: Not on file   Number of children: 0   Years of education: 16   Highest education level: Bachelor's degree (e.g., BA, AB, BS)  Occupational History   Occupation: RN-Telephone Triage Nurse    Employer:   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   Drug use: No   Sexual activity: Not Currently    Partners: Male    Birth control/protection: Post-menopausal    Comment: Divorced  Other Topics Concern   Not on file  Social History Narrative   RN - [place]   Regular exercise: no   Caffeine  use: 2 x daily   Right-handed.   Lives alone.   Social Drivers of Corporate investment banker Strain: Low Risk  (03/01/2024)   Overall Financial Resource Strain (CARDIA)    Difficulty of Paying Living Expenses: Not hard at all  Food Insecurity: No Food Insecurity (03/01/2024)   Hunger Vital Sign    Worried About Running Out of Food in the Last Year: Never true    Ran Out of Food in the Last Year: Never true  Transportation Needs: No Transportation Needs (03/01/2024)   PRAPARE - Administrator, Civil Service (Medical): No    Lack of Transportation (Non-Medical): No  Physical Activity: Insufficiently Active (03/01/2024)   Exercise Vital Sign    Days of Exercise per Week: 1 day    Minutes of Exercise per Session:  10 min  Stress: Stress Concern Present (03/01/2024)   [employer] of Occupational Health - Occupational Stress Questionnaire    Feeling of Stress: Rather much  Social Connections: Moderately Isolated (03/01/2024)   Social Connection and Isolation Panel    Frequency of Communication with Friends and Family: More than three times a week    Frequency of Social Gatherings with Friends and Family: Once a week    Attends Religious Services: More than 4 times per year    Active Member of [employer] or Organizations: No    Attends Engineer, structural: Not on file    Marital Status: Divorced    Objective:  BP 110/60   Pulse 65   Temp (!) 97.2 F (36.2 C)   Resp 16   Ht 5' 11 (1.803 m)   Wt 274 lb (124.3 kg)   LMP 08/24/2020   SpO2 98%   BMI 38.22 kg/m      04/13/2024   10:45 AM 03/30/2024    1:24 PM 03/03/2024    3:02 PM  BP/Weight  Systolic BP 110 120 120  Diastolic BP 60 74 70  Wt. (Lbs) 274 277 280  BMI 38.22 kg/m2 38.63 kg/m2 39.05 kg/m2    Physical Exam Vitals and nursing note reviewed.  Constitutional:      Appearance: She is obese.  HENT:     Head: Normocephalic and atraumatic.  Cardiovascular:     Rate and Rhythm: Normal rate and regular rhythm.  Pulmonary:     Effort: Pulmonary effort is normal.     Breath sounds: Normal breath sounds.  Musculoskeletal:     Comments: Mild left gluteal tenderness Positive SLR on left Normal reflexes Normal strength  Neurological:     Mental Status: She is alert.  Lab Results  Component Value Date   WBC 7.9 03/03/2024   HGB 15.9 03/03/2024   HCT 50.0 (H) 03/03/2024   PLT 190 03/03/2024   GLUCOSE 92 03/03/2024   CHOL 290 (H) 03/03/2024   TRIG 221 (H) 03/03/2024   HDL 45 03/03/2024   LDLCALC 202 (H) 03/03/2024   ALT 18 03/03/2024   AST 18 03/03/2024   NA 136 03/03/2024   K 4.2 03/03/2024   CL 98 03/03/2024   CREATININE 1.07 (H) 03/03/2024   BUN 13 03/03/2024   CO2 20 03/03/2024   TSH 6.620 (H)  03/03/2024   INR 1.0 05/15/2023   HGBA1C 5.5 03/03/2024      Assessment & Plan:  Acute left-sided low back pain with left-sided sciatica Assessment & Plan: ACUTE LEFT LOW BACK PAIN WITH LEFT SCIATICA.  Acute low back pain began before September 4th, initially localized to the left buttock, now radiating to the left hip. Pain is constant, severe, and unresponsive to Tylenol , tramadol , Flexeril , and meloxicam . No leg weakness or numbness. No spinal tenderness. Likely musculoskeletal, possibly due to muscle strain causing inflammation and sciatic nerve pressure. Unlikely disc herniation due to lack of injury. No recent lumbar spine imaging. Discussed potential for increased blood sugar with Medrol  Dosepak, but she has well-managed blood sugar with previous use. - Prescribe Medrol  Dosepak for inflammation. - Advise against meloxicam  while on Medrol  Dosepak to avoid gastrointestinal and renal complications. - Continue muscle relaxants as needed. - Apply heat and cold compresses, and perform gentle movements. - Seek emergency care if pain worsens, or if leg weakness, numbness, or loss of bladder control occurs. - No immediate need for lumbar spine imaging unless symptoms persist or worsen.    Hypokalemia Assessment & Plan: Potassium level is low at 3.0 mEq/L,as of blood work yesterday, which can cause cramps and spasms. No current potassium supplementation or medications that could lower potassium levels. Recent illness may not be the cause of hypokalemia. - Prescribe potassium chloride  20 mEq daily for 5 days. - Encourage consumption of potassium-rich foods such as greens, spinach, and broccoli.   Other orders -     methylPREDNISolone ; Take by mouth 6 tabs on day 1, 5 tabs on day 2, 4 tabs on day 3, 3 tabs on day 4, 2 tabs on day 5,  1 tab on day 6.  Dispense: 21 tablet; Refill: 1 -     Potassium Chloride  ER; Take 2 capsules (20 mEq total) by mouth daily for 5 days.  Dispense: 10 capsule;  Refill: 0    Assessment and Plan           Assessment and Plan      Meds ordered this encounter  Medications   methylPREDNISolone  (MEDROL  DOSEPAK) 4 MG TBPK tablet    Sig: Take by mouth 6 tabs on day 1, 5 tabs on day 2, 4 tabs on day 3, 3 tabs on day 4, 2 tabs on day 5,  1 tab on day 6.    Dispense:  21 tablet    Refill:  1   potassium chloride  (MICRO-K ) 10 MEQ CR capsule    Sig: Take 2 capsules (20 mEq total) by mouth daily for 5 days.    Dispense:  10 capsule    Refill:  0    No orders of the defined types were placed in this encounter.    Follow-up: No follow-ups on file.    An After Visit Summary was printed and given to the patient.  Bayne Fosnaugh, MD [facility] [phone]

## 2024-04-13 NOTE — Assessment & Plan Note (Signed)
 ACUTE LEFT LOW BACK PAIN WITH LEFT SCIATICA.  Acute low back pain began before September 4th, initially localized to the left buttock, now radiating to the left hip. Pain is constant, severe, and unresponsive to Tylenol , tramadol , Flexeril , and meloxicam . No leg weakness or numbness. No spinal tenderness. Likely musculoskeletal, possibly due to muscle strain causing inflammation and sciatic nerve pressure. Unlikely disc herniation due to lack of injury. No recent lumbar spine imaging. Discussed potential for increased blood sugar with Medrol  Dosepak, but she has well-managed blood sugar with previous use. - Prescribe Medrol  Dosepak for inflammation. - Advise against meloxicam  while on Medrol  Dosepak to avoid gastrointestinal and renal complications. - Continue muscle relaxants as needed. - Apply heat and cold compresses, and perform gentle movements. - Seek emergency care if pain worsens, or if leg weakness, numbness, or loss of bladder control occurs. - No immediate need for lumbar spine imaging unless symptoms persist or worsen.

## 2024-04-13 NOTE — Patient Instructions (Addendum)
  Contains text generated by Abridge.   VISIT SUMMARY: You visited today due to severe low back pain that started last week and has spread to your left hip. We also discussed your low potassium levels and emotional distress related to your job.  YOUR PLAN: ACUTE LOW BACK PAIN WITH LEFT BUTTOCK AND HIP RADIATION: You have severe low back pain that started in your left buttock and now radiates to your left hip. The pain is constant and has not improved with over-the-counter medications or muscle relaxants. -Start taking Medrol  Dosepak to reduce inflammation. -Stop taking meloxicam  while on Medrol  Dosepak to avoid complications. -Continue using muscle relaxants as needed. -Apply heat and cold compresses, and perform gentle movements. -Seek emergency care if the pain worsens, or if you experience leg weakness, numbness, or loss of bladder control. -No immediate need for lumbar spine imaging unless symptoms persist or worsen.  HYPOKALEMIA: Your potassium level is low, which can cause cramps and spasms. -Take potassium chloride  20 mEq daily for 5 days. -Eat potassium-rich foods such as greens, spinach, and broccoli.                        Contains text generated by Abridge.                                 Contains text generated by Abridge.

## 2024-04-13 NOTE — Assessment & Plan Note (Signed)
 Potassium level is low at 3.0 mEq/L,as of blood work yesterday, which can cause cramps and spasms. No current potassium supplementation or medications that could lower potassium levels. Recent illness may not be the cause of hypokalemia. - Prescribe potassium chloride  20 mEq daily for 5 days. - Encourage consumption of potassium-rich foods such as greens, spinach, and broccoli.

## 2024-04-15 DIAGNOSIS — F3181 Bipolar II disorder: Secondary | ICD-10-CM | POA: Diagnosis not present

## 2024-04-19 ENCOUNTER — Other Ambulatory Visit: Payer: Self-pay

## 2024-04-19 DIAGNOSIS — M5442 Lumbago with sciatica, left side: Secondary | ICD-10-CM

## 2024-04-20 ENCOUNTER — Other Ambulatory Visit (HOSPITAL_COMMUNITY): Payer: Self-pay

## 2024-04-20 ENCOUNTER — Ambulatory Visit (HOSPITAL_BASED_OUTPATIENT_CLINIC_OR_DEPARTMENT_OTHER)
Admission: RE | Admit: 2024-04-20 | Discharge: 2024-04-20 | Disposition: A | Discharge status: Home / Self Care | Source: Ambulatory Visit | Admitting: Radiology

## 2024-04-20 ENCOUNTER — Other Ambulatory Visit: Payer: Self-pay

## 2024-04-20 DIAGNOSIS — M5440 Lumbago with sciatica, unspecified side: Secondary | ICD-10-CM | POA: Diagnosis not present

## 2024-04-20 DIAGNOSIS — M47816 Spondylosis without myelopathy or radiculopathy, lumbar region: Secondary | ICD-10-CM | POA: Diagnosis not present

## 2024-04-20 DIAGNOSIS — M48061 Spinal stenosis, lumbar region without neurogenic claudication: Secondary | ICD-10-CM | POA: Diagnosis not present

## 2024-04-20 DIAGNOSIS — M5442 Lumbago with sciatica, left side: Secondary | ICD-10-CM

## 2024-04-20 DIAGNOSIS — M5126 Other intervertebral disc displacement, lumbar region: Secondary | ICD-10-CM | POA: Diagnosis not present

## 2024-04-22 ENCOUNTER — Other Ambulatory Visit: Payer: Self-pay

## 2024-04-22 DIAGNOSIS — F3181 Bipolar II disorder: Secondary | ICD-10-CM | POA: Diagnosis not present

## 2024-04-23 ENCOUNTER — Other Ambulatory Visit: Payer: Self-pay

## 2024-04-23 ENCOUNTER — Other Ambulatory Visit (HOSPITAL_COMMUNITY): Payer: Self-pay

## 2024-04-26 ENCOUNTER — Ambulatory Visit: Payer: Self-pay

## 2024-05-03 ENCOUNTER — Other Ambulatory Visit (HOSPITAL_BASED_OUTPATIENT_CLINIC_OR_DEPARTMENT_OTHER): Payer: Self-pay

## 2024-05-03 ENCOUNTER — Encounter: Payer: Self-pay | Admitting: Adult Health

## 2024-05-03 ENCOUNTER — Telehealth (INDEPENDENT_AMBULATORY_CARE_PROVIDER_SITE_OTHER): Admitting: Adult Health

## 2024-05-03 DIAGNOSIS — F4310 Post-traumatic stress disorder, unspecified: Secondary | ICD-10-CM

## 2024-05-03 DIAGNOSIS — F410 Panic disorder [episodic paroxysmal anxiety] without agoraphobia: Secondary | ICD-10-CM

## 2024-05-03 DIAGNOSIS — F332 Major depressive disorder, recurrent severe without psychotic features: Secondary | ICD-10-CM | POA: Diagnosis not present

## 2024-05-03 DIAGNOSIS — F411 Generalized anxiety disorder: Secondary | ICD-10-CM | POA: Diagnosis not present

## 2024-05-03 MED ORDER — FLUCONAZOLE 150 MG PO TABS
ORAL_TABLET | ORAL | 0 refills | Status: DC
  Filled 2024-05-03 – 2024-05-17 (×2): qty 5, 4d supply, fill #0

## 2024-05-03 NOTE — Progress Notes (Signed)
 Tracy [name] [id number] [birth year] 58 y.o.  Virtual Visit via Video Note  I connected with pt @ on 05/03/24 at  2:30 PM EDT by a video enabled telemedicine application and verified that I am speaking with the correct person using two identifiers.   I discussed the limitations of evaluation and management by telemedicine and the availability of in person appointments. The patient expressed understanding and agreed to proceed.  I discussed the assessment and treatment plan with the patient. The patient was provided an opportunity to ask questions and all were answered. The patient agreed with the plan and demonstrated an understanding of the instructions.   The patient was advised to call back or seek an in-person evaluation if the symptoms worsen or if the condition fails to improve as anticipated.  I provided 25 minutes of non-face-to-face time during this encounter.  The patient was located at home.  The provider was located at [facility].   Tracy LOISE Sayers, NP   Subjective:   Patient ID:  Tracy [name] is a 58 y.o. (DOB March 22, 1966) female.  Chief Complaint: No chief complaint on file.   HPI Tracy [name] presents for follow-up of MDD, GAD, PTSD, and panic attacks.  Describes mood today as not too good. Pleasant. Flat. Denies tearfulness - no emotions. Mood symptoms - reports depression. Reports catastrophic thinking. Reports situational anxiety and irritability. Reports lower interest and motivation. Reports recent panic attacks. Reports worry, rumination, and over thinking. Reports intrusive thoughts. Reports some flashbacks - childhood. Reports increased stressors in the work setting. Reports chronic pain back - left knee - awaiting a surgery. Reports mood is lower. Stating I haven't been feeling good. Feels like medications are helpful, but is struggling. Seeing therapist regularly. Taking medications as prescribed. Energy levels lower. Active, does not have a  regular exercise routine. Walking dogs. Enjoys some usual interests and activities. Lives alone with 3 dogs. Divorced. Siblings local. Spending time with family. Attends church. Appetite decreased. Weight loss - 275 to 280 pounds.  Reports difficulties with sleep. Averages 3 to 4 hours. Sleep apnea - using CPAP machine - taking Nuvigil  200mg  in the morning.  Denies SI or HI. Reports AH. Denies VH.  Denies self harm. Denies substance use.  Working with Donny Payor - therapy.  Previous medication trials:  Antipsychotics: Vraylar-TD, Latuda, Abilify, Geodon, Rexulti, Lybalvi, Risperdal   Mood Stabilizers - Depakote, Lithium, Lamictal , Equetro, Topamax , Trileptal, Gabapentin   SSRI - Zoloft , Lexapro, Celexa, Prozac , Viibryd, Trintellix , Prozac   SNRI - Pristiq, Effexor , Cymbalta   Wellbutrin - allergic reaction  Anti-anxiety - Buspar, Xanax , Clonazepam , Ativan, Valium  Sleep agents - Trazadone, Ambien , Restoril   Other - Deplin, Emsam, Serzone, Doxepin, Nuvigil , Vistaril   Previous treatment: ECT and TMS     Review of Systems:  Review of Systems  Musculoskeletal:  Negative for gait problem.  Neurological:  Negative for tremors.  Psychiatric/Behavioral:         Please refer to HPI    Medications: I have reviewed the patient's current medications.  Current Outpatient Medications  Medication Sig Dispense Refill   albuterol  (VENTOLIN  HFA) 108 (90 Base) MCG/ACT inhaler Inhale 2 puffs into the lungs every 6 (six) hours as needed for wheezing or shortness of breath. 8 g 2   AMBULATORY NON FORMULARY MEDICATION Rollator for use daily. Elevated toilet seat with handlebars 1 each 0   Armodafinil  200 MG TABS Take 1 tablet (200 mg total) by mouth daily as needed (sleepiness). 30 tablet 2   Atogepant  (  QULIPTA ) 60 MG TABS Take 1 tablet (60 mg total) by mouth daily. 30 tablet 11   Blood Glucose Monitoring Suppl (FREESTYLE LITE) w/Device KIT Use as directed. 1 kit 0   Blood Glucose  Monitoring Suppl (FREESTYLE LITE) w/Device KIT Check fasting blood sugar every morning and before meals up to 4 times daily. 1 kit PRN   botulinum toxin Type A  (BOTOX ) 200 units injection Inject 200 Units into the muscle every 3 (three) months. 1 each 2   clobetasol  ointment (TEMOVATE ) 0.05 % Apply topically 2 (two) times daily. 60 g 6   clonazePAM  (KLONOPIN ) 1 MG tablet Take 1 tablet (1 mg total) by mouth 3 (three) times daily. 90 tablet 2   Continuous Glucose Sensor (DEXCOM G7 SENSOR) MISC Use to check glucose throughout the day.  Change every 10 days 9 each 1   cyclobenzaprine  (FLEXERIL ) 10 MG tablet Take 1 tablet (10 mg total) by mouth 3 (three) times daily as needed for muscle spasms. 30 tablet 1   furosemide  (LASIX ) 40 MG tablet Take 1 tablet (40 mg total) by mouth daily. 90 tablet 0   glucose blood (FREESTYLE LITE) test strip Use as instructed 100 each 12   glucose blood (FREESTYLE LITE) test strip Check fasting blood sugar every morning and before meals up to 4 times a day as directed. 100 strip 12   hydrOXYzine  (ATARAX ) 25 MG tablet Take 1 tablet (25 mg total) by mouth 3 (three) times daily as needed for anxiety. 270 tablet 1   Insulin  Pen Needle (UNIFINE PENTIPS) 31G X 5 MM MISC Use as directed to inject insulin  100 each 3   lamoTRIgine  (LAMICTAL ) 200 MG tablet Take 2 tablets (400 mg total) by mouth at bedtime.     Lancets 30G MISC Check fasting blood sugar every morning and before meals up to 4 times daily. 100 each 12   lidocaine  (LIDODERM ) 5 % Place 1 patch onto the skin every 12 (twelve) hours. Remove & Discard patch within 12 hours or as directed by MD 30 patch 0   lidocaine  (XYLOCAINE ) 5 % ointment Apply topically to affected areas 3 times daily as needed. 35.44 g 0   lisinopril  (ZESTRIL ) 5 MG tablet Take 1 tablet (5 mg total) by mouth daily. 90 tablet 2   meloxicam  (MOBIC ) 15 MG tablet Take 1 tablet by mouth once every morning with a meal for 2 weeks, then daily as needed for pain.  30 tablet 0   Menthol , Topical Analgesic, (BENGAY EX) Apply 1 Application topically daily as needed (pain).     methylPREDNISolone  (MEDROL  DOSEPAK) 4 MG TBPK tablet Take by mouth 6 tabs on day 1, 5 tabs on day 2, 4 tabs on day 3, 3 tabs on day 4, 2 tabs on day 5,  1 tab on day 6. 21 tablet 1   mycophenolate  (CELLCEPT ) 500 MG tablet Take 2 tablets (1,000mg ) twice per day; if you are tolerating it well after 2 weeks and labs look good increase to 3 tablets (1,500mg ) twice per day 120 tablet 2   neomycin-bacitracin -polymyxin [phone] OINT Apply topically 2 (two) times daily for 10 days To corners of mouth 14 g 2   nystatin  (NYAMYC ) powder Apply 3 times daily as directed. 60 g 3   nystatin  ointment (MYCOSTATIN ) Apply to the skin 2 times daily to corners of mouth 15 g 2   ondansetron  (ZOFRAN -ODT) 4 MG disintegrating tablet Dissolve 1 tablet (4 mg total) by mouth 2 (two) times daily as needed for  nausea or vomiting. 20 tablet 1   pantoprazole  (PROTONIX ) 40 MG tablet Take 2 tablets (80 mg total) by mouth 2 (two) times daily. 60 tablet 3   pimecrolimus  (ELIDEL ) 1 % cream Apply a small amount to affected area 2 x daily. Gently pat area dry w/cotton gauze. No eating, drinking or speaking for 20 - 30 minutes to allow medicine to be absorbed. Can apply to 2x2 gauze & placed against the affected area. 60 g 3   potassium chloride  (MICRO-K ) 10 MEQ CR capsule Take 2 capsules (20 mEq total) by mouth daily for 5 days. 10 capsule 0   pregabalin  (LYRICA ) 75 MG capsule Take 1 capsule (75 mg total) by mouth 3 (three) times daily. 90 capsule 3   risperiDONE  (RISPERDAL ) 2 MG tablet Take 1 tablet (2 mg total) by mouth at bedtime. 90 tablet 1   rosuvastatin  (CRESTOR ) 20 MG tablet Take 1 tablet (20 mg total) by mouth daily. 90 tablet 3   sertraline  (ZOLOFT ) 100 MG tablet Take 2 tablets (200 mg total) by mouth every morning. 180 tablet 3   spironolactone  (ALDACTONE ) 50 MG tablet Take 1 tablet (50 mg total) by mouth 2 (two)  times daily. 180 tablet 2   SUMAtriptan  (IMITREX ) 100 MG tablet Take 1 tablet (100 mg total) by mouth every 2 (two) hours as needed for migraine. May repeat in 2 hours if headache persists or recurs. 10 tablet 11   SYNTHROID  200 MCG tablet Take 1 tablet (200 mcg total) by mouth daily before breakfast. 90 tablet 1   tacrolimus  (PROGRAF ) 1 MG capsule Open two capsules and dissolve in (0.5 liters) of water . Label well and keep in fridge. Swish and spit 10mL twice a day. 30 capsule 2   tirzepatide  (MOUNJARO ) 12.5 MG/0.5ML Pen Inject 12.5 mg into the skin once a week. 6 mL 2   tiZANidine  (ZANAFLEX ) 4 MG tablet Take 1 tablet (4 mg total) by mouth every 6 (six) hours as needed. 30 tablet 6   traMADol  (ULTRAM ) 50 MG tablet Take 1 tablet (50 mg total) by mouth daily as needed for moderate pain (pain score 4-6). 30 tablet 0   traZODone  (DESYREL ) 100 MG tablet Take 1 tablet (100 mg total) by mouth at bedtime.     Current Facility-Administered Medications  Medication Dose Route Frequency Provider Last Rate Last Admin   botulinum toxin Type A  (BOTOX ) injection 155 Units  155 Units Intramuscular Once Yan, Yijun, MD        Medication Side Effects: None  Allergies:  Allergies  Allergen Reactions   Bupropion Rash, Dermatitis, Hives, Itching and Swelling    bupropion   Sulfa Antibiotics Itching, Rash, Swelling and Dermatitis   Sulfamethoxazole-Trimethoprim Rash, Dermatitis and Swelling    sulfamethoxazole / trimethoprim   Sulfasalazine Dermatitis, Itching and Swelling   Walnut Itching   Food     Walnut-mouth swelling/itching   Tilactase Swelling    Walnut-mouth swelling/itching  alpha-D-galactosidase enzyme  lactase   Bupropion Hcl Itching and Rash   Codeine Itching    Past Medical History:  Diagnosis Date   Acanthosis nigricans, acquired 02/09/2015   Allergy ?   Anemia    Anxiety and depression 10/31/2014   Arthritis    back, knees, right elbow   Arthritis of left hip 06/25/2018    Bilateral swelling of feet and ankles    Bipolar disorder (HCC)    Borderline personality disorder (HCC)    Cancer (HCC)    oral lichen planus, uses mouth  wash, cream   Carpal tunnel syndrome 09/06/2015   Chewing difficulty    Chronic left shoulder pain 05/17/2020   Constipation    Coronary artery disease 03/21/2021   COVID 10/08/2021   Cystic teratoma    BENIGN   Dental crowns present    Diabetes mellitus without complication (HCC)    Diabetes mellitus, type 2 (HCC) 06/10/2013   Diarrhea    Diverticulosis of colon without hemorrhage    DOE (dyspnea on exertion) 03/21/2021   Dyspepsia 12/14/2020   Essential hypertension 10/02/2007   Qualifier: Diagnosis of  By: Kassie MD, Alyce A    Facial rash 06/16/2019   Female hirsutism 02/09/2015   Food allergy    Walnuts   GAD (generalized anxiety disorder)    Gastritis and gastroduodenitis    Gastroesophageal reflux disease    Grade II internal hemorrhoids    Greater trochanteric bursitis of left hip 07/24/2018   Hemorrhoids, internal 07/18/2017   History of kidney stones    History of migraine headaches    History of posttraumatic stress disorder (PTSD)    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/2015   Hyperlipidemia    Hypothyroidism    HYPOTHYROIDISM, POSTSURGICAL 10/02/2007   Qualifier: Diagnosis of  By: Kassie MD, Sean A    IBS (irritable bowel syndrome)    Infertility associated with anovulation 02/09/2015   Insulin  resistance 02/09/2015   Joint pain    Leg swelling 09/21/2020   Lichen planus    Liver cirrhosis secondary to NASH (nonalcoholic steatohepatitis) (HCC)    Lower back pain 07/18/2017   MDD (major depressive disorder) 06/06/2015   Migraine 10/02/2007   Morbid obesity with BMI of 40.0-44.9, adult (HCC) 06/23/2020   NASH (nonalcoholic steatohepatitis)    Nausea without vomiting    Nonalcoholic fatty liver disease 02/09/2015   Obstructive sleep apnea 06/27/2014   Oral mucosal lesion 06/17/2019   Osteoarthritis    Other  fatigue    PCOS (polycystic ovarian syndrome)    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   Qualifier: Diagnosis of  By: Kassie MD, Sean A    Trigeminal neuralgia    Unilateral primary osteoarthritis, left hip 02/10/2019   Vitamin D  deficiency     Family History  Problem Relation Age of Onset   Heart disease Father    Depression Father    Heart attack Father    Sudden death Father    Anxiety disorder Father    Bipolar disorder Father    Obesity Father    Cancer Brother        THROAT cancer   Depression Brother    Anxiety disorder Brother    Depression Sister    Breast cancer Mother    Hypertension Mother    Cancer Mother    Depression Mother    Sleep  apnea Mother    Obesity Mother    Varicose Veins Mother    Depression Maternal Grandmother    Stroke Maternal Grandfather    Colon cancer Brother    Stroke Paternal Grandmother    Anxiety disorder Sister    Depression Sister    Hearing loss Sister    Diabetes Brother    Hearing loss Brother    Obesity Brother    Pancreatic cancer Neg Hx    Stomach cancer Neg Hx    Liver disease Neg Hx     Social History   Socioeconomic History   Marital status: Divorced    Spouse name: Not on file   Number of children: 0   Years of education: 16   Highest education level: Bachelor's degree (e.g., BA,  AB, BS)  Occupational History   Occupation: RN-Telephone Triage Nurse    Employer: [employer]    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   Drug use: No   Sexual activity: Not Currently    Partners: Male    Birth control/protection: Post-menopausal    Comment: Divorced  Other Topics Concern   Not on file  Social History Narrative   RN -    Regular exercise: no   Caffeine  use: 2 x daily   Right-handed.   Lives alone.   Social Drivers of Corporate investment banker Strain: Low Risk  (03/01/2024)   Overall Financial Resource Strain (CARDIA)    Difficulty of Paying Living Expenses: Not hard at all  Food Insecurity: No Food Insecurity (03/01/2024)   Hunger Vital Sign    Worried About Running Out of Food in the Last Year: Never true    Ran Out of Food in the Last Year: Never true  Transportation Needs: No Transportation Needs (03/01/2024)   PRAPARE - Administrator, Civil Service (Medical): No    Lack of Transportation (Non-Medical): No  Physical Activity: Insufficiently Active (03/01/2024)   Exercise Vital Sign    Days of Exercise per Week: 1 day    Minutes of Exercise per Session: 10 min  Stress: Stress Concern Present (03/01/2024)   [employer] of Occupational Health - Occupational Stress Questionnaire    Feeling of Stress: Rather much  Social Connections: Moderately Isolated (03/01/2024)   Social Connection and Isolation Panel    Frequency of Communication with Friends and Family: More than three times a week     Frequency of Social Gatherings with Friends and Family: Once a week    Attends Religious Services: More than 4 times per year    Active Member of [employer] or Organizations: No    Attends Banker Meetings: Not on file    Marital Status: Divorced  Intimate Partner Violence: Unknown (11/09/2021)   Received from [facility]   HITS    Physically Hurt: Not on file    Insult or Talk Down To: Not on file    Threaten Physical Harm:  Not on file    Scream or Curse: Not on file    Past Medical History, Surgical history, Social history, and Family history were reviewed and updated as appropriate.   Please see review of systems for further details on the patient's review from today.   Objective:   Physical Exam:  LMP 08/24/2020   Physical Exam Constitutional:      General: She is not in acute distress. Musculoskeletal:        General: No deformity.  Neurological:     Mental Status: She is alert and oriented to person, place, and time.     Coordination: Coordination normal.  Psychiatric:        Attention and Perception: Attention and perception normal. She does not perceive auditory or visual hallucinations.        Mood and Affect: Mood normal. Mood is not anxious or depressed. Affect is not labile, blunt, angry or inappropriate.        Speech: Speech normal.        Behavior: Behavior normal.        Thought Content: Thought content normal. Thought content is not paranoid or delusional. Thought content does not include homicidal or suicidal ideation. Thought content does not include homicidal or suicidal plan.        Cognition and Memory: Cognition and memory normal.        Judgment: Judgment normal.     Comments: Insight intact     Lab Review:     Component Value Date/Time   NA 136 03/03/2024 1559   K 4.2 03/03/2024 1559   CL 98 03/03/2024 1559   CO2 20 03/03/2024 1559   GLUCOSE 92 03/03/2024 1559   GLUCOSE 91 08/15/2023 1004   BUN 13 03/03/2024 1559   CREATININE 1.07  (H) 03/03/2024 1559   CREATININE 0.95 11/28/2022 1509   CALCIUM  9.8 03/03/2024 1559   PROT 7.2 03/03/2024 1559   ALBUMIN 4.4 03/03/2024 1559   AST 18 03/03/2024 1559   ALT 18 03/03/2024 1559   ALKPHOS 147 (H) 03/03/2024 1559   BILITOT 0.4 03/03/2024 1559   GFRNONAA >60 08/15/2023 1004   GFRNONAA 99 12/26/2020 0000   GFRAA 115 12/26/2020 0000       Component Value Date/Time   WBC 7.9 03/03/2024 1559   WBC 4.9 08/15/2023 1004   RBC 5.37 (H) 03/03/2024 1559   RBC 4.49 08/15/2023 1004   HGB 15.9 03/03/2024 1559   HCT 50.0 (H) 03/03/2024 1559   PLT 190 03/03/2024 1559   MCV 93 03/03/2024 1559   MCH 29.6 03/03/2024 1559   MCH 30.7 08/15/2023 1004   MCHC 31.8 03/03/2024 1559   MCHC 33.9 08/15/2023 1004   RDW 14.5 03/03/2024 1559   LYMPHSABS 2.2 03/03/2024 1559   MONOABS 0.3 08/15/2023 1004   EOSABS 0.1 03/03/2024 1559   BASOSABS 0.0 03/03/2024 1559    No results found for: POCLITH, LITHIUM   No results found for: PHENYTOIN, PHENOBARB, VALPROATE, CBMZ   .res Assessment: Plan:    Plan:  PDMP reviewed  Lamictal  - 2 - 200mg  daily Zoloft  100mg  - 2 daily Nuvigil  200mg  daily - takes every day Clonazepam  1mg  TID as needed for anxiety Hydroxyzine  25mg  TID - using 1 tablet at bedtime.  Risperdal  2mg  at hs  Trazadone 100mg  - take one tablet at bedtime    [facility] - therapist  RTC 3 months  25 minutes spent dedicated to the care of this patient on the date of this encounter to  include pre-visit review of records, ordering of medication, post visit documentation, and face-to-face time with the patient discussing MDD, GAD, PTSD, and panic attacks. Discussed increasing Trazadone to help with sleep.   Patient advised to contact office with any questions, adverse effects, or acute worsening in signs and symptoms.  Counseled patient regarding potential benefits, risks, and side effects of Lamictal  to include potential risk of Stevens-Johnson syndrome. Advised  patient to stop taking Lamictal  and contact office immediately if rash develops and to seek urgent medical attention if rash is severe and/or spreading quickly.   Discussed potential benefits, risk, and side effects of benzodiazepines to include potential risk of tolerance and dependence, as well as possible drowsiness. Advised patient not to drive if experiencing drowsiness and to take lowest possible effective dose to minimize risk of dependence and tolerance.  Discussed potential metabolic side effects associated with atypical antipsychotics, as well as potential risk for movement side effects. Advised pt to contact office if movement side effects occur.    Diagnoses and all orders for this visit:  Severe episode of recurrent major depressive disorder, without psychotic features (HCC)  PTSD (post-traumatic stress disorder)  GAD (generalized anxiety disorder)  Panic attacks     Please see After Visit Summary for patient specific instructions.  Future Appointments  Date Time Provider Department Center  06/25/2024  8:30 AM Berneta Sconyers, Tracy Mattocks, NP [facility] None  07/05/2024  3:00 PM Onita Duos, MD [facility] None  07/13/2024  1:40 PM Ivin Snuffer, MD [facility]    No orders of the defined types were placed in this encounter.     -------------------------------

## 2024-05-04 ENCOUNTER — Other Ambulatory Visit (HOSPITAL_BASED_OUTPATIENT_CLINIC_OR_DEPARTMENT_OTHER): Payer: Self-pay

## 2024-05-04 ENCOUNTER — Telehealth: Payer: Self-pay

## 2024-05-04 MED ORDER — FLUCONAZOLE 150 MG PO TABS
ORAL_TABLET | ORAL | 0 refills | Status: DC
  Filled 2024-05-04: qty 5, 3d supply, fill #0

## 2024-05-04 NOTE — Telephone Encounter (Signed)
 [facility] Preoperative Clearance Form

## 2024-05-05 DIAGNOSIS — F3181 Bipolar II disorder: Secondary | ICD-10-CM | POA: Diagnosis not present

## 2024-05-06 DIAGNOSIS — H524 Presbyopia: Secondary | ICD-10-CM | POA: Diagnosis not present

## 2024-05-09 ENCOUNTER — Other Ambulatory Visit (HOSPITAL_COMMUNITY): Payer: Self-pay

## 2024-05-09 ENCOUNTER — Other Ambulatory Visit: Payer: Self-pay | Admitting: Family Medicine

## 2024-05-09 DIAGNOSIS — M2550 Pain in unspecified joint: Secondary | ICD-10-CM

## 2024-05-10 ENCOUNTER — Other Ambulatory Visit: Payer: Self-pay

## 2024-05-10 ENCOUNTER — Other Ambulatory Visit: Payer: Self-pay | Admitting: Neurology

## 2024-05-10 MED ORDER — FUROSEMIDE 40 MG PO TABS
40.0000 mg | ORAL_TABLET | Freq: Every day | ORAL | 0 refills | Status: DC
  Filled 2024-05-10: qty 90, 90d supply, fill #0

## 2024-05-10 MED ORDER — PREGABALIN 75 MG PO CAPS
ORAL_CAPSULE | ORAL | 5 refills | Status: AC
  Filled 2024-05-10: qty 120, 30d supply, fill #0
  Filled 2024-07-01: qty 120, 30d supply, fill #1

## 2024-05-10 MED ORDER — SYNTHROID 200 MCG PO TABS
200.0000 ug | ORAL_TABLET | Freq: Every day | ORAL | 1 refills | Status: AC
  Filled 2024-05-10: qty 90, 90d supply, fill #0
  Filled 2024-08-04 – 2024-08-18 (×2): qty 90, 90d supply, fill #1

## 2024-05-11 ENCOUNTER — Other Ambulatory Visit: Payer: Self-pay

## 2024-05-11 ENCOUNTER — Other Ambulatory Visit (HOSPITAL_COMMUNITY): Payer: Self-pay

## 2024-05-11 MED ORDER — QULIPTA 60 MG PO TABS
60.0000 mg | ORAL_TABLET | Freq: Every day | ORAL | 11 refills | Status: DC
  Filled 2024-05-11: qty 30, 30d supply, fill #0
  Filled 2024-06-08: qty 30, 30d supply, fill #1
  Filled 2024-07-01: qty 30, 30d supply, fill #2

## 2024-05-12 DIAGNOSIS — F3181 Bipolar II disorder: Secondary | ICD-10-CM | POA: Diagnosis not present

## 2024-05-13 ENCOUNTER — Other Ambulatory Visit (HOSPITAL_BASED_OUTPATIENT_CLINIC_OR_DEPARTMENT_OTHER): Payer: Self-pay

## 2024-05-13 ENCOUNTER — Ambulatory Visit

## 2024-05-13 ENCOUNTER — Other Ambulatory Visit: Payer: Self-pay

## 2024-05-13 ENCOUNTER — Ambulatory Visit (HOSPITAL_BASED_OUTPATIENT_CLINIC_OR_DEPARTMENT_OTHER)
Admission: RE | Admit: 2024-05-13 | Discharge: 2024-05-13 | Disposition: A | Source: Ambulatory Visit | Admitting: Radiology

## 2024-05-13 VITALS — BP 96/60 | HR 84 | Temp 97.4°F | Resp 16 | Ht 71.0 in | Wt 277.0 lb

## 2024-05-13 DIAGNOSIS — M1611 Unilateral primary osteoarthritis, right hip: Secondary | ICD-10-CM | POA: Diagnosis not present

## 2024-05-13 DIAGNOSIS — M25562 Pain in left knee: Secondary | ICD-10-CM | POA: Diagnosis not present

## 2024-05-13 DIAGNOSIS — G8929 Other chronic pain: Secondary | ICD-10-CM | POA: Insufficient documentation

## 2024-05-13 DIAGNOSIS — M25551 Pain in right hip: Secondary | ICD-10-CM

## 2024-05-13 DIAGNOSIS — Z96642 Presence of left artificial hip joint: Secondary | ICD-10-CM | POA: Diagnosis not present

## 2024-05-13 NOTE — Assessment & Plan Note (Signed)
 Left knee osteoarthritis with scheduled knee replacement in December. No current exacerbation of symptoms reported.

## 2024-05-13 NOTE — Patient Instructions (Addendum)
  VISIT SUMMARY: You visited us  today due to severe right hip pain that started about two and a half weeks ago. We discussed your ongoing left knee osteoarthritis and your current weight management with Mounjaro  therapy.  YOUR PLAN: RIGHT HIP PAIN: You have been experiencing severe, constant pain in your right hip that radiates to your thigh and disrupts your sleep. This pain started without any preceding activity and has not been relieved by over-the-counter medications or other conservative measures. -We will order an x-ray of your right hip to check for osteoarthritis. -If the x-ray shows arthritis, please contact the orthopedic office to discuss the possibility of an ultrasound-guided steroid injection. -Continue using ice, massage, and range of motion exercises to manage the pain. -Avoid using strong pain medications.  LEFT KNEE OSTEOARTHRITIS: You have left knee osteoarthritis and are scheduled for a knee replacement in December. There are no new symptoms at this time. -No changes to your current plan are needed at this time.  OBESITY: You are managing your weight with Mounjaro  therapy and have maintained a stable weight since August. -Continue taking Mounjaro  as prescribed. -Consider increasing the dose to 15 mg after you finish your current doses.                      Contains text generated by Abridge.                                 Contains text generated by Abridge.

## 2024-05-13 NOTE — Progress Notes (Signed)
 Subjective:  Patient ID: Tracy [name], female    DOB: 1966-06-21  Age: 58 y.o. MRN: [id number]  Chief Complaint  Patient presents with   Hip Pain    Right    HPI: Discussed the use of AI scribe software for clinical note transcription with the patient, who gave verbal consent to proceed.  .Discussed the use of AI scribe software for clinical note transcription with the patient, who gave verbal consent to proceed.  History of Present Illness   Tracy [name] is a 58 year old female with left hip replacement and left knee arthritis who presents with right hip pain.  Right hip pain - Onset approximately two and a half weeks ago, awakening with pain without preceding activity - Pain described as a strain or pull, constant in nature - Pain radiates to the right quadriceps, occasionally extending to the groin and mid-upper thigh - Pain is severe, sometimes causing her to cry - Pain disrupts sleep, especially when lying on the right side - Pain is primarily on the outer aspect of the right hip - No associated rash, redness, swelling, or tingling - No relief with Tylenol , ice, cold therapy, massage, or range of motion exercises - No relief with leftover tramadol  - History of right greater trochanteric bursitis, but current pain is different in character  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 - History of left hip replacement - Scheduled for left knee replacement in December  Low back pain and sciatica - Previous left-sided low back pain and sciatica resolved after a course of prednisone  and use of ice, massage, and heat therapy - Currently performing cat and cow exercises for back pain - No formal physical therapy completed  Weight management - Currently taking Mounjaro , resulting in weight loss  Medication use - No prednisone  use since last prescription            03/30/2024    1:33 PM 03/03/2024    3:06 PM 07/30/2022   11:29 AM 09/04/2021    1:08 PM  03/14/2021    2:57 PM  Depression screen PHQ 2/9  Decreased Interest 2 1 1  3   Down, Depressed, Hopeless 2 0 2  3  PHQ - 2 Score 4 1 3  6   Altered sleeping 3 3 0  2  Tired, decreased energy 3 2 2  3   Change in appetite 0 1 2  2   Feeling bad or failure about yourself  0 0 3  3  Trouble concentrating 1 1 1  2   Moving slowly or fidgety/restless 0 0 0  1  Suicidal thoughts 0 0 0  0  PHQ-9 Score 11 8 11  19   Difficult doing work/chores Somewhat difficult Somewhat difficult Somewhat difficult  Extremely dIfficult     Information is confidential and restricted. Go to Review Flowsheets to unlock data.        03/30/2024    1:33 PM  Fall Risk   Falls in the past year? 1  Number falls in past yr: 1  Injury with Fall? 1  Risk for fall due to : History of fall(s)  Follow up Falls evaluation completed    Patient Care Team: Hadas Jessop, MD as PCP - General (Family Medicine) Norval, Toribio CROME, MD (Inactive) (Obstetrics and Gynecology) Vincente Grip, MD as Attending Physician (Psychiatry) Darden Planas, NP as Nurse Practitioner Claudene Arthea HERO, DO as Consulting Physician (Family Medicine)   Review of Systems  Constitutional:  Negative for chills, fatigue  and fever.  HENT:  Negative for congestion, ear pain and sore throat.   Respiratory:  Negative for cough and shortness of breath.   Cardiovascular:  Negative for chest pain and palpitations.  Gastrointestinal:  Negative for abdominal pain, constipation, diarrhea, nausea and vomiting.  Endocrine: Negative for polydipsia, polyphagia and polyuria.  Genitourinary:  Negative for difficulty urinating and dysuria.  Musculoskeletal:  Positive for arthralgias (right hip pain, chronic left knee pain). Negative for back pain and myalgias.  Skin:  Negative for rash.  Neurological:  Negative for headaches.  Psychiatric/Behavioral:  Negative for dysphoric mood. The patient is not nervous/anxious.     Current Outpatient Medications on File  Prior to Visit  Medication Sig Dispense Refill   albuterol  (VENTOLIN  HFA) 108 (90 Base) MCG/ACT inhaler Inhale 2 puffs into the lungs every 6 (six) hours as needed for wheezing or shortness of breath. 8 g 2   Armodafinil  200 MG TABS Take 1 tablet (200 mg total) by mouth daily as needed (sleepiness). 30 tablet 2   botulinum toxin Type A  (BOTOX ) 200 units injection Inject 200 Units into the muscle every 3 (three) months. 1 each 2   clobetasol  ointment (TEMOVATE ) 0.05 % Apply topically 2 (two) times daily. 60 g 6   clonazePAM  (KLONOPIN ) 1 MG tablet Take 1 tablet (1 mg total) by mouth 3 (three) times daily. 90 tablet 2   Continuous Glucose Sensor (DEXCOM G7 SENSOR) MISC Use to check glucose throughout the day.  Change every 10 days 9 each 1   cyclobenzaprine  (FLEXERIL ) 10 MG tablet Take 1 tablet (10 mg total) by mouth 3 (three) times daily as needed for muscle spasms. 30 tablet 1   fluconazole  (DIFLUCAN ) 150 MG tablet Take 2 tablets (300mg )  today and then 1 tablet (150mg ) for each of the following three days. 5 tablet 0   furosemide  (LASIX ) 40 MG tablet Take 1 tablet (40 mg total) by mouth daily. 90 tablet 0   glucose blood (FREESTYLE LITE) test strip Check fasting blood sugar every morning and before meals up to 4 times a day as directed. 100 strip 12   hydrOXYzine  (ATARAX ) 25 MG tablet Take 1 tablet (25 mg total) by mouth 3 (three) times daily as needed for anxiety. 270 tablet 1   Insulin  Pen Needle (UNIFINE PENTIPS) 31G X 5 MM MISC Use as directed to inject insulin  100 each 3   lamoTRIgine  (LAMICTAL ) 200 MG tablet Take 2 tablets (400 mg total) by mouth at bedtime.     Lancets 30G MISC Check fasting blood sugar every morning and before meals up to 4 times daily. 100 each 12   lidocaine  (LIDODERM ) 5 % Place 1 patch onto the skin every 12 (twelve) hours. Remove & Discard patch within 12 hours or as directed by MD 30 patch 0   lidocaine  (XYLOCAINE ) 5 % ointment Apply topically to affected areas 3 times  daily as needed. 35.44 g 0   lisinopril  (ZESTRIL ) 5 MG tablet Take 1 tablet (5 mg total) by mouth daily. 90 tablet 2   meloxicam  (MOBIC ) 15 MG tablet Take 1 tablet by mouth once every morning with a meal for 2 weeks, then daily as needed for pain. 30 tablet 0   Menthol , Topical Analgesic, (BENGAY EX) Apply 1 Application topically daily as needed (pain).     methylPREDNISolone  (MEDROL  DOSEPAK) 4 MG TBPK tablet Take by mouth 6 tabs on day 1, 5 tabs on day 2, 4 tabs on day 3, 3 tabs on day  4, 2 tabs on day 5,  1 tab on day 6. 21 tablet 1   mycophenolate  (CELLCEPT ) 500 MG tablet Take 2 tablets (1,000mg ) twice per day; if you are tolerating it well after 2 weeks and labs look good increase to 3 tablets (1,500mg ) twice per day 120 tablet 2   neomycin-bacitracin -polymyxin [phone] OINT Apply topically 2 (two) times daily for 10 days To corners of mouth 14 g 2   nystatin  (NYAMYC ) powder Apply 3 times daily as directed. 60 g 3   nystatin  ointment (MYCOSTATIN ) Apply to the skin 2 times daily to corners of mouth 15 g 2   ondansetron  (ZOFRAN -ODT) 4 MG disintegrating tablet Dissolve 1 tablet (4 mg total) by mouth 2 (two) times daily as needed for nausea or vomiting. 20 tablet 1   pantoprazole  (PROTONIX ) 40 MG tablet Take 2 tablets (80 mg total) by mouth 2 (two) times daily. 60 tablet 3   pimecrolimus  (ELIDEL ) 1 % cream Apply a small amount to affected area 2 x daily. Gently pat area dry w/cotton gauze. No eating, drinking or speaking for 20 - 30 minutes to allow medicine to be absorbed. Can apply to 2x2 gauze & placed against the affected area. 60 g 3   pregabalin  (LYRICA ) 75 MG capsule Take 1 capsule (75 mg total) by mouth in the morning AND 1 capsule (75 mg total) daily at 12 noon AND 2 capsules (150 mg total) at bedtime. 120 capsule 5   risperiDONE  (RISPERDAL ) 2 MG tablet Take 1 tablet (2 mg total) by mouth at bedtime. 90 tablet 1   rosuvastatin  (CRESTOR ) 20 MG tablet Take 1 tablet (20 mg total) by mouth  daily. 90 tablet 3   sertraline  (ZOLOFT ) 100 MG tablet Take 2 tablets (200 mg total) by mouth every morning. 180 tablet 3   spironolactone  (ALDACTONE ) 50 MG tablet Take 1 tablet (50 mg total) by mouth 2 (two) times daily. 180 tablet 2   SUMAtriptan  (IMITREX ) 100 MG tablet Take 1 tablet (100 mg total) by mouth every 2 (two) hours as needed for migraine. May repeat in 2 hours if headache persists or recurs. 10 tablet 11   SYNTHROID  200 MCG tablet Take 1 tablet (200 mcg total) by mouth daily before breakfast. 90 tablet 1   tacrolimus  (PROGRAF ) 1 MG capsule Open two capsules and dissolve in (0.5 liters) of water . Label well and keep in fridge. Swish and spit 10mL twice a day. 30 capsule 2   tirzepatide  (MOUNJARO ) 12.5 MG/0.5ML Pen Inject 12.5 mg into the skin once a week. 6 mL 2   traMADol  (ULTRAM ) 50 MG tablet Take 1 tablet (50 mg total) by mouth daily as needed for moderate pain (pain score 4-6). 30 tablet 0   traZODone  (DESYREL ) 100 MG tablet Take 1 tablet (100 mg total) by mouth at bedtime.     AMBULATORY NON FORMULARY MEDICATION Rollator for use daily. Elevated toilet seat with handlebars 1 each 0   Atogepant  (QULIPTA ) 60 MG TABS Take 1 tablet (60 mg total) by mouth daily. 30 tablet 11   potassium chloride  (MICRO-K ) 10 MEQ CR capsule Take 2 capsules (20 mEq total) by mouth daily for 5 days. 10 capsule 0   Current Facility-Administered Medications on File Prior to Visit  Medication Dose Route Frequency Provider Last Rate Last Admin   botulinum toxin Type A  (BOTOX ) injection 155 Units  155 Units Intramuscular Once Onita Duos, MD       Past Medical History:  Diagnosis Date  Acanthosis nigricans, acquired 02/09/2015   Allergy ?   Anemia    Anxiety and depression 10/31/2014   Arthritis    back, knees, right elbow   Arthritis of left hip 06/25/2018   Bilateral swelling of feet and ankles    Bipolar disorder (HCC)    Borderline personality disorder (HCC)    Cancer (HCC)    oral lichen  planus, uses mouth wash, cream   Carpal tunnel syndrome 09/06/2015   Chewing difficulty    Chronic left shoulder pain 05/17/2020   Constipation    Coronary artery disease 03/21/2021   COVID 10/08/2021   Cystic teratoma    BENIGN   Dental crowns present    Diabetes mellitus without complication (HCC)    Diabetes mellitus, type 2 (HCC) 06/10/2013   Diarrhea    Diverticulosis of colon without hemorrhage    DOE (dyspnea on exertion) 03/21/2021   Dyspepsia 12/14/2020   Essential hypertension 10/02/2007   Qualifier: Diagnosis of  By: Kassie MD, Sean A    Facial rash 06/16/2019   Female hirsutism 02/09/2015   Food allergy    Walnuts   GAD (generalized anxiety disorder)    Gastritis and gastroduodenitis    Gastroesophageal reflux disease    Grade II internal hemorrhoids    Greater trochanteric bursitis of left hip 07/24/2018   Hemorrhoids, internal 07/18/2017   History of kidney stones    History of migraine headaches    History of posttraumatic stress disorder (PTSD)    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/2015   Hyperlipidemia    Hypothyroidism    HYPOTHYROIDISM, POSTSURGICAL 10/02/2007   Qualifier: Diagnosis of  By: Kassie MD, Sean A    IBS (irritable bowel syndrome)    Infertility associated with anovulation 02/09/2015   Insulin  resistance 02/09/2015   Joint pain    Leg swelling 09/21/2020   Lichen planus    Liver cirrhosis secondary to NASH (nonalcoholic steatohepatitis) (HCC)    Lower back pain 07/18/2017   MDD (major depressive disorder) 06/06/2015   Migraine 10/02/2007   Morbid obesity with BMI of 40.0-44.9, adult (HCC) 06/23/2020   NASH (nonalcoholic steatohepatitis)    Nausea without vomiting    Nonalcoholic fatty liver disease 02/09/2015   Obstructive sleep apnea 06/27/2014   Oral mucosal lesion 06/17/2019   Osteoarthritis    Other fatigue    PCOS (polycystic ovarian syndrome)    Pituitary abnormality 01/01/2012   POLYCYSTIC OVARIAN DISEASE 10/02/2007   Qualifier:  Diagnosis of  By: Kassie MD, Alyce A    PONV (postoperative nausea and vomiting)    also hx. 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   Qualifier: Diagnosis of  By: Kassie MD, Sean A    Trigeminal neuralgia    Unilateral primary osteoarthritis, left hip 02/10/2019   Vitamin D  deficiency    Past Surgical History:  Procedure Laterality Date   ACHILLES TENDON SURGERY Left 2007   APPENDECTOMY     CARPAL TUNNEL RELEASE Right 01/12/2016   Procedure: RIGHT CARPAL TUNNEL  RELEASE;  Surgeon: Elsie Mussel, MD;  Location: [facility];  Service: Orthopedics;  Laterality: Right;   COLONOSCOPY WITH PROPOFOL  N/A 04/20/2014   Procedure: COLONOSCOPY WITH PROPOFOL ;  Surgeon: Elsie Cree, MD;  Location: [facility];  Service: Endoscopy;  Laterality: N/A;   COLONOSCOPY WITH PROPOFOL  N/A 01/10/2021   Procedure: COLONOSCOPY WITH PROPOFOL ;  Surgeon: San Sandor GAILS, DO;  Location: [facility];  Service: Gastroenterology;  Laterality: N/A;   ESOPHAGOGASTRODUODENOSCOPY (EGD) WITH PROPOFOL  N/A 04/20/2014   Procedure: ESOPHAGOGASTRODUODENOSCOPY (EGD) WITH PROPOFOL ;  Surgeon: Elsie Cree, MD;  Location: [facility];  Service: Endoscopy;  Laterality: N/A;   ESOPHAGOGASTRODUODENOSCOPY (EGD) WITH PROPOFOL  N/A 01/10/2021   Procedure: ESOPHAGOGASTRODUODENOSCOPY (EGD) WITH PROPOFOL ;  Surgeon: San Sandor GAILS, DO;  Location: [facility];  Service: Gastroenterology;  Laterality: N/A;   EXCISION HAGLUND'S DEFORMITY WITH ACHILLES TENDON REPAIR  Right 02/25/2013   Procedure: RIGHT ACHILLES DEBRIDEMENT AND RECONSTRUCTION;  HAGLUND'S EXCISION; GASTROC RECESSION AND FLEXOR HALLUCIS LONGUS TRANSFER;  Surgeon: Norleen Armor, MD;  Location: [facility];  Service: Orthopedics;  Laterality: Right;   IR ANGIOGRAM SELECTIVE EACH ADDITIONAL VESSEL  05/15/2023   IR ANGIOGRAM SELECTIVE EACH ADDITIONAL VESSEL  05/15/2023   IR ANGIOGRAM SELECTIVE EACH ADDITIONAL VESSEL  05/15/2023   IR ANGIOGRAM SELECTIVE EACH ADDITIONAL VESSEL  05/15/2023   IR EMBO ARTERIAL NOT HEMORR HEMANG INC GUIDE ROADMAPPING  05/15/2023   IR RADIOLOGIST EVAL & MGMT  04/16/2023   IR US  GUIDE VASC ACCESS LEFT  05/15/2023   JOINT REPLACEMENT     hip   LUMBAR LAMINECTOMY     X 3   neck fusion     OVARIAN CYST SURGERY Left 1998   PILONIDAL CYST EXCISION  1994   RIGHT OOPHORECTOMY     SHOULDER ARTHROSCOPY WITH ROTATOR CUFF REPAIR Right 10/17/2022   Procedure: Right shoulder arthroscopy, debridement, subacromial decompression, distal clavicle resection, rotator cuff repair;  Surgeon: Melita Drivers, MD;  Location: [facility];  Service: Orthopedics;  Laterality: Right;    SPINE SURGERY     STERIOD INJECTION Left 01/12/2016   Procedure: STEROID INJECTION LEFT WRIST;  Surgeon: Elsie Mussel, MD;  Location: [facility];  Service: Orthopedics;  Laterality: Left;   TOTAL HIP ARTHROPLASTY Left 03/19/2019   Procedure: LEFT TOTAL HIP ARTHROPLASTY ANTERIOR APPROACH;  Surgeon: Vernetta Lonni GRADE, MD;  Location: [facility];  Service: Orthopedics;  Laterality: Left;   TOTAL THYROIDECTOMY  2003   UPPER GI ENDOSCOPY  01/19/2015    Family History  Problem Relation Age of Onset   Heart disease Father    Depression Father    Heart attack Father    Sudden death Father    Anxiety disorder Father    Bipolar disorder Father    Obesity Father    Cancer Brother        THROAT cancer   Depression Brother    Anxiety disorder Brother    Depression Sister    Breast  cancer Mother    Hypertension Mother    Cancer Mother    Depression Mother    Sleep apnea Mother    Obesity Mother    Varicose Veins Mother    Depression Maternal Grandmother    Stroke Maternal Grandfather    Colon cancer Brother    Stroke Paternal Grandmother    Anxiety disorder Sister    Depression Sister    Hearing loss Sister    Diabetes Brother    Hearing loss Brother    Obesity Brother  Pancreatic cancer Neg Hx    Stomach cancer Neg Hx    Liver disease Neg Hx    Social History   Socioeconomic History   Marital status: Divorced    Spouse name: Not on file   Number of children: 0   Years of education: 16   Highest education level: Bachelor's degree (e.g., BA, AB, BS)  Occupational History   Occupation: RN-Telephone Triage Nurse    Employer: [employer]    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   Drug use: No   Sexual activity: Not Currently    Partners: Male    Birth control/protection: Post-menopausal    Comment: Divorced  Other Topics Concern   Not on file  Social History Narrative   RN - [place]   Regular exercise: no   Caffeine  use: 2 x daily   Right-handed.   Lives alone.   Social Drivers of Corporate investment banker Strain: Low Risk  (03/01/2024)   Overall Financial Resource Strain (CARDIA)    Difficulty of Paying Living Expenses: Not hard at all  Food Insecurity: No Food Insecurity (03/01/2024)   Hunger Vital Sign    Worried About Running Out of Food in the Last Year: Never true    Ran Out of Food in the  Last Year: Never true  Transportation Needs: No Transportation Needs (03/01/2024)   PRAPARE - Administrator, Civil Service (Medical): No    Lack of Transportation (Non-Medical): No  Physical Activity: Insufficiently Active (03/01/2024)   Exercise Vital Sign    Days of Exercise per Week: 1 day    Minutes of Exercise per Session: 10 min  Stress: Stress Concern Present (03/01/2024)   [employer] of Occupational Health - Occupational Stress Questionnaire    Feeling of Stress: Rather much  Social Connections: Moderately Isolated (03/01/2024)   Social Connection and Isolation Panel    Frequency of Communication with Friends and Family: More than three times a week    Frequency of Social Gatherings with Friends and Family: Once a week    Attends Religious Services: More than 4 times per year    Active Member of [employer] or Organizations: No    Attends Engineer, structural: Not on file    Marital Status: Divorced    Objective:  BP 96/60   Pulse 84   Temp (!) 97.4 F (36.3 C)   Resp 16   Ht 5' 11 (1.803 m)   Wt 277 lb (125.6 kg)   LMP 08/24/2020   SpO2 97%   BMI 38.63 kg/m      05/13/2024    3:50 PM 04/13/2024   10:45 AM 03/30/2024    1:24 PM  BP/Weight  Systolic BP 96 110 120  Diastolic BP 60 60 74  Wt. (Lbs) 277 274 277  BMI 38.63 kg/m2 38.22 kg/m2 38.63 kg/m2    Physical Exam Vitals and nursing note reviewed.  Constitutional:  Appearance: She is obese.  HENT:     Head: Normocephalic and atraumatic.  Cardiovascular:     Rate and Rhythm: Normal rate and regular rhythm.  Pulmonary:     Effort: Pulmonary effort is normal.     Breath sounds: Normal breath sounds.  Musculoskeletal:     Comments: : Right hip with normal range of motion, right trochanter bursa non-tender. Mild right groin and right buttock tenderness   Skin:    General: Skin is warm.     Comments: Redundant skin/ pannus noted in the lower abdominal wall  Neurological:      Mental Status: She is alert and oriented to person, place, and time.  Psychiatric:        Mood and Affect: Mood normal.         Lab Results  Component Value Date   WBC 7.9 03/03/2024   HGB 15.9 03/03/2024   HCT 50.0 (H) 03/03/2024   PLT 190 03/03/2024   GLUCOSE 92 03/03/2024   CHOL 290 (H) 03/03/2024   TRIG 221 (H) 03/03/2024   HDL 45 03/03/2024   LDLCALC 202 (H) 03/03/2024   ALT 18 03/03/2024   AST 18 03/03/2024   NA 136 03/03/2024   K 4.2 03/03/2024   CL 98 03/03/2024   CREATININE 1.07 (H) 03/03/2024   BUN 13 03/03/2024   CO2 20 03/03/2024   TSH 6.620 (H) 03/03/2024   INR 1.0 05/15/2023   HGBA1C 5.5 03/03/2024    Results for orders placed or performed in visit on 03/03/24  Comprehensive metabolic panel with GFR   Collection Time: 03/03/24  3:59 PM  Result Value Ref Range   Glucose 92 70 - 99 mg/dL   BUN 13 6 - 24 mg/dL   Creatinine, Ser 8.92 (H) 0.57 - 1.00 mg/dL   eGFR 61 >40 fO/fpw/8.26   BUN/Creatinine Ratio 12 9 - 23   Sodium 136 134 - 144 mmol/L   Potassium 4.2 3.5 - 5.2 mmol/L   Chloride 98 96 - 106 mmol/L   CO2 20 20 - 29 mmol/L   Calcium  9.8 8.7 - 10.2 mg/dL   Total Protein 7.2 6.0 - 8.5 g/dL   Albumin 4.4 3.8 - 4.9 g/dL   Globulin, Total 2.8 1.5 - 4.5 g/dL   Bilirubin Total 0.4 0.0 - 1.2 mg/dL   Alkaline Phosphatase 147 (H) 44 - 121 IU/L   AST 18 0 - 40 IU/L   ALT 18 0 - 32 IU/L  CBC with Differential   Collection Time: 03/03/24  3:59 PM  Result Value Ref Range   WBC 7.9 3.4 - 10.8 x10E3/uL   RBC 5.37 (H) 3.77 - 5.28 x10E6/uL   Hemoglobin 15.9 11.1 - 15.9 g/dL   Hematocrit 49.9 (H) 65.9 - 46.6 %   MCV 93 79 - 97 fL   MCH 29.6 26.6 - 33.0 pg   MCHC 31.8 31.5 - 35.7 g/dL   RDW 85.4 88.2 - 84.5 %   Platelets 190 150 - 450 x10E3/uL   Neutrophils 65 Not Estab. %   Lymphs 28 Not Estab. %   Monocytes 6 Not Estab. %   Eos 1 Not Estab. %   Basos 0 Not Estab. %   Neutrophils Absolute 5.1 1.4 - 7.0 x10E3/uL   Lymphocytes Absolute 2.2 0.7 - 3.1  x10E3/uL   Monocytes Absolute 0.5 0.1 - 0.9 x10E3/uL   EOS (ABSOLUTE) 0.1 0.0 - 0.4 x10E3/uL   Basophils Absolute 0.0 0.0 - 0.2 x10E3/uL   Immature Granulocytes  0 Not Estab. %   Immature Grans (Abs) 0.0 0.0 - 0.1 x10E3/uL  Lipid Panel   Collection Time: 03/03/24  3:59 PM  Result Value Ref Range   Cholesterol, Total 290 (H) 100 - 199 mg/dL   Triglycerides 778 (H) 0 - 149 mg/dL   HDL 45 >60 mg/dL   VLDL Cholesterol Cal 43 (H) 5 - 40 mg/dL   LDL Chol Calc (NIH) 797 (H) 0 - 99 mg/dL   LDL CALC COMMENT: Comment    Chol/HDL Ratio 6.4 (H) 0.0 - 4.4 ratio  Vitamin D , 25-hydroxy   Collection Time: 03/03/24  3:59 PM  Result Value Ref Range   Vit D, 25-Hydroxy 28.8 (L) 30.0 - 100.0 ng/mL  TSH   Collection Time: 03/03/24  3:59 PM  Result Value Ref Range   TSH 6.620 (H) 0.450 - 4.500 uIU/mL  Hemoglobin A1c   Collection Time: 03/03/24  3:59 PM  Result Value Ref Range   Hgb A1c MFr Bld 5.5 4.8 - 5.6 %   Est. average glucose Bld gHb Est-mCnc 111 mg/dL  Hepatitis C antibody   Collection Time: 03/03/24  3:59 PM  Result Value Ref Range   Hep C Virus Ab Non Reactive Non Reactive   *Note: Due to a large number of results and/or encounters for the requested time period, some results have not been displayed. A complete set of results can be found in Results Review.  .  Assessment & Plan:   Assessment & Plan Acute right hip pain Right hip pain, rule out osteoarthritis Right hip pain for two and a half weeks, located on the outer side of the right hip, extending to the mid-upper thigh. Pain is severe, constant, and disrupts sleep. Previous imaging from 2021 showed some joint space narrowing. Differential diagnosis includes osteoarthritis and soft tissue issues. Examination shows good range of motion and no tenderness over the trochanter bursa, making bursitis unlikely. No signs of sciatica or shingles. Pain may be exacerbated by favoring the left leg due to left knee osteoarthritis. - Order right  hip x-ray to evaluate for osteoarthritis - Advise contacting orthopedic office for potential ultrasound-guided steroid injection if x-ray shows arthritis - Advise against strong analgesics - Encourage continuation of conservative measures such as ice, massage, and range of motion exercises Orders:   DG Hip Unilat W OR W/O Pelvis Min 4 Views Right; Future  Chronic pain of left knee Left knee osteoarthritis with scheduled knee replacement in December. No current exacerbation of symptoms reported.      Body mass index is 38.63 kg/m.    Weight management - managed with Mounjaro  therapy. Weight has remained stable since August. Considering increasing the dose to 15 mg after completing the current doses. - Continue Mounjaro  therapy - Consider increasing Mounjaro  dose to 15 mg after current doses are completed          No orders of the defined types were placed in this encounter.   No orders of the defined types were placed in this encounter.      Follow-up: Return in about 8 weeks (around 07/08/2024) for chronic disease follow up.  An After Visit Summary was printed and given to the patient.  Uchechi Denison, MD [facility] [phone]

## 2024-05-13 NOTE — Assessment & Plan Note (Signed)
 Right hip pain, rule out osteoarthritis Right hip pain for two and a half weeks, located on the outer side of the right hip, extending to the mid-upper thigh. Pain is severe, constant, and disrupts sleep. Previous imaging from 2021 showed some joint space narrowing. Differential diagnosis includes osteoarthritis and soft tissue issues. Examination shows good range of motion and no tenderness over the trochanter bursa, making bursitis unlikely. No signs of sciatica or shingles. Pain may be exacerbated by favoring the left leg due to left knee osteoarthritis. - Order right hip x-ray to evaluate for osteoarthritis - Advise contacting orthopedic office for potential ultrasound-guided steroid injection if x-ray shows arthritis - Advise against strong analgesics - Encourage continuation of conservative measures such as ice, massage, and range of motion exercises Orders:   DG Hip Unilat W OR W/O Pelvis Min 4 Views Right; Future

## 2024-05-14 ENCOUNTER — Other Ambulatory Visit (HOSPITAL_BASED_OUTPATIENT_CLINIC_OR_DEPARTMENT_OTHER): Payer: Self-pay

## 2024-05-14 ENCOUNTER — Telehealth (INDEPENDENT_AMBULATORY_CARE_PROVIDER_SITE_OTHER): Admitting: Adult Health

## 2024-05-14 ENCOUNTER — Encounter: Payer: Self-pay | Admitting: Adult Health

## 2024-05-14 DIAGNOSIS — F411 Generalized anxiety disorder: Secondary | ICD-10-CM | POA: Diagnosis not present

## 2024-05-14 DIAGNOSIS — F410 Panic disorder [episodic paroxysmal anxiety] without agoraphobia: Secondary | ICD-10-CM | POA: Diagnosis not present

## 2024-05-14 DIAGNOSIS — F4310 Post-traumatic stress disorder, unspecified: Secondary | ICD-10-CM | POA: Diagnosis not present

## 2024-05-14 DIAGNOSIS — F332 Major depressive disorder, recurrent severe without psychotic features: Secondary | ICD-10-CM

## 2024-05-14 DIAGNOSIS — F329 Major depressive disorder, single episode, unspecified: Secondary | ICD-10-CM | POA: Diagnosis not present

## 2024-05-14 MED ORDER — RISPERIDONE 0.5 MG PO TABS
0.5000 mg | ORAL_TABLET | Freq: Every day | ORAL | 2 refills | Status: DC
  Filled 2024-05-14: qty 30, 30d supply, fill #0
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
## 2024-05-17 ENCOUNTER — Telehealth (HOSPITAL_COMMUNITY): Payer: Self-pay | Admitting: Licensed Clinical Social Worker

## 2024-05-17 ENCOUNTER — Other Ambulatory Visit (HOSPITAL_BASED_OUTPATIENT_CLINIC_OR_DEPARTMENT_OTHER): Payer: Self-pay

## 2024-05-17 ENCOUNTER — Ambulatory Visit

## 2024-05-17 ENCOUNTER — Ambulatory Visit: Payer: Self-pay

## 2024-05-17 ENCOUNTER — Other Ambulatory Visit (HOSPITAL_COMMUNITY): Payer: Self-pay

## 2024-05-17 ENCOUNTER — Other Ambulatory Visit: Payer: Self-pay

## 2024-05-17 VITALS — BP 128/86 | HR 74 | Temp 98.0°F | Ht 71.0 in | Wt 274.6 lb

## 2024-05-17 DIAGNOSIS — M25551 Pain in right hip: Secondary | ICD-10-CM | POA: Diagnosis not present

## 2024-05-17 MED ORDER — KETOROLAC TROMETHAMINE 60 MG/2ML IM SOLN
60.0000 mg | Freq: Once | INTRAMUSCULAR | Status: AC
  Administered 2024-05-17: 60 mg via INTRAMUSCULAR

## 2024-05-17 NOTE — Telephone Encounter (Signed)
Cln spoke to pt about referral from [facility] provider to IOP. Pt states she was aware the referral was made and is interested in anything that can help. Pt reports she is in a dark hole and was already taken out of work due to symptoms. Pt reports physical pain and depression symptoms and denies AVH and SI/HI.  Pt states she has been in IOP a couple times and at least once with [facility]. Cln oriented pt to virtual IOP and pt requests to join. Cln offered start date of WED 10/22 and pt accepted.  Cln educated pt that group will meet through Teams and pt will receive email invite the day before she is scheduled to begin. Pt provided email address of: [email or web address] and cln read back to confirm. Cln informed pt she can expect a call from CM, [place], next week

## 2024-05-17 NOTE — Patient Instructions (Addendum)
  VISIT SUMMARY: Today, you were seen for severe right hip pain that has been ongoing for about two and a half weeks. We discussed your pain management, ordered further tests, and addressed a burn on your buttock from a heating pad. We also considered your mental health status and its relation to your pain.  YOUR PLAN: RIGHT HIP OSTEOARTHRITIS WITH SEVERE PAIN: You have severe, constant pain in your right hip that extends to your mid-upper thigh and groin. This pain has not been relieved by high doses of ibuprofen  and acetaminophen . An x-ray indicates arthritis in your right hip joint. -We have ordered an MRI of your right hip to get a clearer picture of what might be causing your pain. -You received a 60 mg Toradol  injection to help manage your pain. -We have ordered blood work to check for markers of inflammation. -Please follow up with the orthopedic office for further evaluation and possibly a bedside ultrasound. -We are waiting for the official x-ray report to provide more information.  MAJOR DEPRESSIVE DISORDER: You are currently on leave from work for three months due to depression. Managing your pain is important to help improve your mental health. -We are prioritizing pain management to help improve your overall mental health status.  BURN OF BUTTOCK: You have a burn on your buttock from using a heating pad, which has resulted in blistering. -Keep the burn area bandaged to protect it while it heals.                      Contains text generated by [employer].                                 Contains text generated by [employer].                         Contains text generated by [employer].                                 Contains text generated by [employer].
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
## 2024-05-17 NOTE — Assessment & Plan Note (Addendum)
 Assessment and Plan   Right hip osteoarthritis with severe pain Severe, constant pain in the right hip, extending to the mid-upper thigh and groin, unrelieved by high doses of ibuprofen  and acetaminophen . X-ray indicates arthritis in the right hip joint. Differential diagnosis includes severe arthritis, soft tissue injury or possible infection or other inflammatory causes. No signs of blood clot or swelling. Pain worsens with certain positions and activities. - X RAY READING PENDING. Will have it read. If needed, will Order MRI of the right hip - Administer 60 mg IM Toradol  injection - Order blood work to check markers of inflammation - Advise follow-up with orthopedic office for evaluation and possible bedside ultrasound - Await official x-ray report   Currently on leave from work for three months due to mental health. Pain management is prioritized to improve mental health status.  Burn of buttock Burn on the buttock due to heating pad use, resulting in blistering. Currently bandaged.       Orders:   CBC with Differential/Platelet   Sedimentation rate   C-reactive protein

## 2024-05-18 ENCOUNTER — Ambulatory Visit: Payer: Self-pay

## 2024-05-18 ENCOUNTER — Telehealth (HOSPITAL_COMMUNITY): Payer: Self-pay | Admitting: Psychiatry

## 2024-05-18 ENCOUNTER — Other Ambulatory Visit: Payer: Self-pay

## 2024-05-18 LAB — CBC WITH DIFFERENTIAL/PLATELET
Basophils Absolute: 0 x10E3/uL (ref 0.0–0.2)
Basos: 1 %
EOS (ABSOLUTE): 0.1 x10E3/uL (ref 0.0–0.4)
Eos: 2 %
Hematocrit: 39 % (ref 34.0–46.6)
Hemoglobin: 12.7 g/dL (ref 11.1–15.9)
Immature Grans (Abs): 0 x10E3/uL (ref 0.0–0.1)
Immature Granulocytes: 0 %
Lymphocytes Absolute: 1.5 x10E3/uL (ref 0.7–3.1)
Lymphs: 37 %
MCH: 29.8 pg (ref 26.6–33.0)
MCHC: 32.6 g/dL (ref 31.5–35.7)
MCV: 92 fL (ref 79–97)
Monocytes Absolute: 0.4 x10E3/uL (ref 0.1–0.9)
Monocytes: 9 %
Neutrophils Absolute: 2.1 x10E3/uL (ref 1.4–7.0)
Neutrophils: 51 %
Platelets: 169 x10E3/uL (ref 150–450)
RBC: 4.26 x10E6/uL (ref 3.77–5.28)
RDW: 14.8 % (ref 11.7–15.4)
WBC: 4.1 x10E3/uL (ref 3.4–10.8)

## 2024-05-18 LAB — C-REACTIVE PROTEIN: CRP: <1 mg/L (ref 0–10)

## 2024-05-18 LAB — SEDIMENTATION RATE: Sed Rate: 13 mm/h (ref 0–40)

## 2024-05-18 NOTE — Telephone Encounter (Signed)
 D:  Placed call to schedule a CCA with pt.  A:  CCA scheduled for 05-25-24 @ 9:30 a.m.(virtual).  Will start MH-IOP on 05-26-24.  R:  Pt receptive.

## 2024-05-19 ENCOUNTER — Telehealth: Payer: Self-pay | Admitting: Adult Health

## 2024-05-19 ENCOUNTER — Other Ambulatory Visit (HOSPITAL_COMMUNITY): Payer: Self-pay

## 2024-05-19 ENCOUNTER — Other Ambulatory Visit (HOSPITAL_BASED_OUTPATIENT_CLINIC_OR_DEPARTMENT_OTHER): Payer: Self-pay

## 2024-05-19 DIAGNOSIS — M5416 Radiculopathy, lumbar region: Secondary | ICD-10-CM | POA: Diagnosis not present

## 2024-05-19 DIAGNOSIS — Z0289 Encounter for other administrative examinations: Secondary | ICD-10-CM

## 2024-05-19 DIAGNOSIS — M25551 Pain in right hip: Secondary | ICD-10-CM | POA: Diagnosis not present

## 2024-05-19 MED ORDER — PREDNISONE 5 MG PO TABS
ORAL_TABLET | ORAL | 1 refills | Status: AC
  Filled 2024-05-19 (×2): qty 48, 12d supply, fill #0

## 2024-05-19 NOTE — Telephone Encounter (Signed)
 Received payment for [employer] & [employer]. Total $90 for both.

## 2024-05-19 NOTE — Telephone Encounter (Signed)
 Received fax from [facility] forms to complete. Put in [name]'s box

## 2024-05-20 ENCOUNTER — Encounter (HOSPITAL_COMMUNITY): Payer: Self-pay

## 2024-05-20 ENCOUNTER — Other Ambulatory Visit (HOSPITAL_COMMUNITY): Payer: Self-pay

## 2024-05-20 DIAGNOSIS — F3181 Bipolar II disorder: Secondary | ICD-10-CM | POA: Diagnosis not present

## 2024-05-20 MED ORDER — PREDNISONE 5 MG (21) PO TBPK
5.0000 mg | ORAL_TABLET | ORAL | 1 refills | Status: DC
  Filled 2024-05-20: qty 63, 12d supply, fill #0

## 2024-05-21 ENCOUNTER — Other Ambulatory Visit (HOSPITAL_BASED_OUTPATIENT_CLINIC_OR_DEPARTMENT_OTHER): Payer: Self-pay

## 2024-05-21 ENCOUNTER — Other Ambulatory Visit: Payer: Self-pay

## 2024-05-21 ENCOUNTER — Other Ambulatory Visit (HOSPITAL_COMMUNITY): Payer: Self-pay

## 2024-05-21 DIAGNOSIS — M170 Bilateral primary osteoarthritis of knee: Secondary | ICD-10-CM

## 2024-05-21 MED ORDER — DIAZEPAM 5 MG PO TABS
5.0000 mg | ORAL_TABLET | ORAL | 0 refills | Status: DC
  Filled 2024-05-21: qty 2, 1d supply, fill #0

## 2024-05-22 MED ORDER — TRAMADOL HCL 50 MG PO TABS: 50.0000 mg | ORAL_TABLET | Freq: Every day | ORAL | 0 refills | Status: AC | PRN

## 2024-05-23 NOTE — Telephone Encounter (Signed)
 Paper work completed.

## 2024-05-24 ENCOUNTER — Other Ambulatory Visit: Payer: Self-pay

## 2024-05-24 ENCOUNTER — Other Ambulatory Visit: Payer: Self-pay | Admitting: Family Medicine

## 2024-05-24 ENCOUNTER — Other Ambulatory Visit (HOSPITAL_COMMUNITY): Payer: Self-pay

## 2024-05-24 MED ORDER — PANTOPRAZOLE SODIUM 40 MG PO TBEC
80.0000 mg | DELAYED_RELEASE_TABLET | Freq: Two times a day (BID) | ORAL | 3 refills | Status: DC
  Filled 2024-05-24: qty 60, 15d supply, fill #0
  Filled 2024-06-08: qty 60, 15d supply, fill #1
  Filled 2024-06-20: qty 60, 15d supply, fill #2
  Filled 2024-07-07: qty 60, 15d supply, fill #3

## 2024-05-25 ENCOUNTER — Other Ambulatory Visit (HOSPITAL_COMMUNITY): Admitting: Psychiatry

## 2024-05-25 ENCOUNTER — Encounter (HOSPITAL_COMMUNITY): Payer: Self-pay

## 2024-05-25 ENCOUNTER — Other Ambulatory Visit: Payer: Self-pay

## 2024-05-25 ENCOUNTER — Other Ambulatory Visit (HOSPITAL_COMMUNITY): Payer: Self-pay

## 2024-05-25 MED ORDER — TIRZEPATIDE 15 MG/0.5ML ~~LOC~~ SOAJ
15.0000 mg | SUBCUTANEOUS | 2 refills | Status: AC
  Filled 2024-05-25: qty 2, 28d supply, fill #0
  Filled 2024-07-01: qty 2, 28d supply, fill #1
  Filled 2024-08-04: qty 6, 84d supply, fill #2
  Filled 2024-08-13: qty 2, 28d supply, fill #2
  Filled 2024-09-08: qty 2, 28d supply, fill #3

## 2024-05-25 MED ORDER — MYCOPHENOLATE MOFETIL 500 MG PO TABS
1500.0000 mg | ORAL_TABLET | Freq: Two times a day (BID) | ORAL | 0 refills | Status: AC
  Filled 2024-05-25: qty 540, 90d supply, fill #0

## 2024-05-26 ENCOUNTER — Telehealth: Payer: Self-pay | Admitting: Adult Health

## 2024-05-26 ENCOUNTER — Other Ambulatory Visit (HOSPITAL_COMMUNITY)

## 2024-05-26 NOTE — Telephone Encounter (Signed)
 Please see message from patient.  Seen 10/10:  Increase therapy visits with [facility]  Refer to IOP program at [facility].   Will take patient out of work 05/12/2024 through 08/12/2024.    RTC 2 weeks

## 2024-05-26 NOTE — Telephone Encounter (Signed)
 Pt called reporting intensive out pt therapy cost $600. Can't afford. Need alternative. [phone]

## 2024-05-26 NOTE — Telephone Encounter (Signed)
 Paperwork faxed

## 2024-05-27 ENCOUNTER — Other Ambulatory Visit (HOSPITAL_COMMUNITY)

## 2024-05-27 DIAGNOSIS — M5416 Radiculopathy, lumbar region: Secondary | ICD-10-CM | POA: Diagnosis not present

## 2024-05-27 NOTE — Telephone Encounter (Signed)
 Recommendations provided to the patient. She said that she would probably see her therapist more often.

## 2024-05-28 ENCOUNTER — Other Ambulatory Visit (HOSPITAL_COMMUNITY)

## 2024-05-31 ENCOUNTER — Other Ambulatory Visit (HOSPITAL_COMMUNITY)

## 2024-05-31 ENCOUNTER — Other Ambulatory Visit (HOSPITAL_COMMUNITY): Payer: Self-pay

## 2024-05-31 ENCOUNTER — Encounter: Payer: Self-pay | Admitting: Adult Health

## 2024-05-31 ENCOUNTER — Other Ambulatory Visit: Payer: Self-pay

## 2024-05-31 ENCOUNTER — Telehealth: Admitting: Adult Health

## 2024-05-31 DIAGNOSIS — F410 Panic disorder [episodic paroxysmal anxiety] without agoraphobia: Secondary | ICD-10-CM

## 2024-05-31 DIAGNOSIS — F329 Major depressive disorder, single episode, unspecified: Secondary | ICD-10-CM | POA: Diagnosis not present

## 2024-05-31 DIAGNOSIS — F4310 Post-traumatic stress disorder, unspecified: Secondary | ICD-10-CM

## 2024-05-31 DIAGNOSIS — F411 Generalized anxiety disorder: Secondary | ICD-10-CM | POA: Diagnosis not present

## 2024-05-31 DIAGNOSIS — F332 Major depressive disorder, recurrent severe without psychotic features: Secondary | ICD-10-CM

## 2024-05-31 MED ORDER — RISPERIDONE 1 MG PO TABS
1.0000 mg | ORAL_TABLET | Freq: Every day | ORAL | 2 refills | Status: DC
  Filled 2024-05-31: qty 30, 30d supply, fill #0
  Filled 2024-06-20: qty 30, 30d supply, fill #1

## 2024-05-31 NOTE — Progress Notes (Signed)
 Tracy [name] [id number] [birth year] 58 y.o.  Virtual Visit via Video Note  I connected with pt @ on 05/31/24 at  4:30 PM EDT by a video enabled telemedicine application and verified that I am speaking with the correct person using two identifiers.   I discussed the limitations of evaluation and management by telemedicine and the availability of in person appointments. The patient expressed understanding and agreed to proceed.  I discussed the assessment and treatment plan with the patient. The patient was provided an opportunity to ask questions and all were answered. The patient agreed with the plan and demonstrated an understanding of the instructions.   The patient was advised to call back or seek an in-person evaluation if the symptoms worsen or if the condition fails to improve as anticipated.  I provided 25 minutes of non-face-to-face time during this encounter.  The patient was located at home.  The provider was located at [facility].   Tracy LOISE Sayers, NP   Subjective:   Patient ID:  Tracy [name] is a 58 y.o. (DOB 03/06/1966) female.  Chief Complaint: No chief complaint on file.   HPI Tracy [name] presents for follow-up of MDD, GAD, PTSD, and panic attacks.  Describes mood today as about the same. Flat. Reports tearfulness. Mood symptoms - reports ongoing depression, anxiety and irritability. Reports decreased interest and motivation - I have to make myself do things - not completing self care. Reports isolating behaviors - not wanting to leave the house. Denies recent panic attacks. Reports increased worry, rumination, and over thinking. Reports intrusive thoughts  Reports flashbacks from childhood. Reports increased stressors in the work setting - has taken time off from work until mood is stabilized. Reports increased back pain - recent MRI was abnormal - pain is moderate to severe. Also reports chronic pain left knee - surgery in December. Reports mood  remains lower. Stating I feel like I'm trying to do everything I can - I need some stability. Feels like the Risperdal  in the morning has been a little helpful for anxiety - willing to increase dose. Taking current medications as prescribed. Seeing therapist regularly. Energy levels lower. Active, does not have a regular exercise routine. Reports neighbor taking dogs out. Reports difficulties enjoying usual interests and activities. Lives alone with 3 dogs. Divorced. Siblings local. Spending time with family. Attends church. Appetite decreased - eating once a day. Weight loss - 273 from 277 pounds.  Reports difficulties with sleep. Averages 2 to 3 hours. Sleep apnea - using CPAP machine - taking Nuvigil  200mg  in the morning.  Denies SI or HI. Denies AH. Denies VH.  Denies self harm. Denies substance use.  Working with Donny Payor - therapy.  Previous medication trials:  Antipsychotics: Vraylar-TD, Latuda, Abilify, Geodon, Rexulti, Lybalvi, Risperdal   Mood Stabilizers - Depakote, Lithium, Lamictal , Equetro, Topamax , Trileptal, Gabapentin   SSRI - Zoloft , Lexapro, Celexa, Prozac , Viibryd, Trintellix , Prozac   SNRI - Pristiq, Effexor , Cymbalta   Wellbutrin - allergic reaction  Anti-anxiety - Buspar, Xanax , Clonazepam , Ativan, Valium  Sleep agents - Trazadone, Ambien , Restoril   Other - Deplin, Emsam, Serzone, Doxepin, Nuvigil , Vistaril   Previous treatment: ECT, Spravato  and TMS x 2   Review of Systems:  Review of Systems  Musculoskeletal:  Negative for gait problem.  Neurological:  Negative for tremors.  Psychiatric/Behavioral:         Please refer to HPI    Medications: I have reviewed the patient's current medications.  Current Outpatient Medications  Medication Sig Dispense Refill  albuterol  (VENTOLIN  HFA) 108 (90 Base) MCG/ACT inhaler Inhale 2 puffs into the lungs every 6 (six) hours as needed for wheezing or shortness of breath. 8 g 2   AMBULATORY NON FORMULARY  MEDICATION Rollator for use daily. Elevated toilet seat with handlebars 1 each 0   Armodafinil  200 MG TABS Take 1 tablet (200 mg total) by mouth daily as needed (sleepiness). 30 tablet 2   Atogepant  (QULIPTA ) 60 MG TABS Take 1 tablet (60 mg total) by mouth daily. 30 tablet 11   botulinum toxin Type A  (BOTOX ) 200 units injection Inject 200 Units into the muscle every 3 (three) months. 1 each 2   clobetasol  ointment (TEMOVATE ) 0.05 % Apply topically 2 (two) times daily. 60 g 6   clonazePAM  (KLONOPIN ) 1 MG tablet Take 1 tablet (1 mg total) by mouth 3 (three) times daily. 90 tablet 2   Continuous Glucose Sensor (DEXCOM G7 SENSOR) MISC Use to check glucose throughout the day.  Change every 10 days 9 each 1   cyclobenzaprine  (FLEXERIL ) 10 MG tablet Take 1 tablet (10 mg total) by mouth 3 (three) times daily as needed for muscle spasms. 30 tablet 1   diazepam (VALIUM) 5 MG tablet Take 1 tablet (5 mg total) by mouth 30 (thirty) minutes before MRI and repeat if necessary. 2 tablet 0   fluconazole  (DIFLUCAN ) 150 MG tablet Take 2 tablets (300mg )  today and then 1 tablet (150mg ) for each of the following three days. 5 tablet 0   furosemide  (LASIX ) 40 MG tablet Take 1 tablet (40 mg total) by mouth daily. 90 tablet 0   glucose blood (FREESTYLE LITE) test strip Check fasting blood sugar every morning and before meals up to 4 times a day as directed. 100 strip 12   hydrOXYzine  (ATARAX ) 25 MG tablet Take 1 tablet (25 mg total) by mouth 3 (three) times daily as needed for anxiety. 270 tablet 1   Insulin  Pen Needle (UNIFINE PENTIPS) 31G X 5 MM MISC Use as directed to inject insulin  100 each 3   lamoTRIgine  (LAMICTAL ) 200 MG tablet Take 2 tablets (400 mg total) by mouth at bedtime.     Lancets 30G MISC Check fasting blood sugar every morning and before meals up to 4 times daily. 100 each 12   lidocaine  (LIDODERM ) 5 % Place 1 patch onto the skin every 12 (twelve) hours. Remove & Discard patch within 12 hours or as  directed by MD 30 patch 0   lidocaine  (XYLOCAINE ) 5 % ointment Apply topically to affected areas 3 times daily as needed. 35.44 g 0   lisinopril  (ZESTRIL ) 5 MG tablet Take 1 tablet (5 mg total) by mouth daily. 90 tablet 2   meloxicam  (MOBIC ) 15 MG tablet Take 1 tablet by mouth once every morning with a meal for 2 weeks, then daily as needed for pain. 30 tablet 0   Menthol , Topical Analgesic, (BENGAY EX) Apply 1 Application topically daily as needed (pain).     methylPREDNISolone  (MEDROL  DOSEPAK) 4 MG TBPK tablet Take by mouth 6 tabs on day 1, 5 tabs on day 2, 4 tabs on day 3, 3 tabs on day 4, 2 tabs on day 5,  1 tab on day 6. 21 tablet 1   mycophenolate  (CELLCEPT ) 500 MG tablet Take 2 tablets (1,000mg ) twice per day; if you are tolerating it well after 2 weeks and labs look good increase to 3 tablets (1,500mg ) twice per day 120 tablet 2   mycophenolate  (CELLCEPT ) 500 MG tablet Take  3 tablets (1,500 mg total) by mouth every 12 (twelve) hours. 540 tablet 0   neomycin-bacitracin -polymyxin [phone] OINT Apply topically 2 (two) times daily for 10 days To corners of mouth 14 g 2   nystatin  (NYAMYC ) powder Apply 3 times daily as directed. 60 g 3   nystatin  ointment (MYCOSTATIN ) Apply to the skin 2 times daily to corners of mouth 15 g 2   ondansetron  (ZOFRAN -ODT) 4 MG disintegrating tablet Dissolve 1 tablet (4 mg total) by mouth 2 (two) times daily as needed for nausea or vomiting. 20 tablet 1   pantoprazole  (PROTONIX ) 40 MG tablet Take 2 tablets (80 mg total) by mouth 2 (two) times daily. 60 tablet 3   pimecrolimus  (ELIDEL ) 1 % cream Apply a small amount to affected area 2 x daily. Gently pat area dry w/cotton gauze. No eating, drinking or speaking for 20 - 30 minutes to allow medicine to be absorbed. Can apply to 2x2 gauze & placed against the affected area. 60 g 3   potassium chloride  (MICRO-K ) 10 MEQ CR capsule Take 2 capsules (20 mEq total) by mouth daily for 5 days. 10 capsule 0   predniSONE   (STERAPRED UNI-PAK 21 TAB) 5 MG (21) TBPK tablet Take 1 tablet (5 mg total) by mouth as directed for 12 days 48 tablet 1   predniSONE  (DELTASONE ) 5 MG tablet Take 6 tablets (30 mg total) by mouth daily for 4 days, THEN 4 tablets (20 mg total) daily for 4 days, THEN 2 tablets (10 mg total) daily for 4 days. Avoid NSAIDS while on this medication. 48 tablet 1   pregabalin  (LYRICA ) 75 MG capsule Take 1 capsule (75 mg total) by mouth in the morning AND 1 capsule (75 mg total) daily at 12 noon AND 2 capsules (150 mg total) at bedtime. 120 capsule 5   risperiDONE  (RISPERDAL ) 0.5 MG tablet Take 1 tablet (0.5 mg total) by mouth at bedtime. 30 tablet 2   risperiDONE  (RISPERDAL ) 2 MG tablet Take 1 tablet (2 mg total) by mouth at bedtime. 90 tablet 1   rosuvastatin  (CRESTOR ) 20 MG tablet Take 1 tablet (20 mg total) by mouth daily. 90 tablet 3   sertraline  (ZOLOFT ) 100 MG tablet Take 2 tablets (200 mg total) by mouth every morning. 180 tablet 3   spironolactone  (ALDACTONE ) 50 MG tablet Take 1 tablet (50 mg total) by mouth 2 (two) times daily. 180 tablet 2   SUMAtriptan  (IMITREX ) 100 MG tablet Take 1 tablet (100 mg total) by mouth every 2 (two) hours as needed for migraine. May repeat in 2 hours if headache persists or recurs. 10 tablet 11   SYNTHROID  200 MCG tablet Take 1 tablet (200 mcg total) by mouth daily before breakfast. 90 tablet 1   tacrolimus  (PROGRAF ) 1 MG capsule Open two capsules and dissolve in (0.5 liters) of water . Label well and keep in fridge. Swish and spit 10mL twice a day. 30 capsule 2   tirzepatide  (MOUNJARO ) 15 MG/0.5ML Pen Inject 15 mg into the skin once a week. 6 mL 2   traMADol  (ULTRAM ) 50 MG tablet Take 1 tablet (50 mg total) by mouth daily as needed for moderate pain (pain score 4-6). 30 tablet 0   traZODone  (DESYREL ) 100 MG tablet Take 1 tablet (100 mg total) by mouth at bedtime.     Current Facility-Administered Medications  Medication Dose Route Frequency Provider Last Rate  Last Admin   botulinum toxin Type A  (BOTOX ) injection 155 Units  155 Units Intramuscular Once Yan,  Modena, MD        Medication Side Effects: None  Allergies:  Allergies  Allergen Reactions   Bupropion Rash, Dermatitis, Hives, Itching and Swelling    bupropion   Sulfa Antibiotics Itching, Rash, Swelling and Dermatitis   Sulfamethoxazole-Trimethoprim Rash, Dermatitis and Swelling    sulfamethoxazole / trimethoprim   Sulfasalazine Dermatitis, Itching and Swelling   Walnut Itching   Food     Walnut-mouth swelling/itching   Tilactase Swelling    Walnut-mouth swelling/itching  alpha-D-galactosidase enzyme  lactase   Bupropion Hcl Itching and Rash   Codeine Itching    Past Medical History:  Diagnosis Date   Acanthosis nigricans, acquired 02/09/2015   Allergy ?   Anemia    Anxiety and depression 10/31/2014   Arthritis    back, knees, right elbow   Arthritis of left hip 06/25/2018   Bilateral swelling of feet and ankles    Bipolar disorder (HCC)    Borderline personality disorder (HCC)    Cancer (HCC)    oral lichen planus, uses mouth wash, cream   Carpal tunnel syndrome 09/06/2015   Chewing difficulty    Chronic left shoulder pain 05/17/2020   Constipation    Coronary artery disease 03/21/2021   COVID 10/08/2021   Cystic teratoma    BENIGN   Dental crowns present    Diabetes mellitus without complication (HCC)    Diabetes mellitus, type 2 (HCC) 06/10/2013   Diarrhea    Diverticulosis of colon without hemorrhage    DOE (dyspnea on exertion) 03/21/2021   Dyspepsia 12/14/2020   Essential hypertension 10/02/2007   Qualifier: Diagnosis of  By: Kassie MD, Sean A    Facial rash 06/16/2019   Female hirsutism 02/09/2015   Food allergy    Walnuts   GAD (generalized anxiety disorder)    Gastritis and gastroduodenitis    Gastroesophageal reflux disease    Grade II internal hemorrhoids    Greater trochanteric bursitis of left hip 07/24/2018   Hemorrhoids, internal  07/18/2017   History of kidney stones    History of migraine headaches    History of posttraumatic stress disorder (PTSD)    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/2015   Hyperlipidemia    Hypothyroidism    HYPOTHYROIDISM, POSTSURGICAL 10/02/2007   Qualifier: Diagnosis of  By: Kassie MD, Sean A    IBS (irritable bowel syndrome)    Infertility associated with anovulation 02/09/2015   Insulin  resistance 02/09/2015   Joint pain    Leg swelling 09/21/2020   Lichen planus    Liver cirrhosis secondary to NASH (nonalcoholic steatohepatitis) (HCC)    Lower back pain 07/18/2017   MDD (major depressive disorder) 06/06/2015   Migraine 10/02/2007   Morbid obesity with BMI of 40.0-44.9, adult (HCC) 06/23/2020   NASH (nonalcoholic steatohepatitis)    Nausea without vomiting    Nonalcoholic fatty liver disease 02/09/2015   Obstructive sleep apnea 06/27/2014   Oral mucosal lesion 06/17/2019   Osteoarthritis    Other fatigue    PCOS (polycystic ovarian syndrome)    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   Qualifier: Diagnosis of  By: Kassie MD, Sean A    Trigeminal neuralgia    Unilateral primary osteoarthritis, left hip 02/10/2019   Vitamin D  deficiency     Family History  Problem Relation Age of Onset   Heart disease Father    Depression Father    Heart attack Father     Sudden death Father    Anxiety disorder Father    Bipolar disorder Father    Obesity Father    Cancer Brother        THROAT cancer   Depression Brother    Anxiety disorder Brother    Depression Sister    Breast cancer Mother    Hypertension Mother    Cancer Mother    Depression Mother    Sleep apnea Mother    Obesity Mother    Varicose Veins Mother    Depression Maternal Grandmother    Stroke Maternal Grandfather    Colon cancer Brother    Stroke Paternal Grandmother    Anxiety disorder Sister    Depression Sister    Hearing loss Sister    Diabetes Brother    Hearing loss Brother    Obesity Brother    Pancreatic cancer Neg Hx    Stomach cancer Neg Hx    Liver disease Neg Hx     Social History   Socioeconomic History   Marital status: Divorced    Spouse name: Not on file   Number of children: 0   Years of education: 16   Highest education level: Bachelor's degree (e.g., BA, AB, BS)  Occupational History   Occupation: RN-Telephone Triage Nurse    Employer: [employer]    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   Drug use: No   Sexual activity: Not Currently    Partners: Male    Birth control/protection: Post-menopausal    Comment: Divorced  Other Topics Concern   Not on file  Social History Narrative   RN - [employer]   Regular exercise: no   Caffeine  use: 2 x daily   Right-handed.   Lives alone.   Social Drivers of Corporate Investment Banker Strain: Low Risk  (03/01/2024)   Overall Financial Resource Strain (CARDIA)     Difficulty of Paying Living Expenses: Not hard at all  Food Insecurity: No Food Insecurity (03/01/2024)   Hunger Vital Sign    Worried About Running Out of Food in the Last Year: Never true    Ran Out of Food in the Last Year: Never true  Transportation Needs: No Transportation Needs (03/01/2024)   PRAPARE - Administrator, Civil Service (Medical): No    Lack of Transportation (  Non-Medical): No  Physical Activity: Insufficiently Active (03/01/2024)   Exercise Vital Sign    Days of Exercise per Week: 1 day    Minutes of Exercise per Session: 10 min  Stress: Stress Concern Present (03/01/2024)   [employer] of Occupational Health - Occupational Stress Questionnaire    Feeling of Stress: Rather much  Social Connections: Moderately Isolated (03/01/2024)   Social Connection and Isolation Panel    Frequency of Communication with Friends and Family: More than three times a week    Frequency of Social Gatherings with Friends and Family: Once a week    Attends Religious Services: More than 4 times per year    Active Member of [employer] or Organizations: No    Attends Engineer, Structural: Not on file    Marital Status: Divorced  Intimate Partner Violence: Unknown (11/09/2021)   Received from [facility]   HITS    Physically Hurt: Not on file    Insult or Talk Down To: Not on file    Threaten Physical Harm: Not on file    Scream or Curse: Not on file    Past Medical History, Surgical history, Social history, and Family history were reviewed and updated as appropriate.   Please see review of systems for further details on the patient's review from today.   Objective:   Physical Exam:  LMP 08/24/2020   Physical Exam Constitutional:      General: She is not in acute distress. Musculoskeletal:        General: No deformity.  Neurological:     Mental Status: She is alert and oriented to person, place, and time.     Coordination: Coordination normal.   Psychiatric:        Attention and Perception: Attention and perception normal. She does not perceive auditory or visual hallucinations.        Mood and Affect: Mood normal. Mood is not anxious or depressed. Affect is not labile, blunt, angry or inappropriate.        Speech: Speech normal.        Behavior: Behavior normal.        Thought Content: Thought content normal. Thought content is not paranoid or delusional. Thought content does not include homicidal or suicidal ideation. Thought content does not include homicidal or suicidal plan.        Cognition and Memory: Cognition and memory normal.        Judgment: Judgment normal.     Comments: Insight intact     Lab Review:     Component Value Date/Time   NA 136 03/03/2024 1559   K 4.2 03/03/2024 1559   CL 98 03/03/2024 1559   CO2 20 03/03/2024 1559   GLUCOSE 92 03/03/2024 1559   GLUCOSE 91 08/15/2023 1004   BUN 13 03/03/2024 1559   CREATININE 1.07 (H) 03/03/2024 1559   CREATININE 0.95 11/28/2022 1509   CALCIUM  9.8 03/03/2024 1559   PROT 7.2 03/03/2024 1559   ALBUMIN 4.4 03/03/2024 1559   AST 18 03/03/2024 1559   ALT 18 03/03/2024 1559   ALKPHOS 147 (H) 03/03/2024 1559   BILITOT 0.4 03/03/2024 1559   GFRNONAA >60 08/15/2023 1004   GFRNONAA 99 12/26/2020 0000   GFRAA 115 12/26/2020 0000       Component Value Date/Time   WBC 4.1 05/17/2024 1030   WBC 4.9 08/15/2023 1004   RBC 4.26 05/17/2024 1030   RBC 4.49 08/15/2023 1004   HGB 12.7 05/17/2024 1030   HCT  39.0 05/17/2024 1030   PLT 169 05/17/2024 1030   MCV 92 05/17/2024 1030   MCH 29.8 05/17/2024 1030   MCH 30.7 08/15/2023 1004   MCHC 32.6 05/17/2024 1030   MCHC 33.9 08/15/2023 1004   RDW 14.8 05/17/2024 1030   LYMPHSABS 1.5 05/17/2024 1030   MONOABS 0.3 08/15/2023 1004   EOSABS 0.1 05/17/2024 1030   BASOSABS 0.0 05/17/2024 1030    No results found for: POCLITH, LITHIUM   No results found for: PHENYTOIN, PHENOBARB, VALPROATE, CBMZ    .res Assessment: Plan:    Plan:  PDMP reviewed  Lamictal  - 2 - 200mg  daily Zoloft  100mg  - 2 daily Nuvigil  200mg  daily - takes every day Clonazepam  1mg  TID as needed for anxiety Hydroxyzine  25mg  TID - using 1 tablet at bedtime.  Trazadone 100mg  - take one tablet at bedtime   Risperdal  2mg  at hs   Increase Risperdal  0.5mg  to 1mg  in the morning for depressive symptoms - helps with morning anxiety.  Incre[facility] visits with [facility]   Reached out to IOP program at [facility] - could not afford 609$ daily.  Will take patient out of work 05/12/2024 through 08/12/2024.   RTC 4 weeks  25 minutes spent dedicated to the care of this patient on the date of this encounter to include pre-visit review of records, ordering of medication, post visit documentation, and face-to-face time with the patient discussing MDD, GAD, PTSD, and panic attacks. Discussed increasing Trazadone to help with sleep.   Patient advised to contact office with any questions, adverse effects, or acute worsening in signs and symptoms.  Counseled patient regarding potential benefits, risks, and side effects of Lamictal  to include potential risk of Stevens-Johnson syndrome. Advised patient to stop taking Lamictal  and contact office immediately if rash develops and to seek urgent medical attention if rash is severe and/or spreading quickly.   Discussed potential benefits, risk, and side effects of benzodiazepines to include potential risk of tolerance and dependence, as well as possible drowsiness. Advised patient not to drive if experiencing drowsiness and to take lowest possible effective dose to minimize risk of dependence and tolerance.  Discussed potential metabolic side effects associated with atypical antipsychotics, as well as potential risk for movement side effects. Advised pt to contact office if movement side effects occur.     There are no diagnoses linked to this encounter.   Please see After  Visit Summary for patient specific instructions.  Future Appointments  Date Time Provider Department Center  05/31/2024  4:30 PM Ceola Para, Tracy Mattocks, NP [facility] None  06/25/2024  8:30 AM Ziyon Soltau Nattalie, NP [facility] None  07/05/2024  3:00 PM Onita Duos, MD [facility] None  07/13/2024  1:40 PM Ivin Snuffer, MD [facility]    No orders of the defined types were placed in this encounter.     -------------------------------

## 2024-06-01 ENCOUNTER — Other Ambulatory Visit (HOSPITAL_COMMUNITY)

## 2024-06-02 ENCOUNTER — Other Ambulatory Visit (HOSPITAL_COMMUNITY)

## 2024-06-03 ENCOUNTER — Other Ambulatory Visit (HOSPITAL_COMMUNITY)

## 2024-06-04 ENCOUNTER — Other Ambulatory Visit (HOSPITAL_COMMUNITY)

## 2024-06-07 ENCOUNTER — Other Ambulatory Visit (HOSPITAL_COMMUNITY)

## 2024-06-07 DIAGNOSIS — M5416 Radiculopathy, lumbar region: Secondary | ICD-10-CM | POA: Diagnosis not present

## 2024-06-07 DIAGNOSIS — M47816 Spondylosis without myelopathy or radiculopathy, lumbar region: Secondary | ICD-10-CM | POA: Diagnosis not present

## 2024-06-08 ENCOUNTER — Other Ambulatory Visit: Payer: Self-pay

## 2024-06-08 ENCOUNTER — Other Ambulatory Visit (HOSPITAL_COMMUNITY)

## 2024-06-08 ENCOUNTER — Other Ambulatory Visit (HOSPITAL_COMMUNITY): Payer: Self-pay

## 2024-06-09 ENCOUNTER — Other Ambulatory Visit (HOSPITAL_COMMUNITY)

## 2024-06-09 DIAGNOSIS — F3181 Bipolar II disorder: Secondary | ICD-10-CM | POA: Diagnosis not present

## 2024-06-10 ENCOUNTER — Other Ambulatory Visit (HOSPITAL_BASED_OUTPATIENT_CLINIC_OR_DEPARTMENT_OTHER): Payer: Self-pay

## 2024-06-10 ENCOUNTER — Other Ambulatory Visit (HOSPITAL_COMMUNITY)

## 2024-06-10 ENCOUNTER — Ambulatory Visit

## 2024-06-10 VITALS — BP 110/80 | HR 72 | Temp 97.9°F | Resp 16 | Ht 71.0 in | Wt 272.0 lb

## 2024-06-10 DIAGNOSIS — T2125XS Burn of second degree of buttock, sequela: Secondary | ICD-10-CM | POA: Diagnosis not present

## 2024-06-10 DIAGNOSIS — M5416 Radiculopathy, lumbar region: Secondary | ICD-10-CM

## 2024-06-10 DIAGNOSIS — E785 Hyperlipidemia, unspecified: Secondary | ICD-10-CM | POA: Diagnosis not present

## 2024-06-10 MED ORDER — COLLAGENASE 250 UNIT/GM EX OINT
1.0000 | TOPICAL_OINTMENT | Freq: Every day | CUTANEOUS | 0 refills | Status: AC
  Filled 2024-06-10: qty 30, 30d supply, fill #0
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
## 2024-06-10 NOTE — Assessment & Plan Note (Signed)
 Tracy [name]

## 2024-06-11 ENCOUNTER — Other Ambulatory Visit (HOSPITAL_BASED_OUTPATIENT_CLINIC_OR_DEPARTMENT_OTHER): Payer: Self-pay

## 2024-06-11 ENCOUNTER — Other Ambulatory Visit (HOSPITAL_COMMUNITY)

## 2024-06-14 ENCOUNTER — Other Ambulatory Visit (HOSPITAL_COMMUNITY)

## 2024-06-15 ENCOUNTER — Other Ambulatory Visit (HOSPITAL_COMMUNITY)

## 2024-06-16 ENCOUNTER — Other Ambulatory Visit (HOSPITAL_COMMUNITY)

## 2024-06-16 DIAGNOSIS — M5416 Radiculopathy, lumbar region: Secondary | ICD-10-CM | POA: Diagnosis not present

## 2024-06-17 ENCOUNTER — Other Ambulatory Visit (HOSPITAL_COMMUNITY)

## 2024-06-17 DIAGNOSIS — F3181 Bipolar II disorder: Secondary | ICD-10-CM | POA: Diagnosis not present

## 2024-06-18 ENCOUNTER — Other Ambulatory Visit (HOSPITAL_COMMUNITY)

## 2024-06-21 ENCOUNTER — Other Ambulatory Visit: Payer: Self-pay | Admitting: Neurology

## 2024-06-21 ENCOUNTER — Other Ambulatory Visit (HOSPITAL_COMMUNITY)

## 2024-06-21 ENCOUNTER — Other Ambulatory Visit: Payer: Self-pay | Admitting: Adult Health

## 2024-06-21 ENCOUNTER — Other Ambulatory Visit (HOSPITAL_COMMUNITY): Payer: Self-pay

## 2024-06-21 ENCOUNTER — Other Ambulatory Visit: Payer: Self-pay

## 2024-06-21 DIAGNOSIS — F332 Major depressive disorder, recurrent severe without psychotic features: Secondary | ICD-10-CM

## 2024-06-22 ENCOUNTER — Other Ambulatory Visit (HOSPITAL_COMMUNITY)

## 2024-06-22 DIAGNOSIS — F3181 Bipolar II disorder: Secondary | ICD-10-CM | POA: Diagnosis not present

## 2024-06-23 ENCOUNTER — Other Ambulatory Visit (HOSPITAL_COMMUNITY)

## 2024-06-23 ENCOUNTER — Ambulatory Visit: Payer: Self-pay

## 2024-06-23 NOTE — Telephone Encounter (Signed)
 FYI Only or Action Required?: FYI only for provider: appointment scheduled on 06/24/24.  Patient was last seen in primary care on 06/10/2024 by Sirivol, Mamatha, MD.  Called Nurse Triage reporting Back Pain and Leg Pain.  Symptoms began about a month ago.  Interventions attempted: OTC medications: Tylenol , ibuprofen , Prescription medications: Lyrica , and Other: Epidural injection.  Symptoms are: gradually worsening.  Triage Disposition: See PCP Within 2 Weeks  Patient/caregiver understands and will follow disposition?: Yes  Copied from CRM [id number]. Topic: Clinical - Red Word Triage >> Jun 23, 2024  4:56 PM Dedra B wrote: Red Word that prompted transfer to Nurse Triage: Pt having worsening back and thigh pain. Warm transfer to NT. Reason for Disposition  Back pain present > 2 weeks  Answer Assessment - Initial Assessment Questions Pt reports onset of severe 10/10 low back pain that radiates down into right hip, groin and thigh 1 month ago. Has been seeing her orthopedic provider for this, had an MRI on 10/23, dx with L3/L4 impingement. Had an epidural injection on 11/12 without any relief. Reports ongoing difficulty walking, denies weakness, numbness or changes to bowel and bladder control. Requesting to schedule appt with her PCP. Scheduled appt with PCP tomorrow morning. Advised UC or ED for worsening symptoms.   1. ONSET: When did the pain begin? (e.g., minutes, hours, days)     1 month ago  2. LOCATION: Where does it hurt? (upper, mid or lower back)     Low back   3. SEVERITY: How bad is the pain?  (e.g., Scale 1-10; mild, moderate, or severe)     10/10 aching pain, sharp shooting pain when walking   4. PATTERN: Is the pain constant? (e.g., yes, no; constant, intermittent)      Constant  5. RADIATION: Does the pain shoot into your legs or somewhere else?     Down right hip and thigh and groin  6. CAUSE:  What do you think is causing the back pain?       Unsure  7. BACK OVERUSE:  Any recent lifting of heavy objects, strenuous work or exercise?     Denies  8. MEDICINES: What have you taken so far for the pain? (e.g., nothing, acetaminophen , NSAIDS)     Tylenol  and ibuprofen . Lyrica . Had epidural injection on 11/12 without relief.  9. NEUROLOGIC SYMPTOMS: Do you have any weakness, numbness, or problems with bowel/bladder control?     Denies  10. OTHER SYMPTOMS: Do you have any other symptoms? (e.g., fever, abdomen pain, burning with urination, blood in urine)       Denies  Protocols used: Back Pain-A-AH

## 2024-06-24 ENCOUNTER — Other Ambulatory Visit (HOSPITAL_COMMUNITY)

## 2024-06-24 ENCOUNTER — Ambulatory Visit

## 2024-06-25 ENCOUNTER — Other Ambulatory Visit (HOSPITAL_BASED_OUTPATIENT_CLINIC_OR_DEPARTMENT_OTHER): Payer: Self-pay

## 2024-06-25 ENCOUNTER — Ambulatory Visit (INDEPENDENT_AMBULATORY_CARE_PROVIDER_SITE_OTHER): Payer: Self-pay | Admitting: Adult Health

## 2024-06-25 ENCOUNTER — Other Ambulatory Visit (HOSPITAL_COMMUNITY)

## 2024-06-25 DIAGNOSIS — Z0389 Encounter for observation for other suspected diseases and conditions ruled out: Secondary | ICD-10-CM

## 2024-06-25 MED ORDER — TRAMADOL HCL 50 MG PO TABS
50.0000 mg | ORAL_TABLET | Freq: Four times a day (QID) | ORAL | 0 refills | Status: DC | PRN
  Filled 2024-06-25: qty 28, 7d supply, fill #0

## 2024-06-25 NOTE — Progress Notes (Signed)
 Patient no show appointment. ? ?

## 2024-06-25 NOTE — Telephone Encounter (Signed)
 LVM per DPR to let me know what dose Risperdal  she should be on. Recent notes have different doses.

## 2024-06-28 ENCOUNTER — Other Ambulatory Visit (HOSPITAL_BASED_OUTPATIENT_CLINIC_OR_DEPARTMENT_OTHER): Payer: Self-pay

## 2024-06-28 ENCOUNTER — Other Ambulatory Visit: Payer: Self-pay

## 2024-06-28 ENCOUNTER — Other Ambulatory Visit (HOSPITAL_COMMUNITY): Payer: Self-pay

## 2024-06-28 MED ORDER — FLUCONAZOLE 150 MG PO TABS
ORAL_TABLET | ORAL | 0 refills | Status: AC
  Filled 2024-06-28: qty 5, 4d supply, fill #0

## 2024-06-29 ENCOUNTER — Other Ambulatory Visit: Payer: Self-pay

## 2024-06-29 NOTE — Telephone Encounter (Signed)
 Left second VM to [facility], also sent MyChart message. Need to verify Risperdal  dose, but she was a no show 11/21, and she needs to reschedule FU.

## 2024-06-30 ENCOUNTER — Other Ambulatory Visit (HOSPITAL_COMMUNITY): Payer: Self-pay

## 2024-06-30 ENCOUNTER — Other Ambulatory Visit: Payer: Self-pay

## 2024-06-30 DIAGNOSIS — M545 Low back pain, unspecified: Secondary | ICD-10-CM | POA: Diagnosis not present

## 2024-06-30 MED ORDER — RISPERIDONE 1 MG PO TABS
1.0000 mg | ORAL_TABLET | Freq: Every day | ORAL | 0 refills | Status: DC
  Filled 2024-06-30: qty 30, 30d supply, fill #0

## 2024-07-01 ENCOUNTER — Other Ambulatory Visit: Payer: Self-pay | Admitting: Adult Health

## 2024-07-01 DIAGNOSIS — F332 Major depressive disorder, recurrent severe without psychotic features: Secondary | ICD-10-CM

## 2024-07-02 ENCOUNTER — Other Ambulatory Visit (HOSPITAL_COMMUNITY): Payer: Self-pay

## 2024-07-02 ENCOUNTER — Other Ambulatory Visit: Payer: Self-pay

## 2024-07-05 ENCOUNTER — Ambulatory Visit: Admitting: Neurology

## 2024-07-05 ENCOUNTER — Encounter: Payer: Self-pay | Admitting: Neurology

## 2024-07-05 ENCOUNTER — Other Ambulatory Visit: Payer: Self-pay

## 2024-07-05 ENCOUNTER — Other Ambulatory Visit (HOSPITAL_COMMUNITY): Payer: Self-pay

## 2024-07-05 VITALS — BP 104/70 | Ht 69.0 in | Wt 265.0 lb

## 2024-07-05 DIAGNOSIS — G43709 Chronic migraine without aura, not intractable, without status migrainosus: Secondary | ICD-10-CM

## 2024-07-05 DIAGNOSIS — R269 Unspecified abnormalities of gait and mobility: Secondary | ICD-10-CM | POA: Diagnosis not present

## 2024-07-05 MED ORDER — SUMATRIPTAN SUCCINATE 100 MG PO TABS
100.0000 mg | ORAL_TABLET | ORAL | 11 refills | Status: AC | PRN
  Filled 2024-07-05: qty 9, 30d supply, fill #0

## 2024-07-05 MED ORDER — QULIPTA 60 MG PO TABS
60.0000 mg | ORAL_TABLET | Freq: Every day | ORAL | 3 refills | Status: AC
  Filled 2024-07-05 – 2024-08-18 (×3): qty 90, 90d supply, fill #0

## 2024-07-05 NOTE — Progress Notes (Signed)
 Patient was not compliant with injections. Is using oral therapy. Disenrolling.

## 2024-07-05 NOTE — Progress Notes (Signed)
 Chief Complaint  Patient presents with   RM15/GAIT/MIGRAINES    Pt is here Alone. Pt states she has been doing good since last appointment.       ASSESSMENT AND PLAN  Tracy [name] is a 58 y.o. female     Chronic migraine  Doing better with current dose of Qulipta  60 mg daily  Imitrex  as needed  Return in one year  DIAGNOSTIC DATA (LABS, IMAGING, TESTING) - I reviewed patient records, labs, notes, testing and imaging myself where available.   MEDICAL HISTORY:  Tracy [name], 58 year old female seen in request by her primary care physician Dr. Alvia, Velma, for evaluation of migraine  I reviewed and summarized the referring note. PMHX. Diabetes  Hypothyroidism, Type II bipolar disorder, GERD Obstructive sleep apnea. Lumbar and cervical decompression surgery Left hip replacement Bilateral Achilles reconstruction. Right rotator cuff repair  She had long history of migraine, often came on suddenly, lateralized severe pounding headache with light, noise sensitivity, worsening by movement, blurry vision, since January 2024, she reported increased frequency of headache, up to 4 times a week, tried Maxalt  , Nurtec, over-the-counter medication, Fioricet  did not help, ice pack, sleep is helpful, headache can last for hours to days,  For preventive medication, she has tried Topamax  in the past with limited help, now on polypharmacy including Lyrica , Zoloft , Risperdal , lamotrigine , clonazepam ,  She denies visual change, recent eye examination showed no significant abnormality CT head was normal in Feb 2021  UPDATE Dec 25 2023: Her migraine overall is under better control taking Qulipta  60 mg daily, Imitrex  as needed, uses about 10 tablets each month,  Today her main concern is unsteady gait, frequent falling  She lives alone, complains a lot of stress since divorce in 2022, on polypharmacy for her mood disorder including Risperdal , not sleeping well, feeling tired all the  time, 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, spend the majority of her time sitting down, also complains of moderate left knee pain  Recent few weeks complains of difficulty eating, 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  PHYSICAL EXAMNIATION:  Gen: NAD, conversant, well nourised, well groomed                     Cardiovascular: Regular rate rhythm, no peripheral edema, warm, nontender. Eyes: Conjunctivae clear without exudates or hemorrhage Neck: Supple, no carotid bruits. Pulmonary: Clear to auscultation bilaterally   NEUROLOGICAL EXAM:  MENTAL STATUS: Speech/cognition: tired looking middle-age female, obese, awake, alert, oriented to history taking and casual conversation CRANIAL NERVES: CN II: Visual fields are full to confrontation. Pupils are round equal and briskly reactive to light.  Funduscopy examination showed sharp disc bilaterally CN III, IV, VI: extraocular movement are normal. No ptosis. CN V: Facial sensation is intact to light touch CN VII: Face is symmetric with normal eye closure  CN VIII: Hearing  is normal to causal conversation. CN IX, X: Phonation is normal. CN XI: Head turning and shoulder shrug are intact  MOTOR: There is no pronator drift of out-stretched arms. Muscle bulk and tone are normal. Muscle strength is normal.  REFLEXES: Reflexes are 1 and symmetric at the biceps, triceps, knees, and trace at  ankles. Plantar responses are flexor.  SENSORY: Intact to light touch, pinprick and vibratory sensation are intact in fingers and toes.  COORDINATION: There is no trunk or limb dysmetria noted.  GAIT/STANCE: Push-up to get up from seated position, cautious  REVIEW OF SYSTEMS:  Full 14 system review of systems performed and notable only for as above All other review of systems were negative.   ALLERGIES: Allergies  Allergen Reactions   Bupropion Rash, Dermatitis, Hives, Itching and Swelling    bupropion   Sulfa Antibiotics Itching, Rash, Swelling and Dermatitis   Sulfamethoxazole-Trimethoprim Rash, Dermatitis and Swelling    sulfamethoxazole / trimethoprim   Sulfasalazine Dermatitis, Itching and Swelling   Walnut Itching   Food     Walnut-mouth swelling/itching   Tilactase Swelling    Walnut-mouth swelling/itching  alpha-D-galactosidase enzyme  lactase   Bupropion Hcl Itching and Rash   Codeine Itching    HOME MEDICATIONS: Current Outpatient Medications  Medication Sig Dispense Refill   albuterol  (VENTOLIN  HFA) 108 (90 Base) MCG/ACT inhaler Inhale 2 puffs into the lungs every 6 (six) hours as needed for wheezing or shortness of breath. 8 g 2   Armodafinil  200 MG TABS Take 1 tablet (200 mg total) by mouth daily as needed (sleepiness). 30 tablet 2   Atogepant  (QULIPTA ) 60 MG TABS Take 1 tablet (60 mg total) by mouth daily. 30 tablet 11   botulinum toxin Type A  (BOTOX ) 200 units injection Inject 200 Units into the muscle every 3 (three) months. 1 each 2   clobetasol  ointment (TEMOVATE ) 0.05 % Apply topically 2 (two) times daily. 60 g 6   clonazePAM  (KLONOPIN ) 1 MG tablet Take 1 tablet (1 mg total) by mouth 3 (three) times daily. 90 tablet 2   Continuous Glucose Sensor (DEXCOM G7 SENSOR) MISC Use to check glucose throughout the day.  Change every 10 days 9 each 1   cyclobenzaprine  (FLEXERIL ) 10 MG tablet Take 1 tablet (10 mg total) by mouth 3 (three) times daily as needed  for muscle spasms. 30 tablet 1   furosemide  (LASIX ) 40 MG tablet Take 1 tablet (40 mg total) by mouth daily. 90 tablet 0   glucose blood (FREESTYLE LITE) test strip Check fasting blood sugar every morning and before meals up to 4 times a day as directed. 100 strip 12   hydrOXYzine  (ATARAX ) 25 MG tablet Take 1 tablet (25 mg total) by mouth 3 (three) times daily as needed for anxiety. 270 tablet 1   lamoTRIgine  (LAMICTAL ) 200 MG tablet Take 2 tablets (400 mg total) by mouth at bedtime.     lisinopril  (ZESTRIL ) 5 MG tablet Take 1 tablet (5 mg total) by mouth daily. 90 tablet 2   mycophenolate  (CELLCEPT ) 500 MG tablet Take 2 tablets (1,000mg ) twice per day; if you are tolerating it well after 2 weeks and labs look good increase to 3 tablets (1,500mg ) twice per day 120 tablet 2   mycophenolate  (CELLCEPT ) 500 MG tablet Take 3 tablets (1,500 mg total) by mouth every 12 (twelve) hours. 540 tablet 0   nystatin  ointment (MYCOSTATIN ) Apply to the skin 2 times daily to corners of mouth 15 g 2   ondansetron  (ZOFRAN -ODT)  4 MG disintegrating tablet Dissolve 1 tablet (4 mg total) by mouth 2 (two) times daily as needed for nausea or vomiting. 20 tablet 1   pantoprazole  (PROTONIX ) 40 MG tablet Take 2 tablets (80 mg total) by mouth 2 (two) times daily. 60 tablet 3   pimecrolimus  (ELIDEL ) 1 % cream Apply a small amount to affected area 2 x daily. Gently pat area dry w/cotton gauze. No eating, drinking or speaking for 20 - 30 minutes to allow medicine to be absorbed. Can apply to 2x2 gauze & placed against the affected area. 60 g 3   potassium chloride  (MICRO-K ) 10 MEQ CR capsule Take 2 capsules (20 mEq total) by mouth daily for 5 days. 10 capsule 0   predniSONE  (STERAPRED UNI-PAK 21 TAB) 5 MG (21) TBPK tablet Take 1 tablet (5 mg total) by mouth as directed for 12 days 48 tablet 1   pregabalin  (LYRICA ) 75 MG capsule Take 1 capsule (75 mg total) by mouth in the morning AND 1 capsule (75 mg total) daily at 12 noon AND 2  capsules (150 mg total) at bedtime. 120 capsule 5   risperiDONE  (RISPERDAL ) 1 MG tablet Take 1 tablet (1 mg total) by mouth at bedtime. 30 tablet 0   risperiDONE  (RISPERDAL ) 2 MG tablet Take 1 tablet (2 mg total) by mouth at bedtime. 90 tablet 1   rosuvastatin  (CRESTOR ) 20 MG tablet Take 1 tablet (20 mg total) by mouth daily. 90 tablet 3   sertraline  (ZOLOFT ) 100 MG tablet Take 2 tablets (200 mg total) by mouth every morning. 180 tablet 3   spironolactone  (ALDACTONE ) 50 MG tablet Take 1 tablet (50 mg total) by mouth 2 (two) times daily. 180 tablet 2   SUMAtriptan  (IMITREX ) 100 MG tablet Take 1 tablet (100 mg total) by mouth every 2 (two) hours as needed for migraine. May repeat in 2 hours if headache persists or recurs. 10 tablet 11   SYNTHROID  200 MCG tablet Take 1 tablet (200 mcg total) by mouth daily before breakfast. 90 tablet 1   tacrolimus  (PROGRAF ) 1 MG capsule Open two capsules and dissolve in (0.5 liters) of water . Label well and keep in fridge. Swish and spit 10mL twice a day. 30 capsule 2   tirzepatide  (MOUNJARO ) 15 MG/0.5ML Pen Inject 15 mg into the skin once a week. 6 mL 2   traMADol  (ULTRAM ) 50 MG tablet Take 1 tablet (50 mg total) by mouth daily as needed for moderate pain (pain score 4-6). 30 tablet 0   traMADol  (ULTRAM ) 50 MG tablet Take 1 tablet every 6-8 hours by oral route as needed for 7 days, for pain. 28 tablet 0   traZODone  (DESYREL ) 100 MG tablet Take 1 tablet (100 mg total) by mouth at bedtime.     AMBULATORY NON FORMULARY MEDICATION Rollator for use daily. Elevated toilet seat with handlebars (Patient not taking: Reported on 07/05/2024) 1 each 0   collagenase  (SANTYL ) 250 UNIT/GM ointment Apply 1 Application topically daily. (Patient not taking: Reported on 07/05/2024) 30 g 0   diazepam  (VALIUM ) 5 MG tablet Take 1 tablet (5 mg total) by mouth 30 (thirty) minutes before MRI and repeat if necessary. (Patient not taking: Reported on 07/05/2024) 2 tablet 0   Insulin  Pen  Needle (UNIFINE PENTIPS) 31G X 5 MM MISC Use as directed to inject insulin  (Patient not taking: Reported on 07/05/2024) 100 each 3   Lancets 30G MISC Check fasting blood sugar every morning and before meals up to 4 times daily. (Patient  not taking: Reported on 07/05/2024) 100 each 12   lidocaine  (LIDODERM ) 5 % Place 1 patch onto the skin every 12 (twelve) hours. Remove & Discard patch within 12 hours or as directed by MD (Patient not taking: Reported on 07/05/2024) 30 patch 0   lidocaine  (XYLOCAINE ) 5 % ointment Apply topically to affected areas 3 times daily as needed. (Patient not taking: Reported on 07/05/2024) 35.44 g 0   meloxicam  (MOBIC ) 15 MG tablet Take 1 tablet by mouth once every morning with a meal for 2 weeks, then daily as needed for pain. (Patient not taking: Reported on 07/05/2024) 30 tablet 0   Menthol , Topical Analgesic, (BENGAY EX) Apply 1 Application topically daily as needed (pain). (Patient not taking: Reported on 07/05/2024)     methylPREDNISolone  (MEDROL  DOSEPAK) 4 MG TBPK tablet Take by mouth 6 tabs on day 1, 5 tabs on day 2, 4 tabs on day 3, 3 tabs on day 4, 2 tabs on day 5,  1 tab on day 6. (Patient not taking: Reported on 07/05/2024) 21 tablet 1   neomycin-bacitracin -polymyxin [phone] OINT Apply topically 2 (two) times daily for 10 days To corners of mouth (Patient not taking: Reported on 07/05/2024) 14 g 2   nystatin  (NYAMYC ) powder Apply 3 times daily as directed. (Patient not taking: Reported on 07/05/2024) 60 g 3   Current Facility-Administered Medications  Medication Dose Route Frequency Provider Last Rate Last Admin   botulinum toxin Type A  (BOTOX ) injection 155 Units  155 Units Intramuscular Once Onita Duos, MD        PAST MEDICAL HISTORY: Past Medical History:  Diagnosis Date   Acanthosis nigricans, acquired 02/09/2015   Allergy ?   Anemia    Anxiety and depression 10/31/2014   Arthritis    back, knees, right elbow   Arthritis of left hip 06/25/2018   Bilateral  swelling of feet and ankles    Bipolar disorder (HCC)    Borderline personality disorder (HCC)    Cancer (HCC)    oral lichen planus, uses mouth wash, cream   Carpal tunnel syndrome 09/06/2015   Chewing difficulty    Chronic left shoulder pain 05/17/2020   Constipation    Coronary artery disease 03/21/2021   COVID 10/08/2021   Cystic teratoma    BENIGN   Dental crowns present    Diabetes mellitus without complication (HCC)    Diabetes mellitus, type 2 (HCC) 06/10/2013   Diarrhea    Diverticulosis of colon without hemorrhage    DOE (dyspnea on exertion) 03/21/2021   Dyspepsia 12/14/2020   Essential hypertension 10/02/2007   Qualifier: Diagnosis of  By: Kassie MD, Alyce A    Facial rash 06/16/2019   Female hirsutism 02/09/2015   Food allergy    Walnuts   GAD (generalized anxiety disorder)    Gastritis and gastroduodenitis    Gastroesophageal reflux disease    Grade II internal hemorrhoids    Greater trochanteric bursitis of left hip 07/24/2018   Hemorrhoids, internal 07/18/2017   History of kidney stones    History of migraine headaches    History of posttraumatic stress disorder (PTSD)    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/2015   Hyperlipidemia    Hypothyroidism    HYPOTHYROIDISM, POSTSURGICAL 10/02/2007   Qualifier: Diagnosis of  By: Kassie MD, Sean A    IBS (irritable bowel syndrome)    Infertility associated with anovulation 02/09/2015   Insulin  resistance 02/09/2015   Joint pain    Leg swelling 09/21/2020   Lichen planus  Liver cirrhosis secondary to NASH (nonalcoholic steatohepatitis) (HCC)    Lower back pain 07/18/2017   MDD (major depressive disorder) 06/06/2015   Migraine 10/02/2007   Morbid obesity with BMI of 40.0-44.9, adult (HCC) 06/23/2020   NASH (nonalcoholic steatohepatitis)    Nausea without vomiting    Nonalcoholic fatty liver disease 02/09/2015   Obstructive sleep apnea 06/27/2014   Oral mucosal lesion 06/17/2019   Osteoarthritis    Other fatigue     PCOS (polycystic ovarian syndrome)    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   Qualifier: Diagnosis of  By: Kassie MD, Sean A    Trigeminal neuralgia    Unilateral primary osteoarthritis, left hip 02/10/2019   Vitamin D  deficiency     PAST SURGICAL HISTORY: Past Surgical History:  Procedure Laterality Date   ACHILLES TENDON SURGERY Left 2007   APPENDECTOMY     CARPAL TUNNEL RELEASE Right 01/12/2016   Procedure: RIGHT CARPAL TUNNEL RELEASE;  Surgeon: Elsie Mussel, MD;  Location: [facility];  Service: Orthopedics;  Laterality: Right;   COLONOSCOPY WITH PROPOFOL  N/A 04/20/2014   Procedure: COLONOSCOPY WITH PROPOFOL ;  Surgeon: Elsie Cree, MD;  Location: [facility];  Service: Endoscopy;  Laterality: N/A;   COLONOSCOPY WITH PROPOFOL  N/A 01/10/2021   Procedure: COLONOSCOPY WITH PROPOFOL ;  Surgeon: San Sandor GAILS, DO;  Location: [facility];  Service: Gastroenterology;  Laterality: N/A;   ESOPHAGOGASTRODUODENOSCOPY (EGD) WITH PROPOFOL  N/A 04/20/2014   Procedure: ESOPHAGOGASTRODUODENOSCOPY (EGD) WITH PROPOFOL ;  Surgeon: Elsie Cree, MD;  Location: [facility];  Service: Endoscopy;  Laterality: N/A;   ESOPHAGOGASTRODUODENOSCOPY (EGD) WITH PROPOFOL  N/A 01/10/2021   Procedure: ESOPHAGOGASTRODUODENOSCOPY (EGD) WITH PROPOFOL ;  Surgeon:  San Sandor GAILS, DO;  Location: [facility];  Service: Gastroenterology;  Laterality: N/A;   EXCISION HAGLUND'S DEFORMITY WITH ACHILLES TENDON REPAIR Right 02/25/2013   Procedure: RIGHT ACHILLES DEBRIDEMENT AND RECONSTRUCTION;  HAGLUND'S EXCISION; GASTROC RECESSION AND FLEXOR HALLUCIS LONGUS TRANSFER;  Surgeon: Norleen Armor, MD;  Location: [facility];  Service: Orthopedics;  Laterality: Right;   IR ANGIOGRAM SELECTIVE EACH ADDITIONAL VESSEL  05/15/2023   IR ANGIOGRAM SELECTIVE EACH ADDITIONAL VESSEL  05/15/2023   IR ANGIOGRAM SELECTIVE EACH ADDITIONAL VESSEL  05/15/2023   IR ANGIOGRAM SELECTIVE EACH ADDITIONAL VESSEL  05/15/2023   IR EMBO ARTERIAL NOT HEMORR HEMANG INC GUIDE ROADMAPPING  05/15/2023   IR RADIOLOGIST EVAL & MGMT  04/16/2023   IR US  GUIDE VASC ACCESS LEFT  05/15/2023   JOINT REPLACEMENT     hip   LUMBAR LAMINECTOMY     X 3   neck fusion     OVARIAN CYST SURGERY Left 1998   PILONIDAL CYST EXCISION  1994   RIGHT OOPHORECTOMY     SHOULDER ARTHROSCOPY WITH ROTATOR CUFF REPAIR Right 10/17/2022   Procedure: Right shoulder arthroscopy,  debridement, subacromial decompression, distal clavicle resection, rotator cuff repair;  Surgeon: Melita Drivers, MD;  Location: [facility];  Service: Orthopedics;  Laterality: Right;    SPINE SURGERY     STERIOD INJECTION Left 01/12/2016   Procedure: STEROID INJECTION LEFT WRIST;  Surgeon: Elsie Mussel, MD;  Location: [facility];  Service: Orthopedics;  Laterality: Left;   TOTAL HIP ARTHROPLASTY Left 03/19/2019   Procedure: LEFT TOTAL HIP ARTHROPLASTY ANTERIOR APPROACH;  Surgeon: Vernetta Lonni GRADE, MD;  Location: [facility];  Service: Orthopedics;  Laterality: Left;   TOTAL THYROIDECTOMY  2003   UPPER GI ENDOSCOPY  01/19/2015    FAMILY HISTORY: Family History  Problem Relation Age of Onset   Heart disease Father    Depression Father    Heart attack Father    Sudden death Father    Anxiety disorder  Father    Bipolar disorder Father    Obesity Father    Cancer Brother        THROAT cancer   Depression Brother    Anxiety disorder Brother    Depression Sister    Breast cancer Mother    Hypertension Mother    Cancer Mother    Depression Mother    Sleep apnea Mother    Obesity Mother    Varicose Veins Mother    Depression Maternal Grandmother    Stroke Maternal Grandfather    Colon cancer Brother    Stroke Paternal Grandmother    Anxiety disorder Sister    Depression Sister    Hearing loss Sister    Diabetes Brother    Hearing loss Brother    Obesity Brother    Pancreatic cancer Neg Hx    Stomach cancer Neg Hx    Liver disease Neg Hx     SOCIAL HISTORY: Social History   Socioeconomic History   Marital status: Divorced    Spouse name: Not on file   Number of children: 0   Years of education: 16   Highest education level: Bachelor's degree (e.g., BA, AB, BS)  Occupational History   Occupation: RN-Telephone Triage Nurse    Employer: [employer]    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   Drug use: No   Sexual activity: Not Currently    Partners: Male    Birth control/protection: Post-menopausal    Comment: Divorced  Other Topics Concern   Not on file  Social History Narrative   RN - [employer]   Regular exercise: no   Caffeine  use: 2 x daily   Right-handed.   Lives alone.   Social Drivers of Health   Financial Resource Strain: Patient Declined (06/10/2024)   Overall Financial Resource Strain (CARDIA)    Difficulty of  Paying Living Expenses: Patient declined  Food Insecurity: Patient Declined (06/10/2024)   Hunger Vital Sign    Worried About Running Out of Food in the Last Year: Patient declined    Barista in  the Last Year: Patient declined  Transportation Needs: Patient Declined (06/10/2024)   PRAPARE - Administrator, Civil Service (Medical): Patient declined    Lack of Transportation (Non-Medical): Patient declined  Physical Activity: Inactive (06/10/2024)   Exercise Vital Sign    Days of Exercise per Week: 0 days    Minutes of Exercise per Session: Not on file  Stress: Stress Concern Present (06/10/2024)   [employer] of Occupational Health - Occupational Stress Questionnaire    Feeling of Stress: Very much  Social Connections: Unknown (06/10/2024)   Social Connection and Isolation Panel    Frequency of Communication with Friends and Family: Twice a week    Frequency of Social Gatherings with Friends and Family: Patient declined    Attends Religious Services: Patient declined    Database Administrator or Organizations: Yes    Attends Banker Meetings: Patient declined    Marital Status: Divorced  Catering Manager Violence: Not on file      Modena Callander, M.D. Ph.D.  [facility] [address] [place], [place] [address] Ph: [phone] Fax: [phone]  CC:  Alvia Bring, DO [address] [facility] [address] [place],  [place] [address]  Ivin Snuffer, MD

## 2024-07-06 ENCOUNTER — Ambulatory Visit

## 2024-07-06 ENCOUNTER — Other Ambulatory Visit (HOSPITAL_COMMUNITY): Payer: Self-pay

## 2024-07-06 ENCOUNTER — Other Ambulatory Visit: Payer: Self-pay

## 2024-07-06 VITALS — BP 104/62 | HR 84 | Temp 97.5°F | Ht 69.0 in | Wt 263.8 lb

## 2024-07-06 DIAGNOSIS — K7581 Nonalcoholic steatohepatitis (NASH): Secondary | ICD-10-CM | POA: Diagnosis not present

## 2024-07-06 DIAGNOSIS — M25562 Pain in left knee: Secondary | ICD-10-CM

## 2024-07-06 DIAGNOSIS — M5416 Radiculopathy, lumbar region: Secondary | ICD-10-CM | POA: Diagnosis not present

## 2024-07-06 DIAGNOSIS — K7469 Other cirrhosis of liver: Secondary | ICD-10-CM | POA: Diagnosis not present

## 2024-07-06 DIAGNOSIS — E785 Hyperlipidemia, unspecified: Secondary | ICD-10-CM | POA: Diagnosis not present

## 2024-07-06 DIAGNOSIS — E1169 Type 2 diabetes mellitus with other specified complication: Secondary | ICD-10-CM

## 2024-07-06 DIAGNOSIS — Z01818 Encounter for other preprocedural examination: Secondary | ICD-10-CM | POA: Diagnosis not present

## 2024-07-06 DIAGNOSIS — G8929 Other chronic pain: Secondary | ICD-10-CM | POA: Diagnosis not present

## 2024-07-06 DIAGNOSIS — E559 Vitamin D deficiency, unspecified: Secondary | ICD-10-CM | POA: Diagnosis not present

## 2024-07-06 DIAGNOSIS — E119 Type 2 diabetes mellitus without complications: Secondary | ICD-10-CM | POA: Diagnosis not present

## 2024-07-06 DIAGNOSIS — E890 Postprocedural hypothyroidism: Secondary | ICD-10-CM | POA: Diagnosis not present

## 2024-07-06 NOTE — Assessment & Plan Note (Signed)
 PATIENT HERE FOR PREOPERATIVE EVALUATION FOR UPCOMING TLIF TO L3-L4, TO BE DONE BY DR.BROOKS AT [facility], DATE UNDECIDED YET, ANESTHESIA UNCLEAR, IF GENERAL OR SPINAL.  As per [facility] functional capacity scoring, she scored LOW AT 13.45 points The higher the score (maximum 58.2), the higher the functional status. She is at 4.40 METs for physical activity. Unable to do much due to her arthralgias and not cardiopulmonary reasons.  As per GUPTA PERIOPERATIVE RISK SCORE, she is at LOW RISK 0.1 % Risk of myocardial infarction or cardiac arrest, intraoperatively or up to 30 days post-op  As per REVISED CARDIAC RISK INDEX, she is at 0 points RCRI Score 0.5 % Risk of major cardiac event  OVERALL, SHE IS AT LOW RISK OF COMPLICATIONS FOR THIS NON CARDIAC SPINAL SURGERY if planned to be done under spinal anesthesia. Low to moderate risk if planning general anesthesia due to her low functional capacity and morbid obesity.

## 2024-07-06 NOTE — Assessment & Plan Note (Signed)
 Lumbar radiculopathy with severe lumbar stenosis Progressive back pain with radicular symptoms in the right leg. MRI shows right foraminal/extra foraminal disc extrusion at L3-L4 with severe right foraminal stenosis and impingement of the exiting right L3 nerve root. Severe central canal stenosis at L3-L4 and moderate left foraminal stenosis at L3.  Previous ESI injection was ineffective.  Surgery-  transforaminal lumbar interbody fusion (TLIF) at L3-L4. Was recommended by ortho to address neurological deficits and radicular pain.   Discussed risks of surgery, including potential complications due to diabetes and low functional capacity.   Unsure if the surgery is going to be performed under spinal or general, but Spinal anesthesia preferred due to lower risk compared to general anesthesia.   - Proceed with transforaminal lumbar interbody fusion (TLIF) at L3-L4. - Ordered blood work to assess current health status.

## 2024-07-06 NOTE — Patient Instructions (Signed)
  VISIT SUMMARY: You visited us  today for back pain and to get clearance for surgery. We discussed your lumbar radiculopathy, diabetes, hypothyroidism, and other health concerns. We have planned a surgery to address your back pain and provided guidance on managing your other conditions.  YOUR PLAN: LUMBAR RADICULOPATHY WITH SEVERE LUMBAR STENOSIS: You have progressive back pain with symptoms in your right leg, confirmed by MRI. Surgery is recommended to address these issues. -Proceed with transforaminal lumbar interbody fusion (TLIF) at L3-L4. -Blood work ordered to assess current health status.  CHRONIC PAIN OF LEFT KNEE: Your knee replacement surgery was canceled due to worsening back pain. We will address your knee pain after your back surgery. -Focus on treating lumbar radiculopathy first before considering knee surgery.  LIVER CIRRHOSIS SECONDARY TO NONALCOHOLIC STEATOHEPATITIS (NASH): Your liver condition is stable with regular monitoring. -Continue regular monitoring with ultrasound every six months.  POSTSURGICAL HYPOTHYROIDISM: Your thyroid  function is abnormal, likely due to inconsistent medication use. -Ordered thyroid  function tests. -Take your thyroid  medication consistently.  TYPE 2 DIABETES MELLITUS: Your diabetes is well-controlled with Mounjaro . -Continue Mounjaro  for diabetes management. -Monitor A1c levels regularly.  MORBID OBESITY: Your weight is contributing to surgical risk and functional limitations. -Weight management is crucial for improving surgical outcomes and overall health.  VITAMIN D  DEFICIENCY: Your vitamin D  levels were low in your last blood work. -Ordered vitamin D  level test.                      Contains text generated by [employer].                                 Contains text generated by [employer].

## 2024-07-06 NOTE — Assessment & Plan Note (Signed)
 Chronic pain of left knee Knee replacement surgery canceled due to ineffective ESI injection and worsening back pain. Knee pain management deferred until back surgery is completed. - Address lumbar radiculopathy first before considering knee surgery.

## 2024-07-06 NOTE — Assessment & Plan Note (Signed)
 WITHOUT LONG TERM USE OF INSULIN .  Diabetes is well-controlled with Mounjaro . Previous A1c levels were high, but currently well-controlled. No neuropathy or retinopathy reported. - Continue Mounjaro  for diabetes management. - Monitor A1c levels regularly. Orders:   CBC with Differential/Platelet   Comprehensive metabolic panel with GFR   Hemoglobin A1c

## 2024-07-06 NOTE — Assessment & Plan Note (Signed)
 Class 2 severe obesity with BMI of 38.9 with comorbidities of Non alcoholic cirrhosis, Diabetes type 2 and hyperlipidemia. She has been successfully and steadily losing weight with healthy eating and Mounjaro . Unable to exercise much due to her back pain.

## 2024-07-06 NOTE — Assessment & Plan Note (Addendum)
 Liver cirrhosis secondary to nonalcoholic steatohepatitis (NASH) Cirrhosis is well-compensated without complications at this time, with regular monitoring. - Continue regular monitoring with ultrasound every six months. Orders:   CBC with Differential/Platelet

## 2024-07-06 NOTE — Assessment & Plan Note (Signed)
 Postsurgical hypothyroidism Thyroid  function abnormal with TSH at 6.6, likely due to non-adherence to medication. Previous A1c levels were high, but currently well-controlled with Mounjaro . - Ordered thyroid  function tests. - Emphasized importance of taking thyroid  medication consistently. Orders:   TSH

## 2024-07-06 NOTE — Progress Notes (Signed)
 Subjective:  Patient ID: Tracy [name], female    DOB: 21-Jan-1966  Age: 58 y.o. MRN: [id number]  Chief Complaint  Patient presents with   surgical clearance    HPI: Discussed the use of AI scribe software for clinical note transcription with the patient, who gave verbal consent to proceed.   Discussed the use of AI scribe software for clinical note transcription with the patient, who gave verbal consent to proceed.  History of Present Illness   Tracy [name] is a 58 year old female who presents with back pain and surgical clearance.  Lumbar radiculopathy and back pain - Progressive back pain with radicular pain in the right leg, worsening over time - MRI on June 27, 2024: right foraminal and extra foraminal disc extrusion at L3-L4, severe right foraminal stenosis, impingement of the exiting right L3 nerve root - Severe central canal stenosis at L3-L4 - Left foraminal disc protrusion with moderate left foraminal stenosis at the left L3 nerve root - Tingling in the lower extremities and sensory abnormalities in the lower extremities - No neuropathy in the feet  Functional limitations - Limited ability to perform strenuous activities due to pain - Able to do light to moderate work around the house - Not sexually active - No participation in recreational sports  Planned orthopedic surgery - Previously scheduled for left total knee arthroplasty, canceled due to ineffectiveness of epidural steroid injection and worsening back pain  Diabetes mellitus - Well-controlled with Mounjaro  - No issues with vision - Not on insulin   Dyslipidemia - History of high cholesterol - Not consistently taking medication for hyperlipidemia - Last blood work in July showed high cholesterol  Hypothyroidism - History of hypothyroidism - Sometimes skips medication - Last blood work in July showed abnormal thyroid  level  Vitamin d  deficiency - Last blood work in July showed low vitamin  D  Migraine headaches - History of migraines - Currently taking Qulipta  60 mg daily and Imitrex  as needed - Migraines are well-managed with current regimen - Scheduled neurology follow-up in one year  Cardiovascular and cerebrovascular history - No history of heart attack, stroke, or congestive heart failure - Not on blood thinners  Medication use - Not on NSAIDs           07/06/2024    8:25 AM 03/30/2024    1:33 PM 03/03/2024    3:06 PM 07/30/2022   11:29 AM 09/04/2021    1:08 PM  Depression screen PHQ 2/9  Decreased Interest 3 2 1 1    Down, Depressed, Hopeless 2 2 0 2   PHQ - 2 Score 5 4 1 3    Altered sleeping 3 3 3  0   Tired, decreased energy 3 3 2 2    Change in appetite 0 0 1 2   Feeling bad or failure about yourself  2 0 0 3   Trouble concentrating 1 1 1 1    Moving slowly or fidgety/restless 0 0 0 0   Suicidal thoughts 0 0 0 0   PHQ-9 Score 14 11  8  11     Difficult doing work/chores Somewhat difficult Somewhat difficult Somewhat difficult Somewhat difficult      Information is confidential and restricted. Go to Review Flowsheets to unlock data.   Data saved with a previous flowsheet row definition        07/06/2024    8:25 AM  Fall Risk   Falls in the past year? 1  Number falls in past yr: 1  Injury with Fall? 1  Risk for fall due to : History of fall(s)  Follow up Falls evaluation completed;Education provided;Falls prevention discussed    Patient Care Team: Alexsandra Shontz, MD as PCP - General (Family Medicine) Norval Toribio CROME, MD (Inactive) (Obstetrics and Gynecology) Vincente Grip, MD as Attending Physician (Psychiatry) Darden Planas, NP as Nurse Practitioner Claudene Arthea HERO, DO as Consulting Physician (Family Medicine)   Review of Systems  Constitutional:  Negative for chills, fatigue and fever.  HENT:  Negative for congestion, ear pain, sinus pressure and sore throat.   Respiratory:  Negative for cough and shortness of breath.    Cardiovascular:  Negative for chest pain.  Gastrointestinal:  Negative for abdominal pain, constipation, diarrhea, nausea and vomiting.  Genitourinary:  Negative for dysuria and frequency.  Musculoskeletal:  Positive for arthralgias and back pain. Negative for myalgias.  Neurological:  Positive for numbness. Negative for dizziness and headaches.  Psychiatric/Behavioral:  Negative for dysphoric mood. The patient is not nervous/anxious.     Current Outpatient Medications on File Prior to Visit  Medication Sig Dispense Refill   albuterol  (VENTOLIN  HFA) 108 (90 Base) MCG/ACT inhaler Inhale 2 puffs into the lungs every 6 (six) hours as needed for wheezing or shortness of breath. 8 g 2   AMBULATORY NON FORMULARY MEDICATION Rollator for use daily. Elevated toilet seat with handlebars 1 each 0   Armodafinil  200 MG TABS Take 1 tablet (200 mg total) by mouth daily as needed (sleepiness). 30 tablet 2   Atogepant  (QULIPTA ) 60 MG TABS Take 1 tablet (60 mg total) by mouth daily. 90 tablet 3   clobetasol  ointment (TEMOVATE ) 0.05 % Apply topically 2 (two) times daily. 60 g 6   clonazePAM  (KLONOPIN ) 1 MG tablet Take 1 tablet (1 mg total) by mouth 3 (three) times daily. 90 tablet 2   collagenase  (SANTYL ) 250 UNIT/GM ointment Apply 1 Application topically daily. 30 g 0   Continuous Glucose Sensor (DEXCOM G7 SENSOR) MISC Use to check glucose throughout the day.  Change every 10 days 9 each 1   cyclobenzaprine  (FLEXERIL ) 10 MG tablet Take 1 tablet (10 mg total) by mouth 3 (three) times daily as needed for muscle spasms. 30 tablet 1   fluconazole  (DIFLUCAN ) 150 MG tablet Take 300mg  (2 pills) today and then 150mg  (1 pill) for each of the following three days. 5 tablet 0   furosemide  (LASIX ) 40 MG tablet Take 1 tablet (40 mg total) by mouth daily. 90 tablet 0   glucose blood (FREESTYLE LITE) test strip Check fasting blood sugar every morning and before meals up to 4 times a day as directed. 100 strip 12    hydrOXYzine  (ATARAX ) 25 MG tablet Take 1 tablet (25 mg total) by mouth 3 (three) times daily as needed for anxiety. 270 tablet 1   Insulin  Pen Needle (UNIFINE PENTIPS) 31G X 5 MM MISC Use as directed to inject insulin  100 each 3   lamoTRIgine  (LAMICTAL ) 200 MG tablet Take 2 tablets (400 mg total) by mouth at bedtime.     Lancets 30G MISC Check fasting blood sugar every morning and before meals up to 4 times daily. 100 each 12   lidocaine  (XYLOCAINE ) 5 % ointment Apply topically to affected areas 3 times daily as needed. 35.44 g 0   lisinopril  (ZESTRIL ) 5 MG tablet Take 1 tablet (5 mg total) by mouth daily. 90 tablet 2   Menthol , Topical Analgesic, (BENGAY EX) Apply 1 Application topically daily as needed (pain).  mycophenolate  (CELLCEPT ) 500 MG tablet Take 2 tablets (1,000mg ) twice per day; if you are tolerating it well after 2 weeks and labs look good increase to 3 tablets (1,500mg ) twice per day 120 tablet 2   mycophenolate  (CELLCEPT ) 500 MG tablet Take 3 tablets (1,500 mg total) by mouth every 12 (twelve) hours. 540 tablet 0   neomycin-bacitracin -polymyxin [phone] [id number] OINT Apply topically 2 (two) times daily for 10 days To corners of mouth 14 g 2   nystatin  (NYAMYC ) powder Apply 3 times daily as directed. 60 g 3   ondansetron  (ZOFRAN -ODT) 4 MG disintegrating tablet Dissolve 1 tablet (4 mg total) by mouth 2 (two) times daily as needed for nausea or vomiting. 20 tablet 1   pantoprazole  (PROTONIX ) 40 MG tablet Take 2 tablets (80 mg total) by mouth 2 (two) times daily. 60 tablet 3   pregabalin  (LYRICA ) 75 MG capsule Take 1 capsule (75 mg total) by mouth in the morning AND 1 capsule (75 mg total) daily at 12 noon AND 2 capsules (150 mg total) at bedtime. 120 capsule 5   risperiDONE  (RISPERDAL ) 1 MG tablet Take 1 tablet (1 mg total) by mouth at bedtime. 30 tablet 0   risperiDONE  (RISPERDAL ) 2 MG tablet Take 1 tablet (2 mg total) by mouth at bedtime. 90 tablet 1   rosuvastatin  (CRESTOR ) 20 MG tablet  Take 1 tablet (20 mg total) by mouth daily. 90 tablet 3   sertraline  (ZOLOFT ) 100 MG tablet Take 2 tablets (200 mg total) by mouth every morning. 180 tablet 3   spironolactone  (ALDACTONE ) 50 MG tablet Take 1 tablet (50 mg total) by mouth 2 (two) times daily. 180 tablet 2   SUMAtriptan  (IMITREX ) 100 MG tablet Take 1 tablet (100 mg total) by mouth every 2 (two) hours as needed for migraine. May repeat in 2 hours if headache persists or recurs. 10 tablet 11   SYNTHROID  200 MCG tablet Take 1 tablet (200 mcg total) by mouth daily before breakfast. 90 tablet 1   tacrolimus  (PROGRAF ) 1 MG capsule Open two capsules and dissolve in (0.5 liters) of water . Label well and keep in fridge. Swish and spit 10mL twice a day. 30 capsule 2   tirzepatide  (MOUNJARO ) 15 MG/0.5ML Pen Inject 15 mg into the skin once a week. 6 mL 2   traMADol  (ULTRAM ) 50 MG tablet Take 1 tablet (50 mg total) by mouth daily as needed for moderate pain (pain score 4-6). 30 tablet 0   traMADol  (ULTRAM ) 50 MG tablet Take 1 tablet every 6-8 hours by oral route as needed for 7 days, for pain. 28 tablet 0   traZODone  (DESYREL ) 100 MG tablet Take 1 tablet (100 mg total) by mouth at bedtime. (Patient not taking: Reported on 07/06/2024)     Current Facility-Administered Medications on File Prior to Visit  Medication Dose Route Frequency Provider Last Rate Last Admin   botulinum toxin Type A  (BOTOX ) injection 155 Units  155 Units Intramuscular Once Onita Duos, MD       Past Medical History:  Diagnosis Date   Acanthosis nigricans, acquired 02/09/2015   Allergy ?   Anemia    Anxiety and depression 10/31/2014   Arthritis    back, knees, right elbow   Arthritis of left hip 06/25/2018   Bilateral swelling of feet and ankles    Bipolar disorder (HCC)    Borderline personality disorder (HCC)    Cancer (HCC)    oral lichen planus, uses mouth wash, cream   Carpal tunnel syndrome  09/06/2015   Chewing difficulty    Chronic left shoulder pain  05/17/2020   Constipation    Coronary artery disease 03/21/2021   COVID 10/08/2021   Cystic teratoma    BENIGN   Dental crowns present    Diabetes mellitus without complication (HCC)    Diabetes mellitus, type 2 (HCC) 06/10/2013   Diarrhea    Diverticulosis of colon without hemorrhage    DOE (dyspnea on exertion) 03/21/2021   Dyspepsia 12/14/2020   Essential hypertension 10/02/2007   Qualifier: Diagnosis of  By: Kassie MD, Alyce A    Facial rash 06/16/2019   Female hirsutism 02/09/2015   Food allergy    Walnuts   GAD (generalized anxiety disorder)    Gastritis and gastroduodenitis    Gastroesophageal reflux disease    Grade II internal hemorrhoids    Greater trochanteric bursitis of left hip 07/24/2018   Hemorrhoids, internal 07/18/2017   History of kidney stones    History of migraine headaches    History of posttraumatic stress disorder (PTSD)    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/2015   Hyperlipidemia    Hypothyroidism    HYPOTHYROIDISM, POSTSURGICAL 10/02/2007   Qualifier: Diagnosis of  By: Kassie MD, Sean A    IBS (irritable bowel syndrome)    Infertility associated with anovulation 02/09/2015   Insulin  resistance 02/09/2015   Joint pain    Leg swelling 09/21/2020   Lichen planus    Liver cirrhosis secondary to NASH (nonalcoholic steatohepatitis) (HCC)    Lower back pain 07/18/2017   MDD (major depressive disorder) 06/06/2015   Migraine 10/02/2007   Morbid obesity with BMI of 40.0-44.9, adult (HCC) 06/23/2020   NASH (nonalcoholic steatohepatitis)    Nausea without vomiting    Nonalcoholic fatty liver disease 02/09/2015   Obstructive sleep apnea 06/27/2014   Oral mucosal lesion 06/17/2019   Osteoarthritis    Other fatigue    PCOS (polycystic ovarian syndrome)    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   Qualifier: Diagnosis of  By: Kassie MD, Sean A    Trigeminal neuralgia    Unilateral primary osteoarthritis, left hip 02/10/2019   Vitamin D  deficiency    Past Surgical History:  Procedure Laterality Date   ACHILLES TENDON SURGERY Left 2007   APPENDECTOMY     CARPAL TUNNEL RELEASE Right 01/12/2016   Procedure: RIGHT CARPAL TUNNEL RELEASE;  Surgeon: Elsie Mussel, MD;  Location: [facility];  Service: Orthopedics;  Laterality: Right;   COLONOSCOPY WITH PROPOFOL  N/A 04/20/2014   Procedure: COLONOSCOPY WITH PROPOFOL ;  Surgeon: Elsie Cree, MD;  Location: [facility];  Service: Endoscopy;  Laterality: N/A;   COLONOSCOPY WITH PROPOFOL  N/A 01/10/2021   Procedure: COLONOSCOPY WITH PROPOFOL ;  Surgeon: San Sandor GAILS, DO;  Location: [facility];  Service: Gastroenterology;  Laterality: N/A;  ESOPHAGOGASTRODUODENOSCOPY (EGD) WITH PROPOFOL  N/A 04/20/2014   Procedure: ESOPHAGOGASTRODUODENOSCOPY (EGD) WITH PROPOFOL ;  Surgeon: Elsie Cree, MD;  Location: [facility];  Service: Endoscopy;  Laterality: N/A;   ESOPHAGOGASTRODUODENOSCOPY (EGD) WITH PROPOFOL  N/A 01/10/2021   Procedure: ESOPHAGOGASTRODUODENOSCOPY (EGD) WITH PROPOFOL ;  Surgeon: San Sandor GAILS, DO;  Location: [facility];  Service: Gastroenterology;  Laterality: N/A;   EXCISION HAGLUND'S DEFORMITY WITH ACHILLES TENDON REPAIR Right 02/25/2013   Procedure: RIGHT ACHILLES DEBRIDEMENT AND RECONSTRUCTION;  HAGLUND'S EXCISION; GASTROC RECESSION AND FLEXOR  HALLUCIS LONGUS TRANSFER;  Surgeon: Norleen Armor, MD;  Location: [facility];  Service: Orthopedics;  Laterality: Right;   IR ANGIOGRAM SELECTIVE EACH ADDITIONAL VESSEL  05/15/2023   IR ANGIOGRAM SELECTIVE EACH ADDITIONAL VESSEL  05/15/2023   IR ANGIOGRAM SELECTIVE EACH ADDITIONAL VESSEL  05/15/2023   IR ANGIOGRAM SELECTIVE EACH ADDITIONAL VESSEL  05/15/2023   IR EMBO ARTERIAL NOT HEMORR HEMANG INC GUIDE ROADMAPPING  05/15/2023   IR RADIOLOGIST EVAL & MGMT  04/16/2023   IR US  GUIDE VASC ACCESS LEFT  05/15/2023   JOINT REPLACEMENT     hip   LUMBAR LAMINECTOMY     X 3   neck fusion     OVARIAN CYST SURGERY Left 1998   PILONIDAL CYST EXCISION  1994   RIGHT OOPHORECTOMY     SHOULDER ARTHROSCOPY WITH ROTATOR CUFF REPAIR Right 10/17/2022   Procedure: Right shoulder arthroscopy, debridement, subacromial decompression, distal clavicle resection, rotator cuff repair;  Surgeon: Melita Drivers, MD;  Location: [facility];  Service: Orthopedics;  Laterality: Right;    SPINE SURGERY     STERIOD INJECTION Left 01/12/2016   Procedure: STEROID INJECTION LEFT WRIST;  Surgeon: Elsie Mussel, MD;  Location: [facility];  Service: Orthopedics;  Laterality: Left;   TOTAL HIP ARTHROPLASTY Left 03/19/2019   Procedure: LEFT TOTAL HIP ARTHROPLASTY ANTERIOR APPROACH;  Surgeon: Vernetta Lonni GRADE, MD;  Location: [facility];  Service: Orthopedics;  Laterality: Left;   TOTAL THYROIDECTOMY  2003   UPPER GI ENDOSCOPY  01/19/2015    Family History  Problem Relation Age of Onset   Heart disease Father    Depression Father    Heart attack Father    Sudden death Father    Anxiety disorder Father    Bipolar disorder Father    Obesity Father    Cancer Brother        THROAT cancer   Depression Brother    Anxiety disorder Brother    Depression Sister    Breast cancer Mother    Hypertension Mother    Cancer Mother    Depression Mother    Sleep apnea Mother    Obesity Mother     Varicose Veins Mother    Depression Maternal Grandmother    Stroke Maternal Grandfather    Colon cancer Brother    Stroke Paternal Grandmother    Anxiety disorder Sister    Depression Sister    Hearing loss Sister    Diabetes Brother    Hearing loss Brother    Obesity Brother    Pancreatic cancer Neg Hx    Stomach cancer Neg Hx    Liver disease Neg Hx    Social History   Socioeconomic History   Marital status: Divorced    Spouse name: Not on file   Number of children: 0   Years of education: 16   Highest education level: Bachelor's degree (e.g., BA, AB, BS)  Occupational History   Occupation: RN-Telephone Triage Nurse    Employer:  [employer]    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   Drug use: No   Sexual activity: Not Currently    Partners: Male    Birth control/protection: Post-menopausal    Comment: Divorced  Other Topics Concern   Not on file  Social History Narrative   RN - [employer]   Regular exercise: no   Caffeine  use: 2 x daily   Right-handed.   Lives alone.   Social Drivers of Health   Financial Resource Strain: Patient Declined (06/10/2024)   Overall Financial Resource Strain (CARDIA)    Difficulty of Paying Living Expenses: Patient declined  Food Insecurity: Patient Declined (06/10/2024)   Hunger Vital Sign    Worried About Running Out of Food in the Last Year: Patient declined    Ran Out of Food in the Last Year: Patient declined  Transportation Needs: Patient Declined (06/10/2024)   PRAPARE - Therapist, Art (Medical): Patient declined    Lack of Transportation (Non-Medical): Patient declined  Physical Activity: Inactive (06/10/2024)   Exercise Vital Sign    Days of Exercise per Week: 0 days    Minutes of Exercise per Session: Not on file  Stress: Stress Concern Present (06/10/2024)   [employer] of Occupational Health - Occupational Stress Questionnaire    Feeling of Stress: Very much  Social Connections: Unknown (06/10/2024)   Social Connection and Isolation Panel    Frequency of Communication with Friends and Family: Twice a week    Frequency of Social Gatherings with Friends and Family: Patient declined    Attends Religious Services: Patient declined    Database Administrator or Organizations: Yes    Attends Engineer, Structural: Patient declined    Marital Status: Divorced    Objective:  BP 104/62   Pulse 84   Temp (!) 97.5 F (36.4 C)   Ht 5' 9 (1.753 m)   Wt 263 lb 12.8 oz (119.7 kg)   LMP 08/24/2020   SpO2 98%   BMI 38.96 kg/m      07/06/2024    8:22 AM 07/05/2024    2:49 PM 06/10/2024    9:56 AM  BP/Weight  Systolic BP 104 104 110  Diastolic BP 62 70 80  Wt. (Lbs) 263.8 265 272  BMI 38.96 kg/m2 39.13 kg/m2 37.94 kg/m2    Physical Exam Vitals and nursing note reviewed.  Constitutional:      Appearance: She is obese.  HENT:     Head: Normocephalic and atraumatic.  Cardiovascular:     Rate and Rhythm: Normal rate and regular rhythm.  Pulmonary:     Effort: Pulmonary effort is normal.     Breath sounds: Normal breath sounds.  Musculoskeletal:        General: Tenderness (lumbar spinal) present.  Neurological:     Mental Status: She is alert and oriented to person, place, and time.  Psychiatric:  Mood and Affect: Mood normal.         Lab Results  Component Value Date   WBC 4.1 05/17/2024   HGB 12.7 05/17/2024   HCT 39.0 05/17/2024   PLT 169 05/17/2024   GLUCOSE 92 03/03/2024   CHOL 290 (H) 03/03/2024    TRIG 221 (H) 03/03/2024   HDL 45 03/03/2024   LDLCALC 202 (H) 03/03/2024   ALT 18 03/03/2024   AST 18 03/03/2024   NA 136 03/03/2024   K 4.2 03/03/2024   CL 98 03/03/2024   CREATININE 1.07 (H) 03/03/2024   BUN 13 03/03/2024   CO2 20 03/03/2024   TSH 6.620 (H) 03/03/2024   INR 1.0 05/15/2023   HGBA1C 5.5 03/03/2024    Results for orders placed or performed in visit on 05/21/24  Lab report - scanned   Collection Time: 04/12/24  3:02 PM  Result Value Ref Range   EGFR 86.0    *Note: Due to a large number of results and/or encounters for the requested time period, some results have not been displayed. A complete set of results can be found in Results Review.  .  Assessment & Plan:   Assessment & Plan Lumbar radiculopathy Lumbar radiculopathy with severe lumbar stenosis Progressive back pain with radicular symptoms in the right leg. MRI shows right foraminal/extra foraminal disc extrusion at L3-L4 with severe right foraminal stenosis and impingement of the exiting right L3 nerve root. Severe central canal stenosis at L3-L4 and moderate left foraminal stenosis at L3.  Previous ESI injection was ineffective.  Surgery-  transforaminal lumbar interbody fusion (TLIF) at L3-L4. Was recommended by ortho to address neurological deficits and radicular pain.   Discussed risks of surgery, including potential complications due to diabetes and low functional capacity.   Unsure if the surgery is going to be performed under spinal or general, but Spinal anesthesia preferred due to lower risk compared to general anesthesia.   - Proceed with transforaminal lumbar interbody fusion (TLIF) at L3-L4. - Ordered blood work to assess current health status.    Preoperative evaluation to rule out surgical contraindication PATIENT HERE FOR PREOPERATIVE EVALUATION FOR UPCOMING TLIF TO L3-L4, TO BE DONE BY DR.BROOKS AT [facility], DATE UNDECIDED YET, ANESTHESIA UNCLEAR, IF GENERAL OR SPINAL.  As per  [facility] functional capacity scoring, she scored LOW AT 13.45 points The higher the score (maximum 58.2), the higher the functional status. She is at 4.40 METs for physical activity. Unable to do much due to her arthralgias and not cardiopulmonary reasons.  As per GUPTA PERIOPERATIVE RISK SCORE, she is at LOW RISK 0.1 % Risk of myocardial infarction or cardiac arrest, intraoperatively or up to 30 days post-op  As per REVISED CARDIAC RISK INDEX, she is at 0 points RCRI Score 0.5 % Risk of major cardiac event  OVERALL, SHE IS AT LOW RISK OF COMPLICATIONS FOR THIS NON CARDIAC SPINAL SURGERY if planned to be done under spinal anesthesia. Low to moderate risk if planning general anesthesia due to her low functional capacity and morbid obesity.    Chronic pain of left knee Chronic pain of left knee Knee replacement surgery canceled due to ineffective ESI injection and worsening back pain. Knee pain management deferred until back surgery is completed. - Address lumbar radiculopathy first before considering knee surgery.    Liver cirrhosis secondary to NASH (nonalcoholic steatohepatitis) (HCC) Liver cirrhosis secondary to nonalcoholic steatohepatitis (NASH) Cirrhosis is well-compensated without complications at this time, with regular monitoring. - Continue regular monitoring with  ultrasound every six months. Orders:   CBC with Differential/Platelet  HYPOTHYROIDISM, POSTSURGICAL Postsurgical hypothyroidism Thyroid  function abnormal with TSH at 6.6, likely due to non-adherence to medication. Previous A1c levels were high, but currently well-controlled with Mounjaro . - Ordered thyroid  function tests. - Emphasized importance of taking thyroid  medication consistently. Orders:   TSH  Vitamin D  deficiency - Ordered vitamin D  level test.  Orders:   VITAMIN D  25 Hydroxy (Vit-D Deficiency, Fractures)  Morbid obesity (HCC) Class 2 severe obesity with BMI of 38.9 with comorbidities of Non  alcoholic cirrhosis, Diabetes type 2 and hyperlipidemia. She has been successfully and steadily losing weight with healthy eating and Mounjaro . Unable to exercise much due to her back pain.    Diabetes mellitus type 2, controlled, without complications (HCC) WITHOUT LONG TERM USE OF INSULIN .  Diabetes is well-controlled with Mounjaro . Previous A1c levels were high, but currently well-controlled. No neuropathy or retinopathy reported. - Continue Mounjaro  for diabetes management. - Monitor A1c levels regularly. Orders:   CBC with Differential/Platelet   Comprehensive metabolic panel with GFR   Hemoglobin A1c  Hyperlipidemia associated with type 2 diabetes mellitus (HCC) Has been missing her cholesterol medicine as she takes it at night and misses her night pills. Advised to take it in the morning if night time does not work. Orders:   CBC with Differential/Platelet   Lipid panel    Body mass index is 38.96 kg/m.         No orders of the defined types were placed in this encounter.   Orders Placed This Encounter  Procedures   CBC with Differential/Platelet   Comprehensive metabolic panel with GFR   Hemoglobin A1c   Lipid panel   VITAMIN D  25 Hydroxy (Vit-D Deficiency, Fractures)   TSH       Follow-up: Return in about 8 weeks (around 08/31/2024) for chronic disease follow up.  An After Visit Summary was printed and given to the patient.  Berkley Cronkright, MD [facility] [phone]

## 2024-07-06 NOTE — Assessment & Plan Note (Signed)
-   Ordered vitamin D  level test.  Orders:   VITAMIN D  25 Hydroxy (Vit-D Deficiency, Fractures)

## 2024-07-07 ENCOUNTER — Other Ambulatory Visit: Payer: Self-pay

## 2024-07-07 ENCOUNTER — Other Ambulatory Visit (HOSPITAL_COMMUNITY): Payer: Self-pay

## 2024-07-07 LAB — CBC WITH DIFFERENTIAL/PLATELET
Basophils Absolute: 0 x10E3/uL (ref 0.0–0.2)
Basos: 0 %
EOS (ABSOLUTE): 0 x10E3/uL (ref 0.0–0.4)
Eos: 1 %
Hematocrit: 44.2 % (ref 34.0–46.6)
Hemoglobin: 14.2 g/dL (ref 11.1–15.9)
Immature Grans (Abs): 0 x10E3/uL (ref 0.0–0.1)
Immature Granulocytes: 0 %
Lymphocytes Absolute: 1.7 x10E3/uL (ref 0.7–3.1)
Lymphs: 37 %
MCH: 29.8 pg (ref 26.6–33.0)
MCHC: 32.1 g/dL (ref 31.5–35.7)
MCV: 93 fL (ref 79–97)
Monocytes Absolute: 0.3 x10E3/uL (ref 0.1–0.9)
Monocytes: 6 %
Neutrophils Absolute: 2.5 x10E3/uL (ref 1.4–7.0)
Neutrophils: 56 %
Platelets: 186 x10E3/uL (ref 150–450)
RBC: 4.76 x10E6/uL (ref 3.77–5.28)
RDW: 13.7 % (ref 11.7–15.4)
WBC: 4.5 x10E3/uL (ref 3.4–10.8)

## 2024-07-07 LAB — TSH: TSH: 3.9 u[IU]/mL (ref 0.450–4.500)

## 2024-07-07 LAB — COMPREHENSIVE METABOLIC PANEL WITH GFR
ALT: 12 IU/L (ref 0–32)
AST: 19 IU/L (ref 0–40)
Albumin: 4.2 g/dL (ref 3.8–4.9)
Alkaline Phosphatase: 97 IU/L (ref 49–135)
BUN/Creatinine Ratio: 11 (ref 9–23)
BUN: 10 mg/dL (ref 6–24)
Bilirubin Total: 0.4 mg/dL (ref 0.0–1.2)
CO2: 23 mmol/L (ref 20–29)
Calcium: 9.5 mg/dL (ref 8.7–10.2)
Chloride: 105 mmol/L (ref 96–106)
Creatinine, Ser: 0.89 mg/dL (ref 0.57–1.00)
Globulin, Total: 2.3 g/dL (ref 1.5–4.5)
Glucose: 79 mg/dL (ref 70–99)
Potassium: 3.8 mmol/L (ref 3.5–5.2)
Sodium: 142 mmol/L (ref 134–144)
Total Protein: 6.5 g/dL (ref 6.0–8.5)
eGFR: 76 mL/min/1.73 (ref >59)

## 2024-07-07 LAB — LIPID PANEL
Chol/HDL Ratio: 6.6 ratio — ABNORMAL HIGH (ref 0.0–4.4)
Cholesterol, Total: 211 mg/dL — ABNORMAL HIGH (ref 100–199)
HDL: 32 mg/dL — ABNORMAL LOW (ref >39)
LDL Chol Calc (NIH): 145 mg/dL — ABNORMAL HIGH (ref 0–99)
Triglycerides: 188 mg/dL — ABNORMAL HIGH (ref 0–149)
VLDL Cholesterol Cal: 34 mg/dL (ref 5–40)

## 2024-07-07 LAB — HEMOGLOBIN A1C
Est. average glucose Bld gHb Est-mCnc: 88 mg/dL
Hgb A1c MFr Bld: 4.7 % — ABNORMAL LOW (ref 4.8–5.6)

## 2024-07-07 LAB — VITAMIN D 25 HYDROXY (VIT D DEFICIENCY, FRACTURES): Vit D, 25-Hydroxy: 31 ng/mL (ref 30.0–100.0)

## 2024-07-08 ENCOUNTER — Telehealth: Admitting: Adult Health

## 2024-07-08 ENCOUNTER — Encounter: Payer: Self-pay | Admitting: Adult Health

## 2024-07-08 ENCOUNTER — Ambulatory Visit: Payer: Self-pay

## 2024-07-08 DIAGNOSIS — F410 Panic disorder [episodic paroxysmal anxiety] without agoraphobia: Secondary | ICD-10-CM | POA: Diagnosis not present

## 2024-07-08 DIAGNOSIS — F339 Major depressive disorder, recurrent, unspecified: Secondary | ICD-10-CM

## 2024-07-08 DIAGNOSIS — F4310 Post-traumatic stress disorder, unspecified: Secondary | ICD-10-CM | POA: Diagnosis not present

## 2024-07-08 DIAGNOSIS — F411 Generalized anxiety disorder: Secondary | ICD-10-CM | POA: Diagnosis not present

## 2024-07-08 DIAGNOSIS — G4733 Obstructive sleep apnea (adult) (pediatric): Secondary | ICD-10-CM

## 2024-07-08 DIAGNOSIS — F332 Major depressive disorder, recurrent severe without psychotic features: Secondary | ICD-10-CM

## 2024-07-08 DIAGNOSIS — F329 Major depressive disorder, single episode, unspecified: Secondary | ICD-10-CM | POA: Diagnosis not present

## 2024-07-08 MED ORDER — LAMOTRIGINE 200 MG PO TABS
200.0000 mg | ORAL_TABLET | Freq: Two times a day (BID) | ORAL | 1 refills | Status: AC
  Filled 2024-07-08: qty 180, 90d supply, fill #0

## 2024-07-08 MED ORDER — ARMODAFINIL 200 MG PO TABS
200.0000 mg | ORAL_TABLET | Freq: Every day | ORAL | 2 refills | Status: AC | PRN
  Filled 2024-07-08: qty 30, 30d supply, fill #0

## 2024-07-08 MED ORDER — TRAZODONE HCL 100 MG PO TABS
150.0000 mg | ORAL_TABLET | ORAL | 1 refills | Status: AC
  Filled 2024-07-08: qty 135, 90d supply, fill #0

## 2024-07-08 MED ORDER — RISPERIDONE 2 MG PO TABS
2.0000 mg | ORAL_TABLET | Freq: Every day | ORAL | 1 refills | Status: AC
  Filled 2024-07-08: qty 90, 90d supply, fill #0

## 2024-07-08 MED ORDER — CLONAZEPAM 1 MG PO TABS
1.0000 mg | ORAL_TABLET | Freq: Three times a day (TID) | ORAL | 2 refills | Status: AC
  Filled 2024-07-08 – 2024-08-26 (×2): qty 90, 30d supply, fill #0

## 2024-07-08 MED ORDER — RISPERIDONE 1 MG PO TABS
1.0000 mg | ORAL_TABLET | Freq: Every morning | ORAL | 1 refills | Status: AC
  Filled 2024-07-08 – 2024-08-18 (×3): qty 90, 90d supply, fill #0
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
## 2024-07-09 ENCOUNTER — Other Ambulatory Visit (HOSPITAL_COMMUNITY): Payer: Self-pay

## 2024-07-09 ENCOUNTER — Other Ambulatory Visit: Payer: Self-pay

## 2024-07-12 ENCOUNTER — Other Ambulatory Visit (HOSPITAL_BASED_OUTPATIENT_CLINIC_OR_DEPARTMENT_OTHER): Payer: Self-pay

## 2024-07-12 ENCOUNTER — Other Ambulatory Visit (HOSPITAL_COMMUNITY): Payer: Self-pay

## 2024-07-12 DIAGNOSIS — M545 Low back pain, unspecified: Secondary | ICD-10-CM | POA: Diagnosis not present

## 2024-07-12 MED ORDER — TRAMADOL HCL 50 MG PO TABS
50.0000 mg | ORAL_TABLET | Freq: Four times a day (QID) | ORAL | 0 refills | Status: AC | PRN
  Filled 2024-07-12: qty 56, 14d supply, fill #0

## 2024-07-13 ENCOUNTER — Ambulatory Visit

## 2024-07-14 ENCOUNTER — Encounter (HOSPITAL_COMMUNITY)

## 2024-07-16 ENCOUNTER — Telehealth: Payer: Self-pay | Admitting: Adult Health

## 2024-07-16 DIAGNOSIS — F3181 Bipolar II disorder: Secondary | ICD-10-CM | POA: Diagnosis not present

## 2024-07-16 NOTE — Telephone Encounter (Signed)
 Pt Lvm@ 11:06a stating [employer] was going to keep her out of work longer but she didn't say how long.  She said she needs a updated letter sent to [employer] for her to be out another month because she doesn't feel ready to go to back to work.   Next appt 1/2

## 2024-07-17 NOTE — Telephone Encounter (Signed)
 Last office note states she is out through 08/12/24

## 2024-07-20 ENCOUNTER — Other Ambulatory Visit (HOSPITAL_COMMUNITY): Payer: Self-pay

## 2024-07-21 DIAGNOSIS — M47816 Spondylosis without myelopathy or radiculopathy, lumbar region: Secondary | ICD-10-CM | POA: Diagnosis not present

## 2024-07-21 DIAGNOSIS — F3181 Bipolar II disorder: Secondary | ICD-10-CM | POA: Diagnosis not present

## 2024-07-22 ENCOUNTER — Other Ambulatory Visit (HOSPITAL_BASED_OUTPATIENT_CLINIC_OR_DEPARTMENT_OTHER): Payer: Self-pay

## 2024-07-22 ENCOUNTER — Other Ambulatory Visit: Payer: Self-pay

## 2024-07-26 ENCOUNTER — Other Ambulatory Visit (HOSPITAL_COMMUNITY): Payer: Self-pay

## 2024-07-26 MED ORDER — HYDROCODONE-ACETAMINOPHEN 5-325 MG PO TABS
1.0000 | ORAL_TABLET | Freq: Four times a day (QID) | ORAL | 0 refills | Status: AC | PRN
  Filled 2024-07-26 (×2): qty 28, 7d supply, fill #0

## 2024-07-27 ENCOUNTER — Ambulatory Visit (HOSPITAL_COMMUNITY): Admit: 2024-07-27 | Admitting: Orthopedic Surgery

## 2024-07-27 SURGERY — ARTHROPLASTY, KNEE, TOTAL
Anesthesia: Spinal | Site: Knee | Laterality: Left

## 2024-08-04 ENCOUNTER — Other Ambulatory Visit (HOSPITAL_COMMUNITY): Payer: Self-pay

## 2024-08-04 ENCOUNTER — Other Ambulatory Visit: Payer: Self-pay | Admitting: Family Medicine

## 2024-08-04 ENCOUNTER — Other Ambulatory Visit: Payer: Self-pay

## 2024-08-04 ENCOUNTER — Other Ambulatory Visit (HOSPITAL_BASED_OUTPATIENT_CLINIC_OR_DEPARTMENT_OTHER): Payer: Self-pay

## 2024-08-06 ENCOUNTER — Telehealth: Admitting: Adult Health

## 2024-08-06 ENCOUNTER — Other Ambulatory Visit (HOSPITAL_BASED_OUTPATIENT_CLINIC_OR_DEPARTMENT_OTHER): Payer: Self-pay

## 2024-08-06 ENCOUNTER — Telehealth: Payer: Self-pay | Admitting: Adult Health

## 2024-08-06 ENCOUNTER — Encounter: Payer: Self-pay | Admitting: Adult Health

## 2024-08-06 DIAGNOSIS — F411 Generalized anxiety disorder: Secondary | ICD-10-CM | POA: Diagnosis not present

## 2024-08-06 DIAGNOSIS — F410 Panic disorder [episodic paroxysmal anxiety] without agoraphobia: Secondary | ICD-10-CM | POA: Diagnosis not present

## 2024-08-06 DIAGNOSIS — F332 Major depressive disorder, recurrent severe without psychotic features: Secondary | ICD-10-CM | POA: Diagnosis not present

## 2024-08-06 DIAGNOSIS — F4310 Post-traumatic stress disorder, unspecified: Secondary | ICD-10-CM

## 2024-08-06 NOTE — Progress Notes (Addendum)
 Tracy [name] [id number] 11-21-1965 59 y.o.  Virtual Visit via Video Note  I connected with pt @ on 08/06/2024 at 10:30 AM EST by a video enabled telemedicine application and verified that I am speaking with the correct person using two identifiers.   I discussed the limitations of evaluation and management by telemedicine and the availability of in person appointments. The patient expressed understanding and agreed to proceed.  I discussed the assessment and treatment plan with the patient. The patient was provided an opportunity to ask questions and all were answered. The patient agreed with the plan and demonstrated an understanding of the instructions.   The patient was advised to call back or seek an in-person evaluation if the symptoms worsen or if the condition fails to improve as anticipated.  I provided 25 minutes of non-face-to-face time during this encounter.  The patient was located at home.  The provider was located at [facility].   Angeline LOISE Sayers, NP   Subjective:   Patient ID:  Tracy [name] is a 59 y.o. (DOB July 07, 1966) female.  Chief Complaint: No chief complaint on file.   HPI Tracy [name] presents for follow-up of MDD, GAD, PTSD, and panic attacks.  Describes mood today as better. Reports decreased tearfulness. Mood symptoms - reports decreased depression and anxiety. Denies irritability. Reports improved interest and motivation. Denies recent panic attacks. Reports decreased worry, rumination, and over thinking. Denies intrusive thoughts. Denies recent flashbacks. Reports mood has improved. Stating I feel like I'm doing better. She continues to work with therapist. Reports increased pain issues - working with pain management. Taking current medications as prescribed. Energy levels improved. Active, does not have a regular exercise routine.   Reports difficulties enjoying usual interests and activities. Lives alone with 3 dogs. Divorced. Siblings  local. Spending time with family. Attends church. Appetite decreased - eating once or twice a day. Weight loss - 263 from 310 pounds.  Reports ongoing difficulties with sleep. Averages 4 hours. Denies daytime napping. Sleep apnea - using CPAP machine - taking Nuvigil  200mg  in the morning.  Denies SI or HI. Denies AH. Denies VH.  Denies self harm. Denies substance use.  Working with Donny Payor - therapy.  Previous medication trials:  Antipsychotics: Vraylar-TD, Latuda, Abilify, Geodon, Rexulti, Lybalvi, Risperdal   Mood Stabilizers - Depakote, Lithium, Lamictal , Equetro, Topamax , Trileptal, Gabapentin   SSRI - Zoloft , Lexapro, Celexa, Prozac , Viibryd, Trintellix , Prozac   SNRI - Pristiq, Effexor , Cymbalta   Wellbutrin - allergic reaction  Anti-anxiety - Buspar, Xanax , Clonazepam , Ativan, Valium   Sleep agents - Trazadone, Ambien , Restoril   Other - Deplin, Emsam, Serzone, Doxepin, Nuvigil , Vistaril   Previous treatment: ECT, Spravato  and TMS x 2  Review of Systems:  Review of Systems  Musculoskeletal:  Negative for gait problem.  Neurological:  Negative for tremors.  Psychiatric/Behavioral:         Please refer to HPI    Medications: I have reviewed the patient's current medications.  Current Outpatient Medications  Medication Sig Dispense Refill   albuterol  (VENTOLIN  HFA) 108 (90 Base) MCG/ACT inhaler Inhale 2 puffs into the lungs every 6 (six) hours as needed for wheezing or shortness of breath. 8.5 g 2   AMBULATORY NON FORMULARY MEDICATION Rollator for use daily. Elevated toilet seat with handlebars 1 each 0   Armodafinil  200 MG TABS Take 1 tablet (200 mg total) by mouth daily as needed (sleepiness). 30 tablet 2   Atogepant  (QULIPTA ) 60 MG TABS Take 1 tablet (60 mg total) by mouth daily.  90 tablet 3   clobetasol  ointment (TEMOVATE ) 0.05 % Apply topically 2 (two) times daily. 60 g 6   clonazePAM  (KLONOPIN ) 1 MG tablet Take 1 tablet (1 mg total) by mouth 3 (three) times  daily. 90 tablet 2   collagenase  (SANTYL ) 250 UNIT/GM ointment Apply 1 Application topically daily. 30 g 0   Continuous Glucose Sensor (DEXCOM G7 SENSOR) MISC Use to check glucose throughout the day.  Change every 10 days 9 each 1   cyclobenzaprine  (FLEXERIL ) 10 MG tablet Take 1 tablet (10 mg total) by mouth 3 (three) times daily as needed for muscle spasms. 30 tablet 1   furosemide  (LASIX ) 40 MG tablet Take 1 tablet (40 mg total) by mouth daily. 90 tablet 0   glucose blood (FREESTYLE LITE) test strip Check fasting blood sugar every morning and before meals up to 4 times a day as directed. 100 strip 12   hydrOXYzine  (ATARAX ) 25 MG tablet Take 1 tablet (25 mg total) by mouth 3 (three) times daily as needed for anxiety. 270 tablet 1   Insulin  Pen Needle (UNIFINE PENTIPS) 31G X 5 MM MISC Use as directed to inject insulin  100 each 3   lamoTRIgine  (LAMICTAL ) 200 MG tablet Take 1 tablet (200 mg total) by mouth 2 (two) times daily. 180 tablet 1   Lancets 30G MISC Check fasting blood sugar every morning and before meals up to 4 times daily. 100 each 12   lidocaine  (XYLOCAINE ) 5 % ointment Apply topically to affected areas 3 times daily as needed. 35.44 g 0   lisinopril  (ZESTRIL ) 5 MG tablet Take 1 tablet (5 mg total) by mouth daily. 90 tablet 2   Menthol , Topical Analgesic, (BENGAY EX) Apply 1 Application topically daily as needed (pain).     mycophenolate  (CELLCEPT ) 500 MG tablet Take 2 tablets (1,000mg ) twice per day; if you are tolerating it well after 2 weeks and labs look good increase to 3 tablets (1,500mg ) twice per day 120 tablet 2   mycophenolate  (CELLCEPT ) 500 MG tablet Take 3 tablets (1,500 mg total) by mouth every 12 (twelve) hours. 540 tablet 0   neomycin-bacitracin -polymyxin [phone] OINT Apply topically 2 (two) times daily for 10 days To corners of mouth 14 g 2   nystatin  (NYAMYC ) powder Apply 3 times daily as directed. 60 g 3   ondansetron  (ZOFRAN -ODT) 4 MG disintegrating tablet Dissolve  1 tablet (4 mg total) by mouth 2 (two) times daily as needed for nausea or vomiting. 20 tablet 1   pantoprazole  (PROTONIX ) 40 MG tablet Take 2 tablets (80 mg total) by mouth 2 (two) times daily. 60 tablet 3   pregabalin  (LYRICA ) 75 MG capsule Take 1 capsule (75 mg total) by mouth in the morning AND 1 capsule (75 mg total) daily at 12 noon AND 2 capsules (150 mg total) at bedtime. 120 capsule 5   risperiDONE  (RISPERDAL ) 1 MG tablet Take 1 tablet (1 mg total) by mouth every morning. 90 tablet 1   risperiDONE  (RISPERDAL ) 2 MG tablet Take 1 tablet (2 mg total) by mouth at bedtime. 90 tablet 1   rosuvastatin  (CRESTOR ) 20 MG tablet Take 1 tablet (20 mg total) by mouth daily. 90 tablet 3   sertraline  (ZOLOFT ) 100 MG tablet Take 2 tablets (200 mg total) by mouth every morning. 180 tablet 3   spironolactone  (ALDACTONE ) 50 MG tablet Take 1 tablet (50 mg total) by mouth 2 (two) times daily. 180 tablet 2   SUMAtriptan  (IMITREX ) 100 MG tablet Take 1 tablet (100  mg total) by mouth every 2 (two) hours as needed for migraine. May repeat in 2 hours if headache persists or recurs. 10 tablet 11   SYNTHROID  200 MCG tablet Take 1 tablet (200 mcg total) by mouth daily before breakfast. 90 tablet 1   tacrolimus  (PROGRAF ) 1 MG capsule Open two capsules and dissolve in 500mL (0.5 liters) of water . Label well and keep in fridge. Swish and spit 10mL twice a day. 30 capsule 2   tirzepatide  (MOUNJARO ) 15 MG/0.5ML Pen Inject 15 mg into the skin once a week. 6 mL 2   traMADol  (ULTRAM ) 50 MG tablet Take 1 tablet (50 mg total) by mouth daily as needed for moderate pain (pain score 4-6). 30 tablet 0   traMADol  (ULTRAM ) 50 MG tablet Take 1 tablet every 6-8 hours by oral route as needed for 14 days, for pain. 56 tablet 0   traZODone  (DESYREL ) 100 MG tablet Take 1.5 tablets (150 mg total) by mouth at bedtime. 135 tablet 1   Current Facility-Administered Medications  Medication Dose Route Frequency Provider Last Rate Last Admin    botulinum toxin Type A  (BOTOX ) injection 155 Units  155 Units Intramuscular Once Yan, Yijun, MD        Medication Side Effects: None  Allergies: Allergies[1]  Past Medical History:  Diagnosis Date   Acanthosis nigricans, acquired 02/09/2015   Allergy ?   Anemia    Anxiety and depression 10/31/2014   Arthritis    back, knees, right elbow   Arthritis of left hip 06/25/2018   Bilateral swelling of feet and ankles    Bipolar disorder (HCC)    Borderline personality disorder (HCC)    Cancer (HCC)    oral lichen planus, uses mouth wash, cream   Carpal tunnel syndrome 09/06/2015   Chewing difficulty    Chronic left shoulder pain 05/17/2020   Constipation    Coronary artery disease 03/21/2021   COVID 10/08/2021   Cystic teratoma    BENIGN   Dental crowns present    Diabetes mellitus without complication (HCC)    Diabetes mellitus, type 2 (HCC) 06/10/2013   Diarrhea    Diverticulosis of colon without hemorrhage    DOE (dyspnea on exertion) 03/21/2021   Dyspepsia 12/14/2020   Essential hypertension 10/02/2007   Qualifier: Diagnosis of  By: Kassie MD, Alyce A    Facial rash 06/16/2019   Female hirsutism 02/09/2015   Food allergy    Walnuts   GAD (generalized anxiety disorder)    Gastritis and gastroduodenitis    Gastroesophageal reflux disease    Grade II internal hemorrhoids    Greater trochanteric bursitis of left hip 07/24/2018   Hemorrhoids, internal 07/18/2017   History of kidney stones    History of migraine headaches    History of posttraumatic stress disorder (PTSD)    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/2015   Hyperlipidemia    Hypothyroidism    HYPOTHYROIDISM, POSTSURGICAL 10/02/2007   Qualifier: Diagnosis of  By: Kassie MD, Sean A    IBS (irritable bowel syndrome)    Infertility associated with anovulation 02/09/2015   Insulin  resistance 02/09/2015   Joint pain    Leg swelling 09/21/2020   Lichen planus    Liver cirrhosis secondary to NASH (nonalcoholic  steatohepatitis) (HCC)    Lower back pain 07/18/2017   MDD (major depressive disorder) 06/06/2015   Migraine 10/02/2007   Morbid obesity with BMI of 40.0-44.9, adult (HCC) 06/23/2020   NASH (nonalcoholic steatohepatitis)    Nausea without vomiting    Nonalcoholic  fatty liver disease 02/09/2015   Obstructive sleep apnea 06/27/2014   Oral mucosal lesion 06/17/2019   Osteoarthritis    Other fatigue    PCOS (polycystic ovarian syndrome)    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   Qualifier: Diagnosis of  By: Kassie MD, Sean A    Trigeminal neuralgia    Unilateral primary osteoarthritis, left hip 02/10/2019   Vitamin D  deficiency     Family History  Problem Relation Age of Onset   Heart disease Father    Depression Father    Heart attack Father    Sudden death Father    Anxiety disorder Father    Bipolar disorder Father    Obesity Father    Cancer Brother        THROAT cancer   Depression Brother    Anxiety disorder Brother    Depression Sister    Breast cancer Mother    Hypertension Mother    Cancer Mother    Depression Mother    Sleep apnea Mother    Obesity Mother    Varicose Veins Mother    Depression Maternal Grandmother    Stroke Maternal Grandfather    Colon cancer Brother    Stroke Paternal Grandmother    Anxiety disorder Sister     Depression Sister    Hearing loss Sister    Diabetes Brother    Hearing loss Brother    Obesity Brother    Pancreatic cancer Neg Hx    Stomach cancer Neg Hx    Liver disease Neg Hx     Social History   Socioeconomic History   Marital status: Divorced    Spouse name: Not on file   Number of children: 0   Years of education: 16   Highest education level: Bachelor's degree (e.g., BA, AB, BS)  Occupational History   Occupation: RN-Telephone Triage Nurse    Employer: [employer]    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   Drug use: No   Sexual activity: Not Currently    Partners: Male    Birth control/protection: Post-menopausal    Comment: Divorced  Other Topics Concern   Not on file  Social History Narrative   RN - [employer]   Regular  exercise: no   Caffeine  use: 2 x daily   Right-handed.   Lives alone.   Social Drivers of Health   Tobacco Use: Medium Risk (08/06/2024)   Patient History    Smoking Tobacco Use: Former    Smokeless Tobacco Use: Never    Passive Exposure: Not on file  Financial Resource Strain: Patient Declined (06/10/2024)   Overall Financial Resource Strain (CARDIA)    Difficulty of Paying Living Expenses: Patient declined  Food Insecurity: Patient Declined (06/10/2024)   Epic    Worried About Programme Researcher, Broadcasting/film/video in the Last Year: Patient declined    Barista in the Last Year: Patient declined  Transportation Needs: Patient Declined (06/10/2024)   Epic    Lack of Transportation (Medical): Patient declined     Lack of Transportation (Non-Medical): Patient declined  Physical Activity: Inactive (06/10/2024)   Exercise Vital Sign    Days of Exercise per Week: 0 days    Minutes of Exercise per Session: Not on file  Stress: Stress Concern Present (06/10/2024)   [employer] of Occupational Health - Occupational Stress Questionnaire    Feeling of Stress: Very much  Social Connections: Unknown (06/10/2024)   Social Connection and Isolation Panel    Frequency of Communication with Friends and Family: Twice a week    Frequency of Social Gatherings with Friends and Family: Patient declined    Attends Religious Services: Patient declined    Database Administrator or Organizations: Yes    Attends Banker Meetings: Patient declined    Marital Status: Divorced  Catering Manager Violence: Not on file  Depression (PHQ2-9): High Risk (07/06/2024)   Depression (PHQ2-9)    PHQ-2 Score: 14  Alcohol Screen: Not on file  Housing: Unknown (06/10/2024)   Epic    Unable to Pay for Housing in the Last Year: Patient declined    Number of Times Moved in the Last Year: Not on file    Homeless in the Last Year: No  Utilities: Not on file  Health Literacy: Not on file    Past Medical History, Surgical history, Social history, and Family history were reviewed and updated as appropriate.   Please see review of systems for further details on the patient's review from today.   Objective:   Physical Exam:  LMP 08/24/2020   Physical Exam Constitutional:      General: She is not in acute distress. Musculoskeletal:        General: No deformity.  Neurological:     Mental Status: She is alert and oriented to person, place, and time.     Coordination: Coordination normal.  Psychiatric:        Attention and Perception: Attention and perception normal. She does not perceive auditory or visual hallucinations.        Mood and Affect: Mood is anxious and depressed. Affect is not labile, blunt, angry or  inappropriate.        Speech: Speech normal.        Behavior: Behavior normal.        Thought Content: Thought content normal. Thought content is not paranoid or delusional. Thought content does not include homicidal or suicidal ideation. Thought content does not include homicidal or suicidal plan.        Cognition and Memory: Cognition and memory normal.        Judgment: Judgment normal.     Comments: Insight intact     Lab Review:  Component Value Date/Time   NA 142 07/06/2024 0912   K 3.8 07/06/2024 0912   CL 105 07/06/2024 0912   CO2 23 07/06/2024 0912   GLUCOSE 79 07/06/2024 0912   GLUCOSE 91 08/15/2023 1004   BUN 10 07/06/2024 0912   CREATININE 0.89 07/06/2024 0912   CREATININE 0.95 11/28/2022 1509   CALCIUM  9.5 07/06/2024 0912   PROT 6.5 07/06/2024 0912   ALBUMIN 4.2 07/06/2024 0912   AST 19 07/06/2024 0912   ALT 12 07/06/2024 0912   ALKPHOS 97 07/06/2024 0912   BILITOT 0.4 07/06/2024 0912   GFRNONAA >60 08/15/2023 1004   GFRNONAA 99 12/26/2020 0000   GFRAA 115 12/26/2020 0000       Component Value Date/Time   WBC 4.5 07/06/2024 0912   WBC 4.9 08/15/2023 1004   RBC 4.76 07/06/2024 0912   RBC 4.49 08/15/2023 1004   HGB 14.2 07/06/2024 0912   HCT 44.2 07/06/2024 0912   PLT 186 07/06/2024 0912   MCV 93 07/06/2024 0912   MCH 29.8 07/06/2024 0912   MCH 30.7 08/15/2023 1004   MCHC 32.1 07/06/2024 0912   MCHC 33.9 08/15/2023 1004   RDW 13.7 07/06/2024 0912   LYMPHSABS 1.7 07/06/2024 0912   MONOABS 0.3 08/15/2023 1004   EOSABS 0.0 07/06/2024 0912   BASOSABS 0.0 07/06/2024 0912    No results found for: POCLITH, LITHIUM   No results found for: PHENYTOIN, PHENOBARB, VALPROATE, CBMZ   .res Assessment: Plan:   Plan:  PDMP reviewed  Continue: Risperdal  2mg  at hs  Risperdal  1mg  in the morning  Lamictal  - 2 - 200mg  daily Zoloft  100mg  - 2 daily  Nuvigil  200mg  daily   Clonazepam  1mg  TID as needed for anxiety  Hydroxyzine  25mg  TID - using  1 tablet at bedtime.  Trazadone 150mg  - take one tablet at bedtime    Continue therapy Donny Payor   Patient out of work 05/12/2024 through 08/12/2024. She may return to work on 08/13/2024 without restrictions.  RTC 4 weeks  25 minutes spent dedicated to the care of this patient on the date of this encounter to include pre-visit review of records, ordering of medication, post visit documentation, and face-to-face time with the patient discussing MDD, GAD, PTSD, and panic attacks. Discussed increasing Trazadone to help with sleep.   Patient advised to contact office with any questions, adverse effects, or acute worsening in signs and symptoms.  Counseled patient regarding potential benefits, risks, and side effects of Lamictal  to include potential risk of Stevens-Johnson syndrome. Advised patient to stop taking Lamictal  and contact office immediately if rash develops and to seek urgent medical attention if rash is severe and/or spreading quickly.   Discussed potential benefits, risk, and side effects of benzodiazepines to include potential risk of tolerance and dependence, as well as possible drowsiness. Advised patient not to drive if experiencing drowsiness and to take lowest possible effective dose to minimize risk of dependence and tolerance.  Discussed potential metabolic side effects associated with atypical antipsychotics, as well as potential risk for movement side effects. Advised pt to contact office if movement side effects occur.    Diagnoses and all orders for this visit:  Severe episode of recurrent major depressive disorder, without psychotic features (HCC)  PTSD (post-traumatic stress disorder)  GAD (generalized anxiety disorder)  Panic attacks     Please see After Visit Summary for patient specific instructions.  Future Appointments  Date Time Provider Department Center  09/07/2024  8:20 AM Sirivol, Mamatha, MD [facility]  07/11/2025  3:00 PM Onita Duos,  MD [facility] None    No orders of the defined types were placed in this encounter.     -------------------------------      [1]  Allergies Allergen Reactions   Bupropion Rash, Dermatitis, Hives, Itching and Swelling    bupropion   Sulfa Antibiotics Itching, Rash, Swelling and Dermatitis   Sulfamethoxazole-Trimethoprim Rash, Dermatitis and Swelling    sulfamethoxazole / trimethoprim   Sulfasalazine Dermatitis, Itching and Swelling   Walnut Itching   Food     Walnut-mouth swelling/itching   Tilactase Swelling    Walnut-mouth swelling/itching  alpha-D-galactosidase enzyme  lactase   Bupropion Hcl Itching and Rash   Codeine Itching

## 2024-08-06 NOTE — Telephone Encounter (Signed)
 Let me check her file for that form

## 2024-08-06 NOTE — Telephone Encounter (Signed)
 Tracy [name] had an appt with gina today. She said that in the original packet there is a return to work form. That needs to be filled out. She is returning to work next Friday the 9th with no restricitions

## 2024-08-13 ENCOUNTER — Ambulatory Visit: Payer: Self-pay | Admitting: Pharmacist

## 2024-08-13 ENCOUNTER — Other Ambulatory Visit: Payer: Self-pay

## 2024-08-13 ENCOUNTER — Other Ambulatory Visit (HOSPITAL_COMMUNITY): Payer: Self-pay

## 2024-08-13 NOTE — Progress Notes (Unsigned)
 01/09 - Patient wants to wait to start packs - will check in February again - ST  Clonazepam  Lyrica 

## 2024-08-17 ENCOUNTER — Other Ambulatory Visit (HOSPITAL_COMMUNITY): Payer: Self-pay

## 2024-08-17 ENCOUNTER — Other Ambulatory Visit: Payer: Self-pay

## 2024-08-17 MED ORDER — HYDROCODONE-ACETAMINOPHEN 5-325 MG PO TABS
1.0000 | ORAL_TABLET | Freq: Four times a day (QID) | ORAL | 0 refills | Status: AC | PRN
  Filled 2024-08-17: qty 28, 7d supply, fill #0

## 2024-08-17 MED ORDER — CYCLOBENZAPRINE HCL 10 MG PO TABS
10.0000 mg | ORAL_TABLET | Freq: Three times a day (TID) | ORAL | 0 refills | Status: AC | PRN
  Filled 2024-08-17: qty 30, 10d supply, fill #0

## 2024-08-18 ENCOUNTER — Other Ambulatory Visit: Payer: Self-pay | Admitting: Family Medicine

## 2024-08-18 ENCOUNTER — Other Ambulatory Visit: Payer: Self-pay

## 2024-08-19 ENCOUNTER — Other Ambulatory Visit: Payer: Self-pay

## 2024-08-19 ENCOUNTER — Other Ambulatory Visit: Payer: Self-pay | Admitting: Family Medicine

## 2024-08-19 ENCOUNTER — Other Ambulatory Visit: Payer: Self-pay | Admitting: Adult Health

## 2024-08-19 ENCOUNTER — Telehealth (HOSPITAL_COMMUNITY): Payer: Self-pay

## 2024-08-19 ENCOUNTER — Encounter: Payer: Self-pay | Admitting: Pharmacist

## 2024-08-19 ENCOUNTER — Other Ambulatory Visit (HOSPITAL_COMMUNITY): Payer: Self-pay

## 2024-08-19 DIAGNOSIS — F411 Generalized anxiety disorder: Secondary | ICD-10-CM

## 2024-08-19 MED ORDER — FUROSEMIDE 40 MG PO TABS
40.0000 mg | ORAL_TABLET | Freq: Every day | ORAL | 0 refills | Status: AC
  Filled 2024-08-19: qty 90, 90d supply, fill #0

## 2024-08-19 MED ORDER — LISINOPRIL 5 MG PO TABS
5.0000 mg | ORAL_TABLET | Freq: Every day | ORAL | 2 refills | Status: AC
  Filled 2024-08-19: qty 90, 90d supply, fill #0

## 2024-08-19 MED ORDER — PANTOPRAZOLE SODIUM 40 MG PO TBEC
80.0000 mg | DELAYED_RELEASE_TABLET | Freq: Two times a day (BID) | ORAL | 3 refills | Status: AC
  Filled 2024-08-19: qty 60, 15d supply, fill #0
  Filled 2024-09-08: qty 60, 15d supply, fill #1

## 2024-08-19 MED ORDER — SYNTHROID 200 MCG PO TABS
200.0000 ug | ORAL_TABLET | Freq: Every day | ORAL | 1 refills | Status: AC
  Filled 2024-08-19: qty 90, 90d supply, fill #0

## 2024-08-19 NOTE — Telephone Encounter (Signed)
 Pharmacy Patient Advocate Encounter  Received notification from [facility] that Prior Authorization for Dexcom G7 Sensor  has been APPROVED from 08/19/24 to 08/18/25. Ran test claim, Copay is $69.89. This test claim was processed through [facility]- copay amounts may vary at other pharmacies due to pharmacy/plan contracts, or as the patient moves through the different stages of their insurance plan.   PA #/Case ID/Reference #: [id number]

## 2024-08-20 ENCOUNTER — Other Ambulatory Visit (HOSPITAL_COMMUNITY): Payer: Self-pay

## 2024-08-20 MED ORDER — HYDROXYZINE HCL 25 MG PO TABS
25.0000 mg | ORAL_TABLET | Freq: Three times a day (TID) | ORAL | 0 refills | Status: AC
  Filled 2024-08-20 – 2024-09-08 (×3): qty 270, 90d supply, fill #0

## 2024-08-26 ENCOUNTER — Other Ambulatory Visit: Payer: Self-pay

## 2024-08-27 ENCOUNTER — Other Ambulatory Visit: Payer: Self-pay

## 2024-09-01 ENCOUNTER — Encounter: Payer: Self-pay | Admitting: Orthopedic Surgery

## 2024-09-01 ENCOUNTER — Ambulatory Visit: Payer: Self-pay

## 2024-09-01 ENCOUNTER — Other Ambulatory Visit: Payer: Self-pay | Admitting: Orthopedic Surgery

## 2024-09-01 DIAGNOSIS — M542 Cervicalgia: Secondary | ICD-10-CM

## 2024-09-01 NOTE — Telephone Encounter (Signed)
 FYI Only or Action Required?: FYI only for provider: ED advised.  Patient was last seen in primary care on 07/06/2024 by Sirivol, Mamatha, MD.  Called Nurse Triage reporting Fall and Shoulder Injury.  Symptoms began several days ago.  Interventions attempted: Nothing.  Symptoms are: unchanged.  Triage Disposition: Go to ED Now (Notify PCP)  Patient/caregiver understands and will follow disposition?: Yes Message from [place] [facility] sent at 09/01/2024  8:53 AM EST  Reason for Triage: Patient had fall on Sunday and is in pain ( both shoulders)     Reason for Disposition  Weakness of an arm or hand  Answer Assessment - Initial Assessment Questions Advised ED now. Patient reports will go to [facility] ED; someone will drive.  Advised 911 if symptoms worsen. Patient verbalized understanding.   1. ONSET: When did the pain begin?      Sunday 2. LOCATION: Where does it hurt?      Bilateral shoulders, left side of neck 3. PATTERN Does the pain come and go, or has it been constant since it started?      constant 4. SEVERITY: How bad is the pain?  (Scale 0-10; or none or slight stiffness, mild, moderate, severe)     8/10 5. RADIATION: Does the pain go anywhere else, shoot into your arms?     no 6. CORD SYMPTOMS: Any weakness or numbness of the arms or legs?     Arms weakness 7. CAUSE: What do you think is causing the neck pain?     Slipped and fell hit left side of head, Arms weak crunching,  sound in base of neck, Movement to left neck hurt  8. NECK OVERUSE: Any recent activities that involved turning or twisting the neck?     Sx;neck infusion, hardware 9. OTHER SYMPTOMS: Do you have any other symptoms? (e.g., headache, fever, chest pain, difficulty breathing, neck swelling)     Denies diff breathing, chest pain, numbness, fever  Protocols used: Neck Pain or Stiffness-A-AH

## 2024-09-03 ENCOUNTER — Ambulatory Visit
Admission: RE | Admit: 2024-09-03 | Discharge: 2024-09-03 | Disposition: A | Source: Ambulatory Visit | Attending: Orthopedic Surgery | Admitting: Orthopedic Surgery

## 2024-09-03 DIAGNOSIS — M542 Cervicalgia: Secondary | ICD-10-CM

## 2024-09-07 ENCOUNTER — Ambulatory Visit

## 2024-09-08 ENCOUNTER — Other Ambulatory Visit (HOSPITAL_COMMUNITY): Payer: Self-pay

## 2024-09-08 ENCOUNTER — Other Ambulatory Visit: Payer: Self-pay

## 2025-07-11 ENCOUNTER — Ambulatory Visit: Admitting: Neurology
